# Patient Record
Sex: Female | Born: 1945
Health system: Southern US, Community
[De-identification: ages and names within clinical notes are randomized; demographics above are authoritative.]

## PROBLEM LIST (undated history)

## (undated) DIAGNOSIS — L570 Actinic keratosis: Secondary | ICD-10-CM

## (undated) DIAGNOSIS — K219 Gastro-esophageal reflux disease without esophagitis: Secondary | ICD-10-CM

## (undated) DIAGNOSIS — G8929 Other chronic pain: Secondary | ICD-10-CM

## (undated) DIAGNOSIS — E785 Hyperlipidemia, unspecified: Secondary | ICD-10-CM

## (undated) DIAGNOSIS — I10 Essential (primary) hypertension: Secondary | ICD-10-CM

## (undated) DIAGNOSIS — N289 Disorder of kidney and ureter, unspecified: Secondary | ICD-10-CM

## (undated) HISTORY — PX: ABDOMINAL HYSTERECTOMY: SHX81

## (undated) HISTORY — DX: Gastro-esophageal reflux disease without esophagitis: K21.9

## (undated) HISTORY — PX: CAROTID ARTERY ANGIOPLASTY: SHX1300

## (undated) HISTORY — PX: COLONOSCOPY: SHX174

## (undated) HISTORY — PX: AUGMENTATION MAMMAPLASTY: SUR837

## (undated) HISTORY — DX: Other chronic pain: G89.29

## (undated) HISTORY — PX: FRACTURE SURGERY: SHX138

## (undated) HISTORY — PX: APPENDECTOMY: SHX54

## (undated) HISTORY — PX: TUBAL LIGATION: SHX77

## (undated) HISTORY — DX: Essential (primary) hypertension: I10

## (undated) HISTORY — PX: ELBOW SURGERY: SHX618

## (undated) HISTORY — DX: Actinic keratosis: L57.0

## (undated) HISTORY — PX: TONSILLECTOMY: SUR1361

## (undated) HISTORY — DX: Hyperlipidemia, unspecified: E78.5

## (undated) HISTORY — PX: COSMETIC SURGERY: SHX468

---

## 1999-05-23 HISTORY — PX: CAROTID ENDARTERECTOMY: SUR193

## 2013-03-18 LAB — HM COLONOSCOPY

## 2017-05-28 DIAGNOSIS — S6991XA Unspecified injury of right wrist, hand and finger(s), initial encounter: Secondary | ICD-10-CM | POA: Diagnosis not present

## 2017-05-28 DIAGNOSIS — M151 Heberden's nodes (with arthropathy): Secondary | ICD-10-CM | POA: Diagnosis not present

## 2017-05-28 DIAGNOSIS — Z0000 Encounter for general adult medical examination without abnormal findings: Secondary | ICD-10-CM | POA: Diagnosis not present

## 2017-05-28 DIAGNOSIS — N179 Acute kidney failure, unspecified: Secondary | ICD-10-CM | POA: Diagnosis not present

## 2017-05-28 DIAGNOSIS — I119 Hypertensive heart disease without heart failure: Secondary | ICD-10-CM | POA: Diagnosis not present

## 2017-05-28 DIAGNOSIS — N183 Chronic kidney disease, stage 3 (moderate): Secondary | ICD-10-CM | POA: Diagnosis not present

## 2017-05-29 DIAGNOSIS — N183 Chronic kidney disease, stage 3 (moderate): Secondary | ICD-10-CM | POA: Diagnosis not present

## 2017-05-29 DIAGNOSIS — I119 Hypertensive heart disease without heart failure: Secondary | ICD-10-CM | POA: Diagnosis not present

## 2017-09-24 DIAGNOSIS — H35033 Hypertensive retinopathy, bilateral: Secondary | ICD-10-CM | POA: Diagnosis not present

## 2017-09-24 DIAGNOSIS — Z961 Presence of intraocular lens: Secondary | ICD-10-CM | POA: Diagnosis not present

## 2017-09-24 DIAGNOSIS — H43811 Vitreous degeneration, right eye: Secondary | ICD-10-CM | POA: Diagnosis not present

## 2017-09-25 DIAGNOSIS — I119 Hypertensive heart disease without heart failure: Secondary | ICD-10-CM | POA: Diagnosis not present

## 2017-09-25 DIAGNOSIS — N183 Chronic kidney disease, stage 3 (moderate): Secondary | ICD-10-CM | POA: Diagnosis not present

## 2017-09-25 DIAGNOSIS — E559 Vitamin D deficiency, unspecified: Secondary | ICD-10-CM | POA: Diagnosis not present

## 2017-09-25 DIAGNOSIS — N179 Acute kidney failure, unspecified: Secondary | ICD-10-CM | POA: Diagnosis not present

## 2017-10-11 DIAGNOSIS — L298 Other pruritus: Secondary | ICD-10-CM | POA: Diagnosis not present

## 2017-10-11 DIAGNOSIS — L820 Inflamed seborrheic keratosis: Secondary | ICD-10-CM | POA: Diagnosis not present

## 2017-10-11 DIAGNOSIS — L538 Other specified erythematous conditions: Secondary | ICD-10-CM | POA: Diagnosis not present

## 2017-10-11 DIAGNOSIS — L821 Other seborrheic keratosis: Secondary | ICD-10-CM | POA: Diagnosis not present

## 2017-10-19 DIAGNOSIS — H6123 Impacted cerumen, bilateral: Secondary | ICD-10-CM | POA: Diagnosis not present

## 2017-11-05 DIAGNOSIS — Z1231 Encounter for screening mammogram for malignant neoplasm of breast: Secondary | ICD-10-CM | POA: Diagnosis not present

## 2017-11-15 DIAGNOSIS — H61303 Acquired stenosis of external ear canal, unspecified, bilateral: Secondary | ICD-10-CM | POA: Diagnosis not present

## 2017-11-15 DIAGNOSIS — H900 Conductive hearing loss, bilateral: Secondary | ICD-10-CM | POA: Diagnosis not present

## 2017-11-15 DIAGNOSIS — H6123 Impacted cerumen, bilateral: Secondary | ICD-10-CM | POA: Diagnosis not present

## 2017-11-27 DIAGNOSIS — R928 Other abnormal and inconclusive findings on diagnostic imaging of breast: Secondary | ICD-10-CM | POA: Diagnosis not present

## 2017-11-27 DIAGNOSIS — R922 Inconclusive mammogram: Secondary | ICD-10-CM | POA: Diagnosis not present

## 2017-11-29 DIAGNOSIS — H6123 Impacted cerumen, bilateral: Secondary | ICD-10-CM | POA: Diagnosis not present

## 2017-11-30 DIAGNOSIS — L237 Allergic contact dermatitis due to plants, except food: Secondary | ICD-10-CM | POA: Diagnosis not present

## 2018-01-03 DIAGNOSIS — N183 Chronic kidney disease, stage 3 (moderate): Secondary | ICD-10-CM | POA: Diagnosis not present

## 2018-01-03 LAB — COMPREHENSIVE METABOLIC PANEL
Albumin: 4.2 (ref 3.5–5.0)
Calcium: 10 (ref 8.7–10.7)
GFR calc non Af Amer: 43.1

## 2018-01-03 LAB — TSH
TSH: 2.68 (ref 0.41–5.90)
TSH: 2.68 (ref 0.41–5.90)

## 2018-01-03 LAB — BASIC METABOLIC PANEL
BUN: 19 (ref 4–21)
BUN: 19 (ref 4–21)
CO2: 31 — AB (ref 13–22)
Chloride: 104 (ref 99–108)
Creatinine: 1.3 — AB (ref 0.5–1.1)
Creatinine: 1.3 — AB (ref 0.5–1.1)
Glucose: 106
Glucose: 106
Potassium: 4.8 (ref 3.4–5.3)
Potassium: 4.8 (ref 3.4–5.3)
Sodium: 146 (ref 137–147)
Sodium: 146 (ref 137–147)

## 2018-01-03 LAB — HEPATIC FUNCTION PANEL
ALT: 20 (ref 7–35)
ALT: 22 (ref 7–35)
AST: 29 (ref 13–35)
AST: 29 (ref 13–35)
Alkaline Phosphatase: 104 (ref 25–125)
Bilirubin, Total: 0.3
Bilirubin, Total: 0.3

## 2018-01-03 LAB — CBC AND DIFFERENTIAL
Hemoglobin: 14.3 (ref 12.0–16.0)
Hemoglobin: 14.3 (ref 12.0–16.0)

## 2018-01-03 LAB — VITAMIN D 25 HYDROXY (VIT D DEFICIENCY, FRACTURES)
Vit D, 25-Hydroxy: 40.9
Vit D, 25-Hydroxy: 40.9

## 2018-01-09 DIAGNOSIS — I119 Hypertensive heart disease without heart failure: Secondary | ICD-10-CM | POA: Diagnosis not present

## 2018-01-09 DIAGNOSIS — N179 Acute kidney failure, unspecified: Secondary | ICD-10-CM | POA: Diagnosis not present

## 2018-01-09 DIAGNOSIS — N183 Chronic kidney disease, stage 3 (moderate): Secondary | ICD-10-CM | POA: Diagnosis not present

## 2018-01-09 DIAGNOSIS — G894 Chronic pain syndrome: Secondary | ICD-10-CM | POA: Diagnosis not present

## 2018-05-30 DIAGNOSIS — L570 Actinic keratosis: Secondary | ICD-10-CM | POA: Diagnosis not present

## 2018-05-30 DIAGNOSIS — L578 Other skin changes due to chronic exposure to nonionizing radiation: Secondary | ICD-10-CM | POA: Diagnosis not present

## 2018-05-30 DIAGNOSIS — Z1283 Encounter for screening for malignant neoplasm of skin: Secondary | ICD-10-CM | POA: Diagnosis not present

## 2018-05-30 DIAGNOSIS — D229 Melanocytic nevi, unspecified: Secondary | ICD-10-CM | POA: Diagnosis not present

## 2018-06-04 ENCOUNTER — Ambulatory Visit: Payer: Medicare Other | Admitting: Nurse Practitioner

## 2018-06-06 ENCOUNTER — Ambulatory Visit
Admission: RE | Admit: 2018-06-06 | Discharge: 2018-06-06 | Disposition: A | Discharge status: Home / Self Care | Payer: Medicare Other | Source: Ambulatory Visit | Attending: Nurse Practitioner | Admitting: Nurse Practitioner

## 2018-06-06 ENCOUNTER — Ambulatory Visit: Payer: Medicare Other | Admitting: Nurse Practitioner

## 2018-06-06 ENCOUNTER — Encounter: Payer: Self-pay | Admitting: Nurse Practitioner

## 2018-06-06 ENCOUNTER — Other Ambulatory Visit: Payer: Self-pay

## 2018-06-06 VITALS — BP 141/67 | HR 67 | Temp 97.7°F | Ht 63.0 in | Wt 130.0 lb

## 2018-06-06 DIAGNOSIS — G8929 Other chronic pain: Secondary | ICD-10-CM

## 2018-06-06 DIAGNOSIS — M533 Sacrococcygeal disorders, not elsewhere classified: Secondary | ICD-10-CM | POA: Insufficient documentation

## 2018-06-06 DIAGNOSIS — M79602 Pain in left arm: Secondary | ICD-10-CM

## 2018-06-06 DIAGNOSIS — Z79899 Other long term (current) drug therapy: Secondary | ICD-10-CM

## 2018-06-06 DIAGNOSIS — M79605 Pain in left leg: Secondary | ICD-10-CM | POA: Insufficient documentation

## 2018-06-06 DIAGNOSIS — Z79891 Long term (current) use of opiate analgesic: Secondary | ICD-10-CM | POA: Insufficient documentation

## 2018-06-06 DIAGNOSIS — Z789 Other specified health status: Secondary | ICD-10-CM | POA: Diagnosis not present

## 2018-06-06 DIAGNOSIS — G894 Chronic pain syndrome: Secondary | ICD-10-CM | POA: Insufficient documentation

## 2018-06-06 DIAGNOSIS — M899 Disorder of bone, unspecified: Secondary | ICD-10-CM | POA: Insufficient documentation

## 2018-06-06 DIAGNOSIS — S4992XA Unspecified injury of left shoulder and upper arm, initial encounter: Secondary | ICD-10-CM | POA: Diagnosis not present

## 2018-06-06 DIAGNOSIS — M47816 Spondylosis without myelopathy or radiculopathy, lumbar region: Secondary | ICD-10-CM | POA: Diagnosis not present

## 2018-06-06 DIAGNOSIS — M25512 Pain in left shoulder: Secondary | ICD-10-CM | POA: Diagnosis not present

## 2018-06-06 NOTE — Patient Instructions (Signed)

## 2018-06-06 NOTE — Progress Notes (Signed)
Patient's Name: Dawn Bruce  MRN: [id number]  Referring Provider: Loney Hering*  DOB: [birth year]  PCP: [facility]  DOS: 06/06/2018  Note by: Dionisio David NP  Service setting: Ambulatory outpatient  Specialty: Interventional Pain Management  Location: [facility] (AMB) Pain Management Facility    Patient type: New Patient    Primary Reason(s) for Visit: Initial Patient Evaluation CC: Arm Pain (left)  HPI  Ms. Bartus is a 73 y.o. year old, female patient, who comes today for an initial evaluation. She has Chronic pain of left upper extremity (Primary Area of Pain); Chronic left sacroiliac joint pain (Secondary Area of Pain); Chronic pain of left lower extremity (Tertiary Area of Pain); Chronic pain syndrome; Long term current use of opiate analgesic; Pharmacologic therapy; Disorder of skeletal system; and Problems influencing health status on their problem list.. Her primarily concern today is the Arm Pain (left)  Pain Assessment: Location: Left Arm(left) Radiating: pain radiaties down to fingers Onset: More than a month ago Duration: Chronic pain Quality: Dull, Aching, Burning, Throbbing, Constant Severity: 2 /10 (subjective, self-reported pain score)  Note: Reported level is compatible with observation.                          Effect on ADL: limts my daily activities Timing: Constant Modifying factors: medication and patches BP: (!) 141/67  HR: 67  Onset and Duration: Date of onset: 01/20/2005 and Present longer than 3 months Cause of pain: Work related accident or event Severity: Getting better, NAS-11 at its worse: 8/10, NAS-11 at its best: 2/10, NAS-11 now: 2/10 and NAS-11 on the average: 2/10 Timing: Not influenced by the time of the day and During activity or exercise Aggravating Factors: Prolonged sitting, Prolonged standing and Walking uphill Alleviating Factors: Medications Associated Problems: Night-time cramps, Numbness, Sweating, Temperature changes and  Pain that does not allow patient to sleep Quality of Pain: Burning and Constant Previous Examinations or Tests: X-rays, Nerve conduction test and Orthopedic evaluation Previous Treatments: Narcotic medications, Physical Therapy and Pool exercises  The patient comes into the clinics today for the first time for a chronic pain management evaluation.  According to the patient her primary area of pain is in her left forearm.  She admits this is related to an accident that she suffered 14 years ago.  She admits that she did have multiple surgeries on her arm.  She was seen at [facility] pain management initially prescribed Lyrica which was effective.  She later moved to [facility] and was started on fentanyl patches.  She admits that she has numbness and tingling with weakness that goes to all of her fingers.  Her second area of pain is in her left buttocks.  She feels like this pain has gotten worse over the last month.  She denies any previous surgery, interventional therapy physical therapy or recent images.  Her third area of pain is in her left leg.  She admits that she has numbness and tingling that goes all the way down the back of her leg into her whole foot and affects all of her toes.  She denies weakness.  She denies a nerve conduction study.  Today I took the time to provide the patient with information regarding this pain practice. The patient was informed that the practice is divided into two sections: an interventional pain management section, as well as a completely separate and distinct medication management section. I explained that there are procedure days  for interventional therapies, and evaluation days for follow-ups and medication management. Because of the amount of documentation required during both, they are kept separated. This means that there is the possibility that she may be scheduled for a procedure on one day, and medication management the next. I have also informed her that because  of staffing and facility limitations, this practice will no longer take patients for medication management only. To illustrate the reasons for this, I gave the patient the example of surgeons, and how inappropriate it would be to refer a patient to his/her care, just to write for the post-surgical antibiotics on a surgery done by a different surgeon.   Because interventional pain management is part of the board-certified specialty for the doctors, the patient was informed that joining this practice means that they are open to any and all interventional therapies. I made it clear that this does not mean that they will be forced to have any procedures done. What this means is that I believe interventional therapies to be essential part of the diagnosis and proper management of chronic pain conditions. Therefore, patients not interested in these interventional alternatives will be better served under the care of a different practitioner.  The patient was also made aware of my Comprehensive Pain Management Safety Guidelines where by joining this practice, they limit all of their nerve blocks and joint injections to those done by our practice, for as long as we are retained to manage their care. Historic Controlled Substance Pharmacotherapy Review  PMP and historical list of controlled substances: Fentanyl 50 mcg patch, pregabalin 150 mg, zolpidem 10 mg Highest opioid analgesic regimen found: Fentanyl 50 mcg patch every 72 hours (last fill date 05/31/2018) Most recent opioid analgesic: Fentanyl 50 mcg patch every 72 hours (last fill date 05/31/2018) Current opioid analgesics:Fentanyl 50 mcg patch every 72 hours (last fill date 05/31/2018) Highest recorded MME/day: 120 mg/day MME/day: 120 mg/day Medications: The patient did not bring the medication(s) to the appointment, as requested in our "New Patient Package" Pharmacodynamics: Desired effects: Analgesia: The patient reports >50% benefit. Reported  improvement in function: The patient reports medication allows her to accomplish basic ADLs. Clinically meaningful improvement in function (CMIF): Sustained CMIF goals met Perceived effectiveness: Described as relatively effective, allowing for increase in activities of daily living (ADL) Undesirable effects: Side-effects or Adverse reactions: None reported Historical Monitoring: The patient  has no history on file for drug. List of all UDS Test(s): No results found for: MDMA, COCAINSCRNUR, PCPSCRNUR, PCPQUANT, CANNABQUANT, THCU, [place] List of all Serum Drug Screening Test(s):  No results found for: AMPHSCRSER, BARBSCRSER, BENZOSCRSER, COCAINSCRSER, PCPSCRSER, PCPQUANT, THCSCRSER, CANNABQUANT, OPIATESCRSER, OXYSCRSER, PROPOXSCRSER Historical Background Evaluation: [place] PDMP: Six (6) year initial data search conducted.             [place] Department of public safety, offender search: Editor, commissioning Information) Non-contributory Risk Assessment Profile: Aberrant behavior: None observed or detected today Risk factors for fatal opioid overdose: None identified today Fatal overdose hazard ratio (HR): Calculation deferred Non-fatal overdose hazard ratio (HR): Calculation deferred Risk of opioid abuse or dependence: 0.7-3.0% with doses ? 36 MME/day and 6.1-26% with doses ? 120 MME/day. Substance use disorder (SUD) risk level: Pending results of Medical Psychology Evaluation for SUD Opioid risk tool (ORT) (Total Score): 0  ORT Scoring interpretation table:  Score <3 = Low Risk for SUD  Score between 4-7 = Moderate Risk for SUD  Score >8 = High Risk for Opioid Abuse   PHQ-2 Depression Scale:  Total  score:    PHQ-2 Scoring interpretation table: (Score and probability of major depressive disorder)  Score 0 = No depression  Score 1 = 15.4% Probability  Score 2 = 21.1% Probability  Score 3 = 38.4% Probability  Score 4 = 45.5% Probability  Score 5 = 56.4% Probability  Score 6 = 78.6% Probability   PHQ-9  Depression Scale:  Total score:    PHQ-9 Scoring interpretation table:  Score 0-4 = No depression  Score 5-9 = Mild depression  Score 10-14 = Moderate depression  Score 15-19 = Moderately severe depression  Score 20-27 = Severe depression (2.4 times higher risk of SUD and 2.89 times higher risk of overuse)   Pharmacologic Plan: Pending ordered tests and/or consults  Meds  The patient has a current medication list which includes the following prescription(s): aspirin, atorvastatin, diltiazem, escitalopram, fentanyl, omeprazole, and pregabalin.  Current Outpatient Medications on File Prior to Visit  Medication Sig  . aspirin 81 MG chewable tablet Chew by mouth daily.  Marland Kitchen atorvastatin (LIPITOR) 40 MG tablet Take 40 mg by mouth daily.  Marland Kitchen diltiazem (CARDIZEM SR) 120 MG 12 hr capsule Take 120 mg by mouth 2 (two) times daily.  Marland Kitchen escitalopram (LEXAPRO) 10 MG tablet Take 10 mg by mouth daily.  . fentaNYL (DURAGESIC) 50 MCG/HR Place onto the skin every 3 (three) days.  Marland Kitchen omeprazole (PRILOSEC) 40 MG capsule Take 40 mg by mouth daily.  . pregabalin (LYRICA) 150 MG capsule Take 150 mg by mouth 2 (two) times daily.   No current facility-administered medications on file prior to visit.    Imaging Review  Note: No new results found.        ROS  Cardiovascular History: Daily Aspirin intake and High blood pressure Pulmonary or Respiratory History: No reported pulmonary signs or symptoms such as wheezing and difficulty taking a deep full breath (Asthma), difficulty blowing air out (Emphysema), coughing up mucus (Bronchitis), persistent dry cough, or temporary stoppage of breathing during sleep Neurological History: No reported neurological signs or symptoms such as seizures, abnormal skin sensations, urinary and/or fecal incontinence, being born with an abnormal open spine and/or a tethered spinal cord Review of Past Neurological Studies: No results found for this or any previous  visit. Psychological-Psychiatric History: No reported psychological or psychiatric signs or symptoms such as difficulty sleeping, anxiety, depression, delusions or hallucinations (schizophrenial), mood swings (bipolar disorders) or suicidal ideations or attempts Gastrointestinal History: Reflux or heatburn Genitourinary History: No reported renal or genitourinary signs or symptoms such as difficulty voiding or producing urine, peeing blood, non-functioning kidney, kidney stones, difficulty emptying the bladder, difficulty controlling the flow of urine, or chronic kidney disease Hematological History: Brusing easily Endocrine History: No reported endocrine signs or symptoms such as high or low blood sugar, rapid heart rate due to high thyroid levels, obesity or weight gain due to slow thyroid or thyroid disease Rheumatologic History: No reported rheumatological signs and symptoms such as fatigue, joint pain, tenderness, swelling, redness, heat, stiffness, decreased range of motion, with or without associated rash Musculoskeletal History: Negative for myasthenia gravis, muscular dystrophy, multiple sclerosis or malignant hyperthermia Work History: Retired  Allergies  Ms. Doane has no allergies on file.  Laboratory Chemistry  Inflammation Markers No results found for: CRP, ESRSEDRATE (CRP: Acute Phase) (ESR: Chronic Phase) Renal Function Markers No results found for: BUN, CREATININE, GFRAA, GFRNONAA Hepatic Function Markers No results found for: AST, ALT, ALBUMIN, ALKPHOS, HCVAB Electrolytes No results found for: NA, K, CL, CALCIUM, MG Neuropathy Markers  No results found for: [facility] Bone Pathology Markers No results found for: Hendricks Milo, VD125OH2TOT, [id number], [id number], 25OHVITD1, 25OHVITD2, 25OHVITD3, CALCIUM, TESTOFREE, TESTOSTERONE Coagulation Parameters No results found for: INR, LABPROT, APTT, PLT Cardiovascular Markers No results found for: BNP, HGB, HCT Note: Lab  results reviewed.  PFSH  Drug: Ms. Cartaya  has no history on file for drug. Alcohol:  has no history on file for alcohol. Tobacco:  reports that she has never smoked. She has never used smokeless tobacco. Medical:  has a past medical history of Hypertension. Family: family history includes Cancer in her father and mother.   Active Ambulatory Problems    Diagnosis Date Noted  . Chronic pain of left upper extremity (Primary Area of Pain) 06/06/2018  . Chronic left sacroiliac joint pain (Secondary Area of Pain) 06/06/2018  . Chronic pain of left lower extremity (Tertiary Area of Pain) 06/06/2018  . Chronic pain syndrome 06/06/2018  . Long term current use of opiate analgesic 06/06/2018  . Pharmacologic therapy 06/06/2018  . Disorder of skeletal system 06/06/2018  . Problems influencing health status 06/06/2018   Resolved Ambulatory Problems    Diagnosis Date Noted  . No Resolved Ambulatory Problems   Past Medical History:  Diagnosis Date  . Hypertension    Constitutional Exam  General appearance: Well nourished, well developed, and well hydrated. In no apparent acute distress Vitals:   06/06/18 0849  BP: (!) 141/67  Pulse: 67  Temp: 97.7 F (36.5 C)  SpO2: 99%  Weight: 130 lb (59 kg)  Height: 5' 3"  (1.6 m)   BMI Assessment: Estimated body mass index is 23.03 kg/m as calculated from the following:   Height as of this encounter: 5' 3"  (1.6 m).   Weight as of this encounter: 130 lb (59 kg).  BMI interpretation table: BMI level Category Range association with higher incidence of chronic pain  <18 kg/m2 Underweight   18.5-24.9 kg/m2 Ideal body weight   25-29.9 kg/m2 Overweight Increased incidence by 20%  30-34.9 kg/m2 Obese (Class I) Increased incidence by 68%  35-39.9 kg/m2 Severe obesity (Class II) Increased incidence by 136%  >40 kg/m2 Extreme obesity (Class III) Increased incidence by 254%   BMI Readings from Last 4 Encounters:  06/06/18 23.03 kg/m   Wt Readings  from Last 4 Encounters:  06/06/18 130 lb (59 kg)  Psych/Mental status: Alert, oriented x 3 (person, place, & time)       Eyes: PERLA Respiratory: No evidence of acute respiratory distress  Cervical Spine Exam  Inspection: No masses, redness, or swelling Alignment: Symmetrical Functional ROM: Unrestricted ROM      Stability: No instability detected Muscle strength & Tone: Functionally intact Sensory: Unimpaired Palpation: No palpable anomalies              Upper Extremity (UE) Exam    Side: Right upper extremity  Side: Left upper extremity  Inspection: No masses, redness, swelling, or asymmetry. No contractures  Inspection: Well-healed scar from previous surgery  Functional ROM: Unrestricted ROM          Functional ROM: Unrestricted ROM          Muscle strength & Tone: Functionally intact  Muscle strength & Tone: Normal strength (5/5)  Sensory: Unimpaired  Sensory: Unimpaired  Palpation: No palpable anomalies              Palpation: Non-tender              Specialized Test(s): Deferred  Specialized Test(s): Deferred          Thoracic Spine Exam  Inspection: No masses, redness, or swelling Alignment: Symmetrical Functional ROM: Unrestricted ROM Stability: No instability detected Sensory: Unimpaired Muscle strength & Tone: No palpable anomalies  Lumbar Spine Exam  Inspection: No masses, redness, or swelling Alignment: Symmetrical Functional ROM: Unrestricted ROM      Stability: No instability detected Muscle strength & Tone: Functionally intact Sensory: Unimpaired Palpation: No palpable anomalies       Provocative Tests: Lumbar Hyperextension and rotation test: evaluation deferred today       Patrick's Maneuver: Positive for left-sided S-I arthralgia              Gait & Posture Assessment  Ambulation: Unassisted Gait: Relatively normal for age and body habitus Posture: WNL   Lower Extremity Exam    Side: Right lower extremity  Side: Left lower extremity   Inspection: No masses, redness, swelling, or asymmetry. No contractures  Inspection: No masses, redness, swelling, or asymmetry. No contractures  Functional ROM: Unrestricted ROM          Functional ROM: Unrestricted ROM          Muscle strength & Tone: Able to Toe-walk & Heel-walk without problems  Muscle strength & Tone: Able to Toe-walk & Heel-walk without problems  Sensory: Unimpaired  Sensory: Dermatomal pain pattern  Palpation: No palpable anomalies  Palpation: No palpable anomalies   Assessment  Primary Diagnosis & Pertinent Problem List: The primary encounter diagnosis was Chronic pain of left upper extremity (Primary Area of Pain). Diagnoses of Chronic left sacroiliac joint pain (Secondary Area of Pain), Chronic pain of left lower extremity (Tertiary Area of Pain), Chronic pain syndrome, Long term current use of opiate analgesic, Pharmacologic therapy, Disorder of skeletal system, and Problems influencing health status were also pertinent to this visit.  Visit Diagnosis: 1. Chronic pain of left upper extremity (Primary Area of Pain)   2. Chronic left sacroiliac joint pain (Secondary Area of Pain)   3. Chronic pain of left lower extremity (Tertiary Area of Pain)   4. Chronic pain syndrome   5. Long term current use of opiate analgesic   6. Pharmacologic therapy   7. Disorder of skeletal system   8. Problems influencing health status    Plan of Care  Initial treatment plan:  Please be advised that as per protocol, today's visit has been an evaluation only. We have not taken over the patient's controlled substance management.  Problem-specific plan: No problem-specific Assessment & Plan notes found for this encounter.  Ordered Lab-work, Procedure(s), Referral(s), & Consult(s): Orders Placed This Encounter  Procedures  . DG Lumbar Spine Complete W/Bend  . DG Si Joints  . DG Forearm Left  . Compliance Drug Analysis, Ur  . Comp. Metabolic Panel (12)  . Magnesium  . Vitamin  B12  . Sedimentation rate  . 25-Hydroxyvitamin D Lcms D2+D3  . C-reactive protein  . Ambulatory referral to Physical Therapy   Pharmacotherapy: Medications ordered:  No orders of the defined types were placed in this encounter.  Medications administered during this visit: [name] had no medications administered during this visit.   Pharmacotherapy under consideration:  Opioid Analgesics: The patient was informed that there is no guarantee that she would be a candidate for opioid analgesics. The decision will be made following CDC guidelines. This decision will be based on the results of diagnostic studies, as well as Ms. Humann's risk profile.  Membrane stabilizer: To be determined  at a later time Muscle relaxant: To be determined at a later time NSAID: To be determined at a later time Other analgesic(s): To be determined at a later time   Interventional therapies under consideration: Ms. Frein was informed that there is no guarantee that she would be a candidate for interventional therapies. The decision will be based on the results of diagnostic studies, as well as Ms. Eastmond's risk profile.  Possible procedure(s): Diagnostic left-sided sacroiliac joint injection Diagnostic left-sided lumbar epidural steroid injection Possible left-sided sacroiliac joint radiofrequency ablation   Provider-requested follow-up: Return for 2nd Visit, w/ Dr. Dossie Arbour, medical record release.  Future Appointments  Date Time Provider Orient  06/19/2018  8:30 AM Milinda Pointer, MD [facility] None  06/20/2018  1:00 PM Geralyn Corwin, PT [facility] None  09/04/2018  2:00 PM Bacigalupo, Dionne Bucy, MD [facility] None    Primary Care Physician: [place] Location: [facility] Outpatient Pain Management Facility Note by:  Date: 06/06/2018; Time: 12:10 PM  Pain Score Disclaimer: We use the NRS-11 scale. This is a self-reported, subjective measurement of pain severity with only modest  accuracy. It is used primarily to identify changes within a particular patient. It must be understood that outpatient pain scales are significantly less accurate that those used for research, where they can be applied under ideal controlled circumstances with minimal exposure to variables. In reality, the score is likely to be a combination of pain intensity and pain affect, where pain affect describes the degree of emotional arousal or changes in action readiness caused by the sensory experience of pain. Factors such as social and work situation, setting, emotional state, anxiety levels, expectation, and prior pain experience may influence pain perception and show large inter-individual differences that may also be affected by time variables.  Patient instructions provided during this appointment: Patient Instructions   ____________________________________________________________________________________________  Appointment Policy Summary  It is our goal and responsibility to provide the medical community with assistance in the evaluation and management of patients with chronic pain. Unfortunately our resources are limited. Because we do not have an unlimited amount of time, or available appointments, we are required to closely monitor and manage their use. The following rules exist to maximize their use:  Patient's responsibilities: 1. Punctuality:  At what time should I arrive? You should be physically present in our office 30 minutes before your scheduled appointment. Your scheduled appointment is with your assigned healthcare provider. However, it takes 5-10 minutes to be "checked-in", and another 15 minutes for the nurses to do the admission. If you arrive to our office at the time you were given for your appointment, you will end up being at least 20-25 minutes late to your appointment with the provider. 2. Tardiness:  What happens if I arrive only a few minutes after my scheduled appointment  time? You will need to reschedule your appointment. The cutoff is your appointment time. This is why it is so important that you arrive at least 30 minutes before that appointment. If you have an appointment scheduled for 10:00 AM and you arrive at 10:01, you will be required to reschedule your appointment.  3. Plan ahead:  Always assume that you will encounter traffic on your way in. Plan for it. If you are dependent on a driver, make sure they understand these rules and the need to arrive early. 4. Other appointments and responsibilities:  Avoid scheduling any other appointments before or after your pain clinic appointments.  5. Be prepared:  Write  down everything that you need to discuss with your healthcare provider and give this information to the admitting nurse. Write down the medications that you will need refilled. Bring your pills and bottles (even the empty ones), to all of your appointments, except for those where a procedure is scheduled. 6. No children or pets:  Find someone to take care of them. It is not appropriate to bring them in. 7. Scheduling changes:  We request "advanced notification" of any changes or cancellations. 8. Advanced notification:  Defined as a time period of more than 24 hours prior to the originally scheduled appointment. This allows for the appointment to be offered to other patients. 9. Rescheduling:  When a visit is rescheduled, it will require the cancellation of the original appointment. For this reason they both fall within the category of "Cancellations".  10. Cancellations:  They require advanced notification. Any cancellation less than 24 hours before the  appointment will be recorded as a "No Show". 11. No Show:  Defined as an unkept appointment where the patient failed to notify or declare to the practice their intention or inability to keep the appointment.  Corrective process for repeat offenders:  1. Tardiness: Three (3) episodes of rescheduling  due to late arrivals will be recorded as one (1) "No Show". 2. Cancellation or reschedule: Three (3) cancellations or rescheduling will be recorded as one (1) "No Show". 3. "No Shows": Three (3) "No Shows" within a 12 month period will result in discharge from the practice. ____________________________________________________________________________________________   ______________________________________________________________________________________________  Specialty Pain Scale  Introduction:  There are significant differences in how pain is reported. The word pain usually refers to physical pain, but it is also a common synonym of suffering. The medical community uses a scale from 0 (zero) to 10 (ten) to report pain level. Zero (0) is described as "no pain", while ten (10) is described as "the worse pain you can imagine". The problem with this scale is that physical pain is reported along with suffering. Suffering refers to mental pain, or more often yet it refers to any unpleasant feeling, emotion or aversion associated with the perception of harm or threat of harm. It is the psychological component of pain.  Pain Specialists prefer to separate the two components. The pain scale used by this practice is the Verbal Numerical Rating Scale (VNRS-11). This scale is for the physical pain only. DO NOT INCLUDE how your pain psychologically affects you. This scale is for adults 43 years of age and older. It has 11 (eleven) levels. The 1st level is 0/10. This means: "right now, I have no pain". In the context of pain management, it also means: "right now, my physical pain is under control with the current therapy".  General Information:  The scale should reflect your current level of pain. Unless you are specifically asked for the level of your worst pain, or your average pain. If you are asked for one of these two, then it should be understood that it is over the past 24 hours.  Levels 1 (one) through  5 (five) are described below, and can be treated as an outpatient. Ambulatory pain management facilities such as ours are more than adequate to treat these levels. Levels 6 (six) through 10 (ten) are also described below, however, these must be treated as a hospitalized patient. While levels 6 (six) and 7 (seven) may be evaluated at an urgent care facility, levels 8 (eight) through 10 (ten) constitute medical emergencies and as such,  they belong in a hospital's emergency department. When having these levels (as described below), do not come to our office. Our facility is not equipped to manage these levels. Go directly to an urgent care facility or an emergency department to be evaluated.  Definitions:  Activities of Daily Living (ADL): Activities of daily living (ADL or ADLs) is a term used in healthcare to refer to people's daily self-care activities. Health professionals often use a person's ability or inability to perform ADLs as a measurement of their functional status, particularly in regard to people post injury, with disabilities and the elderly. There are two ADL levels: Basic and Instrumental. Basic Activities of Daily Living (BADL  or BADLs) consist of self-care tasks that include: Bathing and showering; personal hygiene and grooming (including brushing/combing/styling hair); dressing; Toilet hygiene (getting to the toilet, cleaning oneself, and getting back up); eating and self-feeding (not including cooking or chewing and swallowing); functional mobility, often referred to as "transferring", as measured by the ability to walk, get in and out of bed, and get into and out of a chair; the broader definition (moving from one place to another while performing activities) is useful for people with different physical abilities who are still able to get around independently. Basic ADLs include the things many people do when they get up in the morning and get ready to go out of the house: get out of bed,  go to the toilet, bathe, dress, groom, and eat. On the average, loss of function typically follows a particular order. Hygiene is the first to go, followed by loss of toilet use and locomotion. The last to go is the ability to eat. When there is only one remaining area in which the person is independent, there is a 62.9% chance that it is eating and only a 3.5% chance that it is hygiene. Instrumental Activities of Daily Living (IADL or IADLs) are not necessary for fundamental functioning, but they let an individual live independently in a community. IADL consist of tasks that include: cleaning and maintaining the house; home establishment and maintenance; care of others (including selecting and supervising caregivers); care of pets; child rearing; managing money; managing financials (investments, etc.); meal preparation and cleanup; shopping for groceries and necessities; moving within the community; safety procedures and emergency responses; health management and maintenance (taking prescribed medications); and using the telephone or other form of communication.  Instructions:  Most patients tend to report their pain as a combination of two factors, their physical pain and their psychosocial pain. This last one is also known as "suffering" and it is reflection of how physical pain affects you socially and psychologically. From now on, report them separately.  From this point on, when asked to report your pain level, report only your physical pain. Use the following table for reference.  Pain Clinic Pain Levels (0-5/10)  Pain Level Score  Description  No Pain 0   Mild pain 1 Nagging, annoying, but does not interfere with basic activities of daily living (ADL). Patients are able to eat, bathe, get dressed, toileting (being able to get on and off the toilet and perform personal hygiene functions), transfer (move in and out of bed or a chair without assistance), and maintain continence (able to control  bladder and bowel functions). Blood pressure and heart rate are unaffected. A normal heart rate for a healthy adult ranges from 60 to 100 bpm (beats per minute).   Mild to moderate pain 2 Noticeable and distracting. Impossible to hide from  other people. More frequent flare-ups. Still possible to adapt and function close to normal. It can be very annoying and may have occasional stronger flare-ups. With discipline, patients may get used to it and adapt.   Moderate pain 3 Interferes significantly with activities of daily living (ADL). It becomes difficult to feed, bathe, get dressed, get on and off the toilet or to perform personal hygiene functions. Difficult to get in and out of bed or a chair without assistance. Very distracting. With effort, it can be ignored when deeply involved in activities.   Moderately severe pain 4 Impossible to ignore for more than a few minutes. With effort, patients may still be able to manage work or participate in some social activities. Very difficult to concentrate. Signs of autonomic nervous system discharge are evident: dilated pupils (mydriasis); mild sweating (diaphoresis); sleep interference. Heart rate becomes elevated (>115 bpm). Diastolic blood pressure (lower number) rises above 100 mmHg. Patients find relief in laying down and not moving.   Severe pain 5 Intense and extremely unpleasant. Associated with frowning face and frequent crying. Pain overwhelms the senses.  Ability to do any activity or maintain social relationships becomes significantly limited. Conversation becomes difficult. Pacing back and forth is common, as getting into a comfortable position is nearly impossible. Pain wakes you up from deep sleep. Physical signs will be obvious: pupillary dilation; increased sweating; goosebumps; brisk reflexes; cold, clammy hands and feet; nausea, vomiting or dry heaves; loss of appetite; significant sleep disturbance with inability to fall asleep or to remain  asleep. When persistent, significant weight loss is observed due to the complete loss of appetite and sleep deprivation.  Blood pressure and heart rate becomes significantly elevated. Caution: If elevated blood pressure triggers a pounding headache associated with blurred vision, then the patient should immediately seek attention at an urgent or emergency care unit, as these may be signs of an impending stroke.    Emergency Department Pain Levels (6-10/10)  Emergency Room Pain 6 Severely limiting. Requires emergency care and should not be seen or managed at an outpatient pain management facility. Communication becomes difficult and requires great effort. Assistance to reach the emergency department may be required. Facial flushing and profuse sweating along with potentially dangerous increases in heart rate and blood pressure will be evident.   Distressing pain 7 Self-care is very difficult. Assistance is required to transport, or use restroom. Assistance to reach the emergency department will be required. Tasks requiring coordination, such as bathing and getting dressed become very difficult.   Disabling pain 8 Self-care is no longer possible. At this level, pain is disabling. The individual is unable to do even the most "basic" activities such as walking, eating, bathing, dressing, transferring to a bed, or toileting. Fine motor skills are lost. It is difficult to think clearly.   Incapacitating pain 9 Pain becomes incapacitating. Thought processing is no longer possible. Difficult to remember your own name. Control of movement and coordination are lost.   The worst pain imaginable 10 At this level, most patients pass out from pain. When this level is reached, collapse of the autonomic nervous system occurs, leading to a sudden drop in blood pressure and heart rate. This in turn results in a temporary and dramatic drop in blood flow to the brain, leading to a loss of consciousness. Fainting is one of  the body's self defense mechanisms. Passing out puts the brain in a calmed state and causes it to shut down for a while, in order to  begin the healing process.    Summary: 1. Refer to this scale when providing [place] with your pain level. 2. Be accurate and careful when reporting your pain level. This will help with your care. 3. Over-reporting your pain level will lead to loss of credibility. 4. Even a level of 1/10 means that there is pain and will be treated at our facility. 5. High, inaccurate reporting will be documented as "Symptom Exaggeration", leading to loss of credibility and suspicions of possible secondary gains such as obtaining more narcotics, or wanting to appear disabled, for fraudulent reasons. 6. Only pain levels of 5 or below will be seen at our facility. 7. Pain levels of 6 and above will be sent to the Emergency Department and the appointment cancelled. ______________________________________________________________________________________________

## 2018-06-10 LAB — COMP. METABOLIC PANEL (12)
AST: 34 IU/L (ref 0–40)
Albumin/Globulin Ratio: 1.7 (ref 1.2–2.2)
Albumin: 4.3 g/dL (ref 3.5–4.8)
Alkaline Phosphatase: 93 IU/L (ref 39–117)
BUN/Creatinine Ratio: 17 (ref 12–28)
BUN: 24 mg/dL (ref 8–27)
Bilirubin Total: 0.4 mg/dL (ref 0.0–1.2)
Calcium: 10.1 mg/dL (ref 8.7–10.3)
Chloride: 103 mmol/L (ref 96–106)
Creatinine, Ser: 1.45 mg/dL — ABNORMAL HIGH (ref 0.57–1.00)
GFR calc Af Amer: 41 mL/min/{1.73_m2} — ABNORMAL LOW (ref >59)
GFR calc non Af Amer: 36 mL/min/{1.73_m2} — ABNORMAL LOW (ref >59)
Globulin, Total: 2.5 g/dL (ref 1.5–4.5)
Glucose: 91 mg/dL (ref 65–99)
Potassium: 4.4 mmol/L (ref 3.5–5.2)
Sodium: 144 mmol/L (ref 134–144)
Total Protein: 6.8 g/dL (ref 6.0–8.5)

## 2018-06-10 LAB — MAGNESIUM: Magnesium: 2 mg/dL (ref 1.6–2.3)

## 2018-06-10 LAB — 25-HYDROXY VITAMIN D LCMS D2+D3
25-Hydroxy, Vitamin D-2: 3.3 ng/mL
25-Hydroxy, Vitamin D-3: 42 ng/mL
25-Hydroxy, Vitamin D: 45 ng/mL

## 2018-06-10 LAB — C-REACTIVE PROTEIN: CRP: 3 mg/L (ref 0–10)

## 2018-06-10 LAB — SEDIMENTATION RATE: Sed Rate: 8 mm/hr (ref 0–40)

## 2018-06-10 LAB — VITAMIN B12: Vitamin B-12: 444 pg/mL (ref 232–1245)

## 2018-06-11 NOTE — Progress Notes (Signed)
Results were reviewed and found to be: mildly abnormal  No acute injury or pathology identified  Review would suggest interventional pain management techniques may be of benefit 

## 2018-06-12 LAB — COMPLIANCE DRUG ANALYSIS, UR

## 2018-06-13 ENCOUNTER — Other Ambulatory Visit: Payer: Self-pay

## 2018-06-13 NOTE — Progress Notes (Signed)
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  Diagnostic left-sided sacroiliac joint injection  Diagnostic left-sided L4 transforaminal ESI  Diagnostic left-sided L4-5 LESI  Possible left-sided sacroiliac joint RFA    PRN Procedures:   None at this time   Provider-requested follow-up: Return for PRN Procedure (w/ sedation): (L) L4-5 LESI #1, F/U eval (after test completion).  Future Appointments  Date Time Provider [place]  09/04/2018  2:00 PM Bacigalupo, Dionne Bucy, MD [facility] None    Primary Care Physician: [place] Location: [facility] Note by: Gaspar Cola, MD Date: 06/17/2018; Time: 11:34 AM

## 2018-06-17 ENCOUNTER — Ambulatory Visit: Payer: Medicare Other | Attending: Pain Medicine | Admitting: Pain Medicine

## 2018-06-17 ENCOUNTER — Other Ambulatory Visit: Payer: Self-pay

## 2018-06-17 ENCOUNTER — Encounter: Payer: Self-pay | Admitting: Pain Medicine

## 2018-06-17 VITALS — BP 185/71 | HR 65 | Temp 97.7°F | Resp 18 | Ht 63.0 in | Wt 130.0 lb

## 2018-06-17 DIAGNOSIS — M533 Sacrococcygeal disorders, not elsewhere classified: Secondary | ICD-10-CM | POA: Diagnosis not present

## 2018-06-17 DIAGNOSIS — M79602 Pain in left arm: Secondary | ICD-10-CM

## 2018-06-17 DIAGNOSIS — M5136 Other intervertebral disc degeneration, lumbar region: Secondary | ICD-10-CM | POA: Diagnosis not present

## 2018-06-17 DIAGNOSIS — M4125 Other idiopathic scoliosis, thoracolumbar region: Secondary | ICD-10-CM | POA: Diagnosis not present

## 2018-06-17 DIAGNOSIS — M792 Neuralgia and neuritis, unspecified: Secondary | ICD-10-CM

## 2018-06-17 DIAGNOSIS — M47816 Spondylosis without myelopathy or radiculopathy, lumbar region: Secondary | ICD-10-CM

## 2018-06-17 DIAGNOSIS — Z79891 Long term (current) use of opiate analgesic: Secondary | ICD-10-CM | POA: Diagnosis present

## 2018-06-17 DIAGNOSIS — M79605 Pain in left leg: Secondary | ICD-10-CM

## 2018-06-17 DIAGNOSIS — G8929 Other chronic pain: Secondary | ICD-10-CM | POA: Insufficient documentation

## 2018-06-17 DIAGNOSIS — M5442 Lumbago with sciatica, left side: Secondary | ICD-10-CM

## 2018-06-17 DIAGNOSIS — M5417 Radiculopathy, lumbosacral region: Secondary | ICD-10-CM | POA: Diagnosis not present

## 2018-06-17 DIAGNOSIS — R202 Paresthesia of skin: Secondary | ICD-10-CM | POA: Diagnosis not present

## 2018-06-17 DIAGNOSIS — N183 Chronic kidney disease, stage 3 unspecified: Secondary | ICD-10-CM | POA: Insufficient documentation

## 2018-06-17 DIAGNOSIS — M899 Disorder of bone, unspecified: Secondary | ICD-10-CM

## 2018-06-17 DIAGNOSIS — M4310 Spondylolisthesis, site unspecified: Secondary | ICD-10-CM | POA: Diagnosis not present

## 2018-06-17 DIAGNOSIS — N289 Disorder of kidney and ureter, unspecified: Secondary | ICD-10-CM

## 2018-06-17 DIAGNOSIS — M79632 Pain in left forearm: Secondary | ICD-10-CM | POA: Insufficient documentation

## 2018-06-17 DIAGNOSIS — G894 Chronic pain syndrome: Secondary | ICD-10-CM

## 2018-06-17 DIAGNOSIS — R200 Anesthesia of skin: Secondary | ICD-10-CM

## 2018-06-17 DIAGNOSIS — Z789 Other specified health status: Secondary | ICD-10-CM | POA: Diagnosis present

## 2018-06-17 DIAGNOSIS — Z79899 Other long term (current) drug therapy: Secondary | ICD-10-CM | POA: Diagnosis present

## 2018-06-17 NOTE — Patient Instructions (Addendum)
______________________________________________________________________________________________  Specialty Pain Scale  Introduction:  There are significant differences in how pain is reported. The word pain usually refers to physical pain, but it is also a common synonym of suffering. The medical community uses a scale from 0 (zero) to 10 (ten) to report pain level. Zero (0) is described as "no pain", while ten (10) is described as "the worse pain you can imagine". The problem with this scale is that physical pain is reported along with suffering. Suffering refers to mental pain, or more often yet it refers to any unpleasant feeling, emotion or aversion associated with the perception of harm or threat of harm. It is the psychological component of pain.  Pain Specialists prefer to separate the two components. The pain scale used by this practice is the Verbal Numerical Rating Scale (VNRS-11). This scale is for the physical pain only. DO NOT INCLUDE how your pain psychologically affects you. This scale is for adults 21 years of age and older. It has 11 (eleven) levels. The 1st level is 0/10. This means: "right now, I have no pain". In the context of pain management, it also means: "right now, my physical pain is under control with the current therapy".  General Information:  The scale should reflect your current level of pain. Unless you are specifically asked for the level of your worst pain, or your average pain. If you are asked for one of these two, then it should be understood that it is over the past 24 hours.  Levels 1 (one) through 5 (five) are described below, and can be treated as an outpatient. Ambulatory pain management facilities such as ours are more than adequate to treat these levels. Levels 6 (six) through 10 (ten) are also described below, however, these must be treated as a hospitalized patient. While levels 6 (six) and 7 (seven) may be evaluated at an urgent care facility, levels 8  (eight) through 10 (ten) constitute medical emergencies and as such, they belong in a hospital's emergency department. When having these levels (as described below), do not come to our office. Our facility is not equipped to manage these levels. Go directly to an urgent care facility or an emergency department to be evaluated.  Definitions:  Activities of Daily Living (ADL): Activities of daily living (ADL or ADLs) is a term used in healthcare to refer to people's daily self-care activities. Health professionals often use a person's ability or inability to perform ADLs as a measurement of their functional status, particularly in regard to people post injury, with disabilities and the elderly. There are two ADL levels: Basic and Instrumental. Basic Activities of Daily Living (BADL  or BADLs) consist of self-care tasks that include: Bathing and showering; personal hygiene and grooming (including brushing/combing/styling hair); dressing; Toilet hygiene (getting to the toilet, cleaning oneself, and getting back up); eating and self-feeding (not including cooking or chewing and swallowing); functional mobility, often referred to as "transferring", as measured by the ability to walk, get in and out of bed, and get into and out of a chair; the broader definition (moving from one place to another while performing activities) is useful for people with different physical abilities who are still able to get around independently. Basic ADLs include the things many people do when they get up in the morning and get ready to go out of the house: get out of bed, go to the toilet, bathe, dress, groom, and eat. On the average, loss of function typically follows a particular order.   Hygiene is the first to go, followed by loss of toilet use and locomotion. The last to go is the ability to eat. When there is only one remaining area in which the person is independent, there is a 62.9% chance that it is eating and only a 3.5% chance  that it is hygiene. Instrumental Activities of Daily Living (IADL or IADLs) are not necessary for fundamental functioning, but they let an individual live independently in a community. IADL consist of tasks that include: cleaning and maintaining the house; home establishment and maintenance; care of others (including selecting and supervising caregivers); care of pets; child rearing; managing money; managing financials (investments, etc.); meal preparation and cleanup; shopping for groceries and necessities; moving within the community; safety procedures and emergency responses; health management and maintenance (taking prescribed medications); and using the telephone or other form of communication.  Instructions:  Most patients tend to report their pain as a combination of two factors, their physical pain and their psychosocial pain. This last one is also known as "suffering" and it is reflection of how physical pain affects you socially and psychologically. From now on, report them separately.  From this point on, when asked to report your pain level, report only your physical pain. Use the following table for reference.  Pain Clinic Pain Levels (0-5/10)  Pain Level Score  Description  No Pain 0   Mild pain 1 Nagging, annoying, but does not interfere with basic activities of daily living (ADL). Patients are able to eat, bathe, get dressed, toileting (being able to get on and off the toilet and perform personal hygiene functions), transfer (move in and out of bed or a chair without assistance), and maintain continence (able to control bladder and bowel functions). Blood pressure and heart rate are unaffected. A normal heart rate for a healthy adult ranges from 60 to 100 bpm (beats per minute).   Mild to moderate pain 2 Noticeable and distracting. Impossible to hide from other people. More frequent flare-ups. Still possible to adapt and function close to normal. It can be very annoying and may have  occasional stronger flare-ups. With discipline, patients may get used to it and adapt.   Moderate pain 3 Interferes significantly with activities of daily living (ADL). It becomes difficult to feed, bathe, get dressed, get on and off the toilet or to perform personal hygiene functions. Difficult to get in and out of bed or a chair without assistance. Very distracting. With effort, it can be ignored when deeply involved in activities.   Moderately severe pain 4 Impossible to ignore for more than a few minutes. With effort, patients may still be able to manage work or participate in some social activities. Very difficult to concentrate. Signs of autonomic nervous system discharge are evident: dilated pupils (mydriasis); mild sweating (diaphoresis); sleep interference. Heart rate becomes elevated (>115 bpm). Diastolic blood pressure (lower number) rises above 100 mmHg. Patients find relief in laying down and not moving.   Severe pain 5 Intense and extremely unpleasant. Associated with frowning face and frequent crying. Pain overwhelms the senses.  Ability to do any activity or maintain social relationships becomes significantly limited. Conversation becomes difficult. Pacing back and forth is common, as getting into a comfortable position is nearly impossible. Pain wakes you up from deep sleep. Physical signs will be obvious: pupillary dilation; increased sweating; goosebumps; brisk reflexes; cold, clammy hands and feet; nausea, vomiting or dry heaves; loss of appetite; significant sleep disturbance with inability to fall asleep or to   remain asleep. When persistent, significant weight loss is observed due to the complete loss of appetite and sleep deprivation.  Blood pressure and heart rate becomes significantly elevated. Caution: If elevated blood pressure triggers a pounding headache associated with blurred vision, then the patient should immediately seek attention at an urgent or emergency care unit, as  these may be signs of an impending stroke.    Emergency Department Pain Levels (6-10/10)  Emergency Room Pain 6 Severely limiting. Requires emergency care and should not be seen or managed at an outpatient pain management facility. Communication becomes difficult and requires great effort. Assistance to reach the emergency department may be required. Facial flushing and profuse sweating along with potentially dangerous increases in heart rate and blood pressure will be evident.   Distressing pain 7 Self-care is very difficult. Assistance is required to transport, or use restroom. Assistance to reach the emergency department will be required. Tasks requiring coordination, such as bathing and getting dressed become very difficult.   Disabling pain 8 Self-care is no longer possible. At this level, pain is disabling. The individual is unable to do even the most "basic" activities such as walking, eating, bathing, dressing, transferring to a bed, or toileting. Fine motor skills are lost. It is difficult to think clearly.   Incapacitating pain 9 Pain becomes incapacitating. Thought processing is no longer possible. Difficult to remember your own name. Control of movement and coordination are lost.   The worst pain imaginable 10 At this level, most patients pass out from pain. When this level is reached, collapse of the autonomic nervous system occurs, leading to a sudden drop in blood pressure and heart rate. This in turn results in a temporary and dramatic drop in blood flow to the brain, leading to a loss of consciousness. Fainting is one of the body's self defense mechanisms. Passing out puts the brain in a calmed state and causes it to shut down for a while, in order to begin the healing process.    Summary: 1. Refer to this scale when providing [place] with your pain level. 2. Be accurate and careful when reporting your pain level. This will help with your care. 3. Over-reporting your pain level will  lead to loss of credibility. 4. Even a level of 1/10 means that there is pain and will be treated at our facility. 5. High, inaccurate reporting will be documented as "Symptom Exaggeration", leading to loss of credibility and suspicions of possible secondary gains such as obtaining more narcotics, or wanting to appear disabled, for fraudulent reasons. 6. Only pain levels of 5 or below will be seen at our facility. 7. Pain levels of 6 and above will be sent to the Emergency Department and the appointment cancelled. ______________________________________________________________________________________________   ____________________________________________________________________________________________  Drug Holidays (Slow)  What is a "Drug Holiday"? Drug Holiday: is the name given to the period of time during which a patient stops taking a medication(s) for the purpose of eliminating tolerance to the drug.  Benefits . Improved effectiveness of opioids. . Decreased opioid dose needed to achieve benefits. . Improved pain with lesser dose.  What is tolerance? Tolerance: is the progressive decreased in effectiveness of a drug due to its repetitive use. With repetitive use, the body gets use to the medication and as a consequence, it loses its effectiveness. This is a common problem seen with opioid pain medications. As a result, a larger dose of the drug is needed to achieve the same effect that used to be obtained  with a smaller dose.  How long should a "Drug Holiday" last? At least 14 consecutive days. (2 weeks)  What are withdrawals? Withdrawals: refers to the wide range of symptoms that occur after stopping or dramatically reducing opiate drugs after heavy and prolonged use. Withdrawal symptoms do not occur to patients that use low dose opioids, or those who take the medication sporadically. Contrary to benzodiazepine (example: Valium, Xanax, etc.) or alcohol withdrawals ("Delirium  Tremens"), opioid withdrawals are not lethal. Withdrawals are the physical manifestation of the body getting rid of the excess receptors.  Expected Symptoms Early symptoms of withdrawal may include: . Agitation . Anxiety . Muscle aches . Increased tearing . Insomnia . Runny nose . Sweating . Yawning  Late symptoms of withdrawal may include: . Abdominal cramping . Diarrhea . Dilated pupils . Goose bumps . Nausea . Vomiting  Will I experience withdrawals? Due to the slow nature of the taper, it is very unlikely that you will experience any.  What is a slow taper? Taper: refers to the gradual decrease in dose. ___________________________________________________________________________________________  ____________________________________________________________________________________________  Preparing for Procedure with Sedation  Instructions: . Oral Intake: Do not eat or drink anything for at least 8 hours prior to your procedure. . Transportation: Public transportation is not allowed. Bring an adult driver. The driver must be physically present in our waiting room before any procedure can be started. Marland Kitchen Physical Assistance: Bring an adult physically capable of assisting you, in the event you need help. This adult should keep you company at home for at least 6 hours after the procedure. . Blood Pressure Medicine: Take your blood pressure medicine with a sip of water the morning of the procedure. . Blood thinners: Notify our staff if you are taking any blood thinners. Depending on which one you take, there will be specific instructions on how and when to stop it. . Diabetics on insulin: Notify the staff so that you can be scheduled 1st case in the morning. If your diabetes requires high dose insulin, take only  of your normal insulin dose the morning of the procedure and notify the staff that you have done so. . Preventing infections: Shower with an antibacterial soap the morning  of your procedure. . Build-up your immune system: Take 1000 mg of Vitamin C with every meal (3 times a day) the day prior to your procedure. Marland Kitchen Antibiotics: Inform the staff if you have a condition or reason that requires you to take antibiotics before dental procedures. . Pregnancy: If you are pregnant, call and cancel the procedure. . Sickness: If you have a cold, fever, or any active infections, call and cancel the procedure. . Arrival: You must be in the facility at least 30 minutes prior to your scheduled procedure. . Children: Do not bring children with you. . Dress appropriately: Bring dark clothing that you would not mind if they get stained. . Valuables: Do not bring any jewelry or valuables.  Procedure appointments are reserved for interventional treatments only. Marland Kitchen No Prescription Refills. . No medication changes will be discussed during procedure appointments. . No disability issues will be discussed.  Reasons to call and reschedule or cancel your procedure: (Following these recommendations will minimize the risk of a serious complication.) . Surgeries: Avoid having procedures within 2 weeks of any surgery. (Avoid for 2 weeks before or after any surgery). . Flu Shots: Avoid having procedures within 2 weeks of a flu shots or . (Avoid for 2 weeks before or after immunizations). . Barium:  Avoid having a procedure within 7-10 days after having had a radiological study involving the use of radiological contrast. (Myelograms, Barium swallow or enema study). . Heart attacks: Avoid any elective procedures or surgeries for the initial 6 months after a "Myocardial Infarction" (Heart Attack). . Blood thinners: It is imperative that you stop these medications before procedures. Let us know if you if you take any blood thinner.  . Infection: Avoid procedures during or within two weeks of an infection (including chest colds or gastrointestinal problems). Symptoms associated with infections include:  Localized redness, fever, chills, night sweats or profuse sweating, burning sensation when voiding, cough, congestion, stuffiness, runny nose, sore throat, diarrhea, nausea, vomiting, cold or Flu symptoms, recent or current infections. It is specially important if the infection is over the area that we intend to treat. Marland Kitchen Heart and lung problems: Symptoms that may suggest an active cardiopulmonary problem include: cough, chest pain, breathing difficulties or shortness of breath, dizziness, ankle swelling, uncontrolled high or unusually low blood pressure, and/or palpitations. If you are experiencing any of these symptoms, cancel your procedure and contact your primary care physician for an evaluation.  Remember:  Regular Business hours are:  Monday to Thursday 8:00 AM to 4:00 PM  Provider's Schedule: Milinda Pointer, MD:  Procedure days: Tuesday and Thursday 7:30 AM to 4:00 PM  Gillis Santa, MD:  Procedure days: Monday and Wednesday 7:30 AM to 4:00 PM ____________________________________________________________________________________________   ____________________________________________________________________________________________  General Risks and Possible Complications  Patient Responsibilities: It is important that you read this as it is part of your informed consent. It is our duty to inform you of the risks and possible complications associated with treatments offered to you. It is your responsibility as a patient to read this and to ask questions about anything that is not clear or that you believe was not covered in this document.  Patient's Rights: You have the right to refuse treatment. You also have the right to change your mind, even after initially having agreed to have the treatment done. However, under this last option, if you wait until the last second to change your mind, you may be charged for the materials used up to that point.  Introduction: Medicine is not an Armed forces logistics/support/administrative officer. Everything in Medicine, including the lack of treatment(s), carries the potential for danger, harm, or loss (which is by definition: Risk). In Medicine, a complication is a secondary problem, condition, or disease that can aggravate an already existing one. All treatments carry the risk of possible complications. The fact that a side effects or complications occurs, does not imply that the treatment was conducted incorrectly. It must be clearly understood that these can happen even when everything is done following the highest safety standards.  No treatment: You can choose not to proceed with the proposed treatment alternative. The "PRO(s)" would include: avoiding the risk of complications associated with the therapy. The "CON(s)" would include: not getting any of the treatment benefits. These benefits fall under one of three categories: diagnostic; therapeutic; and/or palliative. Diagnostic benefits include: getting information which can ultimately lead to improvement of the disease or symptom(s). Therapeutic benefits are those associated with the successful treatment of the disease. Finally, palliative benefits are those related to the decrease of the primary symptoms, without necessarily curing the condition (example: decreasing the pain from a flare-up of a chronic condition, such as incurable terminal cancer).  General Risks and Complications: These are associated to most interventional treatments. They can occur alone, or  in combination. They fall under one of the following six (6) categories: no benefit or worsening of symptoms; bleeding; infection; nerve damage; allergic reactions; and/or death. 1. No benefits or worsening of symptoms: In Medicine there are no guarantees, only probabilities. No healthcare provider can ever guarantee that a medical treatment will work, they can only state the probability that it may. Furthermore, there is always the possibility that the condition may worsen,  either directly, or indirectly, as a consequence of the treatment. 2. Bleeding: This is more common if the patient is taking a blood thinner, either prescription or over the counter (example: Goody Powders, Fish oil, Aspirin, Garlic, etc.), or if suffering a condition associated with impaired coagulation (example: Hemophilia, cirrhosis of the liver, low platelet counts, etc.). However, even if you do not have one on these, it can still happen. If you have any of these conditions, or take one of these drugs, make sure to notify your treating physician. 3. Infection: This is more common in patients with a compromised immune system, either due to disease (example: diabetes, cancer, human immunodeficiency virus [HIV], etc.), or due to medications or treatments (example: therapies used to treat cancer and rheumatological diseases). However, even if you do not have one on these, it can still happen. If you have any of these conditions, or take one of these drugs, make sure to notify your treating physician. 4. Nerve Damage: This is more common when the treatment is an invasive one, but it can also happen with the use of medications, such as those used in the treatment of cancer. The damage can occur to small secondary nerves, or to large primary ones, such as those in the spinal cord and brain. This damage may be temporary or permanent and it may lead to impairments that can range from temporary numbness to permanent paralysis and/or brain death. 5. Allergic Reactions: Any time a substance or material comes in contact with our body, there is the possibility of an allergic reaction. These can range from a mild skin rash (contact dermatitis) to a severe systemic reaction (anaphylactic reaction), which can result in death. 6. Death: In general, any medical intervention can result in death, most of the time due to an unforeseen  complication. ____________________________________________________________________________________________  Pain Management Discharge Instructions  General Discharge Instructions :  If you need to reach your doctor call: Monday-Friday 8:00 am - 4:00 pm at [phone] or toll free [phone].  After clinic hours [phone] to have operator reach doctor.  Bring all of your medication bottles to all your appointments in the pain clinic.  To cancel or reschedule your appointment with Pain Management please remember to call 24 hours in advance to avoid a fee.  Refer to the educational materials which you have been given on: General Risks, I had my Procedure. Discharge Instructions, Post Sedation.  Post Procedure Instructions:  The drugs you were given will stay in your system until tomorrow, so for the next 24 hours you should not drive, make any legal decisions or drink any alcoholic beverages.  You may eat anything you prefer, but it is better to start with liquids then soups and crackers, and gradually work up to solid foods.  Please notify your doctor immediately if you have any unusual bleeding, trouble breathing or pain that is not related to your normal pain.  Depending on the type of procedure that was done, some parts of your body may feel week and/or numb.  This usually clears up by tonight or  the next day.  Walk with the use of an assistive device or accompanied by an adult for the 24 hours.  You may use ice on the affected area for the first 24 hours.  Put ice in a Ziploc bag and cover with a towel and place against area 15 minutes on 15 minutes off.  You may switch to heat after 24 hours.GENERAL RISKS AND COMPLICATIONS  What are the risk, side effects and possible complications? Generally speaking, most procedures are safe.  However, with any procedure there are risks, side effects, and the possibility of complications.  The risks and complications are dependent upon the  sites that are lesioned, or the type of nerve block to be performed.  The closer the procedure is to the spine, the more serious the risks are.  Great care is taken when placing the radio frequency needles, block needles or lesioning probes, but sometimes complications can occur. 1. Infection: Any time there is an injection through the skin, there is a risk of infection.  This is why sterile conditions are used for these blocks.  There are four possible types of infection. 1. Localized skin infection. 2. Central Nervous System Infection-This can be in the form of Meningitis, which can be deadly. 3. Epidural Infections-This can be in the form of an epidural abscess, which can cause pressure inside of the spine, causing compression of the spinal cord with subsequent paralysis. This would require an emergency surgery to decompress, and there are no guarantees that the patient would recover from the paralysis. 4. Discitis-This is an infection of the intervertebral discs.  It occurs in about 1% of discography procedures.  It is difficult to treat and it may lead to surgery.        2. Pain: the needles have to go through skin and soft tissues, will cause soreness.       3. Damage to internal structures:  The nerves to be lesioned may be near blood vessels or    other nerves which can be potentially damaged.       4. Bleeding: Bleeding is more common if the patient is taking blood thinners such as  aspirin, Coumadin, Ticiid, Plavix, etc., or if he/she have some genetic predisposition  such as hemophilia. Bleeding into the spinal canal can cause compression of the spinal  cord with subsequent paralysis.  This would require an emergency surgery to  decompress and there are no guarantees that the patient would recover from the  paralysis.       5. Pneumothorax:  Puncturing of a lung is a possibility, every time a needle is introduced in  the area of the chest or upper back.  Pneumothorax refers to free air around  the  collapsed lung(s), inside of the thoracic cavity (chest cavity).  Another two possible  complications related to a similar event would include: Hemothorax and Chylothorax.   These are variations of the Pneumothorax, where instead of air around the collapsed  lung(s), you may have blood or chyle, respectively.       6. Spinal headaches: They may occur with any procedures in the area of the spine.       7. Persistent CSF (Cerebro-Spinal Fluid) leakage: This is a rare problem, but may occur  with prolonged intrathecal or epidural catheters either due to the formation of a fistulous  track or a dural tear.       8. Nerve damage: By working so close to the spinal cord, there is  always a possibility of  nerve damage, which could be as serious as a permanent spinal cord injury with  paralysis.       9. Death:  Although rare, severe deadly allergic reactions known as "Anaphylactic  reaction" can occur to any of the medications used.      10. Worsening of the symptoms:  We can always make thing worse.  What are the chances of something like this happening? Chances of any of this occuring are extremely low.  By statistics, you have more of a chance of getting killed in a motor vehicle accident: while driving to the hospital than any of the above occurring .  Nevertheless, you should be aware that they are possibilities.  In general, it is similar to taking a shower.  Everybody knows that you can slip, hit your head and get killed.  Does that mean that you should not shower again?  Nevertheless always keep in mind that statistics do not mean anything if you happen to be on the wrong side of them.  Even if a procedure has a 1 (one) in a 1,000,000 (million) chance of going wrong, it you happen to be that one..Also, keep in mind that by statistics, you have more of a chance of having something go wrong when taking medications.  Who should not have this procedure? If you are on a blood thinning medication (e.g.  Coumadin, Plavix, see list of "Blood Thinners"), or if you have an active infection going on, you should not have the procedure.  If you are taking any blood thinners, please inform your physician.  How should I prepare for this procedure?  Do not eat or drink anything at least six hours prior to the procedure.  Bring a driver with you .  It cannot be a taxi.  Come accompanied by an adult that can drive you back, and that is strong enough to help you if your legs get weak or numb from the local anesthetic.  Take all of your medicines the morning of the procedure with just enough water to swallow them.  If you have diabetes, make sure that you are scheduled to have your procedure done first thing in the morning, whenever possible.  If you have diabetes, take only half of your insulin dose and notify our nurse that you have done so as soon as you arrive at the clinic.  If you are diabetic, but only take blood sugar pills (oral hypoglycemic), then do not take them on the morning of your procedure.  You may take them after you have had the procedure.  Do not take aspirin or any aspirin-containing medications, at least eleven (11) days prior to the procedure.  They may prolong bleeding.  Wear loose fitting clothing that may be easy to take off and that you would not mind if it got stained with Betadine or blood.  Do not wear any jewelry or perfume  Remove any nail coloring.  It will interfere with some of our monitoring equipment.  NOTE: Remember that this is not meant to be interpreted as a complete list of all possible complications.  Unforeseen problems may occur.  BLOOD THINNERS The following drugs contain aspirin or other products, which can cause increased bleeding during surgery and should not be taken for 2 weeks prior to and 1 week after surgery.  If you should need take something for relief of minor pain, you may take acetaminophen which is found in Tylenol,m Datril, Anacin-3 and  Panadol. It is not blood thinner. The products listed below are.  Do not take any of the products listed below in addition to any listed on your instruction sheet.  A.P.C or A.P.C with Codeine Codeine Phosphate Capsules #3 Ibuprofen Ridaura  ABC compound Congesprin Imuran rimadil  Advil Cope Indocin Robaxisal  Alka-Seltzer Effervescent Pain Reliever and Antacid Coricidin or Coricidin-D  Indomethacin Rufen  Alka-Seltzer plus Cold Medicine Cosprin Ketoprofen S-A-C Tablets  Anacin Analgesic Tablets or Capsules Coumadin Korlgesic Salflex  Anacin Extra Strength Analgesic tablets or capsules CP-2 Tablets Lanoril Salicylate  Anaprox Cuprimine Capsules Levenox Salocol  Anexsia-D Dalteparin Magan Salsalate  Anodynos Darvon compound Magnesium Salicylate Sine-off  Ansaid Dasin Capsules Magsal Sodium Salicylate  Anturane Depen Capsules Marnal Soma  APF Arthritis pain formula Dewitt's Pills Measurin Stanback  Argesic Dia-Gesic Meclofenamic Sulfinpyrazone  Arthritis Bayer Timed Release Aspirin Diclofenac Meclomen Sulindac  Arthritis pain formula Anacin Dicumarol Medipren Supac  Analgesic (Safety coated) Arthralgen Diffunasal Mefanamic Suprofen  Arthritis Strength Bufferin Dihydrocodeine Mepro Compound Suprol  Arthropan liquid Dopirydamole Methcarbomol with Aspirin Synalgos  ASA tablets/Enseals Disalcid Micrainin Tagament  Ascriptin Doan's Midol Talwin  Ascriptin A/D Dolene Mobidin Tanderil  Ascriptin Extra Strength Dolobid Moblgesic Ticlid  Ascriptin with Codeine Doloprin or Doloprin with Codeine Momentum Tolectin  Asperbuf Duoprin Mono-gesic Trendar  Aspergum Duradyne Motrin or Motrin IB Triminicin  Aspirin plain, buffered or enteric coated Durasal Myochrisine Trigesic  Aspirin Suppositories Easprin Nalfon Trillsate  Aspirin with Codeine Ecotrin Regular or Extra Strength Naprosyn Uracel  Atromid-S Efficin Naproxen Ursinus  Auranofin Capsules Elmiron Neocylate Vanquish  Axotal Emagrin Norgesic  Verin  Azathioprine Empirin or Empirin with Codeine Normiflo Vitamin E  Azolid Emprazil Nuprin Voltaren  Bayer Aspirin plain, buffered or children's or timed BC Tablets or powders Encaprin Orgaran Warfarin Sodium  Buff-a-Comp Enoxaparin Orudis Zorpin  Buff-a-Comp with Codeine Equegesic Os-Cal-Gesic   Buffaprin Excedrin plain, buffered or Extra Strength Oxalid   Bufferin Arthritis Strength Feldene Oxphenbutazone   Bufferin plain or Extra Strength Feldene Capsules Oxycodone with Aspirin   Bufferin with Codeine Fenoprofen Fenoprofen Pabalate or Pabalate-SF   Buffets II Flogesic Panagesic   Buffinol plain or Extra Strength Florinal or Florinal with Codeine Panwarfarin   Buf-Tabs Flurbiprofen Penicillamine   Butalbital Compound Four-way cold tablets Penicillin   Butazolidin Fragmin Pepto-Bismol   Carbenicillin Geminisyn Percodan   Carna Arthritis Reliever Geopen Persantine   Carprofen Gold's salt Persistin   Chloramphenicol Goody's Phenylbutazone   Chloromycetin Haltrain Piroxlcam   Clmetidine heparin Plaquenil   Cllnoril Hyco-pap Ponstel   Clofibrate Hydroxy chloroquine Propoxyphen         Before stopping any of these medications, be sure to consult the physician who ordered them.  Some, such as Coumadin (Warfarin) are ordered to prevent or treat serious conditions such as "deep thrombosis", "pumonary embolisms", and other heart problems.  The amount of time that you may need off of the medication may also vary with the medication and the reason for which you were taking it.  If you are taking any of these medications, please make sure you notify your pain physician before you undergo any procedures.         Epidural Steroid Injection Patient Information  Description: The epidural space surrounds the nerves as they exit the spinal cord.  In some patients, the nerves can be compressed and inflamed by a bulging disc or a tight spinal canal (spinal stenosis).  By injecting steroids into  the epidural space, we can bring irritated nerves into direct contact  with a potentially helpful medication.  These steroids act directly on the irritated nerves and can reduce swelling and inflammation which often leads to decreased pain.  Epidural steroids may be injected anywhere along the spine and from the neck to the low back depending upon the location of your pain.   After numbing the skin with local anesthetic (like Novocaine), a small needle is passed into the epidural space slowly.  You may experience a sensation of pressure while this is being done.  The entire block usually last less than 10 minutes.  Conditions which may be treated by epidural steroids:   Low back and leg pain  Neck and arm pain  Spinal stenosis  Post-laminectomy syndrome  Herpes zoster (shingles) pain  Pain from compression fractures  Preparation for the injection:  1. Do not eat any solid food or dairy products within 8 hours of your appointment.  2. You may drink clear liquids up to 3 hours before appointment.  Clear liquids include water, black coffee, juice or soda.  No milk or cream please. 3. You may take your regular medication, including pain medications, with a sip of water before your appointment  Diabetics should hold regular insulin (if taken separately) and take 1/2 normal NPH dos the morning of the procedure.  Carry some sugar containing items with you to your appointment. 4. A driver must accompany you and be prepared to drive you home after your procedure.  5. Bring all your current medications with your. 6. An IV may be inserted and sedation may be given at the discretion of the physician.   7. A blood pressure cuff, EKG and other monitors will often be applied during the procedure.  Some patients may need to have extra oxygen administered for a short period. 8. You will be asked to provide medical information, including your allergies, prior to the procedure.  We must know immediately if  you are taking blood thinners (like Coumadin/Warfarin)  Or if you are allergic to IV iodine contrast (dye). We must know if you could possible be pregnant.  Possible side-effects:  Bleeding from needle site  Infection (rare, may require surgery)  Nerve injury (rare)  Numbness & tingling (temporary)  Difficulty urinating (rare, temporary)  Spinal headache ( a headache worse with upright posture)  Light -headedness (temporary)  Pain at injection site (several days)  Decreased blood pressure (temporary)  Weakness in arm/leg (temporary)  Pressure sensation in back/neck (temporary)  Call if you experience:  Fever/chills associated with headache or increased back/neck pain.  Headache worsened by an upright position.  New onset weakness or numbness of an extremity below the injection site  Hives or difficulty breathing (go to the emergency room)  Inflammation or drainage at the infection site  Severe back/neck pain  Any new symptoms which are concerning to you  Please note:  Although the local anesthetic injected can often make your back or neck feel good for several hours after the injection, the pain will likely return.  It takes 3-7 days for steroids to work in the epidural space.  You may not notice any pain relief for at least that one week.  If effective, we will often do a series of three injections spaced 3-6 weeks apart to maximally decrease your pain.  After the initial series, we generally will wait several months before considering a repeat injection of the same type.  If you have any questions, please call [phone] [facility]

## 2018-06-17 NOTE — Progress Notes (Signed)
Safety precautions to be maintained throughout the outpatient stay will include: orient to surroundings, keep bed in low position, maintain call bell within reach at all times, provide assistance with transfer out of bed and ambulation.  

## 2018-06-18 ENCOUNTER — Telehealth: Payer: Self-pay | Admitting: *Deleted

## 2018-06-18 NOTE — Telephone Encounter (Signed)
This message was printed and hand delivered to Dr. Dossie Arbour as well as forwarded to his inbox.       Dawn Bruce

## 2018-06-19 ENCOUNTER — Ambulatory Visit: Payer: Medicare Other | Admitting: Pain Medicine

## 2018-06-19 DIAGNOSIS — M5417 Radiculopathy, lumbosacral region: Secondary | ICD-10-CM | POA: Insufficient documentation

## 2018-06-19 DIAGNOSIS — R200 Anesthesia of skin: Secondary | ICD-10-CM | POA: Insufficient documentation

## 2018-06-19 DIAGNOSIS — R202 Paresthesia of skin: Secondary | ICD-10-CM | POA: Insufficient documentation

## 2018-06-19 NOTE — Progress Notes (Signed)
This is a note to document that I have called [place] for a peer to peer review.  The patient ID number is: Dawn Bruce [id number].  Telephone number: [phone].  Appeared to be her review conducted and MRI approved.  Authorization #[id number] expiration date 07/13/2018.

## 2018-06-20 ENCOUNTER — Ambulatory Visit (HOSPITAL_COMMUNITY): Payer: Medicare Other

## 2018-07-02 ENCOUNTER — Ambulatory Visit: Payer: Medicare Other

## 2018-09-02 ENCOUNTER — Telehealth (HOSPITAL_COMMUNITY): Payer: Self-pay

## 2018-09-02 NOTE — Telephone Encounter (Signed)
Attempted to call pt regarding her PT referral in our system and discuss her current status and other options for the time being, however, phone rang 1 time and then said "the person you are trying to reach does not have a voicemail box that is set up. Please hang up and try again later." Will attempt to call back at a later time/day as appropriate.   Geraldine Solar PT, DPT

## 2018-09-02 NOTE — Telephone Encounter (Signed)
Pt's husband returned phone call and said that they were still down in [place] until at least May 1. PT informed him that our clinic was closed until further notice due to COVID-19. He asked that we call the pt back once our clinic is reopened to get her scheduled for the eval.   Geraldine Solar PT, DPT

## 2018-09-04 ENCOUNTER — Ambulatory Visit: Payer: Self-pay | Admitting: Family Medicine

## 2018-09-09 ENCOUNTER — Telehealth: Payer: Self-pay

## 2018-09-09 ENCOUNTER — Ambulatory Visit: Admission: RE | Admit: 2018-09-09 | Payer: Medicare Other | Source: Ambulatory Visit

## 2018-09-09 NOTE — Telephone Encounter (Signed)
Informed patient that we may need to wait until later in May to order MRI since the hospital will not be doing them until June.   INformed patient to call back mid may to see if Dr Dossie Arbour will reorder the [facility],

## 2018-09-09 NOTE — Telephone Encounter (Signed)
The patient had an MRI order back in [place] but she has been in [place] and couldn't get it done. Now they are not doing MRI's at the hospital until June. They told her she would need Dr. Dossie Arbour to put in a new order for the MRI before she can schedule.

## 2018-09-25 ENCOUNTER — Telehealth: Payer: Self-pay | Admitting: *Deleted

## 2018-09-25 NOTE — Telephone Encounter (Signed)
Denied. Read her last note. She is not interested in what we have to offer and I already put an order once. Have her talk to her PCP to see if they still think is indicated. Anyway, she will need to follow-up with them.

## 2018-09-26 DIAGNOSIS — G894 Chronic pain syndrome: Secondary | ICD-10-CM | POA: Diagnosis not present

## 2018-09-26 DIAGNOSIS — E559 Vitamin D deficiency, unspecified: Secondary | ICD-10-CM | POA: Diagnosis not present

## 2018-09-26 DIAGNOSIS — N183 Chronic kidney disease, stage 3 (moderate): Secondary | ICD-10-CM | POA: Diagnosis not present

## 2018-09-26 DIAGNOSIS — I129 Hypertensive chronic kidney disease with stage 1 through stage 4 chronic kidney disease, or unspecified chronic kidney disease: Secondary | ICD-10-CM | POA: Diagnosis not present

## 2018-09-27 ENCOUNTER — Ambulatory Visit: Payer: Self-pay | Admitting: Family Medicine

## 2018-09-30 ENCOUNTER — Other Ambulatory Visit: Payer: Self-pay | Admitting: Nephrology

## 2018-09-30 ENCOUNTER — Other Ambulatory Visit: Payer: Self-pay

## 2018-09-30 DIAGNOSIS — I129 Hypertensive chronic kidney disease with stage 1 through stage 4 chronic kidney disease, or unspecified chronic kidney disease: Secondary | ICD-10-CM

## 2018-09-30 DIAGNOSIS — N183 Chronic kidney disease, stage 3 unspecified: Secondary | ICD-10-CM

## 2018-09-30 NOTE — Telephone Encounter (Signed)
Patient called office because she has a new patient appt scheduled for 12-02-18. Patient states that she is going to need a refill on her Fentanyl Patch before her appt and would like for CMA to give her a call back. Patient states that she uses [facility] on [address]. KW

## 2018-09-30 NOTE — Telephone Encounter (Signed)
Patient stated that she just needed her medication filled before her July appointment. I schedule patient appointment for 10/02/2018 @ 9:00 AM. Juluis Rainier

## 2018-10-02 ENCOUNTER — Ambulatory Visit (INDEPENDENT_AMBULATORY_CARE_PROVIDER_SITE_OTHER): Payer: Medicare Other | Admitting: Family Medicine

## 2018-10-02 VITALS — BP 130/80

## 2018-10-02 DIAGNOSIS — M79632 Pain in left forearm: Secondary | ICD-10-CM | POA: Diagnosis not present

## 2018-10-02 DIAGNOSIS — M792 Neuralgia and neuritis, unspecified: Secondary | ICD-10-CM

## 2018-10-02 DIAGNOSIS — I6521 Occlusion and stenosis of right carotid artery: Secondary | ICD-10-CM

## 2018-10-02 DIAGNOSIS — N289 Disorder of kidney and ureter, unspecified: Secondary | ICD-10-CM

## 2018-10-02 DIAGNOSIS — K219 Gastro-esophageal reflux disease without esophagitis: Secondary | ICD-10-CM

## 2018-10-02 DIAGNOSIS — Z79891 Long term (current) use of opiate analgesic: Secondary | ICD-10-CM

## 2018-10-02 DIAGNOSIS — E782 Mixed hyperlipidemia: Secondary | ICD-10-CM

## 2018-10-02 DIAGNOSIS — I10 Essential (primary) hypertension: Secondary | ICD-10-CM | POA: Diagnosis not present

## 2018-10-02 DIAGNOSIS — G894 Chronic pain syndrome: Secondary | ICD-10-CM

## 2018-10-02 DIAGNOSIS — F411 Generalized anxiety disorder: Secondary | ICD-10-CM

## 2018-10-02 MED ORDER — FENTANYL 50 MCG/HR TD PT72: 1.0000 | MEDICATED_PATCH | TRANSDERMAL | 0 refills | Status: DC

## 2018-10-02 NOTE — Progress Notes (Signed)
Patient: Dawn Bruce, Female    DOB: 04-03-1946, 73 y.o.   MRN: [id number] Visit Date: 10/03/2018  Today's Provider: Lavon Paganini, MD   Chief Complaint  Patient presents with  . Establish Care   Subjective:    Virtual Visit via Video Note  I connected with Dawn Bruce on 10/03/18 at  9:00 AM EDT by a video enabled telemedicine application and verified that I am speaking with the correct person using two identifiers.   Patient location: home Provider location: [place] involved in the visit: patient, provider     I discussed the limitations of evaluation and management by telemedicine and the availability of in person appointments. The patient expressed understanding and agreed to proceed.   New Patient:  Dawn Bruce is a 73 y.o. female who presents today to Establish Care. Patient was previously seeing Dr.Slater Reino [facility] in [place]  She feels well. She reports exercising daily walking. She reports she is sleeping well.  Daughter lives here, so she moved to be closer to her.  Patient has chronic pain in L forearm from an accident many years ago.  She was flung from a horse at [facility] and elbow was dislocated severing some nerves.  She was in a trauma center for several weeks and she was told that she may lose her arm.  She recovered and went 3 years of physical therapy.  She continues to have chronic pain.  She takes Lyrica 150 mg twice daily and uses a fentanyl patch 50 mcg that she changes every 3 days.  She has been on the same dose for several years without dose escalation.  She would like to avoid going to a pain clinic if possible.  HTN, HLD: - Medications: Diltiazem 120 mg twice daily, aspirin 81 mg daily, Lipitor 40 mg daily - Compliance: Good - Checking BP at home: No - Denies any SOB, CP, vision changes, LE edema, medication SEs, or symptoms of hypotension - Diet: Low-sodium  H/o R carotid artery angioplasty  (90% occlusion).  She does not think she has had a repeat carotid ultrasound  She takes omeprazole for GERD.  She states she is well controlled on this dose.  Patient is taking Lexapro 10 mg daily for anxiety.  She states is well controlled.  Not currently undergoing any therapy.  Denies any depression.  Patient states that she has had low kidney function for several years.  She has seen a nephrologist in the past and is scheduled for a renal ultrasound next week.  She believes that she is seen at [facility] in [place].  -----------------------------------------------------------------   Review of Systems  Constitutional: Negative.   HENT: Negative.   Eyes: Negative.   Respiratory: Negative.   Cardiovascular: Negative.   Gastrointestinal: Negative.   Endocrine: Negative.   Genitourinary: Negative.   Musculoskeletal: Negative.   Skin: Negative.   Allergic/Immunologic: Negative.   Neurological: Negative.   Hematological: Negative.   Psychiatric/Behavioral: Negative.     Social History      She  reports that she has never smoked. She has never used smokeless tobacco. She reports current alcohol use of about 21.0 standard drinks of alcohol per week. She reports that she does not use drugs.       Social History   Socioeconomic History  . Marital status: Married    Spouse name: Not on file  . Number of children: 3  . Years of education: Not on file  .  Highest education level: Not on file  Occupational History  . Occupation: retired  Scientific laboratory technician  . Financial resource strain: Not on file  . Food insecurity:    Worry: Not on file    Inability: Not on file  . Transportation needs:    Medical: Not on file    Non-medical: Not on file  Tobacco Use  . Smoking status: Never Smoker  . Smokeless tobacco: Never Used  Substance and Sexual Activity  . Alcohol use: Yes    Alcohol/week: 21.0 standard drinks    Types: 21 Standard drinks or equivalent per week  . Drug use:  Never  . Sexual activity: Not on file  Lifestyle  . Physical activity:    Days per week: Not on file    Minutes per session: Not on file  . Stress: Not on file  Relationships  . Social connections:    Talks on phone: Not on file    Gets together: Not on file    Attends religious service: Not on file    Active member of club or organization: Not on file    Attends meetings of clubs or organizations: Not on file    Relationship status: Not on file  Other Topics Concern  . Not on file  Social History Narrative  . Not on file    Past Medical History:  Diagnosis Date  . Chronic pain   . Hypertension      Patient Active Problem List   Diagnosis Date Noted  . Stenosis of right carotid artery 10/03/2018  . Essential hypertension 10/02/2018  . Renal insufficiency 06/17/2018  . Chronic forearm pain (Primary Area of Pain) (Left) 06/17/2018  . Neurogenic pain 06/17/2018  . Chronic pain syndrome 06/06/2018  . Long term current use of opiate analgesic 06/06/2018    Past Surgical History:  Procedure Laterality Date  . ABDOMINAL HYSTERECTOMY    . CAROTID ARTERY ANGIOPLASTY Right 1995(approximate)  . COSMETIC SURGERY    . ELBOW SURGERY    . TONSILLECTOMY      Family History        Family Status  Relation Name Status  . Mother  Deceased  . Father  Deceased  . Sister  Alive  . MGM  Deceased  . MGF  Deceased  . PGM  Deceased  . PGF  Deceased  . Son  Alive  . Daughter  Alive  . Daughter  Alive        Her family history includes Cancer in her father and mother.      No Known Allergies   Current Outpatient Medications:  .  aspirin 81 MG chewable tablet, Chew by mouth daily., Disp: , Rfl:  .  atorvastatin (LIPITOR) 40 MG tablet, Take 40 mg by mouth daily., Disp: , Rfl:  .  diltiazem (CARDIZEM SR) 120 MG 12 hr capsule, Take 120 mg by mouth 2 (two) times daily., Disp: , Rfl:  .  escitalopram (LEXAPRO) 10 MG tablet, Take 10 mg by mouth daily., Disp: , Rfl:  .  fentaNYL  (DURAGESIC) 50 MCG/HR, Place 1 patch onto the skin every 3 (three) days., Disp: 10 patch, Rfl: 0 .  omeprazole (PRILOSEC) 40 MG capsule, Take 40 mg by mouth daily., Disp: , Rfl:  .  pregabalin (LYRICA) 150 MG capsule, Take 150 mg by mouth 2 (two) times daily., Disp: , Rfl:  .  fentaNYL (DURAGESIC) 50 MCG/HR, Place 1 patch onto the skin every 3 (three) days., Disp: 10 patch, Rfl: 0 .  fentaNYL (DURAGESIC) 50 MCG/HR, Place 1 patch onto the skin every 3 (three) days., Disp: 10 patch, Rfl: 0   Patient Care Team: Virginia Crews, MD as PCP - General (Family Medicine)    Objective:    Vitals: BP 130/80    Vitals:   10/02/18 0916  BP: 130/80     Physical Exam Constitutional:      Appearance: Normal appearance.  Pulmonary:     Effort: Pulmonary effort is normal. No respiratory distress.  Neurological:     Mental Status: She is alert and oriented to person, place, and time.  Psychiatric:        Mood and Affect: Mood normal.        Behavior: Behavior normal.      Depression Screen PHQ 2/9 Scores 10/02/2018 06/17/2018  PHQ - 2 Score 0 0       Assessment & Plan:   I discussed the assessment and treatment plan with the patient. The patient was provided an opportunity to ask questions and all were answered. The patient agreed with the plan and demonstrated an understanding of the instructions.   The patient was advised to call back or seek an in-person evaluation if the symptoms worsen or if the condition fails to improve as anticipated.     Establish care  Exercise Activities and Dietary recommendations Goals   None      There is no immunization history on file for this patient.  Health Maintenance  Topic Date Due  . Hepatitis C Screening  [birth year]  . TETANUS/TDAP  03/08/1963  . MAMMOGRAM  03/07/1994  . COLONOSCOPY  03/07/1994  . DEXA SCAN  03/07/2009  . PNA vac Low Risk Adult (1 of 2 - PCV13) 03/07/2009  . INFLUENZA VACCINE  12/21/2018     Discussed  health benefits of physical activity, and encouraged her to engage in regular exercise appropriate for her age and condition.    We will have patient complete ROI for [facility] and previous PCP --------------------------------------------------------------------  Problem List Items Addressed This Visit      Cardiovascular and Mediastinum   Essential hypertension    Well-controlled Continue diltiazem 120 mg twice daily Get records from last PCP including last labs Recheck metabolic panel at next visit      Stenosis of right carotid artery    History of 90% stenosis requiring angioplasty Needs repeat carotid ultrasounds ordered at next visit Discussed importance of cholesterol and blood pressure control and other risk factors Asymptomatic at this time        Digestive   GERD (gastroesophageal reflux disease)    Chronic and well-controlled Continue PPI Discussed dietary changes        Genitourinary   Renal insufficiency    From description, sounds that patient has CKD, but unsure of stage Will request records from previous PCP and nephrology Has renal ultrasound upcoming next week Discussed importance of blood pressure control Avoid nephrotoxic medications        Other   Chronic pain syndrome (Chronic)    Chronic and well controlled on current regimen Reviewed controlled substance database We will continue fentanyl patch 50 mcg and Lyrica 150 mg twice daily Discussed with patient the pain contract and will have her complete this and return it to [place]      Relevant Medications   fentaNYL (DURAGESIC) 50 MCG/HR   fentaNYL (DURAGESIC) 50 MCG/HR   fentaNYL (DURAGESIC) 50 MCG/HR   Long term current use of opiate analgesic (Chronic)  Chronic and well-controlled As above we will continue fentanyl and Lyrica Pain contract as above      Chronic forearm pain (Primary Area of Pain) (Left) (Chronic)    Chronic pain of left forearm related to previous injury and nerve  injury Continue fentanyl and Lyrica Pain contract as above      Neurogenic pain - Primary (Chronic)    As above Previous nerve injury Continue Lyrica and fentanyl      Hyperlipidemia    Continue Lipitor 40 mg daily and baby aspirin Discussed importance of good control given history of carotid stenosis Review last labs not available from previous PCP Goal LDL less than 70      GAD (generalized anxiety disorder)    Chronic and well-controlled Continue Lexapro 10 mg daily Repeat GAD-7 and PHQ-9 at next visit Discussed importance of therapy Contracted for safety          Return in about 3 months (around 01/02/2019) for chronic disease f/u.   The entirety of the information documented in the History of Present Illness, Review of Systems and Physical Exam were personally obtained by me. Portions of this information were initially documented by Tiburcio Pea, CMA and reviewed by me for thoroughness and accuracy.    Virginia Crews, MD, MPH [facility] 10/03/2018 3:47 PM

## 2018-10-03 DIAGNOSIS — K219 Gastro-esophageal reflux disease without esophagitis: Secondary | ICD-10-CM | POA: Insufficient documentation

## 2018-10-03 DIAGNOSIS — E785 Hyperlipidemia, unspecified: Secondary | ICD-10-CM | POA: Insufficient documentation

## 2018-10-03 DIAGNOSIS — F411 Generalized anxiety disorder: Secondary | ICD-10-CM | POA: Insufficient documentation

## 2018-10-03 DIAGNOSIS — I6521 Occlusion and stenosis of right carotid artery: Secondary | ICD-10-CM | POA: Insufficient documentation

## 2018-10-03 NOTE — Assessment & Plan Note (Signed)
Chronic and well controlled on current regimen Reviewed controlled substance database We will continue fentanyl patch 50 mcg and Lyrica 150 mg twice daily Discussed with patient the pain contract and will have her complete this and return it to [place]

## 2018-10-03 NOTE — Assessment & Plan Note (Signed)
Chronic pain of left forearm related to previous injury and nerve injury Continue fentanyl and Lyrica Pain contract as above

## 2018-10-03 NOTE — Assessment & Plan Note (Signed)
Continue Lipitor 40 mg daily and baby aspirin Discussed importance of good control given history of carotid stenosis Review last labs not available from previous PCP Goal LDL less than 70

## 2018-10-03 NOTE — Assessment & Plan Note (Signed)
Chronic and well-controlled Continue Lexapro 10 mg daily Repeat GAD-7 and PHQ-9 at next visit Discussed importance of therapy Contracted for safety

## 2018-10-03 NOTE — Assessment & Plan Note (Signed)
From description, sounds that patient has CKD, but unsure of stage Will request records from previous PCP and nephrology Has renal ultrasound upcoming next week Discussed importance of blood pressure control Avoid nephrotoxic medications

## 2018-10-03 NOTE — Assessment & Plan Note (Signed)
History of 90% stenosis requiring angioplasty Needs repeat carotid ultrasounds ordered at next visit Discussed importance of cholesterol and blood pressure control and other risk factors Asymptomatic at this time

## 2018-10-03 NOTE — Assessment & Plan Note (Signed)
As above Previous nerve injury Continue Lyrica and fentanyl

## 2018-10-03 NOTE — Assessment & Plan Note (Signed)
Chronic and well-controlled Continue PPI Discussed dietary changes

## 2018-10-03 NOTE — Assessment & Plan Note (Signed)
Well-controlled Continue diltiazem 120 mg twice daily Get records from last PCP including last labs Recheck metabolic panel at next visit

## 2018-10-03 NOTE — Assessment & Plan Note (Signed)
Chronic and well-controlled As above we will continue fentanyl and Lyrica Pain contract as above

## 2018-10-07 ENCOUNTER — Ambulatory Visit
Admission: RE | Admit: 2018-10-07 | Discharge: 2018-10-07 | Disposition: A | Discharge status: Home / Self Care | Payer: Medicare Other | Source: Ambulatory Visit | Attending: Nephrology | Admitting: Nephrology

## 2018-10-07 DIAGNOSIS — N183 Chronic kidney disease, stage 3 unspecified: Secondary | ICD-10-CM

## 2018-10-07 DIAGNOSIS — I129 Hypertensive chronic kidney disease with stage 1 through stage 4 chronic kidney disease, or unspecified chronic kidney disease: Secondary | ICD-10-CM

## 2018-10-09 ENCOUNTER — Ambulatory Visit (HOSPITAL_COMMUNITY): Payer: Medicare Other | Attending: Nurse Practitioner

## 2018-10-09 ENCOUNTER — Other Ambulatory Visit: Payer: Self-pay

## 2018-10-09 ENCOUNTER — Encounter (HOSPITAL_COMMUNITY): Payer: Self-pay

## 2018-10-09 ENCOUNTER — Telehealth (HOSPITAL_COMMUNITY): Payer: Self-pay

## 2018-10-09 DIAGNOSIS — M25551 Pain in right hip: Secondary | ICD-10-CM | POA: Diagnosis not present

## 2018-10-09 DIAGNOSIS — G8929 Other chronic pain: Secondary | ICD-10-CM | POA: Insufficient documentation

## 2018-10-09 DIAGNOSIS — M545 Low back pain, unspecified: Secondary | ICD-10-CM

## 2018-10-09 NOTE — Telephone Encounter (Signed)
Patient understand [place] will call her to schedule Treatments once PT eval notes are faxed to their office

## 2018-10-09 NOTE — Patient Instructions (Signed)
Prone Press Up on Elbows reps: 10 hold: 10 seconds daily: 1 weekly: 7   Exercise image step 1   Exercise image step 2  Setup  Begin lying on your stomach, resting on your elbows low to the ground. Movement  Push up on your elbows, bending your back upward. Tip  Make sure to keep your hips in contact with the floor and maintain a gentle chin tuck throughout the exercise. Single Knee to Chest Stretch reps: 5 sets: 1-2 hold: 5-10 seconds daily: 1  weekly: 7      Exercise image step 1   Exercise image step 2  5 reps both legs   Setup  Begin lying on your back with your legs straight. Movement  Slowly lift one leg and hug your knee toward your chest until you feel a gentle stretch in your low back and hold. Tip  Make sure to keep you back relaxed and your opposite leg flat on the surface.

## 2018-10-09 NOTE — Therapy (Signed)
[place], [place], [address] Phone: [phone]   Fax:  [phone]  Physical Therapy Evaluation  Patient Details  Name: Dawn Bruce MRN: [id number] Date of Birth: 08-11-1945 Referring Provider (PT): Vevelyn Francois, NP   Encounter Date: 10/09/2018  PT End of Session - 10/09/18 1746    Visit Number  1    Number of Visits  8    Date for PT Re-Evaluation  11/06/18    Authorization Type  [employer] (no auth required, no visit limit; telehealth covered)    Authorization Time Period  10/09/18-11/06/18    Authorization - Visit Number  1    Authorization - Number of Visits  10    PT Start Time  1525    PT Stop Time  1606    PT Time Calculation (min)  41 min    Activity Tolerance  Patient tolerated treatment well    Behavior During Therapy  [facility] for tasks assessed/performed       Past Medical History:  Diagnosis Date  . Chronic pain   . Hypertension     Past Surgical History:  Procedure Laterality Date  . ABDOMINAL HYSTERECTOMY    . CAROTID ARTERY ANGIOPLASTY Right 1995(approximate)  . COSMETIC SURGERY    . ELBOW SURGERY    . TONSILLECTOMY      There were no vitals filed for this visit.   Subjective Assessment - 10/09/18 1525   Subjective Dawn Bruce reports she has a chronic history of back pain that it typically well managed. She reports previously she has had sciatic nerve pain down her Lt leg but is not having difficulty with this currently. She reports her back has been feeling sorer and stiffer over the last couple of months and after seeing her pain management doctor. She states her Rt hip is also beginning to ache now and states it bothers her mostly when she walks. Dawn Bruce reports she typically performs exercises every day but about 2 months ago her DVD player stopped working and she has not been able to workout to her exercise videos. She also has started walking a little bit more as the weather  has improved.  Limitations Sitting  How long can you sit comfortably? 30 minutes  How long can you walk comfortably? not limited  Patient Stated Goals I want to know some exercises that I can do to help it  Pain Assessment  Currently in Pain? Yes (0-1)  Pain Score 1  Pain Location Back  Pain Orientation Lower;Posterior  Pain Descriptors / Indicators Sore  Pain Type Chronic pain  Pain Onset More than a month ago  Pain Frequency Intermittent  Aggravating Factors  sitting for too long  Pain Relieving Factors laying down typically helps, medicine (lyrica and phenanol patch), exercises used to help  Effect of Pain on Daily Activities minimal      [facility] PT Assessment - 10/09/18 0001   Medical Diagnosis Low Back Pain  Referring Provider (PT) Vevelyn Francois, NP  Onset Date/Surgical Date  (approx 2 months ago)  Hand Dominance Right  Next MD Visit unsure  Prior Therapy for Lt UE - pt had therapy for   Precautions  Precautions None  Restrictions  Weight Bearing Restrictions No  Balance Screen  Has the patient fallen in the past 6 months No  Has the patient had a decrease in activity level because of a fear of falling?  Yes  Is the patient reluctant  to leave their home because of a fear of falling?  No  Home Environment  Living Environment Private residence  Living Arrangements Spouse/significant other  Available Help at Discharge  (daighter lives in [place])  Type of [place] Access Level entry  [place] None  Prior Function  Level of Independence Independent  Vocation Retired  Leisure Pt enjoys walking her dog, reading, watching movies, working in the yard, and playing cards. She used to enjoy playing golf, but cannot play anymore due to Lt hand injury and weakness.  Cognition  Overall Cognitive Status Within Functional Limits for tasks assessed  Observation/Other Assessments  Focus on Therapeutic Outcomes (FOTO)   (wroing body part  inputed)  ROM / Strength  AROM / PROM / Strength AROM;Strength  AROM  Overall AROM Comments All motions WFL's. Pt has some pain/discomfort with front Rt/Lt quadrant and and back Lt quadrant  AROM Assessment Site Lumbar  Lumbar Flexion 65  Lumbar Extension 26  Lumbar - Right Side Bend 25  Lumbar - Left Side Bend 25  Strength  Strength Assessment Site Hip;Knee;Ankle  Right/Left Knee Right;Left  Right Hip Flexion 4+/5  Right Hip Extension 4+/5  Right Hip ABduction 4+/5  Left Hip Flexion 4+/5  Left Hip Extension 4+/5  Left Hip ABduction 4+/5  Right Knee Flexion 4+/5  Right Knee Extension 5/5  Left Knee Flexion 4+/5  Left Knee Extension 5/5  Right Ankle Dorsiflexion 4+/5  Left Ankle Dorsiflexion 4+/5  Palpation  Spinal mobility normal mobility of lumbar spine, no pain/symptom provocation with central PA's  Palpation comment tenderness along superior gluteus maximus bil, and along quadratus lumborum bilaterally     Objective measurements completed on examination: See above findings.      [facility] PT Treatment/Exercise - 10/09/18 0001  Exercises Lumbar  Lumbar Exercises: Stretches  Single Knee to Chest Stretch Right;Left;5 reps;10 seconds  Prone on Elbows Stretch 10 seconds  Prone on Elbows Stretch Limitations 10 reps       PT Education - 10/09/18 1744    Education Details  Educated on plan to transfer care to [facility] to accomodate patient as she lives much closer to this office. Educated on option to transition to telehealth for therapy session as pt has all equipment necessary. Provided initial HEP and educated on repetition, duration, and frequency.    Person(s) Educated  Patient    Methods  Explanation;Handout    Comprehension  Verbalized understanding;Returned demonstration       PT Short Term Goals - 10/09/18 1749      PT SHORT TERM GOAL #1   Title  Patient will be independent with HEP, updated PRN, to return to regular daily exercies routine and PLOF/activity.     Time  2    Period  Weeks    Status  New    Target Date  10/23/18        PT Long Term Goals - 10/09/18 1749      PT LONG TERM GOAL #1   Title  Patient will have no pain with full AROM of lumbar spine to indicate improved activity/mobility tolerance.    Time  4    Period  Weeks    Status  New    Target Date  11/06/18      PT LONG TERM GOAL #2   Title  Patient will return to participating in "Cora Daniels" exercise videos at least 4x per week with no back or hip pain to  return to PLOF    Time  4    Period  Weeks        Plan - 10/09/18 1747    Clinical Impression Statement Dawn Bruce presents to physical therapy for evaluation of back pain and Rt hip pain. She reports a recent change in activity level over the last ~2 months in which she has not been able to perform her regular exercises. Objecti  testing reveals lumbar ROM is WNL's and she has mild discomfort in lumbar quadrants. Overall LE strength is 4+ or greater throughout bil and she has no obvious gait or balance impairments. Dawn Bruce is tender along bil gluteus maximus and quadratus lumborum muscles. She is presenting with signs indicating musculoskeletal involvement and will benefit from skill PT interventions to improve her activity tolerance in order to return to participating in exercise videos.    Personal Factors and Comorbidities  Age;Comorbidity 1    Comorbidities  HTN    Examination-Activity Limitations  Sit    Examination-Participation Restrictions  Community Activity    Stability/Clinical Decision Making  Stable/Uncomplicated    Clinical Decision Making  Low    Rehab Potential  Good    PT Frequency  2x / week    PT Duration  4 weeks    PT Treatment/Interventions  ADLs/Self Care Home Management;Aquatic Therapy;Cryotherapy;Electrical Stimulation;Iontophoresis 4mg /ml Dexamethasone;Moist Heat;Functional mobility training;Therapeutic activities;Therapeutic exercise;Balance training;Neuromuscular  re-education;Patient/family education;Manual techniques;Passive range of motion;Taping    PT Home Exercise Plan  Eval: SKTC, prone on elbows    Consulted and Agree with Plan of Care  Patient       Patient will benefit from skilled therapeutic intervention in order to improve the following deficits and impairments:  Pain, Decreased activity tolerance, Decreased strength, Impaired flexibility  Visit Diagnosis: Chronic midline low back pain, unspecified whether sciatica present  Pain in right hip     Problem List Patient Active Problem List   Diagnosis Date Noted  . Stenosis of right carotid artery 10/03/2018  . Hyperlipidemia 10/03/2018  . GERD (gastroesophageal reflux disease) 10/03/2018  . GAD (generalized anxiety disorder) 10/03/2018  . Essential hypertension 10/02/2018  . Renal insufficiency 06/17/2018  . Chronic forearm pain (Primary Area of Pain) (Left) 06/17/2018  . Neurogenic pain 06/17/2018  . Chronic pain syndrome 06/06/2018  . Long term current use of opiate analgesic 06/06/2018    Kipp Brood, PT, DPT, [facility] Physical Therapist with [facility]  10/09/2018 5:50 PM    [place], [place], [address] Phone: [phone]   Fax:  [phone]  Name: Dawn Bruce MRN: [id number] Date of Birth: 1945-12-18

## 2018-10-09 NOTE — Telephone Encounter (Signed)
Called [facility] b/c pt wants to transfer for treatment closure to her home. Spoke with Tammy she request notes of evaluation be faxed to [phone] once completed and they will call to schedule pt.

## 2018-10-16 ENCOUNTER — Encounter: Payer: Self-pay | Admitting: Physical Therapy

## 2018-10-16 ENCOUNTER — Other Ambulatory Visit: Payer: Self-pay

## 2018-10-16 ENCOUNTER — Ambulatory Visit: Payer: Medicare Other | Attending: Nurse Practitioner | Admitting: Physical Therapy

## 2018-10-16 DIAGNOSIS — M545 Low back pain, unspecified: Secondary | ICD-10-CM

## 2018-10-16 DIAGNOSIS — G8929 Other chronic pain: Secondary | ICD-10-CM | POA: Insufficient documentation

## 2018-10-16 NOTE — Therapy (Signed)
[place] [facility] [address], [place], [address] Phone: [phone]   Fax:  [phone]  Physical Therapy Treatment  Patient Details  Name: Dawn Bruce MRN: [id number] Date of Birth: 03/30/1946 Referring Provider (PT): Vevelyn Francois, NP   Encounter Date: 10/16/2018  PT End of Session - 10/16/18 1346    Visit Number  2    Date for PT Re-Evaluation  11/06/18    PT Start Time  0130    PT Stop Time  0215    PT Time Calculation (min)  45 min    Activity Tolerance  Patient tolerated treatment well    Behavior During Therapy  [facility] for tasks assessed/performed       Past Medical History:  Diagnosis Date  . Chronic pain   . Hypertension     Past Surgical History:  Procedure Laterality Date  . ABDOMINAL HYSTERECTOMY    . CAROTID ARTERY ANGIOPLASTY Right 1995(approximate)  . COSMETIC SURGERY    . ELBOW SURGERY    . TONSILLECTOMY      There were no vitals filed for this visit.  Subjective Assessment - 10/16/18 1337    Subjective  Patient reports only 1/10 LBP over the weekend that is a dull ache in the LB that sometimes aches into bilat LEs. Reports some occassional numbess into LLE.     Limitations  Sitting    How long can you sit comfortably?  30 minutes    How long can you walk comfortably?  not limited    Patient Stated Goals  I want to know some exercises that I can do to help it    Pain Onset  More than a month ago       Ther-Ex - Nustep 27min L5 with SPM over 50spm - Squat with chair for TC for ROM and 2 finger support 3x 10 with min cuing initally for proper form with good carry over following - Row in mini squat position 10# 3x 10 with cuing for set up with pelvic tilt with good core activation with good carry over folloiwng - Palloff anti rotation 5# 3x 8 each direction with demo and cuing for proper form initially with eccentric control with good carry over following - Bridge 3x 10 with cuing  initially for proper core contraction maintained throughout with good carry over - Prone press up x10 with good carry over from eval - Single knee to chest 5x each side 10sec hold with good carry over from eval - Seated piriformis stretch 2x 30sec hold eac side                         PT Education - 10/16/18 1345    Education Details  Therex form; HEP review    Person(s) Educated  Patient    Methods  Explanation;Demonstration;Verbal cues    Comprehension  Verbalized understanding;Returned demonstration;Verbal cues required       PT Short Term Goals - 10/09/18 1749      PT SHORT TERM GOAL #1   Title  Patient will be independent with HEP, updated PRN, to return to regular daily exercies routine and PLOF/activity.    Time  2    Period  Weeks    Status  New    Target Date  10/23/18        PT Long Term Goals - 10/09/18 1749      PT LONG TERM GOAL #1  Title  Patient will have no pain with full AROM of lumbar spine to indicate improved activity/mobility tolerance.    Time  4    Period  Weeks    Status  New    Target Date  11/06/18      PT LONG TERM GOAL #2   Title  Patient will return to participating in "Cora Daniels" exercise videos at least 4x per week with no back or hip pain to return to PLOF    Time  4    Period  Weeks            Plan - 10/16/18 1424    Clinical Impression Statement  PT progressed therex for core/hip strengthening and stability with good carry over of proper form following PT demo and cuing. Patient reports no pain throughout session, only muscle fatigue that subsides when sets are completed. Patient is able to demonstrate all HEP therex with accuracy, reporting she feels a good stretch with these. PT educated patient on sciatica anatoomy and nature, which she verbalized understanding of.     Personal Factors and Comorbidities  Age    Comorbidities  HTN    Examination-Activity Limitations  Sit    Examination-Participation  Restrictions  Community Activity    Stability/Clinical Decision Making  Stable/Uncomplicated    Rehab Potential  Good    PT Frequency  2x / week    PT Duration  4 weeks    PT Treatment/Interventions  ADLs/Self Care Home Management;Aquatic Therapy;Cryotherapy;Electrical Stimulation;Iontophoresis 4mg /ml Dexamethasone;Moist Heat;Functional mobility training;Therapeutic activities;Therapeutic exercise;Balance training;Neuromuscular re-education;Patient/family education;Manual techniques;Passive range of motion;Taping    PT Home Exercise Plan  Eval: SKTC, prone on elbows       Patient will benefit from skilled therapeutic intervention in order to improve the following deficits and impairments:  Pain, Decreased activity tolerance, Decreased strength, Impaired flexibility  Visit Diagnosis: Chronic midline low back pain, unspecified whether sciatica present     Problem List Patient Active Problem List   Diagnosis Date Noted  . Stenosis of right carotid artery 10/03/2018  . Hyperlipidemia 10/03/2018  . GERD (gastroesophageal reflux disease) 10/03/2018  . GAD (generalized anxiety disorder) 10/03/2018  . Essential hypertension 10/02/2018  . Renal insufficiency 06/17/2018  . Chronic forearm pain (Primary Area of Pain) (Left) 06/17/2018  . Neurogenic pain 06/17/2018  . Chronic pain syndrome 06/06/2018  . Long term current use of opiate analgesic 06/06/2018   Shelton Silvas PT, DPT Shelton Silvas 10/16/2018, 2:33 PM  [place] [facility] [address], [place], [address] Phone: [phone]   Fax:  [phone]  Name: Dawn Bruce MRN: [id number] Date of Birth: 1945/09/03

## 2018-10-17 DIAGNOSIS — L570 Actinic keratosis: Secondary | ICD-10-CM | POA: Diagnosis not present

## 2018-10-17 DIAGNOSIS — L820 Inflamed seborrheic keratosis: Secondary | ICD-10-CM | POA: Diagnosis not present

## 2018-10-17 DIAGNOSIS — L578 Other skin changes due to chronic exposure to nonionizing radiation: Secondary | ICD-10-CM | POA: Diagnosis not present

## 2018-10-21 ENCOUNTER — Encounter: Payer: Self-pay | Admitting: Physical Therapy

## 2018-10-21 ENCOUNTER — Ambulatory Visit: Payer: Medicare Other | Attending: Nurse Practitioner | Admitting: Physical Therapy

## 2018-10-21 ENCOUNTER — Other Ambulatory Visit: Payer: Self-pay

## 2018-10-21 ENCOUNTER — Ambulatory Visit: Payer: Medicare Other | Admitting: Physical Therapy

## 2018-10-21 DIAGNOSIS — G8929 Other chronic pain: Secondary | ICD-10-CM | POA: Diagnosis not present

## 2018-10-21 DIAGNOSIS — M545 Low back pain, unspecified: Secondary | ICD-10-CM

## 2018-10-21 DIAGNOSIS — M25551 Pain in right hip: Secondary | ICD-10-CM | POA: Insufficient documentation

## 2018-10-21 NOTE — Therapy (Signed)
[place] [facility] [address], [place], [address] Phone: [phone]   Fax:  [phone]  Physical Therapy Treatment  Patient Details  Name: Dawn Bruce MRN: [id number] Date of Birth: [birth year] Referring Provider (PT): Vevelyn Francois, NP   Encounter Date: 10/21/2018  PT End of Session - 10/21/18 1142    Visit Number  3    Number of Visits  8    Date for PT Re-Evaluation  11/06/18    Authorization Type  [employer] (no auth required, no visit limit; telehealth covered)    Authorization Time Period  10/09/18-11/06/18    Authorization - Visit Number  2    Authorization - Number of Visits  10    PT Start Time  1130    PT Stop Time  1215    PT Time Calculation (min)  45 min    Activity Tolerance  Patient tolerated treatment well    Behavior During Therapy  [facility] for tasks assessed/performed       Past Medical History:  Diagnosis Date  . Chronic pain   . Hypertension     Past Surgical History:  Procedure Laterality Date  . ABDOMINAL HYSTERECTOMY    . CAROTID ARTERY ANGIOPLASTY Right 1995(approximate)  . COSMETIC SURGERY    . ELBOW SURGERY    . TONSILLECTOMY      There were no vitals filed for this visit.  Subjective Assessment - 10/21/18 1135    Subjective  Patient reports lateral hip pain bilat with 1/10 pain. Pt reports she was very sore after last session but this subsided after 24hours. Patient reports she has been doing her exercises on tape, and they have gone well.     Limitations  Sitting    How long can you sit comfortably?  30 minutes    How long can you walk comfortably?  not limited    Patient Stated Goals  I want to know some exercises that I can do to help it    Pain Onset  More than a month ago       Ther-Ex - Nustep 25min L5 with SPM over 60spm - Squat with chair for TC for ROM and 2 finger support 3x 10 with min cuing to prevent ant tib translation - Backward lunge 3x 10 each side;  first set unilateral support, second set 2 finger support, third set 2 finger support with lunge to hip flex; cuing for control with return to stand to challenge SL balance - Theraball oblique twists 3x 10 with cuing for set up with posterior pelvic tilt and core activation with good carry over - Bridge 3x 10 with feet on bosu ball (hard side) with min cuing for proper form with good carry over following - Supine piriformis stretch x45sec hold eac side - Supine figure 4 stretch x45sec hold eac side - Supine knee to opposite shoulder stretch x45sec hold each side                       PT Education - 10/21/18 1140    Education Details  therex form    Person(s) Educated  Patient    Methods  Explanation;Demonstration;Tactile cues;Verbal cues    Comprehension  Verbalized understanding;Returned demonstration;Verbal cues required;Tactile cues required       PT Short Term Goals - 10/09/18 1749      PT SHORT TERM GOAL #1   Title  Patient will be independent with HEP,  updated PRN, to return to regular daily exercies routine and PLOF/activity.    Time  2    Period  Weeks    Status  New    Target Date  10/23/18        PT Long Term Goals - 10/09/18 1749      PT LONG TERM GOAL #1   Title  Patient will have no pain with full AROM of lumbar spine to indicate improved activity/mobility tolerance.    Time  4    Period  Weeks    Status  New    Target Date  11/06/18      PT LONG TERM GOAL #2   Title  Patient will return to participating in "Cora Daniels" exercise videos at least 4x per week with no back or hip pain to return to PLOF    Time  4    Period  Weeks            Plan - 10/21/18 1207    Clinical Impression Statement  PT continue therex for hip and core strengthening, with emphasis on warm up and cool down stretching to ensure decreaed post exercise soreness, to aid in pt compliance and success with therex. Pt is able to complete all therex with accuracy  following PT cuing, and reports no pain throughout session, only noted muscle fatigue. PT will continue progression with functional movement carry over as able.     Personal Factors and Comorbidities  Age    Comorbidities  HTN    Examination-Activity Limitations  Sit    Examination-Participation Restrictions  Community Activity    Stability/Clinical Decision Making  Stable/Uncomplicated    Clinical Decision Making  Low    Rehab Potential  Good    PT Frequency  2x / week    PT Duration  4 weeks    PT Treatment/Interventions  ADLs/Self Care Home Management;Aquatic Therapy;Cryotherapy;Electrical Stimulation;Iontophoresis 4mg /ml Dexamethasone;Moist Heat;Functional mobility training;Therapeutic activities;Therapeutic exercise;Balance training;Neuromuscular re-education;Patient/family education;Manual techniques;Passive range of motion;Taping    PT Home Exercise Plan  Eval: SKTC, prone on elbows    Consulted and Agree with Plan of Care  Patient       Patient will benefit from skilled therapeutic intervention in order to improve the following deficits and impairments:  Pain, Decreased activity tolerance, Decreased strength, Impaired flexibility  Visit Diagnosis: Chronic midline low back pain, unspecified whether sciatica present  Pain in right hip     Problem List Patient Active Problem List   Diagnosis Date Noted  . Stenosis of right carotid artery 10/03/2018  . Hyperlipidemia 10/03/2018  . GERD (gastroesophageal reflux disease) 10/03/2018  . GAD (generalized anxiety disorder) 10/03/2018  . Essential hypertension 10/02/2018  . Renal insufficiency 06/17/2018  . Chronic forearm pain (Primary Area of Pain) (Left) 06/17/2018  . Neurogenic pain 06/17/2018  . Chronic pain syndrome 06/06/2018  . Long term current use of opiate analgesic 06/06/2018   Shelton Silvas PT, DPT Shelton Silvas 10/21/2018, 1:21 PM  [place] [facility] [address] [place], [address] Phone: [phone]   Fax:  [phone]  Name: Dawn Bruce MRN: [id number] Date of Birth: 1945-10-29

## 2018-10-23 ENCOUNTER — Encounter: Payer: Medicare Other | Admitting: Physical Therapy

## 2018-10-29 ENCOUNTER — Ambulatory Visit: Payer: Medicare Other | Admitting: Physical Therapy

## 2018-10-31 ENCOUNTER — Encounter: Payer: Self-pay | Admitting: Physical Therapy

## 2018-10-31 ENCOUNTER — Ambulatory Visit: Payer: Medicare Other | Admitting: Physical Therapy

## 2018-10-31 ENCOUNTER — Other Ambulatory Visit: Payer: Self-pay

## 2018-10-31 DIAGNOSIS — M545 Low back pain, unspecified: Secondary | ICD-10-CM

## 2018-10-31 DIAGNOSIS — G8929 Other chronic pain: Secondary | ICD-10-CM

## 2018-10-31 DIAGNOSIS — M25551 Pain in right hip: Secondary | ICD-10-CM | POA: Diagnosis not present

## 2018-10-31 NOTE — Therapy (Signed)
[place] [facility] [address], [place], [address] Phone: [phone]   Fax:  [phone]  Physical Therapy Treatment  Patient Details  Name: Dawn Bruce MRN: [id number] Date of Birth: 06-07-1945 Referring Provider (PT): Vevelyn Francois, NP   Encounter Date: 10/31/2018  PT End of Session - 10/31/18 1337    Visit Number  4    Number of Visits  8    Date for PT Re-Evaluation  11/06/18    PT Start Time  1330    PT Stop Time  1415    PT Time Calculation (min)  45 min    Activity Tolerance  Patient tolerated treatment well    Behavior During Therapy  [facility] for tasks assessed/performed       Past Medical History:  Diagnosis Date  . Chronic pain   . Hypertension     Past Surgical History:  Procedure Laterality Date  . ABDOMINAL HYSTERECTOMY    . CAROTID ARTERY ANGIOPLASTY Right 1995(approximate)  . COSMETIC SURGERY    . ELBOW SURGERY    . TONSILLECTOMY      There were no vitals filed for this visit.  Subjective Assessment - 10/31/18 1334    Subjective  Patient reports she has had 0-1/10 pain over the past week. Reports she has a "strange tingle" shoot down her leg on one occassion that came on quickly and stopped quickly.    Limitations  Sitting    How long can you sit comfortably?  30 minutes    How long can you walk comfortably?  not limited    Patient Stated Goals  I want to know some exercises that I can do to help it    Pain Onset  More than a month ago         Ther-Ex - Nustep 67min L5 with SPM over 60spm - Squat with chair for TC for ROM and 2 finger support x10; 2x 10 without any UE support with good carry over of previous cuing, and only min cuing needed initially for set up with good carry over following - Side stepping with black sports cord 3 rounds of 3x 3 steps out and back b/l with supervision; 1x minA to prevent LOB and cuing for eccentric control  - SL bridge 3x 10 each LE with min  cuing initially for proper form with core activation and full hip ext with good carry over following - Alt supermans 3x 8/8/6 with good carry over of cuing for maintaining core contraction - Supine piriformis stretch x45sec hold eac side - Supine figure 4 stretch x45sec hold eac side - Supine knee to opposite shoulder stretch x45sec hold each side                       PT Education - 10/31/18 1336    Education Details  therex form    Person(s) Educated  Patient    Methods  Explanation;Demonstration;Verbal cues    Comprehension  Verbalized understanding;Returned demonstration;Verbal cues required       PT Short Term Goals - 10/09/18 1749      PT SHORT TERM GOAL #1   Title  Patient will be independent with HEP, updated PRN, to return to regular daily exercies routine and PLOF/activity.    Time  2    Period  Weeks    Status  New    Target Date  10/23/18        PT  Long Term Goals - 10/09/18 1749      PT LONG TERM GOAL #1   Title  Patient will have no pain with full AROM of lumbar spine to indicate improved activity/mobility tolerance.    Time  4    Period  Weeks    Status  New    Target Date  11/06/18      PT LONG TERM GOAL #2   Title  Patient will return to participating in "Cora Daniels" exercise videos at least 4x per week with no back or hip pain to return to PLOF    Time  4    Period  Weeks            Plan - 10/31/18 1354    Clinical Impression Statement  PT continued therex progression which patient is able to complete with accuracy following cuing from PT. Pt is demonstrating good carry over of technique between sessions, with only minimal cuing needed for correction.    Personal Factors and Comorbidities  Age    Comorbidities  HTN    Examination-Activity Limitations  Sit    Examination-Participation Restrictions  Community Activity    Stability/Clinical Decision Making  Stable/Uncomplicated    Clinical Decision Making  Low    Rehab  Potential  Good    PT Frequency  2x / week    PT Duration  4 weeks    PT Treatment/Interventions  ADLs/Self Care Home Management;Aquatic Therapy;Cryotherapy;Electrical Stimulation;Iontophoresis 4mg /ml Dexamethasone;Moist Heat;Functional mobility training;Therapeutic activities;Therapeutic exercise;Balance training;Neuromuscular re-education;Patient/family education;Manual techniques;Passive range of motion;Taping    PT Home Exercise Plan  Eval: SKTC, prone on elbows    Consulted and Agree with Plan of Care  Patient       Patient will benefit from skilled therapeutic intervention in order to improve the following deficits and impairments:  Pain, Decreased activity tolerance, Decreased strength, Impaired flexibility  Visit Diagnosis:     Problem List Patient Active Problem List   Diagnosis Date Noted  . Stenosis of right carotid artery 10/03/2018  . Hyperlipidemia 10/03/2018  . GERD (gastroesophageal reflux disease) 10/03/2018  . GAD (generalized anxiety disorder) 10/03/2018  . Essential hypertension 10/02/2018  . Renal insufficiency 06/17/2018  . Chronic forearm pain (Primary Area of Pain) (Left) 06/17/2018  . Neurogenic pain 06/17/2018  . Chronic pain syndrome 06/06/2018  . Long term current use of opiate analgesic 06/06/2018   Shelton Silvas PT, DPT Shelton Silvas 10/31/2018, 2:06 PM  [place] [facility] [address] [place], [address] Phone: [phone]   Fax:  [phone]  Name: Dawn Bruce MRN: [id number] Date of Birth: 01-17-1946

## 2018-11-05 ENCOUNTER — Ambulatory Visit: Payer: Medicare Other | Admitting: Physical Therapy

## 2018-11-07 ENCOUNTER — Other Ambulatory Visit: Payer: Self-pay

## 2018-11-07 ENCOUNTER — Encounter: Payer: Self-pay | Admitting: Physical Therapy

## 2018-11-07 ENCOUNTER — Ambulatory Visit: Payer: Medicare Other | Admitting: Physical Therapy

## 2018-11-07 DIAGNOSIS — G8929 Other chronic pain: Secondary | ICD-10-CM

## 2018-11-07 DIAGNOSIS — M545 Low back pain, unspecified: Secondary | ICD-10-CM

## 2018-11-07 DIAGNOSIS — M25551 Pain in right hip: Secondary | ICD-10-CM | POA: Diagnosis not present

## 2018-11-07 NOTE — Therapy (Signed)
[facility] [address], [place], [address] Phone: [phone]   Fax:  [phone]  Physical Therapy Treatment/ Discharge Summary  Patient Details  Name: Dawn Bruce MRN: [id number] Date of Birth: 1945/11/12 Referring Provider (PT): Vevelyn Francois, NP   Encounter Date: 11/07/2018  PT End of Session - 11/07/18 1336    Visit Number  5    Number of Visits  8    Date for PT Re-Evaluation  11/06/18    Authorization - Visit Number  3    Authorization - Number of Visits  10    PT Start Time  0130    PT Stop Time  0215    PT Time Calculation (min)  45 min    Activity Tolerance  Patient tolerated treatment well    Behavior During Therapy  [facility] for tasks assessed/performed       Past Medical History:  Diagnosis Date  . Chronic pain   . Hypertension     Past Surgical History:  Procedure Laterality Date  . ABDOMINAL HYSTERECTOMY    . CAROTID ARTERY ANGIOPLASTY Right 1995(approximate)  . COSMETIC SURGERY    . ELBOW SURGERY    . TONSILLECTOMY      There were no vitals filed for this visit.  Subjective Assessment - 11/07/18 1334    Subjective  Patient reports she is still having some pain after sitting for 30-82mn that tingles down the back of the leg. Reports her exercises are going well and make her feel better.    Limitations  Sitting    How long can you sit comfortably?  30 minutes    How long can you walk comfortably?  not limited    Patient Stated Goals  I want to know some exercises that I can do to help it    Pain Onset  More than a month ago       Ther-Ex - Nustep 557m L5 with SPM over60spm - Prone press up x10 with centralization - Squat with chair for TC with GTB 2x 10 with min cuing for set up and good carry over of proper from from previous session - Side stepping with GTB 10 steps down and back with min cuing for eccentric control and good carry over following; 10 steps down and back  in mini squat position with good carry over of proper technique - Bridge with GTB 2x 10 with min cuing for eccentric control with good carry over following - Alt supermans 2x8 with min cuing to prevent excessive hip ext that causes pain - Anti rotation in palloff press BTB 2x 10 with cuing for how to inc resistance on band -Supinepiriformis stretch x30sec hold eac side -Supine figure 4stretch x30sec hold eac side - Supine knee to opposite shoulder stretch x30sec hold each side - Verbal review of SL bridge exercise with photo   Printout of all exercises above given with education on strengthening and stretching parameters. Education on repeated extension and centralization as patient responds well to this. To complete x10 of standing, seated, or prone press ext daily as able                         PT Education - 11/07/18 1336    Education Details  therex form    Person(s) Educated  Patient    Methods  Explanation;Demonstration;Tactile cues;Verbal cues    Comprehension  Verbalized understanding;Returned demonstration;Verbal cues required  PT Short Term Goals - 11/07/18 1343      PT SHORT TERM GOAL #1   Title  Patient will be independent with HEP, updated PRN, to return to regular daily exercies routine and PLOF/activity.    Time  2    Period  Weeks    Status  Achieved        PT Long Term Goals - 11/07/18 1337      PT LONG TERM GOAL #1   Title  Patient will have no pain with full AROM of lumbar spine to indicate improved activity/mobility tolerance.    Time  4    Period  Weeks    Status  Achieved      PT LONG TERM GOAL #2   Title  Patient will return to participating in "Cora Daniels" exercise videos at least 4x per week with no back or hip pain to return to PLOF    Baseline  11/07/18 Reports she is able to complete as much as she would like without pain    Time  4    Period  Weeks    Status  Achieved            Plan - 11/07/18 1420     Clinical Impression Statement  Patient goals reassessed this session, where patient reports she has met all of her personal goals. Patient reports she still has some difficulty with tingling/numbess, but thinks this will continue to get better with HEP. Patient able to complete all of HEP with accuracy and verbalizes understanding of all provided education. Patient given PT contact info should any further questions/concerns arise. PT D/C to HEP    Personal Factors and Comorbidities  Age    Comorbidities  HTN    Examination-Activity Limitations  Sit    Examination-Participation Restrictions  Community Activity    Stability/Clinical Decision Making  Stable/Uncomplicated    Clinical Decision Making  Moderate    Rehab Potential  Good    PT Frequency  2x / week    PT Duration  4 weeks    PT Treatment/Interventions  ADLs/Self Care Home Management;Aquatic Therapy;Cryotherapy;Electrical Stimulation;Iontophoresis 1m/ml Dexamethasone;Moist Heat;Functional mobility training;Therapeutic activities;Therapeutic exercise;Balance training;Neuromuscular re-education;Patient/family education;Manual techniques;Passive range of motion;Taping    PT Home Exercise Plan  See 11/07/18    Consulted and Agree with Plan of Care  Patient       Patient will benefit from skilled therapeutic intervention in order to improve the following deficits and impairments:     Visit Diagnosis: 1. Chronic midline low back pain, unspecified whether sciatica present   2. Pain in right hip        Problem List Patient Active Problem List   Diagnosis Date Noted  . Stenosis of right carotid artery 10/03/2018  . Hyperlipidemia 10/03/2018  . GERD (gastroesophageal reflux disease) 10/03/2018  . GAD (generalized anxiety disorder) 10/03/2018  . Essential hypertension 10/02/2018  . Renal insufficiency 06/17/2018  . Chronic forearm pain (Primary Area of Pain) (Left) 06/17/2018  . Neurogenic pain 06/17/2018  . Chronic pain syndrome  06/06/2018  . Long term current use of opiate analgesic 06/06/2018   CShelton SilvasPT, DPT CShelton Silvas6/18/2020, 2:22 PM  [facility] [address] [place] [address]Phone: [phone]  Fax:  [phone] Name: MDomini VandeheiMRN: [id number] of Birth: 1Feb 16, 1947

## 2018-11-11 ENCOUNTER — Other Ambulatory Visit: Payer: Self-pay | Admitting: Family Medicine

## 2018-11-11 NOTE — Telephone Encounter (Signed)
Pt needing refills on:  pregabalin (LYRICA) 150 MG capsule atorvastatin (LIPITOR) 40 MG tablet escitalopram (LEXAPRO) 10 MG tablet diltiazem (CARDIZEM SR) 120 MG 12 hr capsule   Please fill at: [id number] [address], [place] (Phone) [phone] (Fax)   Thanks, [place]

## 2018-11-12 ENCOUNTER — Ambulatory Visit: Payer: Medicare Other | Admitting: Physical Therapy

## 2018-11-12 MED ORDER — ATORVASTATIN CALCIUM 40 MG PO TABS: 40.0000 mg | ORAL_TABLET | Freq: Every day | ORAL | 1 refills | Status: DC

## 2018-11-12 MED ORDER — ESCITALOPRAM OXALATE 10 MG PO TABS: 10.0000 mg | ORAL_TABLET | Freq: Every day | ORAL | 1 refills | Status: DC

## 2018-11-12 MED ORDER — PREGABALIN 150 MG PO CAPS: 150.0000 mg | ORAL_CAPSULE | Freq: Two times a day (BID) | ORAL | 1 refills | Status: DC

## 2018-11-12 MED ORDER — DILTIAZEM HCL ER 120 MG PO CP12: 120.0000 mg | ORAL_CAPSULE | Freq: Two times a day (BID) | ORAL | 1 refills | Status: DC

## 2018-11-14 ENCOUNTER — Ambulatory Visit: Payer: Medicare Other | Admitting: Physical Therapy

## 2018-11-19 ENCOUNTER — Encounter: Payer: Medicare Other | Admitting: Physical Therapy

## 2018-11-26 ENCOUNTER — Encounter: Payer: Medicare Other | Admitting: Physical Therapy

## 2018-11-28 ENCOUNTER — Encounter: Payer: Medicare Other | Admitting: Physical Therapy

## 2018-12-02 ENCOUNTER — Ambulatory Visit: Payer: Medicare Other | Admitting: Family Medicine

## 2018-12-03 ENCOUNTER — Encounter: Payer: Medicare Other | Admitting: Physical Therapy

## 2018-12-05 ENCOUNTER — Encounter: Payer: Medicare Other | Admitting: Physical Therapy

## 2018-12-10 ENCOUNTER — Encounter: Payer: Medicare Other | Admitting: Physical Therapy

## 2018-12-10 DIAGNOSIS — H524 Presbyopia: Secondary | ICD-10-CM | POA: Diagnosis not present

## 2018-12-10 DIAGNOSIS — Z961 Presence of intraocular lens: Secondary | ICD-10-CM | POA: Diagnosis not present

## 2018-12-12 ENCOUNTER — Encounter: Payer: Medicare Other | Admitting: Physical Therapy

## 2018-12-17 ENCOUNTER — Encounter: Payer: Medicare Other | Admitting: Physical Therapy

## 2018-12-19 ENCOUNTER — Encounter: Payer: Medicare Other | Admitting: Physical Therapy

## 2019-01-01 ENCOUNTER — Other Ambulatory Visit: Payer: Self-pay

## 2019-01-01 NOTE — Telephone Encounter (Signed)
[facility] pharmacy sent a request to refill Zolpidem 10 mg. I do not see this medication on patient's med list.

## 2019-01-01 NOTE — Telephone Encounter (Signed)
This was not discussed at new patient visit as one of her medications.  Looks like it is not a chronic medication and last filled #14 in 11/2017.  We can discuss further at upcoming appt if necessary, but no refills at this time  Harden Bramer, Dionne Bucy, MD, MPH [place]

## 2019-01-01 NOTE — Telephone Encounter (Signed)
Patient has a follow up scheduled tomorrow 01/02/2019

## 2019-01-01 NOTE — Progress Notes (Signed)
Patient: Dawn Bruce Female    DOB: 1946/05/03   73 y.o.   MRN: [id number] Visit Date: 01/03/2019  Today's Provider: Lavon Paganini, MD   Chief Complaint  Patient presents with  . Hypertension  . Hyperlipidemia  . Gastroesophageal Reflux   Subjective:    I, Tiburcio Pea, CMA, am acting as a scribe for Lavon Paganini, MD.   HPI  Hypertension, follow-up:  BP Readings from Last 3 Encounters:  01/03/19 (!) 144/71  10/02/18 130/80  06/17/18 (!) 185/71    She was last seen for hypertension 3 months ago.  BP at that visit was 130/80. Management changes since that visit include continue medications. She reports good compliance with treatment. She is not having side effects.  She is exercising occasionally. She is not adherent to low salt diet.   Outside blood pressures are not being checked at home.. She is experiencing none.  Patient denies chest pain, chest pressure/discomfort, claudication, dyspnea, exertional chest pressure/discomfort, fatigue, irregular heart beat, lower extremity edema, near-syncope, orthopnea, palpitations, paroxysmal nocturnal dyspnea, syncope and tachypnea.   Cardiovascular risk factors include advanced age (older than 68 for men, 50 for women), dyslipidemia and hypertension.  Use of agents associated with hypertension: none.     Weight trend: stable Wt Readings from Last 3 Encounters:  01/02/19 132 lb 6.4 oz (60.1 kg)  06/17/18 130 lb (59 kg)  06/06/18 130 lb (59 kg)    ------------------------------------------------------------------------  Lipid/Cholesterol, Follow-up:   Last seen for this3 months ago.  Management changes since that visit include no changes. . Last Lipid Panel:    Component Value Date/Time   CHOL 168 01/02/2019 1402   TRIG 66 01/02/2019 1402   HDL 77 01/02/2019 1402   CHOLHDL 2.2 01/02/2019 1402   LDLCALC 78 01/02/2019 1402    Risk factors for vascular disease include hypertension  She reports good  compliance with treatment. She is not having side effects.  Current symptoms include none  Weight trend: stable Current exercise: occasionally walking   Wt Readings from Last 3 Encounters:  01/02/19 132 lb 6.4 oz (60.1 kg)  06/17/18 130 lb (59 kg)  06/06/18 130 lb (59 kg)    -------------------------------------------------------------------  GERD, Follow up:  The patient was last seen for GERD 3 months ago. Changes made since that visit include no changes.  She reports good compliance with treatment. She is not having side effects.  She IS experiencing nothing. She is NOT experiencing abdominal bloating, belching, belching and eructation, bilious reflux, chest pain, choking on food, cough, deep pressure at base of neck, difficulty swallowing, dysphagia, early satiety, fullness after meals, heartburn, hematemesis, hoarseness, laryngitis, melena, midespigastric pain, nausea, need to clear throat frequently, nocturnal burning or odynophagia  ------------------------------------------------------------------------   No Known Allergies   Current Outpatient Medications:  .  aspirin 81 MG chewable tablet, Chew by mouth daily., Disp: , Rfl:  .  diltiazem (CARDIZEM SR) 120 MG 12 hr capsule, Take 1 capsule (120 mg total) by mouth 2 (two) times daily., Disp: 90 capsule, Rfl: 1 .  escitalopram (LEXAPRO) 10 MG tablet, Take 1 tablet (10 mg total) by mouth daily., Disp: 90 tablet, Rfl: 1 .  famotidine (PEPCID) 40 MG tablet, Take 40 mg by mouth daily., Disp: , Rfl:  .  fentaNYL (DURAGESIC) 50 MCG/HR, Place 1 patch onto the skin every 3 (three) days., Disp: 10 patch, Rfl: 0 .  fentaNYL (DURAGESIC) 50 MCG/HR, Place 1 patch onto the skin every 3 (three) days., Disp:  10 patch, Rfl: 0 .  pregabalin (LYRICA) 150 MG capsule, Take 1 capsule (150 mg total) by mouth 2 (two) times daily., Disp: 180 capsule, Rfl: 1 .  atorvastatin (LIPITOR) 80 MG tablet, Take 1 tablet (80 mg total) by mouth daily., Disp:  90 tablet, Rfl: 3   Review of Systems  Constitutional: Negative.   Respiratory: Negative.   Cardiovascular: Negative.   Gastrointestinal: Negative.   Musculoskeletal: Negative.     Social History   Tobacco Use  . Smoking status: Never Smoker  . Smokeless tobacco: Never Used  Substance Use Topics  . Alcohol use: Yes    Alcohol/week: 21.0 standard drinks    Types: 21 Standard drinks or equivalent per week      Objective:   BP (!) 144/71 (BP Location: Right Arm, Patient Position: Sitting, Cuff Size: Normal)   Pulse 70   Temp 98.6 F (37 C) (Oral)   Wt 132 lb 6.4 oz (60.1 kg)   SpO2 97%   BMI 23.45 kg/m  Vitals:   01/02/19 1326 01/03/19 0910  BP: (!) 154/75 (!) 144/71  Pulse: 70   Temp: 98.6 F (37 C)   TempSrc: Oral   SpO2: 97%   Weight: 132 lb 6.4 oz (60.1 kg)      Physical Exam Vitals signs reviewed.  Constitutional:      General: She is not in acute distress.    Appearance: Normal appearance. She is well-developed. She is not diaphoretic.  HENT:     Head: Normocephalic and atraumatic.  Eyes:     General: No scleral icterus.    Conjunctiva/sclera: Conjunctivae normal.  Neck:     Musculoskeletal: Neck supple.     Thyroid: No thyromegaly.  Cardiovascular:     Rate and Rhythm: Normal rate and regular rhythm.     Pulses: Normal pulses.     Heart sounds: Normal heart sounds. No murmur.  Pulmonary:     Effort: Pulmonary effort is normal. No respiratory distress.     Breath sounds: Normal breath sounds. No wheezing, rhonchi or rales.  Abdominal:     General: There is no distension.     Palpations: Abdomen is soft.     Tenderness: There is no abdominal tenderness.  Musculoskeletal:     Right lower leg: No edema.     Left lower leg: No edema.  Lymphadenopathy:     Cervical: No cervical adenopathy.  Skin:    General: Skin is warm and dry.     Capillary Refill: Capillary refill takes less than 2 seconds.     Findings: No rash.  Neurological:      Mental Status: She is alert and oriented to person, place, and time. Mental status is at baseline.  Psychiatric:        Mood and Affect: Mood normal.        Behavior: Behavior normal.      Results for orders placed or performed in visit on 01/02/19  Lipid panel  Result Value Ref Range   Cholesterol, Total 168 100 - 199 mg/dL   Triglycerides 66 0 - 149 mg/dL   HDL 77 >39 mg/dL   VLDL Cholesterol Cal 13 5 - 40 mg/dL   LDL Calculated 78 0 - 99 mg/dL   Chol/HDL Ratio 2.2 0.0 - 4.4 ratio  Comprehensive metabolic panel  Result Value Ref Range   Glucose 111 (H) 65 - 99 mg/dL   BUN 20 8 - 27 mg/dL   Creatinine, Ser 1.60 (H) 0.57 -  1.00 mg/dL   GFR calc non Af Amer 32 (L) >59 mL/min/1.73   GFR calc Af Amer 36 (L) >59 mL/min/1.73   BUN/Creatinine Ratio 13 12 - 28   Sodium 146 (H) 134 - 144 mmol/L   Potassium 4.0 3.5 - 5.2 mmol/L   Chloride 106 96 - 106 mmol/L   CO2 25 20 - 29 mmol/L   Calcium 9.3 8.7 - 10.3 mg/dL   Total Protein 6.2 6.0 - 8.5 g/dL   Albumin 4.2 3.7 - 4.7 g/dL   Globulin, Total 2.0 1.5 - 4.5 g/dL   Albumin/Globulin Ratio 2.1 1.2 - 2.2   Bilirubin Total 0.5 0.0 - 1.2 mg/dL   Alkaline Phosphatase 76 39 - 117 IU/L   AST 27 0 - 40 IU/L   ALT 19 0 - 32 IU/L  CBC w/Diff/Platelet  Result Value Ref Range   WBC 6.8 3.4 - 10.8 x10E3/uL   RBC 4.95 3.77 - 5.28 x10E6/uL   Hemoglobin 14.7 11.1 - 15.9 g/dL   Hematocrit 43.9 34.0 - 46.6 %   MCV 89 79 - 97 fL   MCH 29.7 26.6 - 33.0 pg   MCHC 33.5 31.5 - 35.7 g/dL   RDW 13.9 11.7 - 15.4 %   Platelets 179 150 - 450 x10E3/uL   Neutrophils 73 Not Estab. %   Lymphs 15 Not Estab. %   Monocytes 9 Not Estab. %   Eos 3 Not Estab. %   Basos 0 Not Estab. %   Neutrophils Absolute 5.0 1.4 - 7.0 x10E3/uL   Lymphocytes Absolute 1.0 0.7 - 3.1 x10E3/uL   Monocytes Absolute 0.6 0.1 - 0.9 x10E3/uL   EOS (ABSOLUTE) 0.2 0.0 - 0.4 x10E3/uL   Basophils Absolute 0.0 0.0 - 0.2 x10E3/uL   Immature Granulocytes 0 Not Estab. %   Immature Grans  (Abs) 0.0 0.0 - 0.1 x10E3/uL       Assessment & Plan   Problem List Items Addressed This Visit      Cardiovascular and Mediastinum   Essential hypertension - Primary    Previously well controlled Slightly elevated today, but has not yet taken her medications Continue diltiazem If still elevated at next visit, will increase Diltiazem      Relevant Orders   Comprehensive metabolic panel (Completed)     Digestive   GERD (gastroesophageal reflux disease)    Chronic and well-controlled Continue H2 blocker      Relevant Orders   CBC w/Diff/Platelet (Completed)     Genitourinary   CKD (chronic kidney disease) stage 3, GFR 30-59 ml/min (HCC)    Patient reports she is now seeing nephrology Reviewed renal ultrasound with the patient Discussed importance of blood pressure control Avoid nephrotoxic medications Recheck metabolic panel        Other   Chronic pain syndrome (Chronic)    Chronic and well-controlled on current regimen Continue fentanyl patch and Lyrica      Long term current use of opiate analgesic (Chronic)    Chronic and well-controlled As above we will continue Lyrica and fentanyl Patient has pain contract      Chronic forearm pain (Primary Area of Pain) (Left) (Chronic)    Chronic pain of left forearm related to previous injury and nerve injury Continue fentanyl and Lyrica Patient has a pain contract      Neurogenic pain (Chronic)    As above Previous nerve injury Continue Lyrica and fentanyl      Hyperlipidemia    Continue Lipitor and aspirin Discussed importance of  good control given history of carotid stenosis Last labs not available Goal LDL less than 70 Recheck fasting lipid panel and CMP      Relevant Orders   Lipid panel (Completed)   Comprehensive metabolic panel (Completed)   GAD (generalized anxiety disorder)    Chronic and well-controlled Continue Lexapro 10 mg daily Discussed importance of therapy Repeat GAD-7 and PHQ-9 at  next visit          Return in about 2 months (around 03/04/2019) for CPE/AWV.   The entirety of the information documented in the History of Present Illness, Review of Systems and Physical Exam were personally obtained by me. Portions of this information were initially documented by [facility], CMA and reviewed by me for thoroughness and accuracy.    Meliton Samad, Dionne Bucy, MD MPH [facility]

## 2019-01-02 ENCOUNTER — Encounter: Payer: Self-pay | Admitting: Family Medicine

## 2019-01-02 ENCOUNTER — Other Ambulatory Visit: Payer: Self-pay

## 2019-01-02 ENCOUNTER — Ambulatory Visit (INDEPENDENT_AMBULATORY_CARE_PROVIDER_SITE_OTHER): Payer: Medicare Other | Admitting: Family Medicine

## 2019-01-02 VITALS — BP 144/71 | HR 70 | Temp 98.6°F | Wt 132.4 lb

## 2019-01-02 DIAGNOSIS — Z79891 Long term (current) use of opiate analgesic: Secondary | ICD-10-CM

## 2019-01-02 DIAGNOSIS — N289 Disorder of kidney and ureter, unspecified: Secondary | ICD-10-CM | POA: Diagnosis not present

## 2019-01-02 DIAGNOSIS — N183 Chronic kidney disease, stage 3 unspecified: Secondary | ICD-10-CM

## 2019-01-02 DIAGNOSIS — I10 Essential (primary) hypertension: Secondary | ICD-10-CM

## 2019-01-02 DIAGNOSIS — K219 Gastro-esophageal reflux disease without esophagitis: Secondary | ICD-10-CM | POA: Diagnosis not present

## 2019-01-02 DIAGNOSIS — G894 Chronic pain syndrome: Secondary | ICD-10-CM

## 2019-01-02 DIAGNOSIS — M79632 Pain in left forearm: Secondary | ICD-10-CM

## 2019-01-02 DIAGNOSIS — E782 Mixed hyperlipidemia: Secondary | ICD-10-CM

## 2019-01-02 DIAGNOSIS — F411 Generalized anxiety disorder: Secondary | ICD-10-CM

## 2019-01-02 DIAGNOSIS — M792 Neuralgia and neuritis, unspecified: Secondary | ICD-10-CM

## 2019-01-02 NOTE — Assessment & Plan Note (Signed)
Chronic and well-controlled Continue H2 blocker

## 2019-01-02 NOTE — Assessment & Plan Note (Signed)
Continue Lipitor and aspirin Discussed importance of good control given history of carotid stenosis Last labs not available Goal LDL less than 70 Recheck fasting lipid panel and CMP

## 2019-01-02 NOTE — Assessment & Plan Note (Signed)
Chronic pain of left forearm related to previous injury and nerve injury Continue fentanyl and Lyrica Patient has a pain contract

## 2019-01-02 NOTE — Assessment & Plan Note (Signed)
As above Previous nerve injury Continue Lyrica and fentanyl

## 2019-01-02 NOTE — Assessment & Plan Note (Signed)
Patient reports she is now seeing nephrology Reviewed renal ultrasound with the patient Discussed importance of blood pressure control Avoid nephrotoxic medications Recheck metabolic panel

## 2019-01-02 NOTE — Assessment & Plan Note (Signed)
Chronic and well-controlled As above we will continue Lyrica and fentanyl Patient has pain contract

## 2019-01-02 NOTE — Assessment & Plan Note (Signed)
Chronic and well-controlled on current regimen Continue fentanyl patch and Lyrica

## 2019-01-02 NOTE — Assessment & Plan Note (Signed)
Chronic and well-controlled Continue Lexapro 10 mg daily Discussed importance of therapy Repeat GAD-7 and PHQ-9 at next visit

## 2019-01-03 ENCOUNTER — Telehealth: Payer: Self-pay

## 2019-01-03 DIAGNOSIS — N289 Disorder of kidney and ureter, unspecified: Secondary | ICD-10-CM

## 2019-01-03 LAB — CBC WITH DIFFERENTIAL/PLATELET
Basophils Absolute: 0 10*3/uL (ref 0.0–0.2)
Basos: 0 %
EOS (ABSOLUTE): 0.2 10*3/uL (ref 0.0–0.4)
Eos: 3 %
Hematocrit: 43.9 % (ref 34.0–46.6)
Hemoglobin: 14.7 g/dL (ref 11.1–15.9)
Immature Grans (Abs): 0 10*3/uL (ref 0.0–0.1)
Immature Granulocytes: 0 %
Lymphocytes Absolute: 1 10*3/uL (ref 0.7–3.1)
Lymphs: 15 %
MCH: 29.7 pg (ref 26.6–33.0)
MCHC: 33.5 g/dL (ref 31.5–35.7)
MCV: 89 fL (ref 79–97)
Monocytes Absolute: 0.6 10*3/uL (ref 0.1–0.9)
Monocytes: 9 %
Neutrophils Absolute: 5 10*3/uL (ref 1.4–7.0)
Neutrophils: 73 %
Platelets: 179 10*3/uL (ref 150–450)
RBC: 4.95 x10E6/uL (ref 3.77–5.28)
RDW: 13.9 % (ref 11.7–15.4)
WBC: 6.8 10*3/uL (ref 3.4–10.8)

## 2019-01-03 LAB — LIPID PANEL
Chol/HDL Ratio: 2.2 ratio (ref 0.0–4.4)
Cholesterol, Total: 168 mg/dL (ref 100–199)
HDL: 77 mg/dL (ref >39)
LDL Calculated: 78 mg/dL (ref 0–99)
Triglycerides: 66 mg/dL (ref 0–149)
VLDL Cholesterol Cal: 13 mg/dL (ref 5–40)

## 2019-01-03 LAB — COMPREHENSIVE METABOLIC PANEL WITH GFR
ALT: 19 [IU]/L (ref 0–32)
AST: 27 [IU]/L (ref 0–40)
Albumin/Globulin Ratio: 2.1 (ref 1.2–2.2)
Albumin: 4.2 g/dL (ref 3.7–4.7)
Alkaline Phosphatase: 76 [IU]/L (ref 39–117)
BUN/Creatinine Ratio: 13 (ref 12–28)
BUN: 20 mg/dL (ref 8–27)
Bilirubin Total: 0.5 mg/dL (ref 0.0–1.2)
CO2: 25 mmol/L (ref 20–29)
Calcium: 9.3 mg/dL (ref 8.7–10.3)
Chloride: 106 mmol/L (ref 96–106)
Creatinine, Ser: 1.6 mg/dL — ABNORMAL HIGH (ref 0.57–1.00)
GFR calc Af Amer: 36 mL/min/{1.73_m2} — ABNORMAL LOW
GFR calc non Af Amer: 32 mL/min/{1.73_m2} — ABNORMAL LOW
Globulin, Total: 2 g/dL (ref 1.5–4.5)
Glucose: 111 mg/dL — ABNORMAL HIGH (ref 65–99)
Potassium: 4 mmol/L (ref 3.5–5.2)
Sodium: 146 mmol/L — ABNORMAL HIGH (ref 134–144)
Total Protein: 6.2 g/dL (ref 6.0–8.5)

## 2019-01-03 MED ORDER — ATORVASTATIN CALCIUM 80 MG PO TABS: 80.0000 mg | ORAL_TABLET | Freq: Every day | ORAL | 3 refills | Status: DC

## 2019-01-03 NOTE — Assessment & Plan Note (Signed)
Previously well controlled Slightly elevated today, but has not yet taken her medications Continue diltiazem If still elevated at next visit, will increase Diltiazem

## 2019-01-03 NOTE — Telephone Encounter (Signed)
Patient advised. She has been taking Atorvastatin 40 mg regularly. New RX for 80 mg sent to [facility]. Lab slip printed at the front desk for pick up in 2 weeks.

## 2019-01-03 NOTE — Telephone Encounter (Signed)
-----   Message from Virginia Crews, MD sent at 01/03/2019  8:12 AM EDT ----- Cholesterol is not quite to goal of LDL <70 given h/o carotid artery stenosis.  Taking Atorvastatin regularly? If so, would increase to 80mg  daily.  Kidney function is slightly worse than it was ~7 months ago.  Sodium is elevated.  These are signs of dehydration.  Be sure to hydrate well and recheck renal funciton panel in 2 wks.  Normal blood counts.

## 2019-01-24 DIAGNOSIS — N289 Disorder of kidney and ureter, unspecified: Secondary | ICD-10-CM | POA: Diagnosis not present

## 2019-01-25 LAB — RENAL FUNCTION PANEL
Albumin: 4.1 g/dL (ref 3.7–4.7)
BUN/Creatinine Ratio: 17 (ref 12–28)
BUN: 20 mg/dL (ref 8–27)
CO2: 25 mmol/L (ref 20–29)
Calcium: 9.1 mg/dL (ref 8.7–10.3)
Chloride: 102 mmol/L (ref 96–106)
Creatinine, Ser: 1.19 mg/dL — ABNORMAL HIGH (ref 0.57–1.00)
GFR calc Af Amer: 52 mL/min/{1.73_m2} — ABNORMAL LOW (ref >59)
GFR calc non Af Amer: 45 mL/min/{1.73_m2} — ABNORMAL LOW (ref >59)
Glucose: 101 mg/dL — ABNORMAL HIGH (ref 65–99)
Phosphorus: 3.9 mg/dL (ref 3.0–4.3)
Potassium: 4.4 mmol/L (ref 3.5–5.2)
Sodium: 141 mmol/L (ref 134–144)

## 2019-01-29 ENCOUNTER — Telehealth: Payer: Self-pay

## 2019-01-29 NOTE — Telephone Encounter (Signed)
LMTCB 01/29/2019  Thanks,   -Mickel Baas

## 2019-01-29 NOTE — Telephone Encounter (Signed)
-----   Message from Virginia Crews, MD sent at 01/29/2019 12:39 PM EDT ----- Kidney function much improved from 3 wks ago

## 2019-01-30 NOTE — Telephone Encounter (Signed)
Patient advised.

## 2019-02-03 ENCOUNTER — Encounter: Payer: Self-pay | Admitting: Family Medicine

## 2019-02-05 ENCOUNTER — Other Ambulatory Visit: Payer: Self-pay | Admitting: Family Medicine

## 2019-02-13 DIAGNOSIS — L570 Actinic keratosis: Secondary | ICD-10-CM | POA: Diagnosis not present

## 2019-02-13 DIAGNOSIS — L578 Other skin changes due to chronic exposure to nonionizing radiation: Secondary | ICD-10-CM | POA: Diagnosis not present

## 2019-02-13 DIAGNOSIS — L820 Inflamed seborrheic keratosis: Secondary | ICD-10-CM | POA: Diagnosis not present

## 2019-02-14 ENCOUNTER — Other Ambulatory Visit: Payer: Self-pay | Admitting: Family Medicine

## 2019-04-07 DIAGNOSIS — N183 Chronic kidney disease, stage 3 unspecified: Secondary | ICD-10-CM | POA: Diagnosis not present

## 2019-04-07 DIAGNOSIS — Z1159 Encounter for screening for other viral diseases: Secondary | ICD-10-CM | POA: Diagnosis not present

## 2019-04-07 DIAGNOSIS — I129 Hypertensive chronic kidney disease with stage 1 through stage 4 chronic kidney disease, or unspecified chronic kidney disease: Secondary | ICD-10-CM | POA: Diagnosis not present

## 2019-04-07 DIAGNOSIS — E559 Vitamin D deficiency, unspecified: Secondary | ICD-10-CM | POA: Diagnosis not present

## 2019-04-07 NOTE — Progress Notes (Signed)
Subjective:   Dawn Bruce is a 73 y.o. female who presents for an Initial Medicare Annual Wellness Visit.    This visit is being conducted through telemedicine due to the COVID-19 pandemic. This patient has given me verbal consent via doximity to conduct this visit, patient states they are participating from their home address. Some vital signs may be absent or patient reported.    Patient identification: identified by name, DOB, and current address  Review of Systems    N/A  Cardiac Risk Factors include: advanced age (>37men, >108 women);dyslipidemia;hypertension;sedentary lifestyle     Objective:    Today's Vitals   04/08/19 1324  PainSc: 0-No pain   There is no height or weight on file to calculate BMI. Unable to obtain vitals due to visit being conducted via telephonically.   Advanced Directives 04/08/2019  Does Patient Have a Medical Advance Directive? Yes  Type of Advance Directive Living will    Current Medications (verified) Outpatient Encounter Medications as of 04/08/2019  Medication Sig  . aspirin 81 MG chewable tablet Chew by mouth daily.  Marland Kitchen atorvastatin (LIPITOR) 40 MG tablet TAKE 1 TABLET BY MOUTH EVERY DAY  . Cholecalciferol (VITAMIN D3) 50 MCG (2000 UT) capsule Take 2,000 Units by mouth daily.  Marland Kitchen diltiazem (CARDIZEM SR) 120 MG 12 hr capsule TAKE 1 CAPSULE (120 MG TOTAL) BY MOUTH 2 (TWO) TIMES DAILY.  Marland Kitchen escitalopram (LEXAPRO) 10 MG tablet TAKE 1 TABLET BY MOUTH EVERY DAY  . famotidine (PEPCID) 40 MG tablet Take 40 mg by mouth daily.  . fentaNYL (DURAGESIC) 50 MCG/HR Place 1 patch onto the skin every 3 (three) days.  . pregabalin (LYRICA) 150 MG capsule Take 1 capsule (150 mg total) by mouth 2 (two) times daily.  Marland Kitchen atorvastatin (LIPITOR) 80 MG tablet Take 1 tablet (80 mg total) by mouth daily. (Patient not taking: Reported on 04/08/2019)  . fentaNYL (DURAGESIC) 50 MCG/HR Place 1 patch onto the skin every 3 (three) days. (Patient not taking: Reported on  04/08/2019)   No facility-administered encounter medications on file as of 04/08/2019.     Allergies (verified) Patient has no known allergies.   History: Past Medical History:  Diagnosis Date  . Chronic pain   . Hypertension    Past Surgical History:  Procedure Laterality Date  . ABDOMINAL HYSTERECTOMY    . CAROTID ARTERY ANGIOPLASTY Right 1995(approximate)  . CAROTID ENDARTERECTOMY  2001  . COSMETIC SURGERY    . ELBOW SURGERY    . TONSILLECTOMY     Family History  Problem Relation Age of Onset  . Cancer Mother   . Cancer Father    Social History   Socioeconomic History  . Marital status: Married    Spouse name: Not on file  . Number of children: 3  . Years of education: Not on file  . Highest education level: Some college, no degree  Occupational History  . Occupation: retired  Scientific laboratory technician  . Financial resource strain: Not hard at all  . Food insecurity    Worry: Never true    Inability: Never true  . Transportation needs    Medical: No    Non-medical: No  Tobacco Use  . Smoking status: Former Smoker    Types: Cigarettes  . Smokeless tobacco: Never Used  . Tobacco comment: quit around 1990-1995 (?)  Substance and Sexual Activity  . Alcohol use: Yes    Alcohol/week: 21.0 standard drinks    Types: 21 Shots of liquor per week  Comment: 3 scotch drinks a night  . Drug use: Never  . Sexual activity: Not on file  Lifestyle  . Physical activity    Days per week: 0 days    Minutes per session: 0 min  . Stress: Not at all  Relationships  . Social Herbalist on phone: Patient refused    Gets together: Patient refused    Attends religious service: Patient refused    Active member of club or organization: Patient refused    Attends meetings of clubs or organizations: Patient refused    Relationship status: Patient refused  Other Topics Concern  . Not on file  Social History Narrative  . Not on file    Tobacco Counseling Counseling  given: Not Answered Comment: quit around 1990-1995 (?)   Clinical Intake:  Pre-visit preparation completed: Yes  Pain : No/denies pain Pain Score: 0-No pain     Nutritional Risks: None Diabetes: No  How often do you need to have someone help you when you read instructions, pamphlets, or other written materials from your doctor or pharmacy?: 1 - Never  Interpreter Needed?: No  Information entered by :: [facility], LPN   Activities of Daily Living In your present state of health, do you have any difficulty performing the following activities: 04/08/2019 10/02/2018  Hearing? N N  Vision? N N  Difficulty concentrating or making decisions? N N  Walking or climbing stairs? N N  Dressing or bathing? N N  Doing errands, shopping? N N  Preparing Food and eating ? N -  Using the Toilet? N -  In the past six months, have you accidently leaked urine? Y -  Comment Occasionally with urges. -  Do you have problems with loss of bowel control? N -  Managing your Medications? N -  Managing your Finances? N -  Housekeeping or managing your Housekeeping? N -     Immunizations and Health Maintenance Immunization History  Administered Date(s) Administered  . Hepatitis A 02/07/2011, 08/07/2011  . Pneumococcal Conjugate-13 02/05/2014  . Pneumococcal Polysaccharide-23 12/16/2009, 04/18/2016  . Tdap 09/28/2007  . Varicella 09/27/2009, 11/01/2017   Health Maintenance Due  Topic Date Due  . Hepatitis C Screening  [birth year]  . COLONOSCOPY  03/07/1994  . DEXA SCAN  03/07/2009  . INFLUENZA VACCINE  12/21/2018    Patient Care Team: Virginia Crews, MD as PCP - General (Family Medicine) Jamal Maes, MD as Consulting Physician (Nephrology) Ralene Bathe, MD (Dermatology)  Indicate any recent Medical Services you may have received from other than [facility] providers in the past year (date may be approximate).     Assessment:   This is a routine wellness examination for [facility].   Hearing/Vision screen No exam data present  Dietary issues and exercise activities discussed: Current Exercise Habits: The patient does not participate in regular exercise at present, Exercise limited by: None identified  Goals    . DIET - INCREASE WATER INTAKE     Recommend to drink at least 6-8 8oz glasses of water per day.      Depression Screen PHQ 2/9 Scores 04/08/2019 10/02/2018 06/17/2018  PHQ - 2 Score 0 0 0    Fall Risk Fall Risk  04/08/2019 10/02/2018 06/17/2018 06/06/2018  Falls in the past year? 0 0 0 0  Number falls in past yr: 0 - - -  Injury with Fall? 0 - - -   FALL RISK PREVENTION PERTAINING TO THE HOME:  Any stairs in or  around the home? No  If so, are there any without handrails? N/A  Home free of loose throw rugs in walkways, pet beds, electrical cords, etc? Yes  Adequate lighting in your home to reduce risk of falls? Yes   ASSISTIVE DEVICES UTILIZED TO PREVENT FALLS:  Life alert? No  Use of a cane, walker or w/c? No  Grab bars in the bathroom? No  Shower chair or bench in shower? No  Elevated toilet seat or a handicapped toilet? No    TIMED UP AND GO:  Was the test performed? No .     Cognitive Function:     6CIT Screen 04/08/2019  What Year? 0 points  What month? 0 points  What time? 0 points  Count back from 20 0 points  Months in reverse 0 points  Repeat phrase 0 points  Total Score 0    Screening Tests Health Maintenance  Topic Date Due  . Hepatitis C Screening  02-May-1946  . COLONOSCOPY  03/07/1994  . DEXA SCAN  03/07/2009  . INFLUENZA VACCINE  12/21/2018  . TETANUS/TDAP  04/07/2020 (Originally 09/27/2017)  . PNA vac Low Risk Adult  Completed    Qualifies for Shingles Vaccine? Yes . Due for Shingrix. Education has been provided regarding the importance of this vaccine. Pt has been advised to call insurance company to determine out of pocket expense. Advised may also receive vaccine at local pharmacy or Health Dept. Verbalized  acceptance and understanding.  Tdap: Although this vaccine is not a covered service during a Wellness Exam, does the patient still wish to receive this vaccine today?  No .   Flu Vaccine: Due for Flu vaccine. Does the patient want to receive this vaccine today?  No .   Pneumococcal Vaccine: Completed series  Cancer Screenings:  Colorectal Screening:. Referral to GI placed today. Pt aware the office will call re: appt.  Mammogram: Currently due. Pt declined order today.   Bone Density: Ordered today. Pt provided with contact info and advised to call to schedule appt. Pt aware the office will call re: appt.  Lung Cancer Screening: (Low Dose CT Chest recommended if Age 24-80 years, 30 pack-year currently smoking OR have quit w/in 15years.) does not qualify.   Additional Screening:  Hepatitis C Screening: does qualify; and would like this added to tomorrows blood work orders.   Vision Screening: Recommended annual ophthalmology exams for early detection of glaucoma and other disorders of the eye.  Dental Screening: Recommended annual dental exams for proper oral hygiene  Community Resource Referral:  CRR required this visit?  No       Plan:  I have personally reviewed and addressed the Medicare Annual Wellness questionnaire and have noted the following in the patient's chart:  A. Medical and social history B. Use of alcohol, tobacco or illicit drugs  C. Current medications and supplements D. Functional ability and status E.  Nutritional status F.  Physical activity G. Advance directives H. List of other physicians I.  Hospitalizations, surgeries, and ER visits in previous 12 months J.  [place] such as hearing and vision if needed, cognitive and depression L. Referrals and appointments   In addition, I have reviewed and discussed with patient certain preventive protocols, quality metrics, and best practice recommendations. A written personalized care plan for  preventive services as well as general preventive health recommendations were provided to patient.   Glendora Score, LPN   [phone]  Nurse Health Advisor  Nurse Notes: Pt to receive a flu shot and check her Hep C lab at [place]. Pt declined mammogram order at this time.

## 2019-04-08 ENCOUNTER — Ambulatory Visit (INDEPENDENT_AMBULATORY_CARE_PROVIDER_SITE_OTHER): Payer: Medicare Other

## 2019-04-08 ENCOUNTER — Other Ambulatory Visit: Payer: Self-pay

## 2019-04-08 DIAGNOSIS — E2839 Other primary ovarian failure: Secondary | ICD-10-CM | POA: Diagnosis not present

## 2019-04-08 DIAGNOSIS — Z1211 Encounter for screening for malignant neoplasm of colon: Secondary | ICD-10-CM

## 2019-04-08 DIAGNOSIS — Z0000 Encounter for general adult medical examination without abnormal findings: Secondary | ICD-10-CM | POA: Diagnosis not present

## 2019-04-08 NOTE — Patient Instructions (Signed)
Dawn Bruce , Thank you for taking time to come for your Medicare Wellness Visit. I appreciate your ongoing commitment to your health goals. Please review the following plan we discussed and let me know if I can assist you in the future.   Screening recommendations/referrals: Colonoscopy: Referral to GI placed today. Pt aware the office will call re: appt. Mammogram: Declined order today.  Bone Density: Referral to GI placed today. Pt aware the office will call re: appt. Recommended yearly ophthalmology/optometry visit for glaucoma screening and checkup Recommended yearly dental visit for hygiene and checkup  Vaccinations: Influenza vaccine: Pt to receive at [facility]. Pneumococcal vaccine: Completed series Tdap vaccine: Pt declines today.  Shingles vaccine: Pt declines today.     Advanced directives: Please bring a copy of your POA (Power of Attorney) and/or Living Will to your next appointment.   Conditions/risks identified: Recommend to drink at least 6-8 8oz glasses of water per day.  Next appointment: 04/09/19 @ 2:00 PM with Dr Brita Romp.   Preventive Care 13 Years and Older, Female Preventive care refers to lifestyle choices and visits with your health care provider that can promote health and wellness. What does preventive care include?  A yearly physical exam. This is also called an annual well check.  Dental exams once or twice a year.  Routine eye exams. Ask your health care provider how often you should have your eyes checked.  Personal lifestyle choices, including:  Daily care of your teeth and gums.  Regular physical activity.  Eating a healthy diet.  Avoiding tobacco and drug use.  Limiting alcohol use.  Practicing safe sex.  Taking low-dose aspirin every day.  Taking vitamin and mineral supplements as recommended by your health care provider. What happens during an annual well check? The services and screenings done by your health care provider  during your annual well check will depend on your age, overall health, lifestyle risk factors, and family history of disease. Counseling  Your health care provider may ask you questions about your:  Alcohol use.  Tobacco use.  Drug use.  Emotional well-being.  Home and relationship well-being.  Sexual activity.  Eating habits.  History of falls.  Memory and ability to understand (cognition).  Work and work Statistician.  Reproductive health. Screening  You may have the following tests or measurements:  Height, weight, and BMI.  Blood pressure.  Lipid and cholesterol levels. These may be checked every 5 years, or more frequently if you are over 55 years old.  Skin check.  Lung cancer screening. You may have this screening every year starting at age 31 if you have a 30-pack-year history of smoking and currently smoke or have quit within the past 15 years.  Fecal occult blood test (FOBT) of the stool. You may have this test every year starting at age 9.  Flexible sigmoidoscopy or colonoscopy. You may have a sigmoidoscopy every 5 years or a colonoscopy every 10 years starting at age 14.  Hepatitis C blood test.  Hepatitis B blood test.  Sexually transmitted disease (STD) testing.  Diabetes screening. This is done by checking your blood sugar (glucose) after you have not eaten for a while (fasting). You may have this done every 1-3 years.  Bone density scan. This is done to screen for osteoporosis. You may have this done starting at age 55.  Mammogram. This may be done every 1-2 years. Talk to your health care provider about how often you should have regular mammograms. Talk with  your health care provider about your test results, treatment options, and if necessary, the need for more tests. Vaccines  Your health care provider may recommend certain vaccines, such as:  Influenza vaccine. This is recommended every year.  Tetanus, diphtheria, and acellular pertussis  (Tdap, Td) vaccine. You may need a Td booster every 10 years.  Zoster vaccine. You may need this after age 52.  Pneumococcal 13-valent conjugate (PCV13) vaccine. One dose is recommended after age 72.  Pneumococcal polysaccharide (PPSV23) vaccine. One dose is recommended after age 30. Talk to your health care provider about which screenings and vaccines you need and how often you need them. This information is not intended to replace advice given to you by your health care provider. Make sure you discuss any questions you have with your health care provider. Document Released: 06/04/2015 Document Revised: 01/26/2016 Document Reviewed: 03/09/2015 Elsevier Interactive Patient Education  2017 [place] Prevention in the Home Falls can cause injuries. They can happen to people of all ages. There are many things you can do to make your home safe and to help prevent falls. What can I do on the outside of my home?  Regularly fix the edges of walkways and driveways and fix any cracks.  Remove anything that might make you trip as you walk through a door, such as a raised step or threshold.  Trim any bushes or trees on the path to your home.  Use bright outdoor lighting.  Clear any walking paths of anything that might make someone trip, such as rocks or tools.  Regularly check to see if handrails are loose or broken. Make sure that both sides of any steps have handrails.  Any raised decks and porches should have guardrails on the edges.  Have any leaves, snow, or ice cleared regularly.  Use sand or salt on walking paths during winter.  Clean up any spills in your garage right away. This includes oil or grease spills. What can I do in the bathroom?  Use night lights.  Install grab bars by the toilet and in the tub and shower. Do not use towel bars as grab bars.  Use non-skid mats or decals in the tub or shower.  If you need to sit down in the shower, use a plastic, non-slip  stool.  Keep the floor dry. Clean up any water that spills on the floor as soon as it happens.  Remove soap buildup in the tub or shower regularly.  Attach bath mats securely with double-sided non-slip rug tape.  Do not have throw rugs and other things on the floor that can make you trip. What can I do in the bedroom?  Use night lights.  Make sure that you have a light by your bed that is easy to reach.  Do not use any sheets or blankets that are too big for your bed. They should not hang down onto the floor.  Have a firm chair that has side arms. You can use this for support while you get dressed.  Do not have throw rugs and other things on the floor that can make you trip. What can I do in the kitchen?  Clean up any spills right away.  Avoid walking on wet floors.  Keep items that you use a lot in easy-to-reach places.  If you need to reach something above you, use a strong step stool that has a grab bar.  Keep electrical cords out of the way.  Do not  use floor polish or wax that makes floors slippery. If you must use wax, use non-skid floor wax.  Do not have throw rugs and other things on the floor that can make you trip. What can I do with my stairs?  Do not leave any items on the stairs.  Make sure that there are handrails on both sides of the stairs and use them. Fix handrails that are broken or loose. Make sure that handrails are as long as the stairways.  Check any carpeting to make sure that it is firmly attached to the stairs. Fix any carpet that is loose or worn.  Avoid having throw rugs at the top or bottom of the stairs. If you do have throw rugs, attach them to the floor with carpet tape.  Make sure that you have a light switch at the top of the stairs and the bottom of the stairs. If you do not have them, ask someone to add them for you. What else can I do to help prevent falls?  Wear shoes that:  Do not have high heels.  Have rubber bottoms.  Are  comfortable and fit you well.  Are closed at the toe. Do not wear sandals.  If you use a stepladder:  Make sure that it is fully opened. Do not climb a closed stepladder.  Make sure that both sides of the stepladder are locked into place.  Ask someone to hold it for you, if possible.  Clearly mark and make sure that you can see:  Any grab bars or handrails.  First and last steps.  Where the edge of each step is.  Use tools that help you move around (mobility aids) if they are needed. These include:  Canes.  Walkers.  Scooters.  Crutches.  Turn on the lights when you go into a dark area. Replace any light bulbs as soon as they burn out.  Set up your furniture so you have a clear path. Avoid moving your furniture around.  If any of your floors are uneven, fix them.  If there are any pets around you, be aware of where they are.  Review your medicines with your doctor. Some medicines can make you feel dizzy. This can increase your chance of falling. Ask your doctor what other things that you can do to help prevent falls. This information is not intended to replace advice given to you by your health care provider. Make sure you discuss any questions you have with your health care provider. Document Released: 03/04/2009 Document Revised: 10/14/2015 Document Reviewed: 06/12/2014 Elsevier Interactive Patient Education  2017 [employer].

## 2019-04-09 ENCOUNTER — Ambulatory Visit: Payer: Medicare Other | Admitting: Family Medicine

## 2019-04-09 ENCOUNTER — Ambulatory Visit (INDEPENDENT_AMBULATORY_CARE_PROVIDER_SITE_OTHER): Payer: Medicare Other | Admitting: Family Medicine

## 2019-04-09 ENCOUNTER — Other Ambulatory Visit: Payer: Self-pay

## 2019-04-09 ENCOUNTER — Encounter: Payer: Self-pay | Admitting: Family Medicine

## 2019-04-09 VITALS — BP 145/77 | HR 67 | Temp 96.9°F | Resp 16 | Ht 63.0 in | Wt 134.0 lb

## 2019-04-09 DIAGNOSIS — M79632 Pain in left forearm: Secondary | ICD-10-CM

## 2019-04-09 DIAGNOSIS — M792 Neuralgia and neuritis, unspecified: Secondary | ICD-10-CM

## 2019-04-09 DIAGNOSIS — Z23 Encounter for immunization: Secondary | ICD-10-CM | POA: Diagnosis not present

## 2019-04-09 DIAGNOSIS — I10 Essential (primary) hypertension: Secondary | ICD-10-CM | POA: Diagnosis not present

## 2019-04-09 DIAGNOSIS — F411 Generalized anxiety disorder: Secondary | ICD-10-CM

## 2019-04-09 DIAGNOSIS — G894 Chronic pain syndrome: Secondary | ICD-10-CM | POA: Diagnosis not present

## 2019-04-09 DIAGNOSIS — Z0000 Encounter for general adult medical examination without abnormal findings: Secondary | ICD-10-CM | POA: Diagnosis not present

## 2019-04-09 DIAGNOSIS — E782 Mixed hyperlipidemia: Secondary | ICD-10-CM

## 2019-04-09 NOTE — Progress Notes (Signed)
Patient: Dawn Bruce, Female    DOB: Jun 14, 1945, 73 y.o.   MRN: [id number] Visit Date: 04/09/2019  Today's Provider: Lavon Paganini, MD   Chief Complaint  Patient presents with  . Annual Exam   Subjective:     Complete Physical Dawn Bruce is a 73 y.o. female. She feels well. She reports exercising daily. She reports she is sleeping well. 04/09/2019 AWV with McKenzie  ----------------------------------------------------------- She is planning for colonoscopy in 08/2019 after returning from [place].  She will spend her winter in [place].  She is awaiting call to schedule DEXA.  Review of Systems  Constitutional: Negative.   HENT: Negative.   Respiratory: Negative.   Cardiovascular: Negative.   Gastrointestinal: Negative.   Endocrine: Negative.   Genitourinary: Negative.   Musculoskeletal: Negative.   Skin: Negative.   Allergic/Immunologic: Negative.   Neurological: Negative.   Hematological: Bruises/bleeds easily.  Psychiatric/Behavioral: Negative.     Social History   Socioeconomic History  . Marital status: Married    Spouse name: Not on file  . Number of children: 3  . Years of education: Not on file  . Highest education level: Some college, no degree  Occupational History  . Occupation: retired  Scientific laboratory technician  . Financial resource strain: Not hard at all  . Food insecurity    Worry: Never true    Inability: Never true  . Transportation needs    Medical: No    Non-medical: No  Tobacco Use  . Smoking status: Former Smoker    Types: Cigarettes  . Smokeless tobacco: Never Used  . Tobacco comment: quit around 1990-1995 (?)  Substance and Sexual Activity  . Alcohol use: Yes    Alcohol/week: 21.0 standard drinks    Types: 21 Shots of liquor per week    Comment: 3 scotch drinks a night  . Drug use: Never  . Sexual activity: Not on file  Lifestyle  . Physical activity    Days per week: 0 days    Minutes per session: 0 min  . Stress: Not at  all  Relationships  . Social Herbalist on phone: Patient refused    Gets together: Patient refused    Attends religious service: Patient refused    Active member of club or organization: Patient refused    Attends meetings of clubs or organizations: Patient refused    Relationship status: Patient refused  . Intimate partner violence    Fear of current or ex partner: Patient refused    Emotionally abused: Patient refused    Physically abused: Patient refused    Forced sexual activity: Patient refused  Other Topics Concern  . Not on file  Social History Narrative  . Not on file    Past Medical History:  Diagnosis Date  . Chronic pain   . Hypertension      Patient Active Problem List   Diagnosis Date Noted  . Stenosis of right carotid artery 10/03/2018  . Hyperlipidemia 10/03/2018  . GERD (gastroesophageal reflux disease) 10/03/2018  . GAD (generalized anxiety disorder) 10/03/2018  . Essential hypertension 10/02/2018  . CKD (chronic kidney disease) stage 3, GFR 30-59 ml/min 06/17/2018  . Chronic forearm pain (Primary Area of Pain) (Left) 06/17/2018  . Neurogenic pain 06/17/2018  . Chronic pain syndrome 06/06/2018  . Long term current use of opiate analgesic 06/06/2018    Past Surgical History:  Procedure Laterality Date  . ABDOMINAL HYSTERECTOMY    . CAROTID ARTERY ANGIOPLASTY  Right 1995(approximate)  . CAROTID ENDARTERECTOMY  2001  . COSMETIC SURGERY    . ELBOW SURGERY    . TONSILLECTOMY      Her family history includes Cancer in her father and mother.   Current Outpatient Medications:  .  aspirin 81 MG chewable tablet, Chew by mouth daily., Disp: , Rfl:  .  atorvastatin (LIPITOR) 40 MG tablet, TAKE 1 TABLET BY MOUTH EVERY DAY, Disp: 90 tablet, Rfl: 1 .  atorvastatin (LIPITOR) 80 MG tablet, Take 1 tablet (80 mg total) by mouth daily., Disp: 90 tablet, Rfl: 3 .  Cholecalciferol (VITAMIN D3) 50 MCG (2000 UT) capsule, Take 2,000 Units by mouth daily.,  Disp: , Rfl:  .  diltiazem (CARDIZEM SR) 120 MG 12 hr capsule, TAKE 1 CAPSULE (120 MG TOTAL) BY MOUTH 2 (TWO) TIMES DAILY., Disp: 90 capsule, Rfl: 1 .  escitalopram (LEXAPRO) 10 MG tablet, TAKE 1 TABLET BY MOUTH EVERY DAY, Disp: 90 tablet, Rfl: 1 .  famotidine (PEPCID) 40 MG tablet, Take 40 mg by mouth daily., Disp: , Rfl:  .  fentaNYL (DURAGESIC) 50 MCG/HR, Place 1 patch onto the skin every 3 (three) days., Disp: 10 patch, Rfl: 0 .  fentaNYL (DURAGESIC) 50 MCG/HR, Place 1 patch onto the skin every 3 (three) days., Disp: 10 patch, Rfl: 0 .  pregabalin (LYRICA) 150 MG capsule, Take 1 capsule (150 mg total) by mouth 2 (two) times daily., Disp: 180 capsule, Rfl: 1  Patient Care Team: Virginia Crews, MD as PCP - General (Family Medicine) Jamal Maes, MD as Consulting Physician (Nephrology) Ralene Bathe, MD (Dermatology)     Objective:    Vitals: BP (!) 145/77 (BP Location: Right Arm, Patient Position: Sitting, Cuff Size: Normal)   Pulse 67   Temp (!) 96.9 F (36.1 C) (Temporal)   Resp 16   Ht 5\' 3"  (1.6 m)   Wt 134 lb (60.8 kg)   BMI 23.74 kg/m   Physical Exam Vitals signs reviewed.  Constitutional:      General: She is not in acute distress.    Appearance: Normal appearance. She is well-developed. She is not diaphoretic.  HENT:     Head: Normocephalic and atraumatic.     Right Ear: Tympanic membrane, ear canal and external ear normal.     Left Ear: Tympanic membrane, ear canal and external ear normal.  Eyes:     General: No scleral icterus.    Conjunctiva/sclera: Conjunctivae normal.     Pupils: Pupils are equal, round, and reactive to light.  Neck:     Musculoskeletal: Neck supple.     Thyroid: No thyromegaly.  Cardiovascular:     Rate and Rhythm: Normal rate and regular rhythm.     Pulses: Normal pulses.     Heart sounds: Normal heart sounds. No murmur.  Pulmonary:     Effort: Pulmonary effort is normal. No respiratory distress.     Breath sounds: Normal  breath sounds. No wheezing or rales.  Abdominal:     General: There is no distension.     Palpations: Abdomen is soft.     Tenderness: There is no abdominal tenderness.  Musculoskeletal:        General: No deformity.     Right lower leg: No edema.     Left lower leg: No edema.  Lymphadenopathy:     Cervical: No cervical adenopathy.  Skin:    General: Skin is warm and dry.     Capillary Refill: Capillary refill takes less  than 2 seconds.     Findings: No rash.  Neurological:     Mental Status: She is alert and oriented to person, place, and time. Mental status is at baseline.  Psychiatric:        Mood and Affect: Mood normal.        Behavior: Behavior normal.        Thought Content: Thought content normal.     Activities of Daily Living In your present state of health, do you have any difficulty performing the following activities: 04/08/2019 10/02/2018  Hearing? N N  Vision? N N  Difficulty concentrating or making decisions? N N  Walking or climbing stairs? N N  Dressing or bathing? N N  Doing errands, shopping? N N  Preparing Food and eating ? N -  Using the Toilet? N -  In the past six months, have you accidently leaked urine? Y -  Comment Occasionally with urges. -  Do you have problems with loss of bowel control? N -  Managing your Medications? N -  Managing your Finances? N -  Housekeeping or managing your Housekeeping? N -  Some recent data might be hidden    Fall Risk Assessment Fall Risk  04/08/2019 10/02/2018 06/17/2018 06/06/2018  Falls in the past year? 0 0 0 0  Number falls in past yr: 0 - - -  Injury with Fall? 0 - - -     Depression Screen PHQ 2/9 Scores 04/08/2019 10/02/2018 06/17/2018  PHQ - 2 Score 0 0 0    6CIT Screen 04/08/2019  What Year? 0 points  What month? 0 points  What time? 0 points  Count back from 20 0 points  Months in reverse 0 points  Repeat phrase 0 points  Total Score 0       Assessment & Plan:    Annual Physical  Reviewed patient's Family Medical History Reviewed and updated list of patient's medical providers Assessment of cognitive impairment was done Assessed patient's functional ability Established a written schedule for health screening [place] Completed and Reviewed  Exercise Activities and Dietary recommendations Goals    . DIET - INCREASE WATER INTAKE     Recommend to drink at least 6-8 8oz glasses of water per day.       Immunization History  Administered Date(s) Administered  . Hepatitis A 02/07/2011, 08/07/2011  . Pneumococcal Conjugate-13 02/05/2014  . Pneumococcal Polysaccharide-23 12/16/2009, 04/18/2016  . Tdap 09/28/2007  . Varicella 09/27/2009, 11/01/2017    Health Maintenance  Topic Date Due  . Hepatitis C Screening  [birth year]  . COLONOSCOPY  03/07/1994  . DEXA SCAN  03/07/2009  . INFLUENZA VACCINE  12/21/2018  . TETANUS/TDAP  04/07/2020 (Originally 09/27/2017)  . PNA vac Low Risk Adult  Completed     Discussed health benefits of physical activity, and encouraged her to engage in regular exercise appropriate for her age and condition.    ------------------------------------------------------------------------------------------------------------  Problem List Items Addressed This Visit      Cardiovascular and Mediastinum   Essential hypertension    Remains well controlled on home readings, but slightly elevated in office today Suspect she has some amount of whitecoat hypertension Continue diltiazem at current dose Reviewed recent metabolic panel Follow-up in 6 months        Other   Chronic pain syndrome (Chronic)    Chronic and well controlled on current regimen Continue fentanyl patch and Lyrica      Chronic forearm pain (Primary Area of Pain) (  Left) (Chronic)    Chronic pain of left forearm related to previous injury and nerve injury Continue fentanyl and Lyrica Patient has a pain contract      Neurogenic pain (Chronic)     As above Previous nerve injury Continue Lyrica and fentanyl      Hyperlipidemia    Reviewed last lipid panel Goal LDL less than 70 Continue Lipitor and aspirin Recheck lipid panel at next visit      GAD (generalized anxiety disorder)    Chronic and well-controlled Continue Lexapro 10 mg daily Repeat GAD-7 and PHQ-9 at next visit       Other Visit Diagnoses    Encounter for annual physical exam    -  Primary   Need for influenza vaccination       Relevant Orders   Flu Vaccine QUAD High Dose(Fluad) (Completed)       Return in about 6 months (around 10/07/2019) for chronic disease f/u.   The entirety of the information documented in the History of Present Illness, Review of Systems and Physical Exam were personally obtained by me. Portions of this information were initially documented by Lynford Humphrey, CMA and reviewed by me for thoroughness and accuracy.    Renaye Janicki, Dionne Bucy, MD MPH [facility]

## 2019-04-09 NOTE — Assessment & Plan Note (Signed)
Remains well controlled on home readings, but slightly elevated in office today Suspect she has some amount of whitecoat hypertension Continue diltiazem at current dose Reviewed recent metabolic panel Follow-up in 6 months

## 2019-04-09 NOTE — Assessment & Plan Note (Signed)
Reviewed last lipid panel Goal LDL less than 70 Continue Lipitor and aspirin Recheck lipid panel at next visit

## 2019-04-09 NOTE — Patient Instructions (Signed)
Preventive Care 75 Years and Older, Female Preventive care refers to lifestyle choices and visits with your health care provider that can promote health and wellness. This includes:  A yearly physical exam. This is also called an annual well check.  Regular dental and eye exams.  Immunizations.  Screening for certain conditions.  Healthy lifestyle choices, such as diet and exercise. What can I expect for my preventive care visit? Physical exam Your health care provider will check:  Height and weight. These may be used to calculate body mass index (BMI), which is a measurement that tells if you are at a healthy weight.  Heart rate and blood pressure.  Your skin for abnormal spots. Counseling Your health care provider may ask you questions about:  Alcohol, tobacco, and drug use.  Emotional well-being.  Home and relationship well-being.  Sexual activity.  Eating habits.  History of falls.  Memory and ability to understand (cognition).  Work and work Statistician.  Pregnancy and menstrual history. What immunizations do I need?  Influenza (flu) vaccine  This is recommended every year. Tetanus, diphtheria, and pertussis (Tdap) vaccine  You may need a Td booster every 10 years. Varicella (chickenpox) vaccine  You may need this vaccine if you have not already been vaccinated. Zoster (shingles) vaccine  You may need this after age 75. Pneumococcal conjugate (PCV13) vaccine  One dose is recommended after age 75. Pneumococcal polysaccharide (PPSV23) vaccine  One dose is recommended after age 72. Measles, mumps, and rubella (MMR) vaccine  You may need at least one dose of MMR if you were born in 1957 or later. You may also need a second dose. Meningococcal conjugate (MenACWY) vaccine  You may need this if you have certain conditions. Hepatitis A vaccine  You may need this if you have certain conditions or if you travel or work in places where you may be exposed  to hepatitis A. Hepatitis B vaccine  You may need this if you have certain conditions or if you travel or work in places where you may be exposed to hepatitis B. Haemophilus influenzae type b (Hib) vaccine  You may need this if you have certain conditions. You may receive vaccines as individual doses or as more than one vaccine together in one shot (combination vaccines). Talk with your health care provider about the risks and benefits of combination vaccines. What tests do I need? Blood tests  Lipid and cholesterol levels. These may be checked every 5 years, or more frequently depending on your overall health.  Hepatitis C test.  Hepatitis B test. Screening  Lung cancer screening. You may have this screening every year starting at age 39 if you have a 30-pack-year history of smoking and currently smoke or have quit within the past 15 years.  Colorectal cancer screening. All adults should have this screening starting at age 75 and continuing until age 15. Your health care provider may recommend screening at age 75 if you are at increased risk. You will have tests every 1-10 years, depending on your results and the type of screening test.  Diabetes screening. This is done by checking your blood sugar (glucose) after you have not eaten for a while (fasting). You may have this done every 75 years.  Mammogram. This may be done every 75 years. Talk with your health care provider about how often you should have regular mammograms.  BRCA-related cancer screening. This may be done if you have a family history of breast, ovarian, tubal, or peritoneal cancers.  Other tests  Sexually transmitted disease (STD) testing.  Bone density scan. This is done to screen for osteoporosis. You may have this done starting at age 75. Follow these instructions at home: Eating and drinking  Eat a diet that includes fresh fruits and vegetables, whole grains, lean protein, and low-fat dairy products. Limit  your intake of foods with high amounts of sugar, saturated fats, and salt.  Take vitamin and mineral supplements as recommended by your health care provider.  Do not drink alcohol if your health care provider tells you not to drink.  If you drink alcohol: ? Limit how much you have to 0-1 drink a day. ? Be aware of how much alcohol is in your drink. In the [place] one drink equals one 12 oz bottle of beer (355 mL), one 5 oz glass of wine (148 mL), or one 1 oz glass of hard liquor (44 mL). Lifestyle  Take daily care of your teeth and gums.  Stay active. Exercise for at least 30 minutes on 5 or more days each week.  Do not use any products that contain nicotine or tobacco, such as cigarettes, e-cigarettes, and chewing tobacco. If you need help quitting, ask your health care provider.  If you are sexually active, practice safe sex. Use a condom or other form of protection in order to prevent STIs (sexually transmitted infections).  Talk with your health care provider about taking a low-dose aspirin or statin. What's next?  Go to your health care provider once a year for a well check visit.  Ask your health care provider how often you should have your eyes and teeth checked.  Stay up to date on all vaccines. This information is not intended to replace advice given to you by your health care provider. Make sure you discuss any questions you have with your health care provider. Document Released: 06/04/2015 Document Revised: 05/02/2018 Document Reviewed: 05/02/2018 Elsevier Patient Education  2020 [employer].

## 2019-04-09 NOTE — Assessment & Plan Note (Signed)
Chronic pain of left forearm related to previous injury and nerve injury Continue fentanyl and Lyrica Patient has a pain contract 

## 2019-04-09 NOTE — Assessment & Plan Note (Signed)
As above Previous nerve injury Continue Lyrica and fentanyl 

## 2019-04-09 NOTE — Assessment & Plan Note (Signed)
Chronic and well-controlled on current regimen Continue fentanyl patch and Lyrica 

## 2019-04-09 NOTE — Assessment & Plan Note (Signed)
Chronic and well-controlled Continue Lexapro 10 mg daily Repeat GAD-7 and PHQ-9 at next visit

## 2019-04-14 DIAGNOSIS — L570 Actinic keratosis: Secondary | ICD-10-CM | POA: Diagnosis not present

## 2019-04-14 DIAGNOSIS — L578 Other skin changes due to chronic exposure to nonionizing radiation: Secondary | ICD-10-CM | POA: Diagnosis not present

## 2019-04-24 ENCOUNTER — Telehealth: Payer: Self-pay

## 2019-04-24 NOTE — Telephone Encounter (Signed)
From [facility]  Copied from [place] [phone]. Topic: General - Inquiry >> Apr 24, 2019 11:03 AM Scherrie Gerlach wrote: Reason for CRM:  pt states  [facility] denied both visits, the [place] and the cpe w/ Dr B. sHe received an appeal form, and we will get as well. Please let her know if she needs to sign anything. DOS 04/08/2019 and 04/09/2019

## 2019-05-05 ENCOUNTER — Encounter: Payer: Self-pay | Admitting: Family Medicine

## 2019-05-06 ENCOUNTER — Encounter: Payer: Self-pay | Admitting: *Deleted

## 2019-05-08 ENCOUNTER — Encounter: Payer: Self-pay | Admitting: Family Medicine

## 2019-05-08 ENCOUNTER — Ambulatory Visit
Admission: RE | Admit: 2019-05-08 | Discharge: 2019-05-08 | Disposition: A | Discharge status: Home / Self Care | Payer: Medicare Other | Source: Ambulatory Visit | Attending: Family Medicine | Admitting: Family Medicine

## 2019-05-08 ENCOUNTER — Telehealth: Payer: Self-pay

## 2019-05-08 DIAGNOSIS — E2839 Other primary ovarian failure: Secondary | ICD-10-CM | POA: Diagnosis not present

## 2019-05-08 DIAGNOSIS — M85851 Other specified disorders of bone density and structure, right thigh: Secondary | ICD-10-CM | POA: Diagnosis not present

## 2019-05-08 NOTE — Telephone Encounter (Signed)
-----   Message from Virginia Crews, MD sent at 05/08/2019  1:47 PM EST ----- Bone density scan shows osteopenia (this is some bone loss, but not as bad as osteoporosis).  Recommend regular weight bearing exercise, avoiding smoking, and adequate Ca (1200mg /day) and Vit D (1000 units daily) via diet or supplement.  We will recheck in 2 years to ensure this hasn't worsened.

## 2019-05-08 NOTE — Telephone Encounter (Signed)
Patient advised as below. Patient verbalizes understanding and is in agreement with treatment plan.  

## 2019-05-18 ENCOUNTER — Other Ambulatory Visit: Payer: Self-pay | Admitting: Family Medicine

## 2019-05-19 ENCOUNTER — Encounter: Payer: Self-pay | Admitting: Family Medicine

## 2019-05-19 MED ORDER — PREGABALIN 150 MG PO CAPS: 150.0000 mg | ORAL_CAPSULE | Freq: Two times a day (BID) | ORAL | 0 refills | Status: DC

## 2019-05-19 NOTE — Telephone Encounter (Signed)
Please review for Dr. B 

## 2019-06-09 ENCOUNTER — Ambulatory Visit: Payer: Medicare Other

## 2019-06-17 ENCOUNTER — Other Ambulatory Visit: Payer: Self-pay | Admitting: Family Medicine

## 2019-06-17 NOTE — Telephone Encounter (Signed)
Pt needs a written Rx for fentaNYL ([place]) 50 MCG/HR  /please advise when the written Rx is ready for her to pick up at the office

## 2019-06-18 MED ORDER — FENTANYL 50 MCG/HR TD PT72: 1.0000 | MEDICATED_PATCH | TRANSDERMAL | 0 refills | Status: DC

## 2019-06-20 ENCOUNTER — Encounter: Payer: Self-pay | Admitting: Family Medicine

## 2019-06-20 NOTE — Telephone Encounter (Signed)
I sent through a refill last week.  Have we received a PA or anything?  Can you check on the status?

## 2019-07-11 ENCOUNTER — Encounter: Payer: Self-pay | Admitting: Family Medicine

## 2019-07-14 NOTE — Telephone Encounter (Signed)
Does he need a referral to Ortho?  I'm a little confused by the message

## 2019-07-16 ENCOUNTER — Encounter: Payer: Self-pay | Admitting: Family Medicine

## 2019-07-21 ENCOUNTER — Encounter: Payer: Self-pay | Admitting: Family Medicine

## 2019-08-05 ENCOUNTER — Encounter: Payer: Self-pay | Admitting: Family Medicine

## 2019-08-06 ENCOUNTER — Other Ambulatory Visit: Payer: Self-pay | Admitting: Family Medicine

## 2019-08-07 MED ORDER — PREGABALIN 150 MG PO CAPS: 150.0000 mg | ORAL_CAPSULE | Freq: Two times a day (BID) | ORAL | 0 refills | Status: DC

## 2019-08-07 NOTE — Telephone Encounter (Signed)
Patient calling to check status of this request.  

## 2019-08-21 ENCOUNTER — Ambulatory Visit: Payer: Medicare Other | Admitting: Family Medicine

## 2019-08-24 ENCOUNTER — Other Ambulatory Visit: Payer: Self-pay | Admitting: Family Medicine

## 2019-08-24 NOTE — Telephone Encounter (Signed)
Requested Prescriptions  Pending Prescriptions Disp Refills  . diltiazem (CARDIZEM CD) 120 MG 24 hr capsule [Pharmacy Med Name: DILTIAZEM 24H ER(CD) 120 MG CP] 180 capsule 0    Sig: TAKE 1 CAPSULE (120 MG TOTAL) BY MOUTH 2 (TWO) TIMES DAILY.     Cardiovascular:  Calcium Channel Blockers Failed - 08/24/2019 12:30 AM      Failed - Last BP in normal range    BP Readings from Last 1 Encounters:  04/09/19 (!) 145/77         Failed - Valid encounter within last 6 months    Recent Outpatient Visits          4 months ago Encounter for annual physical exam   [facility], Dionne Bucy, MD   7 months ago Essential hypertension   [place], Dionne Bucy, MD   10 months ago Neurogenic pain   [facility] [place], Dionne Bucy, MD      Future Appointments            In 1 month Bacigalupo, Dionne Bucy, MD [facility] [place]

## 2019-09-10 ENCOUNTER — Other Ambulatory Visit: Payer: Self-pay

## 2019-09-10 ENCOUNTER — Ambulatory Visit: Payer: Medicare Other | Admitting: Dermatology

## 2019-09-10 DIAGNOSIS — Z85828 Personal history of other malignant neoplasm of skin: Secondary | ICD-10-CM

## 2019-09-10 DIAGNOSIS — C44629 Squamous cell carcinoma of skin of left upper limb, including shoulder: Secondary | ICD-10-CM

## 2019-09-10 DIAGNOSIS — L820 Inflamed seborrheic keratosis: Secondary | ICD-10-CM | POA: Diagnosis not present

## 2019-09-10 DIAGNOSIS — L578 Other skin changes due to chronic exposure to nonionizing radiation: Secondary | ICD-10-CM

## 2019-09-10 DIAGNOSIS — L814 Other melanin hyperpigmentation: Secondary | ICD-10-CM

## 2019-09-10 DIAGNOSIS — L570 Actinic keratosis: Secondary | ICD-10-CM

## 2019-09-10 DIAGNOSIS — L821 Other seborrheic keratosis: Secondary | ICD-10-CM

## 2019-09-10 DIAGNOSIS — C4492 Squamous cell carcinoma of skin, unspecified: Secondary | ICD-10-CM

## 2019-09-10 DIAGNOSIS — C44622 Squamous cell carcinoma of skin of right upper limb, including shoulder: Secondary | ICD-10-CM

## 2019-09-10 DIAGNOSIS — D485 Neoplasm of uncertain behavior of skin: Secondary | ICD-10-CM

## 2019-09-10 HISTORY — DX: Squamous cell carcinoma of skin, unspecified: C44.92

## 2019-09-10 HISTORY — DX: Personal history of other malignant neoplasm of skin: Z85.828

## 2019-09-10 NOTE — Patient Instructions (Signed)
Shave Excision Benign Lesion Wound Care Instructions  . Leave the original bandage on for 24 hours if possible.  If the bandage becomes soaked or soiled before that time, it is OK to remove it and examine the wound.  A small amount of post-operative bleeding is normal.  If excessive bleeding occurs, remove the bandage, place gauze over the site and apply continuous pressure (no peeking) over the area for 20-30 minutes.  If this does not stop the bleeding, try again for 40 minutes.  If this does not work, please call our clinic as soon as possible (even if after-hours).    . Twice a day, cleanse the wound with soap and water.  If a thick crust develops you may use a Q-tip dipped into dilute hydrogen peroxide (mix 1:1 with water) to dissolve it.  Hydrogen peroxide can slow the healing process, so use it only as needed.  After washing, apply Vaseline jelly or Polysporin ointment.  For best healing, the wound should be covered with a layer of ointment at all times.  This may mean re-applying the ointment several times a day.  For open wounds, continue until it has healed.    . If you have any swelling, keep the area elevated.  . Some redness, tenderness and white or yellow material in the wound is normal healing.  If the area becomes very sore and red, or develops a thick yellow-green material (pus), it may be infected; please notify us.    . Wound healing continues for up to one year following surgery.  It is not unusual to experience pain in the scar from time to time during the interval.  If the pain becomes severe or the scar thickens, you should notify the office.  A slight amount of redness in a scar is expected for the first six months.  After six months, the redness subsides and the scar will soften and fade.  The color difference becomes less noticeable with time.  If there are any problems, return for a post-op surgery check at your earliest convenience.  . Please call our office for any questions  or concerns.   Electrodesiccation and Curettage ("Scrape and Burn") Wound Care Instructions  1. Leave the original bandage on for 24 hours if possible.  If the bandage becomes soaked or soiled before that time, it is OK to remove it and examine the wound.  A small amount of post-operative bleeding is normal.  If excessive bleeding occurs, remove the bandage, place gauze over the site and apply continuous pressure (no peeking) over the area for 30 minutes. If this does not work, please call our clinic as soon as possible or page your doctor if it is after hours.   2. Once a day, cleanse the wound with soap and water. It is fine to shower. If a thick crust develops you may use a Q-tip dipped into dilute hydrogen peroxide (mix 1:1 with water) to dissolve it.  Hydrogen peroxide can slow the healing process, so use it only as needed.    3. After washing, apply petroleum jelly (Vaseline) or an antibiotic ointment if your doctor prescribed one for you, followed by a bandage.    4. For best healing, the wound should be covered with a layer of ointment at all times. If you are not able to keep the area covered with a bandage to hold the ointment in place, this may mean re-applying the ointment several times a day.  Continue this wound care   until the wound has healed and is no longer open. It may take several weeks for the wound to heal and close.  Itching and mild discomfort is normal during the healing process.  If you have any discomfort, you can take Tylenol (acetaminophen) or ibuprofen as directed on the bottle. (Please do not take these if you have an allergy to them or cannot take them for another reason).  Some redness, tenderness and white or yellow material in the wound is normal healing.  If the area becomes very sore and red, or develops a thick yellow-green material (pus), it may be infected; please notify us.    Wound healing continues for up to one year following surgery. It is not unusual to  experience pain in the scar from time to time during the interval.  If the pain becomes severe or the scar thickens, you should notify the office.    A slight amount of redness in a scar is expected for the first six months.  After six months, the redness will fade and the scar will soften and fade.  The color difference becomes less noticeable with time.  If there are any problems, return for a post-op surgery check at your earliest convenience.  To improve the appearance of the scar, you can use silicone scar gel, cream, or sheets (such as Mederma or Serica) every night for up to one year. These are available over the counter (without a prescription).  Please call our office at [phone] for any questions or concerns.  

## 2019-09-10 NOTE — Progress Notes (Signed)
Follow-Up Visit   Subjective  Dawn Bruce is a 74 y.o. female who presents for the following: Actinic Keratosis (recheck AK's on the face S/P LN2. Patient doesn't want to have PDT. ), lesion (of the right hand that has been there for about 2 months. Patient does pick at it and it is tender at times), and lesion (of the left wrist - sore and tender to the touch).  The following portions of the chart were reviewed this encounter and updated as appropriate: Tobacco  Allergies  Meds  Problems  Med Hx  Surg Hx  Fam Hx     Review of Systems: No other skin or systemic complaints.  Objective  Well appearing patient in no apparent distress; mood and affect are within normal limits.  A focused examination was performed including the face and hands. Relevant physical exam findings are noted in the Assessment and Plan.  Objective  Face, arms, and hands x 16 (16): Erythematous thin papules/macules with gritty scale.   Objective  L cheek x 2, R forehead x 1 (3): Erythematous keratotic or waxy stuck-on papule or plaque.   Objective  R dorsum hand: 1.1 cm Hyperkeratotic papule  Objective  L dorsum wrist: 2.6 x 2.0 cm Hyperkeratotic papule   Assessment & Plan  AK (actinic keratosis) (16) Face, arms, and hands x 16  Recheck the R of midline mid forehead at follow up appointment   Destruction of lesion - Face, arms, and hands x 16 Complexity: simple   Destruction method: cryotherapy   Informed consent: discussed and consent obtained   Timeout:  patient name, date of birth, surgical site, and procedure verified Lesion destroyed using liquid nitrogen: Yes   Region frozen until ice ball extended beyond lesion: Yes   Outcome: patient tolerated procedure well with no complications   Post-procedure details: wound care instructions given    Inflamed seborrheic keratosis (3) L cheek x 2, R forehead x 1  Destruction of lesion - L cheek x 2, R forehead x 1 Complexity: simple     Destruction method: cryotherapy   Informed consent: discussed and consent obtained   Timeout:  patient name, date of birth, surgical site, and procedure verified Lesion destroyed using liquid nitrogen: Yes   Region frozen until ice ball extended beyond lesion: Yes   Outcome: patient tolerated procedure well with no complications   Post-procedure details: wound care instructions given    Neoplasm of uncertain behavior of skin (2) R dorsum hand  Epidermal / dermal shaving  Lesion length (cm):  1.1 Lesion width (cm):  1.1 Margin per side (cm):  0.2 Total excision diameter (cm):  1.5 Informed consent: discussed and consent obtained   Timeout: patient name, date of birth, surgical site, and procedure verified   Procedure prep:  Patient was prepped and draped in usual sterile fashion Prep type:  Isopropyl alcohol Anesthesia: the lesion was anesthetized in a standard fashion   Anesthetic:  1% lidocaine w/ epinephrine 1-100,000 buffered w/ 8.4% NaHCO3 Instrument used: flexible razor blade   Hemostasis achieved with: pressure, aluminum chloride and electrodesiccation   Outcome: patient tolerated procedure well   Post-procedure details: sterile dressing applied and wound care instructions given   Dressing type: bandage and petrolatum    Destruction of lesion Complexity: extensive   Destruction method: electrodesiccation and curettage   Informed consent: discussed and consent obtained   Timeout:  patient name, date of birth, surgical site, and procedure verified Procedure prep:  Patient was prepped and draped  in usual sterile fashion Prep type:  Isopropyl alcohol Anesthesia: the lesion was anesthetized in a standard fashion   Anesthetic:  1% lidocaine w/ epinephrine 1-100,000 buffered w/ 8.4% NaHCO3 Curettage performed in three different directions: Yes   Electrodesiccation performed over the curetted area: Yes   Lesion length (cm):  1.1 Lesion width (cm):  1.1 Margin per side (cm):   0.2 Final wound size (cm):  1.5 Hemostasis achieved with:  pressure, aluminum chloride and electrodesiccation Outcome: patient tolerated procedure well with no complications   Post-procedure details: sterile dressing applied and wound care instructions given   Dressing type: bandage and petrolatum    Specimen 1 - Surgical pathology Differential Diagnosis: D48.5 r/o SCC; ED&C today  Check Margins: No 1.1 cm Hyperkeratotic papule  L dorsum wrist  Skin / nail biopsy Type of biopsy: tangential   Informed consent: discussed and consent obtained   Timeout: patient name, date of birth, surgical site, and procedure verified   Procedure prep:  Patient was prepped and draped in usual sterile fashion Prep type:  Isopropyl alcohol Anesthesia: the lesion was anesthetized in a standard fashion   Anesthetic:  1% lidocaine w/ epinephrine 1-100,000 buffered w/ 8.4% NaHCO3 Instrument used: flexible razor blade   Hemostasis achieved with: pressure, aluminum chloride and electrodesiccation   Outcome: patient tolerated procedure well   Post-procedure details: sterile dressing applied and wound care instructions given   Dressing type: bandage and petrolatum    Specimen 2 - Surgical pathology Differential Diagnosis: D48.5 r/o SCC Check Margins: No 2.6 x 2.0 cm Hyperkeratotic papule   Actinic Damage - diffuse scaly erythematous macules with underlying dyspigmentation - Recommend daily broad spectrum sunscreen SPF 30+ to sun-exposed areas, reapply every 2 hours as needed.  - Call for new or changing lesions.  Seborrheic Keratoses - Stuck-on, waxy, tan-brown papules and plaques  - Discussed benign etiology and prognosis. - Observe - Call for any changes  Lentigines - Scattered tan macules - Discussed due to sun exposure - Benign, observe - Call for any changes   Return in about 8 weeks (around 11/05/2019).   Luther Redo, CMA, am acting as scribe for Sarina Ser, MD  .  Documentation: I have reviewed the above documentation for accuracy and completeness, and I agree with the above.  Sarina Ser, MD

## 2019-09-11 ENCOUNTER — Encounter: Payer: Self-pay | Admitting: Dermatology

## 2019-09-12 ENCOUNTER — Encounter: Payer: Self-pay | Admitting: Family Medicine

## 2019-09-12 ENCOUNTER — Telehealth: Payer: Self-pay

## 2019-09-12 ENCOUNTER — Encounter: Payer: Self-pay | Admitting: Dermatology

## 2019-09-12 NOTE — Telephone Encounter (Signed)
OK to put referrals in for GI of their choice for colonoscopy.  If Richardson Landry is 74, let me look before sending referral Thanks!

## 2019-09-12 NOTE — Telephone Encounter (Signed)
Advised patient of results.  

## 2019-09-12 NOTE — Telephone Encounter (Signed)
Advised patient of results and scheduled appointment for [facility] of left wrist.

## 2019-09-12 NOTE — Telephone Encounter (Signed)
-----   Message from Ralene Bathe, MD sent at 09/11/2019  7:39 PM EDT ----- 1. Skin , right dorsum hand WELL DIFFERENTIATED SQUAMOUS CELL CARCINOMA 2. Skin , left dorsum wrist WELL DIFFERENTIATED SQUAMOUS CELL CARCINOMA WITH SUPERFICIAL INFILTRATION  1- Cancer - SCC Already treated Recheck on next visit 2- Cancer - SCC Schedule for treatment (EDC)

## 2019-10-08 ENCOUNTER — Other Ambulatory Visit: Payer: Self-pay

## 2019-10-08 ENCOUNTER — Ambulatory Visit (INDEPENDENT_AMBULATORY_CARE_PROVIDER_SITE_OTHER): Payer: Medicare Other | Admitting: Family Medicine

## 2019-10-08 ENCOUNTER — Encounter: Payer: Self-pay | Admitting: Family Medicine

## 2019-10-08 VITALS — BP 132/78 | HR 67 | Temp 96.8°F | Wt 135.0 lb

## 2019-10-08 DIAGNOSIS — I10 Essential (primary) hypertension: Secondary | ICD-10-CM | POA: Diagnosis not present

## 2019-10-08 DIAGNOSIS — E782 Mixed hyperlipidemia: Secondary | ICD-10-CM | POA: Diagnosis not present

## 2019-10-08 DIAGNOSIS — G894 Chronic pain syndrome: Secondary | ICD-10-CM | POA: Diagnosis not present

## 2019-10-08 DIAGNOSIS — R0683 Snoring: Secondary | ICD-10-CM

## 2019-10-08 DIAGNOSIS — R7309 Other abnormal glucose: Secondary | ICD-10-CM | POA: Diagnosis not present

## 2019-10-08 DIAGNOSIS — R7303 Prediabetes: Secondary | ICD-10-CM | POA: Insufficient documentation

## 2019-10-08 DIAGNOSIS — Z1159 Encounter for screening for other viral diseases: Secondary | ICD-10-CM

## 2019-10-08 DIAGNOSIS — M79632 Pain in left forearm: Secondary | ICD-10-CM

## 2019-10-08 DIAGNOSIS — N1831 Chronic kidney disease, stage 3a: Secondary | ICD-10-CM

## 2019-10-08 DIAGNOSIS — K219 Gastro-esophageal reflux disease without esophagitis: Secondary | ICD-10-CM

## 2019-10-08 MED ORDER — ESCITALOPRAM OXALATE 10 MG PO TABS: 10.0000 mg | ORAL_TABLET | Freq: Every day | ORAL | 1 refills | Status: DC

## 2019-10-08 MED ORDER — FENTANYL 50 MCG/HR TD PT72: 1.0000 | MEDICATED_PATCH | TRANSDERMAL | 0 refills | Status: DC

## 2019-10-08 MED ORDER — FAMOTIDINE 40 MG PO TABS: 40.0000 mg | ORAL_TABLET | Freq: Every day | ORAL | 3 refills | Status: DC

## 2019-10-08 MED ORDER — DILTIAZEM HCL ER COATED BEADS 120 MG PO CP24: 120.0000 mg | ORAL_CAPSULE | Freq: Two times a day (BID) | ORAL | 1 refills | Status: DC

## 2019-10-08 MED ORDER — PREGABALIN 150 MG PO CAPS: 150.0000 mg | ORAL_CAPSULE | Freq: Two times a day (BID) | ORAL | 0 refills | Status: DC

## 2019-10-08 MED ORDER — ATORVASTATIN CALCIUM 40 MG PO TABS: 40.0000 mg | ORAL_TABLET | Freq: Every day | ORAL | 1 refills | Status: DC

## 2019-10-08 NOTE — Assessment & Plan Note (Signed)
Chronic and well controlled Continue H2 blocker

## 2019-10-08 NOTE — Assessment & Plan Note (Signed)
Related to previous injury and nerve injury Continue fentanyl and lyrica Has pain contract

## 2019-10-08 NOTE — Assessment & Plan Note (Signed)
Well controlled Continue current medications Recheck metabolic panel F/u in 6 months  

## 2019-10-08 NOTE — Assessment & Plan Note (Signed)
Pt denies sleep apnea symptoms, such as morning headaches, fatigue, and stop breathing while sleeping. Advised pt to try Flonase daily and/or breath right strips for her nose.  Pt will call back if she starts experience sleep apnea symptoms.

## 2019-10-08 NOTE — Assessment & Plan Note (Signed)
Glucose slightly elevated at last check.  Will check A1C today.

## 2019-10-08 NOTE — Assessment & Plan Note (Signed)
Previously well controlled Continue statin Repeat FLP and CMP Goal LDL < 70 

## 2019-10-08 NOTE — Assessment & Plan Note (Signed)
Chronic and stable Followed by nephrology Avoid nephrotoxic meds

## 2019-10-08 NOTE — Assessment & Plan Note (Signed)
Chronic and well controlled, continue Lyrica and fentanyl patch.

## 2019-10-08 NOTE — Progress Notes (Signed)
I,Laura E Walsh,acting as a Education administrator for Lavon Paganini, MD.,have documented all relevant documentation on the behalf of Lavon Paganini, MD,as directed by  Lavon Paganini, MD while in the presence of Lavon Paganini, MD.  Established patient visit   Patient: Dawn Bruce   DOB: [birth year]   74 y.o. Female  MRN: [id number] Visit Date: 10/08/2019  Today's healthcare provider: Lavon Paganini, MD   Chief Complaint  Patient presents with  . Hypertension  . Hyperlipidemia   Subjective    HPI   Hypertension, follow-up  BP Readings from Last 3 Encounters:  10/08/19 132/78  04/09/19 (!) 145/77  01/03/19 (!) 144/71   Wt Readings from Last 3 Encounters:  10/08/19 135 lb (61.2 kg)  04/09/19 134 lb (60.8 kg)  01/02/19 132 lb 6.4 oz (60.1 kg)     She was last seen for hypertension 6 months ago.  BP at that visit was 145/77. Management since that visit includes no changes.  She reports excellent compliance with treatment. She is not having side effects.  She is following a Low Sodium diet. She is exercising. She does not smoke.  Use of agents associated with hypertension: none.   Outside blood pressures are not being checked at home. Symptoms: No chest pain No chest pressure  No palpitations No syncope  No dyspnea No orthopnea  No paroxysmal nocturnal dyspnea No lower extremity edema   Pertinent labs: Lab Results  Component Value Date   CHOL 168 01/02/2019   HDL 77 01/02/2019   LDLCALC 78 01/02/2019   TRIG 66 01/02/2019   CHOLHDL 2.2 01/02/2019   Lab Results  Component Value Date   NA 141 01/24/2019   K 4.4 01/24/2019   CREATININE 1.19 (H) 01/24/2019   GFRNONAA 45 (L) 01/24/2019   GFRAA 52 (L) 01/24/2019   GLUCOSE 101 (H) 01/24/2019     The 10-year ASCVD risk score Mikey Bussing DC Jr., et al., 2013) is: 22%   --------------------------------------------------------------------------------------------------- Lipid/Cholesterol, Follow-up  Last lipid  panel Other pertinent labs  Lab Results  Component Value Date   CHOL 168 01/02/2019   HDL 77 01/02/2019   LDLCALC 78 01/02/2019   TRIG 66 01/02/2019   CHOLHDL 2.2 01/02/2019   Lab Results  Component Value Date   ALT 19 01/02/2019   AST 27 01/02/2019   PLT 179 01/02/2019   TSH 2.68 01/03/2018   TSH 2.68 01/03/2018     She was last seen for this 6 months ago.  Management since that visit includes no chages.  She reports excellent compliance with treatment. She is not having side effects.   Symptoms: No chest pain No chest pressure/discomfort  No dyspnea No lower extremity edema  No numbness or tingling of extremity No orthopnea  No palpitations No paroxysmal nocturnal dyspnea  No speech difficulty No syncope   Current diet: in general, a "healthy" diet   Current exercise: walking  The 10-year ASCVD risk score Mikey Bussing DC Brooke Bonito., et al., 2013) is: 22%  ---------------------------------------------------------------------------------------------------   Patient Active Problem List   Diagnosis Date Noted  . Elevated glucose 10/08/2019  . Snoring 10/08/2019  . Stenosis of right carotid artery 10/03/2018  . Hyperlipidemia 10/03/2018  . GERD (gastroesophageal reflux disease) 10/03/2018  . GAD (generalized anxiety disorder) 10/03/2018  . Essential hypertension 10/02/2018  . CKD (chronic kidney disease) stage 3, GFR 30-59 ml/min 06/17/2018  . Chronic forearm pain (Primary Area of Pain) (Left) 06/17/2018  . Neurogenic pain 06/17/2018  . Chronic pain syndrome 06/06/2018  .  Long term current use of opiate analgesic 06/06/2018       Medications: Outpatient Medications Prior to Visit  Medication Sig  . aspirin 81 MG chewable tablet Chew by mouth daily.  Marland Kitchen atorvastatin (LIPITOR) 40 MG tablet Take 1 tablet (40 mg total) by mouth daily.  . Cholecalciferol (VITAMIN D3) 50 MCG (2000 UT) capsule Take 2,000 Units by mouth daily.  Marland Kitchen diltiazem (CARDIZEM CD) 120 MG 24 hr capsule Take 1  capsule (120 mg total) by mouth in the morning and at bedtime.  Marland Kitchen escitalopram (LEXAPRO) 10 MG tablet Take 1 tablet (10 mg total) by mouth daily.  . famotidine (PEPCID) 40 MG tablet Take 1 tablet (40 mg total) by mouth daily.  . fentaNYL (DURAGESIC) 50 MCG/HR Place 1 patch onto the skin every 3 (three) days.  . pregabalin (LYRICA) 150 MG capsule Take 1 capsule (150 mg total) by mouth 2 (two) times daily.   No facility-administered medications prior to visit.    Review of Systems  Constitutional: Negative.   Respiratory: Negative.   Cardiovascular: Negative.   Gastrointestinal: Negative.   Neurological: Negative for dizziness, light-headedness and headaches.  Psychiatric/Behavioral: Negative for behavioral problems, decreased concentration, dysphoric mood, hallucinations, self-injury, sleep disturbance and suicidal ideas. The patient is not nervous/anxious.     Last CBC Lab Results  Component Value Date   WBC 6.8 01/02/2019   HGB 14.7 01/02/2019   HCT 43.9 01/02/2019   MCV 89 01/02/2019   MCH 29.7 01/02/2019   RDW 13.9 01/02/2019   PLT 179 [id number]   Last metabolic panel Lab Results  Component Value Date   GLUCOSE 101 (H) 01/24/2019   NA 141 01/24/2019   K 4.4 01/24/2019   CL 102 01/24/2019   CO2 25 01/24/2019   BUN 20 01/24/2019   CREATININE 1.19 (H) 01/24/2019   GFRNONAA 45 (L) 01/24/2019   GFRAA 52 (L) 01/24/2019   CALCIUM 9.1 01/24/2019   PHOS 3.9 01/24/2019   PROT 6.2 01/02/2019   ALBUMIN 4.1 01/24/2019   LABGLOB 2.0 01/02/2019   AGRATIO 2.1 01/02/2019   BILITOT 0.5 01/02/2019   ALKPHOS 76 01/02/2019   AST 27 01/02/2019   ALT 19 01/02/2019   Last lipids Lab Results  Component Value Date   CHOL 168 01/02/2019   HDL 77 01/02/2019   LDLCALC 78 01/02/2019   TRIG 66 01/02/2019   CHOLHDL 2.2 01/02/2019   Last hemoglobin A1c No results found for: HGBA1C Last thyroid functions Lab Results  Component Value Date   TSH 2.68 01/03/2018   TSH 2.68  01/03/2018   Last vitamin D Lab Results  Component Value Date   25OHVITD2 3.3 06/06/2018   25OHVITD3 42 06/06/2018   VD25OH 40.9 01/03/2018   VD25OH 40.9 01/03/2018   Last vitamin B12 and Folate Lab Results  Component Value Date   VITAMINB12 444 06/06/2018    Objective    BP 132/78 (BP Location: Right Arm, Patient Position: Sitting, Cuff Size: Large)   Pulse 67   Temp (!) 96.8 F (36 C) (Temporal)   Wt 135 lb (61.2 kg)   SpO2 95%   BMI 23.91 kg/m  BP Readings from Last 3 Encounters:  10/08/19 132/78  04/09/19 (!) 145/77  01/03/19 (!) 144/71    Physical Exam Constitutional:      General: She is not in acute distress.    Appearance: Normal appearance. She is not diaphoretic.  HENT:     Head: Normocephalic and atraumatic.  Eyes:     Conjunctiva/sclera:  Conjunctivae normal.  Cardiovascular:     Rate and Rhythm: Normal rate and regular rhythm.     Pulses: Normal pulses.     Heart sounds: Normal heart sounds. No murmur.  Pulmonary:     Effort: Pulmonary effort is normal. No respiratory distress.     Breath sounds: Normal breath sounds. No wheezing.  Abdominal:     Palpations: Abdomen is soft.     Tenderness: There is no abdominal tenderness.  Musculoskeletal:     Cervical back: Neck supple.     Right lower leg: No edema.     Left lower leg: No edema.  Lymphadenopathy:     Cervical: No cervical adenopathy.  Skin:    General: Skin is warm and dry.  Neurological:     Mental Status: She is alert and oriented to person, place, and time. Mental status is at baseline.     Gait: Gait normal.  Psychiatric:        Mood and Affect: Mood normal.        Behavior: Behavior normal.        Thought Content: Thought content normal.        Judgment: Judgment normal.       No results found for any visits on 10/08/19.  Assessment & Plan     Problem List Items Addressed This Visit      Cardiovascular and Mediastinum   Essential hypertension - Primary    Well  controlled Continue current medications Recheck metabolic panel F/u in 6 months       Relevant Orders   Comprehensive metabolic panel     Digestive   GERD (gastroesophageal reflux disease)    Chronic and well controlled Continue H2 blocker        Genitourinary   CKD (chronic kidney disease) stage 3, GFR 30-59 ml/min    Chronic and stable Followed by nephrology Avoid nephrotoxic meds        Other   Chronic pain syndrome (Chronic)    Chronic and well controlled, continue Lyrica and fentanyl patch.      Chronic forearm pain (Primary Area of Pain) (Left) (Chronic)    Related to previous injury and nerve injury Continue fentanyl and lyrica Has pain contract      Hyperlipidemia    Previously well controlled Continue statin Repeat FLP and CMP Goal LDL < 70       Relevant Orders   Lipid panel   Comprehensive metabolic panel   Elevated glucose    Glucose slightly elevated at last check.  Will check A1C today.       Relevant Orders   Hemoglobin A1c   Snoring    Pt denies sleep apnea symptoms, such as morning headaches, fatigue, and stop breathing while sleeping. Advised pt to try Flonase daily and/or breath right strips for her nose.  Pt will call back if she starts experience sleep apnea symptoms.         Other Visit Diagnoses    Need for hepatitis C screening test       Relevant Orders   Hepatitis C Antibody       Return in about 6 months (around 04/09/2020) for AWV and physical.      I, Lavon Paganini, MD, have reviewed all documentation for this visit. The documentation on 10/08/19 for the exam, diagnosis, procedures, and orders are all accurate and complete.   Dawn Bruce, Dionne Bucy, MD, MPH [facility]

## 2019-10-08 NOTE — Patient Instructions (Signed)

## 2019-10-09 ENCOUNTER — Telehealth: Payer: Self-pay

## 2019-10-09 LAB — COMPREHENSIVE METABOLIC PANEL
ALT: 19 IU/L (ref 0–32)
AST: 27 IU/L (ref 0–40)
Albumin/Globulin Ratio: 2.2 (ref 1.2–2.2)
Albumin: 4.4 g/dL (ref 3.7–4.7)
Alkaline Phosphatase: 89 IU/L (ref 48–121)
BUN/Creatinine Ratio: 23 (ref 12–28)
BUN: 27 mg/dL (ref 8–27)
Bilirubin Total: 0.5 mg/dL (ref 0.0–1.2)
CO2: 27 mmol/L (ref 20–29)
Calcium: 9.8 mg/dL (ref 8.7–10.3)
Chloride: 105 mmol/L (ref 96–106)
Creatinine, Ser: 1.15 mg/dL — ABNORMAL HIGH (ref 0.57–1.00)
GFR calc Af Amer: 54 mL/min/{1.73_m2} — ABNORMAL LOW (ref >59)
GFR calc non Af Amer: 47 mL/min/{1.73_m2} — ABNORMAL LOW (ref >59)
Globulin, Total: 2 g/dL (ref 1.5–4.5)
Glucose: 92 mg/dL (ref 65–99)
Potassium: 4.6 mmol/L (ref 3.5–5.2)
Sodium: 146 mmol/L — ABNORMAL HIGH (ref 134–144)
Total Protein: 6.4 g/dL (ref 6.0–8.5)

## 2019-10-09 LAB — LIPID PANEL
Chol/HDL Ratio: 2.9 ratio (ref 0.0–4.4)
Cholesterol, Total: 195 mg/dL (ref 100–199)
HDL: 67 mg/dL (ref >39)
LDL Chol Calc (NIH): 108 mg/dL — ABNORMAL HIGH (ref 0–99)
Triglycerides: 112 mg/dL (ref 0–149)
VLDL Cholesterol Cal: 20 mg/dL (ref 5–40)

## 2019-10-09 LAB — HEPATITIS C ANTIBODY: Hep C Virus Ab: 0.1 s/co ratio (ref 0.0–0.9)

## 2019-10-09 LAB — HEMOGLOBIN A1C
Est. average glucose Bld gHb Est-mCnc: 126 mg/dL
Hgb A1c MFr Bld: 6 % — ABNORMAL HIGH (ref 4.8–5.6)

## 2019-10-09 NOTE — Telephone Encounter (Signed)
-----   Message from Virginia Crews, MD sent at 10/09/2019  8:21 AM EDT ----- Normal labs, except Hemoglobin A1c, 3 month avg of blood sugars, is in prediabetic range.  In order to prevent progression to diabetes, recommend low carb diet and regular exercise

## 2019-10-09 NOTE — Telephone Encounter (Signed)
Patient advised as below. Patient verbalizes understanding and is in agreement with treatment plan.  

## 2019-10-28 ENCOUNTER — Other Ambulatory Visit: Payer: Self-pay

## 2019-10-28 ENCOUNTER — Encounter: Payer: Self-pay | Admitting: Dermatology

## 2019-10-28 ENCOUNTER — Ambulatory Visit: Payer: Medicare Other | Admitting: Dermatology

## 2019-10-28 DIAGNOSIS — Z86007 Personal history of in-situ neoplasm of skin: Secondary | ICD-10-CM

## 2019-10-28 DIAGNOSIS — L820 Inflamed seborrheic keratosis: Secondary | ICD-10-CM

## 2019-10-28 DIAGNOSIS — C44629 Squamous cell carcinoma of skin of left upper limb, including shoulder: Secondary | ICD-10-CM | POA: Diagnosis not present

## 2019-10-28 DIAGNOSIS — D492 Neoplasm of unspecified behavior of bone, soft tissue, and skin: Secondary | ICD-10-CM

## 2019-10-28 DIAGNOSIS — L578 Other skin changes due to chronic exposure to nonionizing radiation: Secondary | ICD-10-CM | POA: Diagnosis not present

## 2019-10-28 DIAGNOSIS — C44622 Squamous cell carcinoma of skin of right upper limb, including shoulder: Secondary | ICD-10-CM | POA: Diagnosis not present

## 2019-10-28 DIAGNOSIS — Z85828 Personal history of other malignant neoplasm of skin: Secondary | ICD-10-CM | POA: Diagnosis not present

## 2019-10-28 DIAGNOSIS — D0461 Carcinoma in situ of skin of right upper limb, including shoulder: Secondary | ICD-10-CM

## 2019-10-28 DIAGNOSIS — C4492 Squamous cell carcinoma of skin, unspecified: Secondary | ICD-10-CM

## 2019-10-28 HISTORY — DX: Personal history of in-situ neoplasm of skin: Z86.007

## 2019-10-28 NOTE — Patient Instructions (Signed)

## 2019-10-28 NOTE — Progress Notes (Signed)
Follow-Up Visit   Subjective  Dawn Bruce is a 74 y.o. female who presents for the following: SCC bx proven (L dorsum wrist, bx 09/10/19). She had a SCC treated on the right dorsum hand last visits is healed well. She has a few spots on the right dorsum hand treated with liquid nitrogen destruction last visit that have persisted and she would like these checked. She also has a spot on her forearm that is irritating and she would like this treated.  The following portions of the chart were reviewed this encounter and updated as appropriate:  Tobacco  Allergies  Meds  Problems  Med Hx  Surg Hx  Fam Hx      Review of Systems:  No other skin or systemic complaints except as noted in HPI or Assessment and Plan.  Objective  Well appearing patient in no apparent distress; mood and affect are within normal limits.  A focused examination was performed including bil hands. Relevant physical exam findings are noted in the Assessment and Plan.  Objective  Left dorsum wrist: Pink bx site  Objective  Right dorsum hand distal lateral: 0.6cm hyperkeratotic pap  Objective  Right dorsum hand distal medial: 0.6cm hyperkeratotic pap  Objective  R dorsum hand proximal lateral: 0.6cm hyperkeratotic pap  Objective  R dorsum hand proximal medial: 0.6cm hyperkeratotic pap  Objective  Right dorsum hand: Well healed scar with no evidence of recurrence, no lymphadenopathy.   Objective  Right Forearm - Posterior: Erythematous keratotic or waxy stuck-on papule or plaque.    Assessment & Plan    History of SCC (squamous cell carcinoma) of skin Right dorsum hand  Clear. Observe for recurrence. Call clinic for new or changing lesions.  Recommend regular skin exams, daily broad-spectrum spf 30+ sunscreen use, and photoprotection.     Inflamed seborrheic keratosis Right Forearm - Posterior  Destruction of lesion - Right Forearm - Posterior Complexity: simple   Destruction method:  cryotherapy   Informed consent: discussed and consent obtained   Timeout:  patient name, date of birth, surgical site, and procedure verified Lesion destroyed using liquid nitrogen: Yes   Region frozen until ice ball extended beyond lesion: Yes   Outcome: patient tolerated procedure well with no complications   Post-procedure details: wound care instructions given    Neoplasm of skin (4) Right dorsum hand distal lateral  Epidermal / dermal shaving  Lesion length (cm):  0.6 Lesion width (cm):  0.7 Margin per side (cm):  0.2 Total excision diameter (cm):  1.1 Informed consent: discussed and consent obtained   Timeout: patient name, date of birth, surgical site, and procedure verified   Procedure prep:  Patient was prepped and draped in usual sterile fashion Prep type:  Isopropyl alcohol Anesthesia: the lesion was anesthetized in a standard fashion   Anesthetic:  1% lidocaine w/ epinephrine 1-100,000 buffered w/ 8.4% NaHCO3 Instrument used: flexible razor blade   Hemostasis achieved with: pressure, aluminum chloride and electrodesiccation   Outcome: patient tolerated procedure well   Post-procedure details: sterile dressing applied and wound care instructions given   Dressing type: bandage and petrolatum    Destruction of lesion Complexity: extensive   Destruction method: electrodesiccation and curettage   Informed consent: discussed and consent obtained   Timeout:  patient name, date of birth, surgical site, and procedure verified Procedure prep:  Patient was prepped and draped in usual sterile fashion Prep type:  Isopropyl alcohol Anesthesia: the lesion was anesthetized in a standard fashion   Anesthetic:  1% lidocaine w/ epinephrine 1-100,000 buffered w/ 8.4% NaHCO3 Curettage performed in three different directions: Yes   Electrodesiccation performed over the curetted area: Yes   Lesion length (cm):  0.6 Lesion width (cm):  0.7 Margin per side (cm):  0.2 Final wound size  (cm):  1.1 Hemostasis achieved with:  pressure, aluminum chloride and electrodesiccation Outcome: patient tolerated procedure well with no complications   Post-procedure details: sterile dressing applied and wound care instructions given   Dressing type: bandage and petrolatum    Specimen 3 - Surgical pathology Differential Diagnosis: D48.5 R/O SCC Check Margins: No 0.6cm hyperkeratotic pap  Right dorsum hand distal medial  Epidermal / dermal shaving  Lesion length (cm):  0.6 Lesion width (cm):  0.7 Margin per side (cm):  0.2 Total excision diameter (cm):  1.1 Informed consent: discussed and consent obtained   Timeout: patient name, date of birth, surgical site, and procedure verified   Procedure prep:  Patient was prepped and draped in usual sterile fashion Prep type:  Isopropyl alcohol Anesthesia: the lesion was anesthetized in a standard fashion   Anesthetic:  1% lidocaine w/ epinephrine 1-100,000 buffered w/ 8.4% NaHCO3 Instrument used: flexible razor blade   Hemostasis achieved with: pressure, aluminum chloride and electrodesiccation   Outcome: patient tolerated procedure well   Post-procedure details: sterile dressing applied and wound care instructions given   Dressing type: bandage and petrolatum    Destruction of lesion Complexity: extensive   Destruction method: electrodesiccation and curettage   Informed consent: discussed and consent obtained   Timeout:  patient name, date of birth, surgical site, and procedure verified Procedure prep:  Patient was prepped and draped in usual sterile fashion Prep type:  Isopropyl alcohol Anesthesia: the lesion was anesthetized in a standard fashion   Anesthetic:  1% lidocaine w/ epinephrine 1-100,000 buffered w/ 8.4% NaHCO3 Curettage performed in three different directions: Yes   Electrodesiccation performed over the curetted area: Yes   Lesion length (cm):  0.6 Lesion width (cm):  0.7 Margin per side (cm):  0.2 Final wound  size (cm):  1.1 Hemostasis achieved with:  pressure, aluminum chloride and electrodesiccation Outcome: patient tolerated procedure well with no complications   Post-procedure details: sterile dressing applied and wound care instructions given   Dressing type: bandage and petrolatum    Specimen 4 - Surgical pathology Differential Diagnosis: D48.5 R/O SCC Check Margins: No 0.6cm hyperkeratotic pap  R dorsum hand proximal lateral  Epidermal / dermal shaving  Lesion length (cm):  0.6 Lesion width (cm):  0.7 Margin per side (cm):  0.2 Total excision diameter (cm):  1.1 Informed consent: discussed and consent obtained   Timeout: patient name, date of birth, surgical site, and procedure verified   Procedure prep:  Patient was prepped and draped in usual sterile fashion Prep type:  Isopropyl alcohol Anesthesia: the lesion was anesthetized in a standard fashion   Anesthetic:  1% lidocaine w/ epinephrine 1-100,000 buffered w/ 8.4% NaHCO3 Instrument used: flexible razor blade   Hemostasis achieved with: pressure, aluminum chloride and electrodesiccation   Outcome: patient tolerated procedure well   Post-procedure details: sterile dressing applied and wound care instructions given   Dressing type: bandage and petrolatum    Destruction of lesion Complexity: extensive   Destruction method: electrodesiccation and curettage   Informed consent: discussed and consent obtained   Timeout:  patient name, date of birth, surgical site, and procedure verified Procedure prep:  Patient was prepped and draped in usual sterile fashion Prep type:  Isopropyl  alcohol Anesthesia: the lesion was anesthetized in a standard fashion   Anesthetic:  1% lidocaine w/ epinephrine 1-100,000 buffered w/ 8.4% NaHCO3 Curettage performed in three different directions: Yes   Electrodesiccation performed over the curetted area: Yes   Lesion length (cm):  0.6 Lesion width (cm):  0.7 Margin per side (cm):  0.2 Final  wound size (cm):  1.1 Hemostasis achieved with:  pressure, aluminum chloride and electrodesiccation Outcome: patient tolerated procedure well with no complications   Post-procedure details: sterile dressing applied and wound care instructions given   Dressing type: bandage and petrolatum    Specimen 1 - Surgical pathology Differential Diagnosis: D48.5 R/O SCC Check Margins: No 0.6cm hyperkeratotic pap  R dorsum hand proximal medial  Epidermal / dermal shaving  Lesion length (cm):  0.6 Lesion width (cm):  0.7 Margin per side (cm):  0.2 Total excision diameter (cm):  1.1 Informed consent: discussed and consent obtained   Timeout: patient name, date of birth, surgical site, and procedure verified   Procedure prep:  Patient was prepped and draped in usual sterile fashion Prep type:  Isopropyl alcohol Anesthesia: the lesion was anesthetized in a standard fashion   Anesthetic:  1% lidocaine w/ epinephrine 1-100,000 buffered w/ 8.4% NaHCO3 Instrument used: flexible razor blade   Hemostasis achieved with: pressure, aluminum chloride and electrodesiccation   Outcome: patient tolerated procedure well   Post-procedure details: sterile dressing applied and wound care instructions given   Dressing type: bandage and petrolatum    Destruction of lesion Complexity: extensive   Destruction method: electrodesiccation and curettage   Informed consent: discussed and consent obtained   Timeout:  patient name, date of birth, surgical site, and procedure verified Procedure prep:  Patient was prepped and draped in usual sterile fashion Prep type:  Isopropyl alcohol Anesthesia: the lesion was anesthetized in a standard fashion   Anesthetic:  1% lidocaine w/ epinephrine 1-100,000 buffered w/ 8.4% NaHCO3 Curettage performed in three different directions: Yes   Electrodesiccation performed over the curetted area: Yes   Lesion length (cm):  0.6 Lesion width (cm):  0.7 Margin per side (cm):  0.2 Final  wound size (cm):  1.1 Hemostasis achieved with:  pressure, aluminum chloride and electrodesiccation Outcome: patient tolerated procedure well with no complications   Post-procedure details: sterile dressing applied and wound care instructions given   Dressing type: bandage and petrolatum    Specimen 2 - Surgical pathology Differential Diagnosis: D48.5 R/O SCC Check Margins: No 0.6cm hyperkeratotic pap  Squamous cell carcinoma of skin, biopsy-proven Left dorsum wrist  Destruction of lesion Complexity: extensive   Destruction method: electrodesiccation and curettage   Informed consent: discussed and consent obtained   Timeout:  patient name, date of birth, surgical site, and procedure verified Procedure prep:  Patient was prepped and draped in usual sterile fashion Prep type:  Isopropyl alcohol Anesthesia: the lesion was anesthetized in a standard fashion   Anesthetic:  1% lidocaine w/ epinephrine 1-100,000 buffered w/ 8.4% NaHCO3 Curettage performed in three different directions: Yes   Electrodesiccation performed over the curetted area: Yes   Lesion length (cm):  0.8 Lesion width (cm):  0.8 Margin per side (cm):  0.2 Final wound size (cm):  1.2 Hemostasis achieved with:  pressure, aluminum chloride and electrodesiccation Outcome: patient tolerated procedure well with no complications   Post-procedure details: sterile dressing applied and wound care instructions given   Dressing type: bandage and petrolatum    Actinic Damage -severe - diffuse scaly erythematous macules with underlying dyspigmentation -  Recommend daily broad spectrum sunscreen SPF 30+ to sun-exposed areas, reapply every 2 hours as needed.  - Call for new or changing lesions.  Return in about 6 months (around 04/28/2020).   I, Othelia Pulling, RMA, am acting as scribe for Sarina Ser, MD .  Documentation: I have reviewed the above documentation for accuracy and completeness, and I agree with the above.  Sarina Ser, MD

## 2019-10-29 ENCOUNTER — Encounter: Payer: Self-pay | Admitting: Dermatology

## 2019-10-30 ENCOUNTER — Ambulatory Visit: Payer: Medicare Other | Admitting: Dermatology

## 2019-11-03 ENCOUNTER — Encounter: Payer: Self-pay | Admitting: Dermatology

## 2019-11-03 NOTE — Telephone Encounter (Signed)
Patient called and informed of biopsy results, patient verbalized understanding.  Patient also sent My chart message concerning the billing code because they stated Right upper limb, including shoulder, when her biopsy were on her right hand. Explained that it is how the procedure was coded and had nothing to do with here the biopsies and treatments were preformed.

## 2019-11-11 ENCOUNTER — Ambulatory Visit: Payer: Medicare Other | Admitting: Dermatology

## 2019-11-14 ENCOUNTER — Telehealth: Payer: Medicare Other | Admitting: Family Medicine

## 2020-01-11 ENCOUNTER — Encounter: Payer: Self-pay | Admitting: Family Medicine

## 2020-01-11 DIAGNOSIS — Z1211 Encounter for screening for malignant neoplasm of colon: Secondary | ICD-10-CM

## 2020-01-14 NOTE — Telephone Encounter (Signed)
No. Next labs with CPE.  Can put on flu clinic schedule.  Ok to refer to GI for colon cancer screening for both also.

## 2020-01-23 ENCOUNTER — Encounter: Payer: Self-pay | Admitting: Family Medicine

## 2020-01-23 ENCOUNTER — Telehealth: Payer: Self-pay

## 2020-01-23 DIAGNOSIS — Z1231 Encounter for screening mammogram for malignant neoplasm of breast: Secondary | ICD-10-CM

## 2020-01-27 ENCOUNTER — Encounter: Payer: Self-pay | Admitting: *Deleted

## 2020-01-28 NOTE — Telephone Encounter (Signed)
Yes please order mammogram and let her know to call and schedule. Thanks!

## 2020-01-29 ENCOUNTER — Ambulatory Visit: Payer: Medicare Other

## 2020-02-05 ENCOUNTER — Ambulatory Visit
Admission: RE | Admit: 2020-02-05 | Discharge: 2020-02-05 | Disposition: A | Discharge status: Home / Self Care | Payer: Medicare Other | Source: Ambulatory Visit | Attending: Family Medicine | Admitting: Family Medicine

## 2020-02-05 ENCOUNTER — Other Ambulatory Visit: Payer: Self-pay

## 2020-02-05 DIAGNOSIS — Z1231 Encounter for screening mammogram for malignant neoplasm of breast: Secondary | ICD-10-CM | POA: Diagnosis not present

## 2020-02-07 ENCOUNTER — Encounter: Payer: Self-pay | Admitting: Family Medicine

## 2020-02-09 ENCOUNTER — Encounter: Payer: Self-pay | Admitting: Dermatology

## 2020-02-09 MED ORDER — PREGABALIN 150 MG PO CAPS: 150.0000 mg | ORAL_CAPSULE | Freq: Two times a day (BID) | ORAL | 1 refills | Status: DC

## 2020-02-18 ENCOUNTER — Telehealth: Payer: Self-pay

## 2020-02-18 NOTE — Telephone Encounter (Signed)
-----   Message from Virginia Crews, MD sent at 02/18/2020 10:57 AM EDT ----- Normal mammogram. Repeat in 1 yr

## 2020-02-18 NOTE — Telephone Encounter (Signed)
Written by Virginia Crews, MD on 02/18/2020 10:57 AM EDT Seen by patient Dawn Bruce on 02/18/2020 11:27 AM

## 2020-02-19 ENCOUNTER — Encounter: Payer: Self-pay | Admitting: Family Medicine

## 2020-03-04 ENCOUNTER — Ambulatory Visit: Payer: Medicare Other | Admitting: Dermatology

## 2020-03-04 ENCOUNTER — Other Ambulatory Visit: Payer: Self-pay

## 2020-03-04 DIAGNOSIS — C44622 Squamous cell carcinoma of skin of right upper limb, including shoulder: Secondary | ICD-10-CM | POA: Diagnosis not present

## 2020-03-04 DIAGNOSIS — D485 Neoplasm of uncertain behavior of skin: Secondary | ICD-10-CM | POA: Diagnosis not present

## 2020-03-04 DIAGNOSIS — Z85828 Personal history of other malignant neoplasm of skin: Secondary | ICD-10-CM

## 2020-03-04 DIAGNOSIS — D0439 Carcinoma in situ of skin of other parts of face: Secondary | ICD-10-CM

## 2020-03-04 DIAGNOSIS — L578 Other skin changes due to chronic exposure to nonionizing radiation: Secondary | ICD-10-CM

## 2020-03-04 DIAGNOSIS — D0462 Carcinoma in situ of skin of left upper limb, including shoulder: Secondary | ICD-10-CM | POA: Diagnosis not present

## 2020-03-04 DIAGNOSIS — Z872 Personal history of diseases of the skin and subcutaneous tissue: Secondary | ICD-10-CM

## 2020-03-04 DIAGNOSIS — L570 Actinic keratosis: Secondary | ICD-10-CM

## 2020-03-04 NOTE — Progress Notes (Signed)
Follow-Up Visit   Subjective  Dawn Bruce is a 74 y.o. female who presents for the following: Other (Spots of right hand, left wrist and face that are scaly and irritating). She has history of severe sun damage and multiple skin cancers treated in the past.  The following portions of the chart were reviewed this encounter and updated as appropriate:  Tobacco  Allergies  Meds  Problems  Med Hx  Surg Hx  Fam Hx      Review of Systems:  No other skin or systemic complaints except as noted in HPI or Assessment and Plan.  Objective  Well appearing patient in no apparent distress; mood and affect are within normal limits.  A focused examination was performed including face, hands/arms. Relevant physical exam findings are noted in the Assessment and Plan.  Objective  Right Dorsal Hand: 1.6 cm Hyperkeratotic papule     Objective  Left dorsum wrist: 2.4 cm Hyperkeratotic papule     Objective  Right lat infraorbital: 0.6 cm Pink papule     Objective  Left sideburn: 0.8 cm crusted ulcer     Objective  Right nasl tip x 1, left lat brow x 1 (2): Erythematous thin papules/macules with gritty scale.    Assessment & Plan    Actinic Damage - diffuse scaly erythematous macules with underlying dyspigmentation - Recommend daily broad spectrum sunscreen SPF 30+ to sun-exposed areas, reapply every 2 hours as needed.  - Call for new or changing lesions.  History of Squamous Cell Carcinoma of the Skin - No evidence of recurrence today - No lymphadenopathy - Recommend regular full body skin exams - Recommend daily broad spectrum sunscreen SPF 30+ to sun-exposed areas, reapply every 2 hours as needed.  - Call if any new or changing lesions are noted between office visits   Neoplasm of uncertain behavior of skin (4) Right Dorsal Hand  Epidermal / dermal shaving  Lesion diameter (cm):  1.6 Informed consent: discussed and consent obtained   Timeout: patient name,  date of birth, surgical site, and procedure verified   Procedure prep:  Patient was prepped and draped in usual sterile fashion Prep type:  Isopropyl alcohol Anesthesia: the lesion was anesthetized in a standard fashion   Anesthetic:  1% lidocaine w/ epinephrine 1-100,000 buffered w/ 8.4% NaHCO3 Instrument used: flexible razor blade   Hemostasis achieved with: pressure, aluminum chloride and electrodesiccation   Outcome: patient tolerated procedure well   Post-procedure details: sterile dressing applied and wound care instructions given   Dressing type: bandage and petrolatum    Destruction of lesion Complexity: extensive   Destruction method: electrodesiccation and curettage   Informed consent: discussed and consent obtained   Timeout:  patient name, date of birth, surgical site, and procedure verified Procedure prep:  Patient was prepped and draped in usual sterile fashion Prep type:  Isopropyl alcohol Anesthesia: the lesion was anesthetized in a standard fashion   Anesthetic:  1% lidocaine w/ epinephrine 1-100,000 buffered w/ 8.4% NaHCO3 Curettage performed in three different directions: Yes   Electrodesiccation performed over the curetted area: Yes   Lesion length (cm):  1.6 Lesion width (cm):  1.6 Margin per side (cm):  0.2 Final wound size (cm):  2 Hemostasis achieved with:  pressure and aluminum chloride Outcome: patient tolerated procedure well with no complications   Post-procedure details: sterile dressing applied and wound care instructions given   Dressing type: bandage and petrolatum    Specimen 1 - Surgical pathology Differential Diagnosis: SCC vs  other  Check Margins: No 1.6 cm Hyperkeratotic papule EDC today  Left dorsum wrist  Epidermal / dermal shaving  Lesion diameter (cm):  2.4 Informed consent: discussed and consent obtained   Timeout: patient name, date of birth, surgical site, and procedure verified   Procedure prep:  Patient was prepped and draped  in usual sterile fashion Prep type:  Isopropyl alcohol Anesthesia: the lesion was anesthetized in a standard fashion   Anesthetic:  1% lidocaine w/ epinephrine 1-100,000 buffered w/ 8.4% NaHCO3 Instrument used: flexible razor blade   Hemostasis achieved with: pressure, aluminum chloride and electrodesiccation   Outcome: patient tolerated procedure well   Post-procedure details: sterile dressing applied and wound care instructions given   Dressing type: bandage and petrolatum    Destruction of lesion Complexity: extensive   Destruction method: electrodesiccation and curettage   Informed consent: discussed and consent obtained   Timeout:  patient name, date of birth, surgical site, and procedure verified Procedure prep:  Patient was prepped and draped in usual sterile fashion Prep type:  Isopropyl alcohol Anesthesia: the lesion was anesthetized in a standard fashion   Anesthetic:  1% lidocaine w/ epinephrine 1-100,000 buffered w/ 8.4% NaHCO3 Curettage performed in three different directions: Yes   Electrodesiccation performed over the curetted area: Yes   Lesion length (cm):  2.4 Lesion width (cm):  2.4 Margin per side (cm):  0.2 Final wound size (cm):  2.8 Hemostasis achieved with:  pressure and aluminum chloride Outcome: patient tolerated procedure well with no complications   Post-procedure details: sterile dressing applied and wound care instructions given   Dressing type: bandage and petrolatum    Specimen 2 - Surgical pathology Differential Diagnosis: CC vs other  Check Margins: No 2.4 cm Hyperkeratotic papule EDC today  Right lat infraorbital  Skin / nail biopsy Type of biopsy: tangential   Informed consent: discussed and consent obtained   Timeout: patient name, date of birth, surgical site, and procedure verified   Procedure prep:  Patient was prepped and draped in usual sterile fashion Prep type:  Isopropyl alcohol Anesthesia: the lesion was anesthetized in a  standard fashion   Anesthetic:  1% lidocaine w/ epinephrine 1-100,000 buffered w/ 8.4% NaHCO3 Instrument used: flexible razor blade   Hemostasis achieved with: pressure, aluminum chloride and electrodesiccation   Outcome: patient tolerated procedure well   Post-procedure details: sterile dressing applied and wound care instructions given   Dressing type: bandage and petrolatum    Specimen 3 - Surgical pathology Differential Diagnosis: BCC vs other  Check Margins: No 0.6 cm Pink papule  Left sideburn  Skin / nail biopsy Type of biopsy: tangential   Informed consent: discussed and consent obtained   Timeout: patient name, date of birth, surgical site, and procedure verified   Procedure prep:  Patient was prepped and draped in usual sterile fashion Prep type:  Isopropyl alcohol Anesthesia: the lesion was anesthetized in a standard fashion   Anesthetic:  1% lidocaine w/ epinephrine 1-100,000 buffered w/ 8.4% NaHCO3 Instrument used: flexible razor blade   Hemostasis achieved with: pressure, aluminum chloride and electrodesiccation   Outcome: patient tolerated procedure well   Post-procedure details: sterile dressing applied and wound care instructions given   Dressing type: bandage and petrolatum    Specimen 4 - Surgical pathology Differential Diagnosis: BCC vs SCC vs other Check Margins: No 0.8 cm crusted ulcer  AK (actinic keratosis) (2) Right nasl tip x 1, left lat brow x 1  Destruction of lesion - Right nasl tip  x 1, left lat brow x 1 Complexity: simple   Destruction method: cryotherapy   Informed consent: discussed and consent obtained   Timeout:  patient name, date of birth, surgical site, and procedure verified Lesion destroyed using liquid nitrogen: Yes   Region frozen until ice ball extended beyond lesion: Yes   Outcome: patient tolerated procedure well with no complications   Post-procedure details: wound care instructions given    SCC (squamous cell carcinoma),  hand, right  Squamous cell carcinoma in situ (SCCIS) of skin of left wrist  Return for Follow up as scheduled.  I, Ashok Cordia, CMA, am acting as scribe for Sarina Ser, MD .  Documentation: I have reviewed the above documentation for accuracy and completeness, and I agree with the above.  Sarina Ser, MD

## 2020-03-04 NOTE — Patient Instructions (Signed)

## 2020-03-10 ENCOUNTER — Telehealth: Payer: Self-pay

## 2020-03-10 ENCOUNTER — Encounter: Payer: Self-pay | Admitting: Dermatology

## 2020-03-10 NOTE — Telephone Encounter (Signed)
-----   Message from Ralene Bathe, MD sent at 03/09/2020  6:53 PM EDT ----- 1. Skin , right dorsal hand WELL DIFFERENTIATED SQUAMOUS CELL CARCINOMA 2. Skin , left dorsum wrist SQUAMOUS CELL CARCINOMA IN SITU 3. Skin , right lat infraorbital SQUAMOUS CELL CARCINOMA IN SITU ARISING IN A SEBORRHEIC KERATOSIS 4. Skin , left sideburn HYPERTROPHIC ACTINIC KERATOSIS  1,2 - both cancer - SCC Already treated 3- cancer - SCC in situ - superficial Schedule for treatment (EDC) 4-  PreCancer -  schedule for treatment (Ln2)

## 2020-03-10 NOTE — Telephone Encounter (Signed)
Advised patient of results and she is scheduled for [facility] and LN2/hd

## 2020-04-13 DIAGNOSIS — I129 Hypertensive chronic kidney disease with stage 1 through stage 4 chronic kidney disease, or unspecified chronic kidney disease: Secondary | ICD-10-CM | POA: Diagnosis not present

## 2020-04-13 DIAGNOSIS — E559 Vitamin D deficiency, unspecified: Secondary | ICD-10-CM | POA: Diagnosis not present

## 2020-04-13 DIAGNOSIS — N183 Chronic kidney disease, stage 3 unspecified: Secondary | ICD-10-CM | POA: Diagnosis not present

## 2020-04-13 DIAGNOSIS — G894 Chronic pain syndrome: Secondary | ICD-10-CM | POA: Diagnosis not present

## 2020-04-19 NOTE — Progress Notes (Signed)
Subjective:   Dawn Bruce is a 74 y.o. female who presents for Medicare Annual (Subsequent) preventive examination.  I connected with [name] today by telephone and verified that I am speaking with the correct person using two identifiers. Location patient: home Location provider: work Persons participating in the virtual visit: patient, provider.   I discussed the limitations, risks, security and privacy concerns of performing an evaluation and management service by telephone and the availability of in person appointments. I also discussed with the patient that there may be a patient responsible charge related to this service. The patient expressed understanding and verbally consented to this telephonic visit.    Interactive audio and video telecommunications were attempted between this provider and patient, however failed, due to patient having technical difficulties OR patient did not have access to video capability.  We continued and completed visit with audio only.   Review of Systems    N/A  Cardiac Risk Factors include: advanced age (>49men, >50 women);dyslipidemia;hypertension;sedentary lifestyle     Objective:    There were no vitals filed for this visit. There is no height or weight on file to calculate BMI.  Advanced Directives 04/20/2020 04/08/2019  Does Patient Have a Medical Advance Directive? Yes Yes  Type of Paramedic of [place];Living will Living will  Copy of [place] in Chart? Yes - validated most recent copy scanned in chart (See row information) -    Current Medications (verified) Outpatient Encounter Medications as of 04/20/2020  Medication Sig  . aspirin 81 MG chewable tablet Chew by mouth daily.  Marland Kitchen atorvastatin (LIPITOR) 40 MG tablet Take 1 tablet (40 mg total) by mouth daily.  . Cholecalciferol (VITAMIN D3) 50 MCG (2000 UT) capsule Take 2,000 Units by mouth daily.  Marland Kitchen diltiazem (CARDIZEM CD) 120 MG 24 hr  capsule Take 1 capsule (120 mg total) by mouth in the morning and at bedtime.  Marland Kitchen escitalopram (LEXAPRO) 10 MG tablet Take 1 tablet (10 mg total) by mouth daily.  . famotidine (PEPCID) 40 MG tablet Take 1 tablet (40 mg total) by mouth daily.  . fentaNYL (DURAGESIC) 50 MCG/HR Place 1 patch onto the skin every 3 (three) days.  . pregabalin (LYRICA) 150 MG capsule Take 1 capsule (150 mg total) by mouth 2 (two) times daily.   No facility-administered encounter medications on file as of 04/20/2020.    Allergies (verified) Patient has no known allergies.   History: Past Medical History:  Diagnosis Date  . Chronic pain   . Hyperlipidemia   . Hypertension   . Squamous cell carcinoma of skin 09/10/2019   R dorsum hand and L wrist   . Squamous cell carcinoma of skin 10/28/2019   R dorsum hand prox lateral   . Squamous cell carcinoma of skin 10/28/2019   R dorsum hand prox medial   . Squamous cell carcinoma of skin 10/28/2019   R dorsum hand distal lateral   . Squamous cell carcinoma of skin 10/28/2019   R dorsum hand distal medial    Past Surgical History:  Procedure Laterality Date  . ABDOMINAL HYSTERECTOMY    . AUGMENTATION MAMMAPLASTY Bilateral    25-30 years ago  . CAROTID ARTERY ANGIOPLASTY Right 1995(approximate)  . CAROTID ENDARTERECTOMY  2001  . COSMETIC SURGERY    . ELBOW SURGERY    . TONSILLECTOMY     Family History  Problem Relation Age of Onset  . Cancer Mother   . Cancer Father   .  Breast cancer Neg Hx    Social History   Socioeconomic History  . Marital status: Married    Spouse name: Not on file  . Number of children: 3  . Years of education: Not on file  . Highest education level: Some college, no degree  Occupational History  . Occupation: retired  Tobacco Use  . Smoking status: Former Smoker    Types: Cigarettes  . Smokeless tobacco: Never Used  . Tobacco comment: quit around 1990-1995 (?)  Vaping Use  . Vaping Use: Never used  Substance and  Sexual Activity  . Alcohol use: Yes    Alcohol/week: 21.0 standard drinks    Types: 21 Shots of liquor per week    Comment: 3 scotch drinks a night  . Drug use: Never  . Sexual activity: Not on file  Other Topics Concern  . Not on file  Social History Narrative  . Not on file   Social Determinants of Health   Financial Resource Strain: Low Risk   . Difficulty of Paying Living Expenses: Not hard at all  Food Insecurity: No Food Insecurity  . Worried About Charity fundraiser in the Last Year: Never true  . Ran Out of Food in the Last Year: Never true  Transportation Needs: No Transportation Needs  . Lack of Transportation (Medical): No  . Lack of Transportation (Non-Medical): No  Physical Activity: Insufficiently Active  . Days of Exercise per Week: 7 days  . Minutes of Exercise per Session: 10 min  Stress: No Stress Concern Present  . Feeling of Stress : Not at all  Social Connections: Moderately Isolated  . Frequency of Communication with Friends and Family: More than three times a week  . Frequency of Social Gatherings with Friends and Family: Twice a week  . Attends Religious Services: Never  . Active Member of Clubs or Organizations: No  . Attends Archivist Meetings: Never  . Marital Status: Married    Tobacco Counseling Counseling given: Not Answered Comment: quit around [id number] (?)   Clinical Intake:  Pre-visit preparation completed: Yes  Pain : No/denies pain     Nutritional Risks: None Diabetes: No  How often do you need to have someone help you when you read instructions, pamphlets, or other written materials from your doctor or pharmacy?: 1 - Never  Diabetic? No  Interpreter Needed?: No  Information entered by :: [facility], LPN   Activities of Daily Living In your present state of health, do you have any difficulty performing the following activities: 04/20/2020  Hearing? N  Vision? N  Difficulty concentrating or making  decisions? N  Walking or climbing stairs? N  Dressing or bathing? N  Doing errands, shopping? N  Preparing Food and eating ? N  Using the Toilet? N  In the past six months, have you accidently leaked urine? Y  Comment Daily with urges/pressure.  Do you have problems with loss of bowel control? N  Managing your Medications? N  Managing your Finances? N  Housekeeping or managing your Housekeeping? N  Some recent data might be hidden    Patient Care Team: Virginia Crews, MD as PCP - General (Family Medicine) Ralene Bathe, MD (Dermatology) Corliss Parish, MD as Consulting Physician (Nephrology) Lorelee Cover., MD (Ophthalmology)  Indicate any recent Medical Services you may have received from other than [facility] providers in the past year (date may be approximate).     Assessment:   This is a routine wellness examination  for [facility].  Hearing/Vision screen No exam data present  Dietary issues and exercise activities discussed: Current Exercise Habits: Home exercise routine, Type of exercise: walking, Time (Minutes): 15, Frequency (Times/Week): 7, Weekly Exercise (Minutes/Week): 105, Intensity: Mild, Exercise limited by: None identified  Goals    . DIET - INCREASE WATER INTAKE     Recommend to drink at least 6-8 8oz glasses of water per day.      Depression Screen PHQ 2/9 Scores 04/20/2020 04/08/2019 10/02/2018 06/17/2018  PHQ - 2 Score 0 0 0 0    Fall Risk Fall Risk  04/20/2020 04/08/2019 10/02/2018 06/17/2018 06/06/2018  Falls in the past year? 0 0 0 0 0  Number falls in past yr: 0 0 - - -  Injury with Fall? 0 0 - - -    Any stairs in or around the home? No  If so, are there any without handrails? No  Home free of loose throw rugs in walkways, pet beds, electrical cords, etc? Yes  Adequate lighting in your home to reduce risk of falls? Yes   ASSISTIVE DEVICES UTILIZED TO PREVENT FALLS:  Life alert? No  Use of a cane, walker or w/c? No  Grab bars in the  bathroom? No  Shower chair or bench in shower? No  Elevated toilet seat or a handicapped toilet? No    Cognitive Function:     6CIT Screen 04/20/2020 04/08/2019  What Year? 0 points 0 points  What month? 0 points 0 points  What time? 0 points 0 points  Count back from 20 0 points 0 points  Months in reverse 0 points 0 points  Repeat phrase 0 points 0 points  Total Score 0 0    Immunizations Immunization History  Administered Date(s) Administered  . Fluad Quad(high Dose 65+) 04/09/2019  . Hepatitis A 02/07/2011, 08/07/2011  . Influenza-Unspecified 03/20/2020  . PFIZER SARS-COV-2 Vaccination 06/02/2019, 06/23/2019  . Pneumococcal Conjugate-13 02/05/2014  . Pneumococcal Polysaccharide-23 12/16/2009, 04/18/2016  . Tdap 09/28/2007  . Varicella 09/27/2009, 11/01/2017    TDAP status: Due, Education has been provided regarding the importance of this vaccine. Advised may receive this vaccine at local pharmacy or Health Dept. Aware to provide a copy of the vaccination record if obtained from local pharmacy or Health Dept. Verbalized acceptance and understanding. Flu Vaccine status: Up to date Pneumococcal vaccine status: Up to date Covid-19 vaccine status: Completed vaccines  Qualifies for Shingles Vaccine? Yes   Zostavax completed No   Shingrix Completed?: No.    Education has been provided regarding the importance of this vaccine. Patient has been advised to call insurance company to determine out of pocket expense if they have not yet received this vaccine. Advised may also receive vaccine at local pharmacy or Health Dept. Verbalized acceptance and understanding.  Screening Tests Health Maintenance  Topic Date Due  . COLONOSCOPY  03/18/2018  . TETANUS/TDAP  04/20/2021 (Originally 09/27/2017)  . INFLUENZA VACCINE  Completed  . DEXA SCAN  Completed  . COVID-19 Vaccine  Completed  . Hepatitis C Screening  Completed  . PNA vac Low Risk Adult  Completed    Health  Maintenance  Health Maintenance Due  Topic Date Due  . COLONOSCOPY  03/18/2018    Colorectal cancer screening: Currently due, cologuard order placed today.  Mammogram status: Completed 02/05/20. Repeat every year Bone Density status: Completed 05/08/19. Results reflect: Bone density results: OSTEOPENIA. Repeat every 5 years.  Lung Cancer Screening: (Low Dose CT Chest recommended if Age 39-80 years,  30 pack-year currently smoking OR have quit w/in 15years.) does not qualify.    Additional Screening:  Hepatitis C Screening: Up to date  Vision Screening: Recommended annual ophthalmology exams for early detection of glaucoma and other disorders of the eye. Is the patient up to date with their annual eye exam?  Yes  Who is the provider or what is the name of the office in which the patient attends annual eye exams? Dr Gloriann Loan If pt is not established with a provider, would they like to be referred to a provider to establish care? No .   Dental Screening: Recommended annual dental exams for proper oral hygiene  Community Resource Referral / Chronic Care Management: CRR required this visit?  No   CCM required this visit?  No      Plan:     I have personally reviewed and noted the following in the patient's chart:   . Medical and social history . Use of alcohol, tobacco or illicit drugs  . Current medications and supplements . Functional ability and status . Nutritional status . Physical activity . Advanced directives . List of other physicians . Hospitalizations, surgeries, and ER visits in previous 12 months . Vitals . Screenings to include cognitive, depression, and falls . Referrals and appointments  In addition, I have reviewed and discussed with patient certain preventive protocols, quality metrics, and best practice recommendations. A written personalized care plan for preventive services as well as general preventive health recommendations were provided to patient.      Carolyne Whitsel [place], [place]   [id number]/[id number]   Nurse Notes: Cologuard order placed today. Pt declined a colonoscopy referral at this time.

## 2020-04-20 ENCOUNTER — Encounter: Payer: Self-pay | Admitting: Family Medicine

## 2020-04-20 ENCOUNTER — Ambulatory Visit (INDEPENDENT_AMBULATORY_CARE_PROVIDER_SITE_OTHER): Payer: Medicare Other

## 2020-04-20 ENCOUNTER — Other Ambulatory Visit: Payer: Self-pay

## 2020-04-20 ENCOUNTER — Ambulatory Visit (INDEPENDENT_AMBULATORY_CARE_PROVIDER_SITE_OTHER): Payer: Medicare Other | Admitting: Family Medicine

## 2020-04-20 VITALS — BP 190/90 | HR 69 | Temp 98.7°F | Resp 16 | Ht 62.0 in | Wt 131.2 lb

## 2020-04-20 DIAGNOSIS — Z1211 Encounter for screening for malignant neoplasm of colon: Secondary | ICD-10-CM

## 2020-04-20 DIAGNOSIS — Z0000 Encounter for general adult medical examination without abnormal findings: Secondary | ICD-10-CM

## 2020-04-20 DIAGNOSIS — M79632 Pain in left forearm: Secondary | ICD-10-CM | POA: Diagnosis not present

## 2020-04-20 DIAGNOSIS — N1831 Chronic kidney disease, stage 3a: Secondary | ICD-10-CM

## 2020-04-20 DIAGNOSIS — Z86010 Personal history of colonic polyps: Secondary | ICD-10-CM | POA: Diagnosis not present

## 2020-04-20 DIAGNOSIS — I10 Essential (primary) hypertension: Secondary | ICD-10-CM

## 2020-04-20 DIAGNOSIS — K219 Gastro-esophageal reflux disease without esophagitis: Secondary | ICD-10-CM

## 2020-04-20 DIAGNOSIS — R7303 Prediabetes: Secondary | ICD-10-CM

## 2020-04-20 DIAGNOSIS — E782 Mixed hyperlipidemia: Secondary | ICD-10-CM | POA: Diagnosis not present

## 2020-04-20 MED ORDER — FENTANYL 50 MCG/HR TD PT72
1.0000 | MEDICATED_PATCH | TRANSDERMAL | 0 refills | Status: DC
Start: 2020-04-20 — End: 2020-05-27

## 2020-04-20 MED ORDER — LOSARTAN POTASSIUM 50 MG PO TABS: 50.0000 mg | ORAL_TABLET | Freq: Every day | ORAL | 3 refills | Status: DC

## 2020-04-20 NOTE — Assessment & Plan Note (Signed)
Longstanding dry cough may be related to silent reflux given that it occurs more after laying down for some period of time Encouraged dietary changes and elevating head of the bed Increase Pepcid to twice daily to see if this helps Discussed return precautions

## 2020-04-20 NOTE — Assessment & Plan Note (Signed)
Encourage low-carb diet Check A1c

## 2020-04-20 NOTE — Patient Instructions (Signed)
Preventive Care 76 Years and Older, Female Preventive care refers to lifestyle choices and visits with your health care provider that can promote health and wellness. This includes:  A yearly physical exam. This is also called an annual well check.  Regular dental and eye exams.  Immunizations.  Screening for certain conditions.  Healthy lifestyle choices, such as diet and exercise. What can I expect for my preventive care visit? Physical exam Your health care provider will check:  Height and weight. These may be used to calculate body mass index (BMI), which is a measurement that tells if you are at a healthy weight.  Heart rate and blood pressure.  Your skin for abnormal spots. Counseling Your health care provider may ask you questions about:  Alcohol, tobacco, and drug use.  Emotional well-being.  Home and relationship well-being.  Sexual activity.  Eating habits.  History of falls.  Memory and ability to understand (cognition).  Work and work Statistician.  Pregnancy and menstrual history. What immunizations do I need?  Influenza (flu) vaccine  This is recommended every year. Tetanus, diphtheria, and pertussis (Tdap) vaccine  You may need a Td booster every 10 years. Varicella (chickenpox) vaccine  You may need this vaccine if you have not already been vaccinated. Zoster (shingles) vaccine  You may need this after age 76. Pneumococcal conjugate (PCV13) vaccine  One dose is recommended after age 76. Pneumococcal polysaccharide (PPSV23) vaccine  One dose is recommended after age 76. Measles, mumps, and rubella (MMR) vaccine  You may need at least one dose of MMR if you were born in 1959 or later. You may also need a second dose. Meningococcal conjugate (MenACWY) vaccine  You may need this if you have certain conditions. Hepatitis A vaccine  You may need this if you have certain conditions or if you travel or work in places where you may be exposed  to hepatitis A. Hepatitis B vaccine  You may need this if you have certain conditions or if you travel or work in places where you may be exposed to hepatitis B. Haemophilus influenzae type b (Hib) vaccine  You may need this if you have certain conditions. You may receive vaccines as individual doses or as more than one vaccine together in one shot (combination vaccines). Talk with your health care provider about the risks and benefits of combination vaccines. What tests do I need? Blood tests  Lipid and cholesterol levels. These may be checked every 5 years, or more frequently depending on your overall health.  Hepatitis C test.  Hepatitis B test. Screening  Lung cancer screening. You may have this screening every year starting at age 76 if you have a 30-pack-year history of smoking and currently smoke or have quit within the past 15 years.  Colorectal cancer screening. All adults should have this screening starting at age 76 and continuing until age 15. Your health care provider may recommend screening at age 76 if you are at increased risk. You will have tests every 1-10 years, depending on your results and the type of screening test.  Diabetes screening. This is done by checking your blood sugar (glucose) after you have not eaten for a while (fasting). You may have this done every 1-3 years.  Mammogram. This may be done every 1-2 years. Talk with your health care provider about how often you should have regular mammograms.  BRCA-related cancer screening. This may be done if you have a family history of breast, ovarian, tubal, or peritoneal cancers.  Other tests  Sexually transmitted disease (STD) testing.  Bone density scan. This is done to screen for osteoporosis. You may have this done starting at age 76. Follow these instructions at home: Eating and drinking  Eat a diet that includes fresh fruits and vegetables, whole grains, lean protein, and low-fat dairy products. Limit  your intake of foods with high amounts of sugar, saturated fats, and salt.  Take vitamin and mineral supplements as recommended by your health care provider.  Do not drink alcohol if your health care provider tells you not to drink.  If you drink alcohol: ? Limit how much you have to 0-1 drink a day. ? Be aware of how much alcohol is in your drink. In the [place] one drink equals one 12 oz bottle of beer (355 mL), one 5 oz glass of wine (148 mL), or one 1 oz glass of hard liquor (44 mL). Lifestyle  Take daily care of your teeth and gums.  Stay active. Exercise for at least 30 minutes on 5 or more days each week.  Do not use any products that contain nicotine or tobacco, such as cigarettes, e-cigarettes, and chewing tobacco. If you need help quitting, ask your health care provider.  If you are sexually active, practice safe sex. Use a condom or other form of protection in order to prevent STIs (sexually transmitted infections).  Talk with your health care provider about taking a low-dose aspirin or statin. What's next?  Go to your health care provider once a year for a well check visit.  Ask your health care provider how often you should have your eyes and teeth checked.  Stay up to date on all vaccines. This information is not intended to replace advice given to you by your health care provider. Make sure you discuss any questions you have with your health care provider. Document Revised: 05/02/2018 Document Reviewed: 05/02/2018 Elsevier Patient Education  2020 [employer].

## 2020-04-20 NOTE — Assessment & Plan Note (Signed)
Chronic, uncontrolled Previously well controlled at last visit, but elevated recently at nephrology and on home readings Continue diltiazem at current dose-would not increase dose given relatively low pulse today We will start losartan 50 mg daily-avoiding lisinopril given current dry cough and discussed benefit of ARB in CKD as below Continue to monitor home readings ROI sent to nephrology for recent renal function panel Follow-up in 1 month and consider further dose titration Recheck BMP or renal function panel at next visit

## 2020-04-20 NOTE — Patient Instructions (Signed)
Dawn Bruce , Thank you for taking time to come for your Medicare Wellness Visit. I appreciate your ongoing commitment to your health goals. Please review the following plan we discussed and let me know if I can assist you in the future.   Screening recommendations/referrals: Colonoscopy: Currently due, cologuard ordered today. Mammogram: Up to date, due 01/2021 Bone Density: Up to date, due 04/2024 Recommended yearly ophthalmology/optometry visit for glaucoma screening and checkup Recommended yearly dental visit for hygiene and checkup  Vaccinations: Influenza vaccine: Done 02/2020 Pneumococcal vaccine: Completed series Tdap vaccine: Currently due, declined at this time. Shingles vaccine: Shingrix discussed. Please contact your pharmacy for coverage information.     Advanced directives: Currently on file.  Conditions/risks identified: Recommend to drink at least 6-8 8oz glasses of water per day.  Next appointment: 10:20 AM today with Dr Brita Romp   Preventive Care 76 Years and Older, Female Preventive care refers to lifestyle choices and visits with your health care provider that can promote health and wellness. What does preventive care include?  A yearly physical exam. This is also called an annual well check.  Dental exams once or twice a year.  Routine eye exams. Ask your health care provider how often you should have your eyes checked.  Personal lifestyle choices, including:  Daily care of your teeth and gums.  Regular physical activity.  Eating a healthy diet.  Avoiding tobacco and drug use.  Limiting alcohol use.  Practicing safe sex.  Taking low-dose aspirin every day.  Taking vitamin and mineral supplements as recommended by your health care provider. What happens during an annual well check? The services and screenings done by your health care provider during your annual well check will depend on your age, overall health, lifestyle risk factors, and family  history of disease. Counseling  Your health care provider may ask you questions about your:  Alcohol use.  Tobacco use.  Drug use.  Emotional well-being.  Home and relationship well-being.  Sexual activity.  Eating habits.  History of falls.  Memory and ability to understand (cognition).  Work and work Statistician.  Reproductive health. Screening  You may have the following tests or measurements:  Height, weight, and BMI.  Blood pressure.  Lipid and cholesterol levels. These may be checked every 5 years, or more frequently if you are over 22 years old.  Skin check.  Lung cancer screening. You may have this screening every year starting at age 34 if you have a 30-pack-year history of smoking and currently smoke or have quit within the past 15 years.  Fecal occult blood test (FOBT) of the stool. You may have this test every year starting at age 76.  Flexible sigmoidoscopy or colonoscopy. You may have a sigmoidoscopy every 5 years or a colonoscopy every 10 years starting at age 76.  Hepatitis C blood test.  Hepatitis B blood test.  Sexually transmitted disease (STD) testing.  Diabetes screening. This is done by checking your blood sugar (glucose) after you have not eaten for a while (fasting). You may have this done every 1-3 years.  Bone density scan. This is done to screen for osteoporosis. You may have this done starting at age 26.  Mammogram. This may be done every 1-2 years. Talk to your health care provider about how often you should have regular mammograms. Talk with your health care provider about your test results, treatment options, and if necessary, the need for more tests. Vaccines  Your health care provider may recommend certain  vaccines, such as:  Influenza vaccine. This is recommended every year.  Tetanus, diphtheria, and acellular pertussis (Tdap, Td) vaccine. You may need a Td booster every 10 years.  Zoster vaccine. You may need this after  age 24.  Pneumococcal 13-valent conjugate (PCV13) vaccine. One dose is recommended after age 76.  Pneumococcal polysaccharide (PPSV23) vaccine. One dose is recommended after age 76. Talk to your health care provider about which screenings and vaccines you need and how often you need them. This information is not intended to replace advice given to you by your health care provider. Make sure you discuss any questions you have with your health care provider. Document Released: 06/04/2015 Document Revised: 01/26/2016 Document Reviewed: 03/09/2015 Elsevier Interactive Patient Education  2017 [place] Prevention in the Home Falls can cause injuries. They can happen to people of all ages. There are many things you can do to make your home safe and to help prevent falls. What can I do on the outside of my home?  Regularly fix the edges of walkways and driveways and fix any cracks.  Remove anything that might make you trip as you walk through a door, such as a raised step or threshold.  Trim any bushes or trees on the path to your home.  Use bright outdoor lighting.  Clear any walking paths of anything that might make someone trip, such as rocks or tools.  Regularly check to see if handrails are loose or broken. Make sure that both sides of any steps have handrails.  Any raised decks and porches should have guardrails on the edges.  Have any leaves, snow, or ice cleared regularly.  Use sand or salt on walking paths during winter.  Clean up any spills in your garage right away. This includes oil or grease spills. What can I do in the bathroom?  Use night lights.  Install grab bars by the toilet and in the tub and shower. Do not use towel bars as grab bars.  Use non-skid mats or decals in the tub or shower.  If you need to sit down in the shower, use a plastic, non-slip stool.  Keep the floor dry. Clean up any water that spills on the floor as soon as it  happens.  Remove soap buildup in the tub or shower regularly.  Attach bath mats securely with double-sided non-slip rug tape.  Do not have throw rugs and other things on the floor that can make you trip. What can I do in the bedroom?  Use night lights.  Make sure that you have a light by your bed that is easy to reach.  Do not use any sheets or blankets that are too big for your bed. They should not hang down onto the floor.  Have a firm chair that has side arms. You can use this for support while you get dressed.  Do not have throw rugs and other things on the floor that can make you trip. What can I do in the kitchen?  Clean up any spills right away.  Avoid walking on wet floors.  Keep items that you use a lot in easy-to-reach places.  If you need to reach something above you, use a strong step stool that has a grab bar.  Keep electrical cords out of the way.  Do not use floor polish or wax that makes floors slippery. If you must use wax, use non-skid floor wax.  Do not have throw rugs and other things  on the floor that can make you trip. What can I do with my stairs?  Do not leave any items on the stairs.  Make sure that there are handrails on both sides of the stairs and use them. Fix handrails that are broken or loose. Make sure that handrails are as long as the stairways.  Check any carpeting to make sure that it is firmly attached to the stairs. Fix any carpet that is loose or worn.  Avoid having throw rugs at the top or bottom of the stairs. If you do have throw rugs, attach them to the floor with carpet tape.  Make sure that you have a light switch at the top of the stairs and the bottom of the stairs. If you do not have them, ask someone to add them for you. What else can I do to help prevent falls?  Wear shoes that:  Do not have high heels.  Have rubber bottoms.  Are comfortable and fit you well.  Are closed at the toe. Do not wear sandals.  If you  use a stepladder:  Make sure that it is fully opened. Do not climb a closed stepladder.  Make sure that both sides of the stepladder are locked into place.  Ask someone to hold it for you, if possible.  Clearly mark and make sure that you can see:  Any grab bars or handrails.  First and last steps.  Where the edge of each step is.  Use tools that help you move around (mobility aids) if they are needed. These include:  Canes.  Walkers.  Scooters.  Crutches.  Turn on the lights when you go into a dark area. Replace any light bulbs as soon as they burn out.  Set up your furniture so you have a clear path. Avoid moving your furniture around.  If any of your floors are uneven, fix them.  If there are any pets around you, be aware of where they are.  Review your medicines with your doctor. Some medicines can make you feel dizzy. This can increase your chance of falling. Ask your doctor what other things that you can do to help prevent falls. This information is not intended to replace advice given to you by your health care provider. Make sure you discuss any questions you have with your health care provider. Document Released: 03/04/2009 Document Revised: 10/14/2015 Document Reviewed: 06/12/2014 Elsevier Interactive Patient Education  2017 [employer].

## 2020-04-20 NOTE — Assessment & Plan Note (Signed)
Followed by nephrology Some recent worsening per patient ROI sent to [facility] for records and recent labs Avoid nephrotoxic medications Starting ARB as above

## 2020-04-20 NOTE — Assessment & Plan Note (Signed)
Previously well controlled Continue statin Repeat FLP and CMP Goal LDL < 70 

## 2020-04-20 NOTE — Assessment & Plan Note (Signed)
Related to previous nerve injury Continue fentanyl and Lyrica Has pain contract Fentanyl patch refill sent in today

## 2020-04-20 NOTE — Progress Notes (Signed)
Complete physical exam   Patient: Dawn Bruce   DOB: 1946/05/14   74 y.o. Female  MRN: [id number] Visit Date: 04/20/2020  Today's healthcare provider: Lavon Paganini, MD   Chief Complaint  Patient presents with  . Annual Exam   Subjective    Dawn Bruce is a 74 y.o. female who presents today for a complete physical exam.  She reports consuming a general diet. Home exercise routine includes walking 2.5 hrs per week. She generally feels fairly well. She reports sleeping fairly well. She does have additional problems to discuss today. Elevated blood pressure, cough, and kidney function is low. HPI  02/05/2020 Mammogram-BI-RADS 1 05/08/2019 BMD-Osteopenia 03/18/2020 Colonoscopy  Past Medical History:  Diagnosis Date  . Chronic pain   . Hyperlipidemia   . Hypertension   . Squamous cell carcinoma of skin 09/10/2019   R dorsum hand and L wrist   . Squamous cell carcinoma of skin 10/28/2019   R dorsum hand prox lateral   . Squamous cell carcinoma of skin 10/28/2019   R dorsum hand prox medial   . Squamous cell carcinoma of skin 10/28/2019   R dorsum hand distal lateral   . Squamous cell carcinoma of skin 10/28/2019   R dorsum hand distal medial    Past Surgical History:  Procedure Laterality Date  . ABDOMINAL HYSTERECTOMY    . AUGMENTATION MAMMAPLASTY Bilateral    25-30 years ago  . CAROTID ARTERY ANGIOPLASTY Right 1995(approximate)  . CAROTID ENDARTERECTOMY  2001  . COSMETIC SURGERY    . ELBOW SURGERY    . TONSILLECTOMY     Social History   Socioeconomic History  . Marital status: Married    Spouse name: Not on file  . Number of children: 3  . Years of education: Not on file  . Highest education level: Some college, no degree  Occupational History  . Occupation: retired  Tobacco Use  . Smoking status: Former Smoker    Types: Cigarettes  . Smokeless tobacco: Never Used  . Tobacco comment: quit around 1990-1995 (?)  Vaping Use  . Vaping Use: Never used   Substance and Sexual Activity  . Alcohol use: Yes    Alcohol/week: 21.0 standard drinks    Types: 21 Shots of liquor per week    Comment: 3 scotch drinks a night  . Drug use: Never  . Sexual activity: Not on file  Other Topics Concern  . Not on file  Social History Narrative  . Not on file   Social Determinants of Health   Financial Resource Strain: Low Risk   . Difficulty of Paying Living Expenses: Not hard at all  Food Insecurity: No Food Insecurity  . Worried About Charity fundraiser in the Last Year: Never true  . Ran Out of Food in the Last Year: Never true  Transportation Needs: No Transportation Needs  . Lack of Transportation (Medical): No  . Lack of Transportation (Non-Medical): No  Physical Activity: Insufficiently Active  . Days of Exercise per Week: 7 days  . Minutes of Exercise per Session: 10 min  Stress: No Stress Concern Present  . Feeling of Stress : Not at all  Social Connections: Moderately Isolated  . Frequency of Communication with Friends and Family: More than three times a week  . Frequency of Social Gatherings with Friends and Family: Twice a week  . Attends Religious Services: Never  . Active Member of Clubs or Organizations: No  . Attends Archivist Meetings:  Never  . Marital Status: Married  Human resources officer Violence: Not At Risk  . Fear of Current or Ex-Partner: No  . Emotionally Abused: No  . Physically Abused: No  . Sexually Abused: No   Family Status  Relation Name Status  . Mother  Deceased  . Father  Deceased  . Sister  Alive  . MGM  Deceased  . MGF  Deceased  . PGM  Deceased  . PGF  Deceased  . Son  Alive  . Daughter  Alive  . Daughter  Alive  . Neg Hx  (Not Specified)   Family History  Problem Relation Age of Onset  . Cancer Mother   . Cancer Father   . Breast cancer Neg Hx    No Known Allergies  Patient Care Team: Virginia Crews, MD as PCP - General (Family Medicine) Ralene Bathe, MD  (Dermatology) Corliss Parish, MD as Consulting Physician (Nephrology) Lorelee Cover., MD (Ophthalmology)   Medications: Outpatient Medications Prior to Visit  Medication Sig  . aspirin 81 MG chewable tablet Chew by mouth daily.  Marland Kitchen atorvastatin (LIPITOR) 40 MG tablet Take 1 tablet (40 mg total) by mouth daily.  . Cholecalciferol (VITAMIN D3) 50 MCG (2000 UT) capsule Take 2,000 Units by mouth daily.  Marland Kitchen diltiazem (CARDIZEM CD) 120 MG 24 hr capsule Take 1 capsule (120 mg total) by mouth in the morning and at bedtime.  Marland Kitchen escitalopram (LEXAPRO) 10 MG tablet Take 1 tablet (10 mg total) by mouth daily.  . famotidine (PEPCID) 40 MG tablet Take 1 tablet (40 mg total) by mouth daily.  . pregabalin (LYRICA) 150 MG capsule Take 1 capsule (150 mg total) by mouth 2 (two) times daily.  . [DISCONTINUED] fentaNYL (DURAGESIC) 50 MCG/HR Place 1 patch onto the skin every 3 (three) days.   No facility-administered medications prior to visit.    Review of Systems  Constitutional: Negative for chills and fever.  HENT: Negative for congestion, sinus pressure, sinus pain and sore throat.   Respiratory: Positive for cough and chest tightness. Negative for shortness of breath and wheezing.   Cardiovascular: Positive for palpitations. Negative for chest pain.  Gastrointestinal: Negative.   Genitourinary: Negative.   Musculoskeletal: Negative.   Skin: Negative.   Allergic/Immunologic: Negative.   Neurological: Negative.   Hematological: Negative.   Psychiatric/Behavioral: Negative.     Last CBC Lab Results  Component Value Date   WBC 6.8 01/02/2019   HGB 14.7 01/02/2019   HCT 43.9 01/02/2019   MCV 89 01/02/2019   MCH 29.7 01/02/2019   RDW 13.9 01/02/2019   PLT 179 32/20/2542   Last metabolic panel Lab Results  Component Value Date   GLUCOSE 92 10/08/2019   NA 146 (H) 10/08/2019   K 4.6 10/08/2019   CL 105 10/08/2019   CO2 27 10/08/2019   BUN 27 10/08/2019   CREATININE 1.15 (H)  10/08/2019   GFRNONAA 47 (L) 10/08/2019   GFRAA 54 (L) 10/08/2019   CALCIUM 9.8 10/08/2019   PHOS 3.9 01/24/2019   PROT 6.4 10/08/2019   ALBUMIN 4.4 10/08/2019   LABGLOB 2.0 10/08/2019   AGRATIO 2.2 10/08/2019   BILITOT 0.5 10/08/2019   ALKPHOS 89 10/08/2019   AST 27 10/08/2019   ALT 19 10/08/2019   Last lipids Lab Results  Component Value Date   CHOL 195 10/08/2019   HDL 67 10/08/2019   LDLCALC 108 (H) 10/08/2019   TRIG 112 10/08/2019   CHOLHDL 2.9 10/08/2019   Last hemoglobin A1c  Lab Results  Component Value Date   HGBA1C 6.0 (H) 10/08/2019   Last thyroid functions Lab Results  Component Value Date   TSH 2.68 01/03/2018   TSH 2.68 01/03/2018   Last vitamin D Lab Results  Component Value Date   25OHVITD2 3.3 06/06/2018   25OHVITD3 42 06/06/2018   VD25OH 40.9 01/03/2018   VD25OH 40.9 01/03/2018   Last vitamin B12 and Folate Lab Results  Component Value Date   VITAMINB12 444 06/06/2018      Objective    BP (!) 190/90 (BP Location: Right Arm, Patient Position: Sitting, Cuff Size: Normal)   Pulse 69   Temp 98.7 F (37.1 C) (Oral)   Resp 16   Ht 5\' 2"  (1.575 m)   Wt 131 lb 3.2 oz (59.5 kg)   SpO2 97%   BMI 24.00 kg/m  BP Readings from Last 3 Encounters:  04/20/20 (!) 190/90  10/08/19 132/78  04/09/19 (!) 145/77   Wt Readings from Last 3 Encounters:  04/20/20 131 lb 3.2 oz (59.5 kg)  10/08/19 135 lb (61.2 kg)  04/09/19 134 lb (60.8 kg)      Physical Exam Vitals reviewed.  Constitutional:      General: She is not in acute distress.    Appearance: Normal appearance. She is well-developed. She is not diaphoretic.  HENT:     Head: Normocephalic and atraumatic.     Right Ear: Tympanic membrane, ear canal and external ear normal.     Left Ear: Tympanic membrane, ear canal and external ear normal.     Nose: Nose normal.     Mouth/Throat:     Mouth: Mucous membranes are moist.     Pharynx: Oropharynx is clear. No oropharyngeal exudate.  Eyes:      General: No scleral icterus.    Conjunctiva/sclera: Conjunctivae normal.     Pupils: Pupils are equal, round, and reactive to light.  Neck:     Thyroid: No thyromegaly.  Cardiovascular:     Rate and Rhythm: Normal rate and regular rhythm.     Pulses: Normal pulses.     Heart sounds: Normal heart sounds. No murmur heard.   Pulmonary:     Effort: Pulmonary effort is normal. No respiratory distress.     Breath sounds: Normal breath sounds. No wheezing or rales.  Abdominal:     General: There is no distension.     Palpations: Abdomen is soft.     Tenderness: There is no abdominal tenderness.  Musculoskeletal:        General: No deformity.     Cervical back: Neck supple.     Right lower leg: No edema.     Left lower leg: No edema.  Lymphadenopathy:     Cervical: No cervical adenopathy.  Skin:    General: Skin is warm and dry.     Findings: No rash.  Neurological:     Mental Status: She is alert and oriented to person, place, and time. Mental status is at baseline.     Sensory: No sensory deficit.     Motor: No weakness.     Gait: Gait normal.  Psychiatric:        Mood and Affect: Mood normal.        Behavior: Behavior normal.        Thought Content: Thought content normal.       Last depression screening scores PHQ 2/9 Scores 04/20/2020 04/08/2019 10/02/2018  PHQ - 2 Score 0 0 0   Last  fall risk screening Fall Risk  04/20/2020  Falls in the past year? 0  Number falls in past yr: 0  Injury with Fall? 0   Last Audit-C alcohol use screening Alcohol Use Disorder Test (AUDIT) 04/20/2020  1. How often do you have a drink containing alcohol? 4  2. How many drinks containing alcohol do you have on a typical day when you are drinking? 1  3. How often do you have six or more drinks on one occasion? 0  AUDIT-C Score 5  4. How often during the last year have you found that you were not able to stop drinking once you had started? 0  5. How often during the last year have you  failed to do what was normally expected from you because of drinking? 0  6. How often during the last year have you needed a first drink in the morning to get yourself going after a heavy drinking session? 0  7. How often during the last year have you had a feeling of guilt of remorse after drinking? 0  8. How often during the last year have you been unable to remember what happened the night before because you had been drinking? 0  9. Have you or someone else been injured as a result of your drinking? 0  10. Has a relative or friend or a doctor or another health worker been concerned about your drinking or suggested you cut down? 0  Alcohol Use Disorder Identification Test Final Score (AUDIT) 5  Alcohol Brief Interventions/Follow-up AUDIT Score <7 follow-up not indicated   A score of 3 or more in women, and 4 or more in men indicates increased risk for alcohol abuse, EXCEPT if all of the points are from question 1   No results found for any visits on 04/20/20.  Assessment & Plan    Routine Health Maintenance and Physical Exam  Exercise Activities and Dietary recommendations Goals    . DIET - INCREASE WATER INTAKE     Recommend to drink at least 6-8 8oz glasses of water per day.       Immunization History  Administered Date(s) Administered  . Fluad Quad(high Dose 65+) 04/09/2019  . Hepatitis A 02/07/2011, 08/07/2011  . Influenza-Unspecified 03/20/2020  . PFIZER SARS-COV-2 Vaccination 06/02/2019, 06/23/2019  . Pneumococcal Conjugate-13 02/05/2014  . Pneumococcal Polysaccharide-23 12/16/2009, 04/18/2016  . Tdap 09/28/2007  . Varicella 09/27/2009, 11/01/2017    Health Maintenance  Topic Date Due  . COLONOSCOPY  03/18/2018  . TETANUS/TDAP  04/20/2021 (Originally 09/27/2017)  . INFLUENZA VACCINE  Completed  . DEXA SCAN  Completed  . COVID-19 Vaccine  Completed  . Hepatitis C Screening  Completed  . PNA vac Low Risk Adult  Completed    Discussed health benefits of physical  activity, and encouraged her to engage in regular exercise appropriate for her age and condition.  Problem List Items Addressed This Visit      Cardiovascular and Mediastinum   Essential hypertension    Chronic, uncontrolled Previously well controlled at last visit, but elevated recently at nephrology and on home readings Continue diltiazem at current dose-would not increase dose given relatively low pulse today We will start losartan 50 mg daily-avoiding lisinopril given current dry cough and discussed benefit of ARB in CKD as below Continue to monitor home readings ROI sent to nephrology for recent renal function panel Follow-up in 1 month and consider further dose titration Recheck BMP or renal function panel at next visit  Relevant Medications   losartan (COZAAR) 50 MG tablet     Digestive   GERD (gastroesophageal reflux disease)    Longstanding dry cough may be related to silent reflux given that it occurs more after laying down for some period of time Encouraged dietary changes and elevating head of the bed Increase Pepcid to twice daily to see if this helps Discussed return precautions        Genitourinary   CKD (chronic kidney disease) stage 3, GFR 30-59 ml/min (HCC)    Followed by nephrology Some recent worsening per patient ROI sent to [facility] for records and recent labs Avoid nephrotoxic medications Starting ARB as above        Other   Chronic forearm pain (Primary Area of Pain) (Left) (Chronic)    Related to previous nerve injury Continue fentanyl and Lyrica Has pain contract Fentanyl patch refill sent in today      Hyperlipidemia    Previously well controlled Continue statin Repeat FLP and CMP Goal LDL < 70      Relevant Medications   losartan (COZAAR) 50 MG tablet   Other Relevant Orders   Lipid panel   Hepatic function panel   Prediabetes    Encourage low-carb diet Check A1c      Relevant Orders   Hemoglobin A1c    Other  Visit Diagnoses    Encounter for annual physical exam    -  Primary   Relevant Orders   Lipid panel   Hepatic function panel   Hemoglobin A1c   History of colon polyps       Relevant Orders   Ambulatory referral to Gastroenterology       Return in about 4 weeks (around 05/18/2020) for BP f/u.     I, Lavon Paganini, MD, have reviewed all documentation for this visit. The documentation on 04/20/20 for the exam, diagnosis, procedures, and orders are all accurate and complete.   Cesar Rogerson, Dionne Bucy, MD, MPH [facility]

## 2020-04-21 ENCOUNTER — Telehealth: Payer: Self-pay

## 2020-04-21 LAB — HEMOGLOBIN A1C
Est. average glucose Bld gHb Est-mCnc: 123 mg/dL
Hgb A1c MFr Bld: 5.9 % — ABNORMAL HIGH (ref 4.8–5.6)

## 2020-04-21 LAB — LIPID PANEL
Chol/HDL Ratio: 2.9 ratio (ref 0.0–4.4)
Cholesterol, Total: 178 mg/dL (ref 100–199)
HDL: 61 mg/dL (ref >39)
LDL Chol Calc (NIH): 102 mg/dL — ABNORMAL HIGH (ref 0–99)
Triglycerides: 79 mg/dL (ref 0–149)
VLDL Cholesterol Cal: 15 mg/dL (ref 5–40)

## 2020-04-21 LAB — HEPATIC FUNCTION PANEL
ALT: 12 IU/L (ref 0–32)
AST: 24 IU/L (ref 0–40)
Albumin: 4.1 g/dL (ref 3.7–4.7)
Alkaline Phosphatase: 106 IU/L (ref 44–121)
Bilirubin Total: 0.5 mg/dL (ref 0.0–1.2)
Bilirubin, Direct: 0.15 mg/dL (ref 0.00–0.40)
Total Protein: 6.5 g/dL (ref 6.0–8.5)

## 2020-04-21 NOTE — Telephone Encounter (Signed)
Pt advised.   Thanks,   -Annalycia Done  

## 2020-04-21 NOTE — Telephone Encounter (Signed)
-----   Message from Virginia Crews, MD sent at 04/21/2020 11:02 AM EST ----- Normal/stable labs

## 2020-04-23 MED ORDER — HYDRALAZINE HCL 10 MG PO TABS: 10.0000 mg | ORAL_TABLET | Freq: Three times a day (TID) | ORAL | 3 refills | Status: DC

## 2020-04-26 ENCOUNTER — Encounter: Payer: Self-pay | Admitting: Family Medicine

## 2020-04-27 MED ORDER — AMLODIPINE BESYLATE 5 MG PO TABS: 5.0000 mg | ORAL_TABLET | Freq: Every day | ORAL | 3 refills | Status: DC

## 2020-04-27 NOTE — Telephone Encounter (Signed)
Ok to send Amlodipine 5mg  daily #30 r3 to requested pharmacy. Thanks!

## 2020-04-28 ENCOUNTER — Encounter: Payer: Self-pay | Admitting: Dermatology

## 2020-04-28 ENCOUNTER — Other Ambulatory Visit: Payer: Self-pay

## 2020-04-28 ENCOUNTER — Ambulatory Visit: Payer: Medicare Other | Admitting: Dermatology

## 2020-04-28 DIAGNOSIS — L570 Actinic keratosis: Secondary | ICD-10-CM

## 2020-04-28 DIAGNOSIS — D0439 Carcinoma in situ of skin of other parts of face: Secondary | ICD-10-CM | POA: Diagnosis not present

## 2020-04-28 DIAGNOSIS — D099 Carcinoma in situ, unspecified: Secondary | ICD-10-CM

## 2020-04-28 DIAGNOSIS — L578 Other skin changes due to chronic exposure to nonionizing radiation: Secondary | ICD-10-CM | POA: Diagnosis not present

## 2020-04-28 DIAGNOSIS — Z85828 Personal history of other malignant neoplasm of skin: Secondary | ICD-10-CM

## 2020-04-28 NOTE — Patient Instructions (Addendum)
Cryotherapy Aftercare  . Wash gently with soap and water everyday.   . Apply Vaseline and Band-Aid daily until healed.  

## 2020-04-28 NOTE — Progress Notes (Signed)
   Follow-Up Visit   Subjective  Dawn Bruce is a 74 y.o. female who presents for the following: Actinic Keratosis (bx proven 03/04/20, L sideburn pt presents for LN2 today) and SQUAMOUS CELL CARCINOMA IN SITU ARISING IN A SEBORRHEIC (bx proven 03/04/20, R med infraorbital).  The following portions of the chart were reviewed this encounter and updated as appropriate:   Tobacco  Allergies  Meds  Problems  Med Hx  Surg Hx  Fam Hx     Review of Systems:  No other skin or systemic complaints except as noted in HPI or Assessment and Plan.  Objective  Well appearing patient in no apparent distress; mood and affect are within normal limits.  A focused examination was performed including face. Relevant physical exam findings are noted in the Assessment and Plan.  Objective  R medial infraorbital x 1: Pink bx site  Objective  L sideburn x 1: Pink bx site   Assessment & Plan    Actinic Damage - chronic, secondary to cumulative UV radiation exposure/sun exposure over time - diffuse scaly erythematous macules with underlying dyspigmentation - Recommend daily broad spectrum sunscreen SPF 30+ to sun-exposed areas, reapply every 2 hours as needed.  - Call for new or changing lesions.  History of Squamous Cell Carcinoma of the Skin - No evidence of recurrence today - No lymphadenopathy - Recommend regular full body skin exams - Recommend daily broad spectrum sunscreen SPF 30+ to sun-exposed areas, reapply every 2 hours as needed.  - Call if any new or changing lesions are noted between office visits   Squamous cell carcinoma in situ (SCCIS) R medial infraorbital x 1  Destruction of lesion Complexity: simple   Destruction method: cryotherapy   Informed consent: discussed and consent obtained   Timeout:  patient name, date of birth, surgical site, and procedure verified Lesion destroyed using liquid nitrogen: Yes   Region frozen until ice ball extended beyond lesion: Yes    Final wound size (cm):  1.2 Outcome: patient tolerated procedure well with no complications   Post-procedure details: wound care instructions given   Additional details:  Post treatment defect 1.2cm  SQUAMOUS CELL CARCINOMA IN SITU ARISING IN A SEBORRHEIC KERATOSIS  BX proven  Ln2 today    AK (actinic keratosis) L sideburn x 1  Destruction of lesion - L sideburn x 1 Complexity: simple   Destruction method: cryotherapy   Informed consent: discussed and consent obtained   Timeout:  patient name, date of birth, surgical site, and procedure verified Lesion destroyed using liquid nitrogen: Yes   Region frozen until ice ball extended beyond lesion: Yes   Outcome: patient tolerated procedure well with no complications   Post-procedure details: wound care instructions given    Return for April 2022 TBSE.   I, Othelia Pulling, RMA, am acting as scribe for Sarina Ser, MD .  Documentation: I have reviewed the above documentation for accuracy and completeness, and I agree with the above.  Sarina Ser, MD

## 2020-05-04 ENCOUNTER — Encounter: Payer: Self-pay | Admitting: Dermatology

## 2020-05-11 ENCOUNTER — Encounter: Payer: Self-pay | Admitting: Family Medicine

## 2020-05-11 MED ORDER — ESCITALOPRAM OXALATE 10 MG PO TABS
10.0000 mg | ORAL_TABLET | Freq: Every day | ORAL | 1 refills | Status: DC
Start: 2020-05-11 — End: 2020-06-28

## 2020-05-27 ENCOUNTER — Other Ambulatory Visit: Payer: Self-pay

## 2020-05-27 ENCOUNTER — Encounter: Payer: Self-pay | Admitting: Family Medicine

## 2020-05-27 ENCOUNTER — Ambulatory Visit (INDEPENDENT_AMBULATORY_CARE_PROVIDER_SITE_OTHER): Payer: Medicare Other | Admitting: Family Medicine

## 2020-05-27 VITALS — BP 125/85 | HR 80 | Temp 97.9°F | Wt 131.0 lb

## 2020-05-27 DIAGNOSIS — I10 Essential (primary) hypertension: Secondary | ICD-10-CM | POA: Diagnosis not present

## 2020-05-27 MED ORDER — FENTANYL 50 MCG/HR TD PT72: 1.0000 | MEDICATED_PATCH | TRANSDERMAL | 0 refills | Status: DC

## 2020-05-27 NOTE — Patient Instructions (Signed)
Call [facility] to schedule Colonoscopy [phone]

## 2020-05-27 NOTE — Assessment & Plan Note (Signed)
Previously Uncontrolled, refractory to diltiazem  Started on amlodipine 5mg  and Hydralazine 10 mg per Nephrology Reports that home readings are 120s/70s 144/85 Today  Will continue current regimen given reassuring home pressures F/u in 6 months

## 2020-05-27 NOTE — Progress Notes (Signed)
Established patient visit   Patient: Dawn Bruce   DOB: [birth year]   75 y.o. Female  MRN: [id number] Visit Date: 05/27/2020  Today's healthcare provider: Lavon Paganini, MD   Chief Complaint  Patient presents with  . Hypertension   Subjective    HPI   Anik reports that she feels well. She has noticed no side effects since start her new medications. She has had no episodes of dizziness or lightheadedness with standing. She reports 120/70s blood pressure at home.   Social History   Tobacco Use  . Smoking status: Former Smoker    Types: Cigarettes  . Smokeless tobacco: Never Used  . Tobacco comment: quit around 1990-1995 (?)  Vaping Use  . Vaping Use: Never used  Substance Use Topics  . Alcohol use: Yes    Alcohol/week: 21.0 standard drinks    Types: 21 Shots of liquor per week    Comment: 3 scotch drinks a night  . Drug use: Never       Medications: Outpatient Medications Prior to Visit  Medication Sig  . amLODipine (NORVASC) 5 MG tablet Take 1 tablet (5 mg total) by mouth daily.  Marland Kitchen aspirin 81 MG chewable tablet Chew by mouth daily.  Marland Kitchen atorvastatin (LIPITOR) 40 MG tablet Take 1 tablet (40 mg total) by mouth daily.  . Cholecalciferol (VITAMIN D3) 50 MCG (2000 UT) capsule Take 2,000 Units by mouth daily.  Marland Kitchen escitalopram (LEXAPRO) 10 MG tablet Take 1 tablet (10 mg total) by mouth daily.  . famotidine (PEPCID) 40 MG tablet Take 1 tablet (40 mg total) by mouth daily.  . hydrALAZINE (APRESOLINE) 10 MG tablet Take 1 tablet (10 mg total) by mouth 3 (three) times daily.  . pregabalin (LYRICA) 150 MG capsule Take 1 capsule (150 mg total) by mouth 2 (two) times daily.  . [DISCONTINUED] diltiazem (CARDIZEM CD) 120 MG 24 hr capsule Take 1 capsule (120 mg total) by mouth in the morning and at bedtime.  . [DISCONTINUED] fentaNYL (DURAGESIC) 50 MCG/HR Place 1 patch onto the skin every 3 (three) days.  . [DISCONTINUED] losartan (COZAAR) 50 MG tablet Take 1 tablet (50 mg  total) by mouth daily.   No facility-administered medications prior to visit.    Review of Systems  Constitutional: Negative.   HENT: Negative.   Eyes: Negative.   Respiratory: Negative.   Cardiovascular: Negative.   Gastrointestinal: Negative.   Endocrine: Negative.   Genitourinary: Negative.   Musculoskeletal: Negative.   Skin: Negative.   Allergic/Immunologic: Negative.   Neurological: Negative.   Hematological: Negative.   Psychiatric/Behavioral: Negative.       Objective    BP 125/85 Comment: home reading  Pulse 80   Temp 97.9 F (36.6 C) (Oral)   Wt 131 lb (59.4 kg)   BMI 23.96 kg/m  BP Readings from Last 3 Encounters:  05/27/20 125/85  04/20/20 (!) 190/90  10/08/19 132/78      Physical Exam   General: Well appearing in NAD, speaking in complete sentences Lungs: CTA bilaterally, Normal work of breathing Cards: RRR no murmurs rubs or gallops, no leg swelling Abdomen: Soft, Non-tender, Non-distended   No results found for any visits on 05/27/20.  Assessment & Plan     Problem List Items Addressed This Visit      Cardiovascular and Mediastinum   Essential hypertension - Primary    Previously Uncontrolled, refractory to diltiazem  Started on amlodipine 5mg  and Hydralazine 10 mg per Nephrology Reports that home readings are  120s/70s 144/85 Today  Will continue current regimen given reassuring home pressures F/u in 6 months         Return in about 6 months (around 11/24/2020) for chronic disease f/u.      Patient seen along with MS3 student [facility]. I personally evaluated this patient along with the student, and verified all aspects of the history, physical exam, and medical decision making as documented by the student. I agree with the student's documentation and have made all necessary edits.  Tonjia Parillo, Marzella Schlein, MD, MPH [facility]

## 2020-05-28 ENCOUNTER — Encounter: Payer: Self-pay | Admitting: Family Medicine

## 2020-06-03 ENCOUNTER — Encounter: Payer: Self-pay | Admitting: Family Medicine

## 2020-06-08 ENCOUNTER — Encounter: Payer: Self-pay | Admitting: Family Medicine

## 2020-06-10 MED ORDER — FENTANYL 50 MCG/HR TD PT72: 1.0000 | MEDICATED_PATCH | TRANSDERMAL | 0 refills | Status: DC

## 2020-06-25 ENCOUNTER — Other Ambulatory Visit: Payer: Self-pay | Admitting: Family Medicine

## 2020-06-25 ENCOUNTER — Encounter: Payer: Self-pay | Admitting: Family Medicine

## 2020-06-25 NOTE — Telephone Encounter (Signed)
Requested Prescriptions  Pending Prescriptions Disp Refills  . atorvastatin (LIPITOR) 40 MG tablet [Pharmacy Med Name: ATORVASTATIN 40MG  TABLETS] 90 tablet 1    Sig: TAKE 1 TABLET(40 MG) BY MOUTH DAILY     Cardiovascular:  Antilipid - Statins Failed - 06/25/2020 10:45 AM      Failed - LDL in normal range and within 360 days    LDL Chol Calc (NIH)  Date Value Ref Range Status  04/20/2020 102 (H) 0 - 99 mg/dL Final         Passed - Total Cholesterol in normal range and within 360 days    Cholesterol, Total  Date Value Ref Range Status  04/20/2020 178 100 - 199 mg/dL Final         Passed - HDL in normal range and within 360 days    HDL  Date Value Ref Range Status  04/20/2020 61 >39 mg/dL Final         Passed - Triglycerides in normal range and within 360 days    Triglycerides  Date Value Ref Range Status  04/20/2020 79 0 - 149 mg/dL Final         Passed - Patient is not pregnant      Passed - Valid encounter within last 12 months    Recent Outpatient Visits          4 weeks ago Essential hypertension   [place], Dionne Bucy, MD   2 months ago Encounter for annual physical exam   [facility] [place], Dionne Bucy, MD   8 months ago Essential hypertension   [place], Dionne Bucy, MD   1 year ago Encounter for annual physical exam   [facility] [place], Dionne Bucy, MD   1 year ago Essential hypertension   [place], Dionne Bucy, MD      Future Appointments            In 3 months Ralene Bathe, MD [place]   In 5 months Bacigal[place], Dionne Bucy, MD [facility], [place]   In 10 months Bacigalupo, Dionne Bucy, MD [facility], [place]

## 2020-06-28 MED ORDER — ESCITALOPRAM OXALATE 10 MG PO TABS: 10.0000 mg | ORAL_TABLET | Freq: Every day | ORAL | 0 refills | Status: DC

## 2020-06-28 MED ORDER — HYDRALAZINE HCL 10 MG PO TABS: 10.0000 mg | ORAL_TABLET | Freq: Three times a day (TID) | ORAL | 1 refills | Status: DC

## 2020-06-28 MED ORDER — FENTANYL 50 MCG/HR TD PT72: 1.0000 | MEDICATED_PATCH | TRANSDERMAL | 0 refills | Status: DC

## 2020-06-28 MED ORDER — ATORVASTATIN CALCIUM 40 MG PO TABS
ORAL_TABLET | ORAL | 0 refills | Status: DC
Start: 2020-06-28 — End: 2020-10-04

## 2020-06-28 MED ORDER — AMLODIPINE BESYLATE 5 MG PO TABS
5.0000 mg | ORAL_TABLET | Freq: Every day | ORAL | 0 refills | Status: DC
Start: 2020-06-28 — End: 2020-09-24

## 2020-06-28 MED ORDER — FAMOTIDINE 40 MG PO TABS: 40.0000 mg | ORAL_TABLET | Freq: Every day | ORAL | 0 refills | Status: DC

## 2020-06-28 MED ORDER — PREGABALIN 150 MG PO CAPS: 150.0000 mg | ORAL_CAPSULE | Freq: Two times a day (BID) | ORAL | 0 refills | Status: DC

## 2020-07-07 ENCOUNTER — Encounter: Payer: Self-pay | Admitting: Family Medicine

## 2020-08-06 ENCOUNTER — Encounter: Payer: Self-pay | Admitting: Family Medicine

## 2020-08-09 MED ORDER — PREGABALIN 150 MG PO CAPS: 150.0000 mg | ORAL_CAPSULE | Freq: Two times a day (BID) | ORAL | 3 refills | Status: DC

## 2020-08-18 ENCOUNTER — Encounter: Payer: Self-pay | Admitting: Family Medicine

## 2020-08-19 NOTE — Telephone Encounter (Signed)
That appt is fine

## 2020-08-30 ENCOUNTER — Ambulatory Visit: Payer: Medicare Other | Admitting: Cardiology

## 2020-08-30 ENCOUNTER — Other Ambulatory Visit: Payer: Self-pay

## 2020-08-30 ENCOUNTER — Ambulatory Visit: Payer: Medicare Other | Admitting: Family Medicine

## 2020-08-30 ENCOUNTER — Telehealth: Payer: Self-pay

## 2020-08-30 ENCOUNTER — Encounter: Payer: Self-pay | Admitting: Family Medicine

## 2020-08-30 ENCOUNTER — Encounter: Payer: Self-pay | Admitting: Cardiology

## 2020-08-30 VITALS — BP 168/90 | HR 145 | Ht 63.0 in | Wt 132.0 lb

## 2020-08-30 DIAGNOSIS — I10 Essential (primary) hypertension: Secondary | ICD-10-CM | POA: Diagnosis not present

## 2020-08-30 DIAGNOSIS — I4891 Unspecified atrial fibrillation: Secondary | ICD-10-CM | POA: Diagnosis not present

## 2020-08-30 DIAGNOSIS — E7800 Pure hypercholesterolemia, unspecified: Secondary | ICD-10-CM | POA: Diagnosis not present

## 2020-08-30 MED ORDER — ELIQUIS 5 MG PO TABS: 5.0000 mg | ORAL_TABLET | Freq: Two times a day (BID) | ORAL | 5 refills | Status: DC

## 2020-08-30 MED ORDER — METOPROLOL TARTRATE 50 MG PO TABS: 50.0000 mg | ORAL_TABLET | Freq: Two times a day (BID) | ORAL | 3 refills | Status: DC

## 2020-08-30 MED ORDER — METOPROLOL TARTRATE 12.5 MG HALF TABLET
50.0000 mg | ORAL_TABLET | Freq: Once | ORAL | Status: AC
  Administered 2020-08-30: 50 mg via ORAL

## 2020-08-30 NOTE — Patient Instructions (Signed)
Medication Instructions:   Your physician has recommended you make the following change in your medication:   1.  START taking Lopressor (Metoprolol Tartrate) 50 MG twice a day.  2.  START taking Eliquis 5 MG twice a day.  *If you need a refill on your cardiac medications before your next appointment, please call your pharmacy*   Lab Work: None ordered If you have labs (blood work) drawn today and your tests are completely normal, you will receive your results only by: Marland Kitchen MyChart Message (if you have MyChart) OR . A paper copy in the mail If you have any lab test that is abnormal or we need to change your treatment, we will call you to review the results.   Testing/Procedures:  Your physician has requested that you have an echocardiogram. Echocardiography is a painless test that uses sound waves to create images of your heart. It provides your doctor with information about the size and shape of your heart and how well your heart's chambers and valves are working. This procedure takes approximately one hour. There are no restrictions for this procedure.    Follow-Up: At [facility], you and your health needs are our priority.  As part of our continuing mission to provide you with exceptional heart care, we have created designated Provider Care Teams.  These Care Teams include your primary Cardiologist (physician) and Advanced Practice Providers (APPs -  Physician Assistants and Nurse Practitioners) who all work together to provide you with the care you need, when you need it.  We recommend signing up for the patient portal called "MyChart".  Sign up information is provided on this After Visit Summary.  MyChart is used to connect with patients for Virtual Visits (Telemedicine).  Patients are able to view lab/test results, encounter notes, upcoming appointments, etc.  Non-urgent messages can be sent to your provider as well.   To learn more about what you can do with MyChart, go to  [email or web address].    Your next appointment:   Follow up after Echo   The format for your next appointment:   In Person  Provider:   Kate Sable, MD   Other Instructions

## 2020-08-30 NOTE — Telephone Encounter (Signed)
Attempted to contact no ans no vm .

## 2020-08-30 NOTE — Progress Notes (Signed)
Cardiology Office Note:    Date:  08/30/2020   ID:  [name], DOB 12/31/1945, MRN [id number]  PCP:  Virginia Crews, MD   [place]  Cardiologist:  Kate Sable, MD  Advanced Practice Provider:  No care team member to display Electrophysiologist:  None       Referring MD: Virginia Crews, MD   Chief Complaint  Patient presents with  . New Patient (Initial Visit)    Referred by PCP for Elevated HR, BP and SOB. Meds reviewed verbally with patient.      History of Present Illness:    Dawn Bruce is a 75 y.o. female with a hx of hypertension, hyperlipidemia, former smoker who presents due to palpitations.  Patient states having on and off symptoms of palpitations over the past 2 months associated with shortness of breath.  She denies any history of heart disease.  Takes medications for blood pressure and cholesterol as prescribed.  Blood pressure is typically controlled at home with systolics in the 637C to 588F.  Denies any history of bleeding, falls, syncope.    Past Medical History:  Diagnosis Date  . Actinic keratosis   . Chronic pain   . Hyperlipidemia   . Hypertension   . Squamous cell carcinoma of skin 09/10/2019   R dorsum hand and L wrist   . Squamous cell carcinoma of skin 10/28/2019   R dorsum hand prox lateral   . Squamous cell carcinoma of skin 10/28/2019   R dorsum hand prox medial   . Squamous cell carcinoma of skin 10/28/2019   R dorsum hand distal lateral   . Squamous cell carcinoma of skin 10/28/2019   R dorsum hand distal medial   . Squamous cell carcinoma of skin 03/04/2020   SCC IS R medial infraorbital, LN2 04/28/20  . Squamous cell carcinoma of skin 03/04/2020   R dorsal hand, EDC  . Squamous cell carcinoma of skin 03/04/2020   SCC IS, L dorsum wrist, EDC    Past Surgical History:  Procedure Laterality Date  . ABDOMINAL HYSTERECTOMY    . AUGMENTATION MAMMAPLASTY Bilateral    25-30 years ago  .  CAROTID ARTERY ANGIOPLASTY Right 1995(approximate)  . CAROTID ENDARTERECTOMY  2001  . COSMETIC SURGERY    . ELBOW SURGERY    . TONSILLECTOMY      Current Medications: Current Meds  Medication Sig  . amLODipine (NORVASC) 5 MG tablet Take 1 tablet (5 mg total) by mouth daily.  Marland Kitchen apixaban (ELIQUIS) 5 MG TABS tablet Take 1 tablet (5 mg total) by mouth 2 (two) times daily.  Marland Kitchen aspirin 81 MG chewable tablet Chew by mouth daily.  Marland Kitchen atorvastatin (LIPITOR) 40 MG tablet TAKE 1 TABLET(40 MG) BY MOUTH DAILY  . Cholecalciferol (VITAMIN D3) 50 MCG (2000 UT) capsule Take 2,000 Units by mouth daily.  Marland Kitchen escitalopram (LEXAPRO) 10 MG tablet Take 1 tablet (10 mg total) by mouth daily.  . famotidine (PEPCID) 40 MG tablet Take 1 tablet (40 mg total) by mouth daily.  . fentaNYL (DURAGESIC) 50 MCG/HR Place 1 patch onto the skin every 3 (three) days.  . hydrALAZINE (APRESOLINE) 10 MG tablet Take 1 tablet (10 mg total) by mouth 3 (three) times daily.  . metoprolol tartrate (LOPRESSOR) 50 MG tablet Take 1 tablet (50 mg total) by mouth 2 (two) times daily.  . pregabalin (LYRICA) 150 MG capsule Take 1 capsule (150 mg total) by mouth 2 (two) times daily.     Allergies:  Patient has no known allergies.   Social History   Socioeconomic History  . Marital status: Married    Spouse name: Not on file  . Number of children: 3  . Years of education: Not on file  . Highest education level: Some college, no degree  Occupational History  . Occupation: retired  Tobacco Use  . Smoking status: Former Smoker    Types: Cigarettes  . Smokeless tobacco: Never Used  . Tobacco comment: quit around 1990-1995 (?)  Vaping Use  . Vaping Use: Never used  Substance and Sexual Activity  . Alcohol use: Yes    Alcohol/week: 21.0 standard drinks    Types: 21 Shots of liquor per week    Comment: 3 scotch drinks a night  . Drug use: Never  . Sexual activity: Not on file  Other Topics Concern  . Not on file  Social History  Narrative  . Not on file   Social Determinants of Health   Financial Resource Strain: Low Risk   . Difficulty of Paying Living Expenses: Not hard at all  Food Insecurity: No Food Insecurity  . Worried About Charity fundraiser in the Last Year: Never true  . Ran Out of Food in the Last Year: Never true  Transportation Needs: No Transportation Needs  . Lack of Transportation (Medical): No  . Lack of Transportation (Non-Medical): No  Physical Activity: Insufficiently Active  . Days of Exercise per Week: 7 days  . Minutes of Exercise per Session: 10 min  Stress: No Stress Concern Present  . Feeling of Stress : Not at all  Social Connections: Moderately Isolated  . Frequency of Communication with Friends and Family: More than three times a week  . Frequency of Social Gatherings with Friends and Family: Twice a week  . Attends Religious Services: Never  . Active Member of Clubs or Organizations: No  . Attends Archivist Meetings: Never  . Marital Status: Married     Family History: The patient's family history includes Cancer in her father and mother. There is no history of Breast cancer.  ROS:   Please see the history of present illness.     All other systems reviewed and are negative.  EKGs/Labs/Other Studies Reviewed:    The following studies were reviewed today:   EKG:  EKG is  ordered today.  The ekg ordered today demonstrates atrial fibrillation, with rapid ventricular response  Recent Labs: 10/08/2019: BUN 27; Creatinine, Ser 1.15; Potassium 4.6; Sodium 146 04/20/2020: ALT 12  Recent Lipid Panel    Component Value Date/Time   CHOL 178 04/20/2020 1121   TRIG 79 04/20/2020 1121   HDL 61 04/20/2020 1121   CHOLHDL 2.9 04/20/2020 1121   LDLCALC 102 (H) 04/20/2020 1121     Risk Assessment/Calculations:      Physical Exam:    VS:  BP (!) 168/90 (BP Location: Left Arm, Patient Position: Sitting, Cuff Size: Normal)   Pulse (!) 145   Ht 5\' 3"  (1.6 m)    Wt 132 lb (59.9 kg)   SpO2 97%   BMI 23.38 kg/m     Wt Readings from Last 3 Encounters:  08/30/20 132 lb (59.9 kg)  05/27/20 131 lb (59.4 kg)  04/20/20 131 lb 3.2 oz (59.5 kg)     GEN:  Well nourished, well developed in no acute distress HEENT: Normal NECK: No JVD; No carotid bruits LYMPHATICS: No lymphadenopathy CARDIAC: Irregular irregular, tachycardic, no murmurs, rubs, gallops RESPIRATORY:  Clear to  auscultation without rales, wheezing or rhonchi  ABDOMEN: Soft, non-tender, non-distended MUSCULOSKELETAL:  No edema; No deformity  SKIN: Warm and dry NEUROLOGIC:  Alert and oriented x 3 PSYCHIATRIC:  Normal affect   ASSESSMENT:    1. Atrial fibrillation with RVR (Omega)   2. Primary hypertension   3. Pure hypercholesterolemia    PLAN:    In order of problems listed above:  1. A. fib with RVR, CHA2DS2-VASc of 4.  Get echocardiogram, give metoprolol tartrate 50 mg x 1, start Lopressor 50 mg twice daily.  Start Eliquis 5 mg twice daily.  Plan for DC cardioversion if patient is still in A. fib after 3 weeks of anticoagulation.  Will consider EP referral if failed DC cardioversion, symptomatic A. Fib. 2. Hypertension, on amlodipine, Lopressor as above. 3. Hyperlipidemia, continue Lipitor.  Follow-up after echocardiogram.     Medication Adjustments/Labs and Tests Ordered: Current medicines are reviewed at length with the patient today.  Concerns regarding medicines are outlined above.  Orders Placed This Encounter  Procedures  . EKG 12-Lead  . ECHOCARDIOGRAM COMPLETE   Meds ordered this encounter  Medications  . metoprolol tartrate (LOPRESSOR) tablet 50 mg  . metoprolol tartrate (LOPRESSOR) 50 MG tablet    Sig: Take 1 tablet (50 mg total) by mouth 2 (two) times daily.    Dispense:  60 tablet    Refill:  3  . apixaban (ELIQUIS) 5 MG TABS tablet    Sig: Take 1 tablet (5 mg total) by mouth 2 (two) times daily.    Dispense:  60 tablet    Refill:  5    Patient  Instructions  Medication Instructions:   Your physician has recommended you make the following change in your medication:   1.  START taking Lopressor (Metoprolol Tartrate) 50 MG twice a day.  2.  START taking Eliquis 5 MG twice a day.  *If you need a refill on your cardiac medications before your next appointment, please call your pharmacy*   Lab Work: None ordered If you have labs (blood work) drawn today and your tests are completely normal, you will receive your results only by: Marland Kitchen MyChart Message (if you have MyChart) OR . A paper copy in the mail If you have any lab test that is abnormal or we need to change your treatment, we will call you to review the results.   Testing/Procedures:  Your physician has requested that you have an echocardiogram. Echocardiography is a painless test that uses sound waves to create images of your heart. It provides your doctor with information about the size and shape of your heart and how well your heart's chambers and valves are working. This procedure takes approximately one hour. There are no restrictions for this procedure.    Follow-Up: At [facility], you and your health needs are our priority.  As part of our continuing mission to provide you with exceptional heart care, we have created designated Provider Care Teams.  These Care Teams include your primary Cardiologist (physician) and Advanced Practice Providers (APPs -  Physician Assistants and Nurse Practitioners) who all work together to provide you with the care you need, when you need it.  We recommend signing up for the patient portal called "MyChart".  Sign up information is provided on this After Visit Summary.  MyChart is used to connect with patients for Virtual Visits (Telemedicine).  Patients are able to view lab/test results, encounter notes, upcoming appointments, etc.  Non-urgent messages can be sent to your  provider as well.   To learn more about what you can do with  MyChart, go to [email or web address].    Your next appointment:   Follow up after Echo   The format for your next appointment:   In Person  Provider:   Kate Sable, MD   Other Instructions      Signed, Kate Sable, MD  08/30/2020 5:02 PM    [place]

## 2020-08-30 NOTE — Telephone Encounter (Signed)
Per Dr. Fletcher Anon this week any provider new patient appt for BP issues.

## 2020-09-09 ENCOUNTER — Encounter: Payer: Self-pay | Admitting: Family Medicine

## 2020-09-10 MED ORDER — FENTANYL 50 MCG/HR TD PT72: 1.0000 | MEDICATED_PATCH | TRANSDERMAL | 0 refills | Status: DC

## 2020-09-16 ENCOUNTER — Ambulatory Visit: Payer: Medicare Other | Admitting: Family Medicine

## 2020-09-24 ENCOUNTER — Telehealth: Payer: Self-pay | Admitting: Cardiology

## 2020-09-24 MED ORDER — METOPROLOL TARTRATE 50 MG PO TABS: 50.0000 mg | ORAL_TABLET | Freq: Two times a day (BID) | ORAL | 0 refills | Status: DC

## 2020-09-24 MED ORDER — AMLODIPINE BESYLATE 5 MG PO TABS: 5.0000 mg | ORAL_TABLET | Freq: Every day | ORAL | 0 refills | Status: DC

## 2020-09-24 NOTE — Telephone Encounter (Signed)
Requested Prescriptions   Signed Prescriptions Disp Refills   amLODipine (NORVASC) 5 MG tablet 3 tablet 0    Sig: Take 1 tablet (5 mg total) by mouth daily.    Authorizing Provider: Kate Sable    Ordering User: Raelene Bott, Jenisis Harmsen L   metoprolol tartrate (LOPRESSOR) 50 MG tablet 6 tablet 0    Sig: Take 1 tablet (50 mg total) by mouth 2 (two) times daily.    Authorizing Provider: Kate Sable    Ordering User: Raelene Bott, Roderick Sweezy L   Spoke with patient who requested 3 days worth of these medications to be sent to [facility] because she left medications behind during vacation. She will be getting them back on Monday. Pharmacist states it will be cash pay because insurance will not refill-too soon. Made patient aware of this.

## 2020-09-24 NOTE — Telephone Encounter (Signed)
Patient calling  States she went on vacation and left her medications Would like to clarify medications and see if we can send a prescription in for a small supply Please call to discuss

## 2020-09-28 ENCOUNTER — Encounter: Payer: Self-pay | Admitting: *Deleted

## 2020-09-28 ENCOUNTER — Ambulatory Visit
Admission: RE | Admit: 2020-09-28 | Discharge: 2020-09-28 | Disposition: A | Discharge status: Home / Self Care | Payer: Medicare Other | Source: Ambulatory Visit | Attending: Cardiology | Admitting: Cardiology

## 2020-09-28 ENCOUNTER — Encounter: Payer: Self-pay | Admitting: Cardiology

## 2020-09-28 ENCOUNTER — Ambulatory Visit (INDEPENDENT_AMBULATORY_CARE_PROVIDER_SITE_OTHER): Payer: Medicare Other

## 2020-09-28 ENCOUNTER — Other Ambulatory Visit: Payer: Self-pay

## 2020-09-28 ENCOUNTER — Other Ambulatory Visit: Payer: Self-pay | Admitting: *Deleted

## 2020-09-28 DIAGNOSIS — J90 Pleural effusion, not elsewhere classified: Secondary | ICD-10-CM

## 2020-09-28 DIAGNOSIS — I4891 Unspecified atrial fibrillation: Secondary | ICD-10-CM | POA: Diagnosis not present

## 2020-09-28 DIAGNOSIS — J929 Pleural plaque without asbestos: Secondary | ICD-10-CM | POA: Diagnosis not present

## 2020-09-28 DIAGNOSIS — Z79899 Other long term (current) drug therapy: Secondary | ICD-10-CM

## 2020-09-28 DIAGNOSIS — I2510 Atherosclerotic heart disease of native coronary artery without angina pectoris: Secondary | ICD-10-CM | POA: Diagnosis not present

## 2020-09-28 DIAGNOSIS — J9811 Atelectasis: Secondary | ICD-10-CM | POA: Diagnosis not present

## 2020-09-28 LAB — ECHOCARDIOGRAM COMPLETE
AR max vel: 1.04 cm2
AV Area VTI: 0.99 cm2
AV Area mean vel: 0.97 cm2
AV Mean grad: 3 mmHg
AV Peak grad: 7 mmHg
Ao pk vel: 1.32 m/s
Area-P 1/2: 3.03 cm2
MV VTI: 0.79 cm2
S' Lateral: 2 cm

## 2020-09-28 MED ORDER — FUROSEMIDE 40 MG PO TABS: 40.0000 mg | ORAL_TABLET | Freq: Every day | ORAL | 3 refills | Status: DC

## 2020-09-28 NOTE — Progress Notes (Signed)
Chest CT 

## 2020-09-28 NOTE — Patient Instructions (Addendum)
Medication Instructions:  - Your physician has recommended you make the following change in your medication:   1) START lasix (furosemide) 40 mg- take 1 tablet by mouth once daily  *If you need a refill on your cardiac medications before your next appointment, please call your pharmacy*   Lab Work: - Your physician recommends that you return for lab work in: Monday 10/04/20- BMP - Please have this done about 1- 1.5 hour (s) prior to your appointment with Dr. Garen Lah  [facility] Entrance at [facility] 1st desk on the right to check in, past the screening table   If you have labs (blood work) drawn today and your tests are completely normal, you will receive your results only by: Marland Kitchen MyChart Message (if you have MyChart) OR . A paper copy in the mail If you have any lab test that is abnormal or we need to change your treatment, we will call you to review the results.   Testing/Procedures:  1) Chest CT: - Non-Cardiac CT scanning, (CAT scanning), is a noninvasive, special x-ray that produces cross-sectional images of the body using x-rays and a computer. CT scans help physicians diagnose and treat medical conditions. For some CT exams, a contrast material is used to enhance visibility in the area of the body being studied. CT scans provide greater clarity and reveal more details than regular x-ray exams.   This will be done TODAY - once you leave our office drive over to the [place] located off [address] [place], [place] [address] - [phone]  Follow-Up: At [facility], you and your health needs are our priority.  As part of our continuing mission to provide you with exceptional heart care, we have created designated Provider Care Teams.  These Care Teams include your primary Cardiologist (physician) and Advanced Practice Providers (APPs -  Physician Assistants and Nurse Practitioners) who all work together to provide you with  the care you need, when you need it.  We recommend signing up for the patient portal called "MyChart".  Sign up information is provided on this After Visit Summary.  MyChart is used to connect with patients for Virtual Visits (Telemedicine).  Patients are able to view lab/test results, encounter notes, upcoming appointments, etc.  Non-urgent messages can be sent to your provider as well.   To learn more about what you can do with MyChart, go to [email or web address].    Your next appointment:   As scheduled- Monday 10/04/20  The format for your next appointment:   In Person  Provider:   [place]    Other Instructions n/a

## 2020-09-28 NOTE — Progress Notes (Signed)
Patient presented today for echocardiogram due to shortness of breath.  Left-sided pleural effusion noted on echocardiogram.  Will obtain noncontrast chest CT to evaluate extent of pleural effusion.  Start Lasix 40 mg daily, check BMP in 5 days, keep follow-up appointment with myself in 6 days.  Signed, Kate Sable, M.D. 09/28/20 [place], [place]

## 2020-09-28 NOTE — Progress Notes (Unsigned)
Patient had an echocardiogram performed today showing left pleural effusion.  She states having worsening shortness of breath since last office visit.  Will obtain noncontrast chest CT to evaluate extent of pleural effusion.  Start Lasix 40 mg daily.  Keep follow-up appointment with myself in 6 days.  Discussed with patient.  Signed, Kate Sable, M.D. 09/28/20 [place], [place]

## 2020-09-29 ENCOUNTER — Telehealth: Payer: Self-pay

## 2020-09-29 DIAGNOSIS — J90 Pleural effusion, not elsewhere classified: Secondary | ICD-10-CM

## 2020-09-29 NOTE — Telephone Encounter (Signed)
Called patient and gave her the following result note from Dr. Garen Lah. Urgent referral to Pulmonary was entered. Patient verbalized understanding and agreed with plan.  Kate Sable, MD  Kavin Leech, RN Advised patient that there is bilateral pleural effusion, moderate size on the left, mild on the right. Take Lasix as prescribed today. Please refer patient to pulmonary medicine (urgent consult) for additional input regarding etiology for pleural effusion and also possible management. Thank you

## 2020-09-29 NOTE — Telephone Encounter (Signed)
-----   Message from Kate Sable, MD sent at 09/28/2020  4:46 PM EDT ----- Advised patient that there is bilateral pleural effusion, moderate size on the left, mild on the right.  Take Lasix as prescribed today.  Please refer patient to pulmonary medicine (urgent consult) for additional input regarding etiology for pleural effusion and also possible management.  Thank you

## 2020-09-30 ENCOUNTER — Ambulatory Visit: Payer: Medicare Other | Admitting: Dermatology

## 2020-10-04 ENCOUNTER — Other Ambulatory Visit: Payer: Self-pay

## 2020-10-04 ENCOUNTER — Other Ambulatory Visit
Admission: RE | Admit: 2020-10-04 | Discharge: 2020-10-04 | Disposition: A | Discharge status: Home / Self Care | Payer: Medicare Other | Attending: Cardiology | Admitting: Cardiology

## 2020-10-04 ENCOUNTER — Encounter: Payer: Self-pay | Admitting: Cardiology

## 2020-10-04 ENCOUNTER — Ambulatory Visit: Payer: Medicare Other | Admitting: Cardiology

## 2020-10-04 VITALS — BP 112/74 | HR 88 | Ht 63.0 in | Wt 125.0 lb

## 2020-10-04 DIAGNOSIS — I4819 Other persistent atrial fibrillation: Secondary | ICD-10-CM | POA: Diagnosis not present

## 2020-10-04 DIAGNOSIS — Z79899 Other long term (current) drug therapy: Secondary | ICD-10-CM | POA: Diagnosis not present

## 2020-10-04 DIAGNOSIS — I10 Essential (primary) hypertension: Secondary | ICD-10-CM

## 2020-10-04 DIAGNOSIS — J90 Pleural effusion, not elsewhere classified: Secondary | ICD-10-CM | POA: Diagnosis not present

## 2020-10-04 LAB — BASIC METABOLIC PANEL
Anion gap: 11 (ref 5–15)
BUN: 47 mg/dL — ABNORMAL HIGH (ref 8–23)
CO2: 32 mmol/L (ref 22–32)
Calcium: 9.4 mg/dL (ref 8.9–10.3)
Chloride: 95 mmol/L — ABNORMAL LOW (ref 98–111)
Creatinine, Ser: 2.71 mg/dL — ABNORMAL HIGH (ref 0.44–1.00)
GFR, Estimated: 18 mL/min — ABNORMAL LOW (ref >60)
Glucose, Bld: 121 mg/dL — ABNORMAL HIGH (ref 70–99)
Potassium: 4.6 mmol/L (ref 3.5–5.1)
Sodium: 138 mmol/L (ref 135–145)

## 2020-10-04 NOTE — Progress Notes (Signed)
Cardiology Office Note:    Date:  10/04/2020   ID:  Dawn Bruce, DOB [birth year], MRN [id number]  PCP:  Virginia Crews, MD   [place]  Cardiologist:  Kate Sable, MD  Advanced Practice Provider:  No care team member to display Electrophysiologist:  None       Referring MD: Virginia Crews, MD   Chief Complaint  Patient presents with  . Other    Follow up post ECHO. Meds reviewed verbally with patient.     History of Present Illness:    Dawn Bruce is a 75 y.o. female with a hx of paroxysmal atrial fibrillation hypertension, hyperlipidemia, former smoker who presents for follow-up.  Patient previously seen for palpitations, diagnosed with A. fib RVR, started on Lopressor and Eliquis.  Echocardiogram was ordered, which patient updated on 09/28/2020 showing preserved ejection fraction, EF 50 to 55%, moderate left pleural effusion.  Chest CT was obtained confirming moderate left pleural effusion, small on right.  She was started on Lasix, and referred to pulmonary medicine for additional input.  She now presents for results.  Prior notes Echo 09/2020, EF 50 to 78%, grade 2 diastolic dysfunction, moderate left pleural effusion.    Past Medical History:  Diagnosis Date  . Actinic keratosis   . Chronic pain   . Hyperlipidemia   . Hypertension   . Squamous cell carcinoma of skin 09/10/2019   R dorsum hand and L wrist   . Squamous cell carcinoma of skin 10/28/2019   R dorsum hand prox lateral   . Squamous cell carcinoma of skin 10/28/2019   R dorsum hand prox medial   . Squamous cell carcinoma of skin 10/28/2019   R dorsum hand distal lateral   . Squamous cell carcinoma of skin 10/28/2019   R dorsum hand distal medial   . Squamous cell carcinoma of skin 03/04/2020   SCC IS R medial infraorbital, LN2 04/28/20  . Squamous cell carcinoma of skin 03/04/2020   R dorsal hand, EDC  . Squamous cell carcinoma of skin 03/04/2020   SCC IS, L  dorsum wrist, ED[facility]    Past Surgical History:  Procedure Laterality Date  . ABDOMINAL HYSTERECTOMY    . AUGMENTATION MAMMAPLASTY Bilateral    25-30 years ago  . CAROTID ARTERY ANGIOPLASTY Right 1995(approximate)  . CAROTID ENDARTERECTOMY  2001  . COSMETIC SURGERY    . ELBOW SURGERY    . TONSILLECTOMY      Current Medications: Current Meds  Medication Sig  . amLODipine (NORVASC) 5 MG tablet Take 1 tablet (5 mg total) by mouth daily.  Marland Kitchen apixaban (ELIQUIS) 5 MG TABS tablet Take 1 tablet (5 mg total) by mouth 2 (two) times daily.  . Cholecalciferol (VITAMIN D3) 50 MCG (2000 UT) capsule Take 2,000 Units by mouth daily.  Marland Kitchen escitalopram (LEXAPRO) 10 MG tablet Take 1 tablet (10 mg total) by mouth daily.  . famotidine (PEPCID) 40 MG tablet Take 1 tablet (40 mg total) by mouth daily.  . fentaNYL (DURAGESIC) 50 MCG/HR Place 1 patch onto the skin every 3 (three) days.  . furosemide (LASIX) 40 MG tablet Take 1 tablet (40 mg total) by mouth daily.  . hydrALAZINE (APRESOLINE) 10 MG tablet Take 1 tablet (10 mg total) by mouth 3 (three) times daily.  . metoprolol tartrate (LOPRESSOR) 50 MG tablet Take 1 tablet (50 mg total) by mouth 2 (two) times daily.  . pregabalin (LYRICA) 150 MG capsule Take 1 capsule (150 mg total) by  mouth 2 (two) times daily.  . [DISCONTINUED] atorvastatin (LIPITOR) 40 MG tablet TAKE 1 TABLET(40 MG) BY MOUTH DAILY     Allergies:   Patient has no known allergies.   Social History   Socioeconomic History  . Marital status: Married    Spouse name: Not on file  . Number of children: 3  . Years of education: Not on file  . Highest education level: Some college, no degree  Occupational History  . Occupation: retired  Tobacco Use  . Smoking status: Former Smoker    Types: Cigarettes  . Smokeless tobacco: Never Used  . Tobacco comment: quit around 1990-1995 (?)  Vaping Use  . Vaping Use: Never used  Substance and Sexual Activity  . Alcohol use: Yes    Alcohol/week:  21.0 standard drinks    Types: 21 Shots of liquor per week    Comment: 3 scotch drinks a night  . Drug use: Never  . Sexual activity: Not on file  Other Topics Concern  . Not on file  Social History Narrative  . Not on file   Social Determinants of Health   Financial Resource Strain: Low Risk   . Difficulty of Paying Living Expenses: Not hard at all  Food Insecurity: No Food Insecurity  . Worried About Charity fundraiser in the Last Year: Never true  . Ran Out of Food in the Last Year: Never true  Transportation Needs: No Transportation Needs  . Lack of Transportation (Medical): No  . Lack of Transportation (Non-Medical): No  Physical Activity: Insufficiently Active  . Days of Exercise per Week: 7 days  . Minutes of Exercise per Session: 10 min  Stress: No Stress Concern Present  . Feeling of Stress : Not at all  Social Connections: Moderately Isolated  . Frequency of Communication with Friends and Family: More than three times a week  . Frequency of Social Gatherings with Friends and Family: Twice a week  . Attends Religious Services: Never  . Active Member of Clubs or Organizations: No  . Attends Archivist Meetings: Never  . Marital Status: Married     Family History: The patient's family history includes Cancer in her father and mother. There is no history of Breast cancer.  ROS:   Please see the history of present illness.     All other systems reviewed and are negative.  EKGs/Labs/Other Studies Reviewed:    The following studies were reviewed today:   EKG:  EKG not  ordered today.  Recent Labs: 04/20/2020: ALT 12 10/04/2020: BUN 47; Creatinine, Ser 2.71; Potassium 4.6; Sodium 138  Recent Lipid Panel    Component Value Date/Time   CHOL 178 04/20/2020 1121   TRIG 79 04/20/2020 1121   HDL 61 04/20/2020 1121   CHOLHDL 2.9 04/20/2020 1121   LDLCALC 102 (H) 04/20/2020 1121     Risk Assessment/Calculations:      Physical Exam:    VS:  BP  112/74 (BP Location: Left Arm, Patient Position: Sitting, Cuff Size: Normal)   Pulse 88   Ht 5\' 3"  (1.6 m)   Wt 125 lb (56.7 kg)   BMI 22.14 kg/m     Wt Readings from Last 3 Encounters:  10/04/20 125 lb (56.7 kg)  08/30/20 132 lb (59.9 kg)  05/27/20 131 lb (59.4 kg)     GEN:  Well nourished, well developed in no acute distress HEENT: Normal NECK: No JVD; No carotid bruits LYMPHATICS: No lymphadenopathy CARDIAC: Irregular irregular, no murmurs,  rubs, gallops RESPIRATORY: Clear anteriorly, decreased breath sounds at bases ABDOMEN: Soft, non-tender, non-distended MUSCULOSKELETAL:  No edema; No deformity  SKIN: Warm and dry NEUROLOGIC:  Alert and oriented x 3 PSYCHIATRIC:  Normal affect   ASSESSMENT:    1. Persistent atrial fibrillation ([place])   2. Pleural effusion   3. Primary hypertension    PLAN:    In order of problems listed above:  1. Persistent A. fib, CHA2DS2-VASc 4.  Echo with preserved ejection fraction.  Continue Lopressor, Eliquis.  Plan for DC cardioversion at follow-up visit if patient still in A. Fib. 2. Pleural effusion, moderate on left, small right.  Refer to pulmonary medicine for additional input.  Hold Lasix, creatinine worse.  Repeat BMP in 1 week. 3. Hypertension, on amlodipine, Lopressor as above.   Follow-up in 6 weeks.     Medication Adjustments/Labs and Tests Ordered: Current medicines are reviewed at length with the patient today.  Concerns regarding medicines are outlined above.  Orders Placed This Encounter  Procedures  . Basic metabolic panel   No orders of the defined types were placed in this encounter.   Patient Instructions  Medication Instructions:   Hold your Lasix until further notified.  *If you need a refill on your cardiac medications before your next appointment, please call your pharmacy*   Lab Work:  Your physician recommends that you return for lab work in:  In 1 week.  - Please go to the [facility]. You  will check in at the front desk to the right as you walk into the atrium. Valet Parking is offered if needed. - No appointment needed. You may go any day between 7 am and 6 pm.     Testing/Procedures: None ordered   Follow-Up: At [facility], you and your health needs are our priority.  As part of our continuing mission to provide you with exceptional heart care, we have created designated Provider Care Teams.  These Care Teams include your primary Cardiologist (physician) and Advanced Practice Providers (APPs -  Physician Assistants and Nurse Practitioners) who all work together to provide you with the care you need, when you need it.  We recommend signing up for the patient portal called "MyChart".  Sign up information is provided on this After Visit Summary.  MyChart is used to connect with patients for Virtual Visits (Telemedicine).  Patients are able to view lab/test results, encounter notes, upcoming appointments, etc.  Non-urgent messages can be sent to your provider as well.   To learn more about what you can do with MyChart, go to [email or web address].    Your next appointment:   6 week(s)  The format for your next appointment:   In Person  Provider:   Kate Sable, MD   Other Instructions      Signed, Kate Sable, MD  10/04/2020 12:23 PM    [place]

## 2020-10-04 NOTE — Patient Instructions (Signed)
Medication Instructions:   Hold your Lasix until further notified.  *If you need a refill on your cardiac medications before your next appointment, please call your pharmacy*   Lab Work:  Your physician recommends that you return for lab work in:  In 1 week.  - Please go to the [facility]. You will check in at the front desk to the right as you walk into the atrium. Valet Parking is offered if needed. - No appointment needed. You may go any day between 7 am and 6 pm.     Testing/Procedures: None ordered   Follow-Up: At [facility], you and your health needs are our priority.  As part of our continuing mission to provide you with exceptional heart care, we have created designated Provider Care Teams.  These Care Teams include your primary Cardiologist (physician) and Advanced Practice Providers (APPs -  Physician Assistants and Nurse Practitioners) who all work together to provide you with the care you need, when you need it.  We recommend signing up for the patient portal called "MyChart".  Sign up information is provided on this After Visit Summary.  MyChart is used to connect with patients for Virtual Visits (Telemedicine).  Patients are able to view lab/test results, encounter notes, upcoming appointments, etc.  Non-urgent messages can be sent to your provider as well.   To learn more about what you can do with MyChart, go to [email or web address].    Your next appointment:   6 week(s)  The format for your next appointment:   In Person  Provider:   Kate Sable, MD   Other Instructions

## 2020-10-06 ENCOUNTER — Other Ambulatory Visit: Payer: Self-pay | Admitting: Family Medicine

## 2020-10-06 NOTE — Telephone Encounter (Signed)
Future visit in 1 month . Last labs 01/02/2019

## 2020-10-07 ENCOUNTER — Other Ambulatory Visit: Payer: Self-pay

## 2020-10-07 MED ORDER — HYDRALAZINE HCL 10 MG PO TABS: ORAL_TABLET | ORAL | 1 refills | Status: DC

## 2020-10-13 ENCOUNTER — Other Ambulatory Visit
Admission: RE | Admit: 2020-10-13 | Discharge: 2020-10-13 | Disposition: A | Discharge status: Home / Self Care | Payer: Medicare Other | Attending: Cardiology | Admitting: Cardiology

## 2020-10-13 DIAGNOSIS — I4819 Other persistent atrial fibrillation: Secondary | ICD-10-CM | POA: Diagnosis not present

## 2020-10-13 LAB — BASIC METABOLIC PANEL
Anion gap: 10 (ref 5–15)
BUN: 28 mg/dL — ABNORMAL HIGH (ref 8–23)
CO2: 25 mmol/L (ref 22–32)
Calcium: 9.2 mg/dL (ref 8.9–10.3)
Chloride: 106 mmol/L (ref 98–111)
Creatinine, Ser: 1.41 mg/dL — ABNORMAL HIGH (ref 0.44–1.00)
GFR, Estimated: 39 mL/min — ABNORMAL LOW (ref >60)
Glucose, Bld: 98 mg/dL (ref 70–99)
Potassium: 4.4 mmol/L (ref 3.5–5.1)
Sodium: 141 mmol/L (ref 135–145)

## 2020-10-14 DIAGNOSIS — I129 Hypertensive chronic kidney disease with stage 1 through stage 4 chronic kidney disease, or unspecified chronic kidney disease: Secondary | ICD-10-CM | POA: Diagnosis not present

## 2020-10-14 DIAGNOSIS — E559 Vitamin D deficiency, unspecified: Secondary | ICD-10-CM | POA: Diagnosis not present

## 2020-10-14 DIAGNOSIS — I4891 Unspecified atrial fibrillation: Secondary | ICD-10-CM | POA: Diagnosis not present

## 2020-10-14 DIAGNOSIS — N183 Chronic kidney disease, stage 3 unspecified: Secondary | ICD-10-CM | POA: Diagnosis not present

## 2020-10-20 ENCOUNTER — Other Ambulatory Visit: Payer: Self-pay

## 2020-10-20 ENCOUNTER — Ambulatory Visit: Payer: Medicare Other | Admitting: Pulmonary Disease

## 2020-10-20 ENCOUNTER — Encounter: Payer: Self-pay | Admitting: Pulmonary Disease

## 2020-10-20 VITALS — BP 130/80 | HR 100 | Temp 98.0°F | Ht 63.0 in | Wt 131.0 lb

## 2020-10-20 DIAGNOSIS — I5032 Chronic diastolic (congestive) heart failure: Secondary | ICD-10-CM

## 2020-10-20 DIAGNOSIS — J90 Pleural effusion, not elsewhere classified: Secondary | ICD-10-CM | POA: Diagnosis not present

## 2020-10-20 DIAGNOSIS — Z7901 Long term (current) use of anticoagulants: Secondary | ICD-10-CM | POA: Diagnosis not present

## 2020-10-20 DIAGNOSIS — N1831 Chronic kidney disease, stage 3a: Secondary | ICD-10-CM

## 2020-10-20 NOTE — Patient Instructions (Signed)
Thank you for visiting Dr. Valeta Harms at [facility]. Today we recommend the following:   Plan for office-based thoracentesis on 10/28/2020 Stop Eliquis Monday evening. Will need to be off Eliquis 2 full days before thoracentesis.  Return in about 8 days (around 10/28/2020) for Dr. Valeta Harms .    Please do your part to reduce the spread of COVID-19.

## 2020-10-20 NOTE — Progress Notes (Signed)
Synopsis: Referred in June 2022 for pleural effusion by Virginia Crews, MD  Subjective:   PATIENT ID: Dawn Bruce GENDER: female DOB: [birth year], MRN: [id number]  Chief Complaint  Patient presents with  . Consult    Sob started this winter, dry cough started a month ago    This is a 75 year old female, past medical history of hyperlipidemia, hypertension, skin cancer, mother with lung cancer, father with bladder cancer.  Patient is a former smoker quit in 1995.  Patient was in [place] in April developed shortness of breath.  Came home and met with her cardiologist for evaluation.  CT scan of the chest revealed bilateral pleural effusion.  She was found to be in atrial fibrillation.  She was placed on rate control medications as well as apixaban.  Patient was referred for evaluation of bilateral pleural effusion and consideration for thoracentesis.  CT scan of the chest was completed on 09/28/2020 which revealed a moderate sized left effusion and a small right effusion.  She also has a right lung nodule.   Past Medical History:  Diagnosis Date  . Actinic keratosis   . Chronic pain   . Hyperlipidemia   . Hypertension   . Squamous cell carcinoma of skin 09/10/2019   R dorsum hand and L wrist   . Squamous cell carcinoma of skin 10/28/2019   R dorsum hand prox lateral   . Squamous cell carcinoma of skin 10/28/2019   R dorsum hand prox medial   . Squamous cell carcinoma of skin 10/28/2019   R dorsum hand distal lateral   . Squamous cell carcinoma of skin 10/28/2019   R dorsum hand distal medial   . Squamous cell carcinoma of skin 03/04/2020   SCC IS R medial infraorbital, LN2 04/28/20  . Squamous cell carcinoma of skin 03/04/2020   R dorsal hand, ED[facility]  . Squamous cell carcinoma of skin 03/04/2020   SCC IS, L dorsum wrist, ED[facility]     Family History  Problem Relation Age of Onset  . Cancer Mother   . Cancer Father   . Breast cancer Neg Hx      Past Surgical History:   Procedure Laterality Date  . ABDOMINAL HYSTERECTOMY    . AUGMENTATION MAMMAPLASTY Bilateral    25-30 years ago  . CAROTID ARTERY ANGIOPLASTY Right 1995(approximate)  . CAROTID ENDARTERECTOMY  2001  . COSMETIC SURGERY    . ELBOW SURGERY    . TONSILLECTOMY      Social History   Socioeconomic History  . Marital status: Married    Spouse name: Not on file  . Number of children: 3  . Years of education: Not on file  . Highest education level: Some college, no degree  Occupational History  . Occupation: retired  Tobacco Use  . Smoking status: Former Smoker    Packs/day: 0.50    Years: 5.00    Pack years: 2.50    Types: Cigarettes    Quit date: 1990    Years since quitting: 32.4  . Smokeless tobacco: Never Used  . Tobacco comment: quit around 1990-1995 (?)  Vaping Use  . Vaping Use: Never used  Substance and Sexual Activity  . Alcohol use: Yes    Alcohol/week: 21.0 standard drinks    Types: 21 Shots of liquor per week    Comment: 3 scotch drinks a night  . Drug use: Never  . Sexual activity: Not on file  Other Topics Concern  . Not on file  Social History  Narrative  . Not on file   Social Determinants of Health   Financial Resource Strain: Low Risk   . Difficulty of Paying Living Expenses: Not hard at all  Food Insecurity: No Food Insecurity  . Worried About Charity fundraiser in the Last Year: Never true  . Ran Out of Food in the Last Year: Never true  Transportation Needs: No Transportation Needs  . Lack of Transportation (Medical): No  . Lack of Transportation (Non-Medical): No  Physical Activity: Insufficiently Active  . Days of Exercise per Week: 7 days  . Minutes of Exercise per Session: 10 min  Stress: No Stress Concern Present  . Feeling of Stress : Not at all  Social Connections: Moderately Isolated  . Frequency of Communication with Friends and Family: More than three times a week  . Frequency of Social Gatherings with Friends and Family: Twice a  week  . Attends Religious Services: Never  . Active Member of Clubs or Organizations: No  . Attends Archivist Meetings: Never  . Marital Status: Married  Human resources officer Violence: Not At Risk  . Fear of Current or Ex-Partner: No  . Emotionally Abused: No  . Physically Abused: No  . Sexually Abused: No     No Known Allergies   Outpatient Medications Prior to Visit  Medication Sig Dispense Refill  . amLODipine (NORVASC) 5 MG tablet Take 1 tablet (5 mg total) by mouth daily. 3 tablet 0  . apixaban (ELIQUIS) 5 MG TABS tablet Take 1 tablet (5 mg total) by mouth 2 (two) times daily. 60 tablet 5  . Cholecalciferol (VITAMIN D3) 50 MCG (2000 UT) capsule Take 2,000 Units by mouth daily.    Marland Kitchen escitalopram (LEXAPRO) 10 MG tablet Take 1 tablet (10 mg total) by mouth daily. 90 tablet 0  . famotidine (PEPCID) 40 MG tablet Take 1 tablet (40 mg total) by mouth daily. 90 tablet 0  . fentaNYL (DURAGESIC) 50 MCG/HR Place 1 patch onto the skin every 3 (three) days. 10 patch 0  . metoprolol tartrate (LOPRESSOR) 50 MG tablet Take 1 tablet (50 mg total) by mouth 2 (two) times daily. 6 tablet 0  . pregabalin (LYRICA) 150 MG capsule Take 1 capsule (150 mg total) by mouth 2 (two) times daily. 180 capsule 3  . furosemide (LASIX) 40 MG tablet Take 1 tablet (40 mg total) by mouth daily. 30 tablet 3  . hydrALAZINE (APRESOLINE) 10 MG tablet TAKE 1 TABLET(10 MG) BY MOUTH THREE TIMES DAILY 270 tablet 1   No facility-administered medications prior to visit.    Review of Systems  Constitutional: Negative for chills, fever, malaise/fatigue and weight loss.  HENT: Negative for hearing loss, sore throat and tinnitus.   Eyes: Negative for blurred vision and double vision.  Respiratory: Positive for shortness of breath. Negative for cough, hemoptysis, sputum production, wheezing and stridor.   Cardiovascular: Positive for palpitations. Negative for chest pain, orthopnea, leg swelling and PND.   Gastrointestinal: Negative for abdominal pain, constipation, diarrhea, heartburn, nausea and vomiting.  Genitourinary: Negative for dysuria, hematuria and urgency.  Musculoskeletal: Negative for joint pain and myalgias.  Skin: Negative for itching and rash.  Neurological: Negative for dizziness, tingling, weakness and headaches.  Endo/Heme/Allergies: Negative for environmental allergies. Does not bruise/bleed easily.  Psychiatric/Behavioral: Negative for depression. The patient is not nervous/anxious and does not have insomnia.   All other systems reviewed and are negative.    Objective:  Physical Exam Vitals reviewed.  Constitutional:  General: She is not in acute distress.    Appearance: She is well-developed.  HENT:     Head: Normocephalic and atraumatic.  Eyes:     General: No scleral icterus.    Conjunctiva/sclera: Conjunctivae normal.     Pupils: Pupils are equal, round, and reactive to light.  Neck:     Vascular: No JVD.     Trachea: No tracheal deviation.  Cardiovascular:     Rate and Rhythm: Normal rate. Rhythm irregular.     Heart sounds: Normal heart sounds. No murmur heard.   Pulmonary:     Effort: Pulmonary effort is normal. No tachypnea, accessory muscle usage or respiratory distress.     Breath sounds: No stridor. No wheezing, rhonchi or rales.     Comments: Diminished bilateral bases Abdominal:     General: Bowel sounds are normal. There is no distension.     Palpations: Abdomen is soft.     Tenderness: There is no abdominal tenderness.  Musculoskeletal:        General: No tenderness.     Cervical back: Neck supple.  Lymphadenopathy:     Cervical: No cervical adenopathy.  Skin:    General: Skin is warm and dry.     Capillary Refill: Capillary refill takes less than 2 seconds.     Findings: No rash.  Neurological:     Mental Status: She is alert and oriented to person, place, and time.  Psychiatric:        Behavior: Behavior normal.       Vitals:   10/20/20 1406  BP: 130/80  Pulse: 100  Temp: 98 F (36.7 C)  SpO2: 96%  Weight: 131 lb (59.4 kg)  Height: 5' 3"  (1.6 m)   96% on RA BMI Readings from Last 3 Encounters:  10/20/20 23.21 kg/m  10/04/20 22.14 kg/m  08/30/20 23.38 kg/m   Wt Readings from Last 3 Encounters:  10/20/20 131 lb (59.4 kg)  10/04/20 125 lb (56.7 kg)  08/30/20 132 lb (59.9 kg)     CBC    Component Value Date/Time   WBC 6.8 01/02/2019 1402   RBC 4.95 01/02/2019 1402   HGB 14.7 01/02/2019 1402   HCT 43.9 01/02/2019 1402   PLT 179 01/02/2019 1402   MCV 89 01/02/2019 1402   MCH 29.7 01/02/2019 1402   MCHC 33.5 01/02/2019 1402   RDW 13.9 01/02/2019 1402   LYMPHSABS 1.0 01/02/2019 1402   EOSABS 0.2 01/02/2019 1402   BASOSABS 0.0 01/02/2019 1402    Chest Imaging: CT chest 09/28/2020: Moderate left-sided effusion, small right-sided effusion, right pulmonary nodule 6 mm along the oblique fissure The patient's images have been independently reviewed by me.    Pulmonary Functions Testing Results: No flowsheet data found.  FeNO:   Pathology:   Echocardiogram:   Heart Catheterization:     Assessment & Plan:     ICD-10-CM   1. Bilateral pleural effusion  J90   2. On continuous oral anticoagulation  Z79.01   3. Chronic diastolic heart failure (HCC)  I50.32   4. Stage 3a chronic kidney disease (HCC)  N18.31     Discussion:  75 year old female, bilateral pleural effusion on oral anticoagulation, Eliquis, A. fib, chronic diastolic heart failure and CKD stage III.  Plan: Today in the office we reviewed images of her pleural effusions as seen on CT. We completed bedside ultrasound and evaluated both pleural effusion. She has a large left-sided effusion and a small amount of fluid within the right  pleural space. Both appear simple under ultrasound with no evidence of loculation. We discussed the risk benefits and alternatives of proceeding with thoracentesis. Patient  would like to have fluid drained and so would cardiology prior to consideration for cardioversion. Patient is on anticoagulation which will need to be held 2 full days prior to thoracentesis. We will tentatively plan for office-based thoracentesis next week. Scheduled for Thursday of next week, last dose of Eliquis on Monday.  We appreciate the consultation.    Current Outpatient Medications:  .  amLODipine (NORVASC) 5 MG tablet, Take 1 tablet (5 mg total) by mouth daily., Disp: 3 tablet, Rfl: 0 .  apixaban (ELIQUIS) 5 MG TABS tablet, Take 1 tablet (5 mg total) by mouth 2 (two) times daily., Disp: 60 tablet, Rfl: 5 .  Cholecalciferol (VITAMIN D3) 50 MCG (2000 UT) capsule, Take 2,000 Units by mouth daily., Disp: , Rfl:  .  escitalopram (LEXAPRO) 10 MG tablet, Take 1 tablet (10 mg total) by mouth daily., Disp: 90 tablet, Rfl: 0 .  famotidine (PEPCID) 40 MG tablet, Take 1 tablet (40 mg total) by mouth daily., Disp: 90 tablet, Rfl: 0 .  fentaNYL (DURAGESIC) 50 MCG/HR, Place 1 patch onto the skin every 3 (three) days., Disp: 10 patch, Rfl: 0 .  metoprolol tartrate (LOPRESSOR) 50 MG tablet, Take 1 tablet (50 mg total) by mouth 2 (two) times daily., Disp: 6 tablet, Rfl: 0 .  pregabalin (LYRICA) 150 MG capsule, Take 1 capsule (150 mg total) by mouth 2 (two) times daily., Disp: 180 capsule, Rfl: 3   Garner Nash, DO [facility] 10/20/2020 2:14 PM

## 2020-10-21 ENCOUNTER — Encounter: Payer: Self-pay | Admitting: Family Medicine

## 2020-10-21 MED ORDER — AMLODIPINE BESYLATE 5 MG PO TABS: 5.0000 mg | ORAL_TABLET | Freq: Every day | ORAL | 1 refills | Status: DC

## 2020-10-25 NOTE — Telephone Encounter (Signed)
Dr. Valeta Harms, This message was received this morning.   I'm very nervous about procedure on the 9th.  Can you prescribe something to calm me down?  Pharmacy is [facility] on [address]

## 2020-10-28 ENCOUNTER — Ambulatory Visit (INDEPENDENT_AMBULATORY_CARE_PROVIDER_SITE_OTHER): Payer: Medicare Other

## 2020-10-28 ENCOUNTER — Encounter: Payer: Self-pay | Admitting: Pulmonary Disease

## 2020-10-28 ENCOUNTER — Other Ambulatory Visit: Payer: Self-pay

## 2020-10-28 ENCOUNTER — Other Ambulatory Visit (HOSPITAL_COMMUNITY)
Admission: RE | Admit: 2020-10-28 | Discharge: 2020-10-28 | Disposition: A | Discharge status: Home / Self Care | Payer: Medicare Other | Source: Ambulatory Visit | Attending: Pulmonary Disease | Admitting: Pulmonary Disease

## 2020-10-28 ENCOUNTER — Ambulatory Visit: Payer: Medicare Other | Admitting: Pulmonary Disease

## 2020-10-28 VITALS — BP 126/70 | HR 88 | Temp 97.5°F | Ht 63.0 in | Wt 129.1 lb

## 2020-10-28 DIAGNOSIS — J9811 Atelectasis: Secondary | ICD-10-CM | POA: Diagnosis not present

## 2020-10-28 DIAGNOSIS — J90 Pleural effusion, not elsewhere classified: Secondary | ICD-10-CM

## 2020-10-28 DIAGNOSIS — R091 Pleurisy: Secondary | ICD-10-CM | POA: Diagnosis not present

## 2020-10-28 DIAGNOSIS — N1831 Chronic kidney disease, stage 3a: Secondary | ICD-10-CM

## 2020-10-28 DIAGNOSIS — I5032 Chronic diastolic (congestive) heart failure: Secondary | ICD-10-CM | POA: Diagnosis not present

## 2020-10-28 DIAGNOSIS — Z7901 Long term (current) use of anticoagulants: Secondary | ICD-10-CM | POA: Diagnosis not present

## 2020-10-28 LAB — BODY FLUID CELL COUNT WITH DIFFERENTIAL
Eos, Fluid: 0 %
Lymphs, Fluid: 75 %
Monocyte-Macrophage-Serous Fluid: 12 % — ABNORMAL LOW (ref 50–90)
Neutrophil Count, Fluid: 13 % (ref 0–25)
Total Nucleated Cell Count, Fluid: 1302 cu mm — ABNORMAL HIGH (ref 0–1000)

## 2020-10-28 NOTE — Progress Notes (Signed)
Synopsis: Referred in June 2022 for pleural effusion by Virginia Crews, MD  Subjective:   PATIENT ID: Dawn Bruce GENDER: female DOB: 1945/10/11, MRN: [id number]  Chief Complaint  Patient presents with   Follow-up    Thoracentesis today.       This is a 75 year old female, past medical history of hyperlipidemia, hypertension, skin cancer, mother with lung cancer, father with bladder cancer.  Patient is a former smoker quit in 1995.  Patient was in [place] in April developed shortness of breath.  Came home and met with her cardiologist for evaluation.  CT scan of the chest revealed bilateral pleural effusion.  She was found to be in atrial fibrillation.  She was placed on rate control medications as well as apixaban.  Patient was referred for evaluation of bilateral pleural effusion and consideration for thoracentesis.  CT scan of the chest was completed on 09/28/2020 which revealed a moderate sized left effusion and a small right effusion.  She also has a right lung nodule.  OV 10/28/2020: Here today for planned office-based thoracentesis.  The patient has held her anticoagulation.Please see signed consent.  We discussed the risk benefits and alternatives before proceeding with thoracentesis today.    Past Medical History:  Diagnosis Date   Actinic keratosis    Chronic pain    Hyperlipidemia    Hypertension    Squamous cell carcinoma of skin 09/10/2019   R dorsum hand and L wrist    Squamous cell carcinoma of skin 10/28/2019   R dorsum hand prox lateral    Squamous cell carcinoma of skin 10/28/2019   R dorsum hand prox medial    Squamous cell carcinoma of skin 10/28/2019   R dorsum hand distal lateral    Squamous cell carcinoma of skin 10/28/2019   R dorsum hand distal medial    Squamous cell carcinoma of skin 03/04/2020   SCC IS R medial infraorbital, LN2 04/28/20   Squamous cell carcinoma of skin 03/04/2020   R dorsal hand, EDC   Squamous cell carcinoma of skin  03/04/2020   SCC IS, L dorsum wrist, EDC     Family History  Problem Relation Age of Onset   Cancer Mother    Cancer Father    Breast cancer Neg Hx      Past Surgical History:  Procedure Laterality Date   ABDOMINAL HYSTERECTOMY     AUGMENTATION MAMMAPLASTY Bilateral    25-30 years ago   CAROTID ARTERY ANGIOPLASTY Right 1995(approximate)   CAROTID ENDARTERECTOMY  2001   COSMETIC SURGERY     ELBOW SURGERY     TONSILLECTOMY      Social History   Socioeconomic History   Marital status: Married    Spouse name: Not on file   Number of children: 3   Years of education: Not on file   Highest education level: Some college, no degree  Occupational History   Occupation: retired  Tobacco Use   Smoking status: Former    Packs/day: 0.50    Years: 5.00    Pack years: 2.50    Types: Cigarettes    Quit date: 1990    Years since quitting: 32.4   Smokeless tobacco: Never   Tobacco comments:    quit around 1990-1995 (?)  Vaping Use   Vaping Use: Never used  Substance and Sexual Activity   Alcohol use: Yes    Alcohol/week: 21.0 standard drinks    Types: 21 Shots of liquor per week    Comment:  3 scotch drinks a night   Drug use: Never   Sexual activity: Not on file  Other Topics Concern   Not on file  Social History Narrative   Not on file   Social Determinants of Health   Financial Resource Strain: Low Risk    Difficulty of Paying Living Expenses: Not hard at all  Food Insecurity: No Food Insecurity   Worried About Running Out of Food in the Last Year: Never true   [place] in the Last Year: Never true  Transportation Needs: No Transportation Needs   Lack of Transportation (Medical): No   Lack of Transportation (Non-Medical): No  Physical Activity: Insufficiently Active   Days of Exercise per Week: 7 days   Minutes of Exercise per Session: 10 min  Stress: No Stress Concern Present   Feeling of Stress : Not at all  Social Connections: Moderately Isolated    Frequency of Communication with Friends and Family: More than three times a week   Frequency of Social Gatherings with Friends and Family: Twice a week   Attends Religious Services: Never   Marine scientist or Organizations: No   Attends Music therapist: Never   Marital Status: Married  Human resources officer Violence: Not At Risk   Fear of Current or Ex-Partner: No   Emotionally Abused: No   Physically Abused: No   Sexually Abused: No     No Known Allergies   Outpatient Medications Prior to Visit  Medication Sig Dispense Refill   amLODipine (NORVASC) 5 MG tablet Take 1 tablet (5 mg total) by mouth daily. 90 tablet 1   apixaban (ELIQUIS) 5 MG TABS tablet Take 1 tablet (5 mg total) by mouth 2 (two) times daily. 60 tablet 5   Cholecalciferol (VITAMIN D3) 50 MCG (2000 UT) capsule Take 2,000 Units by mouth daily.     escitalopram (LEXAPRO) 10 MG tablet Take 1 tablet (10 mg total) by mouth daily. 90 tablet 0   famotidine (PEPCID) 40 MG tablet Take 1 tablet (40 mg total) by mouth daily. 90 tablet 0   fentaNYL (DURAGESIC) 50 MCG/HR Place 1 patch onto the skin every 3 (three) days. 10 patch 0   metoprolol tartrate (LOPRESSOR) 50 MG tablet Take 1 tablet (50 mg total) by mouth 2 (two) times daily. 6 tablet 0   pregabalin (LYRICA) 150 MG capsule Take 1 capsule (150 mg total) by mouth 2 (two) times daily. 180 capsule 3   No facility-administered medications prior to visit.    Review of Systems  Constitutional:  Negative for chills, fever, malaise/fatigue and weight loss.  HENT:  Negative for hearing loss, sore throat and tinnitus.   Eyes:  Negative for blurred vision and double vision.  Respiratory:  Positive for shortness of breath. Negative for cough, hemoptysis, sputum production, wheezing and stridor.   Cardiovascular:  Positive for palpitations. Negative for chest pain, orthopnea, leg swelling and PND.  Gastrointestinal:  Negative for abdominal pain, constipation, diarrhea,  heartburn, nausea and vomiting.  Genitourinary:  Negative for dysuria, hematuria and urgency.  Musculoskeletal:  Negative for joint pain and myalgias.  Skin:  Negative for itching and rash.  Neurological:  Negative for dizziness, tingling, weakness and headaches.  Endo/Heme/Allergies:  Negative for environmental allergies. Does not bruise/bleed easily.  Psychiatric/Behavioral:  Negative for depression. The patient is nervous/anxious. The patient does not have insomnia.   All other systems reviewed and are negative.   Objective:  Physical Exam Vitals reviewed.  Constitutional:  General: She is not in acute distress.    Appearance: She is well-developed.  HENT:     Head: Normocephalic and atraumatic.  Eyes:     General: No scleral icterus.    Conjunctiva/sclera: Conjunctivae normal.     Pupils: Pupils are equal, round, and reactive to light.  Neck:     Vascular: No JVD.     Trachea: No tracheal deviation.  Cardiovascular:     Rate and Rhythm: Normal rate and regular rhythm.     Heart sounds: Normal heart sounds. No murmur heard. Pulmonary:     Effort: Pulmonary effort is normal. No tachypnea, accessory muscle usage or respiratory distress.     Breath sounds: No stridor. No wheezing, rhonchi or rales.     Comments: Absent breath sounds left base Abdominal:     General: Bowel sounds are normal. There is no distension.     Palpations: Abdomen is soft.     Tenderness: There is no abdominal tenderness.  Musculoskeletal:        General: No tenderness.     Cervical back: Neck supple.  Lymphadenopathy:     Cervical: No cervical adenopathy.  Skin:    General: Skin is warm and dry.     Capillary Refill: Capillary refill takes less than 2 seconds.     Coloration: Skin is not pale.     Findings: No rash.  Neurological:     Mental Status: She is alert and oriented to person, place, and time.  Psychiatric:        Behavior: Behavior normal.     Vitals:   10/28/20 1505  BP:  126/70  Pulse: 88  Temp: (!) 97.5 F (36.4 C)  TempSrc: Tympanic  SpO2: 94%  Weight: 129 lb 2 oz (58.6 kg)  Height: _0  (1.6 m)    94% on RA BMI Readings from Last 3 Encounters:  10/28/20 22.87 kg/m  10/20/20 23.21 kg/m  10/04/20 22.14 kg/m   Wt Readings from Last 3 Encounters:  10/28/20 129 lb 2 oz (58.6 kg)  10/20/20 131 lb (59.4 kg)  10/04/20 125 lb (56.7 kg)     CBC    Component Value Date/Time   WBC 6.8 01/02/2019 1402   RBC 4.95 01/02/2019 1402   HGB 14.7 01/02/2019 1402   HCT 43.9 01/02/2019 1402   PLT 179 01/02/2019 1402   MCV 89 01/02/2019 1402   MCH 29.7 01/02/2019 1402   MCHC 33.5 01/02/2019 1402   RDW 13.9 01/02/2019 1402   LYMPHSABS 1.0 01/02/2019 1402   EOSABS 0.2 01/02/2019 1402   BASOSABS 0.0 01/02/2019 1402    Chest Imaging: CT chest 09/28/2020: Moderate left-sided effusion, small right-sided effusion, right pulmonary nodule 6 mm along the oblique fissure The patient's images have been independently reviewed by me.    Pulmonary Functions Testing Results: No flowsheet data found.  FeNO:   Pathology:   Echocardiogram:   Heart Catheterization:     Assessment & Plan:     ICD-10-CM   1. Bilateral pleural effusion  J90     2. On continuous oral anticoagulation  Z79.01     3. Chronic diastolic heart failure (HCC)  I50.32     4. Stage 3a chronic kidney disease (HCC)  N18.31        Discussion:  75 year old female, bilateral effusion, small effusion on the right, large effusion on the left, oral anticoagulation on Eliquis for atrial fibrillation, chronic diastolic heart failure and CKD 3.  Plan: Today in the  office we discussed the risk benefits and alternatives of proceeding with left-sided thoracentesis. Patient has held her anticoagulation for the past 2 days. Patient has freely signed consent. Please see separate procedure note regarding thoracentesis  Chest x-ray reviewed following with no evidence of  pneumothorax.  Patient tolerated procedure well. We appreciate the consultation please do not hesitate to call with any questions.  Patient was instructed if she develops chest pain shortness of breath this afternoon or overnight that she is to call our on-call number or go to the emergency department for further evaluation.  Patient understands these recommendations.  Please see separate procedure orders for fluid analysis. Pleural fluid be sent for routine studies.  Orders Placed This Encounter  Procedures   DG Chest 2 View    Standing Status:   Future    Number of Occurrences:   1    Standing Expiration Date:   10/28/2021    Order Specific Question:   Reason for Exam (SYMPTOM  OR DIAGNOSIS REQUIRED)    Answer:   pleural effusion    Order Specific Question:   Preferred imaging location?    Answer:   Internal   Body fluid cell count with differential    Standing Status:   Future    Number of Occurrences:   1    Standing Expiration Date:   10/28/2021   Albumin    Standing Status:   Future    Number of Occurrences:   1    Standing Expiration Date:   10/28/2021   Glucose    Standing Status:   Future    Number of Occurrences:   1    Standing Expiration Date:   10/28/2021   Protein, total    Standing Status:   Future    Number of Occurrences:   1    Standing Expiration Date:   10/28/2021   Lactate Dehydrogenase (LDH)    Standing Status:   Future    Number of Occurrences:   1    Standing Expiration Date:   10/28/2021       Current Outpatient Medications:    amLODipine (NORVASC) 5 MG tablet, Take 1 tablet (5 mg total) by mouth daily., Disp: 90 tablet, Rfl: 1   apixaban (ELIQUIS) 5 MG TABS tablet, Take 1 tablet (5 mg total) by mouth 2 (two) times daily., Disp: 60 tablet, Rfl: 5   Cholecalciferol (VITAMIN D3) 50 MCG (2000 UT) capsule, Take 2,000 Units by mouth daily., Disp: , Rfl:    escitalopram (LEXAPRO) 10 MG tablet, Take 1 tablet (10 mg total) by mouth daily., Disp: 90 tablet, Rfl:  0   famotidine (PEPCID) 40 MG tablet, Take 1 tablet (40 mg total) by mouth daily., Disp: 90 tablet, Rfl: 0   fentaNYL (DURAGESIC) 50 MCG/HR, Place 1 patch onto the skin every 3 (three) days., Disp: 10 patch, Rfl: 0   metoprolol tartrate (LOPRESSOR) 50 MG tablet, Take 1 tablet (50 mg total) by mouth 2 (two) times daily., Disp: 6 tablet, Rfl: 0   pregabalin (LYRICA) 150 MG capsule, Take 1 capsule (150 mg total) by mouth 2 (two) times daily., Disp: 180 capsule, Rfl: 3   I spent 31 minutes dedicated to the care of this patient on the date of this encounter to include pre-visit review of records, face-to-face time with the patient discussing conditions above, post visit ordering of testing, clinical documentation with the electronic health record, making appropriate referrals as documented, and communicating necessary findings to members of the patients  care team.    Garner Nash, DO [facility] 10/28/2020 3:48 PM

## 2020-10-28 NOTE — Progress Notes (Signed)
Thoracentesis  Procedure Note  Jodee Wagenaar  [id number]  January 07, 1946  Date:10/28/20  Time:3:49 PM   Provider Performing:Ege Muckey L Calisha Tindel   Procedure: Thoracentesis with imaging guidance (80998)  Indication(s) Pleural Effusion  Consent Risks of the procedure as well as the alternatives and risks of each were explained to the patient and/or caregiver.  Consent for the procedure was obtained and is signed in the bedside chart  Anesthesia Topical only with 1% lidocaine    Time Out Verified patient identification, verified procedure, site/side was marked, verified correct patient position, special equipment/implants available, medications/allergies/relevant history reviewed, required imaging and test results available.   Sterile Technique Maximal sterile technique including full sterile barrier drape, hand hygiene, sterile gown, sterile gloves, mask, hair covering, sterile ultrasound probe cover (if used).  Procedure Description Ultrasound was used to identify appropriate pleural anatomy for placement and overlying skin marked.  Area of drainage cleaned and draped in sterile fashion. Lidocaine was used to anesthetize the skin and subcutaneous tissue.  950 cc's of clear yellow appearing fluid was drained from the left pleural space. Catheter then removed and bandaid applied to site.   Complications/Tolerance None; patient tolerated the procedure well. Chest X-ray is ordered to confirm no post-procedural complication.   EBL Minimal   Specimen(s) Pleural fluid     Garner Nash, DO [facility] 10/28/2020 3:50 PM

## 2020-10-28 NOTE — Patient Instructions (Signed)
Thank you for visiting Dr. Valeta Harms at [facility]. Today we recommend the following:  Orders Placed This Encounter  Procedures   DG Chest 2 View   Body fluid cell count with differential   Albumin   Glucose   Protein, total   Lactate Dehydrogenase (LDH)   Protein, total   Lactate Dehydrogenase (LDH)    Return if symptoms worsen or fail to improve.    Please do your part to reduce the spread of COVID-19.

## 2020-10-29 ENCOUNTER — Telehealth: Payer: Self-pay | Admitting: Pulmonary Disease

## 2020-10-29 LAB — PATHOLOGIST SMEAR REVIEW

## 2020-10-29 NOTE — Addendum Note (Signed)
Addended by: Vanessa Barbara on: 10/29/2020 11:51 AM   Modules accepted: Orders

## 2020-10-29 NOTE — Telephone Encounter (Signed)
Called and spoke with Dawn Bruce with Cytology at the hospital.  She wanted to verify that the order for Cytology on the pleural fluid needed to be discontinued.  Advised that I would check into it and call her back.  Looks like the order was cancelled because the lab had been released by our lab technician and then not reordered.  Lab reordered.  Called and spoke with Dawn Bruce, advised order had been put in, however it printed out.  She states that she sees where it was put in and then cancelled.  She is wondering if there is a Emergency planning/management officer in [place] since the update.  She said she would begin processing the fluid and speak with her supervisor and see what needs to be done.  She advised that her supervisor may contact her office as well.  I will leave open in case we hear back from her supervisor and need to document further.

## 2020-10-29 NOTE — Telephone Encounter (Signed)
See heather telephone note on lab

## 2020-10-29 NOTE — Telephone Encounter (Signed)
What does that mean in layman's terms? Referring to CXR and no pneumothorax after procedure and what the labs ordered on the fluid. Thank you  Dr. Valeta Harms, please advise on mychart message. Thanks!

## 2020-10-31 NOTE — Telephone Encounter (Signed)
Dr. Valeta Harms please advise on the pathology report.  Thanks   What does that mean in English?

## 2020-11-01 NOTE — Telephone Encounter (Signed)
Called Cytology and spoke with Varney Biles [facility] does not come in until 11 am today).  She states that the specimen was in and should go to a pathologist today, unsure when the result will be available, but he results will to to Dr. Valeta Harms and he will be able to see the results in [place].  Nothing further needed.  Closing encounter.

## 2020-11-02 LAB — CYTOLOGY - NON PAP

## 2020-11-02 NOTE — Telephone Encounter (Signed)
Good morning Dr. Valeta Harms, I called the pathology lab on Monday or Tuesday and I routed the note to you.  The pathologist was reviewing the fluid sample and the lab should be resulted so you can view it.  I hope that helps.  Thanks, Mikaella Escalona.

## 2020-11-11 ENCOUNTER — Encounter: Payer: Self-pay | Admitting: *Deleted

## 2020-11-11 NOTE — Progress Notes (Signed)
ATC, NA and VM was full

## 2020-11-15 ENCOUNTER — Encounter: Payer: Self-pay | Admitting: Cardiology

## 2020-11-15 ENCOUNTER — Ambulatory Visit: Payer: Medicare Other | Admitting: Cardiology

## 2020-11-15 ENCOUNTER — Other Ambulatory Visit: Payer: Self-pay

## 2020-11-15 VITALS — BP 126/72 | HR 84 | Ht 63.0 in | Wt 128.0 lb

## 2020-11-15 DIAGNOSIS — I10 Essential (primary) hypertension: Secondary | ICD-10-CM

## 2020-11-15 DIAGNOSIS — J90 Pleural effusion, not elsewhere classified: Secondary | ICD-10-CM | POA: Diagnosis not present

## 2020-11-15 DIAGNOSIS — I4819 Other persistent atrial fibrillation: Secondary | ICD-10-CM | POA: Diagnosis not present

## 2020-11-15 NOTE — Progress Notes (Signed)
Cardiology Office Note:    Date:  11/15/2020   ID:  Dawn Bruce, DOB 09-03-1945, MRN [id number]  PCP:  Dawn Crews, MD   [facility] [facility]  Cardiologist:  Dawn Sable, MD  Advanced Practice Provider:  No care team member to display Electrophysiologist:  None       Referring MD: Dawn Crews, MD   No chief complaint on file.   History of Present Illness:    Dawn Bruce is a 75 y.o. female with a hx of persistent atrial fibrillation, hypertension, hyperlipidemia, former smoker, pleural effusion worse on the left s/p thoracenteses 10/2020 who presents for follow-up.    Patient is being seen for persistent atrial fibrillation, and shortness of breath.  Echo did show left-sided effusion, this was confirmed on chest CT.  Was referred to pulmonary medicine, thoracentesis was eventually performed 10/28/2020, 950 cc of fluid removed.  Lasix was stopped due to worsening creatinine, follow-up creatinine showed improvement in renal function.  She felt better after thoracentesis for a week or 2, shortness of breath return after.  Denies palpitations.  Has been on Eliquis since her thoracentesis on 10/28/2020.   Prior notes Echo 09/2020, EF 50 to 10%, grade 2 diastolic dysfunction, moderate left pleural effusion.    Past Medical History:  Diagnosis Date   Actinic keratosis    Chronic pain    Hyperlipidemia    Hypertension    Squamous cell carcinoma of skin 09/10/2019   R dorsum hand and L wrist    Squamous cell carcinoma of skin 10/28/2019   R dorsum hand prox lateral    Squamous cell carcinoma of skin 10/28/2019   R dorsum hand prox medial    Squamous cell carcinoma of skin 10/28/2019   R dorsum hand distal lateral    Squamous cell carcinoma of skin 10/28/2019   R dorsum hand distal medial    Squamous cell carcinoma of skin 03/04/2020   SCC IS R medial infraorbital, LN2 04/28/20   Squamous cell carcinoma of skin 03/04/2020   R dorsal hand, EDC    Squamous cell carcinoma of skin 03/04/2020   SCC IS, L dorsum wrist, EDC    Past Surgical History:  Procedure Laterality Date   ABDOMINAL HYSTERECTOMY     AUGMENTATION MAMMAPLASTY Bilateral    25-30 years ago   CAROTID ARTERY ANGIOPLASTY Right 1995(approximate)   CAROTID ENDARTERECTOMY  2001   COSMETIC SURGERY     ELBOW SURGERY     TONSILLECTOMY      Current Medications: Current Meds  Medication Sig   amLODipine (NORVASC) 5 MG tablet Take 1 tablet (5 mg total) by mouth daily.   apixaban (ELIQUIS) 5 MG TABS tablet Take 1 tablet (5 mg total) by mouth 2 (two) times daily.   atorvastatin (LIPITOR) 40 MG tablet Take 40 mg by mouth daily.   Cholecalciferol (VITAMIN D3) 50 MCG (2000 UT) capsule Take 2,000 Units by mouth daily.   escitalopram (LEXAPRO) 10 MG tablet Take 1 tablet (10 mg total) by mouth daily.   famotidine (PEPCID) 40 MG tablet Take 1 tablet (40 mg total) by mouth daily.   fentaNYL (DURAGESIC) 50 MCG/HR Place 1 patch onto the skin every 3 (three) days.   metoprolol tartrate (LOPRESSOR) 50 MG tablet Take 1 tablet (50 mg total) by mouth 2 (two) times daily.   pregabalin (LYRICA) 150 MG capsule Take 1 capsule (150 mg total) by mouth 2 (two) times daily.     Allergies:   Patient  has no known allergies.   Social History   Socioeconomic History   Marital status: Married    Spouse name: Not on file   Number of children: 3   Years of education: Not on file   Highest education level: Some college, no degree  Occupational History   Occupation: retired  Tobacco Use   Smoking status: Former    Packs/day: 0.50    Years: 5.00    Pack years: 2.50    Types: Cigarettes    Quit date: 1990    Years since quitting: 32.5   Smokeless tobacco: Never   Tobacco comments:    quit around 1990-1995 (?)  Vaping Use   Vaping Use: Never used  Substance and Sexual Activity   Alcohol use: Yes    Alcohol/week: 21.0 standard drinks    Types: 21 Shots of liquor per week    Comment: 3  scotch drinks a night   Drug use: Never   Sexual activity: Not on file  Other Topics Concern   Not on file  Social History Narrative   Not on file   Social Determinants of Health   Financial Resource Strain: Low Risk    Difficulty of Paying Living Expenses: Not hard at all  Food Insecurity: No Food Insecurity   Worried About Charity fundraiser in the Last Year: Never true   [place] in the Last Year: Never true  Transportation Needs: No Transportation Needs   Lack of Transportation (Medical): No   Lack of Transportation (Non-Medical): No  Physical Activity: Insufficiently Active   Days of Exercise per Week: 7 days   Minutes of Exercise per Session: 10 min  Stress: No Stress Concern Present   Feeling of Stress : Not at all  Social Connections: Moderately Isolated   Frequency of Communication with Friends and Family: More than three times a week   Frequency of Social Gatherings with Friends and Family: Twice a week   Attends Religious Services: Never   Marine scientist or Organizations: No   Attends Music therapist: Never   Marital Status: Married     Family History: The patient's family history includes Cancer in her father and mother. There is no history of Breast cancer.  ROS:   Please see the history of present illness.     All other systems reviewed and are negative.  EKGs/Labs/Other Studies Reviewed:    The following studies were reviewed today:   EKG:  EKG not  ordered today.  Recent Labs: 04/20/2020: ALT 12 10/13/2020: BUN 28; Creatinine, Ser 1.41; Potassium 4.4; Sodium 141  Recent Lipid Panel    Component Value Date/Time   CHOL 178 04/20/2020 1121   TRIG 79 04/20/2020 1121   HDL 61 04/20/2020 1121   CHOLHDL 2.9 04/20/2020 1121   LDLCALC 102 (H) 04/20/2020 1121     Risk Assessment/Calculations:      Physical Exam:    VS:  BP 126/72 (BP Location: Left Arm, Patient Position: Sitting, Cuff Size: Normal)   Pulse 84   Ht  5\' 3"  (1.6 m)   Wt 128 lb (58.1 kg)   SpO2 90%   BMI 22.67 kg/m     Wt Readings from Last 3 Encounters:  11/15/20 128 lb (58.1 kg)  10/28/20 129 lb 2 oz (58.6 kg)  10/20/20 131 lb (59.4 kg)     GEN:  Well nourished, well developed in no acute distress HEENT: Normal NECK: No JVD; No carotid bruits  LYMPHATICS: No lymphadenopathy CARDIAC: Irregular irregular, no murmurs, rubs, gallops RESPIRATORY: Clear anteriorly, decreased breath sounds at bases, worse on left lower base ABDOMEN: Soft, non-tender, non-distended MUSCULOSKELETAL:  No edema; No deformity  SKIN: Warm and dry NEUROLOGIC:  Alert and oriented x 3 PSYCHIATRIC:  Normal affect   ASSESSMENT:    1. Persistent atrial fibrillation ([place])   2. Pleural effusion   3. Primary hypertension     PLAN:    In order of problems listed above:  Persistent A. fib, CHA2DS2-VASc 4 (age, htn, gender).  Echo with preserved ejection fraction.  Continue Lopressor, Eliquis.  Plan for DC cardioversion in 2 weeks. Pleural effusion, moderate on left, small right.  S/p thoracentesis 10/2020, 950 cc of fluid removed.  Decreased breath sounds on left lower base, possible fluid recurrence.  Follow-up with pulmonary medicine regarding fluid analysis, cytology.  I do not believe pleural effusion is consistent with cardiac etiology.  Typically edema from cardiac etiology is a call bilaterally.  Patient's effusion is worse on the left, at least moderate to severe, while right pleural effusion is small/mild.  Plan for DC cardioversion as above to manage A. fib. Hypertension, BP controlled, continue amlodipine, Lopressor .   Follow-up after DC cardioversion   Shared Decision Making/Informed Consent The risks (stroke, cardiac arrhythmias rarely resulting in the need for a temporary or permanent pacemaker, skin irritation or burns and complications associated with conscious sedation including aspiration, arrhythmia, respiratory failure and death), benefits  (restoration of normal sinus rhythm) and alternatives of a direct current cardioversion were explained in detail to Dawn Bruce and she agrees to proceed.     Medication Adjustments/Labs and Tests Ordered: Current medicines are reviewed at length with the patient today.  Concerns regarding medicines are outlined above.  Orders Placed This Encounter  Procedures   CBC   Basic metabolic panel   EKG 36-OQHU    No orders of the defined types were placed in this encounter.   Patient Instructions  Medication Instructions:  Your physician recommends that you continue on your current medications as directed. Please refer to the Current Medication list given to you today.  *If you need a refill on your cardiac medications before your next appointment, please call your pharmacy*   Lab Work:  Your physician recommends that you return for lab work in: any day next week  - Please go to the [facility]. You will check in at the front desk to the right as you walk into the atrium. Valet Parking is offered if needed. - No appointment needed. You may go any day between 7 am and 6 pm.    Testing/Procedures:   You are scheduled for a Cardioversion on __Wednesday 7/13/22__ with Dr._Agbor-Etang__________ Please arrive at the [place] [facility] at _________ a.m. on the day of your procedure.  DIET INSTRUCTIONS:  Nothing to eat or drink after midnight except your medications with a sip of water.         Labs: ___See Lab Instructions above_______________  Medications:  YOU MAY TAKE ALL of your remaining medications with a small amount of water.  Must have a responsible person to drive you home.  Bring a current list of your medications and current insurance cards.    If you have any questions after you get home, please call the office at [phone]    Follow-Up: At [facility], you and your health needs are our priority.  As part of our continuing mission to provide you  with  exceptional heart care, we have created designated Provider Care Teams.  These Care Teams include your primary Cardiologist (physician) and Advanced Practice Providers (APPs -  Physician Assistants and Nurse Practitioners) who all work together to provide you with the care you need, when you need it.  We recommend signing up for the patient portal called "MyChart".  Sign up information is provided on this After Visit Summary.  MyChart is used to connect with patients for Virtual Visits (Telemedicine).  Patients are able to view lab/test results, encounter notes, upcoming appointments, etc.  Non-urgent messages can be sent to your provider as well.   To learn more about what you can do with MyChart, go to [email or web address].    Your next appointment:   Week of August 15th   The format for your next appointment:   In Person  Provider:   Kate Sable, MD   Other Instructions    Signed, Dawn Sable, MD  11/15/2020 11:48 AM    [place]

## 2020-11-15 NOTE — H&P (View-Only) (Signed)
Cardiology Office Note:    Date:  11/15/2020   ID:  Dawn Bruce, DOB Jun 21, 1945, MRN [id number]  PCP:  Virginia Crews, MD   [place]  Cardiologist:  Kate Sable, MD  Advanced Practice Provider:  No care team member to display Electrophysiologist:  None       Referring MD: Virginia Crews, MD   No chief complaint on file.   History of Present Illness:    Dawn Bruce is a 75 y.o. female with a hx of persistent atrial fibrillation, hypertension, hyperlipidemia, former smoker, pleural effusion worse on the left s/p thoracenteses 10/2020 who presents for follow-up.    Patient is being seen for persistent atrial fibrillation, and shortness of breath.  Echo did show left-sided effusion, this was confirmed on chest CT.  Was referred to pulmonary medicine, thoracentesis was eventually performed 10/28/2020, 950 cc of fluid removed.  Lasix was stopped due to worsening creatinine, follow-up creatinine showed improvement in renal function.  She felt better after thoracentesis for a week or 2, shortness of breath return after.  Denies palpitations.  Has been on Eliquis since her thoracentesis on 10/28/2020.   Prior notes Echo 09/2020, EF 50 to 94%, grade 2 diastolic dysfunction, moderate left pleural effusion.    Past Medical History:  Diagnosis Date   Actinic keratosis    Chronic pain    Hyperlipidemia    Hypertension    Squamous cell carcinoma of skin 09/10/2019   R dorsum hand and L wrist    Squamous cell carcinoma of skin 10/28/2019   R dorsum hand prox lateral    Squamous cell carcinoma of skin 10/28/2019   R dorsum hand prox medial    Squamous cell carcinoma of skin 10/28/2019   R dorsum hand distal lateral    Squamous cell carcinoma of skin 10/28/2019   R dorsum hand distal medial    Squamous cell carcinoma of skin 03/04/2020   SCC IS R medial infraorbital, LN2 04/28/20   Squamous cell carcinoma of skin 03/04/2020   R dorsal hand, EDC    Squamous cell carcinoma of skin 03/04/2020   SCC IS, L dorsum wrist, EDC    Past Surgical History:  Procedure Laterality Date   ABDOMINAL HYSTERECTOMY     AUGMENTATION MAMMAPLASTY Bilateral    25-30 years ago   CAROTID ARTERY ANGIOPLASTY Right 1995(approximate)   CAROTID ENDARTERECTOMY  2001   COSMETIC SURGERY     ELBOW SURGERY     TONSILLECTOMY      Current Medications: Current Meds  Medication Sig   amLODipine (NORVASC) 5 MG tablet Take 1 tablet (5 mg total) by mouth daily.   apixaban (ELIQUIS) 5 MG TABS tablet Take 1 tablet (5 mg total) by mouth 2 (two) times daily.   atorvastatin (LIPITOR) 40 MG tablet Take 40 mg by mouth daily.   Cholecalciferol (VITAMIN D3) 50 MCG (2000 UT) capsule Take 2,000 Units by mouth daily.   escitalopram (LEXAPRO) 10 MG tablet Take 1 tablet (10 mg total) by mouth daily.   famotidine (PEPCID) 40 MG tablet Take 1 tablet (40 mg total) by mouth daily.   fentaNYL (DURAGESIC) 50 MCG/HR Place 1 patch onto the skin every 3 (three) days.   metoprolol tartrate (LOPRESSOR) 50 MG tablet Take 1 tablet (50 mg total) by mouth 2 (two) times daily.   pregabalin (LYRICA) 150 MG capsule Take 1 capsule (150 mg total) by mouth 2 (two) times daily.     Allergies:   Patient  has no known allergies.   Social History   Socioeconomic History   Marital status: Married    Spouse name: Not on file   Number of children: 3   Years of education: Not on file   Highest education level: Some college, no degree  Occupational History   Occupation: retired  Tobacco Use   Smoking status: Former    Packs/day: 0.50    Years: 5.00    Pack years: 2.50    Types: Cigarettes    Quit date: 1990    Years since quitting: 32.5   Smokeless tobacco: Never   Tobacco comments:    quit around 1990-1995 (?)  Vaping Use   Vaping Use: Never used  Substance and Sexual Activity   Alcohol use: Yes    Alcohol/week: 21.0 standard drinks    Types: 21 Shots of liquor per week    Comment: 3  scotch drinks a night   Drug use: Never   Sexual activity: Not on file  Other Topics Concern   Not on file  Social History Narrative   Not on file   Social Determinants of Health   Financial Resource Strain: Low Risk    Difficulty of Paying Living Expenses: Not hard at all  Food Insecurity: No Food Insecurity   Worried About Charity fundraiser in the Last Year: Never true   [place] in the Last Year: Never true  Transportation Needs: No Transportation Needs   Lack of Transportation (Medical): No   Lack of Transportation (Non-Medical): No  Physical Activity: Insufficiently Active   Days of Exercise per Week: 7 days   Minutes of Exercise per Session: 10 min  Stress: No Stress Concern Present   Feeling of Stress : Not at all  Social Connections: Moderately Isolated   Frequency of Communication with Friends and Family: More than three times a week   Frequency of Social Gatherings with Friends and Family: Twice a week   Attends Religious Services: Never   Marine scientist or Organizations: No   Attends Music therapist: Never   Marital Status: Married     Family History: The patient's family history includes Cancer in her father and mother. There is no history of Breast cancer.  ROS:   Please see the history of present illness.     All other systems reviewed and are negative.  EKGs/Labs/Other Studies Reviewed:    The following studies were reviewed today:   EKG:  EKG not  ordered today.  Recent Labs: 04/20/2020: ALT 12 10/13/2020: BUN 28; Creatinine, Ser 1.41; Potassium 4.4; Sodium 141  Recent Lipid Panel    Component Value Date/Time   CHOL 178 04/20/2020 1121   TRIG 79 04/20/2020 1121   HDL 61 04/20/2020 1121   CHOLHDL 2.9 04/20/2020 1121   LDLCALC 102 (H) 04/20/2020 1121     Risk Assessment/Calculations:      Physical Exam:    VS:  BP 126/72 (BP Location: Left Arm, Patient Position: Sitting, Cuff Size: Normal)   Pulse 84   Ht  5\' 3"  (1.6 m)   Wt 128 lb (58.1 kg)   SpO2 90%   BMI 22.67 kg/m     Wt Readings from Last 3 Encounters:  11/15/20 128 lb (58.1 kg)  10/28/20 129 lb 2 oz (58.6 kg)  10/20/20 131 lb (59.4 kg)     GEN:  Well nourished, well developed in no acute distress HEENT: Normal NECK: No JVD; No carotid bruits  LYMPHATICS: No lymphadenopathy CARDIAC: Irregular irregular, no murmurs, rubs, gallops RESPIRATORY: Clear anteriorly, decreased breath sounds at bases, worse on left lower base ABDOMEN: Soft, non-tender, non-distended MUSCULOSKELETAL:  No edema; No deformity  SKIN: Warm and dry NEUROLOGIC:  Alert and oriented x 3 PSYCHIATRIC:  Normal affect   ASSESSMENT:    1. Persistent atrial fibrillation ([place])   2. Pleural effusion   3. Primary hypertension     PLAN:    In order of problems listed above:  Persistent A. fib, CHA2DS2-VASc 4 (age, htn, gender).  Echo with preserved ejection fraction.  Continue Lopressor, Eliquis.  Plan for DC cardioversion in 2 weeks. Pleural effusion, moderate on left, small right.  S/p thoracentesis 10/2020, 950 cc of fluid removed.  Decreased breath sounds on left lower base, possible fluid recurrence.  Follow-up with pulmonary medicine regarding fluid analysis, cytology.  I do not believe pleural effusion is consistent with cardiac etiology.  Typically edema from cardiac etiology is a call bilaterally.  Patient's effusion is worse on the left, at least moderate to severe, while right pleural effusion is small/mild.  Plan for DC cardioversion as above to manage A. fib. Hypertension, BP controlled, continue amlodipine, Lopressor .   Follow-up after DC cardioversion   Shared Decision Making/Informed Consent The risks (stroke, cardiac arrhythmias rarely resulting in the need for a temporary or permanent pacemaker, skin irritation or burns and complications associated with conscious sedation including aspiration, arrhythmia, respiratory failure and death), benefits  (restoration of normal sinus rhythm) and alternatives of a direct current cardioversion were explained in detail to Dawn Bruce and she agrees to proceed.     Medication Adjustments/Labs and Tests Ordered: Current medicines are reviewed at length with the patient today.  Concerns regarding medicines are outlined above.  Orders Placed This Encounter  Procedures   CBC   Basic metabolic panel   EKG 48-NIOE    No orders of the defined types were placed in this encounter.   Patient Instructions  Medication Instructions:  Your physician recommends that you continue on your current medications as directed. Please refer to the Current Medication list given to you today.  *If you need a refill on your cardiac medications before your next appointment, please call your pharmacy*   Lab Work:  Your physician recommends that you return for lab work in: any day next week  - Please go to the [facility]. You will check in at the front desk to the right as you walk into the atrium. Valet Parking is offered if needed. - No appointment needed. You may go any day between 7 am and 6 pm.    Testing/Procedures:   You are scheduled for a Cardioversion on __Wednesday 7/13/22__ with Dr._Agbor-Etang__________ Please arrive at the [place] [facility] at _________ a.m. on the day of your procedure.  DIET INSTRUCTIONS:  Nothing to eat or drink after midnight except your medications with a sip of water.         Labs: ___See Lab Instructions above_______________  Medications:  YOU MAY TAKE ALL of your remaining medications with a small amount of water.  Must have a responsible person to drive you home.  Bring a current list of your medications and current insurance cards.    If you have any questions after you get home, please call the office at [phone]    Follow-Up: At [facility], you and your health needs are our priority.  As part of our continuing mission to provide you  with  exceptional heart care, we have created designated Provider Care Teams.  These Care Teams include your primary Cardiologist (physician) and Advanced Practice Providers (APPs -  Physician Assistants and Nurse Practitioners) who all work together to provide you with the care you need, when you need it.  We recommend signing up for the patient portal called "MyChart".  Sign up information is provided on this After Visit Summary.  MyChart is used to connect with patients for Virtual Visits (Telemedicine).  Patients are able to view lab/test results, encounter notes, upcoming appointments, etc.  Non-urgent messages can be sent to your provider as well.   To learn more about what you can do with MyChart, go to [email or web address].    Your next appointment:   Week of August 15th   The format for your next appointment:   In Person  Provider:   Kate Sable, MD   Other Instructions    Signed, Kate Sable, MD  11/15/2020 11:48 AM    [place]

## 2020-11-15 NOTE — Patient Instructions (Signed)
Medication Instructions:  Your physician recommends that you continue on your current medications as directed. Please refer to the Current Medication list given to you today.  *If you need a refill on your cardiac medications before your next appointment, please call your pharmacy*   Lab Work:  Your physician recommends that you return for lab work in: any day next week  - Please go to the [facility]. You will check in at the front desk to the right as you walk into the atrium. Valet Parking is offered if needed. - No appointment needed. You may go any day between 7 am and 6 pm.    Testing/Procedures:   You are scheduled for a Cardioversion on __Wednesday 7/13/22__ with Dr._Agbor-Etang__________ Please arrive at the [place] [facility] at _________ a.m. on the day of your procedure.  DIET INSTRUCTIONS:  Nothing to eat or drink after midnight except your medications with a sip of water.         Labs: ___See Lab Instructions above_______________  Medications:  YOU MAY TAKE ALL of your remaining medications with a small amount of water.  Must have a responsible person to drive you home.  Bring a current list of your medications and current insurance cards.    If you have any questions after you get home, please call the office at [phone]    Follow-Up: At [facility], you and your health needs are our priority.  As part of our continuing mission to provide you with exceptional heart care, we have created designated Provider Care Teams.  These Care Teams include your primary Cardiologist (physician) and Advanced Practice Providers (APPs -  Physician Assistants and Nurse Practitioners) who all work together to provide you with the care you need, when you need it.  We recommend signing up for the patient portal called "MyChart".  Sign up information is provided on this After Visit Summary.  MyChart is used to connect with patients for Virtual Visits (Telemedicine).   Patients are able to view lab/test results, encounter notes, upcoming appointments, etc.  Non-urgent messages can be sent to your provider as well.   To learn more about what you can do with MyChart, go to [email or web address].    Your next appointment:   Week of August 15th   The format for your next appointment:   In Person  Provider:   Kate Sable, MD   Other Instructions

## 2020-11-17 ENCOUNTER — Telehealth: Payer: Self-pay | Admitting: Pulmonary Disease

## 2020-11-17 ENCOUNTER — Ambulatory Visit: Payer: Medicare Other | Admitting: Pulmonary Disease

## 2020-11-17 ENCOUNTER — Encounter: Payer: Self-pay | Admitting: Pulmonary Disease

## 2020-11-17 ENCOUNTER — Other Ambulatory Visit: Payer: Self-pay

## 2020-11-17 VITALS — BP 132/64 | HR 106 | Temp 97.9°F | Ht 63.0 in | Wt 128.8 lb

## 2020-11-17 DIAGNOSIS — N1831 Chronic kidney disease, stage 3a: Secondary | ICD-10-CM | POA: Diagnosis not present

## 2020-11-17 DIAGNOSIS — J90 Pleural effusion, not elsewhere classified: Secondary | ICD-10-CM | POA: Diagnosis not present

## 2020-11-17 DIAGNOSIS — Z7901 Long term (current) use of anticoagulants: Secondary | ICD-10-CM

## 2020-11-17 DIAGNOSIS — I5032 Chronic diastolic (congestive) heart failure: Secondary | ICD-10-CM

## 2020-11-17 NOTE — Telephone Encounter (Signed)
Patient Instructions by Garner Nash, DO at 11/17/2020 9:15 AM  Author: Garner Nash, DO Author Type: Physician Filed: 11/17/2020 10:03 AM  Note Status: Signed Cosign: Cosign Not Required Encounter Date: 11/17/2020  Editor: Garner Nash, DO (Physician)               Thank you for visiting Dr. Valeta Harms at [facility]. Today we recommend the following:   Possible in office thoracentesis. Pending discussion with Cardiology    Return in about 12 days (around 11/29/2020) for tentative date .       Dr. Valeta Harms, please advise if we can use a held slot to get pt back in for a f/u with you.

## 2020-11-17 NOTE — Telephone Encounter (Signed)
Called and spoke with pt and have scheduled her for a f/u with Dr. Valeta Harms 7/11 at 10:30. Nothing further needed.

## 2020-11-17 NOTE — Patient Instructions (Signed)
Thank you for visiting Dr. Valeta Harms at [facility]. Today we recommend the following:  Possible in office thoracentesis. Pending discussion with Cardiology   Return in about 12 days (around 11/29/2020) for tentative date .    Please do your part to reduce the spread of COVID-19.

## 2020-11-17 NOTE — Progress Notes (Signed)
Synopsis: Referred in June 2022 for pleural effusion by Virginia Crews, MD  Subjective:   PATIENT ID: Dawn Bruce GENDER: female DOB: 03-11-1946, MRN: [id number]  Chief Complaint  Patient presents with   Follow-up    Pleural effusion, patient feels like she has more fluid, has procedure schedule for A-fib July 13.     This is a 75 year old female, past medical history of hyperlipidemia, hypertension, skin cancer, mother with lung cancer, father with bladder cancer.  Patient is a former smoker quit in 1995.  Patient was in [place] in April developed shortness of breath.  Came home and met with her cardiologist for evaluation.  CT scan of the chest revealed bilateral pleural effusion.  She was found to be in atrial fibrillation.  She was placed on rate control medications as well as apixaban.  Patient was referred for evaluation of bilateral pleural effusion and consideration for thoracentesis.  CT scan of the chest was completed on 09/28/2020 which revealed a moderate sized left effusion and a small right effusion.  She also has a right lung nodule.  OV 10/28/2020: Here today for planned office-based thoracentesis.  The patient has held her anticoagulation.Please see signed consent.  We discussed the risk benefits and alternatives before proceeding with thoracentesis today.  OV 11/17/2020: Here today for evaluation of recurrent shortness of breath and cough.  She had a thoracentesis in the office on 10/28/2020.  This revealed atypical mesothelial cells.There was no obvious evidence of malignancy.  Additionally there was 75% lymphocytes within the effusion.  She is however planned for DC cardioversion by cardiology on July 13.  And maintained on Eliquis at this time.   Past Medical History:  Diagnosis Date   Actinic keratosis    Chronic pain    Hyperlipidemia    Hypertension    Squamous cell carcinoma of skin 09/10/2019   R dorsum hand and L wrist    Squamous cell carcinoma of skin  10/28/2019   R dorsum hand prox lateral    Squamous cell carcinoma of skin 10/28/2019   R dorsum hand prox medial    Squamous cell carcinoma of skin 10/28/2019   R dorsum hand distal lateral    Squamous cell carcinoma of skin 10/28/2019   R dorsum hand distal medial    Squamous cell carcinoma of skin 03/04/2020   SCC IS R medial infraorbital, LN2 04/28/20   Squamous cell carcinoma of skin 03/04/2020   R dorsal hand, EDC   Squamous cell carcinoma of skin 03/04/2020   SCC IS, L dorsum wrist, EDC     Family History  Problem Relation Age of Onset   Cancer Mother    Cancer Father    Breast cancer Neg Hx      Past Surgical History:  Procedure Laterality Date   ABDOMINAL HYSTERECTOMY     AUGMENTATION MAMMAPLASTY Bilateral    25-30 years ago   CAROTID ARTERY ANGIOPLASTY Right 1995(approximate)   CAROTID ENDARTERECTOMY  2001   COSMETIC SURGERY     ELBOW SURGERY     TONSILLECTOMY      Social History   Socioeconomic History   Marital status: Married    Spouse name: Not on file   Number of children: 3   Years of education: Not on file   Highest education level: Some college, no degree  Occupational History   Occupation: retired  Tobacco Use   Smoking status: Former    Packs/day: 0.50    Years: 5.00  Pack years: 2.50    Types: Cigarettes    Quit date: 47    Years since quitting: 32.5   Smokeless tobacco: Never   Tobacco comments:    quit around [phone] (?)  Vaping Use   Vaping Use: Never used  Substance and Sexual Activity   Alcohol use: Yes    Alcohol/week: 21.0 standard drinks    Types: 21 Shots of liquor per week    Comment: 3 scotch drinks a night   Drug use: Never   Sexual activity: Not on file  Other Topics Concern   Not on file  Social History Narrative   Not on file   Social Determinants of Health   Financial Resource Strain: Low Risk    Difficulty of Paying Living Expenses: Not hard at all  Food Insecurity: No Food Insecurity   Worried About  Charity fundraiser in the Last Year: Never true   [place] in the Last Year: Never true  Transportation Needs: No Transportation Needs   Lack of Transportation (Medical): No   Lack of Transportation (Non-Medical): No  Physical Activity: Insufficiently Active   Days of Exercise per Week: 7 days   Minutes of Exercise per Session: 10 min  Stress: No Stress Concern Present   Feeling of Stress : Not at all  Social Connections: Moderately Isolated   Frequency of Communication with Friends and Family: More than three times a week   Frequency of Social Gatherings with Friends and Family: Twice a week   Attends Religious Services: Never   Marine scientist or Organizations: No   Attends Music therapist: Never   Marital Status: Married  Human resources officer Violence: Not At Risk   Fear of Current or Ex-Partner: No   Emotionally Abused: No   Physically Abused: No   Sexually Abused: No     No Known Allergies   Outpatient Medications Prior to Visit  Medication Sig Dispense Refill   amLODipine (NORVASC) 5 MG tablet Take 1 tablet (5 mg total) by mouth daily. 90 tablet 1   apixaban (ELIQUIS) 5 MG TABS tablet Take 1 tablet (5 mg total) by mouth 2 (two) times daily. 60 tablet 5   atorvastatin (LIPITOR) 40 MG tablet Take 40 mg by mouth daily.     Cholecalciferol (VITAMIN D3) 50 MCG (2000 UT) capsule Take 2,000 Units by mouth daily.     escitalopram (LEXAPRO) 10 MG tablet Take 1 tablet (10 mg total) by mouth daily. 90 tablet 0   famotidine (PEPCID) 40 MG tablet Take 1 tablet (40 mg total) by mouth daily. 90 tablet 0   fentaNYL (DURAGESIC) 50 MCG/HR Place 1 patch onto the skin every 3 (three) days. 10 patch 0   metoprolol tartrate (LOPRESSOR) 50 MG tablet Take 1 tablet (50 mg total) by mouth 2 (two) times daily. 6 tablet 0   pregabalin (LYRICA) 150 MG capsule Take 1 capsule (150 mg total) by mouth 2 (two) times daily. 180 capsule 3   No facility-administered medications prior  to visit.    Review of Systems  Constitutional:  Negative for chills, fever, malaise/fatigue and weight loss.  HENT:  Negative for hearing loss, sore throat and tinnitus.   Eyes:  Negative for blurred vision and double vision.  Respiratory:  Positive for cough and shortness of breath. Negative for hemoptysis, sputum production, wheezing and stridor.   Cardiovascular:  Negative for chest pain, palpitations, orthopnea, leg swelling and PND.  Gastrointestinal:  Negative for  abdominal pain, constipation, diarrhea, heartburn, nausea and vomiting.  Genitourinary:  Negative for dysuria, hematuria and urgency.  Musculoskeletal:  Negative for joint pain and myalgias.  Skin:  Negative for itching and rash.  Neurological:  Negative for dizziness, tingling, weakness and headaches.  Endo/Heme/Allergies:  Negative for environmental allergies. Does not bruise/bleed easily.  Psychiatric/Behavioral:  Negative for depression. The patient is not nervous/anxious and does not have insomnia.   All other systems reviewed and are negative.   Objective:  Physical Exam Vitals reviewed.  Constitutional:      General: She is not in acute distress.    Appearance: She is well-developed.  HENT:     Head: Normocephalic and atraumatic.  Eyes:     General: No scleral icterus.    Conjunctiva/sclera: Conjunctivae normal.     Pupils: Pupils are equal, round, and reactive to light.  Neck:     Vascular: No JVD.     Trachea: No tracheal deviation.  Cardiovascular:     Rate and Rhythm: Normal rate. Rhythm irregular.     Heart sounds: Normal heart sounds. No murmur heard. Pulmonary:     Effort: Pulmonary effort is normal. No tachypnea, accessory muscle usage or respiratory distress.     Breath sounds: No stridor. No wheezing, rhonchi or rales.     Comments: Absent breath sounds in the left chest compared to the right base. Abdominal:     General: Bowel sounds are normal. There is no distension.     Palpations:  Abdomen is soft.     Tenderness: There is no abdominal tenderness.  Musculoskeletal:        General: No tenderness.     Cervical back: Neck supple.  Lymphadenopathy:     Cervical: No cervical adenopathy.  Skin:    General: Skin is warm and dry.     Capillary Refill: Capillary refill takes less than 2 seconds.     Findings: No rash.  Neurological:     Mental Status: She is alert and oriented to person, place, and time.  Psychiatric:        Behavior: Behavior normal.     Vitals:   11/17/20 0918  BP: 132/64  Pulse: (!) 106  Temp: 97.9 F (36.6 C)  TempSrc: Temporal  SpO2: 95%  Weight: 128 lb 12.8 oz (58.4 kg)  Height: 5' 3"  (1.6 m)    95% on RA BMI Readings from Last 3 Encounters:  11/17/20 22.82 kg/m  11/15/20 22.67 kg/m  10/28/20 22.87 kg/m   Wt Readings from Last 3 Encounters:  11/17/20 128 lb 12.8 oz (58.4 kg)  11/15/20 128 lb (58.1 kg)  10/28/20 129 lb 2 oz (58.6 kg)     CBC    Component Value Date/Time   WBC 6.8 01/02/2019 1402   RBC 4.95 01/02/2019 1402   HGB 14.7 01/02/2019 1402   HCT 43.9 01/02/2019 1402   PLT 179 01/02/2019 1402   MCV 89 01/02/2019 1402   MCH 29.7 01/02/2019 1402   MCHC 33.5 01/02/2019 1402   RDW 13.9 01/02/2019 1402   LYMPHSABS 1.0 01/02/2019 1402   EOSABS 0.2 01/02/2019 1402   BASOSABS 0.0 01/02/2019 1402    Chest Imaging: CT chest 09/28/2020: Moderate left-sided effusion, small right-sided effusion, right pulmonary nodule 6 mm along the oblique fissure The patient's images have been independently reviewed by me.    Pulmonary Functions Testing Results: No flowsheet data found.  FeNO:   Pathology:   Echocardiogram:   Heart Catheterization:  Assessment & Plan:     ICD-10-CM   1. Recurrent left pleural effusion  J90    10/28/2020 75% Lymphs, atypical mesothelial cells     2. Bilateral pleural effusion  J90     3. On continuous oral anticoagulation  Z79.01     4. Chronic diastolic heart failure (HCC)   I50.32     5. Stage 3a chronic kidney disease ([place])  N18.31        Discussion:  This is a 75 year old female, bilateral effusion however the right side is small and she does have a recurrent large left effusion status post thoracentesis at the beginning of June.  She is on anticoagulation with Eliquis for planned DC cardioversion by cardiology July 13.  Fluid analysis of the left effusion reveals 75% lymphocytes and atypical mesothelial cells with no obvious sign of malignancy.  Plan: Discussed case with patient's primary cardiologist Dr. Charlestine Night. Discussed the risk benefits and alternatives of proceeding with thoracentesis while on anticoagulation. Patient cannot come off anticoagulation due to plans for DC cardioversion. Cardiology does not want to delay this. Also with a recurrent large effusion it may inhibit patient from maintaining in normal sinus rhythm following cardioversion.  Therefore we will plan for a repeat thoracentesis in the office on 11/29/2020.  We will do this while the patient is on oral anticoagulation and understanding the risk that may be associated with a slight increase of bleeding.  After further discussion with cardiology and the patient we feel like this is the next best step.  If the patient still has a recurrent left-sided lymphocyte predominant effusion with a cytology that is negative for malignancy we may need to consider referral to thoracic surgery for pleural-based biopsies.    Current Outpatient Medications:    amLODipine (NORVASC) 5 MG tablet, Take 1 tablet (5 mg total) by mouth daily., Disp: 90 tablet, Rfl: 1   apixaban (ELIQUIS) 5 MG TABS tablet, Take 1 tablet (5 mg total) by mouth 2 (two) times daily., Disp: 60 tablet, Rfl: 5   atorvastatin (LIPITOR) 40 MG tablet, Take 40 mg by mouth daily., Disp: , Rfl:    Cholecalciferol (VITAMIN D3) 50 MCG (2000 UT) capsule, Take 2,000 Units by mouth daily., Disp: , Rfl:    escitalopram (LEXAPRO) 10 MG tablet,  Take 1 tablet (10 mg total) by mouth daily., Disp: 90 tablet, Rfl: 0   famotidine (PEPCID) 40 MG tablet, Take 1 tablet (40 mg total) by mouth daily., Disp: 90 tablet, Rfl: 0   fentaNYL (DURAGESIC) 50 MCG/HR, Place 1 patch onto the skin every 3 (three) days., Disp: 10 patch, Rfl: 0   metoprolol tartrate (LOPRESSOR) 50 MG tablet, Take 1 tablet (50 mg total) by mouth 2 (two) times daily., Disp: 6 tablet, Rfl: 0   pregabalin (LYRICA) 150 MG capsule, Take 1 capsule (150 mg total) by mouth 2 (two) times daily., Disp: 180 capsule, Rfl: 3    Garner Nash, DO [facility] 11/17/2020 9:43 AM

## 2020-11-17 NOTE — Progress Notes (Signed)
[place] Chest Procedure Note  Dawn Bruce  [id number]  06/04/1945  Date:11/17/20  Time:9:56 AM   Provider Performing:Samin Milke L Jernard Reiber   Procedure: [place] Chest   Indication(s) Pleural Effusion  Consent Risks of the procedure as well as the alternatives and risks of each were explained to the patient and/or caregiver.  Consent for the procedure was obtained and is signed in the bedside chart  Anesthesia Topical only with 1% lidocaine   Time Out Verified patient identification, verified procedure, site/side was marked, verified correct patient position, special equipment/implants available, medications/allergies/relevant history reviewed, required imaging and test results available.  Sterile Technique Not needed   Procedure Description Ultrasound was used to identify appropriate pleural anatomy.  Patient has a recurrent moderate-sized left-sided effusion as defined by ultrasound imaging below.  Additionally has a small amount of fluid within the right pleural space.  Complications/Tolerance None; patient tolerated the procedure well.  EBL N/a   Specimen(s) None  Images:    Left effusion above    Small right effusion above  Dawn Nash, DO  Pulmonary Critical Care 11/17/2020 10:02 AM

## 2020-11-19 ENCOUNTER — Other Ambulatory Visit
Admission: RE | Admit: 2020-11-19 | Discharge: 2020-11-19 | Disposition: A | Discharge status: Home / Self Care | Payer: Medicare Other | Source: Ambulatory Visit | Attending: Cardiology | Admitting: Cardiology

## 2020-11-19 DIAGNOSIS — I4819 Other persistent atrial fibrillation: Secondary | ICD-10-CM | POA: Insufficient documentation

## 2020-11-19 LAB — BASIC METABOLIC PANEL
Anion gap: 8 (ref 5–15)
BUN: 32 mg/dL — ABNORMAL HIGH (ref 8–23)
CO2: 27 mmol/L (ref 22–32)
Calcium: 9.1 mg/dL (ref 8.9–10.3)
Chloride: 106 mmol/L (ref 98–111)
Creatinine, Ser: 1.53 mg/dL — ABNORMAL HIGH (ref 0.44–1.00)
GFR, Estimated: 35 mL/min — ABNORMAL LOW (ref >60)
Glucose, Bld: 121 mg/dL — ABNORMAL HIGH (ref 70–99)
Potassium: 4.8 mmol/L (ref 3.5–5.1)
Sodium: 141 mmol/L (ref 135–145)

## 2020-11-19 LAB — CBC
HCT: 46.8 % — ABNORMAL HIGH (ref 36.0–46.0)
Hemoglobin: 15.3 g/dL — ABNORMAL HIGH (ref 12.0–15.0)
MCH: 28 pg (ref 26.0–34.0)
MCHC: 32.7 g/dL (ref 30.0–36.0)
MCV: 85.6 fL (ref 80.0–100.0)
Platelets: 183 10*3/uL (ref 150–400)
RBC: 5.47 MIL/uL — ABNORMAL HIGH (ref 3.87–5.11)
RDW: 15.2 % (ref 11.5–15.5)
WBC: 6.5 10*3/uL (ref 4.0–10.5)
nRBC: 0 % (ref 0.0–0.2)

## 2020-11-22 NOTE — Telephone Encounter (Signed)
BI please advise if you spoke with the cardiologist about Ms. Dawn Bruce?  Thanks

## 2020-11-29 ENCOUNTER — Encounter: Payer: Self-pay | Admitting: Pulmonary Disease

## 2020-11-29 ENCOUNTER — Other Ambulatory Visit: Payer: Self-pay

## 2020-11-29 ENCOUNTER — Other Ambulatory Visit (HOSPITAL_COMMUNITY)
Admission: RE | Admit: 2020-11-29 | Discharge: 2020-11-29 | Disposition: A | Discharge status: Home / Self Care | Payer: Medicare Other | Source: Ambulatory Visit | Attending: Pulmonary Disease | Admitting: Pulmonary Disease

## 2020-11-29 ENCOUNTER — Ambulatory Visit (INDEPENDENT_AMBULATORY_CARE_PROVIDER_SITE_OTHER): Payer: Medicare Other

## 2020-11-29 ENCOUNTER — Ambulatory Visit: Payer: Medicare Other | Admitting: Pulmonary Disease

## 2020-11-29 VITALS — HR 66 | Ht 63.0 in | Wt 131.0 lb

## 2020-11-29 DIAGNOSIS — N1831 Chronic kidney disease, stage 3a: Secondary | ICD-10-CM

## 2020-11-29 DIAGNOSIS — J90 Pleural effusion, not elsewhere classified: Secondary | ICD-10-CM | POA: Insufficient documentation

## 2020-11-29 DIAGNOSIS — Z85828 Personal history of other malignant neoplasm of skin: Secondary | ICD-10-CM | POA: Insufficient documentation

## 2020-11-29 DIAGNOSIS — Z7901 Long term (current) use of anticoagulants: Secondary | ICD-10-CM | POA: Diagnosis not present

## 2020-11-29 DIAGNOSIS — I5032 Chronic diastolic (congestive) heart failure: Secondary | ICD-10-CM

## 2020-11-29 DIAGNOSIS — J948 Other specified pleural conditions: Secondary | ICD-10-CM | POA: Diagnosis not present

## 2020-11-29 LAB — BODY FLUID CELL COUNT WITH DIFFERENTIAL
Eos, Fluid: 1 %
Lymphs, Fluid: 84 %
Monocyte-Macrophage-Serous Fluid: 2 % — ABNORMAL LOW (ref 50–90)
Neutrophil Count, Fluid: 13 % (ref 0–25)
Total Nucleated Cell Count, Fluid: 741 cu mm (ref 0–1000)

## 2020-11-29 LAB — PROTEIN, PLEURAL OR PERITONEAL FLUID: Total protein, fluid: <3 g/dL

## 2020-11-29 LAB — LACTATE DEHYDROGENASE, PLEURAL OR PERITONEAL FLUID: LD, Fluid: 43 U/L — ABNORMAL HIGH (ref 3–23)

## 2020-11-29 NOTE — H&P (View-Only) (Signed)
Synopsis: Referred in June 2022 for pleural effusion by Virginia Crews, MD  Subjective:   PATIENT ID: Dawn Bruce GENDER: female DOB: November 29, 1945, MRN: [id number]  Chief Complaint  Patient presents with   Procedure    Thorocentisis left side     This is a 75 year old female, past medical history of hyperlipidemia, hypertension, skin cancer, mother with lung cancer, father with bladder cancer.  Patient is a former smoker quit in 1995.  Patient was in [place] in April developed shortness of breath.  Came home and met with her cardiologist for evaluation.  CT scan of the chest revealed bilateral pleural effusion.  She was found to be in atrial fibrillation.  She was placed on rate control medications as well as apixaban.  Patient was referred for evaluation of bilateral pleural effusion and consideration for thoracentesis.  CT scan of the chest was completed on 09/28/2020 which revealed a moderate sized left effusion and a small right effusion.  She also has a right lung nodule.  OV 10/28/2020: Here today for planned office-based thoracentesis.  The patient has held her anticoagulation.Please see signed consent.  We discussed the risk benefits and alternatives before proceeding with thoracentesis today.  OV 11/17/2020: Here today for evaluation of recurrent shortness of breath and cough.  She had a thoracentesis in the office on 10/28/2020.  This revealed atypical mesothelial cells.There was no obvious evidence of malignancy.  Additionally there was 75% lymphocytes within the effusion.  She is however planned for DC cardioversion by cardiology on July 13.  And maintained on Eliquis at this time.  OV 11/29/2020: Patient here today for outpatient base left-sided thoracentesis.  Consent signed.  We discussed risk benefits and alternatives patient is agreeable to proceed.  Patient is on anticoagulation due to planned DCCV cardioversion this week.  We discussed the added risk of doing this procedure  while on anticoagulation.  Patient is agreeable to proceed.   Past Medical History:  Diagnosis Date   Actinic keratosis    Chronic pain    Hyperlipidemia    Hypertension    Squamous cell carcinoma of skin 09/10/2019   R dorsum hand and L wrist    Squamous cell carcinoma of skin 10/28/2019   R dorsum hand prox lateral    Squamous cell carcinoma of skin 10/28/2019   R dorsum hand prox medial    Squamous cell carcinoma of skin 10/28/2019   R dorsum hand distal lateral    Squamous cell carcinoma of skin 10/28/2019   R dorsum hand distal medial    Squamous cell carcinoma of skin 03/04/2020   SCC IS R medial infraorbital, LN2 04/28/20   Squamous cell carcinoma of skin 03/04/2020   R dorsal hand, EDC   Squamous cell carcinoma of skin 03/04/2020   SCC IS, L dorsum wrist, EDC     Family History  Problem Relation Age of Onset   Cancer Mother    Cancer Father    Breast cancer Neg Hx      Past Surgical History:  Procedure Laterality Date   ABDOMINAL HYSTERECTOMY     AUGMENTATION MAMMAPLASTY Bilateral    25-30 years ago   CAROTID ARTERY ANGIOPLASTY Right 1995(approximate)   CAROTID ENDARTERECTOMY  2001   COSMETIC SURGERY     ELBOW SURGERY     TONSILLECTOMY      Social History   Socioeconomic History   Marital status: Married    Spouse name: Not on file   Number of children: 3  Years of education: Not on file   Highest education level: Some college, no degree  Occupational History   Occupation: retired  Tobacco Use   Smoking status: Former    Packs/day: 0.50    Years: 5.00    Pack years: 2.50    Types: Cigarettes    Quit date: 1990    Years since quitting: 32.5   Smokeless tobacco: Never   Tobacco comments:    quit around 1990-1995 (?)  Vaping Use   Vaping Use: Never used  Substance and Sexual Activity   Alcohol use: Yes    Alcohol/week: 21.0 standard drinks    Types: 21 Shots of liquor per week    Comment: 3 scotch drinks a night   Drug use: Never    Sexual activity: Not on file  Other Topics Concern   Not on file  Social History Narrative   Not on file   Social Determinants of Health   Financial Resource Strain: Low Risk    Difficulty of Paying Living Expenses: Not hard at all  Food Insecurity: No Food Insecurity   Worried About Charity fundraiser in the Last Year: Never true   Peters in the Last Year: Never true  Transportation Needs: No Transportation Needs   Lack of Transportation (Medical): No   Lack of Transportation (Non-Medical): No  Physical Activity: Insufficiently Active   Days of Exercise per Week: 7 days   Minutes of Exercise per Session: 10 min  Stress: No Stress Concern Present   Feeling of Stress : Not at all  Social Connections: Moderately Isolated   Frequency of Communication with Friends and Family: More than three times a week   Frequency of Social Gatherings with Friends and Family: Twice a week   Attends Religious Services: Never   Marine scientist or Organizations: No   Attends Music therapist: Never   Marital Status: Married  Human resources officer Violence: Not At Risk   Fear of Current or Ex-Partner: No   Emotionally Abused: No   Physically Abused: No   Sexually Abused: No     No Known Allergies   Outpatient Medications Prior to Visit  Medication Sig Dispense Refill   amLODipine (NORVASC) 5 MG tablet Take 1 tablet (5 mg total) by mouth daily. 90 tablet 1   apixaban (ELIQUIS) 5 MG TABS tablet Take 1 tablet (5 mg total) by mouth 2 (two) times daily. 60 tablet 5   atorvastatin (LIPITOR) 40 MG tablet Take 40 mg by mouth daily.     Carboxymethylcellul-Glycerin (CLEAR EYES FOR DRY EYES) 1-0.25 % SOLN Place 1 drop into both eyes daily.     Cholecalciferol (VITAMIN D3) 50 MCG (2000 UT) capsule Take 2,000 Units by mouth daily.     escitalopram (LEXAPRO) 10 MG tablet Take 1 tablet (10 mg total) by mouth daily. 90 tablet 0   famotidine (PEPCID) 40 MG tablet Take 1 tablet (40 mg  total) by mouth daily. 90 tablet 0   fentaNYL (DURAGESIC) 50 MCG/HR Place 1 patch onto the skin every 3 (three) days. 10 patch 0   metoprolol tartrate (LOPRESSOR) 50 MG tablet Take 1 tablet (50 mg total) by mouth 2 (two) times daily. 6 tablet 0   pregabalin (LYRICA) 150 MG capsule Take 1 capsule (150 mg total) by mouth 2 (two) times daily. 180 capsule 3   No facility-administered medications prior to visit.    Review of Systems  Constitutional:  Negative for chills, fever,  malaise/fatigue and weight loss.  HENT:  Negative for hearing loss, sore throat and tinnitus.   Eyes:  Negative for blurred vision and double vision.  Respiratory:  Positive for cough and shortness of breath. Negative for hemoptysis, sputum production, wheezing and stridor.   Cardiovascular:  Negative for chest pain, palpitations, orthopnea, leg swelling and PND.  Gastrointestinal:  Negative for abdominal pain, constipation, diarrhea, heartburn, nausea and vomiting.  Genitourinary:  Negative for dysuria, hematuria and urgency.  Musculoskeletal:  Negative for joint pain and myalgias.  Skin:  Negative for itching and rash.  Neurological:  Negative for dizziness, tingling, weakness and headaches.  Endo/Heme/Allergies:  Negative for environmental allergies. Does not bruise/bleed easily.  Psychiatric/Behavioral:  Negative for depression. The patient is not nervous/anxious and does not have insomnia.   All other systems reviewed and are negative.   Objective:  Physical Exam Vitals reviewed.  Constitutional:      General: She is not in acute distress.    Appearance: She is well-developed.  HENT:     Head: Normocephalic and atraumatic.  Eyes:     General: No scleral icterus.    Conjunctiva/sclera: Conjunctivae normal.     Pupils: Pupils are equal, round, and reactive to light.  Neck:     Vascular: No JVD.     Trachea: No tracheal deviation.  Cardiovascular:     Rate and Rhythm: Normal rate and regular rhythm.      Heart sounds: Normal heart sounds. No murmur heard. Pulmonary:     Effort: Pulmonary effort is normal. No tachypnea, accessory muscle usage or respiratory distress.     Breath sounds: No stridor. No wheezing, rhonchi or rales.     Comments: Absent breath sounds in the bilateral bases, worse on the left compared to the right Abdominal:     General: Bowel sounds are normal. There is no distension.     Palpations: Abdomen is soft.     Tenderness: There is no abdominal tenderness.  Musculoskeletal:        General: No tenderness.     Cervical back: Neck supple.  Lymphadenopathy:     Cervical: No cervical adenopathy.  Skin:    General: Skin is warm and dry.     Capillary Refill: Capillary refill takes less than 2 seconds.     Findings: No rash.  Neurological:     Mental Status: She is alert and oriented to person, place, and time.  Psychiatric:        Behavior: Behavior normal.     Vitals:   11/29/20 1044  Pulse: 66  SpO2: 92%  Weight: 131 lb (59.4 kg)  Height: $Remove'5\' 3"'sxvHMYt$  (1.6 m)    92% on RA BMI Readings from Last 3 Encounters:  11/29/20 23.21 kg/m  11/17/20 22.82 kg/m  11/15/20 22.67 kg/m   Wt Readings from Last 3 Encounters:  11/29/20 131 lb (59.4 kg)  11/17/20 128 lb 12.8 oz (58.4 kg)  11/15/20 128 lb (58.1 kg)     CBC    Component Value Date/Time   WBC 6.5 11/19/2020 1154   RBC 5.47 (H) 11/19/2020 1154   HGB 15.3 (H) 11/19/2020 1154   HGB 14.7 01/02/2019 1402   HCT 46.8 (H) 11/19/2020 1154   HCT 43.9 01/02/2019 1402   PLT 183 11/19/2020 1154   PLT 179 01/02/2019 1402   MCV 85.6 11/19/2020 1154   MCV 89 01/02/2019 1402   MCH 28.0 11/19/2020 1154   MCHC 32.7 11/19/2020 1154   RDW 15.2 11/19/2020 1154  RDW 13.9 01/02/2019 1402   LYMPHSABS 1.0 01/02/2019 1402   EOSABS 0.2 01/02/2019 1402   BASOSABS 0.0 01/02/2019 1402    Chest Imaging: CT chest 09/28/2020: Moderate left-sided effusion, small right-sided effusion, right pulmonary nodule 6 mm along the  oblique fissure The patient's images have been independently reviewed by me.    Pulmonary Functions Testing Results: No flowsheet data found.  FeNO:   Pathology:   Echocardiogram:   Heart Catheterization:     Assessment & Plan:     ICD-10-CM   1. Recurrent left pleural effusion  J90 DG Chest 2 View    Body fluid cell count with differential    Body fluid culture    Cytology - Non PAP;    Glucose, Body Fluid Other    Lactate dehydrogenase (pleural or peritoneal fluid)    Protein, pleural or peritoneal fluid    Protein, pleural or peritoneal fluid    Lactate dehydrogenase (pleural or peritoneal fluid)    Glucose, Body Fluid Other    Body fluid culture    Body fluid cell count with differential    2. Stage 3a chronic kidney disease (HCC)  N18.31     3. Chronic diastolic heart failure (HCC)  V12.48     4. On continuous oral anticoagulation  Z79.01     5. Bilateral pleural effusion  J90        Discussion:  75 year old female, bilateral effusion, right side smaller than the left.  Left side has recurrence with large left effusion.  Plan DC cardioversion by cardiology on 12/01/2020 and she is on Eliquis for this.  She will not be able to stop the anticoagulation.  Last thoracentesis revealed 75% lymphocytes with atypical mesothelial cells present.  Plan: Be here today to have a repeat left-sided thoracentesis. Patient is agreeable to proceed. She is on Eliquis we discussed risk benefits and alternatives of doing this follows anticoagulation. Patient is agreeable. Understand that she is at high risk for a bleeding complication but we will do this under ultrasound guidance and should be safe.  Please see separate procedure note documenting thoracentesis in the office. Please see orders for pleural fluid analysis above   Current Outpatient Medications:    amLODipine (NORVASC) 5 MG tablet, Take 1 tablet (5 mg total) by mouth daily., Disp: 90 tablet, Rfl: 1   apixaban  (ELIQUIS) 5 MG TABS tablet, Take 1 tablet (5 mg total) by mouth 2 (two) times daily., Disp: 60 tablet, Rfl: 5   atorvastatin (LIPITOR) 40 MG tablet, Take 40 mg by mouth daily., Disp: , Rfl:    Carboxymethylcellul-Glycerin (CLEAR EYES FOR DRY EYES) 1-0.25 % SOLN, Place 1 drop into both eyes daily., Disp: , Rfl:    Cholecalciferol (VITAMIN D3) 50 MCG (2000 UT) capsule, Take 2,000 Units by mouth daily., Disp: , Rfl:    escitalopram (LEXAPRO) 10 MG tablet, Take 1 tablet (10 mg total) by mouth daily., Disp: 90 tablet, Rfl: 0   famotidine (PEPCID) 40 MG tablet, Take 1 tablet (40 mg total) by mouth daily., Disp: 90 tablet, Rfl: 0   fentaNYL (DURAGESIC) 50 MCG/HR, Place 1 patch onto the skin every 3 (three) days., Disp: 10 patch, Rfl: 0   metoprolol tartrate (LOPRESSOR) 50 MG tablet, Take 1 tablet (50 mg total) by mouth 2 (two) times daily., Disp: 6 tablet, Rfl: 0   pregabalin (LYRICA) 150 MG capsule, Take 1 capsule (150 mg total) by mouth 2 (two) times daily., Disp: 180 capsule, Rfl: 3  [facility] 11/29/2020 7:22 PM

## 2020-11-29 NOTE — Patient Instructions (Signed)
Thank you for visiting Dr. Valeta Harms at [facility]. Today we recommend the following:  Orders Placed This Encounter  Procedures   DG Chest 2 View   Call with any issues    Please do your part to reduce the spread of COVID-19.

## 2020-11-29 NOTE — Progress Notes (Signed)
Synopsis: Referred in June 2022 for pleural effusion by Virginia Crews, MD  Subjective:   PATIENT ID: Dawn Bruce GENDER: female DOB: 10/13/1945, MRN: [id number]  Chief Complaint  Patient presents with   Procedure    Thorocentisis left side     This is a 75 year old female, past medical history of hyperlipidemia, hypertension, skin cancer, mother with lung cancer, father with bladder cancer.  Patient is a former smoker quit in 1995.  Patient was in [place] in April developed shortness of breath.  Came home and met with her cardiologist for evaluation.  CT scan of the chest revealed bilateral pleural effusion.  She was found to be in atrial fibrillation.  She was placed on rate control medications as well as apixaban.  Patient was referred for evaluation of bilateral pleural effusion and consideration for thoracentesis.  CT scan of the chest was completed on 09/28/2020 which revealed a moderate sized left effusion and a small right effusion.  She also has a right lung nodule.  OV 10/28/2020: Here today for planned office-based thoracentesis.  The patient has held her anticoagulation.Please see signed consent.  We discussed the risk benefits and alternatives before proceeding with thoracentesis today.  OV 11/17/2020: Here today for evaluation of recurrent shortness of breath and cough.  She had a thoracentesis in the office on 10/28/2020.  This revealed atypical mesothelial cells.There was no obvious evidence of malignancy.  Additionally there was 75% lymphocytes within the effusion.  She is however planned for DC cardioversion by cardiology on July 13.  And maintained on Eliquis at this time.  OV 11/29/2020: Patient here today for outpatient base left-sided thoracentesis.  Consent signed.  We discussed risk benefits and alternatives patient is agreeable to proceed.  Patient is on anticoagulation due to planned DCCV cardioversion this week.  We discussed the added risk of doing this procedure  while on anticoagulation.  Patient is agreeable to proceed.   Past Medical History:  Diagnosis Date   Actinic keratosis    Chronic pain    Hyperlipidemia    Hypertension    Squamous cell carcinoma of skin 09/10/2019   R dorsum hand and L wrist    Squamous cell carcinoma of skin 10/28/2019   R dorsum hand prox lateral    Squamous cell carcinoma of skin 10/28/2019   R dorsum hand prox medial    Squamous cell carcinoma of skin 10/28/2019   R dorsum hand distal lateral    Squamous cell carcinoma of skin 10/28/2019   R dorsum hand distal medial    Squamous cell carcinoma of skin 03/04/2020   SCC IS R medial infraorbital, LN2 04/28/20   Squamous cell carcinoma of skin 03/04/2020   R dorsal hand, EDC   Squamous cell carcinoma of skin 03/04/2020   SCC IS, L dorsum wrist, EDC     Family History  Problem Relation Age of Onset   Cancer Mother    Cancer Father    Breast cancer Neg Hx      Past Surgical History:  Procedure Laterality Date   ABDOMINAL HYSTERECTOMY     AUGMENTATION MAMMAPLASTY Bilateral    25-30 years ago   CAROTID ARTERY ANGIOPLASTY Right 1995(approximate)   CAROTID ENDARTERECTOMY  2001   COSMETIC SURGERY     ELBOW SURGERY     TONSILLECTOMY      Social History   Socioeconomic History   Marital status: Married    Spouse name: Not on file   Number of children: 3  Years of education: Not on file   Highest education level: Some college, no degree  Occupational History   Occupation: retired  Tobacco Use   Smoking status: Former    Packs/day: 0.50    Years: 5.00    Pack years: 2.50    Types: Cigarettes    Quit date: 1990    Years since quitting: 32.5   Smokeless tobacco: Never   Tobacco comments:    quit around 1990-1995 (?)  Vaping Use   Vaping Use: Never used  Substance and Sexual Activity   Alcohol use: Yes    Alcohol/week: 21.0 standard drinks    Types: 21 Shots of liquor per week    Comment: 3 scotch drinks a night   Drug use: Never    Sexual activity: Not on file  Other Topics Concern   Not on file  Social History Narrative   Not on file   Social Determinants of Health   Financial Resource Strain: Low Risk    Difficulty of Paying Living Expenses: Not hard at all  Food Insecurity: No Food Insecurity   Worried About Charity fundraiser in the Last Year: Never true   [place] in the Last Year: Never true  Transportation Needs: No Transportation Needs   Lack of Transportation (Medical): No   Lack of Transportation (Non-Medical): No  Physical Activity: Insufficiently Active   Days of Exercise per Week: 7 days   Minutes of Exercise per Session: 10 min  Stress: No Stress Concern Present   Feeling of Stress : Not at all  Social Connections: Moderately Isolated   Frequency of Communication with Friends and Family: More than three times a week   Frequency of Social Gatherings with Friends and Family: Twice a week   Attends Religious Services: Never   Marine scientist or Organizations: No   Attends Music therapist: Never   Marital Status: Married  Human resources officer Violence: Not At Risk   Fear of Current or Ex-Partner: No   Emotionally Abused: No   Physically Abused: No   Sexually Abused: No     No Known Allergies   Outpatient Medications Prior to Visit  Medication Sig Dispense Refill   amLODipine (NORVASC) 5 MG tablet Take 1 tablet (5 mg total) by mouth daily. 90 tablet 1   apixaban (ELIQUIS) 5 MG TABS tablet Take 1 tablet (5 mg total) by mouth 2 (two) times daily. 60 tablet 5   atorvastatin (LIPITOR) 40 MG tablet Take 40 mg by mouth daily.     Carboxymethylcellul-Glycerin (CLEAR EYES FOR DRY EYES) 1-0.25 % SOLN Place 1 drop into both eyes daily.     Cholecalciferol (VITAMIN D3) 50 MCG (2000 UT) capsule Take 2,000 Units by mouth daily.     escitalopram (LEXAPRO) 10 MG tablet Take 1 tablet (10 mg total) by mouth daily. 90 tablet 0   famotidine (PEPCID) 40 MG tablet Take 1 tablet (40 mg  total) by mouth daily. 90 tablet 0   fentaNYL (DURAGESIC) 50 MCG/HR Place 1 patch onto the skin every 3 (three) days. 10 patch 0   metoprolol tartrate (LOPRESSOR) 50 MG tablet Take 1 tablet (50 mg total) by mouth 2 (two) times daily. 6 tablet 0   pregabalin (LYRICA) 150 MG capsule Take 1 capsule (150 mg total) by mouth 2 (two) times daily. 180 capsule 3   No facility-administered medications prior to visit.    Review of Systems  Constitutional:  Negative for chills, fever,  malaise/fatigue and weight loss.  HENT:  Negative for hearing loss, sore throat and tinnitus.   Eyes:  Negative for blurred vision and double vision.  Respiratory:  Positive for cough and shortness of breath. Negative for hemoptysis, sputum production, wheezing and stridor.   Cardiovascular:  Negative for chest pain, palpitations, orthopnea, leg swelling and PND.  Gastrointestinal:  Negative for abdominal pain, constipation, diarrhea, heartburn, nausea and vomiting.  Genitourinary:  Negative for dysuria, hematuria and urgency.  Musculoskeletal:  Negative for joint pain and myalgias.  Skin:  Negative for itching and rash.  Neurological:  Negative for dizziness, tingling, weakness and headaches.  Endo/Heme/Allergies:  Negative for environmental allergies. Does not bruise/bleed easily.  Psychiatric/Behavioral:  Negative for depression. The patient is not nervous/anxious and does not have insomnia.   All other systems reviewed and are negative.   Objective:  Physical Exam Vitals reviewed.  Constitutional:      General: She is not in acute distress.    Appearance: She is well-developed.  HENT:     Head: Normocephalic and atraumatic.  Eyes:     General: No scleral icterus.    Conjunctiva/sclera: Conjunctivae normal.     Pupils: Pupils are equal, round, and reactive to light.  Neck:     Vascular: No JVD.     Trachea: No tracheal deviation.  Cardiovascular:     Rate and Rhythm: Normal rate and regular rhythm.      Heart sounds: Normal heart sounds. No murmur heard. Pulmonary:     Effort: Pulmonary effort is normal. No tachypnea, accessory muscle usage or respiratory distress.     Breath sounds: No stridor. No wheezing, rhonchi or rales.     Comments: Absent breath sounds in the bilateral bases, worse on the left compared to the right Abdominal:     General: Bowel sounds are normal. There is no distension.     Palpations: Abdomen is soft.     Tenderness: There is no abdominal tenderness.  Musculoskeletal:        General: No tenderness.     Cervical back: Neck supple.  Lymphadenopathy:     Cervical: No cervical adenopathy.  Skin:    General: Skin is warm and dry.     Capillary Refill: Capillary refill takes less than 2 seconds.     Findings: No rash.  Neurological:     Mental Status: She is alert and oriented to person, place, and time.  Psychiatric:        Behavior: Behavior normal.     Vitals:   11/29/20 1044  Pulse: 66  SpO2: 92%  Weight: 131 lb (59.4 kg)  Height: $Remove'5\' 3"'uIjrEvo$  (1.6 m)    92% on RA BMI Readings from Last 3 Encounters:  11/29/20 23.21 kg/m  11/17/20 22.82 kg/m  11/15/20 22.67 kg/m   Wt Readings from Last 3 Encounters:  11/29/20 131 lb (59.4 kg)  11/17/20 128 lb 12.8 oz (58.4 kg)  11/15/20 128 lb (58.1 kg)     CBC    Component Value Date/Time   WBC 6.5 11/19/2020 1154   RBC 5.47 (H) 11/19/2020 1154   HGB 15.3 (H) 11/19/2020 1154   HGB 14.7 01/02/2019 1402   HCT 46.8 (H) 11/19/2020 1154   HCT 43.9 01/02/2019 1402   PLT 183 11/19/2020 1154   PLT 179 01/02/2019 1402   MCV 85.6 11/19/2020 1154   MCV 89 01/02/2019 1402   MCH 28.0 11/19/2020 1154   MCHC 32.7 11/19/2020 1154   RDW 15.2 11/19/2020 1154  RDW 13.9 01/02/2019 1402   LYMPHSABS 1.0 01/02/2019 1402   EOSABS 0.2 01/02/2019 1402   BASOSABS 0.0 01/02/2019 1402    Chest Imaging: CT chest 09/28/2020: Moderate left-sided effusion, small right-sided effusion, right pulmonary nodule 6 mm along the  oblique fissure The patient's images have been independently reviewed by me.    Pulmonary Functions Testing Results: No flowsheet data found.  FeNO:   Pathology:   Echocardiogram:   Heart Catheterization:     Assessment & Plan:     ICD-10-CM   1. Recurrent left pleural effusion  J90 DG Chest 2 View    Body fluid cell count with differential    Body fluid culture    Cytology - Non PAP;    Glucose, Body Fluid Other    Lactate dehydrogenase (pleural or peritoneal fluid)    Protein, pleural or peritoneal fluid    Protein, pleural or peritoneal fluid    Lactate dehydrogenase (pleural or peritoneal fluid)    Glucose, Body Fluid Other    Body fluid culture    Body fluid cell count with differential    2. Stage 3a chronic kidney disease (HCC)  N18.31     3. Chronic diastolic heart failure (HCC)  I50.32     4. On continuous oral anticoagulation  Z79.01     5. Bilateral pleural effusion  J90        Discussion:  75 year old female, bilateral effusion, right side smaller than the left.  Left side has recurrence with large left effusion.  Plan DC cardioversion by cardiology on 12/01/2020 and she is on Eliquis for this.  She will not be able to stop the anticoagulation.  Last thoracentesis revealed 75% lymphocytes with atypical mesothelial cells present.  Plan: Be here today to have a repeat left-sided thoracentesis. Patient is agreeable to proceed. She is on Eliquis we discussed risk benefits and alternatives of doing this follows anticoagulation. Patient is agreeable. Understand that she is at high risk for a bleeding complication but we will do this under ultrasound guidance and should be safe.  Please see separate procedure note documenting thoracentesis in the office. Please see orders for pleural fluid analysis above   Current Outpatient Medications:    amLODipine (NORVASC) 5 MG tablet, Take 1 tablet (5 mg total) by mouth daily., Disp: 90 tablet, Rfl: 1   apixaban  (ELIQUIS) 5 MG TABS tablet, Take 1 tablet (5 mg total) by mouth 2 (two) times daily., Disp: 60 tablet, Rfl: 5   atorvastatin (LIPITOR) 40 MG tablet, Take 40 mg by mouth daily., Disp: , Rfl:    Carboxymethylcellul-Glycerin (CLEAR EYES FOR DRY EYES) 1-0.25 % SOLN, Place 1 drop into both eyes daily., Disp: , Rfl:    Cholecalciferol (VITAMIN D3) 50 MCG (2000 UT) capsule, Take 2,000 Units by mouth daily., Disp: , Rfl:    escitalopram (LEXAPRO) 10 MG tablet, Take 1 tablet (10 mg total) by mouth daily., Disp: 90 tablet, Rfl: 0   famotidine (PEPCID) 40 MG tablet, Take 1 tablet (40 mg total) by mouth daily., Disp: 90 tablet, Rfl: 0   fentaNYL (DURAGESIC) 50 MCG/HR, Place 1 patch onto the skin every 3 (three) days., Disp: 10 patch, Rfl: 0   metoprolol tartrate (LOPRESSOR) 50 MG tablet, Take 1 tablet (50 mg total) by mouth 2 (two) times daily., Disp: 6 tablet, Rfl: 0   pregabalin (LYRICA) 150 MG capsule, Take 1 capsule (150 mg total) by mouth 2 (two) times daily., Disp: 180 capsule, Rfl: 3  [facility] 11/29/2020 7:22 PM

## 2020-11-29 NOTE — Progress Notes (Signed)
Thoracentesis  Procedure Note  Chrisandra Wiemers  [id number]  09/02/1945  Date:11/29/20  Time:8:49 AM   Provider Performing:Quinteria Chisum L Lavon Horn   Procedure: Thoracentesis with imaging guidance (96116)  Indication(s) Pleural Effusion  Consent Risks of the procedure as well as the alternatives and risks of each were explained to the patient and/or caregiver.  Consent for the procedure was obtained and is signed in the bedside chart  Anesthesia Topical only with 1% lidocaine    Time Out Verified patient identification, verified procedure, site/side was marked, verified correct patient position, special equipment/implants available, medications/allergies/relevant history reviewed, required imaging and test results available.   Sterile Technique Maximal sterile technique including full sterile barrier drape, hand hygiene, sterile gown, sterile gloves, mask, hair covering, sterile ultrasound probe cover (if used).  Procedure Description Ultrasound was used to identify appropriate pleural anatomy for placement and overlying skin marked.  Area of drainage cleaned and draped in sterile fashion. Lidocaine was used to anesthetize the skin and subcutaneous tissue.  1400 cc's of amber appearing fluid was drained from the left pleural space. Catheter then removed and bandaid applied to site.   Complications/Tolerance None; patient tolerated the procedure well. Chest X-ray is ordered to confirm no post-procedural complication.   EBL 0 cc   Specimen(s) Pleural fluid Please see pleural fluid study orders.  Garner Nash, DO [facility] 11/29/2020 7:22 PM       US imaging of the left chest: Left pleural effusion

## 2020-11-30 LAB — GRAM STAIN

## 2020-11-30 LAB — CYTOLOGY - NON PAP

## 2020-12-01 ENCOUNTER — Ambulatory Visit
Admission: RE | Admit: 2020-12-01 | Discharge: 2020-12-01 | Disposition: A | Discharge status: Home / Self Care | Payer: Medicare Other | Attending: Cardiology | Admitting: Cardiology

## 2020-12-01 ENCOUNTER — Other Ambulatory Visit: Payer: Self-pay | Admitting: *Deleted

## 2020-12-01 ENCOUNTER — Encounter
Admission: RE | Disposition: A | Discharge status: Home / Self Care | Payer: Self-pay | Source: Home / Self Care | Attending: Cardiology

## 2020-12-01 ENCOUNTER — Ambulatory Visit: Payer: Medicare Other | Admitting: Anesthesiology

## 2020-12-01 ENCOUNTER — Encounter: Payer: Self-pay | Admitting: Cardiology

## 2020-12-01 DIAGNOSIS — E785 Hyperlipidemia, unspecified: Secondary | ICD-10-CM | POA: Diagnosis not present

## 2020-12-01 DIAGNOSIS — Z87891 Personal history of nicotine dependence: Secondary | ICD-10-CM | POA: Insufficient documentation

## 2020-12-01 DIAGNOSIS — I10 Essential (primary) hypertension: Secondary | ICD-10-CM | POA: Diagnosis not present

## 2020-12-01 DIAGNOSIS — Z7901 Long term (current) use of anticoagulants: Secondary | ICD-10-CM | POA: Insufficient documentation

## 2020-12-01 DIAGNOSIS — J90 Pleural effusion, not elsewhere classified: Secondary | ICD-10-CM | POA: Insufficient documentation

## 2020-12-01 DIAGNOSIS — I4819 Other persistent atrial fibrillation: Secondary | ICD-10-CM

## 2020-12-01 DIAGNOSIS — Z79899 Other long term (current) drug therapy: Secondary | ICD-10-CM | POA: Diagnosis not present

## 2020-12-01 DIAGNOSIS — I4891 Unspecified atrial fibrillation: Secondary | ICD-10-CM | POA: Diagnosis not present

## 2020-12-01 HISTORY — PX: CARDIOVERSION: SHX1299

## 2020-12-01 LAB — GLUCOSE, BODY FLUID OTHER: Glucose, Body Fluid Other: 122 mg/dL

## 2020-12-01 SURGERY — CARDIOVERSION
Anesthesia: General

## 2020-12-01 MED ORDER — SODIUM CHLORIDE 0.9 % IV SOLN
INTRAVENOUS | Status: DC
  Administered 2020-12-01: 1000 mL via INTRAVENOUS

## 2020-12-01 MED ORDER — PROPOFOL 10 MG/ML IV BOLUS
INTRAVENOUS | Status: DC | PRN
  Administered 2020-12-01: 80 mg via INTRAVENOUS

## 2020-12-01 MED ORDER — PROPOFOL 500 MG/50ML IV EMUL
INTRAVENOUS | Status: AC
  Filled 2020-12-01: qty 50

## 2020-12-01 MED ORDER — METOPROLOL TARTRATE 25 MG PO TABS: 25.0000 mg | ORAL_TABLET | Freq: Two times a day (BID) | ORAL | 1 refills | Status: DC

## 2020-12-01 MED ORDER — METOPROLOL TARTRATE 50 MG PO TABS: 25.0000 mg | ORAL_TABLET | Freq: Two times a day (BID) | ORAL | 3 refills | Status: DC

## 2020-12-01 NOTE — Procedures (Signed)
Cardioversion procedure note For atrial fibrillation.  Procedure Details:  Consent: Risks of procedure as well as the alternatives and risks of each were explained to the (patient/caregiver).  Consent for procedure obtained.  Time Out: Verified patient identification, verified procedure, site/side was marked, verified correct patient position, special equipment/implants available, medications/allergies/relevent history reviewed, required imaging and test results available.  Performed  Patient placed on cardiac monitor, pulse oximetry, supplemental oxygen as necessary.   Sedation given: propofol IV, per anesthesia team Pacer pads placed anterior and posterior chest.   Cardioverted 1 time(s).   Cardioverted at  [place]. Synchronized biphasic Converted to NSR   Evaluation: Findings: Post procedure EKG shows: NSR Complications: None Patient did tolerate procedure well.  Time Spent Directly with the Patient:  35 minutes   Kate Sable, M.D.

## 2020-12-01 NOTE — Anesthesia Postprocedure Evaluation (Signed)
Anesthesia Post Note  Patient: Dawn Bruce  Procedure(s) Performed: CARDIOVERSION  Patient location during evaluation: Specials Recovery (specials recovery) Anesthesia Type: General Level of consciousness: awake and alert and oriented Pain management: pain level controlled Vital Signs Assessment: post-procedure vital signs reviewed and stable Respiratory status: spontaneous breathing, nonlabored ventilation and respiratory function stable Cardiovascular status: blood pressure returned to baseline and stable Postop Assessment: no signs of nausea or vomiting Anesthetic complications: no   No notable events documented.   Last Vitals:  Vitals:   12/01/20 0800 12/01/20 0801  BP: 99/62 99/62  Pulse: 60 60  Resp: 19 16  Temp:    SpO2: 97% 95%    Last Pain:  Vitals:   12/01/20 0710  TempSrc: Oral  PainSc: 0-No pain                 Emrik Erhard

## 2020-12-01 NOTE — Anesthesia Preprocedure Evaluation (Addendum)
Anesthesia Evaluation  Patient identified by MRN, date of birth, ID band Patient awake    Reviewed: Allergy & Precautions, NPO status , Patient's Chart, lab work & pertinent test results  History of Anesthesia Complications Negative for: history of anesthetic complications  Airway Mallampati: II  TM Distance: >3 FB Neck ROM: Full    Dental  (+) Poor Dentition   Pulmonary neg sleep apnea, neg COPD, former smoker,    breath sounds clear to auscultation- rhonchi (-) wheezing      Cardiovascular Exercise Tolerance: Good hypertension, Pt. on medications (-) CAD, (-) Past MI, (-) Cardiac Stents and (-) CABG + dysrhythmias Atrial Fibrillation  Rhythm:Irregular Rate:Normal - Systolic murmurs and - Diastolic murmurs    Neuro/Psych neg Seizures PSYCHIATRIC DISORDERS Anxiety negative neurological ROS     GI/Hepatic Neg liver ROS, GERD  ,  Endo/Other  negative endocrine ROSneg diabetes  Renal/GU CRFRenal disease     Musculoskeletal negative musculoskeletal ROS (+)   Abdominal (+) - obese,   Peds  Hematology negative hematology ROS (+)   Anesthesia Other Findings Past Medical History: No date: Actinic keratosis No date: Chronic pain No date: Hyperlipidemia No date: Hypertension 09/10/2019: Squamous cell carcinoma of skin     Comment:  R dorsum hand and L wrist  10/28/2019: Squamous cell carcinoma of skin     Comment:  R dorsum hand prox lateral  10/28/2019: Squamous cell carcinoma of skin     Comment:  R dorsum hand prox medial  10/28/2019: Squamous cell carcinoma of skin     Comment:  R dorsum hand distal lateral  10/28/2019: Squamous cell carcinoma of skin     Comment:  R dorsum hand distal medial  03/04/2020: Squamous cell carcinoma of skin     Comment:  SCC IS R medial infraorbital, LN2 04/28/20 03/04/2020: Squamous cell carcinoma of skin     Comment:  R dorsal hand, EDC 03/04/2020: Squamous cell carcinoma of  skin     Comment:  SCC IS, L dorsum wrist, EDC   Reproductive/Obstetrics                            Anesthesia Physical Anesthesia Plan  ASA: 3  Anesthesia Plan: General   Post-op Pain Management:    Induction: Intravenous  PONV Risk Score and Plan: 2 and Propofol infusion  Airway Management Planned: Natural Airway  Additional Equipment:   Intra-op Plan:   Post-operative Plan:   Informed Consent: I have reviewed the patients History and Physical, chart, labs and discussed the procedure including the risks, benefits and alternatives for the proposed anesthesia with the patient or authorized representative who has indicated his/her understanding and acceptance.     Dental advisory given  Plan Discussed with: CRNA and Anesthesiologist  Anesthesia Plan Comments:         Anesthesia Quick Evaluation

## 2020-12-01 NOTE — Progress Notes (Signed)
Please let her know that the pleural fluid reveals lymphocytes.  This was what was present on her last pleural fluid analysis.  I am concerned that she has a recurrent lymphocyte predominant effusion.  If we are unable to determine the etiology of this or the effusion recurs again then we may need to consider repeat drainage and evaluation by thoracic surgery.  Please schedule patient with me in follow-up with a repeat two-view chest x-ray prior to appointment in August.  If her shortness of breath or symptoms occur or get worse before then she needs to let us know so we can get images sooner.  And I will work her in for a thoracentesis as soon as possible if it does recur.  Thanks,  BLI  Garner Nash, DO [facility] 12/01/2020 4:55 PM

## 2020-12-01 NOTE — Interval H&P Note (Signed)
History and Physical Interval Note:  12/01/2020 8:00 AM  [name]  has presented today for surgery, with the diagnosis of  Afib.  The various methods of treatment have been discussed with the patient and family. After consideration of risks, benefits and other options for treatment, the patient has consented to  Procedure(s): CARDIOVERSION (N/A) as a surgical intervention.  The patient's history has been reviewed, patient examined, no change in status, stable for surgery.  I have reviewed the patient's chart and labs.  Questions were answered to the patient's satisfaction.     Aaron Edelman [place]

## 2020-12-01 NOTE — Transfer of Care (Signed)
Immediate Anesthesia Transfer of Care Note  Patient: Dawn Bruce  Procedure(s) Performed: CARDIOVERSION  Patient Location: PACU and Nursing Unit  Anesthesia Type:General  Level of Consciousness: drowsy and patient cooperative  Airway & Oxygen Therapy: Patient Spontanous Breathing and Patient connected to nasal cannula oxygen  Post-op Assessment: Report given to RN and Post -op Vital signs reviewed and stable  Post vital signs: Reviewed and stable  Last Vitals:  Vitals Value Taken Time  BP 99/62 12/01/20 0801  Temp    Pulse 58 12/01/20 0801  Resp 17 12/01/20 0801  SpO2 97 % 12/01/20 0801  Vitals shown include unvalidated device data.  Last Pain:  Vitals:   12/01/20 0710  TempSrc: Oral  PainSc: 0-No pain         Complications: No notable events documented.

## 2020-12-03 ENCOUNTER — Encounter: Payer: Self-pay | Admitting: Family Medicine

## 2020-12-03 ENCOUNTER — Other Ambulatory Visit: Payer: Self-pay

## 2020-12-03 ENCOUNTER — Ambulatory Visit (INDEPENDENT_AMBULATORY_CARE_PROVIDER_SITE_OTHER): Payer: Medicare Other | Admitting: Family Medicine

## 2020-12-03 VITALS — BP 117/79 | HR 63 | Temp 98.1°F | Resp 16 | Wt 128.0 lb

## 2020-12-03 DIAGNOSIS — R7303 Prediabetes: Secondary | ICD-10-CM

## 2020-12-03 DIAGNOSIS — M79632 Pain in left forearm: Secondary | ICD-10-CM

## 2020-12-03 DIAGNOSIS — G894 Chronic pain syndrome: Secondary | ICD-10-CM

## 2020-12-03 DIAGNOSIS — N1832 Chronic kidney disease, stage 3b: Secondary | ICD-10-CM

## 2020-12-03 DIAGNOSIS — M792 Neuralgia and neuritis, unspecified: Secondary | ICD-10-CM

## 2020-12-03 DIAGNOSIS — E782 Mixed hyperlipidemia: Secondary | ICD-10-CM | POA: Diagnosis not present

## 2020-12-03 DIAGNOSIS — D692 Other nonthrombocytopenic purpura: Secondary | ICD-10-CM | POA: Insufficient documentation

## 2020-12-03 DIAGNOSIS — I4819 Other persistent atrial fibrillation: Secondary | ICD-10-CM

## 2020-12-03 DIAGNOSIS — I10 Essential (primary) hypertension: Secondary | ICD-10-CM

## 2020-12-03 DIAGNOSIS — Z79891 Long term (current) use of opiate analgesic: Secondary | ICD-10-CM

## 2020-12-03 LAB — POCT GLYCOSYLATED HEMOGLOBIN (HGB A1C): Hemoglobin A1C: 6 % — AB (ref 4.0–5.6)

## 2020-12-03 MED ORDER — FENTANYL 50 MCG/HR TD PT72: 1.0000 | MEDICATED_PATCH | TRANSDERMAL | 0 refills | Status: DC

## 2020-12-03 NOTE — Assessment & Plan Note (Signed)
Previously well controlled Continue statin Repeat FLP and CMP at next visit Goal LDL < 70  

## 2020-12-03 NOTE — Progress Notes (Signed)
Established patient visit   Patient: Dawn Bruce   DOB: [birth year]   75 y.o. Female  MRN: [id number] Visit Date: 12/03/2020  Today's healthcare provider: Lavon Paganini, MD   Chief Complaint  Patient presents with   Hypertension   Subjective    Hypertension Associated symptoms include palpitations and shortness of breath. Pertinent negatives include no chest pain, headaches or neck pain.  Hyperlipidemia Associated symptoms include shortness of breath. Pertinent negatives include no chest pain or myalgias.   Hypertension, follow-up  BP Readings from Last 3 Encounters:  12/01/20 (!) 116/59  11/17/20 132/64  11/15/20 126/72   Wt Readings from Last 3 Encounters:  12/01/20 128 lb (58.1 kg)  11/29/20 131 lb (59.4 kg)  11/17/20 128 lb 12.8 oz (58.4 kg)     She was last seen for hypertension 6 months ago.  BP at that visit was 125/85. Management since that visit includes Continue current medications.  She reports excellent compliance with treatment. She is not having side effects.  She is following a Regular diet. She is exercising. She does not smoke.  Use of agents associated with hypertension: none.   Outside blood pressures are stable at 117/27. Symptoms: No chest pain No chest pressure  Yes palpitations No syncope  No dyspnea No orthopnea  No paroxysmal nocturnal dyspnea No lower extremity edema   Pertinent labs: Lab Results  Component Value Date   CHOL 178 04/20/2020   HDL 61 04/20/2020   LDLCALC 102 (H) 04/20/2020   TRIG 79 04/20/2020   CHOLHDL 2.9 04/20/2020   Lab Results  Component Value Date   NA 141 11/19/2020   K 4.8 11/19/2020   CREATININE 1.53 (H) 11/19/2020   GFRNONAA 35 (L) 11/19/2020   GFRAA 54 (L) 10/08/2019   GLUCOSE 121 (H) 11/19/2020     The 10-year ASCVD risk score Mikey Bussing DC Jr., et al., 2013) is: 19.5%   --------------------------------------------------------------------------------------------------- Lipid/Cholesterol,  Follow-up  Last lipid panel Other pertinent labs  Lab Results  Component Value Date   CHOL 178 04/20/2020   HDL 61 04/20/2020   LDLCALC 102 (H) 04/20/2020   TRIG 79 04/20/2020   CHOLHDL 2.9 04/20/2020   Lab Results  Component Value Date   ALT 12 04/20/2020   AST 24 04/20/2020   PLT 183 11/19/2020   TSH 2.68 01/03/2018   TSH 2.68 01/03/2018     She was last seen for this 6 months ago.  Management since that visit includes no changes.  She reports excellent compliance with treatment. She is not having side effects.   Symptoms: No chest pain No chest pressure/discomfort  Yes dyspnea No lower extremity edema  No numbness or tingling of extremity No orthopnea  Yes palpitations No paroxysmal nocturnal dyspnea  No speech difficulty No syncope   Current diet: in general, a "healthy" diet   Current exercise: walking  The 10-year ASCVD risk score Mikey Bussing DC Jr., et al., 2013) is: 19.5%  --------------------------------------------------------------------------------------------------- Prediabetes, Follow-up  Lab Results  Component Value Date   HGBA1C 5.9 (H) 04/20/2020   HGBA1C 6.0 (H) 10/08/2019   GLUCOSE 121 (H) 11/19/2020   GLUCOSE 98 10/13/2020   GLUCOSE 121 (H) 10/04/2020    Last seen for for this7 months ago.  Management since that visit includes no changes. Current symptoms include none and have been stable.  Prior visit with dietician: no Current diet: in general, a "healthy" diet   Current exercise: walking  Pertinent Labs:    Component Value Date/Time  CHOL 178 04/20/2020 1121   TRIG 79 04/20/2020 1121   CHOLHDL 2.9 04/20/2020 1121   CREATININE 1.53 (H) 11/19/2020 1154    Wt Readings from Last 3 Encounters:  12/01/20 128 lb (58.1 kg)  11/29/20 131 lb (59.4 kg)  11/17/20 128 lb 12.8 oz (58.4 kg)   Pleural Effusion  She had a pleural effusion 2x ad she reports she has since recovered well.  Atrial Fibrillation  She was in atrial fibrillation and  received a cardioversion 2 days ago. Her cardiologist, Dr. Garen Lah monitors her heart condition.   Neuropathy  She has nerve pain on her big metatarsal and she believes the 150 mg lyrica resolves the pain.  -----------------------------------------------------------------------------------------   Patient Active Problem List   Diagnosis Date Noted   Persistent atrial fibrillation ([place])    Prediabetes 10/08/2019   Snoring 10/08/2019   Stenosis of right carotid artery 10/03/2018   Hyperlipidemia 10/03/2018   GERD (gastroesophageal reflux disease) 10/03/2018   GAD (generalized anxiety disorder) 10/03/2018   Essential hypertension 10/02/2018   CKD (chronic kidney disease) stage 3, GFR 30-59 ml/min (HCC) 06/17/2018   Chronic forearm pain (Primary Area of Pain) (Left) 06/17/2018   Neurogenic pain 06/17/2018   Chronic pain syndrome 06/06/2018   Long term current use of opiate analgesic 06/06/2018   Social History   Tobacco Use   Smoking status: Former    Packs/day: 0.50    Years: 5.00    Pack years: 2.50    Types: Cigarettes    Quit date: 1990    Years since quitting: 32.5   Smokeless tobacco: Never   Tobacco comments:    quit around 1990-1995 (?)  Vaping Use   Vaping Use: Never used  Substance Use Topics   Alcohol use: Yes    Alcohol/week: 21.0 standard drinks    Types: 21 Shots of liquor per week    Comment: 3 scotch drinks a night   Drug use: Never   No Known Allergies     Medications: Outpatient Medications Prior to Visit  Medication Sig   amLODipine (NORVASC) 5 MG tablet Take 1 tablet (5 mg total) by mouth daily.   apixaban (ELIQUIS) 5 MG TABS tablet Take 1 tablet (5 mg total) by mouth 2 (two) times daily.   atorvastatin (LIPITOR) 40 MG tablet Take 40 mg by mouth daily.   Carboxymethylcellul-Glycerin (CLEAR EYES FOR DRY EYES) 1-0.25 % SOLN Place 1 drop into both eyes daily.   Cholecalciferol (VITAMIN D3) 50 MCG (2000 UT) capsule Take 2,000 Units by mouth  daily.   escitalopram (LEXAPRO) 10 MG tablet Take 1 tablet (10 mg total) by mouth daily.   famotidine (PEPCID) 40 MG tablet Take 1 tablet (40 mg total) by mouth daily.   fentaNYL (DURAGESIC) 50 MCG/HR Place 1 patch onto the skin every 3 (three) days.   metoprolol tartrate (LOPRESSOR) 25 MG tablet Take 1 tablet (25 mg total) by mouth 2 (two) times daily.   pregabalin (LYRICA) 150 MG capsule Take 1 capsule (150 mg total) by mouth 2 (two) times daily.   No facility-administered medications prior to visit.    Review of Systems  Constitutional:  Negative for activity change, appetite change, chills, fatigue and fever.  HENT:  Negative for ear pain, sinus pressure, sinus pain and sore throat.   Eyes:  Negative for pain and visual disturbance.  Respiratory:  Positive for shortness of breath. Negative for cough, chest tightness and wheezing.   Cardiovascular:  Positive for palpitations. Negative for chest pain  and leg swelling.  Gastrointestinal:  Negative for abdominal pain, blood in stool, diarrhea, nausea and vomiting.  Genitourinary:  Negative for flank pain, frequency, pelvic pain and urgency.  Musculoskeletal:  Negative for back pain, myalgias and neck pain.  Neurological:  Negative for dizziness, weakness, light-headedness, numbness and headaches.       Objective    There were no vitals taken for this visit. BP Readings from Last 3 Encounters:  12/01/20 (!) 116/59  11/17/20 132/64  11/15/20 126/72    Wt Readings from Last 3 Encounters:  12/01/20 128 lb (58.1 kg)  11/29/20 131 lb (59.4 kg)  11/17/20 128 lb 12.8 oz (58.4 kg)       Physical Exam Vitals reviewed.  Constitutional:      General: She is not in acute distress.    Appearance: Normal appearance. She is well-developed. She is not diaphoretic.  HENT:     Head: Normocephalic and atraumatic.  Eyes:     General: No scleral icterus.    Conjunctiva/sclera: Conjunctivae normal.  Neck:     Thyroid: No thyromegaly.   Cardiovascular:     Rate and Rhythm: Normal rate and regular rhythm.     Pulses: Normal pulses.     Heart sounds: Normal heart sounds. No murmur heard. Pulmonary:     Effort: Pulmonary effort is normal. No respiratory distress.     Breath sounds: Normal breath sounds. No wheezing, rhonchi or rales.  Musculoskeletal:     Cervical back: Neck supple.     Right lower leg: No edema.     Left lower leg: No edema.  Lymphadenopathy:     Cervical: No cervical adenopathy.  Skin:    General: Skin is warm and dry.     Findings: No rash.  Neurological:     Mental Status: She is alert and oriented to person, place, and time. Mental status is at baseline.  Psychiatric:        Mood and Affect: Mood normal.        Behavior: Behavior normal.    No results found for any visits on 12/03/20.  Assessment & Plan     Problem List Items Addressed This Visit       Cardiovascular and Mediastinum   Essential hypertension - Primary    Well controlled Continue current medications Reviewed recent metabolic panel F/u in 6 months        Persistent atrial fibrillation ([place])    Followed by cardiology Reviewed last labs       Senile purpura ([place])    Chronic and stable Continue to monitor          Genitourinary   CKD (chronic kidney disease) stage 3, GFR 30-59 ml/min ([place])    follwoed by nephrology Stable on last labs Avoid nephrotoxic meds         Other   Chronic pain syndrome (Chronic)    Chronic and stable Continue fentayl patch and lyrica       Relevant Medications   fentaNYL (DURAGESIC) 50 MCG/HR   Long term current use of opiate analgesic (Chronic)   Chronic forearm pain (Primary Area of Pain) (Left) (Chronic)    Related to previous nerve injury Chronic and stable Has pain contract Continue fentanyl and lyrica       Neurogenic pain (Chronic)   Hyperlipidemia    Previously well controlled Continue statin Repeat FLP and CMP at next visit Goal LDL < 70        Prediabetes  Recommend low carb diet stable       Relevant Orders   POCT glycosylated hemoglobin (Hb A1C) (Completed)     Return in about 5 months (around 05/05/2021) for CPE, AWV, as scheduled.      I,Essence Turner,acting as a Education administrator for Lavon Paganini, MD.,have documented all relevant documentation on the behalf of Lavon Paganini, MD,as directed by  Lavon Paganini, MD while in the presence of Lavon Paganini, MD.  I, Lavon Paganini, MD, have reviewed all documentation for this visit. The documentation on 12/03/20 for the exam, diagnosis, procedures, and orders are all accurate and complete.   Dari Carpenito, Dionne Bucy, MD, MPH [facility]

## 2020-12-03 NOTE — Assessment & Plan Note (Signed)
Chronic and stable Continue to monitor 

## 2020-12-03 NOTE — Assessment & Plan Note (Signed)
Chronic and stable Continue fentayl patch and lyrica

## 2020-12-03 NOTE — Assessment & Plan Note (Signed)
Followed by cardiology Reviewed last labs

## 2020-12-03 NOTE — Assessment & Plan Note (Addendum)
Recommend low carb diet stable

## 2020-12-03 NOTE — Assessment & Plan Note (Signed)
Well controlled Continue current medications Reviewed recent metabolic panel F/u in 6 months  

## 2020-12-03 NOTE — Assessment & Plan Note (Signed)
Related to previous nerve injury Chronic and stable Has pain contract Continue fentanyl and lyrica

## 2020-12-03 NOTE — Assessment & Plan Note (Signed)
follwoed by nephrology Stable on last labs Avoid nephrotoxic meds

## 2020-12-04 LAB — CULTURE, BODY FLUID W GRAM STAIN -BOTTLE: Culture: NO GROWTH

## 2020-12-06 NOTE — Telephone Encounter (Signed)
Dr. Valeta Harms will you please advise on the following My Chart message:      Noelly, Lasseigne, DO 3 hours ago (12:23 PM)      Yes.     You  [name] 5 hours ago (11:02 AM)      Good morning Stanton Kidney, Are you referring to the results of the culture? I w     [name]  Icard, Octavio Graves, DO Yesterday (9:12 AM)    Thank you

## 2020-12-10 NOTE — Telephone Encounter (Signed)
Dr. Valeta Harms, I called and made an f/u appt with the patient with you on 01/19/21 but she is requesting to see you sooner. You are booked up until then but if you like to double book, then we can do that. Patient is not having any symptoms, just wanting to see you sooner. Please advise. Thanks!

## 2020-12-13 ENCOUNTER — Encounter: Payer: Self-pay | Admitting: Family Medicine

## 2020-12-16 ENCOUNTER — Other Ambulatory Visit: Payer: Self-pay

## 2020-12-16 ENCOUNTER — Ambulatory Visit: Payer: Medicare Other

## 2020-12-16 ENCOUNTER — Ambulatory Visit (INDEPENDENT_AMBULATORY_CARE_PROVIDER_SITE_OTHER): Payer: Medicare Other | Admitting: Pulmonary Disease

## 2020-12-16 DIAGNOSIS — J90 Pleural effusion, not elsewhere classified: Secondary | ICD-10-CM | POA: Diagnosis not present

## 2020-12-16 NOTE — Progress Notes (Signed)
Virtual Visit via Telephone Note  I connected with Dawn Bruce on 12/16/20 at 10:30 AM EDT by telephone and verified that I am speaking with the correct person using two identifiers.  Location: Patient: home  Provider: office    I discussed the limitations, risks, security and privacy concerns of performing an evaluation and management service by telephone and the availability of in person appointments. I also discussed with the patient that there may be a patient responsible charge related to this service. The patient expressed understanding and agreed to proceed.  History of Present Illness:  This is a 75 year old female, past medical history of hyperlipidemia, hypertension, skin cancer, mother with lung cancer, father with bladder cancer.  Patient is a former smoker quit in 1995.  Patient was in [place] in April developed shortness of breath.  Came home and met with her cardiologist for evaluation.  CT scan of the chest revealed bilateral pleural effusion.  She was found to be in atrial fibrillation.  She was placed on rate control medications as well as apixaban.  Patient was referred for evaluation of bilateral pleural effusion and consideration for thoracentesis.  CT scan of the chest was completed on 09/28/2020 which revealed a moderate sized left effusion and a small right effusion.  She also has a right lung nodule.   OV 10/28/2020: Here today for planned office-based thoracentesis.  The patient has held her anticoagulation.Please see signed consent.  We discussed the risk benefits and alternatives before proceeding with thoracentesis today.   OV 11/17/2020: Here today for evaluation of recurrent shortness of breath and cough.  She had a thoracentesis in the office on 10/28/2020.  This revealed atypical mesothelial cells.There was no obvious evidence of malignancy.  Additionally there was 75% lymphocytes within the effusion.  She is however planned for DC cardioversion by cardiology on July 13.   And maintained on Eliquis at this time.   OV 12/16/2020: Telephone visit today.  Patient is feeling like her effusions are returning again.  She is also anxious about what is causing them.  I explained and reviewed her recent lab results again.  Cytology negative x2.  She still has a lymphocyte predominant pleural effusion that is recurrent on the left side.  Observations/Objective: Able to speak in complete sentences, sounds in no distress  Assessment and Plan: Recurrent left-sided pleural effusion Lymphocyte predominant On continuous oral anticoagulation Chronic diastolic heart failure Stage III CKD. - I am not exactly sure what is causing her recurrent effusion.  I am worried that it is lymphocyte predominant and unilateral on the left side.  No other history of malignancy and her cytology has been negative x2.  If this continues to reaccumulate within the left chest we may need to consider other options such as indwelling pleural catheter placement to prevent the need from repeat thoracentesis on continuous AC.  Also if we are unable to identify the cause may need to consider thoracic surgery involvement for pleural-based biopsies. Plan: Repeat two-view chest x-ray to assess for effusion. Patient lives in [place] and will have this done at [facility]. We will let her know what the chest x-ray looks like. At that point we will decide whether or not we will repeat thoracentesis versus IPC placement. Also if the effusion is recurrent will discuss case with thoracic surgery.  Follow Up Instructions:    I discussed the assessment and treatment plan with the patient. The patient was provided an opportunity to ask questions and all were answered. The patient  agreed with the plan and demonstrated an understanding of the instructions.   The patient was advised to call back or seek an in-person evaluation if the symptoms worsen or if the condition fails to improve as anticipated.  I provided 12  minutes of non-face-to-face time during this encounter.   Garner Nash, DO

## 2020-12-16 NOTE — Patient Instructions (Signed)
Thank you for visiting Dr. Valeta Harms at [facility]. Today we recommend the following:  Orders Placed This Encounter  Procedures   DG Chest 2 View   DG Chest 2 View   Chest x-ray Make plans after this for recurrent effusion. May need a repeat thoracentesis or consideration for IPC placement    Please do your part to reduce the spread of COVID-19.

## 2020-12-21 ENCOUNTER — Encounter: Payer: Self-pay | Admitting: Family Medicine

## 2020-12-21 ENCOUNTER — Ambulatory Visit
Admission: RE | Admit: 2020-12-21 | Discharge: 2020-12-21 | Disposition: A | Discharge status: Home / Self Care | Payer: Medicare Other | Attending: Pulmonary Disease | Admitting: Pulmonary Disease

## 2020-12-21 ENCOUNTER — Ambulatory Visit: Payer: Medicare Other | Admitting: Family Medicine

## 2020-12-21 ENCOUNTER — Ambulatory Visit
Admission: RE | Admit: 2020-12-21 | Discharge: 2020-12-21 | Disposition: A | Discharge status: Home / Self Care | Payer: Medicare Other | Source: Ambulatory Visit | Attending: Pulmonary Disease | Admitting: Pulmonary Disease

## 2020-12-21 DIAGNOSIS — J90 Pleural effusion, not elsewhere classified: Secondary | ICD-10-CM | POA: Diagnosis not present

## 2020-12-22 ENCOUNTER — Telehealth: Payer: Self-pay | Admitting: *Deleted

## 2020-12-22 NOTE — Progress Notes (Signed)
She needs a repeat thoracentesis or discuss referral to cardiothoracic surgery for pleural bx.   We need to figure out when a good time for her would be if she wants repeat thora  I am available on 8/8 or 8/9 in the hospital.  She will need to hold her [facility]? But we will need to speak with her cardiologist to make sure we can hold it. She recently had a thora on [facility] which I am fine doing again if she cant hold it  Thanks,  BLI   BLI  Garner Nash, DO [facility] 12/22/2020 7:23 AM

## 2020-12-22 NOTE — Telephone Encounter (Signed)
So to clarify, we are scheduling for thora in ENDO? Or making her an appointment to discuss if she wants a thora or a referral?   Let me know so I can talk with Larene Beach

## 2020-12-22 NOTE — Telephone Encounter (Signed)
Telephone encounter on patient. Will close Mychart message and call patient.

## 2020-12-22 NOTE — Telephone Encounter (Signed)
Please have her hold AC two full days prior  Can restart after.  Eliquis - stop 8/6. Last dose should be Friday (8/5) pm dose.   ATC patient to let her know this per Dr. Valeta Harms, unable to reach and VM is full.   Please share with patient when she calls back

## 2020-12-22 NOTE — Telephone Encounter (Signed)
You can ask her if she wants it drained again if she is feeling SOB.   If so, go ahead and schedule thoracentesis on 8/8 or 8/9 in the afternoon.   If she wants to have an appointment first to consider alternatives we can do that.  But her effusion is rather large and has already reaccumulated quickly.   I would also prefer to do this off of anticoagulation.  But she may not be able to stop this as she just recently got a cardioversion. I will ask Dr. Charlestine Night? Can we hold her AC for thora and then restart afterwards.   Thanks   Lance Muss, DO  [facility]  12/22/2020 10:51 AM    Called spoke with patient. She is good to schedule a thoracentesis on Monday August 8.  Let me know if this works for [employer]

## 2020-12-22 NOTE — Telephone Encounter (Signed)
-----   Message from Garner Nash, DO sent at 12/22/2020  7:40 AM EDT ----- She needs a repeat thoracentesis or discuss referral to cardiothoracic surgery for pleural bx.   We need to figure out when a good time for her would be if she wants repeat thora  I am available on 8/8 or 8/9 in the hospital.  She will need to hold her [facility]? But we will need to speak with her cardiologist to make sure we can hold it. She recently had a thora on [place] which I am fine doing again if she cant hold it  Thanks,  BLI   BLI  Garner Nash, DO [facility] 12/22/2020 7:23 AM

## 2020-12-22 NOTE — Telephone Encounter (Signed)
Called spoke with patient.  Let her know Monday August 8 at 3pm to arrive at 2:45pm. She verified it was in [place] and address of hospital given to patient.  Nothing further needed at this time.

## 2020-12-23 ENCOUNTER — Telehealth: Payer: Self-pay | Admitting: Pulmonary Disease

## 2020-12-23 NOTE — Telephone Encounter (Signed)
Patient returned call and instructions given 12/23/20.

## 2020-12-23 NOTE — Telephone Encounter (Signed)
Called and spoke with patient to make sure she is aware of instructions with Eliquis. She stated she was. Nothing further needed.

## 2020-12-23 NOTE — Telephone Encounter (Signed)
Called and spoke with Patient.  Thoracentesis instructions given.  Understanding stated.  Nothing further at this time.   Instructions- Monday August 8 at 3pm to arrive at 2:45pm.  Please have her hold [facility] two full days prior  Can restart after.   Eliquis - stop 8/6. Last dose should be Friday (8/5) pm dose.

## 2020-12-27 ENCOUNTER — Telehealth: Payer: Self-pay | Admitting: Pulmonary Disease

## 2020-12-27 ENCOUNTER — Ambulatory Visit (HOSPITAL_COMMUNITY): Payer: Medicare Other

## 2020-12-27 ENCOUNTER — Encounter (HOSPITAL_COMMUNITY)
Admission: RE | Disposition: A | Discharge status: Home / Self Care | Payer: Self-pay | Source: Home / Self Care | Attending: Pulmonary Disease

## 2020-12-27 ENCOUNTER — Encounter (HOSPITAL_COMMUNITY): Payer: Self-pay | Admitting: Pulmonary Disease

## 2020-12-27 ENCOUNTER — Ambulatory Visit (HOSPITAL_COMMUNITY)
Admission: RE | Admit: 2020-12-27 | Discharge: 2020-12-27 | Disposition: A | Discharge status: Home / Self Care | Payer: Medicare Other | Attending: Pulmonary Disease | Admitting: Pulmonary Disease

## 2020-12-27 DIAGNOSIS — Z79899 Other long term (current) drug therapy: Secondary | ICD-10-CM | POA: Insufficient documentation

## 2020-12-27 DIAGNOSIS — Z87891 Personal history of nicotine dependence: Secondary | ICD-10-CM | POA: Diagnosis not present

## 2020-12-27 DIAGNOSIS — J90 Pleural effusion, not elsewhere classified: Secondary | ICD-10-CM | POA: Insufficient documentation

## 2020-12-27 DIAGNOSIS — J948 Other specified pleural conditions: Secondary | ICD-10-CM | POA: Diagnosis not present

## 2020-12-27 DIAGNOSIS — N1831 Chronic kidney disease, stage 3a: Secondary | ICD-10-CM | POA: Diagnosis not present

## 2020-12-27 DIAGNOSIS — I5032 Chronic diastolic (congestive) heart failure: Secondary | ICD-10-CM | POA: Insufficient documentation

## 2020-12-27 DIAGNOSIS — I130 Hypertensive heart and chronic kidney disease with heart failure and stage 1 through stage 4 chronic kidney disease, or unspecified chronic kidney disease: Secondary | ICD-10-CM | POA: Diagnosis not present

## 2020-12-27 DIAGNOSIS — Z7901 Long term (current) use of anticoagulants: Secondary | ICD-10-CM | POA: Diagnosis not present

## 2020-12-27 DIAGNOSIS — E785 Hyperlipidemia, unspecified: Secondary | ICD-10-CM | POA: Insufficient documentation

## 2020-12-27 DIAGNOSIS — Z85828 Personal history of other malignant neoplasm of skin: Secondary | ICD-10-CM | POA: Diagnosis not present

## 2020-12-27 DIAGNOSIS — Z9889 Other specified postprocedural states: Secondary | ICD-10-CM

## 2020-12-27 HISTORY — PX: THORACENTESIS: SHX235

## 2020-12-27 LAB — ALBUMIN, PLEURAL OR PERITONEAL FLUID: Albumin, Fluid: 1.6 g/dL

## 2020-12-27 LAB — GLUCOSE, PLEURAL OR PERITONEAL FLUID: Glucose, Fluid: 114 mg/dL

## 2020-12-27 LAB — LACTATE DEHYDROGENASE, PLEURAL OR PERITONEAL FLUID: LD, Fluid: 44 U/L — ABNORMAL HIGH (ref 3–23)

## 2020-12-27 SURGERY — THORACENTESIS
Anesthesia: General

## 2020-12-27 NOTE — Interval H&P Note (Signed)
History and Physical Interval Note:  12/27/2020 1:27 PM  Dawn Bruce  has presented today for surgery, with the diagnosis of pleural effusion.  The various methods of treatment have been discussed with the patient and family. After consideration of risks, benefits and other options for treatment, the patient has consented to  Procedure(s): THORACENTESIS (N/A) as a surgical intervention.  The patient's history has been reviewed, patient examined, no change in status, stable for surgery.  I have reviewed the patient's chart and labs.  Questions were answered to the patient's satisfaction.     [place]

## 2020-12-27 NOTE — Op Note (Signed)
Thoracentesis  Procedure Note  Dawn Bruce  QF:[id number]  08-05-1945  Date:12/27/20  Time:2:17 PM   Provider Performing:Takeela Peil L Kamsiyochukwu Spickler   Procedure: Thoracentesis with imaging guidance [id number])  Indication(s) Pleural Effusion  Consent Risks of the procedure as well as the alternatives and risks of each were explained to the patient and/or caregiver.  Consent for the procedure was obtained and is signed in the bedside chart  Anesthesia Topical only with 1% lidocaine   Time Out Verified patient identification, verified procedure, site/side was marked, verified correct patient position, special equipment/implants available, medications/allergies/relevant history reviewed, required imaging and test results available.   Sterile Technique Maximal sterile technique including full sterile barrier drape, hand hygiene, sterile gown, sterile gloves, mask, hair covering, sterile ultrasound probe cover (if used).  Procedure Description Ultrasound was used to identify appropriate pleural anatomy for placement and overlying skin marked.  Area of drainage cleaned and draped in sterile fashion. Lidocaine was used to anesthetize the skin and subcutaneous tissue.  1100 cc's of yellow appearing fluid was drained from the left pleural space. Catheter then removed and bandaid applied to site.  Complications/Tolerance None; patient tolerated the procedure well. Chest X-ray is ordered to confirm no post-procedural complication.  EBL Minimal  Specimen(s) Pleural fluid   Garner Nash, DO [facility] 12/27/2020 2:21 PM

## 2020-12-27 NOTE — Telephone Encounter (Signed)
Pt has been scheduled for tomorrow afternoon at [facility] location - arrive 4:15 for 4:30 appt.  Pt still showing admitted at [facility] for Thoracentesis.  Will call pt once discharged.

## 2020-12-27 NOTE — Telephone Encounter (Signed)
PCCM:  Please schedule for CT Chest ASAP, today or tomorrow at [facility], [place] [facility] 12/27/2020 2:41 PM

## 2020-12-28 ENCOUNTER — Other Ambulatory Visit: Payer: Self-pay

## 2020-12-28 ENCOUNTER — Telehealth: Payer: Self-pay | Admitting: Pulmonary Disease

## 2020-12-28 ENCOUNTER — Ambulatory Visit
Admission: RE | Admit: 2020-12-28 | Discharge: 2020-12-28 | Disposition: A | Discharge status: Home / Self Care | Payer: Medicare Other | Source: Ambulatory Visit | Attending: Pulmonary Disease | Admitting: Pulmonary Disease

## 2020-12-28 DIAGNOSIS — J90 Pleural effusion, not elsewhere classified: Secondary | ICD-10-CM | POA: Diagnosis not present

## 2020-12-28 DIAGNOSIS — J9811 Atelectasis: Secondary | ICD-10-CM | POA: Diagnosis not present

## 2020-12-28 DIAGNOSIS — R911 Solitary pulmonary nodule: Secondary | ICD-10-CM | POA: Diagnosis not present

## 2020-12-28 DIAGNOSIS — R918 Other nonspecific abnormal finding of lung field: Secondary | ICD-10-CM | POA: Diagnosis not present

## 2020-12-28 LAB — AMYLASE, PLEURAL OR PERITONEAL FLUID: Amylase, Fluid: 32 U/L

## 2020-12-28 LAB — BODY FLUID CELL COUNT WITH DIFFERENTIAL
Eos, Fluid: 0 %
Lymphs, Fluid: 83 %
Monocyte-Macrophage-Serous Fluid: 5 % — ABNORMAL LOW (ref 50–90)
Neutrophil Count, Fluid: 11 % (ref 0–25)
Total Nucleated Cell Count, Fluid: 898 cu mm (ref 0–1000)

## 2020-12-28 LAB — PROTEIN, PLEURAL OR PERITONEAL FLUID: Total protein, fluid: <3 g/dL

## 2020-12-28 LAB — TRIGLYCERIDES, BODY FLUIDS: Triglycerides, Fluid: 16 mg/dL

## 2020-12-28 LAB — CYTOLOGY - NON PAP

## 2020-12-28 NOTE — Telephone Encounter (Signed)
Received call from [place] with the [facility]. Pt's had thoracentesis and pt's lab result for the pleural fluid protein was posted as <6 g/dL. However, this has been corrected to <3g/dL. The correct value is <3g/dL for pt's pleural fluid protein level.   Will forward to Dr. Valeta Harms as Juluis Rainier.

## 2020-12-28 NOTE — Progress Notes (Signed)
Fluid all appears like her previous taps. She has an appt scheduled with thoracic surgery already for possible pleural biopsy  Thanks,  BLI  Garner Nash, DO [facility] 12/28/2020 5:37 PM

## 2020-12-28 NOTE — Progress Notes (Signed)
No obvious reason for effusion identified on CT scan.  Patient needs to keep consult appointment scheduled with Dr. Kipp Brood.  FYI, HL CT scan of the chest was complete after thoracentesis.  Nothing obvious identified within the pleura.  Thanks,  BLI  Garner Nash, DO [facility] 12/28/2020 5:41 PM

## 2020-12-28 NOTE — Telephone Encounter (Signed)
Called and spoke with Dawn Bruce, she states the pleural fluid that was collected 12/27/20 the Basophils are corrected to 1 (not 12), the Lymphocytes is corrected to 83, monophils are 5 and neutrophils are 11.  Dr. Valeta Harms, Lab called with corrected values.  Thank you.

## 2020-12-28 NOTE — Telephone Encounter (Signed)
Zelda from [facility] with a lab report. Pls regard; [phone]

## 2020-12-28 NOTE — Telephone Encounter (Signed)
Kiristene from [employer]. Lymphocytes is corrected to 83. Basophils is corrected to 1. Kiristene phone number is [phone].

## 2020-12-29 ENCOUNTER — Encounter (HOSPITAL_COMMUNITY): Payer: Self-pay | Admitting: Pulmonary Disease

## 2020-12-30 LAB — CHOLESTEROL, BODY FLUID: Cholesterol, Fluid: 29 mg/dL

## 2020-12-30 LAB — PH, BODY FLUID: pH, Body Fluid: 7.8

## 2020-12-31 LAB — BODY FLUID CULTURE W GRAM STAIN: Culture: NO GROWTH

## 2021-01-03 ENCOUNTER — Encounter: Payer: Self-pay | Admitting: *Deleted

## 2021-01-03 DIAGNOSIS — J984 Other disorders of lung: Secondary | ICD-10-CM | POA: Diagnosis not present

## 2021-01-03 DIAGNOSIS — I4819 Other persistent atrial fibrillation: Secondary | ICD-10-CM | POA: Diagnosis not present

## 2021-01-03 DIAGNOSIS — I5030 Unspecified diastolic (congestive) heart failure: Secondary | ICD-10-CM | POA: Diagnosis not present

## 2021-01-03 DIAGNOSIS — Z87891 Personal history of nicotine dependence: Secondary | ICD-10-CM | POA: Diagnosis not present

## 2021-01-03 DIAGNOSIS — J90 Pleural effusion, not elsewhere classified: Secondary | ICD-10-CM | POA: Diagnosis not present

## 2021-01-03 DIAGNOSIS — R918 Other nonspecific abnormal finding of lung field: Secondary | ICD-10-CM | POA: Diagnosis not present

## 2021-01-03 DIAGNOSIS — E785 Hyperlipidemia, unspecified: Secondary | ICD-10-CM | POA: Diagnosis not present

## 2021-01-03 DIAGNOSIS — I10 Essential (primary) hypertension: Secondary | ICD-10-CM | POA: Diagnosis not present

## 2021-01-03 DIAGNOSIS — Z79899 Other long term (current) drug therapy: Secondary | ICD-10-CM | POA: Diagnosis not present

## 2021-01-03 DIAGNOSIS — N1831 Chronic kidney disease, stage 3a: Secondary | ICD-10-CM | POA: Diagnosis not present

## 2021-01-03 DIAGNOSIS — I130 Hypertensive heart and chronic kidney disease with heart failure and stage 1 through stage 4 chronic kidney disease, or unspecified chronic kidney disease: Secondary | ICD-10-CM | POA: Diagnosis not present

## 2021-01-03 DIAGNOSIS — Z7901 Long term (current) use of anticoagulants: Secondary | ICD-10-CM | POA: Diagnosis not present

## 2021-01-04 ENCOUNTER — Other Ambulatory Visit: Payer: Self-pay

## 2021-01-04 ENCOUNTER — Encounter: Payer: Self-pay | Admitting: Thoracic Surgery (Cardiothoracic Vascular Surgery)

## 2021-01-04 ENCOUNTER — Institutional Professional Consult (permissible substitution): Payer: Medicare Other | Admitting: Thoracic Surgery (Cardiothoracic Vascular Surgery)

## 2021-01-04 VITALS — BP 132/83 | HR 104 | Resp 20 | Ht 63.0 in | Wt 127.0 lb

## 2021-01-04 DIAGNOSIS — J90 Pleural effusion, not elsewhere classified: Secondary | ICD-10-CM

## 2021-01-04 NOTE — Progress Notes (Signed)
[address]       [place] [address]             [phone]                    Junella Bealer [place] Medical Record [id number] Date of Birth: Apr 22, 1946  Referring: Garner Nash, DO Primary Care: Virginia Crews, MD Primary Cardiologist: Kate Sable, MD  Chief Complaint:    Chief Complaint  Patient presents with   Pleural Effusion    Initial surgical consult, CT chest 8/9, thoracentesis 8/8    History of Present Illness:    Dawn Bruce 75 y.o. female referred for surgical evaluation of recurrent left-sided pleural effusion.  She originally started having some symptoms of shortness of breath and fatigue in February of this year.  She was noted to be in atrial fibrillation at that point.  She is also had issues with her kidneys in regards to an elevated creatinine of 2.7.  She states that this occurred when they were titrating her blood pressure medication.  In regards to the pleural effusion she has had a thoracentesis on 3 separate occasions, and starts becoming symptomatic about 3 weeks after each drainage.  She does not have a personal history of cancer.  Her weight has been stable, and she denies any neurologic symptoms.    Smoking Hx: Quit cigarettes in 1995.   Zubrod Score: At the time of surgery this patient's most appropriate activity status/level should be described as: '[x]'$     0    Normal activity, no symptoms '[]'$     1    Restricted in physical strenuous activity but ambulatory, able to do out light work '[]'$     2    Ambulatory and capable of self care, unable to do work activities, up and about               >50 % of waking hours                              '[]'$     3    Only limited self care, in bed greater than 50% of waking hours '[]'$     4    Completely disabled, no self care, confined to bed or chair '[]'$     5    Moribund   Past Medical History:  Diagnosis Date   Actinic keratosis    Chronic pain    Hyperlipidemia     Hypertension    Squamous cell carcinoma of skin 09/10/2019   R dorsum hand and L wrist    Squamous cell carcinoma of skin 10/28/2019   R dorsum hand prox lateral    Squamous cell carcinoma of skin 10/28/2019   R dorsum hand prox medial    Squamous cell carcinoma of skin 10/28/2019   R dorsum hand distal lateral    Squamous cell carcinoma of skin 10/28/2019   R dorsum hand distal medial    Squamous cell carcinoma of skin 03/04/2020   SCC IS R medial infraorbital, LN2 04/28/20   Squamous cell carcinoma of skin 03/04/2020   R dorsal hand, EDC   Squamous cell carcinoma of skin 03/04/2020   SCC IS, L dorsum wrist, EDC    Past Surgical History:  Procedure Laterality Date   ABDOMINAL HYSTERECTOMY     AUGMENTATION MAMMAPLASTY Bilateral    25-30 years ago   CARDIOVERSION  N/A 12/01/2020   Procedure: CARDIOVERSION;  Surgeon: Kate Sable, MD;  Location: [facility] ORS;  Service: Cardiovascular;  Laterality: N/A;   CAROTID ARTERY ANGIOPLASTY Right 1995(approximate)   CAROTID ENDARTERECTOMY  2001   COSMETIC SURGERY     ELBOW SURGERY     THORACENTESIS N/A 12/27/2020   Procedure: THORACENTESIS;  Surgeon: Garner Nash, DO;  Location: [place];  Service: Pulmonary;  Laterality: N/A;   TONSILLECTOMY      Family History  Problem Relation Age of Onset   Cancer Mother    Cancer Father    Breast cancer Neg Hx      Social History   Tobacco Use  Smoking Status Former   Packs/day: 0.50   Years: 5.00   Pack years: 2.50   Types: Cigarettes   Quit date: 1990   Years since quitting: 32.6  Smokeless Tobacco Never  Tobacco Comments   quit around [phone] (?)    Social History   Substance and Sexual Activity  Alcohol Use Yes   Alcohol/week: 21.0 standard drinks   Types: 21 Shots of liquor per week   Comment: 3 scotch drinks a night     No Known Allergies  Current Outpatient Medications  Medication Sig Dispense Refill   amLODipine (NORVASC) 5 MG tablet Take 1 tablet (5 mg  total) by mouth daily. 90 tablet 1   apixaban (ELIQUIS) 5 MG TABS tablet Take 1 tablet (5 mg total) by mouth 2 (two) times daily. 60 tablet 5   atorvastatin (LIPITOR) 40 MG tablet Take 40 mg by mouth every evening.     Cholecalciferol (VITAMIN D3) 50 MCG (2000 UT) capsule Take 2,000 Units by mouth every evening.     escitalopram (LEXAPRO) 10 MG tablet Take 1 tablet (10 mg total) by mouth daily. 90 tablet 0   famotidine (PEPCID) 40 MG tablet Take 1 tablet (40 mg total) by mouth daily. 90 tablet 0   fentaNYL (DURAGESIC) 50 MCG/HR Place 1 patch onto the skin every 3 (three) days. 10 patch 0   metoprolol tartrate (LOPRESSOR) 25 MG tablet Take 1 tablet (25 mg total) by mouth 2 (two) times daily. 180 tablet 1   Misc Natural Product [facility] EYE DROPS #1 OP) Place 1 drop into both eyes in the morning. Similasan Complete Eye Relief (Belladonna/Euphrasia/Mercurius Sublimatus)     pregabalin (LYRICA) 150 MG capsule Take 1 capsule (150 mg total) by mouth 2 (two) times daily. 180 capsule 3   No current facility-administered medications for this visit.    Review of Systems  Constitutional:  Positive for malaise/fatigue. Negative for weight loss.  Respiratory:  Positive for cough and shortness of breath.   Cardiovascular:  Positive for palpitations. Negative for chest pain, orthopnea and leg swelling.  Neurological: Negative.     PHYSICAL EXAMINATION: BP 132/83 (BP Location: Right Arm, Patient Position: Sitting)   Pulse (!) 104   Resp 20   Ht '5\' 3"'$  (1.6 m)   Wt 127 lb (57.6 kg)   SpO2 93% Comment: RA  BMI 22.50 kg/m  Physical Exam Constitutional:      General: She is not in acute distress.    Appearance: Normal appearance. She is normal weight. She is not ill-appearing.  HENT:     Head: Normocephalic and atraumatic.  Eyes:     Extraocular Movements: Extraocular movements intact.  Cardiovascular:     Rate and Rhythm: Tachycardia present.  Pulmonary:     Effort: Pulmonary effort is normal.  No respiratory distress.  Abdominal:  General: Abdomen is flat. There is no distension.  Musculoskeletal:        General: Normal range of motion.     Cervical back: Normal range of motion.  Skin:    General: Skin is dry.  Neurological:     General: No focal deficit present.     Mental Status: She is alert and oriented to person, place, and time.    Diagnostic Studies & Laboratory data:     Recent Radiology Findings:   DG Chest 2 View  Result Date: 12/22/2020 CLINICAL DATA:  Evaluation for pleural effusion. EXAM: CHEST - 2 VIEW COMPARISON:  11/29/2020. FINDINGS: Mediastinum and hilar structures normal. Left lower lung atelectasis/consolidation and prominent left pleural effusion noted. No pneumothorax. Heart size normal. Thoracic spine scoliosis and degenerative change. IMPRESSION: Left lower lung atelectasis/consolidation and prominent left pleural effusion. Electronically Signed   By: Marcello Moores  Register   On: 12/22/2020 05:59   CT CHEST WO CONTRAST  Result Date: 12/28/2020 CLINICAL DATA:  Pleural effusion. EXAM: CT CHEST WITHOUT CONTRAST TECHNIQUE: Multidetector CT imaging of the chest was performed following the standard protocol without IV contrast. COMPARISON:  09/28/2020 FINDINGS: Cardiovascular: The heart size is normal. No substantial pericardial effusion. Coronary artery calcification is evident. Mild atherosclerotic calcification is noted in the wall of the thoracic aorta. Mediastinum/Nodes: Stable appearance of scattered normal to upper normal lymph nodes including 10 mm short axis precarinal node. No evidence for gross hilar lymphadenopathy although assessment is limited by the lack of intravenous contrast on today's study. The esophagus has normal imaging features. There is no axillary lymphadenopathy. Lungs/Pleura: Biapical pleuroparenchymal scarring. Stable 3 mm left upper lobe nodule on image 30/3. Stable 6 mm perifissural nodule in the right lower lobe (88/3). Dependent  atelectasis noted both lower lobes. Small bilateral pleural effusions noted Upper Abdomen: Unremarkable. Musculoskeletal: No worrisome lytic or sclerotic osseous abnormality. IMPRESSION: 1. Interval decrease in small bilateral pleural effusions with dependent atelectasis in the lower lobes. 2. Stable tiny bilateral pulmonary nodules. Dominant nodule is 6 mm in the right lower lobe. Non-contrast chest CT at 15-21 months (from today's scan) is considered optional for low-risk patients, but is recommended for high-risk patients. This recommendation follows the consensus statement: Guidelines for Management of Incidental Pulmonary Nodules Detected on CT Images: From the Fleischner Society 2017; Radiology 2017; 284:228-243. 3. Aortic Atherosclerosis (ICD10-I70.0). Electronically Signed   By: Misty Stanley M.D.   On: 12/28/2020 17:00   DG CHEST PORT 1 VIEW  Result Date: 12/27/2020 CLINICAL DATA:  Status post thoracentesis EXAM: PORTABLE CHEST 1 VIEW COMPARISON:  12/21/2020, chest CT 09/28/2020 FINDINGS: Decreased size of the left pleural effusion, now small. Unchanged small right pleural effusion. There is no focal airspace consolidation. There is no visible pneumothorax. No acute osseous abnormality. Unchanged cardiomediastinal silhouette. IMPRESSION: Decreased, now small left pleural effusion after thoracentesis. No visible pneumothorax. Unchanged small right pleural effusion. Electronically Signed   By: Maurine Simmering   On: 12/27/2020 14:59       I have independently reviewed the above radiology studies  and reviewed the findings with the patient.   Recent Lab Findings: Lab Results  Component Value Date   WBC 6.5 11/19/2020   HGB 15.3 (H) 11/19/2020   HCT 46.8 (H) 11/19/2020   PLT 183 11/19/2020   GLUCOSE 121 (H) 11/19/2020   CHOL 178 04/20/2020   TRIG 79 04/20/2020   HDL 61 04/20/2020   LDLCALC 102 (H) 04/20/2020   ALT 12 04/20/2020   AST 24 04/20/2020  NA 141 11/19/2020   K 4.8 11/19/2020   CL  106 11/19/2020   CREATININE 1.53 (H) 11/19/2020   BUN 32 (H) 11/19/2020   CO2 27 11/19/2020   TSH 2.68 01/03/2018   TSH 2.68 01/03/2018   HGBA1C 6.0 (A) 12/03/2020       Assessment / Plan:   75 year old female with history of atrial fibrillation, chronic renal insufficiency, and a recurrent left-sided effusion.  On my review of her cross-sectional imaging she also has some fluid on the right side.  There are some tiny subcentimeter pulmonary nodules, but all are too small to biopsy or characterize.  Given her history I explained to her and her husband that this is likely metabolic, and could be related to newly diagnosed atrial fibrillation or chronic renal insufficiency.  That being said I do think that she has been completely optimized from a cardiac standpoint.  She is tachycardic today with heart rate of 104.  We discussed several options which included a left video-assisted thoracoscopy with pleural biopsy and possible Pleurx catheter placement, but I would like her to be optimized medically before attempting this.  Another option to rule out any pleural disease would be to obtain a PET/CT to see if there is any avidity in her left pleural space.  The patient and her husband both stated that they wanted to consolidate their care at [facility].  They already have appointments with pulmonary medicine for this.  They can follow-up with me as needed.     I  spent 40 minutes with  the patient face to face in counseling and coordination of care.    Dawn Bruce 01/04/2021 4:49 PM

## 2021-01-07 ENCOUNTER — Ambulatory Visit: Payer: Medicare Other | Admitting: Cardiology

## 2021-01-07 ENCOUNTER — Other Ambulatory Visit: Payer: Self-pay

## 2021-01-07 ENCOUNTER — Encounter: Payer: Self-pay | Admitting: Cardiology

## 2021-01-07 VITALS — BP 112/60 | HR 105 | Ht 63.0 in | Wt 128.0 lb

## 2021-01-07 DIAGNOSIS — I10 Essential (primary) hypertension: Secondary | ICD-10-CM | POA: Diagnosis not present

## 2021-01-07 DIAGNOSIS — J90 Pleural effusion, not elsewhere classified: Secondary | ICD-10-CM

## 2021-01-07 DIAGNOSIS — I4891 Unspecified atrial fibrillation: Secondary | ICD-10-CM

## 2021-01-07 MED ORDER — METOPROLOL TARTRATE 50 MG PO TABS: 50.0000 mg | ORAL_TABLET | Freq: Two times a day (BID) | ORAL | 1 refills | Status: DC

## 2021-01-07 NOTE — Progress Notes (Signed)
Cardiology Office Note:    Date:  01/07/2021   ID:  Dawn Bruce, DOB [birth year], MRN [id number]  PCP:  Virginia Crews, MD   [place]  Cardiologist:  Kate Sable, MD  Advanced Practice Provider:  No care team member to display Electrophysiologist:  None       Referring MD: Virginia Crews, MD   Chief Complaint  Patient presents with   Other    1 month follow up. Patient states she thinks she is out of rhythm sometimes. Meds reviewed verbally with patient.      History of Present Illness:    Dawn Bruce is a 75 y.o. female with a hx of persistent atrial fibrillation s/p DCCV 7/13, hypertension, hyperlipidemia, former smoker, pleural effusion worse on the left s/p thoracenteses x 3 who presents for follow-up.    Patient is being seen for persistent atrial fibrillation.  Underwent DC cardioversion on 12/08/2020 successfully.  Later on noticed irregular heartbeats.  She has undergone 3 thoracentesis for recurrent pleural effusions.  Follow-up with CT surgery, she would like to have second opinion at [facility].  Plans to have all her care including CT surgery, cardiology, nephrology at [facility] due to convenience.  Denies dizziness, has some shortness of breath with exertion.  Tolerating current doses of Lopressor, no bleeding issues with Eliquis.   Prior notes Echo 09/2020, EF 50 to [id number], grade 2 diastolic dysfunction, moderate left pleural effusion.    Past Medical History:  Diagnosis Date   Actinic keratosis    Chronic pain    Hyperlipidemia    Hypertension    Squamous cell carcinoma of skin 09/10/2019   R dorsum hand and L wrist    Squamous cell carcinoma of skin 10/28/2019   R dorsum hand prox lateral    Squamous cell carcinoma of skin 10/28/2019   R dorsum hand prox medial    Squamous cell carcinoma of skin 10/28/2019   R dorsum hand distal lateral    Squamous cell carcinoma of skin 10/28/2019   R dorsum hand distal medial     Squamous cell carcinoma of skin 03/04/2020   SCC IS R medial infraorbital, LN2 04/28/20   Squamous cell carcinoma of skin 03/04/2020   R dorsal hand, EDC   Squamous cell carcinoma of skin 03/04/2020   SCC IS, L dorsum wrist, EDC    Past Surgical History:  Procedure Laterality Date   ABDOMINAL HYSTERECTOMY     AUGMENTATION MAMMAPLASTY Bilateral    25-30 years ago   CARDIOVERSION N/A 12/01/2020   Procedure: CARDIOVERSION;  Surgeon: Kate Sable, MD;  Location: [facility];  Service: Cardiovascular;  Laterality: N/A;   CAROTID ARTERY ANGIOPLASTY Right 1995(approximate)   CAROTID ENDARTERECTOMY  2001   COSMETIC SURGERY     ELBOW SURGERY     THORACENTESIS N/A 12/27/2020   Procedure: THORACENTESIS;  Surgeon: Garner Nash, DO;  Location: [facility];  Service: Pulmonary;  Laterality: N/A;   TONSILLECTOMY      Current Medications: Current Meds  Medication Sig   apixaban (ELIQUIS) 5 MG TABS tablet Take 1 tablet (5 mg total) by mouth 2 (two) times daily.   atorvastatin (LIPITOR) 40 MG tablet Take 40 mg by mouth every evening.   Cholecalciferol (VITAMIN D3) 50 MCG (2000 UT) capsule Take 2,000 Units by mouth every evening.   escitalopram (LEXAPRO) 10 MG tablet Take 1 tablet (10 mg total) by mouth daily.   famotidine (PEPCID) 40 MG tablet Take 1 tablet (40  mg total) by mouth daily.   fentaNYL (DURAGESIC) 50 MCG/HR Place 1 patch onto the skin every 3 (three) days.   Misc Natural Product [facility] EYE DROPS #1 OP) Place 1 drop into both eyes in the morning. Similasan Complete Eye Relief (Belladonna/Euphrasia/Mercurius Sublimatus)   pregabalin (LYRICA) 150 MG capsule Take 1 capsule (150 mg total) by mouth 2 (two) times daily.   [DISCONTINUED] amLODipine (NORVASC) 5 MG tablet Take 1 tablet (5 mg total) by mouth daily.   [DISCONTINUED] metoprolol tartrate (LOPRESSOR) 25 MG tablet Take 1 tablet (25 mg total) by mouth 2 (two) times daily.     Allergies:   Patient has no known allergies.    Social History   Socioeconomic History   Marital status: Married    Spouse name: Not on file   Number of children: 3   Years of education: Not on file   Highest education level: Some college, no degree  Occupational History   Occupation: retired  Tobacco Use   Smoking status: Former    Packs/day: 0.50    Years: 5.00    Pack years: 2.50    Types: Cigarettes    Quit date: 1990    Years since quitting: 32.6   Smokeless tobacco: Never   Tobacco comments:    quit around 1990-1995 (?)  Vaping Use   Vaping Use: Never used  Substance and Sexual Activity   Alcohol use: Yes    Alcohol/week: 21.0 standard drinks    Types: 21 Shots of liquor per week    Comment: 3 scotch drinks a night   Drug use: Never   Sexual activity: Not on file  Other Topics Concern   Not on file  Social History Narrative   Not on file   Social Determinants of Health   Financial Resource Strain: Low Risk    Difficulty of Paying Living Expenses: Not hard at all  Food Insecurity: No Food Insecurity   Worried About Charity fundraiser in the Last Year: Never true   [place] in the Last Year: Never true  Transportation Needs: No Transportation Needs   Lack of Transportation (Medical): No   Lack of Transportation (Non-Medical): No  Physical Activity: Insufficiently Active   Days of Exercise per Week: 7 days   Minutes of Exercise per Session: 10 min  Stress: No Stress Concern Present   Feeling of Stress : Not at all  Social Connections: Moderately Isolated   Frequency of Communication with Friends and Family: More than three times a week   Frequency of Social Gatherings with Friends and Family: Twice a week   Attends Religious Services: Never   Marine scientist or Organizations: No   Attends Music therapist: Never   Marital Status: Married     Family History: The patient's family history includes Cancer in her father and mother. There is no history of Breast  cancer.  ROS:   Please see the history of present illness.     All other systems reviewed and are negative.  EKGs/Labs/Other Studies Reviewed:    The following studies were reviewed today:   EKG:  EKG is ordered today.  EKG shows atrial fibrillation, heart rate 105 Recent Labs: 04/20/2020: ALT 12 11/19/2020: BUN 32; Creatinine, Ser 1.53; Hemoglobin 15.3; Platelets 183; Potassium 4.8; Sodium 141  Recent Lipid Panel    Component Value Date/Time   CHOL 178 04/20/2020 1121   TRIG 79 04/20/2020 1121   HDL 61 04/20/2020 1121  CHOLHDL 2.9 04/20/2020 1121   LDLCALC 102 (H) 04/20/2020 1121     Risk Assessment/Calculations:      Physical Exam:    VS:  BP 112/60 (BP Location: Left Arm, Patient Position: Sitting, Cuff Size: Normal)   Pulse (!) 105   Ht '5\' 3"'$  (1.6 m)   Wt 128 lb (58.1 kg)   BMI 22.67 kg/m     Wt Readings from Last 3 Encounters:  01/07/21 128 lb (58.1 kg)  01/04/21 127 lb (57.6 kg)  12/27/20 127 lb (57.6 kg)     GEN:  Well nourished, well developed in no acute distress HEENT: Normal NECK: No JVD; No carotid bruits LYMPHATICS: No lymphadenopathy CARDIAC: Irregular irregular, no murmurs, rubs, gallops RESPIRATORY: Clear anteriorly, decreased breath sounds at bases, worse on left lower base ABDOMEN: Soft, non-tender, non-distended MUSCULOSKELETAL:  No edema; No deformity  SKIN: Warm and dry NEUROLOGIC:  Alert and oriented x 3 PSYCHIATRIC:  Normal affect   ASSESSMENT:    1. Atrial fibrillation, unspecified type ([place])   2. Pleural effusion   3. Primary hypertension      PLAN:    In order of problems listed above:  Persistent A. fib, CHA2DS2-VASc 4 (age, htn, gender). S/p DCCV 11/2020.  EKG today showing A. fib, heart rate 105.  Increase Lopressor to 50 mg twice daily, continue Eliquis. Recurrent pleural effusion, left greater than right, s/p thoracentesis.  Etiology unclear, due to effusion not symmetrical, I highly doubt cardiac etiology.  Plans to  follow-up with CT surgery on 2, for additional/second input. Hypertension, BP controlled, increase Lopressor as above.  Hold amlodipine with increasing Lopressor.   Follow-up as needed.  Patient plans to follow-up for ongoing medical care at [facility], once all her specialists including CT surgery, nephrology, under 1 roof which I think is reasonable.    Medication Adjustments/Labs and Tests Ordered: Current medicines are reviewed at length with the patient today.  Concerns regarding medicines are outlined above.  Orders Placed This Encounter  Procedures   EKG 12-Lead     Meds ordered this encounter  Medications   metoprolol tartrate (LOPRESSOR) 50 MG tablet    Sig: Take 1 tablet (50 mg total) by mouth 2 (two) times daily.    Dispense:  180 tablet    Refill:  1     Patient Instructions  Medication Instructions:   Your physician has recommended you make the following change in your medication:    HOLD your Amlodipine for now.  2.   INCREASE your Lopressor (metoprolol tartrate) to 50 MG twice a day.  *If you need a refill on your cardiac medications before your next appointment, please call your pharmacy*   Lab Work: None ordered If you have labs (blood work) drawn today and your tests are completely normal, you will receive your results only by: [place] (if you have MyChart) OR A paper copy in the mail If you have any lab test that is abnormal or we need to change your treatment, we will call you to review the results.   Testing/Procedures: None ordered   Follow-Up: At [facility], you and your health needs are our priority.  As part of our continuing mission to provide you with exceptional heart care, we have created designated Provider Care Teams.  These Care Teams include your primary Cardiologist (physician) and Advanced Practice Providers (APPs -  Physician Assistants and Nurse Practitioners) who all work together to provide you with the care you need, when  you need  it.  We recommend signing up for the patient portal called "MyChart".  Sign up information is provided on this After Visit Summary.  MyChart is used to connect with patients for Virtual Visits (Telemedicine).  Patients are able to view lab/test results, encounter notes, upcoming appointments, etc.  Non-urgent messages can be sent to your provider as well.   To learn more about what you can do with MyChart, go to [email or web address].    Your next appointment:   Follow up as needed   The format for your next appointment:   In Person  Provider:   Kate Sable, MD   Other Instructions    Signed, Kate Sable, MD  01/07/2021 3:36 PM    [place]

## 2021-01-07 NOTE — Patient Instructions (Signed)
Medication Instructions:   Your physician has recommended you make the following change in your medication:    HOLD your Amlodipine for now.  2.   INCREASE your Lopressor (metoprolol tartrate) to 50 MG twice a day.  *If you need a refill on your cardiac medications before your next appointment, please call your pharmacy*   Lab Work: None ordered If you have labs (blood work) drawn today and your tests are completely normal, you will receive your results only by: [place] (if you have MyChart) OR A paper copy in the mail If you have any lab test that is abnormal or we need to change your treatment, we will call you to review the results.   Testing/Procedures: None ordered   Follow-Up: At [facility], you and your health needs are our priority.  As part of our continuing mission to provide you with exceptional heart care, we have created designated Provider Care Teams.  These Care Teams include your primary Cardiologist (physician) and Advanced Practice Providers (APPs -  Physician Assistants and Nurse Practitioners) who all work together to provide you with the care you need, when you need it.  We recommend signing up for the patient portal called "MyChart".  Sign up information is provided on this After Visit Summary.  MyChart is used to connect with patients for Virtual Visits (Telemedicine).  Patients are able to view lab/test results, encounter notes, upcoming appointments, etc.  Non-urgent messages can be sent to your provider as well.   To learn more about what you can do with MyChart, go to [email or web address].    Your next appointment:   Follow up as needed   The format for your next appointment:   In Person  Provider:   Kate Sable, MD   Other Instructions

## 2021-01-17 DIAGNOSIS — I129 Hypertensive chronic kidney disease with stage 1 through stage 4 chronic kidney disease, or unspecified chronic kidney disease: Secondary | ICD-10-CM | POA: Diagnosis not present

## 2021-01-17 DIAGNOSIS — E559 Vitamin D deficiency, unspecified: Secondary | ICD-10-CM | POA: Diagnosis not present

## 2021-01-17 DIAGNOSIS — N183 Chronic kidney disease, stage 3 unspecified: Secondary | ICD-10-CM | POA: Diagnosis not present

## 2021-01-17 DIAGNOSIS — I4891 Unspecified atrial fibrillation: Secondary | ICD-10-CM | POA: Diagnosis not present

## 2021-01-19 ENCOUNTER — Ambulatory Visit: Payer: Medicare Other | Admitting: Pulmonary Disease

## 2021-01-27 DIAGNOSIS — J90 Pleural effusion, not elsewhere classified: Secondary | ICD-10-CM | POA: Diagnosis not present

## 2021-01-27 DIAGNOSIS — I5032 Chronic diastolic (congestive) heart failure: Secondary | ICD-10-CM | POA: Diagnosis not present

## 2021-01-27 DIAGNOSIS — Z7901 Long term (current) use of anticoagulants: Secondary | ICD-10-CM | POA: Diagnosis not present

## 2021-01-27 DIAGNOSIS — I10 Essential (primary) hypertension: Secondary | ICD-10-CM | POA: Diagnosis not present

## 2021-01-27 DIAGNOSIS — I4819 Other persistent atrial fibrillation: Secondary | ICD-10-CM | POA: Diagnosis not present

## 2021-01-27 DIAGNOSIS — I110 Hypertensive heart disease with heart failure: Secondary | ICD-10-CM | POA: Diagnosis not present

## 2021-01-27 DIAGNOSIS — Z87891 Personal history of nicotine dependence: Secondary | ICD-10-CM | POA: Diagnosis not present

## 2021-01-28 LAB — FUNGUS CULTURE WITH STAIN

## 2021-01-28 LAB — FUNGUS CULTURE RESULT

## 2021-01-28 LAB — FUNGAL ORGANISM REFLEX

## 2021-02-02 ENCOUNTER — Other Ambulatory Visit: Payer: Self-pay | Admitting: Family Medicine

## 2021-02-02 NOTE — Telephone Encounter (Signed)
[facility] faxed refill request for the following medications:  atorvastatin (LIPITOR) 40 MG tablet   Please advise.  

## 2021-02-03 DIAGNOSIS — R7303 Prediabetes: Secondary | ICD-10-CM | POA: Diagnosis not present

## 2021-02-03 DIAGNOSIS — R9431 Abnormal electrocardiogram [ECG] [EKG]: Secondary | ICD-10-CM | POA: Diagnosis not present

## 2021-02-03 DIAGNOSIS — I440 Atrioventricular block, first degree: Secondary | ICD-10-CM | POA: Diagnosis not present

## 2021-02-03 DIAGNOSIS — N183 Chronic kidney disease, stage 3 unspecified: Secondary | ICD-10-CM | POA: Diagnosis not present

## 2021-02-03 DIAGNOSIS — K219 Gastro-esophageal reflux disease without esophagitis: Secondary | ICD-10-CM | POA: Diagnosis not present

## 2021-02-03 DIAGNOSIS — I130 Hypertensive heart and chronic kidney disease with heart failure and stage 1 through stage 4 chronic kidney disease, or unspecified chronic kidney disease: Secondary | ICD-10-CM | POA: Diagnosis not present

## 2021-02-03 DIAGNOSIS — I4819 Other persistent atrial fibrillation: Secondary | ICD-10-CM | POA: Diagnosis not present

## 2021-02-03 DIAGNOSIS — F411 Generalized anxiety disorder: Secondary | ICD-10-CM | POA: Diagnosis not present

## 2021-02-03 DIAGNOSIS — I4892 Unspecified atrial flutter: Secondary | ICD-10-CM | POA: Diagnosis not present

## 2021-02-03 DIAGNOSIS — I081 Rheumatic disorders of both mitral and tricuspid valves: Secondary | ICD-10-CM | POA: Diagnosis not present

## 2021-02-03 DIAGNOSIS — Z7901 Long term (current) use of anticoagulants: Secondary | ICD-10-CM | POA: Diagnosis not present

## 2021-02-03 DIAGNOSIS — E785 Hyperlipidemia, unspecified: Secondary | ICD-10-CM | POA: Diagnosis not present

## 2021-02-03 DIAGNOSIS — I5032 Chronic diastolic (congestive) heart failure: Secondary | ICD-10-CM | POA: Diagnosis not present

## 2021-02-03 MED ORDER — ATORVASTATIN CALCIUM 40 MG PO TABS: 40.0000 mg | ORAL_TABLET | Freq: Every evening | ORAL | 1 refills | Status: DC

## 2021-02-06 ENCOUNTER — Encounter: Payer: Self-pay | Admitting: Family Medicine

## 2021-02-07 DIAGNOSIS — I480 Paroxysmal atrial fibrillation: Secondary | ICD-10-CM | POA: Diagnosis not present

## 2021-02-07 DIAGNOSIS — J948 Other specified pleural conditions: Secondary | ICD-10-CM | POA: Diagnosis not present

## 2021-02-07 DIAGNOSIS — J90 Pleural effusion, not elsewhere classified: Secondary | ICD-10-CM | POA: Diagnosis not present

## 2021-02-07 DIAGNOSIS — Z85828 Personal history of other malignant neoplasm of skin: Secondary | ICD-10-CM | POA: Diagnosis not present

## 2021-02-07 DIAGNOSIS — I10 Essential (primary) hypertension: Secondary | ICD-10-CM | POA: Diagnosis not present

## 2021-02-07 DIAGNOSIS — E559 Vitamin D deficiency, unspecified: Secondary | ICD-10-CM | POA: Insufficient documentation

## 2021-02-07 DIAGNOSIS — N1831 Chronic kidney disease, stage 3a: Secondary | ICD-10-CM | POA: Diagnosis not present

## 2021-02-07 DIAGNOSIS — Z7901 Long term (current) use of anticoagulants: Secondary | ICD-10-CM | POA: Diagnosis not present

## 2021-02-07 DIAGNOSIS — I4819 Other persistent atrial fibrillation: Secondary | ICD-10-CM | POA: Diagnosis not present

## 2021-02-07 DIAGNOSIS — Z87891 Personal history of nicotine dependence: Secondary | ICD-10-CM | POA: Diagnosis not present

## 2021-02-07 DIAGNOSIS — I6523 Occlusion and stenosis of bilateral carotid arteries: Secondary | ICD-10-CM | POA: Insufficient documentation

## 2021-02-07 DIAGNOSIS — R911 Solitary pulmonary nodule: Secondary | ICD-10-CM | POA: Diagnosis not present

## 2021-02-07 DIAGNOSIS — I672 Cerebral atherosclerosis: Secondary | ICD-10-CM | POA: Insufficient documentation

## 2021-02-07 DIAGNOSIS — I5032 Chronic diastolic (congestive) heart failure: Secondary | ICD-10-CM | POA: Diagnosis not present

## 2021-02-07 DIAGNOSIS — I130 Hypertensive heart and chronic kidney disease with heart failure and stage 1 through stage 4 chronic kidney disease, or unspecified chronic kidney disease: Secondary | ICD-10-CM | POA: Diagnosis not present

## 2021-02-07 DIAGNOSIS — I6529 Occlusion and stenosis of unspecified carotid artery: Secondary | ICD-10-CM | POA: Insufficient documentation

## 2021-02-07 DIAGNOSIS — I5022 Chronic systolic (congestive) heart failure: Secondary | ICD-10-CM | POA: Diagnosis not present

## 2021-02-07 DIAGNOSIS — I119 Hypertensive heart disease without heart failure: Secondary | ICD-10-CM | POA: Insufficient documentation

## 2021-02-07 DIAGNOSIS — J942 Hemothorax: Secondary | ICD-10-CM | POA: Diagnosis not present

## 2021-02-07 DIAGNOSIS — Z79899 Other long term (current) drug therapy: Secondary | ICD-10-CM | POA: Diagnosis not present

## 2021-02-07 MED ORDER — PREGABALIN 150 MG PO CAPS: 150.0000 mg | ORAL_CAPSULE | Freq: Two times a day (BID) | ORAL | 3 refills | Status: DC

## 2021-02-14 ENCOUNTER — Other Ambulatory Visit: Payer: Self-pay

## 2021-02-14 MED ORDER — APIXABAN 5 MG PO TABS: 5.0000 mg | ORAL_TABLET | Freq: Two times a day (BID) | ORAL | 5 refills | Status: DC

## 2021-02-14 NOTE — Telephone Encounter (Signed)
Eliquis 5 mg refill request received. Patient is 75 years old, weight-58.1 kg, Crea-1.3 on 02/03/21, Diagnosis-afib, and last seen by Dr. Garen Lah on 01/07/21. Dose is appropriate based on dosing criteria. Will send in refill to requested pharmacy.

## 2021-02-17 ENCOUNTER — Other Ambulatory Visit: Payer: Self-pay | Admitting: Family Medicine

## 2021-02-18 ENCOUNTER — Other Ambulatory Visit
Admission: RE | Admit: 2021-02-18 | Discharge: 2021-02-18 | Disposition: A | Discharge status: Home / Self Care | Payer: Medicare Other | Attending: Gerontology | Admitting: Gerontology

## 2021-02-18 DIAGNOSIS — I5022 Chronic systolic (congestive) heart failure: Secondary | ICD-10-CM | POA: Insufficient documentation

## 2021-02-18 DIAGNOSIS — I480 Paroxysmal atrial fibrillation: Secondary | ICD-10-CM | POA: Diagnosis not present

## 2021-02-18 LAB — BASIC METABOLIC PANEL
Anion gap: 8 (ref 5–15)
BUN: 36 mg/dL — ABNORMAL HIGH (ref 8–23)
CO2: 26 mmol/L (ref 22–32)
Calcium: 9.3 mg/dL (ref 8.9–10.3)
Chloride: 103 mmol/L (ref 98–111)
Creatinine, Ser: 2.03 mg/dL — ABNORMAL HIGH (ref 0.44–1.00)
GFR, Estimated: 25 mL/min — ABNORMAL LOW (ref >60)
Glucose, Bld: 97 mg/dL (ref 70–99)
Potassium: 5.3 mmol/L — ABNORMAL HIGH (ref 3.5–5.1)
Sodium: 137 mmol/L (ref 135–145)

## 2021-02-18 LAB — BRAIN NATRIURETIC PEPTIDE: B Natriuretic Peptide: 177.6 pg/mL — ABNORMAL HIGH (ref 0.0–100.0)

## 2021-02-18 MED ORDER — FENTANYL 50 MCG/HR TD PT72: 1.0000 | MEDICATED_PATCH | TRANSDERMAL | 0 refills | Status: DC

## 2021-03-07 DIAGNOSIS — I10 Essential (primary) hypertension: Secondary | ICD-10-CM | POA: Diagnosis not present

## 2021-03-07 DIAGNOSIS — I4819 Other persistent atrial fibrillation: Secondary | ICD-10-CM | POA: Diagnosis not present

## 2021-03-07 DIAGNOSIS — I5022 Chronic systolic (congestive) heart failure: Secondary | ICD-10-CM | POA: Diagnosis not present

## 2021-03-14 ENCOUNTER — Ambulatory Visit: Payer: Medicare Other

## 2021-03-14 ENCOUNTER — Other Ambulatory Visit
Admission: RE | Admit: 2021-03-14 | Discharge: 2021-03-14 | Disposition: A | Discharge status: Home / Self Care | Payer: Medicare Other | Source: Ambulatory Visit | Attending: Gerontology | Admitting: Gerontology

## 2021-03-14 DIAGNOSIS — I5022 Chronic systolic (congestive) heart failure: Secondary | ICD-10-CM | POA: Insufficient documentation

## 2021-03-14 LAB — BASIC METABOLIC PANEL
Anion gap: 7 (ref 5–15)
BUN: 34 mg/dL — ABNORMAL HIGH (ref 8–23)
CO2: 29 mmol/L (ref 22–32)
Calcium: 9.1 mg/dL (ref 8.9–10.3)
Chloride: 105 mmol/L (ref 98–111)
Creatinine, Ser: 1.81 mg/dL — ABNORMAL HIGH (ref 0.44–1.00)
GFR, Estimated: 28 mL/min — ABNORMAL LOW (ref >60)
Glucose, Bld: 104 mg/dL — ABNORMAL HIGH (ref 70–99)
Potassium: 4.6 mmol/L (ref 3.5–5.1)
Sodium: 141 mmol/L (ref 135–145)

## 2021-03-14 LAB — BRAIN NATRIURETIC PEPTIDE: B Natriuretic Peptide: 543.3 pg/mL — ABNORMAL HIGH (ref 0.0–100.0)

## 2021-03-22 DIAGNOSIS — Z01810 Encounter for preprocedural cardiovascular examination: Secondary | ICD-10-CM | POA: Diagnosis not present

## 2021-03-22 DIAGNOSIS — Z7901 Long term (current) use of anticoagulants: Secondary | ICD-10-CM | POA: Diagnosis not present

## 2021-03-22 DIAGNOSIS — I110 Hypertensive heart disease with heart failure: Secondary | ICD-10-CM | POA: Diagnosis not present

## 2021-03-22 DIAGNOSIS — I4819 Other persistent atrial fibrillation: Secondary | ICD-10-CM | POA: Diagnosis not present

## 2021-03-22 DIAGNOSIS — I10 Essential (primary) hypertension: Secondary | ICD-10-CM | POA: Diagnosis not present

## 2021-03-22 DIAGNOSIS — J90 Pleural effusion, not elsewhere classified: Secondary | ICD-10-CM | POA: Diagnosis not present

## 2021-03-22 DIAGNOSIS — R7303 Prediabetes: Secondary | ICD-10-CM | POA: Diagnosis not present

## 2021-03-22 DIAGNOSIS — I5032 Chronic diastolic (congestive) heart failure: Secondary | ICD-10-CM | POA: Diagnosis not present

## 2021-03-23 DIAGNOSIS — R9431 Abnormal electrocardiogram [ECG] [EKG]: Secondary | ICD-10-CM | POA: Diagnosis not present

## 2021-03-23 DIAGNOSIS — N183 Chronic kidney disease, stage 3 unspecified: Secondary | ICD-10-CM | POA: Diagnosis not present

## 2021-03-23 DIAGNOSIS — I5032 Chronic diastolic (congestive) heart failure: Secondary | ICD-10-CM | POA: Diagnosis not present

## 2021-03-23 DIAGNOSIS — F411 Generalized anxiety disorder: Secondary | ICD-10-CM | POA: Diagnosis not present

## 2021-03-23 DIAGNOSIS — I130 Hypertensive heart and chronic kidney disease with heart failure and stage 1 through stage 4 chronic kidney disease, or unspecified chronic kidney disease: Secondary | ICD-10-CM | POA: Diagnosis not present

## 2021-03-23 DIAGNOSIS — K219 Gastro-esophageal reflux disease without esophagitis: Secondary | ICD-10-CM | POA: Diagnosis not present

## 2021-03-23 DIAGNOSIS — Z87891 Personal history of nicotine dependence: Secondary | ICD-10-CM | POA: Diagnosis not present

## 2021-03-23 DIAGNOSIS — I4819 Other persistent atrial fibrillation: Secondary | ICD-10-CM | POA: Diagnosis not present

## 2021-03-23 DIAGNOSIS — E785 Hyperlipidemia, unspecified: Secondary | ICD-10-CM | POA: Diagnosis not present

## 2021-03-23 DIAGNOSIS — I483 Typical atrial flutter: Secondary | ICD-10-CM | POA: Diagnosis not present

## 2021-03-24 DIAGNOSIS — Z7901 Long term (current) use of anticoagulants: Secondary | ICD-10-CM | POA: Diagnosis not present

## 2021-03-24 DIAGNOSIS — I130 Hypertensive heart and chronic kidney disease with heart failure and stage 1 through stage 4 chronic kidney disease, or unspecified chronic kidney disease: Secondary | ICD-10-CM | POA: Diagnosis not present

## 2021-03-24 DIAGNOSIS — R58 Hemorrhage, not elsewhere classified: Secondary | ICD-10-CM | POA: Diagnosis not present

## 2021-03-24 DIAGNOSIS — I10 Essential (primary) hypertension: Secondary | ICD-10-CM | POA: Diagnosis not present

## 2021-03-24 DIAGNOSIS — Z87891 Personal history of nicotine dependence: Secondary | ICD-10-CM | POA: Diagnosis not present

## 2021-03-24 DIAGNOSIS — I97618 Postprocedural hemorrhage and hematoma of a circulatory system organ or structure following other circulatory system procedure: Secondary | ICD-10-CM | POA: Diagnosis not present

## 2021-03-24 DIAGNOSIS — I4891 Unspecified atrial fibrillation: Secondary | ICD-10-CM | POA: Diagnosis not present

## 2021-03-24 DIAGNOSIS — I5032 Chronic diastolic (congestive) heart failure: Secondary | ICD-10-CM | POA: Diagnosis not present

## 2021-03-24 DIAGNOSIS — N189 Chronic kidney disease, unspecified: Secondary | ICD-10-CM | POA: Diagnosis not present

## 2021-03-28 DIAGNOSIS — Z23 Encounter for immunization: Secondary | ICD-10-CM | POA: Diagnosis not present

## 2021-03-28 DIAGNOSIS — Z9889 Other specified postprocedural states: Secondary | ICD-10-CM | POA: Diagnosis not present

## 2021-03-28 DIAGNOSIS — I4819 Other persistent atrial fibrillation: Secondary | ICD-10-CM | POA: Diagnosis not present

## 2021-03-28 DIAGNOSIS — Z87891 Personal history of nicotine dependence: Secondary | ICD-10-CM | POA: Diagnosis not present

## 2021-03-28 DIAGNOSIS — J90 Pleural effusion, not elsewhere classified: Secondary | ICD-10-CM | POA: Diagnosis not present

## 2021-03-28 DIAGNOSIS — Z79899 Other long term (current) drug therapy: Secondary | ICD-10-CM | POA: Diagnosis not present

## 2021-03-29 ENCOUNTER — Other Ambulatory Visit
Admission: RE | Admit: 2021-03-29 | Discharge: 2021-03-29 | Disposition: A | Discharge status: Home / Self Care | Payer: Medicare Other | Attending: Emergency Medicine | Admitting: Emergency Medicine

## 2021-03-29 DIAGNOSIS — I5022 Chronic systolic (congestive) heart failure: Secondary | ICD-10-CM | POA: Insufficient documentation

## 2021-03-29 DIAGNOSIS — I480 Paroxysmal atrial fibrillation: Secondary | ICD-10-CM | POA: Insufficient documentation

## 2021-03-30 LAB — BASIC METABOLIC PANEL
Anion gap: 7 (ref 5–15)
BUN: 24 mg/dL — ABNORMAL HIGH (ref 8–23)
CO2: 29 mmol/L (ref 22–32)
Calcium: 9 mg/dL (ref 8.9–10.3)
Chloride: 106 mmol/L (ref 98–111)
Creatinine, Ser: 1.55 mg/dL — ABNORMAL HIGH (ref 0.44–1.00)
GFR, Estimated: 34 mL/min — ABNORMAL LOW (ref >60)
Glucose, Bld: 99 mg/dL (ref 70–99)
Potassium: 4.3 mmol/L (ref 3.5–5.1)
Sodium: 142 mmol/L (ref 135–145)

## 2021-03-31 ENCOUNTER — Other Ambulatory Visit: Payer: Self-pay | Admitting: Family Medicine

## 2021-03-31 DIAGNOSIS — Z1231 Encounter for screening mammogram for malignant neoplasm of breast: Secondary | ICD-10-CM

## 2021-04-11 ENCOUNTER — Other Ambulatory Visit: Payer: Self-pay | Admitting: Student

## 2021-04-11 ENCOUNTER — Ambulatory Visit
Admission: RE | Admit: 2021-04-11 | Discharge: 2021-04-11 | Disposition: A | Discharge status: Home / Self Care | Payer: Medicare Other | Attending: Student | Admitting: Student

## 2021-04-11 ENCOUNTER — Ambulatory Visit
Admission: RE | Admit: 2021-04-11 | Discharge: 2021-04-11 | Disposition: A | Discharge status: Home / Self Care | Payer: Medicare Other | Source: Ambulatory Visit | Attending: Student | Admitting: Student

## 2021-04-11 DIAGNOSIS — J90 Pleural effusion, not elsewhere classified: Secondary | ICD-10-CM

## 2021-04-13 DIAGNOSIS — I5022 Chronic systolic (congestive) heart failure: Secondary | ICD-10-CM | POA: Diagnosis not present

## 2021-04-13 DIAGNOSIS — I4819 Other persistent atrial fibrillation: Secondary | ICD-10-CM | POA: Diagnosis not present

## 2021-04-13 DIAGNOSIS — I10 Essential (primary) hypertension: Secondary | ICD-10-CM | POA: Diagnosis not present

## 2021-04-25 ENCOUNTER — Encounter: Payer: Medicare Other | Admitting: Family Medicine

## 2021-04-26 ENCOUNTER — Ambulatory Visit: Payer: Medicare Other | Admitting: Dermatology

## 2021-05-02 ENCOUNTER — Other Ambulatory Visit: Payer: Self-pay

## 2021-05-02 ENCOUNTER — Ambulatory Visit: Payer: Medicare Other | Admitting: Dermatology

## 2021-05-02 DIAGNOSIS — L578 Other skin changes due to chronic exposure to nonionizing radiation: Secondary | ICD-10-CM

## 2021-05-02 DIAGNOSIS — C44519 Basal cell carcinoma of skin of other part of trunk: Secondary | ICD-10-CM

## 2021-05-02 DIAGNOSIS — Z85828 Personal history of other malignant neoplasm of skin: Secondary | ICD-10-CM

## 2021-05-02 DIAGNOSIS — L820 Inflamed seborrheic keratosis: Secondary | ICD-10-CM | POA: Diagnosis not present

## 2021-05-02 DIAGNOSIS — D485 Neoplasm of uncertain behavior of skin: Secondary | ICD-10-CM

## 2021-05-02 DIAGNOSIS — Z872 Personal history of diseases of the skin and subcutaneous tissue: Secondary | ICD-10-CM

## 2021-05-02 DIAGNOSIS — C4491 Basal cell carcinoma of skin, unspecified: Secondary | ICD-10-CM

## 2021-05-02 HISTORY — DX: Basal cell carcinoma of skin, unspecified: C44.91

## 2021-05-02 HISTORY — DX: Personal history of other malignant neoplasm of skin: Z85.828

## 2021-05-02 NOTE — Progress Notes (Signed)
Follow-Up Visit   Subjective  Dawn Bruce is a 75 y.o. female who presents for the following: Skin Problem (Patient today with a spot at chest, present for about 1 year, looks like it has little blisters around it. Patient also with a pink spot at back, present for about 1 year. Patient does have a hx of SCC and AK's. ). The patient has spots, moles and lesions to be evaluated, some may be new or changing and the patient has concerns that these could be cancer.  Patient declines full body skin exam today.   The following portions of the chart were reviewed this encounter and updated as appropriate:   Tobacco  Allergies  Meds  Problems  Med Hx  Surg Hx  Fam Hx     Review of Systems:  No other skin or systemic complaints except as noted in HPI or Assessment and Plan.  Objective  Well appearing patient in no apparent distress; mood and affect are within normal limits.  A focused examination was performed including chest, back. Relevant physical exam findings are noted in the Assessment and Plan.  Chest x 2 (2) Erythematous keratotic or waxy stuck-on papule or plaque.   Right Upper Back Paraspinal 2.2 cm pink crusted patch R/o SCCis       Assessment & Plan  Inflamed seborrheic keratosis Chest x 2  Destruction of lesion - Chest x 2 Complexity: simple   Destruction method: cryotherapy   Informed consent: discussed and consent obtained   Timeout:  patient name, date of birth, surgical site, and procedure verified Lesion destroyed using liquid nitrogen: Yes   Region frozen until ice ball extended beyond lesion: Yes   Outcome: patient tolerated procedure well with no complications   Post-procedure details: wound care instructions given    Neoplasm of uncertain behavior of skin Right Upper Back Paraspinal  Epidermal / dermal shaving  Lesion diameter (cm):  2.2 Informed consent: discussed and consent obtained   Timeout: patient name, date of birth, surgical site,  and procedure verified   Procedure prep:  Patient was prepped and draped in usual sterile fashion Prep type:  Isopropyl alcohol Anesthesia: the lesion was anesthetized in a standard fashion   Anesthetic:  1% lidocaine w/ epinephrine 1-100,000 buffered w/ 8.4% NaHCO3 Instrument used: flexible razor blade   Hemostasis achieved with: pressure, aluminum chloride and electrodesiccation   Outcome: patient tolerated procedure well   Post-procedure details: sterile dressing applied and wound care instructions given   Dressing type: bandage and petrolatum    Destruction of lesion Complexity: extensive   Destruction method: electrodesiccation and curettage   Informed consent: discussed and consent obtained   Timeout:  patient name, date of birth, surgical site, and procedure verified Procedure prep:  Patient was prepped and draped in usual sterile fashion Prep type:  Isopropyl alcohol Anesthesia: the lesion was anesthetized in a standard fashion   Anesthetic:  1% lidocaine w/ epinephrine 1-100,000 buffered w/ 8.4% NaHCO3 Curettage performed in three different directions: Yes   Electrodesiccation performed over the curetted area: Yes   Lesion length (cm):  2.2 Lesion width (cm):  2.2 Margin per side (cm):  0.2 Final wound size (cm):  2.6 Hemostasis achieved with:  pressure, aluminum chloride and electrodesiccation Outcome: patient tolerated procedure well with no complications   Post-procedure details: sterile dressing applied and wound care instructions given   Dressing type: bandage and petrolatum    Specimen 1 - Surgical pathology Differential Diagnosis: R/o SCCis  Check Margins: No  2.2 cm pink crusted patch    Actinic Damage - chronic, secondary to cumulative UV radiation exposure/sun exposure over time - diffuse scaly erythematous macules with underlying dyspigmentation - Recommend daily broad spectrum sunscreen SPF 30+ to sun-exposed areas, reapply every 2 hours as needed.  -  Recommend staying in the shade or wearing long sleeves, sun glasses (UVA+UVB protection) and wide brim hats (4-inch brim around the entire circumference of the hat). - Call for new or changing lesions.  Return in about 6 months (around 10/31/2021) for TBSE.  Graciella Belton, RMA, am acting as scribe for Sarina Ser, MD . Documentation: I have reviewed the above documentation for accuracy and completeness, and I agree with the above.  Sarina Ser, MD

## 2021-05-02 NOTE — Patient Instructions (Addendum)
Cryotherapy Aftercare  Wash gently with soap and water everyday.   Apply Vaseline and Band-Aid daily until healed.   Wound Care Instructions  Cleanse wound gently with soap and water once a day then pat dry with clean gauze. Apply a thing coat of Petrolatum (petroleum jelly, "Vaseline") over the wound (unless you have an allergy to this). We recommend that you use a new, sterile tube of Vaseline. Do not pick or remove scabs. Do not remove the yellow or white "healing tissue" from the base of the wound.  Cover the wound with fresh, clean, nonstick gauze and secure with paper tape. You may use Band-Aids in place of gauze and tape if the would is small enough, but would recommend trimming much of the tape off as there is often too much. Sometimes Band-Aids can irritate the skin.  You should call the office for your biopsy report after 1 week if you have not already been contacted.  If you experience any problems, such as abnormal amounts of bleeding, swelling, significant bruising, significant pain, or evidence of infection, please call the office immediately.  FOR ADULT SURGERY PATIENTS: If you need something for pain relief you may take 1 extra strength Tylenol (acetaminophen) AND 2 Ibuprofen (200mg  each) together every 4 hours as needed for pain. (do not take these if you are allergic to them or if you have a reason you should not take them.) Typically, you may only need pain medication for 1 to 3 days.     Melanoma ABCDEs  Melanoma is the most dangerous type of skin cancer, and is the leading cause of death from skin disease.  You are more likely to develop melanoma if you: Have light-colored skin, light-colored eyes, or red or blond hair Spend a lot of time in the sun Tan regularly, either outdoors or in a tanning bed Have had blistering sunburns, especially during childhood Have a close family member who has had a melanoma Have atypical moles or large birthmarks  Early detection of  melanoma is key since treatment is typically straightforward and cure rates are extremely high if we catch it early.   The first sign of melanoma is often a change in a mole or a new dark spot.  The ABCDE system is a way of remembering the signs of melanoma.  A for asymmetry:  The two halves do not match. B for border:  The edges of the growth are irregular. C for color:  A mixture of colors are present instead of an even brown color. D for diameter:  Melanomas are usually (but not always) greater than 85mm - the size of a pencil eraser. E for evolution:  The spot keeps changing in size, shape, and color.  Please check your skin once per month between visits. You can use a small mirror in front and a large mirror behind you to keep an eye on the back side or your body.   If you see any new or changing lesions before your next follow-up, please call to schedule a visit.  Please continue daily skin protection including broad spectrum sunscreen SPF 30+ to sun-exposed areas, reapplying every 2 hours as needed when you're outdoors.    If You Need Anything After Your Visit  If you have any questions or concerns for your doctor, please call our main line at [phone] and press option 4 to reach your doctor's medical assistant. If no one answers, please leave a voicemail as directed and we will return your  call as soon as possible. Messages left after 4 pm will be answered the following business day.   You may also send [place] a message via [place]. We typically respond to MyChart messages within 1-2 business days.  For prescription refills, please ask your pharmacy to contact our office. Our fax number is [phone].  If you have an urgent issue when the clinic is closed that cannot wait until the next business day, you can page your doctor at the number below.    Please note that while we do our best to be available for urgent issues outside of office hours, we are not available 24/7.   If you  have an urgent issue and are unable to reach [place], you may choose to seek medical care at your doctor's office, retail clinic, urgent care center, or emergency room.  If you have a medical emergency, please immediately call 911 or go to the emergency department.  Pager Numbers  - Dr. Nehemiah Massed: [phone]  - Dr. Laurence Ferrari: [phone]  - Dr. Nicole Kindred: [phone]  In the event of inclement weather, please call our main line at [phone] for an update on the status of any delays or closures.  Dermatology Medication Tips: Please keep the boxes that topical medications come in in order to help keep track of the instructions about where and how to use these. Pharmacies typically print the medication instructions only on the boxes and not directly on the medication tubes.   If your medication is too expensive, please contact our office at [phone] option 4 or send [place] a message through [place].   We are unable to tell what your co-pay for medications will be in advance as this is different depending on your insurance coverage. However, we may be able to find a substitute medication at lower cost or fill out paperwork to get insurance to cover a needed medication.   If a prior authorization is required to get your medication covered by your insurance company, please allow [place] 1-2 business days to complete this process.  Drug prices often vary depending on where the prescription is filled and some pharmacies may offer cheaper prices.  The website www.goodrx.com contains coupons for medications through different pharmacies. The prices here do not account for what the cost may be with help from insurance (it may be cheaper with your insurance), but the website can give you the price if you did not use any insurance.  - You can print the associated coupon and take it with your prescription to the pharmacy.  - You may also stop by our office during regular business hours and pick up a GoodRx coupon  card.  - If you need your prescription sent electronically to a different pharmacy, notify our office through [facility] or by phone at [phone] option 4.     Si Usted Necesita Algo Despus de Su Visita  Tambin puede enviarnos un mensaje a travs de Pharmacist, community. Por lo general respondemos a los mensajes de MyChart en el transcurso de 1 a 2 das hbiles.  Para renovar recetas, por favor pida a su farmacia que se ponga en contacto con nuestra oficina. Harland Dingwall de fax es [place] [phone].  Si tiene un asunto urgente cuando la clnica est cerrada y que no puede esperar hasta el siguiente da hbil, puede llamar/localizar a su doctor(a) al nmero que aparece a continuacin.   Por favor, tenga en cuenta que aunque hacemos todo lo posible para estar disponibles para asuntos urgentes fuera  del horario de oficina, no estamos disponibles las 24 horas del da, los 7 das de la [place].   Si tiene un problema urgente y no puede comunicarse con nosotros, puede optar por buscar atencin mdica  en el consultorio de su doctor(a), en una clnica privada, en un centro de atencin urgente o en una sala de emergencias.  Si tiene Engineering geologist, por favor llame inmediatamente al 911 o vaya a la sala de emergencias.  Nmeros de bper  - Dr. Nehemiah Massed: [phone]  - Dra. Moye: [phone]  - Dra. Nicole Kindred: [phone]  En caso de inclemencias del [place], por favor llame a Johnsie Kindred principal al [phone] para una actualizacin sobre el [place] de cualquier retraso o cierre.  Consejos para la medicacin en dermatologa: Por favor, guarde las cajas en las que vienen los medicamentos de uso tpico para ayudarle a seguir las instrucciones sobre dnde y cmo usarlos. Las farmacias generalmente imprimen las instrucciones del medicamento slo en las cajas y no directamente en los tubos del [place].   Si su medicamento es muy caro, por favor, pngase en contacto con Zigmund Daniel llamando al [phone] y presione la opcin 4 o envenos un mensaje a travs de Pharmacist, community.   No podemos decirle cul ser su copago por los medicamentos por adelantado ya que esto es diferente dependiendo de la cobertura de su seguro. Sin embargo, es posible que podamos encontrar un medicamento sustituto a Electrical engineer un formulario para que el seguro cubra el medicamento que se considera necesario.   Si se requiere una autorizacin previa para que su compaa de seguros [place] su medicamento, por favor permtanos de 1 a 2 das hbiles para completar este proceso.  Los precios de los medicamentos varan con frecuencia dependiendo del Environmental consultant de dnde se surte la receta y alguna farmacias pueden ofrecer precios ms baratos.  El sitio web www.goodrx.com tiene cupones para medicamentos de Airline pilot. Los precios aqu no tienen en cuenta lo que podra costar con la ayuda del seguro (puede ser ms barato con su seguro), pero el sitio web puede darle el precio si no utiliz Research scientist (physical sciences).  - Puede imprimir el cupn correspondiente y llevarlo con su receta a la farmacia.  - Tambin puede pasar por nuestra oficina durante el horario de atencin regular y Charity fundraiser una tarjeta de cupones de GoodRx.  - Si necesita que su receta se enve electrnicamente a una farmacia diferente, informe a nuestra oficina a travs de MyChart de [place] o por telfono llamando al [phone] y presione la opcin 4.

## 2021-05-03 ENCOUNTER — Other Ambulatory Visit: Payer: Self-pay | Admitting: Family Medicine

## 2021-05-03 MED ORDER — FENTANYL 50 MCG/HR TD PT72: 1.0000 | MEDICATED_PATCH | TRANSDERMAL | 0 refills | Status: DC

## 2021-05-03 NOTE — Telephone Encounter (Signed)
LOV:12/03/20 LR: 02/18/21 Future Appt:05/10/21

## 2021-05-05 ENCOUNTER — Other Ambulatory Visit: Payer: Self-pay | Admitting: Family Medicine

## 2021-05-05 ENCOUNTER — Telehealth: Payer: Self-pay

## 2021-05-05 NOTE — Telephone Encounter (Signed)
It looks like Amlodipine was put on hold by Cardiology on 01/07/2021. Please advise whether patient is supposed to be on Amlodipine.

## 2021-05-05 NOTE — Telephone Encounter (Signed)
Patient informed of pathology results. She verbalized understanding and had no further questions.

## 2021-05-05 NOTE — Telephone Encounter (Signed)
-----   Message from Ralene Bathe, MD sent at 05/04/2021 12:54 PM EST ----- Diagnosis Skin , right upper back paraspinal SUPERFICIAL BASAL CELL CARCINOMA  Cancer - BCC Superficial Already treated Recheck next visit

## 2021-05-05 NOTE — Telephone Encounter (Signed)
If cardiology held amlodipine, she should check with them about whether she is supposed to resume this or not.

## 2021-05-07 ENCOUNTER — Encounter: Payer: Self-pay | Admitting: Dermatology

## 2021-05-09 NOTE — Progress Notes (Signed)
Established patient visit   Patient: Dawn Bruce   DOB: 04-Jun-1945   75 y.o. Female  MRN: [id number] Visit Date: 05/10/2021  Today's healthcare provider: Lavon Paganini, MD   Chief Complaint  Patient presents with   Ear Fullness   Subjective    HPI  Patient C/O ear fullness denies drainage. Patient reports runny nose, no cough.   L>R ear fullness x76m. Decreasing hearing in L ear.   Has needed ear irrigation many times in the past. Using OTC drops regualrly  Medications: Outpatient Medications Prior to Visit  Medication Sig   amiodarone (PACERONE) 200 MG tablet Take 200 mg by mouth daily.   apixaban (ELIQUIS) 5 MG TABS tablet Take 1 tablet (5 mg total) by mouth 2 (two) times daily.   atorvastatin (LIPITOR) 40 MG tablet Take 1 tablet (40 mg total) by mouth every evening.   carvedilol (COREG) 6.25 MG tablet Take 6.25 mg by mouth 2 (two) times daily.   Cholecalciferol (VITAMIN D3) 50 MCG (2000 UT) capsule Take 2,000 Units by mouth every evening.   empagliflozin (JARDIANCE) 10 MG TABS tablet Take 1 tablet by mouth daily.   famotidine (PEPCID) 40 MG tablet Take 1 tablet (40 mg total) by mouth daily.   fentaNYL (DURAGESIC) 50 MCG/HR Place 1 patch onto the skin every 3 (three) days.   Misc Natural Product [facility] EYE DROPS #1 OP) Place 1 drop into both eyes in the morning. Similasan Complete Eye Relief (Belladonna/Euphrasia/Mercurius Sublimatus)   pregabalin (LYRICA) 150 MG capsule Take 1 capsule (150 mg total) by mouth 2 (two) times daily.   sacubitril-valsartan (ENTRESTO) 97-103 MG Take 1 tablet by mouth every 12 (twelve) hours.   [DISCONTINUED] escitalopram (LEXAPRO) 10 MG tablet Take 1 tablet (10 mg total) by mouth daily.   [DISCONTINUED] metoprolol tartrate (LOPRESSOR) 50 MG tablet Take 1 tablet (50 mg total) by mouth 2 (two) times daily.   No facility-administered medications prior to visit.    Review of Systems  Constitutional:  Negative for activity change,  fatigue and fever.  HENT:  Positive for ear pain and rhinorrhea. Negative for congestion and ear discharge.   Respiratory:  Negative for cough.   Cardiovascular:  Negative for chest pain and palpitations.      Objective    BP (!) 157/73 (BP Location: Right Arm, Patient Position: Sitting, Cuff Size: Large)    Pulse 60    Temp (!) 97.5 F (36.4 C) (Oral)    Resp 16    Wt 125 lb 6.4 oz (56.9 kg)    SpO2 99%    BMI 22.21 kg/m  {Show previous vital signs (optional):23777}  Physical Exam Constitutional:      General: She is not in acute distress.    Appearance: Normal appearance.  HENT:     Head: Normocephalic and atraumatic.     Right Ear: There is impacted cerumen.     Left Ear: There is impacted cerumen.  Pulmonary:     Effort: Pulmonary effort is normal. No respiratory distress.  Neurological:     Mental Status: She is alert and oriented to person, place, and time. Mental status is at baseline.      No results found for any visits on 05/10/21.  Assessment & Plan     1. Bilateral impacted cerumen - ear irrigation completed in clinic today  - able to visualized TMs after irrigation  No follow-ups on file. F/u as scheduled     I, Lavon Paganini, MD, have  reviewed all documentation for this visit. The documentation on 05/10/21 for the exam, diagnosis, procedures, and orders are all accurate and complete.   Shandel Busic, Dionne Bucy, MD, MPH [facility]

## 2021-05-10 ENCOUNTER — Encounter: Payer: Self-pay | Admitting: Family Medicine

## 2021-05-10 ENCOUNTER — Other Ambulatory Visit: Payer: Self-pay

## 2021-05-10 ENCOUNTER — Ambulatory Visit (INDEPENDENT_AMBULATORY_CARE_PROVIDER_SITE_OTHER): Payer: Medicare Other | Admitting: Family Medicine

## 2021-05-10 VITALS — BP 157/73 | HR 60 | Temp 97.5°F | Resp 16 | Wt 125.4 lb

## 2021-05-10 DIAGNOSIS — H6123 Impacted cerumen, bilateral: Secondary | ICD-10-CM | POA: Diagnosis not present

## 2021-06-02 DIAGNOSIS — R7303 Prediabetes: Secondary | ICD-10-CM | POA: Diagnosis not present

## 2021-06-02 DIAGNOSIS — I4819 Other persistent atrial fibrillation: Secondary | ICD-10-CM | POA: Diagnosis not present

## 2021-06-02 DIAGNOSIS — I10 Essential (primary) hypertension: Secondary | ICD-10-CM | POA: Diagnosis not present

## 2021-06-02 DIAGNOSIS — Z01810 Encounter for preprocedural cardiovascular examination: Secondary | ICD-10-CM | POA: Diagnosis not present

## 2021-06-02 DIAGNOSIS — I5032 Chronic diastolic (congestive) heart failure: Secondary | ICD-10-CM | POA: Diagnosis not present

## 2021-06-02 DIAGNOSIS — I5022 Chronic systolic (congestive) heart failure: Secondary | ICD-10-CM | POA: Diagnosis not present

## 2021-06-02 DIAGNOSIS — I480 Paroxysmal atrial fibrillation: Secondary | ICD-10-CM | POA: Diagnosis not present

## 2021-06-02 DIAGNOSIS — Z7901 Long term (current) use of anticoagulants: Secondary | ICD-10-CM | POA: Diagnosis not present

## 2021-06-03 ENCOUNTER — Ambulatory Visit
Admission: RE | Admit: 2021-06-03 | Discharge: 2021-06-03 | Disposition: A | Discharge status: Home / Self Care | Payer: Medicare Other | Source: Ambulatory Visit | Attending: Family Medicine | Admitting: Family Medicine

## 2021-06-03 ENCOUNTER — Other Ambulatory Visit: Payer: Self-pay

## 2021-06-03 DIAGNOSIS — Z1231 Encounter for screening mammogram for malignant neoplasm of breast: Secondary | ICD-10-CM | POA: Insufficient documentation

## 2021-06-30 ENCOUNTER — Other Ambulatory Visit: Payer: Self-pay | Admitting: Family Medicine

## 2021-06-30 MED ORDER — FENTANYL 50 MCG/HR TD PT72: 1.0000 | MEDICATED_PATCH | TRANSDERMAL | 0 refills | Status: DC

## 2021-07-01 NOTE — Progress Notes (Signed)
Established patient visit   Patient: Dawn Bruce   DOB: [birth year]   76 y.o. Female  MRN: [id number] Visit Date: 07/04/2021  Today's healthcare provider: Lavon Paganini, MD   Chief Complaint  Patient presents with   Abdominal Pain   I,Sulibeya S Dimas,acting as a scribe for Lavon Paganini, MD.,have documented all relevant documentation on the behalf of Lavon Paganini, MD,as directed by  Lavon Paganini, MD while in the presence of Lavon Paganini, MD.  Subjective    Abdominal Pain This is a recurrent problem. Episode onset: a few months. The onset quality is sudden. The problem occurs every several days. The problem has been unchanged. The pain is located in the left flank and LUQ. The patient is experiencing no pain. The quality of the pain is a sensation of fullness and colicky. The abdominal pain radiates to the right flank. Associated symptoms include flatus, nausea and vomiting. Pertinent negatives include no belching, constipation, diarrhea, dysuria, fever, hematuria or weight loss. Exacerbated by: stress, bending over. The pain is relieved by Nothing. Treatments tried: tums, pepcid and gasX. The treatment provided no relief.    Will happen intermittently. No known triggers Can hurt with bending, not associated with eating.  No blood in stools, no constipation, no diarrhea, no fever.  Last colonoscopy in 2014 with 1 33mm polyp, was due for repeat in 2019. Did not have it done.   Medications: Outpatient Medications Prior to Visit  Medication Sig   apixaban (ELIQUIS) 5 MG TABS tablet Take 1 tablet (5 mg total) by mouth 2 (two) times daily.   atorvastatin (LIPITOR) 40 MG tablet Take 1 tablet (40 mg total) by mouth every evening.   carvedilol (COREG) 6.25 MG tablet Take 6.25 mg by mouth 2 (two) times daily.   empagliflozin (JARDIANCE) 10 MG TABS tablet Take 1 tablet by mouth daily.   famotidine (PEPCID) 40 MG tablet Take 1 tablet (40 mg total) by mouth daily.    fentaNYL (DURAGESIC) 50 MCG/HR Place 1 patch onto the skin every 3 (three) days.   pregabalin (LYRICA) 150 MG capsule Take 1 capsule (150 mg total) by mouth 2 (two) times daily.   [DISCONTINUED] amiodarone (PACERONE) 200 MG tablet Take 200 mg by mouth daily.   [DISCONTINUED] Cholecalciferol (VITAMIN D3) 50 MCG (2000 UT) capsule Take 2,000 Units by mouth every evening.   [DISCONTINUED] Misc Natural Product [facility] EYE DROPS #1 OP) Place 1 drop into both eyes in the morning. Similasan Complete Eye Relief (Belladonna/Euphrasia/Mercurius Sublimatus)   [DISCONTINUED] sacubitril-valsartan (ENTRESTO) 97-103 MG Take 1 tablet by mouth every 12 (twelve) hours.   No facility-administered medications prior to visit.    Review of Systems  Constitutional:  Negative for appetite change, chills, fever, unexpected weight change and weight loss.  Gastrointestinal:  Positive for abdominal pain, flatus, nausea and vomiting. Negative for blood in stool, constipation and diarrhea.  Genitourinary:  Negative for dysuria and hematuria.       Objective    BP (!) 198/104 (BP Location: Right Arm, Patient Position: Sitting, Cuff Size: Large)    Pulse 60    Temp 97.6 F (36.4 C) (Temporal)    Resp 16    Wt 123 lb (55.8 kg)    BMI 21.79 kg/m  BP Readings from Last 3 Encounters:  07/04/21 (!) 198/104  05/10/21 (!) 157/73  01/07/21 112/60   Wt Readings from Last 3 Encounters:  07/04/21 123 lb (55.8 kg)  05/10/21 125 lb 6.4 oz (56.9 kg)  01/07/21  128 lb (58.1 kg)      Physical Exam Vitals reviewed.  Constitutional:      General: She is not in acute distress.    Appearance: Normal appearance. She is well-developed. She is not diaphoretic.  HENT:     Head: Normocephalic and atraumatic.  Eyes:     General: No scleral icterus.    Conjunctiva/sclera: Conjunctivae normal.  Neck:     Thyroid: No thyromegaly.  Cardiovascular:     Rate and Rhythm: Normal rate and regular rhythm.     Pulses: Normal pulses.      Heart sounds: Normal heart sounds. No murmur heard. Pulmonary:     Effort: Pulmonary effort is normal. No respiratory distress.     Breath sounds: Normal breath sounds. No wheezing, rhonchi or rales.  Abdominal:     General: Abdomen is flat. Bowel sounds are normal. There is no distension.     Palpations: Abdomen is soft. There is no hepatomegaly, splenomegaly or mass.     Tenderness: There is abdominal tenderness in the left upper quadrant. There is no right CVA tenderness, left CVA tenderness or rebound.  Musculoskeletal:     Cervical back: Neck supple.     Right lower leg: No edema.     Left lower leg: No edema.  Lymphadenopathy:     Cervical: No cervical adenopathy.  Skin:    General: Skin is warm and dry.     Findings: No rash.  Neurological:     Mental Status: She is alert and oriented to person, place, and time. Mental status is at baseline.  Psychiatric:        Mood and Affect: Mood normal.        Behavior: Behavior normal.      No results found for any visits on 07/04/21.  Assessment & Plan     Problem List Items Addressed This Visit       Cardiovascular and Mediastinum   Essential hypertension    BP remains elevated today She is under a lot of stress and will check home BPs as she settles back into her routine She is asymptomatic - discussed red flags and emergent precautions No changes in meds for now but may need to consider        Other   LUQ abdominal pain - Primary    New problem No changes in BMs She does have tenderness and some mild guarding on exam Warrants CT to investigate further Is overdue for colonoscopy - so will refer to GI as well Differential is broad, but further treatment pending workup as ordered      Relevant Orders   CT Abdomen Pelvis W Contrast   Ambulatory referral to Gastroenterology     Return in about 2 months (around 09/01/2021) for as scheduled.      I, Lavon Paganini, MD, have reviewed all documentation for  this visit. The documentation on 07/04/21 for the exam, diagnosis, procedures, and orders are all accurate and complete.   Analee Montee, Dionne Bucy, MD, MPH [facility]

## 2021-07-04 ENCOUNTER — Encounter: Payer: Self-pay | Admitting: Family Medicine

## 2021-07-04 ENCOUNTER — Other Ambulatory Visit: Payer: Self-pay

## 2021-07-04 ENCOUNTER — Ambulatory Visit (INDEPENDENT_AMBULATORY_CARE_PROVIDER_SITE_OTHER): Payer: Medicare Other | Admitting: Family Medicine

## 2021-07-04 VITALS — BP 198/104 | HR 60 | Temp 97.6°F | Resp 16 | Wt 123.0 lb

## 2021-07-04 DIAGNOSIS — I10 Essential (primary) hypertension: Secondary | ICD-10-CM

## 2021-07-04 DIAGNOSIS — R1012 Left upper quadrant pain: Secondary | ICD-10-CM

## 2021-07-04 NOTE — Assessment & Plan Note (Addendum)
New problem No changes in BMs She does have tenderness and some mild guarding on exam Warrants CT to investigate further Is overdue for colonoscopy - so will refer to GI as well Differential is broad, but further treatment pending workup as ordered

## 2021-07-04 NOTE — Assessment & Plan Note (Signed)
BP remains elevated today She is under a lot of stress and will check home BPs as she settles back into her routine She is asymptomatic - discussed red flags and emergent precautions No changes in meds for now but may need to consider

## 2021-07-26 ENCOUNTER — Encounter: Payer: Self-pay | Admitting: Family Medicine

## 2021-08-03 ENCOUNTER — Encounter: Payer: Self-pay | Admitting: Family Medicine

## 2021-08-11 DIAGNOSIS — R81 Glycosuria: Secondary | ICD-10-CM | POA: Diagnosis not present

## 2021-08-11 DIAGNOSIS — R1031 Right lower quadrant pain: Secondary | ICD-10-CM | POA: Diagnosis not present

## 2021-08-22 ENCOUNTER — Other Ambulatory Visit: Payer: Self-pay | Admitting: Family Medicine

## 2021-08-23 NOTE — Telephone Encounter (Signed)
LOV 07-04-21 ?LR 02-07-21 ?NOV 09-05-21 ?

## 2021-08-25 ENCOUNTER — Telehealth: Payer: Self-pay | Admitting: Family Medicine

## 2021-08-25 ENCOUNTER — Encounter: Payer: Self-pay | Admitting: Family Medicine

## 2021-08-25 MED ORDER — ATORVASTATIN CALCIUM 40 MG PO TABS: 40.0000 mg | ORAL_TABLET | Freq: Every evening | ORAL | 1 refills | Status: DC

## 2021-08-25 NOTE — Telephone Encounter (Signed)
Please find her a time to be seen by me or someone else for her abd pain please. Remember that floats may have openings too ?

## 2021-08-25 NOTE — Telephone Encounter (Signed)
[facility] faxed refill request for the following medications:  atorvastatin (LIPITOR) 40 MG tablet   Please advise.  

## 2021-08-29 ENCOUNTER — Ambulatory Visit: Payer: Medicare Other | Admitting: Dermatology

## 2021-08-29 ENCOUNTER — Encounter: Payer: Self-pay | Admitting: Physician Assistant

## 2021-08-29 ENCOUNTER — Ambulatory Visit (INDEPENDENT_AMBULATORY_CARE_PROVIDER_SITE_OTHER): Payer: Medicare Other | Admitting: Physician Assistant

## 2021-08-29 VITALS — BP 133/58 | HR 68 | Temp 98.0°F | Resp 14 | Wt 124.0 lb

## 2021-08-29 DIAGNOSIS — Z1211 Encounter for screening for malignant neoplasm of colon: Secondary | ICD-10-CM | POA: Diagnosis not present

## 2021-08-29 DIAGNOSIS — R1011 Right upper quadrant pain: Secondary | ICD-10-CM

## 2021-08-29 DIAGNOSIS — N1832 Chronic kidney disease, stage 3b: Secondary | ICD-10-CM | POA: Diagnosis not present

## 2021-08-29 DIAGNOSIS — R1031 Right lower quadrant pain: Secondary | ICD-10-CM

## 2021-08-29 DIAGNOSIS — G8929 Other chronic pain: Secondary | ICD-10-CM

## 2021-08-29 DIAGNOSIS — I4819 Other persistent atrial fibrillation: Secondary | ICD-10-CM

## 2021-08-29 NOTE — Progress Notes (Signed)
?  ? ?I,Roshena L Chambers,acting as a Education administrator for [employer], PA-C.,have documented all relevant documentation on the behalf of Mardene Speak, PA-C,as directed by  [employer], PA-C while in the presence of [employer], PA-C.  ? ? ? ?Established patient visit ? ? ?Patient: Dawn Bruce   DOB: Mar 15, 1946   76 y.o. Female  MRN: [id number] ?Visit Date: 08/29/2021 ? ?Today's healthcare provider: Mardene Speak, PA-C  ? ?Chief Complaint  ?Patient presents with  ? Abdominal Pain  ? ?Subjective  ?  ?Abdominal Pain ?This is a new problem. Episode onset: 1 month ago. The problem occurs intermittently. The problem has been rapidly worsening. Pain location: right side of abdomen. Quality: soreness. Pertinent negatives include no constipation, diarrhea, fever, nausea or vomiting. The pain is relieved by Nothing. She has tried nothing for the symptoms.   ?Pain worsens when getting out of bed.  In the past, treatments tried: tums, pepcid and gasX. The treatment provided no relief.  ? ?Patient saw Dr. Brita Romp on 2.13.23 for LUQ pain. Was referred for imaging in [place], [place]. She travelled recently to [place]. An appt was scheduled for CT.But pain subsided and patient canceled an appt. ?Pain returned at the same day but in the RUQ/RLQ . Pain is constant, 2-3-4-5/1, not associated with eating except last night. She had a wonton soup with crispy rice noodles, a devil egg, dumplings and felt that pain worsened after this meal. States that  the pain reminds her a "bad gas pain." Endorses having bloating, flatulence.  Denies having any changes in consistency of stool or having foul-smelling, bulky, greasy stool.  Reports vague pain -occasionally-"across the entire abdomen." ? ?Denies having problems with bowel movements and urination. ?Patient denies having blood in stool or urine. Denies having hx of kidney stones, appendicitis, IBD, HA, AAA ?Has hx of persistent AF, currently on apixaban ? ?Endorses having back pain. Denies pain  radiation.Denies having similar pain before. Denies anyone in her family has a similar pain ?  Per chart review,  ?Last colonoscopy in 2014 with 1 30m polyp, was due for repeat in 2019. Referrals was sent to Dr. MLondell Mohand patient was scheduled with JBernell Liston 08/08/2021 but pt has canceled appointment  ? ?Last appt with Cardiology was on 06/02/21: ?Patient has had recovery of LVEF with continued grade 3 diastolic dysfunction. Taking Jardiance and Valsartan. NTproBNP elevated at 7974. ?Continue carvedilol. Does have longstanding hx of uncontrolled BP  ?Baseline creatinine 1.2-1.6  FU was scheduled for 09/05/2021 ? ? ? ?Medications: ?Outpatient Medications Prior to Visit  ?Medication Sig  ? apixaban (ELIQUIS) 5 MG TABS tablet Take 1 tablet (5 mg total) by mouth 2 (two) times daily.  ? atorvastatin (LIPITOR) 40 MG tablet Take 1 tablet (40 mg total) by mouth every evening.  ? carvedilol (COREG) 6.25 MG tablet Take 6.25 mg by mouth 2 (two) times daily.  ? empagliflozin (JARDIANCE) 10 MG TABS tablet Take 1 tablet by mouth daily.  ? famotidine (PEPCID) 40 MG tablet Take 1 tablet (40 mg total) by mouth daily.  ? fentaNYL (DURAGESIC) 50 MCG/HR Place 1 patch onto the skin every 3 (three) days.  ? pregabalin (LYRICA) 150 MG capsule TAKE 1 CAPSULE(150 MG) BY MOUTH TWICE DAILY  ? ?No facility-administered medications prior to visit.  ? ? ?Review of Systems  ?Constitutional:  Negative for appetite change, chills, fatigue and fever.  ?Respiratory:  Negative for chest tightness and shortness of breath.   ?Cardiovascular:  Negative for chest pain  and palpitations.  ?Gastrointestinal:  Positive for abdominal pain. Negative for constipation, diarrhea, nausea and vomiting.  ?Neurological:  Negative for dizziness and weakness.  ?See HPI ? ?  Objective  ?  ?BP (!) 133/58 (BP Location: Right Arm, Patient Position: Sitting, Cuff Size: Normal)   Pulse 68   Temp 98 ?F (36.7 ?C) (Oral)   Resp 14   Wt 124 lb (56.2 kg)   SpO2  100% Comment: room air  BMI 21.97 kg/m?  ? ? ?Physical Exam ?Vitals and nursing note reviewed.  ?Constitutional:   ?   Appearance: She is well-developed.  ?Cardiovascular:  ?   Rate and Rhythm: Normal rate.  ?Pulmonary:  ?   Effort: Pulmonary effort is normal.  ?Abdominal:  ?   General: Abdomen is flat. Bowel sounds are normal. There is no distension.  ?   Palpations: There is no shifting dullness, fluid wave, hepatomegaly, splenomegaly, mass or pulsatile mass.  ?   Tenderness: There is generalized abdominal tenderness and tenderness in the right upper quadrant and right lower quadrant. There is guarding. There is no right CVA tenderness or rebound. Positive signs include obturator sign. Negative signs include Murphy's sign, Rovsing's sign and McBurney's sign.  ?Neurological:  ?   Mental Status: She is alert.  ?  ? ? ?No results found for any visits on 08/29/21. ? Assessment & Plan  ?  ? ?1. Chronic generalized abdominal pain ?X 2 mo ?- CBC w/Diff/Platelet ?- Comprehensive Metabolic Panel (CMET) ?- Ambulatory referral to Gastroenterology for evaluation of her chronic abdominal pain and colonoscopy ?- Planned to order urinalysis but patient did not have any urinary symptoms and patient disagreed. ? ?2. Right lower quadrant abdominal pain ?>RUQ pain x 1 mo ?Normal Vitals. No changes in Bms, Normal BS on auscultation, tenderness and mild voluntary guarding (RLQ) on exam, positive obturator sign ?- CT Abdomen Pelvis WO Contrast; Future/due to stage 3b CKD might not tolerate well contrast ? ?3. Screening for colon cancer ?Last colonoscopy in 2014 with 1 46m polyp, was due for repeat in 2019.   ?- Ambulatory referral to Gastroenterology for evaluation of her chronic abdominal pain and colonoscopy ? ?4. Persistent atrial fibrillation ([facility] ?Stable. Chronic.On apixaban ?Followed up with Cardiology on 08/31/21 ? ?5. Stage 3b chronic kidney disease ([place] ?Stable. Chronic.  ?Baseline creatinine 1.2-1.6  ?eGFR was 36 on  06/02/21 ?Scheduled with Nephrology on 09/05/21 ? ?Fu with her PCP on 09/05/21 ?   ?The patient was advised to call back or seek an in-person evaluation if the symptoms worsen or if the condition fails to improve as anticipated. ? ?I discussed the assessment and treatment plan with the patient. The patient was provided an opportunity to ask questions and all were answered. The patient agreed with the plan and demonstrated an understanding of the instructions. ? ?The entirety of the information documented in the History of Present Illness, Review of Systems and Physical Exam were personally obtained by me. Portions of this information were initially documented by the CMA and reviewed by me for thoroughness and accuracy.   ? ? ?JMardene Speak PA-C  ?[employer]?[phone](phone) ?[phone](fax) ? ?[facility] ?

## 2021-08-30 ENCOUNTER — Other Ambulatory Visit: Payer: Self-pay | Admitting: Physician Assistant

## 2021-08-30 ENCOUNTER — Ambulatory Visit: Payer: Medicare Other | Admitting: Physician Assistant

## 2021-08-30 ENCOUNTER — Encounter: Payer: Self-pay | Admitting: Physician Assistant

## 2021-08-30 ENCOUNTER — Encounter: Payer: Self-pay | Admitting: Family Medicine

## 2021-08-30 DIAGNOSIS — G8929 Other chronic pain: Secondary | ICD-10-CM | POA: Diagnosis not present

## 2021-08-30 DIAGNOSIS — R1031 Right lower quadrant pain: Secondary | ICD-10-CM | POA: Diagnosis not present

## 2021-08-30 DIAGNOSIS — R1084 Generalized abdominal pain: Secondary | ICD-10-CM

## 2021-08-30 NOTE — Progress Notes (Unsigned)
Dawn Bruce, if you pain worsens please, go to ER for your evaluation including CT scan. ?

## 2021-08-31 ENCOUNTER — Encounter: Payer: Self-pay | Admitting: Emergency Medicine

## 2021-08-31 ENCOUNTER — Emergency Department: Payer: Medicare Other

## 2021-08-31 ENCOUNTER — Inpatient Hospital Stay
Admission: EM | Admit: 2021-08-31 | Discharge: 2021-09-02 | DRG: 372 | Disposition: A | Discharge status: Home / Self Care | Payer: Medicare Other | Attending: Surgery | Admitting: Surgery

## 2021-08-31 ENCOUNTER — Inpatient Hospital Stay: Payer: Medicare Other

## 2021-08-31 ENCOUNTER — Other Ambulatory Visit: Payer: Self-pay

## 2021-08-31 DIAGNOSIS — M47815 Spondylosis without myelopathy or radiculopathy, thoracolumbar region: Secondary | ICD-10-CM | POA: Diagnosis not present

## 2021-08-31 DIAGNOSIS — R1031 Right lower quadrant pain: Secondary | ICD-10-CM | POA: Diagnosis not present

## 2021-08-31 DIAGNOSIS — Z85828 Personal history of other malignant neoplasm of skin: Secondary | ICD-10-CM

## 2021-08-31 DIAGNOSIS — Z7901 Long term (current) use of anticoagulants: Secondary | ICD-10-CM

## 2021-08-31 DIAGNOSIS — Z87891 Personal history of nicotine dependence: Secondary | ICD-10-CM | POA: Diagnosis not present

## 2021-08-31 DIAGNOSIS — I4819 Other persistent atrial fibrillation: Secondary | ICD-10-CM | POA: Diagnosis present

## 2021-08-31 DIAGNOSIS — Z79891 Long term (current) use of opiate analgesic: Secondary | ICD-10-CM

## 2021-08-31 DIAGNOSIS — K3533 Acute appendicitis with perforation and localized peritonitis, with abscess: Principal | ICD-10-CM | POA: Diagnosis present

## 2021-08-31 DIAGNOSIS — Z90710 Acquired absence of both cervix and uterus: Secondary | ICD-10-CM

## 2021-08-31 DIAGNOSIS — K5730 Diverticulosis of large intestine without perforation or abscess without bleeding: Secondary | ICD-10-CM | POA: Diagnosis not present

## 2021-08-31 DIAGNOSIS — N183 Chronic kidney disease, stage 3 unspecified: Secondary | ICD-10-CM | POA: Diagnosis present

## 2021-08-31 DIAGNOSIS — E785 Hyperlipidemia, unspecified: Secondary | ICD-10-CM | POA: Diagnosis present

## 2021-08-31 DIAGNOSIS — Z79899 Other long term (current) drug therapy: Secondary | ICD-10-CM

## 2021-08-31 DIAGNOSIS — K651 Peritoneal abscess: Secondary | ICD-10-CM | POA: Diagnosis not present

## 2021-08-31 DIAGNOSIS — I129 Hypertensive chronic kidney disease with stage 1 through stage 4 chronic kidney disease, or unspecified chronic kidney disease: Secondary | ICD-10-CM | POA: Diagnosis present

## 2021-08-31 DIAGNOSIS — R109 Unspecified abdominal pain: Secondary | ICD-10-CM | POA: Diagnosis not present

## 2021-08-31 DIAGNOSIS — Z7984 Long term (current) use of oral hypoglycemic drugs: Secondary | ICD-10-CM

## 2021-08-31 DIAGNOSIS — K3532 Acute appendicitis with perforation and localized peritonitis, without abscess: Secondary | ICD-10-CM | POA: Diagnosis present

## 2021-08-31 DIAGNOSIS — G8929 Other chronic pain: Secondary | ICD-10-CM | POA: Diagnosis not present

## 2021-08-31 DIAGNOSIS — N179 Acute kidney failure, unspecified: Secondary | ICD-10-CM | POA: Diagnosis present

## 2021-08-31 LAB — CBG MONITORING, ED
Glucose-Capillary: 97 mg/dL (ref 70–99)
Glucose-Capillary: 72 mg/dL (ref 70–99)

## 2021-08-31 LAB — CBC WITH DIFFERENTIAL/PLATELET
Basophils Absolute: 0 10*3/uL (ref 0.0–0.2)
Basos: 0 %
EOS (ABSOLUTE): 0 10*3/uL (ref 0.0–0.4)
Eos: 0 %
Hematocrit: 37.9 % (ref 34.0–46.6)
Hemoglobin: 12.5 g/dL (ref 11.1–15.9)
Immature Grans (Abs): 0.1 10*3/uL (ref 0.0–0.1)
Immature Granulocytes: 1 %
Lymphocytes Absolute: 0.7 10*3/uL (ref 0.7–3.1)
Lymphs: 5 %
MCH: 28.3 pg (ref 26.6–33.0)
MCHC: 33 g/dL (ref 31.5–35.7)
MCV: 86 fL (ref 79–97)
Monocytes Absolute: 1.6 10*3/uL — ABNORMAL HIGH (ref 0.1–0.9)
Monocytes: 10 %
Neutrophils Absolute: 13.2 10*3/uL — ABNORMAL HIGH (ref 1.4–7.0)
Neutrophils: 84 %
Platelets: 222 10*3/uL (ref 150–450)
RBC: 4.41 x10E6/uL (ref 3.77–5.28)
RDW: 14.5 % (ref 11.7–15.4)
WBC: 15.6 10*3/uL — ABNORMAL HIGH (ref 3.4–10.8)

## 2021-08-31 LAB — COMPREHENSIVE METABOLIC PANEL
ALT: 15 IU/L (ref 0–32)
ALT: 14 U/L (ref 0–44)
AST: 26 IU/L (ref 0–40)
AST: 23 U/L (ref 15–41)
Albumin/Globulin Ratio: 1.1 — ABNORMAL LOW (ref 1.2–2.2)
Albumin: 3 g/dL — ABNORMAL LOW (ref 3.7–4.7)
Albumin: 2.5 g/dL — ABNORMAL LOW (ref 3.5–5.0)
Alkaline Phosphatase: 100 IU/L (ref 44–121)
Alkaline Phosphatase: 76 U/L (ref 38–126)
Anion gap: 5 (ref 5–15)
BUN/Creatinine Ratio: 14 (ref 12–28)
BUN: 25 mg/dL (ref 8–27)
BUN: 29 mg/dL — ABNORMAL HIGH (ref 8–23)
Bilirubin Total: 0.5 mg/dL (ref 0.0–1.2)
CO2: 25 mmol/L (ref 20–29)
CO2: 28 mmol/L (ref 22–32)
Calcium: 8.7 mg/dL (ref 8.7–10.3)
Calcium: 8.5 mg/dL — ABNORMAL LOW (ref 8.9–10.3)
Chloride: 103 mmol/L (ref 96–106)
Chloride: 103 mmol/L (ref 98–111)
Creatinine, Ser: 1.83 mg/dL — ABNORMAL HIGH (ref 0.57–1.00)
Creatinine, Ser: 1.94 mg/dL — ABNORMAL HIGH (ref 0.44–1.00)
GFR, Estimated: 26 mL/min — ABNORMAL LOW (ref >60)
Globulin, Total: 2.7 g/dL (ref 1.5–4.5)
Glucose, Bld: 128 mg/dL — ABNORMAL HIGH (ref 70–99)
Glucose: 111 mg/dL — ABNORMAL HIGH (ref 70–99)
Potassium: 4.5 mmol/L (ref 3.5–5.2)
Potassium: 4 mmol/L (ref 3.5–5.1)
Sodium: 141 mmol/L (ref 134–144)
Sodium: 136 mmol/L (ref 135–145)
Total Bilirubin: 0.7 mg/dL (ref 0.3–1.2)
Total Protein: 5.7 g/dL — ABNORMAL LOW (ref 6.0–8.5)
Total Protein: 6.4 g/dL — ABNORMAL LOW (ref 6.5–8.1)
eGFR: 28 mL/min/{1.73_m2} — ABNORMAL LOW (ref >59)

## 2021-08-31 LAB — URINALYSIS, COMPLETE (UACMP) WITH MICROSCOPIC
Bilirubin Urine: NEGATIVE
Glucose, UA: >=500 mg/dL — AB
Ketones, ur: NEGATIVE mg/dL
Leukocytes,Ua: NEGATIVE
Nitrite: NEGATIVE
Protein, ur: 30 mg/dL — AB
Specific Gravity, Urine: 1.036 — ABNORMAL HIGH (ref 1.005–1.030)
pH: 6 (ref 5.0–8.0)

## 2021-08-31 LAB — CBC
HCT: 37 % (ref 36.0–46.0)
Hemoglobin: 11.9 g/dL — ABNORMAL LOW (ref 12.0–15.0)
MCH: 28.4 pg (ref 26.0–34.0)
MCHC: 32.2 g/dL (ref 30.0–36.0)
MCV: 88.3 fL (ref 80.0–100.0)
Platelets: 214 10*3/uL (ref 150–400)
RBC: 4.19 MIL/uL (ref 3.87–5.11)
RDW: 15.1 % (ref 11.5–15.5)
WBC: 15 10*3/uL — ABNORMAL HIGH (ref 4.0–10.5)
nRBC: 0 % (ref 0.0–0.2)

## 2021-08-31 LAB — GLUCOSE, CAPILLARY: Glucose-Capillary: 72 mg/dL (ref 70–99)

## 2021-08-31 LAB — HEMOGLOBIN A1C
Hgb A1c MFr Bld: 6.5 % — ABNORMAL HIGH (ref 4.8–5.6)
Mean Plasma Glucose: 139.85 mg/dL

## 2021-08-31 MED ORDER — INSULIN ASPART 100 UNIT/ML IJ SOLN: 0.0000 [IU] | Freq: Three times a day (TID) | INTRAMUSCULAR | Status: DC

## 2021-08-31 MED ORDER — ONDANSETRON HCL 4 MG/2ML IJ SOLN
4.0000 mg | Freq: Four times a day (QID) | INTRAMUSCULAR | Status: DC | PRN
  Administered 2021-08-31: 4 mg via INTRAVENOUS
  Filled 2021-08-31: qty 2

## 2021-08-31 MED ORDER — IOHEXOL 300 MG/ML  SOLN
80.0000 mL | Freq: Once | INTRAMUSCULAR | Status: AC | PRN
  Administered 2021-08-31: 80 mL via INTRAVENOUS
  Filled 2021-08-31: qty 80

## 2021-08-31 MED ORDER — METRONIDAZOLE 500 MG/100ML IV SOLN
500.0000 mg | Freq: Once | INTRAVENOUS | Status: DC
  Filled 2021-08-31: qty 100

## 2021-08-31 MED ORDER — FENTANYL CITRATE (PF) 100 MCG/2ML IJ SOLN
INTRAMUSCULAR | Status: AC | PRN
  Administered 2021-08-31 (×2): 50 ug via INTRAVENOUS

## 2021-08-31 MED ORDER — OXYCODONE HCL 5 MG PO TABS
5.0000 mg | ORAL_TABLET | ORAL | Status: DC | PRN
  Administered 2021-08-31 – 2021-09-02 (×3): 5 mg via ORAL
  Filled 2021-08-31 (×3): qty 1

## 2021-08-31 MED ORDER — MORPHINE SULFATE (PF) 4 MG/ML IV SOLN
4.0000 mg | INTRAVENOUS | Status: DC | PRN
  Administered 2021-08-31 (×2): 4 mg via INTRAVENOUS
  Filled 2021-08-31 (×3): qty 1

## 2021-08-31 MED ORDER — ONDANSETRON 4 MG PO TBDP: 4.0000 mg | ORAL_TABLET | Freq: Four times a day (QID) | ORAL | Status: DC | PRN

## 2021-08-31 MED ORDER — PIPERACILLIN-TAZOBACTAM 3.375 G IVPB
3.3750 g | Freq: Three times a day (TID) | INTRAVENOUS | Status: DC
  Administered 2021-08-31 – 2021-09-02 (×7): 3.375 g via INTRAVENOUS
  Filled 2021-08-31 (×7): qty 50

## 2021-08-31 MED ORDER — SODIUM CHLORIDE 0.9 % IV BOLUS
500.0000 mL | Freq: Once | INTRAVENOUS | Status: AC
  Administered 2021-08-31: 500 mL via INTRAVENOUS

## 2021-08-31 MED ORDER — SODIUM CHLORIDE 0.9% FLUSH
5.0000 mL | Freq: Three times a day (TID) | INTRAVENOUS | Status: DC
  Administered 2021-08-31 – 2021-09-02 (×6): 5 mL

## 2021-08-31 MED ORDER — FENTANYL CITRATE (PF) 100 MCG/2ML IJ SOLN
INTRAMUSCULAR | Status: AC
  Filled 2021-08-31: qty 2

## 2021-08-31 MED ORDER — ACETAMINOPHEN 500 MG PO TABS
1000.0000 mg | ORAL_TABLET | Freq: Four times a day (QID) | ORAL | Status: DC
  Administered 2021-09-01 – 2021-09-02 (×5): 1000 mg via ORAL
  Filled 2021-08-31 (×6): qty 2

## 2021-08-31 MED ORDER — SODIUM CHLORIDE 0.9 % IV SOLN: Freq: Once | INTRAVENOUS | Status: AC

## 2021-08-31 MED ORDER — MIDAZOLAM HCL 2 MG/2ML IJ SOLN
INTRAMUSCULAR | Status: AC | PRN
  Administered 2021-08-31 (×2): 1 mg via INTRAVENOUS

## 2021-08-31 MED ORDER — ATORVASTATIN CALCIUM 20 MG PO TABS
40.0000 mg | ORAL_TABLET | Freq: Every evening | ORAL | Status: DC
  Administered 2021-09-01: 40 mg via ORAL
  Filled 2021-08-31: qty 2

## 2021-08-31 MED ORDER — ONDANSETRON HCL 4 MG/2ML IJ SOLN
4.0000 mg | Freq: Once | INTRAMUSCULAR | Status: AC
Start: 2021-08-31 — End: 2021-08-31
  Administered 2021-08-31: 4 mg via INTRAVENOUS
  Filled 2021-08-31: qty 2

## 2021-08-31 MED ORDER — CARVEDILOL 6.25 MG PO TABS
6.2500 mg | ORAL_TABLET | Freq: Two times a day (BID) | ORAL | Status: DC
  Administered 2021-08-31 – 2021-09-02 (×3): 6.25 mg via ORAL
  Filled 2021-08-31 (×3): qty 1

## 2021-08-31 MED ORDER — SODIUM CHLORIDE 0.9 % IV SOLN
1.0000 g | Freq: Once | INTRAVENOUS | Status: DC
  Administered 2021-08-31: 1 g via INTRAVENOUS
  Filled 2021-08-31: qty 10

## 2021-08-31 MED ORDER — MIDAZOLAM HCL 2 MG/2ML IJ SOLN
INTRAMUSCULAR | Status: AC
  Filled 2021-08-31: qty 2

## 2021-08-31 MED ORDER — APIXABAN 5 MG PO TABS: 5.0000 mg | ORAL_TABLET | Freq: Two times a day (BID) | ORAL | Status: DC

## 2021-08-31 NOTE — Sedation Documentation (Signed)
Quick report given to [place] RN in ED, plan to recover patient until 1330 in ED then return care over to her at that time ?

## 2021-08-31 NOTE — ED Notes (Signed)
Back from special procedures  a/o  states pain is about 3/10  abd dressing dry and intact   ?

## 2021-08-31 NOTE — ED Triage Notes (Signed)
Pt via POV from RLQ pain that has been intermittent for approx a month. Denies NVD. Denies fever. Denies urinary symptoms. Denies pain at this moment but states if she moves a certain way it gets worse. Denies any abd surgery except a hysterectomy. Pt is A&OX4 and NAD ?

## 2021-08-31 NOTE — Consult Note (Signed)
? ?Chief Complaint: ?Patient was seen in consultation today for periappendiceal fluid collection concerning for abscess at the request of Edison Simon, PA-C ? ?Referring Physician(s): ?Edison Simon, PA-C ? ?Supervising Physician: Daryll Brod ? ?Patient Status: [facility] - ED ? ?History of Present Illness: ?Dawn Bruce is a 76 y.o. female with PMHx significant for CKD, persistent afib on Eliquis last dose today, DM who presented to her family medicine provider on 4/10 with complaints of abdominal pain that is intermittent x 1 month and was scheduled to have an outpatient CT however pain progressively worsened and patient presented to ED 4/12. Imaging done revealed right lower quadrant fluid collection measuring up to 7.4 cm with surrounding inflammatory changes most compatible with periappendiceal abscess and labs with leukocytosis. Surgical team has seen the patient and request for IR consult. Patient is currently on IV Zosyn.  ? ?The patient denies any current chest pain or shortness of breath. She does complain of ongoing RLQ abdominal pain. The patient denies any fever or chills, nausea or vomiting. The patient denies any history of sleep apnea or chronic oxygen use. She has no known complications to sedation.  ? ? ?Past Medical History:  ?Diagnosis Date  ? Actinic keratosis   ? Basal cell carcinoma 05/02/2021  ? R upper back paraspinal - ED&C  ? Chronic pain   ? Hyperlipidemia   ? Hypertension   ? Squamous cell carcinoma of skin 09/10/2019  ? R dorsum hand and L wrist   ? Squamous cell carcinoma of skin 10/28/2019  ? R dorsum hand prox lateral   ? Squamous cell carcinoma of skin 10/28/2019  ? R dorsum hand prox medial   ? Squamous cell carcinoma of skin 10/28/2019  ? R dorsum hand distal lateral   ? Squamous cell carcinoma of skin 10/28/2019  ? R dorsum hand distal medial   ? Squamous cell carcinoma of skin 03/04/2020  ? SCC IS R medial infraorbital, LN2 04/28/20  ? Squamous cell carcinoma of skin 03/04/2020   ? R dorsal hand, EDC  ? Squamous cell carcinoma of skin 03/04/2020  ? SCC IS, L dorsum wrist, EDC  ? ? ?Past Surgical History:  ?Procedure Laterality Date  ? ABDOMINAL HYSTERECTOMY    ? AUGMENTATION MAMMAPLASTY Bilateral   ? 25-30 years ago  ? CARDIOVERSION N/A 12/01/2020  ? Procedure: CARDIOVERSION;  Surgeon: Kate Sable, MD;  Location: [facility] ORS;  Service: Cardiovascular;  Laterality: N/A;  ? CAROTID ARTERY ANGIOPLASTY Right 1995(approximate)  ? CAROTID ENDARTERECTOMY  2001  ? COSMETIC SURGERY    ? ELBOW SURGERY    ? THORACENTESIS N/A 12/27/2020  ? Procedure: THORACENTESIS;  Surgeon: Garner Nash, DO;  Location: [facility];  Service: Pulmonary;  Laterality: N/A;  ? TONSILLECTOMY    ? ? ?Allergies: ?Patient has no known allergies. ? ?Medications: ?Prior to Admission medications   ?Medication Sig Start Date End Date Taking? Authorizing Provider  ?apixaban (ELIQUIS) 5 MG TABS tablet Take 1 tablet (5 mg total) by mouth 2 (two) times daily. 02/14/21  Yes Kate Sable, MD  ?atorvastatin (LIPITOR) 40 MG tablet Take 1 tablet (40 mg total) by mouth every evening. ?Patient taking differently: Take 40 mg by mouth daily. 08/25/21  Yes Bacigalupo, Dionne Bucy, MD  ?carboxymethylcellulose (REFRESH PLUS) 0.5 % SOLN Place 1 drop into both eyes 3 (three) times daily as needed (dryness).   Yes [provider]  ?carvedilol (COREG) 6.25 MG tablet Take 6.25 mg by mouth 2 (two) times daily. 04/13/21  Yes [provider]  ?empagliflozin (JARDIANCE) 10 MG TABS tablet Take 1 tablet by mouth daily. 02/07/21 02/07/22 Yes [provider]  ?famotidine (PEPCID) 40 MG tablet Take 1 tablet (40 mg total) by mouth daily. 06/28/20  Yes Bacigalupo, Dionne Bucy, MD  ?fentaNYL (DURAGESIC) 50 MCG/HR Place 1 patch onto the skin every 3 (three) days. 06/30/21  Yes Mecum, Erin E, PA-C  ?pregabalin (LYRICA) 150 MG capsule TAKE 1 CAPSULE(150 MG) BY MOUTH TWICE DAILY ?Patient taking differently: Take 150 mg by mouth 2 (two) times  daily. 08/24/21  Yes Gwyneth Sprout, FNP  ?valsartan (DIOVAN) 160 MG tablet Take 160 mg by mouth in the morning and at bedtime. 08/30/21  Yes [provider]  ?  ? ?Family History  ?Problem Relation Age of Onset  ? Cancer Mother   ? Cancer Father   ? Breast cancer Neg Hx   ? ? ?Social History  ? ?Socioeconomic History  ? Marital status: Married  ?  Spouse name: Not on file  ? Number of children: 3  ? Years of education: Not on file  ? Highest education level: Some college, no degree  ?Occupational History  ? Occupation: retired  ?Tobacco Use  ? Smoking status: Former  ?  Packs/day: 0.50  ?  Years: 5.00  ?  Pack years: 2.50  ?  Types: Cigarettes  ?  Quit date: 20  ?  Years since quitting: 33.2  ? Smokeless tobacco: Never  ? Tobacco comments:  ?  quit around 1990-1995 (?)  ?Vaping Use  ? Vaping Use: Never used  ?Substance and Sexual Activity  ? Alcohol use: Yes  ?  Alcohol/week: 21.0 standard drinks  ?  Types: 21 Shots of liquor per week  ?  Comment: 3 scotch drinks a night  ? Drug use: Never  ? Sexual activity: Not on file  ?Other Topics Concern  ? Not on file  ?Social History Narrative  ? Not on file  ? ?Social Determinants of Health  ? ?Financial Resource Strain: Not on file  ?Food Insecurity: Not on file  ?Transportation Needs: Not on file  ?Physical Activity: Not on file  ?Stress: Not on file  ?Social Connections: Not on file  ? ?Review of Systems: A 12 point ROS discussed and pertinent positives are indicated in the HPI above.  All other systems are negative. ? ?Review of Systems ? ?Vital Signs: ?BP 130/88 (BP Location: Right Arm)   Pulse 70   Temp 99.9 ?F (37.7 ?C) (Oral)   Resp 18   Ht '5\' 3"'$  (1.6 m)   Wt 124 lb (56.2 kg)   SpO2 96%   BMI 21.97 kg/m?  ? ?Physical Exam ?Constitutional:   ?   Appearance: Normal appearance. She is well-developed.  ?HENT:  ?   Head: Normocephalic and atraumatic.  ?Cardiovascular:  ?   Rate and Rhythm: Normal rate. Rhythm irregular.  ?Pulmonary:  ?   Effort: Pulmonary  effort is normal. No respiratory distress.  ?   Breath sounds: Normal breath sounds.  ?Abdominal:  ?   Tenderness: There is abdominal tenderness.  ?Skin: ?   General: Skin is warm and dry.  ?Neurological:  ?   Mental Status: She is alert and oriented to person, place, and time.  ? ? ?Imaging: ?CT ABDOMEN PELVIS W CONTRAST ? ?Result Date: 08/31/2021 ?CLINICAL DATA:  76 year old female with right lower quadrant abdominal pain. EXAM: CT ABDOMEN AND PELVIS WITH CONTRAST TECHNIQUE: Multidetector CT imaging of the abdomen and pelvis was  performed using the standard protocol following bolus administration of intravenous contrast. RADIATION DOSE REDUCTION: This exam was performed according to the departmental dose-optimization program which includes automated exposure control, adjustment of the mA and/or kV according to patient size and/or use of iterative reconstruction technique. CONTRAST:  59m OMNIPAQUE IOHEXOL 300 MG/ML  SOLN COMPARISON:  None. FINDINGS: Lower chest: Moderate left and trace right pleural effusion. There is associated bibasilar subsegmental passive atelectasis. Hepatobiliary: No focal liver abnormality is seen. No gallstones, gallbladder wall thickening, or biliary dilatation. Pancreas: Unremarkable. No pancreatic ductal dilatation or surrounding inflammatory changes. Spleen: Normal in size without focal abnormality. Adrenals/Urinary Tract: Adrenal glands are unremarkable. Kidneys are normal, without renal calculi, focal lesion, or hydronephrosis. Bladder is unremarkable. Stomach/Bowel: Stomach is within normal limits. About the base of the cecum is a peripherally enhancing fluid collection with a few internal septations measuring approximately 7.4 x 5.2 x 6.0 cm (AP by trans by cc). Within the central and medial aspect of the fluid collection is an attenuating tubular structure resembling the appendix. Colonic diverticula without surrounding inflammatory changes. Vascular/Lymphatic: There are few mildly  prominent lymph nodes in the right lower quadrant mesentery. Atherosclerotic calcifications of the abdominal aorta. Reproductive: Status post hysterectomy. No adnexal masses. Other: Mild pericecal fat str

## 2021-08-31 NOTE — ED Provider Notes (Signed)
? ?[facility] ?Provider Note ? ? ? Event Date/Time  ? First MD Initiated Contact with Patient 08/31/21 [phone]   ?  (approximate) ? ? ?History  ? ?Abdominal Pain ? ? ?HPI ? ?Dawn Bruce is a 76 y.o. female who presents to the ER for evaluation of several weeks of progressively worsening right lower quadrant abdominal pain.  Has been seen at urgent care in [place] for this several weeks ago pain got better she returned here.  Has been referred for outpatient CT in a few weeks.  Pain and discomfort continues to worsen.  Denies any nausea or vomiting.  States the pain is moderate to severe at times.  She has had a hysterectomy but as far she knows still has her appendix. ?  ? ? ?Physical Exam  ? ?Triage Vital Signs: ?ED Triage Vitals  ?Enc Vitals Group  ?   BP 08/31/21 0719 (!) 129/92  ?   Pulse Rate 08/31/21 0719 71  ?   Resp 08/31/21 0719 20  ?   Temp 08/31/21 0716 99.9 ?F (37.7 ?C)  ?   Temp Source 08/31/21 0716 Oral  ?   SpO2 08/31/21 0719 95 %  ?   Weight 08/31/21 0717 124 lb (56.2 kg)  ?   Height 08/31/21 0717 '5\' 3"'$  (1.6 m)  ?   Head Circumference --   ?   Peak Flow --   ?   Pain Score 08/31/21 0717 0  ?   Pain Loc --   ?   Pain Edu? --   ?   Excl. in [place]? --   ? ? ?Most recent vital signs: ?Vitals:  ? 08/31/21 0716 08/31/21 0719  ?BP:  (!) 129/92  ?Pulse:  71  ?Resp:  20  ?Temp: 99.9 ?F (37.7 ?C)   ?SpO2:  95%  ? ? ? ?Constitutional: Alert  ?Eyes: Conjunctivae are normal.  ?Head: Atraumatic. ?Nose: No congestion/rhinnorhea. ?Mouth/Throat: Mucous membranes are moist.   ?Neck: Painless ROM.  ?Cardiovascular:   Good peripheral circulation. ?Respiratory: Normal respiratory effort.  No retractions.  ?Gastrointestinal: Soft and nontender.  ?Musculoskeletal:  no deformity ?Neurologic:  MAE spontaneously. No gross focal neurologic deficits are appreciated.  ?Skin:  Skin is warm, dry and intact. No rash noted. ?Psychiatric: Mood and affect are normal. Speech and behavior are normal. ? ? ? ?ED Results /  Procedures / Treatments  ? ?Labs ?(all labs ordered are listed, but only abnormal results are displayed) ?Labs Reviewed  ?CBC - Abnormal; Notable for the following components:  ?    Result Value  ? WBC 15.0 (*)   ? Hemoglobin 11.9 (*)   ? All other components within normal limits  ?COMPREHENSIVE METABOLIC PANEL - Abnormal; Notable for the following components:  ? Glucose, Bld 128 (*)   ? BUN 29 (*)   ? Creatinine, Ser 1.94 (*)   ? Calcium 8.5 (*)   ? Total Protein 6.4 (*)   ? Albumin 2.5 (*)   ? GFR, Estimated 26 (*)   ? All other components within normal limits  ?URINALYSIS, COMPLETE (UACMP) WITH MICROSCOPIC  ? ? ? ?EKG ? ? ? ? ?RADIOLOGY ?Please see ED Course for my review and interpretation. ? ?I personally reviewed all radiographic images ordered to evaluate for the above acute complaints and reviewed radiology reports and findings.  These findings were personally discussed with the patient.  Please see medical record for radiology report. ? ? ? ?PROCEDURES: ? ?Critical Care performed: No ? ?  Procedures ? ? ?MEDICATIONS ORDERED IN ED: ?Medications  ?morphine (PF) 4 MG/ML injection 4 mg (4 mg Intravenous Given 08/31/21 0816)  ?cefTRIAXone (ROCEPHIN) 1 g in sodium chloride 0.9 % 100 mL IVPB (has no administration in time range)  ?metroNIDAZOLE (FLAGYL) IVPB 500 mg (has no administration in time range)  ?0.9 %  sodium chloride infusion (has no administration in time range)  ?ondansetron [facility]) injection 4 mg (4 mg Intravenous Given 08/31/21 0816)  ?sodium chloride 0.9 % bolus 500 mL (500 mLs Intravenous New Bag/Given 08/31/21 0816)  ?iohexol (OMNIPAQUE) 300 MG/ML solution 80 mL (80 mLs Intravenous Contrast Given 08/31/21 0802)  ? ? ? ?IMPRESSION / MDM / ASSESSMENT AND PLAN / ED COURSE  ?I reviewed the triage vital signs and the nursing notes. ?             ?               ? ?Differential diagnosis includes, but is not limited to, diverticulitis, colitis, mass, SBO, stone, Pilo, appendicitis, musculoskeletal strain,  shingles ? ?Presenting to the ER for evaluation of right lower quadrant pain as described above.  She does have a low-grade temperature not septic appearing but is quite tender on exam.  Will order blood work will order IV fluids as well as IV morphine for pain will order CT imaging to evaluate for the but differential. ? ? ?Clinical Course as of 08/31/21 0846  ?Wed Aug 31, 2021  ?0822 CT imaging by my review and interpretation concerning for diverticulitis versus abscess/appendicitis.  Will await formal radiology report. [PR]  ?3007 Have consulted with general surgery agree with plan for admission based on the size of abscess may require IR drainage initially.  Patient is stable and appropriate for admission to the hospital. [PR]  ?  ?Clinical Course User Index ?[PR] Merlyn Lot, MD  ? ? ? ?FINAL CLINICAL IMPRESSION(S) / ED DIAGNOSES  ? ?Final diagnoses:  ?RLQ abdominal pain  ?Abscess, periappendiceal  ? ? ? ?Rx / DC Orders  ? ?ED Discharge Orders   ? ? None  ? ?  ? ? ? ?Note:  This document was prepared using Dragon voice recognition software and may include unintentional dictation errors. ? ?  ?Merlyn Lot, MD ?08/31/21 [phone] ? ?

## 2021-08-31 NOTE — ED Notes (Signed)
Taken to special procedures via stretcher ?

## 2021-08-31 NOTE — H&P (Addendum)
[facility] ?SURGICAL HISTORY & PHYSICAL (cpt [phone]) ? ?HISTORY OF PRESENT ILLNESS (HPI):  ?76 y.o. female presented to [facility] ED today for evaluation of abdominal pain. Patient reports that she has had right sided abdominal pain for about 1 month now. This had been waxing and waning in intensity over this time. However, in the last week, the pain became much more severe without significant relief. Pain is sharp in nature. No relief. She has been worked up with her PCP for this but was unable to get a CT until late in the month. No fever, chills, CP, SOB, nausea, emesis, bowel changes, or urinary changes. Only previous abdominal surgery is abdominal hysterectomy. She does have a history of atrial fibrillation and is on Eliquis; which she took this morning. Work up in the ED was concerning for leukocytosis to 15.0K, acute on chronic kidney disease with sCr - 1.94, no electrolyte derangements. CT Abdomen/Pelvis was concerning for significant fluid collection in the RLQ concerning for likely perforated appendicitis with large abscess.  ? ?General surgery is consulted by emergency medicine physician dr Merlyn Lot, MD for evaluation and management of likely perforated appendicitis with abscess. ? ? ?PAST MEDICAL HISTORY (PMH):  ?Past Medical History:  ?Diagnosis Date  ? Actinic keratosis   ? Basal cell carcinoma 05/02/2021  ? R upper back paraspinal - ED&C  ? Chronic pain   ? Hyperlipidemia   ? Hypertension   ? Squamous cell carcinoma of skin 09/10/2019  ? R dorsum hand and L wrist   ? Squamous cell carcinoma of skin 10/28/2019  ? R dorsum hand prox lateral   ? Squamous cell carcinoma of skin 10/28/2019  ? R dorsum hand prox medial   ? Squamous cell carcinoma of skin 10/28/2019  ? R dorsum hand distal lateral   ? Squamous cell carcinoma of skin 10/28/2019  ? R dorsum hand distal medial   ? Squamous cell carcinoma of skin 03/04/2020  ? SCC IS R medial infraorbital, LN2 04/28/20  ? Squamous cell  carcinoma of skin 03/04/2020  ? R dorsal hand, EDC  ? Squamous cell carcinoma of skin 03/04/2020  ? SCC IS, L dorsum wrist, EDC  ?  ?Reviewed. Otherwise negative.  ? ?PAST SURGICAL HISTORY ([place]):  ?Past Surgical History:  ?Procedure Laterality Date  ? ABDOMINAL HYSTERECTOMY    ? AUGMENTATION MAMMAPLASTY Bilateral   ? 25-30 years ago  ? CARDIOVERSION N/A 12/01/2020  ? Procedure: CARDIOVERSION;  Surgeon: Kate Sable, MD;  Location: [facility] ORS;  Service: Cardiovascular;  Laterality: N/A;  ? CAROTID ARTERY ANGIOPLASTY Right 1995(approximate)  ? CAROTID ENDARTERECTOMY  2001  ? COSMETIC SURGERY    ? ELBOW SURGERY    ? THORACENTESIS N/A 12/27/2020  ? Procedure: THORACENTESIS;  Surgeon: Garner Nash, DO;  Location: [facility];  Service: Pulmonary;  Laterality: N/A;  ? TONSILLECTOMY    ?  ?Reviewed. Otherwise negative.  ? ?MEDICATIONS:  ?Prior to Admission medications   ?Medication Sig Start Date End Date Taking? Authorizing Provider  ?apixaban (ELIQUIS) 5 MG TABS tablet Take 1 tablet (5 mg total) by mouth 2 (two) times daily. 02/14/21   Kate Sable, MD  ?atorvastatin (LIPITOR) 40 MG tablet Take 1 tablet (40 mg total) by mouth every evening. 08/25/21   Virginia Crews, MD  ?carvedilol (COREG) 6.25 MG tablet Take 6.25 mg by mouth 2 (two) times daily. 04/13/21   [provider]  ?empagliflozin (JARDIANCE) 10 MG TABS tablet Take 1 tablet by mouth daily. 02/07/21 02/07/22  [provider]  ?famotidine (PEPCID) 40 MG tablet Take 1 tablet (40 mg total) by mouth daily. 06/28/20   Virginia Crews, MD  ?fentaNYL (DURAGESIC) 50 MCG/HR Place 1 patch onto the skin every 3 (three) days. 06/30/21   Mecum, Erin E, PA-C  ?pregabalin (LYRICA) 150 MG capsule TAKE 1 CAPSULE(150 MG) BY MOUTH TWICE DAILY 08/24/21   Gwyneth Sprout, FNP  ?  ? ?ALLERGIES:  ?No Known Allergies  ? ?SOCIAL HISTORY:  ?Social History  ? ?Socioeconomic History  ? Marital status: Married  ?  Spouse name: Not on file  ? Number of children: 3   ? Years of education: Not on file  ? Highest education level: Some college, no degree  ?Occupational History  ? Occupation: retired  ?Tobacco Use  ? Smoking status: Former  ?  Packs/day: 0.50  ?  Years: 5.00  ?  Pack years: 2.50  ?  Types: Cigarettes  ?  Quit date: 3  ?  Years since quitting: 33.2  ? Smokeless tobacco: Never  ? Tobacco comments:  ?  quit around 1990-1995 (?)  ?Vaping Use  ? Vaping Use: Never used  ?Substance and Sexual Activity  ? Alcohol use: Yes  ?  Alcohol/week: 21.0 standard drinks  ?  Types: 21 Shots of liquor per week  ?  Comment: 3 scotch drinks a night  ? Drug use: Never  ? Sexual activity: Not on file  ?Other Topics Concern  ? Not on file  ?Social History Narrative  ? Not on file  ? ?Social Determinants of Health  ? ?Financial Resource Strain: Not on file  ?Food Insecurity: Not on file  ?Transportation Needs: Not on file  ?Physical Activity: Not on file  ?Stress: Not on file  ?Social Connections: Not on file  ?Intimate Partner Violence: Not on file  ?  ? ?FAMILY HISTORY:  ?Family History  ?Problem Relation Age of Onset  ? Cancer Mother   ? Cancer Father   ? Breast cancer Neg Hx   ?  ?Otherwise negative.  ? ?REVIEW OF SYSTEMS:  ?Review of Systems  ?Constitutional:  Negative for chills and fever.  ?HENT:  Negative for congestion and sore throat.   ?Respiratory:  Negative for cough and shortness of breath.   ?Cardiovascular:  Negative for chest pain and palpitations.  ?Gastrointestinal:  Positive for abdominal pain. Negative for diarrhea, nausea and vomiting.  ?Genitourinary:  Negative for dysuria and urgency.  ?All other systems reviewed and are negative. ? ?VITAL SIGNS:  ?Temp:  [99.9 ?F (37.7 ?C)] 99.9 ?F (37.7 ?C) (04/12 2637) ?Pulse Rate:  [71] 71 (04/12 0719) ?Resp:  [20] 20 (04/12 0719) ?BP: (129)/(92) 129/92 (04/12 0719) ?SpO2:  [95 %] 95 % (04/12 0719) ?Weight:  [56.2 kg] 56.2 kg (04/12 0717)     Height: '5\' 3"'$  (160 cm) Weight: 56.2 kg BMI (Calculated): 21.97  ? ?PHYSICAL EXAM:   ?Physical Exam ?Vitals and nursing note reviewed. Exam conducted with a chaperone present.  ?Constitutional:   ?   General: She is not in acute distress. ?   Appearance: She is well-developed and normal weight. She is not ill-appearing.  ?   Comments: Patient resting in bed, appears comfortable, husband at bedside   ?HENT:  ?   Head: Normocephalic and atraumatic.  ?Eyes:  ?   General: No scleral icterus. ?   Extraocular Movements: Extraocular movements intact.  ?Cardiovascular:  ?   Rate and Rhythm: Normal rate. Rhythm irregular.  ?  Heart sounds: Normal heart sounds. No murmur heard. ?Pulmonary:  ?   Effort: Pulmonary effort is normal. No respiratory distress.  ?   Breath sounds: Normal breath sounds.  ?Abdominal:  ?   General: Abdomen is flat. A surgical scar is present. There is no distension.  ?   Palpations: Abdomen is soft.  ?   Tenderness: There is abdominal tenderness in the right lower quadrant. There is no guarding or rebound. Positive signs include McBurney's sign. Negative signs include Rovsing's sign.  ?   Comments: Abdomen is soft, she is certainly very focally tender in the RLQ with positive McBurney's, non-distended, no rebound/guarding. I do not think she is overtly peritonitic or toxic at this time.   ?Genitourinary: ?   Comments: Deferred ?Skin: ?   General: Skin is warm and dry.  ?   Coloration: Skin is not jaundiced.  ?   Findings: No erythema.  ?Neurological:  ?   General: No focal deficit present.  ?   Mental Status: She is alert and oriented to person, place, and time.  ?Psychiatric:     ?   Mood and Affect: Mood normal.     ?   Behavior: Behavior normal.  ? ? ?INTAKE/OUTPUT:  ?This shift: No intake/output data recorded.  ?Last 2 shifts: '@IOLAST2SHIFTS'$ @ ? ?Labs:  ? ?  Latest Ref Rng & Units 08/31/2021  ?  7:42 AM 08/30/2021  ?  9:17 AM 11/19/2020  ? 11:54 AM  ?CBC  ?WBC 4.0 - 10.5 K/uL 15.0   15.6   6.5    ?Hemoglobin 12.0 - 15.0 g/dL 11.9   12.5   15.3    ?Hematocrit 36.0 - 46.0 % 37.0   37.9    46.8    ?Platelets 150 - 400 K/uL 214   222   183    ? ? ?  Latest Ref Rng & Units 08/31/2021  ?  7:42 AM 08/30/2021  ?  9:17 AM 03/29/2021  ? 12:36 PM  ?CMP  ?Glucose 70 - 99 mg/dL 128   111   99    ?BUN 8 - 2

## 2021-08-31 NOTE — ED Notes (Signed)
Pt oft for procedure. ?

## 2021-08-31 NOTE — ED Notes (Signed)
See triage note  presents with some abd pain   states pain started about 1 month ago  pain has been intermittent  low grade temp on arrival ?

## 2021-08-31 NOTE — Procedures (Signed)
Interventional Radiology Procedure Note ? ?Procedure: CT RLQ appy abscess drain   ? ?Complications: None ? ?Estimated Blood Loss:  min ? ?Findings: ?75cc purulent fld aspirated ?Cx sent   ? ?M. Daryll Brod, MD ? ? ? ?

## 2021-08-31 NOTE — ED Notes (Signed)
Report given to special procedures   ?

## 2021-08-31 NOTE — ED Notes (Signed)
Spoke with lab about culture with gram stain that was sent by IR to them. Lab staff say they are looking at it currently. ?

## 2021-08-31 NOTE — ED Notes (Signed)
Informed RN bed assigned 

## 2021-09-01 DIAGNOSIS — K3533 Acute appendicitis with perforation and localized peritonitis, with abscess: Secondary | ICD-10-CM | POA: Diagnosis not present

## 2021-09-01 LAB — COMPREHENSIVE METABOLIC PANEL
ALT: 13 U/L (ref 0–44)
AST: 22 U/L (ref 15–41)
Albumin: 2.2 g/dL — ABNORMAL LOW (ref 3.5–5.0)
Alkaline Phosphatase: 66 U/L (ref 38–126)
Anion gap: 8 (ref 5–15)
BUN: 28 mg/dL — ABNORMAL HIGH (ref 8–23)
CO2: 24 mmol/L (ref 22–32)
Calcium: 8.5 mg/dL — ABNORMAL LOW (ref 8.9–10.3)
Chloride: 105 mmol/L (ref 98–111)
Creatinine, Ser: 1.76 mg/dL — ABNORMAL HIGH (ref 0.44–1.00)
GFR, Estimated: 29 mL/min — ABNORMAL LOW (ref >60)
Glucose, Bld: 171 mg/dL — ABNORMAL HIGH (ref 70–99)
Potassium: 3.8 mmol/L (ref 3.5–5.1)
Sodium: 137 mmol/L (ref 135–145)
Total Bilirubin: 0.9 mg/dL (ref 0.3–1.2)
Total Protein: 5.6 g/dL — ABNORMAL LOW (ref 6.5–8.1)

## 2021-09-01 LAB — PHOSPHORUS: Phosphorus: 4.9 mg/dL — ABNORMAL HIGH (ref 2.5–4.6)

## 2021-09-01 LAB — CBC
HCT: 37.1 % (ref 36.0–46.0)
Hemoglobin: 11.6 g/dL — ABNORMAL LOW (ref 12.0–15.0)
MCH: 28 pg (ref 26.0–34.0)
MCHC: 31.3 g/dL (ref 30.0–36.0)
MCV: 89.6 fL (ref 80.0–100.0)
Platelets: 177 10*3/uL (ref 150–400)
RBC: 4.14 MIL/uL (ref 3.87–5.11)
RDW: 15.2 % (ref 11.5–15.5)
WBC: 13.6 10*3/uL — ABNORMAL HIGH (ref 4.0–10.5)
nRBC: 0 % (ref 0.0–0.2)

## 2021-09-01 LAB — GLUCOSE, CAPILLARY
Glucose-Capillary: 106 mg/dL — ABNORMAL HIGH (ref 70–99)
Glucose-Capillary: 117 mg/dL — ABNORMAL HIGH (ref 70–99)
Glucose-Capillary: 129 mg/dL — ABNORMAL HIGH (ref 70–99)
Glucose-Capillary: 145 mg/dL — ABNORMAL HIGH (ref 70–99)
Glucose-Capillary: 60 mg/dL — ABNORMAL LOW (ref 70–99)
Glucose-Capillary: 81 mg/dL (ref 70–99)
Glucose-Capillary: 89 mg/dL (ref 70–99)

## 2021-09-01 LAB — MAGNESIUM: Magnesium: 1.9 mg/dL (ref 1.7–2.4)

## 2021-09-01 MED ORDER — DEXTROSE 50 % IV SOLN
INTRAVENOUS | Status: AC
Start: 2021-09-01 — End: 2021-09-01
  Filled 2021-09-01: qty 50

## 2021-09-01 MED ORDER — APIXABAN 5 MG PO TABS
5.0000 mg | ORAL_TABLET | Freq: Two times a day (BID) | ORAL | Status: DC
  Administered 2021-09-02: 5 mg via ORAL
  Filled 2021-09-01: qty 1

## 2021-09-01 MED ORDER — DEXTROSE 50 % IV SOLN
12.5000 g | Freq: Once | INTRAVENOUS | Status: AC
  Administered 2021-09-01: 12.5 g via INTRAVENOUS

## 2021-09-01 MED ORDER — DEXTROSE 50 % IV SOLN: 50.0000 mL | Freq: Once | INTRAVENOUS | Status: DC

## 2021-09-01 NOTE — Progress Notes (Signed)
Referring Physician(s): Lynden Oxford, PA  Supervising Physician: Mir, Mauri Reading  Patient Status:  [facility] - In-pt  Chief Complaint:  Status post CT-guided right lower quadrant appendiceal abscess drain placement  Subjective:  Patient up walking with PT.  Patient reports she is feeling much better.  She complains of mild tenderness to insertion site.  States she is eating and drinking normally.  Allergies: Patient has no known allergies.  Medications: Prior to Admission medications   Medication Sig Start Date End Date Taking? Authorizing Provider  apixaban (ELIQUIS) 5 MG TABS tablet Take 1 tablet (5 mg total) by mouth 2 (two) times daily. 02/14/21  Yes Agbor-Etang, Arlys John, MD  atorvastatin (LIPITOR) 40 MG tablet Take 1 tablet (40 mg total) by mouth every evening. Patient taking differently: Take 40 mg by mouth daily. 08/25/21  Yes Bacigalupo, Marzella Schlein, MD  carboxymethylcellulose (REFRESH PLUS) 0.5 % SOLN Place 1 drop into both eyes 3 (three) times daily as needed (dryness).   Yes [provider]  carvedilol (COREG) 6.25 MG tablet Take 6.25 mg by mouth 2 (two) times daily. 04/13/21  Yes [provider]  empagliflozin (JARDIANCE) 10 MG TABS tablet Take 1 tablet by mouth daily. 02/07/21 02/07/22 Yes [provider]  famotidine (PEPCID) 40 MG tablet Take 1 tablet (40 mg total) by mouth daily. 06/28/20  Yes Bacigalupo, Marzella Schlein, MD  fentaNYL (DURAGESIC) 50 MCG/HR Place 1 patch onto the skin every 3 (three) days. 06/30/21  Yes Mecum, Erin E, PA-C  pregabalin (LYRICA) 150 MG capsule TAKE 1 CAPSULE(150 MG) BY MOUTH TWICE DAILY Patient taking differently: Take 150 mg by mouth 2 (two) times daily. 08/24/21  Yes Jacky Kindle, FNP  valsartan (DIOVAN) 160 MG tablet Take 160 mg by mouth in the morning and at bedtime. 08/30/21  Yes [provider]     Vital Signs: BP (!) 119/48 (BP Location: Right Arm)   Pulse (!) 52   Temp 97.8 F (36.6 C)   Resp 16   Ht 5\' 3"   (1.6 m)   Wt 124 lb (56.2 kg)   SpO2 100%   BMI 21.97 kg/m   Physical Exam Vitals reviewed.  Constitutional:      General: She is not in acute distress.    Appearance: Normal appearance. She is not ill-appearing.  HENT:     Head: Normocephalic and atraumatic.  Eyes:     Extraocular Movements: Extraocular movements intact.     Pupils: Pupils are equal, round, and reactive to light.  Pulmonary:     Effort: Pulmonary effort is normal. No respiratory distress.  Abdominal:     Comments: Low right lower quadrant drain intact with sutures and StatLock in place.  Scant amount of bloody output in JP.  115 cc documented output over the past 24 hours.  Drain flushes and aspirates easily.  Small amount of dried blood around insertion site.  No oozing, edema or active bleeding noted.  Dressing clean dry and intact.  Skin:    General: Skin is warm and dry.  Neurological:     Mental Status: She is alert and oriented to person, place, and time.  Psychiatric:        Mood and Affect: Mood normal.        Behavior: Behavior normal.        Thought Content: Thought content normal.        Judgment: Judgment normal.    Imaging: CT ABDOMEN PELVIS W CONTRAST  Result Date: 08/31/2021 CLINICAL DATA:  76 year old female with right lower quadrant abdominal pain. EXAM: CT ABDOMEN AND PELVIS WITH CONTRAST TECHNIQUE: Multidetector CT imaging of the abdomen and pelvis was performed using the standard protocol following bolus administration of intravenous contrast. RADIATION DOSE REDUCTION: This exam was performed according to the departmental dose-optimization program which includes automated exposure control, adjustment of the mA and/or kV according to patient size and/or use of iterative reconstruction technique. CONTRAST:  80mL OMNIPAQUE IOHEXOL 300 MG/ML  SOLN COMPARISON:  None. FINDINGS: Lower chest: Moderate left and trace right pleural effusion. There is associated bibasilar subsegmental passive atelectasis.  Hepatobiliary: No focal liver abnormality is seen. No gallstones, gallbladder wall thickening, or biliary dilatation. Pancreas: Unremarkable. No pancreatic ductal dilatation or surrounding inflammatory changes. Spleen: Normal in size without focal abnormality. Adrenals/Urinary Tract: Adrenal glands are unremarkable. Kidneys are normal, without renal calculi, focal lesion, or hydronephrosis. Bladder is unremarkable. Stomach/Bowel: Stomach is within normal limits. About the base of the cecum is a peripherally enhancing fluid collection with a few internal septations measuring approximately 7.4 x 5.2 x 6.0 cm (AP by trans by cc). Within the central and medial aspect of the fluid collection is an attenuating tubular structure resembling the appendix. Colonic diverticula without surrounding inflammatory changes. Vascular/Lymphatic: There are few mildly prominent lymph nodes in the right lower quadrant mesentery. Atherosclerotic calcifications of the abdominal aorta. Reproductive: Status post hysterectomy. No adnexal masses. Other: Mild pericecal fat stranding no abdominal wall hernia. Musculoskeletal: Moderate multilevel degenerative changes of the visualized thoracolumbar spine, most pronounced at L3-L4. IMPRESSION: 1. Right lower quadrant fluid collection measuring up to 7.4 cm with surrounding inflammatory changes most compatible with periappendiceal abscess. 2. Moderate left and trace right pleural effusions with associated basal atelectasis. 3. Colonic diverticulosis. 4. Multilevel degenerative changes of the thoracolumbar spine. Marliss Coots, MD [facility] Electronically Signed   By: Marliss Coots M.D.   On: 08/31/2021 08:29   CT IMAGE GUIDED DRAINAGE BY PERCUTANEOUS CATHETER  Result Date: 08/31/2021 INDICATION: Right lower quadrant periappendiceal abscess EXAM: CT-GUIDED 12 FRENCH DRAINAGE OF THE RIGHT LOWER QUADRANT ABSCESS MEDICATIONS: The patient  is currently admitted to the hospital and receiving intravenous antibiotics. The antibiotics were administered within an appropriate time frame prior to the initiation of the procedure. ANESTHESIA/SEDATION: Moderate (conscious) sedation was employed during this procedure. A total of Versed 2.0 mg and Fentanyl 100 mcg was administered intravenously by the radiology nurse. Total intra-service moderate Sedation Time: 20 minutes. The patient's level of consciousness and vital signs were monitored continuously by radiology nursing throughout the procedure under my direct supervision. COMPLICATIONS: None immediate. PROCEDURE: Informed written consent was obtained from the patient after a thorough discussion of the procedural risks, benefits and alternatives. All questions were addressed. Maximal Sterile Barrier Technique was utilized including caps, mask, sterile gowns, sterile gloves, sterile drape, hand hygiene and skin antiseptic. A timeout was performed prior to the initiation of the procedure. previous imaging reviewed. patient positioned supine. noncontrast localization ct performed. the right lower quadrant appendiceal abscess was localized and marked for a lateral approach. under sterile conditions and local anesthesia, an 18 gauge 10 cm access needle was advanced from a lateral approach into the fluid collection. needle position confirmed with ct. syringe aspiration yielded purulent fecal contaminated fluid. sample sent for culture. guidewire inserted followed by tract dilatation to insert a 12 french drain. drain catheter position confirmed with ct. syringe aspiration yielded a total of 75 cc purulent fluid. catheter secured with a silk suture and connected  to external suction bulb. sterile dressing applied. no immediate complication. patient tolerated the procedure well. IMPRESSION: Successful CT-guided 12 French right lower quadrant appendiceal abscess drain placement. Electronically Signed   By: Judie Petit.  Shick  M.D.   On: 08/31/2021 13:44    Labs:  CBC: Recent Labs    11/19/20 1154 08/30/21 0917 08/31/21 0742 09/01/21 0455  WBC 6.5 15.6* 15.0* 13.6*  HGB 15.3* 12.5 11.9* 11.6*  HCT 46.8* 37.9 37.0 37.1  PLT 183 222 214 177    COAGS: No results for input(s): INR, APTT in the last 8760 hours.  BMP: Recent Labs    03/14/21 1120 03/29/21 1236 08/30/21 0917 08/31/21 0742 09/01/21 0455  NA 141 142 141 136 137  K 4.6 4.3 4.5 4.0 3.8  CL 105 106 103 103 105  CO2 29 29 25 28 24   GLUCOSE 104* 99 111* 128* 171*  BUN 34* 24* 25 29* 28*  CALCIUM 9.1 9.0 8.7 8.5* 8.5*  CREATININE 1.81* 1.55* 1.83* 1.94* 1.76*  GFRNONAA 28* 34*  --  26* 29*    LIVER FUNCTION TESTS: Recent Labs    08/30/21 0917 08/31/21 0742 09/01/21 0455  BILITOT 0.5 0.7 0.9  AST 26 23 22   ALT 15 14 13   ALKPHOS 100 76 66  PROT 5.7* 6.4* 5.6*  ALBUMIN 3.0* 2.5* 2.2*    Assessment and Plan:  Status post CT-guided right lower quadrant appendiceal abscess drain placement  Patient up walking with PT.  Patient reports she is feeling much better.  She complains of mild tenderness to insertion site.  States she is eating and drinking normally.  Right lower quadrant drain intact with sutures and StatLock in place.  Scant amount of bloody output in JP.  115 cc documented output over the past 24 hours.  Drain flushes and aspirates easily.  Small amount of dried blood around insertion site.  No oozing, edema or active bleeding noted.  Dressing clean dry and intact.  WBC trending down 13.6 (15.0) Afebrile, VSS.  Continue to flush drain with 5 cc NS TID. Record OP q shift.  Change dressing q shift or as needed to keep clean and dry.  Contact IR if difficulty flushing or if there is a sudden change in OP.  IR following- call with questions or concerns.    Electronically Signed: Shon Hough, NP 09/01/2021, 3:30 PM   I spent a total of 15 Minutes at the the patient's bedside AND on the patient's hospital floor  or unit, greater than 50% of which was counseling/coordinating care for Status post CT-guided right lower quadrant appendiceal abscess drain placement 08/31/21.

## 2021-09-01 NOTE — Plan of Care (Signed)

## 2021-09-01 NOTE — Care Management Important Message (Signed)
Important Message ? ?Patient Details  ?Name: Dawn Bruce ?MRN: [id number] ?Date of Birth: 1945/09/14 ? ? ?Medicare Important Message Given:  N/A - LOS <3 / Initial given by admissions ? ? ? ? ?[name] ?09/01/2021, 6:26 PM ?

## 2021-09-01 NOTE — TOC Initial Note (Signed)
Transition of Care (TOC) - Initial/Assessment Note  ? ? ?Patient Details  ?Name: Dawn Bruce ?MRN: [id number] ?Date of Birth: Oct 06, 1945 ? ?Transition of Care (TOC) CM/SW Contact:    ?Laurena Slimmer, RN ?Phone Number: ?09/01/2021, 3:15 PM ? ?Clinical Narrative:                 ? ?Transition of Care (TOC) Screening Note ? ? ?Patient Details  ?Name: Dawn Bruce ?Date of Birth: 1945/08/04 ? ? ?Transition of Care (TOC) CM/SW Contact:    ?Laurena Slimmer, RN ?Phone Number: ?09/01/2021, 3:15 PM ? ? ? ?Transition of Care Department [facility]) has reviewed patient and no TOC needs have been identified at this time. We will continue to monitor patient advancement through interdisciplinary progression rounds. If new patient transition needs arise, please place a TOC consult. ?  ? ?  ?  ? ? ?Patient Goals and CMS Choice ?  ?  ?  ? ?Expected Discharge Plan and Services ?  ?  ?  ?  ?  ?                ?  ?  ?  ?  ?  ?  ?  ?  ?  ?  ? ?Prior Living Arrangements/Services ?  ?  ?  ?       ?  ?  ?  ?  ? ?Activities of Daily Living ?Home Assistive Devices/Equipment: None ?ADL Screening (condition at time of admission) ?Patient's cognitive ability adequate to safely complete daily activities?: Yes ?Is the patient deaf or have difficulty hearing?: No ?Does the patient have difficulty seeing, even when wearing glasses/contacts?: No ?Does the patient have difficulty concentrating, remembering, or making decisions?: No ?Patient able to express need for assistance with ADLs?: Yes ?Does the patient have difficulty dressing or bathing?: No ?Independently performs ADLs?: Yes (appropriate for developmental age) ?Does the patient have difficulty walking or climbing stairs?: No ?Weakness of Legs: None ?Weakness of Arms/Hands: None ? ?Permission Sought/Granted ?  ?  ?   ?   ?   ?   ? ?Emotional Assessment ?  ?  ?  ?  ?  ?  ? ?Admission diagnosis:  Abscess, periappendiceal [K35.33] ?RLQ abdominal pain [R10.31] ?Perforated appendicitis [K35.32] ?Patient  Active Problem List  ? Diagnosis Date Noted  ? Perforated appendicitis 08/31/2021  ? LUQ abdominal pain 07/04/2021  ? Senile purpura ([place]) 12/03/2020  ? Persistent atrial fibrillation ([place])   ? Prediabetes 10/08/2019  ? Snoring 10/08/2019  ? Stenosis of right carotid artery 10/03/2018  ? Hyperlipidemia 10/03/2018  ? GERD (gastroesophageal reflux disease) 10/03/2018  ? GAD (generalized anxiety disorder) 10/03/2018  ? Essential hypertension 10/02/2018  ? CKD (chronic kidney disease) stage 3, GFR 30-59 ml/min (HCC) 06/17/2018  ? Chronic forearm pain (Primary Area of Pain) (Left) 06/17/2018  ? Neurogenic pain 06/17/2018  ? Chronic pain syndrome 06/06/2018  ? Long term current use of opiate analgesic 06/06/2018  ? ?PCP:  Virginia Crews, MD ?Pharmacy:   ?[facility] [address] ?[place] ?[place] [address] ?Phone: [phone] Fax: [phone] ? ? ? ? ?Social Determinants of Health (SDOH) Interventions ?  ? ?Readmission Risk Interventions ?   ? View : No data to display.  ?  ?  ?  ? ? ? ?

## 2021-09-01 NOTE — Progress Notes (Signed)
[facility] ?SURGICAL PROGRESS NOTE (cpt [phone]) ? ?Hospital Day(s): 1.  ? ?Interval History: Patient seen and examined, no acute events or new complaints overnight. Patient reports she is overall feeling much better compared to presentation. She denies fever, chills, nausea, emesis. Leukocytosis improving; down to 13.6K. Renal function improved; still likely her baseline; sCr - 1.76; UO - unmeasured. Hyperphosphatemia to 4.9; likely secondary to renal disease otherwise no electrolyte derangements. Drain with 115 ccs out; sanguinous today. Cx still too young to read. She is on Zosyn. Started on CLD.  ? ?Review of Systems:  ?Constitutional: denies fever, chills  ?HEENT: denies cough or congestion  ?Respiratory: denies any shortness of breath  ?Cardiovascular: denies chest pain or palpitations  ?Gastrointestinal: + abdominal pain (improved), denied N/V, or diarrhea ?Genitourinary: denies burning with urination or urinary frequency ?Musculoskeletal: denies pain, decreased motor or sensation ? ?Vital signs in last 24 hours: [min-max] current  ?Temp:  [97.8 ?F (36.6 ?C)-99 ?F (37.2 ?C)] 97.8 ?F (36.6 ?C) (04/13 9371) ?Pulse Rate:  [55-72] 56 (04/13 0432) ?Resp:  [11-20] 16 (04/13 0432) ?BP: (104-154)/(44-97) 132/58 (04/13 0432) ?SpO2:  [93 %-100 %] 98 % (04/13 0432)     Height: '5\' 3"'$  (160 cm) Weight: 56.2 kg BMI (Calculated): 21.97  ? ?Intake/Output last 2 shifts:  ?04/12 0701 - 04/13 0700 ?In: 91.7 [IV Piggyback:91.7] ?Out: 115 [Drains:115]  ? ?Physical Exam:  ?Constitutional: alert, cooperative and no distress  ?HENT: normocephalic without obvious abnormality  ?Eyes: PERRL, EOM's grossly intact and symmetric  ?Respiratory: breathing non-labored at rest  ?Cardiovascular: regular rate and sinus rhythm  ?Gastrointestinal: soft, RLQ tenderness, and non-distended. No rebound/guarding. Drain in the RLQ; output sanguinous this morning but low volume  ?Musculoskeletal: no edema or wounds, motor and sensation  grossly intact, NT  ? ? ?Labs:  ? ?  Latest Ref Rng & Units 09/01/2021  ?  4:55 AM 08/31/2021  ?  7:42 AM 08/30/2021  ?  9:17 AM  ?CBC  ?WBC 4.0 - 10.5 K/uL 13.6   15.0   15.6    ?Hemoglobin 12.0 - 15.0 g/dL 11.6   11.9   12.5    ?Hematocrit 36.0 - 46.0 % 37.1   37.0   37.9    ?Platelets 150 - 400 K/uL 177   214   222    ? ? ?  Latest Ref Rng & Units 09/01/2021  ?  4:55 AM 08/31/2021  ?  7:42 AM 08/30/2021  ?  9:17 AM  ?CMP  ?Glucose 70 - 99 mg/dL 171   128   111    ?BUN 8 - 23 mg/dL '28   29   25    '$ ?Creatinine 0.44 - 1.00 mg/dL 1.76   1.94   1.83    ?Sodium 135 - 145 mmol/L 137   136   141    ?Potassium 3.5 - 5.1 mmol/L 3.8   4.0   4.5    ?Chloride 98 - 111 mmol/L 105   103   103    ?CO2 22 - 32 mmol/L '24   28   25    '$ ?Calcium 8.9 - 10.3 mg/dL 8.5   8.5   8.7    ?Total Protein 6.5 - 8.1 g/dL 5.6   6.4   5.7    ?Total Bilirubin 0.3 - 1.2 mg/dL 0.9   0.7   0.5    ?Alkaline Phos 38 - 126 U/L 66   76   100    ?AST 15 -  41 U/L '22   23   26    '$ ?ALT 0 - 44 U/L '13   14   15    '$ ? ? ? ?Imaging studies: No new pertinent imaging studies ? ? ?Assessment/Plan: (ICD-10's: K35.32) ?76 y.o. female with improving leukocytosis and RLQ abdominal pain found to have significant fluid collection in the RLQ likely secondary to perforated appendicitis with abscess s/p percutaneous drainage on 44/96, complicated by pertinent comorbidities including PAF on Eliquis, CKD, DM.  ?  ?           - CLD this morning; may be able to advance to full liquids tonight ? - Continue drain; monitor and record output ?           - No emergent surgical intervention. Will attempt conservative management. She understands that should she fail to improve or clinically deteriorate, she would need more urgent intervention and likely right colectomy. Will likely benefit from interval appendectomy in ~8 weeks pending condition.  ?           - Continue IV Abx (Zosyn); Day 2 ?- Monitor abdominal examination; on-going bowel function  ?           - Pain control prn;  antiemetics prn ?           - Monitor leukocytosis; improved ?           - Mobilization as tolerated ?           - DVT prophylaxis; will restart Eliquis tomorrow given sanguinous output from drain. Again, the output is low, suspect she may have small amount of oozing in this area. No concern for gross bleed.  ?  ?All of the above findings and recommendations were discussed with the patient and her husband at bedside, and all of their questions were answered to their expressed satisfaction. ? ?-- ?Edison Simon, PA-C ?[facility] ?09/01/2021, 7:29 AM ?M-F: 7am - 4pm ? ?

## 2021-09-01 NOTE — Progress Notes (Signed)
Patient glucose 60 this am, hypoglycemia protocol followed for NPO patient 12.5g dextrose given via IV. Will re-check glucose in 15 minutes. Patient remained alert and oriented she had concern that she was sweating on the back of her head and had gotten her pillow case wet. Nurse educated patient on vitals being stable and that she is receiving IV antibiotics and that nurse will continue to monitor her for any changes that should be of concern, patient verbalized understanding.  ?

## 2021-09-01 NOTE — Progress Notes (Signed)
Mobility Specialist - Progress Note ? ? 09/01/21 1100  ?Mobility  ?Activity Ambulated independently in hallway;Ambulated with assistance in hallway  ?Level of Assistance Independent  ?Assistive Device None  ?Distance Ambulated (ft) 210 ft  ?Activity Response Tolerated well  ?$Mobility charge 1 Mobility  ? ? ? ?Pt was lying in bed upon arrival, utilizing RA. Pt denied pain throughout session. Ambulated in hallway with HHA progressing to no physical assist. No LOB, SOB, or dizziness. Pt ambulated to bathroom prior to return to bed. Needs in reach, spouse at bedside.  ? ? ?Dawn Bruce ?Mobility Specialist ?09/01/21, 12:21 PM ?  ? ? ? ?

## 2021-09-02 ENCOUNTER — Encounter: Payer: Self-pay | Admitting: Family Medicine

## 2021-09-02 LAB — BASIC METABOLIC PANEL
Anion gap: 7 (ref 5–15)
BUN: 27 mg/dL — ABNORMAL HIGH (ref 8–23)
CO2: 26 mmol/L (ref 22–32)
Calcium: 8.5 mg/dL — ABNORMAL LOW (ref 8.9–10.3)
Chloride: 109 mmol/L (ref 98–111)
Creatinine, Ser: 1.61 mg/dL — ABNORMAL HIGH (ref 0.44–1.00)
GFR, Estimated: 33 mL/min — ABNORMAL LOW (ref >60)
Glucose, Bld: 105 mg/dL — ABNORMAL HIGH (ref 70–99)
Potassium: 3.7 mmol/L (ref 3.5–5.1)
Sodium: 142 mmol/L (ref 135–145)

## 2021-09-02 LAB — CBC
HCT: 37.3 % (ref 36.0–46.0)
Hemoglobin: 11.9 g/dL — ABNORMAL LOW (ref 12.0–15.0)
MCH: 27.7 pg (ref 26.0–34.0)
MCHC: 31.9 g/dL (ref 30.0–36.0)
MCV: 86.7 fL (ref 80.0–100.0)
Platelets: 209 10*3/uL (ref 150–400)
RBC: 4.3 MIL/uL (ref 3.87–5.11)
RDW: 15.2 % (ref 11.5–15.5)
WBC: 11.2 10*3/uL — ABNORMAL HIGH (ref 4.0–10.5)
nRBC: 0 % (ref 0.0–0.2)

## 2021-09-02 LAB — GLUCOSE, CAPILLARY: Glucose-Capillary: 92 mg/dL (ref 70–99)

## 2021-09-02 MED ORDER — OXYCODONE HCL 5 MG PO TABS: 5.0000 mg | ORAL_TABLET | Freq: Four times a day (QID) | ORAL | 0 refills | Status: DC | PRN

## 2021-09-02 MED ORDER — AMOXICILLIN-POT CLAVULANATE 875-125 MG PO TABS: 1.0000 | ORAL_TABLET | Freq: Two times a day (BID) | ORAL | 0 refills | Status: AC

## 2021-09-02 NOTE — Plan of Care (Signed)

## 2021-09-02 NOTE — Progress Notes (Signed)
Patient medically cleared to discharge with order placed by MD.  Patient received AVS prior to discharge including education provided by Charge RN regarding follow up appointments and changes to medications with all questions and concerns addressed by Charge RN. PIV removed and patient is awaiting arrival of spouse for d/c to home.   ?

## 2021-09-02 NOTE — Discharge Summary (Signed)
[facility] ?SURGICAL DISCHARGE SUMMARY (cpt: [phone]) ? ?Patient ID: ?Dawn Bruce ?MRN: [id number] ?DOB/AGE: 78-27-1947 78 y.o. ? ?Admit date: 08/31/2021 ?Discharge date: 09/02/2021 ? ?Discharge Diagnoses ?Patient Active Problem List  ? Diagnosis Date Noted  ? Perforated appendicitis 08/31/2021  ? ? ?Consultants ?Interventional Radiology  ? ?Procedures ?08/31/2021:  ?Percutaneous Drain placement  ? ?HPI: 76 y.o. female presented to [facility] ED today for evaluation of abdominal pain. Patient reports that she has had right sided abdominal pain for about 1 month now. This had been waxing and waning in intensity over this time. However, in the last week, the pain became much more severe without significant relief. Pain is sharp in nature. No relief. She has been worked up with her PCP for this but was unable to get a CT until late in the month. No fever, chills, CP, SOB, nausea, emesis, bowel changes, or urinary changes. Only previous abdominal surgery is abdominal hysterectomy. She does have a history of atrial fibrillation and is on Eliquis; which she took this morning. Work up in the ED was concerning for leukocytosis to 15.0K, acute on chronic kidney disease with sCr - 1.94, no electrolyte derangements. CT Abdomen/Pelvis was concerning for significant fluid collection in the RLQ concerning for likely perforated appendicitis with large abscess.  ? ?Hospital Course: Patient was admitted to general surgery service. Interventional radiology was consulted and she underwent percutaneous drainage on 04/12. She otherwise did well. Advancement of patient's diet and ambulation were well-tolerated. The remainder of patient's hospital course was essentially unremarkable, and discharge planning was initiated accordingly with patient safely able to be discharged home with appropriate discharge instructions, antibiotics (Augmentin x12 days to complete 14 days total in setting of perforation), pain control, and outpatient  follow-up after all of her questions were answered to her expressed satisfaction.  ? ?Discharge Condition: Good ? ? ?Physical Examination:  ?Constitutional: alert, cooperative and no distress  ?HENT: normocephalic without obvious abnormality  ?Eyes: PERRL, EOM's grossly intact and symmetric  ?Respiratory: breathing non-labored at rest  ?Cardiovascular: regular rate and sinus rhythm  ?Gastrointestinal: soft, RLQ soreness improved, and non-distended. No rebound/guarding. Drain in the RLQ; output sanguinous this morning but low volume  ?Musculoskeletal: no edema or wounds, motor and sensation grossly intact, NT  ? ? ? ?Allergies as of 09/02/2021   ?No Known Allergies ?  ? ?  ?Medication List  ?  ? ?TAKE these medications   ? ?amoxicillin-clavulanate 875-125 MG tablet ?Commonly known as: Augmentin ?Take 1 tablet by mouth 2 (two) times daily for 12 days. ?  ?apixaban 5 MG Tabs tablet ?Commonly known as: Eliquis ?Take 1 tablet (5 mg total) by mouth 2 (two) times daily. ?  ?atorvastatin 40 MG tablet ?Commonly known as: LIPITOR ?Take 1 tablet (40 mg total) by mouth every evening. ?What changed: when to take this ?  ?carboxymethylcellulose 0.5 % Soln ?Commonly known as: REFRESH PLUS ?Place 1 drop into both eyes 3 (three) times daily as needed (dryness). ?  ?carvedilol 6.25 MG tablet ?Commonly known as: COREG ?Take 6.25 mg by mouth 2 (two) times daily. ?  ?empagliflozin 10 MG Tabs tablet ?Commonly known as: JARDIANCE ?Take 1 tablet by mouth daily. ?  ?famotidine 40 MG tablet ?Commonly known as: PEPCID ?Take 1 tablet (40 mg total) by mouth daily. ?  ?fentaNYL 50 MCG/HR ?Commonly known as: [place] ?Place 1 patch onto the skin every 3 (three) days. ?  ?oxyCODONE 5 MG immediate release tablet ?Commonly known as: Oxy IR/ROXICODONE ?Take 1 tablet (  5 mg total) by mouth every 6 (six) hours as needed for severe pain or breakthrough pain. ?  ?pregabalin 150 MG capsule ?Commonly known as: LYRICA ?TAKE 1 CAPSULE(150 MG) BY MOUTH TWICE  DAILY ?What changed: See the new instructions. ?  ?valsartan 160 MG tablet ?Commonly known as: DIOVAN ?Take 160 mg by mouth in the morning and at bedtime. ?  ? ?  ? ? ? ? Follow-up Information   ? ? Ronny Bacon, MD. Schedule an appointment as soon as possible for a visit in 1 week(s).   ?Specialty: General Surgery ?Why: Hospital follow up; perforated appendicitis with abscess; has drain ?Contact information: ?[address] ?[address] ?[place] [address] ?[phone] ? ? ?  ?  ? ?  ?  ? ?  ? ? ? ?Time spent on discharge management including discussion of hospital course, clinical condition, outpatient instructions, prescriptions, and follow up with the patient and members of the medical team: >30 minutes ? ?-- ?Edison Simon , PA-C ?[facility]  ?09/02/2021, 10:14 AM ?[phone] ?M-F: 7am - 4pm ? ?

## 2021-09-02 NOTE — Discharge Instructions (Signed)
In addition to included general post-operative instructions, ? ?Diet: Resume home diet.  ? ?Activity: Do not drive or drink alcohol if taking narcotic pain medications or having pain that might distract from driving. ? ?Wound care: If you can keep drain site water proofed, you may shower/get incision wet with soapy water and pat dry (do not rub incisions), but no baths or submerging incision underwater until follow-up.  ? ?Medications: Resume all home medications. For mild to moderate pain: acetaminophen (Tylenol) or ibuprofen/naproxen (if no kidney disease). Combining Tylenol with alcohol can substantially increase your risk of causing liver disease. Narcotic pain medications, if prescribed, can be used for severe pain, though may cause nausea, constipation, and drowsiness. Do not combine Tylenol and Percocet (or similar) within a 6 hour period as Percocet (and similar) contain(s) Tylenol. If you do not need the narcotic pain medication, you do not need to fill the prescription. ? ?Call office [phone] / [phone]) at any time if any questions, worsening pain, fevers/chills, bleeding, drainage from incision site, or other concerns. ? ?

## 2021-09-03 LAB — AEROBIC/ANAEROBIC CULTURE W GRAM STAIN (SURGICAL/DEEP WOUND)

## 2021-09-05 ENCOUNTER — Other Ambulatory Visit: Payer: Self-pay | Admitting: Family Medicine

## 2021-09-05 ENCOUNTER — Encounter: Payer: Medicare Other | Admitting: Family Medicine

## 2021-09-06 ENCOUNTER — Ambulatory Visit: Payer: Medicare Other | Admitting: Surgery

## 2021-09-06 ENCOUNTER — Telehealth (INDEPENDENT_AMBULATORY_CARE_PROVIDER_SITE_OTHER): Payer: Medicare Other | Admitting: Family Medicine

## 2021-09-06 ENCOUNTER — Encounter: Payer: Self-pay | Admitting: Surgery

## 2021-09-06 VITALS — BP 170/90 | HR 76 | Temp 98.4°F | Ht 63.0 in | Wt 125.0 lb

## 2021-09-06 DIAGNOSIS — K3532 Acute appendicitis with perforation and localized peritonitis, without abscess: Secondary | ICD-10-CM

## 2021-09-06 DIAGNOSIS — K3533 Acute appendicitis with perforation and localized peritonitis, with abscess: Secondary | ICD-10-CM

## 2021-09-06 DIAGNOSIS — G4700 Insomnia, unspecified: Secondary | ICD-10-CM | POA: Insufficient documentation

## 2021-09-06 MED ORDER — ZOLPIDEM TARTRATE 5 MG PO TABS: 2.5000 mg | ORAL_TABLET | Freq: Every evening | ORAL | 0 refills | Status: DC | PRN

## 2021-09-06 NOTE — Patient Instructions (Addendum)
We have removed your drain today. Finish all of your antibiotics.  ?You should keep a dressing over the drain site. You will have some drainage come out from this for a few days. When you shower, remove the dressing and massage the area in the shower to allow what is in there to come out. Let the warm soapy water run over the area, rinse well, and pat dry.  ? ?Call us if the area starts to get more painful, or you get spreading redness.  ? ?We will see you for a follow up in a couple of weeks.  ?

## 2021-09-06 NOTE — Progress Notes (Signed)
? ? ?MyChart Video Visit ? ? ? ?Virtual Visit via Video Note  ? ?This visit type was conducted due to national recommendations for restrictions regarding the COVID-19 Pandemic (e.g. social distancing) in an effort to limit this patient's exposure and mitigate transmission in our community. This patient is at least at moderate risk for complications without adequate follow up. This format is felt to be most appropriate for this patient at this time. Physical exam was limited by quality of the video and audio technology used for the visit.  ? ? ?Patient location: home ?Provider location: [facility] ?Persons involved in the visit: patient, provider  ? ?I discussed the limitations of evaluation and management by telemedicine and the availability of in person appointments. The patient expressed understanding and agreed to proceed. ? ?Patient: Dawn Bruce   DOB: 1946/05/12   76 y.o. Female  MRN: [id number] ?Visit Date: 09/06/2021 ? ?Today's healthcare provider: Lavon Paganini, MD  ? ?Chief Complaint  ?Patient presents with  ? Insomnia  ? ?Subjective  ?  ?HPI  ? ?Hospitalized at [facility] 4/12-4/14 for perforated appendicitis.  IR placed Perc drain in abscess.  She is tolerating abx well.  F/u with surgeon later today. ?  ?Going out of town for [employer] wedding this weekend.  Having trouble sleeping with the stress and recent hospitalization. Ambien has been helpful in the past. Never tried trazodone. ? ?Medications: ?Outpatient Medications Prior to Visit  ?Medication Sig  ? amoxicillin-clavulanate (AUGMENTIN) 875-125 MG tablet Take 1 tablet by mouth 2 (two) times daily for 12 days.  ? apixaban (ELIQUIS) 5 MG TABS tablet Take 1 tablet (5 mg total) by mouth 2 (two) times daily.  ? atorvastatin (LIPITOR) 40 MG tablet Take 1 tablet (40 mg total) by mouth every evening. (Patient taking differently: Take 40 mg by mouth daily.)  ? carvedilol (COREG) 6.25 MG tablet Take 6.25 mg by mouth 2 (two) times daily.  ?  empagliflozin (JARDIANCE) 10 MG TABS tablet Take 1 tablet by mouth daily.  ? famotidine (PEPCID) 40 MG tablet Take 1 tablet (40 mg total) by mouth daily.  ? fentaNYL (DURAGESIC) 50 MCG/HR Place 1 patch onto the skin every 3 (three) days.  ? pregabalin (LYRICA) 150 MG capsule TAKE 1 CAPSULE(150 MG) BY MOUTH TWICE DAILY (Patient taking differently: Take 150 mg by mouth 2 (two) times daily.)  ? valsartan (DIOVAN) 160 MG tablet Take 160 mg by mouth in the morning and at bedtime.  ? [DISCONTINUED] carboxymethylcellulose (REFRESH PLUS) 0.5 % SOLN Place 1 drop into both eyes 3 (three) times daily as needed (dryness).  ? [DISCONTINUED] oxyCODONE (OXY IR/ROXICODONE) 5 MG immediate release tablet Take 1 tablet (5 mg total) by mouth every 6 (six) hours as needed for severe pain or breakthrough pain.  ? ?No facility-administered medications prior to visit.  ? ? ?Review of Systems ? ? ? ? Objective  ?  ?There were no vitals taken for this visit. ? ? ? ? ?Physical Exam ?Constitutional:   ?   General: She is not in acute distress. ?   Appearance: Normal appearance.  ?HENT:  ?   Head: Normocephalic.  ?Pulmonary:  ?   Effort: Pulmonary effort is normal. No respiratory distress.  ?Neurological:  ?   Mental Status: She is alert and oriented to person, place, and time. Mental status is at baseline.  ?  ? ? ? Assessment & Plan  ?  ? ?Problem List Items Addressed This Visit   ? ?  ?  Other  ? Insomnia - Primary  ?  Intermittent problem ?Failed melatonin ?Consider trazodone in the future ?Small supply of low dose Ambien Rx'd for use this week prn ?Discussed that this is not a long term medications ?Discussed risks, esp in the elderly ? ?  ?  ?  ? ?Return if symptoms worsen or fail to improve.  ?  ? ?I discussed the assessment and treatment plan with the patient. The patient was provided an opportunity to ask questions and all were answered. The patient agreed with the plan and demonstrated an understanding of the instructions. ?  ?The  patient was advised to call back or seek an in-person evaluation if the symptoms worsen or if the condition fails to improve as anticipated. ? ?I, Lavon Paganini, MD, have reviewed all documentation for this visit. The documentation on 09/06/21 for the exam, diagnosis, procedures, and orders are all accurate and complete. ? ? ?Virginia Crews, MD, MPH ?[place] [facility]   ?

## 2021-09-06 NOTE — Progress Notes (Signed)
Surgical Clinic Progress/Follow-up Note  ? ?HPI:  ?76 y.o. Female presents to clinic for appendiceal abscess follow-up  Days follow the last evaluation. Patient reports  improvement/resolution of prior issues and has been tolerating regular diet with +flatus and normal BM's, denies N/V, fever/chills, CP, or SOB. ?Her percutaneous drain placement site is the only area of tenderness.  She brings in a record that confirms she has had minimal output over the last 3 days. ? ?Review of Systems:  ?Constitutional: denies fever/chills  ?ENT: denies sore throat, hearing problems  ?Respiratory: denies shortness of breath, wheezing  ?Cardiovascular: denies chest pain, palpitations  ?Gastrointestinal: denies abdominal pain, N/V, or diarrhea/and bowel function as per interval history ?Skin: Denies any other rashes or skin discolorations as per interval history ? ?Vital Signs:  ?BP (!) 170/90   Pulse 76   Temp 98.4 ?F (36.9 ?C)   Ht '5\' 3"'$  (1.6 m)   Wt 125 lb (56.7 kg)   SpO2 98%   BMI 22.14 kg/m?   ? ?Physical Exam:  ?Constitutional:  ?-- Normal body habitus  ?-- Awake, alert, and oriented x3  ?Pulmonary:  ?-- No crackles ?-- Equal breath sounds bilaterally ?-- Breathing non-labored at rest ?Cardiovascular:  ?-- S1, S2 present  ?-- No pericardial rubs  ?Gastrointestinal:  ?-- Soft and non-distended, non-tender/with no tenderness to palpation, no guarding/rebound tenderness ?-- No abdominal masses appreciated, pulsatile or otherwise  ?  ?Musculoskeletal / Integumentary:  ?-- Wounds or skin discoloration: None appreciated except minimal erythema at drain site exit ?-- Extremities: B/L UE and LE FROM, hands and feet warm, no edema  ? ?Laboratory studies:  ?Culture and sensitivity reviewed: Sensitive E. coli and Streptococcus, no resistances noted. ? ? ?Imaging: No new pertinent imaging available for review ? ? ?Assessment:  ?76 y.o. yo Female with a problem list including...  ?Patient Active Problem List  ? Diagnosis Date Noted   ? Insomnia 09/06/2021  ? Perforated appendicitis 08/31/2021  ? LUQ abdominal pain 07/04/2021  ? Senile purpura ([place]) 12/03/2020  ? Persistent atrial fibrillation ([place])   ? Prediabetes 10/08/2019  ? Snoring 10/08/2019  ? Stenosis of right carotid artery 10/03/2018  ? Hyperlipidemia 10/03/2018  ? GERD (gastroesophageal reflux disease) 10/03/2018  ? GAD (generalized anxiety disorder) 10/03/2018  ? Essential hypertension 10/02/2018  ? CKD (chronic kidney disease) stage 3, GFR 30-59 ml/min (HCC) 06/17/2018  ? Chronic forearm pain (Primary Area of Pain) (Left) 06/17/2018  ? Neurogenic pain 06/17/2018  ? Chronic pain syndrome 06/06/2018  ? Long term current use of opiate analgesic 06/06/2018  ?  ?presents to clinic for follow-up evaluation of appendiceal abscess, progressing well. ? ?Plan:  ? -Remove drain, this was completed without issue in the office.  Small amount of purulent drainage exited from drain site upon removal.  There was clear green-tinged serous fluid in the bulb and tubing prior to drain removal.  The drain bulb was functioning well.  The drain had been milked prior to its removal. ?            - return to clinic in a month or as needed, instructed to call office if any questions or concerns ? ?All of the above recommendations were discussed with the patient, and all of patient's s questions were answered to her expressed satisfaction. ? ?These notes generated with voice recognition software. I apologize for typographical errors. ? ?Ronny Bacon, MD, FACS ?[place] ?General Surgery - Partnering for exceptional care. ?Office: [phone]  ?

## 2021-09-06 NOTE — Assessment & Plan Note (Signed)
Intermittent problem ?Failed melatonin ?Consider trazodone in the future ?Small supply of low dose Ambien Rx'd for use this week prn ?Discussed that this is not a long term medications ?Discussed risks, esp in the elderly ?

## 2021-09-09 ENCOUNTER — Ambulatory Visit: Payer: Medicare Other

## 2021-09-12 ENCOUNTER — Ambulatory Visit: Payer: Medicare Other

## 2021-09-14 DIAGNOSIS — B3731 Acute candidiasis of vulva and vagina: Secondary | ICD-10-CM | POA: Diagnosis not present

## 2021-09-14 DIAGNOSIS — I10 Essential (primary) hypertension: Secondary | ICD-10-CM | POA: Diagnosis not present

## 2021-09-14 DIAGNOSIS — I5032 Chronic diastolic (congestive) heart failure: Secondary | ICD-10-CM | POA: Diagnosis not present

## 2021-09-15 IMAGING — DX DG CHEST 2V
2 series · 2 of 2 positions shown · non-contrast
Comparison: CT chest 09/28/2020

CLINICAL DATA: Pleural effusion post thoracentesis

EXAM:
CHEST - 2 VIEW

[chest pa]
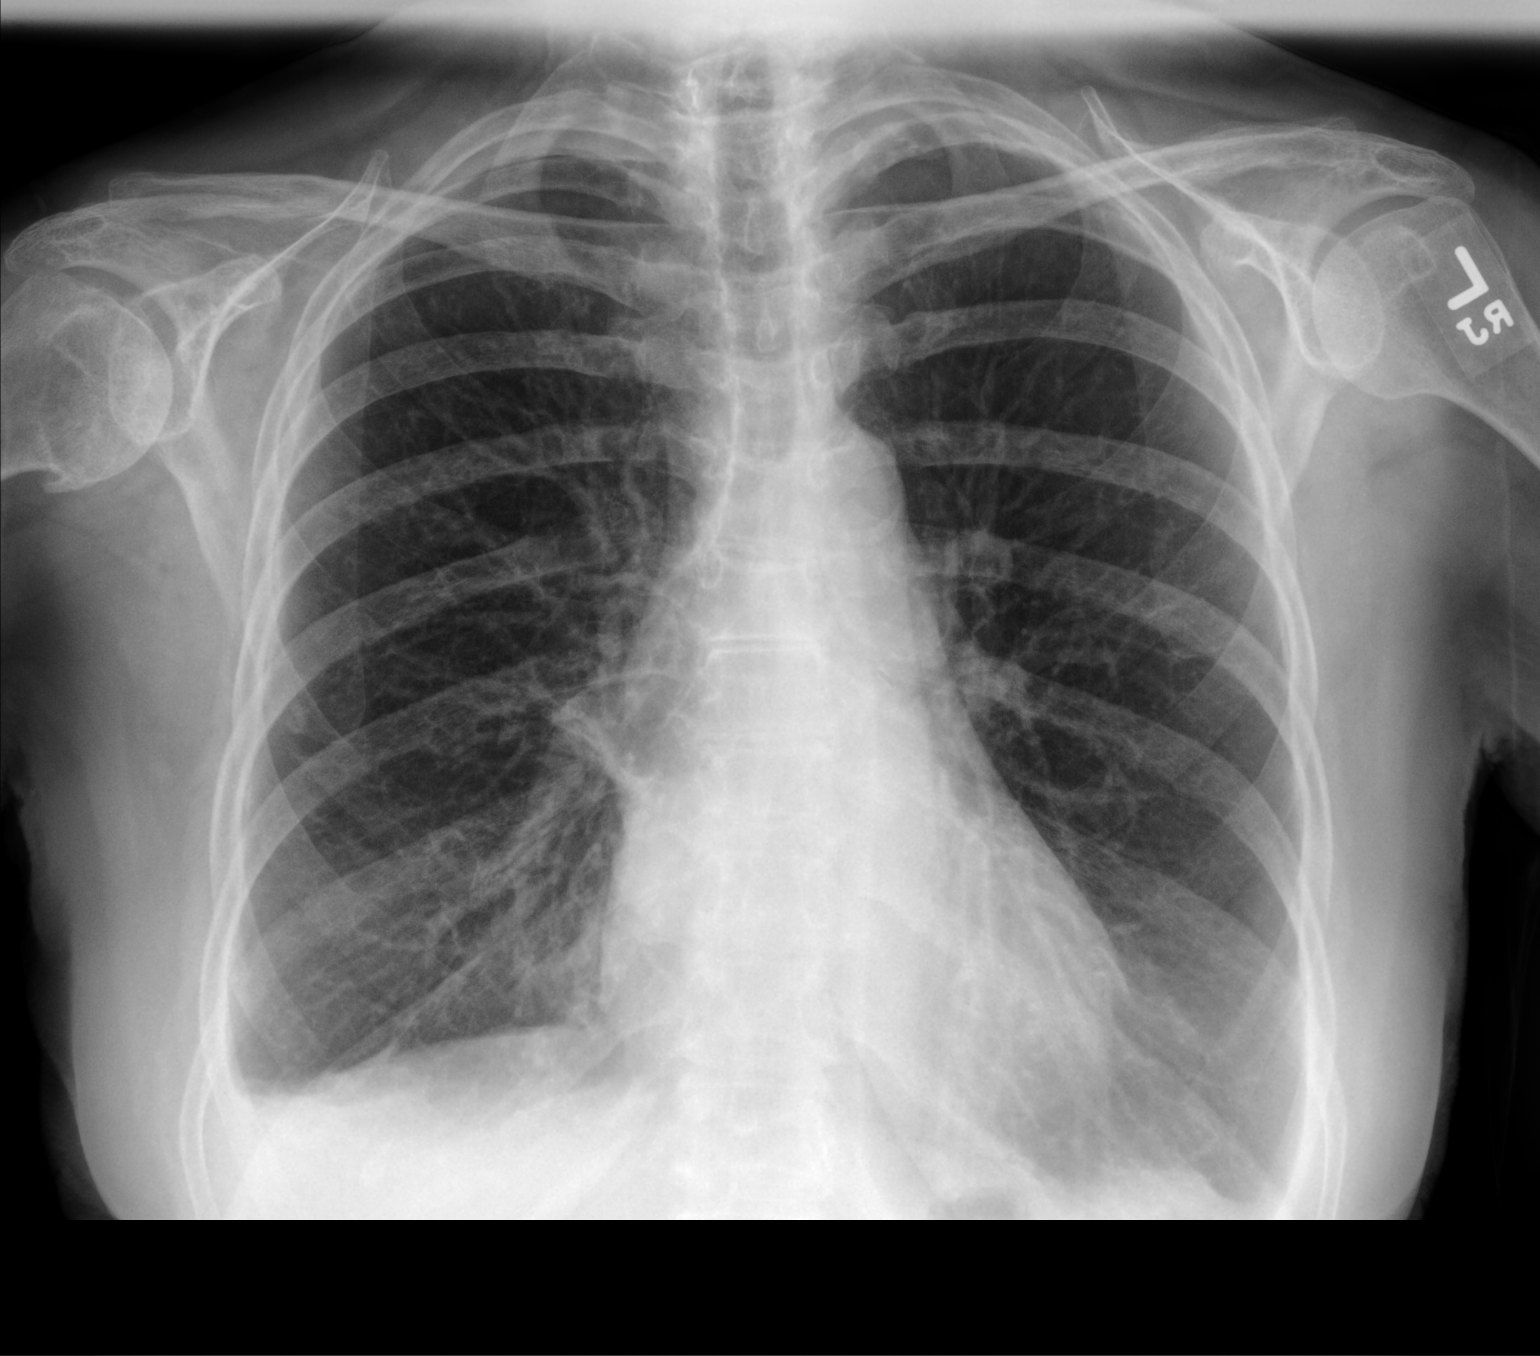

[chest lat]
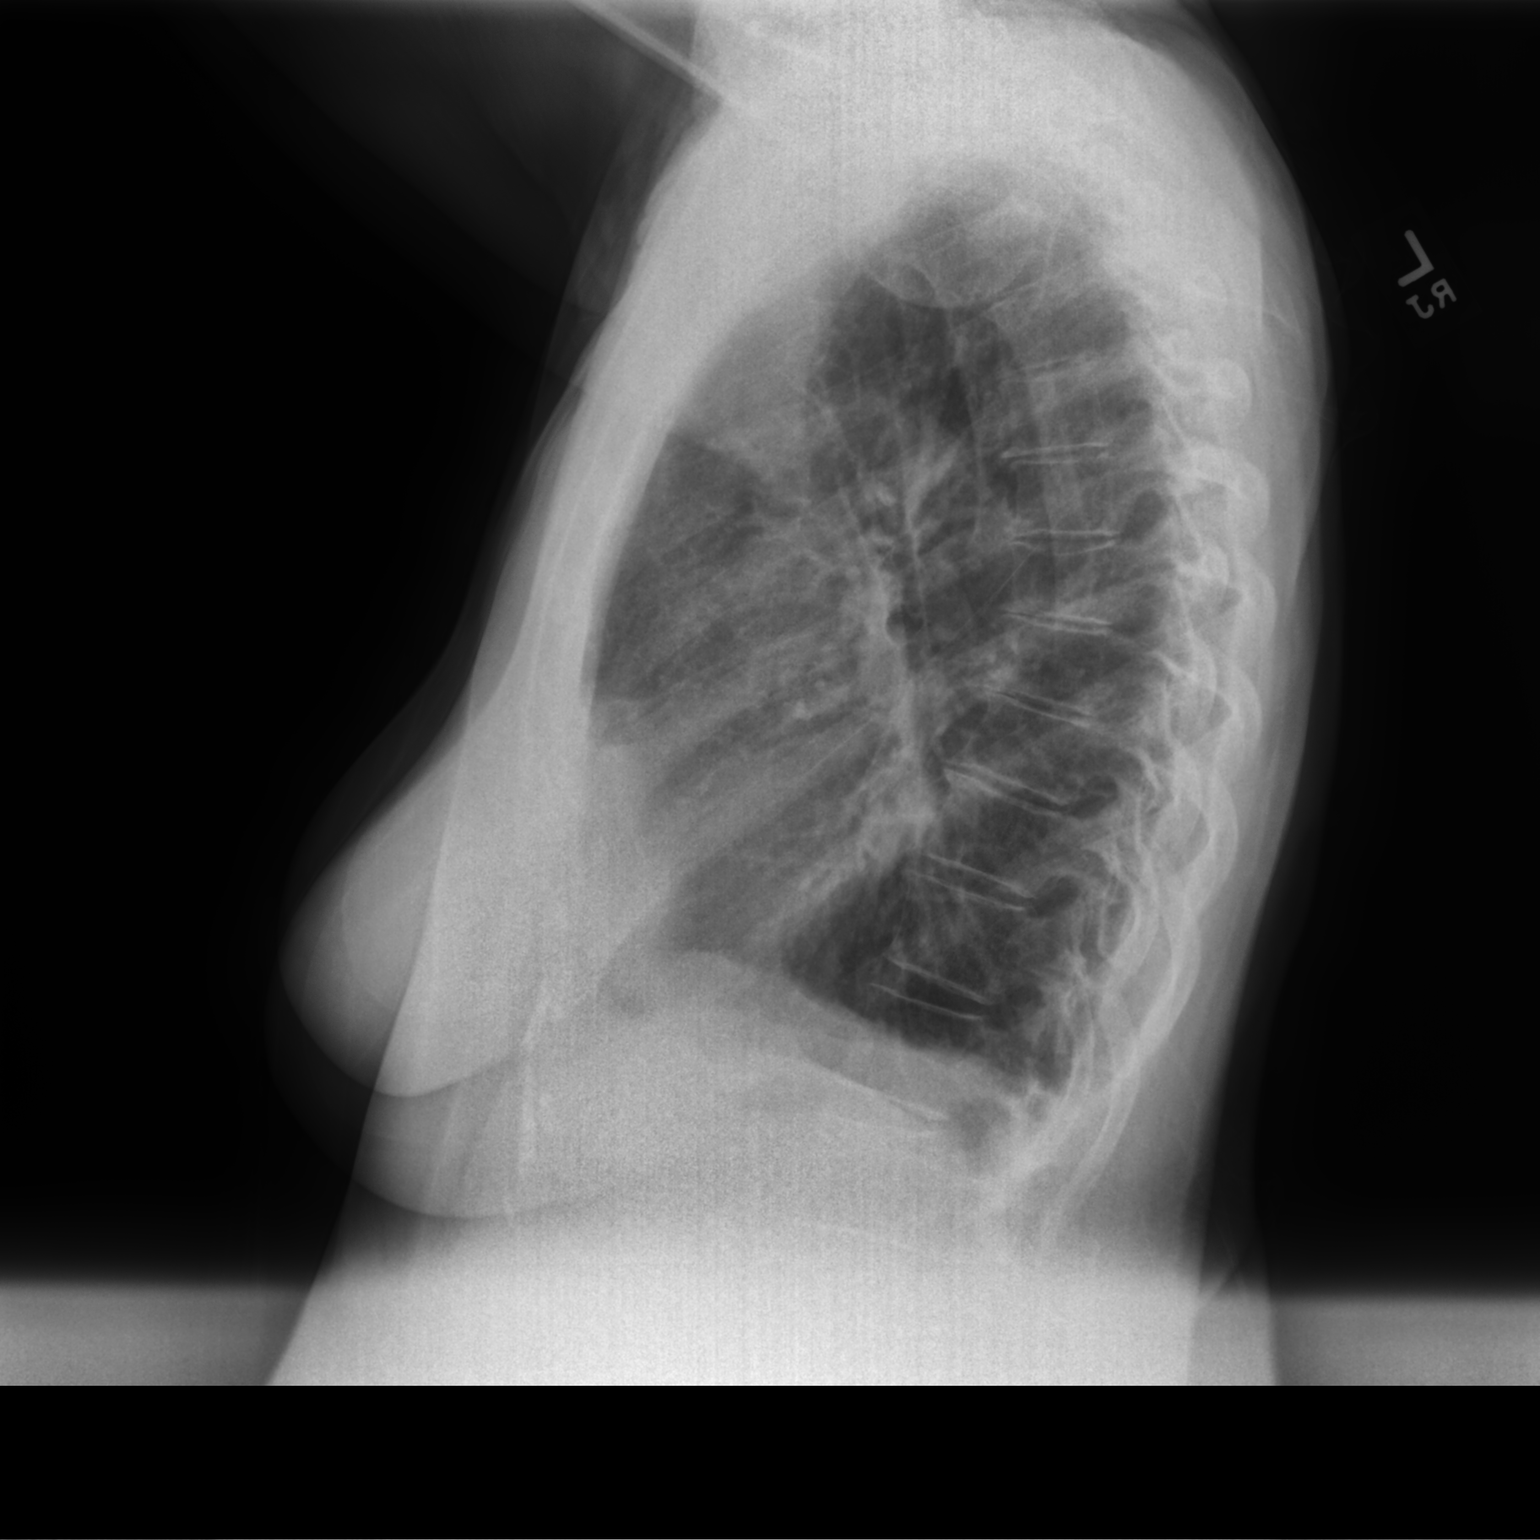

[2 of 2 positions shown; findings below may reference images not displayed]

FINDINGS: Normal heart size, mediastinal contours, and pulmonary vascularity.

Small bibasilar pleural effusions and atelectasis greater on RIGHT.
Decreased LEFT pleural effusion and basilar atelectasis post
thoracentesis.

No pulmonary infiltrate, pneumothorax or acute osseous findings.

Atherosclerotic calcification aorta.
IMPRESSION: Small bibasilar pleural effusions and atelectasis greater on RIGHT.

No pneumothorax following LEFT thoracentesis.

Aortic Atherosclerosis ([id number]-PRV.V).

## 2021-09-20 ENCOUNTER — Ambulatory Visit: Payer: Medicare Other | Admitting: Surgery

## 2021-09-20 ENCOUNTER — Encounter: Payer: Self-pay | Admitting: Surgery

## 2021-09-20 VITALS — BP 190/104 | HR 79 | Temp 97.7°F | Ht 63.0 in | Wt 120.0 lb

## 2021-09-20 DIAGNOSIS — K3532 Acute appendicitis with perforation and localized peritonitis, without abscess: Secondary | ICD-10-CM | POA: Diagnosis not present

## 2021-09-20 NOTE — Patient Instructions (Addendum)
If you have any concerns or questions, please feel free to call our office. Follow up in 2 months.  ? ?Appendicitis, Adult ? ?The appendix is a tube in the body that is shaped like a finger. It is attached to the large intestine. Appendicitis means that this tube is swollen (inflamed). If this is not treated, the tube can tear. This can lead to a life-threatening infection. This condition can also cause pus to build up in the appendix. ?What are the causes? ?Something blocking the appendix, such as: ?A ball of poop (stool). ?Lymph glands that are bigger than normal. ?Injury to the belly (abdomen). ?Sometimes the cause is not known. ?What increases the risk? ?You are more likely to get this condition if you are 17-95 years old. ?What are the signs or symptoms? ?Pain or tenderness that starts around the belly button and: ?Moves toward the lower right belly. ?Gets worse with time. ?Gets worse if you cough. ?Gets worse if you make a sudden move. ?Vomiting or feeling like you may vomit (nauseous). ?Not wanting to eat as much as normal (loss of appetite). ?A fever. ?Trouble pooping (constipation). ?Watery poop (diarrhea). ?Feel generally sick (malaise). ?How is this treated? ?In general, this condition is treated with both antibiotic medicines and surgery to take out the appendix. If only antibiotics are given and the appendix is not taken out, there is a chance the condition could come back. ?There are two ways to take out the appendix: ?Open surgery. For this method, the appendix is taken out through a large cut. This cut is called an incision and is made in the lower right belly. This surgery may be used if: ?You have scars from another surgery. ?You have a bleeding condition. ?You are pregnant and will be having your baby soon. ?You have a condition that makes it hard to do the other type of surgery. ?Laparoscopic surgery. For this method, the appendix is taken out through small cuts. Often, this surgery: ?Causes less  pain. ?Causes fewer problems. ?Heals faster. ?If your appendix tears and pus forms: ?A drain may be put into the sore. The drain will be used to get rid of the pus. ?You may get an antibiotic through an IV tube. ?Your appendix may or may not need to be taken out. ?Follow these instructions at home: ?If you had surgery: ?Follow instructions from your doctor on how to: ?Care for yourself at home. ?Take care of your cut from surgery. ?Medicines ?Take over-the-counter and prescription medicines only as told by your doctor. ?If you were prescribed an antibiotic medicine, take it as told by your doctor. Do not stop taking it even if you start to feel better. ?If told, take steps to prevent problems with pooping (constipation). You may need to: ?Drink enough fluid to keep your pee (urine) pale yellow. ?Take medicines. You will be told what medicines to take. ?Eat foods that are high in fiber. These include beans, whole grains, and fresh fruits and vegetables. ?Limit foods that are high in fat and sugar. These include fried or sweet foods. ?Ask your doctor if you should avoid driving or using machines while you are taking your medicine. ?General instructions ?Follow instructions from your doctor about what you cannot eat or drink. ?Do not smoke or use any products that contain nicotine or tobacco. If you need help quitting, ask your doctor. ?Return to your normal activities when your doctor says that it is safe. ?Keep all follow-up visits. ?Contact a doctor if: ?There  is pus, blood, or a lot of fluid coming from your cut or cuts from surgery. ?You feel like you may vomit, or you vomit. ?You have a fever. ?You are very tired. ?You have muscle pain. ?Get help right away if: ?You have pain in your belly, and the pain is getting worse. ?You are short of breath. ?You cannot stop vomiting. ?These symptoms may be an emergency. Get help right away. Call your local emergency services (911 in the [place] ?Do not wait to see if the  symptoms will go away. ?Do not drive yourself to the hospital. ?Summary ?Appendicitis is swelling of the appendix. The appendix is a tube that is shaped like a finger. It is joined to the large intestine. ?This condition may be caused by something that blocks the appendix. This can lead to an infection. ?This condition is most often treated with both antibiotic medicines and taking out the appendix. ?This information is not intended to replace advice given to you by your health care provider. Make sure you discuss any questions you have with your health care provider. ?Document Revised: 10/28/2020 Document Reviewed: 10/28/2020 ?Elsevier Patient Education ? [place]. ? ?

## 2021-09-21 ENCOUNTER — Encounter: Payer: Self-pay | Admitting: Family Medicine

## 2021-09-21 NOTE — Progress Notes (Signed)
Surgical Clinic Progress/Follow-up Note  ? ?HPI:  ?76 y.o. Female presents to clinic for appendiceal abscess follow-up  14 Days follow the last evaluation, when drain was removed. Patient reports  improvement/resolution of prior issues and has been tolerating regular diet with +flatus and normal BM's, denies N/V, fever/chills, CP, or SOB.  Now off all antibiotics.  ?Her percutaneous drain placement site has fully healed without further drainage.   ? ?Review of Systems:  ?Constitutional: denies fever/chills  ?ENT: denies sore throat, hearing problems  ?Respiratory: denies shortness of breath, wheezing  ?Cardiovascular: denies chest pain, palpitations  ?Gastrointestinal: denies abdominal pain, N/V, or diarrhea/and bowel function as per interval history ?Skin: Denies any other rashes or skin discolorations as per interval history ? ?Vital Signs:  ?BP (!) 190/104   Pulse 79   Temp 97.7 ?F (36.5 ?C) (Oral)   Ht '5\' 3"'$  (1.6 m)   Wt 120 lb (54.4 kg)   SpO2 99%   BMI 21.26 kg/m?   ? ?Physical Exam:  ?Constitutional:  ?-- Normal body habitus  ?-- Awake, alert, and oriented x3  ?Pulmonary:  ?-- No crackles ?-- Equal breath sounds bilaterally ?-- Breathing non-labored at rest ?Cardiovascular:  ?-- S1, S2 present  ?-- No pericardial rubs  ?Gastrointestinal:  ?-- Soft and non-distended, non-tender/with no tenderness to palpation, no guarding/rebound tenderness ?-- No abdominal masses appreciated, pulsatile or otherwise  ?  ?Musculoskeletal / Integumentary:  ?-- Wounds or skin discoloration: None appreciated except minimal erythema at drain site exit ?-- Extremities: B/L UE and LE FROM, hands and feet warm, no edema  ? ?Laboratory studies:  ?Culture and sensitivity reviewed: Sensitive E. coli and Streptococcus, no resistances noted. ? ? ?Imaging: No new pertinent imaging available for review ? ? ?Assessment:  ?76 y.o. yo Female with a problem list including...  ?Patient Active Problem List  ? Diagnosis Date Noted  ? Insomnia  09/06/2021  ? Perforated appendicitis 08/31/2021  ? LUQ abdominal pain 07/04/2021  ? Senile purpura ([place]) 12/03/2020  ? Persistent atrial fibrillation ([place])   ? Prediabetes 10/08/2019  ? Snoring 10/08/2019  ? Stenosis of right carotid artery 10/03/2018  ? Hyperlipidemia 10/03/2018  ? GERD (gastroesophageal reflux disease) 10/03/2018  ? GAD (generalized anxiety disorder) 10/03/2018  ? Essential hypertension 10/02/2018  ? CKD (chronic kidney disease) stage 3, GFR 30-59 ml/min (HCC) 06/17/2018  ? Chronic forearm pain (Primary Area of Pain) (Left) 06/17/2018  ? Neurogenic pain 06/17/2018  ? Chronic pain syndrome 06/06/2018  ? Long term current use of opiate analgesic 06/06/2018  ?  ?presents to clinic for follow-up evaluation of appendiceal abscess, progressing well. ? ?Plan:  ?  ?            - return to clinic in a month or as needed, instructed to call office if any questions or concerns ? ?All of the above recommendations were discussed with the patient, and all of patient's questions were answered to her expressed satisfaction. ? ?These notes generated with voice recognition software. I apologize for typographical errors. ? ?Ronny Bacon, MD, FACS ?[place]: [facility] ?General Surgery - Partnering for exceptional care. ?Office: [phone]  ?

## 2021-09-24 ENCOUNTER — Other Ambulatory Visit: Payer: Self-pay | Admitting: Physician Assistant

## 2021-09-24 ENCOUNTER — Other Ambulatory Visit: Payer: Self-pay | Admitting: Family Medicine

## 2021-09-24 DIAGNOSIS — M792 Neuralgia and neuritis, unspecified: Secondary | ICD-10-CM

## 2021-09-26 ENCOUNTER — Encounter: Payer: Self-pay | Admitting: Family Medicine

## 2021-09-27 ENCOUNTER — Ambulatory Visit (INDEPENDENT_AMBULATORY_CARE_PROVIDER_SITE_OTHER): Payer: Medicare Other | Admitting: Family Medicine

## 2021-09-27 ENCOUNTER — Encounter: Payer: Self-pay | Admitting: Family Medicine

## 2021-09-27 VITALS — BP 172/79 | HR 76 | Temp 99.1°F | Resp 16 | Ht 63.0 in | Wt 119.5 lb

## 2021-09-27 DIAGNOSIS — J440 Chronic obstructive pulmonary disease with acute lower respiratory infection: Secondary | ICD-10-CM | POA: Diagnosis not present

## 2021-09-27 DIAGNOSIS — I5032 Chronic diastolic (congestive) heart failure: Secondary | ICD-10-CM | POA: Insufficient documentation

## 2021-09-27 DIAGNOSIS — M792 Neuralgia and neuritis, unspecified: Secondary | ICD-10-CM

## 2021-09-27 DIAGNOSIS — I5033 Acute on chronic diastolic (congestive) heart failure: Secondary | ICD-10-CM | POA: Diagnosis not present

## 2021-09-27 MED ORDER — ALBUTEROL SULFATE HFA 108 (90 BASE) MCG/ACT IN AERS
2.0000 | INHALATION_SPRAY | Freq: Four times a day (QID) | RESPIRATORY_TRACT | 0 refills | Status: DC | PRN
Start: 2021-09-27 — End: 2021-10-10

## 2021-09-27 MED ORDER — FENTANYL 50 MCG/HR TD PT72: 1.0000 | MEDICATED_PATCH | TRANSDERMAL | 0 refills | Status: DC

## 2021-09-27 MED ORDER — AMOXICILLIN-POT CLAVULANATE 875-125 MG PO TABS: 1.0000 | ORAL_TABLET | Freq: Two times a day (BID) | ORAL | 0 refills | Status: DC

## 2021-09-27 MED ORDER — GUAIFENESIN-CODEINE 100-10 MG/5ML PO SOLN: 10.0000 mL | Freq: Every evening | ORAL | 0 refills | Status: DC | PRN

## 2021-09-27 MED ORDER — PREGABALIN 150 MG PO CAPS: 150.0000 mg | ORAL_CAPSULE | Freq: Two times a day (BID) | ORAL | 0 refills | Status: DC

## 2021-09-27 NOTE — Assessment & Plan Note (Signed)
Acute condition; hx of tobacco abuse, now in remission ?Acute symptoms likely combined atelectasis and hx of L pleural effusion ?Will treat with ABX today; encouraged to seek emergent care if needed within the next 7 days ?Use of PRN albuterol inhaler to assist   ?Cough syrup provided for quality sleep d/t nagging nature of cough ?

## 2021-09-27 NOTE — Assessment & Plan Note (Signed)
Hx of nerve damage; request refills today of Fentanyl patch and BID Lyrica ?Chronic, stable condition ?Denies exacerbations; however, patient is out of medication and wishes to have filled today to allow for 1 pharmacy trip ?

## 2021-09-27 NOTE — Assessment & Plan Note (Signed)
Chronic, unstable; followed by cards ?BP elevated today; appears to be chronically elevated 150s at last cards appt ?Patient reports taking OTC mucinex, reports that she did not take a medication with a decongestant that she would have received from behind the pharmacy counter/would have required signature  ? ?Hx of Afib; HR stable today ? ?Worsening lung sounds; will treat at this time for COPD with acute [place] ? ?1 month BP appt with PCP for further eval  ?

## 2021-09-27 NOTE — Progress Notes (Addendum)
?  ? ? ?Established patient visit ? ? ?I,Tiffany J Bragg,acting as a scribe for Gwyneth Sprout, FNP.,have documented all relevant documentation on the behalf of Gwyneth Sprout, FNP,as directed by  Gwyneth Sprout, FNP while in the presence of Gwyneth Sprout, FNP.  ? ?Patient: Dawn Bruce   DOB: 1946/04/09   76 y.o. Female  MRN: [id number] ?Visit Date: 09/27/2021 ? ?Today's healthcare provider: Gwyneth Sprout, FNP  ?Introduced to Designer, jewellery role and practice setting.  All questions answered.  Discussed provider/patient relationship and expectations. ? ? ?Chief Complaint  ?Patient presents with  ? URI  ? ?Subjective  ?  ?HPI  ?Upper respiratory symptoms ?She complains of congestion and cough described as productive.with fever to 99.1 degrees Fahrenheit. Onset of symptoms was a few days ago and gradually worsening.She is drinking plenty of fluids.  Past history is significant for no history of pneumonia or bronchitis. Patient is non-smoker ? ?---------------------------------------------------------------------------------------------------  ? ?Medications: ?Outpatient Medications Prior to Visit  ?Medication Sig  ? apixaban (ELIQUIS) 5 MG TABS tablet Take 1 tablet (5 mg total) by mouth 2 (two) times daily.  ? atorvastatin (LIPITOR) 40 MG tablet Take 1 tablet (40 mg total) by mouth every evening. (Patient taking differently: Take 40 mg by mouth daily.)  ? carvedilol (COREG) 6.25 MG tablet Take 6.25 mg by mouth 2 (two) times daily.  ? empagliflozin (JARDIANCE) 10 MG TABS tablet Take 1 tablet by mouth daily.  ? famotidine (PEPCID) 40 MG tablet Take 1 tablet (40 mg total) by mouth daily.  ? valsartan (DIOVAN) 160 MG tablet Take 160 mg by mouth in the morning and at bedtime.  ? [DISCONTINUED] fentaNYL (DURAGESIC) 50 MCG/HR Place 1 patch onto the skin every 3 (three) days.  ? [DISCONTINUED] pregabalin (LYRICA) 150 MG capsule TAKE 1 CAPSULE(150 MG) BY MOUTH TWICE DAILY (Patient taking differently: Take 150 mg by mouth 2  (two) times daily.)  ? ?No facility-administered medications prior to visit.  ? ? ?Review of Systems ? ? ?  Objective  ?  ?BP (!) 172/79 (BP Location: Right Arm, Patient Position: Sitting, Cuff Size: Normal)   Pulse 76   Temp 99.1 ?F (37.3 ?C)   Resp 16   Ht '5\' 3"'$  (1.6 m)   Wt 119 lb 8 oz (54.2 kg)   SpO2 97%   BMI 21.17 kg/m?  ? ? ?Physical Exam ?Vitals and nursing note reviewed.  ?Constitutional:   ?   General: She is not in acute distress. ?   Appearance: Normal appearance. She is normal weight. She is not ill-appearing, toxic-appearing or diaphoretic.  ?HENT:  ?   Head: Normocephalic and atraumatic.  ?   Right Ear: Tympanic membrane, ear canal and external ear normal. There is no impacted cerumen.  ?   Left Ear: Tympanic membrane, ear canal and external ear normal. There is no impacted cerumen.  ?   Nose: Nose normal. No congestion or rhinorrhea.  ?   Mouth/Throat:  ?   Mouth: Mucous membranes are moist.  ?   Pharynx: Oropharynx is clear. No oropharyngeal exudate or posterior oropharyngeal erythema.  ?Eyes:  ?   Extraocular Movements: Extraocular movements intact.  ?   Conjunctiva/sclera: Conjunctivae normal.  ?   Pupils: Pupils are equal, round, and reactive to light.  ?Cardiovascular:  ?   Rate and Rhythm: Normal rate and regular rhythm.  ?   Pulses: Normal pulses.  ?   Heart sounds: Normal heart sounds. No murmur heard. ?  No friction rub. No gallop.  ?Pulmonary:  ?   Effort: Pulmonary effort is normal. No respiratory distress.  ?   Breath sounds: Decreased air movement present. No stridor. Examination of the right-middle field reveals decreased breath sounds and wheezing. Examination of the left-middle field reveals decreased breath sounds and wheezing. Examination of the right-lower field reveals decreased breath sounds and wheezing. Examination of the left-lower field reveals decreased breath sounds, wheezing and rhonchi. Decreased breath sounds, wheezing and rhonchi present. No rales.  ?Chest:  ?    Chest wall: No tenderness.  ?Abdominal:  ?   General: Bowel sounds are normal.  ?   Palpations: Abdomen is soft.  ?Musculoskeletal:     ?   General: No swelling, tenderness, deformity or signs of injury. Normal range of motion.  ?   Right lower leg: No edema.  ?   Left lower leg: No edema.  ?Skin: ?   General: Skin is warm and dry.  ?   Capillary Refill: Capillary refill takes less than 2 seconds.  ?   Coloration: Skin is not jaundiced or pale.  ?   Findings: No bruising, erythema, lesion or rash.  ?Neurological:  ?   General: No focal deficit present.  ?   Mental Status: She is alert and oriented to person, place, and time. Mental status is at baseline.  ?   Cranial Nerves: No cranial nerve deficit.  ?   Sensory: No sensory deficit.  ?   Motor: No weakness.  ?   Coordination: Coordination normal.  ?Psychiatric:     ?   Mood and Affect: Mood normal.     ?   Behavior: Behavior normal.     ?   Thought Content: Thought content normal.     ?   Judgment: Judgment normal.  ?  ? ? ?No results found for any visits on 09/27/21. ? Assessment & Plan  ?  ? ?Problem List Items Addressed This Visit   ? ?  ? Cardiovascular and Mediastinum  ? Acute on chronic heart failure with preserved ejection fraction ([place])  ?  Chronic, unstable; followed by cards ?BP elevated today; appears to be chronically elevated 150s at last cards appt ?Patient reports taking OTC mucinex, reports that she did not take a medication with a decongestant that she would have received from behind the pharmacy counter/would have required signature  ? ?Hx of Afib; HR stable today ? ?Worsening lung sounds; will treat at this time for COPD with acute [place] ? ?1 month BP appt with PCP for further eval  ? ?  ?  ?  ? Respiratory  ? COPD with acute lower respiratory infection ([place]) - Primary  ?  Acute condition; hx of tobacco abuse, now in remission ?Acute symptoms likely combined atelectasis and hx of L pleural effusion ?Will treat with ABX today; encouraged to  seek emergent care if needed within the next 7 days ?Use of PRN albuterol inhaler to assist   ?Cough syrup provided for quality sleep d/t nagging nature of cough ? ?  ?  ? Relevant Medications  ? albuterol (VENTOLIN HFA) 108 (90 Base) MCG/ACT inhaler  ? amoxicillin-clavulanate (AUGMENTIN) 875-125 MG tablet  ? guaiFENesin-codeine 100-10 MG/5ML syrup  ?  ? Other  ? Neurogenic pain (Chronic)  ?  Hx of nerve damage; request refills today of Fentanyl patch and BID Lyrica ?Chronic, stable condition ?Denies exacerbations; however, patient is out of medication and wishes to have filled today to allow  for 1 pharmacy trip ? ?  ?  ? Relevant Medications  ? pregabalin (LYRICA) 150 MG capsule  ? fentaNYL (DURAGESIC) 50 MCG/HR  ? ? ? ?Return if symptoms worsen or fail to improve. In addition, recommend a 1 month appt for HTN f/u with PCP. ?   ? ?I, Gwyneth Sprout, FNP, have reviewed all documentation for this visit. The documentation on 09/27/21 for the exam, diagnosis, procedures, and orders are all accurate and complete. ? ? ? ?Gwyneth Sprout, FNP  ?[place] ?[phone] (phone) ?[phone] (fax) ? ?[facility]  ?

## 2021-10-05 NOTE — Progress Notes (Signed)
I,Sulibeya S Dimas,acting as a Education administrator for Lavon Paganini, MD.,have documented all relevant documentation on the behalf of Lavon Paganini, MD,as directed by  Lavon Paganini, MD while in the presence of Lavon Paganini, MD.   Annual Wellness Visit     Patient: Dawn Bruce, Female    DOB: August 05, 1945, 76 y.o.   MRN: [id number] Visit Date: 10/10/2021  Today's Provider: Lavon Paganini, MD   Chief Complaint  Patient presents with   Medicare Wellness   Subjective    Dawn Bruce is a 76 y.o. female who presents today for her Annual Wellness Visit. She reports consuming a general diet. Home exercise routine includes walking 40 hrs per week. She generally feels well. She reports sleeping well. She does not have additional problems to discuss today.   HPI  Cough is improved.    L foot metatarsalgia - feels like nerve pain. Fentanyl patch that she uses for her arm helps some with this.  Medications: Outpatient Medications Prior to Visit  Medication Sig   apixaban (ELIQUIS) 5 MG TABS tablet Take 1 tablet (5 mg total) by mouth 2 (two) times daily.   atorvastatin (LIPITOR) 40 MG tablet Take 1 tablet (40 mg total) by mouth every evening. (Patient taking differently: Take 40 mg by mouth daily.)   carvedilol (COREG) 6.25 MG tablet Take 6.25 mg by mouth 2 (two) times daily.   empagliflozin (JARDIANCE) 10 MG TABS tablet Take 1 tablet by mouth daily.   famotidine (PEPCID) 40 MG tablet Take 1 tablet (40 mg total) by mouth daily.   fentaNYL (DURAGESIC) 50 MCG/HR Place 1 patch onto the skin every 3 (three) days.   pregabalin (LYRICA) 150 MG capsule Take 1 capsule (150 mg total) by mouth 2 (two) times daily.   valsartan (DIOVAN) 160 MG tablet Take 160 mg by mouth in the morning and at bedtime.   [DISCONTINUED] albuterol (VENTOLIN HFA) 108 (90 Base) MCG/ACT inhaler Inhale 2 puffs into the lungs every 6 (six) hours as needed for wheezing or shortness of breath. (Patient not taking:  Reported on 10/10/2021)   [DISCONTINUED] azithromycin (ZITHROMAX) 250 MG tablet Take 2 tablets on day 1, then 1 tablet daily on days 2 through 5 (Patient not taking: Reported on 10/10/2021)   [DISCONTINUED] guaiFENesin-codeine 100-10 MG/5ML syrup Take 10 mLs by mouth at bedtime and may repeat dose one time if needed. (Patient not taking: Reported on 10/10/2021)   [DISCONTINUED] predniSONE (DELTASONE) 10 MG tablet Take '60mg'$  PO daily x1d, then '50mg'$  daily x1d, then '40mg'$  daily x1d, then '30mg'$  daily x1d, then '20mg'$  daily x1d, then '10mg'$  daily x1d, then stop (Patient not taking: Reported on 10/10/2021)   [DISCONTINUED] Tiotropium Bromide Monohydrate (SPIRIVA RESPIMAT) 2.5 MCG/ACT AERS Inhale 2 puffs into the lungs daily. (Patient not taking: Reported on 10/10/2021)   No facility-administered medications prior to visit.    No Known Allergies  Patient Care Team: Virginia Crews, MD as PCP - General (Family Medicine) Kate Sable, MD as PCP - Cardiology (Cardiology) Ralene Bathe, MD (Dermatology) Corliss Parish, MD as Consulting Physician (Nephrology) Lorelee Cover., MD (Ophthalmology)  Review of Systems  HENT:  Positive for rhinorrhea.   Endocrine: Positive for cold intolerance.  All other systems reviewed and are negative.  Last CBC Lab Results  Component Value Date   WBC 11.2 (H) 09/02/2021   HGB 11.9 (L) 09/02/2021   HCT 37.3 09/02/2021   MCV 86.7 09/02/2021   MCH 27.7 09/02/2021   RDW 15.2 09/02/2021   PLT  209 34/19/3790   Last metabolic panel Lab Results  Component Value Date   GLUCOSE 105 (H) 09/02/2021   NA 142 09/02/2021   K 3.7 09/02/2021   CL 109 09/02/2021   CO2 26 09/02/2021   BUN 27 (H) 09/02/2021   CREATININE 1.61 (H) 09/02/2021   GFRNONAA 33 (L) 09/02/2021   CALCIUM 8.5 (L) 09/02/2021   PHOS 4.9 (H) 09/01/2021   PROT 5.6 (L) 09/01/2021   ALBUMIN 2.2 (L) 09/01/2021   LABGLOB 2.7 08/30/2021   AGRATIO 1.1 (L) 08/30/2021   BILITOT 0.9 09/01/2021    ALKPHOS 66 09/01/2021   AST 22 09/01/2021   ALT 13 09/01/2021   ANIONGAP 7 09/02/2021   Last lipids Lab Results  Component Value Date   CHOL 178 04/20/2020   HDL 61 04/20/2020   LDLCALC 102 (H) 04/20/2020   TRIG 79 04/20/2020   CHOLHDL 2.9 04/20/2020   Last hemoglobin A1c Lab Results  Component Value Date   HGBA1C 6.5 (H) 08/31/2021   Last thyroid functions Lab Results  Component Value Date   TSH 2.68 01/03/2018   TSH 2.68 01/03/2018   Last vitamin D Lab Results  Component Value Date   25OHVITD2 3.3 06/06/2018   25OHVITD3 42 06/06/2018   VD25OH 40.9 01/03/2018   VD25OH 40.9 01/03/2018   Last vitamin B12 and Folate Lab Results  Component Value Date   VITAMINB12 444 06/06/2018        Objective    Vitals: BP (!) 159/84 (BP Location: Right Arm, Patient Position: Sitting, Cuff Size: Normal)   Temp 98 F (36.7 C) (Oral)   Resp 16   Ht '5\' 3"'$  (1.6 m)   Wt 118 lb 6.4 oz (53.7 kg)   BMI 20.97 kg/m  BP Readings from Last 3 Encounters:  10/10/21 (!) 159/84  10/06/21 140/86  09/27/21 (!) 172/79   Wt Readings from Last 3 Encounters:  10/10/21 118 lb 6.4 oz (53.7 kg)  10/06/21 118 lb (53.5 kg)  09/27/21 119 lb 8 oz (54.2 kg)       Physical Exam Vitals reviewed.  Constitutional:      General: She is not in acute distress.    Appearance: Normal appearance. She is well-developed. She is not diaphoretic.  HENT:     Head: Normocephalic and atraumatic.  Eyes:     General: No scleral icterus.    Conjunctiva/sclera: Conjunctivae normal.  Neck:     Thyroid: No thyromegaly.  Cardiovascular:     Rate and Rhythm: Normal rate and regular rhythm.     Pulses: Normal pulses.     Heart sounds: Normal heart sounds. No murmur heard. Pulmonary:     Effort: Pulmonary effort is normal. No respiratory distress.     Breath sounds: Normal breath sounds. No wheezing, rhonchi or rales.  Abdominal:     General: There is no distension.     Palpations: Abdomen is soft.      Tenderness: There is no abdominal tenderness.  Musculoskeletal:     Cervical back: Neck supple.     Right lower leg: No edema.     Left lower leg: No edema.  Lymphadenopathy:     Cervical: No cervical adenopathy.  Skin:    General: Skin is warm and dry.     Findings: No rash.  Neurological:     Mental Status: She is alert and oriented to person, place, and time. Mental status is at baseline.  Psychiatric:        Mood and Affect:  Mood normal.        Behavior: Behavior normal.     Most recent functional status assessment:    10/10/2021   12:44 PM  In your present state of health, do you have any difficulty performing the following activities:  Hearing? 0  Vision? 0  Difficulty concentrating or making decisions? 0  Walking or climbing stairs? 0  Dressing or bathing? 0  Doing errands, shopping? 0  Preparing Food and eating ? N  Using the Toilet? N  In the past six months, have you accidently leaked urine? Y  Do you have problems with loss of bowel control? N  Managing your Medications? N  Managing your Finances? N  Housekeeping or managing your Housekeeping? N   Most recent fall risk assessment:    10/10/2021   12:44 PM  [place] in the past year? 1  Number falls in past yr: 0  Injury with Fall? 0    Most recent depression screenings:    09/27/2021   11:08 AM 07/04/2021   11:41 AM  PHQ 2/9 Scores  PHQ - 2 Score 0 0  PHQ- 9 Score 0 0   Most recent cognitive screening:    10/10/2021    1:56 PM  6CIT Screen  What Year? 0 points  What month? 0 points  What time? 0 points  Count back from 20 0 points  Months in reverse 0 points  Repeat phrase 0 points  Total Score 0 points   Most recent Audit-C alcohol use screening    10/10/2021   12:44 PM  Alcohol Use Disorder Test (AUDIT)  1. How often do you have a drink containing alcohol? 4  2. How many drinks containing alcohol do you have on a typical day when you are drinking? 0  3. How often do you have  six or more drinks on one occasion? 0  AUDIT-C Score 4   A score of 3 or more in women, and 4 or more in men indicates increased risk for alcohol abuse, EXCEPT if all of the points are from question 1   No results found for any visits on 10/10/21.  Assessment & Plan     Annual wellness visit done today including the all of the following: Reviewed patient's Family Medical History Reviewed and updated list of patient's medical providers Assessment of cognitive impairment was done Assessed patient's functional ability Established a written schedule for health screening [place] Completed and Reviewed  Exercise Activities and Dietary recommendations  Goals      DIET - INCREASE WATER INTAKE     Recommend to drink at least 6-8 8oz glasses of water per day.        Immunization History  Administered Date(s) Administered   Fluad Quad(high Dose 65+) 04/09/2019   Hepatitis A 02/07/2011, 08/07/2011   Influenza Split 04/16/2012, 03/06/2013   Influenza, High Dose Seasonal PF 02/27/2014, 03/11/2015, 04/18/2016, 05/28/2017   Influenza-Unspecified 03/20/2020, 02/24/2021   PFIZER(Purple Top)SARS-COV-2 Vaccination 06/02/2019, 06/23/2019   Pneumococcal Conjugate-13 02/05/2014   Pneumococcal Polysaccharide-23 12/16/2009, 04/18/2016   Tdap 09/28/2007   Varicella 09/27/2009, 11/01/2017   Zoster Recombinat (Shingrix) 11/01/2017    Health Maintenance  Topic Date Due   TETANUS/TDAP  09/27/2017   Zoster Vaccines- Shingrix (2 of 2) 12/27/2017   COVID-19 Vaccine (3 - Pfizer risk series) 07/21/2019   INFLUENZA VACCINE  12/20/2021   Pneumonia Vaccine 59+ Years old  Completed   DEXA SCAN  Completed   Hepatitis  C Screening  Completed   HPV VACCINES  Aged Out   COLONOSCOPY (Pts 45-67yr Insurance coverage will need to be confirmed)  Discontinued     Discussed health benefits of physical activity, and encouraged her to engage in regular exercise appropriate for her age and  condition.    Problem List Items Addressed This Visit       Cardiovascular and Mediastinum   Essential hypertension    Chronic and uncontrolled Monitor home blood pressures Continue current medications Add amlodipine 5 mg daily, which patient was previously taking Reviewed recent metabolic panel       Relevant Medications   amLODipine (NORVASC) 5 MG tablet   Senile purpura (HCC)    Chronic and stable Continue to monitor       Relevant Medications   amLODipine (NORVASC) 5 MG tablet   Chronic diastolic heart failure (HCC)    No current exacerbation Stable Followed by cardiology No changes to medications today       Relevant Medications   amLODipine (NORVASC) 5 MG tablet     Other   Neurogenic pain (Chronic)    Chronic and stable 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     I, ALavon Paganini MD, have reviewed all documentation for this visit. The documentation on 10/10/21 for the exam, diagnosis, procedures, and orders are all accurate and complete.   Janeese Mcgloin, ADionne Bucy MD, MPH [facility]

## 2021-10-05 NOTE — Progress Notes (Signed)
I,Sulibeya S Dimas,acting as a Education administrator for Lavon Paganini, MD.,have documented all relevant documentation on the behalf of Lavon Paganini, MD,as directed by  Lavon Paganini, MD while in the presence of Lavon Paganini, MD.   Established patient visit   Patient: Dawn Bruce   DOB: 02-Nov-1945   76 y.o. Female  MRN: [id number] Visit Date: 10/06/2021  Today's healthcare provider: Lavon Paganini, MD   Chief Complaint  Patient presents with   Cough   Subjective    HPI  Follow up for cough  The patient was last seen for this 1 weeks ago. Changes made at last visit include start Augmentin 875-125 mg. Rx guifenesin-codeine 100-10 mg. Albuterol inhaler to use as needed for cough, sob, or wheezing. Patient reports using inhaler 2 puffs 2-3 times a day.  She reports excellent compliance with treatment.  She feels that condition is Unchanged. She is not having side effects.   -----------------------------------------------------------------------------------------   Medications: Outpatient Medications Prior to Visit  Medication Sig   albuterol (VENTOLIN HFA) 108 (90 Base) MCG/ACT inhaler Inhale 2 puffs into the lungs every 6 (six) hours as needed for wheezing or shortness of breath.   apixaban (ELIQUIS) 5 MG TABS tablet Take 1 tablet (5 mg total) by mouth 2 (two) times daily.   atorvastatin (LIPITOR) 40 MG tablet Take 1 tablet (40 mg total) by mouth every evening. (Patient taking differently: Take 40 mg by mouth daily.)   carvedilol (COREG) 6.25 MG tablet Take 6.25 mg by mouth 2 (two) times daily.   empagliflozin (JARDIANCE) 10 MG TABS tablet Take 1 tablet by mouth daily.   famotidine (PEPCID) 40 MG tablet Take 1 tablet (40 mg total) by mouth daily.   fentaNYL (DURAGESIC) 50 MCG/HR Place 1 patch onto the skin every 3 (three) days.   pregabalin (LYRICA) 150 MG capsule Take 1 capsule (150 mg total) by mouth 2 (two) times daily.   valsartan (DIOVAN) 160 MG tablet Take 160  mg by mouth in the morning and at bedtime.   [DISCONTINUED] amoxicillin-clavulanate (AUGMENTIN) 875-125 MG tablet Take 1 tablet by mouth 2 (two) times daily.   [DISCONTINUED] guaiFENesin-codeine 100-10 MG/5ML syrup Take 10 mLs by mouth at bedtime and may repeat dose one time if needed. (Patient not taking: Reported on 10/06/2021)   No facility-administered medications prior to visit.    Review of Systems  Constitutional:  Positive for appetite change. Negative for chills and fever.  HENT:  Positive for congestion, postnasal drip and rhinorrhea. Negative for ear discharge, sinus pressure, sinus pain, sneezing and sore throat.   Respiratory:  Positive for shortness of breath. Negative for chest tightness and wheezing.   Cardiovascular:  Negative for chest pain.       Objective    BP 140/86 (BP Location: Right Arm, Patient Position: Sitting, Cuff Size: Normal)   Pulse 84   Temp 97.6 F (36.4 C) (Oral)   Resp 16   Wt 118 lb (53.5 kg)   SpO2 97%   BMI 20.90 kg/m  BP Readings from Last 3 Encounters:  10/06/21 140/86  09/27/21 (!) 172/79  09/20/21 (!) 190/104   Wt Readings from Last 3 Encounters:  10/06/21 118 lb (53.5 kg)  09/27/21 119 lb 8 oz (54.2 kg)  09/20/21 120 lb (54.4 kg)      Physical Exam Vitals reviewed.  Constitutional:      General: She is not in acute distress.    Appearance: Normal appearance. She is well-developed. She is not diaphoretic.  HENT:     Head: Normocephalic and atraumatic.  Eyes:     General: No scleral icterus.    Conjunctiva/sclera: Conjunctivae normal.  Neck:     Thyroid: No thyromegaly.  Cardiovascular:     Rate and Rhythm: Normal rate and regular rhythm.     Pulses: Normal pulses.     Heart sounds: Normal heart sounds. No murmur heard. Pulmonary:     Effort: Pulmonary effort is normal. No respiratory distress.     Breath sounds: Wheezing (mild) present. No rhonchi or rales.  Musculoskeletal:     Cervical back: Neck supple.     Right  lower leg: No edema.     Left lower leg: No edema.  Lymphadenopathy:     Cervical: No cervical adenopathy.  Skin:    General: Skin is warm and dry.  Neurological:     Mental Status: She is alert. Mental status is at baseline.  Psychiatric:        Mood and Affect: Mood normal.        Behavior: Behavior normal.      No results found for any visits on 10/06/21.  Assessment & Plan     1. COPD with acute lower respiratory infection ([place]) -Patient has been previously well controlled not needed inhaler for controller of symptoms, but she has had acute worsening - She does have a history of needing thoracentesis - Her lungs are mostly clear today with no evidence of large collection of fluid again, but will get chest x-ray to reevaluate as she has not been improving - Continue cough medication which has been helpful - Also discussed the role of honey and cough - Has not been on a controller inhaler, but will try Spiriva for the next month - Also continue albuterol as needed - She has finished Augmentin, but we will try azithromycin instead - Prednisone taper as well - Further management pending chest x-ray - Return precautions discussed - DG Chest 2 View; Future - guaiFENesin-codeine 100-10 MG/5ML syrup; Take 10 mLs by mouth at bedtime and may repeat dose one time if needed.  Dispense: 120 mL; Refill: 0   Meds ordered this encounter  Medications   guaiFENesin-codeine 100-10 MG/5ML syrup    Sig: Take 10 mLs by mouth at bedtime and may repeat dose one time if needed.    Dispense:  120 mL    Refill:  0   Tiotropium Bromide Monohydrate (SPIRIVA RESPIMAT) 2.5 MCG/ACT AERS    Sig: Inhale 2 puffs into the lungs daily.    Dispense:  4 g    Refill:  0   predniSONE (DELTASONE) 10 MG tablet    Sig: Take '60mg'$  PO daily x1d, then '50mg'$  daily x1d, then '40mg'$  daily x1d, then '30mg'$  daily x1d, then '20mg'$  daily x1d, then '10mg'$  daily x1d, then stop    Dispense:  21 tablet    Refill:  0   azithromycin  (ZITHROMAX) 250 MG tablet    Sig: Take 2 tablets on day 1, then 1 tablet daily on days 2 through 5    Dispense:  6 tablet    Refill:  0     Return if symptoms worsen or fail to improve.      I, Lavon Paganini, MD, have reviewed all documentation for this visit. The documentation on 10/06/21 for the exam, diagnosis, procedures, and orders are all accurate and complete.   Torrian Canion, Dionne Bucy, MD, MPH [facility]

## 2021-10-06 ENCOUNTER — Ambulatory Visit (INDEPENDENT_AMBULATORY_CARE_PROVIDER_SITE_OTHER): Payer: Medicare Other | Admitting: Family Medicine

## 2021-10-06 ENCOUNTER — Ambulatory Visit
Admission: RE | Admit: 2021-10-06 | Discharge: 2021-10-06 | Disposition: A | Discharge status: Home / Self Care | Payer: Medicare Other | Attending: Family Medicine | Admitting: Family Medicine

## 2021-10-06 ENCOUNTER — Encounter: Payer: Self-pay | Admitting: Family Medicine

## 2021-10-06 ENCOUNTER — Ambulatory Visit
Admission: RE | Admit: 2021-10-06 | Discharge: 2021-10-06 | Disposition: A | Discharge status: Home / Self Care | Payer: Medicare Other | Source: Ambulatory Visit | Attending: Family Medicine | Admitting: Family Medicine

## 2021-10-06 ENCOUNTER — Ambulatory Visit: Payer: Medicare Other | Admitting: Family Medicine

## 2021-10-06 DIAGNOSIS — R059 Cough, unspecified: Secondary | ICD-10-CM | POA: Diagnosis not present

## 2021-10-06 DIAGNOSIS — J440 Chronic obstructive pulmonary disease with acute lower respiratory infection: Secondary | ICD-10-CM | POA: Diagnosis not present

## 2021-10-06 DIAGNOSIS — R0602 Shortness of breath: Secondary | ICD-10-CM | POA: Diagnosis not present

## 2021-10-06 DIAGNOSIS — J90 Pleural effusion, not elsewhere classified: Secondary | ICD-10-CM | POA: Diagnosis not present

## 2021-10-06 DIAGNOSIS — J9811 Atelectasis: Secondary | ICD-10-CM | POA: Diagnosis not present

## 2021-10-06 MED ORDER — GUAIFENESIN-CODEINE 100-10 MG/5ML PO SOLN: 10.0000 mL | Freq: Every evening | ORAL | 0 refills | Status: DC | PRN

## 2021-10-06 MED ORDER — AZITHROMYCIN 250 MG PO TABS: ORAL_TABLET | ORAL | 0 refills | Status: DC

## 2021-10-06 MED ORDER — SPIRIVA RESPIMAT 2.5 MCG/ACT IN AERS: 2.0000 | INHALATION_SPRAY | Freq: Every day | RESPIRATORY_TRACT | 0 refills | Status: DC

## 2021-10-06 MED ORDER — PREDNISONE 10 MG PO TABS: ORAL_TABLET | ORAL | 0 refills | Status: DC

## 2021-10-10 ENCOUNTER — Encounter: Payer: Self-pay | Admitting: Family Medicine

## 2021-10-10 ENCOUNTER — Ambulatory Visit (INDEPENDENT_AMBULATORY_CARE_PROVIDER_SITE_OTHER): Payer: Medicare Other | Admitting: Family Medicine

## 2021-10-10 VITALS — BP 159/84 | Temp 98.0°F | Resp 16 | Ht 63.0 in | Wt 118.4 lb

## 2021-10-10 DIAGNOSIS — D692 Other nonthrombocytopenic purpura: Secondary | ICD-10-CM | POA: Diagnosis not present

## 2021-10-10 DIAGNOSIS — I5032 Chronic diastolic (congestive) heart failure: Secondary | ICD-10-CM | POA: Diagnosis not present

## 2021-10-10 DIAGNOSIS — R7303 Prediabetes: Secondary | ICD-10-CM

## 2021-10-10 DIAGNOSIS — M792 Neuralgia and neuritis, unspecified: Secondary | ICD-10-CM

## 2021-10-10 DIAGNOSIS — I10 Essential (primary) hypertension: Secondary | ICD-10-CM | POA: Diagnosis not present

## 2021-10-10 DIAGNOSIS — Z0000 Encounter for general adult medical examination without abnormal findings: Secondary | ICD-10-CM | POA: Diagnosis not present

## 2021-10-10 DIAGNOSIS — E782 Mixed hyperlipidemia: Secondary | ICD-10-CM

## 2021-10-10 MED ORDER — AMLODIPINE BESYLATE 5 MG PO TABS: 5.0000 mg | ORAL_TABLET | Freq: Every day | ORAL | 1 refills | Status: DC

## 2021-10-10 NOTE — Assessment & Plan Note (Signed)
Previously well controlled Continue statin Repeat FLP and CMP Goal LDL < 70 

## 2021-10-10 NOTE — Patient Instructions (Signed)
Check on TDAP and shingrix vaccines at the pharmacy  Make sure you are taking the amlodipine daily  Consider metatarsal pads for your foot

## 2021-10-10 NOTE — Assessment & Plan Note (Signed)
Chronic and uncontrolled Monitor home blood pressures Continue current medications Add amlodipine 5 mg daily, which patient was previously taking Reviewed recent metabolic panel

## 2021-10-10 NOTE — Assessment & Plan Note (Signed)
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

## 2021-10-10 NOTE — Assessment & Plan Note (Signed)
Chronic and stable Continue to monitor 

## 2021-10-10 NOTE — Assessment & Plan Note (Signed)
No current exacerbation Stable Followed by cardiology No changes to medications today

## 2021-10-10 NOTE — Assessment & Plan Note (Signed)
Chronic and stable Recent steroids may have increased her A1c slightly Recheck at next visit

## 2021-10-17 IMAGING — DX DG CHEST 2V
2 series · 2 of 2 positions shown · non-contrast
Comparison: 10/28/2020

CLINICAL DATA: Pleural effusion.  Status post thoracentesis.

EXAM:
CHEST - 2 VIEW

[chest pa]
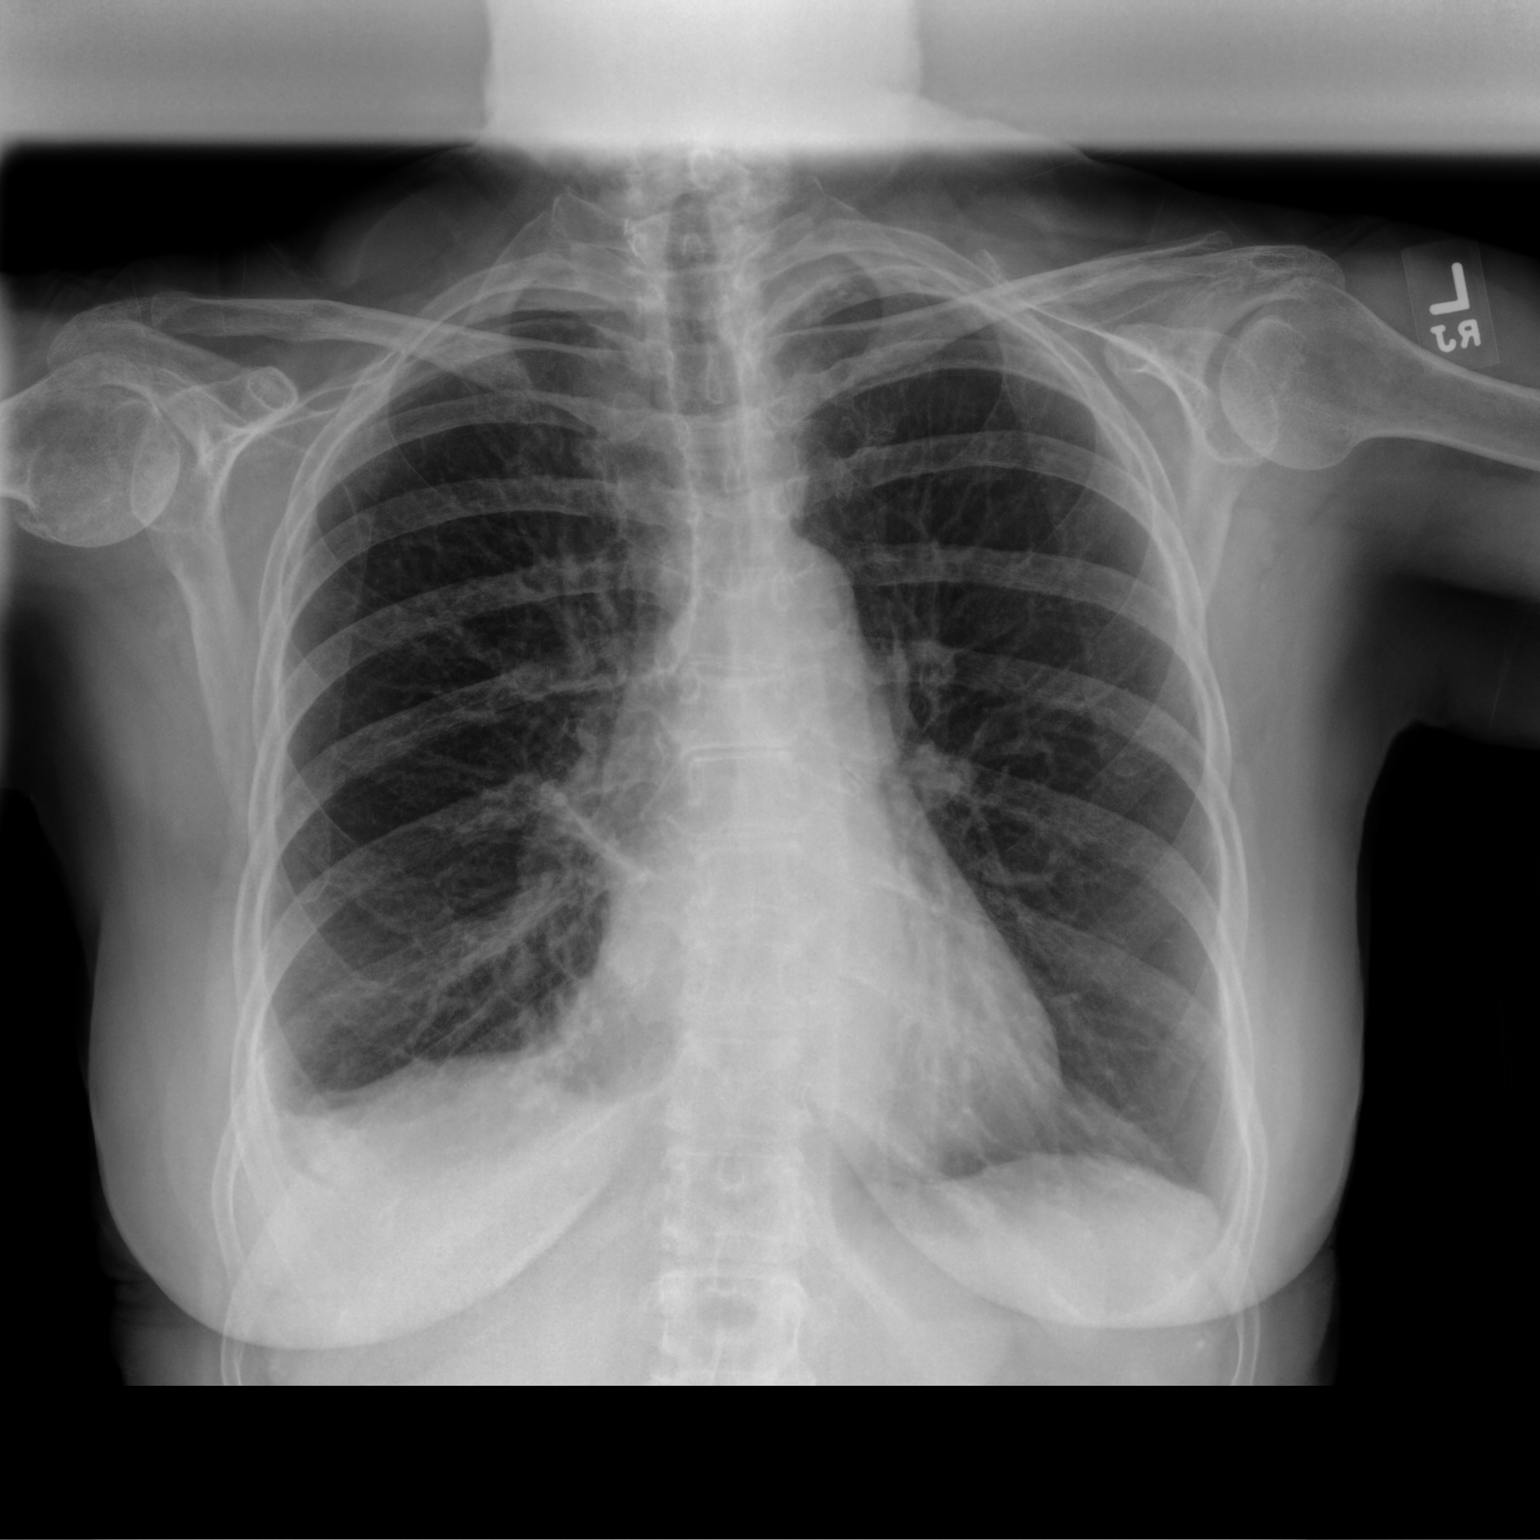

[chest lat]
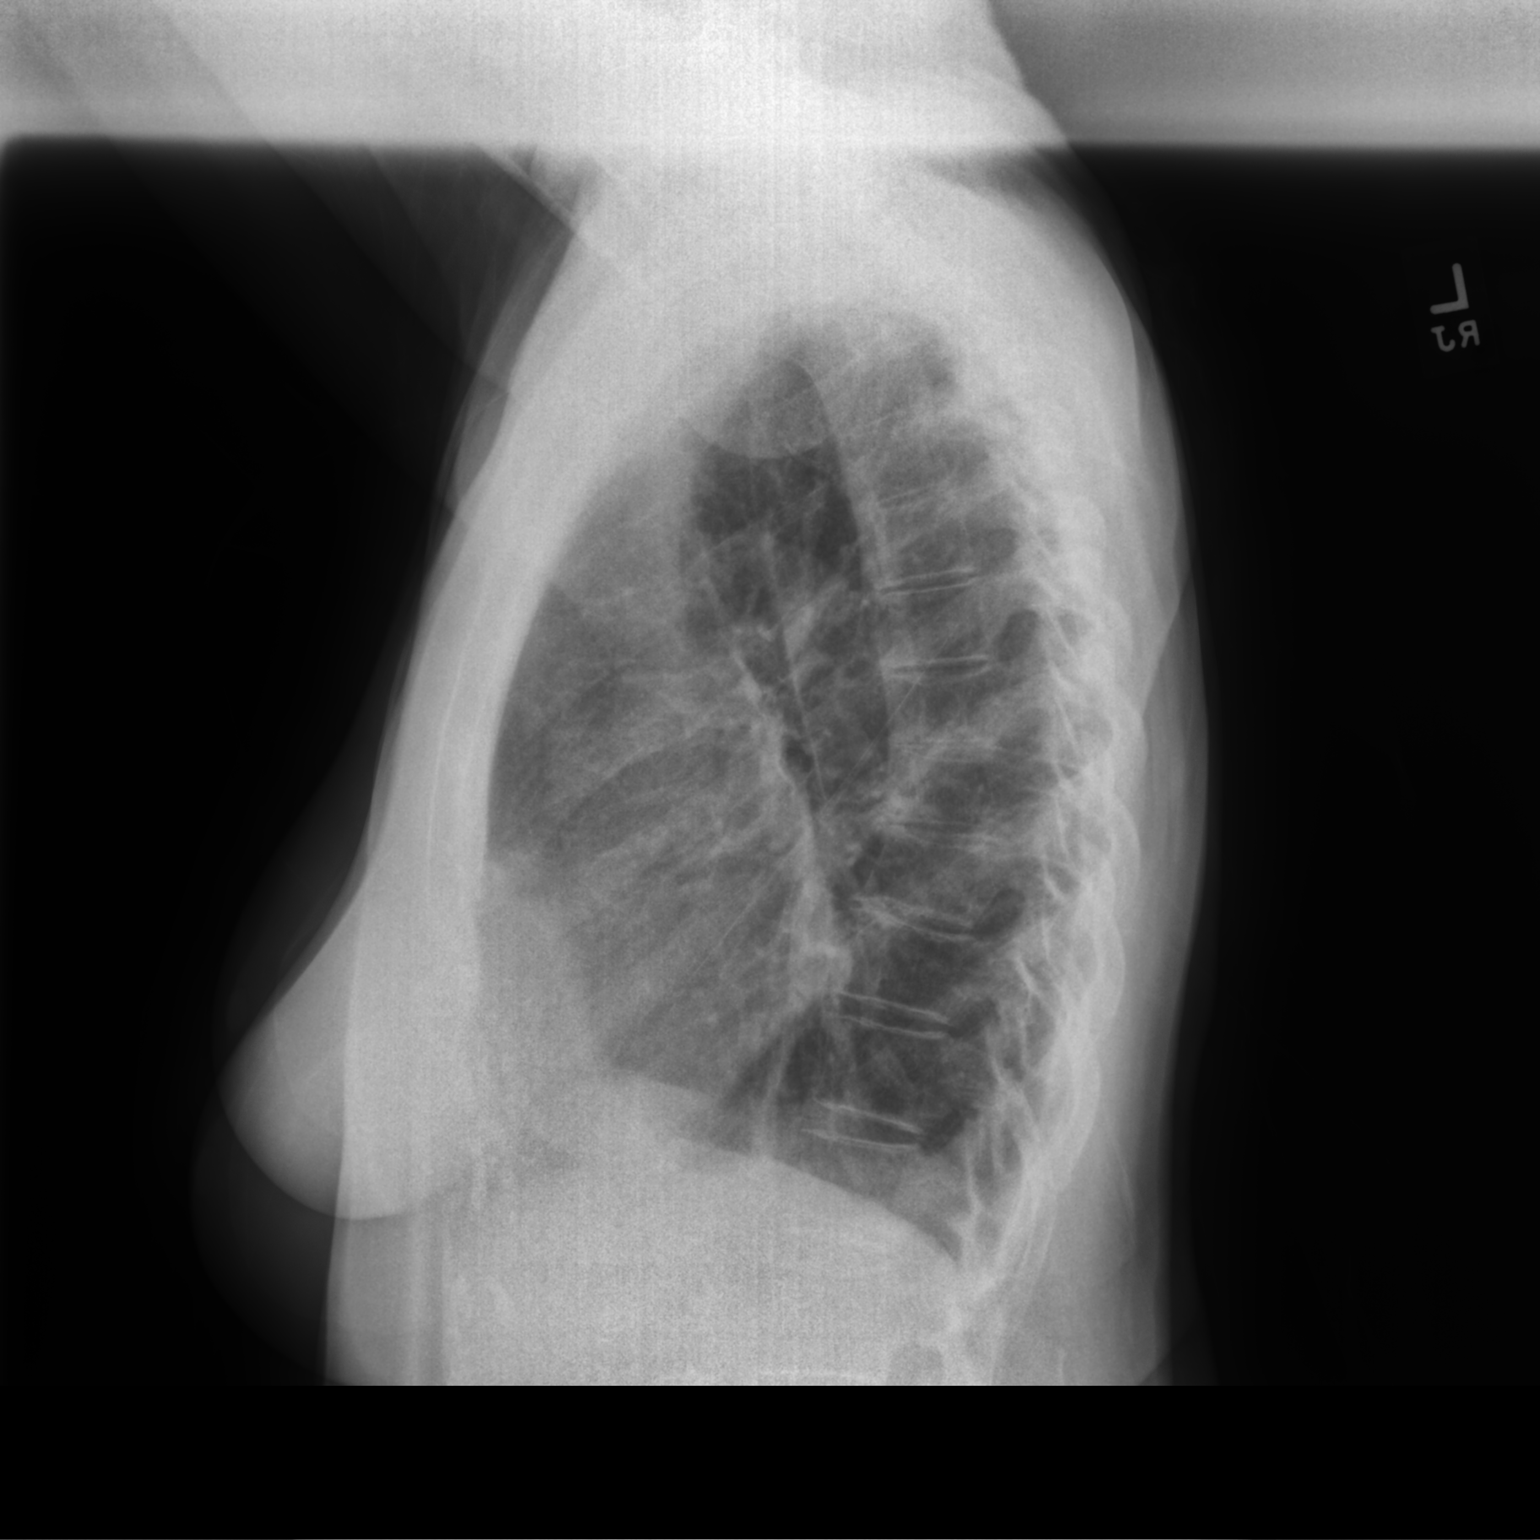

[2 of 2 positions shown; findings below may reference images not displayed]

FINDINGS: Tiny right pleural effusion . No evidence for left effusion. Mild
hyperexpansion evident bilaterally. No focal airspace consolidation.
No pneumothorax. The cardiopericardial silhouette is within normal
limits for size. The visualized bony structures of the thorax show
no acute abnormality.
IMPRESSION: Tiny right pleural effusion.

## 2021-10-25 DIAGNOSIS — E782 Mixed hyperlipidemia: Secondary | ICD-10-CM | POA: Diagnosis not present

## 2021-10-26 LAB — LIPID PANEL
Chol/HDL Ratio: 2.8 ratio (ref 0.0–4.4)
Cholesterol, Total: 201 mg/dL — ABNORMAL HIGH (ref 100–199)
HDL: 71 mg/dL (ref >39)
LDL Chol Calc (NIH): 108 mg/dL — ABNORMAL HIGH (ref 0–99)
Triglycerides: 125 mg/dL (ref 0–149)
VLDL Cholesterol Cal: 22 mg/dL (ref 5–40)

## 2021-10-27 ENCOUNTER — Ambulatory Visit: Payer: Medicare Other | Admitting: Family Medicine

## 2021-10-31 ENCOUNTER — Ambulatory Visit: Payer: Medicare Other | Admitting: Dermatology

## 2021-10-31 ENCOUNTER — Encounter: Payer: Self-pay | Admitting: Dermatology

## 2021-10-31 DIAGNOSIS — D099 Carcinoma in situ, unspecified: Secondary | ICD-10-CM

## 2021-10-31 DIAGNOSIS — Z1283 Encounter for screening for malignant neoplasm of skin: Secondary | ICD-10-CM | POA: Diagnosis not present

## 2021-10-31 DIAGNOSIS — Z85828 Personal history of other malignant neoplasm of skin: Secondary | ICD-10-CM

## 2021-10-31 DIAGNOSIS — D0462 Carcinoma in situ of skin of left upper limb, including shoulder: Secondary | ICD-10-CM | POA: Diagnosis not present

## 2021-10-31 DIAGNOSIS — D489 Neoplasm of uncertain behavior, unspecified: Secondary | ICD-10-CM

## 2021-10-31 DIAGNOSIS — Z86007 Personal history of in-situ neoplasm of skin: Secondary | ICD-10-CM | POA: Diagnosis not present

## 2021-10-31 DIAGNOSIS — L578 Other skin changes due to chronic exposure to nonionizing radiation: Secondary | ICD-10-CM

## 2021-10-31 DIAGNOSIS — D229 Melanocytic nevi, unspecified: Secondary | ICD-10-CM

## 2021-10-31 DIAGNOSIS — D1800 Hemangioma unspecified site: Secondary | ICD-10-CM

## 2021-10-31 DIAGNOSIS — L570 Actinic keratosis: Secondary | ICD-10-CM

## 2021-10-31 DIAGNOSIS — L814 Other melanin hyperpigmentation: Secondary | ICD-10-CM

## 2021-10-31 DIAGNOSIS — L821 Other seborrheic keratosis: Secondary | ICD-10-CM

## 2021-10-31 DIAGNOSIS — D692 Other nonthrombocytopenic purpura: Secondary | ICD-10-CM | POA: Diagnosis not present

## 2021-10-31 HISTORY — DX: Carcinoma in situ, unspecified: D09.9

## 2021-10-31 MED ORDER — FLUOROURACIL 5 % EX CREA: TOPICAL_CREAM | Freq: Two times a day (BID) | CUTANEOUS | 1 refills | Status: DC

## 2021-10-31 NOTE — Progress Notes (Signed)
Follow-Up Visit   Subjective  Dawn Bruce is a 76 y.o. female who presents for the following: Annual Exam (6 month tbse. Patient reports a spot at left arm she would like checked. Hx of scc, aks, isks ). The patient presents for Total-Body Skin Exam (TBSE) for skin cancer screening and mole check.  The patient has spots, moles and lesions to be evaluated, some may be new or changing and the patient has concerns that these could be cancer.  The following portions of the chart were reviewed this encounter and updated as appropriate:  Tobacco  Allergies  Meds  Problems  Med Hx  Surg Hx  Fam Hx     Review of Systems: No other skin or systemic complaints except as noted in HPI or Assessment and Plan.  Objective  Well appearing patient in no apparent distress; mood and affect are within normal limits.  A full examination was performed including scalp, head, eyes, ears, nose, lips, neck, chest, axillae, abdomen, back, buttocks, bilateral upper extremities, bilateral lower extremities, hands, feet, fingers, toes, fingernails, and toenails. All findings within normal limits unless otherwise noted below.  arms and hands x 12 (12) Erythematous thin papules/macules with gritty scale. Erythematous thin papules/macules with gritty scale.   left distal tricep 0.8 cm keratotic papule         Assessment & Plan  Actinic keratosis (12) arms and hands x 12 Actinic keratoses are precancerous spots that appear secondary to cumulative UV radiation exposure/sun exposure over time. They are chronic with expected duration over 1 year. A portion of actinic keratoses will progress to squamous cell carcinoma of the skin. It is not possible to reliably predict which spots will progress to skin cancer and so treatment is recommended to prevent development of skin cancer.  Recommend daily broad spectrum sunscreen SPF 30+ to sun-exposed areas, reapply every 2 hours as needed.  Recommend staying in the  shade or wearing long sleeves, sun glasses (UVA+UVB protection) and wide brim hats (4-inch brim around the entire circumference of the hat). Call for new or changing lesions. Destruction of lesion - arms and hands x 12 Complexity: simple   Destruction method: cryotherapy   Informed consent: discussed and consent obtained   Timeout:  patient name, date of birth, surgical site, and procedure verified Lesion destroyed using liquid nitrogen: Yes   Region frozen until ice ball extended beyond lesion: Yes   Outcome: patient tolerated procedure well with no complications   Post-procedure details: wound care instructions given    fluorouracil (EFUDEX) 5 % cream - arms and hands x 12 Apply topically 2 (two) times daily. Use for 1 week to forehead, temples, and nose  Neoplasm of uncertain behavior left distal tricep Epidermal / dermal shaving  Lesion diameter (cm):  0.8 Informed consent: discussed and consent obtained   Timeout: patient name, date of birth, surgical site, and procedure verified   Procedure prep:  Patient was prepped and draped in usual sterile fashion Prep type:  Isopropyl alcohol Anesthesia: the lesion was anesthetized in a standard fashion   Anesthetic:  1% lidocaine w/ epinephrine 1-100,000 buffered w/ 8.4% NaHCO3 Instrument used: flexible razor blade   Hemostasis achieved with: pressure, aluminum chloride and electrodesiccation   Outcome: patient tolerated procedure well   Post-procedure details: sterile dressing applied and wound care instructions given   Dressing type: bandage and petrolatum    Destruction of lesion Complexity: extensive   Destruction method: electrodesiccation and curettage   Informed consent: discussed and  consent obtained   Timeout:  patient name, date of birth, surgical site, and procedure verified Procedure prep:  Patient was prepped and draped in usual sterile fashion Prep type:  Isopropyl alcohol Anesthesia: the lesion was anesthetized in a  standard fashion   Anesthetic:  1% lidocaine w/ epinephrine 1-100,000 buffered w/ 8.4% NaHCO3 Curettage performed in three different directions: Yes   Electrodesiccation performed over the curetted area: Yes   Lesion length (cm):  0.8 Lesion width (cm):  0.8 Margin per side (cm):  0.2 Final wound size (cm):  1.2 Hemostasis achieved with:  pressure, aluminum chloride and electrodesiccation Outcome: patient tolerated procedure well with no complications   Post-procedure details: sterile dressing applied and wound care instructions given   Dressing type: bandage and petrolatum    Specimen 1 - Surgical pathology Differential Diagnosis: r/o scc Check Margins: No R/o scc   Skin cancer screening  Lentigines - Scattered tan macules - Due to sun exposure - Benign-appearing, observe - Recommend daily broad spectrum sunscreen SPF 30+ to sun-exposed areas, reapply every 2 hours as needed. - Call for any changes  Purpura - Chronic; persistent and recurrent.  Treatable, but not curable. - Violaceous macules and patches - Benign - Related to trauma, age, sun damage and/or use of blood thinners, chronic use of topical and/or oral steroids - Observe - Can use OTC arnica containing moisturizer such as Dermend Bruise Formula if desired - Call for worsening or other concerns  Seborrheic Keratoses - Stuck-on, waxy, tan-brown papules and/or plaques  - Benign-appearing - Discussed benign etiology and prognosis. - Observe - Call for any changes  Melanocytic Nevi - Tan-brown and/or pink-flesh-colored symmetric macules and papules - Benign appearing on exam today - Observation - Call clinic for new or changing moles - Recommend daily use of broad spectrum spf 30+ sunscreen to sun-exposed areas.   Hemangiomas - Red papules - Discussed benign nature - Observe - Call for any changes  Actinic Damage - Severe, confluent actinic changes with pre-cancerous actinic keratoses  - Severe, chronic,  not at goal, secondary to cumulative UV radiation exposure over time - diffuse scaly erythematous macules and papules with underlying dyspigmentation - Discussed Prescription "Field Treatment" for Severe, Chronic Confluent Actinic Changes with Pre-Cancerous Actinic Keratoses Field treatment involves treatment of an entire area of skin that has confluent Actinic Changes (Sun/ Ultraviolet light damage) and PreCancerous Actinic Keratoses by method of PhotoDynamic Therapy (PDT) and/or prescription Topical Chemotherapy agents such as 5-fluorouracil, 5-fluorouracil/calcipotriene, and/or imiquimod.  The purpose is to decrease the number of clinically evident and subclinical PreCancerous lesions to prevent progression to development of skin cancer by chemically destroying early precancer changes that may or may not be visible.  It has been shown to reduce the risk of developing skin cancer in the treated area. As a result of treatment, redness, scaling, crusting, and open sores may occur during treatment course. One or more than one of these methods may be used and may have to be used several times to control, suppress and eliminate the PreCancerous changes. Discussed treatment course, expected reaction, and possible side effects. - Recommend daily broad spectrum sunscreen SPF 30+ to sun-exposed areas, reapply every 2 hours as needed.  - Staying in the shade or wearing long sleeves, sun glasses (UVA+UVB protection) and wide brim hats (4-inch brim around the entire circumference of the hat) are also recommended. - Call for new or changing lesions.  Start 5-fluorouracil/calcipotriene cream twice a day for 7 days to affected areas including  temples forehead and nose. Prescription sent to [facility]. Patient provided with contact information for pharmacy and advised the pharmacy will mail the prescription to their home. Patient provided with handout reviewing treatment course and side effects and advised to call  or message [place] on MyChart with any concerns.  History of Squamous Cell Carcinoma in Situ of the Skin Multiple sites see history  - Recommend regular full body skin exams - Recommend daily broad spectrum sunscreen SPF 30+ to sun-exposed areas, reapply every 2 hours as needed.  - Call if any new or changing lesions are noted between office visits  History of Squamous Cell Carcinoma of the Skin - No evidence of recurrence today 09/10/19 left dorsum wrist ED&C 10/28/19, right dorsum hand 03/04/20 - No lymphadenopathy - Recommend regular full body skin exams - Recommend daily broad spectrum sunscreen SPF 30+ to sun-exposed areas, reapply every 2 hours as needed.  - Call if any new or changing lesions are noted between office visits  History of Basal Cell Carcinoma of the Skin - No evidence of recurrence today see history right upper back paraspinal 05/02/21 - Recommend regular full body skin exams - Recommend daily broad spectrum sunscreen SPF 30+ to sun-exposed areas, reapply every 2 hours as needed.  - Call if any new or changing lesions are noted between office visits  Skin cancer screening performed today. Return in about 6 months (around 05/02/2022) for TBSE hx of sccis, bcc . I, Ruthell Rummage, CMA, am acting as scribe for Sarina Ser, MD. Documentation: I have reviewed the above documentation for accuracy and completeness, and I agree with the above.  Sarina Ser, MD

## 2021-10-31 NOTE — Patient Instructions (Addendum)
Start 5-fluorouracil/calcipotriene cream twice a day for 7 days to affected areas including forehead, temples and nose. Prescription sent to [facility]. Patient provided with contact information for pharmacy and advised the pharmacy will mail the prescription to their home. Patient provided with handout reviewing treatment course and side effects and advised to call or message [place] on MyChart with any concerns.      5-Fluorouracil/Calcipotriene Patient Education   Actinic keratoses are the dry, red scaly spots on the skin caused by sun damage. A portion of these spots can turn into skin cancer with time, and treating them can help prevent development of skin cancer.   Treatment of these spots requires removal of the defective skin cells. There are various ways to remove actinic keratoses, including freezing with liquid nitrogen, treatment with creams, or treatment with a blue light procedure in the office.   5-fluorouracil cream is a topical cream used to treat actinic keratoses. It works by interfering with the growth of abnormal fast-growing skin cells, such as actinic keratoses. These cells peel off and are replaced by healthy ones.   5-fluorouracil/calcipotriene is a combination of the 5-fluorouracil cream with a vitamin D analog cream called calcipotriene. The calcipotriene alone does not treat actinic keratoses. However, when it is combined with 5-fluorouracil, it helps the 5-fluorouracil treat the actinic keratoses much faster so that the same results can be achieved with a much shorter treatment time.  INSTRUCTIONS FOR 5-FLUOROURACIL/CALCIPOTRIENE CREAM:   5-fluorouracil/calcipotriene cream typically only needs to be used for 4-7 days. A thin layer should be applied twice a day to the treatment areas recommended by your physician.   If your physician prescribed you separate tubes of 5-fluourouracil and calcipotriene, apply a thin layer of 5-fluorouracil followed by a thin layer of  calcipotriene.   Avoid contact with your eyes, nostrils, and mouth. Do not use 5-fluorouracil/calcipotriene cream on infected or open wounds.   You will develop redness, irritation and some crusting at areas where you have pre-cancer damage/actinic keratoses. IF YOU DEVELOP PAIN, BLEEDING, OR SIGNIFICANT CRUSTING, STOP THE TREATMENT EARLY - you have already gotten a good response and the actinic keratoses should clear up well.  Wash your hands after applying 5-fluorouracil 5% cream on your skin.   A moisturizer or sunscreen with a minimum SPF 30 should be applied each morning.   Once you have finished the treatment, you can apply a thin layer of Vaseline twice a day to irritated areas to soothe and calm the areas more quickly. If you experience significant discomfort, contact your physician.  For some patients it is necessary to repeat the treatment for best results.  SIDE EFFECTS: When using 5-fluorouracil/calcipotriene cream, you may have mild irritation, such as redness, dryness, swelling, or a mild burning sensation. This usually resolves within 2 weeks. The more actinic keratoses you have, the more redness and inflammation you can expect during treatment. Eye irritation has been reported rarely. If this occurs, please let us know.  If you have any trouble using this cream, please call the office. If you have any other questions about this information, please do not hesitate to ask me before you leave the office.      Biopsy Wound Care Instructions  Leave the original bandage on for 24 hours if possible.  If the bandage becomes soaked or soiled before that time, it is OK to remove it and examine the wound.  A small amount of post-operative bleeding is normal.  If excessive bleeding occurs, remove the  bandage, place gauze over the site and apply continuous pressure (no peeking) over the area for 30 minutes. If this does not work, please call our clinic as soon as possible or page your  doctor if it is after hours.   Once a day, cleanse the wound with soap and water. It is fine to shower. If a thick crust develops you may use a Q-tip dipped into dilute hydrogen peroxide (mix 1:1 with water) to dissolve it.  Hydrogen peroxide can slow the healing process, so use it only as needed.    After washing, apply petroleum jelly (Vaseline) or an antibiotic ointment if your doctor prescribed one for you, followed by a bandage.    For best healing, the wound should be covered with a layer of ointment at all times. If you are not able to keep the area covered with a bandage to hold the ointment in place, this may mean re-applying the ointment several times a day.  Continue this wound care until the wound has healed and is no longer open.   Itching and mild discomfort is normal during the healing process. However, if you develop pain or severe itching, please call our office.   If you have any discomfort, you can take Tylenol (acetaminophen) or ibuprofen as directed on the bottle. (Please do not take these if you have an allergy to them or cannot take them for another reason).  Some redness, tenderness and white or yellow material in the wound is normal healing.  If the area becomes very sore and red, or develops a thick yellow-green material (pus), it may be infected; please notify us.    If you have stitches, return to clinic as directed to have the stitches removed. You will continue wound care for 2-3 days after the stitches are removed.   Wound healing continues for up to one year following surgery. It is not unusual to experience pain in the scar from time to time during the interval.  If the pain becomes severe or the scar thickens, you should notify the office.    A slight amount of redness in a scar is expected for the first six months.  After six months, the redness will fade and the scar will soften and fade.  The color difference becomes less noticeable with time.  If there are any  problems, return for a post-op surgery check at your earliest convenience.  To improve the appearance of the scar, you can use silicone scar gel, cream, or sheets (such as Mederma or Serica) every night for up to one year. These are available over the counter (without a prescription).  Please call our office at [phone] for any questions or concerns.    Electrodesiccation and Curettage ("Scrape and Burn") Wound Care Instructions  Leave the original bandage on for 24 hours if possible.  If the bandage becomes soaked or soiled before that time, it is OK to remove it and examine the wound.  A small amount of post-operative bleeding is normal.  If excessive bleeding occurs, remove the bandage, place gauze over the site and apply continuous pressure (no peeking) over the area for 30 minutes. If this does not work, please call our clinic as soon as possible or page your doctor if it is after hours.   Once a day, cleanse the wound with soap and water. It is fine to shower. If a thick crust develops you may use a Q-tip dipped into dilute hydrogen peroxide (mix 1:1 with water)  to dissolve it.  Hydrogen peroxide can slow the healing process, so use it only as needed.    After washing, apply petroleum jelly (Vaseline) or an antibiotic ointment if your doctor prescribed one for you, followed by a bandage.    For best healing, the wound should be covered with a layer of ointment at all times. If you are not able to keep the area covered with a bandage to hold the ointment in place, this may mean re-applying the ointment several times a day.  Continue this wound care until the wound has healed and is no longer open. It may take several weeks for the wound to heal and close.  Itching and mild discomfort is normal during the healing process.  If you have any discomfort, you can take Tylenol (acetaminophen) or ibuprofen as directed on the bottle. (Please do not take these if you have an allergy to them or  cannot take them for another reason).  Some redness, tenderness and white or yellow material in the wound is normal healing.  If the area becomes very sore and red, or develops a thick yellow-green material (pus), it may be infected; please notify us.    Wound healing continues for up to one year following surgery. It is not unusual to experience pain in the scar from time to time during the interval.  If the pain becomes severe or the scar thickens, you should notify the office.    A slight amount of redness in a scar is expected for the first six months.  After six months, the redness will fade and the scar will soften and fade.  The color difference becomes less noticeable with time.  If there are any problems, return for a post-op surgery check at your earliest convenience.  To improve the appearance of the scar, you can use silicone scar gel, cream, or sheets (such as Mederma or Serica) every night for up to one year. These are available over the counter (without a prescription).  Please call our office at [phone] for any questions or concerns.      Actinic keratoses are precancerous spots that appear secondary to cumulative UV radiation exposure/sun exposure over time. They are chronic with expected duration over 1 year. A portion of actinic keratoses will progress to squamous cell carcinoma of the skin. It is not possible to reliably predict which spots will progress to skin cancer and so treatment is recommended to prevent development of skin cancer.  Recommend daily broad spectrum sunscreen SPF 30+ to sun-exposed areas, reapply every 2 hours as needed.  Recommend staying in the shade or wearing long sleeves, sun glasses (UVA+UVB protection) and wide brim hats (4-inch brim around the entire circumference of the hat). Call for new or changing lesions.   Cryotherapy Aftercare  Wash gently with soap and water everyday.   Apply Vaseline and Band-Aid daily until healed.     Melanoma ABCDEs  Melanoma is the most dangerous type of skin cancer, and is the leading cause of death from skin disease.  You are more likely to develop melanoma if you: Have light-colored skin, light-colored eyes, or red or blond hair Spend a lot of time in the sun Tan regularly, either outdoors or in a tanning bed Have had blistering sunburns, especially during childhood Have a close family member who has had a melanoma Have atypical moles or large birthmarks  Early detection of melanoma is key since treatment is typically straightforward and cure rates are extremely high  if we catch it early.   The first sign of melanoma is often a change in a mole or a new dark spot.  The ABCDE system is a way of remembering the signs of melanoma.  A for asymmetry:  The two halves do not match. B for border:  The edges of the growth are irregular. C for color:  A mixture of colors are present instead of an even brown color. D for diameter:  Melanomas are usually (but not always) greater than 57m - the size of a pencil eraser. E for evolution:  The spot keeps changing in size, shape, and color.  Please check your skin once per month between visits. You can use a small mirror in front and a large mirror behind you to keep an eye on the back side or your body.   If you see any new or changing lesions before your next follow-up, please call to schedule a visit.  Please continue daily skin protection including broad spectrum sunscreen SPF 30+ to sun-exposed areas, reapplying every 2 hours as needed when you're outdoors.   Staying in the shade or wearing long sleeves, sun glasses (UVA+UVB protection) and wide brim hats (4-inch brim around the entire circumference of the hat) are also recommended for sun protection.    Due to recent changes in healthcare laws, you may see results of your pathology and/or laboratory studies on MyChart before the doctors have had a chance to review them. We understand  that in some cases there may be results that are confusing or concerning to you. Please understand that not all results are received at the same time and often the doctors may need to interpret multiple results in order to provide you with the best plan of care or course of treatment. Therefore, we ask that you please give u[place]2 business days to thoroughly review all your results before contacting the office for clarification. Should we see a critical lab result, you will be contacted sooner.   If You Need Anything After Your Visit  If you have any questions or concerns for your doctor, please call our main line at [phone] press option 4 to reach your doctor's medical assistant. If no one answers, please leave a voicemail as directed and we will return your call as soon as possible. Messages left after 4 pm will be answered the following business day.   You may also send [place] message via [place] We typically respond to MyChart messages within 1-2 business days.  For prescription refills, please ask your pharmacy to contact our office. Our fax number is [phone]  If you have an urgent issue when the clinic is closed that cannot wait until the next business day, you can page your doctor at the number below.    Please note that while we do our best to be available for urgent issues outside of office hours, we are not available 24/7.   If you have an urgent issue and are unable to reach u[place] you may choose to seek medical care at your doctor's office, retail clinic, urgent care center, or emergency room.  If you have a medical emergency, please immediately call 911 or go to the emergency department.  Pager Numbers  - Dr. KNehemiah Massed [phone] - Dr. MLaurence Ferrari [phone] - Dr. SNicole Kindred [phone] In the event of inclement weather, please call our main line at [phone] an update on the status of any delays or closures.  Dermatology Medication  Tips: Please keep the  boxes that topical medications come in in order to help keep track of the instructions about where and how to use these. Pharmacies typically print the medication instructions only on the boxes and not directly on the medication tubes.   If your medication is too expensive, please contact our office at [phone] option 4 or send [place] a message through [place].   We are unable to tell what your co-pay for medications will be in advance as this is different depending on your insurance coverage. However, we may be able to find a substitute medication at lower cost or fill out paperwork to get insurance to cover a needed medication.   If a prior authorization is required to get your medication covered by your insurance company, please allow [place] 1-2 business days to complete this process.  Drug prices often vary depending on where the prescription is filled and some pharmacies may offer cheaper prices.  The website www.goodrx.com contains coupons for medications through different pharmacies. The prices here do not account for what the cost may be with help from insurance (it may be cheaper with your insurance), but the website can give you the price if you did not use any insurance.  - You can print the associated coupon and take it with your prescription to the pharmacy.  - You may also stop by our office during regular business hours and pick up a GoodRx coupon card.  - If you need your prescription sent electronically to a different pharmacy, notify our office through [facility] or by phone at [phone] option 4.     Si Usted Necesita Algo Despus de Su Visita  Tambin puede enviarnos un mensaje a travs de Pharmacist, community. Por lo general respondemos a los mensajes de MyChart en el transcurso de 1 a 2 das hbiles.  Para renovar recetas, por favor pida a su farmacia que se ponga en contacto con nuestra oficina. Harland Dingwall de fax es [place] [phone].  Si tiene un asunto urgente cuando la  clnica est cerrada y que no puede esperar hasta el siguiente da hbil, puede llamar/localizar a su doctor(a) al nmero que aparece a continuacin.   Por favor, tenga en cuenta que aunque hacemos todo lo posible para estar disponibles para asuntos urgentes fuera del horario de [place], no estamos disponibles las 24 horas del da, los 7 das de la [place].   Si tiene un problema urgente y no puede comunicarse con nosotros, puede optar por buscar atencin mdica  en el consultorio de su doctor(a), en una clnica privada, en un centro de atencin urgente o en una sala de emergencias.  Si tiene Engineering geologist, por favor llame inmediatamente al 911 o vaya a la sala de emergencias.  Nmeros de bper  - Dr. Nehemiah Massed: [phone]  - Dra. Moye: [phone]  - Dra. Nicole Kindred: [phone]  En caso de inclemencias del [place], por favor llame a Johnsie Kindred principal al [phone] para una actualizacin sobre el [place] de cualquier retraso o cierre.  Consejos para la medicacin en dermatologa: Por favor, guarde las cajas en las que vienen los medicamentos de uso tpico para ayudarle a seguir las instrucciones sobre dnde y cmo usarlos. Las farmacias generalmente imprimen las instrucciones del medicamento slo en las cajas y no directamente en los tubos del [place].   Si su medicamento es muy caro, por favor, pngase en contacto con Zigmund Daniel llamando al [phone] y presione la opcin 4 o envenos un mensaje a  travs de MyChart.   No podemos decirle cul ser su copago por los medicamentos por adelantado ya que esto es diferente dependiendo de la cobertura de su seguro. Sin embargo, es posible que podamos encontrar un medicamento sustituto a Electrical engineer un formulario para que el seguro cubra el medicamento que se considera necesario.   Si se requiere una autorizacin previa para que su compaa de seguros [place] su medicamento, por favor permtanos de 1 a 2 das hbiles  para completar este proceso.  Los precios de los medicamentos varan con frecuencia dependiendo del Environmental consultant de dnde se surte la receta y alguna farmacias pueden ofrecer precios ms baratos.  El sitio web www.goodrx.com tiene cupones para medicamentos de Airline pilot. Los precios aqu no tienen en cuenta lo que podra costar con la ayuda del seguro (puede ser ms barato con su seguro), pero el sitio web puede darle el precio si no utiliz Research scientist (physical sciences).  - Puede imprimir el cupn correspondiente y llevarlo con su receta a la farmacia.  - Tambin puede pasar por nuestra oficina durante el horario de atencin regular y Charity fundraiser una tarjeta de cupones de GoodRx.  - Si necesita que su receta se enve electrnicamente a una farmacia diferente, informe a nuestra oficina a travs de MyChart de  o por telfono llamando al [phone] y presione la opcin 4.

## 2021-11-01 ENCOUNTER — Encounter: Payer: Self-pay | Admitting: Dermatology

## 2021-11-02 ENCOUNTER — Telehealth: Payer: Self-pay

## 2021-11-02 NOTE — Telephone Encounter (Signed)
Discussed biopsy results with pt  °

## 2021-11-02 NOTE — Telephone Encounter (Signed)
-----   Message from Ralene Bathe, MD sent at 11/01/2021  6:04 PM EDT ----- Diagnosis Skin , left distal tricep SQUAMOUS CELL CARCINOMA IN SITU, HYPERTROPHIC  Cancer - SCCis Superficial Already treated Recheck next visit

## 2021-11-29 ENCOUNTER — Other Ambulatory Visit: Payer: Self-pay

## 2021-11-29 ENCOUNTER — Encounter: Payer: Self-pay | Admitting: Surgery

## 2021-11-29 ENCOUNTER — Ambulatory Visit: Payer: Medicare Other | Admitting: Surgery

## 2021-11-29 VITALS — BP 150/72 | HR 64 | Temp 97.8°F | Ht 63.0 in | Wt 122.6 lb

## 2021-11-29 DIAGNOSIS — K3532 Acute appendicitis with perforation and localized peritonitis, without abscess: Secondary | ICD-10-CM

## 2021-11-29 NOTE — Patient Instructions (Addendum)
CT scan scheduled on 12/08/21 @ 10:15 @ [facility]. Nothing to eat/drink 4 hours prior to the scan. Please enter through the Medical mall and check in.  Please go today to pick up your prep kit today.Please see your follow up appointment listed below.   Appendicitis, Adult  The appendix is a finger-shaped tube that is attached to the large intestine. Appendicitis is inflammation of the appendix. If appendicitis is not treated, it can cause the appendix to tear (rupture). A ruptured appendix can lead to a life-threatening infection. It can also cause a painful collection of pus (abscess) to form in the appendix. What are the causes? This condition may be caused by a blockage in the appendix that leads to infection. The blockage can be caused by: A ball of stool (fecal impaction). Enlarged lymph glands in the intestine. Injury (trauma) to the abdomen. In some cases, the cause may not be known. What increases the risk? This condition is more likely to develop in people who are 80-44 years old. What are the signs or symptoms? Symptoms of this condition include: Pain or tenderness that starts around the belly button and: Moves toward the lower right abdomen. Gets more severe as time passes. Gets worse with coughing or sudden movements. Nausea, vomiting, or loss of appetite. Fever. Constipation or diarrhea. Feeling general discomfort (malaise). How is this diagnosed? This condition may be diagnosed with: A physical exam. Blood tests. Urine test. To confirm the diagnosis, an ultrasound, MRI, or CT scan may be done. How is this treated? This condition can sometimes be treated with antibiotic medicine alone, but it is usually treated with both antibiotics and surgery to remove the appendix (appendectomy). If only antibiotics are given and the appendix is not removed, there is a chance that appendicitis could come back. There are two methods for doing an appendectomy: Open appendectomy. In this  surgery, the appendix is removed through a large incision that is made in the lower right abdomen. This procedure may be recommended if: You have major scarring from a previous surgery. You have a bleeding disorder. You are pregnant and are about to give birth. You have a condition that makes it hard to do surgery through small incisions (laparoscopic procedure). This includes severe infection or a ruptured appendix. Laparoscopic appendectomy. For this method, the appendix is removed through small incisions. This procedure usually causes less pain and fewer problems than an open appendectomy. It also has a shorter recovery time. If the appendix has ruptured and an abscess has formed, a tube (drain) may be placed into the abscess to remove fluid, and antibiotics may be given through an IV. The appendix may or may not need to be removed. Follow these instructions at home: If you had surgery: Follow instructions from your health care provider on how to: Care for yourself at home. Take care of your incision. Medicines Take over-the-counter and prescription medicines only as told by your health care provider. If you were prescribed an antibiotic medicine, take it as told by your health care provider. Do not stop using the antibiotic even if you start to feel better. Ask your health care provider if the medicine prescribed to you: Requires you to avoid driving or using machinery. Can cause constipation. You may need to take these actions to prevent or treat constipation: Drink enough fluid to keep your urine pale yellow. Take over-the-counter or prescription medicines. Eat foods that are high in fiber, such as beans, whole grains, and fresh fruits and vegetables. Limit  foods that are high in fat and processed sugars, such as fried or sweet foods. General instructions Follow instructions from your health care provider about eating or drinking restrictions. Do not use any products that contain  nicotine or tobacco. These products include cigarettes, chewing tobacco, and vaping devices, such as e-cigarettes. These can delay incision healing after surgery. If you need help quitting, ask your health care provider. Return to your normal activities as told by your health care provider. Ask your health care provider what activities are safe for you. Keep all follow-up visits. This is important. Contact a health care provider if: There is pus, blood, or excessive drainage coming from your incision. You have nausea or vomiting. You have a fever. You are feeling tired (fatigue) or muscle aches. Get help right away if: You have worsening abdominal pain that does not get better with medicine. You have shortness of breath. You cannot stop vomiting. These symptoms may represent a serious problem that is an emergency. Do not wait to see if the symptoms will go away. Get medical help right away. Call your local emergency services (911 in the [place] Do not drive yourself to the hospital. Summary Appendicitis is inflammation of the appendix. It can be caused by a blockage in the appendix that leads to infection. This condition is usually treated with both antibiotic medicines and surgery to remove the appendix (appendectomy). Symptoms include pain or tenderness that starts around your belly button and moves toward the lower right abdomen, nausea, vomiting, loss of appetite, fever, constipation or diarrhea, and malaise. To confirm the diagnosis, an ultrasound, MRI, or CT scan may be done. This information is not intended to replace advice given to you by your health care provider. Make sure you discuss any questions you have with your health care provider. Document Revised: 10/28/2020 Document Reviewed: 10/28/2020 Elsevier Patient Education  [employer].

## 2021-11-29 NOTE — Progress Notes (Signed)
Surgical Clinic Progress/Follow-up Note   HPI:  76 y.o. Female presents to clinic for appendiceal abscess follow-up 2 months follow the last evaluation.  She tolerated percutaneous drain placement and has had no problems since.  Patient reports resolution of prior issues and has been tolerating regular diet with +flatus and normal BM's, denies N/V, fever/chills, CP, or SOB.   Her percutaneous drain placement site has fully healed without further drainage.    Review of Systems:  Constitutional: denies fever/chills  ENT: denies sore throat, hearing problems  Respiratory: denies shortness of breath, wheezing  Cardiovascular: denies chest pain, palpitations  Gastrointestinal: denies abdominal pain, N/V, or diarrhea/and bowel function as per interval history Skin: Denies any other rashes or skin discolorations as per interval history  Vital Signs:  BP (!) 150/72   Pulse 64   Temp 97.8 F (36.6 C) (Oral)   Ht '5\' 3"'$  (1.6 m)   Wt 122 lb 9.6 oz (55.6 kg)   SpO2 97%   BMI 21.72 kg/m    Physical Exam:  Constitutional:  -- Normal body habitus  -- Awake, alert, and oriented x3  Pulmonary:  -- No crackles -- Equal breath sounds bilaterally -- Breathing non-labored at rest Cardiovascular:  -- S1, S2 present  -- No pericardial rubs  Gastrointestinal:  -- Soft and non-distended, non-tender/with no tenderness to palpation, no guarding/rebound tenderness -- No abdominal masses appreciated, pulsatile or otherwise    Musculoskeletal / Integumentary:  -- Wounds or skin discoloration: None appreciated except minimal erythema at drain site exit -- Extremities: B/L UE and LE FROM, hands and feet warm, no edema   Laboratory studies:  Culture and sensitivity reviewed: Sensitive E. coli and Streptococcus, no resistances noted.   Imaging: No new pertinent imaging available for review   Assessment:  76 y.o. yo Female with a problem list including...  Patient Active Problem List   Diagnosis  Date Noted   Chronic diastolic heart failure ([place]) 09/27/2021   Perforated appendicitis 08/31/2021   Carotid artery occlusion 02/07/2021   Cerebral atherosclerosis 02/07/2021   Hypertensive heart disease without congestive heart failure 02/07/2021   Occlusion and stenosis of bilateral carotid arteries 02/07/2021   Vitamin D deficiency, unspecified 02/07/2021   Chronic anticoagulation 01/27/2021   Senile purpura ([place]) 12/03/2020   Prediabetes 10/08/2019   Stenosis of right carotid artery 10/03/2018   Hyperlipidemia 10/03/2018   Essential hypertension 10/02/2018   CKD (chronic kidney disease) stage 3, GFR 30-59 ml/min (HCC) 06/17/2018   Chronic forearm pain (Primary Area of Pain) (Left) 06/17/2018   Neurogenic pain 06/17/2018   Chronic pain syndrome 06/06/2018   Long term current use of opiate analgesic 06/06/2018    presents to clinic for follow-up evaluation of appendiceal abscess status post successful percutaneous drainage, progressing well.  Plan:          Today we discussed the potential proceeding with elective appendectomy, however prior to proceeding with this we will repeat CT scan examination, consider colonoscopy prior to operative intervention. All of the above recommendations were discussed with the patient, and all of patient's questions were answered to her expressed satisfaction.  These notes generated with voice recognition software. I apologize for typographical errors.  Ronny Bacon, MD, FACS [place]: [facility] exceptional care. Office: [phone]

## 2021-12-08 ENCOUNTER — Ambulatory Visit: Payer: Medicare Other | Admitting: Surgery

## 2021-12-08 ENCOUNTER — Inpatient Hospital Stay: Admission: RE | Admit: 2021-12-08 | Payer: Medicare Other | Source: Ambulatory Visit

## 2021-12-12 ENCOUNTER — Ambulatory Visit (INDEPENDENT_AMBULATORY_CARE_PROVIDER_SITE_OTHER): Payer: Medicare Other | Admitting: Family Medicine

## 2021-12-12 ENCOUNTER — Encounter: Payer: Self-pay | Admitting: Family Medicine

## 2021-12-12 VITALS — BP 130/66 | HR 59 | Temp 97.6°F | Ht 63.0 in | Wt 120.1 lb

## 2021-12-12 DIAGNOSIS — I10 Essential (primary) hypertension: Secondary | ICD-10-CM

## 2021-12-12 DIAGNOSIS — I5032 Chronic diastolic (congestive) heart failure: Secondary | ICD-10-CM

## 2021-12-12 DIAGNOSIS — R7303 Prediabetes: Secondary | ICD-10-CM | POA: Diagnosis not present

## 2021-12-12 DIAGNOSIS — N1832 Chronic kidney disease, stage 3b: Secondary | ICD-10-CM | POA: Diagnosis not present

## 2021-12-12 NOTE — Assessment & Plan Note (Signed)
Well controlled since addition of amlodipine. No changes today. Obtain BMP. F/u in 6 months.

## 2021-12-12 NOTE — Progress Notes (Signed)
    I,Dawn Bruce,acting as a Education administrator for [employer], DO.,have documented all relevant documentation on the behalf of Dawn Gip, DO,as directed by  Dawn Gip, DO while in the presence of Dawn Gip, DO.   SUBJECTIVE:   CHIEF COMPLAINT / HPI:   Hypertension: - Medications: valsartan, amlodipine (restarted last visit), coreg - Compliance: taking as prescribed - Checking BP at home: on occasion, 120-130s SBP. Highest 140 SBP, not often.  - Denies any SOB, vision changes, medication SEs, or symptoms of hypotension - Diet: general - Exercise: walking daily for 20 minutes -Patient reports chest tightness, minor swelling in feet and ankles at times  Prediabetes - Last A1c 6.5 08/2021  - Medications: jardiance - Compliance:  taking as prescribed - Checking BG at home: no - Diet: general - Exercise: walking daily for 20 minutes - Denies symptoms of hypoglycemia, polyuria, polydipsia, numbness extremities, foot ulcers/trauma   OBJECTIVE:   BP 130/66 (BP Location: Right Arm, Patient Position: Sitting, Cuff Size: Normal)   Pulse (!) 59   Temp 97.6 F (36.4 C) (Oral)   Ht '5\' 3"'$  (1.6 m)   Wt 120 lb 1.6 oz (54.5 kg)   SpO2 100%   BMI 21.27 kg/m   Gen: well appearing, in NAD Card: RRR Lungs: CTAB Ext: WWP, no edema   ASSESSMENT/PLAN:   Essential hypertension Well controlled since addition of amlodipine. No changes today. Obtain BMP. F/u in 6 months.  Prediabetes Recheck A1c. Continue jardiance.     Dawn Gip, DO

## 2021-12-12 NOTE — Patient Instructions (Signed)
It was great to see you!  Our plans for today:  - No changes to your medications.  We are checking some labs today, we will release these results to your MyChart.  Take care and seek immediate care sooner if you develop any concerns.   Dr. Ky Barban

## 2021-12-12 NOTE — Assessment & Plan Note (Signed)
Recheck A1c. Continue jardiance.

## 2021-12-13 ENCOUNTER — Ambulatory Visit
Admission: RE | Admit: 2021-12-13 | Discharge: 2021-12-13 | Disposition: A | Discharge status: Home / Self Care | Payer: Medicare Other | Source: Ambulatory Visit | Attending: Surgery | Admitting: Surgery

## 2021-12-13 ENCOUNTER — Telehealth: Payer: Self-pay

## 2021-12-13 ENCOUNTER — Encounter: Payer: Self-pay | Admitting: Family Medicine

## 2021-12-13 DIAGNOSIS — N200 Calculus of kidney: Secondary | ICD-10-CM | POA: Diagnosis not present

## 2021-12-13 DIAGNOSIS — K3532 Acute appendicitis with perforation and localized peritonitis, without abscess: Secondary | ICD-10-CM | POA: Diagnosis not present

## 2021-12-13 LAB — BASIC METABOLIC PANEL
BUN/Creatinine Ratio: 19 (ref 12–28)
BUN: 34 mg/dL — ABNORMAL HIGH (ref 8–27)
CO2: 21 mmol/L (ref 20–29)
Calcium: 9.1 mg/dL (ref 8.7–10.3)
Chloride: 108 mmol/L — ABNORMAL HIGH (ref 96–106)
Creatinine, Ser: 1.77 mg/dL — ABNORMAL HIGH (ref 0.57–1.00)
Glucose: 137 mg/dL — ABNORMAL HIGH (ref 70–99)
Potassium: 4.6 mmol/L (ref 3.5–5.2)
Sodium: 147 mmol/L — ABNORMAL HIGH (ref 134–144)
eGFR: 29 mL/min/{1.73_m2} — ABNORMAL LOW (ref >59)

## 2021-12-13 LAB — HEMOGLOBIN A1C
Est. average glucose Bld gHb Est-mCnc: 111 mg/dL
Hgb A1c MFr Bld: 5.5 % (ref 4.8–5.6)

## 2021-12-13 NOTE — Telephone Encounter (Signed)
Larene Beach from Outpatient imaging called and states that the patient's labs are abnormal and they will not be able to do an IV contrast study. They are able to do oral only.

## 2021-12-17 ENCOUNTER — Other Ambulatory Visit: Payer: Self-pay | Admitting: Family Medicine

## 2021-12-17 DIAGNOSIS — M792 Neuralgia and neuritis, unspecified: Secondary | ICD-10-CM

## 2021-12-19 MED ORDER — FENTANYL 50 MCG/HR TD PT72: 1.0000 | MEDICATED_PATCH | TRANSDERMAL | 0 refills | Status: DC

## 2021-12-22 ENCOUNTER — Ambulatory Visit: Payer: Medicare Other | Admitting: Gastroenterology

## 2021-12-27 ENCOUNTER — Ambulatory Visit: Payer: Medicare Other | Admitting: Surgery

## 2021-12-27 ENCOUNTER — Encounter: Payer: Self-pay | Admitting: Surgery

## 2021-12-27 VITALS — BP 139/73 | HR 60 | Temp 97.6°F | Wt 120.6 lb

## 2021-12-27 DIAGNOSIS — K3532 Acute appendicitis with perforation and localized peritonitis, without abscess: Secondary | ICD-10-CM | POA: Diagnosis not present

## 2021-12-27 NOTE — Progress Notes (Signed)
Request for Cardiology and anticoagulation clearance has been faxed to Ezzard Flax, NP at [facility].

## 2021-12-27 NOTE — Patient Instructions (Addendum)
We recommend having your appendix removed. This can be done anytime at your convenience. We will see you in office prior to surgery.   We will request Cardiology clearance for this surgery. You will need to be off of your Eliquis for 2 full days prior to surgery.   We have scheduled you for an elective Appendectomy. Please see the following information regarding your surgery.   Typically, our patients are out of work 1-2 weeks following the surgery and may return with a lifting restriction of no more than 15 lbs. For a complete 6 weeks following surgery.  If you have FMLA or Disability paperwork that needs to be filled out, please have your company fax your paperwork to [phone] or you may drop this by either office. This paperwork will be filled out within 3 days after your surgery has been completed.  Also, please review the [facility]) Pre-Care sheet for further information regarding your surgery. Our surgery schedule will call you to verify surgery date and to go over information.  If you have any questions, please do not hesitate to contact our office.  Laparoscopic Appendectomy, Adult, Care After Refer to this sheet in the next few weeks. These instructions provide you with information about caring for yourself after your procedure. Your health care provider may also give you more specific instructions. Your treatment has been planned according to current medical practices, but problems sometimes occur. Call your health care provider if you have any problems or questions after your procedure. What can I expect after the procedure? After the procedure, it is common to have: A decrease in your energy level. Mild pain in the area where the surgical cuts (incisions) were made. Constipation. This can be caused by pain medicine and a decrease in your activity.  Follow these instructions at home: Medicines Take over-the-counter and prescription medicines only as told by your health care  provider. Do not drive for 24 hours if you received a sedative. Do not drive or operate heavy machinery while taking prescription pain medicine. If you were prescribed an antibiotic medicine, take it as told by your health care provider. Do not stop taking the antibiotic even if you start to feel better. Activity For 3 weeks or as long as told by your health care provider: Do not lift anything that is heavier than 10 pounds (4.5 kg). Do not play contact sports. Gradually return to your normal activities. Ask your health care provider what activities are safe for you. Bathing Keep your incisions clean and dry. Clean them as often as told by your health care provider: Gently wash the incisions with soap and water. Rinse the incisions with water to remove all soap. Pat the incisions dry with a clean towel. Do not rub the incisions. You may take showers after 48 hours. Do not take baths, swim, or use hot tubs for 2 weeks or as told by your health care provider. Incision care Follow instructions from your healthcare provider about how to take care of your incisions. Make sure you: Wash your hands with soap and water before you change your bandage (dressing). If soap and water are not available, use hand sanitizer. Change your dressing as told by your health care provider. Leave stitches (sutures), skin glue, or adhesive strips in place. These skin closures may need to stay in place for 2 weeks or longer. If adhesive strip edges start to loosen and curl up, you may trim the loose edges. Do not remove adhesive strips completely  unless your health care provider tells you to do that. Check your incision areas every day for signs of infection. Check for: More redness, swelling, or pain. More fluid or blood. Warmth. Pus or a bad smell. Other Instructions If you were sent home with a drain, follow instructions from your health care provider about how to care for the drain and how to empty it. Take  deep breaths. This helps to prevent your lungs from becoming inflamed. To relieve and prevent constipation: Drink plenty of fluids. Eat plenty of fruits and vegetables. Keep all follow-up visits as told by your health care provider. This is important. Contact a health care provider if: You have more redness, swelling, or pain around an incision. You have more fluid or blood coming from an incision. Your incision feels warm to the touch. You have pus or a bad smell coming from an incision or dressing. Your incision edges break open after your sutures have been removed. You have increasing pain in your shoulders. You feel dizzy or you faint. You develop shortness of breath. You keep feeling nauseous or vomiting. You have diarrhea or you cannot control your bowel functions. You lose your appetite. You develop swelling or pain in your legs. Get help right away if: You have a fever. You develop a rash. You have difficulty breathing. You have sharp pains in your chest. This information is not intended to replace advice given to you by your health care provider. Make sure you discuss any questions you have with your health care provider. Document Released: 05/08/2005 Document Revised: 10/08/2015 Document Reviewed: 10/26/2014 Elsevier Interactive Patient Education  2017 [employer].

## 2021-12-27 NOTE — Progress Notes (Signed)
Surgical Clinic Progress/Follow-up Note   HPI:  76 y.o. Female presents to clinic for appendiceal abscess follow-up.  She tolerated percutaneous drain placement and has had no problems since.  Patient reports resolution of prior issues and has been tolerating regular diet with +flatus and normal BM's, denies N/V, fever/chills, CP, or SOB.   Follow-up CT scan confirms the changes consistent with prior appendiceal/pericecal inflammation.  Without evidence of active inflammation.  Review of Systems:  Constitutional: denies fever/chills  ENT: denies sore throat, hearing problems  Respiratory: denies shortness of breath, wheezing  Cardiovascular: denies chest pain, palpitations  Gastrointestinal: denies abdominal pain, N/V, or diarrhea/and bowel function as per interval history Skin: Denies any other rashes or skin discolorations as per interval history  Vital Signs:  BP 139/73   Pulse 60   Temp 97.6 F (36.4 C) (Oral)   Wt 120 lb 9.6 oz (54.7 kg)   SpO2 96%   BMI 21.36 kg/m    Physical Exam:  Constitutional:  -- Normal body habitus  -- Awake, alert, and oriented x3  Pulmonary:  -- No crackles -- Equal breath sounds bilaterally -- Breathing non-labored at rest Cardiovascular:  -- S1, S2 present  -- No pericardial rubs  Gastrointestinal:  -- Soft and non-distended, non-tender/with no tenderness to palpation, no guarding/rebound tenderness -- No abdominal masses appreciated, pulsatile or otherwise  Musculoskeletal / Integumentary:  -- Wounds or skin discoloration: None appreciated except minimal erythema at drain site exit -- Extremities: B/L UE and LE FROM, hands and feet warm, no edema   Laboratory studies:  Culture and sensitivity reviewed: Sensitive E. coli and Streptococcus, no resistances noted.   Imaging:  CLINICAL DATA:  Appendicitis post drain placement   EXAM: CT ABDOMEN AND PELVIS WITHOUT CONTRAST   TECHNIQUE: Multidetector CT imaging of the abdomen and pelvis  was performed following the standard protocol without IV contrast. Patient drank dilute oral contrast for exam.   RADIATION DOSE REDUCTION: This exam was performed according to the departmental dose-optimization program which includes automated exposure control, adjustment of the mA and/or kV according to patient size and/or use of iterative reconstruction technique.   COMPARISON:  08/31/2021   FINDINGS: Lower chest: LEFT pleural effusion with compressive atelectasis in LEFT lower lobe. RIGHT lung base clear.   Hepatobiliary: Gallbladder and liver normal appearance   Pancreas: Normal appearance   Spleen: Normal appearance   Adrenals/Urinary Tract: Adrenal glands normal appearance. Tiny nonobstructing LEFT renal calculus. Kidneys, ureters, and bladder unremarkable. No renal mass or hydronephrosis.   Stomach/Bowel: Thickening of cecal tip and adjacent coiled appendix markedly improved since previous exam. Resolution of periappendiceal abscess since prior study. Small hiatal hernia. Sigmoid diverticulosis. Stomach and bowel loops otherwise normal appearance.   Vascular/Lymphatic: Atherosclerotic calcifications aorta and iliac arteries. Single minimally enlarged mesenteric lymph node RIGHT mid abdomen medial to RIGHT colon, little changed, likely reactive. Scattered pelvic phleboliths.   Reproductive: Uterus surgically absent. Nonvisualization of ovaries.   Other: No free air or free fluid.   Musculoskeletal: Osseous demineralization with degenerative disc disease changes lumbar spine.   IMPRESSION: Resolution of periappendiceal abscess collection.   Residual thickening of coiled appendix and cecal tip.   LEFT pleural effusion and minimal minimal compressive atelectasis in LEFT lower lobe, unchanged.   Sigmoid diverticulosis without evidence of diverticulitis.   Tiny nonobstructing LEFT renal calculus.   Small hiatal hernia.   Aortic Atherosclerosis  (ICD10-I70.0).     Electronically Signed   By: Lavonia Dana M.D.   On: 12/13/2021  18:43  Assessment:  76 y.o. yo Female with a problem list including...  Patient Active Problem List   Diagnosis Date Noted   Chronic diastolic heart failure ([place]) 09/27/2021   Perforated appendicitis 08/31/2021   Carotid artery occlusion 02/07/2021   Cerebral atherosclerosis 02/07/2021   Hypertensive heart disease without congestive heart failure 02/07/2021   Occlusion and stenosis of bilateral carotid arteries 02/07/2021   Vitamin D deficiency, unspecified 02/07/2021   Chronic anticoagulation 01/27/2021   Senile purpura ([place]) 12/03/2020   Prediabetes 10/08/2019   Stenosis of right carotid artery 10/03/2018   Hyperlipidemia 10/03/2018   Essential hypertension 10/02/2018   CKD (chronic kidney disease) stage 3, GFR 30-59 ml/min (HCC) 06/17/2018   Chronic forearm pain (Primary Area of Pain) (Left) 06/17/2018   Neurogenic pain 06/17/2018   Chronic pain syndrome 06/06/2018   Long term current use of opiate analgesic 06/06/2018    presents to clinic for follow-up evaluation of appendiceal abscess status post successful percutaneous drainage, progressing well.  Plan:          Today we discussed the potential proceeding with elective appendectomy, however prior to proceeding with this we had referred her to GI for colonoscopy prior to operative intervention.  However she had canceled her appointment with Dr. Marius Ditch. She currently has plans that would defer her desired date for surgery until late September. All of the above recommendations were discussed with the patient, and all of patient's questions were answered to her expressed satisfaction.  These notes generated with voice recognition software. I apologize for typographical errors.  Ronny Bacon, MD, FACS [place]: [facility] exceptional care. Office: [phone]

## 2022-01-02 DIAGNOSIS — I4819 Other persistent atrial fibrillation: Secondary | ICD-10-CM | POA: Diagnosis not present

## 2022-01-02 DIAGNOSIS — I5032 Chronic diastolic (congestive) heart failure: Secondary | ICD-10-CM | POA: Diagnosis not present

## 2022-01-02 DIAGNOSIS — R42 Dizziness and giddiness: Secondary | ICD-10-CM | POA: Diagnosis not present

## 2022-01-02 DIAGNOSIS — R0789 Other chest pain: Secondary | ICD-10-CM | POA: Diagnosis not present

## 2022-01-03 ENCOUNTER — Telehealth: Payer: Self-pay | Admitting: Surgery

## 2022-01-03 NOTE — Progress Notes (Signed)
Cardiology unable to clear patient at this time. She has been scheduled for a Nuclear stress test on 01/17/22.

## 2022-01-03 NOTE — Telephone Encounter (Signed)
Patient has been advised of Pre-Admission date/time, and Surgery date.  Surgery Date: 02/15/22 @ [facility] Preadmission Testing Date: 02/07/22 (phone 8a-1p)  Patient has been made aware to call [phone], between 1-3:00pm the day before surgery, to find out what time to arrive for surgery.

## 2022-01-16 DIAGNOSIS — J90 Pleural effusion, not elsewhere classified: Secondary | ICD-10-CM | POA: Diagnosis not present

## 2022-01-16 DIAGNOSIS — I10 Essential (primary) hypertension: Secondary | ICD-10-CM | POA: Diagnosis not present

## 2022-01-16 DIAGNOSIS — I5032 Chronic diastolic (congestive) heart failure: Secondary | ICD-10-CM | POA: Diagnosis not present

## 2022-01-16 DIAGNOSIS — N1831 Chronic kidney disease, stage 3a: Secondary | ICD-10-CM | POA: Diagnosis not present

## 2022-01-16 DIAGNOSIS — R0600 Dyspnea, unspecified: Secondary | ICD-10-CM | POA: Diagnosis not present

## 2022-01-16 DIAGNOSIS — J948 Other specified pleural conditions: Secondary | ICD-10-CM | POA: Diagnosis not present

## 2022-01-18 DIAGNOSIS — R0789 Other chest pain: Secondary | ICD-10-CM | POA: Diagnosis not present

## 2022-01-19 DIAGNOSIS — N179 Acute kidney failure, unspecified: Secondary | ICD-10-CM | POA: Diagnosis not present

## 2022-01-19 DIAGNOSIS — I5032 Chronic diastolic (congestive) heart failure: Secondary | ICD-10-CM | POA: Diagnosis not present

## 2022-01-19 DIAGNOSIS — N184 Chronic kidney disease, stage 4 (severe): Secondary | ICD-10-CM | POA: Diagnosis not present

## 2022-01-19 DIAGNOSIS — N1832 Chronic kidney disease, stage 3b: Secondary | ICD-10-CM | POA: Diagnosis not present

## 2022-01-20 ENCOUNTER — Emergency Department
Admission: EM | Admit: 2022-01-20 | Discharge: 2022-01-20 | Disposition: A | Discharge status: Home / Self Care | Payer: Medicare Other | Attending: Emergency Medicine | Admitting: Emergency Medicine

## 2022-01-20 ENCOUNTER — Other Ambulatory Visit: Payer: Self-pay

## 2022-01-20 DIAGNOSIS — N179 Acute kidney failure, unspecified: Secondary | ICD-10-CM | POA: Diagnosis not present

## 2022-01-20 DIAGNOSIS — N189 Chronic kidney disease, unspecified: Secondary | ICD-10-CM | POA: Diagnosis not present

## 2022-01-20 DIAGNOSIS — E861 Hypovolemia: Secondary | ICD-10-CM | POA: Insufficient documentation

## 2022-01-20 DIAGNOSIS — R799 Abnormal finding of blood chemistry, unspecified: Secondary | ICD-10-CM | POA: Diagnosis present

## 2022-01-20 HISTORY — DX: Disorder of kidney and ureter, unspecified: N28.9

## 2022-01-20 LAB — URINALYSIS, ROUTINE W REFLEX MICROSCOPIC
Bilirubin Urine: NEGATIVE
Glucose, UA: 150 mg/dL — AB
Ketones, ur: NEGATIVE mg/dL
Leukocytes,Ua: NEGATIVE
Nitrite: NEGATIVE
Protein, ur: NEGATIVE mg/dL
Specific Gravity, Urine: 1.003 — ABNORMAL LOW (ref 1.005–1.030)
Squamous Epithelial / HPF: NONE SEEN (ref 0–5)
pH: 6 (ref 5.0–8.0)

## 2022-01-20 LAB — CBC WITH DIFFERENTIAL/PLATELET
Abs Immature Granulocytes: 0.01 10*3/uL (ref 0.00–0.07)
Basophils Absolute: 0 10*3/uL (ref 0.0–0.1)
Basophils Relative: 1 %
Eosinophils Absolute: 0.2 10*3/uL (ref 0.0–0.5)
Eosinophils Relative: 4 %
HCT: 47.5 % — ABNORMAL HIGH (ref 36.0–46.0)
Hemoglobin: 15.2 g/dL — ABNORMAL HIGH (ref 12.0–15.0)
Immature Granulocytes: 0 %
Lymphocytes Relative: 18 %
Lymphs Abs: 0.9 10*3/uL (ref 0.7–4.0)
MCH: 28.7 pg (ref 26.0–34.0)
MCHC: 32 g/dL (ref 30.0–36.0)
MCV: 89.6 fL (ref 80.0–100.0)
Monocytes Absolute: 0.4 10*3/uL (ref 0.1–1.0)
Monocytes Relative: 9 %
Neutro Abs: 3.2 10*3/uL (ref 1.7–7.7)
Neutrophils Relative %: 68 %
Platelets: 105 10*3/uL — ABNORMAL LOW (ref 150–400)
RBC: 5.3 MIL/uL — ABNORMAL HIGH (ref 3.87–5.11)
RDW: 14.2 % (ref 11.5–15.5)
WBC: 4.7 10*3/uL (ref 4.0–10.5)
nRBC: 0 % (ref 0.0–0.2)

## 2022-01-20 LAB — BASIC METABOLIC PANEL
Anion gap: 4 — ABNORMAL LOW (ref 5–15)
BUN: 33 mg/dL — ABNORMAL HIGH (ref 8–23)
CO2: 24 mmol/L (ref 22–32)
Calcium: 8.7 mg/dL — ABNORMAL LOW (ref 8.9–10.3)
Chloride: 113 mmol/L — ABNORMAL HIGH (ref 98–111)
Creatinine, Ser: 1.47 mg/dL — ABNORMAL HIGH (ref 0.44–1.00)
GFR, Estimated: 37 mL/min — ABNORMAL LOW (ref >60)
Glucose, Bld: 107 mg/dL — ABNORMAL HIGH (ref 70–99)
Potassium: 4.5 mmol/L (ref 3.5–5.1)
Sodium: 141 mmol/L (ref 135–145)

## 2022-01-20 MED ORDER — SODIUM CHLORIDE 0.9 % IV BOLUS
1000.0000 mL | Freq: Once | INTRAVENOUS | Status: AC
Start: 2022-01-20 — End: 2022-01-20
  Administered 2022-01-20: 1000 mL via INTRAVENOUS

## 2022-01-20 NOTE — Discharge Instructions (Signed)
Follow-up with Dr. Alfonzo Feller and with your cardiologist as instructed.  Follow their instructions on your medications.  Make sure to stay hydrated.  Return to the ER for new, worsening, or persistent weakness, lightheadedness, feeling dehydrated, not making urine, or any other new or worsening symptoms that concern you.

## 2022-01-20 NOTE — ED Notes (Signed)
Dr Cherylann Banas placed orders- blood work to be done after fluids

## 2022-01-20 NOTE — ED Notes (Signed)
Pt assisted to restroom.  

## 2022-01-20 NOTE — ED Provider Notes (Signed)
[facility] Provider Note    Event Date/Time   First MD Initiated Contact with Patient 01/20/22 1201     (approximate)   History   Dehydration   HPI  Dawn Bruce is a 76 y.o. female with a history of chronic kidney disease who presents due to elevated creatinine found on labs yesterday.  The patient followed up with a new nephrologist, Dr. Alfonzo Feller, at [facility].  She was found to have a creatinine of 3.4 and was told to come to the ED for hydration.  The patient states she feels fine and denies any acute complaints.  She states she has been trying to hydrate at home with water.      Physical Exam   Triage Vital Signs: ED Triage Vitals  Enc Vitals Group     BP 01/20/22 0936 (!) 192/76     Pulse Rate 01/20/22 0936 61     Resp 01/20/22 0936 18     Temp 01/20/22 0936 97.6 F (36.4 C)     Temp Source 01/20/22 0936 Oral     SpO2 01/20/22 0936 100 %     Weight 01/20/22 0936 119 lb (54 kg)     Height 01/20/22 0936 '5\' 3"'$  (1.6 m)     Head Circumference --      Peak Flow --      Pain Score 01/20/22 0935 0     Pain Loc --      Pain Edu? --      Excl. in [place]? --     Most recent vital signs: Vitals:   01/20/22 1400 01/20/22 1430  BP: (!) 177/78 (!) 154/69  Pulse: 64 65  Resp: 18 16  Temp:    SpO2: 100% 100%     General: Awake, no distress.  CV:  Good peripheral perfusion.  Resp:  Normal effort.  Abd:  No distention.  Other:  No peripheral edema.   ED Results / Procedures / Treatments   Labs (all labs ordered are listed, but only abnormal results are displayed) Labs Reviewed  BASIC METABOLIC PANEL - Abnormal; Notable for the following components:      Result Value   Chloride 113 (*)    Glucose, Bld 107 (*)    BUN 33 (*)    Creatinine, Ser 1.47 (*)    Calcium 8.7 (*)    GFR, Estimated 37 (*)    Anion gap 4 (*)    All other components within normal limits  CBC WITH DIFFERENTIAL/PLATELET - Abnormal; Notable for the following components:    RBC 5.30 (*)    Hemoglobin 15.2 (*)    HCT 47.5 (*)    Platelets 105 (*)    All other components within normal limits  URINALYSIS, ROUTINE W REFLEX MICROSCOPIC - Abnormal; Notable for the following components:   Color, Urine COLORLESS (*)    APPearance CLEAR (*)    Specific Gravity, Urine 1.003 (*)    Glucose, UA 150 (*)    Hgb urine dipstick MODERATE (*)    Bacteria, UA RARE (*)    All other components within normal limits     EKG     RADIOLOGY   PROCEDURES:  Critical Care performed: No  Procedures   MEDICATIONS ORDERED IN ED: Medications  sodium chloride 0.9 % bolus 1,000 mL (0 mLs Intravenous Stopped 01/20/22 1422)     IMPRESSION / MDM / ASSESSMENT AND PLAN / ED COURSE  I reviewed the triage vital signs and the nursing  notes.  76 year old female with PMH as noted above presents referred for AKI found on labs yesterday.  She denies any acute complaints at this time.  I reviewed the past medical records, including the note from Dr. Alfonzo Feller from [facility] nephrology from yesterday.  He diagnosed the patient with CKD stage IV and felt that her acutely worsened creatinine was due to hypovolemia.  He recommended she come into the ED:  I believe her AKI is hemodynamic and volume. As outlined below she was contacted this morning-September 1 and instructed to present to the emergency room. I offered her evaluation at [facility] but she is a good distance from [place] and will go to [facility] in [place]. She is given my pager number and is instructed to have the providers page me when she presents. Based on her hemoconcentration yesterday as well, I think she almost certainly is volume depleted and will respond to some IV fluids. I think a more difficult question is going to be management of her diuretics/SGLT2 and the balance between her CKD and HFpEF.  Differential diagnosis includes, but is not limited to, dehydration/hypovolemia, AKI related to another cause, other metabolic  disturbance.  Patient's presentation is most consistent with acute presentation with potential threat to life or bodily function.  We will give a liter of normal saline and check labs.  ----------------------------------------- 2:57 PM on 01/20/2022 -----------------------------------------  The patient was given a liter of NS.  She continues to feel well.  Creatinine now is 1.47 with a BUN of 33.  Hemoglobin has improved as well to 15.  Urinalysis shows no concerning acute findings.  The electrolytes are otherwise unremarkable.  I contacted Dr. Alfonzo Feller and discussed the results with him.  He agrees with discharge home with follow-up with him and her regular doctors.  I counseled the patient on the results of the work-up.  She feels well and is eager to go home.  Return precautions given, and she expresses understanding.   FINAL CLINICAL IMPRESSION(S) / ED DIAGNOSES   Final diagnoses:  AKI (acute kidney injury) ([place])  Hypovolemia     Rx / DC Orders   ED Discharge Orders     None        Note:  This document was prepared using Dragon voice recognition software and may include unintentional dictation errors.    Arta Silence, MD 01/20/22 1537

## 2022-01-20 NOTE — ED Notes (Signed)
Assisted to restroom

## 2022-01-20 NOTE — ED Triage Notes (Addendum)
Pt states she was seen by her nephrologist yesterday and had blood work done and was sent here to get fluids- per note from [facility] nephrologist they suggested giving a liter of fluid and then rechecking her bloodwork

## 2022-01-24 NOTE — Progress Notes (Signed)
Cardiology unable to clear patient for surgery at this time. She will be scheduled for a heart cath procedure in the near future for further work up.

## 2022-01-26 ENCOUNTER — Telehealth: Payer: Self-pay | Admitting: Surgery

## 2022-01-26 NOTE — Telephone Encounter (Signed)
Patient calls to cancel her surgery for appendectomy on 02/15/22.  She states that at this time her cardiologist not recommending. She is pending heart cath.  Patient wished well and will call us back when ready.

## 2022-02-02 ENCOUNTER — Ambulatory Visit: Payer: Medicare Other | Admitting: Surgery

## 2022-02-07 ENCOUNTER — Other Ambulatory Visit: Payer: Medicare Other

## 2022-02-13 ENCOUNTER — Telehealth: Payer: Self-pay | Admitting: Family Medicine

## 2022-02-13 NOTE — Telephone Encounter (Signed)
[facility] faxed refill request for the following medications:  atorvastatin (LIPITOR) 40 MG tablet   Please advise.

## 2022-02-14 ENCOUNTER — Ambulatory Visit: Payer: Self-pay

## 2022-02-14 NOTE — Telephone Encounter (Signed)
Summary: Covid positive and has questions/concerns about medication and what she should be doing   Pt reports that she tested positive for Covid and she would like to know what she should be doing and if there is any medication that she should be taking. Cb# [phone]      Chief Complaint: COVID. positive.Cough, sore throat. Asking for medication, no availability. Symptoms: Above Frequency: 2 days ago Pertinent Negatives: Patient denies fever Disposition: '[]'$ ED /'[]'$ Urgent Care (no appt availability in office) / '[]'$ Appointment(In office/virtual)/ '[]'$  [facility]/ '[]'$ Home Care/ '[]'$ Refused Recommended Disposition /'[]'$ [place] [facility]/ '[x]'$  Follow-up with PCP Additional Notes: Please advise pt.  Answer Assessment - Initial Assessment Questions 1. COVID-19 DIAGNOSIS: "How do you know that you have COVID?" (e.g., positive lab test or self-test, diagnosed by doctor or NP/PA, symptoms after exposure).     hOME  2. COVID-19 EXPOSURE: "Was there any known exposure to COVID before the symptoms began?" CDC Definition of close contact: within 6 feet (2 meters) for a total of 15 minutes or more over a 24-hour period.      No 3. ONSET: "When did the COVID-19 symptoms start?"      Yesterday 4. WORST SYMPTOM: "What is your worst symptom?" (e.g., cough, fever, shortness of breath, muscle aches)     Cough, sore throat 5. COUGH: "Do you have a cough?" If Yes, ask: "How bad is the cough?"       Yes 6. FEVER: "Do you have a fever?" If Yes, ask: "What is your temperature, how was it measured, and when did it start?"     No 7. RESPIRATORY STATUS: "Describe your breathing?" (e.g., normal; shortness of breath, wheezing, unable to speak)      no 8. BETTER-SAME-WORSE: "Are you getting better, staying the same or getting worse compared to yesterday?"  If getting worse, ask, "In what way?"     Better 9. OTHER SYMPTOMS: "Do you have any other symptoms?"  (e.g., chills, fatigue, headache, loss of smell or  taste, muscle pain, sore throat)     No 10. HIGH RISK DISEASE: "Do you have any chronic medical problems?" (e.g., asthma, heart or lung disease, weak immune system, obesity, etc.)       Heart 11. VACCINE: "Have you had the COVID-19 vaccine?" If Yes, ask: "Which one, how many shots, when did you get it?"       N/a 12. PREGNANCY: "Is there any chance you are pregnant?" "When was your last menstrual period?"       No 13. O2 SATURATION MONITOR:  "Do you use an oxygen saturation monitor (pulse oximeter) at home?" If Yes, ask "What is your reading (oxygen level) today?" "What is your usual oxygen saturation reading?" (e.g., 95%)       No  Protocols used: Coronavirus (COVID-19) Diagnosed or Suspected-A-AH

## 2022-02-15 ENCOUNTER — Encounter: Payer: Self-pay | Admitting: Family Medicine

## 2022-02-15 ENCOUNTER — Ambulatory Visit: Admit: 2022-02-15 | Payer: Medicare Other | Admitting: Surgery

## 2022-02-15 ENCOUNTER — Telehealth (INDEPENDENT_AMBULATORY_CARE_PROVIDER_SITE_OTHER): Payer: Medicare Other | Admitting: Family Medicine

## 2022-02-15 DIAGNOSIS — U071 COVID-19: Secondary | ICD-10-CM | POA: Diagnosis not present

## 2022-02-15 SURGERY — APPENDECTOMY, ROBOT-ASSISTED, LAPAROSCOPIC
Anesthesia: General

## 2022-02-15 MED ORDER — NIRMATRELVIR/RITONAVIR (PAXLOVID) TABLET (RENAL DOSING)
2.0000 | ORAL_TABLET | Freq: Two times a day (BID) | ORAL | 0 refills | Status: AC
Start: 2022-02-15 — End: 2022-02-20

## 2022-02-15 NOTE — Progress Notes (Signed)
Virtual Visit via Video Note  I connected with Dawn Bruce on 02/15/22 at  9:00 AM EDT by a video enabled telemedicine application and verified that I am speaking with the correct person using two identifiers.  Location: Patient: home Provider: [facility]   I discussed the limitations of evaluation and management by telemedicine and the availability of in person appointments. The patient expressed understanding and agreed to proceed.  History of Present Illness:  UPPER RESPIRATORY TRACT INFECTION - symptom onset 4 days ago - COVID +  Fever: no Cough:  minimal Shortness of breath: no Chest pain: no Chest congestion: yes Nasal congestion: yes Sore throat: resolved Sinus pressure: no Headache: no Ear pain: no  Ear pressure: no  Vomiting: no Sick contacts: yes Context: better Relief with OTC cold/cough medications: yes  Treatments attempted: corcidin     Observations/Objective:  Well appearing, in NAD. Speaks in full sentences. Comfortable WOB on RA. No resp distress.    Assessment and Plan:  [id number] Doing well with mild sx. Is a candidate for COVID treatment, rx renally-dosed Paxlovid. Reviewed OTC symptom relief, self-quarantine guidelines, and emergency precautions.      I discussed the assessment and treatment plan with the patient. The patient was provided an opportunity to ask questions and all were answered. The patient agreed with the plan and demonstrated an understanding of the instructions.   The patient was advised to call back or seek an in-person evaluation if the symptoms worsen or if the condition fails to improve as anticipated.  I provided 7 minutes of non-face-to-face time during this encounter.   Myles Gip, DO

## 2022-02-15 NOTE — Patient Instructions (Signed)
It was great to see you!  Our plans for today:  - See below for self-isolation guidelines. You may end your quarantine once you are 10 days from symptom onset and fever free for 24 hours without use of tylenol or ibuprofen. Wear a well-fitting N95 mask if you have to go out and about. - Take the paxlovid as directed. See HotterNames.de for more information about the medication. - I recommend getting vaccinated with your booster once you are healed from your current infection. - Certainly, if you are having difficulties breathing or unable to keep down fluids, go to the Emergency Department.   Take care and seek immediate care sooner if you develop any concerns.   Dr. Ky Barban     Person Under Monitoring Name: Dawn Bruce  Location: [place] [address]   Infection Prevention Recommendations for Individuals Confirmed to have, or Being Evaluated for, 2019 Novel Coronavirus (COVID-19) Infection Who Receive Care at Home  Individuals who are confirmed to have, or are being evaluated for, COVID-19 should follow the prevention steps below until a healthcare provider or local or state health department says they can return to normal activities.  Stay home except to get medical care You should restrict activities outside your home, except for getting medical care. Do not go to work, school, or public areas, and do not use public transportation or taxis.  Call ahead before visiting your doctor Before your medical appointment, call the healthcare provider and tell them that you have, or are being evaluated for, COVID-19 infection. This will help the healthcare provider's office take steps to keep other people from getting infected. Ask your healthcare provider to call the local or state health department.  Monitor your symptoms Seek prompt medical attention if your illness is worsening (e.g., difficulty breathing). Before going to your  medical appointment, call the healthcare provider and tell them that you have, or are being evaluated for, COVID-19 infection. Ask your healthcare provider to call the local or state health department.  Wear a facemask You should wear a facemask that covers your nose and mouth when you are in the same room with other people and when you visit a healthcare provider. People who live with or visit you should also wear a facemask while they are in the same room with you.  Separate yourself from other people in your home As much as possible, you should stay in a different room from other people in your home. Also, you should use a separate bathroom, if available.  Avoid sharing household items You should not share dishes, drinking glasses, cups, eating utensils, towels, bedding, or other items with other people in your home. After using these items, you should wash them thoroughly with soap and water.  Cover your coughs and sneezes Cover your mouth and nose with a tissue when you cough or sneeze, or you can cough or sneeze into your sleeve. Throw used tissues in a lined trash can, and immediately wash your hands with soap and water for at least 20 seconds or use an alcohol-based hand rub.  Wash your [facility] your hands often and thoroughly with soap and water for at least 20 seconds. You can use an alcohol-based hand sanitizer if soap and water are not available and if your hands are not visibly dirty. Avoid touching your eyes, nose, and mouth with unwashed hands.   Prevention Steps for Caregivers and Household Members of Individuals Confirmed to have, or Being Evaluated for, COVID-19  Infection Being Cared for in the Home  If you live with, or provide care at home for, a person confirmed to have, or being evaluated for, COVID-19 infection please follow these guidelines to prevent infection:  Follow healthcare provider's instructions Make sure that you understand and can help the  patient follow any healthcare provider instructions for all care.  Provide for the patient's basic needs You should help the patient with basic needs in the home and provide support for getting groceries, prescriptions, and other personal needs.  Monitor the patient's symptoms If they are getting sicker, call his or her medical provider and tell them that the patient has, or is being evaluated for, COVID-19 infection. This will help the healthcare provider's office take steps to keep other people from getting infected. Ask the healthcare provider to call the local or state health department.  Limit the number of people who have contact with the patient If possible, have only one caregiver for the patient. Other household members should stay in another home or place of residence. If this is not possible, they should stay in another room, or be separated from the patient as much as possible. Use a separate bathroom, if available. Restrict visitors who do not have an essential need to be in the home.  Keep older adults, very young children, and other sick people away from the patient Keep older adults, very young children, and those who have compromised immune systems or chronic health conditions away from the patient. This includes people with chronic heart, lung, or kidney conditions, diabetes, and cancer.  Ensure good ventilation Make sure that shared spaces in the home have good air flow, such as from an air conditioner or an opened window, weather permitting.  Wash your hands often Wash your hands often and thoroughly with soap and water for at least 20 seconds. You can use an alcohol based hand sanitizer if soap and water are not available and if your hands are not visibly dirty. Avoid touching your eyes, nose, and mouth with unwashed hands. Use disposable paper towels to dry your hands. If not available, use dedicated cloth towels and replace them when they become wet.  Wear a  facemask and gloves Wear a disposable facemask at all times in the room and gloves when you touch or have contact with the patient's blood, body fluids, and/or secretions or excretions, such as sweat, saliva, sputum, nasal mucus, vomit, urine, or feces.  Ensure the mask fits over your nose and mouth tightly, and do not touch it during use. Throw out disposable facemasks and gloves after using them. Do not reuse. Wash your hands immediately after removing your facemask and gloves. If your personal clothing becomes contaminated, carefully remove clothing and launder. Wash your hands after handling contaminated clothing. Place all used disposable facemasks, gloves, and other waste in a lined container before disposing them with other household waste. Remove gloves and wash your hands immediately after handling these items.  Do not share dishes, glasses, or other household items with the patient Avoid sharing household items. You should not share dishes, drinking glasses, cups, eating utensils, towels, bedding, or other items with a patient who is confirmed to have, or being evaluated for, COVID-19 infection. After the person uses these items, you should wash them thoroughly with soap and water.  Wash laundry thoroughly Immediately remove and wash clothes or bedding that have blood, body fluids, and/or secretions or excretions, such as sweat, saliva, sputum, nasal mucus, vomit, urine, or feces,  on them. Wear gloves when handling laundry from the patient. Read and follow directions on labels of laundry or clothing items and detergent. In general, wash and dry with the warmest temperatures recommended on the label.  Clean all areas the individual has used often Clean all touchable surfaces, such as counters, tabletops, doorknobs, bathroom fixtures, toilets, phones, keyboards, tablets, and bedside tables, every day. Also, clean any surfaces that may have blood, body fluids, and/or secretions or excretions  on them. Wear gloves when cleaning surfaces the patient has come in contact with. Use a diluted bleach solution (e.g., dilute bleach with 1 part bleach and 10 parts water) or a household disinfectant with a label that says EPA-registered for coronaviruses. To make a bleach solution at home, add 1 tablespoon of bleach to 1 quart (4 cups) of water. For a larger supply, add  cup of bleach to 1 gallon (16 cups) of water. Read labels of cleaning products and follow recommendations provided on product labels. Labels contain instructions for safe and effective use of the cleaning product including precautions you should take when applying the product, such as wearing gloves or eye protection and making sure you have good ventilation during use of the product. Remove gloves and wash hands immediately after cleaning.  Monitor yourself for signs and symptoms of illness Caregivers and household members are considered close contacts, should monitor their health, and will be asked to limit movement outside of the home to the extent possible. Follow the monitoring steps for close contacts listed on the symptom monitoring form.   ? If you have additional questions, contact your local health department or call the epidemiologist on call at [phone] (available 24/7). ? This guidance is subject to change. For the most up-to-date guidance from [facility], please refer to their website: YouBlogs.pl

## 2022-02-15 NOTE — Telephone Encounter (Signed)
Patient was scheduled for virtual visit 02/15/22  Eulis Foster, MD  [facility]  [phone]

## 2022-02-16 MED ORDER — ATORVASTATIN CALCIUM 40 MG PO TABS: 40.0000 mg | ORAL_TABLET | Freq: Every day | ORAL | 1 refills | Status: DC

## 2022-02-18 ENCOUNTER — Other Ambulatory Visit: Payer: Self-pay | Admitting: Family Medicine

## 2022-02-18 DIAGNOSIS — M792 Neuralgia and neuritis, unspecified: Secondary | ICD-10-CM

## 2022-02-20 MED ORDER — FENTANYL 50 MCG/HR TD PT72: 1.0000 | MEDICATED_PATCH | TRANSDERMAL | 0 refills | Status: DC

## 2022-03-01 DIAGNOSIS — I5020 Unspecified systolic (congestive) heart failure: Secondary | ICD-10-CM | POA: Diagnosis not present

## 2022-03-01 DIAGNOSIS — R0789 Other chest pain: Secondary | ICD-10-CM | POA: Diagnosis not present

## 2022-03-01 DIAGNOSIS — I25118 Atherosclerotic heart disease of native coronary artery with other forms of angina pectoris: Secondary | ICD-10-CM | POA: Diagnosis not present

## 2022-03-01 DIAGNOSIS — I130 Hypertensive heart and chronic kidney disease with heart failure and stage 1 through stage 4 chronic kidney disease, or unspecified chronic kidney disease: Secondary | ICD-10-CM | POA: Diagnosis not present

## 2022-03-01 DIAGNOSIS — R9439 Abnormal result of other cardiovascular function study: Secondary | ICD-10-CM | POA: Diagnosis not present

## 2022-03-01 DIAGNOSIS — I2510 Atherosclerotic heart disease of native coronary artery without angina pectoris: Secondary | ICD-10-CM | POA: Diagnosis not present

## 2022-03-01 DIAGNOSIS — Z7901 Long term (current) use of anticoagulants: Secondary | ICD-10-CM | POA: Diagnosis not present

## 2022-03-06 DIAGNOSIS — N1832 Chronic kidney disease, stage 3b: Secondary | ICD-10-CM | POA: Diagnosis not present

## 2022-03-06 DIAGNOSIS — N289 Disorder of kidney and ureter, unspecified: Secondary | ICD-10-CM | POA: Diagnosis not present

## 2022-03-06 DIAGNOSIS — I25118 Atherosclerotic heart disease of native coronary artery with other forms of angina pectoris: Secondary | ICD-10-CM | POA: Diagnosis not present

## 2022-03-08 DIAGNOSIS — I130 Hypertensive heart and chronic kidney disease with heart failure and stage 1 through stage 4 chronic kidney disease, or unspecified chronic kidney disease: Secondary | ICD-10-CM | POA: Diagnosis not present

## 2022-03-08 DIAGNOSIS — I5022 Chronic systolic (congestive) heart failure: Secondary | ICD-10-CM | POA: Diagnosis not present

## 2022-03-08 DIAGNOSIS — Z23 Encounter for immunization: Secondary | ICD-10-CM | POA: Diagnosis not present

## 2022-03-08 DIAGNOSIS — I2510 Atherosclerotic heart disease of native coronary artery without angina pectoris: Secondary | ICD-10-CM | POA: Diagnosis not present

## 2022-03-08 DIAGNOSIS — Z87891 Personal history of nicotine dependence: Secondary | ICD-10-CM | POA: Diagnosis not present

## 2022-03-08 DIAGNOSIS — I4819 Other persistent atrial fibrillation: Secondary | ICD-10-CM | POA: Diagnosis not present

## 2022-03-08 DIAGNOSIS — I25118 Atherosclerotic heart disease of native coronary artery with other forms of angina pectoris: Secondary | ICD-10-CM | POA: Diagnosis not present

## 2022-03-08 DIAGNOSIS — N183 Chronic kidney disease, stage 3 unspecified: Secondary | ICD-10-CM | POA: Diagnosis not present

## 2022-03-08 DIAGNOSIS — Z955 Presence of coronary angioplasty implant and graft: Secondary | ICD-10-CM | POA: Diagnosis not present

## 2022-03-09 DIAGNOSIS — I130 Hypertensive heart and chronic kidney disease with heart failure and stage 1 through stage 4 chronic kidney disease, or unspecified chronic kidney disease: Secondary | ICD-10-CM | POA: Diagnosis not present

## 2022-03-09 DIAGNOSIS — I25118 Atherosclerotic heart disease of native coronary artery with other forms of angina pectoris: Secondary | ICD-10-CM | POA: Diagnosis not present

## 2022-03-09 DIAGNOSIS — I5022 Chronic systolic (congestive) heart failure: Secondary | ICD-10-CM | POA: Diagnosis not present

## 2022-03-09 DIAGNOSIS — Z955 Presence of coronary angioplasty implant and graft: Secondary | ICD-10-CM | POA: Diagnosis not present

## 2022-03-09 DIAGNOSIS — Z87891 Personal history of nicotine dependence: Secondary | ICD-10-CM | POA: Diagnosis not present

## 2022-03-09 DIAGNOSIS — N183 Chronic kidney disease, stage 3 unspecified: Secondary | ICD-10-CM | POA: Diagnosis not present

## 2022-03-09 DIAGNOSIS — Z23 Encounter for immunization: Secondary | ICD-10-CM | POA: Diagnosis not present

## 2022-03-16 DIAGNOSIS — I1A0 Resistant hypertension: Secondary | ICD-10-CM | POA: Diagnosis not present

## 2022-03-16 DIAGNOSIS — I25118 Atherosclerotic heart disease of native coronary artery with other forms of angina pectoris: Secondary | ICD-10-CM | POA: Diagnosis not present

## 2022-03-16 DIAGNOSIS — I4819 Other persistent atrial fibrillation: Secondary | ICD-10-CM | POA: Diagnosis not present

## 2022-03-16 DIAGNOSIS — Z7901 Long term (current) use of anticoagulants: Secondary | ICD-10-CM | POA: Diagnosis not present

## 2022-03-22 ENCOUNTER — Telehealth: Payer: Self-pay | Admitting: Cardiology

## 2022-03-22 NOTE — Telephone Encounter (Signed)
Called patient and informed her that we have not seen her here in clinic in over a a year and that It looks like she is being managed by [facility] cardiology now. I advised her to call the Cardiology office that ordered this for her for further assistance.

## 2022-03-22 NOTE — Telephone Encounter (Signed)
Patient want to have cardiac rehab in [place]. Please advise

## 2022-04-03 ENCOUNTER — Other Ambulatory Visit: Payer: Self-pay | Admitting: Family Medicine

## 2022-04-03 DIAGNOSIS — M792 Neuralgia and neuritis, unspecified: Secondary | ICD-10-CM

## 2022-04-03 MED ORDER — PREGABALIN 150 MG PO CAPS: 150.0000 mg | ORAL_CAPSULE | Freq: Two times a day (BID) | ORAL | 0 refills | Status: DC

## 2022-04-03 NOTE — Telephone Encounter (Signed)
[facility] faxed refill request for the following medications:  pregabalin (LYRICA) 150 MG capsule   Please advise

## 2022-04-10 ENCOUNTER — Encounter: Payer: Medicare Other | Attending: Internal Medicine | Admitting: *Deleted

## 2022-04-10 ENCOUNTER — Encounter: Payer: Self-pay | Admitting: *Deleted

## 2022-04-10 DIAGNOSIS — Z955 Presence of coronary angioplasty implant and graft: Secondary | ICD-10-CM

## 2022-04-10 NOTE — Progress Notes (Signed)
Virtual orientation call completed today. shehas an appointment on Date: 04/24/2022  for EP eval and gym Orientation.  Documentation of diagnosis can be found in [facility]  Date: 03/16/2022 .

## 2022-04-20 ENCOUNTER — Other Ambulatory Visit: Payer: Self-pay | Admitting: Family Medicine

## 2022-04-20 DIAGNOSIS — M792 Neuralgia and neuritis, unspecified: Secondary | ICD-10-CM

## 2022-04-21 ENCOUNTER — Encounter: Payer: Self-pay | Admitting: Family Medicine

## 2022-04-21 MED ORDER — FENTANYL 50 MCG/HR TD PT72: 1.0000 | MEDICATED_PATCH | TRANSDERMAL | 0 refills | Status: DC

## 2022-04-23 ENCOUNTER — Encounter: Payer: Self-pay | Admitting: Family Medicine

## 2022-04-24 ENCOUNTER — Encounter: Payer: Medicare Other | Attending: Cardiology | Admitting: *Deleted

## 2022-04-24 ENCOUNTER — Encounter: Payer: Self-pay | Admitting: Family Medicine

## 2022-04-24 VITALS — Ht 63.5 in | Wt 127.9 lb

## 2022-04-24 DIAGNOSIS — M792 Neuralgia and neuritis, unspecified: Secondary | ICD-10-CM

## 2022-04-24 DIAGNOSIS — Z48812 Encounter for surgical aftercare following surgery on the circulatory system: Secondary | ICD-10-CM | POA: Diagnosis not present

## 2022-04-24 DIAGNOSIS — Z955 Presence of coronary angioplasty implant and graft: Secondary | ICD-10-CM | POA: Diagnosis not present

## 2022-04-24 NOTE — Progress Notes (Signed)
Cardiac Individual Treatment Plan  Patient Details  Name: Dawn Bruce MRN: [id number] Date of Birth: June 18, 1945 Referring Provider:   Flowsheet Row Cardiac Rehab from 04/24/2022 in [facility] Cardiac and Pulmonary Rehab  Referring Provider Rolla Plate       Initial Encounter Date:  Flowsheet Row Cardiac Rehab from 04/24/2022 in [facility] Cardiac and Pulmonary Rehab  Date 04/24/22       Visit Diagnosis: Status post coronary artery stent placement  Patient's Home Medications on Admission:  Current Outpatient Medications:    amLODipine (NORVASC) 5 MG tablet, Take 1 tablet (5 mg total) by mouth daily., Disp: 90 tablet, Rfl: 1   apixaban (ELIQUIS) 5 MG TABS tablet, Take 1 tablet (5 mg total) by mouth 2 (two) times daily., Disp: 60 tablet, Rfl: 5   atorvastatin (LIPITOR) 40 MG tablet, Take 1 tablet (40 mg total) by mouth daily., Disp: 90 tablet, Rfl: 1   carvedilol (COREG) 6.25 MG tablet, Take 6.25 mg by mouth 2 (two) times daily., Disp: , Rfl:    clopidogrel (PLAVIX) 75 MG tablet, Take by mouth., Disp: , Rfl:    famotidine (PEPCID) 40 MG tablet, Take 1 tablet (40 mg total) by mouth daily., Disp: 90 tablet, Rfl: 0   fentaNYL (DURAGESIC) 50 MCG/HR, Place 1 patch onto the skin every 3 (three) days., Disp: 10 patch, Rfl: 0   pregabalin (LYRICA) 150 MG capsule, Take 1 capsule (150 mg total) by mouth 2 (two) times daily., Disp: 180 capsule, Rfl: 0   valsartan (DIOVAN) 160 MG tablet, Take 160 mg by mouth in the morning and at bedtime., Disp: , Rfl:   Past Medical History: Past Medical History:  Diagnosis Date   Actinic keratosis    Chronic pain    History of basal cell carcinoma (BCC) 05/02/2021   right upper back paraspinal   History of SCC (squamous cell carcinoma) of skin 09/10/2019   left dorsum wrist ED&C done 10/28/19   History of SCC (squamous cell carcinoma) of skin 03/04/2020   right dorsum hand   History of squamous cell carcinoma in situ (SCCIS) 03/04/2020   left dorsum wrist  ED&C   History  of squamous cell carcinoma in situ (SCCIS) 03/04/2020   right medial infraorbital  ED&C 04/28/2020   History of squamous cell carcinoma in situ (SCCIS) 10/28/2019   right dorsum hand proximal lateral   History of squamous cell carcinoma in situ (SCCIS) 10/28/2019   right dorsum hand proximal medial   Hyperlipidemia    Hypertension    Renal disorder    Squamous cell carcinoma in situ 10/31/2021   left distal  tricep, EDC    Tobacco Use: Social History   Tobacco Use  Smoking Status Former   Packs/day: 0.50   Years: 5.00   Total pack years: 2.50   Types: Cigarettes   Quit date: 1990   Years since quitting: 33.9   Passive exposure: Past  Smokeless Tobacco Never  Tobacco Comments   quit around 1990-1995 (?)    Labs: Review Flowsheet  More data exists      Latest Ref Rng & Units 04/20/2020 12/03/2020 08/31/2021 10/25/2021 12/12/2021  Labs for ITP Cardiac and Pulmonary Rehab  Cholestrol 100 - 199 mg/dL 178  - - 201  -  LDL (calc) 0 - 99 mg/dL 102  - - 108  -  HDL-C >39 mg/dL 61  - - 71  -  Trlycerides 0 - 149 mg/dL 79  - - 125  -  Hemoglobin A1c 4.8 - 5.6 %  5.9  6.0  6.5  - 5.5      Exercise Target Goals: Exercise Program Goal: Individual exercise prescription set using results from initial 6 min walk test and THRR while considering  patient's activity barriers and safety.   Exercise Prescription Goal: Initial exercise prescription builds to 30-45 minutes a day of aerobic activity, 2-3 days per week.  Home exercise guidelines will be given to patient during program as part of exercise prescription that the participant will acknowledge.   Education: Aerobic Exercise: - Group verbal and visual presentation on the components of exercise prescription. Introduces F.I.T.T principle from ACSM for exercise prescriptions.  Reviews F.I.T.T. principles of aerobic exercise including progression. Written material given at graduation.   Education: Resistance Exercise: - Group verbal and  visual presentation on the components of exercise prescription. Introduces F.I.T.T principle from ACSM for exercise prescriptions  Reviews F.I.T.T. principles of resistance exercise including progression. Written material given at graduation.    Education: Exercise & Equipment Safety: - Individual verbal instruction and demonstration of equipment use and safety with use of the equipment. Flowsheet Row Cardiac Rehab from 04/24/2022 in [facility] Cardiac and Pulmonary Rehab  Date 04/24/22  Educator [facility]  Instruction Review Code 1- Verbalizes Understanding       Education: Exercise Physiology & General Exercise Guidelines: - Group verbal and written instruction with models to review the exercise physiology of the cardiovascular system and associated critical values. Provides general exercise guidelines with specific guidelines to those with heart or lung disease.  Flowsheet Row Cardiac Rehab from 04/24/2022 in [facility] Cardiac and Pulmonary Rehab  Education need identified 04/24/22       Education: Flexibility, Balance, Mind/Body Relaxation: - Group verbal and visual presentation with interactive activity on the components of exercise prescription. Introduces F.I.T.T principle from ACSM for exercise prescriptions. Reviews F.I.T.T. principles of flexibility and balance exercise training including progression. Also discusses the mind body connection.  Reviews various relaxation techniques to help reduce and manage stress (i.e. Deep breathing, progressive muscle relaxation, and visualization). Balance handout provided to take home. Written material given at graduation.   Activity Barriers & Risk Stratification:  Activity Barriers & Cardiac Risk Stratification - 04/24/22 1648       Activity Barriers & Cardiac Risk Stratification   Activity Barriers Other (comment)    Comments nerve pain from horse back riding accident in left hand             6 Minute Walk:  6 Minute Walk     Row Name 04/24/22  1644         6 Minute Walk   Phase Initial     Distance 1290 feet     Walk Time 6 minutes     # of Rest Breaks 0     MPH 2.44     METS 2.55     RPE 8     Perceived Dyspnea  0     VO2 Peak 8.93     Symptoms No     Resting HR 63 bpm     Resting BP 112/72     Resting Oxygen Saturation  99 %     Exercise Oxygen Saturation  during 6 min walk 98 %     Max Ex. HR 83 bpm     Max Ex. BP 142/80     2 Minute Post BP 122/80              Oxygen Initial Assessment:   Oxygen Re-Evaluation:  Oxygen Discharge (Final Oxygen Re-Evaluation):   Initial Exercise Prescription:  Initial Exercise Prescription - 04/24/22 1600       Date of Initial Exercise RX and Referring Provider   Date 04/24/22    Referring Provider Rolla Plate      Oxygen   Maintain Oxygen Saturation 88% or higher      Treadmill   MPH 2    Grade 1    Minutes 15    METs 2.81      Recumbant Bike   Level 2    RPM 50    Minutes 15    METs 2.55      REL-XR   Level 2    Speed 50    Minutes 15    METs 2.55      T5 Nustep   Level 2    SPM 80    Minutes 15    METs 2.55      Track   Laps 35    Minutes 15    METs 2.9      Prescription Details   Frequency (times per week) 3    Duration Progress to 30 minutes of continuous aerobic without signs/symptoms of physical distress      Intensity   THRR 40-80% of Max Heartrate 94-126    Ratings of Perceived Exertion 11-13    Perceived Dyspnea 0-4      Progression   Progression Continue to progress workloads to maintain intensity without signs/symptoms of physical distress.      Resistance Training   Training Prescription Yes    Weight 3    Reps 10-15             Perform Capillary Blood Glucose checks as needed.  Exercise Prescription Changes:   Exercise Prescription Changes     Row Name 04/24/22 1600             Response to Exercise   Blood Pressure (Admit) 112/72       Blood Pressure (Exercise) 142/80       Blood Pressure (Exit)  122/80       Heart Rate (Admit) 63 bpm       Heart Rate (Exercise) 83 bpm       Heart Rate (Exit) 63 bpm       Oxygen Saturation (Admit) 99 %       Oxygen Saturation (Exercise) 98 %       Oxygen Saturation (Exit) 99 %       Rating of Perceived Exertion (Exercise) 8       Perceived Dyspnea (Exercise) 0       Symptoms none       Comments 6 MWT results                Exercise Comments:   Exercise Goals and Review:   Exercise Goals     Row Name 04/24/22 1655             Exercise Goals   Increase Physical Activity Yes       Intervention Provide advice, education, support and counseling about physical activity/exercise needs.;Develop an individualized exercise prescription for aerobic and resistive training based on initial evaluation findings, risk stratification, comorbidities and participant's personal goals.       Expected Outcomes Short Term: Attend rehab on a regular basis to increase amount of physical activity.;Long Term: Exercising regularly at least 3-5 days a week.;Long Term: Add in home exercise to make exercise part of routine and to increase  amount of physical activity.       Increase Strength and Stamina Yes       Intervention Provide advice, education, support and counseling about physical activity/exercise needs.;Develop an individualized exercise prescription for aerobic and resistive training based on initial evaluation findings, risk stratification, comorbidities and participant's personal goals.       Expected Outcomes Short Term: Increase workloads from initial exercise prescription for resistance, speed, and METs.;Short Term: Perform resistance training exercises routinely during rehab and add in resistance training at home;Long Term: Improve cardiorespiratory fitness, muscular endurance and strength as measured by increased METs and functional capacity (6MWT)       Able to understand and use rate of perceived exertion (RPE) scale Yes       Intervention Provide  education and explanation on how to use RPE scale       Expected Outcomes Short Term: Able to use RPE daily in rehab to express subjective intensity level;Long Term:  Able to use RPE to guide intensity level when exercising independently       Able to understand and use Dyspnea scale Yes       Intervention Provide education and explanation on how to use Dyspnea scale       Expected Outcomes Short Term: Able to use Dyspnea scale daily in rehab to express subjective sense of shortness of breath during exertion;Long Term: Able to use Dyspnea scale to guide intensity level when exercising independently       Knowledge and understanding of Target Heart Rate Range (THRR) Yes       Intervention Provide education and explanation of THRR including how the numbers were predicted and where they are located for reference       Expected Outcomes Short Term: Able to state/look up THRR;Long Term: Able to use THRR to govern intensity when exercising independently;Short Term: Able to use daily as guideline for intensity in rehab       Able to check pulse independently Yes       Intervention Provide education and demonstration on how to check pulse in carotid and radial arteries.;Review the importance of being able to check your own pulse for safety during independent exercise       Expected Outcomes Short Term: Able to explain why pulse checking is important during independent exercise       Understanding of Exercise Prescription Yes       Intervention Provide education, explanation, and written materials on patient's individual exercise prescription       Expected Outcomes Short Term: Able to explain program exercise prescription;Long Term: Able to explain home exercise prescription to exercise independently                Exercise Goals Re-Evaluation :   Discharge Exercise Prescription (Final Exercise Prescription Changes):  Exercise Prescription Changes - 04/24/22 1600       Response to Exercise    Blood Pressure (Admit) 112/72    Blood Pressure (Exercise) 142/80    Blood Pressure (Exit) 122/80    Heart Rate (Admit) 63 bpm    Heart Rate (Exercise) 83 bpm    Heart Rate (Exit) 63 bpm    Oxygen Saturation (Admit) 99 %    Oxygen Saturation (Exercise) 98 %    Oxygen Saturation (Exit) 99 %    Rating of Perceived Exertion (Exercise) 8    Perceived Dyspnea (Exercise) 0    Symptoms none    Comments 6 MWT results  Nutrition:  Target Goals: Understanding of nutrition guidelines, daily intake of sodium '1500mg'$ , cholesterol '200mg'$ , calories 30% from fat and 7% or less from saturated fats, daily to have 5 or more servings of fruits and vegetables.  Education: All About Nutrition: -Group instruction provided by verbal, written material, interactive activities, discussions, models, and posters to present general guidelines for heart healthy nutrition including fat, fiber, MyPlate, the role of sodium in heart healthy nutrition, utilization of the nutrition label, and utilization of this knowledge for meal planning. Follow up email sent as well. Written material given at graduation. Flowsheet Row Cardiac Rehab from 04/24/2022 in [facility] Cardiac and Pulmonary Rehab  Education need identified 04/24/22       Biometrics:  Pre Biometrics - 04/24/22 1656       Pre Biometrics   Height 5' 3.5" (1.613 m)    Weight 127 lb 14.4 oz (58 kg)    BMI (Calculated) 22.3    Single Leg Stand 7.07 seconds              Nutrition Therapy Plan and Nutrition Goals:  Nutrition Therapy & Goals - 04/24/22 1427       Nutrition Therapy   Diet Heart healthy, low Na    Drug/Food Interactions Statins/Certain Fruits    Protein (specify units) 65-75g    Fiber 25 grams    Whole Grain Foods 3 servings    Saturated Fats 12 max. grams    Fruits and Vegetables 8 servings/day    Sodium 2 grams      Personal Nutrition Goals   Nutrition Goal ST: practice MyPlate guidelines, include 1 additional whole  grain into routine, read food labels LT: limit sodium <2g/day, follow MyPlate guidelines    Comments 76 y.o. F admitted to cardiac rehab s/p stent placement. 21 standard drinks per week. PMHx chronic systolic HF, a.fib, HTN, HLD, previous tobacco use, skin cancer, CKD stg 3, GERD. Relevant medications lipitor, fentanyl, pepcid, lyrica.  PYP Score: Vegetables & Fruits 8/12. Breads, Grains & Cereals 4/12. Red & Processed Meat 8/12. Poultry 2/2. Fish & Shellfish 0/4. Beans, Nuts & Seeds 1/4. Milk & Dairy Foods 4/6. Toppings, Oils, Seasonings & Salt 10/20. Sweets, Snacks & Restaurant Food 10/14. Beverages 6/10. B: coffee (cream or half and half and 2 stevia packets) with grapes L: soups (canned) or grilled sandwich (tuna or ham and cheese on white bread) or leftovers. Sometimes will go to [employer] grill and get vegetables, hamburgers (1x/week) and hot dogs (every coupl of weeks) D: meat (chicken, pork or beef), starch, and non-starchy vegetables. Dawn Bruce reports they will eat more refined grains and smaller amounts of wheat products. She reports salting food while cooking. She cooks with butter. Drinks: water, 1/2 diet pepsi can at lunch. Alcohol: 2 drinks before dinner. Dawn Bruce reports eating lots of fresh fruits and vegetables over the summer from the farmers market. Discussed heart healthy eating; encouraged include whole grains, following MyPlate structure, and managing sodium by reading labels.      Intervention Plan   Intervention Prescribe, educate and counsel regarding individualized specific dietary modifications aiming towards targeted core components such as weight, hypertension, lipid management, diabetes, heart failure and other comorbidities.;Nutrition handout(s) given to patient.    Expected Outcomes Short Term Goal: Understand basic principles of dietary content, such as calories, fat, sodium, cholesterol and nutrients.;Short Term Goal: A plan has been developed with personal nutrition goals set during  dietitian appointment.;Long Term Goal: Adherence to prescribed nutrition plan.  Nutrition Assessments:  MEDIFICTS Score Key: ?70 Need to make dietary changes  40-70 Heart Healthy Diet ? 40 Therapeutic Level Cholesterol Diet   Picture Your Plate Scores: <94 Unhealthy dietary pattern with much room for improvement. 41-50 Dietary pattern unlikely to meet recommendations for good health and room for improvement. 51-60 More healthful dietary pattern, with some room for improvement.  >60 Healthy dietary pattern, although there may be some specific behaviors that could be improved.    Nutrition Goals Re-Evaluation:   Nutrition Goals Discharge (Final Nutrition Goals Re-Evaluation):   Psychosocial: Target Goals: Acknowledge presence or absence of significant depression and/or stress, maximize coping skills, provide positive support system. Participant is able to verbalize types and ability to use techniques and skills needed for reducing stress and depression.   Education: Stress, Anxiety, and Depression - Group verbal and visual presentation to define topics covered.  Reviews how body is impacted by stress, anxiety, and depression.  Also discusses healthy ways to reduce stress and to treat/manage anxiety and depression.  Written material given at graduation. Flowsheet Row Cardiac Rehab from 04/24/2022 in [facility] Cardiac and Pulmonary Rehab  Education need identified 04/24/22       Education: Sleep Hygiene -Provides group verbal and written instruction about how sleep can affect your health.  Define sleep hygiene, discuss sleep cycles and impact of sleep habits. Review good sleep hygiene tips.    Initial Review & Psychosocial Screening:  Initial Psych Review & Screening - 04/10/22 1112       Initial Review   Current issues with None Identified      Family Dynamics   Good Support System? Yes   husband, daughter , granddaughter     Barriers   Psychosocial barriers  to participate in program There are no identifiable barriers or psychosocial needs.      Screening Interventions   Interventions To provide support and resources with identified psychosocial needs;Provide feedback about the scores to participant    Expected Outcomes Short Term goal: Utilizing psychosocial counselor, staff and physician to assist with identification of specific Stressors or current issues interfering with healing process. Setting desired goal for each stressor or current issue identified.;Long Term Goal: Stressors or current issues are controlled or eliminated.;Short Term goal: Identification and review with participant of any Quality of Life or Depression concerns found by scoring the questionnaire.;Long Term goal: The participant improves quality of Life and PHQ9 Scores as seen by post scores and/or verbalization of changes             Quality of Life Scores:   Quality of Life - 04/24/22 1656       Quality of Life   Select Quality of Life      Quality of Life Scores   Health/Function Pre 23.2 %    Socioeconomic Pre 24.06 %    Psych/Spiritual Pre 27.43 %    Family Pre 25.2 %    GLOBAL Pre 24.53 %            Scores of 19 and below usually indicate a poorer quality of life in these areas.  A difference of  2-3 points is a clinically meaningful difference.  A difference of 2-3 points in the total score of the Quality of Life Index has been associated with significant improvement in overall quality of life, self-image, physical symptoms, and general health in studies assessing change in quality of life.  PHQ-9: Review Flowsheet  More data exists      04/24/2022 12/12/2021 09/27/2021  07/04/2021 05/10/2021  Depression screen PHQ 2/9  Decreased Interest 0 0 0 0 0  Down, Depressed, Hopeless 0 0 0 0 0  PHQ - 2 Score 0 0 0 0 0  Altered sleeping 1 0 0 0 0  Tired, decreased energy 0 0 0 0 0  Change in appetite 0 0 0 0 0  Feeling bad or failure about yourself  0 0 0 0 0   Trouble concentrating 0 0 0 0 0  Moving slowly or fidgety/restless 0 0 0 0 0  Suicidal thoughts 0 0 0 0 0  PHQ-9 Score 1 0 0 0 0  Difficult doing work/chores Not difficult at all Not difficult at all Not difficult at all Not difficult at all Not difficult at all   Interpretation of Total Score  Total Score Depression Severity:  1-4 = Minimal depression, 5-9 = Mild depression, 10-14 = Moderate depression, 15-19 = Moderately severe depression, 20-27 = Severe depression   Psychosocial Evaluation and Intervention:  Psychosocial Evaluation - 04/10/22 1133       Psychosocial Evaluation & Interventions   Interventions Encouraged to exercise with the program and follow exercise prescription    Comments Dawn Bruce has no barriers to attending the program. She lives with her husband and they have a [place] dog we ever had".  Her husband and her daughter are her support. She hopes to improve her stamina and energy and get back into an exercise routine. She is ready to start the program    Expected Outcomes STG Dawn Bruce attends all scheduled sessions, she is able to progress with her exercise prescription. LTG Dawn Bruce continues  her  exercise progression after discharge.    Continue Psychosocial Services  Follow up required by staff             Psychosocial Re-Evaluation:   Psychosocial Discharge (Final Psychosocial Re-Evaluation):   Vocational Rehabilitation: Provide vocational rehab assistance to qualifying candidates.   Vocational Rehab Evaluation & Intervention:  Vocational Rehab - 04/10/22 1118       Initial Vocational Rehab Evaluation & Intervention   Assessment shows need for Vocational Rehabilitation No      Vocational Rehab Re-Evaulation   Comments retired             Education: Education Goals: Education classes will be provided on a variety of topics geared toward better understanding of heart health and risk factor modification. Participant will state understanding/return  demonstration of topics presented as noted by education test scores.  Learning Barriers/Preferences:  Learning Barriers/Preferences - 04/10/22 1118       Learning Barriers/Preferences   Learning Barriers None    Learning Preferences None             General Cardiac Education Topics:  AED/CPR: - Group verbal and written instruction with the use of models to demonstrate the basic use of the AED with the basic ABC's of resuscitation.   Anatomy and Cardiac Procedures: - Group verbal and visual presentation and models provide information about basic cardiac anatomy and function. Reviews the testing methods done to diagnose heart disease and the outcomes of the test results. Describes the treatment choices: Medical Management, Angioplasty, or Coronary Bypass Surgery for treating various heart conditions including Myocardial Infarction, Angina, Valve Disease, and Cardiac Arrhythmias.  Written material given at graduation.   Medication Safety: - Group verbal and visual instruction to review commonly prescribed medications for heart and lung disease. Reviews the medication, class of the drug, and side effects. Includes the  steps to properly store meds and maintain the prescription regimen.  Written material given at graduation.   Intimacy: - Group verbal instruction through game format to discuss how heart and lung disease can affect sexual intimacy. Written material given at graduation..   Know Your Numbers and Heart Failure: - Group verbal and visual instruction to discuss disease risk factors for cardiac and pulmonary disease and treatment options.  Reviews associated critical values for Overweight/Obesity, Hypertension, Cholesterol, and Diabetes.  Discusses basics of heart failure: signs/symptoms and treatments.  Introduces Heart Failure Zone chart for action plan for heart failure.  Written material given at graduation.   Infection Prevention: - Provides verbal and written material  to individual with discussion of infection control including proper hand washing and proper equipment cleaning during exercise session. Flowsheet Row Cardiac Rehab from 04/24/2022 in [facility] Cardiac and Pulmonary Rehab  Date 04/24/22  Educator [facility]  Instruction Review Code 1- Verbalizes Understanding       Falls Prevention: - Provides verbal and written material to individual with discussion of falls prevention and safety. Flowsheet Row Cardiac Rehab from 04/24/2022 in [facility] Cardiac and Pulmonary Rehab  Date 04/24/22  Educator [facility]  Instruction Review Code 1- Verbalizes Understanding       Other: -Provides group and verbal instruction on various topics (see comments)   Knowledge Questionnaire Score:  Knowledge Questionnaire Score - 04/24/22 1657       Knowledge Questionnaire Score   Pre Score 20/26             Core Components/Risk Factors/Patient Goals at Admission:  Personal Goals and Risk Factors at Admission - 04/24/22 1657       Core Components/Risk Factors/Patient Goals on Admission    Weight Management Yes    Intervention Weight Management: Provide education and appropriate resources to help participant work on and attain dietary goals.;Weight Management: Develop a combined nutrition and exercise program designed to reach desired caloric intake, while maintaining appropriate intake of nutrient and fiber, sodium and fats, and appropriate energy expenditure required for the weight goal.    Admit Weight 127 lb 14.4 oz (58 kg)    Goal Weight: Short Term 127 lb (57.6 kg)    Goal Weight: Long Term 127 lb (57.6 kg)    Expected Outcomes Short Term: Continue to assess and modify interventions until short term weight is achieved;Long Term: Adherence to nutrition and physical activity/exercise program aimed toward attainment of established weight goal;Weight Maintenance: Understanding of the daily nutrition guidelines, which includes 25-35% calories from fat, 7% or less cal from  saturated fats, less than '200mg'$  cholesterol, less than 1.5gm of sodium, & 5 or more servings of fruits and vegetables daily;Understanding recommendations for meals to include 15-35% energy as protein, 25-35% energy from fat, 35-60% energy from carbohydrates, less than '200mg'$  of dietary cholesterol, 20-35 gm of total fiber daily;Understanding of distribution of calorie intake throughout the day with the consumption of 4-5 meals/snacks    Hypertension Yes    Intervention Provide education on lifestyle modifcations including regular physical activity/exercise, weight management, moderate sodium restriction and increased consumption of fresh fruit, vegetables, and low fat dairy, alcohol moderation, and smoking cessation.;Monitor prescription use compliance.    Expected Outcomes Short Term: Continued assessment and intervention until BP is < 140/75m HG in hypertensive participants. < 130/887mHG in hypertensive participants with diabetes, heart failure or chronic kidney disease.;Long Term: Maintenance of blood pressure at goal levels.    Lipids Yes    Intervention Provide education and support  for participant on nutrition & aerobic/resistive exercise along with prescribed medications to achieve LDL '70mg'$ , HDL >'40mg'$ .    Expected Outcomes Short Term: Participant states understanding of desired cholesterol values and is compliant with medications prescribed. Participant is following exercise prescription and nutrition guidelines.;Long Term: Cholesterol controlled with medications as prescribed, with individualized exercise RX and with personalized nutrition plan. Value goals: LDL < '70mg'$ , HDL > 40 mg.             Education:Diabetes - Individual verbal and written instruction to review signs/symptoms of diabetes, desired ranges of glucose level fasting, after meals and with exercise. Acknowledge that pre and post exercise glucose checks will be done for 3 sessions at entry of program.   Core Components/Risk  Factors/Patient Goals Review:    Core Components/Risk Factors/Patient Goals at Discharge (Final Review):    ITP Comments:  ITP Comments     Row Name 04/10/22 1137 04/24/22 1501         ITP Comments Virtual orientation call completed today. shehas an appointment on Date: 04/24/2022  for EP eval and gym Orientation.  Documentation of diagnosis can be found in [facility]  Date: 03/16/2022 . Completed 6MWT and gym orientation. Initial ITP created and sent for review to Dr. Emily Filbert, Medical Director.               Comments: initial ITP

## 2022-04-24 NOTE — Patient Instructions (Signed)
Patient Instructions  Patient Details  Name: Dawn Bruce MRN: [id number] Date of Birth: 1945/05/25 Referring Provider:  Georga Hacking, MD  Below are your personal goals for exercise, nutrition, and risk factors. Our goal is to help you stay on track towards obtaining and maintaining these goals. We will be discussing your progress on these goals with you throughout the program.  Initial Exercise Prescription:  Initial Exercise Prescription - 04/24/22 1600       Date of Initial Exercise RX and Referring Provider   Date 04/24/22    Referring Provider Rolla Plate      Oxygen   Maintain Oxygen Saturation 88% or higher      Treadmill   MPH 2    Grade 1    Minutes 15    METs 2.81      Recumbant Bike   Level 2    RPM 50    Minutes 15    METs 2.55      REL-XR   Level 2    Speed 50    Minutes 15    METs 2.55      T5 Nustep   Level 2    SPM 80    Minutes 15    METs 2.55      Track   Laps 35    Minutes 15    METs 2.9      Prescription Details   Frequency (times per week) 3    Duration Progress to 30 minutes of continuous aerobic without signs/symptoms of physical distress      Intensity   THRR 40-80% of Max Heartrate 94-126    Ratings of Perceived Exertion 11-13    Perceived Dyspnea 0-4      Progression   Progression Continue to progress workloads to maintain intensity without signs/symptoms of physical distress.      Resistance Training   Training Prescription Yes    Weight 3    Reps 10-15             Exercise Goals: Frequency: Be able to perform aerobic exercise two to three times per week in program working toward 2-5 days per week of home exercise.  Intensity: Work with a perceived exertion of 11 (fairly light) - 15 (hard) while following your exercise prescription.  We will make changes to your prescription with you as you progress through the program.   Duration: Be able to do 30 to 45 minutes of continuous aerobic exercise in addition to a 5 minute  warm-up and a 5 minute cool-down routine.   Nutrition Goals: Your personal nutrition goals will be established when you do your nutrition analysis with the dietician.  The following are general nutrition guidelines to follow: Cholesterol < '200mg'$ /day Sodium < '1500mg'$ /day Fiber: Women over 50 yrs - 21 grams per day  Personal Goals:  Personal Goals and Risk Factors at Admission - 04/24/22 1657       Core Components/Risk Factors/Patient Goals on Admission    Weight Management Yes    Intervention Weight Management: Provide education and appropriate resources to help participant work on and attain dietary goals.;Weight Management: Develop a combined nutrition and exercise program designed to reach desired caloric intake, while maintaining appropriate intake of nutrient and fiber, sodium and fats, and appropriate energy expenditure required for the weight goal.    Admit Weight 127 lb 14.4 oz (58 kg)    Goal Weight: Short Term 127 lb (57.6 kg)    Goal Weight: Long Term 127 lb (57.6 kg)  Expected Outcomes Short Term: Continue to assess and modify interventions until short term weight is achieved;Long Term: Adherence to nutrition and physical activity/exercise program aimed toward attainment of established weight goal;Weight Maintenance: Understanding of the daily nutrition guidelines, which includes 25-35% calories from fat, 7% or less cal from saturated fats, less than '200mg'$  cholesterol, less than 1.5gm of sodium, & 5 or more servings of fruits and vegetables daily;Understanding recommendations for meals to include 15-35% energy as protein, 25-35% energy from fat, 35-60% energy from carbohydrates, less than '200mg'$  of dietary cholesterol, 20-35 gm of total fiber daily;Understanding of distribution of calorie intake throughout the day with the consumption of 4-5 meals/snacks    Hypertension Yes    Intervention Provide education on lifestyle modifcations including regular physical activity/exercise,  weight management, moderate sodium restriction and increased consumption of fresh fruit, vegetables, and low fat dairy, alcohol moderation, and smoking cessation.;Monitor prescription use compliance.    Expected Outcomes Short Term: Continued assessment and intervention until BP is < 140/16m HG in hypertensive participants. < 130/813mHG in hypertensive participants with diabetes, heart failure or chronic kidney disease.;Long Term: Maintenance of blood pressure at goal levels.    Lipids Yes    Intervention Provide education and support for participant on nutrition & aerobic/resistive exercise along with prescribed medications to achieve LDL '70mg'$ , HDL >'40mg'$ .    Expected Outcomes Short Term: Participant states understanding of desired cholesterol values and is compliant with medications prescribed. Participant is following exercise prescription and nutrition guidelines.;Long Term: Cholesterol controlled with medications as prescribed, with individualized exercise RX and with personalized nutrition plan. Value goals: LDL < '70mg'$ , HDL > 40 mg.             Tobacco Use Initial Evaluation: Social History   Tobacco Use  Smoking Status Former   Packs/day: 0.50   Years: 5.00   Total pack years: 2.50   Types: Cigarettes   Quit date: 1990   Years since quitting: 33.9   Passive exposure: Past  Smokeless Tobacco Never  Tobacco Comments   quit around 1990-1995 (?)    Exercise Goals and Review:  Exercise Goals     Row Name 04/24/22 1655             Exercise Goals   Increase Physical Activity Yes       Intervention Provide advice, education, support and counseling about physical activity/exercise needs.;Develop an individualized exercise prescription for aerobic and resistive training based on initial evaluation findings, risk stratification, comorbidities and participant's personal goals.       Expected Outcomes Short Term: Attend rehab on a regular basis to increase amount of physical  activity.;Long Term: Exercising regularly at least 3-5 days a week.;Long Term: Add in home exercise to make exercise part of routine and to increase amount of physical activity.       Increase Strength and Stamina Yes       Intervention Provide advice, education, support and counseling about physical activity/exercise needs.;Develop an individualized exercise prescription for aerobic and resistive training based on initial evaluation findings, risk stratification, comorbidities and participant's personal goals.       Expected Outcomes Short Term: Increase workloads from initial exercise prescription for resistance, speed, and METs.;Short Term: Perform resistance training exercises routinely during rehab and add in resistance training at home;Long Term: Improve cardiorespiratory fitness, muscular endurance and strength as measured by increased METs and functional capacity (6MWT)       Able to understand and use rate of perceived exertion (RPE) scale Yes  Intervention Provide education and explanation on how to use RPE scale       Expected Outcomes Short Term: Able to use RPE daily in rehab to express subjective intensity level;Long Term:  Able to use RPE to guide intensity level when exercising independently       Able to understand and use Dyspnea scale Yes       Intervention Provide education and explanation on how to use Dyspnea scale       Expected Outcomes Short Term: Able to use Dyspnea scale daily in rehab to express subjective sense of shortness of breath during exertion;Long Term: Able to use Dyspnea scale to guide intensity level when exercising independently       Knowledge and understanding of Target Heart Rate Range (THRR) Yes       Intervention Provide education and explanation of THRR including how the numbers were predicted and where they are located for reference       Expected Outcomes Short Term: Able to state/look up THRR;Long Term: Able to use THRR to govern intensity when  exercising independently;Short Term: Able to use daily as guideline for intensity in rehab       Able to check pulse independently Yes       Intervention Provide education and demonstration on how to check pulse in carotid and radial arteries.;Review the importance of being able to check your own pulse for safety during independent exercise       Expected Outcomes Short Term: Able to explain why pulse checking is important during independent exercise       Understanding of Exercise Prescription Yes       Intervention Provide education, explanation, and written materials on patient's individual exercise prescription       Expected Outcomes Short Term: Able to explain program exercise prescription;Long Term: Able to explain home exercise prescription to exercise independently                Copy of goals given to participant.

## 2022-04-25 MED ORDER — FENTANYL 50 MCG/HR TD PT72: 1.0000 | MEDICATED_PATCH | TRANSDERMAL | 0 refills | Status: DC

## 2022-04-26 NOTE — Progress Notes (Signed)
I,Sulibeya S Dimas,acting as a Neurosurgeon for Shirlee Latch, MD.,have documented all relevant documentation on the behalf of Shirlee Latch, MD,as directed by  Shirlee Latch, MD while in the presence of Shirlee Latch, MD.   MyChart Video Visit    Virtual Visit via Video Note   This format is felt to be most appropriate for this patient at this time. Physical exam was limited by quality of the video and audio technology used for the visit.    Patient location: home Provider location: [facility] Persons involved in the visit: patient, provider   I discussed the limitations of evaluation and management by telemedicine and the availability of in person appointments. The patient expressed understanding and agreed to proceed.  Patient: Dawn Bruce   DOB: 1945/11/16   76 y.o. Female  MRN: [id number] Visit Date: 04/27/2022  Today's healthcare provider: Shirlee Latch, MD   Chief Complaint  Patient presents with   Insomnia   Subjective    HPI  Follow up for insomnia  The patient was last seen for this 8 months ago. Changes made at last visit include start small supply of low dose Ambien for use 1 week only.  She reports excellent compliance with treatment. She feels that condition is Worse since stopping medication. She is not having side effects. Patient requesting refill of Ambien due to getting up at night to go the bathroom and not being able to fall back asleep.   Patient reports trying OTC Melatonin, reports it does not help.   Every night, getting up to urinate 3-4 times.  Takes hours to fall back to sleep after it.  Does not want to take something for OAB due to CKD. -----------------------------------------------------------------------------------------  Medications: Outpatient Medications Prior to Visit  Medication Sig   amLODipine (NORVASC) 5 MG tablet Take 1 tablet (5 mg total) by mouth daily.   apixaban (ELIQUIS) 5 MG TABS tablet  Take 1 tablet (5 mg total) by mouth 2 (two) times daily.   atorvastatin (LIPITOR) 40 MG tablet Take 1 tablet (40 mg total) by mouth daily.   clopidogrel (PLAVIX) 75 MG tablet Take by mouth.   famotidine (PEPCID) 40 MG tablet Take 1 tablet (40 mg total) by mouth daily.   fentaNYL (DURAGESIC) 50 MCG/HR Place 1 patch onto the skin every 3 (three) days.   pregabalin (LYRICA) 150 MG capsule Take 1 capsule (150 mg total) by mouth 2 (two) times daily.   valsartan (DIOVAN) 160 MG tablet Take 160 mg by mouth in the morning and at bedtime.   [DISCONTINUED] carvedilol (COREG) 6.25 MG tablet Take 6.25 mg by mouth 2 (two) times daily.   No facility-administered medications prior to visit.    Review of Systems  Constitutional:  Negative for appetite change and fatigue.  Respiratory:  Negative for cough and shortness of breath.   Cardiovascular:  Negative for chest pain and leg swelling.  Psychiatric/Behavioral:  Positive for sleep disturbance.        Objective    There were no vitals taken for this visit.     Physical Exam Constitutional:      General: She is not in acute distress.    Appearance: Normal appearance.  HENT:     Head: Normocephalic.  Pulmonary:     Effort: Pulmonary effort is normal. No respiratory distress.  Neurological:     Mental Status: She is alert and oriented to person, place, and time. Mental status is at baseline.  Assessment & Plan     Problem List Items Addressed This Visit       Genitourinary   OAB (overactive bladder)     Other   Insomnia - Primary    Recurrent issue OAB with nocturia seems to be underlying it Patient does not want treatment for OAB due to CKD Discussed risks of longterm Ambien Trial of trazodone 25-50 mg qhs prn Ok to titrate dose in the future if needed        No follow-ups on file.     I discussed the assessment and treatment plan with the patient. The patient was provided an opportunity to ask questions and all  were answered. The patient agreed with the plan and demonstrated an understanding of the instructions.   The patient was advised to call back or seek an in-person evaluation if the symptoms worsen or if the condition fails to improve as anticipated.  I, Shirlee Latch, MD, have reviewed all documentation for this visit. The documentation on 04/27/22 for the exam, diagnosis, procedures, and orders are all accurate and complete.   Viral Schramm, Marzella Schlein, MD, MPH [facility]

## 2022-04-27 ENCOUNTER — Encounter: Payer: Self-pay | Admitting: Family Medicine

## 2022-04-27 ENCOUNTER — Telehealth: Payer: Medicare Other | Admitting: Family Medicine

## 2022-04-27 DIAGNOSIS — N3281 Overactive bladder: Secondary | ICD-10-CM

## 2022-04-27 DIAGNOSIS — G4700 Insomnia, unspecified: Secondary | ICD-10-CM

## 2022-04-27 MED ORDER — TRAZODONE HCL 50 MG PO TABS: 25.0000 mg | ORAL_TABLET | Freq: Every evening | ORAL | 3 refills | Status: DC | PRN

## 2022-04-27 MED ORDER — CARVEDILOL 6.25 MG PO TABS: 6.2500 mg | ORAL_TABLET | Freq: Two times a day (BID) | ORAL | 3 refills | Status: DC

## 2022-04-27 NOTE — Assessment & Plan Note (Signed)
Recurrent issue OAB with nocturia seems to be underlying it Patient does not want treatment for OAB due to CKD Discussed risks of longterm Ambien Trial of trazodone 25-50 mg qhs prn Ok to titrate dose in the future if needed

## 2022-05-01 ENCOUNTER — Ambulatory Visit: Payer: Medicare Other | Admitting: Dermatology

## 2022-05-01 ENCOUNTER — Encounter: Payer: Medicare Other | Admitting: *Deleted

## 2022-05-01 DIAGNOSIS — Z48812 Encounter for surgical aftercare following surgery on the circulatory system: Secondary | ICD-10-CM | POA: Diagnosis not present

## 2022-05-01 DIAGNOSIS — Z955 Presence of coronary angioplasty implant and graft: Secondary | ICD-10-CM | POA: Diagnosis not present

## 2022-05-01 NOTE — Progress Notes (Signed)
Daily Session Note  Patient Details  Name: Dawn Bruce MRN: [id number] Date of Birth: 08/03/1945 Referring Provider:   Flowsheet Row Cardiac Rehab from 04/24/2022 in [facility] Cardiac [facility] Rehab  Referring Provider Rolla Plate       Encounter Date: 05/01/2022  Check In:  Session Check In - 05/01/22 1348       Check-In   Supervising physician immediately available to respond to emergencies See telemetry face sheet for immediately available ER MD    Location [facility]-Cardiac & Pulmonary Rehab    Staff Present Justin Mend, RCP,RRT,BSRT;Aalaysia Liggins Sherryll Burger, RN Odelia Gage, RN, ADN    Virtual Visit No    Medication changes reported     No    Fall or balance concerns reported    No    Warm-up and Cool-down Performed on first and last piece of equipment    Resistance Training Performed Yes    VAD Patient? No    PAD/SET Patient? No      Pain Assessment   Currently in Pain? No/denies                Social History   Tobacco Use  Smoking Status Former   Packs/day: 0.50   Years: 5.00   Total pack years: 2.50   Types: Cigarettes   Quit date: 1990   Years since quitting: 33.9   Passive exposure: Past  Smokeless Tobacco Never  Tobacco Comments   quit around 1990-1995 (?)    Goals Met:  Independence with exercise equipment Exercise tolerated well No report of concerns or symptoms today Strength training completed today  Goals Unmet:  Not Applicable  Comments: First full day of exercise!  Patient was oriented to gym and equipment including functions, settings, policies, and procedures.  Patient's individual exercise prescription and treatment plan were reviewed.  All starting workloads were established based on the results of the 6 minute walk test done at initial orientation visit.  The plan for exercise progression was also introduced and progression will be customized based on patient's performance and goals.     Dr. Emily Filbert is Medical Director for [facility].  Dr. Ottie Glazier is Medical Director for [facility].

## 2022-05-03 ENCOUNTER — Encounter: Payer: Medicare Other | Admitting: *Deleted

## 2022-05-03 DIAGNOSIS — Z955 Presence of coronary angioplasty implant and graft: Secondary | ICD-10-CM

## 2022-05-03 DIAGNOSIS — Z48812 Encounter for surgical aftercare following surgery on the circulatory system: Secondary | ICD-10-CM | POA: Diagnosis not present

## 2022-05-03 NOTE — Progress Notes (Signed)
Daily Session Note  Patient Details  Name: Dawn Bruce MRN: [id number] Date of Birth: 14-Jan-1946 Referring Provider:   Flowsheet Row Cardiac Rehab from 04/24/2022 in [facility] Cardiac and Pulmonary Rehab  Referring Provider Rolla Plate       Encounter Date: 05/03/2022  Check In:  Session Check In - 05/03/22 1354       Check-In   Supervising physician immediately available to respond to emergencies See telemetry face sheet for immediately available ER MD    Location [facility]-Cardiac & Pulmonary Rehab    Staff Present Renita Papa, RN Moises Blood, BS, ACSM CEP, Exercise Physiologist;Kristen Coble, RN,BC,MSN    Virtual Visit No    Medication changes reported     No    Fall or balance concerns reported    No    Warm-up and Cool-down Performed on first and last piece of equipment    Resistance Training Performed Yes    VAD Patient? No    PAD/SET Patient? No      Pain Assessment   Currently in Pain? No/denies                Social History   Tobacco Use  Smoking Status Former   Packs/day: 0.50   Years: 5.00   Total pack years: 2.50   Types: Cigarettes   Quit date: 1990   Years since quitting: 33.9   Passive exposure: Past  Smokeless Tobacco Never  Tobacco Comments   quit around 1990-1995 (?)    Goals Met:  Independence with exercise equipment Exercise tolerated well No report of concerns or symptoms today Strength training completed today  Goals Unmet:  Not Applicable  Comments: Pt able to follow exercise prescription today without complaint.  Will continue to monitor for progression.    Dr. Emily Filbert is Medical Director for [facility].  Dr. Ottie Glazier is Medical Director for [facility].

## 2022-05-04 ENCOUNTER — Other Ambulatory Visit: Payer: Self-pay | Admitting: Family Medicine

## 2022-05-04 ENCOUNTER — Encounter: Payer: Medicare Other | Admitting: *Deleted

## 2022-05-04 DIAGNOSIS — Z48812 Encounter for surgical aftercare following surgery on the circulatory system: Secondary | ICD-10-CM | POA: Diagnosis not present

## 2022-05-04 DIAGNOSIS — Z955 Presence of coronary angioplasty implant and graft: Secondary | ICD-10-CM | POA: Diagnosis not present

## 2022-05-04 DIAGNOSIS — Z1231 Encounter for screening mammogram for malignant neoplasm of breast: Secondary | ICD-10-CM

## 2022-05-04 NOTE — Progress Notes (Signed)
Daily Session Note  Patient Details  Name: Dawn Bruce MRN: [id number] Date of Birth: 08/12/1945 Referring Provider:   Flowsheet Row Cardiac Rehab from 04/24/2022 in [facility] Cardiac and Pulmonary Rehab  Referring Provider Logan       Encounter Date: 05/04/2022  Check In:  Session Check In - 05/04/22 1354       Check-In   Supervising physician immediately available to respond to emergencies See telemetry face sheet for immediately available ER MD    Location [facility]-Cardiac & Pulmonary Rehab    Staff Present Megan Smith, RN, ADN;Joseph Hood, RCP,RRT,BSRT;Jessica Hawkins, MA, RCEP, CCRP, CCET    Virtual Visit No    Medication changes reported     No    Fall or balance concerns reported    No    Warm-up and Cool-down Performed on first and last piece of equipment    Resistance Training Performed Yes    VAD Patient? No    PAD/SET Patient? No      Pain Assessment   Currently in Pain? No/denies                Social History   Tobacco Use  Smoking Status Former   Packs/day: 0.50   Years: 5.00   Total pack years: 2.50   Types: Cigarettes   Quit date: 1990   Years since quitting: 33.9   Passive exposure: Past  Smokeless Tobacco Never  Tobacco Comments   quit around 1990-1995 (?)    Goals Met:  Independence with exercise equipment Exercise tolerated well No report of concerns or symptoms today Strength training completed today  Goals Unmet:  Not Applicable  Comments: Pt able to follow exercise prescription today without complaint.  Will continue to monitor for progression.    Dr. Mark Miller is Medical Director for [facility].  Dr. Fuad Aleskerov is Medical Director for [facility]. 

## 2022-05-08 ENCOUNTER — Encounter: Payer: Medicare Other | Admitting: *Deleted

## 2022-05-08 DIAGNOSIS — Z48812 Encounter for surgical aftercare following surgery on the circulatory system: Secondary | ICD-10-CM | POA: Diagnosis not present

## 2022-05-08 DIAGNOSIS — Z955 Presence of coronary angioplasty implant and graft: Secondary | ICD-10-CM

## 2022-05-08 NOTE — Progress Notes (Signed)
Daily Session Note  Patient Details  Name: Dawn Bruce MRN: [id number] Date of Birth: 02/13/1946 Referring Provider:   Flowsheet Row Cardiac Rehab from 04/24/2022 in [facility] Cardiac and Pulmonary Rehab  Referring Provider Logan       Encounter Date: 05/08/2022  Check In:  Session Check In - 05/08/22 1343       Check-In   Supervising physician immediately available to respond to emergencies See telemetry face sheet for immediately available ER MD    Location [facility]-Cardiac & Pulmonary Rehab    Staff Present Meredith Craven, RN BSN;Megan Smith, RN, ADN;Joseph Hood, RCP,RRT,BSRT    Virtual Visit No    Medication changes reported     No    Fall or balance concerns reported    No    Warm-up and Cool-down Performed on first and last piece of equipment    Resistance Training Performed Yes    VAD Patient? No    PAD/SET Patient? No      Pain Assessment   Currently in Pain? No/denies                Social History   Tobacco Use  Smoking Status Former   Packs/day: 0.50   Years: 5.00   Total pack years: 2.50   Types: Cigarettes   Quit date: 1990   Years since quitting: 33.9   Passive exposure: Past  Smokeless Tobacco Never  Tobacco Comments   quit around 1990-1995 (?)    Goals Met:  Independence with exercise equipment Exercise tolerated well No report of concerns or symptoms today Strength training completed today  Goals Unmet:  Not Applicable  Comments: Pt able to follow exercise prescription today without complaint.  Will continue to monitor for progression.    Dr. Mark Miller is Medical Director for [facility].  Dr. Fuad Aleskerov is Medical Director for [facility]. 

## 2022-05-10 ENCOUNTER — Encounter: Payer: Medicare Other | Admitting: *Deleted

## 2022-05-10 DIAGNOSIS — Z955 Presence of coronary angioplasty implant and graft: Secondary | ICD-10-CM | POA: Diagnosis not present

## 2022-05-10 DIAGNOSIS — Z48812 Encounter for surgical aftercare following surgery on the circulatory system: Secondary | ICD-10-CM | POA: Diagnosis not present

## 2022-05-10 NOTE — Progress Notes (Signed)
Daily Session Note  Patient Details  Name: Dawn Bruce MRN: [id number] Date of Birth: [birth year] Referring Provider:   Flowsheet Row Cardiac Rehab from 04/24/2022 in [facility] Cardiac and Pulmonary Rehab  Referring Provider Rolla Plate       Encounter Date: 05/10/2022  Check In:  Session Check In - 05/10/22 1331       Check-In   Supervising physician immediately available to respond to emergencies See telemetry face sheet for immediately available ER MD    Location [facility]-Cardiac & Pulmonary Rehab    Staff Present Renita Papa, RN BSN;Jessica Barnard, MA, RCEP, CCRP, [place], BS, ACSM CEP, Exercise Physiologist    Virtual Visit No    Medication changes reported     No    Fall or balance concerns reported    No    Warm-up and Cool-down Performed on first and last piece of equipment    Resistance Training Performed Yes    VAD Patient? No    PAD/SET Patient? No      Pain Assessment   Currently in Pain? No/denies                Social History   Tobacco Use  Smoking Status Former   Packs/day: 0.50   Years: 5.00   Total pack years: 2.50   Types: Cigarettes   Quit date: 1990   Years since quitting: 33.9   Passive exposure: Past  Smokeless Tobacco Never  Tobacco Comments   quit around 1990-1995 (?)    Goals Met:  Independence with exercise equipment Exercise tolerated well No report of concerns or symptoms today Strength training completed today  Goals Unmet:  Not Applicable  Comments: Pt able to follow exercise prescription today without complaint.  Will continue to monitor for progression.  Reviewed home exercise with pt today.  Pt plans to walk and use gym at clubhouse for exercise.  Reviewed THR, pulse, RPE, sign and symptoms, pulse oximetery and when to call 911 or MD.  Also discussed weather considerations and indoor options.  Pt voiced understanding.   Dr. Emily Filbert is Medical Director for [place].  Dr. Ottie Glazier  is Medical Director for [facility] Pulmonary Rehabilitation.

## 2022-05-11 ENCOUNTER — Encounter: Payer: Medicare Other | Admitting: *Deleted

## 2022-05-11 DIAGNOSIS — Z48812 Encounter for surgical aftercare following surgery on the circulatory system: Secondary | ICD-10-CM | POA: Diagnosis not present

## 2022-05-11 DIAGNOSIS — Z955 Presence of coronary angioplasty implant and graft: Secondary | ICD-10-CM | POA: Diagnosis not present

## 2022-05-11 NOTE — Progress Notes (Signed)
Daily Session Note  Patient Details  Name: Dawn Bruce MRN: [id number] Date of Birth: May 10, 1946 Referring Provider:   Flowsheet Row Cardiac Rehab from 04/24/2022 in [facility] Cardiac and Pulmonary Rehab  Referring Provider Rolla Plate       Encounter Date: 05/11/2022  Check In:  Session Check In - 05/11/22 1419       Check-In   Supervising physician immediately available to respond to emergencies See telemetry face sheet for immediately available ER MD    Location [facility]-Cardiac & Pulmonary Rehab    Staff Present Heath Lark, RN, BSN, CCRP;Meredith Sherryll Burger, RN BSN;Joseph [place], [place]    Virtual Visit No    Medication changes reported     No    Fall or balance concerns reported    No    Warm-up and Cool-down Performed on first and last piece of equipment    Resistance Training Performed Yes    VAD Patient? No    PAD/SET Patient? No      Pain Assessment   Currently in Pain? No/denies                Social History   Tobacco Use  Smoking Status Former   Packs/day: 0.50   Years: 5.00   Total pack years: 2.50   Types: Cigarettes   Quit date: 1990   Years since quitting: 33.9   Passive exposure: Past  Smokeless Tobacco Never  Tobacco Comments   quit around 1990-1995 (?)    Goals Met:  Independence with exercise equipment Exercise tolerated well No report of concerns or symptoms today  Goals Unmet:  Not Applicable  Comments: Pt able to follow exercise prescription today without complaint.  Will continue to monitor for progression.    Dr. Emily Filbert is Medical Director for [place].  Dr. Ottie Glazier is Medical Director for [facility] Pulmonary Rehabilitation.

## 2022-05-17 ENCOUNTER — Encounter: Payer: Self-pay | Admitting: *Deleted

## 2022-05-17 ENCOUNTER — Encounter: Payer: Medicare Other | Admitting: *Deleted

## 2022-05-17 DIAGNOSIS — Z48812 Encounter for surgical aftercare following surgery on the circulatory system: Secondary | ICD-10-CM | POA: Diagnosis not present

## 2022-05-17 DIAGNOSIS — Z955 Presence of coronary angioplasty implant and graft: Secondary | ICD-10-CM | POA: Diagnosis not present

## 2022-05-17 NOTE — Progress Notes (Signed)
Cardiac Individual Treatment Plan  Patient Details  Name: Dawn Bruce MRN: [id number] Date of Birth: 1946/05/04 Referring Provider:   Flowsheet Row Cardiac Rehab from 04/24/2022 in [facility] Cardiac and Pulmonary Rehab  Referring Provider Rolla Plate       Initial Encounter Date:  Flowsheet Row Cardiac Rehab from 04/24/2022 in [facility] Cardiac and Pulmonary Rehab  Date 04/24/22       Visit Diagnosis: Status post coronary artery stent placement  Patient's Home Medications on Admission:  Current Outpatient Medications:    amLODipine (NORVASC) 5 MG tablet, Take 1 tablet (5 mg total) by mouth daily., Disp: 90 tablet, Rfl: 1   apixaban (ELIQUIS) 5 MG TABS tablet, Take 1 tablet (5 mg total) by mouth 2 (two) times daily., Disp: 60 tablet, Rfl: 5   atorvastatin (LIPITOR) 40 MG tablet, Take 1 tablet (40 mg total) by mouth daily., Disp: 90 tablet, Rfl: 1   carvedilol (COREG) 6.25 MG tablet, Take 1 tablet (6.25 mg total) by mouth 2 (two) times daily., Disp: 180 tablet, Rfl: 3   clopidogrel (PLAVIX) 75 MG tablet, Take by mouth., Disp: , Rfl:    famotidine (PEPCID) 40 MG tablet, Take 1 tablet (40 mg total) by mouth daily., Disp: 90 tablet, Rfl: 0   fentaNYL (DURAGESIC) 50 MCG/HR, Place 1 patch onto the skin every 3 (three) days., Disp: 10 patch, Rfl: 0   pregabalin (LYRICA) 150 MG capsule, Take 1 capsule (150 mg total) by mouth 2 (two) times daily., Disp: 180 capsule, Rfl: 0   traZODone (DESYREL) 50 MG tablet, Take 0.5-1 tablets (25-50 mg total) by mouth at bedtime as needed for sleep., Disp: 30 tablet, Rfl: 3   valsartan (DIOVAN) 160 MG tablet, Take 160 mg by mouth in the morning and at bedtime., Disp: , Rfl:   Past Medical History: Past Medical History:  Diagnosis Date   Actinic keratosis    Chronic pain    History of basal cell carcinoma (BCC) 05/02/2021   right upper back paraspinal   History of SCC (squamous cell carcinoma) of skin 09/10/2019   left dorsum wrist ED&C done 10/28/19   History of SCC  (squamous cell carcinoma) of skin 03/04/2020   right dorsum hand   History of squamous cell carcinoma in situ (SCCIS) 03/04/2020   left dorsum wrist  ED&C   History of squamous cell carcinoma in situ (SCCIS) 03/04/2020   right medial infraorbital  ED&C 04/28/2020   History of squamous cell carcinoma in situ (SCCIS) 10/28/2019   right dorsum hand proximal lateral   History of squamous cell carcinoma in situ (SCCIS) 10/28/2019   right dorsum hand proximal medial   Hyperlipidemia    Hypertension    Renal disorder    Squamous cell carcinoma in situ 10/31/2021   left distal  tricep, EDC    Tobacco Use: Social History   Tobacco Use  Smoking Status Former   Packs/day: 0.50   Years: 5.00   Total pack years: 2.50   Types: Cigarettes   Quit date: 1990   Years since quitting: 34.0   Passive exposure: Past  Smokeless Tobacco Never  Tobacco Comments   quit around 1990-1995 (?)    Labs: Review Flowsheet  More data exists      Latest Ref Rng & Units 04/20/2020 12/03/2020 08/31/2021 10/25/2021 12/12/2021  Labs for ITP Cardiac and Pulmonary Rehab  Cholestrol 100 - 199 mg/dL 178  - - 201  -  LDL (calc) 0 - 99 mg/dL 102  - - 108  -  HDL-C >39 mg/dL 61  - - 71  -  Trlycerides 0 - 149 mg/dL 79  - - 125  -  Hemoglobin A1c 4.8 - 5.6 % 5.9  6.0  6.5  - 5.5      Exercise Target Goals: Exercise Program Goal: Individual exercise prescription set using results from initial 6 min walk test and THRR while considering  patient's activity barriers and safety.   Exercise Prescription Goal: Initial exercise prescription builds to 30-45 minutes a day of aerobic activity, 2-3 days per week.  Home exercise guidelines will be given to patient during program as part of exercise prescription that the participant will acknowledge.   Education: Aerobic Exercise: - Group verbal and visual presentation on the components of exercise prescription. Introduces F.I.T.T principle from ACSM for exercise  prescriptions.  Reviews F.I.T.T. principles of aerobic exercise including progression. Written material given at graduation.   Education: Resistance Exercise: - Group verbal and visual presentation on the components of exercise prescription. Introduces F.I.T.T principle from ACSM for exercise prescriptions  Reviews F.I.T.T. principles of resistance exercise including progression. Written material given at graduation.    Education: Exercise & Equipment Safety: - Individual verbal instruction and demonstration of equipment use and safety with use of the equipment. Flowsheet Row Cardiac Rehab from 05/10/2022 in [facility] Cardiac and Pulmonary Rehab  Date 04/24/22  Educator [facility]  Instruction Review Code 1- Verbalizes Understanding       Education: Exercise Physiology & General Exercise Guidelines: - Group verbal and written instruction with models to review the exercise physiology of the cardiovascular system and associated critical values. Provides general exercise guidelines with specific guidelines to those with heart or lung disease.  Flowsheet Row Cardiac Rehab from 05/10/2022 in [facility] Cardiac and Pulmonary Rehab  Education need identified 04/24/22       Education: Flexibility, Balance, Mind/Body Relaxation: - Group verbal and visual presentation with interactive activity on the components of exercise prescription. Introduces F.I.T.T principle from ACSM for exercise prescriptions. Reviews F.I.T.T. principles of flexibility and balance exercise training including progression. Also discusses the mind body connection.  Reviews various relaxation techniques to help reduce and manage stress (i.e. Deep breathing, progressive muscle relaxation, and visualization). Balance handout provided to take home. Written material given at graduation.   Activity Barriers & Risk Stratification:  Activity Barriers & Cardiac Risk Stratification - 04/24/22 1648       Activity Barriers & Cardiac Risk  Stratification   Activity Barriers Other (comment)    Comments nerve pain from horse back riding accident in left hand             6 Minute Walk:  6 Minute Walk     Row Name 04/24/22 1644         6 Minute Walk   Phase Initial     Distance 1290 feet     Walk Time 6 minutes     # of Rest Breaks 0     MPH 2.44     METS 2.55     RPE 8     Perceived Dyspnea  0     VO2 Peak 8.93     Symptoms No     Resting HR 63 bpm     Resting BP 112/72     Resting Oxygen Saturation  99 %     Exercise Oxygen Saturation  during 6 min walk 98 %     Max Ex. HR 83 bpm     Max Ex. BP 142/80  2 Minute Post BP 122/80              Oxygen Initial Assessment:   Oxygen Re-Evaluation:   Oxygen Discharge (Final Oxygen Re-Evaluation):   Initial Exercise Prescription:  Initial Exercise Prescription - 04/24/22 1600       Date of Initial Exercise RX and Referring Provider   Date 04/24/22    Referring Provider Rolla Plate      Oxygen   Maintain Oxygen Saturation 88% or higher      Treadmill   MPH 2    Grade 1    Minutes 15    METs 2.81      Recumbant Bike   Level 2    RPM 50    Minutes 15    METs 2.55      REL-XR   Level 2    Speed 50    Minutes 15    METs 2.55      T5 Nustep   Level 2    SPM 80    Minutes 15    METs 2.55      Track   Laps 35    Minutes 15    METs 2.9      Prescription Details   Frequency (times per week) 3    Duration Progress to 30 minutes of continuous aerobic without signs/symptoms of physical distress      Intensity   THRR 40-80% of Max Heartrate 94-126    Ratings of Perceived Exertion 11-13    Perceived Dyspnea 0-4      Progression   Progression Continue to progress workloads to maintain intensity without signs/symptoms of physical distress.      Resistance Training   Training Prescription Yes    Weight 3    Reps 10-15             Perform Capillary Blood Glucose checks as needed.  Exercise Prescription Changes:    Exercise Prescription Changes     Row Name 04/24/22 1600 05/10/22 1100 05/10/22 1400         Response to Exercise   Blood Pressure (Admit) 112/72 118/60 --     Blood Pressure (Exercise) 142/80 128/64 --     Blood Pressure (Exit) 122/80 106/64 --     Heart Rate (Admit) 63 bpm 73 bpm --     Heart Rate (Exercise) 83 bpm 105 bpm --     Heart Rate (Exit) 63 bpm 80 bpm --     Oxygen Saturation (Admit) 99 % -- --     Oxygen Saturation (Exercise) 98 % -- --     Oxygen Saturation (Exit) 99 % -- --     Rating of Perceived Exertion (Exercise) 8 13 --     Perceived Dyspnea (Exercise) 0 -- --     Symptoms none none --     Comments 6 MWT results 3rd full session of exercise --     Duration -- Progress to 30 minutes of  aerobic without signs/symptoms of physical distress --     Intensity -- THRR unchanged --       Progression   Progression -- Continue to progress workloads to maintain intensity without signs/symptoms of physical distress. --     Average METs -- 2.7 --       Resistance Training   Training Prescription -- Yes --     Weight -- 3 lb --     Reps -- 10-15 --       Interval Training  Interval Training -- No --       Treadmill   MPH -- 2.2 --     Grade -- 1.5 --     Minutes -- 15 --     METs -- 3.14 --       Recumbant Bike   Level -- 5 --     Watts -- 27 --     Minutes -- 15 --     METs -- 3.4 --       Home Exercise Plan   Plans to continue exercise at -- -- Home (comment)  walking, clubhouse gym     Frequency -- -- Add 3 additional days to program exercise sessions.     Initial Home Exercises Provided -- -- 05/10/22       Oxygen   Maintain Oxygen Saturation -- 88% or higher --              Exercise Comments:   Exercise Comments     Row Name 05/01/22 1349           Exercise Comments First full day of exercise!  Patient was oriented to gym and equipment including functions, settings, policies, and procedures.  Patient's individual exercise prescription  and treatment plan were reviewed.  All starting workloads were established based on the results of the 6 minute walk test done at initial orientation visit.  The plan for exercise progression was also introduced and progression will be customized based on patient's performance and goals.                Exercise Goals and Review:   Exercise Goals     Row Name 04/24/22 1655             Exercise Goals   Increase Physical Activity Yes       Intervention Provide advice, education, support and counseling about physical activity/exercise needs.;Develop an individualized exercise prescription for aerobic and resistive training based on initial evaluation findings, risk stratification, comorbidities and participant's personal goals.       Expected Outcomes Short Term: Attend rehab on a regular basis to increase amount of physical activity.;Long Term: Exercising regularly at least 3-5 days a week.;Long Term: Add in home exercise to make exercise part of routine and to increase amount of physical activity.       Increase Strength and Stamina Yes       Intervention Provide advice, education, support and counseling about physical activity/exercise needs.;Develop an individualized exercise prescription for aerobic and resistive training based on initial evaluation findings, risk stratification, comorbidities and participant's personal goals.       Expected Outcomes Short Term: Increase workloads from initial exercise prescription for resistance, speed, and METs.;Short Term: Perform resistance training exercises routinely during rehab and add in resistance training at home;Long Term: Improve cardiorespiratory fitness, muscular endurance and strength as measured by increased METs and functional capacity (6MWT)       Able to understand and use rate of perceived exertion (RPE) scale Yes       Intervention Provide education and explanation on how to use RPE scale       Expected Outcomes Short Term: Able to  use RPE daily in rehab to express subjective intensity level;Long Term:  Able to use RPE to guide intensity level when exercising independently       Able to understand and use Dyspnea scale Yes       Intervention Provide education and explanation on how to use Dyspnea scale  Expected Outcomes Short Term: Able to use Dyspnea scale daily in rehab to express subjective sense of shortness of breath during exertion;Long Term: Able to use Dyspnea scale to guide intensity level when exercising independently       Knowledge and understanding of Target Heart Rate Range (THRR) Yes       Intervention Provide education and explanation of THRR including how the numbers were predicted and where they are located for reference       Expected Outcomes Short Term: Able to state/look up THRR;Long Term: Able to use THRR to govern intensity when exercising independently;Short Term: Able to use daily as guideline for intensity in rehab       Able to check pulse independently Yes       Intervention Provide education and demonstration on how to check pulse in carotid and radial arteries.;Review the importance of being able to check your own pulse for safety during independent exercise       Expected Outcomes Short Term: Able to explain why pulse checking is important during independent exercise       Understanding of Exercise Prescription Yes       Intervention Provide education, explanation, and written materials on patient's individual exercise prescription       Expected Outcomes Short Term: Able to explain program exercise prescription;Long Term: Able to explain home exercise prescription to exercise independently                Exercise Goals Re-Evaluation :  Exercise Goals Re-Evaluation     Row Name 05/01/22 1349 05/10/22 1146 05/10/22 1432         Exercise Goal Re-Evaluation   Exercise Goals Review Increase Physical Activity;Able to understand and use rate of perceived exertion (RPE) scale;Knowledge  and understanding of Target Heart Rate Range (THRR);Understanding of Exercise Prescription;Increase Strength and Stamina;Able to check pulse independently Increase Physical Activity;Understanding of Exercise Prescription;Increase Strength and Stamina Increase Physical Activity;Understanding of Exercise Prescription;Increase Strength and Stamina;Able to understand and use rate of perceived exertion (RPE) scale;Knowledge and understanding of Target Heart Rate Range (THRR);Able to understand and use Dyspnea scale;Able to check pulse independently     Comments Reviewed RPE scale, THR and program prescription with pt today.  Pt voiced understanding and was given a copy of goals to take home. Alaiya is off to a good start with rehab. She has been able to increase beyond her initial exercise prescription and is already up to level 5 on the recumbent bike and working over 3 METs on the treadmill. We will continue to monitor as she progresses in the program. Reviewed home exercise with pt today.  Pt plans to walk and use gym at clubhouse for exercise.  Reviewed THR, pulse, RPE, sign and symptoms, pulse oximetery and when to call 911 or MD.  Also discussed weather considerations and indoor options.  Pt voiced understanding.     Expected Outcomes Short: Use RPE daily to regulate intensity.  Long: Follow program prescription in THR. Short: Continue current exercise prescription and maintain good attendance Long: Build up overall strength and stamina Short: Start going to clubhouse for exercise on off days Long:Conitnue to improve stamina              Discharge Exercise Prescription (Final Exercise Prescription Changes):  Exercise Prescription Changes - 05/10/22 1400       Home Exercise Plan   Plans to continue exercise at Home (comment)   walking, clubhouse gym   Frequency Add 3 additional days  to program exercise sessions.    Initial Home Exercises Provided 05/10/22             Nutrition:  Target Goals:  Understanding of nutrition guidelines, daily intake of sodium '1500mg'$ , cholesterol '200mg'$ , calories 30% from fat and 7% or less from saturated fats, daily to have 5 or more servings of fruits and vegetables.  Education: All About Nutrition: -Group instruction provided by verbal, written material, interactive activities, discussions, models, and posters to present general guidelines for heart healthy nutrition including fat, fiber, MyPlate, the role of sodium in heart healthy nutrition, utilization of the nutrition label, and utilization of this knowledge for meal planning. Follow up email sent as well. Written material given at graduation. Flowsheet Row Cardiac Rehab from 05/10/2022 in [facility] Cardiac and Pulmonary Rehab  Education need identified 04/24/22       Biometrics:  Pre Biometrics - 04/24/22 1656       Pre Biometrics   Height 5' 3.5" (1.613 m)    Weight 127 lb 14.4 oz (58 kg)    BMI (Calculated) 22.3    Single Leg Stand 7.07 seconds              Nutrition Therapy Plan and Nutrition Goals:  Nutrition Therapy & Goals - 04/24/22 1427       Nutrition Therapy   Diet Heart healthy, low Na    Drug/Food Interactions Statins/Certain Fruits    Protein (specify units) 65-75g    Fiber 25 grams    Whole Grain Foods 3 servings    Saturated Fats 12 max. grams    Fruits and Vegetables 8 servings/day    Sodium 2 grams      Personal Nutrition Goals   Nutrition Goal ST: practice MyPlate guidelines, include 1 additional whole grain into routine, read food labels LT: limit sodium <2g/day, follow MyPlate guidelines    Comments 76 y.o. F admitted to cardiac rehab s/p stent placement. 21 standard drinks per week. PMHx chronic systolic HF, a.fib, HTN, HLD, previous tobacco use, skin cancer, CKD stg 3, GERD. Relevant medications lipitor, fentanyl, pepcid, lyrica.  PYP Score: Vegetables & Fruits 8/12. Breads, Grains & Cereals 4/12. Red & Processed Meat 8/12. Poultry 2/2. Fish & Shellfish 0/4.  Beans, Nuts & Seeds 1/4. Milk & Dairy Foods 4/6. Toppings, Oils, Seasonings & Salt 10/20. Sweets, Snacks & Restaurant Food 10/14. Beverages 6/10. B: coffee (cream or half and half and 2 stevia packets) with grapes L: soups (canned) or grilled sandwich (tuna or ham and cheese on white bread) or leftovers. Sometimes will go to [employer] and get vegetables, hamburgers (1x/week) and hot dogs (every coupl of weeks) D: meat (chicken, pork or beef), starch, and non-starchy vegetables. Johneisha reports they will eat more refined grains and smaller amounts of wheat products. She reports salting food while cooking. She cooks with butter. Drinks: water, 1/2 diet pepsi can at lunch. Alcohol: 2 drinks before dinner. Marlys reports eating lots of fresh fruits and vegetables over the summer from the farmers market. Discussed heart healthy eating; encouraged include whole grains, following MyPlate structure, and managing sodium by reading labels.      Intervention Plan   Intervention Prescribe, educate and counsel regarding individualized specific dietary modifications aiming towards targeted core components such as weight, hypertension, lipid management, diabetes, heart failure and other comorbidities.;Nutrition handout(s) given to patient.    Expected Outcomes Short Term Goal: Understand basic principles of dietary content, such as calories, fat, sodium, cholesterol and nutrients.;Short Term Goal: A plan  has been developed with personal nutrition goals set during dietitian appointment.;Long Term Goal: Adherence to prescribed nutrition plan.             Nutrition Assessments:  MEDIFICTS Score Key: ?70 Need to make dietary changes  40-70 Heart Healthy Diet ? 40 Therapeutic Level Cholesterol Diet   Picture Your Plate Scores: <74 Unhealthy dietary pattern with much room for improvement. 41-50 Dietary pattern unlikely to meet recommendations for good health and room for improvement. 51-60 More healthful dietary  pattern, with some room for improvement.  >60 Healthy dietary pattern, although there may be some specific behaviors that could be improved.    Nutrition Goals Re-Evaluation:   Nutrition Goals Discharge (Final Nutrition Goals Re-Evaluation):   Psychosocial: Target Goals: Acknowledge presence or absence of significant depression and/or stress, maximize coping skills, provide positive support system. Participant is able to verbalize types and ability to use techniques and skills needed for reducing stress and depression.   Education: Stress, Anxiety, and Depression - Group verbal and visual presentation to define topics covered.  Reviews how body is impacted by stress, anxiety, and depression.  Also discusses healthy ways to reduce stress and to treat/manage anxiety and depression.  Written material given at graduation. Flowsheet Row Cardiac Rehab from 05/10/2022 in [facility] Cardiac and Pulmonary Rehab  Education need identified 04/24/22       Education: Sleep Hygiene -Provides group verbal and written instruction about how sleep can affect your health.  Define sleep hygiene, discuss sleep cycles and impact of sleep habits. Review good sleep hygiene tips.    Initial Review & Psychosocial Screening:  Initial Psych Review & Screening - 04/10/22 1112       Initial Review   Current issues with None Identified      Family Dynamics   Good Support System? Yes   husband, daughter , granddaughter     Barriers   Psychosocial barriers to participate in program There are no identifiable barriers or psychosocial needs.      Screening Interventions   Interventions To provide support and resources with identified psychosocial needs;Provide feedback about the scores to participant    Expected Outcomes Short Term goal: Utilizing psychosocial counselor, staff and physician to assist with identification of specific Stressors or current issues interfering with healing process. Setting desired goal for  each stressor or current issue identified.;Long Term Goal: Stressors or current issues are controlled or eliminated.;Short Term goal: Identification and review with participant of any Quality of Life or Depression concerns found by scoring the questionnaire.;Long Term goal: The participant improves quality of Life and PHQ9 Scores as seen by post scores and/or verbalization of changes             Quality of Life Scores:   Quality of Life - 04/24/22 1656       Quality of Life   Select Quality of Life      Quality of Life Scores   Health/Function Pre 23.2 %    Socioeconomic Pre 24.06 %    Psych/Spiritual Pre 27.43 %    Family Pre 25.2 %    GLOBAL Pre 24.53 %            Scores of 19 and below usually indicate a poorer quality of life in these areas.  A difference of  2-3 points is a clinically meaningful difference.  A difference of 2-3 points in the total score of the Quality of Life Index has been associated with significant improvement in overall quality of  life, self-image, physical symptoms, and general health in studies assessing change in quality of life.  PHQ-9: Review Flowsheet  More data exists      04/24/2022 12/12/2021 09/27/2021 07/04/2021 05/10/2021  Depression screen PHQ 2/9  Decreased Interest 0 0 0 0 0  Down, Depressed, Hopeless 0 0 0 0 0  PHQ - 2 Score 0 0 0 0 0  Altered sleeping 1 0 0 0 0  Tired, decreased energy 0 0 0 0 0  Change in appetite 0 0 0 0 0  Feeling bad or failure about yourself  0 0 0 0 0  Trouble concentrating 0 0 0 0 0  Moving slowly or fidgety/restless 0 0 0 0 0  Suicidal thoughts 0 0 0 0 0  PHQ-9 Score 1 0 0 0 0  Difficult doing work/chores Not difficult at all Not difficult at all Not difficult at all Not difficult at all Not difficult at all   Interpretation of Total Score  Total Score Depression Severity:  1-4 = Minimal depression, 5-9 = Mild depression, 10-14 = Moderate depression, 15-19 = Moderately severe depression, 20-27 = Severe  depression   Psychosocial Evaluation and Intervention:  Psychosocial Evaluation - 04/10/22 1133       Psychosocial Evaluation & Interventions   Interventions Encouraged to exercise with the program and follow exercise prescription    Comments Shaely has no barriers to attending the program. She lives with her husband and they have a [place] dog we ever had".  Her husband and her daughter are her support. She hopes to improve her stamina and energy and get back into an exercise routine. She is ready to start the program    Expected Outcomes STG Alyssia attends all scheduled sessions, she is able to progress with her exercise prescription. LTG Dyneshia continues  her  exercise progression after discharge.    Continue Psychosocial Services  Follow up required by staff             Psychosocial Re-Evaluation:   Psychosocial Discharge (Final Psychosocial Re-Evaluation):   Vocational Rehabilitation: Provide vocational rehab assistance to qualifying candidates.   Vocational Rehab Evaluation & Intervention:  Vocational Rehab - 04/10/22 1118       Initial Vocational Rehab Evaluation & Intervention   Assessment shows need for Vocational Rehabilitation No      Vocational Rehab Re-Evaulation   Comments retired             Education: Education Goals: Education classes will be provided on a variety of topics geared toward better understanding of heart health and risk factor modification. Participant will state understanding/return demonstration of topics presented as noted by education test scores.  Learning Barriers/Preferences:  Learning Barriers/Preferences - 04/10/22 1118       Learning Barriers/Preferences   Learning Barriers None    Learning Preferences None             General Cardiac Education Topics:  AED/CPR: - Group verbal and written instruction with the use of models to demonstrate the basic use of the AED with the basic ABC's of resuscitation.   Anatomy and  Cardiac Procedures: - Group verbal and visual presentation and models provide information about basic cardiac anatomy and function. Reviews the testing methods done to diagnose heart disease and the outcomes of the test results. Describes the treatment choices: Medical Management, Angioplasty, or Coronary Bypass Surgery for treating various heart conditions including Myocardial Infarction, Angina, Valve Disease, and Cardiac Arrhythmias.  Written material given at graduation.  Medication Safety: - Group verbal and visual instruction to review commonly prescribed medications for heart and lung disease. Reviews the medication, class of the drug, and side effects. Includes the steps to properly store meds and maintain the prescription regimen.  Written material given at graduation. Flowsheet Row Cardiac Rehab from 05/10/2022 in [facility] Cardiac and Pulmonary Rehab  Date 05/03/22  Educator SB  Instruction Review Code 1- Verbalizes Understanding       Intimacy: - Group verbal instruction through game format to discuss how heart and lung disease can affect sexual intimacy. Written material given at graduation..   Know Your Numbers and Heart Failure: - Group verbal and visual instruction to discuss disease risk factors for cardiac and pulmonary disease and treatment options.  Reviews associated critical values for Overweight/Obesity, Hypertension, Cholesterol, and Diabetes.  Discusses basics of heart failure: signs/symptoms and treatments.  Introduces Heart Failure Zone chart for action plan for heart failure.  Written material given at graduation. Flowsheet Row Cardiac Rehab from 05/10/2022 in [facility] Cardiac and Pulmonary Rehab  Date 05/10/22  Educator SB  Instruction Review Code 1- Verbalizes Understanding       Infection Prevention: - Provides verbal and written material to individual with discussion of infection control including proper hand washing and proper equipment cleaning during exercise  session. Flowsheet Row Cardiac Rehab from 05/10/2022 in [facility] Cardiac and Pulmonary Rehab  Date 04/24/22  Educator [facility]  Instruction Review Code 1- Verbalizes Understanding       Falls Prevention: - Provides verbal and written material to individual with discussion of falls prevention and safety. Flowsheet Row Cardiac Rehab from 05/10/2022 in [facility] Cardiac and Pulmonary Rehab  Date 04/24/22  Educator [facility]  Instruction Review Code 1- Verbalizes Understanding       Other: -Provides group and verbal instruction on various topics (see comments)   Knowledge Questionnaire Score:  Knowledge Questionnaire Score - 04/24/22 1657       Knowledge Questionnaire Score   Pre Score 20/26             Core Components/Risk Factors/Patient Goals at Admission:  Personal Goals and Risk Factors at Admission - 04/24/22 1657       Core Components/Risk Factors/Patient Goals on Admission    Weight Management Yes    Intervention Weight Management: Provide education and appropriate resources to help participant work on and attain dietary goals.;Weight Management: Develop a combined nutrition and exercise program designed to reach desired caloric intake, while maintaining appropriate intake of nutrient and fiber, sodium and fats, and appropriate energy expenditure required for the weight goal.    Admit Weight 127 lb 14.4 oz (58 kg)    Goal Weight: Short Term 127 lb (57.6 kg)    Goal Weight: Long Term 127 lb (57.6 kg)    Expected Outcomes Short Term: Continue to assess and modify interventions until short term weight is achieved;Long Term: Adherence to nutrition and physical activity/exercise program aimed toward attainment of established weight goal;Weight Maintenance: Understanding of the daily nutrition guidelines, which includes 25-35% calories from fat, 7% or less cal from saturated fats, less than '200mg'$  cholesterol, less than 1.5gm of sodium, & 5 or more servings of fruits and vegetables  daily;Understanding recommendations for meals to include 15-35% energy as protein, 25-35% energy from fat, 35-60% energy from carbohydrates, less than '200mg'$  of dietary cholesterol, 20-35 gm of total fiber daily;Understanding of distribution of calorie intake throughout the day with the consumption of 4-5 meals/snacks    Hypertension Yes    Intervention  Provide education on lifestyle modifcations including regular physical activity/exercise, weight management, moderate sodium restriction and increased consumption of fresh fruit, vegetables, and low fat dairy, alcohol moderation, and smoking cessation.;Monitor prescription use compliance.    Expected Outcomes Short Term: Continued assessment and intervention until BP is < 140/76m HG in hypertensive participants. < 130/823mHG in hypertensive participants with diabetes, heart failure or chronic kidney disease.;Long Term: Maintenance of blood pressure at goal levels.    Lipids Yes    Intervention Provide education and support for participant on nutrition & aerobic/resistive exercise along with prescribed medications to achieve LDL '70mg'$ , HDL >'40mg'$ .    Expected Outcomes Short Term: Participant states understanding of desired cholesterol values and is compliant with medications prescribed. Participant is following exercise prescription and nutrition guidelines.;Long Term: Cholesterol controlled with medications as prescribed, with individualized exercise RX and with personalized nutrition plan. Value goals: LDL < '70mg'$ , HDL > 40 mg.             Education:Diabetes - Individual verbal and written instruction to review signs/symptoms of diabetes, desired ranges of glucose level fasting, after meals and with exercise. Acknowledge that pre and post exercise glucose checks will be done for 3 sessions at entry of program.   Core Components/Risk Factors/Patient Goals Review:    Core Components/Risk Factors/Patient Goals at Discharge (Final Review):    ITP  Comments:  ITP Comments     Row Name 04/10/22 1137 04/24/22 1501 05/01/22 1348 05/17/22 1134     ITP Comments Virtual orientation call completed today. shehas an appointment on Date: 04/24/2022  for EP eval and gym Orientation.  [facility] . Completed 6MWT and gym orientation. Initial ITP created and sent for review to Dr. MaEmily FilbertMedical Director. First full day of exercise!  Patient was oriented to gym and equipment including functions, settings, policies, and procedures.  Patient's individual exercise prescription and treatment plan were reviewed.  All starting workloads were established based on the results of the 6 minute walk test done at initial orientation visit.  The plan for exercise progression was also introduced and progression will be customized based on patient's performance and goals. 30 Day review completed. Medical Director ITP review done, changes made as directed, and signed approval by Medical Director.    new to program             Comments:

## 2022-05-17 NOTE — Progress Notes (Signed)
Daily Session Note  Patient Details  Name: Dawn Bruce MRN: [id number] Date of Birth: 08/10/1945 Referring Provider:   Flowsheet Row Cardiac Rehab from 04/24/2022 in [facility] Cardiac and Pulmonary Rehab  Referring Provider [place]       Encounter Date: 05/17/2022  Check In:  Session Check In - 05/17/22 1339       Check-In   Supervising physician immediately available to respond to emergencies See telemetry face sheet for immediately available ER MD    Location [facility]-Cardiac & Pulmonary Rehab    Staff Present Meredith Craven, RN BSN;Kristen Coble, RN,BC,MSN;Megan Smith, RN, ADN    Virtual Visit No    Medication changes reported     No    Fall or balance concerns reported    No    Warm-up and Cool-down Performed on first and last piece of equipment    Resistance Training Performed Yes    VAD Patient? No    PAD/SET Patient? No      Pain Assessment   Currently in Pain? No/denies                Social History   Tobacco Use  Smoking Status Former   Packs/day: 0.50   Years: 5.00   Total pack years: 2.50   Types: Cigarettes   Quit date: 1990   Years since quitting: 34.0   Passive exposure: Past  Smokeless Tobacco Never  Tobacco Comments   quit around 1990-1995 (?)    Goals Met:  Independence with exercise equipment Exercise tolerated well No report of concerns or symptoms today Strength training completed today  Goals Unmet:  Not Applicable  Comments: Pt able to follow exercise prescription today without complaint.  Will continue to monitor for progression.    Dr. Mark Miller is Medical Director for [facility].  Dr. Fuad Aleskerov is Medical Director for [facility]. 

## 2022-05-18 ENCOUNTER — Encounter: Payer: Medicare Other | Admitting: *Deleted

## 2022-05-18 DIAGNOSIS — Z955 Presence of coronary angioplasty implant and graft: Secondary | ICD-10-CM | POA: Diagnosis not present

## 2022-05-18 DIAGNOSIS — Z48812 Encounter for surgical aftercare following surgery on the circulatory system: Secondary | ICD-10-CM | POA: Diagnosis not present

## 2022-05-18 NOTE — Progress Notes (Signed)
Daily Session Note  Patient Details  Name: Dawn Bruce MRN: [id number] Date of Birth: [birth year] Referring Provider:   Flowsheet Row Cardiac Rehab from 04/24/2022 in [facility] Cardiac and Pulmonary Rehab  Referring Provider Rolla Plate       Encounter Date: 05/18/2022  Check In:  Session Check In - 05/18/22 1354       Check-In   Supervising physician immediately available to respond to emergencies See telemetry face sheet for immediately available ER MD    Location [facility]-Cardiac & Pulmonary Rehab    Staff Present Darlyne Russian, RN, ADN;Jessica Luan Pulling, MA, RCEP, CCRP, CCET;Joseph Grayson, [place]    Virtual Visit No    Medication changes reported     No    Fall or balance concerns reported    No    Warm-up and Cool-down Performed on first and last piece of equipment    Resistance Training Performed Yes    VAD Patient? No    PAD/SET Patient? No      Pain Assessment   Currently in Pain? No/denies                Social History   Tobacco Use  Smoking Status Former   Packs/day: 0.50   Years: 5.00   Total pack years: 2.50   Types: Cigarettes   Quit date: 1990   Years since quitting: 34.0   Passive exposure: Past  Smokeless Tobacco Never  Tobacco Comments   quit around 1990-1995 (?)    Goals Met:  Independence with exercise equipment Exercise tolerated well No report of concerns or symptoms today Strength training completed today  Goals Unmet:  Not Applicable  Comments: Pt able to follow exercise prescription today without complaint.  Will continue to monitor for progression.    Dr. Emily Filbert is Medical Director for [facility].  Dr. Ottie Glazier is Medical Director for [facility] Pulmonary Rehabilitation.

## 2022-05-29 ENCOUNTER — Encounter: Payer: Medicare Other | Attending: Cardiology | Admitting: *Deleted

## 2022-05-29 DIAGNOSIS — Z955 Presence of coronary angioplasty implant and graft: Secondary | ICD-10-CM | POA: Diagnosis not present

## 2022-05-29 DIAGNOSIS — Z48812 Encounter for surgical aftercare following surgery on the circulatory system: Secondary | ICD-10-CM | POA: Diagnosis not present

## 2022-05-29 NOTE — Progress Notes (Signed)
Daily Session Note  Patient Details  Name: Dawn Bruce MRN: [id number] Date of Birth: May 05, 1946 Referring Provider:   Flowsheet Row Cardiac Rehab from 04/24/2022 in [facility] Cardiac and Pulmonary Rehab  Referring Provider Rolla Plate       Encounter Date: 05/29/2022  Check In:  Session Check In - 05/29/22 1331       Check-In   Supervising physician immediately available to respond to emergencies See telemetry face sheet for immediately available ER MD    Location [facility]-Cardiac & Pulmonary Rehab    Staff Present Renita Papa, RN BSN;Joseph Tessie Fass, RCP,RRT,BSRT;Megan Tamala Julian, RN, [place]    Virtual Visit No    Medication changes reported     No    Fall or balance concerns reported    No    Warm-up and Cool-down Performed on first and last piece of equipment    Resistance Training Performed Yes    VAD Patient? No    PAD/SET Patient? No      Pain Assessment   Currently in Pain? No/denies                Social History   Tobacco Use  Smoking Status Former   Packs/day: 0.50   Years: 5.00   Total pack years: 2.50   Types: Cigarettes   Quit date: 1990   Years since quitting: 34.0   Passive exposure: Past  Smokeless Tobacco Never  Tobacco Comments   quit around 1990-1995 (?)    Goals Met:  Independence with exercise equipment Exercise tolerated well No report of concerns or symptoms today Strength training completed today  Goals Unmet:  Not Applicable  Comments: Pt able to follow exercise prescription today without complaint.  Will continue to monitor for progression.    Dr. Emily Filbert is Medical Director for [place].  Dr. Ottie Glazier is Medical Director for [facility] Pulmonary Rehabilitation.

## 2022-05-31 ENCOUNTER — Encounter: Payer: Medicare Other | Admitting: *Deleted

## 2022-05-31 DIAGNOSIS — Z48812 Encounter for surgical aftercare following surgery on the circulatory system: Secondary | ICD-10-CM | POA: Diagnosis not present

## 2022-05-31 DIAGNOSIS — Z955 Presence of coronary angioplasty implant and graft: Secondary | ICD-10-CM | POA: Diagnosis not present

## 2022-05-31 NOTE — Progress Notes (Unsigned)
I,Audie Wieser S Sorayah Schrodt,acting as a Education administrator for Lavon Paganini, MD.,have documented all relevant documentation on the behalf of Lavon Paganini, MD,as directed by  Lavon Paganini, MD while in the presence of Lavon Paganini, MD.     Established patient visit   Patient: Dawn Bruce   DOB: 02-08-1946   77 y.o. Female  MRN: [id number] Visit Date: 06/01/2022  Today's healthcare provider: Lavon Paganini, MD   Chief Complaint  Patient presents with   Hypertension   Hyperlipidemia   Hyperglycemia   Subjective    HPI  Patient C/O of easy bruising and bleeding. She reports she is on two different blood thinners.  On eliquis chronically and on Plavix x50m  Hypertension, follow-up  BP Readings from Last 3 Encounters:  06/01/22 105/65  01/20/22 (!) 154/69  12/27/21 139/73   Wt Readings from Last 3 Encounters:  06/01/22 131 lb (59.4 kg)  04/24/22 127 lb 14.4 oz (58 kg)  01/20/22 119 lb (54 kg)     She was last seen for hypertension 5 months ago.  BP at that visit was 130/66. Management since that visit includes no changes. She reports excellent compliance with treatment.   Outside blood pressures are stable at physical therapy. She reports that at PT her BP is almost low.   --------------------------------------------------------------------------------------------------- Lipid/Cholesterol, follow-up  Last Lipid Panel: Lab Results  Component Value Date   CHOL 201 (H) 10/25/2021   LDLCALC 108 (H) 10/25/2021   HDL 71 10/25/2021   TRIG 125 10/25/2021    She was last seen for this 5 months ago.  Management since that visit includes no changes.  She reports excellent compliance with treatment. She is not having side effects.   Last metabolic panel Lab Results  Component Value Date   GLUCOSE 107 (H) 01/20/2022   NA 141 01/20/2022   K 4.5 01/20/2022   BUN 33 (H) 01/20/2022   CREATININE 1.47 (H) 01/20/2022   EGFR 29 (L) 12/12/2021   GFRNONAA 37 (L) 01/20/2022    CALCIUM 8.7 (L) 01/20/2022   AST 22 09/01/2021   ALT 13 09/01/2021   The 10-year ASCVD risk score (Arnett DK, et al., 2019) is: 20.6%  --------------------------------------------------------------------------------------------------- Prediabetes, Follow-up  Lab Results  Component Value Date   HGBA1C 5.5 12/12/2021   HGBA1C 6.5 (H) 08/31/2021   HGBA1C 6.0 (A) 12/03/2020   GLUCOSE 107 (H) 01/20/2022   GLUCOSE 137 (H) 12/12/2021   GLUCOSE 105 (H) 09/02/2021    Last seen for for this5 months ago.  Management since that visit includes no changes.  Pertinent Labs:    Component Value Date/Time   CHOL 201 (H) 10/25/2021 0841   TRIG 125 10/25/2021 0841   CHOLHDL 2.8 10/25/2021 0841   CREATININE 1.47 (H) 01/20/2022 1330    Wt Readings from Last 3 Encounters:  06/01/22 131 lb (59.4 kg)  04/24/22 127 lb 14.4 oz (58 kg)  01/20/22 119 lb (54 kg)    -----------------------------------------------------------------------------------------   Medications: Outpatient Medications Prior to Visit  Medication Sig   amLODipine (NORVASC) 5 MG tablet Take 1 tablet (5 mg total) by mouth daily.   apixaban (ELIQUIS) 5 MG TABS tablet Take 1 tablet (5 mg total) by mouth 2 (two) times daily.   atorvastatin (LIPITOR) 40 MG tablet Take 1 tablet (40 mg total) by mouth daily.   carvedilol (COREG) 6.25 MG tablet Take 1 tablet (6.25 mg total) by mouth 2 (two) times daily.   clopidogrel (PLAVIX) 75 MG tablet Take by mouth.   famotidine (  PEPCID) 40 MG tablet Take 1 tablet (40 mg total) by mouth daily.   traZODone (DESYREL) 50 MG tablet Take 0.5-1 tablets (25-50 mg total) by mouth at bedtime as needed for sleep.   valsartan (DIOVAN) 160 MG tablet Take 160 mg by mouth in the morning and at bedtime.   [DISCONTINUED] fentaNYL (DURAGESIC) 50 MCG/HR Place 1 patch onto the skin every 3 (three) days.   [DISCONTINUED] pregabalin (LYRICA) 150 MG capsule Take 1 capsule (150 mg total) by mouth 2 (two) times  daily.   No facility-administered medications prior to visit.    Review of Systems  Constitutional:  Negative for appetite change and unexpected weight change.  Eyes:  Negative for visual disturbance.  Respiratory:  Negative for chest tightness and shortness of breath.   Cardiovascular:  Negative for chest pain, palpitations and leg swelling.  Gastrointestinal:  Negative for abdominal pain, nausea and vomiting.  Neurological:  Negative for dizziness, light-headedness and headaches.  Hematological:  Bruises/bleeds easily.       Objective    BP 105/65 Comment: home reading (at PT)  Pulse 62   Temp 97.6 F (36.4 C) (Temporal)   Resp 16   Wt 131 lb (59.4 kg)   BMI 22.84 kg/m  BP Readings from Last 3 Encounters:  06/01/22 105/65  01/20/22 (!) 154/69  12/27/21 139/73   Wt Readings from Last 3 Encounters:  06/01/22 131 lb (59.4 kg)  04/24/22 127 lb 14.4 oz (58 kg)  01/20/22 119 lb (54 kg)      Physical Exam Vitals reviewed.  Constitutional:      General: She is not in acute distress.    Appearance: Normal appearance. She is well-developed. She is not diaphoretic.  HENT:     Head: Normocephalic and atraumatic.  Eyes:     General: No scleral icterus.    Conjunctiva/sclera: Conjunctivae normal.  Neck:     Thyroid: No thyromegaly.  Cardiovascular:     Rate and Rhythm: Normal rate and regular rhythm.     Pulses: Normal pulses.     Heart sounds: Normal heart sounds. No murmur heard. Pulmonary:     Effort: Pulmonary effort is normal. No respiratory distress.     Breath sounds: Normal breath sounds. No wheezing, rhonchi or rales.  Musculoskeletal:     Cervical back: Neck supple.     Right lower leg: No edema.     Left lower leg: No edema.  Lymphadenopathy:     Cervical: No cervical adenopathy.  Skin:    General: Skin is warm and dry.     Findings: Bruising present. No rash.  Neurological:     Mental Status: She is alert and oriented to person, place, and time.  Mental status is at baseline.  Psychiatric:        Mood and Affect: Mood normal.        Behavior: Behavior normal.     No results found for any visits on 06/01/22.  Assessment & Plan     Problem List Items Addressed This Visit       Cardiovascular and Mediastinum   Essential hypertension - Primary    Well controlled on home/rehab readings Continue current medications Recheck metabolic panel F/u in 6 months       Relevant Orders   Comprehensive metabolic panel   Senile purpura (HCC)    Worsening now that she is on antiplatelet and anticoag She is worried about the bleeding Discussed the difference in Eliquis and plavix Supposed to be  on plavix x70mWill discuss with cardiology as they manage both of these meds      Relevant Orders   CBC     Genitourinary   CKD (chronic kidney disease) stage 3, GFR 30-59 ml/min (HCC)    Chronic and stable Recheck metabolic panel Avoid nephrotoxic meds       Relevant Orders   Comprehensive metabolic panel     Other   Chronic pain syndrome (Chronic)    Chronic and stable  Continue fentanyl patch and lyrica      Relevant Medications   fentaNYL (DURAGESIC) 50 MCG/HR   pregabalin (LYRICA) 150 MG capsule   Neurogenic pain (Chronic)   Relevant Medications   fentaNYL (DURAGESIC) 50 MCG/HR   pregabalin (LYRICA) 150 MG capsule   Hyperlipidemia    Continue statin Repeat FLP and CMP Goal LDL < 70      Relevant Orders   Comprehensive metabolic panel   Lipid panel   Prediabetes    Recommend low carb diet Recheck A1c       Relevant Orders   Hemoglobin A1c   Chronic anticoagulation   Relevant Orders   CBC     Return in about 6 months (around 11/30/2022) for CPE, AWV.      I, ALavon Paganini MD, have reviewed all documentation for this visit. The documentation on 06/01/22 for the exam, diagnosis, procedures, and orders are all accurate and complete.   Bacigalupo, ADionne Bucy MD, MPH [facility]

## 2022-05-31 NOTE — Progress Notes (Signed)
Daily Session Note  Patient Details  Name: Mashawn Brazil MRN: [id number] Date of Birth: [birth year] Referring Provider:   Flowsheet Row Cardiac Rehab from 04/24/2022 in [facility] Cardiac and Pulmonary Rehab  Referring Provider Rolla Plate       Encounter Date: 05/31/2022  Check In:  Session Check In - 05/31/22 1343       Check-In   Supervising physician immediately available to respond to emergencies See telemetry face sheet for immediately available ER MD    Location [facility]-Cardiac & Pulmonary Rehab    Staff Present Renita Papa, RN BSN;Joseph Tessie Fass, RCP,RRT,BSRT;Megan Tamala Julian, RN, [place]    Virtual Visit No    Medication changes reported     No    Fall or balance concerns reported    No    Warm-up and Cool-down Performed on first and last piece of equipment    Resistance Training Performed Yes    VAD Patient? No    PAD/SET Patient? No      Pain Assessment   Currently in Pain? No/denies                Social History   Tobacco Use  Smoking Status Former   Packs/day: 0.50   Years: 5.00   Total pack years: 2.50   Types: Cigarettes   Quit date: 1990   Years since quitting: 34.0   Passive exposure: Past  Smokeless Tobacco Never  Tobacco Comments   quit around 1990-1995 (?)    Goals Met:  Independence with exercise equipment Exercise tolerated well Personal goals reviewed No report of concerns or symptoms today Strength training completed today  Goals Unmet:  Not Applicable  Comments: Pt able to follow exercise prescription today without complaint.  Will continue to monitor for progression.    Dr. Emily Filbert is Medical Director for [place].  Dr. Ottie Glazier is Medical Director for [facility] Pulmonary Rehabilitation.

## 2022-06-01 ENCOUNTER — Encounter: Payer: Self-pay | Admitting: Family Medicine

## 2022-06-01 ENCOUNTER — Ambulatory Visit (INDEPENDENT_AMBULATORY_CARE_PROVIDER_SITE_OTHER): Payer: Medicare Other | Admitting: Family Medicine

## 2022-06-01 VITALS — BP 105/65 | HR 62 | Temp 97.6°F | Resp 16 | Wt 131.0 lb

## 2022-06-01 DIAGNOSIS — Z7901 Long term (current) use of anticoagulants: Secondary | ICD-10-CM

## 2022-06-01 DIAGNOSIS — I10 Essential (primary) hypertension: Secondary | ICD-10-CM | POA: Diagnosis not present

## 2022-06-01 DIAGNOSIS — M792 Neuralgia and neuritis, unspecified: Secondary | ICD-10-CM

## 2022-06-01 DIAGNOSIS — E782 Mixed hyperlipidemia: Secondary | ICD-10-CM

## 2022-06-01 DIAGNOSIS — N1832 Chronic kidney disease, stage 3b: Secondary | ICD-10-CM | POA: Diagnosis not present

## 2022-06-01 DIAGNOSIS — G894 Chronic pain syndrome: Secondary | ICD-10-CM

## 2022-06-01 DIAGNOSIS — R7303 Prediabetes: Secondary | ICD-10-CM

## 2022-06-01 DIAGNOSIS — D692 Other nonthrombocytopenic purpura: Secondary | ICD-10-CM | POA: Diagnosis not present

## 2022-06-01 MED ORDER — FENTANYL 50 MCG/HR TD PT72: 1.0000 | MEDICATED_PATCH | TRANSDERMAL | 0 refills | Status: DC

## 2022-06-01 MED ORDER — PREGABALIN 150 MG PO CAPS: 150.0000 mg | ORAL_CAPSULE | Freq: Two times a day (BID) | ORAL | 1 refills | Status: DC

## 2022-06-01 NOTE — Assessment & Plan Note (Signed)
Chronic and stable Recheck metabolic panel Avoid nephrotoxic meds  

## 2022-06-01 NOTE — Assessment & Plan Note (Signed)
Recommend low carb diet °Recheck A1c  °

## 2022-06-01 NOTE — Assessment & Plan Note (Signed)
Well controlled on home/rehab readings Continue current medications Recheck metabolic panel F/u in 6 months

## 2022-06-01 NOTE — Assessment & Plan Note (Signed)
Continue statin Repeat FLP and CMP Goal LDL < 70

## 2022-06-01 NOTE — Assessment & Plan Note (Signed)
Chronic and stable  Continue fentanyl patch and lyrica

## 2022-06-01 NOTE — Assessment & Plan Note (Signed)
Worsening now that she is on antiplatelet and anticoag She is worried about the bleeding Discussed the difference in Eliquis and plavix Supposed to be on plavix x32mWill discuss with cardiology as they manage both of these meds

## 2022-06-02 LAB — CBC
Hematocrit: 40.5 % (ref 34.0–46.6)
Hemoglobin: 13.6 g/dL (ref 11.1–15.9)
MCH: 30.2 pg (ref 26.6–33.0)
MCHC: 33.6 g/dL (ref 31.5–35.7)
MCV: 90 fL (ref 79–97)
Platelets: 132 10*3/uL — ABNORMAL LOW (ref 150–450)
RBC: 4.51 x10E6/uL (ref 3.77–5.28)
RDW: 12.8 % (ref 11.7–15.4)
WBC: 5 10*3/uL (ref 3.4–10.8)

## 2022-06-02 LAB — LIPID PANEL
Chol/HDL Ratio: 2.1 ratio (ref 0.0–4.4)
Cholesterol, Total: 156 mg/dL (ref 100–199)
HDL: 73 mg/dL (ref >39)
LDL Chol Calc (NIH): 73 mg/dL (ref 0–99)
Triglycerides: 46 mg/dL (ref 0–149)
VLDL Cholesterol Cal: 10 mg/dL (ref 5–40)

## 2022-06-02 LAB — HEMOGLOBIN A1C
Est. average glucose Bld gHb Est-mCnc: 111 mg/dL
Hgb A1c MFr Bld: 5.5 % (ref 4.8–5.6)

## 2022-06-02 LAB — COMPREHENSIVE METABOLIC PANEL
ALT: 14 IU/L (ref 0–32)
AST: 31 IU/L (ref 0–40)
Albumin/Globulin Ratio: 1.5 (ref 1.2–2.2)
Albumin: 3.8 g/dL (ref 3.8–4.8)
Alkaline Phosphatase: 84 IU/L (ref 44–121)
BUN/Creatinine Ratio: 21 (ref 12–28)
BUN: 23 mg/dL (ref 8–27)
Bilirubin Total: 0.5 mg/dL (ref 0.0–1.2)
CO2: 26 mmol/L (ref 20–29)
Calcium: 9.1 mg/dL (ref 8.7–10.3)
Chloride: 108 mmol/L — ABNORMAL HIGH (ref 96–106)
Creatinine, Ser: 1.11 mg/dL — ABNORMAL HIGH (ref 0.57–1.00)
Globulin, Total: 2.5 g/dL (ref 1.5–4.5)
Glucose: 87 mg/dL (ref 70–99)
Potassium: 4.1 mmol/L (ref 3.5–5.2)
Sodium: 147 mmol/L — ABNORMAL HIGH (ref 134–144)
Total Protein: 6.3 g/dL (ref 6.0–8.5)
eGFR: 51 mL/min/{1.73_m2} — ABNORMAL LOW (ref >59)

## 2022-06-02 LAB — SPECIMEN STATUS REPORT

## 2022-06-05 ENCOUNTER — Encounter: Payer: Medicare Other | Admitting: *Deleted

## 2022-06-05 VITALS — Ht 63.5 in | Wt 131.2 lb

## 2022-06-05 DIAGNOSIS — Z48812 Encounter for surgical aftercare following surgery on the circulatory system: Secondary | ICD-10-CM | POA: Diagnosis not present

## 2022-06-05 DIAGNOSIS — Z955 Presence of coronary angioplasty implant and graft: Secondary | ICD-10-CM

## 2022-06-05 NOTE — Progress Notes (Signed)
Discharge Summary  Kikue Gerhart  DOB: 1946-01-14  Stanton Kidney graduated today from  rehab with 14 sessions completed.  Details of the patient's exercise prescription and what She needs to do in order to continue the prescription and progress were discussed with patient.  Patient was given a copy of prescription and goals.  Patient verbalized understanding.  Areesha plans to continue to exercise by walking.   [place] Name 04/24/22 1644 06/05/22 1356       6 Minute Walk   Phase Initial Discharge    Distance 1290 feet 1535 feet    Distance % Change -- 19 %    Distance Feet Change -- 245 ft    Walk Time 6 minutes 6 minutes    # of Rest Breaks 0 0    MPH 2.44 2.91    METS 2.55 3.36    RPE 8 12    Perceived Dyspnea  0 1    VO2 Peak 8.93 11.77    Symptoms No Yes (comment)    Comments -- Slightly SOB    Resting HR 63 bpm 75 bpm    Resting BP 112/72 126/68    Resting Oxygen Saturation  99 % 96 %    Exercise Oxygen Saturation  during 6 min walk 98 % 90 %    Max Ex. HR 83 bpm 106 bpm    Max Ex. BP 142/80 164/78    2 Minute Post BP 122/80 --

## 2022-06-05 NOTE — Progress Notes (Signed)
Cardiac Individual Treatment Plan  Patient Details  Name: Dawn Bruce MRN: [id number] Date of Birth: May 24, 1945 Referring Provider:   Flowsheet Row Cardiac Rehab from 04/24/2022 in [facility] Cardiac and Pulmonary Rehab  Referring Provider Rolla Plate       Initial Encounter Date:  Flowsheet Row Cardiac Rehab from 04/24/2022 in [facility] Cardiac and Pulmonary Rehab  Date 04/24/22       Visit Diagnosis: Status post coronary artery stent placement  Patient's Home Medications on Admission:  Current Outpatient Medications:    amLODipine (NORVASC) 5 MG tablet, Take 1 tablet (5 mg total) by mouth daily., Disp: 90 tablet, Rfl: 1   apixaban (ELIQUIS) 5 MG TABS tablet, Take 1 tablet (5 mg total) by mouth 2 (two) times daily., Disp: 60 tablet, Rfl: 5   atorvastatin (LIPITOR) 40 MG tablet, Take 1 tablet (40 mg total) by mouth daily., Disp: 90 tablet, Rfl: 1   carvedilol (COREG) 6.25 MG tablet, Take 1 tablet (6.25 mg total) by mouth 2 (two) times daily., Disp: 180 tablet, Rfl: 3   clopidogrel (PLAVIX) 75 MG tablet, Take by mouth., Disp: , Rfl:    famotidine (PEPCID) 40 MG tablet, Take 1 tablet (40 mg total) by mouth daily., Disp: 90 tablet, Rfl: 0   fentaNYL (DURAGESIC) 50 MCG/HR, Place 1 patch onto the skin every 3 (three) days., Disp: 10 patch, Rfl: 0   pregabalin (LYRICA) 150 MG capsule, Take 1 capsule (150 mg total) by mouth 2 (two) times daily., Disp: 180 capsule, Rfl: 1   traZODone (DESYREL) 50 MG tablet, Take 0.5-1 tablets (25-50 mg total) by mouth at bedtime as needed for sleep., Disp: 30 tablet, Rfl: 3   valsartan (DIOVAN) 160 MG tablet, Take 160 mg by mouth in the morning and at bedtime., Disp: , Rfl:   Past Medical History: Past Medical History:  Diagnosis Date   Actinic keratosis    Chronic pain    History of basal cell carcinoma (BCC) 05/02/2021   right upper back paraspinal   History of SCC (squamous cell carcinoma) of skin 09/10/2019   left dorsum wrist ED&C done 10/28/19   History of SCC  (squamous cell carcinoma) of skin 03/04/2020   right dorsum hand   History of squamous cell carcinoma in situ (SCCIS) 03/04/2020   left dorsum wrist  ED&C   History of squamous cell carcinoma in situ (SCCIS) 03/04/2020   right medial infraorbital  ED&C 04/28/2020   History of squamous cell carcinoma in situ (SCCIS) 10/28/2019   right dorsum hand proximal lateral   History of squamous cell carcinoma in situ (SCCIS) 10/28/2019   right dorsum hand proximal medial   Hyperlipidemia    Hypertension    Renal disorder    Squamous cell carcinoma in situ 10/31/2021   left distal  tricep, EDC    Tobacco Use: Social History   Tobacco Use  Smoking Status Former   Packs/day: 0.50   Years: 5.00   Total pack years: 2.50   Types: Cigarettes   Quit date: 1990   Years since quitting: 34.0   Passive exposure: Past  Smokeless Tobacco Never  Tobacco Comments   quit around 1990-1995 (?)    Labs: Review Flowsheet  More data exists      Latest Ref Rng & Units 12/03/2020 08/31/2021 10/25/2021 12/12/2021 06/01/2022  Labs for ITP Cardiac and Pulmonary Rehab  Cholestrol 100 - 199 mg/dL - - 201  - 156   LDL (calc) 0 - 99 mg/dL - - 108  - 73  HDL-C >39 mg/dL - - 71  - 73   Trlycerides 0 - 149 mg/dL - - 125  - 46   Hemoglobin A1c 4.8 - 5.6 % 6.0  6.5  - 5.5  5.5      Exercise Target Goals: Exercise Program Goal: Individual exercise prescription set using results from initial 6 min walk test and THRR while considering  patient's activity barriers and safety.   Exercise Prescription Goal: Initial exercise prescription builds to 30-45 minutes a day of aerobic activity, 2-3 days per week.  Home exercise guidelines will be given to patient during program as part of exercise prescription that the participant will acknowledge.   Education: Aerobic Exercise: - Group verbal and visual presentation on the components of exercise prescription. Introduces F.I.T.T principle from ACSM for exercise prescriptions.   Reviews F.I.T.T. principles of aerobic exercise including progression. Written material given at graduation.   Education: Resistance Exercise: - Group verbal and visual presentation on the components of exercise prescription. Introduces F.I.T.T principle from ACSM for exercise prescriptions  Reviews F.I.T.T. principles of resistance exercise including progression. Written material given at graduation.    Education: Exercise & Equipment Safety: - Individual verbal instruction and demonstration of equipment use and safety with use of the equipment. Flowsheet Row Cardiac Rehab from 05/31/2022 in [facility] Cardiac and Pulmonary Rehab  Date 04/24/22  Educator [facility]  Instruction Review Code 1- Verbalizes Understanding       Education: Exercise Physiology & General Exercise Guidelines: - Group verbal and written instruction with models to review the exercise physiology of the cardiovascular system and associated critical values. Provides general exercise guidelines with specific guidelines to those with heart or lung disease.  Flowsheet Row Cardiac Rehab from 05/31/2022 in [facility] Cardiac and Pulmonary Rehab  Education need identified 04/24/22  Date 05/31/22  Educator KW  Instruction Review Code 1- [employer] Understanding       Education: Flexibility, Balance, Mind/Body Relaxation: - Group verbal and visual presentation with interactive activity on the components of exercise prescription. Introduces F.I.T.T principle from ACSM for exercise prescriptions. Reviews F.I.T.T. principles of flexibility and balance exercise training including progression. Also discusses the mind body connection.  Reviews various relaxation techniques to help reduce and manage stress (i.e. Deep breathing, progressive muscle relaxation, and visualization). Balance handout provided to take home. Written material given at graduation.   Activity Barriers & Risk Stratification:  Activity Barriers & Cardiac Risk Stratification -  04/24/22 1648       Activity Barriers & Cardiac Risk Stratification   Activity Barriers Other (comment)    Comments nerve pain from horse back riding accident in left hand             6 Minute Walk:  6 Minute Walk     Row Name 04/24/22 1644 06/05/22 1356       6 Minute Walk   Phase Initial Discharge    Distance 1290 feet 1535 feet    Distance % Change -- 19 %    Distance Feet Change -- 245 ft    Walk Time 6 minutes 6 minutes    # of Rest Breaks 0 0    MPH 2.44 2.91    METS 2.55 3.36    RPE 8 12    Perceived Dyspnea  0 1    VO2 Peak 8.93 11.77    Symptoms No Yes (comment)    Comments -- Slightly SOB    Resting HR 63 bpm 75 bpm    Resting BP 112/72  126/68    Resting Oxygen Saturation  99 % 96 %    Exercise Oxygen Saturation  during 6 min walk 98 % 90 %    Max Ex. HR 83 bpm 106 bpm    Max Ex. BP 142/80 164/78    2 Minute Post BP 122/80 --             Oxygen Initial Assessment:   Oxygen Re-Evaluation:   Oxygen Discharge (Final Oxygen Re-Evaluation):   Initial Exercise Prescription:  Initial Exercise Prescription - 04/24/22 1600       Date of Initial Exercise RX and Referring Provider   Date 04/24/22    Referring Provider Rolla Plate      Oxygen   Maintain Oxygen Saturation 88% or higher      Treadmill   MPH 2    Grade 1    Minutes 15    METs 2.81      Recumbant Bike   Level 2    RPM 50    Minutes 15    METs 2.55      REL-XR   Level 2    Speed 50    Minutes 15    METs 2.55      T5 Nustep   Level 2    SPM 80    Minutes 15    METs 2.55      Track   Laps 35    Minutes 15    METs 2.9      Prescription Details   Frequency (times per week) 3    Duration Progress to 30 minutes of continuous aerobic without signs/symptoms of physical distress      Intensity   THRR 40-80% of Max Heartrate 94-126    Ratings of Perceived Exertion 11-13    Perceived Dyspnea 0-4      Progression   Progression Continue to progress workloads to maintain  intensity without signs/symptoms of physical distress.      Resistance Training   Training Prescription Yes    Weight 3    Reps 10-15             Perform Capillary Blood Glucose checks as needed.  Exercise Prescription Changes:   Exercise Prescription Changes     Row Name 04/24/22 1600 05/10/22 1100 05/10/22 1400 05/25/22 1000       Response to Exercise   Blood Pressure (Admit) 112/72 118/60 -- 112/66    Blood Pressure (Exercise) 142/80 128/64 -- 152/56    Blood Pressure (Exit) 122/80 106/64 -- 102/60    Heart Rate (Admit) 63 bpm 73 bpm -- 86 bpm    Heart Rate (Exercise) 83 bpm 105 bpm -- 118 bpm    Heart Rate (Exit) 63 bpm 80 bpm -- 79 bpm    Oxygen Saturation (Admit) 99 % -- -- --    Oxygen Saturation (Exercise) 98 % -- -- --    Oxygen Saturation (Exit) 99 % -- -- --    Rating of Perceived Exertion (Exercise) 8 13 -- 14    Perceived Dyspnea (Exercise) 0 -- -- --    Symptoms none none -- none    Comments 6 MWT results 3rd full session of exercise -- --    Duration -- Progress to 30 minutes of  aerobic without signs/symptoms of physical distress -- Continue with 30 min of aerobic exercise without signs/symptoms of physical distress.    Intensity -- THRR unchanged -- THRR unchanged      Progression   Progression --  Continue to progress workloads to maintain intensity without signs/symptoms of physical distress. -- Continue to progress workloads to maintain intensity without signs/symptoms of physical distress.    Average METs -- 2.7 -- 3.17      Resistance Training   Training Prescription -- Yes -- Yes    Weight -- 3 lb -- 3 lb    Reps -- 10-15 -- 10-15      Interval Training   Interval Training -- No -- No      Treadmill   MPH -- 2.2 -- 2.2    Grade -- 1.5 -- 1.5    Minutes -- 15 -- 15    METs -- 3.14 -- 3.14      Recumbant Bike   Level -- 5 -- 4    Watts -- 27 -- --    Minutes -- 15 -- 15    METs -- 3.4 -- 3.22      REL-XR   Level -- -- -- 3     Minutes -- -- -- 15    METs -- -- -- 2.9      Track   Laps -- -- -- 30    Minutes -- -- -- 15    METs -- -- -- 2.63      Home Exercise Plan   Plans to continue exercise at -- -- Home (comment)  walking, clubhouse gym Home (comment)  walking, clubhouse gym    Frequency -- -- Add 3 additional days to program exercise sessions. Add 3 additional days to program exercise sessions.    Initial Home Exercises Provided -- -- 05/10/22 05/10/22      Oxygen   Maintain Oxygen Saturation -- 88% or higher -- 88% or higher             Exercise Comments:   Exercise Comments     Row Name 05/01/22 1349           Exercise Comments First full day of exercise!  Patient was oriented to gym and equipment including functions, settings, policies, and procedures.  Patient's individual exercise prescription and treatment plan were reviewed.  All starting workloads were established based on the results of the 6 minute walk test done at initial orientation visit.  The plan for exercise progression was also introduced and progression will be customized based on patient's performance and goals.                Exercise Goals and Review:   Exercise Goals     Row Name 04/24/22 1655             Exercise Goals   Increase Physical Activity Yes       Intervention Provide advice, education, support and counseling about physical activity/exercise needs.;Develop an individualized exercise prescription for aerobic and resistive training based on initial evaluation findings, risk stratification, comorbidities and participant's personal goals.       Expected Outcomes Short Term: Attend rehab on a regular basis to increase amount of physical activity.;Long Term: Exercising regularly at least 3-5 days a week.;Long Term: Add in home exercise to make exercise part of routine and to increase amount of physical activity.       Increase Strength and Stamina Yes       Intervention Provide advice, education, support  and counseling about physical activity/exercise needs.;Develop an individualized exercise prescription for aerobic and resistive training based on initial evaluation findings, risk stratification, comorbidities and participant's personal goals.       Expected  Outcomes Short Term: Increase workloads from initial exercise prescription for resistance, speed, and METs.;Short Term: Perform resistance training exercises routinely during rehab and add in resistance training at home;Long Term: Improve cardiorespiratory fitness, muscular endurance and strength as measured by increased METs and functional capacity (6MWT)       Able to understand and use rate of perceived exertion (RPE) scale Yes       Intervention Provide education and explanation on how to use RPE scale       Expected Outcomes Short Term: Able to use RPE daily in rehab to express subjective intensity level;Long Term:  Able to use RPE to guide intensity level when exercising independently       Able to understand and use Dyspnea scale Yes       Intervention Provide education and explanation on how to use Dyspnea scale       Expected Outcomes Short Term: Able to use Dyspnea scale daily in rehab to express subjective sense of shortness of breath during exertion;Long Term: Able to use Dyspnea scale to guide intensity level when exercising independently       Knowledge and understanding of Target Heart Rate Range (THRR) Yes       Intervention Provide education and explanation of THRR including how the numbers were predicted and where they are located for reference       Expected Outcomes Short Term: Able to state/look up THRR;Long Term: Able to use THRR to govern intensity when exercising independently;Short Term: Able to use daily as guideline for intensity in rehab       Able to check pulse independently Yes       Intervention Provide education and demonstration on how to check pulse in carotid and radial arteries.;Review the importance of being  able to check your own pulse for safety during independent exercise       Expected Outcomes Short Term: Able to explain why pulse checking is important during independent exercise       Understanding of Exercise Prescription Yes       Intervention Provide education, explanation, and written materials on patient's individual exercise prescription       Expected Outcomes Short Term: Able to explain program exercise prescription;Long Term: Able to explain home exercise prescription to exercise independently                Exercise Goals Re-Evaluation :  Exercise Goals Re-Evaluation     Row Name 05/01/22 1349 05/10/22 1146 05/10/22 1432 05/25/22 1045 05/31/22 1359     Exercise Goal Re-Evaluation   Exercise Goals Review Increase Physical Activity;Able to understand and use rate of perceived exertion (RPE) scale;Knowledge and understanding of Target Heart Rate Range (THRR);Understanding of Exercise Prescription;Increase Strength and Stamina;Able to check pulse independently Increase Physical Activity;Understanding of Exercise Prescription;Increase Strength and Stamina Increase Physical Activity;Understanding of Exercise Prescription;Increase Strength and Stamina;Able to understand and use rate of perceived exertion (RPE) scale;Knowledge and understanding of Target Heart Rate Range (THRR);Able to understand and use Dyspnea scale;Able to check pulse independently Increase Physical Activity;Increase Strength and Stamina;Understanding of Exercise Prescription Increase Physical Activity;Increase Strength and Stamina;Understanding of Exercise Prescription   Comments Reviewed RPE scale, THR and program prescription with pt today.  Pt voiced understanding and was given a copy of goals to take home. Janei is off to a good start with rehab. She has been able to increase beyond her initial exercise prescription and is already up to level 5 on the recumbent bike and working over 3  METs on the treadmill. We will  continue to monitor as she progresses in the program. Reviewed home exercise with pt today.  Pt plans to walk and use gym at clubhouse for exercise.  Reviewed THR, pulse, RPE, sign and symptoms, pulse oximetery and when to call 911 or MD.  Also discussed weather considerations and indoor options.  Pt voiced understanding. Theresia is doing well in rehab. She has stayed consistent with her workload on the treadmill at 2.2 mph and an incline of 1.5%. She has also begun to hit her THR and improved to level 3 on the XR. She increased her overall average MET level to 3.17 METs as well. We will continue to monitor her progress in the program. Psalm feels like she has more endurance and does not have the "chest tiredness" she used to have. She stated she feels stronger after coming consistently to cardiac rehab classes. She does go to [place] for severl months in the winter and is will graduate early, on January 15th before she leaves for [place] for the season. Upon graduation she plans to walk for exercise. She states that she walks a lot when she is in [place].   Expected Outcomes Short: Use RPE daily to regulate intensity.  Long: Follow program prescription in THR. Short: Continue current exercise prescription and maintain good attendance Long: Build up overall strength and stamina Short: Start going to clubhouse for exercise on off days Long:Conitnue to improve stamina Short: Continue to increase workload on treadmill. Long: Continue to improve strength and stamina. Short: improve on 6 MWT and graduate early from cardiac rehab. Long: maintain independent exercise program upon graduation.            Discharge Exercise Prescription (Final Exercise Prescription Changes):  Exercise Prescription Changes - 05/25/22 1000       Response to Exercise   Blood Pressure (Admit) 112/66    Blood Pressure (Exercise) 152/56    Blood Pressure (Exit) 102/60    Heart Rate (Admit) 86 bpm    Heart Rate (Exercise) 118 bpm     Heart Rate (Exit) 79 bpm    Rating of Perceived Exertion (Exercise) 14    Symptoms none    Duration Continue with 30 min of aerobic exercise without signs/symptoms of physical distress.    Intensity THRR unchanged      Progression   Progression Continue to progress workloads to maintain intensity without signs/symptoms of physical distress.    Average METs 3.17      Resistance Training   Training Prescription Yes    Weight 3 lb    Reps 10-15      Interval Training   Interval Training No      Treadmill   MPH 2.2    Grade 1.5    Minutes 15    METs 3.14      Recumbant Bike   Level 4    Minutes 15    METs 3.22      REL-XR   Level 3    Minutes 15    METs 2.9      Track   Laps 30    Minutes 15    METs 2.63      Home Exercise Plan   Plans to continue exercise at Home (comment)   walking, clubhouse gym   Frequency Add 3 additional days to program exercise sessions.    Initial Home Exercises Provided 05/10/22      Oxygen   Maintain Oxygen Saturation 88% or higher  Nutrition:  Target Goals: Understanding of nutrition guidelines, daily intake of sodium '1500mg'$ , cholesterol '200mg'$ , calories 30% from fat and 7% or less from saturated fats, daily to have 5 or more servings of fruits and vegetables.  Education: All About Nutrition: -Group instruction provided by verbal, written material, interactive activities, discussions, models, and posters to present general guidelines for heart healthy nutrition including fat, fiber, MyPlate, the role of sodium in heart healthy nutrition, utilization of the nutrition label, and utilization of this knowledge for meal planning. Follow up email sent as well. Written material given at graduation. Flowsheet Row Cardiac Rehab from 05/31/2022 in [facility] Cardiac and Pulmonary Rehab  Education need identified 04/24/22       Biometrics:  Pre Biometrics - 04/24/22 1656       Pre Biometrics   Height 5' 3.5" (1.613 m)    Weight  127 lb 14.4 oz (58 kg)    BMI (Calculated) 22.3    Single Leg Stand 7.07 seconds             Post Biometrics - 06/05/22 1359        Post  Biometrics   Height 5' 3.5" (1.613 m)    Weight 131 lb 3.2 oz (59.5 kg)    BMI (Calculated) 22.87    Single Leg Stand 4.26 seconds             Nutrition Therapy Plan and Nutrition Goals:  Nutrition Therapy & Goals - 04/24/22 1427       Nutrition Therapy   Diet Heart healthy, low Na    Drug/Food Interactions Statins/Certain Fruits    Protein (specify units) 65-75g    Fiber 25 grams    Whole Grain Foods 3 servings    Saturated Fats 12 max. grams    Fruits and Vegetables 8 servings/day    Sodium 2 grams      Personal Nutrition Goals   Nutrition Goal ST: practice MyPlate guidelines, include 1 additional whole grain into routine, read food labels LT: limit sodium <2g/day, follow MyPlate guidelines    Comments 77 y.o. F admitted to cardiac rehab s/p stent placement. 21 standard drinks per week. PMHx chronic systolic HF, a.fib, HTN, HLD, previous tobacco use, skin cancer, CKD stg 3, GERD. Relevant medications lipitor, fentanyl, pepcid, lyrica.  PYP Score: Vegetables & Fruits 8/12. Breads, Grains & Cereals 4/12. Red & Processed Meat 8/12. Poultry 2/2. Fish & Shellfish 0/4. Beans, Nuts & Seeds 1/4. Milk & Dairy Foods 4/6. Toppings, Oils, Seasonings & Salt 10/20. Sweets, Snacks & Restaurant Food 10/14. Beverages 6/10. B: coffee (cream or half and half and 2 stevia packets) with grapes L: soups (canned) or grilled sandwich (tuna or ham and cheese on white bread) or leftovers. Sometimes will go to [employer] and get vegetables, hamburgers (1x/week) and hot dogs (every coupl of weeks) D: meat (chicken, pork or beef), starch, and non-starchy vegetables. Tocarra reports they will eat more refined grains and smaller amounts of wheat products. She reports salting food while cooking. She cooks with butter. Drinks: water, 1/2 diet pepsi can at lunch. Alcohol: 2  drinks before dinner. Winston reports eating lots of fresh fruits and vegetables over the summer from the farmers market. Discussed heart healthy eating; encouraged include whole grains, following MyPlate structure, and managing sodium by reading labels.      Intervention Plan   Intervention Prescribe, educate and counsel regarding individualized specific dietary modifications aiming towards targeted core components such as weight, hypertension, lipid management, diabetes, heart failure  and other comorbidities.;Nutrition handout(s) given to patient.    Expected Outcomes Short Term Goal: Understand basic principles of dietary content, such as calories, fat, sodium, cholesterol and nutrients.;Short Term Goal: A plan has been developed with personal nutrition goals set during dietitian appointment.;Long Term Goal: Adherence to prescribed nutrition plan.             Nutrition Assessments:  MEDIFICTS Score Key: ?70 Need to make dietary changes  40-70 Heart Healthy Diet ? 40 Therapeutic Level Cholesterol Diet  Flowsheet Row Cardiac Rehab from 06/05/2022 in [facility] Cardiac and Pulmonary Rehab  Picture Your Plate Total Score on Discharge 52      Picture Your Plate Scores: <62 Unhealthy dietary pattern with much room for improvement. 41-50 Dietary pattern unlikely to meet recommendations for good health and room for improvement. 51-60 More healthful dietary pattern, with some room for improvement.  >60 Healthy dietary pattern, although there may be some specific behaviors that could be improved.    Nutrition Goals Re-Evaluation:  Nutrition Goals Re-Evaluation     [place] Name 05/31/22 1403             Goals   Nutrition Goal ST: practice MyPlate guidelines, include 1 additional whole grain into routine, read food labels LT: limit sodium <2g/day, follow MyPlate guidelines       Comment Patient reports that she is watching her sodium intake, eating fruits and vegetables, and encorporating protein  in her diet.       Expected Outcome Short: graduate early from [place] since she is moving to [place] for the winter. Long: maintain heart healthy diet.                Nutrition Goals Discharge (Final Nutrition Goals Re-Evaluation):  Nutrition Goals Re-Evaluation - 05/31/22 1403       Goals   Nutrition Goal ST: practice MyPlate guidelines, include 1 additional whole grain into routine, read food labels LT: limit sodium <2g/day, follow MyPlate guidelines    Comment Patient reports that she is watching her sodium intake, eating fruits and vegetables, and encorporating protein in her diet.    Expected Outcome Short: graduate early from [place] since she is moving to [place] for the winter. Long: maintain heart healthy diet.             Psychosocial: Target Goals: Acknowledge presence or absence of significant depression and/or stress, maximize coping skills, provide positive support system. Participant is able to verbalize types and ability to use techniques and skills needed for reducing stress and depression.   Education: Stress, Anxiety, and Depression - Group verbal and visual presentation to define topics covered.  Reviews how body is impacted by stress, anxiety, and depression.  Also discusses healthy ways to reduce stress and to treat/manage anxiety and depression.  Written material given at graduation. Flowsheet Row Cardiac Rehab from 05/31/2022 in [facility]  Education need identified 04/24/22       Education: Sleep Hygiene -Provides group verbal and written instruction about how sleep can affect your health.  Define sleep hygiene, discuss sleep cycles and impact of sleep habits. Review good sleep hygiene tips.    Initial Review & Psychosocial Screening:  Initial Psych Review & Screening - 04/10/22 1112       Initial Review   Current issues with None Identified      Family Dynamics   Good Support System? Yes   husband, daughter , granddaughter      Barriers   Psychosocial barriers to  participate in program There are no identifiable barriers or psychosocial needs.      Screening Interventions   Interventions To provide support and resources with identified psychosocial needs;Provide feedback about the scores to participant    Expected Outcomes Short Term goal: Utilizing psychosocial counselor, staff and physician to assist with identification of specific Stressors or current issues interfering with healing process. Setting desired goal for each stressor or current issue identified.;Long Term Goal: Stressors or current issues are controlled or eliminated.;Short Term goal: Identification and review with participant of any Quality of Life or Depression concerns found by scoring the questionnaire.;Long Term goal: The participant improves quality of Life and PHQ9 Scores as seen by post scores and/or verbalization of changes             Quality of Life Scores:   Quality of Life - 06/05/22 1346       Quality of Life   Select Quality of Life      Quality of Life Scores   Health/Function Pre 23.2 %    Health/Function Post 27.23 %    Health/Function % Change 17.37 %    Socioeconomic Pre 24.06 %    Socioeconomic Post 28.93 %    Socioeconomic % Change  20.24 %    Psych/Spiritual Pre 27.43 %    Psych/Spiritual Post 27.07 %    Psych/Spiritual % Change -1.31 %    Family Pre 25.2 %    Family Post 28.8 %    Family % Change 14.29 %    GLOBAL Pre 24.53 %    GLOBAL Post 27.78 %    GLOBAL % Change 13.25 %            Scores of 19 and below usually indicate a poorer quality of life in these areas.  A difference of  2-3 points is a clinically meaningful difference.  A difference of 2-3 points in the total score of the Quality of Life Index has been associated with significant improvement in overall quality of life, self-image, physical symptoms, and general health in studies assessing change in quality of life.  PHQ-9: Review Flowsheet   More data exists      06/05/2022 06/01/2022 04/24/2022 12/12/2021 09/27/2021  Depression screen PHQ 2/9  Decreased Interest 0 0 0 0 0  Down, Depressed, Hopeless 0 0 0 0 0  PHQ - 2 Score 0 0 0 0 0  Altered sleeping 0 0 1 0 0  Tired, decreased energy 0 0 0 0 0  Change in appetite 0 0 0 0 0  Feeling bad or failure about yourself  0 0 0 0 0  Trouble concentrating 0 0 0 0 0  Moving slowly or fidgety/restless 0 0 0 0 0  Suicidal thoughts 0 0 0 0 0  PHQ-9 Score 0 0 1 0 0  Difficult doing work/chores - Not difficult at all Not difficult at all Not difficult at all Not difficult at all   Interpretation of Total Score  Total Score Depression Severity:  1-4 = Minimal depression, 5-9 = Mild depression, 10-14 = Moderate depression, 15-19 = Moderately severe depression, 20-27 = Severe depression   Psychosocial Evaluation and Intervention:  Psychosocial Evaluation - 04/10/22 1133       Psychosocial Evaluation & Interventions   Interventions Encouraged to exercise with the program and follow exercise prescription    Comments Yexalen has no barriers to attending the program. She lives with her husband and they have a [place] dog we ever had".  Her husband and her daughter are her support. She hopes to improve her stamina and energy and get back into an exercise routine. She is ready to start the program    Expected Outcomes STG Tanishka attends all scheduled sessions, she is able to progress with her exercise prescription. LTG Cheyan continues  her  exercise progression after discharge.    Continue Psychosocial Services  Follow up required by staff             Psychosocial Re-Evaluation:  Psychosocial Re-Evaluation     [place] Name 05/31/22 1402             Psychosocial Re-Evaluation   Current issues with None Identified       Comments No new stress or sleep concerns reported by patient. She plans to to to [place] for the winter and is going to graduate early from the program.       Expected  Outcomes Short: graduate from [place]: continue to maintain good mental health habits.       Continue Psychosocial Services  No Follow up required                Psychosocial Discharge (Final Psychosocial Re-Evaluation):  Psychosocial Re-Evaluation - 05/31/22 1402       Psychosocial Re-Evaluation   Current issues with None Identified    Comments No new stress or sleep concerns reported by patient. She plans to to to [place] for the winter and is going to graduate early from the program.    Expected Outcomes Short: graduate from [place]: continue to maintain good mental health habits.    Continue Psychosocial Services  No Follow up required             Vocational Rehabilitation: Provide vocational rehab assistance to qualifying candidates.   Vocational Rehab Evaluation & Intervention:  Vocational Rehab - 04/10/22 1118       Initial Vocational Rehab Evaluation & Intervention   Assessment shows need for Vocational Rehabilitation No      Vocational Rehab Re-Evaulation   Comments retired             Education: Education Goals: Education classes will be provided on a variety of topics geared toward better understanding of heart health and risk factor modification. Participant will state understanding/return demonstration of topics presented as noted by education test scores.  Learning Barriers/Preferences:  Learning Barriers/Preferences - 04/10/22 1118       Learning Barriers/Preferences   Learning Barriers None    Learning Preferences None             General Cardiac Education Topics:  AED/CPR: - Group verbal and written instruction with the use of models to demonstrate the basic use of the AED with the basic ABC's of resuscitation.   Anatomy and Cardiac Procedures: - Group verbal and visual presentation and models provide information about basic cardiac anatomy and function. Reviews the testing methods done to diagnose heart disease and the outcomes of  the test results. Describes the treatment choices: Medical Management, Angioplasty, or Coronary Bypass Surgery for treating various heart conditions including Myocardial Infarction, Angina, Valve Disease, and Cardiac Arrhythmias.  Written material given at graduation.   Medication Safety: - Group verbal and visual instruction to review commonly prescribed medications for heart and lung disease. Reviews the medication, class of the drug, and side effects. Includes the steps to properly store meds and maintain the prescription regimen.  Written material given at graduation. Flowsheet Row Cardiac Rehab from 05/31/2022 in  [place] [facility]  Date 05/03/22  Educator SB  Instruction Review Code 1- Verbalizes Understanding       Intimacy: - Group verbal instruction through game format to discuss how heart and lung disease can affect sexual intimacy. Written material given at graduation..   Know Your Numbers and Heart Failure: - Group verbal and visual instruction to discuss disease risk factors for cardiac and pulmonary disease and treatment options.  Reviews associated critical values for Overweight/Obesity, Hypertension, Cholesterol, and Diabetes.  Discusses basics of heart failure: signs/symptoms and treatments.  Introduces Heart Failure Zone chart for action plan for heart failure.  Written material given at graduation. Flowsheet Row Cardiac Rehab from 05/31/2022 in [facility] Re[facility]  Date 05/10/22  Educator SB  Instruction Review Code 1- Verbalizes Understanding       Infection Prevention: - Provides verbal and written material to individual with discussion of infection control including proper hand washing and proper equipment cleaning during exercise session. Flowsheet Row Cardiac Rehab from 05/31/2022 in [facility] Cardiac and Pulmonary Rehab  Date 04/24/22  Educator [facility]  Instruction Review Code 1- Verbalizes Understanding       Falls Prevention: - Provides  verbal and written material to individual with discussion of falls prevention and safety. Flowsheet Row Cardiac Rehab from 05/31/2022 in [facility] Cardiac and Pulmonary Rehab  Date 04/24/22  Educator [facility]  Instruction Review Code 1- Verbalizes Understanding       Other: -Provides group and verbal instruction on various topics (see comments)   Knowledge Questionnaire Score:  Knowledge Questionnaire Score - 06/05/22 1345       Knowledge Questionnaire Score   Post Score 25/26             Core Components/Risk Factors/Patient Goals at Admission:  Personal Goals and Risk Factors at Admission - 04/24/22 1657       Core Components/Risk Factors/Patient Goals on Admission    Weight Management Yes    Intervention Weight Management: Provide education and appropriate resources to help participant work on and attain dietary goals.;Weight Management: Develop a combined nutrition and exercise program designed to reach desired caloric intake, while maintaining appropriate intake of nutrient and fiber, sodium and fats, and appropriate energy expenditure required for the weight goal.    Admit Weight 127 lb 14.4 oz (58 kg)    Goal Weight: Short Term 127 lb (57.6 kg)    Goal Weight: Long Term 127 lb (57.6 kg)    Expected Outcomes Short Term: Continue to assess and modify interventions until short term weight is achieved;Long Term: Adherence to nutrition and physical activity/exercise program aimed toward attainment of established weight goal;Weight Maintenance: Understanding of the daily nutrition guidelines, which includes 25-35% calories from fat, 7% or less cal from saturated fats, less than '200mg'$  cholesterol, less than 1.5gm of sodium, & 5 or more servings of fruits and vegetables daily;Understanding recommendations for meals to include 15-35% energy as protein, 25-35% energy from fat, 35-60% energy from carbohydrates, less than '200mg'$  of dietary cholesterol, 20-35 gm of total fiber daily;Understanding of  distribution of calorie intake throughout the day with the consumption of 4-5 meals/snacks    Hypertension Yes    Intervention Provide education on lifestyle modifcations including regular physical activity/exercise, weight management, moderate sodium restriction and increased consumption of fresh fruit, vegetables, and low fat dairy, alcohol moderation, and smoking cessation.;Monitor prescription use compliance.    Expected Outcomes Short Term: Continued assessment and intervention until BP is < 140/61m HG in hypertensive participants. <  130/66m HG in hypertensive participants with diabetes, heart failure or chronic kidney disease.;Long Term: Maintenance of blood pressure at goal levels.    Lipids Yes    Intervention Provide education and support for participant on nutrition & aerobic/resistive exercise along with prescribed medications to achieve LDL '70mg'$ , HDL >'40mg'$ .    Expected Outcomes Short Term: Participant states understanding of desired cholesterol values and is compliant with medications prescribed. Participant is following exercise prescription and nutrition guidelines.;Long Term: Cholesterol controlled with medications as prescribed, with individualized exercise RX and with personalized nutrition plan. Value goals: LDL < '70mg'$ , HDL > 40 mg.             Education:Diabetes - Individual verbal and written instruction to review signs/symptoms of diabetes, desired ranges of glucose level fasting, after meals and with exercise. Acknowledge that pre and post exercise glucose checks will be done for 3 sessions at entry of program.   Core Components/Risk Factors/Patient Goals Review:   Goals and Risk Factor Review     Row Name 05/31/22 1405             Core Components/Risk Factors/Patient Goals Review   Personal Goals Review Weight Management/Obesity;Lipids;Hypertension       Review Patient reports taking all medications a prescribed. Her weight is also reported as steady. [place]. She plans to continue to exercise, follow heart healthy dietary plan, and take all medications upon graudation to help control cardiac risk factors.       Expected Outcomes Short: improve 6 MTW distance and graduate early from cardiac rehab. Long: control cardiac risk factors.                Core Components/Risk Factors/Patient Goals at Discharge (Final Review):   Goals and Risk Factor Review - 05/31/22 1405       Core Components/Risk Factors/Patient Goals Review   Personal Goals Review Weight Management/Obesity;Lipids;Hypertension    Review Patient reports taking all medications a prescribed. Her weight is also reported as steady. [place] early. She plans to continue to exercise, follow heart healthy dietary plan, and take all medications upon graudation to help control cardiac risk factors.    Expected Outcomes Short: improve 6 MTW distance and graduate early from cardiac rehab. Long: control cardiac risk factors.             ITP Comments:  ITP Comments     Row Name 04/10/22 1137 04/24/22 1501 05/01/22 1348 05/17/22 1134 06/05/22 1344   ITP Comments Virtual orientation call completed today. shehas an appointment on Date: 04/24/2022  for EP eval and gym Orientation.  [facility] . Completed 6MWT and gym orientation. Initial ITP created and sent for review to Dr. MEmily Filbert Medical Director. First full day of exercise!  Patient was oriented to gym and equipment including functions, settings, policies, and procedures.  Patient's individual exercise prescription and treatment plan were reviewed.  All starting workloads were established based on the results of the 6 minute walk test done at initial orientation visit.  The plan for exercise progression was also introduced and progression will be customized based  on patient's performance and goals. 30 Day review completed. Medical Director ITP review done, changes made as directed, and signed approval by Medical Director.    new to program [place]uated today from  rehab with  14 sessions completed.  Details of the patient's exercise prescription and what She needs to do in order to continue the prescription and progress were discussed with patient.  Patient was given a copy of prescription and goals.  Patient verbalized understanding.  Benisha plans to continue to exercise by walking.            Comments: Discharge ITP

## 2022-06-05 NOTE — Progress Notes (Signed)
Daily Session Note  Patient Details  Name: Dawn Bruce MRN: [id number] Date of Birth: 05/24/1945 Referring Provider:   Flowsheet Row Cardiac Rehab from 04/24/2022 in [facility] Card[facility]  Referring Provider Rolla Plate       Encounter Date: 06/05/2022  Check In:  Session Check In - 06/05/22 1343       Check-In   Supervising physician immediately available to respond to emergencies See telemetry face sheet for immediately available ER MD    Location [facility]-Cardiac & Pulmonary Rehab    Staff Present Renita Papa, RN BSN;Megan Tamala Julian, RN, Terie Purser, RCP,RRT,BSRT    Virtual Visit No    Medication changes reported     No    Fall or balance concerns reported    No    Warm-up and Cool-down Performed on first and last piece of equipment    Resistance Training Performed Yes    VAD Patient? No    PAD/SET Patient? No      Pain Assessment   Currently in Pain? No/denies                Social History   Tobacco Use  Smoking Status Former   Packs/day: 0.50   Years: 5.00   Total pack years: 2.50   Types: Cigarettes   Quit date: 1990   Years since quitting: 34.0   Passive exposure: Past  Smokeless Tobacco Never  Tobacco Comments   quit around 1990-1995 (?)    Goals Met:  Independence with exercise equipment Exercise tolerated well No report of concerns or symptoms today Strength training completed today  Goals Unmet:  Not Applicable  Comments: Dawn Bruce graduated today from  rehab with 14 sessions completed.  Details of the patient's exercise prescription and what Dawn Bruce needs to do in order to continue the prescription and progress were discussed with patient.  Patient was given a copy of prescription and goals.  Patient verbalized understanding.  Dawn Bruce plans to continue to exercise by walking.      Dr. Emily Filbert is Medical Director for [name].  Dr. Ottie Glazier is Medical Director for [name] Pulmonary Rehabilitation.

## 2022-06-05 NOTE — Patient Instructions (Signed)
Discharge Patient Instructions  Patient Details  Name: Dawn Bruce MRN: [id number] Date of Birth: 1945/10/22 Referring Provider:  Virginia Crews, MD   Number of Visits: 14  Reason for Discharge:  Early Exit:  Personal  Diagnosis:  Status post coronary artery stent placement  Initial Exercise Prescription:  Initial Exercise Prescription - 04/24/22 1600       Date of Initial Exercise RX and Referring Provider   Date 04/24/22    Referring Provider Dawn Bruce      Oxygen   Maintain Oxygen Saturation 88% or higher      Treadmill   MPH 2    Grade 1    Minutes 15    METs 2.81      Recumbant Bike   Level 2    RPM 50    Minutes 15    METs 2.55      REL-XR   Level 2    Speed 50    Minutes 15    METs 2.55      T5 Nustep   Level 2    SPM 80    Minutes 15    METs 2.55      Track   Laps 35    Minutes 15    METs 2.9      Prescription Details   Frequency (times per week) 3    Duration Progress to 30 minutes of continuous aerobic without signs/symptoms of physical distress      Intensity   THRR 40-80% of Max Heartrate 94-126    Ratings of Perceived Exertion 11-13    Perceived Dyspnea 0-4      Progression   Progression Continue to progress workloads to maintain intensity without signs/symptoms of physical distress.      Resistance Training   Training Prescription Yes    Weight 3    Reps 10-15             Discharge Exercise Prescription (Final Exercise Prescription Changes):  Exercise Prescription Changes - 05/25/22 1000       Response to Exercise   Blood Pressure (Admit) 112/66    Blood Pressure (Exercise) 152/56    Blood Pressure (Exit) 102/60    Heart Rate (Admit) 86 bpm    Heart Rate (Exercise) 118 bpm    Heart Rate (Exit) 79 bpm    Rating of Perceived Exertion (Exercise) 14    Symptoms none    Duration Continue with 30 min of aerobic exercise without signs/symptoms of physical distress.    Intensity THRR unchanged      Progression    Progression Continue to progress workloads to maintain intensity without signs/symptoms of physical distress.    Average METs 3.17      Resistance Training   Training Prescription Yes    Weight 3 lb    Reps 10-15      Interval Training   Interval Training No      Treadmill   MPH 2.2    Grade 1.5    Minutes 15    METs 3.14      Recumbant Bike   Level 4    Minutes 15    METs 3.22      REL-XR   Level 3    Minutes 15    METs 2.9      Track   Laps 30    Minutes 15    METs 2.63      Home Exercise Plan   Plans to continue exercise at Home (comment)  walking, clubhouse gym   Frequency Add 3 additional days to program exercise sessions.    Initial Home Exercises Provided 05/10/22      Oxygen   Maintain Oxygen Saturation 88% or higher             Functional Capacity:  6 Minute Walk     Row Name 04/24/22 1644 06/05/22 1356       6 Minute Walk   Phase Initial Discharge    Distance 1290 feet 1535 feet    Distance % Change -- 19 %    Distance Feet Change -- 245 ft    Walk Time 6 minutes 6 minutes    # of Rest Breaks 0 0    MPH 2.44 2.91    METS 2.55 3.36    RPE 8 12    Perceived Dyspnea  0 1    VO2 Peak 8.93 11.77    Symptoms No Yes (comment)    Comments -- Slightly SOB    Resting HR 63 bpm 75 bpm    Resting BP 112/72 126/68    Resting Oxygen Saturation  99 % 96 %    Exercise Oxygen Saturation  during 6 min walk 98 % 90 %    Max Ex. HR 83 bpm 106 bpm    Max Ex. BP 142/80 164/78    2 Minute Post BP 122/80 --             Nutrition & Weight - Outcomes:  Pre Biometrics - 04/24/22 1656       Pre Biometrics   Height 5' 3.5" (1.613 m)    Weight 127 lb 14.4 oz (58 kg)    BMI (Calculated) 22.3    Single Leg Stand 7.07 seconds             Post Biometrics - 06/05/22 1359        Post  Biometrics   Height 5' 3.5" (1.613 m)    Weight 131 lb 3.2 oz (59.5 kg)    BMI (Calculated) 22.87    Single Leg Stand 4.26 seconds              Nutrition:  Nutrition Therapy & Goals - 04/24/22 1427       Nutrition Therapy   Diet Heart healthy, low Na    Drug/Food Interactions Statins/Certain Fruits    Protein (specify units) 65-75g    Fiber 25 grams    Whole Grain Foods 3 servings    Saturated Fats 12 max. grams    Fruits and Vegetables 8 servings/day    Sodium 2 grams      Personal Nutrition Goals   Nutrition Goal ST: practice MyPlate guidelines, include 1 additional whole grain into routine, read food labels LT: limit sodium <2g/day, follow MyPlate guidelines    Comments 77 y.o. F admitted to cardiac rehab s/p stent placement. 21 standard drinks per week. PMHx chronic systolic HF, a.fib, HTN, HLD, previous tobacco use, skin cancer, CKD stg 3, GERD. Relevant medications lipitor, fentanyl, pepcid, lyrica.  PYP Score: Vegetables & Fruits 8/12. Breads, Grains & Cereals 4/12. Red & Processed Meat 8/12. Poultry 2/2. Fish & Shellfish 0/4. Beans, Nuts & Seeds 1/4. Milk & Dairy Foods 4/6. Toppings, Oils, Seasonings & Salt 10/20. Sweets, Snacks & Restaurant Food 10/14. Beverages 6/10. B: coffee (cream or half and half and 2 stevia packets) with grapes L: soups (canned) or grilled sandwich (tuna or ham and cheese on white bread) or leftovers. Sometimes will go to [employer]  gr[employer] and get vegetables, hamburgers (1x/week) and hot dogs (every coupl of weeks) D: meat (chicken, pork or beef), starch, and non-starchy vegetables. Dawn Bruce reports they will eat more refined grains and smaller amounts of wheat products. She reports salting food while cooking. She cooks with butter. Drinks: water, 1/2 diet pepsi can at lunch. Alcohol: 2 drinks before dinner. Dawn Bruce reports eating lots of fresh fruits and vegetables over the summer from the farmers market. Discussed heart healthy eating; encouraged include whole grains, following MyPlate structure, and managing sodium by reading labels.      Intervention Plan   Intervention Prescribe, educate and counsel  regarding individualized specific dietary modifications aiming towards targeted core components such as weight, hypertension, lipid management, diabetes, heart failure and other comorbidities.;Nutrition handout(s) given to patient.    Expected Outcomes Short Term Goal: Understand basic principles of dietary content, such as calories, fat, sodium, cholesterol and nutrients.;Short Term Goal: A plan has been developed with personal nutrition goals set during dietitian appointment.;Long Term Goal: Adherence to prescribed nutrition plan.

## 2022-06-12 ENCOUNTER — Other Ambulatory Visit: Payer: Self-pay

## 2022-06-12 DIAGNOSIS — I10 Essential (primary) hypertension: Secondary | ICD-10-CM

## 2022-06-12 DIAGNOSIS — E782 Mixed hyperlipidemia: Secondary | ICD-10-CM

## 2022-06-12 DIAGNOSIS — R7303 Prediabetes: Secondary | ICD-10-CM

## 2022-06-12 DIAGNOSIS — Z7901 Long term (current) use of anticoagulants: Secondary | ICD-10-CM

## 2022-06-12 DIAGNOSIS — N1832 Chronic kidney disease, stage 3b: Secondary | ICD-10-CM

## 2022-06-16 ENCOUNTER — Telehealth: Payer: Self-pay

## 2022-06-16 NOTE — Progress Notes (Signed)
Care Management & Coordination Services Pharmacy Team  Reason for Encounter: Appointment Reminder  Contacted patient to confirm telephone appointment with Daron Offer PharmD, on 06/20/2022 at 1:00 pm.  Spoke with patient on 06/16/2022   Do you have any problems getting your medications? Yes  If yes what types of problems are you experiencing? Financial barriers   Patient reports she has issue affording two of her medications.Patient states one of them is Eliquis but she is unsure of the other one at this time.  What is your top health concern you would like to discuss at your upcoming visit?   Financial issue with current medications.  Have you seen any other providers since your last visit with PCP? No   Chart review:  Recent office visits:  06/01/2022 Dr. Brita Romp MD (PCP)  No medication Changes noted, Return in about 6 months  04/27/2022 Dr. Brita Romp MD (PCP) Start Trazodone 50 MG tablet PRN 02/15/2022 Rory Percy DO (PCP Office) start nirmatrelvir/ritonavir EUA, renal dosing, (PAXLOVID) 10 x 150 MG & 10 x '100MG'$  TABS  Recent consult visits:  03/16/2022 Dr. Rolla Plate MD (Cardiology) Atorvastatin increased to '80mg'$  two weeks ago. Start ezetimibe Ambulatory Referral to Cardiac Rehab  03/08/2022 Dr. Jerelene Redden MD (Cardiology) No Medication changes noted 03/01/2022 Dr. Jerelene Redden MD (Cardiology) Start taking a baby aspirin 81 mg daily , Increase Atorvastatin to 80 mg daily.  01/19/2022 Dr. Alfonzo Feller MD (Nephrology) No Medication Changes noted 01/16/2022  Trellis Moment MD (Pulmonology) Unable to see note 01/02/2022 Ezzard Flax NP (Cardiology)Reduced amlodipine to 2.'5mg'$  once a day  12/27/2021 Dr. Christian Mate MD (General Surgery) No medication Changes noted  Hospital visits:  Medication Reconciliation was completed by comparing discharge summary, patient's EMR and Pharmacy list, and upon discussion with patient.   Admitted to the hospital on 01/20/2022 due to AKI (acute kidney injury)   Discharge date was 01/20/2022. Discharged from [facility]?Medications Started at [facility] Discharge:?? -started None ID  Medication Changes at Hospital Discharge: -Changed None ID  Medications Discontinued at Hospital Discharge: -Stopped None ID  Medications that remain the same after Hospital Discharge:??  -All other medications will remain the same.     Star Rating Drugs:  Medication: Atorvastatin 40 mg  Last Fill: 06/06/2022 [place]. Medication: Valsartan 160 mg  Last Fill: 03/16/2022 [place].   Care Gaps: Annual wellness visit in last year? Yes last completed 10/10/2021. Dtap Vaccine Shingrix Vaccine  Covid-19 Vaccine  [place] Clinical Pharmacist Assistant [phone]

## 2022-06-20 ENCOUNTER — Encounter: Payer: Self-pay | Admitting: Family Medicine

## 2022-06-20 ENCOUNTER — Ambulatory Visit: Payer: BLUE CROSS/BLUE SHIELD

## 2022-06-20 DIAGNOSIS — E782 Mixed hyperlipidemia: Secondary | ICD-10-CM

## 2022-06-20 DIAGNOSIS — N1832 Chronic kidney disease, stage 3b: Secondary | ICD-10-CM

## 2022-06-20 DIAGNOSIS — I5032 Chronic diastolic (congestive) heart failure: Secondary | ICD-10-CM

## 2022-06-20 DIAGNOSIS — I6529 Occlusion and stenosis of unspecified carotid artery: Secondary | ICD-10-CM

## 2022-06-20 DIAGNOSIS — M792 Neuralgia and neuritis, unspecified: Secondary | ICD-10-CM

## 2022-06-20 MED ORDER — EZETIMIBE 10 MG PO TABS: 10.0000 mg | ORAL_TABLET | Freq: Every day | ORAL | 1 refills | Status: DC

## 2022-06-20 NOTE — Progress Notes (Signed)
Care Management & Coordination Services Pharmacy Note  06/20/2022 Name:  Dawn Bruce MRN:  [id number] DOB:  1946/01/30  Summary: Patient presents for initial consult.   -Patient still feels tired with exertion. Given improvement in renal function, could consider re-initiation of SGLT2 inhibitor. Defer for now.   -Frequent bruising due to Eliquis + clopidogrel. She states she was instructed by Cardiology to decrease Eliquis to 2.5 mg twice daily to minimize bleeding risk  -ACC/AHA guidelines would recommend more stringent LDL goal given patient's very high cardiac risk. Patient's LDL improved from 108 to 73 after Atorvastatin was increased to 80 mg.   Recommendations/Changes made from today's visit: -START Ezetimibe 10 mg daily  -Counseled to monitor blood pressure weekly.   Follow up plan: CPP follow-up 3 months   Recommended Problem List Changes:  Add: Persistent atrial fibrillation    Subjective: Dawn Bruce is an 77 y.o. year old female who is a primary patient of Bacigalupo, Dionne Bucy, MD.  The care coordination team was consulted for assistance with disease management and care coordination needs.    Engaged with patient by telephone for initial visit.  Recent office visits: 06/01/2022 Dr. Brita Romp MD (PCP)  No medication Changes noted, Return in about 6 months  04/27/2022 Dr. Brita Romp MD (PCP) Start Trazodone 50 MG tablet PRN 02/15/2022 Rory Percy DO (PCP Office) start nirmatrelvir/ritonavir EUA, renal dosing, (PAXLOVID) 10 x 150 MG & 10 x '100MG'$  TABS  Recent consult visits: 03/16/2022 Dr. Rolla Plate MD (Cardiology) Atorvastatin increased to '80mg'$  two weeks ago. Start ezetimibe Ambulatory Referral to Cardiac Rehab  03/08/2022 Dr. Jerelene Redden MD (Cardiology) No Medication changes noted 03/01/2022 Dr. Jerelene Redden MD (Cardiology) Start taking a baby aspirin 81 mg daily , Increase Atorvastatin to 80 mg daily.  01/19/2022 Dr. Alfonzo Feller MD (Nephrology) No Medication Changes  noted 01/16/2022  Trellis Moment MD (Pulmonology) Unable to see note 01/02/2022 Ezzard Flax NP (Cardiology)Reduced amlodipine to 2.'5mg'$  once a day  12/27/2021 Dr. Christian Mate MD (General Surgery) No medication Changes noted  Hospital visits: Admitted to the hospital on 03/08/22 for PCI   Admitted to the hospital on 01/20/2022 due to AKI (acute kidney injury)  Discharge date was 01/20/2022. Discharged from [facility].   Objective:  Lab Results  Component Value Date   CREATININE 1.11 (H) 06/01/2022   BUN 23 06/01/2022   EGFR 51 (L) 06/01/2022   GFRNONAA 37 (L) 01/20/2022   GFRAA 54 (L) 10/08/2019   NA 147 (H) 06/01/2022   K 4.1 06/01/2022   CALCIUM 9.1 06/01/2022   CO2 26 06/01/2022   GLUCOSE 87 06/01/2022    Lab Results  Component Value Date/Time   HGBA1C 5.5 06/01/2022 12:00 AM   HGBA1C 5.5 12/12/2021 02:02 PM    Last diabetic Eye exam: No results found for: "HMDIABEYEEXA"  Last diabetic Foot exam: No results found for: "HMDIABFOOTEX"   Lab Results  Component Value Date   CHOL 156 06/01/2022   HDL 73 06/01/2022   LDLCALC 73 06/01/2022   TRIG 46 06/01/2022   CHOLHDL 2.1 06/01/2022       Latest Ref Rng & Units 06/01/2022   12:00 AM 09/01/2021    4:55 AM 08/31/2021    7:42 AM  Hepatic Function  Total Protein 6.0 - 8.5 g/dL 6.3  5.6  6.4   Albumin 3.8 - 4.8 g/dL 3.8  2.2  2.5   AST 0 - 40 IU/L '31  22  23   '$ ALT 0 - 32 IU/L 14  13  14   Alk Phosphatase 44 - 121 IU/L 84  66  76   Total Bilirubin 0.0 - 1.2 mg/dL 0.5  0.9  0.7     Lab Results  Component Value Date/Time   TSH 2.68 01/03/2018 12:00 AM   TSH 2.68 01/03/2018 12:00 AM       Latest Ref Rng & Units 06/01/2022   12:00 AM 01/20/2022    1:30 PM 09/02/2021    4:19 AM  CBC  WBC 3.4 - 10.8 x10E3/uL 5.0  4.7  11.2   Hemoglobin 11.1 - 15.9 g/dL 13.6  15.2  11.9   Hematocrit 34.0 - 46.6 % 40.5  47.5  37.3   Platelets 150 - 450 x10E3/uL 132  105  209     Lab Results  Component  Value Date/Time   VD25OH 40.9 01/03/2018 12:00 AM   VD25OH 40.9 01/03/2018 12:00 AM   VITAMINB12 444 06/06/2018 09:33 AM    Clinical ASCVD: Yes  The 10-year ASCVD risk score (Arnett DK, et al., 2019) is: 20.8%   Values used to calculate the score:     Age: 77 years     Sex: Female     Is Non-Hispanic African American: No     Diabetic: No     Tobacco smoker: No     Systolic Blood Pressure: 409 mmHg     Is BP treated: Yes     HDL Cholesterol: 73 mg/dL     Total Cholesterol: 156 mg/dL       06/05/2022    1:46 PM 06/01/2022    1:29 PM 04/24/2022    4:58 PM  Depression screen PHQ 2/9  Decreased Interest 0 0 0  Down, Depressed, Hopeless 0 0 0  PHQ - 2 Score 0 0 0  Altered sleeping 0 0 1  Tired, decreased energy 0 0 0  Change in appetite 0 0 0  Feeling bad or failure about yourself  0 0 0  Trouble concentrating 0 0 0  Moving slowly or fidgety/restless 0 0 0  Suicidal thoughts 0 0 0  PHQ-9 Score 0 0 1  Difficult doing work/chores  Not difficult at all Not difficult at all     Social History   Tobacco Use  Smoking Status Former   Packs/day: 0.50   Years: 5.00   Total pack years: 2.50   Types: Cigarettes   Quit date: 1990   Years since quitting: 34.1   Passive exposure: Past  Smokeless Tobacco Never  Tobacco Comments   quit around 1990-1995 (?)   BP Readings from Last 3 Encounters:  06/01/22 105/65  01/20/22 (!) 154/69  12/27/21 139/73   Pulse Readings from Last 3 Encounters:  06/01/22 62  01/20/22 65  12/27/21 60   Wt Readings from Last 3 Encounters:  06/05/22 131 lb 3.2 oz (59.5 kg)  06/01/22 131 lb (59.4 kg)  04/24/22 127 lb 14.4 oz (58 kg)   BMI Readings from Last 3 Encounters:  06/05/22 22.88 kg/m  06/01/22 22.84 kg/m  04/24/22 22.30 kg/m    No Known Allergies  Medications Reviewed Today     Reviewed by [place] Crews, MD (Physician) on 06/01/22 at 78  Med List Status: <None>   Medication Order Taking? Sig Documenting Provider Last  Dose Status Informant  amLODipine (NORVASC) 5 MG tablet [id number] Yes Take 1 tablet (5 mg total) by mouth daily. [place] Crews, MD Taking Active   apixaban [facility]) 5 MG TABS tablet [id number] Yes Take 1 tablet (  5 mg total) by mouth 2 (two) times daily. Kate Sable, MD Taking Active Self, Pharmacy Records  atorvastatin (LIPITOR) 40 MG tablet [id number] Yes Take 1 tablet (40 mg total) by mouth daily. Gwyneth Sprout, FNP Taking Active   carvedilol (COREG) 6.25 MG tablet [id number] Yes Take 1 tablet (6.25 mg total) by mouth 2 (two) times daily. [place] Crews, MD Taking Active   clopidogrel (PLAVIX) 75 MG tablet [id number] Yes Take by mouth. [provider] Taking Active   famotidine (PEPCID) 40 MG tablet [id number] Yes Take 1 tablet (40 mg total) by mouth daily. [place] Crews, MD Taking Active Self, Pharmacy Records           Med Note Domenica Reamer Apr 10, 2022 11:06 AM) Taking as needed  fentaNYL (DURAGESIC) 50 MCG/HR [id number]  Place 1 patch onto the skin every 3 (three) days. [place] Crews, MD  Active   pregabalin (LYRICA) 150 MG capsule [id number]  Take 1 capsule (150 mg total) by mouth 2 (two) times daily. [place] Crews, MD  Active   traZODone (DESYREL) 50 MG tablet [id number] Yes Take 0.5-1 tablets (25-50 mg total) by mouth at bedtime as needed for sleep. [place] Crews, MD Taking Active   valsartan (DIOVAN) 160 MG tablet [id number] Yes Take 160 mg by mouth in the morning and at bedtime. [provider] Taking Active Self, Pharmacy Records            SDOH:  (Social Determinants of Health) assessments and interventions performed: Yes SDOH Interventions    Flowsheet Row Cardiac Rehab from 04/24/2022 in [facility] Cardiac [facility] Pulmonary Rehab Clinical Support from 04/20/2020 in [place]  SDOH Interventions    Depression Interventions/Treatment  PHQ2-9 Score <4 Follow-up Not Indicated --  Physical  Activity Interventions -- --  [35 minutes]       Medication Assistance: None required.  Patient affirms current coverage meets needs.  Medication Access: Within the past 30 days, how often has patient missed a dose of medication? None Is a pillbox or other method used to improve adherence? Yes  Factors that may affect medication adherence? no barriers identified Are meds synced by current pharmacy? No  Are meds delivered by current pharmacy? No  Does patient experience delays in picking up medications due to transportation concerns? No   [facility] Services Reviewed: Is patient disadvantaged to use [facility] Pharm[facility]?: Yes  Current Rx insurance plan: [place] Name and location of Current pharmacy:  [facility] [place], [place] - [address] [place] [address] Phone: [phone] Fax: [place], [place] - [address] [place] [address] Phone: [phone] Fax: [phone]  [id number] - [place] [place] [address] Phone: [phone] Fax: [place] #[id number] [place], [place] - [place] [address] [place] [address] Phone: [phone] Fax: [phone]  [facility] Pharm[facility] services reviewed with patient today?: No  Patient requests to transfer care to [facility] Pharmacy?: No  Reason patient declined to change pharmacies: Disadvantaged due to insurance/mail order and Travels frequently  Compliance/Adherence/Medication fill history: Care Gaps: Medication:    Atorvastatin 80 mg  Last Fill: 06/06/2022 [place]. Medication:    Valsartan 160 mg        Last Fill: 03/16/2022 [place].   Star-Rating Drugs: Dtap  Vaccine Shingrix Vaccine  Covid-19 Vaccine  Assessment/Plan   Heart Failure (Goal: manage symptoms and prevent exacerbations) -Controlled -Last ejection fraction: 50-55% (Date: May 2022); previously 35-40%  -HF type: HFimpEF (EF improved from <40% to > 40%) -NYHA Class: II (slight limitation of activity) -AHA HF Stage: C (Heart disease and symptoms present) -Current treatment: Amlodipine 2.5 mg daily  Carvedilol 6.25 mg twice daily  Valsartan 160 mg twice daily  -Medications previously tried: Jardiance (AKI)   -Current home BP/HR readings: Has a monitor, rarely uses.  -Some dizziness with postural changes.  -Current home daily weights: Monitors sporadically.  -Current dietary habits: [place] to drinks 24 oz of water daily. 1/2 diet pepsi with lunch -Given improvement in renal function, could consider re-initiation of SGLT2 inhibitor. Defer  -Counseled to monitor blood pressure weekly.  -Recommended to continue current medication  Atrial Fibrillation (Goal: prevent stroke and major bleeding) -Controlled -CHADSVASC: 6 -Current treatment: Rate control: Carvedilol 6.25 mg twice daily  Anticoagulation: Eliquis 2.5 mg twice daily  -Medications previously tried: NA -Frequent bruising due to Eliquis + clopidogrel. She states she was instructed by Cardiology to decrease Eliquis to 2.5 mg twice daily to minimize bleeding risk -Recommended to continue current medication  Hyperlipidemia: (LDL goal < 55) -Uncontrolled -PCI of LAD Oct 2023 -Current treatment: Atorvastatin 80 mg daily  -Current treatment: Clopidogrel 75 mg daily  6 month therapy duration  -Medications previously tried: NA -ACC/AHA guidelines would recommend more stringent LDL goal given patient's very high cardiac risk. Patient's LDL improved from 108 to 73 after Atorvastatin was increased to 80 mg.  -START Ezetimibe 10 mg daily   Junius Argyle, PharmD, [place], CPP  Clinical Pharmacist Practitioner  [facility] [phone]

## 2022-06-22 MED ORDER — TRAZODONE HCL 50 MG PO TABS: 25.0000 mg | ORAL_TABLET | Freq: Every evening | ORAL | 3 refills | Status: DC | PRN

## 2022-06-22 MED ORDER — FENTANYL 50 MCG/HR TD PT72: 1.0000 | MEDICATED_PATCH | TRANSDERMAL | 0 refills | Status: DC

## 2022-06-26 ENCOUNTER — Encounter: Payer: Self-pay | Admitting: Family Medicine

## 2022-06-26 DIAGNOSIS — M792 Neuralgia and neuritis, unspecified: Secondary | ICD-10-CM

## 2022-06-27 MED ORDER — FENTANYL 25 MCG/HR TD PT72: 2.0000 | MEDICATED_PATCH | TRANSDERMAL | 0 refills | Status: DC

## 2022-07-06 ENCOUNTER — Encounter: Payer: Self-pay | Admitting: Family Medicine

## 2022-07-06 ENCOUNTER — Other Ambulatory Visit: Payer: Self-pay

## 2022-07-06 DIAGNOSIS — M792 Neuralgia and neuritis, unspecified: Secondary | ICD-10-CM

## 2022-07-06 MED ORDER — PREGABALIN 150 MG PO CAPS: 150.0000 mg | ORAL_CAPSULE | Freq: Two times a day (BID) | ORAL | 1 refills | Status: DC

## 2022-07-20 DIAGNOSIS — H900 Conductive hearing loss, bilateral: Secondary | ICD-10-CM | POA: Diagnosis not present

## 2022-08-02 ENCOUNTER — Telehealth: Payer: Self-pay

## 2022-08-02 NOTE — Progress Notes (Signed)
Care Management & Coordination Services Pharmacy Team  Reason for Encounter: Hypertension  Contacted patient to discuss hypertension disease state.  Spoke with patient on 08/02/2022     Current antihypertensive regimen:  Amlodipine 2.5 mg daily  Carvedilol 6.25 mg twice daily  Valsartan 160 mg twice daily   Patient verbally confirms she is taking the above medications as directed. Yes  How often are you checking your Blood Pressure?  Patient states she has not check her blood pressure in awhile as she is not at home.Patient reports she is in [place] at the moment.    Current home BP readings:  None ID, Patient states when she checks her blood pressure it usually is "normal".   Wrist or arm cuff: Patient reports she has a arm cuff.  Caffeine intake: Patient reports she tries to limit her caffeine intake.  Salt intake: Patient reports she does not add any salt to her foods.  Any readings above 180/100? No  What recent interventions/DTPs have been made by any provider to improve Blood Pressure control since last CPP Visit: None ID  Any recent hospitalizations or ED visits since last visit with CPP? No  What diet changes have been made to improve Blood Pressure Control?  None ID  What exercise is being done to improve your Blood Pressure Control?  None ID  Adherence Review: Is the patient currently on ACE/ARB medication? Yes Does the patient have >5 day gap between last estimated fill dates? No   Patient states she is still having frequent bruising due to Eliquis and Clopidogrel even after decreasing her Eliquis to 2.5 mg twice daily. Patient states if she gets a cut she will bleed for days. Patient is asking if there something she can do to help. Notified Clinical pharmacist.   Per Clinical pharmacist, I would advise caution to try and minimize bruises and cuts. If she does cut herself, she can utilize a bandage or medical tape to apply pressure to the injury until it  stops bleeding. She should be evaluated at urgent care or the ED if she sees any blood when she goes to the bathroom or notices any significant bleeding that does not stop on its own.    When she has next follow-up with cardiology she should discuss her long-term treatment plan such as switching to aspirin to cut down on her overall bleeding risk.    Patient verbalized understanding.  Star Rating Drugs:  Medication:    Atorvastatin 40 mg      Last Fill: 06/06/2022 [place]. Medication:    Valsartan 160 mg        Last Fill: 06/21/2022  [place].    Care Gaps: Annual wellness visit in last year? Yes last completed 10/10/2021. Dtap Vaccine Shingrix Vaccine  Covid-19 Vaccine    Chart Updates: Recent office visits:  None ID  Recent consult visits:  None ID  Hospital visits:  None in previous 6 months  Medications: Outpatient Encounter Medications as of 08/02/2022  Medication Sig Note   amLODipine (NORVASC) 2.5 MG tablet Take 2.5 mg by mouth daily.    apixaban (ELIQUIS) 2.5 MG TABS tablet Take 2.5 mg by mouth 2 (two) times daily.    atorvastatin (LIPITOR) 80 MG tablet Take 80 mg by mouth daily.    carvedilol (COREG) 6.25 MG tablet Take 1 tablet (6.25 mg total) by mouth 2 (two) times daily.    clopidogrel (PLAVIX) 75 MG tablet Take 75 mg by  mouth daily.    ezetimibe (ZETIA) 10 MG tablet Take 1 tablet (10 mg total) by mouth daily.    famotidine (PEPCID) 40 MG tablet Take 1 tablet (40 mg total) by mouth daily. 04/10/2022: Taking as needed   fentaNYL (DURAGESIC) 25 MCG/HR Place 2 patches onto the skin every 3 (three) days.    pregabalin (LYRICA) 150 MG capsule Take 1 capsule (150 mg total) by mouth 2 (two) times daily.    traZODone (DESYREL) 50 MG tablet Take 0.5-1 tablets (25-50 mg total) by mouth at bedtime as needed for sleep.    valsartan (DIOVAN) 160 MG tablet Take 160 mg by mouth in the morning and at bedtime.    No  facility-administered encounter medications on file as of 08/02/2022.    Recent Office Vitals: BP Readings from Last 3 Encounters:  06/01/22 105/65  01/20/22 (!) 154/69  12/27/21 139/73   Pulse Readings from Last 3 Encounters:  06/01/22 62  01/20/22 65  12/27/21 60    Wt Readings from Last 3 Encounters:  06/05/22 131 lb 3.2 oz (59.5 kg)  06/01/22 131 lb (59.4 kg)  04/24/22 127 lb 14.4 oz (58 kg)     Kidney Function Lab Results  Component Value Date/Time   CREATININE 1.11 (H) 06/01/2022 12:00 AM   CREATININE 1.47 (H) 01/20/2022 01:30 PM   GFRNONAA 37 (L) 01/20/2022 01:30 PM   GFRAA 54 (L) 10/08/2019 02:19 PM       Latest Ref Rng & Units 06/01/2022   12:00 AM 01/20/2022    1:30 PM 12/12/2021    2:02 PM  BMP  Glucose 70 - 99 mg/dL 87  107  137   BUN 8 - 27 mg/dL 23  33  34   Creatinine 0.57 - 1.00 mg/dL 1.11  1.47  1.77   BUN/Creat Ratio 12 - '28 21   19   '$ Sodium 134 - 144 mmol/L 147  141  147   Potassium 3.5 - 5.2 mmol/L 4.1  4.5  4.6   Chloride 96 - 106 mmol/L 108  113  108   CO2 20 - 29 mmol/L '26  24  21   '$ Calcium 8.7 - 10.3 mg/dL 9.1  8.7  9.1      [facility] Clinical Pharmacist Assistant [phone]

## 2022-08-13 ENCOUNTER — Encounter: Payer: Self-pay | Admitting: Family Medicine

## 2022-08-15 ENCOUNTER — Other Ambulatory Visit: Payer: Self-pay | Admitting: Family Medicine

## 2022-08-15 MED ORDER — FENTANYL 25 MCG/HR TD PT72: 2.0000 | MEDICATED_PATCH | TRANSDERMAL | 0 refills | Status: DC

## 2022-08-15 NOTE — Telephone Encounter (Signed)
Requested medication (s) are due for refill today: Yes  Requested medication (s) are on the active medication list: Yes  Last refill:  06/27/22  Future visit scheduled: Yes  Notes to clinic:  Unable to refill per protocol, cannot delegate.      Requested Prescriptions  Pending Prescriptions Disp Refills   fentaNYL (DURAGESIC) 25 MCG/HR 20 patch 0    Sig: Place 2 patches onto the skin every 3 (three) days.     Not Delegated - Analgesics:  Opioid Agonists Failed - 08/15/2022 12:58 PM      Failed - This refill cannot be delegated      Failed - Urine Drug Screen completed in last 360 days      Passed - Valid encounter within last 3 months    Recent Outpatient Visits           2 months ago Essential hypertension   [place], Dionne Bucy, MD   3 months ago Insomnia, unspecified type   [place], Dionne Bucy, MD   6 months ago [place] My[place] Gip, DO   8 months ago Essential hypertension   [place] My[place] Gip, DO   10 months ago Encounter for annual wellness visit (AWV) in Medicare patient   [place], Dionne Bucy, MD       Future Appointments             In 1 week Ralene Bathe, MD [place]   In 3 months Bacigalupo, Dionne Bucy, MD [facility]

## 2022-08-15 NOTE — Telephone Encounter (Signed)
Patient called to f/u on her mychart message sent on 08/13/22, adv it is a 48-72hr (business days) turnaround time on medication refills so to give another day or so. She stated she is out of medication and has her last patch on now which she will need to change out soon. Patient request to send script to: [place] - [address] Phone: [phone]  Fax: [phone]

## 2022-08-24 IMAGING — CR DG CHEST 2V
1 series · 2 of 2 positions shown · non-contrast
Comparison: April 11, 2021.

CLINICAL DATA: Cough, shortness of breath.

EXAM:
CHEST - 2 VIEW

[Series 1: dg chest 2 view · 0.14mm/px · 2 of 2 slices shown]
[im 1/2]
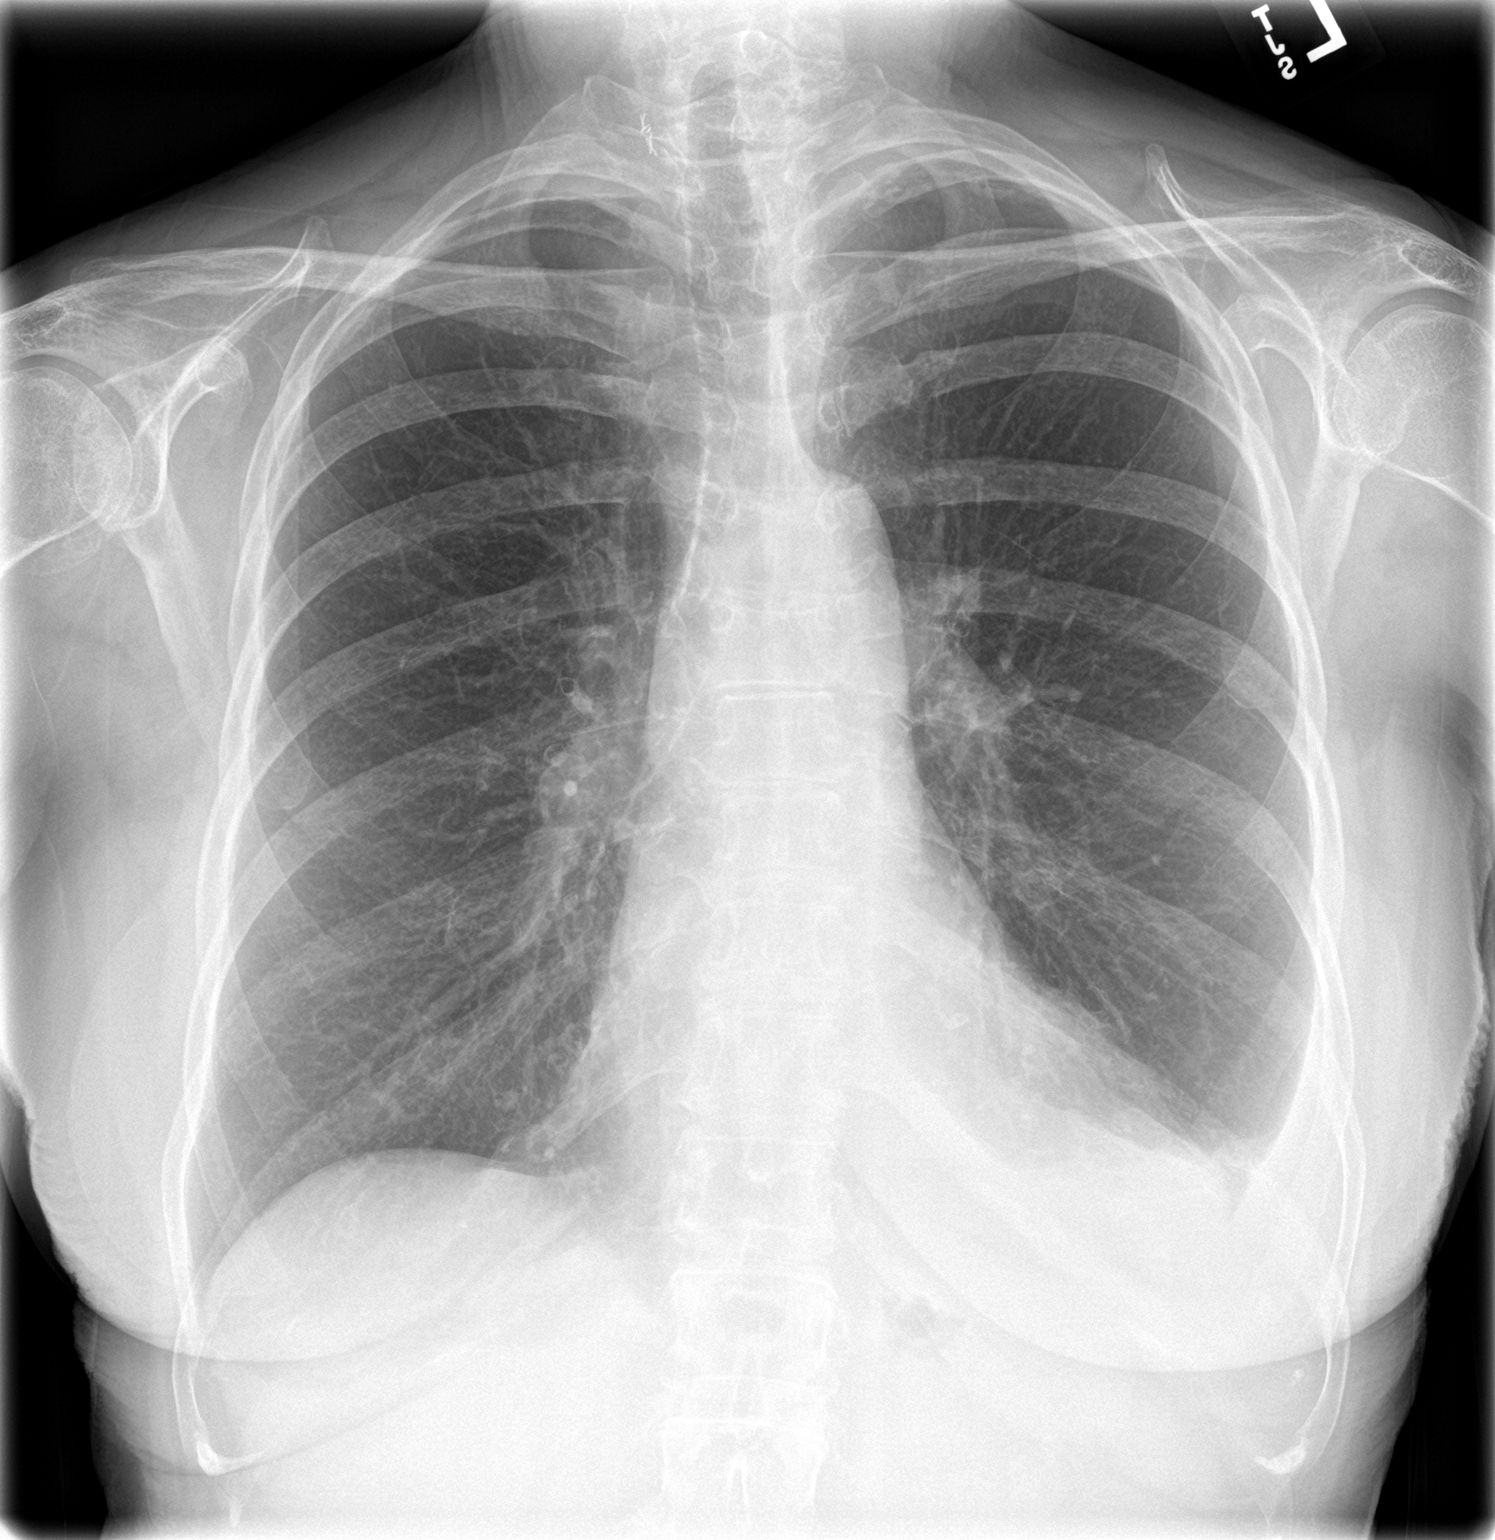
[im 2/2]
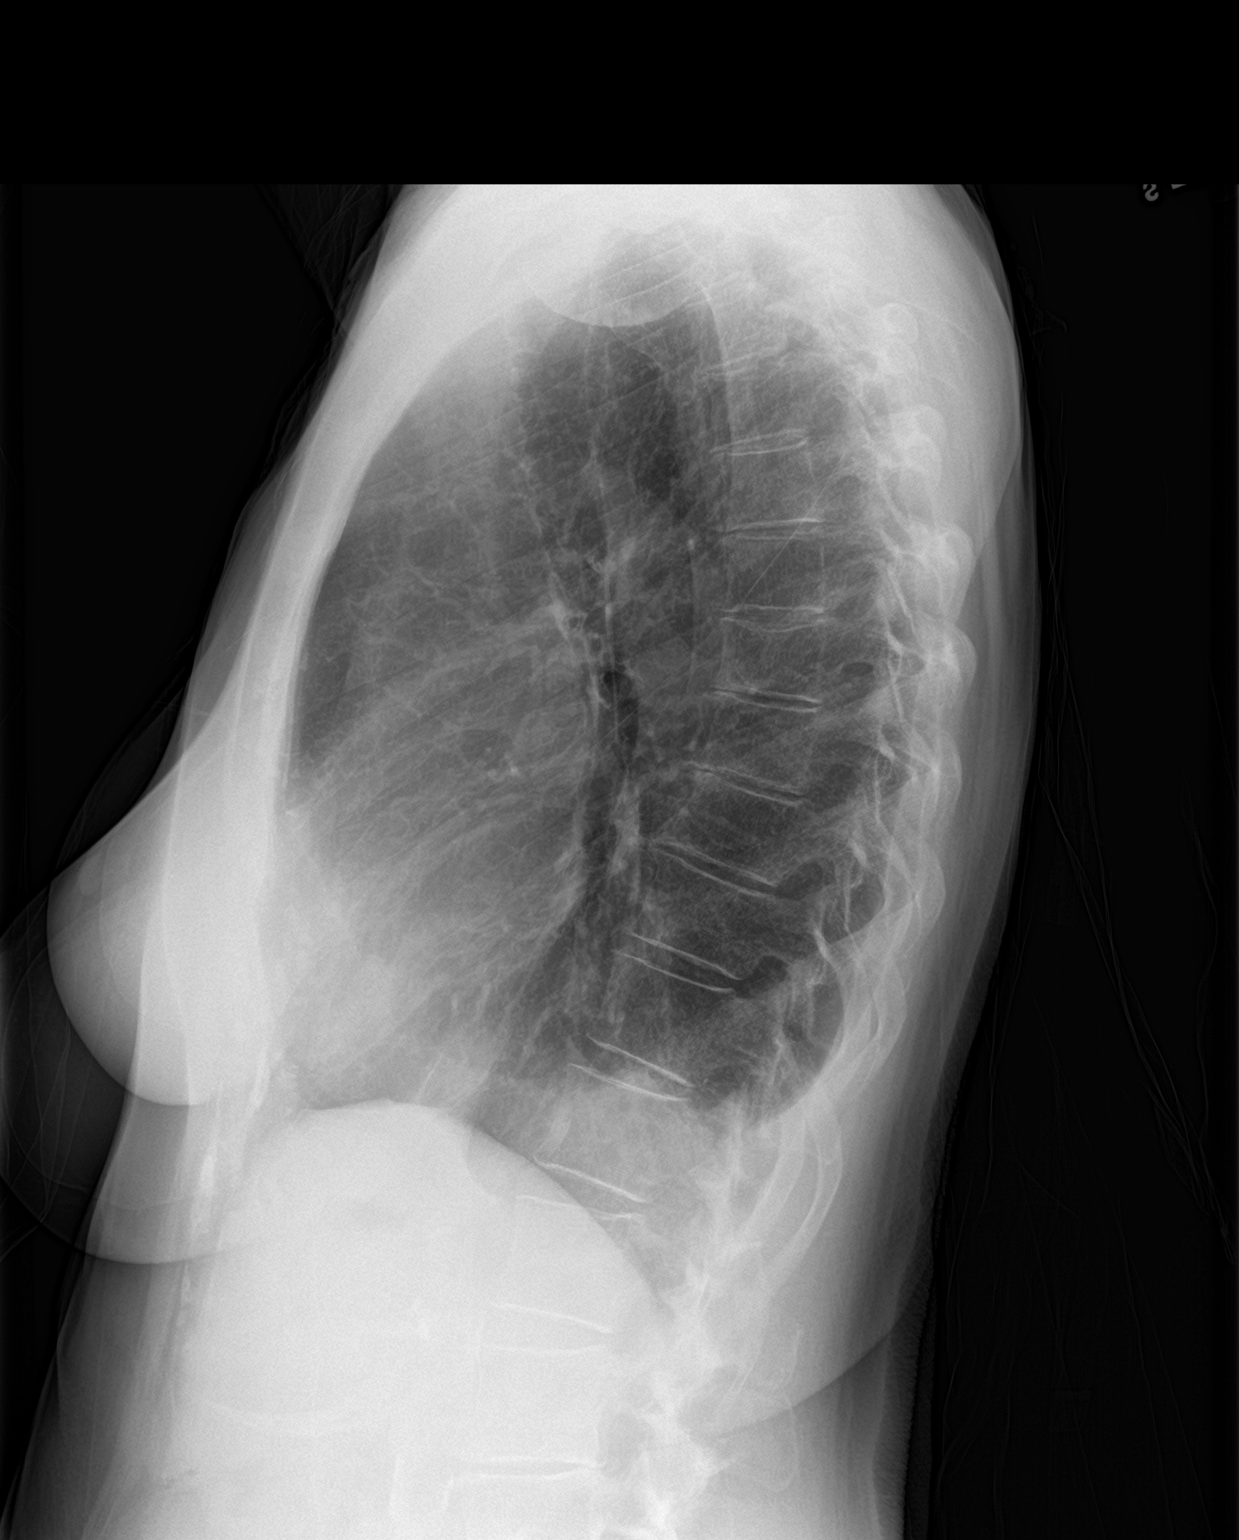

[2 of 2 positions shown; findings below may reference images not displayed]

FINDINGS: The heart size and mediastinal contours are within normal limits.
Right lung is clear. Mild left pleural effusion is noted with
associated atelectasis. The visualized skeletal structures are
unremarkable.
IMPRESSION: Mild left pleural effusion is noted with associated left basilar
atelectasis.

## 2022-08-28 ENCOUNTER — Telehealth: Payer: Self-pay | Admitting: Family Medicine

## 2022-08-28 ENCOUNTER — Ambulatory Visit: Payer: Medicare Other | Admitting: Dermatology

## 2022-08-28 ENCOUNTER — Encounter: Payer: Self-pay | Admitting: Dermatology

## 2022-08-28 VITALS — BP 160/95 | HR 64

## 2022-08-28 DIAGNOSIS — Z1283 Encounter for screening for malignant neoplasm of skin: Secondary | ICD-10-CM | POA: Diagnosis not present

## 2022-08-28 DIAGNOSIS — L578 Other skin changes due to chronic exposure to nonionizing radiation: Secondary | ICD-10-CM

## 2022-08-28 DIAGNOSIS — Z8589 Personal history of malignant neoplasm of other organs and systems: Secondary | ICD-10-CM

## 2022-08-28 DIAGNOSIS — D489 Neoplasm of uncertain behavior, unspecified: Secondary | ICD-10-CM

## 2022-08-28 DIAGNOSIS — D0461 Carcinoma in situ of skin of right upper limb, including shoulder: Secondary | ICD-10-CM | POA: Diagnosis not present

## 2022-08-28 DIAGNOSIS — Z85828 Personal history of other malignant neoplasm of skin: Secondary | ICD-10-CM

## 2022-08-28 DIAGNOSIS — L821 Other seborrheic keratosis: Secondary | ICD-10-CM

## 2022-08-28 DIAGNOSIS — L570 Actinic keratosis: Secondary | ICD-10-CM

## 2022-08-28 DIAGNOSIS — L820 Inflamed seborrheic keratosis: Secondary | ICD-10-CM | POA: Diagnosis not present

## 2022-08-28 DIAGNOSIS — L814 Other melanin hyperpigmentation: Secondary | ICD-10-CM

## 2022-08-28 DIAGNOSIS — D1801 Hemangioma of skin and subcutaneous tissue: Secondary | ICD-10-CM

## 2022-08-28 DIAGNOSIS — D229 Melanocytic nevi, unspecified: Secondary | ICD-10-CM

## 2022-08-28 DIAGNOSIS — Z86007 Personal history of in-situ neoplasm of skin: Secondary | ICD-10-CM

## 2022-08-28 NOTE — Telephone Encounter (Signed)
Contacted Fabienne Bruns to schedule their annual wellness visit. Appointment made for 10/17/2022.  [facility] Care Guide [facility] AWV TEAM Direct Dial: [phone]

## 2022-08-28 NOTE — Progress Notes (Signed)
Follow-Up Visit   Subjective  Dawn Bruce is a 77 y.o. female who presents for the following: Skin Cancer Screening and Full Body Skin Exam Left forehead ,  right forearm spots,  The patient presents for Total-Body Skin Exam (TBSE) for skin cancer screening and mole check. The patient has spots, moles and lesions to be evaluated, some may be new or changing and the patient has concerns that these could be cancer.   The following portions of the chart were reviewed this encounter and updated as appropriate: medications, allergies, medical history  Review of Systems:  No other skin or systemic complaints except as noted in HPI or Assessment and Plan.  Objective  Well appearing patient in no apparent distress; mood and affect are within normal limits.  A full examination was performed including scalp, head, eyes, ears, nose, lips, neck, chest, axillae, abdomen, back, buttocks, bilateral upper extremities, bilateral lower extremities, hands, feet, fingers, toes, fingernails, and toenails. All findings within normal limits unless otherwise noted below.   Relevant physical exam findings are noted in the Assessment and Plan.  right volar forearm 1.1 cm pink patch          Assessment & Plan   ACTINIC KERATOSIS Exam: Erythematous thin papules/macules with gritty scale  Actinic keratoses are precancerous spots that appear secondary to cumulative UV radiation exposure/sun exposure over time. They are chronic with expected duration over 1 year. A portion of actinic keratoses will progress to squamous cell carcinoma of the skin. It is not possible to reliably predict which spots will progress to skin cancer and so treatment is recommended to prevent development of skin cancer.  Recommend daily broad spectrum sunscreen SPF 30+ to sun-exposed areas, reapply every 2 hours as needed.  Recommend staying in the shade or wearing long sleeves, sun glasses (UVA+UVB protection) and wide brim hats  (4-inch brim around the entire circumference of the hat). Call for new or changing lesions.  Treatment Plan:  Prior to procedure, discussed risks of blister formation, small wound, skin dyspigmentation, or rare scar following cryotherapy. Recommend Vaseline ointment to treated areas while healing.  Destruction Procedure Note Destruction method: cryotherapy   Informed consent: discussed and consent obtained   Lesion destroyed using liquid nitrogen: Yes   Outcome: patient tolerated procedure well with no complications   Post-procedure details: wound care instructions given   Locations: right cheek x 2, right forearm x 1 # of Lesions Treated: 3  INFLAMED SEBORRHEIC KERATOSIS Exam: Erythematous keratotic or waxy stuck-on papule or plaque.  Symptomatic, irritating, patient would like treated.  Benign-appearing.  Call clinic for new or changing lesions.   Prior to procedure, discussed risks of blister formation, small wound, skin dyspigmentation, or rare scar following treatment. Recommend Vaseline ointment to treated areas while healing.  Destruction Procedure Note Destruction method: cryotherapy   Informed consent: discussed and consent obtained   Lesion destroyed using liquid nitrogen: Yes   Outcome: patient tolerated procedure well with no complications   Post-procedure details: wound care instructions given   Locations: left forehead x 1 # of Lesions Treated: 1  LENTIGINES, SEBORRHEIC KERATOSES, HEMANGIOMAS - Benign normal skin lesions - Benign-appearing - Call for any changes  MELANOCYTIC NEVI - Tan-brown and/or pink-flesh-colored symmetric macules and papules - Benign appearing on exam today - Observation - Call clinic for new or changing moles - Recommend daily use of broad spectrum spf 30+ sunscreen to sun-exposed areas.   Purpura - Chronic; persistent and recurrent.  Treatable, but not curable. -  Violaceous macules and patches - Benign - Related to trauma, age, sun  damage and/or use of blood thinners, chronic use of topical and/or oral steroids - Observe - Can use OTC arnica containing moisturizer such as Dermend Bruise Formula if desired - Call for worsening or other concerns  ACTINIC DAMAGE - Chronic condition, secondary to cumulative UV/sun exposure - diffuse scaly erythematous macules with underlying dyspigmentation - Recommend daily broad spectrum sunscreen SPF 30+ to sun-exposed areas, reapply every 2 hours as needed.  - Staying in the shade or wearing long sleeves, sun glasses (UVA+UVB protection) and wide brim hats (4-inch brim around the entire circumference of the hat) are also recommended for sun protection.  - Call for new or changing lesions.  HISTORY OF BASAL CELL CARCINOMA OF THE SKIN right upper back paraspinal 05/02/21  - No evidence of recurrence today - Recommend regular full body skin exams - Recommend daily broad spectrum sunscreen SPF 30+ to sun-exposed areas, reapply every 2 hours as needed.  - Call if any new or changing lesions are noted between office visits  HISTORY OF SQUAMOUS CELL CARCINOMA IN SITU OF THE SKIN Multiple sites see history  - No evidence of recurrence today - Recommend regular full body skin exams - Recommend daily broad spectrum sunscreen SPF 30+ to sun-exposed areas, reapply every 2 hours as needed.  - Call if any new or changing lesions are noted between office visits  HISTORY OF SQUAMOUS CELL CARCINOMA OF THE SKIN   09/10/19 left dorsum wrist ED&C 10/28/19, right dorsum hand 03/04/20  - No evidence of recurrence today - No lymphadenopathy - Recommend regular full body skin exams - Recommend daily broad spectrum sunscreen SPF 30+ to sun-exposed areas, reapply every 2 hours as needed.  - Call if any new or changing lesions are noted between office visits    SKIN CANCER SCREENING PERFORMED TODAY.    Neoplasm of uncertain behavior right volar forearm  Epidermal / dermal shaving  Lesion  diameter (cm):  1.1 Informed consent: discussed and consent obtained   Timeout: patient name, date of birth, surgical site, and procedure verified   Procedure prep:  Patient was prepped and draped in usual sterile fashion Prep type:  Isopropyl alcohol Anesthesia: the lesion was anesthetized in a standard fashion   Anesthetic:  1% lidocaine w/ epinephrine 1-100,000 buffered w/ 8.4% NaHCO3 Instrument used: flexible razor blade   Hemostasis achieved with: pressure, aluminum chloride and electrodesiccation   Outcome: patient tolerated procedure well   Post-procedure details: sterile dressing applied and wound care instructions given   Dressing type: bandage and petrolatum    Destruction of lesion Complexity: extensive   Destruction method: electrodesiccation and curettage   Informed consent: discussed and consent obtained   Timeout:  patient name, date of birth, surgical site, and procedure verified Procedure prep:  Patient was prepped and draped in usual sterile fashion Prep type:  Isopropyl alcohol Anesthesia: the lesion was anesthetized in a standard fashion   Anesthetic:  1% lidocaine w/ epinephrine 1-100,000 buffered w/ 8.4% NaHCO3 Curettage performed in three different directions: Yes   Electrodesiccation performed over the curetted area: Yes   Lesion length (cm):  1.1 Lesion width (cm):  1.1 Margin per side (cm):  0.2 Final wound size (cm):  1.5 Hemostasis achieved with:  pressure, aluminum chloride and electrodesiccation Outcome: patient tolerated procedure well with no complications   Post-procedure details: sterile dressing applied and wound care instructions given   Dressing type: bandage and petrolatum  Specimen 1 - Surgical pathology Differential Diagnosis: r/o scc   Check Margins: No  R/o scc   Return in about 6 months (around 02/27/2023) for TBSE.  IAsher Muir, CMA, am acting as scribe for Armida Sans, MD.    Documentation: I have reviewed the above  documentation for accuracy and completeness, and I agree with the above.  Armida Sans, MD

## 2022-08-28 NOTE — Patient Instructions (Addendum)
  Biopsy Wound Care Instructions  Leave the original bandage on for 24 hours if possible.  If the bandage becomes soaked or soiled before that time, it is OK to remove it and examine the wound.  A small amount of post-operative bleeding is normal.  If excessive bleeding occurs, remove the bandage, place gauze over the site and apply continuous pressure (no peeking) over the area for 30 minutes. If this does not work, please call our clinic as soon as possible or page your doctor if it is after hours.   Once a day, cleanse the wound with soap and water. It is fine to shower. If a thick crust develops you may use a Q-tip dipped into dilute hydrogen peroxide (mix 1:1 with water) to dissolve it.  Hydrogen peroxide can slow the healing process, so use it only as needed.    After washing, apply petroleum jelly (Vaseline) or an antibiotic ointment if your doctor prescribed one for you, followed by a bandage.    For best healing, the wound should be covered with a layer of ointment at all times. If you are not able to keep the area covered with a bandage to hold the ointment in place, this may mean re-applying the ointment several times a day.  Continue this wound care until the wound has healed and is no longer open.   Itching and mild discomfort is normal during the healing process. However, if you develop pain or severe itching, please call our office.   If you have any discomfort, you can take Tylenol (acetaminophen) or ibuprofen as directed on the bottle. (Please do not take these if you have an allergy to them or cannot take them for another reason).  Some redness, tenderness and white or yellow material in the wound is normal healing.  If the area becomes very sore and red, or develops a thick yellow-green material (pus), it may be infected; please notify us.    If you have stitches, return to clinic as directed to have the stitches removed. You will continue wound care for 2-3 days after the  stitches are removed.   Wound healing continues for up to one year following surgery. It is not unusual to experience pain in the scar from time to time during the interval.  If the pain becomes severe or the scar thickens, you should notify the office.    A slight amount of redness in a scar is expected for the first six months.  After six months, the redness will fade and the scar will soften and fade.  The color difference becomes less noticeable with time.  If there are any problems, return for a post-op surgery check at your earliest convenience.  To improve the appearance of the scar, you can use silicone scar gel, cream, or sheets (such as Mederma or Serica) every night for up to one year. These are available over the counter (without a prescription).  Please call our office at [phone] for any questions or concerns.    Electrodesiccation and Curettage ("Scrape and Burn") Wound Care Instructions  Leave the original bandage on for 24 hours if possible.  If the bandage becomes soaked or soiled before that time, it is OK to remove it and examine the wound.  A small amount of post-operative bleeding is normal.  If excessive bleeding occurs, remove the bandage, place gauze over the site and apply continuous pressure (no peeking) over the area for 30 minutes. If this does not work,   our clinic as soon as possible or page your doctor if it is after hours.   Once a day, cleanse the wound with soap and water. It is fine to shower. If a thick crust develops you may use a Q-tip dipped into dilute hydrogen peroxide (mix 1:1 with water) to dissolve it.  Hydrogen peroxide can slow the healing process, so use it only as needed.    After washing, apply petroleum jelly (Vaseline) or an antibiotic ointment if your doctor prescribed one for you, followed by a bandage.    For best healing, the wound should be covered with a layer of ointment at all times. If you are not able to keep the area covered  with a bandage to hold the ointment in place, this may mean re-applying the ointment several times a day.  Continue this wound care until the wound has healed and is no longer open. It may take several weeks for the wound to heal and close.  Itching and mild discomfort is normal during the healing process.  If you have any discomfort, you can take Tylenol (acetaminophen) or ibuprofen as directed on the bottle. (Please do not take these if you have an allergy to them or cannot take them for another reason).  Some redness, tenderness and white or yellow material in the wound is normal healing.  If the area becomes very sore and red, or develops a thick yellow-green material (pus), it may be infected; please notify us.    Wound healing continues for up to one year following surgery. It is not unusual to experience pain in the scar from time to time during the interval.  If the pain becomes severe or the scar thickens, you should notify the office.    A slight amount of redness in a scar is expected for the first six months.  After six months, the redness will fade and the scar will soften and fade.  The color difference becomes less noticeable with time.  If there are any problems, return for a post-op surgery check at your earliest convenience.  To improve the appearance of the scar, you can use silicone scar gel, cream, or sheets (such as Mederma or Serica) every night for up to one year. These are available over the counter (without a prescription).  Please call our office at [phone] for any questions or concerns.  Cryotherapy Aftercare  Wash gently with soap and water everyday.   Apply Vaseline and Band-Aid daily until healed.   Actinic keratoses are precancerous spots that appear secondary to cumulative UV radiation exposure/sun exposure over time. They are chronic with expected duration over 1 year. A portion of actinic keratoses will progress to squamous cell carcinoma of the skin. It  is not possible to reliably predict which spots will progress to skin cancer and so treatment is recommended to prevent development of skin cancer.  Recommend daily broad spectrum sunscreen SPF 30+ to sun-exposed areas, reapply every 2 hours as needed.  Recommend staying in the shade or wearing long sleeves, sun glasses (UVA+UVB protection) and wide brim hats (4-inch brim around the entire circumference of the hat). Call for new or changing lesions.   Seborrheic Keratosis  What causes seborrheic keratoses? Seborrheic keratoses are harmless, common skin growths that first appear during adult life.  As time goes by, more growths appear.  Some people may develop a large number of them.  Seborrheic keratoses appear on both covered and uncovered body parts.  They are not caused  by sunlight.  The tendency to develop seborrheic keratoses can be inherited.  They vary in color from skin-colored to gray, brown, or even black.  They can be either smooth or have a rough, warty surface.   Seborrheic keratoses are superficial and look as if they were stuck on the skin.  Under the microscope this type of keratosis looks like layers upon layers of skin.  That is why at times the top layer may seem to fall off, but the rest of the growth remains and re-grows.    Treatment Seborrheic keratoses do not need to be treated, but can easily be removed in the office.  Seborrheic keratoses often cause symptoms when they rub on clothing or jewelry.  Lesions can be in the way of shaving.  If they become inflamed, they can cause itching, soreness, or burning.  Removal of a seborrheic keratosis can be accomplished by freezing, burning, or surgery. If any spot bleeds, scabs, or grows rapidly, please return to have it checked, as these can be an indication of a skin cancer.      Melanoma ABCDEs  Melanoma is the most dangerous type of skin cancer, and is the leading cause of death from skin disease.  You are more likely to  develop melanoma if you: Have light-colored skin, light-colored eyes, or red or blond hair Spend a lot of time in the sun Tan regularly, either outdoors or in a tanning bed Have had blistering sunburns, especially during childhood Have a close family member who has had a melanoma Have atypical moles or large birthmarks  Early detection of melanoma is key since treatment is typically straightforward and cure rates are extremely high if we catch it early.   The first sign of melanoma is often a change in a mole or a new dark spot.  The ABCDE system is a way of remembering the signs of melanoma.  A for asymmetry:  The two halves do not match. B for border:  The edges of the growth are irregular. C for color:  A mixture of colors are present instead of an even brown color. D for diameter:  Melanomas are usually (but not always) greater than 11mm - the size of a pencil eraser. E for evolution:  The spot keeps changing in size, shape, and color.  Please check your skin once per month between visits. You can use a small mirror in front and a large mirror behind you to keep an eye on the back side or your body.   If you see any new or changing lesions before your next follow-up, please call to schedule a visit.  Please continue daily skin protection including broad spectrum sunscreen SPF 30+ to sun-exposed areas, reapplying every 2 hours as needed when you're outdoors.   Staying in the shade or wearing long sleeves, sun glasses (UVA+UVB protection) and wide brim hats (4-inch brim around the entire circumference of the hat) are also recommended for sun protection.    Due to recent changes in healthcare laws, you may see results of your pathology and/or laboratory studies on MyChart before the doctors have had a chance to review them. We understand that in some cases there may be results that are confusing or concerning to you. Please understand that not all results are received at the same time and  often the doctors may need to interpret multiple results in order to provide you with the best plan of care or course of treatment. Therefore, we ask that  care or course of treatment. Therefore, we ask that you please give us 2 business days to thoroughly review all your results before contacting the office for clarification. Should we see a critical lab result, you will be contacted sooner.   If You Need Anything After Your Visit  If you have any questions or concerns for your doctor, please call our main line at [phone] and press option 4 to reach your doctor's medical assistant. If no one answers, please leave a voicemail as directed and we will return your call as soon as possible. Messages left after 4 pm will be answered the following business day.   You may also send us a message via MyChart. We typically respond to MyChart messages within 1-2 business days.  For prescription refills, please ask your pharmacy to contact our office. Our fax number is [phone].  If you have an urgent issue when the clinic is closed that cannot wait until the next business day, you can page your doctor at the number below.    Please note that while we do our best to be available for urgent issues outside of office hours, we are not available 24/7.   If you have an urgent issue and are unable to reach us, you may choose to seek medical care at your doctor's office, retail clinic, urgent care center, or emergency room.  If you have a medical emergency, please immediately call 911 or go to the emergency department.  Pager Numbers  - Dr. Kowalski: [phone]  - Dr. Moye: [phone]  - Dr. Stewart: [phone]  In the event of inclement weather, please call our main line at [phone] for an update on the status of any delays or closures.  Dermatology Medication Tips: Please keep the boxes that topical medications come in in order to help keep track of the instructions about where and how to use these. Pharmacies  typically print the medication instructions only on the boxes and not directly on the medication tubes.   If your medication is too expensive, please contact our office at [phone] option 4 or send us a message through MyChart.   We are unable to tell what your co-pay for medications will be in advance as this is different depending on your insurance coverage. However, we may be able to find a substitute medication at lower cost or fill out paperwork to get insurance to cover a needed medication.   If a prior authorization is required to get your medication covered by your insurance company, please allow us 1-2 business days to complete this process.  Drug prices often vary depending on where the prescription is filled and some pharmacies may offer cheaper prices.  The website www.goodrx.com contains coupons for medications through different pharmacies. The prices here do not account for what the cost may be with help from insurance (it may be cheaper with your insurance), but the website can give you the price if you did not use any insurance.  - You can print the associated coupon and take it with your prescription to the pharmacy.  - You may also stop by our office during regular business hours and pick up a GoodRx coupon card.  - If you need your prescription sent electronically to a different pharmacy, notify our office through [facility] MyChart or by phone at [phone] option 4.     Si Usted Necesita Algo Despus de Su Visita  Tambin puede enviarnos un mensaje a travs de MyChart. Por lo general respondemos   a los mensajes de MyChart en el transcurso de 1 a 2 das hbiles.  Para renovar recetas, por favor pida a su farmacia que se ponga en contacto con nuestra oficina. Nuestro nmero de fax es el [phone].  Si tiene un asunto urgente cuando la clnica est cerrada y que no puede esperar hasta el siguiente da hbil, puede llamar/localizar a su doctor(a) al nmero que  aparece a continuacin.   Por favor, tenga en cuenta que aunque hacemos todo lo posible para estar disponibles para asuntos urgentes fuera del horario de oficina, no estamos disponibles las 24 horas del da, los 7 das de la semana.   Si tiene un problema urgente y no puede comunicarse con nosotros, puede optar por buscar atencin mdica  en el consultorio de su doctor(a), en una clnica privada, en un centro de atencin urgente o en una sala de emergencias.  Si tiene una emergencia mdica, por favor llame inmediatamente al 911 o vaya a la sala de emergencias.  Nmeros de bper  - Dr. Kowalski: [phone]  - Dra. Moye: [phone]  - Dra. Stewart: [phone]  En caso de inclemencias del tiempo, por favor llame a nuestra lnea principal al [phone] para una actualizacin sobre el estado de cualquier retraso o cierre.  Consejos para la medicacin en dermatologa: Por favor, guarde las cajas en las que vienen los medicamentos de uso tpico para ayudarle a seguir las instrucciones sobre dnde y cmo usarlos. Las farmacias generalmente imprimen las instrucciones del medicamento slo en las cajas y no directamente en los tubos del medicamento.   Si su medicamento es muy caro, por favor, pngase en contacto con nuestra oficina llamando al [phone] y presione la opcin 4 o envenos un mensaje a travs de MyChart.   No podemos decirle cul ser su copago por los medicamentos por adelantado ya que esto es diferente dependiendo de la cobertura de su seguro. Sin embargo, es posible que podamos encontrar un medicamento sustituto a menor costo o llenar un formulario para que el seguro cubra el medicamento que se considera necesario.   Si se requiere una autorizacin previa para que su compaa de seguros cubra su medicamento, por favor permtanos de 1 a 2 das hbiles para completar este proceso.  Los precios de los medicamentos varan con frecuencia dependiendo del lugar de dnde se surte  la receta y alguna farmacias pueden ofrecer precios ms baratos.  El sitio web www.goodrx.com tiene cupones para medicamentos de diferentes farmacias. Los precios aqu no tienen en cuenta lo que podra costar con la ayuda del seguro (puede ser ms barato con su seguro), pero el sitio web puede darle el precio si no utiliz ningn seguro.  - Puede imprimir el cupn correspondiente y llevarlo con su receta a la farmacia.  - Tambin puede pasar por nuestra oficina durante el horario de atencin regular y recoger una tarjeta de cupones de GoodRx.  - Si necesita que su receta se enve electrnicamente a una farmacia diferente, informe a nuestra oficina a travs de MyChart de [facility] o por telfono llamando al [phone] y presione la opcin 4.  

## 2022-09-04 ENCOUNTER — Telehealth: Payer: Self-pay

## 2022-09-04 DIAGNOSIS — N1831 Chronic kidney disease, stage 3a: Secondary | ICD-10-CM | POA: Diagnosis not present

## 2022-09-04 NOTE — Telephone Encounter (Signed)
-----   Message from Deirdre Evener, MD sent at 08/31/2022  5:24 PM EDT ----- Diagnosis Skin , right volar forearm SQUAMOUS CELL CARCINOMA IN SITU  Cancer = SCCis Superficial Already treated Recheck next visit

## 2022-09-04 NOTE — Telephone Encounter (Signed)
Advised patient of results/hd  

## 2022-09-07 ENCOUNTER — Ambulatory Visit: Payer: Medicare Other | Admitting: Dermatology

## 2022-09-11 ENCOUNTER — Encounter: Payer: Self-pay | Admitting: Dermatology

## 2022-09-19 DIAGNOSIS — Z7901 Long term (current) use of anticoagulants: Secondary | ICD-10-CM | POA: Diagnosis not present

## 2022-09-19 DIAGNOSIS — I25118 Atherosclerotic heart disease of native coronary artery with other forms of angina pectoris: Secondary | ICD-10-CM | POA: Diagnosis not present

## 2022-09-19 DIAGNOSIS — I10 Essential (primary) hypertension: Secondary | ICD-10-CM | POA: Diagnosis not present

## 2022-09-19 DIAGNOSIS — I4819 Other persistent atrial fibrillation: Secondary | ICD-10-CM | POA: Diagnosis not present

## 2022-09-20 ENCOUNTER — Other Ambulatory Visit: Payer: Self-pay | Admitting: Family Medicine

## 2022-09-20 DIAGNOSIS — E782 Mixed hyperlipidemia: Secondary | ICD-10-CM

## 2022-09-21 ENCOUNTER — Encounter: Payer: Self-pay | Admitting: Family Medicine

## 2022-09-21 ENCOUNTER — Other Ambulatory Visit: Payer: Self-pay

## 2022-09-21 MED ORDER — TRAZODONE HCL 50 MG PO TABS: 25.0000 mg | ORAL_TABLET | Freq: Every evening | ORAL | 3 refills | Status: DC | PRN

## 2022-09-25 ENCOUNTER — Telehealth: Payer: Self-pay

## 2022-09-25 NOTE — Telephone Encounter (Signed)
Care Management & Coordination Services Outreach Note  09/25/2022 Name: Dawn Bruce MRN: [id number] DOB: [birth year]  Referred by: Erasmo Downer, MD  Patient had a phone appointment scheduled with clinical pharmacist today.  An unsuccessful telephone outreach was attempted today. The patient was referred to the pharmacist for assistance with medications, care management and care coordination.   Patient will NOT be penalized in any way for missing a Care Management & Coordination Services appointment. The no-show fee does not apply.  Angelena Sole, PharmD, [place], CPP  Clinical Pharmacist Practitioner  [facility] [phone]

## 2022-09-25 NOTE — Progress Notes (Unsigned)
Care Management & Coordination Services Pharmacy Note  09/25/2022 Name:  Dawn Bruce MRN:  [id number] DOB:  04/30/1946  Summary: Patient presents for follow-up consult.     Recommendations/Changes made from today's visit: -START Ezetimibe 10 mg daily  -Counseled to monitor blood pressure weekly.   Follow up plan: CPP follow-up 3 months   Recommended Problem List Changes:  Add: Persistent atrial fibrillation    Subjective: Dawn Bruce is an 77 y.o. year old female who is a primary patient of Bacigalupo, Marzella Schlein, MD.  The care coordination team was consulted for assistance with disease management and care coordination needs.    Engaged with patient by telephone for initial visit.  Recent office visits: 06/01/2022 Dr. Beryle Flock MD (PCP)  No medication Changes noted, Return in about 6 months  04/27/2022 Dr. Beryle Flock MD (PCP) Start Trazodone 50 MG tablet PRN 02/15/2022 Ellwood Dense DO (PCP Office) start nirmatrelvir/ritonavir EUA, renal dosing, (PAXLOVID) 10 x 150 MG & 10 x 100MG  TABS  Recent consult visits: 09/19/22 Dr. Whitney Post MD (Cardiology). Carvedilol 12.5 mg twice daily.  09/04/22: Patient presented to Dr. Alyson Locket (Nephrology)  Hospital visits: Admitted to the hospital on 03/08/22 for PCI   Admitted to the hospital on 01/20/2022 due to AKI (acute kidney injury)  Discharge date was 01/20/2022. Discharged from [facility] Emergency department [facility].   Objective:  Lab Results  Component Value Date   CREATININE 1.11 (H) 06/01/2022   BUN 23 06/01/2022   EGFR 51 (L) 06/01/2022   GFRNONAA 37 (L) 01/20/2022   GFRAA 54 (L) 10/08/2019   NA 147 (H) 06/01/2022   K 4.1 06/01/2022   CALCIUM 9.1 06/01/2022   CO2 26 06/01/2022   GLUCOSE 87 06/01/2022    Lab Results  Component Value Date/Time   HGBA1C 5.5 06/01/2022 12:00 AM   HGBA1C 5.5 12/12/2021 02:02 PM    Last diabetic Eye exam: No results found for: "HMDIABEYEEXA"  Last diabetic Foot exam: No results found  for: "HMDIABFOOTEX"   Lab Results  Component Value Date   CHOL 156 06/01/2022   HDL 73 06/01/2022   LDLCALC 73 06/01/2022   TRIG 46 06/01/2022   CHOLHDL 2.1 06/01/2022       Latest Ref Rng & Units 06/01/2022   12:00 AM 09/01/2021    4:55 AM 08/31/2021    7:42 AM  Hepatic Function  Total Protein 6.0 - 8.5 g/dL 6.3  5.6  6.4   Albumin 3.8 - 4.8 g/dL 3.8  2.2  2.5   AST 0 - 40 IU/L 31  22  23    ALT 0 - 32 IU/L 14  13  14    Alk Phosphatase 44 - 121 IU/L 84  66  76   Total Bilirubin 0.0 - 1.2 mg/dL 0.5  0.9  0.7     Lab Results  Component Value Date/Time   TSH 2.68 01/03/2018 12:00 AM   TSH 2.68 01/03/2018 12:00 AM       Latest Ref Rng & Units 06/01/2022   12:00 AM 01/20/2022    1:30 PM 09/02/2021    4:19 AM  CBC  WBC 3.4 - 10.8 x10E3/uL 5.0  4.7  11.2   Hemoglobin 11.1 - 15.9 g/dL 78.2  95.6  21.3   Hematocrit 34.0 - 46.6 % 40.5  47.5  37.3   Platelets 150 - 450 x10E3/uL 132  105  209     Lab Results  Component Value Date/Time   VD25OH 40.9 01/03/2018 12:00 AM   VD25OH 40.9 01/03/2018  12:00 AM   VITAMINB12 444 06/06/2018 09:33 AM    Clinical ASCVD: Yes  The 10-year ASCVD risk score (Arnett DK, et al., 2019) is: 36.1%   Values used to calculate the score:     Age: 17 years     Sex: Female     Is Non-Hispanic African American: No     Diabetic: No     Tobacco smoker: No     Systolic Blood Pressure: 145 mmHg     Is BP treated: Yes     HDL Cholesterol: 73 mg/dL     Total Cholesterol: 156 mg/dL       1/61/0960    4:54 PM 06/01/2022    1:29 PM 04/24/2022    4:58 PM  Depression screen PHQ 2/9  Decreased Interest 0 0 0  Down, Depressed, Hopeless 0 0 0  PHQ - 2 Score 0 0 0  Altered sleeping 0 0 1  Tired, decreased energy 0 0 0  Change in appetite 0 0 0  Feeling bad or failure about yourself  0 0 0  Trouble concentrating 0 0 0  Moving slowly or fidgety/restless 0 0 0  Suicidal thoughts 0 0 0  PHQ-9 Score 0 0 1  Difficult doing work/chores  Not difficult at all  Not difficult at all     Social History   Tobacco Use  Smoking Status Former   Packs/day: 0.50   Years: 5.00   Additional pack years: 0.00   Total pack years: 2.50   Types: Cigarettes   Quit date: 26   Years since quitting: 34.3   Passive exposure: Past  Smokeless Tobacco Never  Tobacco Comments   quit around 1990-1995 (?)   BP Readings from Last 3 Encounters:  08/28/22 (!) 160/95  06/01/22 105/65  01/20/22 (!) 154/69   Pulse Readings from Last 3 Encounters:  08/28/22 64  06/01/22 62  01/20/22 65   Wt Readings from Last 3 Encounters:  06/05/22 131 lb 3.2 oz (59.5 kg)  06/01/22 131 lb (59.4 kg)  04/24/22 127 lb 14.4 oz (58 kg)   BMI Readings from Last 3 Encounters:  06/05/22 22.88 kg/m  06/01/22 22.84 kg/m  04/24/22 22.30 kg/m    No Known Allergies  Medications Reviewed Today     Reviewed by Deirdre Evener, MD (Physician) on 09/11/22 at 1717  Med List Status: <None>   Medication Order Taking? Sig Documenting Provider Last Dose Status Informant  amLODipine (NORVASC) 2.5 MG tablet [id number] Yes Take 2.5 mg by mouth daily. [provider] Taking Active   apixaban (ELIQUIS) 2.5 MG TABS tablet [id number] Yes Take 2.5 mg by mouth 2 (two) times daily. [provider] Taking Active   atorvastatin (LIPITOR) 80 MG tablet [id number] Yes Take 80 mg by mouth daily. [provider] Taking Active   carvedilol (COREG) 6.25 MG tablet [id number] Yes Take 1 tablet (6.25 mg total) by mouth 2 (two) times daily. Erasmo Downer, MD Taking Active   clopidogrel (PLAVIX) 75 MG tablet [id number] Yes Take 75 mg by mouth daily. [provider] Taking Active   ezetimibe (ZETIA) 10 MG tablet [id number] Yes Take 1 tablet (10 mg total) by mouth daily. Erasmo Downer, MD Taking Active   famotidine (PEPCID) 40 MG tablet [id number] Yes Take 1 tablet (40 mg total) by mouth daily. Bacigalupo, Marzella Schlein, MD Taking Active Self, Pharmacy Records            Med Note [place], [place]  Apr 10, 2022 11:06 AM) Taking as needed  fentaNYL (DURAGESIC) 25 MCG/HR [id number] Yes Place 2 patches onto the skin every 3 (three) days. Simmons-Robinson, Makiera, MD Taking Active   pregabalin (LYRICA) 150 MG capsule [id number] Yes Take 1 capsule (150 mg total) by mouth 2 (two) times daily. Erasmo Downer, MD Taking Active   traZODone (DESYREL) 50 MG tablet [id number] Yes Take 0.5-1 tablets (25-50 mg total) by mouth at bedtime as needed for sleep. Erasmo Downer, MD Taking Active   valsartan (DIOVAN) 160 MG tablet [id number] Yes Take 160 mg by mouth in the morning and at bedtime. [provider] Taking Active Self, Pharmacy Records            SDOH:  (Social Determinants of Health) assessments and interventions performed: Yes SDOH Interventions    Flowsheet Row Care Coordination from 06/20/2022 in [facility] from 04/24/2022 in [facility] Clinical Support from 04/20/2020 in [facility]  SDOH Interventions     Transportation Interventions Intervention Not Indicated -- --  Depression Interventions/Treatment  -- GEX5-2 Score <4 Follow-up Not Indicated --  Financial Strain Interventions Intervention Not Indicated -- --  Physical Activity Interventions -- -- --  [84 minutes]       Medication Assistance: None required.  Patient affirms current coverage meets needs.  Medication Access: Within the past 30 days, how often has patient missed a dose of medication? None Is a pillbox or other method used to improve adherence? Yes  Factors that may affect medication adherence? no barriers identified Are meds synced by current pharmacy? No  Are meds delivered by current pharmacy? No  Does patient experience delays in picking up medications due to transportation concerns? No   [facility] Reviewed: Is patient disadvantaged to use [facility]?: Yes  Current Rx  insurance plan: BCBS Name and location of Current pharmacy:  [id number] - [place], [place] - [address] [place] [address] [place] [address] Phone: [phone] Fax: [phone]  [id number] - [place], [place] - [address] AT [facility] [address] [facility] [address] [facility] [address] [place] [address] Phone: [phone] Fax: [phone]  [facility] services reviewed with patient today?: No  Patient requests to transfer care to [facility]?: No  Reason patient declined to change pharmacies: Disadvantaged due to insurance/mail order and Travels frequently  Compliance/Adherence/Medication fill history: Care Gaps: Medication:    Atorvastatin 80 mg      Last Fill: 06/06/2022 [facility]. Medication:    Valsartan 160 mg        Last Fill: 03/16/2022 [facility] pharmacy.   Star-Rating Drugs: Dtap Vaccine Shingrix Vaccine  Covid-19 Vaccine  Assessment/Plan   Heart Failure (Goal: manage symptoms and prevent exacerbations) -Controlled -Last ejection fraction: 50-55% (Date: May 2022); previously 35-40%  -HF type: HFimpEF (EF improved from <40% to > 40%) -NYHA Class: II (slight limitation of activity) -AHA HF Stage: C (Heart disease and symptoms present) -Current treatment: Amlodipine 2.5 mg daily  Carvedilol 6.25 mg twice daily  Valsartan 160 mg twice daily  -Medications previously tried: Jardiance + Furosemide (AKI)   -Current home BP/HR readings: Has a monitor, rarely uses.  -Some dizziness with postural changes.  -Current home daily weights: Monitors sporadically.  -Current dietary habits: [place] to drinks 24 oz of water daily. 1/2 diet pepsi with lunch -  Given improvement in renal function, could consider re-initiation of SGLT2 inhibitor. Defer  -Counseled to monitor blood pressure weekly.  -Recommended to continue current medication  Atrial Fibrillation (Goal:  prevent stroke and major bleeding) -Controlled -CHADSVASC: 6 -Current treatment: Rate control: Carvedilol 6.25 mg twice daily  Anticoagulation: Eliquis 2.5 mg twice daily  -Medications previously tried: NA -Frequent bruising due to Eliquis + clopidogrel. She states she was instructed by Cardiology to decrease Eliquis to 2.5 mg twice daily to minimize bleeding risk -Recommended to continue current medication  Hyperlipidemia: (LDL goal < 55) -Uncontrolled -PCI of LAD Oct 2023 -Current treatment: Atorvastatin 80 mg daily  Ezetimibe 10 mg daily  -Current treatment: Clopidogrel 75 mg daily  6 month therapy duration  -Medications previously tried: NA -ACC/AHA guidelines would recommend more stringent LDL goal given patient's very high cardiac risk. Patient's LDL improved from 108 to 73 after Atorvastatin was increased to 80 mg.  -START Ezetimibe 10 mg daily   Angelena Sole, PharmD, [place], CPP  Clinical Pharmacist Practitioner  [facility] [phone]

## 2022-09-26 NOTE — Telephone Encounter (Signed)
Pt is calling back to speak with Trinna Post. Please have Alex call pt back.

## 2022-09-27 NOTE — Telephone Encounter (Signed)
Please reschedule patient.

## 2022-10-09 ENCOUNTER — Encounter: Payer: Self-pay | Admitting: Family Medicine

## 2022-10-10 ENCOUNTER — Other Ambulatory Visit: Payer: Self-pay

## 2022-10-10 MED ORDER — FENTANYL 25 MCG/HR TD PT72: 2.0000 | MEDICATED_PATCH | TRANSDERMAL | 0 refills | Status: DC

## 2022-10-10 NOTE — Telephone Encounter (Signed)
LOV 06/01/22 NOV 12/07/22 LRF 08/15/22 20 x 0

## 2022-10-12 ENCOUNTER — Telehealth: Payer: Self-pay

## 2022-10-12 NOTE — Telephone Encounter (Signed)
Copied from CRM [id number]. Topic: General - Inquiry >> Oct 12, 2022  3:08 PM De Blanch wrote: Reason for CRM: Johney Frame MEDICARE Stated fentaNYL patch has been approved with a QTY of 20 per 30 days for Medicare D. Start today, valid for a full year.  Will be sending a fax with information as well.  Please advise.

## 2022-10-17 ENCOUNTER — Ambulatory Visit (INDEPENDENT_AMBULATORY_CARE_PROVIDER_SITE_OTHER): Payer: Medicare Other

## 2022-10-17 VITALS — Ht 63.0 in | Wt 127.0 lb

## 2022-10-17 DIAGNOSIS — Z0000 Encounter for general adult medical examination without abnormal findings: Secondary | ICD-10-CM

## 2022-10-17 NOTE — Patient Instructions (Signed)
Dawn Bruce , Thank you for taking time to come for your Medicare Wellness Visit. I appreciate your ongoing commitment to your health goals. Please review the following plan we discussed and let me know if I can assist you in the future.   These are the goals we discussed:  Goals      DIET - INCREASE WATER INTAKE     Recommend to drink at least 6-8 8oz glasses of water per day.        This is a list of the screening recommended for you and due dates:  Health Maintenance  Topic Date Due   DTaP/Tdap/Td vaccine (2 - Td or Tdap) 09/27/2017   Zoster (Shingles) Vaccine (2 of 2) 12/27/2017   COVID-19 Vaccine (3 - Pfizer risk series) 07/21/2019   Flu Shot  12/21/2022   Medicare Annual Wellness Visit  10/17/2023   Pneumonia Vaccine  Completed   DEXA scan (bone density measurement)  Completed   Hepatitis C Screening  Completed   HPV Vaccine  Aged Out   Colon Cancer Screening  Discontinued    Advanced directives: yes  Conditions/risks identified: low falls risk  Next appointment: Follow up in one year for your annual wellness visit 10/22/2023 @ 2:30pm telephone   Preventive Care 65 Years and Older, Female Preventive care refers to lifestyle choices and visits with your health care provider that can promote health and wellness. What does preventive care include? A yearly physical exam. This is also called an annual well check. Dental exams once or twice a year. Routine eye exams. Ask your health care provider how often you should have your eyes checked. Personal lifestyle choices, including: Daily care of your teeth and gums. Regular physical activity. Eating a healthy diet. Avoiding tobacco and drug use. Limiting alcohol use. Practicing safe sex. Taking low-dose aspirin every day. Taking vitamin and mineral supplements as recommended by your health care provider. What happens during an annual well check? The services and screenings done by your health care provider during your  annual well check will depend on your age, overall health, lifestyle risk factors, and family history of disease. Counseling  Your health care provider may ask you questions about your: Alcohol use. Tobacco use. Drug use. Emotional well-being. Home and relationship well-being. Sexual activity. Eating habits. History of falls. Memory and ability to understand (cognition). Work and work Astronomer. Reproductive health. Screening  You may have the following tests or measurements: Height, weight, and BMI. Blood pressure. Lipid and cholesterol levels. These may be checked every 5 years, or more frequently if you are over 79 years old. Skin check. Lung cancer screening. You may have this screening every year starting at age 79 if you have a 30-pack-year history of smoking and currently smoke or have quit within the past 15 years. Fecal occult blood test (FOBT) of the stool. You may have this test every year starting at age 79. Flexible sigmoidoscopy or colonoscopy. You may have a sigmoidoscopy every 5 years or a colonoscopy every 10 years starting at age 79. Hepatitis C blood test. Hepatitis B blood test. Sexually transmitted disease (STD) testing. Diabetes screening. This is done by checking your blood sugar (glucose) after you have not eaten for a while (fasting). You may have this done every 1-3 years. Bone density scan. This is done to screen for osteoporosis. You may have this done starting at age 79. Mammogram. This may be done every 1-2 years. Talk to your health care provider about how often you  should have regular mammograms. Talk with your health care provider about your test results, treatment options, and if necessary, the need for more tests. Vaccines  Your health care provider may recommend certain vaccines, such as: Influenza vaccine. This is recommended every year. Tetanus, diphtheria, and acellular pertussis (Tdap, Td) vaccine. You may need a Td booster every 10  years. Zoster vaccine. You may need this after age 79. Pneumococcal 13-valent conjugate (PCV13) vaccine. One dose is recommended after age 79. Pneumococcal polysaccharide (PPSV23) vaccine. One dose is recommended after age 77. Talk to your health care provider about which screenings and vaccines you need and how often you need them. This information is not intended to replace advice given to you by your health care provider. Make sure you discuss any questions you have with your health care provider. Document Released: 06/04/2015 Document Revised: 01/26/2016 Document Reviewed: 03/09/2015 Elsevier Interactive Patient Education  2017 [employer].  Fall Prevention in the Home Falls can cause injuries. They can happen to people of all ages. There are many things you can do to make your home safe and to help prevent falls. What can I do on the outside of my home? Regularly fix the edges of walkways and driveways and fix any cracks. Remove anything that might make you trip as you walk through a door, such as a raised step or threshold. Trim any bushes or trees on the path to your home. Use bright outdoor lighting. Clear any walking paths of anything that might make someone trip, such as rocks or tools. Regularly check to see if handrails are loose or broken. Make sure that both sides of any steps have handrails. Any raised decks and porches should have guardrails on the edges. Have any leaves, snow, or ice cleared regularly. Use sand or salt on walking paths during winter. Clean up any spills in your garage right away. This includes oil or grease spills. What can I do in the bathroom? Use night lights. Install grab bars by the toilet and in the tub and shower. Do not use towel bars as grab bars. Use non-skid mats or decals in the tub or shower. If you need to sit down in the shower, use a plastic, non-slip stool. Keep the floor dry. Clean up any water that spills on the floor as soon as it  happens. Remove soap buildup in the tub or shower regularly. Attach bath mats securely with double-sided non-slip rug tape. Do not have throw rugs and other things on the floor that can make you trip. What can I do in the bedroom? Use night lights. Make sure that you have a light by your bed that is easy to reach. Do not use any sheets or blankets that are too big for your bed. They should not hang down onto the floor. Have a firm chair that has side arms. You can use this for support while you get dressed. Do not have throw rugs and other things on the floor that can make you trip. What can I do in the kitchen? Clean up any spills right away. Avoid walking on wet floors. Keep items that you use a lot in easy-to-reach places. If you need to reach something above you, use a strong step stool that has a grab bar. Keep electrical cords out of the way. Do not use floor polish or wax that makes floors slippery. If you must use wax, use non-skid floor wax. Do not have throw rugs and other things on the  floor that can make you trip. What can I do with my stairs? Do not leave any items on the stairs. Make sure that there are handrails on both sides of the stairs and use them. Fix handrails that are broken or loose. Make sure that handrails are as long as the stairways. Check any carpeting to make sure that it is firmly attached to the stairs. Fix any carpet that is loose or worn. Avoid having throw rugs at the top or bottom of the stairs. If you do have throw rugs, attach them to the floor with carpet tape. Make sure that you have a light switch at the top of the stairs and the bottom of the stairs. If you do not have them, ask someone to add them for you. What else can I do to help prevent falls? Wear shoes that: Do not have high heels. Have rubber bottoms. Are comfortable and fit you well. Are closed at the toe. Do not wear sandals. If you use a stepladder: Make sure that it is fully opened.  Do not climb a closed stepladder. Make sure that both sides of the stepladder are locked into place. Ask someone to hold it for you, if possible. Clearly mark and make sure that you can see: Any grab bars or handrails. First and last steps. Where the edge of each step is. Use tools that help you move around (mobility aids) if they are needed. These include: Canes. Walkers. Scooters. Crutches. Turn on the lights when you go into a dark area. Replace any light bulbs as soon as they burn out. Set up your furniture so you have a clear path. Avoid moving your furniture around. If any of your floors are uneven, fix them. If there are any pets around you, be aware of where they are. Review your medicines with your doctor. Some medicines can make you feel dizzy. This can increase your chance of falling. Ask your doctor what other things that you can do to help prevent falls. This information is not intended to replace advice given to you by your health care provider. Make sure you discuss any questions you have with your health care provider. Document Released: 03/04/2009 Document Revised: 10/14/2015 Document Reviewed: 06/12/2014 Elsevier Interactive Patient Education  2017 [employer].

## 2022-10-17 NOTE — Progress Notes (Signed)
I connected with  Fabienne Bruns on 10/17/22 by a audio enabled telemedicine application and verified that I am speaking with the correct person using two identifiers.  Patient Location: Home  Provider Location: Office/Clinic  I discussed the limitations of evaluation and management by telemedicine. The patient expressed understanding and agreed to proceed.  Subjective:   Dawn Bruce is a 77 y.o. female who presents for Medicare Annual (Subsequent) preventive examination.  Review of Systems     Cardiac Risk Factors include: advanced age (>24men, >35 women);dyslipidemia;hypertension     Objective:    Today's Vitals   10/17/22 1438  Weight: 127 lb (57.6 kg)  Height: 5\' 3"  (1.6 m)   Body mass index is 22.5 kg/m.     10/17/2022    2:44 PM 04/10/2022   11:08 AM 01/20/2022    9:38 AM 08/31/2021    8:37 PM 08/31/2021    8:36 PM 08/31/2021    7:18 AM 04/20/2020   10:08 AM  Advanced Directives  Does Patient Have a Medical Advance Directive? Yes Yes Yes  No No Yes  Type of Special educational needs teacher of [place];Living will Living will  Living will;Healthcare Power of [employer] Power of [place];Living will  Copy of Healthcare Power of Attorney in Chart?     No - copy requested  Yes - validated most recent copy scanned in chart (See row information)  Would patient like information on creating a medical advance directive?    Yes (Inpatient - patient requests chaplain consult to create a medical advance directive) No - Patient declined      Current Medications (verified) Outpatient Encounter Medications as of 10/17/2022  Medication Sig   amLODipine (NORVASC) 2.5 MG tablet Take 2.5 mg by mouth daily.   apixaban (ELIQUIS) 2.5 MG TABS tablet Take 2.5 mg by mouth 2 (two) times daily.   atorvastatin (LIPITOR) 80 MG tablet Take 80 mg by mouth daily.   carvedilol (COREG) 6.25 MG tablet Take 1 tablet (6.25 mg total) by mouth 2 (two) times daily.   clopidogrel (PLAVIX) 75 MG  tablet Take 75 mg by mouth daily.   ezetimibe (ZETIA) 10 MG tablet TAKE 1 TABLET(10 MG) BY MOUTH DAILY   famotidine (PEPCID) 40 MG tablet Take 1 tablet (40 mg total) by mouth daily.   fentaNYL (DURAGESIC) 25 MCG/HR Place 2 patches onto the skin every 3 (three) days.   pregabalin (LYRICA) 150 MG capsule Take 1 capsule (150 mg total) by mouth 2 (two) times daily.   traZODone (DESYREL) 50 MG tablet Take 0.5-1 tablets (25-50 mg total) by mouth at bedtime as needed for sleep.   valsartan (DIOVAN) 160 MG tablet Take 160 mg by mouth in the morning and at bedtime.   No facility-administered encounter medications on file as of 10/17/2022.    Allergies (verified) Patient has no known allergies.   History: Past Medical History:  Diagnosis Date   Actinic keratosis    Chronic pain    GERD (gastroesophageal reflux disease) ?   Taking Pepcid   History of basal cell carcinoma (BCC) 05/02/2021   right upper back paraspinal   History of SCC (squamous cell carcinoma) of skin 09/10/2019   left dorsum wrist ED&C done 10/28/19   History of SCC (squamous cell carcinoma) of skin 03/04/2020   right dorsum hand   History of squamous cell carcinoma in situ (SCCIS) 03/04/2020   left dorsum wrist  ED&C   History of squamous cell carcinoma in situ (SCCIS) 03/04/2020  right medial infraorbital  ED&C 04/28/2020   History of squamous cell carcinoma in situ (SCCIS) 10/28/2019   right dorsum hand proximal lateral   History of squamous cell carcinoma in situ (SCCIS) 10/28/2019   right dorsum hand proximal medial   Hyperlipidemia    Hypertension    Renal disorder    Squamous cell carcinoma in situ 10/31/2021   left distal  tricep, EDC   Squamous cell carcinoma of skin 08/28/2022   Right volar forearm - EDC   Past Surgical History:  Procedure Laterality Date   ABDOMINAL HYSTERECTOMY     APPENDECTOMY  April 2023   Burst appendix   AUGMENTATION MAMMAPLASTY Bilateral    25-30 years ago   CARDIOVERSION N/A  12/01/2020   Procedure: CARDIOVERSION;  Surgeon: Debbe Odea, MD;  Location: [facility] ORS;  Service: Cardiovascular;  Laterality: N/A;   CAROTID ARTERY ANGIOPLASTY Right 1995(approximate)   CAROTID ENDARTERECTOMY  2001   COSMETIC SURGERY     ELBOW SURGERY Left    FRACTURE SURGERY  2016   Arm   THORACENTESIS N/A 12/27/2020   Procedure: THORACENTESIS;  Surgeon: Josephine Igo, DO;  Location: [facility];  Service: Pulmonary;  Laterality: N/A;   TONSILLECTOMY     TUBAL LIGATION  ?   Family History  Problem Relation Age of Onset   Cancer Mother    Cancer Father    Cancer Sister    Breast cancer Neg Hx    Social History   Socioeconomic History   Marital status: Married    Spouse name: Not on file   Number of children: 3   Years of education: Not on file   Highest education level: Some college, no degree  Occupational History   Occupation: retired  Tobacco Use   Smoking status: Former    Packs/day: 0.25    Years: 15.00    Additional pack years: 0.00    Total pack years: 3.75    Types: Cigarettes    Quit date: 1990    Years since quitting: 34.4    Passive exposure: Past   Smokeless tobacco: Never   Tobacco comments:    quit around 1990-1995 (?)  Vaping Use   Vaping Use: Never used  Substance and Sexual Activity   Alcohol use: Yes    Alcohol/week: 14.0 standard drinks of alcohol    Types: 14 Standard drinks or equivalent per week    Comment: 2 scotch shots   Drug use: Never   Sexual activity: Not Currently    Birth control/protection: None  Other Topics Concern   Not on file  Social History Narrative   Not on file   Social Determinants of Health   Financial Resource Strain: Low Risk  (10/13/2022)   Overall Financial Resource Strain (CARDIA)    Difficulty of Paying Living Expenses: Not hard at all  Food Insecurity: No Food Insecurity (10/13/2022)   Hunger Vital Sign    Worried About Running Out of Food in the Last Year: Never true    Ran Out of Food in the  Last Year: Never true  Transportation Needs: No Transportation Needs (10/13/2022)   PRAPARE - Administrator, Civil Service (Medical): No    Lack of Transportation (Non-Medical): No  Physical Activity: Insufficiently Active (10/13/2022)   Exercise Vital Sign    Days of Exercise per Week: 5 days    Minutes of Exercise per Session: 20 min  Stress: No Stress Concern Present (10/13/2022)   [employer] of Occupational Health -  Occupational Stress Questionnaire    Feeling of Stress : Not at all  Social Connections: Unknown (10/13/2022)   Social Connection and Isolation Panel [NHANES]    Frequency of Communication with Friends and Family: More than three times a week    Frequency of Social Gatherings with Friends and Family: Once a week    Attends Religious Services: Not on Marketing executive or Organizations: No    Attends Banker Meetings: Never    Marital Status: Married    Tobacco Counseling Counseling given: Not Answered Tobacco comments: quit around [phone]   Clinical Intake:    Pain : No/denies pain   BMI - recorded: 22.5 Nutritional Status: BMI of 19-24  Normal Nutritional Risks: None Diabetes: No  How often do you need to have someone help you when you read instructions, pamphlets, or other written materials from your doctor or pharmacy?: 1 - Never  Diabetic?no  Interpreter Needed?: No  Comments: lives with husband Information entered by :: B.Josua Ferrebee,LPN   Activities of Daily Living    10/13/2022    6:43 PM 06/01/2022    1:30 PM  In your present state of health, do you have any difficulty performing the following activities:  Hearing? 0 0  Vision? 0 0  Difficulty concentrating or making decisions? 0 0  Walking or climbing stairs? 0 0  Dressing or bathing? 0 0  Doing errands, shopping? 0 0  Preparing Food and eating ? N   Using the Toilet? N   In the past six months, have you accidently leaked urine? Y   Do you  have problems with loss of bowel control? N   Managing your Medications? N   Managing your Finances? N   Housekeeping or managing your Housekeeping? N     Patient Care Team: Erasmo Downer, MD as PCP - General (Family Medicine) Debbe Odea, MD as PCP - Cardiology (Cardiology) Deirdre Evener, MD (Dermatology) Annie Sable, MD as Consulting Physician (Nephrology) Irene Limbo., MD (Ophthalmology) Gaspar Cola, [facility] as Pharmacist (Pharmacist)  Indicate any recent Medical Services you may have received from other than [facility] providers in the past year (date may be approximate).     Assessment:   This is a routine wellness examination for [facility].  Hearing/Vision screen Hearing Screening - Comments:: Adequate hearing Vision Screening - Comments:: Adequate vision;readers only Dr Alvester Morin  Dietary issues and exercise activities discussed: Current Exercise Habits: Home exercise routine, Type of exercise: walking, Time (Minutes): 20, Frequency (Times/Week): 5, Weekly Exercise (Minutes/Week): 100, Intensity: Mild, Exercise limited by: neurologic condition(s);cardiac condition(s)   Goals Addressed             This Visit's Progress    DIET - INCREASE WATER INTAKE   On track    Recommend to drink at least 6-8 8oz glasses of water per day.       Depression Screen    10/17/2022    2:42 PM 06/05/2022    1:46 PM 06/01/2022    1:29 PM 04/24/2022    4:58 PM 12/12/2021    1:38 PM 09/27/2021   11:08 AM 07/04/2021   11:41 AM  PHQ 2/9 Scores  PHQ - 2 Score 0 0 0 0 0 0 0  PHQ- 9 Score  0 0 1 0 0 0    Fall Risk    10/13/2022    6:43 PM 06/01/2022    1:29 PM 04/10/2022   11:09 AM 12/12/2021  1:37 PM 11/29/2021   10:07 AM  Fall Risk   Falls in the past year? 0 0 0 1 0  Number falls in past yr: 0 0  0   Injury with Fall? 0 0  0   Risk for fall due to :  No Fall Risks Medication side effect No Fall Risks   Follow up  Falls evaluation completed Education  provided;Falls prevention discussed      FALL RISK PREVENTION PERTAINING TO THE HOME:  Any stairs in or around the home? No  If so, are there any without handrails? No  Home free of loose throw rugs in walkways, pet beds, electrical cords, etc? Yes  Adequate lighting in your home to reduce risk of falls? Yes   ASSISTIVE DEVICES UTILIZED TO PREVENT FALLS:  Life alert? No  Use of a cane, walker or w/c? No  Grab bars in the bathroom? No  Shower chair or bench in shower? No  Elevated toilet seat or a handicapped toilet? No    Cognitive Function:        10/17/2022    2:45 PM 10/10/2021    1:56 PM 04/20/2020   10:11 AM 04/08/2019    1:47 PM  6CIT Screen  What Year? 0 points 0 points 0 points 0 points  What month? 0 points 0 points 0 points 0 points  What time? 0 points 0 points 0 points 0 points  Count back from 20 0 points 0 points 0 points 0 points  Months in reverse 0 points 0 points 0 points 0 points  Repeat phrase 0 points 0 points 0 points 0 points  Total Score 0 points 0 points 0 points 0 points    Immunizations Immunization History  Administered Date(s) Administered   Fluad Quad(high Dose 65+) 04/09/2019, 03/09/2022   Hepatitis A 02/07/2011, 08/07/2011   Influenza Split 04/16/2012, 03/06/2013   Influenza, High Dose Seasonal PF 02/27/2014, 03/11/2015, 04/18/2016, 05/28/2017   Influenza-Unspecified 03/20/2020, 02/24/2021   PFIZER(Purple Top)SARS-COV-2 Vaccination 06/02/2019, 06/23/2019   Pneumococcal Conjugate-13 02/05/2014   Pneumococcal Polysaccharide-23 12/16/2009, 04/18/2016   Tdap 09/28/2007   Varicella 09/27/2009, 11/01/2017   Zoster Recombinat (Shingrix) 11/01/2017    TDAP status: Up to date  Flu Vaccine status: Up to date  Pneumococcal vaccine status: Up to date  Covid-19 vaccine status: Completed vaccines  Qualifies for Shingles Vaccine? Yes   Zostavax completed Yes   Shingrix Completed?: Yes  Screening Tests Health Maintenance  Topic Date  Due   DTaP/Tdap/Td (2 - Td or Tdap) 09/27/2017   Zoster Vaccines- Shingrix (2 of 2) 12/27/2017   COVID-19 Vaccine (3 - Pfizer risk series) 07/21/2019   INFLUENZA VACCINE  12/21/2022   Medicare Annual Wellness (AWV)  10/17/2023   Pneumonia Vaccine 24+ Years old  Completed   DEXA SCAN  Completed   Hepatitis C Screening  Completed   HPV VACCINES  Aged Out   Colonoscopy  Discontinued    Health Maintenance  Health Maintenance Due  Topic Date Due   DTaP/Tdap/Td (2 - Td or Tdap) 09/27/2017   Zoster Vaccines- Shingrix (2 of 2) 12/27/2017   COVID-19 Vaccine (3 - Pfizer risk series) 07/21/2019    Colorectal cancer screening: No longer required.   Mammogram status: No longer required due to age.  Bone Density status: Completed yes. Results reflect: Bone density results: NORMAL. Repeat every 5 years.  Lung Cancer Screening: (Low Dose CT Chest recommended if Age 21-80 years, 30 pack-year currently smoking OR have quit w/in 15years.) does  not qualify.   Lung Cancer Screening Referral: no  Additional Screening:  Hepatitis C Screening: does not qualify; Completed yes  Vision Screening: Recommended annual ophthalmology exams for early detection of glaucoma and other disorders of the eye. Is the patient up to date with their annual eye exam?  Yes  Who is the provider or what is the name of the office in which the patient attends annual eye exams? Dr Alvester Morin If pt is not established with a provider, would they like to be referred to a provider to establish care? No .   Dental Screening: Recommended annual dental exams for proper oral hygiene  Community Resource Referral / Chronic Care Management: CRR required this visit?  No   CCM required this visit?  No    Plan:    I have personally reviewed and noted the following in the patient's chart:   Medical and social history Use of alcohol, tobacco or illicit drugs  Current medications and supplements including opioid prescriptions. Patient  is currently taking opioid prescriptions. Information provided to patient regarding non-opioid alternatives. Patient advised to discuss non-opioid treatment plan with their provider. Functional ability and status Nutritional status Physical activity Advanced directives List of other physicians Hospitalizations, surgeries, and ER visits in previous 12 months Vitals Screenings to include cognitive, depression, and falls Referrals and appointments  In addition, I have reviewed and discussed with patient certain preventive protocols, quality metrics, and best practice recommendations. A written personalized care plan for preventive services as well as general preventive health recommendations were provided to patient.   Sue Lush, LPN   08/28/8117   Nurse Notes: pt states she is doing alright. She does inquire about her Duragesic patches that she is having trouble filling. She says the pharmacy says it is a insurance issue. I encouraged her to call her BCBS and inquire the problem. She relays she will do so.

## 2022-10-25 ENCOUNTER — Telehealth: Payer: Self-pay | Admitting: Family Medicine

## 2022-11-29 DIAGNOSIS — K080 Exfoliation of teeth due to systemic causes: Secondary | ICD-10-CM | POA: Diagnosis not present

## 2022-12-04 DIAGNOSIS — K080 Exfoliation of teeth due to systemic causes: Secondary | ICD-10-CM | POA: Diagnosis not present

## 2022-12-05 DIAGNOSIS — I25118 Atherosclerotic heart disease of native coronary artery with other forms of angina pectoris: Secondary | ICD-10-CM | POA: Diagnosis not present

## 2022-12-05 DIAGNOSIS — Z7901 Long term (current) use of anticoagulants: Secondary | ICD-10-CM | POA: Diagnosis not present

## 2022-12-05 DIAGNOSIS — I4819 Other persistent atrial fibrillation: Secondary | ICD-10-CM | POA: Diagnosis not present

## 2022-12-05 DIAGNOSIS — I1A0 Resistant hypertension: Secondary | ICD-10-CM | POA: Diagnosis not present

## 2022-12-07 ENCOUNTER — Encounter: Payer: Self-pay | Admitting: Family Medicine

## 2022-12-07 ENCOUNTER — Encounter: Payer: Medicare Other | Admitting: Family Medicine

## 2022-12-08 ENCOUNTER — Encounter: Payer: Self-pay | Admitting: Family Medicine

## 2022-12-08 ENCOUNTER — Ambulatory Visit (INDEPENDENT_AMBULATORY_CARE_PROVIDER_SITE_OTHER): Payer: Medicare Other | Admitting: Family Medicine

## 2022-12-08 VITALS — BP 80/60 | HR 57 | Temp 97.9°F | Ht 63.0 in | Wt 128.0 lb

## 2022-12-08 DIAGNOSIS — N1832 Chronic kidney disease, stage 3b: Secondary | ICD-10-CM

## 2022-12-08 DIAGNOSIS — I10 Essential (primary) hypertension: Secondary | ICD-10-CM

## 2022-12-08 DIAGNOSIS — I5032 Chronic diastolic (congestive) heart failure: Secondary | ICD-10-CM

## 2022-12-08 DIAGNOSIS — I119 Hypertensive heart disease without heart failure: Secondary | ICD-10-CM | POA: Diagnosis not present

## 2022-12-08 DIAGNOSIS — M792 Neuralgia and neuritis, unspecified: Secondary | ICD-10-CM

## 2022-12-08 DIAGNOSIS — E782 Mixed hyperlipidemia: Secondary | ICD-10-CM

## 2022-12-08 DIAGNOSIS — Z0000 Encounter for general adult medical examination without abnormal findings: Secondary | ICD-10-CM

## 2022-12-08 DIAGNOSIS — E559 Vitamin D deficiency, unspecified: Secondary | ICD-10-CM

## 2022-12-08 DIAGNOSIS — D692 Other nonthrombocytopenic purpura: Secondary | ICD-10-CM

## 2022-12-08 DIAGNOSIS — R7303 Prediabetes: Secondary | ICD-10-CM

## 2022-12-08 MED ORDER — PREGABALIN 150 MG PO CAPS
150.0000 mg | ORAL_CAPSULE | Freq: Two times a day (BID) | ORAL | 1 refills | Status: DC
Start: 2022-12-08 — End: 2023-01-18

## 2022-12-08 MED ORDER — FENTANYL 25 MCG/HR TD PT72: 2.0000 | MEDICATED_PATCH | TRANSDERMAL | 0 refills | Status: DC

## 2022-12-08 NOTE — Assessment & Plan Note (Signed)
Recommend low carb diet Last A1c normal -reviewed

## 2022-12-08 NOTE — Assessment & Plan Note (Signed)
No current exacerbation Stable Followed by cardiology No changes to medications today

## 2022-12-08 NOTE — Assessment & Plan Note (Signed)
Continue statin Reviewed FLP and CMP Goal LDL < 70

## 2022-12-08 NOTE — Assessment & Plan Note (Signed)
F/b Cardiology 

## 2022-12-08 NOTE — Progress Notes (Signed)
Complete physical exam  Patient: Dawn Bruce   DOB: [birth year]   77 y.o. Female  MRN: [id number]  Subjective:    Chief Complaint  Patient presents with   Annual Exam    Dawn Bruce is a 77 y.o. female who presents today for a complete physical exam. She reports consuming a general diet.  She generally feels fairly well. She reports sleeping fairly well. She does not have additional problems to discuss today.   Discussed the use of AI scribe software for clinical note transcription with the patient, who gave verbal consent to proceed.  History of Present Illness   The patient, a 77 year old with a history of hypertension, presents for an annual physical. She reports that her blood pressure has been 'real low' at home. She denies any other concerns or symptoms. She recently returned from a trip to the [place] with her daughter.        Most recent fall risk assessment:    12/08/2022   11:31 AM  Fall Risk   Falls in the past year? 0  Number falls in past yr: 0  Injury with Fall? 0     Most recent depression screenings:    12/08/2022   11:31 AM 10/17/2022    2:42 PM  PHQ 2/9 Scores  PHQ - 2 Score 0 0  PHQ- 9 Score 1         Patient Care Team: Erasmo Downer, MD as PCP - General (Family Medicine) Debbe Odea, MD as PCP - Cardiology (Cardiology) Deirdre Evener, MD (Dermatology) Annie Sable, MD as Consulting Physician (Nephrology) Irene Limbo., MD (Ophthalmology) Gaspar Cola, [facility] (Inactive) as Pharmacist (Pharmacist)   Outpatient Medications Prior to Visit  Medication Sig   apixaban (ELIQUIS) 2.5 MG TABS tablet Take 2.5 mg by mouth 2 (two) times daily.   atorvastatin (LIPITOR) 80 MG tablet Take 80 mg by mouth daily.   carvedilol (COREG) 6.25 MG tablet Take 1 tablet (6.25 mg total) by mouth 2 (two) times daily.   clopidogrel (PLAVIX) 75 MG tablet Take 75 mg by mouth daily.   ezetimibe (ZETIA) 10 MG tablet TAKE 1 TABLET(10  MG) BY MOUTH DAILY   famotidine (PEPCID) 40 MG tablet Take 1 tablet (40 mg total) by mouth daily.   traZODone (DESYREL) 50 MG tablet Take 0.5-1 tablets (25-50 mg total) by mouth at bedtime as needed for sleep.   valsartan (DIOVAN) 160 MG tablet Take 160 mg by mouth in the morning and at bedtime.   [DISCONTINUED] amLODipine (NORVASC) 2.5 MG tablet Take 2.5 mg by mouth daily.   [DISCONTINUED] fentaNYL (DURAGESIC) 25 MCG/HR Place 2 patches onto the skin every 3 (three) days.   [DISCONTINUED] pregabalin (LYRICA) 150 MG capsule Take 1 capsule (150 mg total) by mouth 2 (two) times daily.   No facility-administered medications prior to visit.    ROS per HPI        Objective:     BP (!) 80/60 Comment: home reading  Pulse (!) 57   Temp 97.9 F (36.6 C) (Oral)   Ht 5\' 3"  (1.6 m)   Wt 128 lb (58.1 kg)   SpO2 100%   BMI 22.67 kg/m    Physical Exam Vitals reviewed.  Constitutional:      General: She is not in acute distress.    Appearance: Normal appearance. She is well-developed. She is not diaphoretic.  HENT:     Head: Normocephalic and atraumatic.     Right Ear:  Tympanic membrane, ear canal and external ear normal.     Left Ear: Tympanic membrane, ear canal and external ear normal.     Nose: Nose normal.     Mouth/Throat:     Mouth: Mucous membranes are moist.     Pharynx: Oropharynx is clear. No oropharyngeal exudate.  Eyes:     General: No scleral icterus.    Conjunctiva/sclera: Conjunctivae normal.     Pupils: Pupils are equal, round, and reactive to light.  Neck:     Thyroid: No thyromegaly.  Cardiovascular:     Rate and Rhythm: Normal rate and regular rhythm.     Heart sounds: Normal heart sounds.  Pulmonary:     Effort: Pulmonary effort is normal. No respiratory distress.     Breath sounds: Normal breath sounds. No wheezing or rales.  Abdominal:     General: There is no distension.     Palpations: Abdomen is soft.     Tenderness: There is no abdominal  tenderness.  Musculoskeletal:        General: No deformity.     Cervical back: Neck supple.     Right lower leg: No edema.     Left lower leg: No edema.  Lymphadenopathy:     Cervical: No cervical adenopathy.  Skin:    General: Skin is warm and dry.     Findings: No rash.  Neurological:     Mental Status: She is alert and oriented to person, place, and time. Mental status is at baseline.     Gait: Gait normal.  Psychiatric:        Mood and Affect: Mood normal.        Behavior: Behavior normal.        Thought Content: Thought content normal.      No results found for any visits on 12/08/22.     Assessment & Plan:    Routine Health Maintenance and Physical Exam  Immunization History  Administered Date(s) Administered   Fluad Quad(high Dose 65+) 04/09/2019, 03/09/2022   Hepatitis A 02/07/2011, 08/07/2011   Influenza Split 04/16/2012, 03/06/2013   Influenza, High Dose Seasonal PF 02/27/2014, 03/11/2015, 04/18/2016, 05/28/2017   Influenza-Unspecified 03/20/2020, 02/24/2021   PFIZER(Purple Top)SARS-COV-2 Vaccination 06/02/2019, 06/23/2019   Pneumococcal Conjugate-13 02/05/2014   Pneumococcal Polysaccharide-23 12/16/2009, 04/18/2016   Tdap 09/28/2007   Varicella 09/27/2009, 11/01/2017   Zoster Recombinant(Shingrix) 11/01/2017    Health Maintenance  Topic Date Due   DTaP/Tdap/Td (2 - Td or Tdap) 09/27/2017   Zoster Vaccines- Shingrix (2 of 2) 12/27/2017   COVID-19 Vaccine (3 - Pfizer risk series) 07/21/2019   INFLUENZA VACCINE  12/21/2022   Medicare Annual Wellness (AWV)  10/17/2023   Pneumonia Vaccine 49+ Years old  Completed   DEXA SCAN  Completed   Hepatitis C Screening  Completed   HPV VACCINES  Aged Out   Colonoscopy  Discontinued    Discussed health benefits of physical activity, and encouraged her to engage in regular exercise appropriate for her age and condition.  Problem List Items Addressed This Visit       Cardiovascular and Mediastinum   Essential  hypertension   Senile purpura (HCC)    Worsening now that she is on antiplatelet and anticoag Continue to monitor      Chronic diastolic heart failure (HCC)    No current exacerbation Stable Followed by cardiology No changes to medications today      Hypertensive heart disease without congestive heart failure    F/b Cardiology  Genitourinary   CKD (chronic kidney disease) stage 3, GFR 30-59 ml/min (HCC)    Chronic and stable Reviewed recent labs        Other   Neurogenic pain (Chronic)    Chronic and stable 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 -meds are being adjusted by Cardiology - BP in office is higher today - discussed hypotension precautions  General Health Maintenance: Annual physical examination. -Complete routine physical examination and discuss any relevant findings. -Review and update immunizations, screenings, and preventive measures as per guidelines for her age and medical history.         Return in about 6 months (around 06/10/2023) for chronic disease f/u.     Shirlee Latch, MD

## 2022-12-08 NOTE — Assessment & Plan Note (Signed)
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

## 2022-12-08 NOTE — Assessment & Plan Note (Signed)
Chronic and stable Reviewed recent labs

## 2022-12-08 NOTE — Assessment & Plan Note (Signed)
Worsening now that she is on antiplatelet and anticoag Continue to monitor

## 2022-12-12 ENCOUNTER — Encounter: Payer: Self-pay | Admitting: Family Medicine

## 2022-12-20 DIAGNOSIS — I2510 Atherosclerotic heart disease of native coronary artery without angina pectoris: Secondary | ICD-10-CM | POA: Diagnosis not present

## 2022-12-20 DIAGNOSIS — I119 Hypertensive heart disease without heart failure: Secondary | ICD-10-CM | POA: Diagnosis not present

## 2022-12-25 ENCOUNTER — Other Ambulatory Visit: Payer: Self-pay | Admitting: Student

## 2022-12-25 DIAGNOSIS — E785 Hyperlipidemia, unspecified: Secondary | ICD-10-CM | POA: Diagnosis not present

## 2022-12-25 DIAGNOSIS — J90 Pleural effusion, not elsewhere classified: Secondary | ICD-10-CM | POA: Diagnosis not present

## 2022-12-25 DIAGNOSIS — Z87891 Personal history of nicotine dependence: Secondary | ICD-10-CM | POA: Diagnosis not present

## 2022-12-25 DIAGNOSIS — Z85828 Personal history of other malignant neoplasm of skin: Secondary | ICD-10-CM | POA: Diagnosis not present

## 2022-12-25 DIAGNOSIS — J9809 Other diseases of bronchus, not elsewhere classified: Secondary | ICD-10-CM | POA: Diagnosis not present

## 2022-12-25 DIAGNOSIS — I2722 Pulmonary hypertension due to left heart disease: Secondary | ICD-10-CM | POA: Diagnosis not present

## 2022-12-25 DIAGNOSIS — N1831 Chronic kidney disease, stage 3a: Secondary | ICD-10-CM | POA: Diagnosis not present

## 2022-12-25 DIAGNOSIS — I5032 Chronic diastolic (congestive) heart failure: Secondary | ICD-10-CM | POA: Diagnosis not present

## 2022-12-25 DIAGNOSIS — I2510 Atherosclerotic heart disease of native coronary artery without angina pectoris: Secondary | ICD-10-CM | POA: Diagnosis not present

## 2022-12-25 DIAGNOSIS — I4819 Other persistent atrial fibrillation: Secondary | ICD-10-CM | POA: Diagnosis not present

## 2022-12-25 DIAGNOSIS — Z7901 Long term (current) use of anticoagulants: Secondary | ICD-10-CM | POA: Diagnosis not present

## 2022-12-25 DIAGNOSIS — R918 Other nonspecific abnormal finding of lung field: Secondary | ICD-10-CM | POA: Diagnosis not present

## 2022-12-25 DIAGNOSIS — Z79899 Other long term (current) drug therapy: Secondary | ICD-10-CM | POA: Diagnosis not present

## 2022-12-25 DIAGNOSIS — I130 Hypertensive heart and chronic kidney disease with heart failure and stage 1 through stage 4 chronic kidney disease, or unspecified chronic kidney disease: Secondary | ICD-10-CM | POA: Diagnosis not present

## 2022-12-29 ENCOUNTER — Ambulatory Visit: Payer: Medicare Other

## 2023-01-08 ENCOUNTER — Ambulatory Visit: Payer: Medicare Other

## 2023-01-17 ENCOUNTER — Other Ambulatory Visit: Payer: Self-pay | Admitting: Family Medicine

## 2023-01-17 DIAGNOSIS — M792 Neuralgia and neuritis, unspecified: Secondary | ICD-10-CM

## 2023-01-17 NOTE — Telephone Encounter (Signed)
[facility] pharmacy is requesting prior authorization pregabalin (LYRICA) 150 MG capsule   Please advise

## 2023-01-19 MED ORDER — PREGABALIN 150 MG PO CAPS
150.0000 mg | ORAL_CAPSULE | Freq: Two times a day (BID) | ORAL | 1 refills | Status: DC
Start: 2023-01-19 — End: 2023-09-03

## 2023-01-24 NOTE — Telephone Encounter (Addendum)
Patient called and stated her medication needs to be approved (prior auth) before her pharmacy will fill this. Patient stated she received a message from her pharmacy about this and that she has been out of this medication and was checking on this. Please advise and update patient on status of prior authorization.   Patient Callback # [phone]     pregabalin (LYRICA) 150 MG capsule    [id number] - [place], [place] - [address] AT [facility] [address]  Phone:[phone] Fax: [phone]

## 2023-02-02 ENCOUNTER — Ambulatory Visit: Payer: Medicare Other

## 2023-02-03 ENCOUNTER — Encounter: Payer: Self-pay | Admitting: Family Medicine

## 2023-02-05 MED ORDER — FENTANYL 25 MCG/HR TD PT72: 2.0000 | MEDICATED_PATCH | TRANSDERMAL | 0 refills | Status: DC

## 2023-02-07 ENCOUNTER — Telehealth: Payer: Self-pay | Admitting: Family Medicine

## 2023-02-07 NOTE — Telephone Encounter (Signed)
Medication Refill - Medication: fentaNYL (DURAGESIC) 25 MCG/HR    Has the patient contacted their pharmacy? Yes, pt went there and was told this med was sent to [place] pharmacy   (Agent: If yes, when and what did the pharmacy advise?)contact pcp  Preferred Pharmacy (with phone number or street name):  [id number] - [place], [place] - [address] AT [facility] [address] Phone: [phone]  Fax: [phone]     Has the patient been seen for an appointment in the last year OR does the patient have an upcoming appointment? yes  Agent: Please be advised that RX refills may take up to 3 business days. We ask that you follow-up with your pharmacy.

## 2023-02-08 MED ORDER — FENTANYL 25 MCG/HR TD PT72: 2.0000 | MEDICATED_PATCH | TRANSDERMAL | 0 refills | Status: DC

## 2023-02-21 DIAGNOSIS — J90 Pleural effusion, not elsewhere classified: Secondary | ICD-10-CM | POA: Diagnosis not present

## 2023-02-21 DIAGNOSIS — R918 Other nonspecific abnormal finding of lung field: Secondary | ICD-10-CM | POA: Diagnosis not present

## 2023-02-27 ENCOUNTER — Ambulatory Visit: Payer: Medicare Other | Admitting: Dermatology

## 2023-03-22 DIAGNOSIS — N1831 Chronic kidney disease, stage 3a: Secondary | ICD-10-CM | POA: Diagnosis not present

## 2023-03-30 ENCOUNTER — Encounter: Payer: Self-pay | Admitting: Family Medicine

## 2023-03-30 ENCOUNTER — Other Ambulatory Visit: Payer: Self-pay | Admitting: Family Medicine

## 2023-03-30 DIAGNOSIS — M792 Neuralgia and neuritis, unspecified: Secondary | ICD-10-CM

## 2023-04-02 ENCOUNTER — Encounter: Payer: Self-pay | Admitting: Family Medicine

## 2023-04-02 MED ORDER — CLOPIDOGREL BISULFATE 75 MG PO TABS: 75.0000 mg | ORAL_TABLET | Freq: Every day | ORAL | 1 refills | Status: AC

## 2023-04-03 ENCOUNTER — Other Ambulatory Visit: Payer: Self-pay

## 2023-04-04 ENCOUNTER — Other Ambulatory Visit: Payer: Self-pay | Admitting: Family Medicine

## 2023-04-04 DIAGNOSIS — E782 Mixed hyperlipidemia: Secondary | ICD-10-CM

## 2023-04-04 MED ORDER — FENTANYL 25 MCG/HR TD PT72: 2.0000 | MEDICATED_PATCH | TRANSDERMAL | 0 refills | Status: DC

## 2023-04-04 NOTE — Addendum Note (Signed)
Addended by: Debera Lat on: 04/04/2023 12:09 AM   Modules accepted: Orders

## 2023-04-05 NOTE — Telephone Encounter (Signed)
Requested Prescriptions  Pending Prescriptions Disp Refills   ezetimibe (ZETIA) 10 MG tablet [Pharmacy Med Name: EZETIMIBE 10MG  TABLETS] 90 tablet 0    Sig: TAKE 1 TABLET(10 MG) BY MOUTH DAILY     There is no refill protocol information for this order

## 2023-04-12 ENCOUNTER — Other Ambulatory Visit: Payer: Self-pay

## 2023-04-12 ENCOUNTER — Emergency Department: Payer: Medicare Other

## 2023-04-12 ENCOUNTER — Inpatient Hospital Stay: Payer: Medicare Other

## 2023-04-12 ENCOUNTER — Inpatient Hospital Stay
Admission: EM | Admit: 2023-04-12 | Discharge: 2023-04-20 | DRG: 321 | Disposition: A | Discharge status: Home Health | Payer: Medicare Other | Attending: Internal Medicine | Admitting: Internal Medicine

## 2023-04-12 ENCOUNTER — Ambulatory Visit: Payer: Self-pay | Admitting: *Deleted

## 2023-04-12 DIAGNOSIS — K7200 Acute and subacute hepatic failure without coma: Secondary | ICD-10-CM | POA: Diagnosis not present

## 2023-04-12 DIAGNOSIS — Z4682 Encounter for fitting and adjustment of non-vascular catheter: Secondary | ICD-10-CM | POA: Diagnosis not present

## 2023-04-12 DIAGNOSIS — I739 Peripheral vascular disease, unspecified: Secondary | ICD-10-CM | POA: Diagnosis not present

## 2023-04-12 DIAGNOSIS — Y831 Surgical operation with implant of artificial internal device as the cause of abnormal reaction of the patient, or of later complication, without mention of misadventure at the time of the procedure: Secondary | ICD-10-CM | POA: Diagnosis present

## 2023-04-12 DIAGNOSIS — I200 Unstable angina: Secondary | ICD-10-CM | POA: Diagnosis not present

## 2023-04-12 DIAGNOSIS — I161 Hypertensive emergency: Principal | ICD-10-CM | POA: Diagnosis present

## 2023-04-12 DIAGNOSIS — R072 Precordial pain: Secondary | ICD-10-CM | POA: Diagnosis not present

## 2023-04-12 DIAGNOSIS — Z9889 Other specified postprocedural states: Secondary | ICD-10-CM | POA: Diagnosis not present

## 2023-04-12 DIAGNOSIS — K683 Retroperitoneal hematoma: Secondary | ICD-10-CM | POA: Diagnosis not present

## 2023-04-12 DIAGNOSIS — I214 Non-ST elevation (NSTEMI) myocardial infarction: Secondary | ICD-10-CM | POA: Diagnosis not present

## 2023-04-12 DIAGNOSIS — I255 Ischemic cardiomyopathy: Secondary | ICD-10-CM | POA: Diagnosis present

## 2023-04-12 DIAGNOSIS — I5032 Chronic diastolic (congestive) heart failure: Secondary | ICD-10-CM | POA: Diagnosis not present

## 2023-04-12 DIAGNOSIS — G2581 Restless legs syndrome: Secondary | ICD-10-CM | POA: Diagnosis present

## 2023-04-12 DIAGNOSIS — J90 Pleural effusion, not elsewhere classified: Secondary | ICD-10-CM | POA: Diagnosis not present

## 2023-04-12 DIAGNOSIS — Z79899 Other long term (current) drug therapy: Secondary | ICD-10-CM

## 2023-04-12 DIAGNOSIS — R079 Chest pain, unspecified: Secondary | ICD-10-CM | POA: Diagnosis present

## 2023-04-12 DIAGNOSIS — Z720 Tobacco use: Secondary | ICD-10-CM | POA: Diagnosis present

## 2023-04-12 DIAGNOSIS — M7989 Other specified soft tissue disorders: Secondary | ICD-10-CM | POA: Diagnosis not present

## 2023-04-12 DIAGNOSIS — R7303 Prediabetes: Secondary | ICD-10-CM | POA: Diagnosis present

## 2023-04-12 DIAGNOSIS — F17210 Nicotine dependence, cigarettes, uncomplicated: Secondary | ICD-10-CM | POA: Diagnosis present

## 2023-04-12 DIAGNOSIS — F1020 Alcohol dependence, uncomplicated: Secondary | ICD-10-CM | POA: Diagnosis present

## 2023-04-12 DIAGNOSIS — N1831 Chronic kidney disease, stage 3a: Secondary | ICD-10-CM | POA: Diagnosis not present

## 2023-04-12 DIAGNOSIS — R0789 Other chest pain: Secondary | ICD-10-CM | POA: Diagnosis not present

## 2023-04-12 DIAGNOSIS — I5023 Acute on chronic systolic (congestive) heart failure: Secondary | ICD-10-CM | POA: Diagnosis not present

## 2023-04-12 DIAGNOSIS — I97630 Postprocedural hematoma of a circulatory system organ or structure following a cardiac catheterization: Secondary | ICD-10-CM | POA: Diagnosis not present

## 2023-04-12 DIAGNOSIS — I9751 Accidental puncture and laceration of a circulatory system organ or structure during a circulatory system procedure: Secondary | ICD-10-CM | POA: Diagnosis not present

## 2023-04-12 DIAGNOSIS — D696 Thrombocytopenia, unspecified: Secondary | ICD-10-CM | POA: Diagnosis not present

## 2023-04-12 DIAGNOSIS — Q600 Renal agenesis, unilateral: Secondary | ICD-10-CM | POA: Diagnosis not present

## 2023-04-12 DIAGNOSIS — I10 Essential (primary) hypertension: Secondary | ICD-10-CM | POA: Diagnosis not present

## 2023-04-12 DIAGNOSIS — I2510 Atherosclerotic heart disease of native coronary artery without angina pectoris: Secondary | ICD-10-CM | POA: Diagnosis present

## 2023-04-12 DIAGNOSIS — K661 Hemoperitoneum: Secondary | ICD-10-CM | POA: Diagnosis not present

## 2023-04-12 DIAGNOSIS — T82855A Stenosis of coronary artery stent, initial encounter: Secondary | ICD-10-CM | POA: Diagnosis present

## 2023-04-12 DIAGNOSIS — R82998 Other abnormal findings in urine: Secondary | ICD-10-CM | POA: Diagnosis not present

## 2023-04-12 DIAGNOSIS — E872 Acidosis, unspecified: Secondary | ICD-10-CM | POA: Diagnosis not present

## 2023-04-12 DIAGNOSIS — N3281 Overactive bladder: Secondary | ICD-10-CM | POA: Diagnosis not present

## 2023-04-12 DIAGNOSIS — R9389 Abnormal findings on diagnostic imaging of other specified body structures: Secondary | ICD-10-CM | POA: Diagnosis not present

## 2023-04-12 DIAGNOSIS — D62 Acute posthemorrhagic anemia: Secondary | ICD-10-CM | POA: Diagnosis not present

## 2023-04-12 DIAGNOSIS — I130 Hypertensive heart and chronic kidney disease with heart failure and stage 1 through stage 4 chronic kidney disease, or unspecified chronic kidney disease: Secondary | ICD-10-CM | POA: Diagnosis present

## 2023-04-12 DIAGNOSIS — N183 Chronic kidney disease, stage 3 unspecified: Secondary | ICD-10-CM | POA: Diagnosis present

## 2023-04-12 DIAGNOSIS — K3532 Acute appendicitis with perforation and localized peritonitis, without abscess: Secondary | ICD-10-CM | POA: Diagnosis not present

## 2023-04-12 DIAGNOSIS — Z7982 Long term (current) use of aspirin: Secondary | ICD-10-CM

## 2023-04-12 DIAGNOSIS — T8119XA Other postprocedural shock, initial encounter: Secondary | ICD-10-CM | POA: Diagnosis not present

## 2023-04-12 DIAGNOSIS — Z7902 Long term (current) use of antithrombotics/antiplatelets: Secondary | ICD-10-CM

## 2023-04-12 DIAGNOSIS — E785 Hyperlipidemia, unspecified: Secondary | ICD-10-CM | POA: Diagnosis present

## 2023-04-12 DIAGNOSIS — M7121 Synovial cyst of popliteal space [Baker], right knee: Secondary | ICD-10-CM | POA: Diagnosis not present

## 2023-04-12 DIAGNOSIS — N170 Acute kidney failure with tubular necrosis: Secondary | ICD-10-CM | POA: Diagnosis not present

## 2023-04-12 DIAGNOSIS — N1832 Chronic kidney disease, stage 3b: Secondary | ICD-10-CM | POA: Diagnosis present

## 2023-04-12 DIAGNOSIS — R918 Other nonspecific abnormal finding of lung field: Secondary | ICD-10-CM | POA: Diagnosis not present

## 2023-04-12 DIAGNOSIS — J9811 Atelectasis: Secondary | ICD-10-CM | POA: Diagnosis not present

## 2023-04-12 DIAGNOSIS — I4819 Other persistent atrial fibrillation: Secondary | ICD-10-CM | POA: Diagnosis not present

## 2023-04-12 DIAGNOSIS — I480 Paroxysmal atrial fibrillation: Secondary | ICD-10-CM | POA: Diagnosis not present

## 2023-04-12 DIAGNOSIS — J929 Pleural plaque without asbestos: Secondary | ICD-10-CM | POA: Diagnosis not present

## 2023-04-12 DIAGNOSIS — I25119 Atherosclerotic heart disease of native coronary artery with unspecified angina pectoris: Secondary | ICD-10-CM | POA: Diagnosis not present

## 2023-04-12 DIAGNOSIS — I2119 ST elevation (STEMI) myocardial infarction involving other coronary artery of inferior wall: Secondary | ICD-10-CM | POA: Diagnosis not present

## 2023-04-12 DIAGNOSIS — Q630 Accessory kidney: Secondary | ICD-10-CM | POA: Diagnosis not present

## 2023-04-12 DIAGNOSIS — G8929 Other chronic pain: Secondary | ICD-10-CM | POA: Diagnosis present

## 2023-04-12 DIAGNOSIS — I509 Heart failure, unspecified: Secondary | ICD-10-CM | POA: Diagnosis not present

## 2023-04-12 DIAGNOSIS — R578 Other shock: Secondary | ICD-10-CM | POA: Diagnosis not present

## 2023-04-12 DIAGNOSIS — E782 Mixed hyperlipidemia: Secondary | ICD-10-CM

## 2023-04-12 DIAGNOSIS — I9581 Postprocedural hypotension: Secondary | ICD-10-CM | POA: Diagnosis not present

## 2023-04-12 DIAGNOSIS — Z7901 Long term (current) use of anticoagulants: Secondary | ICD-10-CM

## 2023-04-12 DIAGNOSIS — R846 Abnormal cytological findings in specimens from respiratory organs and thorax: Secondary | ICD-10-CM | POA: Diagnosis not present

## 2023-04-12 DIAGNOSIS — K219 Gastro-esophageal reflux disease without esophagitis: Secondary | ICD-10-CM | POA: Diagnosis present

## 2023-04-12 DIAGNOSIS — J984 Other disorders of lung: Secondary | ICD-10-CM | POA: Diagnosis not present

## 2023-04-12 DIAGNOSIS — Y838 Other surgical procedures as the cause of abnormal reaction of the patient, or of later complication, without mention of misadventure at the time of the procedure: Secondary | ICD-10-CM | POA: Diagnosis not present

## 2023-04-12 DIAGNOSIS — Z85828 Personal history of other malignant neoplasm of skin: Secondary | ICD-10-CM

## 2023-04-12 DIAGNOSIS — J9601 Acute respiratory failure with hypoxia: Secondary | ICD-10-CM | POA: Diagnosis not present

## 2023-04-12 DIAGNOSIS — R570 Cardiogenic shock: Secondary | ICD-10-CM | POA: Diagnosis not present

## 2023-04-12 DIAGNOSIS — R34 Anuria and oliguria: Secondary | ICD-10-CM | POA: Diagnosis not present

## 2023-04-12 DIAGNOSIS — Z452 Encounter for adjustment and management of vascular access device: Secondary | ICD-10-CM | POA: Diagnosis not present

## 2023-04-12 DIAGNOSIS — N179 Acute kidney failure, unspecified: Secondary | ICD-10-CM | POA: Diagnosis not present

## 2023-04-12 DIAGNOSIS — Z9861 Coronary angioplasty status: Secondary | ICD-10-CM | POA: Diagnosis not present

## 2023-04-12 DIAGNOSIS — Z86008 Personal history of in-situ neoplasm of other site: Secondary | ICD-10-CM

## 2023-04-12 DIAGNOSIS — I82401 Acute embolism and thrombosis of unspecified deep veins of right lower extremity: Secondary | ICD-10-CM | POA: Diagnosis not present

## 2023-04-12 DIAGNOSIS — I808 Phlebitis and thrombophlebitis of other sites: Secondary | ICD-10-CM | POA: Diagnosis not present

## 2023-04-12 HISTORY — DX: Non-ST elevation (NSTEMI) myocardial infarction: I21.4

## 2023-04-12 LAB — CBC
HCT: 42.4 % (ref 36.0–46.0)
Hemoglobin: 14 g/dL (ref 12.0–15.0)
MCH: 28.7 pg (ref 26.0–34.0)
MCHC: 33 g/dL (ref 30.0–36.0)
MCV: 86.9 fL (ref 80.0–100.0)
Platelets: 143 10*3/uL — ABNORMAL LOW (ref 150–400)
RBC: 4.88 MIL/uL (ref 3.87–5.11)
RDW: 13.4 % (ref 11.5–15.5)
WBC: 6.3 10*3/uL (ref 4.0–10.5)
nRBC: 0 % (ref 0.0–0.2)

## 2023-04-12 LAB — HEPATIC FUNCTION PANEL
ALT: 21 U/L (ref 0–44)
AST: 34 U/L (ref 15–41)
Albumin: 3.8 g/dL (ref 3.5–5.0)
Alkaline Phosphatase: 76 U/L (ref 38–126)
Bilirubin, Direct: 0.2 mg/dL (ref 0.0–0.2)
Indirect Bilirubin: 0.7 mg/dL (ref 0.3–0.9)
Total Bilirubin: 0.9 mg/dL (ref <1.2)
Total Protein: 7.1 g/dL (ref 6.5–8.1)

## 2023-04-12 LAB — BRAIN NATRIURETIC PEPTIDE: B Natriuretic Peptide: 270.4 pg/mL — ABNORMAL HIGH (ref 0.0–100.0)

## 2023-04-12 LAB — LIPID PANEL
Cholesterol: 151 mg/dL (ref 0–200)
HDL: 61 mg/dL (ref >40)
LDL Cholesterol: 67 mg/dL (ref 0–99)
Total CHOL/HDL Ratio: 2.5 {ratio}
Triglycerides: 115 mg/dL (ref <150)
VLDL: 23 mg/dL (ref 0–40)

## 2023-04-12 LAB — TSH: TSH: 3.374 u[IU]/mL (ref 0.350–4.500)

## 2023-04-12 LAB — TROPONIN I (HIGH SENSITIVITY)
Troponin I (High Sensitivity): 27 ng/L — ABNORMAL HIGH (ref <18)
Troponin I (High Sensitivity): 609 ng/L (ref <18)

## 2023-04-12 LAB — D-DIMER, QUANTITATIVE: D-Dimer, Quant: 0.79 ug{FEU}/mL — ABNORMAL HIGH (ref 0.00–0.50)

## 2023-04-12 LAB — T4, FREE: Free T4: 0.81 ng/dL (ref 0.61–1.12)

## 2023-04-12 LAB — LACTIC ACID, PLASMA: Lactic Acid, Venous: 1.4 mmol/L (ref 0.5–1.9)

## 2023-04-12 LAB — BASIC METABOLIC PANEL
Anion gap: 10 (ref 5–15)
BUN: 24 mg/dL — ABNORMAL HIGH (ref 8–23)
CO2: 23 mmol/L (ref 22–32)
Calcium: 8.6 mg/dL — ABNORMAL LOW (ref 8.9–10.3)
Chloride: 108 mmol/L (ref 98–111)
Creatinine, Ser: 1.29 mg/dL — ABNORMAL HIGH (ref 0.44–1.00)
GFR, Estimated: 42 mL/min — ABNORMAL LOW (ref >60)
Glucose, Bld: 187 mg/dL — ABNORMAL HIGH (ref 70–99)
Potassium: 3.4 mmol/L — ABNORMAL LOW (ref 3.5–5.1)
Sodium: 141 mmol/L (ref 135–145)

## 2023-04-12 MED ORDER — FENTANYL 25 MCG/HR TD PT72
2.0000 | MEDICATED_PATCH | TRANSDERMAL | Status: DC
  Filled 2023-04-12: qty 2

## 2023-04-12 MED ORDER — ASPIRIN 81 MG PO TBEC
81.0000 mg | DELAYED_RELEASE_TABLET | Freq: Every day | ORAL | Status: DC
  Administered 2023-04-13 – 2023-04-14 (×2): 81 mg via ORAL
  Filled 2023-04-12 (×2): qty 1

## 2023-04-12 MED ORDER — ATORVASTATIN CALCIUM 20 MG PO TABS: 20.0000 mg | ORAL_TABLET | Freq: Every day | ORAL | Status: DC

## 2023-04-12 MED ORDER — IOHEXOL 350 MG/ML SOLN
60.0000 mL | Freq: Once | INTRAVENOUS | Status: AC | PRN
  Administered 2023-04-12: 60 mL via INTRAVENOUS

## 2023-04-12 MED ORDER — CLOPIDOGREL BISULFATE 75 MG PO TABS: 75.0000 mg | ORAL_TABLET | Freq: Every day | ORAL | Status: DC

## 2023-04-12 MED ORDER — SODIUM CHLORIDE 0.9 % IV SOLN: 250.0000 mL | INTRAVENOUS | Status: DC | PRN

## 2023-04-12 MED ORDER — ATORVASTATIN CALCIUM 20 MG PO TABS: 80.0000 mg | ORAL_TABLET | Freq: Every day | ORAL | Status: DC

## 2023-04-12 MED ORDER — ACETAMINOPHEN 325 MG PO TABS: 650.0000 mg | ORAL_TABLET | ORAL | Status: DC | PRN

## 2023-04-12 MED ORDER — APIXABAN 2.5 MG PO TABS
2.5000 mg | ORAL_TABLET | Freq: Two times a day (BID) | ORAL | Status: DC
  Filled 2023-04-12: qty 1

## 2023-04-12 MED ORDER — CARVEDILOL 6.25 MG PO TABS: 6.2500 mg | ORAL_TABLET | Freq: Two times a day (BID) | ORAL | Status: DC

## 2023-04-12 MED ORDER — SODIUM CHLORIDE 0.9% FLUSH: 3.0000 mL | INTRAVENOUS | Status: DC | PRN

## 2023-04-12 MED ORDER — ATORVASTATIN CALCIUM 20 MG PO TABS: 10.0000 mg | ORAL_TABLET | Freq: Every day | ORAL | Status: DC

## 2023-04-12 MED ORDER — PREGABALIN 75 MG PO CAPS: 150.0000 mg | ORAL_CAPSULE | Freq: Two times a day (BID) | ORAL | Status: DC

## 2023-04-12 MED ORDER — NITROGLYCERIN 0.4 MG SL SUBL
0.4000 mg | SUBLINGUAL_TABLET | SUBLINGUAL | Status: DC | PRN
  Administered 2023-04-13: 0.4 mg via SUBLINGUAL
  Filled 2023-04-12: qty 1

## 2023-04-12 MED ORDER — EZETIMIBE 10 MG PO TABS: 10.0000 mg | ORAL_TABLET | Freq: Every day | ORAL | Status: DC

## 2023-04-12 MED ORDER — SODIUM CHLORIDE 0.9% FLUSH
3.0000 mL | Freq: Two times a day (BID) | INTRAVENOUS | Status: DC
  Administered 2023-04-12: 3 mL via INTRAVENOUS

## 2023-04-12 MED ORDER — ONDANSETRON HCL 4 MG/2ML IJ SOLN
4.0000 mg | Freq: Once | INTRAMUSCULAR | Status: DC
Start: 2023-04-12 — End: 2023-04-20
  Filled 2023-04-12: qty 2

## 2023-04-12 MED ORDER — HYDRALAZINE HCL 20 MG/ML IJ SOLN
20.0000 mg | Freq: Once | INTRAMUSCULAR | Status: AC
  Administered 2023-04-12: 20 mg via INTRAVENOUS
  Filled 2023-04-12: qty 1

## 2023-04-12 MED ORDER — ONDANSETRON HCL 4 MG/2ML IJ SOLN
4.0000 mg | Freq: Four times a day (QID) | INTRAMUSCULAR | Status: DC | PRN
  Administered 2023-04-13: 4 mg via INTRAVENOUS
  Filled 2023-04-12: qty 2

## 2023-04-12 NOTE — Assessment & Plan Note (Signed)
Lab Results  Component Value Date   CREATININE 1.29 (H) 04/12/2023   CREATININE 1.11 (H) 06/01/2022   CREATININE 1.47 (H) 01/20/2022  Will follow avoid nephrotoxic agents and contrast studies.

## 2023-04-12 NOTE — ED Triage Notes (Signed)
Pt to ED [facility] from home for chest pain started an hour ago. Received 324 ASA, nitroglycerin SL PTA. Improved chest pain. HTN, complaint with meds.

## 2023-04-12 NOTE — Assessment & Plan Note (Signed)
Vitals:   04/12/23 1518 04/12/23 1737 04/12/23 1830 04/12/23 1845  BP: (!) 173/86 (!) 180/112 137/78 114/67   04/12/23 1900  BP: 122/71  Home regimen consists of Coreg and valsartan we will continue patient on Coreg and decrease valsartan to 80 mg and monitor for range of motion improvement.

## 2023-04-12 NOTE — Telephone Encounter (Signed)
  Chief Complaint: elevated BP chest pain per patient husband with pt now  Symptoms: chest tightness, BP 204/129 pulse 88 prior to call. BP now 200/126 chest pain worsening Frequency: prior to call and earlier today more than 5 minutes  Pertinent Negatives: Patient denies difficulty breathing  Disposition: [x] ED /[] Urgent Care (no appt availability in office) / [] Appointment(In office/virtual)/ []  [facility]/ [] Home Care/ [] Refused Recommended Disposition /[] [facility]/ []  Follow-up with PCP Additional Notes:   Recommended and offered to contact 911. Patient husband said he would call 911 now.       Reason for Disposition  [1] Chest pain lasts > 5 minutes AND [2] history of heart disease (i.e., angina, heart attack, heart failure, bypass surgery, takes nitroglycerin)  Answer Assessment - Initial Assessment Questions 1. LOCATION: "Where does it hurt?"       Chest pain elevated BP 200/126  2. RADIATION: "Does the pain go anywhere else?" (e.g., into neck, jaw, arms, back)     Na  3. ONSET: "When did the chest pain begin?" (Minutes, hours or days)      Few minutes ago  4. PATTERN: "Does the pain come and go, or has it been constant since it started?"  "Does it get worse with exertion?"      Na  5. DURATION: "How long does it last" (e.g., seconds, minutes, hours)     Na  6. SEVERITY: "How bad is the pain?"  (e.g., Scale 1-10; mild, moderate, or severe)    - MILD (1-3): doesn't interfere with normal activities     - MODERATE (4-7): interferes with normal activities or awakens from sleep    - SEVERE (8-10): excruciating pain, unable to do any normal activities       Worsening chest pain and tightness 7. CARDIAC RISK FACTORS: "Do you have any history of heart problems or risk factors for heart disease?" (e.g., angina, prior heart attack; diabetes, high blood pressure, high cholesterol, smoker, or strong family history of heart disease)     See hx  8. PULMONARY RISK  FACTORS: "Do you have any history of lung disease?"  (e.g., blood clots in lung, asthma, emphysema, birth control pills)     Na  9. CAUSE: "What do you think is causing the chest pain?"     Elevated BP  10. OTHER SYMPTOMS: "Do you have any other symptoms?" (e.g., dizziness, nausea, vomiting, sweating, fever, difficulty breathing, cough)       Chest pain elevated BP  11. PREGNANCY: "Is there any chance you are pregnant?" "When was your last menstrual period?"       Na  Protocols used: Chest Pain-A-AH

## 2023-04-12 NOTE — ED Provider Notes (Signed)
[facility] Provider Note   Event Date/Time   First MD Initiated Contact with Patient 04/12/23 1749     (approximate) History  Chest Pain and Hypertension  HPI Dawn Bruce is a 77 y.o. female with a past medical history of hypertension and CAD status post 2 stents who presents complaining of chest pain and high blood pressure from home.  Patient states that she was sitting on the couch today when she experienced substernal chest pressure that did not radiate and was not associated with any movement.  Patient states that when she checked her blood pressure it continued to rise despite subsequent readings.  Patient states that she has taken all of her medications on time and as prescribed today.  Patient denies any recent medication changes to her blood pressure medicines.  Patient states that this chest pain occurred and lasted approximately 4 hours before resolving spontaneously as patient's blood pressure decreased. ROS: Patient currently denies any vision changes, tinnitus, difficulty speaking, facial droop, sore throat, shortness of breath, abdominal pain, nausea/vomiting/diarrhea, dysuria, or weakness/numbness/paresthesias in any extremity   Physical Exam  Triage Vital Signs: ED Triage Vitals  Encounter Vitals Group     BP 04/12/23 1518 (!) 173/86     Systolic BP Percentile --      Diastolic BP Percentile --      Pulse Rate 04/12/23 1512 82     Resp 04/12/23 1512 18     Temp 04/12/23 1512 98.1 F (36.7 C)     Temp Source 04/12/23 1737 Oral     SpO2 04/12/23 1512 95 %     Weight 04/12/23 1518 135 lb (61.2 kg)     Height 04/12/23 1518 5\' 3"  (1.6 m)     Head Circumference --      Peak Flow --      Pain Score 04/12/23 1518 0     Pain Loc --      Pain Education --      Exclude from Growth Chart --    Most recent vital signs: Vitals:   04/12/23 2230 04/12/23 2240  BP:    Pulse:    Resp: 16   Temp:  98.1 F (36.7 C)  SpO2:     General: Awake,  oriented x4. CV:  Good peripheral perfusion.  Resp:  Normal effort.  Abd:  No distention.  Other:  Elderly well-developed, well-nourished Caucasian female resting comfortably in no acute distress ED Results / Procedures / Treatments  Labs (all labs ordered are listed, but only abnormal results are displayed) Labs Reviewed  BASIC METABOLIC PANEL - Abnormal; Notable for the following components:      Result Value   Potassium 3.4 (*)    Glucose, Bld 187 (*)    BUN 24 (*)    Creatinine, Ser 1.29 (*)    Calcium 8.6 (*)    GFR, Estimated 42 (*)    All other components within normal limits  CBC - Abnormal; Notable for the following components:   Platelets 143 (*)    All other components within normal limits  BRAIN NATRIURETIC PEPTIDE - Abnormal; Notable for the following components:   B Natriuretic Peptide 270.4 (*)    All other components within normal limits  D-DIMER, QUANTITATIVE - Abnormal; Notable for the following components:   D-Dimer, Quant 0.79 (*)    All other components within normal limits  TROPONIN I (HIGH SENSITIVITY) - Abnormal; Notable for the following components:   Troponin I (High Sensitivity) 27 (*)  All other components within normal limits  TROPONIN I (HIGH SENSITIVITY) - Abnormal; Notable for the following components:   Troponin I (High Sensitivity) 609 (*)    All other components within normal limits  HEPATIC FUNCTION PANEL  LACTIC ACID, PLASMA  T4, FREE  TSH  LIPID PANEL  LIPOPROTEIN A (LPA)   EKG ED ECG REPORT I, Merwyn Katos, the attending physician, personally viewed and interpreted this ECG. Date: 04/12/2023 EKG Time: 1516 Rate: 81 Rhythm: normal sinus rhythm QRS Axis: normal Intervals: normal ST/T Wave abnormalities: normal Narrative Interpretation: no evidence of acute ischemia RADIOLOGY ED MD interpretation: 2 view chest x-ray and currently interpreted shows increase in small to moderate left pleural effusion with progressive adjacent  left base airspace disease.  Cardiomegaly without congestive failure -Agree with radiology assessment Official radiology report(s): DG Chest 2 View  Result Date: 04/12/2023 CLINICAL DATA:  Chest pain.  Hypertension. EXAM: CHEST - 2 VIEW COMPARISON:  10/06/2021 FINDINGS: Midline trachea. Mild cardiomegaly. Increase in small to moderate left pleural effusion. Biapical pleural thickening. No pneumothorax. No congestive failure. Worsened left lower lobe airspace disease. IMPRESSION: Increase in small to moderate left pleural effusion. Progressive adjacent left base airspace disease which could represent atelectasis or infection. Cardiomegaly without congestive failure. Electronically Signed   By: Jeronimo Greaves M.D.   On: 04/12/2023 16:58   PROCEDURES: Critical Care performed: Yes, see critical care procedure note(s) .1-3 Lead EKG Interpretation  Performed by: Merwyn Katos, MD Authorized by: Merwyn Katos, MD     Interpretation: normal     ECG rate:  71   ECG rate assessment: normal     Rhythm: sinus rhythm     Ectopy: none     Conduction: normal    MEDICATIONS ORDERED IN ED: Medications  ondansetron (ZOFRAN) injection 4 mg (0 mg Intravenous Hold 04/12/23 1925)  sodium chloride flush (NS) 0.9 % injection 3 mL (has no administration in time range)  sodium chloride flush (NS) 0.9 % injection 3 mL (has no administration in time range)  0.9 %  sodium chloride infusion (has no administration in time range)  aspirin EC tablet 81 mg (has no administration in time range)  nitroGLYCERIN (NITROSTAT) SL tablet 0.4 mg (has no administration in time range)  acetaminophen (TYLENOL) tablet 650 mg (has no administration in time range)  ondansetron (ZOFRAN) injection 4 mg (has no administration in time range)  apixaban (ELIQUIS) tablet 2.5 mg (has no administration in time range)  carvedilol (COREG) tablet 6.25 mg (has no administration in time range)  clopidogrel (PLAVIX) tablet 75 mg (has no  administration in time range)  ezetimibe (ZETIA) tablet 10 mg (has no administration in time range)  fentaNYL (DURAGESIC) 25 MCG/HR 2 patch (has no administration in time range)  pregabalin (LYRICA) capsule 150 mg (has no administration in time range)  atorvastatin (LIPITOR) tablet 20 mg (has no administration in time range)  iohexol (OMNIPAQUE) 350 MG/ML injection 60 mL (has no administration in time range)  hydrALAZINE (APRESOLINE) injection 20 mg (20 mg Intravenous Given 04/12/23 1809)   IMPRESSION / MDM / ASSESSMENT AND PLAN / ED COURSE  I reviewed the triage vital signs and the nursing notes.                             The patient is on the cardiac monitor to evaluate for evidence of arrhythmia and/or significant heart rate changes. Patient's presentation is most consistent with acute  presentation with potential threat to life or bodily function. Workup: ECG, CXR, CBC, CMP, Troponin Findings: ECG: No overt evidence of STEMI. No evidence of Brugada's sign, delta wave, epsilon wave, significantly prolonged QTc, or malignant arrhythmia Troponin: 27-->609 Other Labs unremarkable for emergent problems. CXR: Without PTX, PNA, or widened mediastinum HEART Score: 5  Given History, Exam, and Workup concern for NSTEMI.  I have low suspicion for Pneumothorax, Pneumonia, Pulmonary Embolus, Tamponade, Aortic Dissection  Interventions: ASA 324or325mg  Apixaban  Dispo: Admit   FINAL CLINICAL IMPRESSION(S) / ED DIAGNOSES   Final diagnoses:  None   Rx / DC Orders   ED Discharge Orders     None      Note:  This document was prepared using Dragon voice recognition software and may include unintentional dictation errors.   Merwyn Katos, MD 04/12/23 [phone]

## 2023-04-12 NOTE — Assessment & Plan Note (Addendum)
The patient Eliquis, aspirin 81, Lipitor at reduced dose secondary ST elevation which I suspect is really from the rhabdomyolysis, continue her Coreg zetia and as needed nitroglycerin. Strict I/O, weights.

## 2023-04-12 NOTE — ED Notes (Signed)
Austin RN to bedside to place ultrasound IV.

## 2023-04-12 NOTE — ED Notes (Signed)
CT informed this RN of unstable IV. [place] Charge asked to place an ultrasound IV. Will inform Ct when pt is ready.

## 2023-04-12 NOTE — ED Notes (Signed)
Pt states she is not feeling well. States she feels cold all over and feels a discomfort in her chest. When asked further about discomfort, pt stated she is experiencing a dull ache at a 2/10 on the pain scale.

## 2023-04-12 NOTE — H&P (Addendum)
History and Physical    Patient: Dawn Bruce WGN:[id number] DOB: 11/18/1945 DOA: 04/12/2023 DOS: the patient was seen and examined on 04/13/2023 PCP: Erasmo Downer, MD  Patient coming from: Home  Chief Complaint:  Chief Complaint  Patient presents with   Chest Pain   Hypertension    HPI: Dawn Bruce is a 77 y.o. female with medical history significant for essential hypertension history of tobacco abuse, history of skin cancer , presenting today with chest discomfort.  Patient has a patient has a history of heart disease and stents.  Has seen Dr. Janee Morn for cardiology.  Patient was brought by EMS when the chest pain started about an hour ago patient was in hypertensive emergency on presentation.  Chest pain started at rest when she was in with she checked her blood pressure found that her blood pressure was very high.  Pain continued about 4 hours and persisted and got worse she decided to come to the emergency room. At bedside she is alert awake oriented room sinus rhythm no reports of fevers chills trauma travel.  Spouse at bedside.  Discussed with patient that her presentation for chest pressure and her blood work are also concerning for an NSTEMI but it is also necessary that we identify any VTE's which may require CT scan of the chest I will confirm that she can indeed have 1 with her current abnormal renal function and order if she meets the threshold for creatinine.  In emergency room vitals trend shows: Vitals:   04/12/23 2230 04/12/23 2240 04/13/23 0115 04/13/23 0121  BP:    (!) 136/111  Pulse:   95 97  Temp:  98.1 F (36.7 C)    Resp: 16  12 16   Height:      Weight:      SpO2:   97% 99%  TempSrc:  Oral    BMI (Calculated):      EKG today was sinus rhythm at 81 with no ST-T wave changes.  Troponin 0.4 initially was 27 and repeat was 609. Labs are notable for : Metabolic panel with potassium of 3.4 glucose 187 CKD stage IIIa creatinine 1.29 with a EGFR of  42. LFTs within normal limits. CBC showing thrombocytopenia of 143 otherwise normal.  Thrombocytopenia is chronic.  In the ED pt received: Medications  ondansetron (ZOFRAN) injection 4 mg (0 mg Intravenous Hold 04/12/23 1925)  sodium chloride flush (NS) 0.9 % injection 3 mL (3 mLs Intravenous Given 04/12/23 2318)  sodium chloride flush (NS) 0.9 % injection 3 mL (has no administration in time range)  0.9 %  sodium chloride infusion (has no administration in time range)  aspirin EC tablet 81 mg (81 mg Oral Not Given 04/12/23 2318)  nitroGLYCERIN (NITROSTAT) SL tablet 0.4 mg (has no administration in time range)  acetaminophen (TYLENOL) tablet 650 mg (has no administration in time range)  ondansetron (ZOFRAN) injection 4 mg (has no administration in time range)  melatonin tablet 5 mg (5 mg Oral Given 04/13/23 0215)  carvedilol (COREG) tablet 6.25 mg (has no administration in time range)  clopidogrel (PLAVIX) tablet 75 mg (has no administration in time range)  ezetimibe (ZETIA) tablet 10 mg (has no administration in time range)  atorvastatin (LIPITOR) tablet 20 mg (has no administration in time range)  pregabalin (LYRICA) capsule 150 mg (has no administration in time range)  fentaNYL (DURAGESIC) 25 MCG/HR 2 patch (has no administration in time range)  heparin bolus via infusion 3,700 Units (has no administration in time range)  heparin ADULT infusion 100 units/mL (25000 units/231mL) (has no administration in time range)  hydrALAZINE (APRESOLINE) injection 20 mg (20 mg Intravenous Given 04/12/23 1809)  iohexol (OMNIPAQUE) 350 MG/ML injection 60 mL (60 mLs Intravenous Contrast Given 04/12/23 2325)   Review of Systems  Cardiovascular:  Positive for chest pain.  All other systems reviewed and are negative.  Past Medical History:  Diagnosis Date   Actinic keratosis    Chronic pain    GERD (gastroesophageal reflux disease) ?   Taking Pepcid   History of basal cell carcinoma (BCC) 05/02/2021    right upper back paraspinal   History of SCC (squamous cell carcinoma) of skin 09/10/2019   left dorsum wrist ED&C done 10/28/19   History of SCC (squamous cell carcinoma) of skin 03/04/2020   right dorsum hand   History of squamous cell carcinoma in situ (SCCIS) 03/04/2020   left dorsum wrist  ED&C   History of squamous cell carcinoma in situ (SCCIS) 03/04/2020   right medial infraorbital  ED&C 04/28/2020   History of squamous cell carcinoma in situ (SCCIS) 10/28/2019   right dorsum hand proximal lateral   History of squamous cell carcinoma in situ (SCCIS) 10/28/2019   right dorsum hand proximal medial   Hyperlipidemia    Hypertension    NSTEMI (non-ST elevated myocardial infarction) (HCC) 04/12/2023   Renal disorder    Squamous cell carcinoma in situ 10/31/2021   left distal  tricep, EDC   Squamous cell carcinoma of skin 08/28/2022   Right volar forearm - EDC   Past Surgical History:  Procedure Laterality Date   ABDOMINAL HYSTERECTOMY     APPENDECTOMY  April 2023   Burst appendix   AUGMENTATION MAMMAPLASTY Bilateral    25-30 years ago   CARDIOVERSION N/A 12/01/2020   Procedure: CARDIOVERSION;  Surgeon: Debbe Odea, MD;  Location: [facility] ORS;  Service: Cardiovascular;  Laterality: N/A;   CAROTID ARTERY ANGIOPLASTY Right 1995(approximate)   CAROTID ENDARTERECTOMY  2001   COSMETIC SURGERY     ELBOW SURGERY Left    FRACTURE SURGERY  2016   Arm   THORACENTESIS N/A 12/27/2020   Procedure: THORACENTESIS;  Surgeon: Josephine Igo, DO;  Location: [facility];  Service: Pulmonary;  Laterality: N/A;   TONSILLECTOMY     TUBAL LIGATION  ?    reports that she quit smoking about 34 years ago. Her smoking use included cigarettes. She started smoking about 49 years ago. She has a 3.8 pack-year smoking history. She has been exposed to tobacco smoke. She has never used smokeless tobacco. She reports current alcohol use of about 14.0 standard drinks of alcohol per week. She reports  that she does not use drugs.  No Known Allergies  Family History  Problem Relation Age of Onset   Cancer Mother    Cancer Father    Cancer Sister    Breast cancer Neg Hx     Prior to Admission medications   Medication Sig Start Date End Date Taking? Authorizing Provider  apixaban (ELIQUIS) 2.5 MG TABS tablet Take 2.5 mg by mouth 2 (two) times daily.    [provider]  atorvastatin (LIPITOR) 80 MG tablet Take 80 mg by mouth daily.    [provider]  carvedilol (COREG) 6.25 MG tablet Take 1 tablet (6.25 mg total) by mouth 2 (two) times daily. 04/27/22   Erasmo Downer, MD  clopidogrel (PLAVIX) 75 MG tablet Take 1 tablet (75 mg total) by mouth daily. 04/02/23   Erasmo Downer,  MD  ezetimibe (ZETIA) 10 MG tablet TAKE 1 TABLET(10 MG) BY MOUTH DAILY 04/05/23   Bacigalupo, Marzella Schlein, MD  famotidine (PEPCID) 40 MG tablet Take 1 tablet (40 mg total) by mouth daily. 06/28/20   Erasmo Downer, MD  fentaNYL (DURAGESIC) 25 MCG/HR Place 2 patches onto the skin every 3 (three) days. 04/04/23   Ostwalt, Edmon Crape, PA-C  pregabalin (LYRICA) 150 MG capsule Take 1 capsule (150 mg total) by mouth 2 (two) times daily. 01/19/23   Erasmo Downer, MD  traZODone (DESYREL) 50 MG tablet Take 0.5-1 tablets (25-50 mg total) by mouth at bedtime as needed for sleep. 09/21/22   Erasmo Downer, MD  valsartan (DIOVAN) 160 MG tablet Take 160 mg by mouth in the morning and at bedtime. 08/30/21   [provider]     Vitals:   04/12/23 2230 04/12/23 2240 04/13/23 0115 04/13/23 0121  BP:    (!) 136/111  Pulse:   95 97  Resp: 16  12 16   Temp:  98.1 F (36.7 C)    TempSrc:  Oral    SpO2:   97% 99%  Weight:      Height:       Physical Exam Vitals and nursing note reviewed.  Constitutional:      General: She is not in acute distress. HENT:     Head: Normocephalic and atraumatic.     Right Ear: Hearing normal.     Left Ear: Hearing normal.     Nose: Nose normal. No  nasal deformity.     Mouth/Throat:     Lips: Pink.     Tongue: No lesions.     Pharynx: Oropharynx is clear.  Eyes:     General: Lids are normal.     Extraocular Movements: Extraocular movements intact.  Cardiovascular:     Rate and Rhythm: Normal rate and regular rhythm.     Heart sounds: Normal heart sounds.  Pulmonary:     Effort: Pulmonary effort is normal.     Breath sounds: Examination of the right-upper field reveals decreased breath sounds. Examination of the right-middle field reveals decreased breath sounds. Examination of the right-lower field reveals decreased breath sounds. Decreased breath sounds present. No wheezing.  Abdominal:     General: Bowel sounds are normal. There is no distension.     Palpations: Abdomen is soft. There is no mass.     Tenderness: There is no abdominal tenderness.  Musculoskeletal:     Right lower leg: No edema.     Left lower leg: No edema.  Skin:    General: Skin is warm.  Neurological:     General: No focal deficit present.     Mental Status: She is alert and oriented to person, place, and time.     Cranial Nerves: Cranial nerves 2-12 are intact.  Psychiatric:        Attention and Perception: Attention normal.        Mood and Affect: Mood normal.        Speech: Speech normal.        Behavior: Behavior normal. Behavior is cooperative.      Labs on Admission: I have personally reviewed following labs and imaging studies Results for orders placed or performed during the hospital encounter of 04/12/23 (from the past 24 hour(s))  Basic metabolic panel     Status: Abnormal   Collection Time: 04/12/23  3:22 PM  Result Value Ref Range   Sodium 141 135 - 145  mmol/L   Potassium 3.4 (L) 3.5 - 5.1 mmol/L   Chloride 108 98 - 111 mmol/L   CO2 23 22 - 32 mmol/L   Glucose, Bld 187 (H) 70 - 99 mg/dL   BUN 24 (H) 8 - 23 mg/dL   Creatinine, Ser 1.61 (H) 0.44 - 1.00 mg/dL   Calcium 8.6 (L) 8.9 - 10.3 mg/dL   GFR, Estimated 42 (L) >60 mL/min    Anion gap 10 5 - 15  CBC     Status: Abnormal   Collection Time: 04/12/23  3:22 PM  Result Value Ref Range   WBC 6.3 4.0 - 10.5 K/uL   RBC 4.88 3.87 - 5.11 MIL/uL   Hemoglobin 14.0 12.0 - 15.0 g/dL   HCT 09.6 04.5 - 40.9 %   MCV 86.9 80.0 - 100.0 fL   MCH 28.7 26.0 - 34.0 pg   MCHC 33.0 30.0 - 36.0 g/dL   RDW 81.1 91.4 - 78.2 %   Platelets 143 (L) 150 - 400 K/uL   nRBC 0.0 0.0 - 0.2 %  Troponin I (High Sensitivity)     Status: Abnormal   Collection Time: 04/12/23  3:22 PM  Result Value Ref Range   Troponin I (High Sensitivity) 27 (H) <18 ng/L  Brain natriuretic peptide     Status: Abnormal   Collection Time: 04/12/23  3:22 PM  Result Value Ref Range   B Natriuretic Peptide 270.4 (H) 0.0 - 100.0 pg/mL  Troponin I (High Sensitivity)     Status: Abnormal   Collection Time: 04/12/23  5:39 PM  Result Value Ref Range   Troponin I (High Sensitivity) 609 (HH) <18 ng/L  Hepatic function panel     Status: None   Collection Time: 04/12/23  5:39 PM  Result Value Ref Range   Total Protein 7.1 6.5 - 8.1 g/dL   Albumin 3.8 3.5 - 5.0 g/dL   AST 34 15 - 41 U/L   ALT 21 0 - 44 U/L   Alkaline Phosphatase 76 38 - 126 U/L   Total Bilirubin 0.9 <1.2 mg/dL   Bilirubin, Direct 0.2 0.0 - 0.2 mg/dL   Indirect Bilirubin 0.7 0.3 - 0.9 mg/dL  T4, free     Status: None   Collection Time: 04/12/23  5:39 PM  Result Value Ref Range   Free T4 0.81 0.61 - 1.12 ng/dL  TSH     Status: None   Collection Time: 04/12/23  5:39 PM  Result Value Ref Range   TSH 3.374 0.350 - 4.500 uIU/mL  Lipid panel     Status: None   Collection Time: 04/12/23  5:39 PM  Result Value Ref Range   Cholesterol 151 0 - 200 mg/dL   Triglycerides 956 <213 mg/dL   HDL 61 >08 mg/dL   Total CHOL/HDL Ratio 2.5 RATIO   VLDL 23 0 - 40 mg/dL   LDL Cholesterol 67 0 - 99 mg/dL  Lactic acid, plasma     Status: None   Collection Time: 04/12/23  7:37 PM  Result Value Ref Range   Lactic Acid, Venous 1.4 0.5 - 1.9 mmol/L  D-dimer,  quantitative     Status: Abnormal   Collection Time: 04/12/23  9:47 PM  Result Value Ref Range   D-Dimer, Quant 0.79 (H) 0.00 - 0.50 ug/mL-FEU  Troponin I (High Sensitivity)     Status: Abnormal   Collection Time: 04/13/23  1:47 AM  Result Value Ref Range   Troponin I (High Sensitivity) 979 (HH) <18  ng/L    CBC: Recent Labs  Lab 04/12/23 1522  WBC 6.3  HGB 14.0  HCT 42.4  MCV 86.9  PLT 143*   Basic Metabolic Panel: Recent Labs  Lab 04/12/23 1522  NA 141  K 3.4*  CL 108  CO2 23  GLUCOSE 187*  BUN 24*  CREATININE 1.29*  CALCIUM 8.6*   GFR: Estimated Creatinine Clearance: 29.3 mL/min (A) (by C-G formula based on SCr of 1.29 mg/dL (H)). Liver Function Tests: Recent Labs  Lab 04/12/23 1739  AST 34  ALT 21  ALKPHOS 76  BILITOT 0.9  PROT 7.1  ALBUMIN 3.8   No results for input(s): "LIPASE", "AMYLASE" in the last 168 hours. No results for input(s): "AMMONIA" in the last 168 hours. Coagulation Profile: No results for input(s): "INR", "PROTIME" in the last 168 hours. Cardiac Enzymes: No results for input(s): "CKTOTAL", "CKMB", "CKMBINDEX", "TROPONINI" in the last 168 hours. BNP (last 3 results) No results for input(s): "PROBNP" in the last 8760 hours. HbA1C: No results for input(s): "HGBA1C" in the last 72 hours. CBG: No results for input(s): "GLUCAP" in the last 168 hours. Lipid Profile: Recent Labs    04/12/23 1739  CHOL 151  HDL 61  LDLCALC 67  TRIG 115  CHOLHDL 2.5   Thyroid Function Tests: Recent Labs    04/12/23 1739  TSH 3.374  FREET4 0.81   Anemia Panel: No results for input(s): "VITAMINB12", "FOLATE", "FERRITIN", "TIBC", "IRON", "RETICCTPCT" in the last 72 hours.   Unresulted Labs (From admission, onward)     Start     Ordered   04/13/23 0500  Lipoprotein A (LPA)  Tomorrow morning,   R        04/12/23 2036   04/13/23 0500  Comprehensive metabolic panel  Tomorrow morning,   R        04/13/23 0401   04/13/23 0500  CBC with  Differential/Platelet  Tomorrow morning,   R        04/13/23 0401   04/13/23 0500  Magnesium  Tomorrow morning,   R        04/13/23 0401   04/13/23 0500  Phosphorus  Tomorrow morning,   R        04/13/23 0401            Medications  ondansetron (ZOFRAN) injection 4 mg (0 mg Intravenous Hold 04/12/23 1925)  sodium chloride flush (NS) 0.9 % injection 3 mL (3 mLs Intravenous Given 04/12/23 2318)  sodium chloride flush (NS) 0.9 % injection 3 mL (has no administration in time range)  0.9 %  sodium chloride infusion (has no administration in time range)  aspirin EC tablet 81 mg (81 mg Oral Not Given 04/12/23 2318)  nitroGLYCERIN (NITROSTAT) SL tablet 0.4 mg (has no administration in time range)  acetaminophen (TYLENOL) tablet 650 mg (has no administration in time range)  ondansetron (ZOFRAN) injection 4 mg (has no administration in time range)  melatonin tablet 5 mg (5 mg Oral Given 04/13/23 0215)  carvedilol (COREG) tablet 6.25 mg (has no administration in time range)  clopidogrel (PLAVIX) tablet 75 mg (has no administration in time range)  ezetimibe (ZETIA) tablet 10 mg (has no administration in time range)  atorvastatin (LIPITOR) tablet 20 mg (has no administration in time range)  pregabalin (LYRICA) capsule 150 mg (has no administration in time range)  fentaNYL (DURAGESIC) 25 MCG/HR 2 patch (has no administration in time range)  heparin bolus via infusion 3,700 Units (has no administration in time range)  heparin ADULT infusion 100 units/mL (25000 units/237mL) (has no administration in time range)  hydrALAZINE (APRESOLINE) injection 20 mg (20 mg Intravenous Given 04/12/23 1809)  iohexol (OMNIPAQUE) 350 MG/ML injection 60 mL (60 mLs Intravenous Contrast Given 04/12/23 2325)    Radiological Exams on Admission: CT Angio Chest Pulmonary Embolism (PE) W or WO Contrast  Result Date: 04/12/2023 CLINICAL DATA:  Chest pain for 1 hour, hypertension EXAM: CT ANGIOGRAPHY CHEST WITH CONTRAST  TECHNIQUE: Multidetector CT imaging of the chest was performed using the standard protocol during bolus administration of intravenous contrast. Multiplanar CT image reconstructions and MIPs were obtained to evaluate the vascular anatomy. RADIATION DOSE REDUCTION: This exam was performed according to the departmental dose-optimization program which includes automated exposure control, adjustment of the mA and/or kV according to patient size and/or use of iterative reconstruction technique. CONTRAST:  60mL OMNIPAQUE IOHEXOL 350 MG/ML SOLN COMPARISON:  12/28/2020, 04/12/2023 FINDINGS: Cardiovascular: This is a technically adequate evaluation of the pulmonary vasculature. No filling defects or pulmonary emboli. The heart is unremarkable without pericardial effusion. Normal caliber of the thoracic aorta. Atherosclerosis of the aorta and coronary vasculature. Mediastinum/Nodes: No enlarged mediastinal, hilar, or axillary lymph nodes. Thyroid gland, trachea, and esophagus demonstrate no significant findings. Lungs/Pleura: Large left pleural effusion, volume estimated in excess of 1 L. dependent consolidation within the left lower lobe most consistent with atelectasis. There is a trace right pleural effusion. No airspace disease or pneumothorax. Bilateral bronchial wall thickening, greatest in the lower lobes. Upper Abdomen: No acute abnormality. Musculoskeletal: No acute or destructive bony abnormalities. Reconstructed images demonstrate no additional findings. Review of the MIP images confirms the above findings. IMPRESSION: 1. No evidence of pulmonary embolus. 2. Large left pleural effusion, volume estimated in excess of 1 L. Dependent left lower lobe atelectasis. 3. Trace right pleural effusion. 4. Bilateral bronchial wall thickening, greatest in the lower lobes, which may reflect bronchitis or reactive airway disease. No evidence of pneumonia. 5. Aortic Atherosclerosis (ICD10-I70.0). Coronary artery atherosclerosis.  Electronically Signed   By: Sharlet Salina M.D.   On: 04/12/2023 23:46   DG Chest 2 View  Result Date: 04/12/2023 CLINICAL DATA:  Chest pain.  Hypertension. EXAM: CHEST - 2 VIEW COMPARISON:  10/06/2021 FINDINGS: Midline trachea. Mild cardiomegaly. Increase in small to moderate left pleural effusion. Biapical pleural thickening. No pneumothorax. No congestive failure. Worsened left lower lobe airspace disease. IMPRESSION: Increase in small to moderate left pleural effusion. Progressive adjacent left base airspace disease which could represent atelectasis or infection. Cardiomegaly without congestive failure. Electronically Signed   By: Jeronimo Greaves M.D.   On: 04/12/2023 16:58     Data Reviewed: Relevant notes from primary care and specialist visits, past discharge summaries as available in EHR, including Care Everywhere. Prior diagnostic testing as pertinent to current admission diagnoses Updated medications and problem lists for reconciliation ED course, including vitals, labs, imaging, treatment and response to treatment Triage notes, nursing and pharmacy notes and ED provider's notes Notable results as noted in HPI  Assessment and Plan: * Chest pain Initially thought to be due to NSTEMI. Troponin reordered to follow trend.  CTA of the chest ordered is negative for pulmonary embolism but does show a large left pleural effusion and will defer to a.m. team to coordinate with IR for thoracentesis.     NSTEMI (non-ST elevated myocardial infarction) [facility]) Patient presenting with substernal chest pressure very concerning for ischemic heart disease and NSTEMI related.  Patient also takes frequent trips and is at high risk embolism.  CTA however negative for pulmonary embolism.  Negative for pneumonia.  Does show bilateral pleural effusion with a left pleural effusion to be over 1 L and significant.  This is most likely the cause of the patient's chest discomfort third troponin is pending.   Currently I will continue patient on her home regimen of Eliquis 2.5 aspirin 81 along with statin therapy Lipitor 20 Coreg 6.25 and Zetia 10 mg with as needed nitroglycerin. Will d/c eliquis and change to heparin if next troponin cont to rise.  We have started heparin gtt at about 4 AM / hold eliquis.  TNI is rising and do not suspect this is demand.     CKD (chronic kidney disease) stage 3, GFR 30-59 ml/min (HCC) Lab Results  Component Value Date   CREATININE 1.29 (H) 04/12/2023   CREATININE 1.11 (H) 06/01/2022   CREATININE 1.47 (H) 01/20/2022  Will follow avoid nephrotoxic agents and contrast studies.   Essential hypertension Vitals:   04/12/23 1518 04/12/23 1737 04/12/23 1830 04/12/23 1845  BP: (!) 173/86 (!) 180/112 137/78 114/67   04/12/23 1900  BP: 122/71  Home regimen consists of Coreg and valsartan we will continue patient on Coreg and decrease valsartan to 80 mg and monitor for range of motion improvement.   Chronic diastolic heart failure (HCC) The patient Eliquis, aspirin 81, Lipitor at reduced dose secondary ST elevation which I suspect is really from the rhabdomyolysis, continue her Coreg zetia and as needed nitroglycerin. Strict I/O, weights.   Tobacco abuse Patient has been smoking 3 cigarettes intermittently and occasionally only essentially is non-smoking.    DVT prophylaxis:  Eliquis.   Consults:  Cardiology: [facility].  Advance Care Planning:    Code Status: Full Code   Family Communication:  Spouse.   Disposition Plan:  Home   Severity of Illness: The appropriate patient status for this patient is INPATIENT. Inpatient status is judged to be reasonable and necessary in order to provide the required intensity of service to ensure the patient's safety. The patient's presenting symptoms, physical exam findings, and initial radiographic and laboratory data in the context of their chronic comorbidities is felt to place them at high risk for further clinical  deterioration. Furthermore, it is not anticipated that the patient will be medically stable for discharge from the hospital within 2 midnights of admission.   * I certify that at the point of admission it is my clinical judgment that the patient will require inpatient hospital care spanning beyond 2 midnights from the point of admission due to high intensity of service, high risk for further deterioration and high frequency of surveillance required.*  Author: Gertha Calkin, MD 04/13/2023 4:06 AM  For on call review [email or web address].

## 2023-04-13 ENCOUNTER — Encounter
Admission: EM | Disposition: A | Discharge status: Home Health | Payer: Self-pay | Source: Home / Self Care | Attending: Internal Medicine

## 2023-04-13 ENCOUNTER — Inpatient Hospital Stay: Payer: Medicare Other

## 2023-04-13 ENCOUNTER — Other Ambulatory Visit: Payer: Self-pay

## 2023-04-13 ENCOUNTER — Inpatient Hospital Stay: Payer: Medicare Other | Admitting: Anesthesiology

## 2023-04-13 ENCOUNTER — Encounter: Payer: Self-pay | Admitting: Internal Medicine

## 2023-04-13 DIAGNOSIS — I214 Non-ST elevation (NSTEMI) myocardial infarction: Secondary | ICD-10-CM | POA: Diagnosis not present

## 2023-04-13 DIAGNOSIS — J9601 Acute respiratory failure with hypoxia: Secondary | ICD-10-CM | POA: Diagnosis not present

## 2023-04-13 DIAGNOSIS — Z720 Tobacco use: Secondary | ICD-10-CM | POA: Diagnosis present

## 2023-04-13 HISTORY — PX: LEFT HEART CATH AND CORONARY ANGIOGRAPHY: CATH118249

## 2023-04-13 HISTORY — PX: CORONARY STENT INTERVENTION: CATH118234

## 2023-04-13 HISTORY — PX: ENDARTERECTOMY FEMORAL: SHX5804

## 2023-04-13 LAB — CBC WITH DIFFERENTIAL/PLATELET
Abs Immature Granulocytes: 0.06 10*3/uL (ref 0.00–0.07)
Basophils Absolute: 0 10*3/uL (ref 0.0–0.1)
Basophils Relative: 0 %
Eosinophils Absolute: 0 10*3/uL (ref 0.0–0.5)
Eosinophils Relative: 0 %
HCT: 43.6 % (ref 36.0–46.0)
Hemoglobin: 14.3 g/dL (ref 12.0–15.0)
Immature Granulocytes: 1 %
Lymphocytes Relative: 6 %
Lymphs Abs: 0.6 10*3/uL — ABNORMAL LOW (ref 0.7–4.0)
MCH: 28.5 pg (ref 26.0–34.0)
MCHC: 32.8 g/dL (ref 30.0–36.0)
MCV: 86.9 fL (ref 80.0–100.0)
Monocytes Absolute: 0.7 10*3/uL (ref 0.1–1.0)
Monocytes Relative: 8 %
Neutro Abs: 8.1 10*3/uL — ABNORMAL HIGH (ref 1.7–7.7)
Neutrophils Relative %: 85 %
Platelets: 192 10*3/uL (ref 150–400)
RBC: 5.02 MIL/uL (ref 3.87–5.11)
RDW: 13.8 % (ref 11.5–15.5)
WBC: 9.5 10*3/uL (ref 4.0–10.5)
nRBC: 0 % (ref 0.0–0.2)

## 2023-04-13 LAB — LACTATE DEHYDROGENASE, PLEURAL OR PERITONEAL FLUID: LD, Fluid: 49 U/L — ABNORMAL HIGH (ref 3–23)

## 2023-04-13 LAB — COMPREHENSIVE METABOLIC PANEL
ALT: 21 U/L (ref 0–44)
AST: 38 U/L (ref 15–41)
Albumin: 3.6 g/dL (ref 3.5–5.0)
Alkaline Phosphatase: 86 U/L (ref 38–126)
Anion gap: 14 (ref 5–15)
BUN: 24 mg/dL — ABNORMAL HIGH (ref 8–23)
CO2: 21 mmol/L — ABNORMAL LOW (ref 22–32)
Calcium: 9.3 mg/dL (ref 8.9–10.3)
Chloride: 107 mmol/L (ref 98–111)
Creatinine, Ser: 1.24 mg/dL — ABNORMAL HIGH (ref 0.44–1.00)
GFR, Estimated: 44 mL/min — ABNORMAL LOW (ref >60)
Glucose, Bld: 124 mg/dL — ABNORMAL HIGH (ref 70–99)
Potassium: 3.9 mmol/L (ref 3.5–5.1)
Sodium: 142 mmol/L (ref 135–145)
Total Bilirubin: 1 mg/dL (ref <1.2)
Total Protein: 6.9 g/dL (ref 6.5–8.1)

## 2023-04-13 LAB — APTT
aPTT: 33 s (ref 24–36)
aPTT: 81 s — ABNORMAL HIGH (ref 24–36)

## 2023-04-13 LAB — BODY FLUID CELL COUNT WITH DIFFERENTIAL
Eos, Fluid: 0 %
Lymphs, Fluid: 59 %
Monocyte-Macrophage-Serous Fluid: 23 %
Neutrophil Count, Fluid: 18 %
Total Nucleated Cell Count, Fluid: 386 uL

## 2023-04-13 LAB — GLUCOSE, PLEURAL OR PERITONEAL FLUID: Glucose, Fluid: 125 mg/dL

## 2023-04-13 LAB — PROTEIN, PLEURAL OR PERITONEAL FLUID: Total protein, fluid: <3 g/dL

## 2023-04-13 LAB — GLUCOSE, CAPILLARY: Glucose-Capillary: 130 mg/dL — ABNORMAL HIGH (ref 70–99)

## 2023-04-13 LAB — MAGNESIUM: Magnesium: 2 mg/dL (ref 1.7–2.4)

## 2023-04-13 LAB — TROPONIN I (HIGH SENSITIVITY)
Troponin I (High Sensitivity): 979 ng/L (ref <18)
Troponin I (High Sensitivity): 1954 ng/L (ref <18)

## 2023-04-13 LAB — PROTIME-INR
INR: 1.1 (ref 0.8–1.2)
Prothrombin Time: 14.1 s (ref 11.4–15.2)

## 2023-04-13 LAB — PHOSPHORUS: Phosphorus: 3.9 mg/dL (ref 2.5–4.6)

## 2023-04-13 LAB — POCT ACTIVATED CLOTTING TIME: Activated Clotting Time: 475 s

## 2023-04-13 LAB — HEPARIN LEVEL (UNFRACTIONATED): Heparin Unfractionated: 1.07 [IU]/mL — ABNORMAL HIGH (ref 0.30–0.70)

## 2023-04-13 SURGERY — LEFT HEART CATH AND CORONARY ANGIOGRAPHY
Anesthesia: Moderate Sedation

## 2023-04-13 SURGERY — ENDARTERECTOMY, FEMORAL
Anesthesia: General | Laterality: Right

## 2023-04-13 MED ORDER — CEFAZOLIN SODIUM-DEXTROSE 2-3 GM-%(50ML) IV SOLR
INTRAVENOUS | Status: DC | PRN
  Administered 2023-04-13: 2 g via INTRAVENOUS

## 2023-04-13 MED ORDER — ASPIRIN 81 MG PO CHEW: 81.0000 mg | CHEWABLE_TABLET | Freq: Every day | ORAL | Status: DC

## 2023-04-13 MED ORDER — FENTANYL CITRATE (PF) 100 MCG/2ML IJ SOLN
INTRAMUSCULAR | Status: AC
  Administered 2023-04-13: 50 ug
  Filled 2023-04-13: qty 2

## 2023-04-13 MED ORDER — SODIUM CHLORIDE 0.9 % IV SOLN: 250.0000 mL | INTRAVENOUS | Status: AC

## 2023-04-13 MED ORDER — SODIUM CHLORIDE 0.9% FLUSH: 3.0000 mL | Freq: Two times a day (BID) | INTRAVENOUS | Status: DC

## 2023-04-13 MED ORDER — CLOPIDOGREL BISULFATE 75 MG PO TABS
75.0000 mg | ORAL_TABLET | Freq: Every day | ORAL | Status: DC
  Administered 2023-04-14: 75 mg via ORAL
  Filled 2023-04-13: qty 1

## 2023-04-13 MED ORDER — CLOPIDOGREL BISULFATE 75 MG PO TABS
ORAL_TABLET | ORAL | Status: DC | PRN
  Administered 2023-04-13: 225 mg via ORAL

## 2023-04-13 MED ORDER — SODIUM CHLORIDE 0.9 % IV SOLN: 250.0000 mL | INTRAVENOUS | Status: DC | PRN

## 2023-04-13 MED ORDER — LACTATED RINGERS IV SOLN: INTRAVENOUS | Status: DC | PRN

## 2023-04-13 MED ORDER — LIDOCAINE HCL 1 % IJ SOLN
INTRAMUSCULAR | Status: AC
  Filled 2023-04-13: qty 20

## 2023-04-13 MED ORDER — MELATONIN 5 MG PO TABS
5.0000 mg | ORAL_TABLET | Freq: Every day | ORAL | Status: DC
  Administered 2023-04-13: 5 mg via ORAL
  Filled 2023-04-13: qty 1

## 2023-04-13 MED ORDER — LIDOCAINE HCL (PF) 1 % IJ SOLN
INTRAMUSCULAR | Status: DC | PRN
  Administered 2023-04-13: 2 mL

## 2023-04-13 MED ORDER — SODIUM CHLORIDE 0.9 % WEIGHT BASED INFUSION: 1.0000 mL/kg/h | INTRAVENOUS | Status: DC

## 2023-04-13 MED ORDER — CLOPIDOGREL BISULFATE 75 MG PO TABS
ORAL_TABLET | ORAL | Status: AC
  Filled 2023-04-13: qty 3

## 2023-04-13 MED ORDER — CLOPIDOGREL BISULFATE 75 MG PO TABS: 75.0000 mg | ORAL_TABLET | Freq: Every day | ORAL | Status: DC

## 2023-04-13 MED ORDER — HEPARIN BOLUS VIA INFUSION
3700.0000 [IU] | Freq: Once | INTRAVENOUS | Status: AC
  Administered 2023-04-13: 3700 [IU] via INTRAVENOUS
  Filled 2023-04-13: qty 3700

## 2023-04-13 MED ORDER — VASOPRESSIN 20 UNIT/ML IV SOLN
INTRAVENOUS | Status: DC | PRN
  Administered 2023-04-13 (×2): 4 [IU] via INTRAVENOUS
  Administered 2023-04-13: 2 [IU] via INTRAVENOUS

## 2023-04-13 MED ORDER — ASPIRIN 81 MG PO CHEW
CHEWABLE_TABLET | ORAL | Status: AC
Start: 2023-04-13 — End: ?
  Filled 2023-04-13: qty 1

## 2023-04-13 MED ORDER — LIDOCAINE HCL (PF) 1 % IJ SOLN
15.0000 mL | Freq: Once | INTRAMUSCULAR | Status: AC
  Administered 2023-04-13: 15 mL via INTRADERMAL

## 2023-04-13 MED ORDER — NOREPINEPHRINE 4 MG/250ML-% IV SOLN: 2.0000 ug/min | INTRAVENOUS | Status: DC

## 2023-04-13 MED ORDER — BIVALIRUDIN TRIFLUOROACETATE 250 MG IV SOLR
INTRAVENOUS | Status: AC
  Filled 2023-04-13: qty 250

## 2023-04-13 MED ORDER — BIVALIRUDIN BOLUS VIA INFUSION - CUPID
INTRAVENOUS | Status: DC | PRN
  Administered 2023-04-13: 43.575 mg via INTRAVENOUS

## 2023-04-13 MED ORDER — SODIUM CHLORIDE 0.9 % WEIGHT BASED INFUSION: 3.0000 mL/kg/h | INTRAVENOUS | Status: AC

## 2023-04-13 MED ORDER — SODIUM CHLORIDE 0.9 % IV SOLN: INTRAVENOUS | Status: DC

## 2023-04-13 MED ORDER — ROCURONIUM BROMIDE 10 MG/ML (PF) SYRINGE
PREFILLED_SYRINGE | INTRAVENOUS | Status: AC
  Filled 2023-04-13: qty 10

## 2023-04-13 MED ORDER — VASOPRESSIN 20 UNITS/100 ML INFUSION FOR SHOCK
0.0000 [IU]/min | INTRAVENOUS | Status: DC
  Administered 2023-04-13: 0.03 [IU]/min via INTRAVENOUS
  Administered 2023-04-14 (×2): 0.04 [IU]/min via INTRAVENOUS
  Filled 2023-04-13 (×4): qty 100

## 2023-04-13 MED ORDER — FENTANYL CITRATE (PF) 100 MCG/2ML IJ SOLN
INTRAMUSCULAR | Status: AC
  Filled 2023-04-13: qty 2

## 2023-04-13 MED ORDER — IOHEXOL 350 MG/ML SOLN
80.0000 mL | Freq: Once | INTRAVENOUS | Status: AC | PRN
  Administered 2023-04-13: 80 mL via INTRAVENOUS

## 2023-04-13 MED ORDER — ETOMIDATE 2 MG/ML IV SOLN
INTRAVENOUS | Status: AC
  Filled 2023-04-13: qty 10

## 2023-04-13 MED ORDER — MIDAZOLAM HCL 2 MG/2ML IJ SOLN
INTRAMUSCULAR | Status: AC
  Filled 2023-04-13: qty 2

## 2023-04-13 MED ORDER — EZETIMIBE 10 MG PO TABS
10.0000 mg | ORAL_TABLET | Freq: Every day | ORAL | Status: DC
  Administered 2023-04-13 – 2023-04-14 (×2): 10 mg via ORAL
  Filled 2023-04-13 (×2): qty 1

## 2023-04-13 MED ORDER — DOPAMINE-DEXTROSE 3.2-5 MG/ML-% IV SOLN
INTRAVENOUS | Status: AC
  Filled 2023-04-13: qty 250

## 2023-04-13 MED ORDER — IRBESARTAN 150 MG PO TABS
75.0000 mg | ORAL_TABLET | Freq: Every day | ORAL | Status: DC
  Administered 2023-04-13: 75 mg via ORAL
  Filled 2023-04-13: qty 1

## 2023-04-13 MED ORDER — HEPARIN (PORCINE) IN NACL 1000-0.9 UT/500ML-% IV SOLN
INTRAVENOUS | Status: AC
  Filled 2023-04-13: qty 1000

## 2023-04-13 MED ORDER — ATORVASTATIN CALCIUM 20 MG PO TABS
20.0000 mg | ORAL_TABLET | Freq: Every day | ORAL | Status: DC
  Administered 2023-04-13 – 2023-04-15 (×3): 20 mg via ORAL
  Filled 2023-04-13 (×3): qty 1

## 2023-04-13 MED ORDER — SODIUM CHLORIDE 0.9% FLUSH: 3.0000 mL | INTRAVENOUS | Status: DC | PRN

## 2023-04-13 MED ORDER — MIDAZOLAM HCL 2 MG/2ML IJ SOLN
INTRAMUSCULAR | Status: DC | PRN
  Administered 2023-04-13: 1 mg via INTRAVENOUS

## 2023-04-13 MED ORDER — HEPARIN (PORCINE) IN NACL 2000-0.9 UNIT/L-% IV SOLN
INTRAVENOUS | Status: DC | PRN
  Administered 2023-04-13: 1000 mL

## 2023-04-13 MED ORDER — IOHEXOL 300 MG/ML  SOLN
INTRAMUSCULAR | Status: DC | PRN
  Administered 2023-04-13: 164 mL

## 2023-04-13 MED ORDER — FENTANYL 25 MCG/HR TD PT72
2.0000 | MEDICATED_PATCH | TRANSDERMAL | Status: DC
  Filled 2023-04-13: qty 2

## 2023-04-13 MED ORDER — SODIUM CHLORIDE 0.9 % IV SOLN
INTRAVENOUS | Status: AC | PRN
  Administered 2023-04-13: 1.75 mg/kg/h via INTRAVENOUS

## 2023-04-13 MED ORDER — HYDRALAZINE HCL 20 MG/ML IJ SOLN: 10.0000 mg | INTRAMUSCULAR | Status: AC | PRN

## 2023-04-13 MED ORDER — FUROSEMIDE 10 MG/ML IJ SOLN
INTRAMUSCULAR | Status: AC
  Filled 2023-04-13: qty 4

## 2023-04-13 MED ORDER — PREGABALIN 75 MG PO CAPS
150.0000 mg | ORAL_CAPSULE | Freq: Two times a day (BID) | ORAL | Status: DC
  Administered 2023-04-13: 150 mg via ORAL
  Filled 2023-04-13: qty 2

## 2023-04-13 MED ORDER — FENTANYL CITRATE PF 50 MCG/ML IJ SOSY: 50.0000 ug | PREFILLED_SYRINGE | Freq: Once | INTRAMUSCULAR | Status: DC

## 2023-04-13 MED ORDER — HEPARIN (PORCINE) 25000 UT/250ML-% IV SOLN
750.0000 [IU]/h | INTRAVENOUS | Status: DC
  Administered 2023-04-13: 750 [IU]/h via INTRAVENOUS
  Filled 2023-04-13: qty 250

## 2023-04-13 MED ORDER — HEPARIN SODIUM (PORCINE) 5000 UNIT/ML IJ SOLN
INTRAMUSCULAR | Status: AC
  Filled 2023-04-13: qty 1

## 2023-04-13 MED ORDER — FUROSEMIDE 10 MG/ML IJ SOLN
INTRAMUSCULAR | Status: DC | PRN
  Administered 2023-04-13: 40 mg via INTRAVENOUS

## 2023-04-13 MED ORDER — ASPIRIN 81 MG PO CHEW: 81.0000 mg | CHEWABLE_TABLET | ORAL | Status: DC

## 2023-04-13 MED ORDER — LABETALOL HCL 5 MG/ML IV SOLN: 10.0000 mg | INTRAVENOUS | Status: AC | PRN

## 2023-04-13 MED ORDER — HEPARIN SODIUM (PORCINE) 1000 UNIT/ML IJ SOLN: INTRAMUSCULAR | Status: DC | PRN

## 2023-04-13 MED ORDER — HYDRALAZINE HCL 20 MG/ML IJ SOLN: 5.0000 mg | INTRAMUSCULAR | Status: DC | PRN

## 2023-04-13 MED ORDER — ETOMIDATE 2 MG/ML IV SOLN
10.0000 mg | Freq: Once | INTRAVENOUS | Status: AC
  Administered 2023-04-13: 10 mg via INTRAVENOUS

## 2023-04-13 MED ORDER — DOPAMINE-DEXTROSE 3.2-5 MG/ML-% IV SOLN
0.0000 ug/kg/min | INTRAVENOUS | Status: DC
  Administered 2023-04-13: 5 ug/kg/min via INTRAVENOUS

## 2023-04-13 MED ORDER — CARVEDILOL 6.25 MG PO TABS
6.2500 mg | ORAL_TABLET | Freq: Two times a day (BID) | ORAL | Status: DC
  Administered 2023-04-13 – 2023-04-14 (×2): 6.25 mg via ORAL
  Filled 2023-04-13 (×2): qty 1

## 2023-04-13 MED ORDER — ROCURONIUM BROMIDE 10 MG/ML (PF) SYRINGE
1.0000 mg/kg | PREFILLED_SYRINGE | Freq: Once | INTRAVENOUS | Status: AC
  Administered 2023-04-13: 58.1 mg via INTRAVENOUS

## 2023-04-13 MED ORDER — NOREPINEPHRINE 4 MG/250ML-% IV SOLN
INTRAVENOUS | Status: AC
  Administered 2023-04-13: 2 ug/min via INTRAVENOUS
  Filled 2023-04-13: qty 250

## 2023-04-13 MED ORDER — SODIUM CHLORIDE 0.9% IV SOLUTION
Freq: Once | INTRAVENOUS | Status: DC
Start: 2023-04-13 — End: 2023-04-19

## 2023-04-13 MED ORDER — ASPIRIN 81 MG PO TBEC
DELAYED_RELEASE_TABLET | ORAL | Status: DC | PRN
  Administered 2023-04-13: 243 mg via ORAL

## 2023-04-13 MED ORDER — SODIUM CHLORIDE 0.9 % IV SOLN
INTRAVENOUS | Status: AC | PRN
  Administered 2023-04-13: 500 mL/h via INTRAVENOUS

## 2023-04-13 MED ORDER — BUPIVACAINE HCL (PF) 0.5 % IJ SOLN
INTRAMUSCULAR | Status: AC
  Filled 2023-04-13: qty 30

## 2023-04-13 MED ORDER — VASOPRESSIN 20 UNIT/ML IV SOLN
INTRAVENOUS | Status: AC
  Filled 2023-04-13: qty 1

## 2023-04-13 MED ORDER — CLOPIDOGREL BISULFATE 75 MG PO TABS
75.0000 mg | ORAL_TABLET | Freq: Every day | ORAL | Status: DC
  Administered 2023-04-13: 75 mg via ORAL
  Filled 2023-04-13: qty 1

## 2023-04-13 MED ORDER — CEFAZOLIN SODIUM 1 G IJ SOLR
INTRAMUSCULAR | Status: AC
  Filled 2023-04-13: qty 20

## 2023-04-13 MED ORDER — SODIUM CHLORIDE 0.9 % WEIGHT BASED INFUSION: 1.0000 mL/kg/h | INTRAVENOUS | Status: AC

## 2023-04-13 MED ORDER — HYDRALAZINE HCL 20 MG/ML IJ SOLN: 10.0000 mg | INTRAMUSCULAR | Status: DC | PRN

## 2023-04-13 MED ORDER — ASPIRIN 81 MG PO CHEW
CHEWABLE_TABLET | ORAL | Status: AC
Start: 2023-04-13 — End: ?
  Filled 2023-04-13: qty 3

## 2023-04-13 SURGICAL SUPPLY — 67 items
5 PAIRS OF YELLOW SUTURE CLAMP (MISCELLANEOUS) ×1
APPLIER CLIP 11 MED OPEN (CLIP)
APPLIER CLIP 13 LRG OPEN (CLIP)
APPLIER CLIP 9.375 SM OPEN (CLIP)
BAG DECANTER FOR FLEXI CONT (MISCELLANEOUS) ×1 IMPLANT
BAG ISOLATATION DRAPE 20X20 ST (DRAPES) ×1 IMPLANT
BLADE SURG 15 STRL LF DISP TIS (BLADE) ×1 IMPLANT
BLADE SURG SZ11 CARB STEEL (BLADE) ×1 IMPLANT
BRUSH SCRUB EZ 4% CHG (MISCELLANEOUS) ×1 IMPLANT
CHLORAPREP W/TINT 26 (MISCELLANEOUS) ×1 IMPLANT
CLAMP SUTURE YELLOW 5 PAIRS (MISCELLANEOUS) ×1 IMPLANT
CLIP APPLIE 11 MED OPEN (CLIP) IMPLANT
CLIP APPLIE 13 LRG OPEN (CLIP) IMPLANT
CLIP APPLIE 9.375 SM OPEN (CLIP) IMPLANT
COVER LIGHT HANDLE STERIS (MISCELLANEOUS) IMPLANT
DERMABOND ADVANCED .7 DNX12 (GAUZE/BANDAGES/DRESSINGS) ×1 IMPLANT
DRAPE INCISE IOBAN 66X45 STRL (DRAPES) ×1 IMPLANT
DRAPE LAPAROTOMY 100X77 ABD (DRAPES) ×1 IMPLANT
DRESSING PEEL AND PLAC PRVNA20 (GAUZE/BANDAGES/DRESSINGS) IMPLANT
DRESSING SURGICEL FIBRLLR 1X2 (HEMOSTASIS) ×1 IMPLANT
DRSG OPSITE POSTOP 4X6 (GAUZE/BANDAGES/DRESSINGS) IMPLANT
DRSG OPSITE POSTOP 4X8 (GAUZE/BANDAGES/DRESSINGS) IMPLANT
DRSG PEEL AND PLACE PREVENA 20 (GAUZE/BANDAGES/DRESSINGS) ×1
DRSG SURGICEL FIBRILLAR 1X2 (HEMOSTASIS) ×2
DRSG TEGADERM 4X4.75 (GAUZE/BANDAGES/DRESSINGS) IMPLANT
ELECT CAUTERY BLADE 6.4 (BLADE) ×1 IMPLANT
ELECT REM PT RETURN 9FT ADLT (ELECTROSURGICAL) ×1
ELECTRODE REM PT RTRN 9FT ADLT (ELECTROSURGICAL) ×1 IMPLANT
GAUZE 4X4 16PLY ~~LOC~~+RFID DBL (SPONGE) ×1 IMPLANT
GLOVE BIO SURGEON STRL SZ7 (GLOVE) ×1 IMPLANT
GLOVE SURG SYN 8.0 (GLOVE) ×2 IMPLANT
GLOVE SURG SYN 8.0 PF PI (GLOVE) ×1 IMPLANT
GOWN STRL REUS W/ TWL LRG LVL3 (GOWN DISPOSABLE) ×2 IMPLANT
GOWN STRL REUS W/ TWL XL LVL3 (GOWN DISPOSABLE) ×1 IMPLANT
IV NS 500ML BAXH (IV SOLUTION) ×1 IMPLANT
KIT PREVENA INCISION MGT20CM45 (CANNISTER) IMPLANT
KIT TURNOVER KIT A (KITS) ×1 IMPLANT
LABEL OR SOLS (LABEL) ×1 IMPLANT
LOOP VESSEL MAXI 1X406 RED (MISCELLANEOUS) ×3 IMPLANT
LOOP VESSEL MINI 0.8X406 BLUE (MISCELLANEOUS) ×1 IMPLANT
MANIFOLD NEPTUNE II (INSTRUMENTS) ×1 IMPLANT
NDL HYPO 22X1.5 SAFETY MO (MISCELLANEOUS) ×1 IMPLANT
NEEDLE HYPO 22X1.5 SAFETY MO (MISCELLANEOUS) ×1 IMPLANT
NS IRRIG 500ML POUR BTL (IV SOLUTION) IMPLANT
PACK BASIN MAJOR ARMC (MISCELLANEOUS) ×1 IMPLANT
PACK UNIVERSAL (MISCELLANEOUS) ×1 IMPLANT
SPIKE FLUID TRANSFER (MISCELLANEOUS) IMPLANT
SPONGE T-LAP 18X18 ~~LOC~~+RFID (SPONGE) ×2 IMPLANT
STAPLER SKIN PROX 35W (STAPLE) IMPLANT
SUT MNCRL+ 5-0 UNDYED PC-3 (SUTURE) ×1 IMPLANT
SUT PROLENE 5 0 RB 1 DA (SUTURE) ×2 IMPLANT
SUT PROLENE 6 0 BV (SUTURE) ×4 IMPLANT
SUT PROLENE 7 0 BV 1 (SUTURE) ×2 IMPLANT
SUT SILK 2-0 18XBRD TIE 12 (SUTURE) ×1 IMPLANT
SUT SILK 3 0 SH CR/8 (SUTURE) IMPLANT
SUT SILK 3-0 18XBRD TIE 12 (SUTURE) ×1 IMPLANT
SUT SILK 4-0 18XBRD TIE 12 (SUTURE) ×1 IMPLANT
SUT VIC AB 0 SH 27 (SUTURE) IMPLANT
SUT VIC AB 2-0 CT1 (SUTURE) ×2 IMPLANT
SUT VICRYL+ 3-0 36IN CT-1 (SUTURE) ×2 IMPLANT
SYR 20ML LL LF (SYRINGE) ×2 IMPLANT
SYR 5ML LL (SYRINGE) ×1 IMPLANT
TAG SUTURE CLAMP YLW 5PR (MISCELLANEOUS) ×1
TOWEL OR 17X26 4PK STRL BLUE (TOWEL DISPOSABLE) IMPLANT
TRAP FLUID SMOKE EVACUATOR (MISCELLANEOUS) ×1 IMPLANT
TRAY FOLEY MTR SLVR 16FR STAT (SET/KITS/TRAYS/PACK) ×1 IMPLANT
WATER STERILE IRR 500ML POUR (IV SOLUTION) ×1 IMPLANT

## 2023-04-13 SURGICAL SUPPLY — 23 items
BALLN TREK RX 2.5X12 (BALLOONS) ×1
BALLN ~~LOC~~ TREK NEO RX 3.75X12 (BALLOONS) ×1
BALLOON TREK RX 2.5X12 (BALLOONS) IMPLANT
BALLOON ~~LOC~~ TREK NEO RX 3.75X12 (BALLOONS) IMPLANT
CATH INFINITI 5FR MULTPACK ANG (CATHETERS) IMPLANT
CATH VISTA GUIDE 6FR JL3.5 (CATHETERS) IMPLANT
CATH VISTA GUIDE 6FR XB3 (CATHETERS) IMPLANT
CATH VISTA GUIDE 6FR XB3.5 (CATHETERS) IMPLANT
DEVICE CLOSURE MYNXGRIP 6/7F (Vascular Products) IMPLANT
KIT ENCORE 26 ADVANTAGE (KITS) IMPLANT
NDL PERC 18GX7CM (NEEDLE) IMPLANT
NEEDLE PERC 18GX7CM (NEEDLE) ×1 IMPLANT
PACK CARDIAC CATH (CUSTOM PROCEDURE TRAY) ×1 IMPLANT
PROTECTION STATION PRESSURIZED (MISCELLANEOUS) ×1
SET ATX-X65L (MISCELLANEOUS) IMPLANT
SHEATH AVANTI 5FR X 11CM (SHEATH) IMPLANT
SHEATH AVANTI 6FR X 11CM (SHEATH) IMPLANT
STATION PROTECTION PRESSURIZED (MISCELLANEOUS) IMPLANT
STENT ONYX FRONTIER 3.5X12 (Permanent Stent) IMPLANT
TUBING CIL FLEX 10 FLL-RA (TUBING) IMPLANT
WIRE ASAHI PROWATER 180CM (WIRE) IMPLANT
WIRE GUIDERIGHT .035X150 (WIRE) IMPLANT
WIRE HITORQ VERSACORE ST 145CM (WIRE) IMPLANT

## 2023-04-13 NOTE — Procedures (Signed)
CENTRAL LINE PLACEMENT PROCEDURE NOTE   Patient Name: Dawn Bruce   MRN: [id number]   Date of Birth/ Sex: 1945-11-13 , female      Admission Date: 04/12/2023  Attending Provider: Marcina Millard, MD  Primary Diagnosis: Chest pain    PROCEDURE: U/S Guided Internal jugular central venous catheter   INDICATION (S): Medication administration and Difficult access  PROCEDURE OPERATOR: [name] NP  CONSENT: Unable to obtain consent due to emergent nature of procedure.  PROCEDURE SUMMARY: A time-out was performed. The patient's <RIGHT> neck region was prepped and draped in sterile fashion using chlorhexidine scrub. Anesthesia was achieved with 1% lidocaine. The <RIGHT> internal jugular vein was accessed under ultrasound guidance using a finder needle and sheath. U/S images were permanently documented. Venous blood was withdrawn and the sheath was advanced into the vein and the needle was withdrawn. A guidewire was advanced through the sheath. A small incision was made with a 10-blade scalpel and the sheath was exchanged for a dilator over the guidewire until appropriate dilation was obtained. The dilator was removed and an 8.5 [place] central venous TRIPLE-lumen catheter was advanced over the guidewire and secured into place with 4 sutures at <20> cm. At time of procedure completion, all ports aspirated and flushed properly. Post-procedure x-ray shows the tip of the catheter within the superior vena cava.  COMPLICATIONS: None; patient tolerated the procedure well. Chest X-ray is ordered to verify placement for internal jugular or subclavian cannulation.   Chest x-ray is not ordered for femoral cannulation.  ESTIMATED BLOOD LOSS: None   [name], DNP, CCRN, FNP-C, AGACNP-BC Acute Care & Family Nurse Practitioner  [facility]  See Amion for personal pager PCCM on call pager [phone] until 7 am

## 2023-04-13 NOTE — ED Notes (Signed)
Due to the pt's sob and chest pressure she is now being wheeled to the restroom instead of ambulating. The pt was assisted to the restroom twice without incident.

## 2023-04-13 NOTE — Procedures (Signed)
PROCEDURE SUMMARY:  Successful image-guided left thoracentesis. Yielded 650 milliliters of clear yellow fluid. Patient tolerated procedure well. EBL < 5 mL No immediate complications.  Specimen was sent for labs. Post procedure CXR shows no pneumothorax.  Please see imaging section of Epic for full dictation.  Villa Herb PA-C 04/13/2023 11:54 AM

## 2023-04-13 NOTE — ED Notes (Signed)
Hospital socks applied to pt.

## 2023-04-13 NOTE — Assessment & Plan Note (Addendum)
Patient presenting with substernal chest pressure very concerning for ischemic heart disease and NSTEMI related.  Patient also takes frequent trips and is at high risk embolism.  CTA however negative for pulmonary embolism.  Negative for pneumonia.  Does show bilateral pleural effusion with a left pleural effusion to be over 1 L and significant.  This is most likely the cause of the patient's chest discomfort third troponin is pending.  Currently I will continue patient on her home regimen of Eliquis 2.5 aspirin 81 along with statin therapy Lipitor 20 Coreg 6.25 and Zetia 10 mg with as needed nitroglycerin. Will d/c eliquis and change to heparin if next troponin cont to rise.  We have started heparin gtt at about 4 AM / hold eliquis.  TNI is rising and do not suspect this is demand.

## 2023-04-13 NOTE — Assessment & Plan Note (Signed)
Patient has been smoking 3 cigarettes intermittently and occasionally only essentially is non-smoking.

## 2023-04-13 NOTE — ED Notes (Signed)
Pharm called for heparin

## 2023-04-13 NOTE — Progress Notes (Addendum)
PHARMACY - ANTICOAGULATION CONSULT NOTE  Pharmacy Consult for Heparin  Indication: chest pain/ACS  No Known Allergies  Patient Measurements: Height: 5\' 3"  (160 cm) Weight: 61.2 kg (135 lb) IBW/kg (Calculated) : 52.4 Heparin Dosing Weight: 61.2 kg   Vital Signs: Temp: 98.4 F (36.9 C) (11/22 1359) Temp Source: Oral (11/22 1359) BP: 145/78 (11/22 1430) Pulse Rate: 91 (11/22 1500)  Labs: Recent Labs    04/12/23 1522 04/12/23 1739 04/13/23 0147 04/13/23 0414 04/13/23 0415 04/13/23 1447  HGB 14.0  --   --   --  14.3  --   HCT 42.4  --   --   --  43.6  --   PLT 143*  --   --   --  192  --   APTT  --   --   --   --  33 81*  LABPROT  --   --   --   --  14.1  --   INR  --   --   --   --  1.1  --   HEPARINUNFRC  --   --   --   --  1.07*  --   CREATININE 1.29*  --   --   --  1.24*  --   TROPONINIHS 27* 609* 979* 1,954*  --   --    Estimated Creatinine Clearance: 30.4 mL/min (A) (by C-G formula based on SCr of 1.24 mg/dL (H)).  Medical History: Past Medical History:  Diagnosis Date   Actinic keratosis    Chronic pain    GERD (gastroesophageal reflux disease) ?   Taking Pepcid   History of basal cell carcinoma (BCC) 05/02/2021   right upper back paraspinal   History of SCC (squamous cell carcinoma) of skin 09/10/2019   left dorsum wrist ED&C done 10/28/19   History of SCC (squamous cell carcinoma) of skin 03/04/2020   right dorsum hand   History of squamous cell carcinoma in situ (SCCIS) 03/04/2020   left dorsum wrist  ED&C   History of squamous cell carcinoma in situ (SCCIS) 03/04/2020   right medial infraorbital  ED&C 04/28/2020   History of squamous cell carcinoma in situ (SCCIS) 10/28/2019   right dorsum hand proximal lateral   History of squamous cell carcinoma in situ (SCCIS) 10/28/2019   right dorsum hand proximal medial   Hyperlipidemia    Hypertension    NSTEMI (non-ST elevated myocardial infarction) (HCC) 04/12/2023   Renal disorder    Squamous cell  carcinoma in situ 10/31/2021   left distal  tricep, EDC   Squamous cell carcinoma of skin 08/28/2022   Right volar forearm - EDC   Assessment: Pharmacy consulted to dose heparin in this 77 year old female admitted with ACS/NSTEMI.  Pt was on Eliquis 2.5 mg PO BID PTA, last dose on 11/21 AM. CrCl = 29.3 ml/min   Goal of Therapy:  Heparin level 0.3-0.7 units/ml aPTT 66 - 102 seconds Monitor platelets by anticoagulation protocol: Yes  Labs: 11/22  0415  aPTT 31 / HL 1.07 - subtherapeutic  11/22  1447  aPTT 81 - therapeutic x 1   Plan:  - aPTT therapeutic - Continue heparin infusion at 750 units/hr  - Will check next aPTT in 8 hours at 2300 - Will use aPTT to guide dosing until correlating with HL  - Will check HL on 11/23 with AM labs   Littie Deeds, PharmD Pharmacy Resident  04/13/2023 3:22 PM

## 2023-04-13 NOTE — Consult Note (Signed)
[facility] Vascular Consult Note  MRN : [id number]  Dawn Bruce is a 77 y.o. (15-Jul-1945) female who presents with chief complaint of  Chief Complaint  Patient presents with   Chest Pain   Hypertension  .  History of Present Illness: Patient presented with NSTEMI, underwent Cardiac cath with PCI to LAD via right femoral artery- 6Fr sheath, Mynx closure- subsequent hypotension, CTA found to have retroperitoneal hematoma with active extravasation from distal external Iliac. Critically ill.  Current Facility-Administered Medications  Medication Dose Route Frequency Provider Last Rate Last Admin   0.9 %  sodium chloride infusion (Manually program via Guardrails IV Fluids)   Intravenous Once Jimmye Norman, NP       0.9 %  sodium chloride infusion   Intravenous Continuous [place], [place], PA-C   Stopped at 04/13/23 1715   [START ON 04/14/2023] 0.9 %  sodium chloride infusion  250 mL Intravenous PRN Paraschos, Alexander, MD       0.9 %  sodium chloride infusion  250 mL Intravenous Continuous Paraschos, Alexander, MD       0.9% sodium chloride infusion  1 mL/kg/hr Intravenous Continuous Paraschos, Alexander, MD       0.9% sodium chloride infusion  1 mL/kg/hr Intravenous Continuous Paraschos, Alexander, MD       acetaminophen (TYLENOL) tablet 650 mg  650 mg Oral Q4H PRN Gertha Calkin, MD       [START ON 04/14/2023] aspirin chewable tablet 81 mg  81 mg Oral Daily Paraschos, Alexander, MD       aspirin EC tablet 81 mg  81 mg Oral Daily Irena Cords V, MD   81 mg at 04/13/23 0855   atorvastatin (LIPITOR) tablet 20 mg  20 mg Oral Daily Irena Cords V, MD   20 mg at 04/13/23 1226   carvedilol (COREG) tablet 6.25 mg  6.25 mg Oral BID WC Irena Cords V, MD   6.25 mg at 04/13/23 0855   clopidogrel (PLAVIX) tablet 75 mg  75 mg Oral Daily Gertha Calkin, MD   75 mg at 04/13/23 0854   [START ON 04/14/2023] clopidogrel (PLAVIX) tablet 75 mg  75 mg Oral Q breakfast Paraschos,  Alexander, MD       DOPamine (INTROPIN) 800 mg in dextrose 5 % 250 mL (3.2 mg/mL) infusion  0-20 mcg/kg/min Intravenous Titrated Paraschos, Alexander, MD 5.45 mL/hr at 04/13/23 2144 5 mcg/kg/min at 04/13/23 2144   etomidate (AMIDATE) 2 MG/ML injection            etomidate (AMIDATE) 2 MG/ML injection            ezetimibe (ZETIA) tablet 10 mg  10 mg Oral Daily Irena Cords V, MD   10 mg at 04/13/23 1226   [START ON 04/15/2023] fentaNYL (DURAGESIC) 25 MCG/HR 2 patch  2 patch Transdermal Q72H Gertha Calkin, MD       fentaNYL (SUBLIMAZE) injection 50 mcg  50 mcg Intravenous Once Jimmye Norman, NP       heparin ADULT infusion 100 units/mL (25000 units/293mL)  750 Units/hr Intravenous Continuous Gertha Calkin, MD   Stopped at 04/13/23 1830   hydrALAZINE (APRESOLINE) injection 10 mg  10 mg Intravenous Q20 Min PRN Paraschos, Alexander, MD       hydrALAZINE (APRESOLINE) injection 10 mg  10 mg Intravenous Q20 Min PRN Paraschos, Alexander, MD       hydrALAZINE (APRESOLINE) injection 5 mg  5 mg Intravenous Q4H PRN Sunnie Nielsen, DO  irbesartan (AVAPRO) tablet 75 mg  75 mg Oral Daily Sunnie Nielsen, DO   75 mg at 04/13/23 1226   labetalol (NORMODYNE) injection 10 mg  10 mg Intravenous Q10 min PRN Paraschos, Alexander, MD       melatonin tablet 5 mg  5 mg Oral QHS Irena Cords V, MD   5 mg at 04/13/23 0215   nitroGLYCERIN (NITROSTAT) SL tablet 0.4 mg  0.4 mg Sublingual Q5 Min x 3 PRN Gertha Calkin, MD   0.4 mg at 04/13/23 9381   norepinephrine (LEVOPHED) 4mg  in (0.016 mg/mL) premix infusion  2-10 mcg/min Intravenous Titrated Paraschos, Alexander, MD 7.5 mL/hr at 04/13/23 2150 2 mcg/min at 04/13/23 2150   ondansetron (ZOFRAN) injection 4 mg  4 mg Intravenous Once Merwyn Katos, MD       ondansetron [facility]) injection 4 mg  4 mg Intravenous Q6H PRN Gertha Calkin, MD   4 mg at 04/13/23 2156   pregabalin (LYRICA) capsule 150 mg  150 mg Oral BID Gertha Calkin, MD   150 mg at 04/13/23 1226    rocuronium (ZEMURON) 100 MG/10ML injection            vasopressin (PITRESSIN) 20 Units in 100 mL (0.2 unit/mL) infusion-*FOR SHOCK*  0-0.03 Units/min Intravenous Continuous Jimmye Norman, NP 9 mL/hr at 04/13/23 2237 0.03 Units/min at 04/13/23 2237    Past Medical History:  Diagnosis Date   Actinic keratosis    Chronic pain    GERD (gastroesophageal reflux disease) ?   Taking Pepcid   History of basal cell carcinoma (BCC) 05/02/2021   right upper back paraspinal   History of SCC (squamous cell carcinoma) of skin 09/10/2019   left dorsum wrist ED&C done 10/28/19   History of SCC (squamous cell carcinoma) of skin 03/04/2020   right dorsum hand   History of squamous cell carcinoma in situ (SCCIS) 03/04/2020   left dorsum wrist  ED&C   History of squamous cell carcinoma in situ (SCCIS) 03/04/2020   right medial infraorbital  ED&C 04/28/2020   History of squamous cell carcinoma in situ (SCCIS) 10/28/2019   right dorsum hand proximal lateral   History of squamous cell carcinoma in situ (SCCIS) 10/28/2019   right dorsum hand proximal medial   Hyperlipidemia    Hypertension    NSTEMI (non-ST elevated myocardial infarction) (HCC) 04/12/2023   Renal disorder    Squamous cell carcinoma in situ 10/31/2021   left distal  tricep, EDC   Squamous cell carcinoma of skin 08/28/2022   Right volar forearm - EDC    Past Surgical History:  Procedure Laterality Date   ABDOMINAL HYSTERECTOMY     APPENDECTOMY  April 2023   Burst appendix   AUGMENTATION MAMMAPLASTY Bilateral    25-30 years ago   CARDIOVERSION N/A 12/01/2020   Procedure: CARDIOVERSION;  Surgeon: Debbe Odea, MD;  Location: [facility] ORS;  Service: Cardiovascular;  Laterality: N/A;   CAROTID ARTERY ANGIOPLASTY Right 1995(approximate)   CAROTID ENDARTERECTOMY  2001   COSMETIC SURGERY     ELBOW SURGERY Left    FRACTURE SURGERY  2016   Arm   THORACENTESIS N/A 12/27/2020   Procedure: THORACENTESIS;  Surgeon: Josephine Igo, DO;  Location: [facility];  Service: Pulmonary;  Laterality: N/A;   TONSILLECTOMY     TUBAL LIGATION  ?    Social History Social History   Tobacco Use   Smoking status: Former    Current packs/day: 0.00    Average packs/day:  0.3 packs/day for 15.0 years (3.8 ttl pk-yrs)    Types: Cigarettes    Start date: 64    Quit date: 66    Years since quitting: 34.9    Passive exposure: Past   Smokeless tobacco: Never   Tobacco comments:    quit around 1990-1995 (?)  Vaping Use   Vaping status: Never Used  Substance Use Topics   Alcohol use: Yes    Alcohol/week: 14.0 standard drinks of alcohol    Types: 14 Standard drinks or equivalent per week    Comment: 2 scotch shots   Drug use: Never    Family History Family History  Problem Relation Age of Onset   Cancer Mother    Cancer Father    Cancer Sister    Breast cancer Neg Hx     No Known Allergies   REVIEW OF SYSTEMS (Negative unless checked) Unable to obtain. Constitutional: [] Weight loss  [] Fever  [] Chills Cardiac: [] Chest pain   [] Chest pressure   [] Palpitations   [] Shortness of breath when laying flat   [] Shortness of breath at rest   [] Shortness of breath with exertion. Vascular:  [] Pain in legs with walking   [] Pain in legs at rest   [] Pain in legs when laying flat   [] Claudication   [] Pain in feet when walking  [] Pain in feet at rest  [] Pain in feet when laying flat   [] History of DVT   [] Phlebitis   [] Swelling in legs   [] Varicose veins   [] Non-healing ulcers Pulmonary:   [] Uses home oxygen   [] Productive cough   [] Hemoptysis   [] Wheeze  [] COPD   [] Asthma Neurologic:  [] Dizziness  [] Blackouts   [] Seizures   [] History of stroke   [] History of TIA  [] Aphasia   [] Temporary blindness   [] Dysphagia   [] Weakness or numbness in arms   [] Weakness or numbness in legs Musculoskeletal:  [] Arthritis   [] Joint swelling   [] Joint pain   [] Low back pain Hematologic:  [] Easy bruising  [] Easy bleeding   [] Hypercoagulable  state   [] Anemic  [] Hepatitis Gastrointestinal:  [] Blood in stool   [] Vomiting blood  [] Gastroesophageal reflux/heartburn   [] Difficulty swallowing. Genitourinary:  [] Chronic kidney disease   [] Difficult urination  [] Frequent urination  [] Burning with urination   [] Blood in urine Skin:  [] Rashes   [] Ulcers   [] Wounds Psychological:  [] History of anxiety   []  History of major depression.  Physical Examination  Vitals:   04/13/23 1430 04/13/23 1500 04/13/23 1645 04/13/23 1649  BP: (!) 145/78  (!) 150/87 (!) 150/87  Pulse: (!) 104 91 92 94  Resp: 18  16 16   Temp:   (!) 97.2 F (36.2 C)   TempSrc:   Oral   SpO2: 100% 95% 97% 96%  Weight:   58.1 kg   Height:   5\' 3"  (1.6 m)    Body mass index is 22.67 kg/m. Gen:  Intubated, sedated Pulmonary:  Decreased bilaterally. Cardiac: Tachycardia,  Vascular:  Vessel Right Left  Radial Palpable Palpable  Ulnar    Brachial    Carotid Palpable, without bruit Palpable, without bruit  Aorta Not palpable N/A  Femoral Palpable Palpable  Popliteal    PT    DP     Gastrointestinal: soft, tender/distended.  Musculoskeletal: M/S 5/5 throughout.  Extremities without ischemic changes.  No deformity or atrophy. No edema.      CBC Lab Results  Component Value Date   WBC 9.5 04/13/2023   HGB 14.3 04/13/2023  HCT 43.6 04/13/2023   MCV 86.9 04/13/2023   PLT 192 04/13/2023    BMET    Component Value Date/Time   NA 142 04/13/2023 0415   NA 147 (H) 06/01/2022 0000   K 3.9 04/13/2023 0415   CL 107 04/13/2023 0415   CO2 21 (L) 04/13/2023 0415   GLUCOSE 124 (H) 04/13/2023 0415   BUN 24 (H) 04/13/2023 0415   BUN 23 06/01/2022 0000   CREATININE 1.24 (H) 04/13/2023 0415   CALCIUM 9.3 04/13/2023 0415   GFRNONAA 44 (L) 04/13/2023 0415   GFRAA 54 (L) 10/08/2019 1419   Estimated Creatinine Clearance: 30.4 mL/min (A) (by C-G formula based on SCr of 1.24 mg/dL (H)).  COAG Lab Results  Component Value Date   INR 1.1 04/13/2023     Radiology CARDIAC CATHETERIZATION  Result Date: 04/13/2023   Mid LAD lesion is 30% stenosed.   Ost LAD to Prox LAD lesion is 99% stenosed.   A drug-eluting stent was successfully placed using a STENT ONYX FRONTIER 3.5X12.   Post intervention, there is a 0% residual stenosis.   There is mild left ventricular systolic dysfunction.   The left ventricular ejection fraction is 45-50% by visual estimate. 1.  High-grade in-stent restenosis ostial LAD 2.  Successful PCI with 3.5 x 12 mm Onyx frontier DES 3.  Right groin hematoma Recommendations 1.  Dual antiplatelet therapy uninterrupted 1 year 2.  Abdominal CT to rule out retroperitoneal bleed 3.  Further recommendations pending abdominal CT results   CT Angio Abd/Pel w/ and/or w/o  Result Date: 04/13/2023 CLINICAL DATA:  Suspected retroperitoneal hemorrhage from cath lab EXAM: CTA ABDOMEN AND PELVIS WITHOUT AND WITH CONTRAST TECHNIQUE: Multidetector CT imaging of the abdomen and pelvis was performed using the standard protocol during bolus administration of intravenous contrast. Multiplanar reconstructed images and MIPs were obtained and reviewed to evaluate the vascular anatomy. RADIATION DOSE REDUCTION: This exam was performed according to the departmental dose-optimization program which includes automated exposure control, adjustment of the mA and/or kV according to patient size and/or use of iterative reconstruction technique. CONTRAST:  80mL OMNIPAQUE IOHEXOL 350 MG/ML SOLN COMPARISON:  CT abdomen pelvis, 12/13/2021 FINDINGS: VASCULAR Large right-sided retroperitoneal hematoma arising in the vicinity of the right external iliac artery and extending into the right paracolic gutter, dimensions at least 18.6 x 7.7 x 6.2 cm (series 15, image 44, series 10, image 139). Brisk arterial extravasation arising in the vicinity of the right inferior epigastric artery origin and distal portion of the right external iliac artery (series 10, image 161, series 15,  image 44). Normal contour and caliber of the abdominal aorta. No evidence of aortic aneurysm, dissection, or other acute aortic pathology. 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 Moderate mixed calcific atherosclerosis. Review of the MIP images confirms the above findings. NON-VASCULAR Lower Chest: Moderate left, small right pleural effusions and associated atelectasis or consolidation. Bilateral breast implants small hiatal hernia. Hepatobiliary: No solid liver abnormality is seen. No gallstones, gallbladder wall thickening, or biliary dilatation. Pancreas: Unremarkable. No pancreatic ductal dilatation or surrounding inflammatory changes. Spleen: Normal in size without significant abnormality. Adrenals/Urinary Tract: Adrenal glands are unremarkable. Extensive, lobulated bilateral renal cortical scarring. No calculi or hydronephrosis. Excreted contrast within the collecting systems and bladder following catheterization. Foley catheter in the bladder. Stomach/Bowel: Stomach is within normal limits. Appendix appears normal. No evidence of bowel wall thickening, distention, or inflammatory changes. Sigmoid diverticula. Lymphatic: No enlarged  abdominal or pelvic lymph nodes. Reproductive: No mass or other significant abnormality. Other: No abdominal wall hernia or abnormality. Small volume hemoperitoneum about the liver and in the pelvis (series 10, image 156, series 5, image 17). Musculoskeletal: No acute osseous findings. IMPRESSION: 1. Large right-sided retroperitoneal hematoma arising in the vicinity of the right external iliac artery and extending into the right paracolic gutter, dimensions at least 18.6 x 7.7 x 6.2 cm. 2. Brisk arterial extravasation arising in the vicinity of the right inferior epigastric artery origin and distal portion of the right external iliac artery. 3. Small volume hemoperitoneum about the liver and in  the pelvis. 4. Moderate left, small right pleural effusions and associated atelectasis or consolidation. These results were called by telephone at the time of interpretation on 04/13/2023 at 9:25 pm to [facility] PARASCHOS , who verbally acknowledged these results. Aortic Atherosclerosis (ICD10-I70.0). Electronically Signed   By: Jearld Lesch M.D.   On: 04/13/2023 21:38   US THORACENTESIS ASP PLEURAL SPACE W/IMG GUIDE  Result Date: 04/13/2023 INDICATION: Patient with history of CAD s/p stenting, admitted with complaints of new chest pain and found to have large left pleural effusion. Request for diagnostic and therapeutic left thoracentesis. EXAM: ULTRASOUND GUIDED LEFT THORACENTESIS MEDICATIONS: 15 mL 1% lidocaine COMPLICATIONS: None immediate. PROCEDURE: An ultrasound guided thoracentesis was thoroughly discussed with the patient and questions answered. The benefits, risks, alternatives and complications were also discussed. The patient understands and wishes to proceed with the procedure. Written consent was obtained. Ultrasound was performed to localize and mark an adequate pocket of fluid in the left chest. The area was then prepped and draped in the normal sterile fashion. 1% Lidocaine was used for local anesthesia. Under ultrasound guidance a 6 Fr Safe-T-Centesis catheter was introduced. Thoracentesis was performed. The catheter was removed and a dressing applied. FINDINGS: A total of approximately 650 mL of clear yellow fluid was removed. Samples were sent to the laboratory as requested by the clinical team. IMPRESSION: Successful ultrasound guided left thoracentesis yielding 650 mL of pleural fluid. Performed by Lynnette Caffey, PA-C Electronically Signed   By: Olive Bass M.D.   On: 04/13/2023 14:13   DG Chest Port 1 View  Result Date: 04/13/2023 CLINICAL DATA:  [id number] Pleural effusion on left [id number]. Status post thoracentesis. EXAM: PORTABLE CHEST 1 VIEW COMPARISON:  04/12/2023.  FINDINGS: There is small left pleural effusion with probable associated compressive atelectatic changes in the left lung. There is interval improvement when compared to the prior exam, status post left thoracentesis. No left pneumothorax. Biapical pleural thickening/calcifications again seen. Bilateral lung fields are otherwise clear. There is subtle blunting of right lateral costophrenic angle, which may represent trace right pleural effusion. Normal cardio-mediastinal silhouette. No acute osseous abnormalities. The soft tissues are within normal limits. IMPRESSION: *Small left pleural effusion, improved since the prior study, status post left thoracentesis. No pneumothorax. Electronically Signed   By: Jules Schick M.D.   On: 04/13/2023 12:58   CT Angio Chest Pulmonary Embolism (PE) W or WO Contrast  Result Date: 04/12/2023 CLINICAL DATA:  Chest pain for 1 hour, hypertension EXAM: CT ANGIOGRAPHY CHEST WITH CONTRAST TECHNIQUE: Multidetector CT imaging of the chest was performed using the standard protocol during bolus administration of intravenous contrast. Multiplanar CT image reconstructions and MIPs were obtained to evaluate the vascular anatomy. RADIATION DOSE REDUCTION: This exam was performed according to the departmental dose-optimization program which includes automated exposure control, adjustment of the mA and/or kV according to patient size and/or use of  iterative reconstruction technique. CONTRAST:  60mL OMNIPAQUE IOHEXOL 350 MG/ML SOLN COMPARISON:  12/28/2020, 04/12/2023 FINDINGS: Cardiovascular: This is a technically adequate evaluation of the pulmonary vasculature. No filling defects or pulmonary emboli. The heart is unremarkable without pericardial effusion. Normal caliber of the thoracic aorta. Atherosclerosis of the aorta and coronary vasculature. Mediastinum/Nodes: No enlarged mediastinal, hilar, or axillary lymph nodes. Thyroid gland, trachea, and esophagus demonstrate no significant  findings. Lungs/Pleura: Large left pleural effusion, volume estimated in excess of 1 L. dependent consolidation within the left lower lobe most consistent with atelectasis. There is a trace right pleural effusion. No airspace disease or pneumothorax. Bilateral bronchial wall thickening, greatest in the lower lobes. Upper Abdomen: No acute abnormality. Musculoskeletal: No acute or destructive bony abnormalities. Reconstructed images demonstrate no additional findings. Review of the MIP images confirms the above findings. IMPRESSION: 1. No evidence of pulmonary embolus. 2. Large left pleural effusion, volume estimated in excess of 1 L. Dependent left lower lobe atelectasis. 3. Trace right pleural effusion. 4. Bilateral bronchial wall thickening, greatest in the lower lobes, which may reflect bronchitis or reactive airway disease. No evidence of pneumonia. 5. Aortic Atherosclerosis (ICD10-I70.0). Coronary artery atherosclerosis. Electronically Signed   By: Sharlet Salina M.D.   On: 04/12/2023 23:46   DG Chest 2 View  Result Date: 04/12/2023 CLINICAL DATA:  Chest pain.  Hypertension. EXAM: CHEST - 2 VIEW COMPARISON:  10/06/2021 FINDINGS: Midline trachea. Mild cardiomegaly. Increase in small to moderate left pleural effusion. Biapical pleural thickening. No pneumothorax. No congestive failure. Worsened left lower lobe airspace disease. IMPRESSION: Increase in small to moderate left pleural effusion. Progressive adjacent left base airspace disease which could represent atelectasis or infection. Cardiomegaly without congestive failure. Electronically Signed   By: Jeronimo Greaves M.D.   On: 04/12/2023 16:58      Assessment/Plan 1. Emergent RIGHT groin exploration- repair of femoral/External Iliac Artery 2. Extensive discussion with patients daughter via telephone regarding critical and emergent status as the patient is intubated, hypotensive and critically ill. Explained risks benefits, alternatives- risk of injury  to surrounding structures, limb ischemia, MI, respiratory failure and death. Understanding expressed- consent obtained.    Bertram Denver, MD  04/13/2023 10:39 PM    This note was created with Dragon medical transcription system.  Any error is purely unintentional

## 2023-04-13 NOTE — ED Notes (Signed)
Dressing over thoracentesis site changed by ED tech at this time.

## 2023-04-13 NOTE — Progress Notes (Signed)
Dressing on patient's back changed

## 2023-04-13 NOTE — ED Notes (Signed)
Pt ambulated to restroom without distress.

## 2023-04-13 NOTE — Progress Notes (Signed)
       CROSS COVER NOTE  NAME: Dawn Bruce MRN: [id number] DOB : 1946/01/05    Concern as stated by nurse / staff   Message received from critical care NP [name] NP Hey not sure if someone contacted you about this pt came back from cath lab with hematoma from right groin s/p right heart cath by paraschos, they are holding pressure on the groin and [facility] vascular on the way. We will be taking over for now on Levo and Dopamine       Pertinent findings on chart review:   Assessment and  Interventions   Assessment:  Plan: [facility] to assume care after critical care needs are metX X

## 2023-04-13 NOTE — ED Notes (Signed)
Pt assisted to bathroom. Dressing on back changed.

## 2023-04-13 NOTE — Anesthesia Preprocedure Evaluation (Signed)
Anesthesia Evaluation  Patient identified by MRN, date of birth, ID band Patient unresponsive    Reviewed: Allergy & Precautions, H&P , Patient's Chart, lab work & pertinent test results, Unable to perform ROS - Chart review onlyPreop documentation limited or incomplete due to emergent nature of procedure.  Airway       Comment: ETT at 22cm Dental   Pulmonary former smoker recurrent pleural effusions (L>R) previously requiring thoracentesis    Pulmonary exam normal        Cardiovascular hypertension, + Past MI (04/12/2023: NSTEMI), + Cardiac Stents and + Peripheral Vascular Disease (2001: CAROTID ENDARTERECTOMY)  Normal cardiovascular exam Rhythm:Irregular Rate:Tachycardia - Systolic murmurs and - Peripheral Edema coronary artery disease s/p DES to LAD 02/2022, chronic systolic heart failure with recovered EF (previously EF 35-40% improved to > 55%), persistent atrial fibrillation on Eliquis s/p catheter ablation (03/2021)   Neuro/Psych negative neurological ROS  negative psych ROS   GI/Hepatic Neg liver ROS,GERD  ,,  Endo/Other  negative endocrine ROS    Renal/GU Renal InsufficiencyRenal disease     Musculoskeletal   Abdominal   Peds  Hematology negative hematology ROS (+)   Anesthesia Other Findings NSTEMI 04/13/23 LHC 11/22:24:   Mid LAD lesion is 30% stenosed.   Ost LAD to Prox LAD lesion is 99% stenosed.   A drug-eluting stent was successfully placed using a STENT ONYX FRONTIER 3.5X12.   Post intervention, there is a 0% residual stenosis.   There is mild left ventricular systolic dysfunction.   The left ventricular ejection fraction is 45-50% by visual estimate.   1.  High-grade in-stent restenosis ostial LAD 2.  Successful PCI with 3.5 x 12 mm Onyx frontier DES 3.  Right groin hematoma    S/P L thoracentesis 04/13/2023  Past Medical History: No date: Actinic keratosis No date: Chronic pain ?:  GERD (gastroesophageal reflux disease)     Comment:  Taking Pepcid 05/02/2021: History of basal cell carcinoma (BCC)     Comment:  right upper back paraspinal 09/10/2019: History of SCC (squamous cell carcinoma) of skin     Comment:  left dorsum wrist ED&C done 10/28/19 03/04/2020: History of SCC (squamous cell carcinoma) of skin     Comment:  right dorsum hand 03/04/2020: History of squamous cell carcinoma in situ (SCCIS)     Comment:  left dorsum wrist  ED&C 03/04/2020: History of squamous cell carcinoma in situ (SCCIS)     Comment:  right medial infraorbital  ED&C 04/28/2020 10/28/2019: History of squamous cell carcinoma in situ (SCCIS)     Comment:  right dorsum hand proximal lateral 10/28/2019: History of squamous cell carcinoma in situ (SCCIS)     Comment:  right dorsum hand proximal medial No date: Hyperlipidemia No date: Hypertension 04/12/2023: NSTEMI (non-ST elevated myocardial infarction) (HCC) No date: Renal disorder 10/31/2021: Squamous cell carcinoma in situ     Comment:  left distal  tricep, EDC 08/28/2022: Squamous cell carcinoma of skin     Comment:  Right volar forearm - EDC  Past Surgical History: No date: ABDOMINAL HYSTERECTOMY April 2023: APPENDECTOMY     Comment:  Burst appendix No date: AUGMENTATION MAMMAPLASTY; Bilateral     Comment:  25-30 years ago 12/01/2020: CARDIOVERSION; N/A     Comment:  Procedure: CARDIOVERSION;  Surgeon: Debbe Odea,               MD;  Location: [facility] ORS;  Service: Cardiovascular;  Laterality: N/A; 1995(approximate): CAROTID ARTERY ANGIOPLASTY; Right 2001: CAROTID ENDARTERECTOMY No date: COSMETIC SURGERY No date: ELBOW SURGERY; Left 2016: FRACTURE SURGERY     Comment:  Arm 12/27/2020: THORACENTESIS; N/A     Comment:  Procedure: THORACENTESIS;  Surgeon: Josephine Igo,               DO;  Location: [facility];  Service: Pulmonary;                Laterality: N/A; No date: TONSILLECTOMY ?: TUBAL  LIGATION  BMI    Body Mass Index: 22.67 kg/m      Reproductive/Obstetrics negative OB ROS                             Anesthesia Physical Anesthesia Plan  ASA: 5 and emergent  Anesthesia Plan: General ETT   Post-op Pain Management: Minimal or no pain anticipated   Induction: Intravenous  PONV Risk Score and Plan: 2 and Ondansetron and Dexamethasone  Airway Management Planned: Oral ETT  Additional Equipment: Arterial line  Intra-op Plan:   Post-operative Plan: Post-operative intubation/ventilation  Informed Consent: I have reviewed the patients History and Physical, chart, labs and discussed the procedure including the risks, benefits and alternatives for the proposed anesthesia with the patient or authorized representative who has indicated his/her understanding and acceptance.     Dental Advisory Given and Consent reviewed with POA  Plan Discussed with: CRNA and Surgeon  Anesthesia Plan Comments:         Anesthesia Quick Evaluation

## 2023-04-13 NOTE — Progress Notes (Signed)
PHARMACY - ANTICOAGULATION CONSULT NOTE  Pharmacy Consult for Heparin  Indication: chest pain/ACS  No Known Allergies  Patient Measurements: Height: 5\' 3"  (160 cm) Weight: 61.2 kg (135 lb) IBW/kg (Calculated) : 52.4 Heparin Dosing Weight: 61.2 kg   Vital Signs: Temp: 98.1 F (36.7 C) (11/21 2240) Temp Source: Oral (11/21 2240) BP: 136/111 (11/22 0121) Pulse Rate: 97 (11/22 0121)  Labs: Recent Labs    04/12/23 1522 04/12/23 1739 04/13/23 0147  HGB 14.0  --   --   HCT 42.4  --   --   PLT 143*  --   --   CREATININE 1.29*  --   --   TROPONINIHS 27* 609* 979*    Estimated Creatinine Clearance: 29.3 mL/min (A) (by C-G formula based on SCr of 1.29 mg/dL (H)).   Medical History: Past Medical History:  Diagnosis Date   Actinic keratosis    Chronic pain    GERD (gastroesophageal reflux disease) ?   Taking Pepcid   History of basal cell carcinoma (BCC) 05/02/2021   right upper back paraspinal   History of SCC (squamous cell carcinoma) of skin 09/10/2019   left dorsum wrist ED&C done 10/28/19   History of SCC (squamous cell carcinoma) of skin 03/04/2020   right dorsum hand   History of squamous cell carcinoma in situ (SCCIS) 03/04/2020   left dorsum wrist  ED&C   History of squamous cell carcinoma in situ (SCCIS) 03/04/2020   right medial infraorbital  ED&C 04/28/2020   History of squamous cell carcinoma in situ (SCCIS) 10/28/2019   right dorsum hand proximal lateral   History of squamous cell carcinoma in situ (SCCIS) 10/28/2019   right dorsum hand proximal medial   Hyperlipidemia    Hypertension    NSTEMI (non-ST elevated myocardial infarction) (HCC) 04/12/2023   Renal disorder    Squamous cell carcinoma in situ 10/31/2021   left distal  tricep, EDC   Squamous cell carcinoma of skin 08/28/2022   Right volar forearm - EDC    Medications:  (Not in a hospital admission)   Assessment: Pharmacy consulted to dose heparin in this 77 year old female admitted with  ACS/NSTEMI.  Pt was on Eliquis 2.5 mg PO BID PTA, last dose on 11/21 AM. CrCl = 29.3 ml/min   Goal of Therapy:  Heparin level 0.3-0.7 units/ml aPTT 66 - 102 seconds Monitor platelets by anticoagulation protocol: Yes   Plan:  Give 3700 units bolus x 1 Start heparin infusion at 750 units/hr Continue to monitor H&H and platelets - Will use aPTT to guide dosing until correlating with HL  - Will draw aPTT 8 hrs after start of drip - Will check HL on 11/23 with AM labs   Valine Drozdowski D 04/13/2023,4:12 AM

## 2023-04-13 NOTE — ED Notes (Signed)
Lab called by this RN for blood draw- state they will send someone to draw APTT.

## 2023-04-13 NOTE — ED Notes (Signed)
Supply called to deliver channel for NACL

## 2023-04-13 NOTE — Assessment & Plan Note (Addendum)
Initially thought to be due to NSTEMI. Troponin reordered to follow trend.  CTA of the chest ordered is negative for pulmonary embolism but does show a large left pleural effusion and will defer to a.m. team to coordinate with IR for thoracentesis.

## 2023-04-13 NOTE — Procedures (Cosign Needed Addendum)
Intubation Procedure Note  Dawn Bruce  [id number]  [birth year]  Date:04/13/23  Time:11:20 PM   Provider Performing:Dawn Bruce   Procedure: Intubation (31500)  Indication(s) Respiratory Failure in the setting of Acute Blood Loss Anemia with Hemorrhagic shock   Consent Unable to obtain consent due to emergent nature of procedure.  Anesthesia Etomidate, Fentanyl, and Rocuronium  Time Out Verified patient identification, verified procedure, site/side was marked, verified correct patient position, special equipment/implants available, medications/allergies/relevant history reviewed, required imaging and test results available.  Sterile Technique Usual hand hygeine, masks, and gloves were used  Procedure Description Patient positioned in bed supine.  Sedation given as noted above.  Patient was intubated with endotracheal tube using Glidescope.  View was Grade 1 full glottis .  Number of attempts was 1.  Colorimetric CO2 detector was consistent with tracheal placement.  Complications/Tolerance None; patient tolerated the procedure well. Chest X-ray is ordered to verify placement.  EBL Minimal  Specimen(s) None   [name], DNP, CCRN, FNP-C, AGACNP-BC Acute Care & Family Nurse Practitioner  [facility]  See Amion for personal pager PCCM on call pager [phone] until 7 am

## 2023-04-13 NOTE — ED Notes (Signed)
ED tech attempted to draw blood w/ out success- lab called for APTT draw.

## 2023-04-13 NOTE — Hospital Course (Addendum)
HPI: Dawn Bruce is a 77 y.o. female with a past medical history of hypertension and CAD status post 2 stents who presents complaining of chest pain and high blood pressure from home.  Patient states that she was sitting on the couch today when she experienced substernal chest pressure that did not radiate and was not associated with any movement.  Patient states that when she trended her blood pressure it continued to rise. Has taken all Rx as usual. Pain lasted approximately 4 hours before resolving spontaneously as patient's blood pressure decreased.   Hospital course / significant events:  11/21: Troponin 27 @15 :30 --> 609 @17 :30, (+)D-Dimer 0.79, EKG nonacute, CXR increase L pleural effusion, admitted to hospitalist service and started heparin infusion, cardiology to consult. CTA no PE but (+)pleural effusion L, bronchitis/RAD but no discrete pneumonia.   11/22: Troponin to 1954 @04 :15. Thoracentesis done. LHC pending.   Of note, last Echo 09/2020 EF 50-55, G2 diast df, mild dilation LA, mod L pleural effusion at that time as well    Consultants:  Cardiology  Interventional Radiology   Procedures/Surgeries: 04/13/23 thoracentesis  04/13/23 LHC       ASSESSMENT & PLAN:   Chest pain D/t NSTEMI. Ddx includes Pleural Effusion but significant elevation in troponin makes NSTEMI more likely, also HTN urgency likely a component  Treat as below  NSTEMI CAD  HTN HLD Heparin infusion  holding home eliquis ??? Home plavix, not on home ASA ASA, statin  BP control - continue home BB, ARB Cardiology consult - [facility] Trend troponin  Echo LHC today   Large L Pleural Effusion - chronic but worsening  Thoracentesis - fluid analysis pending but expect will be c/w CHF   Echo pending   Chronic Diastolic Heart Failure / HFpEF  Likely contributing to pleural effusion, NSTEMI Cardiology following  Treat complications as above  Continue home BB, ARB, ASA Not on diuresis at home, diuresis per  cardiology Strict I&O, daily weights   CKD3a Stable Follow BMP      Normal weight based on BMI: Body mass index is 23.91 kg/m.  Underweight - under 18.5  normal weight - 18.5 to 24.9 overweight - 25 to 29.9 obese - 30 or more   DVT prophylaxis: on heparin gtt for NSTEMI IV fluids: no continuous IV fluids  Nutrition: NPO pending cath  Central lines / invasive devices: none  Code Status: FULL CODE ACP documentation reviewed: 04/13/23 and has living will and HCPOA on file in [facility]  TOC needs: none at this time Barriers to dispo / significant pending items: clinical conditions above, pending LHC and thoracentesis, cardiology clearance prior to dc expect will be here couple more day s

## 2023-04-13 NOTE — ED Notes (Addendum)
The pt was lying supine in the bed with her head elevated. The pt's granddaughter in the room advised the pt appeared to be sob when she assisted her to the restroom. The pt advised she was having some chest discomfort as well some sob. The pt was warm, pink, and dry. The pt was alert and oriented x 4.

## 2023-04-13 NOTE — Progress Notes (Signed)
PROGRESS NOTE    Dawn Bruce   BMW:[id number] DOB: [birth year]  DOA: 04/12/2023 Date of Service: 04/13/23 which is hospital day 1  PCP: Erasmo Downer, MD    HPI: Dawn Bruce is a 77 y.o. female with a past medical history of hypertension and CAD status post 2 stents who presents complaining of chest pain and high blood pressure from home.  Patient states that she was sitting on the couch today when she experienced substernal chest pressure that did not radiate and was not associated with any movement.  Patient states that when she trended her blood pressure it continued to rise. Has taken all Rx as usual. Pain lasted approximately 4 hours before resolving spontaneously as patient's blood pressure decreased.   Hospital course / significant events:  11/21: Troponin 27 @15 :30 --> 609 @17 :30, (+)D-Dimer 0.79, EKG nonacute, CXR increase L pleural effusion, admitted to hospitalist service and started heparin infusion, cardiology to consult. CTA no PE but (+)pleural effusion L, bronchitis/RAD but no discrete pneumonia.   11/22: Troponin to 1954 @04 :15. Thoracentesis done. LHC pending.   Of note, last Echo 09/2020 EF 50-55, G2 diast df, mild dilation LA, mod L pleural effusion at that time as well    Consultants:  Cardiology  Interventional Radiology   Procedures/Surgeries: 04/13/23 thoracentesis  04/13/23 LHC       ASSESSMENT & PLAN:   Chest pain D/t NSTEMI. Ddx includes Pleural Effusion but significant elevation in troponin makes NSTEMI more likely, also HTN urgency likely a component  Treat as below  NSTEMI CAD  HTN HLD Heparin infusion  holding home eliquis ??? Home plavix, not on home ASA ASA, statin  BP control - continue home BB, ARB Cardiology consult - KC Trend troponin  Echo LHC today   Large L Pleural Effusion - chronic but worsening  Thoracentesis - fluid analysis pending but expect will be c/w CHF   Echo pending   Chronic Diastolic Heart Failure /  HFpEF  Likely contributing to pleural effusion, NSTEMI Cardiology following  Treat complications as above  Continue home BB, ARB, ASA Not on diuresis at home, diuresis per cardiology Strict I&O, daily weights   CKD3a Stable Follow BMP      Normal weight based on BMI: Body mass index is 23.91 kg/m.  Underweight - under 18.5  normal weight - 18.5 to 24.9 overweight - 25 to 29.9 obese - 30 or more   DVT prophylaxis: on heparin gtt for NSTEMI IV fluids: no continuous IV fluids  Nutrition: NPO pending cath  Central lines / invasive devices: none  Code Status: FULL CODE ACP documentation reviewed: 04/13/23 and has living will and HCPOA on file in [facility]  TOC needs: none at this time Barriers to dispo / significant pending items: clinical conditions above, pending LHC and thoracentesis, cardiology clearance prior to dc expect will be here couple more day s             Subjective / Brief ROS:  Patient reports no concerns this morning  Denies CP/SOB.  Pain controlled.  Denies new weakness.  Reports no concerns w/ urination/defecation.   Family Communication: family at bedside on rounds     Objective Findings:  Vitals:   04/13/23 1136 04/13/23 1151 04/13/23 1200 04/13/23 1230  BP: (!) 143/83 (!) 143/83 112/75 (!) 141/73  Pulse: 99 (!) 111 89 87  Resp: 17 18    Temp:      TempSrc:      SpO2:  92% 99% 100%  Weight:      Height:       No intake or output data in the 24 hours ending 04/13/23 1310 Filed Weights   04/12/23 1518  Weight: 61.2 kg    Examination:  Physical Exam Constitutional:      General: She is not in acute distress. Cardiovascular:     Rate and Rhythm: Normal rate and regular rhythm.  Pulmonary:     Effort: Pulmonary effort is normal. No tachypnea.     Breath sounds: Examination of the left-middle field reveals decreased breath sounds. Examination of the left-lower field reveals decreased breath sounds. Decreased breath sounds  present.  Musculoskeletal:     Right lower leg: No edema.     Left lower leg: No edema.  Skin:    General: Skin is warm and dry.  Neurological:     General: No focal deficit present.     Mental Status: She is alert and oriented to person, place, and time.  Psychiatric:        Mood and Affect: Mood normal.        Behavior: Behavior normal.          Scheduled Medications:   aspirin EC  81 mg Oral Daily   atorvastatin  20 mg Oral Daily   carvedilol  6.25 mg Oral BID WC   clopidogrel  75 mg Oral Daily   ezetimibe  10 mg Oral Daily   [START ON 04/15/2023] fentaNYL  2 patch Transdermal Q72H   irbesartan  75 mg Oral Daily   melatonin  5 mg Oral QHS   ondansetron (ZOFRAN) IV  4 mg Intravenous Once   pregabalin  150 mg Oral BID    Continuous Infusions:  sodium chloride 20 mL/hr at 04/13/23 1257   heparin 750 Units/hr (04/13/23 0452)    PRN Medications:  acetaminophen, hydrALAZINE, nitroGLYCERIN, ondansetron (ZOFRAN) IV  Antimicrobials from admission:  Anti-infectives (From admission, onward)    None           Data Reviewed:  I have personally reviewed the following...  CBC: Recent Labs  Lab 04/12/23 1522 04/13/23 0415  WBC 6.3 9.5  NEUTROABS  --  8.1*  HGB 14.0 14.3  HCT 42.4 43.6  MCV 86.9 86.9  PLT 143* 192   Basic Metabolic Panel: Recent Labs  Lab 04/12/23 1522 04/13/23 0415  NA 141 142  K 3.4* 3.9  CL 108 107  CO2 23 21*  GLUCOSE 187* 124*  BUN 24* 24*  CREATININE 1.29* 1.24*  CALCIUM 8.6* 9.3  MG  --  2.0  PHOS  --  3.9   GFR: Estimated Creatinine Clearance: 30.4 mL/min (A) (by C-G formula based on SCr of 1.24 mg/dL (H)). Liver Function Tests: Recent Labs  Lab 04/12/23 1739 04/13/23 0415  AST 34 38  ALT 21 21  ALKPHOS 76 86  BILITOT 0.9 1.0  PROT 7.1 6.9  ALBUMIN 3.8 3.6   No results for input(s): "LIPASE", "AMYLASE" in the last 168 hours. No results for input(s): "AMMONIA" in the last 168 hours. Coagulation  Profile: Recent Labs  Lab 04/13/23 0415  INR 1.1   Cardiac Enzymes: No results for input(s): "CKTOTAL", "CKMB", "CKMBINDEX", "TROPONINI" in the last 168 hours. BNP (last 3 results) No results for input(s): "PROBNP" in the last 8760 hours. HbA1C: No results for input(s): "HGBA1C" in the last 72 hours. CBG: No results for input(s): "GLUCAP" in the last 168 hours. Lipid Profile: Recent Labs    04/12/23 1739  CHOL  151  HDL 61  LDLCALC 67  TRIG 115  CHOLHDL 2.5   Thyroid Function Tests: Recent Labs    04/12/23 1739  TSH 3.374  FREET4 0.81   Anemia Panel: No results for input(s): "VITAMINB12", "FOLATE", "FERRITIN", "TIBC", "IRON", "RETICCTPCT" in the last 72 hours. Most Recent Urinalysis On File:     Component Value Date/Time   COLORURINE COLORLESS (A) 01/20/2022 1331   APPEARANCEUR CLEAR (A) 01/20/2022 1331   LABSPEC 1.003 (L) 01/20/2022 1331   PHURINE 6.0 01/20/2022 1331   GLUCOSEU 150 (A) 01/20/2022 1331   HGBUR MODERATE (A) 01/20/2022 1331   BILIRUBINUR NEGATIVE 01/20/2022 1331   KETONESUR NEGATIVE 01/20/2022 1331   PROTEINUR NEGATIVE 01/20/2022 1331   NITRITE NEGATIVE 01/20/2022 1331   LEUKOCYTESUR NEGATIVE 01/20/2022 1331   Sepsis Labs: @LABRCNTIP (procalcitonin:4,lacticidven:4) Microbiology: No results found for this or any previous visit (from the past 240 hour(s)).    Radiology Studies last 3 days: DG Chest Port 1 View  Result Date: 04/13/2023 CLINICAL DATA:  [id number] Pleural effusion on left [id number]. Status post thoracentesis. EXAM: PORTABLE CHEST 1 VIEW COMPARISON:  04/12/2023. FINDINGS: There is small left pleural effusion with probable associated compressive atelectatic changes in the left lung. There is interval improvement when compared to the prior exam, status post left thoracentesis. No left pneumothorax. Biapical pleural thickening/calcifications again seen. Bilateral lung fields are otherwise clear. There is subtle blunting of right lateral  costophrenic angle, which may represent trace right pleural effusion. Normal cardio-mediastinal silhouette. No acute osseous abnormalities. The soft tissues are within normal limits. IMPRESSION: *Small left pleural effusion, improved since the prior study, status post left thoracentesis. No pneumothorax. Electronically Signed   By: Jules Schick M.D.   On: 04/13/2023 12:58   CT Angio Chest Pulmonary Embolism (PE) W or WO Contrast  Result Date: 04/12/2023 CLINICAL DATA:  Chest pain for 1 hour, hypertension EXAM: CT ANGIOGRAPHY CHEST WITH CONTRAST TECHNIQUE: Multidetector CT imaging of the chest was performed using the standard protocol during bolus administration of intravenous contrast. Multiplanar CT image reconstructions and MIPs were obtained to evaluate the vascular anatomy. RADIATION DOSE REDUCTION: This exam was performed according to the departmental dose-optimization program which includes automated exposure control, adjustment of the mA and/or kV according to patient size and/or use of iterative reconstruction technique. CONTRAST:  60mL OMNIPAQUE IOHEXOL 350 MG/ML SOLN COMPARISON:  12/28/2020, 04/12/2023 FINDINGS: Cardiovascular: This is a technically adequate evaluation of the pulmonary vasculature. No filling defects or pulmonary emboli. The heart is unremarkable without pericardial effusion. Normal caliber of the thoracic aorta. Atherosclerosis of the aorta and coronary vasculature. Mediastinum/Nodes: No enlarged mediastinal, hilar, or axillary lymph nodes. Thyroid gland, trachea, and esophagus demonstrate no significant findings. Lungs/Pleura: Large left pleural effusion, volume estimated in excess of 1 L. dependent consolidation within the left lower lobe most consistent with atelectasis. There is a trace right pleural effusion. No airspace disease or pneumothorax. Bilateral bronchial wall thickening, greatest in the lower lobes. Upper Abdomen: No acute abnormality. Musculoskeletal: No acute or  destructive bony abnormalities. Reconstructed images demonstrate no additional findings. Review of the MIP images confirms the above findings. IMPRESSION: 1. No evidence of pulmonary embolus. 2. Large left pleural effusion, volume estimated in excess of 1 L. Dependent left lower lobe atelectasis. 3. Trace right pleural effusion. 4. Bilateral bronchial wall thickening, greatest in the lower lobes, which may reflect bronchitis or reactive airway disease. No evidence of pneumonia. 5. Aortic Atherosclerosis (ICD10-I70.0). Coronary artery atherosclerosis. Electronically Signed   By: Maxwell Caul.D.  On: 04/12/2023 23:46   DG Chest 2 View  Result Date: 04/12/2023 CLINICAL DATA:  Chest pain.  Hypertension. EXAM: CHEST - 2 VIEW COMPARISON:  10/06/2021 FINDINGS: Midline trachea. Mild cardiomegaly. Increase in small to moderate left pleural effusion. Biapical pleural thickening. No pneumothorax. No congestive failure. Worsened left lower lobe airspace disease. IMPRESSION: Increase in small to moderate left pleural effusion. Progressive adjacent left base airspace disease which could represent atelectasis or infection. Cardiomegaly without congestive failure. Electronically Signed   By: Jeronimo Greaves M.D.   On: 04/12/2023 16:58        Sunnie Nielsen, DO [facility] 04/13/2023, 1:10 PM    Dictation software may have been used to generate the above note. Typos may occur and escape review in typed/dictated notes. Please contact Dr Lyn Hollingshead directly for clarity if needed.  Staff may message me via secure chat in Epic  but this may not receive an immediate response,  please page me for urgent matters!  If 7PM-7AM, please contact night coverage [email or web address]

## 2023-04-13 NOTE — Consult Note (Signed)
[facility] CARDIOLOGY CONSULT NOTE       Patient ID: Dawn Bruce MRN: [id number] DOB/AGE: 79-30-1947 79 y.o.  Admit date: 04/12/2023 Referring Physician Dr. Sunnie Nielsen Primary Physician Bacigalupo, Marzella Schlein, MD  Primary Cardiologist Dr. Gilman Buttner ([facility]) Reason for Consultation NSTEMI  HPI: Dawn Bruce is a 77 y.o. female  with a past medical history of chronic systolic heart failure with recovered EF (previously EF 35-40% improved to > 55%), persistent atrial fibrillation on Eliquis s/p catheter ablation (03/2021), HTN, HLD, previous tobacco use, recurrent pleural effusions (L>R) previously requiring thoracentesis, skin cancer, carotid disease s/p R CEA, CKD stage 3 who presented to the ED on 04/12/2023 for chest pain. Cardiology was consulted for further evaluation.   Patient reports that yesterday morning while she was laying her couch she began experiencing chest pain.  Reports she also noticed that her blood pressure was elevated at home.  Unsure if this has been elevated for a few days because she only checked it yesterday.  She describes the pain as a central aching that radiated through to her back.  Also reports this felt like pressure.  States that she at times has also felt her heart racing.  Decided to come to the ED for further evaluation given her persistent symptoms.  Workup in the ED notable for creatinine 1.29, potassium 3.4, hemoglobin 14.0, WBC 6.3.  BNP mildly elevated at 270.  Troponins trended 27  > 609 > 979 > 1954.  EKG demonstrated normal sinus rhythm rate 81 bpm, nonacute.  CXR yesterday with left pleural effusion, no evidence of pulmonary vascular congestion.  CTA chest yesterday negative for acute PE.  Underwent thoracentesis today of L pleural effusion with a yield of 650 mL.  At the time of my evaluation this morning patient is resting in ED stretcher with family present at bedside.  Discussed her episodes of chest pain that began yesterday.  Reports  it feels similar to prior episodes she has had whenever she needed stenting.  She denies any shortness of breath, dizziness, syncope.  States that chest pain is still present but not as significant as yesterday.    Review of systems complete and found to be negative unless listed above    Past Medical History:  Diagnosis Date   Actinic keratosis    Chronic pain    GERD (gastroesophageal reflux disease) ?   Taking Pepcid   History of basal cell carcinoma (BCC) 05/02/2021   right upper back paraspinal   History of SCC (squamous cell carcinoma) of skin 09/10/2019   left dorsum wrist ED&C done 10/28/19   History of SCC (squamous cell carcinoma) of skin 03/04/2020   right dorsum hand   History of squamous cell carcinoma in situ (SCCIS) 03/04/2020   left dorsum wrist  ED&C   History of squamous cell carcinoma in situ (SCCIS) 03/04/2020   right medial infraorbital  ED&C 04/28/2020   History of squamous cell carcinoma in situ (SCCIS) 10/28/2019   right dorsum hand proximal lateral   History of squamous cell carcinoma in situ (SCCIS) 10/28/2019   right dorsum hand proximal medial   Hyperlipidemia    Hypertension    NSTEMI (non-ST elevated myocardial infarction) (HCC) 04/12/2023   Renal disorder    Squamous cell carcinoma in situ 10/31/2021   left distal  tricep, EDC   Squamous cell carcinoma of skin 08/28/2022   Right volar forearm - EDC    Past Surgical History:  Procedure Laterality Date   ABDOMINAL HYSTERECTOMY  APPENDECTOMY  April 2023   Burst appendix   AUGMENTATION MAMMAPLASTY Bilateral    25-30 years ago   CARDIOVERSION N/A 12/01/2020   Procedure: CARDIOVERSION;  Surgeon: Debbe Odea, MD;  Location: [facility] ORS;  Service: Cardiovascular;  Laterality: N/A;   CAROTID ARTERY ANGIOPLASTY Right 1995(approximate)   CAROTID ENDARTERECTOMY  2001   COSMETIC SURGERY     ELBOW SURGERY Left    FRACTURE SURGERY  2016   Arm   THORACENTESIS N/A 12/27/2020   Procedure:  THORACENTESIS;  Surgeon: Josephine Igo, DO;  Location: [facility];  Service: Pulmonary;  Laterality: N/A;   TONSILLECTOMY     TUBAL LIGATION  ?    (Not in a hospital admission)  Social History   Socioeconomic History   Marital status: Married    Spouse name: Not on file   Number of children: 3   Years of education: Not on file   Highest education level: Some college, no degree  Occupational History   Occupation: retired  Tobacco Use   Smoking status: Former    Current packs/day: 0.00    Average packs/day: 0.3 packs/day for 15.0 years (3.8 ttl pk-yrs)    Types: Cigarettes    Start date: 56    Quit date: 1990    Years since quitting: 34.9    Passive exposure: Past   Smokeless tobacco: Never   Tobacco comments:    quit around 1990-1995 (?)  Vaping Use   Vaping status: Never Used  Substance and Sexual Activity   Alcohol use: Yes    Alcohol/week: 14.0 standard drinks of alcohol    Types: 14 Standard drinks or equivalent per week    Comment: 2 scotch shots   Drug use: Never   Sexual activity: Not Currently    Birth control/protection: None  Other Topics Concern   Not on file  Social History Narrative   Not on file   Social Determinants of Health   Financial Resource Strain: Low Risk  (10/13/2022)   Overall Financial Resource Strain (CARDIA)    Difficulty of Paying Living Expenses: Not hard at all  Food Insecurity: No Food Insecurity (04/13/2023)   Hunger Vital Sign    Worried About Running Out of Food in the Last Year: Never true    Ran Out of Food in the Last Year: Never true  Transportation Needs: No Transportation Needs (04/13/2023)   PRAPARE - Administrator, Civil Service (Medical): No    Lack of Transportation (Non-Medical): No  Physical Activity: Insufficiently Active (10/13/2022)   Exercise Vital Sign    Days of Exercise per Week: 5 days    Minutes of Exercise per Session: 20 min  Stress: No Stress Concern Present (10/13/2022)   [employer] of Occupational Health - Occupational Stress Questionnaire    Feeling of Stress : Not at all  Social Connections: Unknown (10/13/2022)   Social Connection and Isolation Panel [NHANES]    Frequency of Communication with Friends and Family: More than three times a week    Frequency of Social Gatherings with Friends and Family: Once a week    Attends Religious Services: Not on Insurance claims handler of Clubs or Organizations: No    Attends Banker Meetings: Never    Marital Status: Married  Catering manager Violence: Not At Risk (04/13/2023)   Humiliation, Afraid, Rape, and Kick questionnaire    Fear of Current or Ex-Partner: No    Emotionally Abused: No    Physically  Abused: No    Sexually Abused: No    Family History  Problem Relation Age of Onset   Cancer Mother    Cancer Father    Cancer Sister    Breast cancer Neg Hx      Vitals:   04/13/23 1359 04/13/23 1400 04/13/23 1430 04/13/23 1500  BP:  126/72 (!) 145/78   Pulse:  84 (!) 104 91  Resp:  16 18   Temp: 98.4 F (36.9 C)     TempSrc: Oral     SpO2:  100% 100% 95%  Weight:      Height:        PHYSICAL EXAM General: Well appearing female, well nourished, in no acute distress sitting upright in hospital bed with family present at bedside. HEENT: Normocephalic and atraumatic. Neck: No JVD.  Lungs: Normal respiratory effort on room air. Clear bilaterally to auscultation. No wheezes, crackles, rhonchi.  Heart: HRRR. Normal S1 and S2 without gallops or murmurs.  Abdomen: Non-distended appearing.  Msk: Normal strength and tone for age. Extremities: Warm and well perfused. No clubbing, cyanosis. No edema.  Neuro: Alert and oriented X 3. Psych: Answers questions appropriately.   Labs: Basic Metabolic Panel: Recent Labs    04/12/23 1522 04/13/23 0415  NA 141 142  K 3.4* 3.9  CL 108 107  CO2 23 21*  GLUCOSE 187* 124*  BUN 24* 24*  CREATININE 1.29* 1.24*  CALCIUM 8.6* 9.3  MG  --  2.0  PHOS   --  3.9   Liver Function Tests: Recent Labs    04/12/23 1739 04/13/23 0415  AST 34 38  ALT 21 21  ALKPHOS 76 86  BILITOT 0.9 1.0  PROT 7.1 6.9  ALBUMIN 3.8 3.6   No results for input(s): "LIPASE", "AMYLASE" in the last 72 hours. CBC: Recent Labs    04/12/23 1522 04/13/23 0415  WBC 6.3 9.5  NEUTROABS  --  8.1*  HGB 14.0 14.3  HCT 42.4 43.6  MCV 86.9 86.9  PLT 143* 192   Cardiac Enzymes: Recent Labs    04/12/23 1739 04/13/23 0147 04/13/23 0414  TROPONINIHS 609* 979* 1,954*   BNP: Recent Labs    04/12/23 1522  BNP 270.4*   D-Dimer: Recent Labs    04/12/23 2147  DDIMER 0.79*   Hemoglobin A1C: No results for input(s): "HGBA1C" in the last 72 hours. Fasting Lipid Panel: Recent Labs    04/12/23 1739  CHOL 151  HDL 61  LDLCALC 67  TRIG 115  CHOLHDL 2.5   Thyroid Function Tests: Recent Labs    04/12/23 1739  TSH 3.374   Anemia Panel: No results for input(s): "VITAMINB12", "FOLATE", "FERRITIN", "TIBC", "IRON", "RETICCTPCT" in the last 72 hours.   Radiology: US THORACENTESIS ASP PLEURAL SPACE W/IMG GUIDE  Result Date: 04/13/2023 INDICATION: Patient with history of CAD s/p stenting, admitted with complaints of new chest pain and found to have large left pleural effusion. Request for diagnostic and therapeutic left thoracentesis. EXAM: ULTRASOUND GUIDED LEFT THORACENTESIS MEDICATIONS: 15 mL 1% lidocaine COMPLICATIONS: None immediate. PROCEDURE: An ultrasound guided thoracentesis was thoroughly discussed with the patient and questions answered. The benefits, risks, alternatives and complications were also discussed. The patient understands and wishes to proceed with the procedure. Written consent was obtained. Ultrasound was performed to localize and mark an adequate pocket of fluid in the left chest. The area was then prepped and draped in the normal sterile fashion. 1% Lidocaine was used for local anesthesia. Under ultrasound guidance a  6 Fr Safe-T-Centesis  catheter was introduced. Thoracentesis was performed. The catheter was removed and a dressing applied. FINDINGS: A total of approximately 650 mL of clear yellow fluid was removed. Samples were sent to the laboratory as requested by the clinical team. IMPRESSION: Successful ultrasound guided left thoracentesis yielding 650 mL of pleural fluid. Performed by Lynnette Caffey, PA-C Electronically Signed   By: Olive Bass M.D.   On: 04/13/2023 14:13   DG Chest Port 1 View  Result Date: 04/13/2023 CLINICAL DATA:  [id number] Pleural effusion on left [id number]. Status post thoracentesis. EXAM: PORTABLE CHEST 1 VIEW COMPARISON:  04/12/2023. FINDINGS: There is small left pleural effusion with probable associated compressive atelectatic changes in the left lung. There is interval improvement when compared to the prior exam, status post left thoracentesis. No left pneumothorax. Biapical pleural thickening/calcifications again seen. Bilateral lung fields are otherwise clear. There is subtle blunting of right lateral costophrenic angle, which may represent trace right pleural effusion. Normal cardio-mediastinal silhouette. No acute osseous abnormalities. The soft tissues are within normal limits. IMPRESSION: *Small left pleural effusion, improved since the prior study, status post left thoracentesis. No pneumothorax. Electronically Signed   By: Jules Schick M.D.   On: 04/13/2023 12:58   CT Angio Chest Pulmonary Embolism (PE) W or WO Contrast  Result Date: 04/12/2023 CLINICAL DATA:  Chest pain for 1 hour, hypertension EXAM: CT ANGIOGRAPHY CHEST WITH CONTRAST TECHNIQUE: Multidetector CT imaging of the chest was performed using the standard protocol during bolus administration of intravenous contrast. Multiplanar CT image reconstructions and MIPs were obtained to evaluate the vascular anatomy. RADIATION DOSE REDUCTION: This exam was performed according to the departmental dose-optimization program which includes automated  exposure control, adjustment of the mA and/or kV according to patient size and/or use of iterative reconstruction technique. CONTRAST:  60mL OMNIPAQUE IOHEXOL 350 MG/ML SOLN COMPARISON:  12/28/2020, 04/12/2023 FINDINGS: Cardiovascular: This is a technically adequate evaluation of the pulmonary vasculature. No filling defects or pulmonary emboli. The heart is unremarkable without pericardial effusion. Normal caliber of the thoracic aorta. Atherosclerosis of the aorta and coronary vasculature. Mediastinum/Nodes: No enlarged mediastinal, hilar, or axillary lymph nodes. Thyroid gland, trachea, and esophagus demonstrate no significant findings. Lungs/Pleura: Large left pleural effusion, volume estimated in excess of 1 L. dependent consolidation within the left lower lobe most consistent with atelectasis. There is a trace right pleural effusion. No airspace disease or pneumothorax. Bilateral bronchial wall thickening, greatest in the lower lobes. Upper Abdomen: No acute abnormality. Musculoskeletal: No acute or destructive bony abnormalities. Reconstructed images demonstrate no additional findings. Review of the MIP images confirms the above findings. IMPRESSION: 1. No evidence of pulmonary embolus. 2. Large left pleural effusion, volume estimated in excess of 1 L. Dependent left lower lobe atelectasis. 3. Trace right pleural effusion. 4. Bilateral bronchial wall thickening, greatest in the lower lobes, which may reflect bronchitis or reactive airway disease. No evidence of pneumonia. 5. Aortic Atherosclerosis (ICD10-I70.0). Coronary artery atherosclerosis. Electronically Signed   By: Sharlet Salina M.D.   On: 04/12/2023 23:46   DG Chest 2 View  Result Date: 04/12/2023 CLINICAL DATA:  Chest pain.  Hypertension. EXAM: CHEST - 2 VIEW COMPARISON:  10/06/2021 FINDINGS: Midline trachea. Mild cardiomegaly. Increase in small to moderate left pleural effusion. Biapical pleural thickening. No pneumothorax. No congestive  failure. Worsened left lower lobe airspace disease. IMPRESSION: Increase in small to moderate left pleural effusion. Progressive adjacent left base airspace disease which could represent atelectasis or infection. Cardiomegaly without congestive failure. Electronically Signed  By: Jeronimo Greaves M.D.   On: 04/12/2023 16:58    ECHO pending  TELEMETRY reviewed by me 04/13/2023: sinus rhythm rate 90s  EKG reviewed by me: sinus rhythm rate 81 bpm  Data reviewed by me 04/13/2023: last 24h vitals tele labs imaging I/O ED provider note, admission H&P  Principal Problem:   Chest pain Active Problems:   CKD (chronic kidney disease) stage 3, GFR 30-59 ml/min (HCC)   Essential hypertension   Chronic diastolic heart failure (HCC)   NSTEMI (non-ST elevated myocardial infarction) (HCC)   Tobacco abuse    ASSESSMENT AND PLAN:  Dawn Bruce is a 77 y.o. female  with a past medical history of coronary artery disease s/p DES to LAD 02/2022, chronic systolic heart failure with recovered EF (previously EF 35-40% improved to > 55%), persistent atrial fibrillation on Eliquis s/p catheter ablation (03/2021), HTN, HLD, previous tobacco use, recurrent pleural effusions (L>R) previously requiring thoracentesis, skin cancer, carotid disease s/p R CEA, CKD stage 3 who presented to the ED on 04/12/2023 for chest pain. Cardiology was consulted for further evaluation.   # NSTEMI # Coronary artery disease s/p DES to LAD 02/2022 # Chronic HFrEF, recovered Patient reports onset of chest pain yesterday morning while at home.  Blood pressure also elevated at that time.  Episode similar to priors when she required stenting.  Troponins trended 27  > 609 > 979 > 1954.  EKG with normal sinus rhythm, nonacute. -Echo pending -Heparin gtt -Continue aspirin 81 mg daily, Plavix 75 mg daily, atorvastatin 20 mg daily, Zetia 10 mg daily. -Continue carvedilol 6.25 mg twice daily, irbesartan 75 mg daily. -Discussed the risks and benefits  of proceeding with LHC for further evaluation with the patient.  He is agreeable to proceed.  NPO until LHC this afternoon (11/22) with Dr. Darrold Junker.  Written consent obtained.  Further recommendations following LHC.    # L pleural effusion Patient with hx of chronic recurrent L pleural effusion noted to be large on CT chest.  -S/p thoracentesis this AM with 650 mL removed. -Management per primary.  # Chronic kidney disease stage IIIa Cr 1.29 on admission, near baseline.  -Continue to monitor closely.   TIMI Risk Score for Unstable Angina or Non-ST Elevation MI:   The patient's TIMI risk score is 6, which indicates a 41% risk of all cause mortality, new or recurrent myocardial infarction or need for urgent revascularization in the next 14 days.   This patient's plan of care was discussed and created with Dr. Darrold Junker and he is in agreement.  Signed: Gale Journey, PA-C  04/13/2023, 3:50 PM [facility] Cardiology

## 2023-04-13 NOTE — ED Notes (Signed)
Pt assisted to toilet. Pt changed into hospital gown. Left upper back bleeding from thoracentesis- pt cleaned and new bandage applied. Linens changed and chux applied.

## 2023-04-14 ENCOUNTER — Inpatient Hospital Stay: Payer: Medicare Other

## 2023-04-14 ENCOUNTER — Inpatient Hospital Stay
Admit: 2023-04-14 | Discharge: 2023-04-14 | Disposition: A | Discharge status: Home / Self Care | Payer: Medicare Other | Attending: Student | Admitting: Student

## 2023-04-14 DIAGNOSIS — R570 Cardiogenic shock: Secondary | ICD-10-CM | POA: Diagnosis not present

## 2023-04-14 DIAGNOSIS — D62 Acute posthemorrhagic anemia: Secondary | ICD-10-CM | POA: Diagnosis not present

## 2023-04-14 DIAGNOSIS — I214 Non-ST elevation (NSTEMI) myocardial infarction: Secondary | ICD-10-CM | POA: Diagnosis not present

## 2023-04-14 DIAGNOSIS — J9601 Acute respiratory failure with hypoxia: Secondary | ICD-10-CM | POA: Diagnosis not present

## 2023-04-14 LAB — HEPATIC FUNCTION PANEL
ALT: 59 U/L — ABNORMAL HIGH (ref 0–44)
AST: 131 U/L — ABNORMAL HIGH (ref 15–41)
Albumin: 2.5 g/dL — ABNORMAL LOW (ref 3.5–5.0)
Alkaline Phosphatase: 45 U/L (ref 38–126)
Bilirubin, Direct: 0.4 mg/dL — ABNORMAL HIGH (ref 0.0–0.2)
Indirect Bilirubin: 1.4 mg/dL — ABNORMAL HIGH (ref 0.3–0.9)
Total Bilirubin: 1.8 mg/dL — ABNORMAL HIGH (ref <1.2)
Total Protein: 4 g/dL — ABNORMAL LOW (ref 6.5–8.1)

## 2023-04-14 LAB — BLOOD GAS, ARTERIAL
Acid-base deficit: 0.3 mmol/L (ref 0.0–2.0)
Acid-base deficit: 11.8 mmol/L — ABNORMAL HIGH (ref 0.0–2.0)
Acid-base deficit: 15.7 mmol/L — ABNORMAL HIGH (ref 0.0–2.0)
Bicarbonate: 11.7 mmol/L — ABNORMAL LOW (ref 20.0–28.0)
Bicarbonate: 14.6 mmol/L — ABNORMAL LOW (ref 20.0–28.0)
Bicarbonate: 24.1 mmol/L (ref 20.0–28.0)
FIO2: 100 %
FIO2: 30 %
FIO2: 50 %
MECHVT: 400 mL
MECHVT: 400 mL
MECHVT: 480 mL
Mechanical Rate: 11
Mechanical Rate: 14
Mechanical Rate: 14
O2 Saturation: 100 %
O2 Saturation: 100 %
O2 Saturation: 100 %
PEEP: 5 cmH2O
PEEP: 8 cmH2O
PEEP: 8 cmH2O
Patient temperature: 37
Patient temperature: 37
Patient temperature: 37
pCO2 arterial: 32 mm[Hg] (ref 32–48)
pCO2 arterial: 34 mm[Hg] (ref 32–48)
pCO2 arterial: 38 mm[Hg] (ref 32–48)
pH, Arterial: 7.17 — CL (ref 7.35–7.45)
pH, Arterial: 7.24 — ABNORMAL LOW (ref 7.35–7.45)
pH, Arterial: 7.41 (ref 7.35–7.45)
pO2, Arterial: 129 mm[Hg] — ABNORMAL HIGH (ref 83–108)
pO2, Arterial: 228 mm[Hg] — ABNORMAL HIGH (ref 83–108)
pO2, Arterial: 378 mm[Hg] — ABNORMAL HIGH (ref 83–108)

## 2023-04-14 LAB — CBC
HCT: 39.4 % (ref 36.0–46.0)
HCT: 41.6 % (ref 36.0–46.0)
Hemoglobin: 13.3 g/dL (ref 12.0–15.0)
Hemoglobin: 14.1 g/dL (ref 12.0–15.0)
MCH: 29.6 pg (ref 26.0–34.0)
MCH: 29.8 pg (ref 26.0–34.0)
MCHC: 33.8 g/dL (ref 30.0–36.0)
MCHC: 33.9 g/dL (ref 30.0–36.0)
MCV: 87.8 fL (ref 80.0–100.0)
MCV: 87.9 fL (ref 80.0–100.0)
Platelets: 105 10*3/uL — ABNORMAL LOW (ref 150–400)
Platelets: 83 10*3/uL — ABNORMAL LOW (ref 150–400)
RBC: 4.49 MIL/uL (ref 3.87–5.11)
RBC: 4.73 MIL/uL (ref 3.87–5.11)
RDW: 14.1 % (ref 11.5–15.5)
RDW: 14.8 % (ref 11.5–15.5)
WBC: 13.3 10*3/uL — ABNORMAL HIGH (ref 4.0–10.5)
WBC: 8.8 10*3/uL (ref 4.0–10.5)
nRBC: 0 % (ref 0.0–0.2)
nRBC: 0 % (ref 0.0–0.2)

## 2023-04-14 LAB — ECHOCARDIOGRAM COMPLETE BUBBLE STUDY
AR max vel: 1.34 cm2
AV Area VTI: 1.14 cm2
AV Area mean vel: 1.26 cm2
AV Mean grad: 3 mm[Hg]
AV Peak grad: 4.6 mm[Hg]
Ao pk vel: 1.07 m/s
Area-P 1/2: 5.84 cm2
Calc EF: 26 %
MV VTI: 0.95 cm2
S' Lateral: 3.4 cm
Single Plane A2C EF: 27.5 %
Single Plane A4C EF: 29.1 %

## 2023-04-14 LAB — CBC WITH DIFFERENTIAL/PLATELET
Abs Immature Granulocytes: 0.06 10*3/uL (ref 0.00–0.07)
Basophils Absolute: 0 10*3/uL (ref 0.0–0.1)
Basophils Relative: 0 %
Eosinophils Absolute: 0 10*3/uL (ref 0.0–0.5)
Eosinophils Relative: 0 %
HCT: 29.1 % — ABNORMAL LOW (ref 36.0–46.0)
Hemoglobin: 10.4 g/dL — ABNORMAL LOW (ref 12.0–15.0)
Immature Granulocytes: 0 %
Lymphocytes Relative: 8 %
Lymphs Abs: 1.3 10*3/uL (ref 0.7–4.0)
MCH: 30.1 pg (ref 26.0–34.0)
MCHC: 35.7 g/dL (ref 30.0–36.0)
MCV: 84.3 fL (ref 80.0–100.0)
Monocytes Absolute: 1.2 10*3/uL — ABNORMAL HIGH (ref 0.1–1.0)
Monocytes Relative: 7 %
Neutro Abs: 14.3 10*3/uL — ABNORMAL HIGH (ref 1.7–7.7)
Neutrophils Relative %: 85 %
Platelets: 92 10*3/uL — ABNORMAL LOW (ref 150–400)
RBC: 3.45 MIL/uL — ABNORMAL LOW (ref 3.87–5.11)
RDW: 15 % (ref 11.5–15.5)
WBC: 16.9 10*3/uL — ABNORMAL HIGH (ref 4.0–10.5)
nRBC: 0 % (ref 0.0–0.2)

## 2023-04-14 LAB — PREPARE RBC (CROSSMATCH)

## 2023-04-14 LAB — BASIC METABOLIC PANEL
Anion gap: 13 (ref 5–15)
BUN: 28 mg/dL — ABNORMAL HIGH (ref 8–23)
CO2: 18 mmol/L — ABNORMAL LOW (ref 22–32)
Calcium: 6.8 mg/dL — ABNORMAL LOW (ref 8.9–10.3)
Chloride: 109 mmol/L (ref 98–111)
Creatinine, Ser: 1.63 mg/dL — ABNORMAL HIGH (ref 0.44–1.00)
GFR, Estimated: 32 mL/min — ABNORMAL LOW (ref >60)
Glucose, Bld: 182 mg/dL — ABNORMAL HIGH (ref 70–99)
Potassium: 4.7 mmol/L (ref 3.5–5.1)
Sodium: 140 mmol/L (ref 135–145)

## 2023-04-14 LAB — MRSA NEXT GEN BY PCR, NASAL: MRSA by PCR Next Gen: NOT DETECTED

## 2023-04-14 LAB — LACTIC ACID, PLASMA
Lactic Acid, Venous: 4.3 mmol/L (ref 0.5–1.9)
Lactic Acid, Venous: 6.1 mmol/L (ref 0.5–1.9)
Lactic Acid, Venous: 5.5 mmol/L (ref 0.5–1.9)
Lactic Acid, Venous: 2.6 mmol/L (ref 0.5–1.9)

## 2023-04-14 LAB — HEMOGLOBIN AND HEMATOCRIT, BLOOD
HCT: 30 % — ABNORMAL LOW (ref 36.0–46.0)
Hemoglobin: 10.6 g/dL — ABNORMAL LOW (ref 12.0–15.0)

## 2023-04-14 LAB — ABO/RH: ABO/RH(D): O POS

## 2023-04-14 LAB — TROPONIN I (HIGH SENSITIVITY): Troponin I (High Sensitivity): >24000 ng/L (ref <18)

## 2023-04-14 MED ORDER — MIDODRINE HCL 5 MG PO TABS
10.0000 mg | ORAL_TABLET | Freq: Three times a day (TID) | ORAL | Status: DC
  Administered 2023-04-14 – 2023-04-15 (×3): 10 mg
  Filled 2023-04-14 (×3): qty 2

## 2023-04-14 MED ORDER — ORAL CARE MOUTH RINSE
15.0000 mL | OROMUCOSAL | Status: DC
  Administered 2023-04-14 – 2023-04-15 (×14): 15 mL via OROMUCOSAL

## 2023-04-14 MED ORDER — THIAMINE HCL 100 MG/ML IJ SOLN
100.0000 mg | Freq: Every day | INTRAMUSCULAR | Status: DC
  Administered 2023-04-15: 100 mg via INTRAVENOUS
  Filled 2023-04-14 (×2): qty 2

## 2023-04-14 MED ORDER — SODIUM BICARBONATE 8.4 % IV SOLN
INTRAVENOUS | Status: AC
  Administered 2023-04-14: 50 meq via INTRAVENOUS
  Filled 2023-04-14: qty 50

## 2023-04-14 MED ORDER — FENTANYL BOLUS VIA INFUSION
25.0000 ug | INTRAVENOUS | Status: DC | PRN
Start: 2023-04-14 — End: 2023-04-15
  Administered 2023-04-14 – 2023-04-15 (×2): 25 ug via INTRAVENOUS

## 2023-04-14 MED ORDER — FENTANYL 2500MCG IN NS 250ML (10MCG/ML) PREMIX INFUSION
INTRAVENOUS | Status: AC
  Administered 2023-04-14: 50 ug/h via INTRAVENOUS
  Filled 2023-04-14: qty 250

## 2023-04-14 MED ORDER — PROPOFOL 1000 MG/100ML IV EMUL: 0.0000 ug/kg/min | INTRAVENOUS | Status: DC

## 2023-04-14 MED ORDER — MIDAZOLAM HCL 2 MG/2ML IJ SOLN
INTRAMUSCULAR | Status: AC
  Administered 2023-04-14: 2 mg via INTRAVENOUS
  Filled 2023-04-14: qty 2

## 2023-04-14 MED ORDER — 0.9 % SODIUM CHLORIDE (POUR BTL) OPTIME
TOPICAL | Status: DC | PRN
  Administered 2023-04-14: 150 mL

## 2023-04-14 MED ORDER — LACTATED RINGERS IV BOLUS
1000.0000 mL | Freq: Once | INTRAVENOUS | Status: AC
  Administered 2023-04-14: 500 mL via INTRAVENOUS

## 2023-04-14 MED ORDER — MIDAZOLAM HCL 2 MG/2ML IJ SOLN: 2.0000 mg | INTRAMUSCULAR | Status: AC

## 2023-04-14 MED ORDER — STERILE WATER FOR INJECTION IV SOLN
INTRAVENOUS | Status: DC
  Filled 2023-04-14: qty 150
  Filled 2023-04-14: qty 1000

## 2023-04-14 MED ORDER — ACETAMINOPHEN 325 MG PO TABS
650.0000 mg | ORAL_TABLET | ORAL | Status: DC | PRN
  Administered 2023-04-20: 650 mg
  Filled 2023-04-14: qty 2

## 2023-04-14 MED ORDER — DOCUSATE SODIUM 50 MG/5ML PO LIQD
100.0000 mg | Freq: Two times a day (BID) | ORAL | Status: DC
  Administered 2023-04-14: 100 mg
  Filled 2023-04-14 (×2): qty 10

## 2023-04-14 MED ORDER — ONDANSETRON HCL 4 MG/2ML IJ SOLN
INTRAMUSCULAR | Status: DC | PRN
  Administered 2023-04-14: 4 mg via INTRAVENOUS

## 2023-04-14 MED ORDER — HYDROCORTISONE SOD SUC (PF) 100 MG IJ SOLR: 100.0000 mg | Freq: Once | INTRAMUSCULAR | Status: AC

## 2023-04-14 MED ORDER — THIAMINE MONONITRATE 100 MG PO TABS
100.0000 mg | ORAL_TABLET | Freq: Every day | ORAL | Status: DC
  Administered 2023-04-14 – 2023-04-18 (×4): 100 mg
  Filled 2023-04-14 (×4): qty 1

## 2023-04-14 MED ORDER — FENTANYL 2500MCG IN NS 250ML (10MCG/ML) PREMIX INFUSION: 0.0000 ug/h | INTRAVENOUS | Status: DC

## 2023-04-14 MED ORDER — PANTOPRAZOLE SODIUM 40 MG IV SOLR
40.0000 mg | Freq: Every day | INTRAVENOUS | Status: DC
  Administered 2023-04-14 – 2023-04-18 (×5): 40 mg via INTRAVENOUS
  Filled 2023-04-14 (×5): qty 10

## 2023-04-14 MED ORDER — ALBUMIN HUMAN 5 % IV SOLN: INTRAVENOUS | Status: DC | PRN

## 2023-04-14 MED ORDER — EPINEPHRINE 1 MG/10ML IJ SOSY
PREFILLED_SYRINGE | INTRAMUSCULAR | Status: AC
  Administered 2023-04-14: 1 mg
  Filled 2023-04-14: qty 10

## 2023-04-14 MED ORDER — BUPIVACAINE LIPOSOME 1.3 % IJ SUSP
INTRAMUSCULAR | Status: AC
  Filled 2023-04-14: qty 20

## 2023-04-14 MED ORDER — NOREPINEPHRINE 16 MG/250ML-% IV SOLN
0.0000 ug/min | INTRAVENOUS | Status: DC
  Administered 2023-04-14: 15 ug/min via INTRAVENOUS
  Administered 2023-04-14: 19 ug/min via INTRAVENOUS
  Administered 2023-04-15: 2 ug/min via INTRAVENOUS
  Filled 2023-04-14 (×2): qty 250

## 2023-04-14 MED ORDER — ORAL CARE MOUTH RINSE
15.0000 mL | OROMUCOSAL | Status: DC | PRN
Start: 2023-04-14 — End: 2023-04-15

## 2023-04-14 MED ORDER — ADULT MULTIVITAMIN W/MINERALS CH
1.0000 | ORAL_TABLET | Freq: Every day | ORAL | Status: DC
  Administered 2023-04-14: 1
  Filled 2023-04-14: qty 1

## 2023-04-14 MED ORDER — HYDROCORTISONE SOD SUC (PF) 100 MG IJ SOLR
INTRAMUSCULAR | Status: AC
  Administered 2023-04-14: 100 mg via INTRAVENOUS
  Filled 2023-04-14: qty 2

## 2023-04-14 MED ORDER — SODIUM BICARBONATE 8.4 % IV SOLN: 50.0000 meq | INTRAVENOUS | Status: AC

## 2023-04-14 MED ORDER — EPINEPHRINE HCL 5 MG/250ML IV SOLN IN NS
0.5000 ug/min | INTRAVENOUS | Status: DC
  Administered 2023-04-14: 0.5 ug/min via INTRAVENOUS
  Filled 2023-04-14: qty 250

## 2023-04-14 MED ORDER — DEXMEDETOMIDINE HCL IN NACL 400 MCG/100ML IV SOLN
0.0000 ug/kg/h | INTRAVENOUS | Status: DC
  Administered 2023-04-14: 0.4 ug/kg/h via INTRAVENOUS
  Administered 2023-04-15: 0.3 ug/kg/h via INTRAVENOUS
  Filled 2023-04-14 (×2): qty 100

## 2023-04-14 MED ORDER — ASPIRIN 81 MG PO CHEW
81.0000 mg | CHEWABLE_TABLET | Freq: Every day | ORAL | Status: DC
  Administered 2023-04-15 – 2023-04-18 (×4): 81 mg
  Filled 2023-04-14 (×4): qty 1

## 2023-04-14 MED ORDER — PROPOFOL 1000 MG/100ML IV EMUL
INTRAVENOUS | Status: AC
  Administered 2023-04-14: 5 ug/kg/min via INTRAVENOUS
  Filled 2023-04-14: qty 100

## 2023-04-14 MED ORDER — ALBUMIN HUMAN 25 % IV SOLN
12.5000 g | Freq: Once | INTRAVENOUS | Status: AC
  Administered 2023-04-14: 12.5 g via INTRAVENOUS
  Filled 2023-04-14: qty 50

## 2023-04-14 MED ORDER — ONDANSETRON HCL 4 MG/2ML IJ SOLN
INTRAMUSCULAR | Status: AC
  Filled 2023-04-14: qty 2

## 2023-04-14 MED ORDER — LACTATED RINGERS IV BOLUS
1000.0000 mL | Freq: Once | INTRAVENOUS | Status: AC
  Administered 2023-04-14: 1000 mL via INTRAVENOUS

## 2023-04-14 MED ORDER — FENTANYL CITRATE (PF) 100 MCG/2ML IJ SOLN
INTRAMUSCULAR | Status: DC | PRN
  Administered 2023-04-14: 100 ug via INTRAVENOUS

## 2023-04-14 MED ORDER — CARVEDILOL 6.25 MG PO TABS
6.2500 mg | ORAL_TABLET | Freq: Two times a day (BID) | ORAL | Status: DC
Start: 2023-04-14 — End: 2023-04-14

## 2023-04-14 MED ORDER — SODIUM BICARBONATE 8.4 % IV SOLN
50.0000 meq | INTRAVENOUS | Status: AC
  Administered 2023-04-14: 50 meq via INTRAVENOUS
  Filled 2023-04-14: qty 50

## 2023-04-14 MED ORDER — CHLORHEXIDINE GLUCONATE CLOTH 2 % EX PADS
6.0000 | MEDICATED_PAD | Freq: Every day | CUTANEOUS | Status: DC
  Administered 2023-04-14 – 2023-04-19 (×6): 6 via TOPICAL

## 2023-04-14 MED ORDER — FOLIC ACID 1 MG PO TABS
1.0000 mg | ORAL_TABLET | Freq: Every day | ORAL | Status: DC
  Administered 2023-04-14: 1 mg
  Filled 2023-04-14: qty 1

## 2023-04-14 MED ORDER — MIDAZOLAM HCL 2 MG/2ML IJ SOLN: 1.0000 mg | INTRAMUSCULAR | Status: DC | PRN

## 2023-04-14 MED ORDER — BUPIVACAINE LIPOSOME 1.3 % IJ SUSP
INTRAMUSCULAR | Status: DC | PRN
  Administered 2023-04-14: 50 mL

## 2023-04-14 MED ORDER — CLOPIDOGREL BISULFATE 75 MG PO TABS
75.0000 mg | ORAL_TABLET | Freq: Every day | ORAL | Status: DC
  Administered 2023-04-15 – 2023-04-18 (×4): 75 mg
  Filled 2023-04-14 (×4): qty 1

## 2023-04-14 MED ORDER — POLYETHYLENE GLYCOL 3350 17 G PO PACK
17.0000 g | PACK | Freq: Every day | ORAL | Status: DC
  Administered 2023-04-14: 17 g
  Filled 2023-04-14: qty 1

## 2023-04-14 NOTE — Op Note (Signed)
OPERATIVE NOTE   PROCEDURE: 1.   Right groin exploration with repair of the right external iliac and right lateral circumflex arteries    PRE-OPERATIVE DIAGNOSIS: 1.Hemorrhagic shock 2.  Hemorrhage from the right external iliac and right lateral circumflex arteries  POST-OPERATIVE DIAGNOSIS: Same  SURGEON: Samwise Eckardt A, MD  CO-surgeon: None  ANESTHESIA:  general  ESTIMATED BLOOD LOSS: 250 cc  FINDING(S): 1.  Active bleeding from the right distal external iliac and right circumflex arteries  SPECIMEN(S): None  INDICATIONS:    Patient presents with NSTEMI.  The patient underwent a cardiac cath with LAD stenting via the right groin.  A 6 French sheath was used with Mynx closure.  Postprocedure the patient became profoundly hypotensive with right groin pain.  CTA obtained revealed large right retroperitoneal hematoma along with active extravasation from the right external iliac.  Therefore I took her emergently to the operating room for repair.  I had an extensive discussion with the patient's daughter regarding the critical nature of the patient and intended procedure.  Understanding expressed consent was obtained..    DESCRIPTION: After obtaining full informed written consent, the patient was brought urgently back to the operating room intubated from the ICU and placed supine upon the operating table.  The patient received IV antibiotics prior to induction.  After obtaining adequate anesthesia, the patient was prepped and draped in the standard fashion appropriate time out is called.    Vertical incision was created overlying the right femoral arteries.  This was carried down with electrical cautery to the right common femoral artery.  There was active bleeding upon entering the deeper tissues.  I was able to obtain control of the common femoral artery by encircling with a vessel loop.  After further exploration I noted there was active bleeding from the transected lateral circumflex  artery.  This was ligated using a 5-0 Prolene on either side of the transected segment.  I then carefully dissected proximally and partially open the inguinal ligament there was significant bleeding however, I was able to enter the retroperitoneum and obtain digital control of the external iliac.  I then carefully dissected from the normal common femoral artery proximally to the external iliac.  Identified the access site in the distal external iliac.  The site was closed with several 5-0 Prolene sutures.  I was able to obtain control and hemostasis was obtained.  Upon further exploration of the right groin there were small venous branches that were bleeding and controlled with 3-0 silk sutures.  The wound was then copiously irrigated with saline.  There was a good palpable pulse within the wound in the superficial femoral artery distal to my repair.  There was residual oozing from the retroperitoneal space.  I was able to close the inguinal ligament using 0 Vicryl.  I ensured hemostasis within the wound.  I did place Surgicel over the repaired external iliac access site.  The wound was then closed in layers using 0 Vicryl; 2-0 Vicryl ;3-0 Vicryl sutures  and staples were used to approximate the skin.  A Prevena VAC was placed as an occlusive dressing..    The patient was then taken to the ICU intubated in critical condition.  COMPLICATIONS: None  CONDITION: Stable     Tenleigh Byer A 04/14/2023 2:03 AM   This note was created with Dragon Medical transcription system. Any errors in dictation are purely unintentional.

## 2023-04-14 NOTE — Progress Notes (Signed)
[facility] Cardiology  SUBJECTIVE: Patient intubated   Vitals:   04/14/23 0615 04/14/23 0630 04/14/23 0700 04/14/23 0715  BP:      Pulse:    (!) 109  Resp: 14 14 14 14   Temp:      TempSrc:      SpO2:    99%  Weight:      Height:         Intake/Output Summary (Last 24 hours) at 04/14/2023 3329 Last data filed at 04/14/2023 5188 Gross per 24 hour  Intake 3676.18 ml  Output 800 ml  Net 2876.18 ml      PHYSICAL EXAM  General: Intubated HEENT:  Normocephalic and atramatic Neck:  No JVD.  Lungs: Clear bilaterally to auscultation and percussion. Heart: HRRR . Normal S1 and S2 without gallops or murmurs.  Abdomen: Bowel sounds are positive, abdomen soft and non-tender  Msk:  Back normal, normal gait. Normal strength and tone for age. Extremities: No clubbing, cyanosis or edema.   Neuro: Intubated Psych: Intubated   LABS: Basic Metabolic Panel: Recent Labs    04/13/23 0415 04/14/23 0542  NA 142 140  K 3.9 4.7  CL 107 109  CO2 21* 18*  GLUCOSE 124* 182*  BUN 24* 28*  CREATININE 1.24* 1.63*  CALCIUM 9.3 6.8*  MG 2.0  --   PHOS 3.9  --    Liver Function Tests: Recent Labs    04/13/23 0415 04/14/23 0624  AST 38 131*  ALT 21 59*  ALKPHOS 86 45  BILITOT 1.0 1.8*  PROT 6.9 4.0*  ALBUMIN 3.6 2.5*   No results for input(s): "LIPASE", "AMYLASE" in the last 72 hours. CBC: Recent Labs    04/13/23 0415 04/14/23 0044 04/14/23 0542  WBC 9.5 8.8 13.3*  NEUTROABS 8.1*  --   --   HGB 14.3 14.1 13.3  HCT 43.6 41.6 39.4  MCV 86.9 87.9 87.8  PLT 192 83* 105*   Cardiac Enzymes: No results for input(s): "CKTOTAL", "CKMB", "CKMBINDEX", "TROPONINI" in the last 72 hours. BNP: Invalid input(s): "POCBNP" D-Dimer: Recent Labs    04/12/23 2147  DDIMER 0.79*   Hemoglobin A1C: No results for input(s): "HGBA1C" in the last 72 hours. Fasting Lipid Panel: Recent Labs    04/12/23 1739  CHOL 151  HDL 61  LDLCALC 67  TRIG 115  CHOLHDL 2.5   Thyroid Function  Tests: Recent Labs    04/12/23 1739  TSH 3.374   Anemia Panel: No results for input(s): "VITAMINB12", "FOLATE", "FERRITIN", "TIBC", "IRON", "RETICCTPCT" in the last 72 hours.  DG Abd 1 View  Result Date: 04/14/2023 CLINICAL DATA:  Orogastric tube placement EXAM: ABDOMEN - 1 VIEW COMPARISON:  None Available. FINDINGS: Orogastric tube tip seen within the proximal body of the stomach with the proximal side hole in the region of the gastroesophageal junction. Nonobstructive bowel gas pattern. No gross free intraperitoneal gas. No organomegaly. Retained contrast opacifies the renal cortices bilaterally related to CT arteriogram performed 04/13/2023. Degenerative changes noted within the lumbar spine. IMPRESSION: 1. Orogastric tube tip within the proximal body of the stomach. Advancement of the catheter by 5-10 cm may more optimally position the device. Electronically Signed   By: Helyn Numbers M.D.   On: 04/14/2023 02:28   DG Chest Port 1 View  Result Date: 04/14/2023 CLINICAL DATA:  Chest tube placement EXAM: PORTABLE CHEST 1 VIEW COMPARISON:  04/13/2023 FINDINGS: Endotracheal tube is seen 2.2 cm above the carina. Nasogastric tube extends into the upper abdomen beyond margin of  the examination. Right internal jugular central venous catheter is in place with its tip at the superior cavoatrial junction. Lung volumes are small with mild elevation of the right hemidiaphragm. Retrocardiac opacity persists in keeping with atelectasis or infiltrate within this region. Lungs are otherwise clear. No pneumothorax or pleural effusion. Cardiac size within normal limits. Pulmonary vascularity is normal. No acute bone abnormality. No chest tube identified within the visualized thorax. IMPRESSION: 1. Support tubes in appropriate position. 2. No chest tube identified within the visualized thorax. No pneumothorax. 3. Persistent retrocardiac atelectasis or infiltrate. Electronically Signed   By: Helyn Numbers M.D.    On: 04/14/2023 02:26   CARDIAC CATHETERIZATION  Result Date: 04/13/2023   Mid LAD lesion is 30% stenosed.   Ost LAD to Prox LAD lesion is 99% stenosed.   A drug-eluting stent was successfully placed using a STENT ONYX FRONTIER 3.5X12.   Post intervention, there is a 0% residual stenosis.   There is mild left ventricular systolic dysfunction.   The left ventricular ejection fraction is 45-50% by visual estimate. 1.  High-grade in-stent restenosis ostial LAD 2.  Successful PCI with 3.5 x 12 mm Onyx frontier DES 3.  Right groin hematoma Recommendations 1.  Dual antiplatelet therapy uninterrupted 1 year 2.  Abdominal CT to rule out retroperitoneal bleed 3.  Further recommendations pending abdominal CT results   CT Angio Abd/Pel w/ and/or w/o  Result Date: 04/13/2023 CLINICAL DATA:  Suspected retroperitoneal hemorrhage from cath lab EXAM: CTA ABDOMEN AND PELVIS WITHOUT AND WITH CONTRAST TECHNIQUE: Multidetector CT imaging of the abdomen and pelvis was performed using the standard protocol during bolus administration of intravenous contrast. Multiplanar reconstructed images and MIPs were obtained and reviewed to evaluate the vascular anatomy. RADIATION DOSE REDUCTION: This exam was performed according to the departmental dose-optimization program which includes automated exposure control, adjustment of the mA and/or kV according to patient size and/or use of iterative reconstruction technique. CONTRAST:  80mL OMNIPAQUE IOHEXOL 350 MG/ML SOLN COMPARISON:  CT abdomen pelvis, 12/13/2021 FINDINGS: VASCULAR Large right-sided retroperitoneal hematoma arising in the vicinity of the right external iliac artery and extending into the right paracolic gutter, dimensions at least 18.6 x 7.7 x 6.2 cm (series 15, image 44, series 10, image 139). Brisk arterial extravasation arising in the vicinity of the right inferior epigastric artery origin and distal portion of the right external iliac artery (series 10, image 161, series  15, image 44). Normal contour and caliber of the abdominal aorta. No evidence of aortic aneurysm, dissection, or other acute aortic pathology. 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 Moderate mixed calcific atherosclerosis. Review of the MIP images confirms the above findings. NON-VASCULAR Lower Chest: Moderate left, small right pleural effusions and associated atelectasis or consolidation. Bilateral breast implants small hiatal hernia. Hepatobiliary: No solid liver abnormality is seen. No gallstones, gallbladder wall thickening, or biliary dilatation. Pancreas: Unremarkable. No pancreatic ductal dilatation or surrounding inflammatory changes. Spleen: Normal in size without significant abnormality. Adrenals/Urinary Tract: Adrenal glands are unremarkable. Extensive, lobulated bilateral renal cortical scarring. No calculi or hydronephrosis. Excreted contrast within the collecting systems and bladder following catheterization. Foley catheter in the bladder. Stomach/Bowel: Stomach is within normal limits. Appendix appears normal. No evidence of bowel wall thickening, distention, or inflammatory changes. Sigmoid diverticula. Lymphatic: No enlarged abdominal or pelvic lymph nodes. Reproductive: No mass or other significant abnormality. Other: No abdominal wall hernia or abnormality. Small volume hemoperitoneum about the liver  and in the pelvis (series 10, image 156, series 5, image 17). Musculoskeletal: No acute osseous findings. IMPRESSION: 1. Large right-sided retroperitoneal hematoma arising in the vicinity of the right external iliac artery and extending into the right paracolic gutter, dimensions at least 18.6 x 7.7 x 6.2 cm. 2. Brisk arterial extravasation arising in the vicinity of the right inferior epigastric artery origin and distal portion of the right external iliac artery. 3. Small volume hemoperitoneum about the liver  and in the pelvis. 4. Moderate left, small right pleural effusions and associated atelectasis or consolidation. These results were called by telephone at the time of interpretation on 04/13/2023 at 9:25 pm to [facility] Aren Cherne , who verbally acknowledged these results. Aortic Atherosclerosis (ICD10-I70.0). Electronically Signed   By: Jearld Lesch M.D.   On: 04/13/2023 21:38   US THORACENTESIS ASP PLEURAL SPACE W/IMG GUIDE  Result Date: 04/13/2023 INDICATION: Patient with history of CAD s/p stenting, admitted with complaints of new chest pain and found to have large left pleural effusion. Request for diagnostic and therapeutic left thoracentesis. EXAM: ULTRASOUND GUIDED LEFT THORACENTESIS MEDICATIONS: 15 mL 1% lidocaine COMPLICATIONS: None immediate. PROCEDURE: An ultrasound guided thoracentesis was thoroughly discussed with the patient and questions answered. The benefits, risks, alternatives and complications were also discussed. The patient understands and wishes to proceed with the procedure. Written consent was obtained. Ultrasound was performed to localize and mark an adequate pocket of fluid in the left chest. The area was then prepped and draped in the normal sterile fashion. 1% Lidocaine was used for local anesthesia. Under ultrasound guidance a 6 Fr Safe-T-Centesis catheter was introduced. Thoracentesis was performed. The catheter was removed and a dressing applied. FINDINGS: A total of approximately 650 mL of clear yellow fluid was removed. Samples were sent to the laboratory as requested by the clinical team. IMPRESSION: Successful ultrasound guided left thoracentesis yielding 650 mL of pleural fluid. Performed by Lynnette Caffey, PA-C Electronically Signed   By: Olive Bass M.D.   On: 04/13/2023 14:13   DG Chest Port 1 View  Result Date: 04/13/2023 CLINICAL DATA:  [id number] Pleural effusion on left [id number]. Status post thoracentesis. EXAM: PORTABLE CHEST 1 VIEW COMPARISON:  04/12/2023.  FINDINGS: There is small left pleural effusion with probable associated compressive atelectatic changes in the left lung. There is interval improvement when compared to the prior exam, status post left thoracentesis. No left pneumothorax. Biapical pleural thickening/calcifications again seen. Bilateral lung fields are otherwise clear. There is subtle blunting of right lateral costophrenic angle, which may represent trace right pleural effusion. Normal cardio-mediastinal silhouette. No acute osseous abnormalities. The soft tissues are within normal limits. IMPRESSION: *Small left pleural effusion, improved since the prior study, status post left thoracentesis. No pneumothorax. Electronically Signed   By: Jules Schick M.D.   On: 04/13/2023 12:58   CT Angio Chest Pulmonary Embolism (PE) W or WO Contrast  Result Date: 04/12/2023 CLINICAL DATA:  Chest pain for 1 hour, hypertension EXAM: CT ANGIOGRAPHY CHEST WITH CONTRAST TECHNIQUE: Multidetector CT imaging of the chest was performed using the standard protocol during bolus administration of intravenous contrast. Multiplanar CT image reconstructions and MIPs were obtained to evaluate the vascular anatomy. RADIATION DOSE REDUCTION: This exam was performed according to the departmental dose-optimization program which includes automated exposure control, adjustment of the mA and/or kV according to patient size and/or use of iterative reconstruction technique. CONTRAST:  60mL OMNIPAQUE IOHEXOL 350 MG/ML SOLN COMPARISON:  12/28/2020, 04/12/2023 FINDINGS: Cardiovascular: This is a technically adequate evaluation of the  pulmonary vasculature. No filling defects or pulmonary emboli. The heart is unremarkable without pericardial effusion. Normal caliber of the thoracic aorta. Atherosclerosis of the aorta and coronary vasculature. Mediastinum/Nodes: No enlarged mediastinal, hilar, or axillary lymph nodes. Thyroid gland, trachea, and esophagus demonstrate no significant  findings. Lungs/Pleura: Large left pleural effusion, volume estimated in excess of 1 L. dependent consolidation within the left lower lobe most consistent with atelectasis. There is a trace right pleural effusion. No airspace disease or pneumothorax. Bilateral bronchial wall thickening, greatest in the lower lobes. Upper Abdomen: No acute abnormality. Musculoskeletal: No acute or destructive bony abnormalities. Reconstructed images demonstrate no additional findings. Review of the MIP images confirms the above findings. IMPRESSION: 1. No evidence of pulmonary embolus. 2. Large left pleural effusion, volume estimated in excess of 1 L. Dependent left lower lobe atelectasis. 3. Trace right pleural effusion. 4. Bilateral bronchial wall thickening, greatest in the lower lobes, which may reflect bronchitis or reactive airway disease. No evidence of pneumonia. 5. Aortic Atherosclerosis (ICD10-I70.0). Coronary artery atherosclerosis. Electronically Signed   By: Sharlet Salina M.D.   On: 04/12/2023 23:46   DG Chest 2 View  Result Date: 04/12/2023 CLINICAL DATA:  Chest pain.  Hypertension. EXAM: CHEST - 2 VIEW COMPARISON:  10/06/2021 FINDINGS: Midline trachea. Mild cardiomegaly. Increase in small to moderate left pleural effusion. Biapical pleural thickening. No pneumothorax. No congestive failure. Worsened left lower lobe airspace disease. IMPRESSION: Increase in small to moderate left pleural effusion. Progressive adjacent left base airspace disease which could represent atelectasis or infection. Cardiomegaly without congestive failure. Electronically Signed   By: Jeronimo Greaves M.D.   On: 04/12/2023 16:58     Echo pending  TELEMETRY: Sinus rhythm 90 bpm:  ASSESSMENT AND PLAN:  Principal Problem:   Chest pain Active Problems:   CKD (chronic kidney disease) stage 3, GFR 30-59 ml/min (HCC)   Essential hypertension   Chronic diastolic heart failure (HCC)   NSTEMI (non-ST elevated myocardial infarction)  (HCC)   Tobacco abuse    1.  NSTEMI, status post DES LAD 02/2022 at [facility], underwent cardiac catheterization 04/13/2023 which revealed grade in-stent restenosis ostial LAD, underwent successful PCI with DES ostial LAD, created by retroperitoneal hematoma 2.  Retroperitoneal hematoma following PCI requiring successful surgical repair of external iliac, remains intubated, pressors 3.  Chronic HFrEF, 2D echo pending 4.  Left pleural effusion, status post thoracentesis 5.  Chronic kidney disease stage IIIa, slight bump in creatinine  Recommendations  1.  Agree with current therapy 2.  Dual antiplatelet therapy uninterrupted 3.  Review 2D echocardiogram 4.  Further recommendations pending 2D echocardiogram results 5.  Possible extubation, wean pressors today per [facility]      Marcina Millard, MD, PhD, [facility] 04/14/2023 9:03 AM

## 2023-04-14 NOTE — Consult Note (Signed)
NAME:  Dawn Bruce, MRN:  [id number], DOB:  1946-04-13, LOS: 2 ADMISSION DATE:  04/12/2023, CONSULTATION DATE:  04/13/23 REFERRING MD:  Marcina Millard  CHIEF COMPLAINT:  Chest Pain    HPI  77 y.o with significant PMH of Chronic Systolic Heart Failure with recovered EF (previously EF 35-40% improved to > 55%), PAF on Eliquis s/p catheter ablation (03/2021), HTN, HLD, previous tobacco use, recurrent pleural effusions (L>R) previously requiring thoracentesis, skin cancer, CAD s/p R CEA, CKD stage 3 who presented to the ED on 04/12/2023 for chest pain and elevated blood pressure   ED Course: Initial vital signs showed HR of 82 beats/minute, BP 173/86 mm Hg, the RR 18 breaths/minute, and the oxygen saturation 95% on RA and a temperature of 98.18F (36.7C).  Pertinent Labs/Diagnostics Findings: Na+/ K+: 141/3.4 Glucose: 187 BUN/Cr.:24/1.29,  WBC: Unremarkable,  BNP: 270 ,Troponin 27 @15 :30 --> 609 @17 :30, (+)D-Dimer 0.79,  EKG nonacute, CXR increase L pleural effusion, CTA no PE but (+)pleural effusion L, bronchitis/RAD but no discrete pneumonia.   Disposition: Admitted to hospitalist service and started heparin infusion. Cardiology consulted to eval for LHC.   Past Medical History  Chronic Systolic Heart Failure with recovered EF (previously EF 35-40% improved to > 55%), PAF on Eliquis s/p catheter ablation (03/2021), HTN, HLD, previous tobacco use, recurrent pleural effusions (L>R) previously requiring thoracentesis, skin cancer, CAD s/p R CEA, CKD stage 3  Significant Hospital Events   11/21: Admitted to stepdown unit under hospitalist service with NSTEMI .  11/22: S/p Left Thoracentesis . S/p LHC with LAD stenting via the right groin. Postop pt became hypotensive requiring vasopressor with right groin pain. CTA obtained and showed large right retroperitoneal hematoma along with active extravasation from the right external iliac and small vol hemoperitoneum about the liver and in  the pelvis. PCCM consulted, pt Intubated for airway protection and pt taken emergently to OR by vascular for repair.  Consults:  PCCM Cardiology Vascular  Procedures:  11/22: LHC with LAD stenting 11/22: Intubation 11/22: Right IJ central line 11/22: Right groin exploration with repair of the right external iliac and right lateral circumflex arteries  11/22: Left radial art line by anesthesiology discontinued 11/22: Left femoral art line  Significant Diagnostic Tests:  11/22: CTA abdomen and pelvis> IMPRESSION: 1. Large right-sided retroperitoneal hematoma arising in the vicinity of the right external iliac artery and extending into the right paracolic gutter, dimensions at least 18.6 x 7.7 x 6.2 cm. 2. Brisk arterial extravasation arising in the vicinity of the right inferior epigastric artery origin and distal portion of the right external iliac artery. 3. Small volume hemoperitoneum about the liver and in the pelvis. 4. Moderate left, small right pleural effusions and associated atelectasis or consolidation.  Interim History / Subjective:      Micro Data:  None  Antimicrobials:  None  OBJECTIVE  Blood pressure 95/62, pulse 61, temperature (!) 97.2 F (36.2 C), temperature source Oral, resp. rate 12, height 5\' 3"  (1.6 m), weight 58.1 kg, SpO2 98%.    Vent Mode: PRVC FiO2 (%):  [50 %-100 %] 50 % Set Rate:  [14 bmp-16 bmp] 14 bmp Vt Set:  [400 mL] 400 mL PEEP:  [5 cmH20-8 cmH20] 8 cmH20 Plateau Pressure:  [12 cmH20] 12 cmH20   Intake/Output Summary (Last 24 hours) at 04/14/2023 0228 Last data filed at 04/14/2023 0125 Gross per 24 hour  Intake 2837.04 ml  Output 650 ml  Net 2187.04 ml   Filed Weights   04/12/23  1518 04/13/23 1645  Weight: 61.2 kg 58.1 kg     Physical Examination  GENERAL:  77 year-old critically ill patient lying in the bed intubated and sedated EYES: PEERLA. No scleral icterus. Extraocular muscles intact.  HEENT: Head atraumatic,  normocephalic. Oropharynx and nasopharynx clear.  NECK:  No JVD, supple  LUNGS: Decreased breath sounds bilaterally.  No use of accessory muscles of respiration.  CARDIOVASCULAR: S1, S2 normal. No murmurs, rubs, or gallops.  ABDOMEN: Soft, NTND,  EXTREMITIES: No swelling or erythema.  Capillary refill < 3 seconds in all extremities. Pulses Diminished distally on the left . Bluish discoloration of upper extremities R>L. Right groin site wound vac in place NEUROLOGIC: The patient is intubated and sedated . No focal neurological deficit appreciated. Cranial nerves are intact.  SKIN: No obvious rash, lesion, or ulcer. Warm to touch Labs/imaging that I havepersonally reviewed  (right click and "Reselect all SmartList Selections" daily)     Labs   CBC: Recent Labs  Lab 04/12/23 1522 04/13/23 0415 04/14/23 0044  WBC 6.3 9.5 8.8  NEUTROABS  --  8.1*  --   HGB 14.0 14.3 14.1  HCT 42.4 43.6 41.6  MCV 86.9 86.9 87.9  PLT 143* 192 83*    Basic Metabolic Panel: Recent Labs  Lab 04/12/23 1522 04/13/23 0415  NA 141 142  K 3.4* 3.9  CL 108 107  CO2 23 21*  GLUCOSE 187* 124*  BUN 24* 24*  CREATININE 1.29* 1.24*  CALCIUM 8.6* 9.3  MG  --  2.0  PHOS  --  3.9   GFR: Estimated Creatinine Clearance: 30.4 mL/min (A) (by C-G formula based on SCr of 1.24 mg/dL (H)). Recent Labs  Lab 04/12/23 1522 04/12/23 1937 04/13/23 0415 04/14/23 0044 04/14/23 0124  WBC 6.3  --  9.5 8.8  --   LATICACIDVEN  --  1.4  --   --  4.3*    Liver Function Tests: Recent Labs  Lab 04/12/23 1739 04/13/23 0415  AST 34 38  ALT 21 21  ALKPHOS 76 86  BILITOT 0.9 1.0  PROT 7.1 6.9  ALBUMIN 3.8 3.6   No results for input(s): "LIPASE", "AMYLASE" in the last 168 hours. No results for input(s): "AMMONIA" in the last 168 hours.  ABG    Component Value Date/Time   PHART 7.17 (LL) 04/14/2023 0001   PCO2ART 32 04/14/2023 0001   PO2ART 378 (H) 04/14/2023 0001   HCO3 11.7 (L) 04/14/2023 0001    ACIDBASEDEF 15.7 (H) 04/14/2023 0001   O2SAT 100 04/14/2023 0001     Coagulation Profile: Recent Labs  Lab 04/13/23 0415  INR 1.1    Cardiac Enzymes: No results for input(s): "CKTOTAL", "CKMB", "CKMBINDEX", "TROPONINI" in the last 168 hours.  HbA1C: Hgb A1c MFr Bld  Date/Time Value Ref Range Status  06/01/2022 12:00 AM 5.5 4.8 - 5.6 % Final    Comment:             Prediabetes: 5.7 - 6.4          Diabetes: >6.4          Glycemic control for adults with diabetes: <7.0   12/12/2021 02:02 PM 5.5 4.8 - 5.6 % Final    Comment:             Prediabetes: 5.7 - 6.4          Diabetes: >6.4          Glycemic control for adults with diabetes: <7.0  CBG: Recent Labs  Lab 04/13/23 2138  GLUCAP 130*   Review of Systems:   Unable to be obtained secondary to the patient's intubated and sedated status.   Past Medical History  She,  has a past medical history of Actinic keratosis, Chronic pain, GERD (gastroesophageal reflux disease) (?), History of basal cell carcinoma (BCC) (05/02/2021), History of SCC (squamous cell carcinoma) of skin (09/10/2019), History of SCC (squamous cell carcinoma) of skin (03/04/2020), History of squamous cell carcinoma in situ (SCCIS) (03/04/2020), History of squamous cell carcinoma in situ (SCCIS) (03/04/2020), History of squamous cell carcinoma in situ (SCCIS) (10/28/2019), History of squamous cell carcinoma in situ (SCCIS) (10/28/2019), Hyperlipidemia, Hypertension, NSTEMI (non-ST elevated myocardial infarction) (HCC) (04/12/2023), Renal disorder, Squamous cell carcinoma in situ (10/31/2021), and Squamous cell carcinoma of skin (08/28/2022).   Surgical History    Past Surgical History:  Procedure Laterality Date   ABDOMINAL HYSTERECTOMY     APPENDECTOMY  April 2023   Burst appendix   AUGMENTATION MAMMAPLASTY Bilateral    25-30 years ago   CARDIOVERSION N/A 12/01/2020   Procedure: CARDIOVERSION;  Surgeon: Debbe Odea, MD;  Location: [facility] ORS;   Service: Cardiovascular;  Laterality: N/A;   CAROTID ARTERY ANGIOPLASTY Right 1995(approximate)   CAROTID ENDARTERECTOMY  2001   COSMETIC SURGERY     ELBOW SURGERY Left    FRACTURE SURGERY  2016   Arm   THORACENTESIS N/A 12/27/2020   Procedure: THORACENTESIS;  Surgeon: Josephine Igo, DO;  Location: [facility];  Service: Pulmonary;  Laterality: N/A;   TONSILLECTOMY     TUBAL LIGATION  ?     Social History   reports that she quit smoking about 34 years ago. Her smoking use included cigarettes. She started smoking about 49 years ago. She has a 3.8 pack-year smoking history. She has been exposed to tobacco smoke. She has never used smokeless tobacco. She reports current alcohol use of about 14.0 standard drinks of alcohol per week. She reports that she does not use drugs.   Family History   Her family history includes Cancer in her father, mother, and sister. There is no history of Breast cancer.   Allergies No Known Allergies   Home Medications  Prior to Admission medications   Medication Sig Start Date End Date Taking? Authorizing Provider  apixaban (ELIQUIS) 2.5 MG TABS tablet Take 2.5 mg by mouth 2 (two) times daily.   Yes [provider]  atorvastatin (LIPITOR) 80 MG tablet Take 80 mg by mouth daily.   Yes [provider]  carvedilol (COREG) 6.25 MG tablet Take 1 tablet (6.25 mg total) by mouth 2 (two) times daily. Patient taking differently: Take 12.5 mg by mouth 2 (two) times daily. 04/27/22  Yes Bacigalupo, Marzella Schlein, MD  clopidogrel (PLAVIX) 75 MG tablet Take 1 tablet (75 mg total) by mouth daily. 04/02/23  Yes Bacigalupo, Marzella Schlein, MD  ezetimibe (ZETIA) 10 MG tablet TAKE 1 TABLET(10 MG) BY MOUTH DAILY 04/05/23  Yes Bacigalupo, Marzella Schlein, MD  famotidine (PEPCID) 40 MG tablet Take 1 tablet (40 mg total) by mouth daily. Patient taking differently: Take 40 mg by mouth daily as needed for indigestion or heartburn. 06/28/20  Yes Bacigalupo, Marzella Schlein, MD  fentaNYL  (DURAGESIC) 25 MCG/HR Place 2 patches onto the skin every 3 (three) days. 04/04/23  Yes Ostwalt, Edmon Crape, PA-C  pregabalin (LYRICA) 150 MG capsule Take 1 capsule (150 mg total) by mouth 2 (two) times daily. 01/19/23  Yes Bacigalupo, Marzella Schlein, MD  traZODone (DESYREL)  50 MG tablet Take 0.5-1 tablets (25-50 mg total) by mouth at bedtime as needed for sleep. 09/21/22  Yes Bacigalupo, Marzella Schlein, MD  valsartan (DIOVAN) 160 MG tablet Take 160 mg by mouth in the morning and at bedtime. 08/30/21  Yes [provider]  Scheduled Meds:  sodium chloride   Intravenous Once   aspirin EC  81 mg Oral Daily   atorvastatin  20 mg Oral Daily   carvedilol  6.25 mg Oral BID WC   Chlorhexidine Gluconate Cloth  6 each Topical Daily   clopidogrel  75 mg Oral Q breakfast   docusate  100 mg Per Tube BID   EPINEPHrine       etomidate       etomidate       ezetimibe  10 mg Oral Daily   fentaNYL (SUBLIMAZE) injection  50 mcg Intravenous Once   ondansetron (ZOFRAN) IV  4 mg Intravenous Once   polyethylene glycol  17 g Per Tube Daily   Continuous Infusions:  sodium chloride     sodium chloride Stopped (04/13/23 2333)   DOPamine Stopped (04/14/23 0220)   epinephrine 0.5 mcg/min (04/14/23 0155)   fentaNYL infusion INTRAVENOUS 50 mcg/hr (04/14/23 0129)   heparin Stopped (04/13/23 1830)   norepinephrine (LEVOPHED) Adult infusion 19 mcg/min (04/14/23 0227)   propofol (DIPRIVAN) infusion 5 mcg/kg/min (04/14/23 0220)   vasopressin 0.04 Units/min (04/13/23 2353)   PRN Meds:.acetaminophen, EPINEPHrine, etomidate, etomidate, hydrALAZINE, midazolam, nitroGLYCERIN, ondansetron (ZOFRAN) IV  Active Hospital Problem list   See systems below  Assessment & Plan:  #Acute Blood Loss Anemia #Hemorrhagic Shock #Right retroperitoneal hematoma with active extravasation from the right external iliac with small volume hemoperitoneum  S/P emergent repair of the right external iliac and right lateral circumflex artery -Monitor for  S/Sx of bleeding -Trend CBC (H&H q6h) -Vasopressors for MAP goal>65 -Transfuse for Hgb <7  (has received 4 units pRBC's so far) -Hold Eliquis -SCD's for VTE Prophylaxis (chemical ppx contraindicated) -Vascular following appreciate input  #NSTEMI #Cardiogenic shock from Profound Hemorrhagic shock #Chronic Systolic Heart Failure with recovered EF (previously EF 35-40% improved to > 55%)  #PAF on Eliquis s/p catheter ablation (03/2021  Hx: CAD s/p R CEA, HTN, HLD S/p left heart cath  with LAD stenting via the right groin POD # 1 -Trend HS Troponin until peaked -Hold GDMT for now in the setting of above -Hold heparin, aspirin and Plavix for now -Continue atorvastatin and Zetia once able to tolerate p.o. -Obtain 2D echo -Cardiology following appreciate input  #Acute Hypoxic Respiratory Failure #Intubated for Airway Protection pre-op #Recurrent Pleural Effusion L>R s/p left thoracentesis 11/22 -full mechanical support 6-8cc/kg/Vt  -titrate FiO2, PEEP to maintain O2 sat >90%  -Lung protective ventilation  -PRN Chest X-ray & ABG -PRN and scheduled bronchodilators -SAT/SBT when appropriate  -prn fentanyl, prop for RASS -1    #AKI on CKD stage III #Anion Gap Metabolic Acidosis due Lactic Acidosis -Monitor I&O's / urinary output -Follow BMP -Ensure adequate renal perfusion -Avoid nephrotoxic agents as able -Replace electrolytes as indicated -2 amps Bicarb & Bicarb gtt if needed -Trend Lactic   Best practice:  Diet:  NPO Pain/Anxiety/Delirium protocol (if indicated): Yes (RASS goal -1) VAP protocol (if indicated): Yes DVT prophylaxis: Contraindicated GI prophylaxis: N/A Glucose control:  SSI Yes Central venous access:  Yes, and it is still needed Arterial line:  Yes, and it is still needed Foley:  Yes, and it is still needed Mobility:  bed rest  PT consulted: N/A Last  date of multidisciplinary goals of care discussion [updated daughter at the bedside] Code Status:  full  code Disposition: ICU   = Goals of Care = Code Status Order: FULL  Primary Emergency Contact: Caraher,Steve Wishes to pursue full aggressive treatment and intervention options, including CPR and intubation, but goals of care will be addressed on going with family if that should become necessary. Patient wishes to pursue ongoing treatment with relatively conservative measures (e.g., IV fluids, antibiotics), but would not wish to escalate to ICU level of care or other invasive measures. Patient wishes to pursue ongoing treatment, but concurred that if deteriorated to pulselessness, patient would prefer a natural death as opposed to invasive measures such as CPR and intubation.  Family should be immediately contacted in such situations.  Critical care time: 45 minutes        [name] DNP, CCRN, FNP-C, AGACNP-BC Acute Care & Family Nurse Practitioner [facility] PCCM on call pager [phone]

## 2023-04-14 NOTE — Plan of Care (Signed)
  Problem: Education: Goal: Understanding of cardiac disease, CV risk reduction, and recovery process will improve Outcome: Progressing Goal: Individualized Educational Video(s) Outcome: Progressing   Problem: Activity: Goal: Ability to tolerate increased activity will improve Outcome: Progressing   Problem: Cardiac: Goal: Ability to achieve and maintain adequate cardiovascular perfusion will improve Outcome: Progressing   Problem: Health Behavior/Discharge Planning: Goal: Ability to safely manage health-related needs after discharge will improve Outcome: Progressing   Problem: Education: Goal: Understanding of CV disease, CV risk reduction, and recovery process will improve Outcome: Progressing Goal: Individualized Educational Video(s) Outcome: Progressing   Problem: Activity: Goal: Ability to return to baseline activity level will improve Outcome: Progressing   Problem: Cardiovascular: Goal: Ability to achieve and maintain adequate cardiovascular perfusion will improve Outcome: Progressing Goal: Vascular access site(s) Level 0-1 will be maintained Outcome: Progressing   Problem: Health Behavior/Discharge Planning: Goal: Ability to safely manage health-related needs after discharge will improve Outcome: Progressing   Problem: Education: Goal: Knowledge of General Education information will improve Description: Including pain rating scale, medication(s)/side effects and non-pharmacologic comfort measures Outcome: Progressing   Problem: Health Behavior/Discharge Planning: Goal: Ability to manage health-related needs will improve Outcome: Progressing   Problem: Clinical Measurements: Goal: Ability to maintain clinical measurements within normal limits will improve Outcome: Progressing Goal: Will remain free from infection Outcome: Progressing Goal: Diagnostic test results will improve Outcome: Progressing Goal: Respiratory complications will improve Outcome:  Progressing Goal: Cardiovascular complication will be avoided Outcome: Progressing   Problem: Activity: Goal: Risk for activity intolerance will decrease Outcome: Progressing   Problem: Nutrition: Goal: Adequate nutrition will be maintained Outcome: Progressing   Problem: Coping: Goal: Level of anxiety will decrease Outcome: Progressing   Problem: Elimination: Goal: Will not experience complications related to bowel motility Outcome: Progressing Goal: Will not experience complications related to urinary retention Outcome: Progressing   Problem: Pain Management: Goal: General experience of comfort will improve Outcome: Progressing   Problem: Safety: Goal: Ability to remain free from injury will improve Outcome: Progressing   Problem: Skin Integrity: Goal: Risk for impaired skin integrity will decrease Outcome: Progressing   Pt remains intubated/sedated. IV drips per EMAR. Pt following commands and nodding/gesturing appropriately. Pt's granddaughter at bedside. Provider in at shift change to evaluate pt with plan to wean pressors per orders as tolerated.

## 2023-04-14 NOTE — Progress Notes (Signed)
Per MD, titrate Levophed by .

## 2023-04-14 NOTE — Procedures (Signed)
Arterial Catheter Insertion Procedure Note  Dawn Bruce  [id number]  07-20-1945  Date:04/14/23  Time:2:24 AM   Provider Performing: Loraine Leriche assisted by Vascular Specialist Dr. Evie Lacks at the bedside  Procedure: Insertion of Arterial Line (11914) with US guidance (78295)   Indication(s) Blood pressure monitoring and/or need for frequent ABGs  Consent Unable to obtain consent due to emergent nature of procedure.  Anesthesia None  Time Out Verified patient identification, verified procedure, site/side was marked, verified correct patient position, special equipment/implants available, medications/allergies/relevant history reviewed, required imaging and test results available.  Sterile Technique Maximal sterile technique including full sterile barrier drape, hand hygiene, sterile gown, sterile gloves, mask, hair covering, sterile ultrasound probe cover (if used).  Procedure Description Area of catheter insertion was cleaned with chlorhexidine and draped in sterile fashion. With real-time ultrasound guidance an arterial catheter was placed into the left femoral artery.  Appropriate arterial tracings confirmed on monitor.    Complications/Tolerance None; patient tolerated the procedure well.  EBL Minimal  Specimen(s) None   [name], DNP, CCRN, FNP-C, AGACNP-BC Acute Care & Family Nurse Practitioner  [facility]  See Amion for personal pager PCCM on call pager [phone] until 7 am

## 2023-04-14 NOTE — Anesthesia Postprocedure Evaluation (Signed)
Anesthesia Post Note  Patient: Fabienne Bruns  Procedure(s) Performed: Right groin exploration with repair of right external iliac artery and right circumflex artery (Right)  Patient location during evaluation: SICU Anesthesia Type: General Level of consciousness: sedated Pain management: pain level controlled Vital Signs Assessment: vitals unstable Respiratory status: patient remains intubated per anesthesia plan, respiratory function stable and patient on ventilator - see flowsheet for VS Cardiovascular status: stable and unstable Postop Assessment: no apparent nausea or vomiting Anesthetic complications: no Comments: Continues to be on epi, vaso, NE gtt with prop/fent sedation. Blood pressure is maintained. ECHO pending.   No notable events documented.   Last Vitals:  Vitals:   04/14/23 0700 04/14/23 0715  BP:    Pulse:  (!) 109  Resp: 14 14  Temp:    SpO2:  99%    Last Pain:  Vitals:   04/14/23 0315  TempSrc: Axillary  PainSc:                  Foye Deer

## 2023-04-14 NOTE — Progress Notes (Signed)
  Echocardiogram 2D Echocardiogram has been performed.  Dawn Bruce 04/14/2023, 2:30 PM

## 2023-04-14 NOTE — Anesthesia Procedure Notes (Signed)
Arterial Line Insertion Start/End11/22/2024 11:50 PM, 04/13/2023 11:51 PM Performed by: Foye Deer, MD, anesthesiologist  Patient location: Pre-op. Preanesthetic checklist: patient identified, IV checked, site marked, risks and benefits discussed, surgical consent, monitors and equipment checked, pre-op evaluation, timeout performed and anesthesia consent Patient sedated Left, radial was placed Catheter size: 20 G Hand hygiene performed  and maximum sterile barriers used   Attempts: 1 Procedure performed using ultrasound guided technique. Ultrasound Notes:anatomy identified and no ultrasound evidence of intravascular and/or intraneural injection Following insertion, dressing applied. Post procedure assessment: normal and unchanged  Patient tolerated the procedure well with no immediate complications. Additional procedure comments: The left arm was scanned with an ultrasound but the distal arteries were too small. The right arm was scanned. The prior surgery on the extremity was noted but the emergent need for arterial monitoring outwayed the risk of arterial damage. The radial artery was accessed. After placement, there was a stenotic area about a cm proximal to the catheter. The waveform was abnormal. .

## 2023-04-14 NOTE — Progress Notes (Signed)
PHARMACY - ANTICOAGULATION CONSULT NOTE  Pharmacy Consult for Heparin  Indication: chest pain/ACS  No Known Allergies  Patient Measurements: Height: 5\' 3"  (160 cm) Weight: 58.1 kg (128 lb) IBW/kg (Calculated) : 52.4 Heparin Dosing Weight: 61.2 kg   Vital Signs: Temp: 97.2 F (36.2 C) (11/22 1645) Temp Source: Oral (11/22 1645) BP: 95/62 (11/23 0125) Pulse Rate: 61 (11/22 2305)  Labs: Recent Labs    04/12/23 1522 04/12/23 1739 04/13/23 0147 04/13/23 0414 04/13/23 0415 04/13/23 1447 04/14/23 0044  HGB 14.0  --   --   --  14.3  --  14.1  HCT 42.4  --   --   --  43.6  --  41.6  PLT 143*  --   --   --  192  --  83*  APTT  --   --   --   --  33 81*  --   LABPROT  --   --   --   --  14.1  --   --   INR  --   --   --   --  1.1  --   --   HEPARINUNFRC  --   --   --   --  1.07*  --   --   CREATININE 1.29*  --   --   --  1.24*  --   --   TROPONINIHS 27* 609* 979* 1,954*  --   --   --    Estimated Creatinine Clearance: 30.4 mL/min (A) (by C-G formula based on SCr of 1.24 mg/dL (H)).  Medical History: Past Medical History:  Diagnosis Date   Actinic keratosis    Chronic pain    GERD (gastroesophageal reflux disease) ?   Taking Pepcid   History of basal cell carcinoma (BCC) 05/02/2021   right upper back paraspinal   History of SCC (squamous cell carcinoma) of skin 09/10/2019   left dorsum wrist ED&C done 10/28/19   History of SCC (squamous cell carcinoma) of skin 03/04/2020   right dorsum hand   History of squamous cell carcinoma in situ (SCCIS) 03/04/2020   left dorsum wrist  ED&C   History of squamous cell carcinoma in situ (SCCIS) 03/04/2020   right medial infraorbital  ED&C 04/28/2020   History of squamous cell carcinoma in situ (SCCIS) 10/28/2019   right dorsum hand proximal lateral   History of squamous cell carcinoma in situ (SCCIS) 10/28/2019   right dorsum hand proximal medial   Hyperlipidemia    Hypertension    NSTEMI (non-ST elevated myocardial infarction)  (HCC) 04/12/2023   Renal disorder    Squamous cell carcinoma in situ 10/31/2021   left distal  tricep, EDC   Squamous cell carcinoma of skin 08/28/2022   Right volar forearm - EDC   Assessment: Pharmacy consulted to dose heparin in this 77 year old female admitted with ACS/NSTEMI.  Pt was on Eliquis 2.5 mg PO BID PTA, last dose on 11/21 AM. CrCl = 29.3 ml/min   Goal of Therapy:  Heparin level 0.3-0.7 units/ml aPTT 66 - 102 seconds Monitor platelets by anticoagulation protocol: Yes  Labs: 11/22  0415  aPTT 31 / HL 1.07 - subtherapeutic  11/22  1447  aPTT 81 - therapeutic x 1   Plan:  11/23:  Heparin gtt held 11/22 @ 1830 for surgery.   Dr Darrold Junker does not want to restart heparin.   Tayden Nichelson D, PharmD 04/14/2023 2:15 AM

## 2023-04-14 NOTE — Transfer of Care (Signed)
Immediate Anesthesia Transfer of Care Note  Patient: Dawn Bruce  Procedure(s) Performed: Right groin exploration with repair of right external iliac artery and right circumflex artery (Right)  Patient Location: ICU  Anesthesia Type:General  Level of Consciousness: Patient remains intubated per anesthesia plan  Airway & Oxygen Therapy: Patient placed on Ventilator (see vital sign flow sheet for setting)  Post-op Assessment: Report given to RN and Post -op Vital signs reviewed and stable  Post vital signs: Reviewed and stable  Last Vitals:  Vitals Value Taken Time  BP 95/62 04/14/23 0125  Temp    Pulse 25 04/14/23 0124  Resp 14 04/14/23 0128  SpO2 98 % 04/14/23 0125  Vitals shown include unfiled device data.  Last Pain:  Vitals:   04/13/23 2106  TempSrc:   PainSc: 0-No pain         Complications: No notable events documented.

## 2023-04-14 NOTE — Progress Notes (Signed)
1 Day Post-Op   Subjective/Chief Complaint: Remains Intubated, sedated on pressors- however, stable at this time. HgB stable.   Objective: Vital signs in last 24 hours: Temp:  [96.5 F (35.8 C)-98.4 F (36.9 C)] 97.8 F (36.6 C) (11/23 0900) Pulse Rate:  [25-123] 95 (11/23 0900) Resp:  [5-37] 14 (11/23 0900) BP: (53-150)/(35-95) 92/63 (11/23 0900) SpO2:  [79 %-100 %] 100 % (11/23 1040) Arterial Line BP: (62-151)/(47-96) 136/59 (11/23 0900) FiO2 (%):  [40 %-100 %] 40 % (11/23 1040) Weight:  [58.1 kg] 58.1 kg (11/22 1645) Last BM Date :  (Pre-op)  Intake/Output from previous day: 11/22 0701 - 11/23 0700 In: 3213.2 [I.V.:2273.2; Blood:640; IV Piggyback:300] Out: 800 [Urine:800] Intake/Output this shift: Total I/O In: 463 [I.V.:48.7; IV Piggyback:414.3] Out: 120 [Urine:120]  General appearance: Intubated/sedated GI: soft, non distended Extremities: RIGHT groin Prevena in place, soft, foot warm, +DP/PT  Lab Results:  Recent Labs    04/14/23 0044 04/14/23 0542  WBC 8.8 13.3*  HGB 14.1 13.3  HCT 41.6 39.4  PLT 83* 105*   BMET Recent Labs    04/13/23 0415 04/14/23 0542  NA 142 140  K 3.9 4.7  CL 107 109  CO2 21* 18*  GLUCOSE 124* 182*  BUN 24* 28*  CREATININE 1.24* 1.63*  CALCIUM 9.3 6.8*   PT/INR Recent Labs    04/13/23 0415  LABPROT 14.1  INR 1.1   ABG Recent Labs    04/14/23 0001 04/14/23 0549  PHART 7.17* 7.24*  HCO3 11.7* 14.6*    Studies/Results: DG Abd 1 View  Result Date: 04/14/2023 CLINICAL DATA:  Orogastric tube placement EXAM: ABDOMEN - 1 VIEW COMPARISON:  None Available. FINDINGS: Orogastric tube tip seen within the proximal body of the stomach with the proximal side hole in the region of the gastroesophageal junction. Nonobstructive bowel gas pattern. No gross free intraperitoneal gas. No organomegaly. Retained contrast opacifies the renal cortices bilaterally related to CT arteriogram performed 04/13/2023. Degenerative changes noted  within the lumbar spine. IMPRESSION: 1. Orogastric tube tip within the proximal body of the stomach. Advancement of the catheter by 5-10 cm may more optimally position the device. Electronically Signed   By: Helyn Numbers M.D.   On: 04/14/2023 02:28   DG Chest Port 1 View  Result Date: 04/14/2023 CLINICAL DATA:  Chest tube placement EXAM: PORTABLE CHEST 1 VIEW COMPARISON:  04/13/2023 FINDINGS: Endotracheal tube is seen 2.2 cm above the carina. Nasogastric tube extends into the upper abdomen beyond margin of the examination. Right internal jugular central venous catheter is in place with its tip at the superior cavoatrial junction. Lung volumes are small with mild elevation of the right hemidiaphragm. Retrocardiac opacity persists in keeping with atelectasis or infiltrate within this region. Lungs are otherwise clear. No pneumothorax or pleural effusion. Cardiac size within normal limits. Pulmonary vascularity is normal. No acute bone abnormality. No chest tube identified within the visualized thorax. IMPRESSION: 1. Support tubes in appropriate position. 2. No chest tube identified within the visualized thorax. No pneumothorax. 3. Persistent retrocardiac atelectasis or infiltrate. Electronically Signed   By: Helyn Numbers M.D.   On: 04/14/2023 02:26   CARDIAC CATHETERIZATION  Result Date: 04/13/2023   Mid LAD lesion is 30% stenosed.   Ost LAD to Prox LAD lesion is 99% stenosed.   A drug-eluting stent was successfully placed using a STENT ONYX FRONTIER 3.5X12.   Post intervention, there is a 0% residual stenosis.   There is mild left ventricular systolic dysfunction.   The  left ventricular ejection fraction is 45-50% by visual estimate. 1.  High-grade in-stent restenosis ostial LAD 2.  Successful PCI with 3.5 x 12 mm Onyx frontier DES 3.  Right groin hematoma Recommendations 1.  Dual antiplatelet therapy uninterrupted 1 year 2.  Abdominal CT to rule out retroperitoneal bleed 3.  Further recommendations  pending abdominal CT results   CT Angio Abd/Pel w/ and/or w/o  Result Date: 04/13/2023 CLINICAL DATA:  Suspected retroperitoneal hemorrhage from cath lab EXAM: CTA ABDOMEN AND PELVIS WITHOUT AND WITH CONTRAST TECHNIQUE: Multidetector CT imaging of the abdomen and pelvis was performed using the standard protocol during bolus administration of intravenous contrast. Multiplanar reconstructed images and MIPs were obtained and reviewed to evaluate the vascular anatomy. RADIATION DOSE REDUCTION: This exam was performed according to the departmental dose-optimization program which includes automated exposure control, adjustment of the mA and/or kV according to patient size and/or use of iterative reconstruction technique. CONTRAST:  80mL OMNIPAQUE IOHEXOL 350 MG/ML SOLN COMPARISON:  CT abdomen pelvis, 12/13/2021 FINDINGS: VASCULAR Large right-sided retroperitoneal hematoma arising in the vicinity of the right external iliac artery and extending into the right paracolic gutter, dimensions at least 18.6 x 7.7 x 6.2 cm (series 15, image 44, series 10, image 139). Brisk arterial extravasation arising in the vicinity of the right inferior epigastric artery origin and distal portion of the right external iliac artery (series 10, image 161, series 15, image 44). Normal contour and caliber of the abdominal aorta. No evidence of aortic aneurysm, dissection, or other acute aortic pathology. 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 Moderate mixed calcific atherosclerosis. Review of the MIP images confirms the above findings. NON-VASCULAR Lower Chest: Moderate left, small right pleural effusions and associated atelectasis or consolidation. Bilateral breast implants small hiatal hernia. Hepatobiliary: No solid liver abnormality is seen. No gallstones, gallbladder wall thickening, or biliary dilatation. Pancreas: Unremarkable. No pancreatic  ductal dilatation or surrounding inflammatory changes. Spleen: Normal in size without significant abnormality. Adrenals/Urinary Tract: Adrenal glands are unremarkable. Extensive, lobulated bilateral renal cortical scarring. No calculi or hydronephrosis. Excreted contrast within the collecting systems and bladder following catheterization. Foley catheter in the bladder. Stomach/Bowel: Stomach is within normal limits. Appendix appears normal. No evidence of bowel wall thickening, distention, or inflammatory changes. Sigmoid diverticula. Lymphatic: No enlarged abdominal or pelvic lymph nodes. Reproductive: No mass or other significant abnormality. Other: No abdominal wall hernia or abnormality. Small volume hemoperitoneum about the liver and in the pelvis (series 10, image 156, series 5, image 17). Musculoskeletal: No acute osseous findings. IMPRESSION: 1. Large right-sided retroperitoneal hematoma arising in the vicinity of the right external iliac artery and extending into the right paracolic gutter, dimensions at least 18.6 x 7.7 x 6.2 cm. 2. Brisk arterial extravasation arising in the vicinity of the right inferior epigastric artery origin and distal portion of the right external iliac artery. 3. Small volume hemoperitoneum about the liver and in the pelvis. 4. Moderate left, small right pleural effusions and associated atelectasis or consolidation. These results were called by telephone at the time of interpretation on 04/13/2023 at 9:25 pm to [facility] PARASCHOS , who verbally acknowledged these results. Aortic Atherosclerosis (ICD10-I70.0). Electronically Signed   By: Jearld Lesch M.D.   On: 04/13/2023 21:38   US THORACENTESIS ASP PLEURAL SPACE W/IMG GUIDE  Result Date: 04/13/2023 INDICATION: Patient with history of CAD s/p stenting, admitted with complaints of new chest pain and found to have large left pleural  effusion. Request for diagnostic and therapeutic left thoracentesis. EXAM: ULTRASOUND GUIDED  LEFT THORACENTESIS MEDICATIONS: 15 mL 1% lidocaine COMPLICATIONS: None immediate. PROCEDURE: An ultrasound guided thoracentesis was thoroughly discussed with the patient and questions answered. The benefits, risks, alternatives and complications were also discussed. The patient understands and wishes to proceed with the procedure. Written consent was obtained. Ultrasound was performed to localize and mark an adequate pocket of fluid in the left chest. The area was then prepped and draped in the normal sterile fashion. 1% Lidocaine was used for local anesthesia. Under ultrasound guidance a 6 Fr Safe-T-Centesis catheter was introduced. Thoracentesis was performed. The catheter was removed and a dressing applied. FINDINGS: A total of approximately 650 mL of clear yellow fluid was removed. Samples were sent to the laboratory as requested by the clinical team. IMPRESSION: Successful ultrasound guided left thoracentesis yielding 650 mL of pleural fluid. Performed by Lynnette Caffey, PA-C Electronically Signed   By: Olive Bass M.D.   On: 04/13/2023 14:13   DG Chest Port 1 View  Result Date: 04/13/2023 CLINICAL DATA:  [id number] Pleural effusion on left [id number]. Status post thoracentesis. EXAM: PORTABLE CHEST 1 VIEW COMPARISON:  04/12/2023. FINDINGS: There is small left pleural effusion with probable associated compressive atelectatic changes in the left lung. There is interval improvement when compared to the prior exam, status post left thoracentesis. No left pneumothorax. Biapical pleural thickening/calcifications again seen. Bilateral lung fields are otherwise clear. There is subtle blunting of right lateral costophrenic angle, which may represent trace right pleural effusion. Normal cardio-mediastinal silhouette. No acute osseous abnormalities. The soft tissues are within normal limits. IMPRESSION: *Small left pleural effusion, improved since the prior study, status post left thoracentesis. No pneumothorax.  Electronically Signed   By: Jules Schick M.D.   On: 04/13/2023 12:58   CT Angio Chest Pulmonary Embolism (PE) W or WO Contrast  Result Date: 04/12/2023 CLINICAL DATA:  Chest pain for 1 hour, hypertension EXAM: CT ANGIOGRAPHY CHEST WITH CONTRAST TECHNIQUE: Multidetector CT imaging of the chest was performed using the standard protocol during bolus administration of intravenous contrast. Multiplanar CT image reconstructions and MIPs were obtained to evaluate the vascular anatomy. RADIATION DOSE REDUCTION: This exam was performed according to the departmental dose-optimization program which includes automated exposure control, adjustment of the mA and/or kV according to patient size and/or use of iterative reconstruction technique. CONTRAST:  60mL OMNIPAQUE IOHEXOL 350 MG/ML SOLN COMPARISON:  12/28/2020, 04/12/2023 FINDINGS: Cardiovascular: This is a technically adequate evaluation of the pulmonary vasculature. No filling defects or pulmonary emboli. The heart is unremarkable without pericardial effusion. Normal caliber of the thoracic aorta. Atherosclerosis of the aorta and coronary vasculature. Mediastinum/Nodes: No enlarged mediastinal, hilar, or axillary lymph nodes. Thyroid gland, trachea, and esophagus demonstrate no significant findings. Lungs/Pleura: Large left pleural effusion, volume estimated in excess of 1 L. dependent consolidation within the left lower lobe most consistent with atelectasis. There is a trace right pleural effusion. No airspace disease or pneumothorax. Bilateral bronchial wall thickening, greatest in the lower lobes. Upper Abdomen: No acute abnormality. Musculoskeletal: No acute or destructive bony abnormalities. Reconstructed images demonstrate no additional findings. Review of the MIP images confirms the above findings. IMPRESSION: 1. No evidence of pulmonary embolus. 2. Large left pleural effusion, volume estimated in excess of 1 L. Dependent left lower lobe atelectasis. 3. Trace  right pleural effusion. 4. Bilateral bronchial wall thickening, greatest in the lower lobes, which may reflect bronchitis or reactive airway disease. No evidence of pneumonia. 5. Aortic Atherosclerosis (ICD10-I70.0).  Coronary artery atherosclerosis. Electronically Signed   By: Sharlet Salina M.D.   On: 04/12/2023 23:46   DG Chest 2 View  Result Date: 04/12/2023 CLINICAL DATA:  Chest pain.  Hypertension. EXAM: CHEST - 2 VIEW COMPARISON:  10/06/2021 FINDINGS: Midline trachea. Mild cardiomegaly. Increase in small to moderate left pleural effusion. Biapical pleural thickening. No pneumothorax. No congestive failure. Worsened left lower lobe airspace disease. IMPRESSION: Increase in small to moderate left pleural effusion. Progressive adjacent left base airspace disease which could represent atelectasis or infection. Cardiomegaly without congestive failure. Electronically Signed   By: Jeronimo Greaves M.D.   On: 04/12/2023 16:58    Anti-infectives: Anti-infectives (From admission, onward)    None       Assessment/Plan: s/p Procedure(s): Right groin exploration with repair of right external iliac artery and right circumflex artery (Right) Continue supportive care HgB stable- no evidence of continued hemorrhage and adequate Right lower extremity perfusion Will follow along  LOS: 2 days    Eli Hose A 04/14/2023

## 2023-04-14 NOTE — Plan of Care (Signed)
  Problem: Education: Goal: Understanding of cardiac disease, CV risk reduction, and recovery process will improve Outcome: Not Progressing Goal: Individualized Educational Video(s) Outcome: Not Progressing   Problem: Activity: Goal: Ability to tolerate increased activity will improve Outcome: Not Progressing   Problem: Cardiac: Goal: Ability to achieve and maintain adequate cardiovascular perfusion will improve Outcome: Not Progressing   Problem: Health Behavior/Discharge Planning: Goal: Ability to safely manage health-related needs after discharge will improve Outcome: Not Progressing   Problem: Education: Goal: Understanding of CV disease, CV risk reduction, and recovery process will improve Outcome: Not Progressing Goal: Individualized Educational Video(s) Outcome: Not Progressing   Problem: Activity: Goal: Ability to return to baseline activity level will improve Outcome: Not Progressing   Problem: Cardiovascular: Goal: Ability to achieve and maintain adequate cardiovascular perfusion will improve Outcome: Not Progressing Goal: Vascular access site(s) Level 0-1 will be maintained Outcome: Not Progressing   Problem: Health Behavior/Discharge Planning: Goal: Ability to safely manage health-related needs after discharge will improve Outcome: Not Progressing   Problem: Education: Goal: Knowledge of General Education information will improve Description: Including pain rating scale, medication(s)/side effects and non-pharmacologic comfort measures Outcome: Not Progressing   Problem: Health Behavior/Discharge Planning: Goal: Ability to manage health-related needs will improve Outcome: Not Progressing   Problem: Clinical Measurements: Goal: Ability to maintain clinical measurements within normal limits will improve Outcome: Not Progressing Goal: Will remain free from infection Outcome: Not Progressing Goal: Diagnostic test results will improve Outcome: Not  Progressing Goal: Respiratory complications will improve Outcome: Not Progressing Goal: Cardiovascular complication will be avoided Outcome: Not Progressing   Problem: Activity: Goal: Risk for activity intolerance will decrease Outcome: Not Progressing   Problem: Nutrition: Goal: Adequate nutrition will be maintained Outcome: Not Progressing   Problem: Coping: Goal: Level of anxiety will decrease Outcome: Not Progressing   Problem: Elimination: Goal: Will not experience complications related to bowel motility Outcome: Not Progressing Goal: Will not experience complications related to urinary retention Outcome: Not Progressing   Problem: Pain Management: Goal: General experience of comfort will improve Outcome: Not Progressing   Problem: Safety: Goal: Ability to remain free from injury will improve Outcome: Not Progressing   Problem: Skin Integrity: Goal: Risk for impaired skin integrity will decrease Outcome: Not Progressing

## 2023-04-15 ENCOUNTER — Inpatient Hospital Stay: Payer: Medicare Other

## 2023-04-15 LAB — CBC WITH DIFFERENTIAL/PLATELET
Abs Immature Granulocytes: 0.05 10*3/uL (ref 0.00–0.07)
Basophils Absolute: 0 10*3/uL (ref 0.0–0.1)
Basophils Relative: 0 %
Eosinophils Absolute: 0 10*3/uL (ref 0.0–0.5)
Eosinophils Relative: 0 %
HCT: 27.1 % — ABNORMAL LOW (ref 36.0–46.0)
Hemoglobin: 9.5 g/dL — ABNORMAL LOW (ref 12.0–15.0)
Immature Granulocytes: 0 %
Lymphocytes Relative: 9 %
Lymphs Abs: 1.1 10*3/uL (ref 0.7–4.0)
MCH: 29.7 pg (ref 26.0–34.0)
MCHC: 35.1 g/dL (ref 30.0–36.0)
MCV: 84.7 fL (ref 80.0–100.0)
Monocytes Absolute: 0.8 10*3/uL (ref 0.1–1.0)
Monocytes Relative: 6 %
Neutro Abs: 10 10*3/uL — ABNORMAL HIGH (ref 1.7–7.7)
Neutrophils Relative %: 85 %
Platelets: 81 10*3/uL — ABNORMAL LOW (ref 150–400)
RBC: 3.2 MIL/uL — ABNORMAL LOW (ref 3.87–5.11)
RDW: 14.9 % (ref 11.5–15.5)
WBC: 11.9 10*3/uL — ABNORMAL HIGH (ref 4.0–10.5)
nRBC: 0.4 % — ABNORMAL HIGH (ref 0.0–0.2)

## 2023-04-15 LAB — COMPREHENSIVE METABOLIC PANEL
ALT: 477 U/L — ABNORMAL HIGH (ref 0–44)
AST: 909 U/L — ABNORMAL HIGH (ref 15–41)
Albumin: 2.4 g/dL — ABNORMAL LOW (ref 3.5–5.0)
Alkaline Phosphatase: 41 U/L (ref 38–126)
Anion gap: 10 (ref 5–15)
BUN: 39 mg/dL — ABNORMAL HIGH (ref 8–23)
CO2: 26 mmol/L (ref 22–32)
Calcium: 6.9 mg/dL — ABNORMAL LOW (ref 8.9–10.3)
Chloride: 104 mmol/L (ref 98–111)
Creatinine, Ser: 2.06 mg/dL — ABNORMAL HIGH (ref 0.44–1.00)
GFR, Estimated: 24 mL/min — ABNORMAL LOW (ref >60)
Glucose, Bld: 112 mg/dL — ABNORMAL HIGH (ref 70–99)
Potassium: 3.2 mmol/L — ABNORMAL LOW (ref 3.5–5.1)
Sodium: 140 mmol/L (ref 135–145)
Total Bilirubin: 1 mg/dL (ref <1.2)
Total Protein: 3.9 g/dL — ABNORMAL LOW (ref 6.5–8.1)

## 2023-04-15 LAB — MAGNESIUM
Magnesium: 1.4 mg/dL — ABNORMAL LOW (ref 1.7–2.4)
Magnesium: 2 mg/dL (ref 1.7–2.4)

## 2023-04-15 LAB — PREPARE RBC (CROSSMATCH)

## 2023-04-15 LAB — HEMOGLOBIN AND HEMATOCRIT, BLOOD
HCT: 22.5 % — ABNORMAL LOW (ref 36.0–46.0)
Hemoglobin: 7.9 g/dL — ABNORMAL LOW (ref 12.0–15.0)

## 2023-04-15 LAB — POTASSIUM: Potassium: 3.5 mmol/L (ref 3.5–5.1)

## 2023-04-15 LAB — PHOSPHORUS: Phosphorus: 4.6 mg/dL (ref 2.5–4.6)

## 2023-04-15 LAB — TRIGLYCERIDES: Triglycerides: 75 mg/dL (ref <150)

## 2023-04-15 MED ORDER — EZETIMIBE 10 MG PO TABS
10.0000 mg | ORAL_TABLET | Freq: Every day | ORAL | Status: DC
Start: 2023-04-15 — End: 2023-04-19
  Administered 2023-04-15 – 2023-04-18 (×4): 10 mg
  Filled 2023-04-15 (×4): qty 1

## 2023-04-15 MED ORDER — PROPOFOL 1000 MG/100ML IV EMUL: 5.0000 ug/kg/min | INTRAVENOUS | Status: DC

## 2023-04-15 MED ORDER — LACTATED RINGERS IV BOLUS
1000.0000 mL | Freq: Once | INTRAVENOUS | Status: AC
  Administered 2023-04-15: 1000 mL via INTRAVENOUS

## 2023-04-15 MED ORDER — MIDODRINE HCL 5 MG PO TABS
15.0000 mg | ORAL_TABLET | Freq: Three times a day (TID) | ORAL | Status: DC
  Administered 2023-04-15: 15 mg
  Filled 2023-04-15: qty 3

## 2023-04-15 MED ORDER — ADULT MULTIVITAMIN W/MINERALS CH
1.0000 | ORAL_TABLET | Freq: Every day | ORAL | Status: DC
  Administered 2023-04-16 – 2023-04-20 (×5): 1 via ORAL
  Filled 2023-04-15 (×5): qty 1

## 2023-04-15 MED ORDER — SODIUM CHLORIDE 0.9% IV SOLUTION: Freq: Once | INTRAVENOUS | Status: DC

## 2023-04-15 MED ORDER — POTASSIUM CHLORIDE 10 MEQ/50ML IV SOLN
10.0000 meq | INTRAVENOUS | Status: AC
  Administered 2023-04-15 (×3): 10 meq via INTRAVENOUS
  Filled 2023-04-15 (×3): qty 50

## 2023-04-15 MED ORDER — ALBUMIN HUMAN 25 % IV SOLN
25.0000 g | Freq: Two times a day (BID) | INTRAVENOUS | Status: DC
  Administered 2023-04-15: 12.5 g via INTRAVENOUS
  Filled 2023-04-15: qty 100

## 2023-04-15 MED ORDER — SODIUM CHLORIDE 0.9% FLUSH: 10.0000 mL | INTRAVENOUS | Status: DC | PRN

## 2023-04-15 MED ORDER — POLYVINYL ALCOHOL 1.4 % OP SOLN
1.0000 [drp] | OPHTHALMIC | Status: DC | PRN
  Administered 2023-04-15: 1 [drp] via OPHTHALMIC
  Filled 2023-04-15: qty 15

## 2023-04-15 MED ORDER — ALBUMIN HUMAN 25 % IV SOLN: 25.0000 g | Freq: Two times a day (BID) | INTRAVENOUS | Status: DC

## 2023-04-15 MED ORDER — MAGNESIUM SULFATE 2 GM/50ML IV SOLN
2.0000 g | Freq: Once | INTRAVENOUS | Status: AC
  Administered 2023-04-15: 2 g via INTRAVENOUS
  Filled 2023-04-15: qty 50

## 2023-04-15 MED ORDER — MIDODRINE HCL 5 MG PO TABS
15.0000 mg | ORAL_TABLET | Freq: Three times a day (TID) | ORAL | Status: DC
  Administered 2023-04-15: 15 mg via ORAL
  Filled 2023-04-15 (×2): qty 3

## 2023-04-15 MED ORDER — ATORVASTATIN CALCIUM 20 MG PO TABS
20.0000 mg | ORAL_TABLET | Freq: Every day | ORAL | Status: DC
  Filled 2023-04-15: qty 1

## 2023-04-15 MED ORDER — LACTATED RINGERS IV BOLUS
500.0000 mL | Freq: Once | INTRAVENOUS | Status: AC
  Administered 2023-04-15: 500 mL via INTRAVENOUS

## 2023-04-15 MED ORDER — ATORVASTATIN CALCIUM 20 MG PO TABS
20.0000 mg | ORAL_TABLET | Freq: Every day | ORAL | Status: DC
Start: 2023-04-16 — End: 2023-04-15

## 2023-04-15 MED ORDER — FOLIC ACID 1 MG PO TABS
1.0000 mg | ORAL_TABLET | Freq: Every day | ORAL | Status: DC
  Administered 2023-04-16 – 2023-04-20 (×5): 1 mg via ORAL
  Filled 2023-04-15 (×5): qty 1

## 2023-04-15 MED ORDER — SODIUM CHLORIDE 0.9% FLUSH
10.0000 mL | Freq: Two times a day (BID) | INTRAVENOUS | Status: DC
  Administered 2023-04-15 – 2023-04-19 (×11): 10 mL

## 2023-04-15 NOTE — Progress Notes (Signed)
Pt extubated to 2L [place], with no complications, Per MD request. Patients saturations are 95% and she is in no distress.

## 2023-04-15 NOTE — Progress Notes (Signed)
NAME:  Dawn Bruce, MRN:  [id number], DOB:  24-May-1945, LOS: 3 ADMISSION DATE:  04/12/2023, CONSULTATION DATE:  04/13/23 REFERRING MD:  Marcina Millard  CHIEF COMPLAINT:  Chest Pain    HPI  77 y.o with significant PMH of Chronic Systolic Heart Failure with recovered EF (previously EF 35-40% improved to > 55%), PAF on Eliquis s/p catheter ablation (03/2021), HTN, HLD, previous tobacco use, recurrent pleural effusions (L>R) previously requiring thoracentesis, skin cancer, CAD s/p R CEA, CKD stage 3 who presented to the ED on 04/12/2023 for chest pain and elevated blood pressure   ED Course: Initial vital signs showed HR of 82 beats/minute, BP 173/86 mm Hg, the RR 18 breaths/minute, and the oxygen saturation 95% on RA and a temperature of 98.48F (36.7C).  Pertinent Labs/Diagnostics Findings: Na+/ K+: 141/3.4 Glucose: 187 BUN/Cr.:24/1.29,  WBC: Unremarkable,  BNP: 270 ,Troponin 27 @15 :30 --> 609 @17 :30, (+)D-Dimer 0.79,  EKG nonacute, CXR increase L pleural effusion, CTA no PE but (+)pleural effusion L, bronchitis/RAD but no discrete pneumonia.   Disposition: Admitted to hospitalist service and started heparin infusion. Cardiology consulted to eval for LHC.   Past Medical History  Chronic Systolic Heart Failure with recovered EF (previously EF 35-40% improved to > 55%), PAF on Eliquis s/p catheter ablation (03/2021), HTN, HLD, previous tobacco use, recurrent pleural effusions (L>R) previously requiring thoracentesis, skin cancer, CAD s/p R CEA, CKD stage 3  Significant Hospital Events   11/21: Admitted to stepdown unit under hospitalist service with NSTEMI .  11/22: S/p Left Thoracentesis . S/p LHC with LAD stenting via the right groin. Postop pt became hypotensive requiring vasopressor with right groin pain. CTA obtained and showed large right retroperitoneal hematoma along with active extravasation from the right external iliac and small vol hemoperitoneum about the liver and in  the pelvis. PCCM consulted, pt Intubated for airway protection and pt taken emergently to OR by vascular for repair. 04/15/23- Patient is off vasopressor support.  She is weaning on sedation and will have SBT today for liberation trial.  Mild thrombocytopenia.  Repeat CXR and KUB today.     Consults:  PCCM Cardiology Vascular  Procedures:  11/22: LHC with LAD stenting 11/22: Intubation 11/22: Right IJ central line 11/22: Right groin exploration with repair of the right external iliac and right lateral circumflex arteries  11/22: Left radial art line by anesthesiology discontinued 11/22: Left femoral art line  Significant Diagnostic Tests:  11/22: CTA abdomen and pelvis> IMPRESSION: 1. Large right-sided retroperitoneal hematoma arising in the vicinity of the right external iliac artery and extending into the right paracolic gutter, dimensions at least 18.6 x 7.7 x 6.2 cm. 2. Brisk arterial extravasation arising in the vicinity of the right inferior epigastric artery origin and distal portion of the right external iliac artery. 3. Small volume hemoperitoneum about the liver and in the pelvis. 4. Moderate left, small right pleural effusions and associated atelectasis or consolidation.   Micro Data:  None  Antimicrobials:  None  OBJECTIVE  Blood pressure (!) 105/52, pulse 73, temperature 97.9 F (36.6 C), temperature source Axillary, resp. rate 14, height 5\' 3"  (1.6 m), weight 54.4 kg, SpO2 91%.    Vent Mode: PRVC FiO2 (%):  [21 %-40 %] 28 % Set Rate:  [14 bmp] 14 bmp Vt Set:  [400 mL] 400 mL PEEP:  [5 cmH20] 5 cmH20 Plateau Pressure:  [12 cmH20-15 cmH20] 12 cmH20   Intake/Output Summary (Last 24 hours) at 04/15/2023 0932 Last data filed at 04/15/2023 [phone]  Gross per 24 hour  Intake 3829.07 ml  Output 702 ml  Net 3127.07 ml   Filed Weights   04/12/23 1518 04/13/23 1645 04/15/23 0400  Weight: 61.2 kg 58.1 kg 54.4 kg     Physical Examination  GENERAL:  77  year-old critically ill patient lying in the bed intubated and sedated EYES: PEERLA. No scleral icterus. Extraocular muscles intact.  HEENT: Head atraumatic, normocephalic. Oropharynx and nasopharynx clear.  NECK:  No JVD, supple  LUNGS: Decreased breath sounds bilaterally.  No use of accessory muscles of respiration.  CARDIOVASCULAR: S1, S2 normal. No murmurs, rubs, or gallops.  ABDOMEN: Soft, NTND,  EXTREMITIES: No swelling or erythema.  Capillary refill < 3 seconds in all extremities. Pulses Diminished distally on the left . Bluish discoloration of upper extremities R>L. Right groin site wound vac in place NEUROLOGIC: The patient is intubated and sedated . No focal neurological deficit appreciated. Cranial nerves are intact.  SKIN: No obvious rash, lesion, or ulcer. Warm to touch Labs/imaging that I havepersonally reviewed  (right click and "Reselect all SmartList Selections" daily)     Labs   CBC: Recent Labs  Lab 04/13/23 0415 04/14/23 0044 04/14/23 0542 04/14/23 1421 04/14/23 1621 04/15/23 0420  WBC 9.5 8.8 13.3*  --  16.9* 11.9*  NEUTROABS 8.1*  --   --   --  14.3* 10.0*  HGB 14.3 14.1 13.3 10.6* 10.4* 9.5*  HCT 43.6 41.6 39.4 30.0* 29.1* 27.1*  MCV 86.9 87.9 87.8  --  84.3 84.7  PLT 192 83* 105*  --  92* 81*    Basic Metabolic Panel: Recent Labs  Lab 04/12/23 1522 04/13/23 0415 04/14/23 0542 04/15/23 0420  NA 141 142 140 140  K 3.4* 3.9 4.7 3.2*  CL 108 107 109 104  CO2 23 21* 18* 26  GLUCOSE 187* 124* 182* 112*  BUN 24* 24* 28* 39*  CREATININE 1.29* 1.24* 1.63* 2.06*  CALCIUM 8.6* 9.3 6.8* 6.9*  MG  --  2.0  --  1.4*  PHOS  --  3.9  --  4.6   GFR: Estimated Creatinine Clearance: 18.3 mL/min (A) (by C-G formula based on SCr of 2.06 mg/dL (H)). Recent Labs  Lab 04/14/23 0044 04/14/23 0124 04/14/23 0542 04/14/23 0544 04/14/23 0839 04/14/23 1146 04/14/23 1621 04/15/23 0420  WBC 8.8  --  13.3*  --   --   --  16.9* 11.9*  LATICACIDVEN  --  4.3*  --   6.1* 5.5* 2.6*  --   --     Liver Function Tests: Recent Labs  Lab 04/12/23 1739 04/13/23 0415 04/14/23 0624 04/15/23 0420  AST 34 38 131* 909*  ALT 21 21 59* 477*  ALKPHOS 76 86 45 41  BILITOT 0.9 1.0 1.8* 1.0  PROT 7.1 6.9 4.0* 3.9*  ALBUMIN 3.8 3.6 2.5* 2.4*   No results for input(s): "LIPASE", "AMYLASE" in the last 168 hours. No results for input(s): "AMMONIA" in the last 168 hours.  ABG    Component Value Date/Time   PHART 7.41 04/14/2023 1407   PCO2ART 38 04/14/2023 1407   PO2ART 129 (H) 04/14/2023 1407   HCO3 24.1 04/14/2023 1407   ACIDBASEDEF 0.3 04/14/2023 1407   O2SAT 100 04/14/2023 1407     Coagulation Profile: Recent Labs  Lab 04/13/23 0415  INR 1.1    Cardiac Enzymes: No results for input(s): "CKTOTAL", "CKMB", "CKMBINDEX", "TROPONINI" in the last 168 hours.  HbA1C: Hgb A1c MFr Bld  Date/Time Value Ref Range Status  06/01/2022 12:00 AM 5.5 4.8 - 5.6 % Final    Comment:             Prediabetes: 5.7 - 6.4          Diabetes: >6.4          Glycemic control for adults with diabetes: <7.0   12/12/2021 02:02 PM 5.5 4.8 - 5.6 % Final    Comment:             Prediabetes: 5.7 - 6.4          Diabetes: >6.4          Glycemic control for adults with diabetes: <7.0     CBG: Recent Labs  Lab 04/13/23 2138  GLUCAP 130*   Review of Systems:   Unable to be obtained secondary to the patient's intubated and sedated status.   Past Medical History  She,  has a past medical history of Actinic keratosis, Chronic pain, GERD (gastroesophageal reflux disease) (?), History of basal cell carcinoma (BCC) (05/02/2021), History of SCC (squamous cell carcinoma) of skin (09/10/2019), History of SCC (squamous cell carcinoma) of skin (03/04/2020), History of squamous cell carcinoma in situ (SCCIS) (03/04/2020), History of squamous cell carcinoma in situ (SCCIS) (03/04/2020), History of squamous cell carcinoma in situ (SCCIS) (10/28/2019), History of squamous cell  carcinoma in situ (SCCIS) (10/28/2019), Hyperlipidemia, Hypertension, NSTEMI (non-ST elevated myocardial infarction) (HCC) (04/12/2023), Renal disorder, Squamous cell carcinoma in situ (10/31/2021), and Squamous cell carcinoma of skin (08/28/2022).   Surgical History    Past Surgical History:  Procedure Laterality Date   ABDOMINAL HYSTERECTOMY     APPENDECTOMY  April 2023   Burst appendix   AUGMENTATION MAMMAPLASTY Bilateral    25-30 years ago   CARDIOVERSION N/A 12/01/2020   Procedure: CARDIOVERSION;  Surgeon: Debbe Odea, MD;  Location: [facility] ORS;  Service: Cardiovascular;  Laterality: N/A;   CAROTID ARTERY ANGIOPLASTY Right 1995(approximate)   CAROTID ENDARTERECTOMY  2001   COSMETIC SURGERY     ELBOW SURGERY Left    FRACTURE SURGERY  2016   Arm   THORACENTESIS N/A 12/27/2020   Procedure: THORACENTESIS;  Surgeon: Josephine Igo, DO;  Location: [facility];  Service: Pulmonary;  Laterality: N/A;   TONSILLECTOMY     TUBAL LIGATION  ?     Social History   reports that she quit smoking about 34 years ago. Her smoking use included cigarettes. She started smoking about 49 years ago. She has a 3.8 pack-year smoking history. She has been exposed to tobacco smoke. She has never used smokeless tobacco. She reports current alcohol use of about 14.0 standard drinks of alcohol per week. She reports that she does not use drugs.   Family History   Her family history includes Cancer in her father, mother, and sister. There is no history of Breast cancer.   Allergies No Known Allergies   Home Medications  Prior to Admission medications   Medication Sig Start Date End Date Taking? Authorizing Provider  apixaban (ELIQUIS) 2.5 MG TABS tablet Take 2.5 mg by mouth 2 (two) times daily.   Yes [provider]  atorvastatin (LIPITOR) 80 MG tablet Take 80 mg by mouth daily.   Yes [provider]  carvedilol (COREG) 6.25 MG tablet Take 1 tablet (6.25 mg total) by mouth 2 (two)  times daily. Patient taking differently: Take 12.5 mg by mouth 2 (two) times daily. 04/27/22  Yes Bacigalupo, Marzella Schlein, MD  clopidogrel (PLAVIX) 75 MG tablet Take 1 tablet (75  mg total) by mouth daily. 04/02/23  Yes Bacigalupo, Marzella Schlein, MD  ezetimibe (ZETIA) 10 MG tablet TAKE 1 TABLET(10 MG) BY MOUTH DAILY 04/05/23  Yes Bacigalupo, Marzella Schlein, MD  famotidine (PEPCID) 40 MG tablet Take 1 tablet (40 mg total) by mouth daily. Patient taking differently: Take 40 mg by mouth daily as needed for indigestion or heartburn. 06/28/20  Yes Bacigalupo, Marzella Schlein, MD  fentaNYL (DURAGESIC) 25 MCG/HR Place 2 patches onto the skin every 3 (three) days. 04/04/23  Yes Ostwalt, Edmon Crape, PA-C  pregabalin (LYRICA) 150 MG capsule Take 1 capsule (150 mg total) by mouth 2 (two) times daily. 01/19/23  Yes Bacigalupo, Marzella Schlein, MD  traZODone (DESYREL) 50 MG tablet Take 0.5-1 tablets (25-50 mg total) by mouth at bedtime as needed for sleep. 09/21/22  Yes Bacigalupo, Marzella Schlein, MD  valsartan (DIOVAN) 160 MG tablet Take 160 mg by mouth in the morning and at bedtime. 08/30/21  Yes [provider]  Scheduled Meds:  sodium chloride   Intravenous Once   aspirin  81 mg Per Tube Daily   [START ON 04/16/2023] atorvastatin  20 mg Per Tube Daily   Chlorhexidine Gluconate Cloth  6 each Topical Daily   clopidogrel  75 mg Per Tube Q breakfast   docusate  100 mg Per Tube BID   ezetimibe  10 mg Per Tube Daily   fentaNYL (SUBLIMAZE) injection  50 mcg Intravenous Once   folic acid  1 mg Per Tube Daily   midodrine  10 mg Per Tube TID WC   multivitamin with minerals  1 tablet Per Tube Daily   ondansetron (ZOFRAN) IV  4 mg Intravenous Once   mouth rinse  15 mL Mouth Rinse Q2H   pantoprazole (PROTONIX) IV  40 mg Intravenous Daily   polyethylene glycol  17 g Per Tube Daily   sodium chloride flush  10-40 mL Intracatheter Q12H   thiamine  100 mg Per Tube Daily   Or   thiamine  100 mg Intravenous Daily   Continuous Infusions:  albumin  human 12.5 g (04/15/23 0853)   dexmedetomidine (PRECEDEX) IV infusion 0.3 mcg/kg/hr (04/15/23 0545)   fentaNYL infusion INTRAVENOUS 50 mcg/hr (04/15/23 0545)   norepinephrine (LEVOPHED) Adult infusion 2 mcg/min (04/15/23 0545)   propofol (DIPRIVAN) infusion Stopped (04/14/23 1303)   vasopressin Stopped (04/14/23 2307)   PRN Meds:.acetaminophen, fentaNYL, hydrALAZINE, midazolam, nitroGLYCERIN, ondansetron (ZOFRAN) IV, mouth rinse, sodium chloride flush  Active Hospital Problem list   See systems below  Assessment & Plan:  #Acute Blood Loss Anemia #Hemorrhagic Shock #Right retroperitoneal hematoma with active extravasation from the right external iliac with small volume hemoperitoneum  S/P emergent repair of the right external iliac and right lateral circumflex artery -Monitor for S/Sx of bleeding -Trend CBC (H&H q6h) -Vasopressors for MAP goal>65 -Transfuse for Hgb <7  (has received 4 units pRBC's so far) -Hold Eliquis -SCD's for VTE Prophylaxis (chemical ppx contraindicated) -Vascular following appreciate input  #NSTEMI #Cardiogenic shock from Profound Hemorrhagic shock #Chronic Systolic Heart Failure with recovered EF (previously EF 35-40% improved to > 55%)  #PAF on Eliquis s/p catheter ablation (03/2021  Hx: CAD s/p R CEA, HTN, HLD S/p left heart cath  with LAD stenting via the right groin POD # 1 -Trend HS Troponin until peaked -Hold GDMT for now in the setting of above -Hold heparin, aspirin and Plavix for now -Continue atorvastatin and Zetia once able to tolerate p.o. -Obtain 2D echo -Cardiology following appreciate input  #Acute Hypoxic Respiratory Failure #Intubated  for Airway Protection pre-op #Recurrent Pleural Effusion L>R s/p left thoracentesis 11/22 -full mechanical support 6-8cc/kg/Vt  -titrate FiO2, PEEP to maintain O2 sat >90%  -Lung protective ventilation  -PRN Chest X-ray & ABG -PRN and scheduled bronchodilators -SAT/SBT when appropriate  -prn  fentanyl, prop for RASS -1    #AKI on CKD stage III #Anion Gap Metabolic Acidosis due Lactic Acidosis -Monitor I&O's / urinary output -Follow BMP -Ensure adequate renal perfusion -Avoid nephrotoxic agents as able -Replace electrolytes as indicated -2 amps Bicarb & Bicarb gtt if needed -Trend Lactic   Best practice:  Diet:  NPO Pain/Anxiety/Delirium protocol (if indicated): Yes (RASS goal -1) VAP protocol (if indicated): Yes DVT prophylaxis: Contraindicated GI prophylaxis: N/A Glucose control:  SSI Yes Central venous access:  Yes, and it is still needed Arterial line:  Yes, and it is still needed Foley:  Yes, and it is still needed Mobility:  bed rest  PT consulted: N/A Last date of multidisciplinary goals of care discussion [updated daughter at the bedside] Code Status:  full code Disposition: ICU   = Goals of Care = Code Status Order: FULL  Primary Emergency Contact: Riesgo,Steve Wishes to pursue full aggressive treatment and intervention options, including CPR and intubation, but goals of care will be addressed on going with family if that should become necessary. Patient wishes to pursue ongoing treatment with relatively conservative measures (e.g., IV fluids, antibiotics), but would not wish to escalate to ICU level of care or other invasive measures. Patient wishes to pursue ongoing treatment, but concurred that if deteriorated to pulselessness, patient would prefer a natural death as opposed to invasive measures such as CPR and intubation.  Family should be immediately contacted in such situations.  Critical care provider statement:   Total critical care time: 33 minutes   Performed by: Karna Christmas MD   Critical care time was exclusive of separately billable procedures and treating other patients.   Critical care was necessary to treat or prevent imminent or life-threatening deterioration.   Critical care was time spent personally by me on the following activities:  development of treatment plan with patient and/or surrogate as well as nursing, discussions with consultants, evaluation of patient's response to treatment, examination of patient, obtaining history from patient or surrogate, ordering and performing treatments and interventions, ordering and review of laboratory studies, ordering and review of radiographic studies, pulse oximetry and re-evaluation of patient's condition.    Vida Rigger, M.D.  Pulmonary & Critical Care Medicine

## 2023-04-15 NOTE — Progress Notes (Signed)
PT Cancellation Note  Patient Details Name: Dawn Bruce MRN: [id number] DOB: May 17, 1946   Cancelled Treatment:    Reason Eval/Treat Not Completed: Fatigue/lethargy limiting ability to participate. Pt supine in bed upon entry with RN at bedside. Pt politely declining participation with therapy services secondary to significant fatigue. Agreeable for PT follow up tomorrow.   Vira Blanco, PT, DPT 2:24 PM,04/15/23 Physical Therapist -  [facility]

## 2023-04-15 NOTE — Progress Notes (Signed)
[facility] Cardiology  SUBJECTIVE: Patient intubated, but alert and awake, denies chest pain   Vitals:   04/15/23 0615 04/15/23 0630 04/15/23 0645 04/15/23 0745  BP:      Pulse: 76 72 73   Resp: 14 14 14    Temp:      TempSrc:      SpO2: 95% 95% 94% 96%  Weight:      Height:         Intake/Output Summary (Last 24 hours) at 04/15/2023 6644 Last data filed at 04/15/2023 0347 Gross per 24 hour  Intake 3888.96 ml  Output 792 ml  Net 3096.96 ml      PHYSICAL EXAM  General: Well developed, well nourished, in no acute distress HEENT:  Normocephalic and atramatic Neck:  No JVD.  Lungs: Clear bilaterally to auscultation and percussion. Heart: HRRR . Normal S1 and S2 without gallops or murmurs.  Abdomen: Bowel sounds are positive, abdomen soft and non-tender  Msk:  Back normal, normal gait. Normal strength and tone for age. Extremities: No clubbing, cyanosis or edema.   Neuro: Alert and oriented X 3. Psych:  Good affect, responds appropriately   LABS: Basic Metabolic Panel: Recent Labs    04/13/23 0415 04/14/23 0542 04/15/23 0420  NA 142 140 140  K 3.9 4.7 3.2*  CL 107 109 104  CO2 21* 18* 26  GLUCOSE 124* 182* 112*  BUN 24* 28* 39*  CREATININE 1.24* 1.63* 2.06*  CALCIUM 9.3 6.8* 6.9*  MG 2.0  --  1.4*  PHOS 3.9  --  4.6   Liver Function Tests: Recent Labs    04/14/23 0624 04/15/23 0420  AST 131* 909*  ALT 59* 477*  ALKPHOS 45 41  BILITOT 1.8* 1.0  PROT 4.0* 3.9*  ALBUMIN 2.5* 2.4*   No results for input(s): "LIPASE", "AMYLASE" in the last 72 hours. CBC: Recent Labs    04/14/23 1621 04/15/23 0420  WBC 16.9* 11.9*  NEUTROABS 14.3* 10.0*  HGB 10.4* 9.5*  HCT 29.1* 27.1*  MCV 84.3 84.7  PLT 92* 81*   Cardiac Enzymes: No results for input(s): "CKTOTAL", "CKMB", "CKMBINDEX", "TROPONINI" in the last 72 hours. BNP: Invalid input(s): "POCBNP" D-Dimer: Recent Labs    04/12/23 2147  DDIMER 0.79*   Hemoglobin A1C: No results for input(s): "HGBA1C" in  the last 72 hours. Fasting Lipid Panel: Recent Labs    04/12/23 1739 04/15/23 0420  CHOL 151  --   HDL 61  --   LDLCALC 67  --   TRIG 115 75  CHOLHDL 2.5  --    Thyroid Function Tests: Recent Labs    04/12/23 1739  TSH 3.374   Anemia Panel: No results for input(s): "VITAMINB12", "FOLATE", "FERRITIN", "TIBC", "IRON", "RETICCTPCT" in the last 72 hours.  ECHOCARDIOGRAM COMPLETE BUBBLE STUDY  Result Date: 04/14/2023    ECHOCARDIOGRAM REPORT   Patient Name:   Dawn Bruce Date of Exam: 04/14/2023 Medical Rec #:  [id number]   Height:       63.0 in Accession #:    [id number]  Weight:       128.0 lb Date of Birth:  [birth year]  BSA:          1.600 m Patient Age:    77 years    BP:           92/63 mmHg Patient Gender: F           HR:           86 bpm. Exam Location:  [facility] Procedure: 2D Echo, Cardiac Doppler, Color Doppler and Saline Contrast Bubble            Study Indications:     CHF (congestive heart failure) (HCC) [161096]  History:         Patient has prior history of Echocardiogram examinations. Risk                  Factors:Hypertension.  Sonographer:     Neysa Bonito Roar Referring Phys:  [id number] Dorian Pod HUDSON Diagnosing Phys: Marcina Millard MD IMPRESSIONS  1. Left ventricular ejection fraction, by estimation, is 25 to 30%. The left ventricle has severely decreased function. The left ventricle demonstrates regional wall motion abnormalities (see scoring diagram/findings for description). Left ventricular diastolic parameters were normal.  2. Right ventricular systolic function is normal. The right ventricular size is normal.  3. The mitral valve is normal in structure. Mild mitral valve regurgitation. No evidence of mitral stenosis.  4. The aortic valve is normal in structure. Aortic valve regurgitation is not visualized. No aortic stenosis is present.  5. The inferior vena cava is normal in size with greater than 50% respiratory variability, suggesting right atrial pressure of 3 mmHg. FINDINGS   Left Ventricle: Left ventricular ejection fraction, by estimation, is 25 to 30%. The left ventricle has severely decreased function. The left ventricle demonstrates regional wall motion abnormalities. The left ventricular internal cavity size was normal  in size. There is no left ventricular hypertrophy. Left ventricular diastolic parameters were normal.  LV Wall Scoring: The apical lateral segment, apical septal segment, and apex are akinetic. The apical anterior segment and apical inferior segment are hypokinetic. Right Ventricle: The right ventricular size is normal. No increase in right ventricular wall thickness. Right ventricular systolic function is normal. Left Atrium: Left atrial size was normal in size. Right Atrium: Right atrial size was normal in size. Pericardium: Trivial pericardial effusion is present. Mitral Valve: The mitral valve is normal in structure. Mild mitral valve regurgitation. No evidence of mitral valve stenosis. MV peak gradient, 5.5 mmHg. The mean mitral valve gradient is 3.0 mmHg. Tricuspid Valve: The tricuspid valve is normal in structure. Tricuspid valve regurgitation is not demonstrated. No evidence of tricuspid stenosis. Aortic Valve: The aortic valve is normal in structure. Aortic valve regurgitation is not visualized. No aortic stenosis is present. Aortic valve mean gradient measures 3.0 mmHg. Aortic valve peak gradient measures 4.6 mmHg. Aortic valve area, by VTI measures 1.14 cm. Pulmonic Valve: The pulmonic valve was normal in structure. Pulmonic valve regurgitation is not visualized. No evidence of pulmonic stenosis. Aorta: The aortic root is normal in size and structure. Venous: The inferior vena cava is normal in size with greater than 50% respiratory variability, suggesting right atrial pressure of 3 mmHg. IAS/Shunts: No atrial level shunt detected by color flow Doppler. Agitated saline contrast was given intravenously to evaluate for intracardiac shunting. Additional  Comments: There is a small pleural effusion in the left lateral region.  LEFT VENTRICLE PLAX 2D LVIDd:         3.90 cm     Diastology LVIDs:         3.40 cm     LV e' medial:    2.84 cm/s LV PW:         1.00 cm     LV E/e' medial:  28.2 LV IVS:        1.10 cm     LV e' lateral:   4.95 cm/s LVOT diam:  1.40 cm     LV E/e' lateral: 16.2 LV SV:         23 LV SV Index:   14 LVOT Area:     1.54 cm  LV Volumes (MOD) LV vol d, MOD A2C: 48.3 ml LV vol d, MOD A4C: 39.9 ml LV vol s, MOD A2C: 35.0 ml LV vol s, MOD A4C: 28.3 ml LV SV MOD A2C:     13.3 ml LV SV MOD A4C:     39.9 ml LV SV MOD BP:      12.4 ml RIGHT VENTRICLE RV Basal diam:  2.40 cm RV Mid diam:    2.20 cm RV S prime:     6.97 cm/s TAPSE (M-mode): 0.9 cm LEFT ATRIUM             Index        RIGHT ATRIUM          Index LA diam:        3.10 cm 1.94 cm/m   RA Area:     7.55 cm LA Vol (A2C):   39.3 ml 24.57 ml/m  RA Volume:   12.20 ml 7.63 ml/m LA Vol (A4C):   31.6 ml 19.76 ml/m LA Biplane Vol: 35.1 ml 21.94 ml/m  AORTIC VALVE                    PULMONIC VALVE AV Area (Vmax):    1.34 cm     PV Vmax:        0.85 m/s AV Area (Vmean):   1.26 cm     PV Peak grad:   2.9 mmHg AV Area (VTI):     1.14 cm     RVOT Peak grad: 2 mmHg AV Vmax:           107.00 cm/s AV Vmean:          74.200 cm/s AV VTI:            0.203 m AV Peak Grad:      4.6 mmHg AV Mean Grad:      3.0 mmHg LVOT Vmax:         93.40 cm/s LVOT Vmean:        60.600 cm/s LVOT VTI:          0.150 m LVOT/AV VTI ratio: 0.74  AORTA Ao Root diam: 1.90 cm Ao Asc diam:  2.30 cm MITRAL VALVE                TRICUSPID VALVE MV Area (PHT): 5.84 cm     TR Peak grad:   23.6 mmHg MV Area VTI:   0.95 cm     TR Vmax:        243.00 cm/s MV Peak grad:  5.5 mmHg MV Mean grad:  3.0 mmHg     SHUNTS MV Vmax:       1.17 m/s     Systemic VTI:  0.15 m MV Vmean:      80.4 cm/s    Systemic Diam: 1.40 cm MV Decel Time: 130 msec MV E velocity: 80.20 cm/s MV A velocity: 105.00 cm/s MV E/A ratio:  0.76 MV A Prime:    7.1 cm/s  Marcina Millard MD Electronically signed by Marcina Millard MD Signature Date/Time: 04/14/2023/2:42:41 PM    Final    DG Abd 1 View  Result Date: 04/14/2023 CLINICAL DATA:  Orogastric tube placement EXAM: ABDOMEN - 1 VIEW COMPARISON:  None Available. FINDINGS: Orogastric tube  tip seen within the proximal body of the stomach with the proximal side hole in the region of the gastroesophageal junction. Nonobstructive bowel gas pattern. No gross free intraperitoneal gas. No organomegaly. Retained contrast opacifies the renal cortices bilaterally related to CT arteriogram performed 04/13/2023. Degenerative changes noted within the lumbar spine. IMPRESSION: 1. Orogastric tube tip within the proximal body of the stomach. Advancement of the catheter by 5-10 cm may more optimally position the device. Electronically Signed   By: Helyn Numbers M.D.   On: 04/14/2023 02:28   DG Chest Port 1 View  Result Date: 04/14/2023 CLINICAL DATA:  Chest tube placement EXAM: PORTABLE CHEST 1 VIEW COMPARISON:  04/13/2023 FINDINGS: Endotracheal tube is seen 2.2 cm above the carina. Nasogastric tube extends into the upper abdomen beyond margin of the examination. Right internal jugular central venous catheter is in place with its tip at the superior cavoatrial junction. Lung volumes are small with mild elevation of the right hemidiaphragm. Retrocardiac opacity persists in keeping with atelectasis or infiltrate within this region. Lungs are otherwise clear. No pneumothorax or pleural effusion. Cardiac size within normal limits. Pulmonary vascularity is normal. No acute bone abnormality. No chest tube identified within the visualized thorax. IMPRESSION: 1. Support tubes in appropriate position. 2. No chest tube identified within the visualized thorax. No pneumothorax. 3. Persistent retrocardiac atelectasis or infiltrate. Electronically Signed   By: Helyn Numbers M.D.   On: 04/14/2023 02:26   CARDIAC CATHETERIZATION  Result  Date: 04/13/2023   Mid LAD lesion is 30% stenosed.   Ost LAD to Prox LAD lesion is 99% stenosed.   A drug-eluting stent was successfully placed using a STENT ONYX FRONTIER 3.5X12.   Post intervention, there is a 0% residual stenosis.   There is mild left ventricular systolic dysfunction.   The left ventricular ejection fraction is 45-50% by visual estimate. 1.  High-grade in-stent restenosis ostial LAD 2.  Successful PCI with 3.5 x 12 mm Onyx frontier DES 3.  Right groin hematoma Recommendations 1.  Dual antiplatelet therapy uninterrupted 1 year 2.  Abdominal CT to rule out retroperitoneal bleed 3.  Further recommendations pending abdominal CT results   CT Angio Abd/Pel w/ and/or w/o  Result Date: 04/13/2023 CLINICAL DATA:  Suspected retroperitoneal hemorrhage from cath lab EXAM: CTA ABDOMEN AND PELVIS WITHOUT AND WITH CONTRAST TECHNIQUE: Multidetector CT imaging of the abdomen and pelvis was performed using the standard protocol during bolus administration of intravenous contrast. Multiplanar reconstructed images and MIPs were obtained and reviewed to evaluate the vascular anatomy. RADIATION DOSE REDUCTION: This exam was performed according to the departmental dose-optimization program which includes automated exposure control, adjustment of the mA and/or kV according to patient size and/or use of iterative reconstruction technique. CONTRAST:  80mL OMNIPAQUE IOHEXOL 350 MG/ML SOLN COMPARISON:  CT abdomen pelvis, 12/13/2021 FINDINGS: VASCULAR Large right-sided retroperitoneal hematoma arising in the vicinity of the right external iliac artery and extending into the right paracolic gutter, dimensions at least 18.6 x 7.7 x 6.2 cm (series 15, image 44, series 10, image 139). Brisk arterial extravasation arising in the vicinity of the right inferior epigastric artery origin and distal portion of the right external iliac artery (series 10, image 161, series 15, image 44). Normal contour and caliber of the abdominal  aorta. No evidence of aortic aneurysm, dissection, or other acute aortic pathology. 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 Moderate mixed calcific atherosclerosis. Review of the MIP images  confirms the above findings. NON-VASCULAR Lower Chest: Moderate left, small right pleural effusions and associated atelectasis or consolidation. Bilateral breast implants small hiatal hernia. Hepatobiliary: No solid liver abnormality is seen. No gallstones, gallbladder wall thickening, or biliary dilatation. Pancreas: Unremarkable. No pancreatic ductal dilatation or surrounding inflammatory changes. Spleen: Normal in size without significant abnormality. Adrenals/Urinary Tract: Adrenal glands are unremarkable. Extensive, lobulated bilateral renal cortical scarring. No calculi or hydronephrosis. Excreted contrast within the collecting systems and bladder following catheterization. Foley catheter in the bladder. Stomach/Bowel: Stomach is within normal limits. Appendix appears normal. No evidence of bowel wall thickening, distention, or inflammatory changes. Sigmoid diverticula. Lymphatic: No enlarged abdominal or pelvic lymph nodes. Reproductive: No mass or other significant abnormality. Other: No abdominal wall hernia or abnormality. Small volume hemoperitoneum about the liver and in the pelvis (series 10, image 156, series 5, image 17). Musculoskeletal: No acute osseous findings. IMPRESSION: 1. Large right-sided retroperitoneal hematoma arising in the vicinity of the right external iliac artery and extending into the right paracolic gutter, dimensions at least 18.6 x 7.7 x 6.2 cm. 2. Brisk arterial extravasation arising in the vicinity of the right inferior epigastric artery origin and distal portion of the right external iliac artery. 3. Small volume hemoperitoneum about the liver and in the pelvis. 4. Moderate left, small right pleural  effusions and associated atelectasis or consolidation. These results were called by telephone at the time of interpretation on 04/13/2023 at 9:25 pm to [facility] Felipe Paluch , who verbally acknowledged these results. Aortic Atherosclerosis (ICD10-I70.0). Electronically Signed   By: Jearld Lesch M.D.   On: 04/13/2023 21:38   US THORACENTESIS ASP PLEURAL SPACE W/IMG GUIDE  Result Date: 04/13/2023 INDICATION: Patient with history of CAD s/p stenting, admitted with complaints of new chest pain and found to have large left pleural effusion. Request for diagnostic and therapeutic left thoracentesis. EXAM: ULTRASOUND GUIDED LEFT THORACENTESIS MEDICATIONS: 15 mL 1% lidocaine COMPLICATIONS: None immediate. PROCEDURE: An ultrasound guided thoracentesis was thoroughly discussed with the patient and questions answered. The benefits, risks, alternatives and complications were also discussed. The patient understands and wishes to proceed with the procedure. Written consent was obtained. Ultrasound was performed to localize and mark an adequate pocket of fluid in the left chest. The area was then prepped and draped in the normal sterile fashion. 1% Lidocaine was used for local anesthesia. Under ultrasound guidance a 6 Fr Safe-T-Centesis catheter was introduced. Thoracentesis was performed. The catheter was removed and a dressing applied. FINDINGS: A total of approximately 650 mL of clear yellow fluid was removed. Samples were sent to the laboratory as requested by the clinical team. IMPRESSION: Successful ultrasound guided left thoracentesis yielding 650 mL of pleural fluid. Performed by Lynnette Caffey, PA-C Electronically Signed   By: Olive Bass M.D.   On: 04/13/2023 14:13   DG Chest Port 1 View  Result Date: 04/13/2023 CLINICAL DATA:  [id number] Pleural effusion on left [id number]. Status post thoracentesis. EXAM: PORTABLE CHEST 1 VIEW COMPARISON:  04/12/2023. FINDINGS: There is small left pleural effusion with  probable associated compressive atelectatic changes in the left lung. There is interval improvement when compared to the prior exam, status post left thoracentesis. No left pneumothorax. Biapical pleural thickening/calcifications again seen. Bilateral lung fields are otherwise clear. There is subtle blunting of right lateral costophrenic angle, which may represent trace right pleural effusion. Normal cardio-mediastinal silhouette. No acute osseous abnormalities. The soft tissues are within normal limits. IMPRESSION: *Small left pleural effusion, improved since the prior study, status post left  thoracentesis. No pneumothorax. Electronically Signed   By: Jules Schick M.D.   On: 04/13/2023 12:58     Echo LVEF 30% 06/13/2022  TELEMETRY: Sinus rhythm 83 bpm:  ASSESSMENT AND PLAN:  Principal Problem:   Chest pain Active Problems:   CKD (chronic kidney disease) stage 3, GFR 30-59 ml/min (HCC)   Essential hypertension   Chronic diastolic heart failure (HCC)   NSTEMI (non-ST elevated myocardial infarction) (HCC)   Tobacco abuse    1.  NSTEMI, status post DES LAD 02/2022 at [facility], underwent cardiac catheterization 04/13/2023 which revealed grade in-stent restenosis ostial LAD, underwent successful PCI with DES ostial LAD, created by retroperitoneal hematoma 2.  Retroperitoneal hematoma following PCI requiring successful surgical repair of external iliac, remains intubated, pressors 3.  Chronic HFrEF, 2D echo pending 4.  Left pleural effusion, status post thoracentesis 5.  Chronic kidney disease stage IIIa, slight bump in creatinine   Recommendations   1.  Agree with current therapy 2.  Dual antiplatelet therapy uninterrupted 3.  Planned extubation today 4.  Resume good medical management after extubation (patient previously on carvedilol and irbesartan prior to admission)   Marcina Millard, MD, PhD, [facility] 04/15/2023 8:28 AM

## 2023-04-15 NOTE — Progress Notes (Signed)
Provider notified of low urine output. No new orders received.

## 2023-04-15 NOTE — Progress Notes (Signed)
2 Days Post-Op   Subjective/Chief Complaint: Continues to improve. Awake. Weaning vent and pressors today.   Objective: Vital signs in last 24 hours: Temp:  [97.8 F (36.6 C)-98.9 F (37.2 C)] 97.9 F (36.6 C) (11/24 0831) Pulse Rate:  [71-102] 75 (11/24 0830) Resp:  [0-22] 14 (11/24 0830) BP: (42-152)/(26-120) 105/52 (11/24 0600) SpO2:  [90 %-100 %] 90 % (11/24 0830) Arterial Line BP: (84-172)/(36-76) 111/42 (11/24 0831) FiO2 (%):  [21 %-40 %] 21 % (11/24 0745) Weight:  [54.4 kg] 54.4 kg (11/24 0400) Last BM Date : 04/14/23  Intake/Output from previous day: 11/23 0701 - 11/24 0700 In: 4363.7 [I.V.:2546.4; IV Piggyback:1817.3] Out: 792 [Urine:517; Emesis/NG output:275] Intake/Output this shift: No intake/output data recorded.  General appearance: Intubated/awake GI: soft, non-tender; bowel sounds normal; no masses,  no organomegaly Extremities: RIGHT leg warm, soft, +1 edema, Prevena in place, +DP  Lab Results:  Recent Labs    04/14/23 1621 04/15/23 0420  WBC 16.9* 11.9*  HGB 10.4* 9.5*  HCT 29.1* 27.1*  PLT 92* 81*   BMET Recent Labs    04/14/23 0542 04/15/23 0420  NA 140 140  K 4.7 3.2*  CL 109 104  CO2 18* 26  GLUCOSE 182* 112*  BUN 28* 39*  CREATININE 1.63* 2.06*  CALCIUM 6.8* 6.9*   PT/INR Recent Labs    04/13/23 0415  LABPROT 14.1  INR 1.1   ABG Recent Labs    04/14/23 0549 04/14/23 1407  PHART 7.24* 7.41  HCO3 14.6* 24.1    Studies/Results: ECHOCARDIOGRAM COMPLETE BUBBLE STUDY  Result Date: 04/14/2023    ECHOCARDIOGRAM REPORT   Patient Name:   Dawn Bruce Date of Exam: 04/14/2023 Medical Rec #:  [id number]   Height:       63.0 in Accession #:    [id number]  Weight:       128.0 lb Date of Birth:  [birth year]  BSA:          1.600 m Patient Age:    77 years    BP:           92/63 mmHg Patient Gender: F           HR:           86 bpm. Exam Location:  [facility] Procedure: 2D Echo, Cardiac Doppler, Color Doppler and Saline Contrast Bubble             Study Indications:     CHF (congestive heart failure) (HCC) [[id number]]  History:         Patient has prior history of Echocardiogram examinations. Risk                  Factors:Hypertension.  Sonographer:     Neysa Bonito Roar Referring Phys:  [id number] Dorian Pod HUDSON Diagnosing Phys: Marcina Millard MD IMPRESSIONS  1. Left ventricular ejection fraction, by estimation, is 25 to 30%. The left ventricle has severely decreased function. The left ventricle demonstrates regional wall motion abnormalities (see scoring diagram/findings for description). Left ventricular diastolic parameters were normal.  2. Right ventricular systolic function is normal. The right ventricular size is normal.  3. The mitral valve is normal in structure. Mild mitral valve regurgitation. No evidence of mitral stenosis.  4. The aortic valve is normal in structure. Aortic valve regurgitation is not visualized. No aortic stenosis is present.  5. The inferior vena cava is normal in size with greater than 50% respiratory variability, suggesting right atrial pressure of 3 mmHg. FINDINGS  Left Ventricle: Left  ventricular ejection fraction, by estimation, is 25 to 30%. The left ventricle has severely decreased function. The left ventricle demonstrates regional wall motion abnormalities. The left ventricular internal cavity size was normal  in size. There is no left ventricular hypertrophy. Left ventricular diastolic parameters were normal.  LV Wall Scoring: The apical lateral segment, apical septal segment, and apex are akinetic. The apical anterior segment and apical inferior segment are hypokinetic. Right Ventricle: The right ventricular size is normal. No increase in right ventricular wall thickness. Right ventricular systolic function is normal. Left Atrium: Left atrial size was normal in size. Right Atrium: Right atrial size was normal in size. Pericardium: Trivial pericardial effusion is present. Mitral Valve: The mitral valve is normal in  structure. Mild mitral valve regurgitation. No evidence of mitral valve stenosis. MV peak gradient, 5.5 mmHg. The mean mitral valve gradient is 3.0 mmHg. Tricuspid Valve: The tricuspid valve is normal in structure. Tricuspid valve regurgitation is not demonstrated. No evidence of tricuspid stenosis. Aortic Valve: The aortic valve is normal in structure. Aortic valve regurgitation is not visualized. No aortic stenosis is present. Aortic valve mean gradient measures 3.0 mmHg. Aortic valve peak gradient measures 4.6 mmHg. Aortic valve area, by VTI measures 1.14 cm. Pulmonic Valve: The pulmonic valve was normal in structure. Pulmonic valve regurgitation is not visualized. No evidence of pulmonic stenosis. Aorta: The aortic root is normal in size and structure. Venous: The inferior vena cava is normal in size with greater than 50% respiratory variability, suggesting right atrial pressure of 3 mmHg. IAS/Shunts: No atrial level shunt detected by color flow Doppler. Agitated saline contrast was given intravenously to evaluate for intracardiac shunting. Additional Comments: There is a small pleural effusion in the left lateral region.  LEFT VENTRICLE PLAX 2D LVIDd:         3.90 cm     Diastology LVIDs:         3.40 cm     LV e' medial:    2.84 cm/s LV PW:         1.00 cm     LV E/e' medial:  28.2 LV IVS:        1.10 cm     LV e' lateral:   4.95 cm/s LVOT diam:     1.40 cm     LV E/e' lateral: 16.2 LV SV:         23 LV SV Index:   14 LVOT Area:     1.54 cm  LV Volumes (MOD) LV vol d, MOD A2C: 48.3 ml LV vol d, MOD A4C: 39.9 ml LV vol s, MOD A2C: 35.0 ml LV vol s, MOD A4C: 28.3 ml LV SV MOD A2C:     13.3 ml LV SV MOD A4C:     39.9 ml LV SV MOD BP:      12.4 ml RIGHT VENTRICLE RV Basal diam:  2.40 cm RV Mid diam:    2.20 cm RV S prime:     6.97 cm/s TAPSE (M-mode): 0.9 cm LEFT ATRIUM             Index        RIGHT ATRIUM          Index LA diam:        3.10 cm 1.94 cm/m   RA Area:     7.55 cm LA Vol (A2C):   39.3 ml 24.57  ml/m  RA Volume:   12.20 ml 7.63 ml/m LA Vol (A4C):   31.6  ml 19.76 ml/m LA Biplane Vol: 35.1 ml 21.94 ml/m  AORTIC VALVE                    PULMONIC VALVE AV Area (Vmax):    1.34 cm     PV Vmax:        0.85 m/s AV Area (Vmean):   1.26 cm     PV Peak grad:   2.9 mmHg AV Area (VTI):     1.14 cm     RVOT Peak grad: 2 mmHg AV Vmax:           107.00 cm/s AV Vmean:          74.200 cm/s AV VTI:            0.203 m AV Peak Grad:      4.6 mmHg AV Mean Grad:      3.0 mmHg LVOT Vmax:         93.40 cm/s LVOT Vmean:        60.600 cm/s LVOT VTI:          0.150 m LVOT/AV VTI ratio: 0.74  AORTA Ao Root diam: 1.90 cm Ao Asc diam:  2.30 cm MITRAL VALVE                TRICUSPID VALVE MV Area (PHT): 5.84 cm     TR Peak grad:   23.6 mmHg MV Area VTI:   0.95 cm     TR Vmax:        243.00 cm/s MV Peak grad:  5.5 mmHg MV Mean grad:  3.0 mmHg     SHUNTS MV Vmax:       1.17 m/s     Systemic VTI:  0.15 m MV Vmean:      80.4 cm/s    Systemic Diam: 1.40 cm MV Decel Time: 130 msec MV E velocity: 80.20 cm/s MV A velocity: 105.00 cm/s MV E/A ratio:  0.76 MV A Prime:    7.1 cm/s Marcina Millard MD Electronically signed by Marcina Millard MD Signature Date/Time: 04/14/2023/2:42:41 PM    Final    DG Abd 1 View  Result Date: 04/14/2023 CLINICAL DATA:  Orogastric tube placement EXAM: ABDOMEN - 1 VIEW COMPARISON:  None Available. FINDINGS: Orogastric tube tip seen within the proximal body of the stomach with the proximal side hole in the region of the gastroesophageal junction. Nonobstructive bowel gas pattern. No gross free intraperitoneal gas. No organomegaly. Retained contrast opacifies the renal cortices bilaterally related to CT arteriogram performed 04/13/2023. Degenerative changes noted within the lumbar spine. IMPRESSION: 1. Orogastric tube tip within the proximal body of the stomach. Advancement of the catheter by 5-10 cm may more optimally position the device. Electronically Signed   By: Helyn Numbers M.D.   On:  04/14/2023 02:28   DG Chest Port 1 View  Result Date: 04/14/2023 CLINICAL DATA:  Chest tube placement EXAM: PORTABLE CHEST 1 VIEW COMPARISON:  04/13/2023 FINDINGS: Endotracheal tube is seen 2.2 cm above the carina. Nasogastric tube extends into the upper abdomen beyond margin of the examination. Right internal jugular central venous catheter is in place with its tip at the superior cavoatrial junction. Lung volumes are small with mild elevation of the right hemidiaphragm. Retrocardiac opacity persists in keeping with atelectasis or infiltrate within this region. Lungs are otherwise clear. No pneumothorax or pleural effusion. Cardiac size within normal limits. Pulmonary vascularity is normal. No acute bone abnormality. No chest tube identified within the visualized thorax.  IMPRESSION: 1. Support tubes in appropriate position. 2. No chest tube identified within the visualized thorax. No pneumothorax. 3. Persistent retrocardiac atelectasis or infiltrate. Electronically Signed   By: Helyn Numbers M.D.   On: 04/14/2023 02:26   CARDIAC CATHETERIZATION  Result Date: 04/13/2023   Mid LAD lesion is 30% stenosed.   Ost LAD to Prox LAD lesion is 99% stenosed.   A drug-eluting stent was successfully placed using a STENT ONYX FRONTIER 3.5X12.   Post intervention, there is a 0% residual stenosis.   There is mild left ventricular systolic dysfunction.   The left ventricular ejection fraction is 45-50% by visual estimate. 1.  High-grade in-stent restenosis ostial LAD 2.  Successful PCI with 3.5 x 12 mm Onyx frontier DES 3.  Right groin hematoma Recommendations 1.  Dual antiplatelet therapy uninterrupted 1 year 2.  Abdominal CT to rule out retroperitoneal bleed 3.  Further recommendations pending abdominal CT results   CT Angio Abd/Pel w/ and/or w/o  Result Date: 04/13/2023 CLINICAL DATA:  Suspected retroperitoneal hemorrhage from cath lab EXAM: CTA ABDOMEN AND PELVIS WITHOUT AND WITH CONTRAST TECHNIQUE:  Multidetector CT imaging of the abdomen and pelvis was performed using the standard protocol during bolus administration of intravenous contrast. Multiplanar reconstructed images and MIPs were obtained and reviewed to evaluate the vascular anatomy. RADIATION DOSE REDUCTION: This exam was performed according to the departmental dose-optimization program which includes automated exposure control, adjustment of the mA and/or kV according to patient size and/or use of iterative reconstruction technique. CONTRAST:  80mL OMNIPAQUE IOHEXOL 350 MG/ML SOLN COMPARISON:  CT abdomen pelvis, 12/13/2021 FINDINGS: VASCULAR Large right-sided retroperitoneal hematoma arising in the vicinity of the right external iliac artery and extending into the right paracolic gutter, dimensions at least 18.6 x 7.7 x 6.2 cm (series 15, image 44, series 10, image 139). Brisk arterial extravasation arising in the vicinity of the right inferior epigastric artery origin and distal portion of the right external iliac artery (series 10, image 161, series 15, image 44). Normal contour and caliber of the abdominal aorta. No evidence of aortic aneurysm, dissection, or other acute aortic pathology. 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 Moderate mixed calcific atherosclerosis. Review of the MIP images confirms the above findings. NON-VASCULAR Lower Chest: Moderate left, small right pleural effusions and associated atelectasis or consolidation. Bilateral breast implants small hiatal hernia. Hepatobiliary: No solid liver abnormality is seen. No gallstones, gallbladder wall thickening, or biliary dilatation. Pancreas: Unremarkable. No pancreatic ductal dilatation or surrounding inflammatory changes. Spleen: Normal in size without significant abnormality. Adrenals/Urinary Tract: Adrenal glands are unremarkable. Extensive, lobulated bilateral renal cortical scarring. No  calculi or hydronephrosis. Excreted contrast within the collecting systems and bladder following catheterization. Foley catheter in the bladder. Stomach/Bowel: Stomach is within normal limits. Appendix appears normal. No evidence of bowel wall thickening, distention, or inflammatory changes. Sigmoid diverticula. Lymphatic: No enlarged abdominal or pelvic lymph nodes. Reproductive: No mass or other significant abnormality. Other: No abdominal wall hernia or abnormality. Small volume hemoperitoneum about the liver and in the pelvis (series 10, image 156, series 5, image 17). Musculoskeletal: No acute osseous findings. IMPRESSION: 1. Large right-sided retroperitoneal hematoma arising in the vicinity of the right external iliac artery and extending into the right paracolic gutter, dimensions at least 18.6 x 7.7 x 6.2 cm. 2. Brisk arterial extravasation arising in the vicinity of the right inferior epigastric artery origin and distal portion of the right external iliac  artery. 3. Small volume hemoperitoneum about the liver and in the pelvis. 4. Moderate left, small right pleural effusions and associated atelectasis or consolidation. These results were called by telephone at the time of interpretation on 04/13/2023 at 9:25 pm to [facility] , who verbally acknowledged these results. Aortic Atherosclerosis (ICD10-I70.0). Electronically Signed   By: Jearld Lesch M.D.   On: 04/13/2023 21:38   US THORACENTESIS ASP PLEURAL SPACE W/IMG GUIDE  Result Date: 04/13/2023 INDICATION: Patient with history of CAD s/p stenting, admitted with complaints of new chest pain and found to have large left pleural effusion. Request for diagnostic and therapeutic left thoracentesis. EXAM: ULTRASOUND GUIDED LEFT THORACENTESIS MEDICATIONS: 15 mL 1% lidocaine COMPLICATIONS: None immediate. PROCEDURE: An ultrasound guided thoracentesis was thoroughly discussed with the patient and questions answered. The benefits, risks,  alternatives and complications were also discussed. The patient understands and wishes to proceed with the procedure. Written consent was obtained. Ultrasound was performed to localize and mark an adequate pocket of fluid in the left chest. The area was then prepped and draped in the normal sterile fashion. 1% Lidocaine was used for local anesthesia. Under ultrasound guidance a 6 Fr Safe-T-Centesis catheter was introduced. Thoracentesis was performed. The catheter was removed and a dressing applied. FINDINGS: A total of approximately 650 mL of clear yellow fluid was removed. Samples were sent to the laboratory as requested by the clinical team. IMPRESSION: Successful ultrasound guided left thoracentesis yielding 650 mL of pleural fluid. Performed by Lynnette Caffey, PA-C Electronically Signed   By: Olive Bass M.D.   On: 04/13/2023 14:13   DG Chest Port 1 View  Result Date: 04/13/2023 CLINICAL DATA:  [id number] Pleural effusion on left [id number]. Status post thoracentesis. EXAM: PORTABLE CHEST 1 VIEW COMPARISON:  04/12/2023. FINDINGS: There is small left pleural effusion with probable associated compressive atelectatic changes in the left lung. There is interval improvement when compared to the prior exam, status post left thoracentesis. No left pneumothorax. Biapical pleural thickening/calcifications again seen. Bilateral lung fields are otherwise clear. There is subtle blunting of right lateral costophrenic angle, which may represent trace right pleural effusion. Normal cardio-mediastinal silhouette. No acute osseous abnormalities. The soft tissues are within normal limits. IMPRESSION: *Small left pleural effusion, improved since the prior study, status post left thoracentesis. No pneumothorax. Electronically Signed   By: Jules Schick M.D.   On: 04/13/2023 12:58    Anti-infectives: Anti-infectives (From admission, onward)    None       Assessment/Plan: s/p Procedure(s): Right groin exploration  with repair of right external iliac artery and right circumflex artery (Right) Continue supportive care Wean vent and pressors HgB remains stable  LOS: 3 days    Eli Hose A 04/15/2023

## 2023-04-16 ENCOUNTER — Inpatient Hospital Stay: Payer: Medicare Other

## 2023-04-16 ENCOUNTER — Encounter: Payer: Self-pay | Admitting: Cardiology

## 2023-04-16 DIAGNOSIS — J9601 Acute respiratory failure with hypoxia: Secondary | ICD-10-CM | POA: Diagnosis not present

## 2023-04-16 DIAGNOSIS — R570 Cardiogenic shock: Secondary | ICD-10-CM | POA: Diagnosis not present

## 2023-04-16 DIAGNOSIS — I214 Non-ST elevation (NSTEMI) myocardial infarction: Secondary | ICD-10-CM | POA: Diagnosis not present

## 2023-04-16 DIAGNOSIS — D62 Acute posthemorrhagic anemia: Secondary | ICD-10-CM

## 2023-04-16 DIAGNOSIS — N179 Acute kidney failure, unspecified: Secondary | ICD-10-CM

## 2023-04-16 LAB — BPAM RBC
Blood Product Expiration Date: 202412172359
Blood Product Expiration Date: 202412172359
Blood Product Expiration Date: 202412202359
Blood Product Expiration Date: 202412202359
Blood Product Expiration Date: 202412202359
Blood Product Expiration Date: 202412202359
ISSUE DATE / TIME: 202411222234
ISSUE DATE / TIME: 202411222255
ISSUE DATE / TIME: 202411222255
ISSUE DATE / TIME: 202411241809
ISSUE DATE / TIME: 202411222234
Unit Type and Rh: 5100
Unit Type and Rh: 5100
Unit Type and Rh: 5100
Unit Type and Rh: 5100
Unit Type and Rh: 5100
Unit Type and Rh: 5100

## 2023-04-16 LAB — CBC WITH DIFFERENTIAL/PLATELET
Abs Immature Granulocytes: 0.08 10*3/uL — ABNORMAL HIGH (ref 0.00–0.07)
Abs Immature Granulocytes: 0.07 10*3/uL (ref 0.00–0.07)
Basophils Absolute: 0 10*3/uL (ref 0.0–0.1)
Basophils Absolute: 0 10*3/uL (ref 0.0–0.1)
Basophils Relative: 0 %
Basophils Relative: 0 %
Eosinophils Absolute: 0.1 10*3/uL (ref 0.0–0.5)
Eosinophils Absolute: 0.1 10*3/uL (ref 0.0–0.5)
Eosinophils Relative: 1 %
Eosinophils Relative: 1 %
HCT: 26.8 % — ABNORMAL LOW (ref 36.0–46.0)
HCT: 26.7 % — ABNORMAL LOW (ref 36.0–46.0)
Hemoglobin: 9.2 g/dL — ABNORMAL LOW (ref 12.0–15.0)
Hemoglobin: 9.1 g/dL — ABNORMAL LOW (ref 12.0–15.0)
Immature Granulocytes: 1 %
Immature Granulocytes: 1 %
Lymphocytes Relative: 8 %
Lymphocytes Relative: 7 %
Lymphs Abs: 0.8 10*3/uL (ref 0.7–4.0)
Lymphs Abs: 0.8 10*3/uL (ref 0.7–4.0)
MCH: 28.7 pg (ref 26.0–34.0)
MCH: 28.4 pg (ref 26.0–34.0)
MCHC: 34.3 g/dL (ref 30.0–36.0)
MCHC: 34.1 g/dL (ref 30.0–36.0)
MCV: 83.5 fL (ref 80.0–100.0)
MCV: 83.4 fL (ref 80.0–100.0)
Monocytes Absolute: 0.8 10*3/uL (ref 0.1–1.0)
Monocytes Absolute: 0.8 10*3/uL (ref 0.1–1.0)
Monocytes Relative: 8 %
Monocytes Relative: 8 %
Neutro Abs: 8.4 10*3/uL — ABNORMAL HIGH (ref 1.7–7.7)
Neutro Abs: 9 10*3/uL — ABNORMAL HIGH (ref 1.7–7.7)
Neutrophils Relative %: 82 %
Neutrophils Relative %: 83 %
Platelets: 65 10*3/uL — ABNORMAL LOW (ref 150–400)
Platelets: 65 10*3/uL — ABNORMAL LOW (ref 150–400)
RBC: 3.21 MIL/uL — ABNORMAL LOW (ref 3.87–5.11)
RBC: 3.2 MIL/uL — ABNORMAL LOW (ref 3.87–5.11)
RDW: 18.2 % — ABNORMAL HIGH (ref 11.5–15.5)
RDW: 18.2 % — ABNORMAL HIGH (ref 11.5–15.5)
WBC: 10.2 10*3/uL (ref 4.0–10.5)
WBC: 10.8 10*3/uL — ABNORMAL HIGH (ref 4.0–10.5)
nRBC: 0.2 % (ref 0.0–0.2)
nRBC: 0.2 % (ref 0.0–0.2)

## 2023-04-16 LAB — BODY FLUID CULTURE W GRAM STAIN: Culture: NO GROWTH

## 2023-04-16 LAB — COMPREHENSIVE METABOLIC PANEL
ALT: 325 U/L — ABNORMAL HIGH (ref 0–44)
AST: 530 U/L — ABNORMAL HIGH (ref 15–41)
Albumin: 2.8 g/dL — ABNORMAL LOW (ref 3.5–5.0)
Alkaline Phosphatase: 47 U/L (ref 38–126)
Anion gap: 10 (ref 5–15)
BUN: 43 mg/dL — ABNORMAL HIGH (ref 8–23)
CO2: 25 mmol/L (ref 22–32)
Calcium: 7.4 mg/dL — ABNORMAL LOW (ref 8.9–10.3)
Chloride: 102 mmol/L (ref 98–111)
Creatinine, Ser: 2.63 mg/dL — ABNORMAL HIGH (ref 0.44–1.00)
GFR, Estimated: 18 mL/min — ABNORMAL LOW (ref >60)
Glucose, Bld: 101 mg/dL — ABNORMAL HIGH (ref 70–99)
Potassium: 3.7 mmol/L (ref 3.5–5.1)
Sodium: 137 mmol/L (ref 135–145)
Total Bilirubin: 1.5 mg/dL — ABNORMAL HIGH (ref <1.2)
Total Protein: 4.8 g/dL — ABNORMAL LOW (ref 6.5–8.1)

## 2023-04-16 LAB — PROTIME-INR
INR: 1.2 (ref 0.8–1.2)
Prothrombin Time: 15.6 s — ABNORMAL HIGH (ref 11.4–15.2)

## 2023-04-16 LAB — PREPARE FRESH FROZEN PLASMA: Unit division: 0

## 2023-04-16 LAB — TYPE AND SCREEN
ABO/RH(D): O POS
Antibody Screen: NEGATIVE
Unit division: 0
Unit division: 0
Unit division: 0
Unit division: 0
Unit division: 0
Unit division: 0

## 2023-04-16 LAB — HEMOGLOBIN AND HEMATOCRIT, BLOOD
HCT: 26.8 % — ABNORMAL LOW (ref 36.0–46.0)
HCT: 25.4 % — ABNORMAL LOW (ref 36.0–46.0)
Hemoglobin: 9 g/dL — ABNORMAL LOW (ref 12.0–15.0)
Hemoglobin: 8.7 g/dL — ABNORMAL LOW (ref 12.0–15.0)

## 2023-04-16 LAB — PHOSPHORUS: Phosphorus: 4.4 mg/dL (ref 2.5–4.6)

## 2023-04-16 LAB — BPAM FFP
Blood Product Expiration Date: 202411292359
ISSUE DATE / TIME: 202411242245
Unit Type and Rh: 5100

## 2023-04-16 LAB — LIPOPROTEIN A (LPA): Lipoprotein (a): 54.6 nmol/L — ABNORMAL HIGH (ref <75.0)

## 2023-04-16 LAB — MAGNESIUM: Magnesium: 2.1 mg/dL (ref 1.7–2.4)

## 2023-04-16 MED ORDER — ORAL CARE MOUTH RINSE
15.0000 mL | OROMUCOSAL | Status: DC
  Administered 2023-04-16 – 2023-04-20 (×14): 15 mL via OROMUCOSAL

## 2023-04-16 MED ORDER — HYDROCODONE-ACETAMINOPHEN 5-325 MG PO TABS
1.0000 | ORAL_TABLET | Freq: Four times a day (QID) | ORAL | Status: DC | PRN
  Administered 2023-04-16 – 2023-04-18 (×6): 1 via ORAL
  Filled 2023-04-16 (×7): qty 1

## 2023-04-16 MED ORDER — CARVEDILOL 3.125 MG PO TABS
3.1250 mg | ORAL_TABLET | Freq: Two times a day (BID) | ORAL | Status: DC
  Administered 2023-04-17 – 2023-04-18 (×3): 3.125 mg via ORAL
  Filled 2023-04-16 (×4): qty 1

## 2023-04-16 MED ORDER — FENTANYL CITRATE PF 50 MCG/ML IJ SOSY
12.5000 ug | PREFILLED_SYRINGE | INTRAMUSCULAR | Status: DC | PRN
  Administered 2023-04-18: 25 ug via INTRAVENOUS
  Filled 2023-04-16 (×2): qty 1

## 2023-04-16 MED ORDER — ATORVASTATIN CALCIUM 80 MG PO TABS
80.0000 mg | ORAL_TABLET | Freq: Every day | ORAL | Status: DC
  Administered 2023-04-17 – 2023-04-20 (×4): 80 mg via ORAL
  Filled 2023-04-16: qty 1
  Filled 2023-04-16 (×2): qty 4
  Filled 2023-04-16: qty 1

## 2023-04-16 MED ORDER — HYDRALAZINE HCL 25 MG PO TABS
25.0000 mg | ORAL_TABLET | Freq: Two times a day (BID) | ORAL | Status: DC
  Administered 2023-04-16 – 2023-04-20 (×8): 25 mg via ORAL
  Filled 2023-04-16 (×8): qty 1

## 2023-04-16 MED ORDER — FENTANYL 25 MCG/HR TD PT72
1.0000 | MEDICATED_PATCH | TRANSDERMAL | Status: DC
  Administered 2023-04-16 – 2023-04-19 (×2): 1 via TRANSDERMAL
  Filled 2023-04-16 (×2): qty 1

## 2023-04-16 MED ORDER — MIDODRINE HCL 5 MG PO TABS: 5.0000 mg | ORAL_TABLET | Freq: Three times a day (TID) | ORAL | Status: DC

## 2023-04-16 MED ORDER — ORAL CARE MOUTH RINSE: 15.0000 mL | OROMUCOSAL | Status: DC | PRN

## 2023-04-16 MED ORDER — ISOSORBIDE MONONITRATE ER 30 MG PO TB24
30.0000 mg | ORAL_TABLET | Freq: Every day | ORAL | Status: DC
Start: 2023-04-16 — End: 2023-04-18
  Administered 2023-04-16 – 2023-04-17 (×2): 30 mg via ORAL
  Filled 2023-04-16 (×2): qty 1

## 2023-04-16 MED ORDER — APIXABAN 2.5 MG PO TABS
2.5000 mg | ORAL_TABLET | Freq: Two times a day (BID) | ORAL | Status: DC
  Administered 2023-04-17 – 2023-04-20 (×7): 2.5 mg via ORAL
  Filled 2023-04-16 (×7): qty 1

## 2023-04-16 NOTE — Progress Notes (Addendum)
[facility] CLINIC CARDIOLOGY PROGRESS NOTE       Patient ID: Dawn Bruce MRN: [id number] DOB/AGE: 79-May-1947 79 y.o.  Admit date: 04/12/2023 Referring Physician Dr. Sunnie Nielsen Primary Physician Bacigalupo, Marzella Schlein, MD  Primary Cardiologist Dr. Gilman Buttner ([facility]) Reason for Consultation NSTEMI  HPI: Dawn Bruce is a 77 y.o. female  with a past medical history of chronic systolic heart failure with recovered EF (previously EF 35-40% improved to > 55%), persistent atrial fibrillation on Eliquis s/p catheter ablation (03/2021), HTN, HLD, previous tobacco use, recurrent pleural effusions (L>R) previously requiring thoracentesis, skin cancer, carotid disease s/p R CEA, CKD stage 3 who presented to the ED on 04/12/2023 for chest pain. Cardiology was consulted for further evaluation.   Interval history: -Patient seen and examined this AM. Reports she is feeling well overall. -Denies any chest pain, SOB, palpitations. HR and BP remain stable. Currently on 2L [place].  -Reports R groin pain improving overall. Hgb stable.   Review of systems complete and found to be negative unless listed above    Past Medical History:  Diagnosis Date   Actinic keratosis    Chronic pain    GERD (gastroesophageal reflux disease) ?   Taking Pepcid   History of basal cell carcinoma (BCC) 05/02/2021   right upper back paraspinal   History of SCC (squamous cell carcinoma) of skin 09/10/2019   left dorsum wrist ED&C done 10/28/19   History of SCC (squamous cell carcinoma) of skin 03/04/2020   right dorsum hand   History of squamous cell carcinoma in situ (SCCIS) 03/04/2020   left dorsum wrist  ED&C   History of squamous cell carcinoma in situ (SCCIS) 03/04/2020   right medial infraorbital  ED&C 04/28/2020   History of squamous cell carcinoma in situ (SCCIS) 10/28/2019   right dorsum hand proximal lateral   History of squamous cell carcinoma in situ (SCCIS) 10/28/2019   right dorsum hand proximal medial    Hyperlipidemia    Hypertension    NSTEMI (non-ST elevated myocardial infarction) (HCC) 04/12/2023   Renal disorder    Squamous cell carcinoma in situ 10/31/2021   left distal  tricep, EDC   Squamous cell carcinoma of skin 08/28/2022   Right volar forearm - EDC    Past Surgical History:  Procedure Laterality Date   ABDOMINAL HYSTERECTOMY     APPENDECTOMY  April 2023   Burst appendix   AUGMENTATION MAMMAPLASTY Bilateral    25-30 years ago   CARDIOVERSION N/A 12/01/2020   Procedure: CARDIOVERSION;  Surgeon: Debbe Odea, MD;  Location: [facility] ORS;  Service: Cardiovascular;  Laterality: N/A;   CAROTID ARTERY ANGIOPLASTY Right 1995(approximate)   CAROTID ENDARTERECTOMY  2001   CORONARY STENT INTERVENTION N/A 04/13/2023   Procedure: CORONARY STENT INTERVENTION;  Surgeon: Marcina Millard, MD;  Location: [facility] INVASIVE CV LAB;  Service: Cardiovascular;  Laterality: N/A;   COSMETIC SURGERY     ELBOW SURGERY Left    ENDARTERECTOMY FEMORAL Right 04/13/2023   Procedure: Right groin exploration with repair of right external iliac artery and right circumflex artery;  Surgeon: Bertram Denver, MD;  Location: [facility] ORS;  Service: Vascular;  Laterality: Right;   FRACTURE SURGERY  2016   Arm   LEFT HEART CATH AND CORONARY ANGIOGRAPHY N/A 04/13/2023   Procedure: LEFT HEART CATH AND CORONARY ANGIOGRAPHY;  Surgeon: Marcina Millard, MD;  Location: [facility] INVASIVE CV LAB;  Service: Cardiovascular;  Laterality: N/A;   THORACENTESIS N/A 12/27/2020   Procedure: Alanson Puls;  Surgeon: Josephine Igo, DO;  Location: [facility] ENDOSCOPY;  Service: Pulmonary;  Laterality: N/A;   TONSILLECTOMY     TUBAL LIGATION  ?    Medications Prior to Admission  Medication Sig Dispense Refill Last Dose   apixaban (ELIQUIS) 2.5 MG TABS tablet Take 2.5 mg by mouth 2 (two) times daily.   04/12/2023   atorvastatin (LIPITOR) 80 MG tablet Take 80 mg by mouth daily.   04/12/2023   carvedilol (COREG) 6.25 MG tablet Take 1  tablet (6.25 mg total) by mouth 2 (two) times daily. (Patient taking differently: Take 12.5 mg by mouth 2 (two) times daily.) 180 tablet 3 04/12/2023   clopidogrel (PLAVIX) 75 MG tablet Take 1 tablet (75 mg total) by mouth daily. 90 tablet 1 04/12/2023   ezetimibe (ZETIA) 10 MG tablet TAKE 1 TABLET(10 MG) BY MOUTH DAILY 90 tablet 0 04/12/2023   famotidine (PEPCID) 40 MG tablet Take 1 tablet (40 mg total) by mouth daily. (Patient taking differently: Take 40 mg by mouth daily as needed for indigestion or heartburn.) 90 tablet 0 unk   fentaNYL (DURAGESIC) 25 MCG/HR Place 2 patches onto the skin every 3 (three) days. 20 patch 0 Past Week   pregabalin (LYRICA) 150 MG capsule Take 1 capsule (150 mg total) by mouth 2 (two) times daily. 180 capsule 1 04/12/2023   traZODone (DESYREL) 50 MG tablet Take 0.5-1 tablets (25-50 mg total) by mouth at bedtime as needed for sleep. 30 tablet 3 unk   valsartan (DIOVAN) 160 MG tablet Take 160 mg by mouth in the morning and at bedtime.   04/12/2023   Social History   Socioeconomic History   Marital status: Married    Spouse name: Not on file   Number of children: 3   Years of education: Not on file   Highest education level: Some college, no degree  Occupational History   Occupation: retired  Tobacco Use   Smoking status: Former    Current packs/day: 0.00    Average packs/day: 0.3 packs/day for 15.0 years (3.8 ttl pk-yrs)    Types: Cigarettes    Start date: 10    Quit date: 1990    Years since quitting: 34.9    Passive exposure: Past   Smokeless tobacco: Never   Tobacco comments:    quit around 1990-1995 (?)  Vaping Use   Vaping status: Never Used  Substance and Sexual Activity   Alcohol use: Yes    Alcohol/week: 14.0 standard drinks of alcohol    Types: 14 Standard drinks or equivalent per week    Comment: 2 scotch shots   Drug use: Never   Sexual activity: Not Currently    Birth control/protection: None  Other Topics Concern   Not on file   Social History Narrative   Not on file   Social Determinants of Health   Financial Resource Strain: Low Risk  (10/13/2022)   Overall Financial Resource Strain (CARDIA)    Difficulty of Paying Living Expenses: Not hard at all  Food Insecurity: No Food Insecurity (04/13/2023)   Hunger Vital Sign    Worried About Running Out of Food in the Last Year: Never true    Ran Out of Food in the Last Year: Never true  Transportation Needs: No Transportation Needs (04/13/2023)   PRAPARE - Administrator, Civil Service (Medical): No    Lack of Transportation (Non-Medical): No  Physical Activity: Insufficiently Active (10/13/2022)   Exercise Vital Sign    Days of Exercise per Week: 5 days  Minutes of Exercise per Session: 20 min  Stress: No Stress Concern Present (10/13/2022)   [employer] of Occupational Health - Occupational Stress Questionnaire    Feeling of Stress : Not at all  Social Connections: Unknown (10/13/2022)   Social Connection and Isolation Panel [NHANES]    Frequency of Communication with Friends and Family: More than three times a week    Frequency of Social Gatherings with Friends and Family: Once a week    Attends Religious Services: Not on Insurance claims handler of Clubs or Organizations: No    Attends Banker Meetings: Never    Marital Status: Married  Catering manager Violence: Not At Risk (04/13/2023)   Humiliation, Afraid, Rape, and Kick questionnaire    Fear of Current or Ex-Partner: No    Emotionally Abused: No    Physically Abused: No    Sexually Abused: No    Family History  Problem Relation Age of Onset   Cancer Mother    Cancer Father    Cancer Sister    Breast cancer Neg Hx      Vitals:   04/16/23 0815 04/16/23 0900 04/16/23 0915 04/16/23 0930  BP:      Pulse: 70 69 67 65  Resp: (!) 21 16 16 13   Temp:      TempSrc:      SpO2: 100% 99% 98% 98%  Weight:      Height:        PHYSICAL EXAM General: Well appearing  female, well nourished, in no acute laying at an incline in hospital bed. HEENT: Normocephalic and atraumatic. Neck: No JVD.  Lungs: Normal respiratory effort on 2L [place]. Clear bilaterally to auscultation. No wheezes, crackles, rhonchi.  Heart: HRRR. Normal S1 and S2 without gallops or murmurs.  Abdomen: Non-distended appearing.  Msk: Normal strength and tone for age. Extremities: Warm and well perfused. No clubbing, cyanosis. RUE with edema. No LE edema.  Neuro: Alert and oriented X 3. Psych: Answers questions appropriately.   Labs: Basic Metabolic Panel: Recent Labs    04/15/23 0420 04/15/23 1223 04/16/23 0356  NA 140  --  137  K 3.2* 3.5 3.7  CL 104  --  102  CO2 26  --  25  GLUCOSE 112*  --  101*  BUN 39*  --  43*  CREATININE 2.06*  --  2.63*  CALCIUM 6.9*  --  7.4*  MG 1.4* 2.0 2.1  PHOS 4.6  --  4.4   Liver Function Tests: Recent Labs    04/15/23 0420 04/16/23 0356  AST 909* 530*  ALT 477* 325*  ALKPHOS 41 47  BILITOT 1.0 1.5*  PROT 3.9* 4.8*  ALBUMIN 2.4* 2.8*   No results for input(s): "LIPASE", "AMYLASE" in the last 72 hours. CBC: Recent Labs    04/16/23 0151 04/16/23 0356  WBC 10.2 10.8*  NEUTROABS 8.4* 9.0*  HGB 9.2* 9.1*  HCT 26.8* 26.7*  MCV 83.5 83.4  PLT 65* 65*   Cardiac Enzymes: Recent Labs    04/14/23 0542  TROPONINIHS >24,000*   BNP: No results for input(s): "BNP" in the last 72 hours.  D-Dimer: No results for input(s): "DDIMER" in the last 72 hours.  Hemoglobin A1C: No results for input(s): "HGBA1C" in the last 72 hours. Fasting Lipid Panel: Recent Labs    04/15/23 0420  TRIG 75   Thyroid Function Tests: No results for input(s): "TSH", "T4TOTAL", "T3FREE", "THYROIDAB" in the last 72 hours.  Invalid input(s): "FREET3"  Anemia Panel: No results for input(s): "VITAMINB12", "FOLATE", "FERRITIN", "TIBC", "IRON", "RETICCTPCT" in the last 72 hours.   Radiology: [facility] Chest Port 1 View  Result Date: 04/16/2023 CLINICAL DATA:   77 year old female with history of pleural effusions. EXAM: PORTABLE CHEST 1 VIEW COMPARISON:  Chest x-ray 04/15/2023. FINDINGS: Patient has been extubated. Nasogastric tube seen on the prior study has been removed. Right internal jugular central venous catheter with tip terminating at the superior cavoatrial junction. Opacity at the left base which may reflect atelectasis and/or consolidation. Small left pleural effusion. Right lung appears clear. No definite right pleural effusion. No pneumothorax. No evidence of pulmonary edema. Heart size is normal. Upper mediastinal contours are within normal limits. IMPRESSION: 1. Support apparatus, as above. 2. Atelectasis and/or consolidation in the left lung base with small left pleural effusion. Electronically Signed   By: Trudie Reed M.D.   On: 04/16/2023 06:56   US RENAL  Result Date: 04/15/2023 CLINICAL DATA:  Acute renal failure. EXAM: RENAL / URINARY TRACT ULTRASOUND COMPLETE COMPARISON:  CT abdomen pelvis dated April 13, 2023. FINDINGS: Right Kidney: Renal measurements: 7.5 x 4.3 x 4.1 cm = volume: 69 mL. Echogenicity within normal limits. No mass or hydronephrosis visualized. Left Kidney: Renal measurements: 7.7 x 4.2 x 3.6 cm = volume: 61 mL. Echogenicity within normal limits. No mass or hydronephrosis visualized. Bladder: Decompressed by Foley catheter. Other: Bilateral pleural effusions. Small amount of fluid in the upper abdomen. IMPRESSION: 1. No acute abnormality.  No hydronephrosis. Electronically Signed   By: Obie Dredge M.D.   On: 04/15/2023 18:02   DG Abd 1 View  Result Date: 04/15/2023 CLINICAL DATA:  Right retroperitoneal hematoma. EXAM: ABDOMEN - 1 VIEW COMPARISON:  04/14/2023 FINDINGS: No gaseous bowel dilatation. Foley catheter noted in the urinary bladder. Persistent contrast retention in the kidneys. Skin staples overlie the right groin region. IMPRESSION: 1. Nonobstructive bowel gas pattern. 2. Persistent contrast retention in  the kidneys. Electronically Signed   By: Kennith Center M.D.   On: 04/15/2023 14:11   DG Chest Port 1 View  Result Date: 04/15/2023 CLINICAL DATA:  Endotracheal tube positioning/placement. EXAM: PORTABLE CHEST 1 VIEW COMPARISON:  Chest radiograph dated 04/14/2023. FINDINGS: The heart size and mediastinal contours are within normal limits. There is mild bibasilar atelectasis/airspace disease. No pneumothorax. An endotracheal tube terminates in the midthoracic trachea. An enteric tube enters the stomach and terminates below the field of view. IMPRESSION: Mild bibasilar atelectasis/airspace disease. Electronically Signed   By: Romona Curls M.D.   On: 04/15/2023 13:56   ECHOCARDIOGRAM COMPLETE BUBBLE STUDY  Result Date: 04/14/2023    ECHOCARDIOGRAM REPORT   Patient Name:   NADYIA HANNEKEN Date of Exam: 04/14/2023 Medical Rec #:  [id number]   Height:       63.0 in Accession #:    [id number]  Weight:       128.0 lb Date of Birth:  09-11-1945  BSA:          1.600 m Patient Age:    88 years    BP:           92/63 mmHg Patient Gender: F           HR:           86 bpm. Exam Location:  [facility] Procedure: 2D Echo, Cardiac Doppler, Color Doppler and Saline Contrast Bubble            Study Indications:     CHF (congestive heart failure) (HCC) [[id number]]  History:         Patient has prior history of Echocardiogram examinations. Risk                  Factors:Hypertension.  Sonographer:     Neysa Bonito Roar Referring Phys:  [id number] Dorian Pod Tomisha Reppucci Diagnosing Phys: Marcina Millard MD IMPRESSIONS  1. Left ventricular ejection fraction, by estimation, is 25 to 30%. The left ventricle has severely decreased function. The left ventricle demonstrates regional wall motion abnormalities (see scoring diagram/findings for description). Left ventricular diastolic parameters were normal.  2. Right ventricular systolic function is normal. The right ventricular size is normal.  3. The mitral valve is normal in structure. Mild mitral valve  regurgitation. No evidence of mitral stenosis.  4. The aortic valve is normal in structure. Aortic valve regurgitation is not visualized. No aortic stenosis is present.  5. The inferior vena cava is normal in size with greater than 50% respiratory variability, suggesting right atrial pressure of 3 mmHg. FINDINGS  Left Ventricle: Left ventricular ejection fraction, by estimation, is 25 to 30%. The left ventricle has severely decreased function. The left ventricle demonstrates regional wall motion abnormalities. The left ventricular internal cavity size was normal  in size. There is no left ventricular hypertrophy. Left ventricular diastolic parameters were normal.  LV Wall Scoring: The apical lateral segment, apical septal segment, and apex are akinetic. The apical anterior segment and apical inferior segment are hypokinetic. Right Ventricle: The right ventricular size is normal. No increase in right ventricular wall thickness. Right ventricular systolic function is normal. Left Atrium: Left atrial size was normal in size. Right Atrium: Right atrial size was normal in size. Pericardium: Trivial pericardial effusion is present. Mitral Valve: The mitral valve is normal in structure. Mild mitral valve regurgitation. No evidence of mitral valve stenosis. MV peak gradient, 5.5 mmHg. The mean mitral valve gradient is 3.0 mmHg. Tricuspid Valve: The tricuspid valve is normal in structure. Tricuspid valve regurgitation is not demonstrated. No evidence of tricuspid stenosis. Aortic Valve: The aortic valve is normal in structure. Aortic valve regurgitation is not visualized. No aortic stenosis is present. Aortic valve mean gradient measures 3.0 mmHg. Aortic valve peak gradient measures 4.6 mmHg. Aortic valve area, by VTI measures 1.14 cm. Pulmonic Valve: The pulmonic valve was normal in structure. Pulmonic valve regurgitation is not visualized. No evidence of pulmonic stenosis. Aorta: The aortic root is normal in size and  structure. Venous: The inferior vena cava is normal in size with greater than 50% respiratory variability, suggesting right atrial pressure of 3 mmHg. IAS/Shunts: No atrial level shunt detected by color flow Doppler. Agitated saline contrast was given intravenously to evaluate for intracardiac shunting. Additional Comments: There is a small pleural effusion in the left lateral region.  LEFT VENTRICLE PLAX 2D LVIDd:         3.90 cm     Diastology LVIDs:         3.40 cm     LV e' medial:    2.84 cm/s LV PW:         1.00 cm     LV E/e' medial:  28.2 LV IVS:        1.10 cm     LV e' lateral:   4.95 cm/s LVOT diam:     1.40 cm     LV E/e' lateral: 16.2 LV SV:         23 LV SV Index:   14 LVOT Area:     1.54 cm  LV Volumes (MOD) LV vol d, MOD A2C: 48.3 ml LV vol d, MOD A4C: 39.9 ml LV vol s, MOD A2C: 35.0 ml LV vol s, MOD A4C: 28.3 ml LV SV MOD A2C:     13.3 ml LV SV MOD A4C:     39.9 ml LV SV MOD BP:      12.4 ml RIGHT VENTRICLE RV Basal diam:  2.40 cm RV Mid diam:    2.20 cm RV S prime:     6.97 cm/s TAPSE (M-mode): 0.9 cm LEFT ATRIUM             Index        RIGHT ATRIUM          Index LA diam:        3.10 cm 1.94 cm/m   RA Area:     7.55 cm LA Vol (A2C):   39.3 ml 24.57 ml/m  RA Volume:   12.20 ml 7.63 ml/m LA Vol (A4C):   31.6 ml 19.76 ml/m LA Biplane Vol: 35.1 ml 21.94 ml/m  AORTIC VALVE                    PULMONIC VALVE AV Area (Vmax):    1.34 cm     PV Vmax:        0.85 m/s AV Area (Vmean):   1.26 cm     PV Peak grad:   2.9 mmHg AV Area (VTI):     1.14 cm     RVOT Peak grad: 2 mmHg AV Vmax:           107.00 cm/s AV Vmean:          74.200 cm/s AV VTI:            0.203 m AV Peak Grad:      4.6 mmHg AV Mean Grad:      3.0 mmHg LVOT Vmax:         93.40 cm/s LVOT Vmean:        60.600 cm/s LVOT VTI:          0.150 m LVOT/AV VTI ratio: 0.74  AORTA Ao Root diam: 1.90 cm Ao Asc diam:  2.30 cm MITRAL VALVE                TRICUSPID VALVE MV Area (PHT): 5.84 cm     TR Peak grad:   23.6 mmHg MV Area VTI:   0.95 cm      TR Vmax:        243.00 cm/s MV Peak grad:  5.5 mmHg MV Mean grad:  3.0 mmHg     SHUNTS MV Vmax:       1.17 m/s     Systemic VTI:  0.15 m MV Vmean:      80.4 cm/s    Systemic Diam: 1.40 cm MV Decel Time: 130 msec MV E velocity: 80.20 cm/s MV A velocity: 105.00 cm/s MV E/A ratio:  0.76 MV A Prime:    7.1 cm/s Marcina Millard MD Electronically signed by Marcina Millard MD Signature Date/Time: 04/14/2023/2:42:41 PM    Final    DG Abd 1 View  Result Date: 04/14/2023 CLINICAL DATA:  Orogastric tube placement EXAM: ABDOMEN - 1 VIEW COMPARISON:  None Available. FINDINGS: Orogastric tube tip seen within the proximal body of the stomach with the proximal side hole in the region of the gastroesophageal junction. Nonobstructive bowel gas pattern. No gross free intraperitoneal gas. No organomegaly. Retained contrast opacifies the  renal cortices bilaterally related to CT arteriogram performed 04/13/2023. Degenerative changes noted within the lumbar spine. IMPRESSION: 1. Orogastric tube tip within the proximal body of the stomach. Advancement of the catheter by 5-10 cm may more optimally position the device. Electronically Signed   By: Helyn Numbers M.D.   On: 04/14/2023 02:28   DG Chest Port 1 View  Result Date: 04/14/2023 CLINICAL DATA:  Chest tube placement EXAM: PORTABLE CHEST 1 VIEW COMPARISON:  04/13/2023 FINDINGS: Endotracheal tube is seen 2.2 cm above the carina. Nasogastric tube extends into the upper abdomen beyond margin of the examination. Right internal jugular central venous catheter is in place with its tip at the superior cavoatrial junction. Lung volumes are small with mild elevation of the right hemidiaphragm. Retrocardiac opacity persists in keeping with atelectasis or infiltrate within this region. Lungs are otherwise clear. No pneumothorax or pleural effusion. Cardiac size within normal limits. Pulmonary vascularity is normal. No acute bone abnormality. No chest tube identified within the  visualized thorax. IMPRESSION: 1. Support tubes in appropriate position. 2. No chest tube identified within the visualized thorax. No pneumothorax. 3. Persistent retrocardiac atelectasis or infiltrate. Electronically Signed   By: Helyn Numbers M.D.   On: 04/14/2023 02:26   CARDIAC CATHETERIZATION  Result Date: 04/13/2023   Mid LAD lesion is 30% stenosed.   Ost LAD to Prox LAD lesion is 99% stenosed.   A drug-eluting stent was successfully placed using a STENT ONYX FRONTIER 3.5X12.   Post intervention, there is a 0% residual stenosis.   There is mild left ventricular systolic dysfunction.   The left ventricular ejection fraction is 45-50% by visual estimate. 1.  High-grade in-stent restenosis ostial LAD 2.  Successful PCI with 3.5 x 12 mm Onyx frontier DES 3.  Right groin hematoma Recommendations 1.  Dual antiplatelet therapy uninterrupted 1 year 2.  Abdominal CT to rule out retroperitoneal bleed 3.  Further recommendations pending abdominal CT results   CT Angio Abd/Pel w/ and/or w/o  Result Date: 04/13/2023 CLINICAL DATA:  Suspected retroperitoneal hemorrhage from cath lab EXAM: CTA ABDOMEN AND PELVIS WITHOUT AND WITH CONTRAST TECHNIQUE: Multidetector CT imaging of the abdomen and pelvis was performed using the standard protocol during bolus administration of intravenous contrast. Multiplanar reconstructed images and MIPs were obtained and reviewed to evaluate the vascular anatomy. RADIATION DOSE REDUCTION: This exam was performed according to the departmental dose-optimization program which includes automated exposure control, adjustment of the mA and/or kV according to patient size and/or use of iterative reconstruction technique. CONTRAST:  80mL OMNIPAQUE IOHEXOL 350 MG/ML SOLN COMPARISON:  CT abdomen pelvis, 12/13/2021 FINDINGS: VASCULAR Large right-sided retroperitoneal hematoma arising in the vicinity of the right external iliac artery and extending into the right paracolic gutter, dimensions at  least 18.6 x 7.7 x 6.2 cm (series 15, image 44, series 10, image 139). Brisk arterial extravasation arising in the vicinity of the right inferior epigastric artery origin and distal portion of the right external iliac artery (series 10, image 161, series 15, image 44). Normal contour and caliber of the abdominal aorta. No evidence of aortic aneurysm, dissection, or other acute aortic pathology. 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 Moderate mixed calcific atherosclerosis. Review of the MIP images confirms the above findings. NON-VASCULAR Lower Chest: Moderate left, small right pleural effusions and associated atelectasis or consolidation. Bilateral breast implants small hiatal hernia. Hepatobiliary: No solid liver abnormality is seen. No gallstones, gallbladder wall thickening,  or biliary dilatation. Pancreas: Unremarkable. No pancreatic ductal dilatation or surrounding inflammatory changes. Spleen: Normal in size without significant abnormality. Adrenals/Urinary Tract: Adrenal glands are unremarkable. Extensive, lobulated bilateral renal cortical scarring. No calculi or hydronephrosis. Excreted contrast within the collecting systems and bladder following catheterization. Foley catheter in the bladder. Stomach/Bowel: Stomach is within normal limits. Appendix appears normal. No evidence of bowel wall thickening, distention, or inflammatory changes. Sigmoid diverticula. Lymphatic: No enlarged abdominal or pelvic lymph nodes. Reproductive: No mass or other significant abnormality. Other: No abdominal wall hernia or abnormality. Small volume hemoperitoneum about the liver and in the pelvis (series 10, image 156, series 5, image 17). Musculoskeletal: No acute osseous findings. IMPRESSION: 1. Large right-sided retroperitoneal hematoma arising in the vicinity of the right external iliac artery and extending into the right paracolic  gutter, dimensions at least 18.6 x 7.7 x 6.2 cm. 2. Brisk arterial extravasation arising in the vicinity of the right inferior epigastric artery origin and distal portion of the right external iliac artery. 3. Small volume hemoperitoneum about the liver and in the pelvis. 4. Moderate left, small right pleural effusions and associated atelectasis or consolidation. These results were called by telephone at the time of interpretation on 04/13/2023 at 9:25 pm to [facility] PARASCHOS , who verbally acknowledged these results. Aortic Atherosclerosis (ICD10-I70.0). Electronically Signed   By: Jearld Lesch M.D.   On: 04/13/2023 21:38   US THORACENTESIS ASP PLEURAL SPACE W/IMG GUIDE  Result Date: 04/13/2023 INDICATION: Patient with history of CAD s/p stenting, admitted with complaints of new chest pain and found to have large left pleural effusion. Request for diagnostic and therapeutic left thoracentesis. EXAM: ULTRASOUND GUIDED LEFT THORACENTESIS MEDICATIONS: 15 mL 1% lidocaine COMPLICATIONS: None immediate. PROCEDURE: An ultrasound guided thoracentesis was thoroughly discussed with the patient and questions answered. The benefits, risks, alternatives and complications were also discussed. The patient understands and wishes to proceed with the procedure. Written consent was obtained. Ultrasound was performed to localize and mark an adequate pocket of fluid in the left chest. The area was then prepped and draped in the normal sterile fashion. 1% Lidocaine was used for local anesthesia. Under ultrasound guidance a 6 Fr Safe-T-Centesis catheter was introduced. Thoracentesis was performed. The catheter was removed and a dressing applied. FINDINGS: A total of approximately 650 mL of clear yellow fluid was removed. Samples were sent to the laboratory as requested by the clinical team. IMPRESSION: Successful ultrasound guided left thoracentesis yielding 650 mL of pleural fluid. Performed by Lynnette Caffey, PA-C  Electronically Signed   By: Olive Bass M.D.   On: 04/13/2023 14:13   DG Chest Port 1 View  Result Date: 04/13/2023 CLINICAL DATA:  [id number] Pleural effusion on left [id number]. Status post thoracentesis. EXAM: PORTABLE CHEST 1 VIEW COMPARISON:  04/12/2023. FINDINGS: There is small left pleural effusion with probable associated compressive atelectatic changes in the left lung. There is interval improvement when compared to the prior exam, status post left thoracentesis. No left pneumothorax. Biapical pleural thickening/calcifications again seen. Bilateral lung fields are otherwise clear. There is subtle blunting of right lateral costophrenic angle, which may represent trace right pleural effusion. Normal cardio-mediastinal silhouette. No acute osseous abnormalities. The soft tissues are within normal limits. IMPRESSION: *Small left pleural effusion, improved since the prior study, status post left thoracentesis. No pneumothorax. Electronically Signed   By: Jules Schick M.D.   On: 04/13/2023 12:58   CT Angio Chest Pulmonary Embolism (PE) W or WO Contrast  Result Date: 04/12/2023 CLINICAL DATA:  Chest pain for 1 hour, hypertension EXAM: CT ANGIOGRAPHY CHEST WITH CONTRAST TECHNIQUE: Multidetector CT imaging of the chest was performed using the standard protocol during bolus administration of intravenous contrast. Multiplanar CT image reconstructions and MIPs were obtained to evaluate the vascular anatomy. RADIATION DOSE REDUCTION: This exam was performed according to the departmental dose-optimization program which includes automated exposure control, adjustment of the mA and/or kV according to patient size and/or use of iterative reconstruction technique. CONTRAST:  60mL OMNIPAQUE IOHEXOL 350 MG/ML SOLN COMPARISON:  12/28/2020, 04/12/2023 FINDINGS: Cardiovascular: This is a technically adequate evaluation of the pulmonary vasculature. No filling defects or pulmonary emboli. The heart is unremarkable without  pericardial effusion. Normal caliber of the thoracic aorta. Atherosclerosis of the aorta and coronary vasculature. Mediastinum/Nodes: No enlarged mediastinal, hilar, or axillary lymph nodes. Thyroid gland, trachea, and esophagus demonstrate no significant findings. Lungs/Pleura: Large left pleural effusion, volume estimated in excess of 1 L. dependent consolidation within the left lower lobe most consistent with atelectasis. There is a trace right pleural effusion. No airspace disease or pneumothorax. Bilateral bronchial wall thickening, greatest in the lower lobes. Upper Abdomen: No acute abnormality. Musculoskeletal: No acute or destructive bony abnormalities. Reconstructed images demonstrate no additional findings. Review of the MIP images confirms the above findings. IMPRESSION: 1. No evidence of pulmonary embolus. 2. Large left pleural effusion, volume estimated in excess of 1 L. Dependent left lower lobe atelectasis. 3. Trace right pleural effusion. 4. Bilateral bronchial wall thickening, greatest in the lower lobes, which may reflect bronchitis or reactive airway disease. No evidence of pneumonia. 5. Aortic Atherosclerosis (ICD10-I70.0). Coronary artery atherosclerosis. Electronically Signed   By: Sharlet Salina M.D.   On: 04/12/2023 23:46   DG Chest 2 View  Result Date: 04/12/2023 CLINICAL DATA:  Chest pain.  Hypertension. EXAM: CHEST - 2 VIEW COMPARISON:  10/06/2021 FINDINGS: Midline trachea. Mild cardiomegaly. Increase in small to moderate left pleural effusion. Biapical pleural thickening. No pneumothorax. No congestive failure. Worsened left lower lobe airspace disease. IMPRESSION: Increase in small to moderate left pleural effusion. Progressive adjacent left base airspace disease which could represent atelectasis or infection. Cardiomegaly without congestive failure. Electronically Signed   By: Jeronimo Greaves M.D.   On: 04/12/2023 16:58    ECHO as above  TELEMETRY reviewed by me 04/16/2023: sinus  rhythm rate 70s  EKG reviewed by me: sinus rhythm rate 81 bpm  Data reviewed by me 04/16/2023: last 24h vitals tele labs imaging I/O PCCM notes, vascular surgery notes  Principal Problem:   Chest pain Active Problems:   CKD (chronic kidney disease) stage 3, GFR 30-59 ml/min (HCC)   Essential hypertension   Chronic diastolic heart failure (HCC)   NSTEMI (non-ST elevated myocardial infarction) (HCC)   Tobacco abuse    ASSESSMENT AND PLAN:  Dawn Bruce is a 77 y.o. female  with a past medical history of coronary artery disease s/p DES to LAD 02/2022, chronic systolic heart failure with recovered EF (previously EF 35-40% improved to > 55%), persistent atrial fibrillation on Eliquis s/p catheter ablation (03/2021), HTN, HLD, previous tobacco use, recurrent pleural effusions (L>R) previously requiring thoracentesis, skin cancer, carotid disease s/p R CEA, CKD stage 3 who presented to the ED on 04/12/2023 for chest pain. Cardiology was consulted for further evaluation.   # Retroperitoneal hematoma Patient developed hematoma following LHC and underwent surgical repair of external iliac overnight on 11/23.  -Hgb remains stable.  -Vascular surgery following.   # NSTEMI # Coronary artery disease s/p DES to LAD  02/2022 # Chronic HFrEF Patient reports onset of chest pain yesterday morning while at home.  Blood pressure also elevated at that time.  Episode similar to priors when she required stenting.  Troponins trended 27  > 609 > 979 > 1954.  EKG with normal sinus rhythm, nonacute. Echo this admission with EF 25-30%, + WMA. LHC 11/22 with 99% ostial to prox LAD lesion s/p DES with 3.5 x 12 mm Onyx frontier.  -Continue aspirin 81 mg daily, Plavix 75 mg daily, atorvastatin 20 mg daily, Zetia 10 mg daily. Will DC aspirin in 2 weeks as below.  -Start carvedilol 3.125 mg twice daily, hydralazine 25 mg bid, and imdur 30 mg daily for GDMT given renal function elevated. Further adjustments pending BP and  renal function.   # Paroxysmal atrial fibrillation Patient with hx of PAF on chronic eliquis 2.5 mg bid.  -Restart eliquis 2.5 mg twice daily when deemed appropriate per vascular surgery.  -Will plan for triple therapy x2 weeks then discontinue aspirin.    # L pleural effusion Patient with hx of chronic recurrent L pleural effusion noted to be large on CT chest.  -S/p thoracentesis 11/22 with 650 mL removed. -Management per primary.  # Chronic kidney disease stage IIIa Cr 1.29 on admission, now elevated at 2.63.  -Nephrology consulted. -Continue to monitor closely.   This patient's plan of care was discussed and created with Dr. Juliann Pares and he is in agreement.  Signed: Gale Journey, PA-C  04/16/2023, 11:19 AM [facility]

## 2023-04-16 NOTE — Progress Notes (Addendum)
PT Cancellation Note  Patient Details Name: Dawn Bruce MRN: [id number] DOB: 1945/08/12   Cancelled Treatment:    Reason Eval/Treat Not Completed: Other (comment) (Patient continues to have femoral arterial line. PT will continue with attempts.)  Donna Bernard, PT, MPT  Ina Homes 04/16/2023, 3:31 PM

## 2023-04-16 NOTE — Discharge Instructions (Signed)
Intensive Outpatient Programs   [facility] [address] [place],  [place], [place] [phone]  [facility] (Inpatient and [phone] (Suboxone and Methadone) [address] [phone]  [facility] Programs - Intensive Outpatient [address] [place], [place] [address]  [phone]  [facility] (Outpatient, Inpatient, [facility] (Groups and Residental) (insurance only) [phone] [place], [place] [phone]   [facility] (for caregivers and family) [address] Laurell Josephs [address] [place], [place] [phone]  Residential Treatment Programs  [facility](2 years) Residential: 58 days)[facility] [address] [place], [place], [place] [phone] or [phone]  [facility] [address] [place], [place], [place] [phone]  [facility] ([facility]) [address] [place], [place], [place] [phone] Admissions: 8am-3pm M-F  [facility] Program: Residential Program [phone] Days)             [facility]: [facility]  [place], [place], [place]  [phone] or [phone] in Hours over the weekend or by referral)  [facility] [address] [place], [place] [address] [phone] (Do virtual or phone assessment, offer transportation within 25 miles, have in patient and Outpatient options)   [facility]: [facility]:[phone] (for crisis  response 24 hours a day)

## 2023-04-16 NOTE — Plan of Care (Signed)
Problem: Education: Goal: Understanding of cardiac disease, CV risk reduction, and recovery process will improve 04/16/2023 1911 by Lyndal Rainbow, RN Outcome: Progressing 04/16/2023 1910 by Lyndal Rainbow, RN Outcome: Progressing Goal: Individualized Educational Video(s) 04/16/2023 1911 by Lyndal Rainbow, RN Outcome: Progressing 04/16/2023 1910 by Lyndal Rainbow, RN Outcome: Progressing   Problem: Activity: Goal: Ability to tolerate increased activity will improve 04/16/2023 1911 by Lyndal Rainbow, RN Outcome: Progressing 04/16/2023 1910 by Lyndal Rainbow, RN Outcome: Progressing   Problem: Cardiac: Goal: Ability to achieve and maintain adequate cardiovascular perfusion will improve 04/16/2023 1911 by Lyndal Rainbow, RN Outcome: Progressing 04/16/2023 1910 by Lyndal Rainbow, RN Outcome: Progressing   Problem: Health Behavior/Discharge Planning: Goal: Ability to safely manage health-related needs after discharge will improve 04/16/2023 1911 by Lyndal Rainbow, RN Outcome: Progressing 04/16/2023 1910 by Lyndal Rainbow, RN Outcome: Progressing   Problem: Education: Goal: Understanding of CV disease, CV risk reduction, and recovery process will improve 04/16/2023 1911 by Lyndal Rainbow, RN Outcome: Progressing 04/16/2023 1910 by Lyndal Rainbow, RN Outcome: Progressing Goal: Individualized Educational Video(s) 04/16/2023 1911 by Lyndal Rainbow, RN Outcome: Progressing 04/16/2023 1910 by Lyndal Rainbow, RN Outcome: Progressing   Problem: Activity: Goal: Ability to return to baseline activity level will improve 04/16/2023 1911 by Lyndal Rainbow, RN Outcome: Progressing 04/16/2023 1910 by Lyndal Rainbow, RN Outcome: Progressing   Problem: Cardiovascular: Goal: Ability to achieve and maintain adequate cardiovascular perfusion will improve 04/16/2023 1911 by Lyndal Rainbow, RN Outcome: Progressing 04/16/2023 1910 by Lyndal Rainbow, RN Outcome: Progressing Goal: Vascular access site(s)  Level 0-1 will be maintained 04/16/2023 1911 by Lyndal Rainbow, RN Outcome: Progressing 04/16/2023 1910 by Lyndal Rainbow, RN Outcome: Progressing   Problem: Health Behavior/Discharge Planning: Goal: Ability to safely manage health-related needs after discharge will improve 04/16/2023 1911 by Lyndal Rainbow, RN Outcome: Progressing 04/16/2023 1910 by Lyndal Rainbow, RN Outcome: Progressing   Problem: Education: Goal: Knowledge of General Education information will improve Description: Including pain rating scale, medication(s)/side effects and non-pharmacologic comfort measures 04/16/2023 1911 by Lyndal Rainbow, RN Outcome: Progressing 04/16/2023 1910 by Lyndal Rainbow, RN Outcome: Progressing   Problem: Health Behavior/Discharge Planning: Goal: Ability to manage health-related needs will improve 04/16/2023 1911 by Lyndal Rainbow, RN Outcome: Progressing 04/16/2023 1910 by Lyndal Rainbow, RN Outcome: Progressing   Problem: Clinical Measurements: Goal: Ability to maintain clinical measurements within normal limits will improve 04/16/2023 1911 by Lyndal Rainbow, RN Outcome: Progressing 04/16/2023 1910 by Lyndal Rainbow, RN Outcome: Progressing Goal: Will remain free from infection 04/16/2023 1911 by Lyndal Rainbow, RN Outcome: Progressing 04/16/2023 1910 by Lyndal Rainbow, RN Outcome: Progressing Goal: Diagnostic test results will improve 04/16/2023 1911 by Lyndal Rainbow, RN Outcome: Progressing 04/16/2023 1910 by Lyndal Rainbow, RN Outcome: Progressing Goal: Respiratory complications will improve 04/16/2023 1911 by Lyndal Rainbow, RN Outcome: Progressing 04/16/2023 1910 by Lyndal Rainbow, RN Outcome: Progressing Goal: Cardiovascular complication will be avoided 04/16/2023 1911 by Lyndal Rainbow, RN Outcome: Progressing 04/16/2023 1910 by Lyndal Rainbow, RN Outcome: Progressing   Problem: Activity: Goal: Risk for activity intolerance will decrease 04/16/2023 1911 by Lyndal Rainbow, RN Outcome: Progressing 04/16/2023 1910 by Lyndal Rainbow, RN Outcome: Progressing   Problem: Nutrition: Goal: Adequate nutrition will be maintained 04/16/2023 1911 by Lyndal Rainbow, RN Outcome: Progressing 04/16/2023 1910 by Lyndal Rainbow, RN Outcome: Progressing  Problem: Coping: Goal: Level of anxiety will decrease 04/16/2023 1911 by Lyndal Rainbow, RN Outcome: Progressing 04/16/2023 1910 by Lyndal Rainbow, RN Outcome: Progressing   Problem: Elimination: Goal: Will not experience complications related to bowel motility 04/16/2023 1911 by Lyndal Rainbow, RN Outcome: Progressing 04/16/2023 1910 by Lyndal Rainbow, RN Outcome: Progressing Goal: Will not experience complications related to urinary retention 04/16/2023 1911 by Lyndal Rainbow, RN Outcome: Progressing 04/16/2023 1910 by Lyndal Rainbow, RN Outcome: Progressing   Problem: Pain Management: Goal: General experience of comfort will improve 04/16/2023 1911 by Lyndal Rainbow, RN Outcome: Progressing 04/16/2023 1910 by Lyndal Rainbow, RN Outcome: Progressing   Problem: Safety: Goal: Ability to remain free from injury will improve 04/16/2023 1911 by Lyndal Rainbow, RN Outcome: Progressing 04/16/2023 1910 by Lyndal Rainbow, RN Outcome: Progressing   Problem: Skin Integrity: Goal: Risk for impaired skin integrity will decrease 04/16/2023 1911 by Lyndal Rainbow, RN Outcome: Progressing 04/16/2023 1910 by Lyndal Rainbow, RN Outcome: Progressing

## 2023-04-16 NOTE — Progress Notes (Signed)
Hydralazine held due to SBP <100. Cheryll Cockayne, NP made aware.

## 2023-04-16 NOTE — TOC Initial Note (Signed)
Transition of Care [facility]) - Initial/Assessment Note    Patient Details  Name: Dawn Bruce MRN: [id number] Date of Birth: 01-Feb-1946  Transition of [facility]) CM/SW Contact:    Chapman Fitch, RN Phone Number: 04/16/2023, 4:20 PM  Clinical Narrative:                  PT OT eval pending Substance abuse resources added to AVS       Patient Goals and CMS Choice            Expected Discharge Plan and Services                                              Prior Living Arrangements/Services                       Activities of Daily Living   ADL Screening (condition at time of admission) Independently performs ADLs?: Yes (appropriate for developmental age) Is the patient deaf or have difficulty hearing?: No Does the patient have difficulty seeing, even when wearing glasses/contacts?: No Does the patient have difficulty concentrating, remembering, or making decisions?: No  Permission Sought/Granted                  Emotional Assessment              Admission diagnosis:  Status post thoracentesis [Z98.890] NSTEMI (non-ST elevated myocardial infarction) [facility]) [I21.4] Hypertensive emergency [I16.1] Chest pain [R07.9] Patient Active Problem List   Diagnosis Date Noted   Tobacco abuse 04/13/2023   NSTEMI (non-ST elevated myocardial infarction) (HCC) 04/12/2023   Chest pain 04/12/2023   OAB (overactive bladder) 04/27/2022   Chronic diastolic heart failure (HCC) 09/27/2021   Insomnia 09/06/2021   Perforated appendicitis 08/31/2021   Carotid artery occlusion 02/07/2021   Cerebral atherosclerosis 02/07/2021   Hypertensive heart disease without congestive heart failure 02/07/2021   Occlusion and stenosis of bilateral carotid arteries 02/07/2021   Vitamin D deficiency, unspecified 02/07/2021   Chronic anticoagulation 01/27/2021   Senile purpura (HCC) 12/03/2020   Prediabetes 10/08/2019   Stenosis of right carotid artery 10/03/2018    Hyperlipidemia 10/03/2018   Essential hypertension 10/02/2018   CKD (chronic kidney disease) stage 3, GFR 30-59 ml/min (HCC) 06/17/2018   Chronic forearm pain (Primary Area of Pain) (Left) 06/17/2018   Neurogenic pain 06/17/2018   Chronic pain syndrome 06/06/2018   Long term current use of opiate analgesic 06/06/2018   PCP:  Erasmo Downer, MD Pharmacy:   [facility] [phone] - [place], [place] - [address] [place] [address] [place] [address] Phone: [phone] Fax: [phone]  [id number] - [place], [place] - [address] AT [facility] [address] [place] [address] Phone: [phone] Fax: [phone]     Social Determinants of Health (SDOH) Social History: SDOH Screenings   Food Insecurity: No Food Insecurity (04/13/2023)  Housing: Low Risk  (04/13/2023)  Transportation Needs: No Transportation Needs (04/13/2023)  Utilities: Not At Risk (04/13/2023)  Alcohol Screen: Low Risk  (12/08/2022)  Depression (PHQ2-9): Low Risk  (12/08/2022)  Financial Resource Strain: Low Risk  (10/13/2022)  Physical Activity: Insufficiently Active (10/13/2022)  Social Connections: Unknown (10/13/2022)  Stress: No Stress Concern Present (10/13/2022)  Tobacco Use: Medium  Risk (04/13/2023)   SDOH Interventions:     Readmission Risk Interventions     No data to display

## 2023-04-16 NOTE — Progress Notes (Signed)
Progress Note    04/16/2023 3:43 PM 3 Days Post-Op  Subjective:  Dawn Bruce is a 77 yo female who presented to [facility] Emergency department with NSTEMI, underwent Cardiac cath with PCI to LAD via right femoral artery- 6Fr sheath, Mynx closure- subsequent hypotension, CTA found to have retroperitoneal hematoma with active extravasation from distal external Iliac.  Patient underwent right groin exploration with repair of the right external iliac circumflex arteries.  Patient is now postop day 3 awake alert and oriented.  She was extubated over the weekend and is off all pressors today.  Upon exam this morning patient is resting comfortably in bed.  Right groin area with Prevena wound VAC in place and working well.  Right lower extremity remains swollen with +2 to +3 edema in her right groin area extending to her knee.  Patient endorses she has not been out of bed yet or worked with physical therapy.  Patient endorses she feels much better today.  ICU team questioning when patient's Eliquis which she takes for A-fib can be restarted.  No other complaints today vitals all remained stable.  Patient's family is at the bedside.   Vitals:   04/16/23 1245 04/16/23 1300  BP: 131/63   Pulse: 68   Resp: (!) 23 18  Temp:    SpO2: 98% 98%   Physical Exam: Cardiac:  Irregular with Atrial Fibrillation. Normal S1, S2. No murmurs.  Lungs:  Decreased breath sounds bilaterally.  No use of accessory muscles of respiration.  Incisions:  Right groin with Prevena Wound Vac in place and working well.  Extremities:  Bilateral lower extremities are warm to touch. Good Capillary refill. Right Hip/Thigh area with +2-3 edema and erythema noted. Doppler pulses DP/PT bilateral.   Abdomen:  Positive bowel sounds, soft, non tender and non distended.  Neurologic: Patient is alert and oriented to person place and time.  She answers all questions and responds to commands appropriately.  CBC    Component Value Date/Time    WBC 10.8 (H) 04/16/2023 0356   RBC 3.20 (L) 04/16/2023 0356   HGB 9.0 (L) 04/16/2023 1446   HGB 13.6 06/01/2022 0000   HCT 26.8 (L) 04/16/2023 1446   HCT 40.5 06/01/2022 0000   PLT 65 (L) 04/16/2023 0356   PLT 132 (L) 06/01/2022 0000   MCV 83.4 04/16/2023 0356   MCV 90 06/01/2022 0000   MCH 28.4 04/16/2023 0356   MCHC 34.1 04/16/2023 0356   RDW 18.2 (H) 04/16/2023 0356   RDW 12.8 06/01/2022 0000   LYMPHSABS 0.8 04/16/2023 0356   LYMPHSABS 0.7 08/30/2021 0917   MONOABS 0.8 04/16/2023 0356   EOSABS 0.1 04/16/2023 0356   EOSABS 0.0 08/30/2021 0917   BASOSABS 0.0 04/16/2023 0356   BASOSABS 0.0 08/30/2021 0917    BMET    Component Value Date/Time   NA 137 04/16/2023 0356   NA 147 (H) 06/01/2022 0000   K 3.7 04/16/2023 0356   CL 102 04/16/2023 0356   CO2 25 04/16/2023 0356   GLUCOSE 101 (H) 04/16/2023 0356   BUN 43 (H) 04/16/2023 0356   BUN 23 06/01/2022 0000   CREATININE 2.63 (H) 04/16/2023 0356   CALCIUM 7.4 (L) 04/16/2023 0356   GFRNONAA 18 (L) 04/16/2023 0356   GFRAA 54 (L) 10/08/2019 1419    INR    Component Value Date/Time   INR 1.2 04/16/2023 0151     Intake/Output Summary (Last 24 hours) at 04/16/2023 1543 Last data filed at 04/16/2023 1254 Gross per 24  hour  Intake 676.89 ml  Output 313 ml  Net 363.89 ml     Assessment/Plan:  77 y.o. female is s/p right groin exploration with repair of the right external iliac circumflex arteries. 3 Days Post-Op   PLAN: Okay to restart patient's home Eliquis dose today as prior 2 days hemoglobin have remained stable. Prevena wound VAC to stay in place 7 to 10 days. Patient will follow-up post surgery in 3 weeks for staple removal from right groin.  DVT prophylaxis: ASA 81 mg daily, Plavix 75 mg daily.   [name] [facility] 04/16/2023 3:43 PM

## 2023-04-16 NOTE — Progress Notes (Signed)
PHARMACY CONSULT NOTE  Pharmacy Consult for Electrolyte Monitoring and Replacement   Recent Labs: Potassium (mmol/L)  Date Value  04/16/2023 3.7   Magnesium (mg/dL)  Date Value  16/02/9603 2.1   Calcium (mg/dL)  Date Value  54/01/8118 7.4 (L)   Albumin (g/dL)  Date Value  14/78/2956 2.8 (L)  06/01/2022 3.8   Phosphorus (mg/dL)  Date Value  21/30/8657 4.4   Sodium (mmol/L)  Date Value  04/16/2023 137  06/01/2022 147 (H)   Corrected Ca: 8.4 mg/dL  Assessment:  77 y.o. female  with a past medical history of chronic systolic heart failure with recovered EF (previously EF 35-40% improved to > 55%), persistent atrial fibrillation on Eliquis s/p catheter ablation (03/2021), HTN, HLD, previous tobacco use, recurrent pleural effusions (L>R) previously requiring thoracentesis, skin cancer, carotid disease s/p R CEA, CKD stage 3 who presented to the ED on 04/12/2023 for chest pain. Pharmacy is asked to follow and replace electrolytes while in CCU  Goal of Therapy:  Electrolytes WNL  Plan:  ---no electrolyte replacement warranted for today ---recheck electrolytes in am  Lowella Bandy ,PharmD Clinical Pharmacist 04/16/2023 7:09 AM

## 2023-04-16 NOTE — Consult Note (Signed)
[facility] Consult Note: 04/16/23     Date of Admission:  04/12/2023           Reason for Consult:  AKI   Referring Provider: Janann Colonel, MD Primary Care Provider: Erasmo Downer, MD   History of Presenting Illness:  Dawn Bruce is a 77 y.o. female with medical problems of hypertension, heart failure with preserved ejection fraction, history of pleural effusions, history of diffuse vascular disease including coronary artery disease-history of PCI in LAD stent October 2023, chronic kidney disease, prediabetes, atrial fibrillation status post cardioversion in 2022 followed by Dr. Alyson Locket at [facility] nephrology.  Patient has a baseline creatinine of 1.3/GFR 42 from 03/22/2023.  Patient underwent coronary angiography on 04/13/2023.  Procedure was complicated by groin pain and profound hypotension.  CT angiogram showed large right retroperitoneal hematoma with active bleed.  She underwent emergent right groin exploration with repair of the right external iliac and right lateral circumflex arteries on April 14, 2023.  Postprocedure, creatinine has been steadily increasing.  Admit creatinine of 1.24 on 06/12/2024 3 today. Patient had cardiogenic shock and LFTs were also elevated. Fortunately hemodynamics have stabilized and she is producing some urine.  Urine output of 326 cc yesterday.  290 cc documented today.  She has no peripheral edema.  Her rhythm is regular.  O2 saturation is 100% on room air She has a Foley catheter.    Review of Systems: Review of Systems  Constitutional: Negative.   HENT: Negative.    Eyes: Negative.   Respiratory: Negative.    Cardiovascular: Negative.   Gastrointestinal: Negative.   Genitourinary: Negative.   Musculoskeletal: Negative.   Skin: Negative.   Neurological: Negative.   Endo/Heme/Allergies: Negative.   Psychiatric/Behavioral: Negative.      Past Medical History:  Diagnosis Date   Actinic keratosis     Chronic pain    GERD (gastroesophageal reflux disease) ?   Taking Pepcid   History of basal cell carcinoma (BCC) 05/02/2021   right upper back paraspinal   History of SCC (squamous cell carcinoma) of skin 09/10/2019   left dorsum wrist ED&C done 10/28/19   History of SCC (squamous cell carcinoma) of skin 03/04/2020   right dorsum hand   History of squamous cell carcinoma in situ (SCCIS) 03/04/2020   left dorsum wrist  ED&C   History of squamous cell carcinoma in situ (SCCIS) 03/04/2020   right medial infraorbital  ED&C 04/28/2020   History of squamous cell carcinoma in situ (SCCIS) 10/28/2019   right dorsum hand proximal lateral   History of squamous cell carcinoma in situ (SCCIS) 10/28/2019   right dorsum hand proximal medial   Hyperlipidemia    Hypertension    NSTEMI (non-ST elevated myocardial infarction) (HCC) 04/12/2023   Renal disorder    Squamous cell carcinoma in situ 10/31/2021   left distal  tricep, EDC   Squamous cell carcinoma of skin 08/28/2022   Right volar forearm - EDC    Social History   Tobacco Use   Smoking status: Former    Current packs/day: 0.00    Average packs/day: 0.3 packs/day for 15.0 years (3.8 ttl pk-yrs)    Types: Cigarettes    Start date: 82    Quit date: 92    Years since quitting: 34.9    Passive exposure: Past   Smokeless tobacco: Never   Tobacco comments:    quit around 1990-1995 (?)  Vaping Use   Vaping status: Never Used  Substance Use Topics  Alcohol use: Yes    Alcohol/week: 14.0 standard drinks of alcohol    Types: 14 Standard drinks or equivalent per week    Comment: 2 scotch shots   Drug use: Never    Family History  Problem Relation Age of Onset   Cancer Mother    Cancer Father    Cancer Sister    Breast cancer Neg Hx      OBJECTIVE: Blood pressure (!) 98/44, pulse 77, temperature 98.1 F (36.7 C), temperature source Axillary, resp. rate 15, height 5\' 3"  (1.6 m), weight 54.4 kg, SpO2 94%.  Physical  Exam General Appearance-no acute distress, laying in the bed HEENT-moist oral mucous membranes Pulmonary: Room air, left basilar crackles Cardiac-regular rhythm, no rub or gallop Abdomen: Soft, nontender Extremities: No significant peripheral edema Skin: Warm, dry Foley catheter in place Neuro: Alert and oriented  Lab Results Lab Results  Component Value Date   WBC 10.8 (H) 04/16/2023   HGB 9.0 (L) 04/16/2023   HCT 26.8 (L) 04/16/2023   MCV 83.4 04/16/2023   PLT 65 (L) 04/16/2023    Lab Results  Component Value Date   CREATININE 2.63 (H) 04/16/2023   BUN 43 (H) 04/16/2023   NA 137 04/16/2023   K 3.7 04/16/2023   CL 102 04/16/2023   CO2 25 04/16/2023    Lab Results  Component Value Date   ALT 325 (H) 04/16/2023   AST 530 (H) 04/16/2023   ALKPHOS 47 04/16/2023   BILITOT 1.5 (H) 04/16/2023     Microbiology: Recent Results (from the past 240 hour(s))  Body fluid culture w Gram Stain     Status: None   Collection Time: 04/13/23 11:47 AM   Specimen: PATH Cytology Pleural fluid  Result Value Ref Range Status   Specimen Description   Final    PLEURAL Performed at [facility], [address], [place], [place] [address]    Special Requests   Final    NONE Performed at [facility], [address] [place], [place] [address]    Gram Stain   Final    WBC PRESENT, PREDOMINANTLY MONONUCLEAR NO ORGANISMS SEEN CYTOSPIN SMEAR    Culture   Final    NO GROWTH 3 DAYS Performed at [facility], [address] [place], [place] [address]    Report Status 04/16/2023 FINAL  Final  MRSA Next Gen by PCR, Nasal     Status: None   Collection Time: 04/13/23 11:00 PM   Specimen: Nasal Mucosa; Nasal Swab  Result Value Ref Range Status   MRSA by PCR Next Gen NOT DETECTED NOT DETECTED Final    Comment: (NOTE) The GeneXpert MRSA Assay (FDA approved for NASAL specimens only), is one component of a comprehensive MRSA colonization surveillance program. It is  not intended to diagnose MRSA infection nor to guide or monitor treatment for MRSA infections. Test performance is not FDA approved in patients less than 73 years old. Performed at [facility], [address] [place], [place] [address]     Medications: Scheduled Meds:  sodium chloride   Intravenous Once   sodium chloride   Intravenous Once   sodium chloride   Intravenous Once   [START ON 04/17/2023] apixaban  2.5 mg Oral BID   aspirin  81 mg Per Tube Daily   [START ON 04/17/2023] atorvastatin  80 mg Oral Daily   carvedilol  3.125 mg Oral BID WC   Chlorhexidine Gluconate Cloth  6 each Topical Daily  clopidogrel  75 mg Per Tube Q breakfast   ezetimibe  10 mg Per Tube Daily   fentaNYL  1 patch Transdermal Q72H   folic acid  1 mg Oral Daily   hydrALAZINE  25 mg Oral BID   isosorbide mononitrate  30 mg Oral Daily   multivitamin with minerals  1 tablet Oral Daily   ondansetron (ZOFRAN) IV  4 mg Intravenous Once   mouth rinse  15 mL Mouth Rinse 4 times per day   pantoprazole (PROTONIX) IV  40 mg Intravenous Daily   sodium chloride flush  10-40 mL Intracatheter Q12H   thiamine  100 mg Per Tube Daily   Or   thiamine  100 mg Intravenous Daily   Continuous Infusions: PRN Meds:.acetaminophen, fentaNYL (SUBLIMAZE) injection, HYDROcodone-acetaminophen, nitroGLYCERIN, ondansetron (ZOFRAN) IV, mouth rinse, polyvinyl alcohol, sodium chloride flush  No Known Allergies  Urinalysis: No results for input(s): "COLORURINE", "LABSPEC", "PHURINE", "GLUCOSEU", "HGBUR", "BILIRUBINUR", "KETONESUR", "PROTEINUR", "UROBILINOGEN", "NITRITE", "LEUKOCYTESUR" in the last 72 hours.  Invalid input(s): "APPERANCEUR"    Imaging: US Venous Img Upper Uni Right(DVT)  Result Date: 04/16/2023 CLINICAL DATA:  Right upper extremity swelling for 3 days EXAM: RIGHT UPPER EXTREMITY VENOUS DOPPLER ULTRASOUND TECHNIQUE: Gray-scale sonography with graded compression, as well as color Doppler and duplex  ultrasound were performed to evaluate the upper extremity deep venous system from the level of the subclavian vein and including the jugular, axillary, basilic, radial, ulnar and upper cephalic vein. Spectral Doppler was utilized to evaluate flow at rest and with distal augmentation maneuvers. COMPARISON:  None Available. FINDINGS: Contralateral Subclavian Vein: Respiratory phasicity is normal and symmetric with the symptomatic side. No evidence of thrombus. Normal compressibility. Internal Jugular Vein: No evidence of thrombus. Normal compressibility, respiratory phasicity and response to augmentation. Subclavian Vein: No evidence of thrombus. Normal compressibility, respiratory phasicity and response to augmentation. Axillary Vein: No evidence of thrombus. Normal compressibility, respiratory phasicity and response to augmentation. Cephalic Vein: Occlusive superficial thrombophlebitis of the mid to distal right cephalic vein. Proximal right cephalic vein is patent and compressible and without thrombus. Basilic Vein: No evidence of thrombus. Normal compressibility, respiratory phasicity and response to augmentation. Brachial Veins: No evidence of thrombus. Normal compressibility, respiratory phasicity and response to augmentation. Radial Veins: No evidence of thrombus. Normal compressibility, respiratory phasicity and response to augmentation. Ulnar Veins: No evidence of thrombus. Normal compressibility, respiratory phasicity and response to augmentation. Venous Reflux:  None visualized. Other Findings: Right internal jugular central venous catheter seen within the right internal jugular vein without surrounding thrombus. IMPRESSION: 1. Occlusive superficial thrombophlebitis of the mid to distal right cephalic vein. 2. No evidence of deep venous thrombosis in the right upper extremity. Electronically Signed   By: Delbert Phenix M.D.   On: 04/16/2023 13:47   DG Chest Port 1 View  Result Date: 04/16/2023 CLINICAL  DATA:  77 year old female with history of pleural effusions. EXAM: PORTABLE CHEST 1 VIEW COMPARISON:  Chest x-ray 04/15/2023. FINDINGS: Patient has been extubated. Nasogastric tube seen on the prior study has been removed. Right internal jugular central venous catheter with tip terminating at the superior cavoatrial junction. Opacity at the left base which may reflect atelectasis and/or consolidation. Small left pleural effusion. Right lung appears clear. No definite right pleural effusion. No pneumothorax. No evidence of pulmonary edema. Heart size is normal. Upper mediastinal contours are within normal limits. IMPRESSION: 1. Support apparatus, as above. 2. Atelectasis and/or consolidation in the left lung base with small left pleural effusion. Electronically Signed  By: Trudie Reed M.D.   On: 04/16/2023 06:56   US RENAL  Result Date: 04/15/2023 CLINICAL DATA:  Acute renal failure. EXAM: RENAL / URINARY TRACT ULTRASOUND COMPLETE COMPARISON:  CT abdomen pelvis dated April 13, 2023. FINDINGS: Right Kidney: Renal measurements: 7.5 x 4.3 x 4.1 cm = volume: 69 mL. Echogenicity within normal limits. No mass or hydronephrosis visualized. Left Kidney: Renal measurements: 7.7 x 4.2 x 3.6 cm = volume: 61 mL. Echogenicity within normal limits. No mass or hydronephrosis visualized. Bladder: Decompressed by Foley catheter. Other: Bilateral pleural effusions. Small amount of fluid in the upper abdomen. IMPRESSION: 1. No acute abnormality.  No hydronephrosis. Electronically Signed   By: Obie Dredge M.D.   On: 04/15/2023 18:02   DG Abd 1 View  Result Date: 04/15/2023 CLINICAL DATA:  Right retroperitoneal hematoma. EXAM: ABDOMEN - 1 VIEW COMPARISON:  04/14/2023 FINDINGS: No gaseous bowel dilatation. Foley catheter noted in the urinary bladder. Persistent contrast retention in the kidneys. Skin staples overlie the right groin region. IMPRESSION: 1. Nonobstructive bowel gas pattern. 2. Persistent contrast  retention in the kidneys. Electronically Signed   By: Kennith Center M.D.   On: 04/15/2023 14:11   DG Chest Port 1 View  Result Date: 04/15/2023 CLINICAL DATA:  Endotracheal tube positioning/placement. EXAM: PORTABLE CHEST 1 VIEW COMPARISON:  Chest radiograph dated 04/14/2023. FINDINGS: The heart size and mediastinal contours are within normal limits. There is mild bibasilar atelectasis/airspace disease. No pneumothorax. An endotracheal tube terminates in the midthoracic trachea. An enteric tube enters the stomach and terminates below the field of view. IMPRESSION: Mild bibasilar atelectasis/airspace disease. Electronically Signed   By: Romona Curls M.D.   On: 04/15/2023 13:56      Assessment/Plan:  Mariaisabella Meenan is a 77 y.o. female with medical problems of  hypertension, heart failure with preserved ejection fraction, history of pleural effusions, history of diffuse vascular disease including coronary artery disease-history of PCI in LAD stent October 2023, chronic kidney disease, prediabetes, atrial fibrillation status post cardioversion in 2022  was admitted on 04/12/2023 for :  Status post thoracentesis [Z98.890] NSTEMI (non-ST elevated myocardial infarction) (HCC) [I21.4] Hypertensive emergency [I16.1] Chest pain [R07.9]  1.  Acute kidney injury on chronic kidney disease stage IIIb Baseline creatinine of 1.2/GFR 44 from 04/13/2023 Creatinine has steadily increased to 2.63 today. Acute kidney injury most likely secondary to ATN due to cardiogenic shock, hypotension from complications of cardiac catheterization and IV contrast exposure on April 13, 2023. Renal ultrasound-04/15/2023-no acute abnormality. At present she is hemodynamically stable. Volume status, respiratory status and electrolytes are acceptable.  No acute indication for dialysis at present. We will continue to monitor and follow along during hospitalization. -Avoid nephrotoxins including IV contrast,  nonsteroidals. -Maintain hemodynamic support and avoid hypotension. -Obtain urinalysis   Dawn Bruce Thedore Mins 04/16/23

## 2023-04-16 NOTE — Progress Notes (Signed)
OT Cancellation Note  Patient Details Name: Dawn Bruce MRN: [id number] DOB: 02-04-1946   Cancelled Treatment:    Reason Eval/Treat Not Completed: Medical issues which prohibited therapy. MD reports plan to remove femoral A line this morning as pt is limited in regards to mobility until removed. A Line is still in place at this time. OT will re-attempt evaluation once removed as time allows.   Jackquline Denmark, MS, OTR/L , CBIS ascom [phone]  04/16/23, 1:37 PM

## 2023-04-16 NOTE — Progress Notes (Signed)
NAME:  Dawn Bruce, MRN:  [id number], DOB:  07-22-1945, LOS: 4 ADMISSION DATE:  04/12/2023, CONSULTATION DATE:  04/13/23 REFERRING MD:  Dr. Darrold Junker, CHIEF COMPLAINT:  Chest Pain   Brief Pt Description / Synopsis:  77 y.o. female admitted with NSTEMI requiring cardiac catheterization with PCI and stent to LAD.  Course complicated by Acute Blood Loss Anemia and Hemorrhagic shock due to Right Retroperitoneal Hematoma in the setting of active extravasation from the right external iliac, status post repair of the right external iliac and right lateral circumflex artery by Vascular Surgery.  History of Present Illness:  77 y.o with significant PMH of Chronic Systolic Heart Failure with recovered EF (previously EF 35-40% improved to > 55%), PAF on Eliquis s/p catheter ablation (03/2021), HTN, HLD, previous tobacco use, recurrent pleural effusions (L>R) previously requiring thoracentesis, skin cancer, CAD s/p R CEA, CKD stage 3 who presented to the ED on 04/12/2023 for chest pain and elevated blood pressure   ED Course: Initial vital signs showed HR of 82 beats/minute, BP 173/86 mm Hg, the RR 18 breaths/minute, and the oxygen saturation 95% on RA and a temperature of 98.88F (36.7C).  Pertinent Labs/Diagnostics Findings: Na+/ K+: 141/3.4 Glucose: 187 BUN/Cr.:24/1.29,  WBC: Unremarkable,  BNP: 270 ,Troponin 27 @15 :30 --> 609 @17 :30, (+)D-Dimer 0.79,  EKG nonacute, CXR increase L pleural effusion, CTA no PE but (+)pleural effusion L, bronchitis/RAD but no discrete pneumonia.    Disposition: Admitted to hospitalist service and started heparin infusion. Cardiology consulted to eval for LHC.   Please see "Significant Hospital Events" section below for full detailed hospital course.   Pertinent  Medical History  Chronic Systolic Heart Failure with recovered EF (previously EF 35-40% improved to > 55%), PAF on Eliquis s/p catheter ablation (03/2021), HTN, HLD, previous tobacco use, recurrent pleural  effusions (L>R) previously requiring thoracentesis, skin cancer, CAD s/p R CEA, CKD stage 3   Micro Data:  11/22: Pleural fluid>> no growth to date 11/22: MRSA PCR>>negative  Antimicrobials:   Anti-infectives (From admission, onward)    None      Procedures:  11/22: LHC with LAD stenting 11/22: Intubation 11/22: Right IJ central line 11/22: Right groin exploration with repair of the right external iliac and right lateral circumflex arteries  11/22: Left radial art line by anesthesiology discontinued 11/22: Left femoral art line   Significant Diagnostic Tests:  11/22: CTA abdomen and pelvis> IMPRESSION: 1. Large right-sided retroperitoneal hematoma arising in the vicinity of the right external iliac artery and extending into the right paracolic gutter, dimensions at least 18.6 x 7.7 x 6.2 cm. 2. Brisk arterial extravasation arising in the vicinity of the right inferior epigastric artery origin and distal portion of the right external iliac artery. 3. Small volume hemoperitoneum about the liver and in the pelvis. 4. Moderate left, small right pleural effusions and associated atelectasis or consolidation.  Significant Hospital Events: Including procedures, antibiotic start and stop dates in addition to other pertinent events   11/21: Admitted to stepdown unit under hospitalist service with NSTEMI .  11/22: S/p Left Thoracentesis . S/p LHC with LAD stenting via the right groin. Postop pt became hypotensive requiring vasopressor with right groin pain. CTA obtained and showed large right retroperitoneal hematoma along with active extravasation from the right external iliac and small vol hemoperitoneum about the liver and in the pelvis. PCCM consulted, pt Intubated for airway protection and pt taken emergently to OR by vascular for repair. 04/15/23- Patient is off vasopressor support. EXTUBATED.  Mild thrombocytopenia.  Repeat CXR and KUB today.   04/16/23- Tolerating  extubation, remains off vasopressors. Hemoglobin remains stable.  Consult Nephrology for worsening AKI and oliguria.  Remove A-line and central line.   Interim History / Subjective:  -No significant events noted overnight ~ tolerating extubation, on 2L [place] -Afebrile, hemodynamically stable, remains off vasopressors -Complains of pain in Right groin ~ Hgb remains stable at 0.1 from 9.1 ~ will repeat H&H again this afternoon -Creatinine worsened to 2.6 from 2, UOP 325 last 24 hrs (net + 7.1 L) ~ will consult Nephrology -Right arm notably more swollen than left ~ obtain US of RUE -Now that pt is no longer hypotensive and noted to have EF 25-30% ~ will wean off Midodrine   Objective   Blood pressure (!) 122/43, pulse (!) 56, temperature (!) 97.4 F (36.3 C), temperature source Axillary, resp. rate 16, height 5\' 3"  (1.6 m), weight 54.4 kg, SpO2 100%. CVP:  [9 mmHg] 9 mmHg  Vent Mode: PSV FiO2 (%):  [21 %-28 %] 28 % Set Rate:  [14 bmp] 14 bmp Vt Set:  [400 mL] 400 mL PEEP:  [5 cmH20] 5 cmH20 Pressure Support:  [5 cmH20] 5 cmH20 Plateau Pressure:  [12 cmH20] 12 cmH20   Intake/Output Summary (Last 24 hours) at 04/16/2023 0744 Last data filed at 04/16/2023 0600 Gross per 24 hour  Intake 1004.57 ml  Output 326 ml  Net 678.57 ml   Filed Weights   04/12/23 1518 04/13/23 1645 04/15/23 0400  Weight: 61.2 kg 58.1 kg 54.4 kg    Examination: General: Acutely ill-appearing female, sitting in bed, on 2 L nasal cannula, complaints of right groin pain but no acute distress HENT: Atraumatic, normocephalic, neck supple, no JVD, moist mucous membranes Lungs: Clear breath sounds throughout, diminished on the left side, even, nonlabored, normal effort Cardiovascular: Regular rate and rhythm, S1-S2, no murmurs, rubs, gallops Abdomen: Slightly tall and distended, tender to palpation to lower quadrants, no guarding or rebound tenderness, bowel sounds positive x 4 Extremities: Normal bulk and tone, no  deformities, edema to bilateral upper extremities (right greater than left) Neuro: Awake and alert, oriented x 3, moves all extremities to command, no focal deficits, speech clear GU: Foley catheter in place draining small amount of yellow urine  Resolved Hospital Problem list   Hemorrhagic shock  Assessment & Plan:   #Shock: Hemorrhagic + Cardiogenic ~ RESOLVED #NSTEMI #Chronic Systolic Heart Failure with recovered EF (previously EF 35-40% improved to > 55%)  #PAF on Eliquis s/p catheter ablation (03/2021  Hx: CAD s/p R CEA, HTN, HLD S/p left heart cath  with LAD stenting via the right groin POD # 1 -Continuous cardiac monitoring -Maintain MAP >65 -Transfusions as indicated -Vasopressors as needed to maintain MAP goal ~ weaned off -Wean off Midodrine given EF of 25-30% -Trend lactic acid until normalized (4.3 ~ 6.1 ~ 5.5 ~ 2.6 ~) -Trend HS Troponin until peaked -Echocardiogram pending -Diuresis as BP and renal function permits ~ currently holding due to AKI -Continue Aspirin and Plavix, Resume Eliquis once cleared by Vascular Surgery (plan for triple therapy x2 weeks, then plan to d/c ASA) -Resume outpatient Coreg as per Cardiology  #Acute Blood Loss Anemia #Right Retroperitoneal Hematoma with active extravasation from the right external iliac with small volume hemoperitoneum S/P emergent repair of the right external iliac and right lateral circumflex artery  -Vascular Surgery following, appreciate input -Monitor for S/Sx of bleeding -Trend CBC -SCD's for VTE Prophylaxis ~ resume Eliquis when cleared by Vascular Surgery -  Transfuse for Hgb <7  #Acute Hypoxic Respiratory Failure #Intubated for Airway Protection pre-op ~ EXTUBATED 11/24 #Recurrent Pleural Effusion L>R s/p left thoracentesis 11/22 -Supplemental O2 as needed to maintain O2 sats >92% -Follow intermittent Chest X-ray & ABG as needed -Bronchodilators prn -Diuresis as BP and renal function permits -Pulmonary toilet  as able  #AKI on CKD Stage III #Anion Gap Metabolic Acidosis due to Lactic Acidosis ~ RESOLVED Renal US negative -Monitor I&O's / urinary output -Follow BMP -Ensure adequate renal perfusion -Avoid nephrotoxic agents as able -Replace electrolytes as indicated ~ Pharmacy following for assistance with electrolyte replacement -Consult Nephrology, appreciate input  #Transaminitis, likely shock liver ~ IMPROVING -Trend LFT's and coags      Best Practice (right click and "Reselect all SmartList Selections" daily)   Diet/type: Regular consistency (see orders) DVT prophylaxis: SCD GI prophylaxis: PPI Lines: Central line, Arterial Line, and No longer needed.  Order written to d/c  Foley:  Yes, and it is still needed Code Status:  full code Last date of multidisciplinary goals of care discussion [11/25]  11/25: Pt and her granddaughter updated at bedside on plan of care.  Labs   CBC: Recent Labs  Lab 04/13/23 0415 04/14/23 0044 04/14/23 0542 04/14/23 1421 04/14/23 1621 04/15/23 0420 04/15/23 1409 04/16/23 0151 04/16/23 0356  WBC 9.5   < > 13.3*  --  16.9* 11.9*  --  10.2 10.8*  NEUTROABS 8.1*  --   --   --  14.3* 10.0*  --  8.4* 9.0*  HGB 14.3   < > 13.3   < > 10.4* 9.5* 7.9* 9.2* 9.1*  HCT 43.6   < > 39.4   < > 29.1* 27.1* 22.5* 26.8* 26.7*  MCV 86.9   < > 87.8  --  84.3 84.7  --  83.5 83.4  PLT 192   < > 105*  --  92* 81*  --  65* 65*   < > = values in this interval not displayed.    Basic Metabolic Panel: Recent Labs  Lab 04/12/23 1522 04/13/23 0415 04/14/23 0542 04/15/23 0420 04/15/23 1223 04/16/23 0356  NA 141 142 140 140  --  137  K 3.4* 3.9 4.7 3.2* 3.5 3.7  CL 108 107 109 104  --  102  CO2 23 21* 18* 26  --  25  GLUCOSE 187* 124* 182* 112*  --  101*  BUN 24* 24* 28* 39*  --  43*  CREATININE 1.29* 1.24* 1.63* 2.06*  --  2.63*  CALCIUM 8.6* 9.3 6.8* 6.9*  --  7.4*  MG  --  2.0  --  1.4* 2.0 2.1  PHOS  --  3.9  --  4.6  --  4.4   GFR: Estimated  Creatinine Clearance: 14.3 mL/min (A) (by C-G formula based on SCr of 2.63 mg/dL (H)). Recent Labs  Lab 04/14/23 0124 04/14/23 0542 04/14/23 0544 04/14/23 0839 04/14/23 1146 04/14/23 1621 04/15/23 0420 04/16/23 0151 04/16/23 0356  WBC  --    < >  --   --   --  16.9* 11.9* 10.2 10.8*  LATICACIDVEN 4.3*  --  6.1* 5.5* 2.6*  --   --   --   --    < > = values in this interval not displayed.    Liver Function Tests: Recent Labs  Lab 04/12/23 1739 04/13/23 0415 04/14/23 0624 04/15/23 0420 04/16/23 0356  AST 34 38 131* 909* 530*  ALT 21 21 59* 477* 325*  ALKPHOS 76  86 45 41 47  BILITOT 0.9 1.0 1.8* 1.0 1.5*  PROT 7.1 6.9 4.0* 3.9* 4.8*  ALBUMIN 3.8 3.6 2.5* 2.4* 2.8*   No results for input(s): "LIPASE", "AMYLASE" in the last 168 hours. No results for input(s): "AMMONIA" in the last 168 hours.  ABG    Component Value Date/Time   PHART 7.41 04/14/2023 1407   PCO2ART 38 04/14/2023 1407   PO2ART 129 (H) 04/14/2023 1407   HCO3 24.1 04/14/2023 1407   ACIDBASEDEF 0.3 04/14/2023 1407   O2SAT 100 04/14/2023 1407     Coagulation Profile: Recent Labs  Lab 04/13/23 0415 04/16/23 0151  INR 1.1 1.2    Cardiac Enzymes: No results for input(s): "CKTOTAL", "CKMB", "CKMBINDEX", "TROPONINI" in the last 168 hours.  HbA1C: Hgb A1c MFr Bld  Date/Time Value Ref Range Status  06/01/2022 12:00 AM 5.5 4.8 - 5.6 % Final    Comment:             Prediabetes: 5.7 - 6.4          Diabetes: >6.4          Glycemic control for adults with diabetes: <7.0   12/12/2021 02:02 PM 5.5 4.8 - 5.6 % Final    Comment:             Prediabetes: 5.7 - 6.4          Diabetes: >6.4          Glycemic control for adults with diabetes: <7.0     CBG: Recent Labs  Lab 04/13/23 2138  GLUCAP 130*    Review of Systems:   Positives in BOLD: Right groin pain Gen: Denies fever, chills, weight change, fatigue, night sweats HEENT: Denies blurred vision, double vision, hearing loss, tinnitus, sinus  congestion, rhinorrhea, sore throat, neck stiffness, dysphagia PULM: Denies shortness of breath, cough, sputum production, hemoptysis, wheezing CV: Denies chest pain, edema, orthopnea, paroxysmal nocturnal dyspnea, palpitations GI: Denies abdominal pain, nausea, vomiting, diarrhea, hematochezia, melena, constipation, change in bowel habits GU: Denies dysuria, hematuria, polyuria, oliguria, urethral discharge Endocrine: Denies hot or cold intolerance, polyuria, polyphagia or appetite change Derm: Denies rash, dry skin, scaling or peeling skin change Heme: Denies easy bruising, bleeding, bleeding gums Neuro: Denies headache, numbness, weakness, slurred speech, loss of memory or consciousness   Past Medical History:  She,  has a past medical history of Actinic keratosis, Chronic pain, GERD (gastroesophageal reflux disease) (?), History of basal cell carcinoma (BCC) (05/02/2021), History of SCC (squamous cell carcinoma) of skin (09/10/2019), History of SCC (squamous cell carcinoma) of skin (03/04/2020), History of squamous cell carcinoma in situ (SCCIS) (03/04/2020), History of squamous cell carcinoma in situ (SCCIS) (03/04/2020), History of squamous cell carcinoma in situ (SCCIS) (10/28/2019), History of squamous cell carcinoma in situ (SCCIS) (10/28/2019), Hyperlipidemia, Hypertension, NSTEMI (non-ST elevated myocardial infarction) (HCC) (04/12/2023), Renal disorder, Squamous cell carcinoma in situ (10/31/2021), and Squamous cell carcinoma of skin (08/28/2022).   Surgical History:   Past Surgical History:  Procedure Laterality Date   ABDOMINAL HYSTERECTOMY     APPENDECTOMY  April 2023   Burst appendix   AUGMENTATION MAMMAPLASTY Bilateral    25-30 years ago   CARDIOVERSION N/A 12/01/2020   Procedure: CARDIOVERSION;  Surgeon: Debbe Odea, MD;  Location: [facility] ORS;  Service: Cardiovascular;  Laterality: N/A;   CAROTID ARTERY ANGIOPLASTY Right 1995(approximate)   CAROTID ENDARTERECTOMY   2001   CORONARY STENT INTERVENTION N/A 04/13/2023   Procedure: CORONARY STENT INTERVENTION;  Surgeon: Marcina Millard, MD;  Location: [facility]  INVASIVE CV LAB;  Service: Cardiovascular;  Laterality: N/A;   COSMETIC SURGERY     ELBOW SURGERY Left    FRACTURE SURGERY  2016   Arm   LEFT HEART CATH AND CORONARY ANGIOGRAPHY N/A 04/13/2023   Procedure: LEFT HEART CATH AND CORONARY ANGIOGRAPHY;  Surgeon: Marcina Millard, MD;  Location: [facility] INVASIVE CV LAB;  Service: Cardiovascular;  Laterality: N/A;   THORACENTESIS N/A 12/27/2020   Procedure: Alanson Puls;  Surgeon: Josephine Igo, DO;  Location: [facility] ENDOSCOPY;  Service: Pulmonary;  Laterality: N/A;   TONSILLECTOMY     TUBAL LIGATION  ?     Social History:   reports that she quit smoking about 34 years ago. Her smoking use included cigarettes. She started smoking about 49 years ago. She has a 3.8 pack-year smoking history. She has been exposed to tobacco smoke. She has never used smokeless tobacco. She reports current alcohol use of about 14.0 standard drinks of alcohol per week. She reports that she does not use drugs.   Family History:  Her family history includes Cancer in her father, mother, and sister. There is no history of Breast cancer.   Allergies No Known Allergies   Home Medications  Prior to Admission medications   Medication Sig Start Date End Date Taking? Authorizing Provider  apixaban (ELIQUIS) 2.5 MG TABS tablet Take 2.5 mg by mouth 2 (two) times daily.   Yes [provider]  atorvastatin (LIPITOR) 80 MG tablet Take 80 mg by mouth daily.   Yes [provider]  carvedilol (COREG) 6.25 MG tablet Take 1 tablet (6.25 mg total) by mouth 2 (two) times daily. Patient taking differently: Take 12.5 mg by mouth 2 (two) times daily. 04/27/22  Yes Bacigalupo, Marzella Schlein, MD  clopidogrel (PLAVIX) 75 MG tablet Take 1 tablet (75 mg total) by mouth daily. 04/02/23  Yes Bacigalupo, Marzella Schlein, MD  ezetimibe (ZETIA) 10 MG  tablet TAKE 1 TABLET(10 MG) BY MOUTH DAILY 04/05/23  Yes Bacigalupo, Marzella Schlein, MD  famotidine (PEPCID) 40 MG tablet Take 1 tablet (40 mg total) by mouth daily. Patient taking differently: Take 40 mg by mouth daily as needed for indigestion or heartburn. 06/28/20  Yes Bacigalupo, Marzella Schlein, MD  fentaNYL (DURAGESIC) 25 MCG/HR Place 2 patches onto the skin every 3 (three) days. 04/04/23  Yes Ostwalt, Edmon Crape, PA-C  pregabalin (LYRICA) 150 MG capsule Take 1 capsule (150 mg total) by mouth 2 (two) times daily. 01/19/23  Yes Bacigalupo, Marzella Schlein, MD  traZODone (DESYREL) 50 MG tablet Take 0.5-1 tablets (25-50 mg total) by mouth at bedtime as needed for sleep. 09/21/22  Yes Bacigalupo, Marzella Schlein, MD  valsartan (DIOVAN) 160 MG tablet Take 160 mg by mouth in the morning and at bedtime. 08/30/21  Yes [provider]     Critical care time: 40 minutes     Harlon Ditty, AGACNP-BC [facility] Prefer epic messenger for cross cover needs If after hours, please call E-link

## 2023-04-17 DIAGNOSIS — I214 Non-ST elevation (NSTEMI) myocardial infarction: Secondary | ICD-10-CM | POA: Diagnosis not present

## 2023-04-17 LAB — RENAL FUNCTION PANEL
Albumin: 2.5 g/dL — ABNORMAL LOW (ref 3.5–5.0)
Anion gap: 7 (ref 5–15)
BUN: 38 mg/dL — ABNORMAL HIGH (ref 8–23)
CO2: 28 mmol/L (ref 22–32)
Calcium: 7.7 mg/dL — ABNORMAL LOW (ref 8.9–10.3)
Chloride: 104 mmol/L (ref 98–111)
Creatinine, Ser: 2.08 mg/dL — ABNORMAL HIGH (ref 0.44–1.00)
GFR, Estimated: 24 mL/min — ABNORMAL LOW (ref >60)
Glucose, Bld: 99 mg/dL (ref 70–99)
Phosphorus: 3.2 mg/dL (ref 2.5–4.6)
Potassium: 3.3 mmol/L — ABNORMAL LOW (ref 3.5–5.1)
Sodium: 139 mmol/L (ref 135–145)

## 2023-04-17 LAB — URINALYSIS, COMPLETE (UACMP) WITH MICROSCOPIC
Bacteria, UA: NONE SEEN
Bilirubin Urine: NEGATIVE
Glucose, UA: NEGATIVE mg/dL
Ketones, ur: NEGATIVE mg/dL
Nitrite: NEGATIVE
Protein, ur: NEGATIVE mg/dL
RBC / HPF: 0 RBC/hpf (ref 0–5)
Specific Gravity, Urine: 1.012 (ref 1.005–1.030)
pH: 6 (ref 5.0–8.0)

## 2023-04-17 LAB — CBC
HCT: 24.9 % — ABNORMAL LOW (ref 36.0–46.0)
Hemoglobin: 8.5 g/dL — ABNORMAL LOW (ref 12.0–15.0)
MCH: 28.3 pg (ref 26.0–34.0)
MCHC: 34.1 g/dL (ref 30.0–36.0)
MCV: 83 fL (ref 80.0–100.0)
Platelets: 75 10*3/uL — ABNORMAL LOW (ref 150–400)
RBC: 3 MIL/uL — ABNORMAL LOW (ref 3.87–5.11)
RDW: 17.8 % — ABNORMAL HIGH (ref 11.5–15.5)
WBC: 9.8 10*3/uL (ref 4.0–10.5)
nRBC: 0 % (ref 0.0–0.2)

## 2023-04-17 LAB — TYPE AND SCREEN
ABO/RH(D): O POS
Antibody Screen: NEGATIVE

## 2023-04-17 LAB — MAGNESIUM: Magnesium: 2.1 mg/dL (ref 1.7–2.4)

## 2023-04-17 LAB — CYTOLOGY - NON PAP

## 2023-04-17 MED ORDER — POTASSIUM CHLORIDE CRYS ER 20 MEQ PO TBCR
40.0000 meq | EXTENDED_RELEASE_TABLET | Freq: Once | ORAL | Status: AC
  Administered 2023-04-17: 40 meq via ORAL
  Filled 2023-04-17: qty 2

## 2023-04-17 NOTE — Progress Notes (Signed)
PROGRESS NOTE    Dawn Bruce   ZOX:[id number] DOB: 01/23/1946  DOA: 04/12/2023 Date of Service: 04/17/23 which is hospital day 5  PCP: Dawn Downer, MD    HPI: Dawn Bruce is a 77 y.o. female with a past medical history of hypertension and CAD status post 2 stents who presents complaining of chest pain and high blood pressure from home.  Patient states that she was sitting on the couch today when she experienced substernal chest pressure that did not radiate and was not associated with any movement.  Patient states that when she trended her blood pressure it continued to rise. Has taken all Rx as usual. Pain lasted approximately 4 hours before resolving spontaneously as patient's blood pressure decreased.   Hospital course / significant events:  11/21: Troponin 27 @15 :30 --> 609 @17 :30, (+)D-Dimer 0.79, EKG nonacute, CXR increase L pleural effusion, admitted to hospitalist service and started heparin infusion, cardiology to consult. CTA no PE but (+)pleural effusion L, bronchitis/RAD but no discrete pneumonia.   11/22: Troponin to 1954 @04 :15. Left Thoracentesis . LHC with LAD stenting via the right groin. Postop hypotensive - vasopressor with right groin pain. CTA large right retroperitoneal hematoma with active extravasation from the right external iliac and small vol hemoperitoneum about the liver and in the pelvis. PCCM consulted, pt Intubated for airway protection and pt taken emergently to OR by vascular for repair.  11/23: remains intubated on pressors 11/24: off pressors, extubated.  11/25: Tolerating extubation, remains off vasopressors. Hemoglobin remains stable. Consult Nephrology for worsening AKI and oliguria.  Remove A-line and central line. Restarted ELiquis.  11/26: hospitalist assumes care. AKI improving.     Consultants:  Cardiology  Interventional Radiology  Vascular Surgery  Nephrology ICU  Procedures/Surgeries: 11/22: thoracentesis  11/22: L heart  cath  11/22: Intubation 11/22: Right IJ central line 11/22: Right groin exploration with repair of the right external iliac and right lateral circumflex arteries  11/22: Left radial art line by anesthesiology discontinued 11/22: Left femoral art line    ASSESSMENT & PLAN:  Retroperitoneal hematoma Hemorrhagic shock d/t hematoma - resovled  Complication of Iliac artery laceration during left heart cath  underwent surgical repair of external iliac on 11/23.  Hgb remains stable.  Vascular surgery following --> outpatient f/u in 3 weeks to remove staples R groin wound vac in place plan for 7-10 days total   NSTEMI Coronary artery disease s/p DES to LAD 02/2022 Ischemic cardiomyopathy Chronic HFrEF, previously recovered now reduced to 25-30% Echo this admission with EF 25-30%, + WMA.  LHC 11/22 with 99% ostial to prox LAD lesion s/p DES with 3.5 x 12 mm Onyx frontier.  Continue aspirin 81 mg daily, Plavix 75 mg daily, atorvastatin 20 mg daily, Zetia 10 mg daily. Will DC aspirin in 2 weeks as below.  Continue carvedilol 3.125 mg twice daily, hydralazine 25 mg bid, and imdur 30 mg daily for GDMT given renal function elevated. Further adjustments pending BP and renal function.  Cardiology to arrange LifeVest.   Paroxysmal atrial fibrillation Patient with hx of PAF on chronic eliquis 2.5 mg bid.  resumed eliquis 2.5 mg twice daily.  plan for triple therapy x2 weeks then discontinue aspirin.     L pleural effusion, recurrent  S/p thoracentesis 11/22 with 650 mL removed. Monitor    Chronic kidney disease stage IIIa Cr 1.29 on admission, up to 2.63 yesterday now 2.08 on AM labs today.  Nephrology evaluated patient yesterday. Continue to monitor BMP.  Normal weight based on BMI: Body mass index is 23.91 kg/m.  Underweight - under 18.5  normal weight - 18.5 to 24.9 overweight - 25 to 29.9 obese - 30 or more   DVT prophylaxis: on heparin gtt for NSTEMI IV fluids: no  continuous IV fluids  Nutrition: NPO pending cath  Central lines / invasive devices: none  Code Status: FULL CODE ACP documentation reviewed: 04/13/23 and has living will and HCPOA on file in [facility]  TOC needs: DME/HH Barriers to dispo / significant pending items: AKI improvement, cardiology/vascular clearance prior to dc.              Subjective / Brief ROS:  Patient reports feeling okay this morning, no concerns Ambulating some w/ help, working w/ therapy Denies CP/SOB.  Pain controlled.  Denies new weakness.  Tolerating diet.  Reports no concerns w/ urination/defecation.   Family Communication: family at bedside on rounds     Objective Findings:  Vitals:   04/17/23 1540 04/17/23 1550 04/17/23 1600 04/17/23 1633  BP: (!) 126/59  (!) 124/53 (!) 124/53  Pulse: 87 84 83 85  Resp: 16 (!) 22 14 19   Temp:   98.2 F (36.8 C)   TempSrc:   Oral   SpO2: 98% 99% 99% 98%  Weight:      Height:        Intake/Output Summary (Last 24 hours) at 04/17/2023 1646 Last data filed at 04/17/2023 1600 Gross per 24 hour  Intake 790 ml  Output 609 ml  Net 181 ml   Filed Weights   04/12/23 1518 04/13/23 1645 04/15/23 0400  Weight: 61.2 kg 58.1 kg 54.4 kg    Examination:  Physical Exam Constitutional:      General: She is not in acute distress. Cardiovascular:     Rate and Rhythm: Normal rate and regular rhythm.  Pulmonary:     Effort: Pulmonary effort is normal.     Breath sounds: Normal breath sounds.  Musculoskeletal:     Right lower leg: No edema.     Left lower leg: No edema.  Neurological:     General: No focal deficit present.     Mental Status: She is alert.  Psychiatric:        Mood and Affect: Mood normal.        Behavior: Behavior normal.          Scheduled Medications:   sodium chloride   Intravenous Once   sodium chloride   Intravenous Once   sodium chloride   Intravenous Once   apixaban  2.5 mg Oral BID   aspirin  81 mg Per Tube Daily    atorvastatin  80 mg Oral Daily   carvedilol  3.125 mg Oral BID WC   Chlorhexidine Gluconate Cloth  6 each Topical Daily   clopidogrel  75 mg Per Tube Q breakfast   ezetimibe  10 mg Per Tube Daily   fentaNYL  1 patch Transdermal Q72H   folic acid  1 mg Oral Daily   hydrALAZINE  25 mg Oral BID   isosorbide mononitrate  30 mg Oral Daily   multivitamin with minerals  1 tablet Oral Daily   ondansetron (ZOFRAN) IV  4 mg Intravenous Once   mouth rinse  15 mL Mouth Rinse 4 times per day   pantoprazole (PROTONIX) IV  40 mg Intravenous Daily   sodium chloride flush  10-40 mL Intracatheter Q12H   thiamine  100 mg Per Tube Daily   Or  thiamine  100 mg Intravenous Daily    Continuous Infusions:   PRN Medications:  acetaminophen, fentaNYL (SUBLIMAZE) injection, HYDROcodone-acetaminophen, nitroGLYCERIN, ondansetron (ZOFRAN) IV, mouth rinse, polyvinyl alcohol, sodium chloride flush  Antimicrobials from admission:  Anti-infectives (From admission, onward)    None           Data Reviewed:  I have personally reviewed the following...  CBC: Recent Labs  Lab 04/13/23 0415 04/14/23 0044 04/14/23 1621 04/15/23 0420 04/15/23 1409 04/16/23 0151 04/16/23 0356 04/16/23 1446 04/16/23 2113 04/17/23 0443  WBC 9.5   < > 16.9* 11.9*  --  10.2 10.8*  --   --  9.8  NEUTROABS 8.1*  --  14.3* 10.0*  --  8.4* 9.0*  --   --   --   HGB 14.3   < > 10.4* 9.5*   < > 9.2* 9.1* 9.0* 8.7* 8.5*  HCT 43.6   < > 29.1* 27.1*   < > 26.8* 26.7* 26.8* 25.4* 24.9*  MCV 86.9   < > 84.3 84.7  --  83.5 83.4  --   --  83.0  PLT 192   < > 92* 81*  --  65* 65*  --   --  75*   < > = values in this interval not displayed.   Basic Metabolic Panel: Recent Labs  Lab 04/13/23 0415 04/14/23 0542 04/15/23 0420 04/15/23 1223 04/16/23 0356 04/17/23 0443  NA 142 140 140  --  137 139  K 3.9 4.7 3.2* 3.5 3.7 3.3*  CL 107 109 104  --  102 104  CO2 21* 18* 26  --  25 28  GLUCOSE 124* 182* 112*  --  101* 99  BUN  24* 28* 39*  --  43* 38*  CREATININE 1.24* 1.63* 2.06*  --  2.63* 2.08*  CALCIUM 9.3 6.8* 6.9*  --  7.4* 7.7*  MG 2.0  --  1.4* 2.0 2.1 2.1  PHOS 3.9  --  4.6  --  4.4 3.2   GFR: Estimated Creatinine Clearance: 18.1 mL/min (A) (by C-G formula based on SCr of 2.08 mg/dL (H)). Liver Function Tests: Recent Labs  Lab 04/12/23 1739 04/13/23 0415 04/14/23 0624 04/15/23 0420 04/16/23 0356 04/17/23 0443  AST 34 38 131* 909* 530*  --   ALT 21 21 59* 477* 325*  --   ALKPHOS 76 86 45 41 47  --   BILITOT 0.9 1.0 1.8* 1.0 1.5*  --   PROT 7.1 6.9 4.0* 3.9* 4.8*  --   ALBUMIN 3.8 3.6 2.5* 2.4* 2.8* 2.5*   No results for input(s): "LIPASE", "AMYLASE" in the last 168 hours. No results for input(s): "AMMONIA" in the last 168 hours. Coagulation Profile: Recent Labs  Lab 04/13/23 0415 04/16/23 0151  INR 1.1 1.2   Cardiac Enzymes: No results for input(s): "CKTOTAL", "CKMB", "CKMBINDEX", "TROPONINI" in the last 168 hours. BNP (last 3 results) No results for input(s): "PROBNP" in the last 8760 hours. HbA1C: No results for input(s): "HGBA1C" in the last 72 hours. CBG: Recent Labs  Lab 04/13/23 2138  GLUCAP 130*   Lipid Profile: Recent Labs    04/15/23 0420  TRIG 75   Thyroid Function Tests: No results for input(s): "TSH", "T4TOTAL", "FREET4", "T3FREE", "THYROIDAB" in the last 72 hours. Anemia Panel: No results for input(s): "VITAMINB12", "FOLATE", "FERRITIN", "TIBC", "IRON", "RETICCTPCT" in the last 72 hours. Most Recent Urinalysis On File:     Component Value Date/Time   COLORURINE YELLOW (A) 04/17/2023 9604  APPEARANCEUR CLEAR (A) 04/17/2023 0443   LABSPEC 1.012 04/17/2023 0443   PHURINE 6.0 04/17/2023 0443   GLUCOSEU NEGATIVE 04/17/2023 0443   HGBUR SMALL (A) 04/17/2023 0443   BILIRUBINUR NEGATIVE 04/17/2023 0443   KETONESUR NEGATIVE 04/17/2023 0443   PROTEINUR NEGATIVE 04/17/2023 0443   NITRITE NEGATIVE 04/17/2023 0443   LEUKOCYTESUR TRACE (A) 04/17/2023 0443    Sepsis Labs: @LABRCNTIP (procalcitonin:4,lacticidven:4) Microbiology: Recent Results (from the past 240 hour(s))  Body fluid culture w Gram Stain     Status: None   Collection Time: 04/13/23 11:47 AM   Specimen: PATH Cytology Pleural fluid  Result Value Ref Range Status   Specimen Description   Final    PLEURAL Performed at [facility], [address] [place], [place] [address]    Special Requests   Final    NONE Performed at [facility], [address] [place], [place] [address]    Gram Stain   Final    WBC PRESENT, PREDOMINANTLY MONONUCLEAR NO ORGANISMS SEEN CYTOSPIN SMEAR    Culture   Final    NO GROWTH 3 DAYS Performed at [facility], [address] [place], [place] [address]    Report Status 04/16/2023 FINAL  Final  MRSA Next Gen by PCR, Nasal     Status: None   Collection Time: 04/13/23 11:00 PM   Specimen: Nasal Mucosa; Nasal Swab  Result Value Ref Range Status   MRSA by PCR Next Gen NOT DETECTED NOT DETECTED Final    Comment: (NOTE) The GeneXpert MRSA Assay (FDA approved for NASAL specimens only), is one component of a comprehensive MRSA colonization surveillance program. It is not intended to diagnose MRSA infection nor to guide or monitor treatment for MRSA infections. Test performance is not FDA approved in patients less than 50 years old. Performed at [facility], [address], [place], [place] [address]       Radiology Studies last 3 days: US Venous Img Upper Uni Right(DVT)  Result Date: 04/16/2023 CLINICAL DATA:  Right upper extremity swelling for 3 days EXAM: RIGHT UPPER EXTREMITY VENOUS DOPPLER ULTRASOUND TECHNIQUE: Gray-scale sonography with graded compression, as well as color Doppler and duplex ultrasound were performed to evaluate the upper extremity deep venous system from the level of the subclavian vein and including the jugular, axillary, basilic, radial, ulnar and upper cephalic vein. Spectral  Doppler was utilized to evaluate flow at rest and with distal augmentation maneuvers. COMPARISON:  None Available. FINDINGS: Contralateral Subclavian Vein: Respiratory phasicity is normal and symmetric with the symptomatic side. No evidence of thrombus. Normal compressibility. Internal Jugular Vein: No evidence of thrombus. Normal compressibility, respiratory phasicity and response to augmentation. Subclavian Vein: No evidence of thrombus. Normal compressibility, respiratory phasicity and response to augmentation. Axillary Vein: No evidence of thrombus. Normal compressibility, respiratory phasicity and response to augmentation. Cephalic Vein: Occlusive superficial thrombophlebitis of the mid to distal right cephalic vein. Proximal right cephalic vein is patent and compressible and without thrombus. Basilic Vein: No evidence of thrombus. Normal compressibility, respiratory phasicity and response to augmentation. Brachial Veins: No evidence of thrombus. Normal compressibility, respiratory phasicity and response to augmentation. Radial Veins: No evidence of thrombus. Normal compressibility, respiratory phasicity and response to augmentation. Ulnar Veins: No evidence of thrombus. Normal compressibility, respiratory phasicity and response to augmentation. Venous Reflux:  None visualized. Other Findings: Right internal jugular central venous catheter seen within the right internal jugular vein without surrounding thrombus. IMPRESSION: 1. Occlusive superficial thrombophlebitis of the mid to distal right  cephalic vein. 2. No evidence of deep venous thrombosis in the right upper extremity. Electronically Signed   By: Delbert Phenix M.D.   On: 04/16/2023 13:47   DG Chest Port 1 View  Result Date: 04/16/2023 CLINICAL DATA:  77 year old female with history of pleural effusions. EXAM: PORTABLE CHEST 1 VIEW COMPARISON:  Chest x-ray 04/15/2023. FINDINGS: Patient has been extubated. Nasogastric tube seen on the prior study has  been removed. Right internal jugular central venous catheter with tip terminating at the superior cavoatrial junction. Opacity at the left base which may reflect atelectasis and/or consolidation. Small left pleural effusion. Right lung appears clear. No definite right pleural effusion. No pneumothorax. No evidence of pulmonary edema. Heart size is normal. Upper mediastinal contours are within normal limits. IMPRESSION: 1. Support apparatus, as above. 2. Atelectasis and/or consolidation in the left lung base with small left pleural effusion. Electronically Signed   By: Trudie Reed M.D.   On: 04/16/2023 06:56   US RENAL  Result Date: 04/15/2023 CLINICAL DATA:  Acute renal failure. EXAM: RENAL / URINARY TRACT ULTRASOUND COMPLETE COMPARISON:  CT abdomen pelvis dated April 13, 2023. FINDINGS: Right Kidney: Renal measurements: 7.5 x 4.3 x 4.1 cm = volume: 69 mL. Echogenicity within normal limits. No mass or hydronephrosis visualized. Left Kidney: Renal measurements: 7.7 x 4.2 x 3.6 cm = volume: 61 mL. Echogenicity within normal limits. No mass or hydronephrosis visualized. Bladder: Decompressed by Foley catheter. Other: Bilateral pleural effusions. Small amount of fluid in the upper abdomen. IMPRESSION: 1. No acute abnormality.  No hydronephrosis. Electronically Signed   By: Obie Dredge M.D.   On: 04/15/2023 18:02   DG Abd 1 View  Result Date: 04/15/2023 CLINICAL DATA:  Right retroperitoneal hematoma. EXAM: ABDOMEN - 1 VIEW COMPARISON:  04/14/2023 FINDINGS: No gaseous bowel dilatation. Foley catheter noted in the urinary bladder. Persistent contrast retention in the kidneys. Skin staples overlie the right groin region. IMPRESSION: 1. Nonobstructive bowel gas pattern. 2. Persistent contrast retention in the kidneys. Electronically Signed   By: Kennith Center M.D.   On: 04/15/2023 14:11   DG Chest Port 1 View  Result Date: 04/15/2023 CLINICAL DATA:  Endotracheal tube positioning/placement. EXAM:  PORTABLE CHEST 1 VIEW COMPARISON:  Chest radiograph dated 04/14/2023. FINDINGS: The heart size and mediastinal contours are within normal limits. There is mild bibasilar atelectasis/airspace disease. No pneumothorax. An endotracheal tube terminates in the midthoracic trachea. An enteric tube enters the stomach and terminates below the field of view. IMPRESSION: Mild bibasilar atelectasis/airspace disease. Electronically Signed   By: Romona Curls M.D.   On: 04/15/2023 13:56   ECHOCARDIOGRAM COMPLETE BUBBLE STUDY  Result Date: 04/14/2023    ECHOCARDIOGRAM REPORT   Patient Name:   SHERIANNE BIELAWSKI Date of Exam: 04/14/2023 Medical Rec #:  [id number]   Height:       63.0 in Accession #:    [id number]  Weight:       128.0 lb Date of Birth:  [birth year]  BSA:          1.600 m Patient Age:    1 years    BP:           92/63 mmHg Patient Gender: F           HR:           86 bpm. Exam Location:  [facility] Procedure: 2D Echo, Cardiac Doppler, Color Doppler and Saline Contrast Bubble            Study Indications:  CHF (congestive heart failure) (HCC) [409811]  History:         Patient has prior history of Echocardiogram examinations. Risk                  Factors:Hypertension.  Sonographer:     Neysa Bonito Roar Referring Phys:  [id number] Dorian Pod HUDSON Diagnosing Phys: Marcina Millard MD IMPRESSIONS  1. Left ventricular ejection fraction, by estimation, is 25 to 30%. The left ventricle has severely decreased function. The left ventricle demonstrates regional wall motion abnormalities (see scoring diagram/findings for description). Left ventricular diastolic parameters were normal.  2. Right ventricular systolic function is normal. The right ventricular size is normal.  3. The mitral valve is normal in structure. Mild mitral valve regurgitation. No evidence of mitral stenosis.  4. The aortic valve is normal in structure. Aortic valve regurgitation is not visualized. No aortic stenosis is present.  5. The inferior vena cava is normal  in size with greater than 50% respiratory variability, suggesting right atrial pressure of 3 mmHg. FINDINGS  Left Ventricle: Left ventricular ejection fraction, by estimation, is 25 to 30%. The left ventricle has severely decreased function. The left ventricle demonstrates regional wall motion abnormalities. The left ventricular internal cavity size was normal  in size. There is no left ventricular hypertrophy. Left ventricular diastolic parameters were normal.  LV Wall Scoring: The apical lateral segment, apical septal segment, and apex are akinetic. The apical anterior segment and apical inferior segment are hypokinetic. Right Ventricle: The right ventricular size is normal. No increase in right ventricular wall thickness. Right ventricular systolic function is normal. Left Atrium: Left atrial size was normal in size. Right Atrium: Right atrial size was normal in size. Pericardium: Trivial pericardial effusion is present. Mitral Valve: The mitral valve is normal in structure. Mild mitral valve regurgitation. No evidence of mitral valve stenosis. MV peak gradient, 5.5 mmHg. The mean mitral valve gradient is 3.0 mmHg. Tricuspid Valve: The tricuspid valve is normal in structure. Tricuspid valve regurgitation is not demonstrated. No evidence of tricuspid stenosis. Aortic Valve: The aortic valve is normal in structure. Aortic valve regurgitation is not visualized. No aortic stenosis is present. Aortic valve mean gradient measures 3.0 mmHg. Aortic valve peak gradient measures 4.6 mmHg. Aortic valve area, by VTI measures 1.14 cm. Pulmonic Valve: The pulmonic valve was normal in structure. Pulmonic valve regurgitation is not visualized. No evidence of pulmonic stenosis. Aorta: The aortic root is normal in size and structure. Venous: The inferior vena cava is normal in size with greater than 50% respiratory variability, suggesting right atrial pressure of 3 mmHg. IAS/Shunts: No atrial level shunt detected by color flow  Doppler. Agitated saline contrast was given intravenously to evaluate for intracardiac shunting. Additional Comments: There is a small pleural effusion in the left lateral region.  LEFT VENTRICLE PLAX 2D LVIDd:         3.90 cm     Diastology LVIDs:         3.40 cm     LV e' medial:    2.84 cm/s LV PW:         1.00 cm     LV E/e' medial:  28.2 LV IVS:        1.10 cm     LV e' lateral:   4.95 cm/s LVOT diam:     1.40 cm     LV E/e' lateral: 16.2 LV SV:         23 LV SV Index:   14 LVOT  Area:     1.54 cm  LV Volumes (MOD) LV vol d, MOD A2C: 48.3 ml LV vol d, MOD A4C: 39.9 ml LV vol s, MOD A2C: 35.0 ml LV vol s, MOD A4C: 28.3 ml LV SV MOD A2C:     13.3 ml LV SV MOD A4C:     39.9 ml LV SV MOD BP:      12.4 ml RIGHT VENTRICLE RV Basal diam:  2.40 cm RV Mid diam:    2.20 cm RV S prime:     6.97 cm/s TAPSE (M-mode): 0.9 cm LEFT ATRIUM             Index        RIGHT ATRIUM          Index LA diam:        3.10 cm 1.94 cm/m   RA Area:     7.55 cm LA Vol (A2C):   39.3 ml 24.57 ml/m  RA Volume:   12.20 ml 7.63 ml/m LA Vol (A4C):   31.6 ml 19.76 ml/m LA Biplane Vol: 35.1 ml 21.94 ml/m  AORTIC VALVE                    PULMONIC VALVE AV Area (Vmax):    1.34 cm     PV Vmax:        0.85 m/s AV Area (Vmean):   1.26 cm     PV Peak grad:   2.9 mmHg AV Area (VTI):     1.14 cm     RVOT Peak grad: 2 mmHg AV Vmax:           107.00 cm/s AV Vmean:          74.200 cm/s AV VTI:            0.203 m AV Peak Grad:      4.6 mmHg AV Mean Grad:      3.0 mmHg LVOT Vmax:         93.40 cm/s LVOT Vmean:        60.600 cm/s LVOT VTI:          0.150 m LVOT/AV VTI ratio: 0.74  AORTA Ao Root diam: 1.90 cm Ao Asc diam:  2.30 cm MITRAL VALVE                TRICUSPID VALVE MV Area (PHT): 5.84 cm     TR Peak grad:   23.6 mmHg MV Area VTI:   0.95 cm     TR Vmax:        243.00 cm/s MV Peak grad:  5.5 mmHg MV Mean grad:  3.0 mmHg     SHUNTS MV Vmax:       1.17 m/s     Systemic VTI:  0.15 m MV Vmean:      80.4 cm/s    Systemic Diam: 1.40 cm MV Decel Time:  130 msec MV E velocity: 80.20 cm/s MV A velocity: 105.00 cm/s MV E/A ratio:  0.76 MV A Prime:    7.1 cm/s Marcina Millard MD Electronically signed by Marcina Millard MD Signature Date/Time: 04/14/2023/2:42:41 PM    Final    DG Abd 1 View  Result Date: 04/14/2023 CLINICAL DATA:  Orogastric tube placement EXAM: ABDOMEN - 1 VIEW COMPARISON:  None Available. FINDINGS: Orogastric tube tip seen within the proximal body of the stomach with the proximal side hole in the region of the gastroesophageal junction. Nonobstructive bowel gas pattern. No gross free  intraperitoneal gas. No organomegaly. Retained contrast opacifies the renal cortices bilaterally related to CT arteriogram performed 04/13/2023. Degenerative changes noted within the lumbar spine. IMPRESSION: 1. Orogastric tube tip within the proximal body of the stomach. Advancement of the catheter by 5-10 cm may more optimally position the device. Electronically Signed   By: Helyn Numbers M.D.   On: 04/14/2023 02:28   DG Chest Port 1 View  Result Date: 04/14/2023 CLINICAL DATA:  Chest tube placement EXAM: PORTABLE CHEST 1 VIEW COMPARISON:  04/13/2023 FINDINGS: Endotracheal tube is seen 2.2 cm above the carina. Nasogastric tube extends into the upper abdomen beyond margin of the examination. Right internal jugular central venous catheter is in place with its tip at the superior cavoatrial junction. Lung volumes are small with mild elevation of the right hemidiaphragm. Retrocardiac opacity persists in keeping with atelectasis or infiltrate within this region. Lungs are otherwise clear. No pneumothorax or pleural effusion. Cardiac size within normal limits. Pulmonary vascularity is normal. No acute bone abnormality. No chest tube identified within the visualized thorax. IMPRESSION: 1. Support tubes in appropriate position. 2. No chest tube identified within the visualized thorax. No pneumothorax. 3. Persistent retrocardiac atelectasis or infiltrate.  Electronically Signed   By: Helyn Numbers M.D.   On: 04/14/2023 02:26   CARDIAC CATHETERIZATION  Result Date: 04/13/2023   Mid LAD lesion is 30% stenosed.   Ost LAD to Prox LAD lesion is 99% stenosed.   A drug-eluting stent was successfully placed using a STENT ONYX FRONTIER 3.5X12.   Post intervention, there is a 0% residual stenosis.   There is mild left ventricular systolic dysfunction.   The left ventricular ejection fraction is 45-50% by visual estimate. 1.  High-grade in-stent restenosis ostial LAD 2.  Successful PCI with 3.5 x 12 mm Onyx frontier DES 3.  Right groin hematoma Recommendations 1.  Dual antiplatelet therapy uninterrupted 1 year 2.  Abdominal CT to rule out retroperitoneal bleed 3.  Further recommendations pending abdominal CT results   CT Angio Abd/Pel w/ and/or w/o  Result Date: 04/13/2023 CLINICAL DATA:  Suspected retroperitoneal hemorrhage from cath lab EXAM: CTA ABDOMEN AND PELVIS WITHOUT AND WITH CONTRAST TECHNIQUE: Multidetector CT imaging of the abdomen and pelvis was performed using the standard protocol during bolus administration of intravenous contrast. Multiplanar reconstructed images and MIPs were obtained and reviewed to evaluate the vascular anatomy. RADIATION DOSE REDUCTION: This exam was performed according to the departmental dose-optimization program which includes automated exposure control, adjustment of the mA and/or kV according to patient size and/or use of iterative reconstruction technique. CONTRAST:  80mL OMNIPAQUE IOHEXOL 350 MG/ML SOLN COMPARISON:  CT abdomen pelvis, 12/13/2021 FINDINGS: VASCULAR Large right-sided retroperitoneal hematoma arising in the vicinity of the right external iliac artery and extending into the right paracolic gutter, dimensions at least 18.6 x 7.7 x 6.2 cm (series 15, image 44, series 10, image 139). Brisk arterial extravasation arising in the vicinity of the right inferior epigastric artery origin and distal portion of the right  external iliac artery (series 10, image 161, series 15, image 44). Normal contour and caliber of the abdominal aorta. No evidence of aortic aneurysm, dissection, or other acute aortic pathology. 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 Moderate mixed calcific atherosclerosis. Review of the MIP images confirms the above findings. NON-VASCULAR Lower Chest: Moderate left, small right pleural effusions and associated atelectasis or consolidation. Bilateral breast implants small hiatal hernia. Hepatobiliary: No solid liver  abnormality is seen. No gallstones, gallbladder wall thickening, or biliary dilatation. Pancreas: Unremarkable. No pancreatic ductal dilatation or surrounding inflammatory changes. Spleen: Normal in size without significant abnormality. Adrenals/Urinary Tract: Adrenal glands are unremarkable. Extensive, lobulated bilateral renal cortical scarring. No calculi or hydronephrosis. Excreted contrast within the collecting systems and bladder following catheterization. Foley catheter in the bladder. Stomach/Bowel: Stomach is within normal limits. Appendix appears normal. No evidence of bowel wall thickening, distention, or inflammatory changes. Sigmoid diverticula. Lymphatic: No enlarged abdominal or pelvic lymph nodes. Reproductive: No mass or other significant abnormality. Other: No abdominal wall hernia or abnormality. Small volume hemoperitoneum about the liver and in the pelvis (series 10, image 156, series 5, image 17). Musculoskeletal: No acute osseous findings. IMPRESSION: 1. Large right-sided retroperitoneal hematoma arising in the vicinity of the right external iliac artery and extending into the right paracolic gutter, dimensions at least 18.6 x 7.7 x 6.2 cm. 2. Brisk arterial extravasation arising in the vicinity of the right inferior epigastric artery origin and distal portion of the right external iliac  artery. 3. Small volume hemoperitoneum about the liver and in the pelvis. 4. Moderate left, small right pleural effusions and associated atelectasis or consolidation. These results were called by telephone at the time of interpretation on 04/13/2023 at 9:25 pm to [facility] PAR[facility] , who verbally acknowledged these results. Aortic Atherosclerosis (ICD10-I70.0). Electronically Signed   By: Jearld Lesch M.D.   On: 04/13/2023 21:38       Time spent: 50 min    Sunnie Nielsen, DO [facility] 04/17/2023, 4:46 PM    Dictation software may have been used to generate the above note. Typos may occur and escape review in typed/dictated notes. Please contact Dr Lyn Hollingshead directly for clarity if needed.  Staff may message me via secure chat in Epic  but this may not receive an immediate response,  please page me for urgent matters!  If 7PM-7AM, please contact night coverage [email or web address]

## 2023-04-17 NOTE — Evaluation (Signed)
Physical Therapy Evaluation Patient Details Name: Dawn Bruce MRN: [id number] DOB: 1945/06/01 Today's Date: 04/17/2023  History of Present Illness  Patient is a 77 year old female presenting with chest pain, NSTEMI. S/p LHC with LAD stenting, post-op required vasopressor support. Right groin exploration with repair of right external iliac artery and right circumflex artery  Clinical Impression  Patient is agreeable to PT evaluation. Granddaughter at the bedside. Patient is independent and driving prior to admission, lives with spouse.  Patient required assistance for bed mobility. She complains of mild burning in the right upper thigh. Several standing bouts performed with cues for technique, Min A initially progressing to CGA. Gait training initiated with rolling walker. Recommend to continue PT to maximize independence and facilitate return to prior level of function. Anticipate patient could return home with family support with continued improvement in mobility and independence.      If plan is discharge home, recommend the following: A little help with walking and/or transfers;A little help with bathing/dressing/bathroom;Help with stairs or ramp for entrance;Assist for transportation   Can travel by private vehicle        Equipment Recommendations None recommended by PT  Recommendations for Other Services       Functional Status Assessment Patient has had a recent decline in their functional status and demonstrates the ability to make significant improvements in function in a reasonable and predictable amount of time.     Precautions / Restrictions Precautions Precautions: Fall Restrictions Weight Bearing Restrictions: No      Mobility  Bed Mobility Overal bed mobility: Needs Assistance Bed Mobility: Supine to Sit     Supine to sit: Mod assist     General bed mobility comments: assistance for LE and trunk support. cues for sequencing    Transfers Overall transfer  level: Needs assistance Equipment used: Rolling walker (2 wheels) Transfers: Sit to/from Stand, Bed to chair/wheelchair/BSC Sit to Stand: Min assist   Step pivot transfers: Contact guard assist       General transfer comment: several standing bouts performed with Min A initially progressing to CGA    Ambulation/Gait Ambulation/Gait assistance: Contact guard assist Gait Distance (Feet): 30 Feet Assistive device: Rolling walker (2 wheels) Gait Pattern/deviations: Step-to pattern, Decreased stance time - right, Antalgic Gait velocity: decreased     General Gait Details: patient ambulated in the room with CGA for safety. education provided on importance of using rolling walker for support and fall prevention at this time. no dizzines reported with ambulation  Stairs            Wheelchair Mobility     Tilt Bed    Modified Rankin (Stroke Patients Only)       Balance Overall balance assessment: Needs assistance Sitting-balance support: Feet supported Sitting balance-Leahy Scale: Good     Standing balance support: Bilateral upper extremity supported, Reliant on assistive device for balance Standing balance-Leahy Scale: Poor Standing balance comment: heavy reliance on rolling walker for support in standing                             Pertinent Vitals/Pain Pain Assessment Pain Assessment: Faces Faces Pain Scale: Hurts a little bit Pain Location: R upper thigh Pain Descriptors / Indicators: Burning Pain Intervention(s): Limited activity within patient's tolerance, Monitored during session, Repositioned    Home Living Family/patient expects to be discharged to:: Private residence Living Arrangements: Spouse/significant other Available Help at Discharge: Family Type of Home: House Home  Access: Stairs to enter       Home Layout: One level Home Equipment: Shower seat;Grab bars - Chartered loss adjuster (2 wheels)      Prior Function Prior Level of  Function : Independent/Modified Independent;Driving             Mobility Comments: independent without device       Extremity/Trunk Assessment   Upper Extremity Assessment Upper Extremity Assessment: Generalized weakness    Lower Extremity Assessment Lower Extremity Assessment: Generalized weakness       Communication   Communication Communication: No apparent difficulties  Cognition Arousal: Alert Behavior During Therapy: WFL for tasks assessed/performed Overall Cognitive Status: Within Functional Limits for tasks assessed                                          General Comments General comments (skin integrity, edema, etc.): vitals stable throughout session    Exercises     Assessment/Plan    PT Assessment Patient needs continued PT services  PT Problem List         PT Treatment Interventions DME instruction;Gait training;Stair training;Functional mobility training;Therapeutic activities;Balance training;Neuromuscular re-education;Therapeutic exercise;Patient/family education    PT Goals (Current goals can be found in the Care Plan section)  Acute Rehab PT Goals Patient Stated Goal: to go home PT Goal Formulation: With patient/family Time For Goal Achievement: 05/01/23 Potential to Achieve Goals: Good    Frequency Min 1X/week     Co-evaluation               AM-PAC PT "6 Clicks" Mobility  Outcome Measure Help needed turning from your back to your side while in a flat bed without using bedrails?: A Little Help needed moving from lying on your back to sitting on the side of a flat bed without using bedrails?: A Lot Help needed moving to and from a bed to a chair (including a wheelchair)?: A Little Help needed standing up from a chair using your arms (e.g., wheelchair or bedside chair)?: A Little Help needed to walk in hospital room?: A Little Help needed climbing 3-5 steps with a railing? : A Little 6 Click Score: 17    End  of Session Equipment Utilized During Treatment: Gait belt Activity Tolerance: Patient tolerated treatment well Patient left: in chair;with call bell/phone within reach;with family/visitor present Nurse Communication: Mobility status PT Visit Diagnosis: Unsteadiness on feet (R26.81);Muscle weakness (generalized) (M62.81)    Time: 4098-1191 PT Time Calculation (min) (ACUTE ONLY): 41 min   Charges:   PT Evaluation $PT Eval Moderate Complexity: 1 Mod PT Treatments $Therapeutic Activity: 8-22 mins PT General Charges $$ ACUTE PT VISIT: 1 Visit         Donna Bernard, PT, MPT  [facility] 04/17/2023, 10:19 AM

## 2023-04-17 NOTE — Evaluation (Signed)
Occupational Therapy Evaluation Patient Details Name: Dawn Bruce MRN: [id number] DOB: [birth year] Today's Date: 04/17/2023   History of Present Illness Patient is a 77 year old female presenting with chest pain, NSTEMI. S/p LHC with LAD stenting, post-op required vasopressor support. Right groin exploration with repair of right external iliac artery and right circumflex artery   Clinical Impression   Chart reviewed, pt greeted in chair, alert and oriented x4, agreeable to OT evaluation. PTA pt is indep in ADL/IADL, amb with no AD. Pt presents with deficits in activity tolerance, strength, balance affecting safe and optimal ADL completion. STS completed with CGA, amb in room approx 5' forward and back 3 attempts with CGA. LB dressing completed with MAX A. Pt will benefit from acute OT to address functional deficits and to facilitate return to optimal ADL performance. OT will follow acutely.       If plan is discharge home, recommend the following: A little help with walking and/or transfers;A little help with bathing/dressing/bathroom;Assistance with cooking/housework;Direct supervision/assist for medications management;Assist for transportation;Help with stairs or ramp for entrance    Functional Status Assessment  Patient has had a recent decline in their functional status and demonstrates the ability to make significant improvements in function in a reasonable and predictable amount of time.  Equipment Recommendations  BSC/3in1    Recommendations for Other Services       Precautions / Restrictions Precautions Precautions: Fall Restrictions Weight Bearing Restrictions: No      Mobility Bed Mobility               General bed mobility comments: NT in recliner pre/post session    Transfers Overall transfer level: Needs assistance Equipment used: Rolling walker (2 wheels) Transfers: Sit to/from Stand Sit to Stand: Contact guard assist                  Balance  Overall balance assessment: Needs assistance Sitting-balance support: Feet supported Sitting balance-Leahy Scale: Good     Standing balance support: Bilateral upper extremity supported, Reliant on assistive device for balance Standing balance-Leahy Scale: Fair                             ADL either performed or assessed with clinical judgement   ADL Overall ADL's : Needs assistance/impaired Eating/Feeding: Set up;Sitting    Grooming: Supervision for washing face, MAX A to brush hair                Lower Body Dressing: Maximal assistance Lower Body Dressing Details (indicate cue type and reason): donn/doff socks, pain limited Toilet Transfer: Contact guard assist;Rolling walker (2 wheels) Toilet Transfer Details (indicate cue type and reason): simulated Toileting- Clothing Manipulation and Hygiene: Minimal assistance;Sit to/from stand Toileting - Clothing Manipulation Details (indicate cue type and reason): anticipate     Functional mobility during ADLs: Contact guard assist;Rolling walker (2 wheels) (approx 5' forward and back 3 attempts; pain limited)       Vision Patient Visual Report: No change from baseline       Perception         Praxis         Pertinent Vitals/Pain Pain Assessment Pain Assessment: 0-10 Faces Pain Scale: Hurts even more Pain Location: RLE Pain Descriptors / Indicators: Burning, Discomfort Pain Intervention(s): Limited activity within patient's tolerance, Monitored during session, Repositioned     Extremity/Trunk Assessment Upper Extremity Assessment Upper Extremity Assessment: Generalized weakness   Lower Extremity Assessment  Lower Extremity Assessment: Generalized weakness   Cervical / Trunk Assessment Cervical / Trunk Assessment: Normal   Communication Communication Communication: No apparent difficulties Cueing Techniques: Verbal cues   Cognition Arousal: Alert Behavior During Therapy: WFL for tasks  assessed/performed Overall Cognitive Status: Within Functional Limits for tasks assessed                                       General Comments  vss throughout    Exercises Other Exercises Other Exercises: edu pt and daughter re: role of OT, role of rehab, DME recommendations, safe ADL completion, importance of progressing mobility as tolerated from staff   Shoulder Instructions      Home Living Family/patient expects to be discharged to:: Private residence Living Arrangements: Spouse/significant other Available Help at Discharge: Family Type of Home: House Home Access: Stairs to enter     Home Layout: One level     Bathroom Shower/Tub: Producer, television/film/video: Standard     Home Equipment: Shower seat;Grab bars - Chartered loss adjuster (2 wheels)   Additional Comments: in the process of getting grab bars for the toilet      Prior Functioning/Environment Prior Level of Function : Independent/Modified Independent;Driving             Mobility Comments: independent without device          OT Problem List: Decreased strength;Impaired balance (sitting and/or standing);Decreased range of motion;Decreased safety awareness;Decreased activity tolerance;Decreased knowledge of use of DME or AE;Cardiopulmonary status limiting activity      OT Treatment/Interventions: Self-care/ADL training;DME and/or AE instruction;Therapeutic activities;Balance training;Therapeutic exercise;Manual therapy    OT Goals(Current goals can be found in the care plan section) Acute Rehab OT Goals Patient Stated Goal: get stronger OT Goal Formulation: With patient Time For Goal Achievement: 05/01/23 Potential to Achieve Goals: Good ADL Goals Pt Will Perform Grooming: with modified independence;sitting;standing Pt Will Perform Lower Body Dressing: with modified independence;sit to/from stand Pt Will Transfer to Toilet: with modified independence;ambulating Pt Will  Perform Toileting - Clothing Manipulation and hygiene: with modified independence;sit to/from stand  OT Frequency: Min 1X/week    Co-evaluation              AM-PAC OT "6 Clicks" Daily Activity     Outcome Measure Help from another person eating meals?: None Help from another person taking care of personal grooming?: A Little Help from another person toileting, which includes using toliet, bedpan, or urinal?: A Little Help from another person bathing (including washing, rinsing, drying)?: A Little Help from another person to put on and taking off regular upper body clothing?: A Little Help from another person to put on and taking off regular lower body clothing?: A Lot 6 Click Score: 18   End of Session Equipment Utilized During Treatment: Rolling walker (2 wheels) Nurse Communication: Mobility status  Activity Tolerance: Patient tolerated treatment well Patient left: in chair;with call bell/phone within reach  OT Visit Diagnosis: Other abnormalities of gait and mobility (R26.89)                Time: 1610-9604 OT Time Calculation (min): 26 min Charges:  OT General Charges $OT Visit: 1 Visit OT Evaluation $OT Eval Moderate Complexity: 1 Mod  Oleta Mouse, OTD OTR/L  04/17/23, 10:48 AM

## 2023-04-17 NOTE — TOC Progression Note (Signed)
Transition of Care [facility]) - Progression Note    Patient Details  Name: Dawn Bruce MRN: [id number] Date of Birth: 12-17-1945  Transition of Care [facility] CM/SW Contact  Chapman Fitch, RN Phone Number: 04/17/2023, 4:13 PM  Clinical Narrative:     Notified by Irving Burton from [employer] that order was received from [facility] PA for life vest.  Clinical secure emailed to [facility]   Expected Discharge Plan: Home w Home Health Services    Expected Discharge Plan and Services     Post Acute Care Choice: Home Health Living arrangements for the past 2 months: Single Family Home                           HH Arranged: PT, OT, RN [facility] Agency: [facility] Date [facility] Agency Contacted: 04/17/23   Representative spoke with at [facility] Agency: Kandee Keen   Social Determinants of Health (SDOH) Interventions SDOH Screenings   Food Insecurity: No Food Insecurity (04/13/2023)  Housing: Low Risk  (04/13/2023)  Transportation Needs: No Transportation Needs (04/13/2023)  Utilities: Not At Risk (04/13/2023)  Alcohol Screen: Low Risk  (12/08/2022)  Depression (PHQ2-9): Low Risk  (12/08/2022)  Financial Resource Strain: Low Risk  (10/13/2022)  Physical Activity: Insufficiently Active (10/13/2022)  Social Connections: Unknown (10/13/2022)  Stress: No Stress Concern Present (10/13/2022)  Tobacco Use: Medium Risk (04/13/2023)    Readmission Risk Interventions     No data to display

## 2023-04-17 NOTE — Progress Notes (Signed)
PHARMACY CONSULT NOTE  Pharmacy Consult for Electrolyte Monitoring and Replacement   Recent Labs: Potassium (mmol/L)  Date Value  04/17/2023 3.3 (L)   Magnesium (mg/dL)  Date Value  16/02/9603 2.1   Calcium (mg/dL)  Date Value  54/01/8118 7.7 (L)   Albumin (g/dL)  Date Value  14/78/2956 2.5 (L)  06/01/2022 3.8   Phosphorus (mg/dL)  Date Value  21/30/8657 3.2   Sodium (mmol/L)  Date Value  04/17/2023 139  06/01/2022 147 (H)   Corrected Ca: 8.4 mg/dL  Assessment:  77 y.o. female  with a past medical history of chronic systolic heart failure with recovered EF (previously EF 35-40% improved to > 55%), persistent atrial fibrillation on Eliquis s/p catheter ablation (03/2021), HTN, HLD, previous tobacco use, recurrent pleural effusions (L>R) previously requiring thoracentesis, skin cancer, carotid disease s/p R CEA, CKD stage 3 who presented to the ED on 04/12/2023 for chest pain. Pharmacy is asked to follow and replace electrolytes while in CCU  Goal of Therapy:  Electrolytes WNL  Plan:  ---40 mEq po KCl ---recheck electrolytes in am  Lowella Bandy ,PharmD Clinical Pharmacist 04/17/2023 7:18 AM

## 2023-04-17 NOTE — TOC Initial Note (Signed)
Transition of Care [facility]) - Initial/Assessment Note    Patient Details  Name: Dawn Bruce MRN: [id number] Date of Birth: 1946/01/15  Transition of Care [facility]) CM/SW Contact:    Chapman Fitch, RN Phone Number: 04/17/2023, 3:12 PM  Clinical Narrative:                  Met with patient and son at bedside  Admitted for: Chest pain Admitted from: Home with spouse PCP: Beryle Flock  Current home health/prior home health/DME: RW, cane   Therapy recommending home health and BSC.  Patient in agreement.  CMS Medicare.gov Compare Post Acute Care list reviewed with patient.  Patient states that she she does not have a preference of agency.  Referral made to [facility] with Frances Furbish for RN, PT, OT.  TOC to make referral for [facility] closer to discharge    Expected Discharge Plan: Home w Home Health Services     Patient Goals and CMS Choice            Expected Discharge Plan and Services     Post Acute Care Choice: Home Health Living arrangements for the past 2 months: Single Family Home                           HH Arranged: PT, OT, RN [facility] Agency: [facility] Date [facility] Agency Contacted: 04/17/23   Representative spoke with at [facility] Agency: Kandee Keen  Prior Living Arrangements/Services Living arrangements for the past 2 months: Single Family Home                Current home services: DME    Activities of Daily Living   ADL Screening (condition at time of admission) Independently performs ADLs?: Yes (appropriate for developmental age) Is the patient deaf or have difficulty hearing?: No Does the patient have difficulty seeing, even when wearing glasses/contacts?: No Does the patient have difficulty concentrating, remembering, or making decisions?: No  Permission Sought/Granted                  Emotional Assessment              Admission diagnosis:  Status post thoracentesis [Z98.890] NSTEMI (non-ST elevated myocardial infarction) [facility]) [I21.4] Hypertensive  emergency [I16.1] Chest pain [R07.9] Patient Active Problem List   Diagnosis Date Noted   Tobacco abuse 04/13/2023   NSTEMI (non-ST elevated myocardial infarction) (HCC) 04/12/2023   Chest pain 04/12/2023   OAB (overactive bladder) 04/27/2022   Chronic diastolic heart failure (HCC) 09/27/2021   Insomnia 09/06/2021   Perforated appendicitis 08/31/2021   Carotid artery occlusion 02/07/2021   Cerebral atherosclerosis 02/07/2021   Hypertensive heart disease without congestive heart failure 02/07/2021   Occlusion and stenosis of bilateral carotid arteries 02/07/2021   Vitamin D deficiency, unspecified 02/07/2021   Chronic anticoagulation 01/27/2021   Senile purpura (HCC) 12/03/2020   Prediabetes 10/08/2019   Stenosis of right carotid artery 10/03/2018   Hyperlipidemia 10/03/2018   Essential hypertension 10/02/2018   CKD (chronic kidney disease) stage 3, GFR 30-59 ml/min (HCC) 06/17/2018   Chronic forearm pain (Primary Area of Pain) (Left) 06/17/2018   Neurogenic pain 06/17/2018   Chronic pain syndrome 06/06/2018   Long term current use of opiate analgesic 06/06/2018   PCP:  Erasmo Downer, MD Pharmacy:   [facility] [phone] - [place], [place] - [address] [place] [address] [place] [address]  Phone: [phone] Fax: [phone]  [id number] - [place], [place] - [address] AT [facility] [address] [place] [address] Phone: [phone] Fax: [phone]     Social Determinants of Health (SDOH) Social History: SDOH Screenings   Food Insecurity: No Food Insecurity (04/13/2023)  Housing: Low Risk  (04/13/2023)  Transportation Needs: No Transportation Needs (04/13/2023)  Utilities: Not At Risk (04/13/2023)  Alcohol Screen: Low Risk  (12/08/2022)  Depression (PHQ2-9): Low Risk  (12/08/2022)  Financial Resource Strain: Low Risk  (10/13/2022)  Physical Activity: Insufficiently  Active (10/13/2022)  Social Connections: Unknown (10/13/2022)  Stress: No Stress Concern Present (10/13/2022)  Tobacco Use: Medium Risk (04/13/2023)   SDOH Interventions:     Readmission Risk Interventions     No data to display

## 2023-04-17 NOTE — Progress Notes (Signed)
[facility] [place], [place] 04/17/23  Subjective:   Hospital day # 5   Renal: 11/25 0701 - 11/26 0700 In: 160 [P.O.:150; I.V.:10] Out: 774 [Urine:774] Lab Results  Component Value Date   CREATININE 2.08 (H) 04/17/2023   CREATININE 2.63 (H) 04/16/2023   CREATININE 2.06 (H) 04/15/2023   Patient is feeling well.  Sitting up in the chair earlier today when seen. Serum creatinine is improving  Objective:  Vital signs in last 24 hours:  Temp:  [98.2 F (36.8 C)] 98.2 F (36.8 C) (11/26 1600) Pulse Rate:  [73-106] 93 (11/26 1800) Resp:  [10-39] 21 (11/26 1800) BP: (97-152)/(49-90) 135/90 (11/26 1800) SpO2:  [93 %-100 %] 97 % (11/26 1800)  Weight change:  Filed Weights   04/12/23 1518 04/13/23 1645 04/15/23 0400  Weight: 61.2 kg 58.1 kg 54.4 kg    Intake/Output:    Intake/Output Summary (Last 24 hours) at 04/17/2023 2004 Last data filed at 04/17/2023 1600 Gross per 24 hour  Intake 790 ml  Output 429 ml  Net 361 ml     Physical Exam: General: No acute distress, sitting up in the chair  HEENT Moist oral mucous membranes  Pulm/lungs Normal breathing effort on room air  CVS/Heart Regular  Abdomen:  Soft, nontender  Extremities: No peripheral edema  Neurologic: Alert, oriented  Skin: Warm, dry  Access:        Basic Metabolic Panel:  Recent Labs  Lab 04/13/23 0415 04/14/23 0542 04/15/23 0420 04/15/23 1223 04/16/23 0356 04/17/23 0443  NA 142 140 140  --  137 139  K 3.9 4.7 3.2* 3.5 3.7 3.3*  CL 107 109 104  --  102 104  CO2 21* 18* 26  --  25 28  GLUCOSE 124* 182* 112*  --  101* 99  BUN 24* 28* 39*  --  43* 38*  CREATININE 1.24* 1.63* 2.06*  --  2.63* 2.08*  CALCIUM 9.3 6.8* 6.9*  --  7.4* 7.7*  MG 2.0  --  1.4* 2.0 2.1 2.1  PHOS 3.9  --  4.6  --  4.4 3.2     CBC: Recent Labs  Lab 04/13/23 0415 04/14/23 0044 04/14/23 1621 04/15/23 0420 04/15/23 1409 04/16/23 0151 04/16/23 0356 04/16/23 1446 04/16/23 2113  04/17/23 0443  WBC 9.5   < > 16.9* 11.9*  --  10.2 10.8*  --   --  9.8  NEUTROABS 8.1*  --  14.3* 10.0*  --  8.4* 9.0*  --   --   --   HGB 14.3   < > 10.4* 9.5*   < > 9.2* 9.1* 9.0* 8.7* 8.5*  HCT 43.6   < > 29.1* 27.1*   < > 26.8* 26.7* 26.8* 25.4* 24.9*  MCV 86.9   < > 84.3 84.7  --  83.5 83.4  --   --  83.0  PLT 192   < > 92* 81*  --  65* 65*  --   --  75*   < > = values in this interval not displayed.     No results found for: "HEPBSAG", "HEPBSAB", "HEPBIGM"    Microbiology:  Recent Results (from the past 240 hour(s))  Body fluid culture w Gram Stain     Status: None   Collection Time: 04/13/23 11:47 AM   Specimen: PATH Cytology Pleural fluid  Result Value Ref Range Status   Specimen Description   Final    PLEURAL Performed at [facility], [address] [place],  [place] [address]    Special Requests   Final    NONE Performed at [facility], [address] [place], [place] [address]    Gram Stain   Final    WBC PRESENT, PREDOMINANTLY MONONUCLEAR NO ORGANISMS SEEN CYTOSPIN SMEAR    Culture   Final    NO GROWTH 3 DAYS Performed at [facility], [address] [place], [place] [address]    Report Status 04/16/2023 FINAL  Final  MRSA Next Gen by PCR, Nasal     Status: None   Collection Time: 04/13/23 11:00 PM   Specimen: Nasal Mucosa; Nasal Swab  Result Value Ref Range Status   MRSA by PCR Next Gen NOT DETECTED NOT DETECTED Final    Comment: (NOTE) The GeneXpert MRSA Assay (FDA approved for NASAL specimens only), is one component of a comprehensive MRSA colonization surveillance program. It is not intended to diagnose MRSA infection nor to guide or monitor treatment for MRSA infections. Test performance is not FDA approved in patients less than 91 years old. Performed at [facility], [address] [place], [place] [address]     Coagulation Studies: Recent Labs    04/16/23 0151  LABPROT 15.6*  INR 1.2     Urinalysis: Recent Labs    04/17/23 0443  COLORURINE YELLOW*  LABSPEC 1.012  PHURINE 6.0  GLUCOSEU NEGATIVE  HGBUR SMALL*  BILIRUBINUR NEGATIVE  KETONESUR NEGATIVE  PROTEINUR NEGATIVE  NITRITE NEGATIVE  LEUKOCYTESUR TRACE*      Imaging: US Venous Img Upper Uni Right(DVT)  Result Date: 04/16/2023 CLINICAL DATA:  Right upper extremity swelling for 3 days EXAM: RIGHT UPPER EXTREMITY VENOUS DOPPLER ULTRASOUND TECHNIQUE: Gray-scale sonography with graded compression, as well as color Doppler and duplex ultrasound were performed to evaluate the upper extremity deep venous system from the level of the subclavian vein and including the jugular, axillary, basilic, radial, ulnar and upper cephalic vein. Spectral Doppler was utilized to evaluate flow at rest and with distal augmentation maneuvers. COMPARISON:  None Available. FINDINGS: Contralateral Subclavian Vein: Respiratory phasicity is normal and symmetric with the symptomatic side. No evidence of thrombus. Normal compressibility. Internal Jugular Vein: No evidence of thrombus. Normal compressibility, respiratory phasicity and response to augmentation. Subclavian Vein: No evidence of thrombus. Normal compressibility, respiratory phasicity and response to augmentation. Axillary Vein: No evidence of thrombus. Normal compressibility, respiratory phasicity and response to augmentation. Cephalic Vein: Occlusive superficial thrombophlebitis of the mid to distal right cephalic vein. Proximal right cephalic vein is patent and compressible and without thrombus. Basilic Vein: No evidence of thrombus. Normal compressibility, respiratory phasicity and response to augmentation. Brachial Veins: No evidence of thrombus. Normal compressibility, respiratory phasicity and response to augmentation. Radial Veins: No evidence of thrombus. Normal compressibility, respiratory phasicity and response to augmentation. Ulnar Veins: No evidence of thrombus. Normal  compressibility, respiratory phasicity and response to augmentation. Venous Reflux:  None visualized. Other Findings: Right internal jugular central venous catheter seen within the right internal jugular vein without surrounding thrombus. IMPRESSION: 1. Occlusive superficial thrombophlebitis of the mid to distal right cephalic vein. 2. No evidence of deep venous thrombosis in the right upper extremity. Electronically Signed   By: Delbert Phenix M.D.   On: 04/16/2023 13:47   DG Chest Port 1 View  Result Date: 04/16/2023 CLINICAL DATA:  77 year old female with history of pleural effusions. EXAM: PORTABLE CHEST 1 VIEW COMPARISON:  Chest x-ray 04/15/2023. FINDINGS: Patient has been extubated. Nasogastric tube seen on the prior study has been  removed. Right internal jugular central venous catheter with tip terminating at the superior cavoatrial junction. Opacity at the left base which may reflect atelectasis and/or consolidation. Small left pleural effusion. Right lung appears clear. No definite right pleural effusion. No pneumothorax. No evidence of pulmonary edema. Heart size is normal. Upper mediastinal contours are within normal limits. IMPRESSION: 1. Support apparatus, as above. 2. Atelectasis and/or consolidation in the left lung base with small left pleural effusion. Electronically Signed   By: Trudie Reed M.D.   On: 04/16/2023 06:56     Medications:     sodium chloride   Intravenous Once   sodium chloride   Intravenous Once   sodium chloride   Intravenous Once   apixaban  2.5 mg Oral BID   aspirin  81 mg Per Tube Daily   atorvastatin  80 mg Oral Daily   carvedilol  3.125 mg Oral BID WC   Chlorhexidine Gluconate Cloth  6 each Topical Daily   clopidogrel  75 mg Per Tube Q breakfast   ezetimibe  10 mg Per Tube Daily   fentaNYL  1 patch Transdermal Q72H   folic acid  1 mg Oral Daily   hydrALAZINE  25 mg Oral BID   isosorbide mononitrate  30 mg Oral Daily   multivitamin with minerals  1  tablet Oral Daily   ondansetron (ZOFRAN) IV  4 mg Intravenous Once   mouth rinse  15 mL Mouth Rinse 4 times per day   pantoprazole (PROTONIX) IV  40 mg Intravenous Daily   sodium chloride flush  10-40 mL Intracatheter Q12H   thiamine  100 mg Per Tube Daily   Or   thiamine  100 mg Intravenous Daily   acetaminophen, fentaNYL (SUBLIMAZE) injection, HYDROcodone-acetaminophen, nitroGLYCERIN, ondansetron (ZOFRAN) IV, mouth rinse, polyvinyl alcohol, sodium chloride flush  Assessment/ Plan:  77 y.o. female with  medical problems of  hypertension, heart failure with preserved ejection fraction, history of pleural effusions, history of diffuse vascular disease including coronary artery disease-history of PCI in LAD stent October 2023, chronic kidney disease, prediabetes, atrial fibrillation status post cardioversion in 2022   admitted on 04/12/2023 for Status post thoracentesis [Z98.890] NSTEMI (non-ST elevated myocardial infarction) (HCC) [I21.4] Hypertensive emergency [I16.1] Chest pain [R07.9]  1.  Acute kidney injury on chronic kidney disease stage IIIb Baseline creatinine of 1.2/GFR 44 from 04/13/2023 Creatinine appears to have peaked and has started to improve. Acute kidney injury most likely secondary to ATN due to cardiogenic shock, hypotension from complications of cardiac catheterization and IV contrast exposure on April 13, 2023. Renal ultrasound-04/15/2023-no acute abnormality. At present she is hemodynamically stable. Volume status, respiratory status and electrolytes are acceptable.  No acute indication for dialysis at present. We will continue to monitor and follow along during hospitalization. -Avoid nephrotoxins including IV contrast, nonsteroidals. -Maintain hemodynamic support and avoid hypotension     LOS: 5 Siren Porrata 11/26/20248:04 PM  [facility] [place], [place] [phone]  Note: This note was prepared with Dragon dictation. Any  transcription errors are unintentional

## 2023-04-17 NOTE — Progress Notes (Addendum)
[facility] CARDIOLOGY PROGRESS NOTE       Patient ID: Dawn Bruce MRN: [id number] DOB/AGE: 79-Feb-1947 79 y.o.  Admit date: 04/12/2023 Referring Physician Dr. Sunnie Nielsen Primary Physician Bacigalupo, Marzella Schlein, MD  Primary Cardiologist Dr. Gilman Buttner ([facility]) Reason for Consultation NSTEMI  HPI: Dawn Bruce is a 77 y.o. female  with a past medical history of chronic systolic heart failure with recovered EF (previously EF 35-40% improved to > 55%), persistent atrial fibrillation on Eliquis s/p catheter ablation (03/2021), HTN, HLD, previous tobacco use, recurrent pleural effusions (L>R) previously requiring thoracentesis, skin cancer, carotid disease s/p R CEA, CKD stage 3 who presented to the ED on 04/12/2023 for chest pain. Cardiology was consulted for further evaluation.   Interval history: -Patient seen and examined this AM. States she is feeling well, resting comfortably in bed.  -Denies any chest pain, SOB, palpitations. HR and BP stable, weaned to room air. -Reports R groin pain is stable. Wound vac in place.   Review of systems complete and found to be negative unless listed above    Past Medical History:  Diagnosis Date   Actinic keratosis    Chronic pain    GERD (gastroesophageal reflux disease) ?   Taking Pepcid   History of basal cell carcinoma (BCC) 05/02/2021   right upper back paraspinal   History of SCC (squamous cell carcinoma) of skin 09/10/2019   left dorsum wrist ED&C done 10/28/19   History of SCC (squamous cell carcinoma) of skin 03/04/2020   right dorsum hand   History of squamous cell carcinoma in situ (SCCIS) 03/04/2020   left dorsum wrist  ED&C   History of squamous cell carcinoma in situ (SCCIS) 03/04/2020   right medial infraorbital  ED&C 04/28/2020   History of squamous cell carcinoma in situ (SCCIS) 10/28/2019   right dorsum hand proximal lateral   History of squamous cell carcinoma in situ (SCCIS) 10/28/2019   right dorsum hand  proximal medial   Hyperlipidemia    Hypertension    NSTEMI (non-ST elevated myocardial infarction) (HCC) 04/12/2023   Renal disorder    Squamous cell carcinoma in situ 10/31/2021   left distal  tricep, EDC   Squamous cell carcinoma of skin 08/28/2022   Right volar forearm - EDC    Past Surgical History:  Procedure Laterality Date   ABDOMINAL HYSTERECTOMY     APPENDECTOMY  April 2023   Burst appendix   AUGMENTATION MAMMAPLASTY Bilateral    25-30 years ago   CARDIOVERSION N/A 12/01/2020   Procedure: CARDIOVERSION;  Surgeon: Debbe Odea, MD;  Location: [facility] ORS;  Service: Cardiovascular;  Laterality: N/A;   CAROTID ARTERY ANGIOPLASTY Right 1995(approximate)   CAROTID ENDARTERECTOMY  2001   CORONARY STENT INTERVENTION N/A 04/13/2023   Procedure: CORONARY STENT INTERVENTION;  Surgeon: Marcina Millard, MD;  Location: [facility] INVASIVE CV LAB;  Service: Cardiovascular;  Laterality: N/A;   COSMETIC SURGERY     ELBOW SURGERY Left    ENDARTERECTOMY FEMORAL Right 04/13/2023   Procedure: Right groin exploration with repair of right external iliac artery and right circumflex artery;  Surgeon: Bertram Denver, MD;  Location: [facility] ORS;  Service: Vascular;  Laterality: Right;   FRACTURE SURGERY  2016   Arm   LEFT HEART CATH AND CORONARY ANGIOGRAPHY N/A 04/13/2023   Procedure: LEFT HEART CATH AND CORONARY ANGIOGRAPHY;  Surgeon: Marcina Millard, MD;  Location: [facility] INVASIVE CV LAB;  Service: Cardiovascular;  Laterality: N/A;   THORACENTESIS N/A 12/27/2020   Procedure: THORACENTESIS;  Surgeon: Tonia Brooms,  Rachel Bo, DO;  Location: [facility];  Service: Pulmonary;  Laterality: N/A;   TONSILLECTOMY     TUBAL LIGATION  ?    Medications Prior to Admission  Medication Sig Dispense Refill Last Dose   apixaban (ELIQUIS) 2.5 MG TABS tablet Take 2.5 mg by mouth 2 (two) times daily.   04/12/2023   atorvastatin (LIPITOR) 80 MG tablet Take 80 mg by mouth daily.   04/12/2023   carvedilol (COREG)  6.25 MG tablet Take 1 tablet (6.25 mg total) by mouth 2 (two) times daily. (Patient taking differently: Take 12.5 mg by mouth 2 (two) times daily.) 180 tablet 3 04/12/2023   clopidogrel (PLAVIX) 75 MG tablet Take 1 tablet (75 mg total) by mouth daily. 90 tablet 1 04/12/2023   ezetimibe (ZETIA) 10 MG tablet TAKE 1 TABLET(10 MG) BY MOUTH DAILY 90 tablet 0 04/12/2023   famotidine (PEPCID) 40 MG tablet Take 1 tablet (40 mg total) by mouth daily. (Patient taking differently: Take 40 mg by mouth daily as needed for indigestion or heartburn.) 90 tablet 0 unk   fentaNYL (DURAGESIC) 25 MCG/HR Place 2 patches onto the skin every 3 (three) days. 20 patch 0 Past Week   pregabalin (LYRICA) 150 MG capsule Take 1 capsule (150 mg total) by mouth 2 (two) times daily. 180 capsule 1 04/12/2023   traZODone (DESYREL) 50 MG tablet Take 0.5-1 tablets (25-50 mg total) by mouth at bedtime as needed for sleep. 30 tablet 3 unk   valsartan (DIOVAN) 160 MG tablet Take 160 mg by mouth in the morning and at bedtime.   04/12/2023   Social History   Socioeconomic History   Marital status: Married    Spouse name: Not on file   Number of children: 3   Years of education: Not on file   Highest education level: Some college, no degree  Occupational History   Occupation: retired  Tobacco Use   Smoking status: Former    Current packs/day: 0.00    Average packs/day: 0.3 packs/day for 15.0 years (3.8 ttl pk-yrs)    Types: Cigarettes    Start date: 80    Quit date: 1990    Years since quitting: 34.9    Passive exposure: Past   Smokeless tobacco: Never   Tobacco comments:    quit around 1990-1995 (?)  Vaping Use   Vaping status: Never Used  Substance and Sexual Activity   Alcohol use: Yes    Alcohol/week: 14.0 standard drinks of alcohol    Types: 14 Standard drinks or equivalent per week    Comment: 2 scotch shots   Drug use: Never   Sexual activity: Not Currently    Birth control/protection: None  Other Topics  Concern   Not on file  Social History Narrative   Not on file   Social Determinants of Health   Financial Resource Strain: Low Risk  (10/13/2022)   Overall Financial Resource Strain (CARDIA)    Difficulty of Paying Living Expenses: Not hard at all  Food Insecurity: No Food Insecurity (04/13/2023)   Hunger Vital Sign    Worried About Running Out of Food in the Last Year: Never true    Ran Out of Food in the Last Year: Never true  Transportation Needs: No Transportation Needs (04/13/2023)   PRAPARE - Administrator, Civil Service (Medical): No    Lack of Transportation (Non-Medical): No  Physical Activity: Insufficiently Active (10/13/2022)   Exercise Vital Sign    Days of Exercise per Week:  5 days    Minutes of Exercise per Session: 20 min  Stress: No Stress Concern Present (10/13/2022)   [employer] of Occupational Health - Occupational Stress Questionnaire    Feeling of Stress : Not at all  Social Connections: Unknown (10/13/2022)   Social Connection and Isolation Panel [NHANES]    Frequency of Communication with Friends and Family: More than three times a week    Frequency of Social Gatherings with Friends and Family: Once a week    Attends Religious Services: Not on Insurance claims handler of Clubs or Organizations: No    Attends Banker Meetings: Never    Marital Status: Married  Catering manager Violence: Not At Risk (04/13/2023)   Humiliation, Afraid, Rape, and Kick questionnaire    Fear of Current or Ex-Partner: No    Emotionally Abused: No    Physically Abused: No    Sexually Abused: No    Family History  Problem Relation Age of Onset   Cancer Mother    Cancer Father    Cancer Sister    Breast cancer Neg Hx      Vitals:   04/17/23 0600 04/17/23 0700 04/17/23 0737 04/17/23 0800  BP: 123/62 122/62  (!) 129/58  Pulse: 80 84 80 78  Resp: 14 15 13 11   Temp:      TempSrc:      SpO2: 94% 93% 94% 94%  Weight:      Height:         PHYSICAL EXAM General: Well appearing female, well nourished, in no acute laying at an incline in hospital bed. HEENT: Normocephalic and atraumatic. Neck: No JVD.  Lungs: Normal respiratory effort on room air. Clear bilaterally to auscultation. No wheezes, crackles, rhonchi.  Heart: HRRR. Normal S1 and S2 without gallops or murmurs.  Abdomen: Non-distended appearing.  Msk: Normal strength and tone for age. Extremities: Warm and well perfused. No clubbing, cyanosis. RUE with edema. No LE edema.  Neuro: Alert and oriented X 3. Psych: Answers questions appropriately.   Labs: Basic Metabolic Panel: Recent Labs    04/16/23 0356 04/17/23 0443  NA 137 139  K 3.7 3.3*  CL 102 104  CO2 25 28  GLUCOSE 101* 99  BUN 43* 38*  CREATININE 2.63* 2.08*  CALCIUM 7.4* 7.7*  MG 2.1 2.1  PHOS 4.4 3.2   Liver Function Tests: Recent Labs    04/15/23 0420 04/16/23 0356 04/17/23 0443  AST 909* 530*  --   ALT 477* 325*  --   ALKPHOS 41 47  --   BILITOT 1.0 1.5*  --   PROT 3.9* 4.8*  --   ALBUMIN 2.4* 2.8* 2.5*   No results for input(s): "LIPASE", "AMYLASE" in the last 72 hours. CBC: Recent Labs    04/16/23 0151 04/16/23 0356 04/16/23 1446 04/16/23 2113 04/17/23 0443  WBC 10.2 10.8*  --   --  9.8  NEUTROABS 8.4* 9.0*  --   --   --   HGB 9.2* 9.1*   < > 8.7* 8.5*  HCT 26.8* 26.7*   < > 25.4* 24.9*  MCV 83.5 83.4  --   --  83.0  PLT 65* 65*  --   --  75*   < > = values in this interval not displayed.   Cardiac Enzymes: No results for input(s): "CKTOTAL", "CKMB", "CKMBINDEX", "TROPONINIHS" in the last 72 hours.  BNP: No results for input(s): "BNP" in the last 72 hours.  D-Dimer: No results  for input(s): "DDIMER" in the last 72 hours.  Hemoglobin A1C: No results for input(s): "HGBA1C" in the last 72 hours. Fasting Lipid Panel: Recent Labs    04/15/23 0420  TRIG 75   Thyroid Function Tests: No results for input(s): "TSH", "T4TOTAL", "T3FREE", "THYROIDAB" in the last  72 hours.  Invalid input(s): "FREET3"  Anemia Panel: No results for input(s): "VITAMINB12", "FOLATE", "FERRITIN", "TIBC", "IRON", "RETICCTPCT" in the last 72 hours.   Radiology: US Venous Img Upper Uni Right(DVT)  Result Date: 04/16/2023 CLINICAL DATA:  Right upper extremity swelling for 3 days EXAM: RIGHT UPPER EXTREMITY VENOUS DOPPLER ULTRASOUND TECHNIQUE: Gray-scale sonography with graded compression, as well as color Doppler and duplex ultrasound were performed to evaluate the upper extremity deep venous system from the level of the subclavian vein and including the jugular, axillary, basilic, radial, ulnar and upper cephalic vein. Spectral Doppler was utilized to evaluate flow at rest and with distal augmentation maneuvers. COMPARISON:  None Available. FINDINGS: Contralateral Subclavian Vein: Respiratory phasicity is normal and symmetric with the symptomatic side. No evidence of thrombus. Normal compressibility. Internal Jugular Vein: No evidence of thrombus. Normal compressibility, respiratory phasicity and response to augmentation. Subclavian Vein: No evidence of thrombus. Normal compressibility, respiratory phasicity and response to augmentation. Axillary Vein: No evidence of thrombus. Normal compressibility, respiratory phasicity and response to augmentation. Cephalic Vein: Occlusive superficial thrombophlebitis of the mid to distal right cephalic vein. Proximal right cephalic vein is patent and compressible and without thrombus. Basilic Vein: No evidence of thrombus. Normal compressibility, respiratory phasicity and response to augmentation. Brachial Veins: No evidence of thrombus. Normal compressibility, respiratory phasicity and response to augmentation. Radial Veins: No evidence of thrombus. Normal compressibility, respiratory phasicity and response to augmentation. Ulnar Veins: No evidence of thrombus. Normal compressibility, respiratory phasicity and response to augmentation. Venous Reflux:   None visualized. Other Findings: Right internal jugular central venous catheter seen within the right internal jugular vein without surrounding thrombus. IMPRESSION: 1. Occlusive superficial thrombophlebitis of the mid to distal right cephalic vein. 2. No evidence of deep venous thrombosis in the right upper extremity. Electronically Signed   By: Delbert Phenix M.D.   On: 04/16/2023 13:47   DG Chest Port 1 View  Result Date: 04/16/2023 CLINICAL DATA:  77 year old female with history of pleural effusions. EXAM: PORTABLE CHEST 1 VIEW COMPARISON:  Chest x-ray 04/15/2023. FINDINGS: Patient has been extubated. Nasogastric tube seen on the prior study has been removed. Right internal jugular central venous catheter with tip terminating at the superior cavoatrial junction. Opacity at the left base which may reflect atelectasis and/or consolidation. Small left pleural effusion. Right lung appears clear. No definite right pleural effusion. No pneumothorax. No evidence of pulmonary edema. Heart size is normal. Upper mediastinal contours are within normal limits. IMPRESSION: 1. Support apparatus, as above. 2. Atelectasis and/or consolidation in the left lung base with small left pleural effusion. Electronically Signed   By: Trudie Reed M.D.   On: 04/16/2023 06:56   US RENAL  Result Date: 04/15/2023 CLINICAL DATA:  Acute renal failure. EXAM: RENAL / URINARY TRACT ULTRASOUND COMPLETE COMPARISON:  CT abdomen pelvis dated April 13, 2023. FINDINGS: Right Kidney: Renal measurements: 7.5 x 4.3 x 4.1 cm = volume: 69 mL. Echogenicity within normal limits. No mass or hydronephrosis visualized. Left Kidney: Renal measurements: 7.7 x 4.2 x 3.6 cm = volume: 61 mL. Echogenicity within normal limits. No mass or hydronephrosis visualized. Bladder: Decompressed by Foley catheter. Other: Bilateral pleural effusions. Small amount of fluid in the  upper abdomen. IMPRESSION: 1. No acute abnormality.  No hydronephrosis.  Electronically Signed   By: Obie Dredge M.D.   On: 04/15/2023 18:02   DG Abd 1 View  Result Date: 04/15/2023 CLINICAL DATA:  Right retroperitoneal hematoma. EXAM: ABDOMEN - 1 VIEW COMPARISON:  04/14/2023 FINDINGS: No gaseous bowel dilatation. Foley catheter noted in the urinary bladder. Persistent contrast retention in the kidneys. Skin staples overlie the right groin region. IMPRESSION: 1. Nonobstructive bowel gas pattern. 2. Persistent contrast retention in the kidneys. Electronically Signed   By: Kennith Center M.D.   On: 04/15/2023 14:11   DG Chest Port 1 View  Result Date: 04/15/2023 CLINICAL DATA:  Endotracheal tube positioning/placement. EXAM: PORTABLE CHEST 1 VIEW COMPARISON:  Chest radiograph dated 04/14/2023. FINDINGS: The heart size and mediastinal contours are within normal limits. There is mild bibasilar atelectasis/airspace disease. No pneumothorax. An endotracheal tube terminates in the midthoracic trachea. An enteric tube enters the stomach and terminates below the field of view. IMPRESSION: Mild bibasilar atelectasis/airspace disease. Electronically Signed   By: Romona Curls M.D.   On: 04/15/2023 13:56   ECHOCARDIOGRAM COMPLETE BUBBLE STUDY  Result Date: 04/14/2023    ECHOCARDIOGRAM REPORT   Patient Name:   BALERIA MANFREDO Date of Exam: 04/14/2023 Medical Rec #:  [id number]   Height:       63.0 in Accession #:    [id number]  Weight:       128.0 lb Date of Birth:  04/12/1946  BSA:          1.600 m Patient Age:    62 years    BP:           92/63 mmHg Patient Gender: F           HR:           86 bpm. Exam Location:  [facility] Procedure: 2D Echo, Cardiac Doppler, Color Doppler and Saline Contrast Bubble            Study Indications:     CHF (congestive heart failure) (HCC) [[id number]]  History:         Patient has prior history of Echocardiogram examinations. Risk                  Factors:Hypertension.  Sonographer:     Neysa Bonito Roar Referring Phys:  [id number] Dorian Pod Audra Kagel Diagnosing Phys:  Marcina Millard MD IMPRESSIONS  1. Left ventricular ejection fraction, by estimation, is 25 to 30%. The left ventricle has severely decreased function. The left ventricle demonstrates regional wall motion abnormalities (see scoring diagram/findings for description). Left ventricular diastolic parameters were normal.  2. Right ventricular systolic function is normal. The right ventricular size is normal.  3. The mitral valve is normal in structure. Mild mitral valve regurgitation. No evidence of mitral stenosis.  4. The aortic valve is normal in structure. Aortic valve regurgitation is not visualized. No aortic stenosis is present.  5. The inferior vena cava is normal in size with greater than 50% respiratory variability, suggesting right atrial pressure of 3 mmHg. FINDINGS  Left Ventricle: Left ventricular ejection fraction, by estimation, is 25 to 30%. The left ventricle has severely decreased function. The left ventricle demonstrates regional wall motion abnormalities. The left ventricular internal cavity size was normal  in size. There is no left ventricular hypertrophy. Left ventricular diastolic parameters were normal.  LV Wall Scoring: The apical lateral segment, apical septal segment, and apex are akinetic. The apical anterior segment and apical inferior segment are hypokinetic. Right  Ventricle: The right ventricular size is normal. No increase in right ventricular wall thickness. Right ventricular systolic function is normal. Left Atrium: Left atrial size was normal in size. Right Atrium: Right atrial size was normal in size. Pericardium: Trivial pericardial effusion is present. Mitral Valve: The mitral valve is normal in structure. Mild mitral valve regurgitation. No evidence of mitral valve stenosis. MV peak gradient, 5.5 mmHg. The mean mitral valve gradient is 3.0 mmHg. Tricuspid Valve: The tricuspid valve is normal in structure. Tricuspid valve regurgitation is not demonstrated. No evidence of  tricuspid stenosis. Aortic Valve: The aortic valve is normal in structure. Aortic valve regurgitation is not visualized. No aortic stenosis is present. Aortic valve mean gradient measures 3.0 mmHg. Aortic valve peak gradient measures 4.6 mmHg. Aortic valve area, by VTI measures 1.14 cm. Pulmonic Valve: The pulmonic valve was normal in structure. Pulmonic valve regurgitation is not visualized. No evidence of pulmonic stenosis. Aorta: The aortic root is normal in size and structure. Venous: The inferior vena cava is normal in size with greater than 50% respiratory variability, suggesting right atrial pressure of 3 mmHg. IAS/Shunts: No atrial level shunt detected by color flow Doppler. Agitated saline contrast was given intravenously to evaluate for intracardiac shunting. Additional Comments: There is a small pleural effusion in the left lateral region.  LEFT VENTRICLE PLAX 2D LVIDd:         3.90 cm     Diastology LVIDs:         3.40 cm     LV e' medial:    2.84 cm/s LV PW:         1.00 cm     LV E/e' medial:  28.2 LV IVS:        1.10 cm     LV e' lateral:   4.95 cm/s LVOT diam:     1.40 cm     LV E/e' lateral: 16.2 LV SV:         23 LV SV Index:   14 LVOT Area:     1.54 cm  LV Volumes (MOD) LV vol d, MOD A2C: 48.3 ml LV vol d, MOD A4C: 39.9 ml LV vol s, MOD A2C: 35.0 ml LV vol s, MOD A4C: 28.3 ml LV SV MOD A2C:     13.3 ml LV SV MOD A4C:     39.9 ml LV SV MOD BP:      12.4 ml RIGHT VENTRICLE RV Basal diam:  2.40 cm RV Mid diam:    2.20 cm RV S prime:     6.97 cm/s TAPSE (M-mode): 0.9 cm LEFT ATRIUM             Index        RIGHT ATRIUM          Index LA diam:        3.10 cm 1.94 cm/m   RA Area:     7.55 cm LA Vol (A2C):   39.3 ml 24.57 ml/m  RA Volume:   12.20 ml 7.63 ml/m LA Vol (A4C):   31.6 ml 19.76 ml/m LA Biplane Vol: 35.1 ml 21.94 ml/m  AORTIC VALVE                    PULMONIC VALVE AV Area (Vmax):    1.34 cm     PV Vmax:        0.85 m/s AV Area (Vmean):   1.26 cm     PV Peak grad:   2.9  mmHg AV Area  (VTI):     1.14 cm     RVOT Peak grad: 2 mmHg AV Vmax:           107.00 cm/s AV Vmean:          74.200 cm/s AV VTI:            0.203 m AV Peak Grad:      4.6 mmHg AV Mean Grad:      3.0 mmHg LVOT Vmax:         93.40 cm/s LVOT Vmean:        60.600 cm/s LVOT VTI:          0.150 m LVOT/AV VTI ratio: 0.74  AORTA Ao Root diam: 1.90 cm Ao Asc diam:  2.30 cm MITRAL VALVE                TRICUSPID VALVE MV Area (PHT): 5.84 cm     TR Peak grad:   23.6 mmHg MV Area VTI:   0.95 cm     TR Vmax:        243.00 cm/s MV Peak grad:  5.5 mmHg MV Mean grad:  3.0 mmHg     SHUNTS MV Vmax:       1.17 m/s     Systemic VTI:  0.15 m MV Vmean:      80.4 cm/s    Systemic Diam: 1.40 cm MV Decel Time: 130 msec MV E velocity: 80.20 cm/s MV A velocity: 105.00 cm/s MV E/A ratio:  0.76 MV A Prime:    7.1 cm/s Marcina Millard MD Electronically signed by Marcina Millard MD Signature Date/Time: 04/14/2023/2:42:41 PM    Final    DG Abd 1 View  Result Date: 04/14/2023 CLINICAL DATA:  Orogastric tube placement EXAM: ABDOMEN - 1 VIEW COMPARISON:  None Available. FINDINGS: Orogastric tube tip seen within the proximal body of the stomach with the proximal side hole in the region of the gastroesophageal junction. Nonobstructive bowel gas pattern. No gross free intraperitoneal gas. No organomegaly. Retained contrast opacifies the renal cortices bilaterally related to CT arteriogram performed 04/13/2023. Degenerative changes noted within the lumbar spine. IMPRESSION: 1. Orogastric tube tip within the proximal body of the stomach. Advancement of the catheter by 5-10 cm may more optimally position the device. Electronically Signed   By: Helyn Numbers M.D.   On: 04/14/2023 02:28   DG Chest Port 1 View  Result Date: 04/14/2023 CLINICAL DATA:  Chest tube placement EXAM: PORTABLE CHEST 1 VIEW COMPARISON:  04/13/2023 FINDINGS: Endotracheal tube is seen 2.2 cm above the carina. Nasogastric tube extends into the upper abdomen beyond margin of the  examination. Right internal jugular central venous catheter is in place with its tip at the superior cavoatrial junction. Lung volumes are small with mild elevation of the right hemidiaphragm. Retrocardiac opacity persists in keeping with atelectasis or infiltrate within this region. Lungs are otherwise clear. No pneumothorax or pleural effusion. Cardiac size within normal limits. Pulmonary vascularity is normal. No acute bone abnormality. No chest tube identified within the visualized thorax. IMPRESSION: 1. Support tubes in appropriate position. 2. No chest tube identified within the visualized thorax. No pneumothorax. 3. Persistent retrocardiac atelectasis or infiltrate. Electronically Signed   By: Helyn Numbers M.D.   On: 04/14/2023 02:26   CARDIAC CATHETERIZATION  Result Date: 04/13/2023   Mid LAD lesion is 30% stenosed.   Ost LAD to Prox LAD lesion is 99% stenosed.   A drug-eluting stent was successfully placed  using a STENT ONYX FRONTIER 3.5X12.   Post intervention, there is a 0% residual stenosis.   There is mild left ventricular systolic dysfunction.   The left ventricular ejection fraction is 45-50% by visual estimate. 1.  High-grade in-stent restenosis ostial LAD 2.  Successful PCI with 3.5 x 12 mm Onyx frontier DES 3.  Right groin hematoma Recommendations 1.  Dual antiplatelet therapy uninterrupted 1 year 2.  Abdominal CT to rule out retroperitoneal bleed 3.  Further recommendations pending abdominal CT results   CT Angio Abd/Pel w/ and/or w/o  Result Date: 04/13/2023 CLINICAL DATA:  Suspected retroperitoneal hemorrhage from cath lab EXAM: CTA ABDOMEN AND PELVIS WITHOUT AND WITH CONTRAST TECHNIQUE: Multidetector CT imaging of the abdomen and pelvis was performed using the standard protocol during bolus administration of intravenous contrast. Multiplanar reconstructed images and MIPs were obtained and reviewed to evaluate the vascular anatomy. RADIATION DOSE REDUCTION: This exam was performed  according to the departmental dose-optimization program which includes automated exposure control, adjustment of the mA and/or kV according to patient size and/or use of iterative reconstruction technique. CONTRAST:  80mL OMNIPAQUE IOHEXOL 350 MG/ML SOLN COMPARISON:  CT abdomen pelvis, 12/13/2021 FINDINGS: VASCULAR Large right-sided retroperitoneal hematoma arising in the vicinity of the right external iliac artery and extending into the right paracolic gutter, dimensions at least 18.6 x 7.7 x 6.2 cm (series 15, image 44, series 10, image 139). Brisk arterial extravasation arising in the vicinity of the right inferior epigastric artery origin and distal portion of the right external iliac artery (series 10, image 161, series 15, image 44). Normal contour and caliber of the abdominal aorta. No evidence of aortic aneurysm, dissection, or other acute aortic pathology. 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 Moderate mixed calcific atherosclerosis. Review of the MIP images confirms the above findings. NON-VASCULAR Lower Chest: Moderate left, small right pleural effusions and associated atelectasis or consolidation. Bilateral breast implants small hiatal hernia. Hepatobiliary: No solid liver abnormality is seen. No gallstones, gallbladder wall thickening, or biliary dilatation. Pancreas: Unremarkable. No pancreatic ductal dilatation or surrounding inflammatory changes. Spleen: Normal in size without significant abnormality. Adrenals/Urinary Tract: Adrenal glands are unremarkable. Extensive, lobulated bilateral renal cortical scarring. No calculi or hydronephrosis. Excreted contrast within the collecting systems and bladder following catheterization. Foley catheter in the bladder. Stomach/Bowel: Stomach is within normal limits. Appendix appears normal. No evidence of bowel wall thickening, distention, or inflammatory changes. Sigmoid  diverticula. Lymphatic: No enlarged abdominal or pelvic lymph nodes. Reproductive: No mass or other significant abnormality. Other: No abdominal wall hernia or abnormality. Small volume hemoperitoneum about the liver and in the pelvis (series 10, image 156, series 5, image 17). Musculoskeletal: No acute osseous findings. IMPRESSION: 1. Large right-sided retroperitoneal hematoma arising in the vicinity of the right external iliac artery and extending into the right paracolic gutter, dimensions at least 18.6 x 7.7 x 6.2 cm. 2. Brisk arterial extravasation arising in the vicinity of the right inferior epigastric artery origin and distal portion of the right external iliac artery. 3. Small volume hemoperitoneum about the liver and in the pelvis. 4. Moderate left, small right pleural effusions and associated atelectasis or consolidation. These results were called by telephone at the time of interpretation on 04/13/2023 at 9:25 pm to [facility] PARASCHOS , who verbally acknowledged these results. Aortic Atherosclerosis (ICD10-I70.0). Electronically Signed   By: Jearld Lesch M.D.   On: 04/13/2023 21:38   US THORACENTESIS ASP PLEURAL SPACE  W/IMG GUIDE  Result Date: 04/13/2023 INDICATION: Patient with history of CAD s/p stenting, admitted with complaints of new chest pain and found to have large left pleural effusion. Request for diagnostic and therapeutic left thoracentesis. EXAM: ULTRASOUND GUIDED LEFT THORACENTESIS MEDICATIONS: 15 mL 1% lidocaine COMPLICATIONS: None immediate. PROCEDURE: An ultrasound guided thoracentesis was thoroughly discussed with the patient and questions answered. The benefits, risks, alternatives and complications were also discussed. The patient understands and wishes to proceed with the procedure. Written consent was obtained. Ultrasound was performed to localize and mark an adequate pocket of fluid in the left chest. The area was then prepped and draped in the normal sterile fashion. 1%  Lidocaine was used for local anesthesia. Under ultrasound guidance a 6 Fr Safe-T-Centesis catheter was introduced. Thoracentesis was performed. The catheter was removed and a dressing applied. FINDINGS: A total of approximately 650 mL of clear yellow fluid was removed. Samples were sent to the laboratory as requested by the clinical team. IMPRESSION: Successful ultrasound guided left thoracentesis yielding 650 mL of pleural fluid. Performed by Lynnette Caffey, PA-C Electronically Signed   By: Olive Bass M.D.   On: 04/13/2023 14:13   DG Chest Port 1 View  Result Date: 04/13/2023 CLINICAL DATA:  [id number] Pleural effusion on left [id number]. Status post thoracentesis. EXAM: PORTABLE CHEST 1 VIEW COMPARISON:  04/12/2023. FINDINGS: There is small left pleural effusion with probable associated compressive atelectatic changes in the left lung. There is interval improvement when compared to the prior exam, status post left thoracentesis. No left pneumothorax. Biapical pleural thickening/calcifications again seen. Bilateral lung fields are otherwise clear. There is subtle blunting of right lateral costophrenic angle, which may represent trace right pleural effusion. Normal cardio-mediastinal silhouette. No acute osseous abnormalities. The soft tissues are within normal limits. IMPRESSION: *Small left pleural effusion, improved since the prior study, status post left thoracentesis. No pneumothorax. Electronically Signed   By: Jules Schick M.D.   On: 04/13/2023 12:58   CT Angio Chest Pulmonary Embolism (PE) W or WO Contrast  Result Date: 04/12/2023 CLINICAL DATA:  Chest pain for 1 hour, hypertension EXAM: CT ANGIOGRAPHY CHEST WITH CONTRAST TECHNIQUE: Multidetector CT imaging of the chest was performed using the standard protocol during bolus administration of intravenous contrast. Multiplanar CT image reconstructions and MIPs were obtained to evaluate the vascular anatomy. RADIATION DOSE REDUCTION: This exam was  performed according to the departmental dose-optimization program which includes automated exposure control, adjustment of the mA and/or kV according to patient size and/or use of iterative reconstruction technique. CONTRAST:  60mL OMNIPAQUE IOHEXOL 350 MG/ML SOLN COMPARISON:  12/28/2020, 04/12/2023 FINDINGS: Cardiovascular: This is a technically adequate evaluation of the pulmonary vasculature. No filling defects or pulmonary emboli. The heart is unremarkable without pericardial effusion. Normal caliber of the thoracic aorta. Atherosclerosis of the aorta and coronary vasculature. Mediastinum/Nodes: No enlarged mediastinal, hilar, or axillary lymph nodes. Thyroid gland, trachea, and esophagus demonstrate no significant findings. Lungs/Pleura: Large left pleural effusion, volume estimated in excess of 1 L. dependent consolidation within the left lower lobe most consistent with atelectasis. There is a trace right pleural effusion. No airspace disease or pneumothorax. Bilateral bronchial wall thickening, greatest in the lower lobes. Upper Abdomen: No acute abnormality. Musculoskeletal: No acute or destructive bony abnormalities. Reconstructed images demonstrate no additional findings. Review of the MIP images confirms the above findings. IMPRESSION: 1. No evidence of pulmonary embolus. 2. Large left pleural effusion, volume estimated in excess of 1 L. Dependent left lower lobe atelectasis. 3. Trace right pleural  effusion. 4. Bilateral bronchial wall thickening, greatest in the lower lobes, which may reflect bronchitis or reactive airway disease. No evidence of pneumonia. 5. Aortic Atherosclerosis (ICD10-I70.0). Coronary artery atherosclerosis. Electronically Signed   By: Sharlet Salina M.D.   On: 04/12/2023 23:46   DG Chest 2 View  Result Date: 04/12/2023 CLINICAL DATA:  Chest pain.  Hypertension. EXAM: CHEST - 2 VIEW COMPARISON:  10/06/2021 FINDINGS: Midline trachea. Mild cardiomegaly. Increase in small to  moderate left pleural effusion. Biapical pleural thickening. No pneumothorax. No congestive failure. Worsened left lower lobe airspace disease. IMPRESSION: Increase in small to moderate left pleural effusion. Progressive adjacent left base airspace disease which could represent atelectasis or infection. Cardiomegaly without congestive failure. Electronically Signed   By: Jeronimo Greaves M.D.   On: 04/12/2023 16:58    ECHO as above  TELEMETRY reviewed by me 04/17/2023: sinus rhythm rate 70s  EKG reviewed by me: sinus rhythm rate 81 bpm  Data reviewed by me 04/17/2023: last 24h vitals tele labs imaging I/O PCCM notes, vascular surgery notes, nephrology note  Principal Problem:   Chest pain Active Problems:   CKD (chronic kidney disease) stage 3, GFR 30-59 ml/min (HCC)   Essential hypertension   Chronic diastolic heart failure (HCC)   NSTEMI (non-ST elevated myocardial infarction) (HCC)   Tobacco abuse    ASSESSMENT AND PLAN:  Dawn Bruce is a 77 y.o. female  with a past medical history of coronary artery disease s/p DES to LAD 02/2022, chronic systolic heart failure with recovered EF (previously EF 35-40% improved to > 55%), persistent atrial fibrillation on Eliquis s/p catheter ablation (03/2021), HTN, HLD, previous tobacco use, recurrent pleural effusions (L>R) previously requiring thoracentesis, skin cancer, carotid disease s/p R CEA, CKD stage 3 who presented to the ED on 04/12/2023 for chest pain. Cardiology was consulted for further evaluation.   # Retroperitoneal hematoma Patient developed hematoma following LHC and underwent surgical repair of external iliac overnight on 11/23.  -Hgb remains stable.  -Vascular surgery following, wound vac in place.   # NSTEMI # Coronary artery disease s/p DES to LAD 02/2022 # Ischemic cardiomyopathy # Chronic HFrEF, previously recovered now reduced to 25-30% Patient reports onset of chest pain yesterday morning while at home.  Blood pressure also  elevated at that time.  Episode similar to priors when she required stenting.  Troponins trended 27  > 609 > 979 > 1954.  EKG with normal sinus rhythm, nonacute. Echo this admission with EF 25-30%, + WMA. LHC 11/22 with 99% ostial to prox LAD lesion s/p DES with 3.5 x 12 mm Onyx frontier.  -Continue aspirin 81 mg daily, Plavix 75 mg daily, atorvastatin 20 mg daily, Zetia 10 mg daily. Will DC aspirin in 2 weeks as below.  -Continue carvedilol 3.125 mg twice daily, hydralazine 25 mg bid, and imdur 30 mg daily for GDMT given renal function elevated. Further adjustments pending BP and renal function.  -Will set patient up with LifeVest.  # Paroxysmal atrial fibrillation Patient with hx of PAF on chronic eliquis 2.5 mg bid.  -Continue eliquis 2.5 mg twice daily.  -Will plan for triple therapy x2 weeks then discontinue aspirin.    # L pleural effusion Patient with hx of chronic recurrent L pleural effusion noted to be large on CT chest.  -S/p thoracentesis 11/22 with 650 mL removed. -Management per primary.  # Chronic kidney disease stage IIIa Cr 1.29 on admission, up to 2.63 yesterday now 2.08 on AM labs today.  -Nephrology evaluated  patient yesterday. -Continue to monitor closely.   This patient's plan of care was discussed and created with Dr. Juliann Pares and he is in agreement.  Signed: Gale Journey, PA-C  04/17/2023, 8:29 AM [facility]

## 2023-04-18 ENCOUNTER — Inpatient Hospital Stay: Payer: Medicare Other

## 2023-04-18 DIAGNOSIS — I214 Non-ST elevation (NSTEMI) myocardial infarction: Secondary | ICD-10-CM | POA: Diagnosis not present

## 2023-04-18 LAB — CBC
HCT: 30.5 % — ABNORMAL LOW (ref 36.0–46.0)
Hemoglobin: 10.2 g/dL — ABNORMAL LOW (ref 12.0–15.0)
MCH: 28.4 pg (ref 26.0–34.0)
MCHC: 33.4 g/dL (ref 30.0–36.0)
MCV: 85 fL (ref 80.0–100.0)
Platelets: 136 K/uL — ABNORMAL LOW (ref 150–400)
RBC: 3.59 MIL/uL — ABNORMAL LOW (ref 3.87–5.11)
RDW: 17.3 % — ABNORMAL HIGH (ref 11.5–15.5)
WBC: 14 K/uL — ABNORMAL HIGH (ref 4.0–10.5)
nRBC: 0 % (ref 0.0–0.2)

## 2023-04-18 LAB — RENAL FUNCTION PANEL
Albumin: 2.9 g/dL — ABNORMAL LOW (ref 3.5–5.0)
Anion gap: 8 (ref 5–15)
BUN: 32 mg/dL — ABNORMAL HIGH (ref 8–23)
CO2: 24 mmol/L (ref 22–32)
Calcium: 8.3 mg/dL — ABNORMAL LOW (ref 8.9–10.3)
Chloride: 105 mmol/L (ref 98–111)
Creatinine, Ser: 1.7 mg/dL — ABNORMAL HIGH (ref 0.44–1.00)
GFR, Estimated: 30 mL/min — ABNORMAL LOW
Glucose, Bld: 128 mg/dL — ABNORMAL HIGH (ref 70–99)
Phosphorus: 2.2 mg/dL — ABNORMAL LOW (ref 2.5–4.6)
Potassium: 3.7 mmol/L (ref 3.5–5.1)
Sodium: 137 mmol/L (ref 135–145)

## 2023-04-18 LAB — COMPREHENSIVE METABOLIC PANEL
ALT: 461 U/L — ABNORMAL HIGH (ref 0–44)
AST: 484 U/L — ABNORMAL HIGH (ref 15–41)
Albumin: 2.9 g/dL — ABNORMAL LOW (ref 3.5–5.0)
Alkaline Phosphatase: 75 U/L (ref 38–126)
Anion gap: 8 (ref 5–15)
BUN: 32 mg/dL — ABNORMAL HIGH (ref 8–23)
CO2: 25 mmol/L (ref 22–32)
Calcium: 8 mg/dL — ABNORMAL LOW (ref 8.9–10.3)
Chloride: 104 mmol/L (ref 98–111)
Creatinine, Ser: 1.62 mg/dL — ABNORMAL HIGH (ref 0.44–1.00)
GFR, Estimated: 32 mL/min — ABNORMAL LOW (ref >60)
Glucose, Bld: 134 mg/dL — ABNORMAL HIGH (ref 70–99)
Potassium: 3.7 mmol/L (ref 3.5–5.1)
Sodium: 137 mmol/L (ref 135–145)
Total Bilirubin: 2.5 mg/dL — ABNORMAL HIGH (ref <1.2)
Total Protein: 5.7 g/dL — ABNORMAL LOW (ref 6.5–8.1)

## 2023-04-18 LAB — MAGNESIUM: Magnesium: 2.3 mg/dL (ref 1.7–2.4)

## 2023-04-18 LAB — CYTOLOGY - NON PAP

## 2023-04-18 MED ORDER — PANTOPRAZOLE SODIUM 40 MG PO TBEC
40.0000 mg | DELAYED_RELEASE_TABLET | Freq: Every day | ORAL | Status: DC
  Administered 2023-04-19 – 2023-04-20 (×2): 40 mg via ORAL
  Filled 2023-04-18 (×2): qty 1

## 2023-04-18 MED ORDER — MELATONIN 5 MG PO TABS
5.0000 mg | ORAL_TABLET | Freq: Once | ORAL | Status: AC
  Administered 2023-04-18: 5 mg via ORAL
  Filled 2023-04-18: qty 1

## 2023-04-18 MED ORDER — ISOSORBIDE MONONITRATE ER 60 MG PO TB24
60.0000 mg | ORAL_TABLET | Freq: Every day | ORAL | Status: DC
  Administered 2023-04-18 – 2023-04-20 (×3): 60 mg via ORAL
  Filled 2023-04-18 (×4): qty 1

## 2023-04-18 MED ORDER — HYDRALAZINE HCL 20 MG/ML IJ SOLN
5.0000 mg | Freq: Once | INTRAMUSCULAR | Status: AC
  Administered 2023-04-18: 5 mg via INTRAVENOUS
  Filled 2023-04-18: qty 1

## 2023-04-18 MED ORDER — HYDROCODONE-ACETAMINOPHEN 5-325 MG PO TABS
1.0000 | ORAL_TABLET | ORAL | Status: DC | PRN
  Administered 2023-04-18 – 2023-04-20 (×7): 1 via ORAL
  Filled 2023-04-18 (×6): qty 1

## 2023-04-18 MED ORDER — HYDRALAZINE HCL 20 MG/ML IJ SOLN
20.0000 mg | Freq: Four times a day (QID) | INTRAMUSCULAR | Status: DC | PRN
  Administered 2023-04-18: 20 mg via INTRAVENOUS
  Filled 2023-04-18: qty 1

## 2023-04-18 MED ORDER — K PHOS MONO-SOD PHOS DI & MONO 155-852-130 MG PO TABS
250.0000 mg | ORAL_TABLET | Freq: Once | ORAL | Status: AC
  Administered 2023-04-18: 250 mg via ORAL
  Filled 2023-04-18: qty 1

## 2023-04-18 MED ORDER — ENSURE ENLIVE PO LIQD
237.0000 mL | Freq: Two times a day (BID) | ORAL | Status: DC
  Administered 2023-04-19 – 2023-04-20 (×2): 237 mL via ORAL

## 2023-04-18 MED ORDER — CARVEDILOL 6.25 MG PO TABS
6.2500 mg | ORAL_TABLET | Freq: Two times a day (BID) | ORAL | Status: DC
  Administered 2023-04-18 – 2023-04-20 (×4): 6.25 mg via ORAL
  Filled 2023-04-18 (×4): qty 1

## 2023-04-18 NOTE — Progress Notes (Signed)
PHARMACY CONSULT NOTE  Pharmacy Consult for Electrolyte Monitoring and Replacement   Recent Labs: Potassium (mmol/L)  Date Value  04/18/2023 3.7  04/18/2023 3.7   Magnesium (mg/dL)  Date Value  13/12/6576 2.3   Calcium (mg/dL)  Date Value  46/96/2952 8.3 (L)  04/18/2023 8.0 (L)   Albumin (g/dL)  Date Value  84/13/2440 2.9 (L)  04/18/2023 2.9 (L)  06/01/2022 3.8   Phosphorus (mg/dL)  Date Value  03/18/2535 2.2 (L)   Sodium (mmol/L)  Date Value  04/18/2023 137  04/18/2023 137  06/01/2022 147 (H)   Corrected Ca: 8.4 mg/dL  Assessment:  77 y.o. female  with a past medical history of chronic systolic heart failure with recovered EF (previously EF 35-40% improved to > 55%), persistent atrial fibrillation on Eliquis s/p catheter ablation (03/2021), HTN, HLD, previous tobacco use, recurrent pleural effusions (L>R) previously requiring thoracentesis, skin cancer, carotid disease s/p R CEA, CKD stage 3 who presented to the ED on 04/12/2023 for chest pain. Pharmacy is asked to follow and replace electrolytes while in CCU  Goal of Therapy:  Electrolytes WNL  Plan:  K-phos neutral 250 mg po x 1 (contains phosphorus 8 mMol, potassium 1.1 mEq) Because this consult was generated as part of an ICU order set and patient is transferring pharmacy will sign off for now Please feel free to reach out if any further assistance is needed  Lowella Bandy ,PharmD Clinical Pharmacist 04/18/2023 8:06 AM

## 2023-04-18 NOTE — Plan of Care (Addendum)
BP around 170-180 systolic. 5 mg of hydralazine given as ordered,. Problem: Education: Goal: Understanding of cardiac disease, CV risk reduction, and recovery process will improve Outcome: Progressing   Problem: Activity: Goal: Ability to tolerate increased activity will improve Outcome: Progressing   Problem: Cardiac: Goal: Ability to achieve and maintain adequate cardiovascular perfusion will improve Outcome: Progressing   Problem: Education: Goal: Understanding of CV disease, CV risk reduction, and recovery process will improve Outcome: Progressing   Problem: Activity: Goal: Ability to return to baseline activity level will improve Outcome: Progressing   Problem: Cardiovascular: Goal: Ability to achieve and maintain adequate cardiovascular perfusion will improve Outcome: Progressing   Problem: Activity: Goal: Risk for activity intolerance will decrease Outcome: Progressing

## 2023-04-18 NOTE — Progress Notes (Signed)
Physical Therapy Treatment Patient Details Name: Dawn Bruce MRN: [id number] DOB: 13-Jan-1946 Today's Date: 04/18/2023   History of Present Illness Patient is a 77 year old female presenting with chest pain, NSTEMI. S/p LHC with LAD stenting, post-op required vasopressor support. Right groin exploration with repair of right external iliac artery and right circumflex artery    PT Comments  Patient is agreeable to PT session. She reports fatigue from no sleep last night. Less pain reported today than yesterday. Patient was able to get OOB and perform transfer to bed side commode with Min A using rolling walker. Patient declined walking at this time and requesting to return to bed. Vitals stable throughout session. PT will continue to follow.    If plan is discharge home, recommend the following: A little help with walking and/or transfers;A little help with bathing/dressing/bathroom;Help with stairs or ramp for entrance;Assist for transportation   Can travel by private vehicle        Equipment Recommendations  None recommended by PT    Recommendations for Other Services       Precautions / Restrictions Precautions Precautions: Fall Restrictions Weight Bearing Restrictions: No     Mobility  Bed Mobility Overal bed mobility: Needs Assistance Bed Mobility: Supine to Sit, Sit to Supine     Supine to sit: Min assist Sit to supine: Min assist   General bed mobility comments: cues for sequencing for technique. increased time and effort required    Transfers Overall transfer level: Needs assistance Equipment used: Rolling walker (2 wheels) Transfers: Bed to chair/wheelchair/BSC     Step pivot transfers: Min assist       General transfer comment: Min A for lifting assistance for standing. verbal cues for technique and hand placement    Ambulation/Gait               General Gait Details: patient declined walking at this time due to fatigue from little sleep last  night   Stairs             Wheelchair Mobility     Tilt Bed    Modified Rankin (Stroke Patients Only)       Balance Overall balance assessment: Needs assistance Sitting-balance support: Feet supported Sitting balance-Leahy Scale: Good                                      Cognition Arousal: Alert Behavior During Therapy: WFL for tasks assessed/performed Overall Cognitive Status: Within Functional Limits for tasks assessed                                          Exercises      General Comments General comments (skin integrity, edema, etc.): assistance required for hygiene after toileting tasks      Pertinent Vitals/Pain Pain Assessment Pain Assessment: Faces Faces Pain Scale: Hurts a little bit Pain Location: RLE Pain Descriptors / Indicators: Burning, Discomfort Pain Intervention(s): Limited activity within patient's tolerance, Monitored during session, Repositioned    Home Living                          Prior Function            PT Goals (current goals can now be found in the care plan section) Acute  Rehab PT Goals Patient Stated Goal: to go home PT Goal Formulation: With patient/family Time For Goal Achievement: 05/01/23 Potential to Achieve Goals: Good Progress towards PT goals: Progressing toward goals    Frequency    Min 1X/week      PT Plan      Co-evaluation              AM-PAC PT "6 Clicks" Mobility   Outcome Measure  Help needed turning from your back to your side while in a flat bed without using bedrails?: A Little Help needed moving from lying on your back to sitting on the side of a flat bed without using bedrails?: A Lot Help needed moving to and from a bed to a chair (including a wheelchair)?: A Little Help needed standing up from a chair using your arms (e.g., wheelchair or bedside chair)?: A Little Help needed to walk in hospital room?: A Little Help needed climbing  3-5 steps with a railing? : A Little 6 Click Score: 17    End of Session   Activity Tolerance: Patient tolerated treatment well Patient left: in bed;with call bell/phone within reach;with bed alarm set Nurse Communication: Mobility status PT Visit Diagnosis: Unsteadiness on feet (R26.81);Muscle weakness (generalized) (M62.81)     Time: 8469-6295 PT Time Calculation (min) (ACUTE ONLY): 15 min  Charges:    $Therapeutic Activity: 8-22 mins PT General Charges $$ ACUTE PT VISIT: 1 Visit                     Donna Bernard, PT, MPT\  Ina Homes 04/18/2023, 8:15 AM

## 2023-04-18 NOTE — Progress Notes (Signed)
PROGRESS NOTE    Dawn Bruce  ZOX:[id number] DOB: 1945/09/08 DOA: 04/12/2023 PCP: Erasmo Downer, MD    Assessment & Plan:   Principal Problem:   Chest pain Active Problems:   NSTEMI (non-ST elevated myocardial infarction) (HCC)   CKD (chronic kidney disease) stage 3, GFR 30-59 ml/min (HCC)   Essential hypertension   Chronic diastolic heart failure (HCC)   Tobacco abuse  Assessment and Plan: Retroperitoneal hematoma & hemorrhagic shock: resolved. Complication of Iliac artery laceration during left heart cath. Underwent surgical repair of external iliac on 11/23. Will continue to monitor H&H. Vascular surgery following . Will need to f/u outpatient f/u in 3 weeks to remove staples R groin. Wound vac in place plan for 7-10 days total   NSTEMI:. LHC 11/22 with 99% ostial to prox LAD lesion s/p DES with 3.5 x 12 mm Onyx frontier.  Continue on aspirin, plavix, statin, zetia, coreg, imdur, hydralazine as per cardio. Hx of CAD & ischemic cardiomyopathy.   Chronic systolic CHF: EF 81-19%. Continue on coreg, imdur, statin. Needs a lifevest as per cardio. Cardio following and recs apprec   PAF: continue on eliquis, plavix & aspirin x 2 weeks & then d/c aspirin.    Recurrent L pleural effusion: s/p thoracentesis 11/22 with 650 mL removed.   CKDIIIa: Cr is trending down from day prior. Avoid nephrotoxic meds. Nephro following and recs apprec   Chronic pain: continue w/ home fentanyl patch       DVT prophylaxis:  eliquis  Code Status: full  Family Communication: discussed pt's care w/ pt's family at bedside and answered their questions  Disposition Plan: likely d/c back home w/ HH   Level of care: Telemetry Cardiac Consultants:  Vasc surg Cardio  Nephro   Procedures:   Antimicrobials:    Subjective: Pt c/o not sleeping well   Objective: Vitals:   04/18/23 0825 04/18/23 0900 04/18/23 0957 04/18/23 1000  BP: (!) 175/80 (!) 146/64 (!) 122/56 (!) 141/63  Pulse: 87  94 (!) 101 (!) 101  Resp: 14 14 18 20   Temp:      TempSrc:      SpO2: 98% 97% 98% 98%  Weight:      Height:        Intake/Output Summary (Last 24 hours) at 04/18/2023 1148 Last data filed at 04/18/2023 0548 Gross per 24 hour  Intake 660 ml  Output 1100 ml  Net -440 ml   Filed Weights   04/12/23 1518 04/13/23 1645 04/15/23 0400  Weight: 61.2 kg 58.1 kg 54.4 kg    Examination:  General exam: Appears calm and comfortable  Respiratory system: Clear to auscultation. Respiratory effort normal. Cardiovascular system: S1 & S2 +. No rubs, gallops or clicks Gastrointestinal system: Abdomen is nondistended, soft and nontender.  Normal bowel sounds heard. Central nervous system: Alert and oriented. Moves all extremities  Psychiatry: Judgement and insight appear normal. Flat mood and affect    Data Reviewed: I have personally reviewed following labs and imaging studies  CBC: Recent Labs  Lab 04/13/23 0415 04/14/23 0044 04/14/23 1621 04/15/23 0420 04/15/23 1409 04/16/23 0151 04/16/23 0356 04/16/23 1446 04/16/23 2113 04/17/23 0443 04/18/23 0320  WBC 9.5   < > 16.9* 11.9*  --  10.2 10.8*  --   --  9.8 14.0*  NEUTROABS 8.1*  --  14.3* 10.0*  --  8.4* 9.0*  --   --   --   --   HGB 14.3   < > 10.4* 9.5*   < >  9.2* 9.1* 9.0* 8.7* 8.5* 10.2*  HCT 43.6   < > 29.1* 27.1*   < > 26.8* 26.7* 26.8* 25.4* 24.9* 30.5*  MCV 86.9   < > 84.3 84.7  --  83.5 83.4  --   --  83.0 85.0  PLT 192   < > 92* 81*  --  65* 65*  --   --  75* 136*   < > = values in this interval not displayed.   Basic Metabolic Panel: Recent Labs  Lab 04/13/23 0415 04/14/23 0542 04/15/23 0420 04/15/23 1223 04/16/23 0356 04/17/23 0443 04/18/23 0320  NA 142 140 140  --  137 139 137  137  K 3.9 4.7 3.2* 3.5 3.7 3.3* 3.7  3.7  CL 107 109 104  --  102 104 105  104  CO2 21* 18* 26  --  25 28 24  25   GLUCOSE 124* 182* 112*  --  101* 99 128*  134*  BUN 24* 28* 39*  --  43* 38* 32*  32*  CREATININE 1.24*  1.63* 2.06*  --  2.63* 2.08* 1.70*  1.62*  CALCIUM 9.3 6.8* 6.9*  --  7.4* 7.7* 8.3*  8.0*  MG 2.0  --  1.4* 2.0 2.1 2.1 2.3  PHOS 3.9  --  4.6  --  4.4 3.2 2.2*   GFR: Estimated Creatinine Clearance: 22.2 mL/min (A) (by C-G formula based on SCr of 1.7 mg/dL (H)). Liver Function Tests: Recent Labs  Lab 04/13/23 0415 04/14/23 0624 04/15/23 0420 04/16/23 0356 04/17/23 0443 04/18/23 0320  AST 38 131* 909* 530*  --  484*  ALT 21 59* 477* 325*  --  461*  ALKPHOS 86 45 41 47  --  75  BILITOT 1.0 1.8* 1.0 1.5*  --  2.5*  PROT 6.9 4.0* 3.9* 4.8*  --  5.7*  ALBUMIN 3.6 2.5* 2.4* 2.8* 2.5* 2.9*  2.9*   No results for input(s): "LIPASE", "AMYLASE" in the last 168 hours. No results for input(s): "AMMONIA" in the last 168 hours. Coagulation Profile: Recent Labs  Lab 04/13/23 0415 04/16/23 0151  INR 1.1 1.2   Cardiac Enzymes: No results for input(s): "CKTOTAL", "CKMB", "CKMBINDEX", "TROPONINI" in the last 168 hours. BNP (last 3 results) No results for input(s): "PROBNP" in the last 8760 hours. HbA1C: No results for input(s): "HGBA1C" in the last 72 hours. CBG: Recent Labs  Lab 04/13/23 2138  GLUCAP 130*   Lipid Profile: No results for input(s): "CHOL", "HDL", "LDLCALC", "TRIG", "CHOLHDL", "LDLDIRECT" in the last 72 hours. Thyroid Function Tests: No results for input(s): "TSH", "T4TOTAL", "FREET4", "T3FREE", "THYROIDAB" in the last 72 hours. Anemia Panel: No results for input(s): "VITAMINB12", "FOLATE", "FERRITIN", "TIBC", "IRON", "RETICCTPCT" in the last 72 hours. Sepsis Labs: Recent Labs  Lab 04/14/23 0124 04/14/23 0544 04/14/23 0839 04/14/23 1146  LATICACIDVEN 4.3* 6.1* 5.5* 2.6*    Recent Results (from the past 240 hour(s))  Body fluid culture w Gram Stain     Status: None   Collection Time: 04/13/23 11:47 AM   Specimen: PATH Cytology Pleural fluid  Result Value Ref Range Status   Specimen Description   Final    PLEURAL Performed at [facility],  [address], [place], [place] [address]    Special Requests   Final    NONE Performed at [facility], [address] [place], [place] [address]    Gram Stain   Final    WBC PRESENT, PREDOMINANTLY MONONUCLEAR NO ORGANISMS SEEN CYTOSPIN  SMEAR    Culture   Final    NO GROWTH 3 DAYS Performed at [facility], [address], [place], [place] [address]    Report Status 04/16/2023 FINAL  Final  MRSA Next Gen by PCR, Nasal     Status: None   Collection Time: 04/13/23 11:00 PM   Specimen: Nasal Mucosa; Nasal Swab  Result Value Ref Range Status   MRSA by PCR Next Gen NOT DETECTED NOT DETECTED Final    Comment: (NOTE) The GeneXpert MRSA Assay (FDA approved for NASAL specimens only), is one component of a comprehensive MRSA colonization surveillance program. It is not intended to diagnose MRSA infection nor to guide or monitor treatment for MRSA infections. Test performance is not FDA approved in patients less than 98 years old. Performed at [facility], [address] [place], [place] [address]          Radiology Studies: No results found.      Scheduled Meds:  sodium chloride   Intravenous Once   sodium chloride   Intravenous Once   sodium chloride   Intravenous Once   apixaban  2.5 mg Oral BID   aspirin  81 mg Per Tube Daily   atorvastatin  80 mg Oral Daily   carvedilol  6.25 mg Oral BID WC   Chlorhexidine Gluconate Cloth  6 each Topical Daily   clopidogrel  75 mg Per Tube Q breakfast   ezetimibe  10 mg Per Tube Daily   fentaNYL  1 patch Transdermal Q72H   folic acid  1 mg Oral Daily   hydrALAZINE  25 mg Oral BID   isosorbide mononitrate  60 mg Oral Daily   multivitamin with minerals  1 tablet Oral Daily   ondansetron (ZOFRAN) IV  4 mg Intravenous Once   mouth rinse  15 mL Mouth Rinse 4 times per day   pantoprazole (PROTONIX) IV  40 mg Intravenous Daily   sodium chloride flush  10-40 mL Intracatheter Q12H   thiamine  100 mg Per  Tube Daily   Or   thiamine  100 mg Intravenous Daily   Continuous Infusions:   LOS: 6 days       Charise Killian, MD [facility] Pager [phone]  If 7PM-7AM, please contact night-coverage [email or web address] 04/18/2023, 11:48 AM

## 2023-04-18 NOTE — Progress Notes (Signed)
[facility] [place], [place] 04/18/23  Subjective:   Hospital day # 6   Renal: 11/26 0701 - 11/27 0700 In: 930 [P.O.:900; I.V.:30] Out: 1100 [Urine:1100] Lab Results  Component Value Date   CREATININE 1.70 (H) 04/18/2023   CREATININE 1.62 (H) 04/18/2023   CREATININE 2.08 (H) 04/17/2023   Patient states that she had a rough night.  Could not sleep very well. States that she was voiding frequently and had to go to bathroom overnight.  Objective:  Vital signs in last 24 hours:  Temp:  [98.2 F (36.8 C)] 98.2 F (36.8 C) (11/27 0200) Pulse Rate:  [82-102] 91 (11/27 1440) Resp:  [12-22] 14 (11/27 1440) BP: (122-182)/(53-97) 136/68 (11/27 1400) SpO2:  [92 %-99 %] 98 % (11/27 1440) FiO2 (%):  [21 %] 21 % (11/27 0600)  Weight change:  Filed Weights   04/12/23 1518 04/13/23 1645 04/15/23 0400  Weight: 61.2 kg 58.1 kg 54.4 kg    Intake/Output:    Intake/Output Summary (Last 24 hours) at 04/18/2023 1528 Last data filed at 04/18/2023 1400 Gross per 24 hour  Intake 680 ml  Output 1500 ml  Net -820 ml     Physical Exam: General: No acute distress, laying in the bed.  HEENT Moist oral mucous membranes  Pulm/lungs Normal breathing effort on room air  CVS/Heart Regular  Abdomen:  Soft, nontender  Extremities: Trace edema left leg edema  Neurologic: Alert, oriented  Skin: Warm, dry  Access:        Basic Metabolic Panel:  Recent Labs  Lab 04/13/23 0415 04/14/23 0542 04/15/23 0420 04/15/23 1223 04/16/23 0356 04/17/23 0443 04/18/23 0320  NA 142 140 140  --  137 139 137  137  K 3.9 4.7 3.2* 3.5 3.7 3.3* 3.7  3.7  CL 107 109 104  --  102 104 105  104  CO2 21* 18* 26  --  25 28 24  25   GLUCOSE 124* 182* 112*  --  101* 99 128*  134*  BUN 24* 28* 39*  --  43* 38* 32*  32*  CREATININE 1.24* 1.63* 2.06*  --  2.63* 2.08* 1.70*  1.62*  CALCIUM 9.3 6.8* 6.9*  --  7.4* 7.7* 8.3*  8.0*  MG 2.0  --  1.4* 2.0 2.1 2.1 2.3  PHOS 3.9  --  4.6   --  4.4 3.2 2.2*     CBC: Recent Labs  Lab 04/13/23 0415 04/14/23 0044 04/14/23 1621 04/15/23 0420 04/15/23 1409 04/16/23 0151 04/16/23 0356 04/16/23 1446 04/16/23 2113 04/17/23 0443 04/18/23 0320  WBC 9.5   < > 16.9* 11.9*  --  10.2 10.8*  --   --  9.8 14.0*  NEUTROABS 8.1*  --  14.3* 10.0*  --  8.4* 9.0*  --   --   --   --   HGB 14.3   < > 10.4* 9.5*   < > 9.2* 9.1* 9.0* 8.7* 8.5* 10.2*  HCT 43.6   < > 29.1* 27.1*   < > 26.8* 26.7* 26.8* 25.4* 24.9* 30.5*  MCV 86.9   < > 84.3 84.7  --  83.5 83.4  --   --  83.0 85.0  PLT 192   < > 92* 81*  --  65* 65*  --   --  75* 136*   < > = values in this interval not displayed.     No results found for: "HEPBSAG", "HEPBSAB", "HEPBIGM"    Microbiology:  Recent Results (from the  past 240 hour(s))  Body fluid culture w Gram Stain     Status: None   Collection Time: 04/13/23 11:47 AM   Specimen: PATH Cytology Pleural fluid  Result Value Ref Range Status   Specimen Description   Final    PLEURAL Performed at [facility], [address] [place], [place] [address]    Special Requests   Final    NONE Performed at [facility], [address] [place], [place] [address]    Gram Stain   Final    WBC PRESENT, PREDOMINANTLY MONONUCLEAR NO ORGANISMS SEEN CYTOSPIN SMEAR    Culture   Final    NO GROWTH 3 DAYS Performed at [facility], [address] [place], [place] [address]    Report Status 04/16/2023 FINAL  Final  MRSA Next Gen by PCR, Nasal     Status: None   Collection Time: 04/13/23 11:00 PM   Specimen: Nasal Mucosa; Nasal Swab  Result Value Ref Range Status   MRSA by PCR Next Gen NOT DETECTED NOT DETECTED Final    Comment: (NOTE) The GeneXpert MRSA Assay (FDA approved for NASAL specimens only), is one component of a comprehensive MRSA colonization surveillance program. It is not intended to diagnose MRSA infection nor to guide or monitor treatment for MRSA infections. Test performance is  not FDA approved in patients less than 7 years old. Performed at [facility], [address] [place], [place] [address]     Coagulation Studies: Recent Labs    04/16/23 0151  LABPROT 15.6*  INR 1.2    Urinalysis: Recent Labs    04/17/23 0443  COLORURINE YELLOW*  LABSPEC 1.012  PHURINE 6.0  GLUCOSEU NEGATIVE  HGBUR SMALL*  BILIRUBINUR NEGATIVE  KETONESUR NEGATIVE  PROTEINUR NEGATIVE  NITRITE NEGATIVE  LEUKOCYTESUR TRACE*      Imaging: No results found.   Medications:     sodium chloride   Intravenous Once   sodium chloride   Intravenous Once   sodium chloride   Intravenous Once   apixaban  2.5 mg Oral BID   aspirin  81 mg Per Tube Daily   atorvastatin  80 mg Oral Daily   carvedilol  6.25 mg Oral BID WC   Chlorhexidine Gluconate Cloth  6 each Topical Daily   clopidogrel  75 mg Per Tube Q breakfast   ezetimibe  10 mg Per Tube Daily   feeding supplement  237 mL Oral BID BM   fentaNYL  1 patch Transdermal Q72H   folic acid  1 mg Oral Daily   hydrALAZINE  25 mg Oral BID   isosorbide mononitrate  60 mg Oral Daily   multivitamin with minerals  1 tablet Oral Daily   ondansetron (ZOFRAN) IV  4 mg Intravenous Once   mouth rinse  15 mL Mouth Rinse 4 times per day   [START ON 04/19/2023] pantoprazole  40 mg Oral Daily   sodium chloride flush  10-40 mL Intracatheter Q12H   thiamine  100 mg Per Tube Daily   acetaminophen, fentaNYL (SUBLIMAZE) injection, hydrALAZINE, HYDROcodone-acetaminophen, nitroGLYCERIN, ondansetron (ZOFRAN) IV, mouth rinse, polyvinyl alcohol, sodium chloride flush  Assessment/ Plan:  77 y.o. female with  medical problems of  hypertension, heart failure with preserved ejection fraction, history of pleural effusions, history of diffuse vascular disease including coronary artery disease-history of PCI in LAD stent October 2023, chronic kidney disease, prediabetes, atrial fibrillation status post cardioversion in 2022   admitted on  04/12/2023 for Status post thoracentesis [  Z98.890] NSTEMI (non-ST elevated myocardial infarction) (HCC) [I21.4] Hypertensive emergency [I16.1] Chest pain [R07.9]  1.  Acute kidney injury on chronic kidney disease stage IIIb Baseline creatinine of 1.2/GFR 44 from 04/13/2023 Creatinine appears to have peaked and has started to improve.  Today's creatinine is down to 1.70 Acute kidney injury most likely secondary to ATN due to cardiogenic shock, hypotension from complications of cardiac catheterization and IV contrast exposure on April 13, 2023. Renal ultrasound-04/15/2023-no acute abnormality. Urinalysis-04/17/2023-trace leukocytes, 0 RBCs, negative for protein. At present she is hemodynamically stable. Volume status, respiratory status and electrolytes are acceptable.  No acute indication for dialysis at present. We will continue to monitor and follow along during hospitalization. -Avoid nephrotoxins including IV contrast, nonsteroidals. -Maintain hemodynamic support and avoid hypotension     LOS: 6 Taelon Bendorf 11/27/20243:28 PM  [facility] [place], [place] [phone]  Note: This note was prepared with Dragon dictation. Any transcription errors are unintentional

## 2023-04-18 NOTE — Progress Notes (Signed)
Occupational Therapy Treatment Patient Details Name: Dawn Bruce MRN: [id number] DOB: 1945/09/06 Today's Date: 04/18/2023   History of present illness Patient is a 77 year old female presenting with chest pain, NSTEMI. S/p LHC with LAD stenting, post-op required vasopressor support. Right groin exploration with repair of right external iliac artery and right circumflex artery   OT comments  Chart reviewed prior to tx session. Pt seen for OT treatment on this date. Upon arrival to room pt awake in recliner with husband at beside. Tx session targeted improved ADL activity tolerance, increased independence, and positioning. Pt was alert and oriented throughout session, stated 6/10 pain level in RLE - RN aware. Pt requires CGA for STS and amb to sink with use of RW. Pt completed standing grooming tasks at sink level with CGA. Amb ~59ft during tx. VSS, RN in room to provide medication. Pt making good progress toward goals, will continue to follow POC. Discharge recommendation remains appropriate. OT will follow acutely.           If plan is discharge home, recommend the following:  A little help with walking and/or transfers;A little help with bathing/dressing/bathroom;Assistance with cooking/housework;Direct supervision/assist for medications management;Assist for transportation;Help with stairs or ramp for entrance   Equipment Recommendations  BSC/3in1    Recommendations for Other Services      Precautions / Restrictions Precautions Precautions: Fall Restrictions Weight Bearing Restrictions: No       Mobility Bed Mobility               General bed mobility comments: NT pt in recliner pre/post session    Transfers Overall transfer level: Needs assistance Equipment used: Rolling walker (2 wheels) Transfers: Sit to/from Stand Sit to Stand: Contact guard assist           General transfer comment: Verbal/visual cues for STS technique with RW and hand placement throughout      Balance Overall balance assessment: Needs assistance Sitting-balance support: Feet supported Sitting balance-Leahy Scale: Good     Standing balance support: Reliant on assistive device for balance, During functional activity, Single extremity supported Standing balance-Leahy Scale: Fair Standing balance comment: completed sink level grooming tasks with single UE support                           ADL either performed or assessed with clinical judgement   ADL Overall ADL's : Needs assistance/impaired     Grooming: Wash/dry face;Wash/dry hands;Oral care;Cueing for safety;Standing;Contact guard assist Grooming Details (indicate cue type and reason): Standing at sink level                 Toilet Transfer: Contact guard assist;Rolling walker (2 wheels);Ambulation Toilet Transfer Details (indicate cue type and reason): simulated returing to recliner         Functional mobility during ADLs: Contact guard assist;Rolling walker (2 wheels)      Extremity/Trunk Assessment              Vision       Perception     Praxis      Cognition Arousal: Alert Behavior During Therapy: WFL for tasks assessed/performed Overall Cognitive Status: Within Functional Limits for tasks assessed                                 General Comments: alert and oriented x4        Exercises Other  Exercises Other Exercises: Edu re: DME management during mob/transfers, positioning preventing further edema in extermities    Shoulder Instructions       General Comments Pt amb to sink with RW + CGA ~28ft    Pertinent Vitals/ Pain       Pain Assessment Pain Assessment: 0-10 Pain Score: 6  Pain Location: RLE Pain Descriptors / Indicators: Burning, Discomfort Pain Intervention(s): Limited activity within patient's tolerance, Monitored during session, RN gave pain meds during session  Home Living                                           Prior Functioning/Environment              Frequency  Min 1X/week        Progress Toward Goals  OT Goals(current goals can now be found in the care plan section)  Progress towards OT goals: Progressing toward goals  Acute Rehab OT Goals Patient Stated Goal: get stronger OT Goal Formulation: With patient Time For Goal Achievement: 05/01/23 Potential to Achieve Goals: Good  Plan      Co-evaluation                 AM-PAC OT "6 Clicks" Daily Activity     Outcome Measure   Help from another person eating meals?: None Help from another person taking care of personal grooming?: A Little Help from another person toileting, which includes using toliet, bedpan, or urinal?: A Little Help from another person bathing (including washing, rinsing, drying)?: A Little Help from another person to put on and taking off regular upper body clothing?: A Little Help from another person to put on and taking off regular lower body clothing?: A Lot 6 Click Score: 18    End of Session Equipment Utilized During Treatment: Gait belt;Rolling walker (2 wheels)  OT Visit Diagnosis: Other abnormalities of gait and mobility (R26.89)   Activity Tolerance Patient tolerated treatment well   Patient Left in chair;with call bell/phone within reach   Nurse Communication Mobility status        Time: 1610-9604 OT Time Calculation (min): 21 min  Charges: OT General Charges $OT Visit: 1 Visit OT Treatments $Therapeutic Activity: 8-22 mins  [employer], OTS

## 2023-04-18 NOTE — Progress Notes (Signed)
Patient is not able to walk the distance required to go the bathroom, or he/she is unable to safely negotiate stairs required to access the bathroom.  A 3in1 BSC will alleviate this problem  

## 2023-04-18 NOTE — Progress Notes (Signed)
Patient transported to ultrasound with transport team. Alert and oriented x4. No acute distress. Arrived at room 250 after ultrasound. Bedside report given to nurse receiving patient

## 2023-04-18 NOTE — TOC Progression Note (Signed)
Transition of Care [facility]) - Progression Note    Patient Details  Name: Dawn Bruce MRN: [id number] Date of Birth: [birth year]  Transition of [facility] CM/SW Contact  Chapman Fitch, RN Phone Number: 04/18/2023, 3:55 PM  Clinical Narrative:     Per Irving Burton with [employer] life vest has been approved and patient will be fitted today at 530 pm  Kandee Keen with [facility] notified that patient will have lift vest  Referral made to Jon wit [employer] for [facility]  Expected Discharge Plan: Home w Home Health Services    Expected Discharge Plan and Services     Post Acute Care Choice: Home Health Living arrangements for the past 2 months: Single Family Home                           HH Arranged: PT, OT, RN [facility] Agency: [facility] Date [facility] Agency Contacted: 04/17/23   Representative spoke with at [facility] Agency: Kandee Keen   Social Determinants of Health (SDOH) Interventions SDOH Screenings   Food Insecurity: No Food Insecurity (04/13/2023)  Housing: Low Risk  (04/13/2023)  Transportation Needs: No Transportation Needs (04/13/2023)  Utilities: Not At Risk (04/13/2023)  Alcohol Screen: Low Risk  (12/08/2022)  Depression (PHQ2-9): Low Risk  (12/08/2022)  Financial Resource Strain: Low Risk  (10/13/2022)  Physical Activity: Insufficiently Active (10/13/2022)  Social Connections: Unknown (10/13/2022)  Stress: No Stress Concern Present (10/13/2022)  Tobacco Use: Medium Risk (04/13/2023)    Readmission Risk Interventions     No data to display

## 2023-04-18 NOTE — Progress Notes (Signed)
[facility] CLINIC CARDIOLOGY PROGRESS NOTE       Patient ID: Dawn Bruce MRN: [id number] DOB/AGE: June 01, 1945 77 y.o.  Admit date: 04/12/2023 Referring Physician Dr. Sunnie Nielsen Primary Physician Bacigalupo, Marzella Schlein, MD  Primary Cardiologist Dr. Gilman Buttner ([facility]) Reason for Consultation NSTEMI  HPI: Dawn Bruce is a 77 y.o. female  with a past medical history of chronic systolic heart failure with recovered EF (previously EF 35-40% improved to > 55%), persistent atrial fibrillation on Eliquis s/p catheter ablation (03/2021), HTN, HLD, previous tobacco use, recurrent pleural effusions (L>R) previously requiring thoracentesis, skin cancer, carotid disease s/p R CEA, CKD stage 3 who presented to the ED on 04/12/2023 for chest pain. Cardiology was consulted for further evaluation.   Interval history: -Patient resting in bed this AM, states she did not sleep well.  -She is without chest pain, SOB, palpitations. BP elevated this AM. Appears euvolemic on exam.  -Reports R groin pain is stable. Wound vac in place.   Review of systems complete and found to be negative unless listed above    Past Medical History:  Diagnosis Date   Actinic keratosis    Chronic pain    GERD (gastroesophageal reflux disease) ?   Taking Pepcid   History of basal cell carcinoma (BCC) 05/02/2021   right upper back paraspinal   History of SCC (squamous cell carcinoma) of skin 09/10/2019   left dorsum wrist ED&C done 10/28/19   History of SCC (squamous cell carcinoma) of skin 03/04/2020   right dorsum hand   History of squamous cell carcinoma in situ (SCCIS) 03/04/2020   left dorsum wrist  ED&C   History of squamous cell carcinoma in situ (SCCIS) 03/04/2020   right medial infraorbital  ED&C 04/28/2020   History of squamous cell carcinoma in situ (SCCIS) 10/28/2019   right dorsum hand proximal lateral   History of squamous cell carcinoma in situ (SCCIS) 10/28/2019   right dorsum hand proximal medial    Hyperlipidemia    Hypertension    NSTEMI (non-ST elevated myocardial infarction) (HCC) 04/12/2023   Renal disorder    Squamous cell carcinoma in situ 10/31/2021   left distal  tricep, EDC   Squamous cell carcinoma of skin 08/28/2022   Right volar forearm - EDC    Past Surgical History:  Procedure Laterality Date   ABDOMINAL HYSTERECTOMY     APPENDECTOMY  April 2023   Burst appendix   AUGMENTATION MAMMAPLASTY Bilateral    25-30 years ago   CARDIOVERSION N/A 12/01/2020   Procedure: CARDIOVERSION;  Surgeon: Debbe Odea, MD;  Location: [facility] ORS;  Service: Cardiovascular;  Laterality: N/A;   CAROTID ARTERY ANGIOPLASTY Right 1995(approximate)   CAROTID ENDARTERECTOMY  2001   CORONARY STENT INTERVENTION N/A 04/13/2023   Procedure: CORONARY STENT INTERVENTION;  Surgeon: Marcina Millard, MD;  Location: [facility] INVASIVE CV LAB;  Service: Cardiovascular;  Laterality: N/A;   COSMETIC SURGERY     ELBOW SURGERY Left    ENDARTERECTOMY FEMORAL Right 04/13/2023   Procedure: Right groin exploration with repair of right external iliac artery and right circumflex artery;  Surgeon: Bertram Denver, MD;  Location: [facility] ORS;  Service: Vascular;  Laterality: Right;   FRACTURE SURGERY  2016   Arm   LEFT HEART CATH AND CORONARY ANGIOGRAPHY N/A 04/13/2023   Procedure: LEFT HEART CATH AND CORONARY ANGIOGRAPHY;  Surgeon: Marcina Millard, MD;  Location: [facility] INVASIVE CV LAB;  Service: Cardiovascular;  Laterality: N/A;   THORACENTESIS N/A 12/27/2020   Procedure: Alanson Puls;  Surgeon: Audie Box  L, DO;  Location: [facility] ENDOSCOPY;  Service: Pulmonary;  Laterality: N/A;   TONSILLECTOMY     TUBAL LIGATION  ?    Medications Prior to Admission  Medication Sig Dispense Refill Last Dose   apixaban (ELIQUIS) 2.5 MG TABS tablet Take 2.5 mg by mouth 2 (two) times daily.   04/12/2023   atorvastatin (LIPITOR) 80 MG tablet Take 80 mg by mouth daily.   04/12/2023   carvedilol (COREG) 6.25 MG tablet Take 1  tablet (6.25 mg total) by mouth 2 (two) times daily. (Patient taking differently: Take 12.5 mg by mouth 2 (two) times daily.) 180 tablet 3 04/12/2023   clopidogrel (PLAVIX) 75 MG tablet Take 1 tablet (75 mg total) by mouth daily. 90 tablet 1 04/12/2023   ezetimibe (ZETIA) 10 MG tablet TAKE 1 TABLET(10 MG) BY MOUTH DAILY 90 tablet 0 04/12/2023   famotidine (PEPCID) 40 MG tablet Take 1 tablet (40 mg total) by mouth daily. (Patient taking differently: Take 40 mg by mouth daily as needed for indigestion or heartburn.) 90 tablet 0 unk   fentaNYL (DURAGESIC) 25 MCG/HR Place 2 patches onto the skin every 3 (three) days. 20 patch 0 Past Week   pregabalin (LYRICA) 150 MG capsule Take 1 capsule (150 mg total) by mouth 2 (two) times daily. 180 capsule 1 04/12/2023   traZODone (DESYREL) 50 MG tablet Take 0.5-1 tablets (25-50 mg total) by mouth at bedtime as needed for sleep. 30 tablet 3 unk   valsartan (DIOVAN) 160 MG tablet Take 160 mg by mouth in the morning and at bedtime.   04/12/2023   Social History   Socioeconomic History   Marital status: Married    Spouse name: Not on file   Number of children: 3   Years of education: Not on file   Highest education level: Some college, no degree  Occupational History   Occupation: retired  Tobacco Use   Smoking status: Former    Current packs/day: 0.00    Average packs/day: 0.3 packs/day for 15.0 years (3.8 ttl pk-yrs)    Types: Cigarettes    Start date: 18    Quit date: 1990    Years since quitting: 34.9    Passive exposure: Past   Smokeless tobacco: Never   Tobacco comments:    quit around 1990-1995 (?)  Vaping Use   Vaping status: Never Used  Substance and Sexual Activity   Alcohol use: Yes    Alcohol/week: 14.0 standard drinks of alcohol    Types: 14 Standard drinks or equivalent per week    Comment: 2 scotch shots   Drug use: Never   Sexual activity: Not Currently    Birth control/protection: None  Other Topics Concern   Not on file   Social History Narrative   Not on file   Social Determinants of Health   Financial Resource Strain: Low Risk  (10/13/2022)   Overall Financial Resource Strain (CARDIA)    Difficulty of Paying Living Expenses: Not hard at all  Food Insecurity: No Food Insecurity (04/13/2023)   Hunger Vital Sign    Worried About Running Out of Food in the Last Year: Never true    Ran Out of Food in the Last Year: Never true  Transportation Needs: No Transportation Needs (04/13/2023)   PRAPARE - Administrator, Civil Service (Medical): No    Lack of Transportation (Non-Medical): No  Physical Activity: Insufficiently Active (10/13/2022)   Exercise Vital Sign    Days of Exercise per Week: 5  days    Minutes of Exercise per Session: 20 min  Stress: No Stress Concern Present (10/13/2022)   [employer] of Occupational Health - Occupational Stress Questionnaire    Feeling of Stress : Not at all  Social Connections: Unknown (10/13/2022)   Social Connection and Isolation Panel [NHANES]    Frequency of Communication with Friends and Family: More than three times a week    Frequency of Social Gatherings with Friends and Family: Once a week    Attends Religious Services: Not on file    Active Member of Clubs or Organizations: No    Attends Banker Meetings: Never    Marital Status: Married  Catering manager Violence: Not At Risk (04/13/2023)   Humiliation, Afraid, Rape, and Kick questionnaire    Fear of Current or Ex-Partner: No    Emotionally Abused: No    Physically Abused: No    Sexually Abused: No    Family History  Problem Relation Age of Onset   Cancer Mother    Cancer Father    Cancer Sister    Breast cancer Neg Hx      Vitals:   04/18/23 0600 04/18/23 0700 04/18/23 0709 04/18/23 0825  BP: (!) 176/73 (!) 181/81 (!) 181/87 (!) 175/80  Pulse: 92 92 90   Resp: 16 13 13    Temp:      TempSrc:      SpO2: 97% 98% 97%   Weight:      Height:        PHYSICAL  EXAM General: Well appearing female, well nourished, in no acute laying at an slight incline in hospital bed. HEENT: Normocephalic and atraumatic. Neck: No JVD.  Lungs: Normal respiratory effort on room air. Clear bilaterally to auscultation. No wheezes, crackles, rhonchi.  Heart: HRRR. Normal S1 and S2 without gallops or murmurs.  Abdomen: Non-distended appearing.  Msk: Normal strength and tone for age. Extremities: Warm and well perfused. No clubbing, cyanosis. RUE with edema, improving. No LE edema.  Neuro: Alert and oriented X 3. Psych: Answers questions appropriately.   Labs: Basic Metabolic Panel: Recent Labs    04/17/23 0443 04/18/23 0320  NA 139 137  137  K 3.3* 3.7  3.7  CL 104 105  104  CO2 28 24  25   GLUCOSE 99 128*  134*  BUN 38* 32*  32*  CREATININE 2.08* 1.70*  1.62*  CALCIUM 7.7* 8.3*  8.0*  MG 2.1 2.3  PHOS 3.2 2.2*   Liver Function Tests: Recent Labs    04/16/23 0356 04/17/23 0443 04/18/23 0320  AST 530*  --  484*  ALT 325*  --  461*  ALKPHOS 47  --  75  BILITOT 1.5*  --  2.5*  PROT 4.8*  --  5.7*  ALBUMIN 2.8* 2.5* 2.9*  2.9*   No results for input(s): "LIPASE", "AMYLASE" in the last 72 hours. CBC: Recent Labs    04/16/23 0151 04/16/23 0356 04/16/23 1446 04/17/23 0443 04/18/23 0320  WBC 10.2 10.8*  --  9.8 14.0*  NEUTROABS 8.4* 9.0*  --   --   --   HGB 9.2* 9.1*   < > 8.5* 10.2*  HCT 26.8* 26.7*   < > 24.9* 30.5*  MCV 83.5 83.4  --  83.0 85.0  PLT 65* 65*  --  75* 136*   < > = values in this interval not displayed.   Cardiac Enzymes: No results for input(s): "CKTOTAL", "CKMB", "CKMBINDEX", "TROPONINIHS" in the last 72 hours.  BNP: No results for input(s): "BNP" in the last 72 hours.  D-Dimer: No results for input(s): "DDIMER" in the last 72 hours.  Hemoglobin A1C: No results for input(s): "HGBA1C" in the last 72 hours. Fasting Lipid Panel: No results for input(s): "CHOL", "HDL", "LDLCALC", "TRIG", "CHOLHDL", "LDLDIRECT"  in the last 72 hours.  Thyroid Function Tests: No results for input(s): "TSH", "T4TOTAL", "T3FREE", "THYROIDAB" in the last 72 hours.  Invalid input(s): "FREET3"  Anemia Panel: No results for input(s): "VITAMINB12", "FOLATE", "FERRITIN", "TIBC", "IRON", "RETICCTPCT" in the last 72 hours.   Radiology: US Venous Img Upper Uni Right(DVT)  Result Date: 04/16/2023 CLINICAL DATA:  Right upper extremity swelling for 3 days EXAM: RIGHT UPPER EXTREMITY VENOUS DOPPLER ULTRASOUND TECHNIQUE: Gray-scale sonography with graded compression, as well as color Doppler and duplex ultrasound were performed to evaluate the upper extremity deep venous system from the level of the subclavian vein and including the jugular, axillary, basilic, radial, ulnar and upper cephalic vein. Spectral Doppler was utilized to evaluate flow at rest and with distal augmentation maneuvers. COMPARISON:  None Available. FINDINGS: Contralateral Subclavian Vein: Respiratory phasicity is normal and symmetric with the symptomatic side. No evidence of thrombus. Normal compressibility. Internal Jugular Vein: No evidence of thrombus. Normal compressibility, respiratory phasicity and response to augmentation. Subclavian Vein: No evidence of thrombus. Normal compressibility, respiratory phasicity and response to augmentation. Axillary Vein: No evidence of thrombus. Normal compressibility, respiratory phasicity and response to augmentation. Cephalic Vein: Occlusive superficial thrombophlebitis of the mid to distal right cephalic vein. Proximal right cephalic vein is patent and compressible and without thrombus. Basilic Vein: No evidence of thrombus. Normal compressibility, respiratory phasicity and response to augmentation. Brachial Veins: No evidence of thrombus. Normal compressibility, respiratory phasicity and response to augmentation. Radial Veins: No evidence of thrombus. Normal compressibility, respiratory phasicity and response to augmentation.  Ulnar Veins: No evidence of thrombus. Normal compressibility, respiratory phasicity and response to augmentation. Venous Reflux:  None visualized. Other Findings: Right internal jugular central venous catheter seen within the right internal jugular vein without surrounding thrombus. IMPRESSION: 1. Occlusive superficial thrombophlebitis of the mid to distal right cephalic vein. 2. No evidence of deep venous thrombosis in the right upper extremity. Electronically Signed   By: Delbert Phenix M.D.   On: 04/16/2023 13:47   DG Chest Port 1 View  Result Date: 04/16/2023 CLINICAL DATA:  77 year old female with history of pleural effusions. EXAM: PORTABLE CHEST 1 VIEW COMPARISON:  Chest x-ray 04/15/2023. FINDINGS: Patient has been extubated. Nasogastric tube seen on the prior study has been removed. Right internal jugular central venous catheter with tip terminating at the superior cavoatrial junction. Opacity at the left base which may reflect atelectasis and/or consolidation. Small left pleural effusion. Right lung appears clear. No definite right pleural effusion. No pneumothorax. No evidence of pulmonary edema. Heart size is normal. Upper mediastinal contours are within normal limits. IMPRESSION: 1. Support apparatus, as above. 2. Atelectasis and/or consolidation in the left lung base with small left pleural effusion. Electronically Signed   By: Trudie Reed M.D.   On: 04/16/2023 06:56   US RENAL  Result Date: 04/15/2023 CLINICAL DATA:  Acute renal failure. EXAM: RENAL / URINARY TRACT ULTRASOUND COMPLETE COMPARISON:  CT abdomen pelvis dated April 13, 2023. FINDINGS: Right Kidney: Renal measurements: 7.5 x 4.3 x 4.1 cm = volume: 69 mL. Echogenicity within normal limits. No mass or hydronephrosis visualized. Left Kidney: Renal measurements: 7.7 x 4.2 x 3.6 cm = volume: 61 mL. Echogenicity within normal limits. No  mass or hydronephrosis visualized. Bladder: Decompressed by Foley catheter. Other: Bilateral  pleural effusions. Small amount of fluid in the upper abdomen. IMPRESSION: 1. No acute abnormality.  No hydronephrosis. Electronically Signed   By: Obie Dredge M.D.   On: 04/15/2023 18:02   DG Abd 1 View  Result Date: 04/15/2023 CLINICAL DATA:  Right retroperitoneal hematoma. EXAM: ABDOMEN - 1 VIEW COMPARISON:  04/14/2023 FINDINGS: No gaseous bowel dilatation. Foley catheter noted in the urinary bladder. Persistent contrast retention in the kidneys. Skin staples overlie the right groin region. IMPRESSION: 1. Nonobstructive bowel gas pattern. 2. Persistent contrast retention in the kidneys. Electronically Signed   By: Kennith Center M.D.   On: 04/15/2023 14:11   DG Chest Port 1 View  Result Date: 04/15/2023 CLINICAL DATA:  Endotracheal tube positioning/placement. EXAM: PORTABLE CHEST 1 VIEW COMPARISON:  Chest radiograph dated 04/14/2023. FINDINGS: The heart size and mediastinal contours are within normal limits. There is mild bibasilar atelectasis/airspace disease. No pneumothorax. An endotracheal tube terminates in the midthoracic trachea. An enteric tube enters the stomach and terminates below the field of view. IMPRESSION: Mild bibasilar atelectasis/airspace disease. Electronically Signed   By: Romona Curls M.D.   On: 04/15/2023 13:56   ECHOCARDIOGRAM COMPLETE BUBBLE STUDY  Result Date: 04/14/2023    ECHOCARDIOGRAM REPORT   Patient Name:   Dawn Bruce Date of Exam: 04/14/2023 Medical Rec #:  [id number]   Height:       63.0 in Accession #:    [id number]  Weight:       128.0 lb Date of Birth:  Feb 14, 1946  BSA:          1.600 m Patient Age:    45 years    BP:           92/63 mmHg Patient Gender: F           HR:           86 bpm. Exam Location:  [facility] Procedure: 2D Echo, Cardiac Doppler, Color Doppler and Saline Contrast Bubble            Study Indications:     CHF (congestive heart failure) (HCC) [[id number]]  History:         Patient has prior history of Echocardiogram examinations. Risk                   Factors:Hypertension.  Sonographer:     Neysa Bonito Roar Referring Phys:  [id number] Dorian Pod Mishika Flippen Diagnosing Phys: Marcina Millard MD IMPRESSIONS  1. Left ventricular ejection fraction, by estimation, is 25 to 30%. The left ventricle has severely decreased function. The left ventricle demonstrates regional wall motion abnormalities (see scoring diagram/findings for description). Left ventricular diastolic parameters were normal.  2. Right ventricular systolic function is normal. The right ventricular size is normal.  3. The mitral valve is normal in structure. Mild mitral valve regurgitation. No evidence of mitral stenosis.  4. The aortic valve is normal in structure. Aortic valve regurgitation is not visualized. No aortic stenosis is present.  5. The inferior vena cava is normal in size with greater than 50% respiratory variability, suggesting right atrial pressure of 3 mmHg. FINDINGS  Left Ventricle: Left ventricular ejection fraction, by estimation, is 25 to 30%. The left ventricle has severely decreased function. The left ventricle demonstrates regional wall motion abnormalities. The left ventricular internal cavity size was normal  in size. There is no left ventricular hypertrophy. Left ventricular diastolic parameters were normal.  LV Wall Scoring: The apical lateral  segment, apical septal segment, and apex are akinetic. The apical anterior segment and apical inferior segment are hypokinetic. Right Ventricle: The right ventricular size is normal. No increase in right ventricular wall thickness. Right ventricular systolic function is normal. Left Atrium: Left atrial size was normal in size. Right Atrium: Right atrial size was normal in size. Pericardium: Trivial pericardial effusion is present. Mitral Valve: The mitral valve is normal in structure. Mild mitral valve regurgitation. No evidence of mitral valve stenosis. MV peak gradient, 5.5 mmHg. The mean mitral valve gradient is 3.0 mmHg. Tricuspid Valve: The  tricuspid valve is normal in structure. Tricuspid valve regurgitation is not demonstrated. No evidence of tricuspid stenosis. Aortic Valve: The aortic valve is normal in structure. Aortic valve regurgitation is not visualized. No aortic stenosis is present. Aortic valve mean gradient measures 3.0 mmHg. Aortic valve peak gradient measures 4.6 mmHg. Aortic valve area, by VTI measures 1.14 cm. Pulmonic Valve: The pulmonic valve was normal in structure. Pulmonic valve regurgitation is not visualized. No evidence of pulmonic stenosis. Aorta: The aortic root is normal in size and structure. Venous: The inferior vena cava is normal in size with greater than 50% respiratory variability, suggesting right atrial pressure of 3 mmHg. IAS/Shunts: No atrial level shunt detected by color flow Doppler. Agitated saline contrast was given intravenously to evaluate for intracardiac shunting. Additional Comments: There is a small pleural effusion in the left lateral region.  LEFT VENTRICLE PLAX 2D LVIDd:         3.90 cm     Diastology LVIDs:         3.40 cm     LV e' medial:    2.84 cm/s LV PW:         1.00 cm     LV E/e' medial:  28.2 LV IVS:        1.10 cm     LV e' lateral:   4.95 cm/s LVOT diam:     1.40 cm     LV E/e' lateral: 16.2 LV SV:         23 LV SV Index:   14 LVOT Area:     1.54 cm  LV Volumes (MOD) LV vol d, MOD A2C: 48.3 ml LV vol d, MOD A4C: 39.9 ml LV vol s, MOD A2C: 35.0 ml LV vol s, MOD A4C: 28.3 ml LV SV MOD A2C:     13.3 ml LV SV MOD A4C:     39.9 ml LV SV MOD BP:      12.4 ml RIGHT VENTRICLE RV Basal diam:  2.40 cm RV Mid diam:    2.20 cm RV S prime:     6.97 cm/s TAPSE (M-mode): 0.9 cm LEFT ATRIUM             Index        RIGHT ATRIUM          Index LA diam:        3.10 cm 1.94 cm/m   RA Area:     7.55 cm LA Vol (A2C):   39.3 ml 24.57 ml/m  RA Volume:   12.20 ml 7.63 ml/m LA Vol (A4C):   31.6 ml 19.76 ml/m LA Biplane Vol: 35.1 ml 21.94 ml/m  AORTIC VALVE                    PULMONIC VALVE AV Area (Vmax):     1.34 cm     PV Vmax:  0.85 m/s AV Area (Vmean):   1.26 cm     PV Peak grad:   2.9 mmHg AV Area (VTI):     1.14 cm     RVOT Peak grad: 2 mmHg AV Vmax:           107.00 cm/s AV Vmean:          74.200 cm/s AV VTI:            0.203 m AV Peak Grad:      4.6 mmHg AV Mean Grad:      3.0 mmHg LVOT Vmax:         93.40 cm/s LVOT Vmean:        60.600 cm/s LVOT VTI:          0.150 m LVOT/AV VTI ratio: 0.74  AORTA Ao Root diam: 1.90 cm Ao Asc diam:  2.30 cm MITRAL VALVE                TRICUSPID VALVE MV Area (PHT): 5.84 cm     TR Peak grad:   23.6 mmHg MV Area VTI:   0.95 cm     TR Vmax:        243.00 cm/s MV Peak grad:  5.5 mmHg MV Mean grad:  3.0 mmHg     SHUNTS MV Vmax:       1.17 m/s     Systemic VTI:  0.15 m MV Vmean:      80.4 cm/s    Systemic Diam: 1.40 cm MV Decel Time: 130 msec MV E velocity: 80.20 cm/s MV A velocity: 105.00 cm/s MV E/A ratio:  0.76 MV A Prime:    7.1 cm/s Marcina Millard MD Electronically signed by Marcina Millard MD Signature Date/Time: 04/14/2023/2:42:41 PM    Final    DG Abd 1 View  Result Date: 04/14/2023 CLINICAL DATA:  Orogastric tube placement EXAM: ABDOMEN - 1 VIEW COMPARISON:  None Available. FINDINGS: Orogastric tube tip seen within the proximal body of the stomach with the proximal side hole in the region of the gastroesophageal junction. Nonobstructive bowel gas pattern. No gross free intraperitoneal gas. No organomegaly. Retained contrast opacifies the renal cortices bilaterally related to CT arteriogram performed 04/13/2023. Degenerative changes noted within the lumbar spine. IMPRESSION: 1. Orogastric tube tip within the proximal body of the stomach. Advancement of the catheter by 5-10 cm may more optimally position the device. Electronically Signed   By: Helyn Numbers M.D.   On: 04/14/2023 02:28   DG Chest Port 1 View  Result Date: 04/14/2023 CLINICAL DATA:  Chest tube placement EXAM: PORTABLE CHEST 1 VIEW COMPARISON:  04/13/2023 FINDINGS: Endotracheal tube  is seen 2.2 cm above the carina. Nasogastric tube extends into the upper abdomen beyond margin of the examination. Right internal jugular central venous catheter is in place with its tip at the superior cavoatrial junction. Lung volumes are small with mild elevation of the right hemidiaphragm. Retrocardiac opacity persists in keeping with atelectasis or infiltrate within this region. Lungs are otherwise clear. No pneumothorax or pleural effusion. Cardiac size within normal limits. Pulmonary vascularity is normal. No acute bone abnormality. No chest tube identified within the visualized thorax. IMPRESSION: 1. Support tubes in appropriate position. 2. No chest tube identified within the visualized thorax. No pneumothorax. 3. Persistent retrocardiac atelectasis or infiltrate. Electronically Signed   By: Helyn Numbers M.D.   On: 04/14/2023 02:26   CARDIAC CATHETERIZATION  Result Date: 04/13/2023   Mid LAD lesion is 30% stenosed.  Ost LAD to Prox LAD lesion is 99% stenosed.   A drug-eluting stent was successfully placed using a STENT ONYX FRONTIER 3.5X12.   Post intervention, there is a 0% residual stenosis.   There is mild left ventricular systolic dysfunction.   The left ventricular ejection fraction is 45-50% by visual estimate. 1.  High-grade in-stent restenosis ostial LAD 2.  Successful PCI with 3.5 x 12 mm Onyx frontier DES 3.  Right groin hematoma Recommendations 1.  Dual antiplatelet therapy uninterrupted 1 year 2.  Abdominal CT to rule out retroperitoneal bleed 3.  Further recommendations pending abdominal CT results   CT Angio Abd/Pel w/ and/or w/o  Result Date: 04/13/2023 CLINICAL DATA:  Suspected retroperitoneal hemorrhage from cath lab EXAM: CTA ABDOMEN AND PELVIS WITHOUT AND WITH CONTRAST TECHNIQUE: Multidetector CT imaging of the abdomen and pelvis was performed using the standard protocol during bolus administration of intravenous contrast. Multiplanar reconstructed images and MIPs were  obtained and reviewed to evaluate the vascular anatomy. RADIATION DOSE REDUCTION: This exam was performed according to the departmental dose-optimization program which includes automated exposure control, adjustment of the mA and/or kV according to patient size and/or use of iterative reconstruction technique. CONTRAST:  80mL OMNIPAQUE IOHEXOL 350 MG/ML SOLN COMPARISON:  CT abdomen pelvis, 12/13/2021 FINDINGS: VASCULAR Large right-sided retroperitoneal hematoma arising in the vicinity of the right external iliac artery and extending into the right paracolic gutter, dimensions at least 18.6 x 7.7 x 6.2 cm (series 15, image 44, series 10, image 139). Brisk arterial extravasation arising in the vicinity of the right inferior epigastric artery origin and distal portion of the right external iliac artery (series 10, image 161, series 15, image 44). Normal contour and caliber of the abdominal aorta. No evidence of aortic aneurysm, dissection, or other acute aortic pathology. 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 Moderate mixed calcific atherosclerosis. Review of the MIP images confirms the above findings. NON-VASCULAR Lower Chest: Moderate left, small right pleural effusions and associated atelectasis or consolidation. Bilateral breast implants small hiatal hernia. Hepatobiliary: No solid liver abnormality is seen. No gallstones, gallbladder wall thickening, or biliary dilatation. Pancreas: Unremarkable. No pancreatic ductal dilatation or surrounding inflammatory changes. Spleen: Normal in size without significant abnormality. Adrenals/Urinary Tract: Adrenal glands are unremarkable. Extensive, lobulated bilateral renal cortical scarring. No calculi or hydronephrosis. Excreted contrast within the collecting systems and bladder following catheterization. Foley catheter in the bladder. Stomach/Bowel: Stomach is within normal limits.  Appendix appears normal. No evidence of bowel wall thickening, distention, or inflammatory changes. Sigmoid diverticula. Lymphatic: No enlarged abdominal or pelvic lymph nodes. Reproductive: No mass or other significant abnormality. Other: No abdominal wall hernia or abnormality. Small volume hemoperitoneum about the liver and in the pelvis (series 10, image 156, series 5, image 17). Musculoskeletal: No acute osseous findings. IMPRESSION: 1. Large right-sided retroperitoneal hematoma arising in the vicinity of the right external iliac artery and extending into the right paracolic gutter, dimensions at least 18.6 x 7.7 x 6.2 cm. 2. Brisk arterial extravasation arising in the vicinity of the right inferior epigastric artery origin and distal portion of the right external iliac artery. 3. Small volume hemoperitoneum about the liver and in the pelvis. 4. Moderate left, small right pleural effusions and associated atelectasis or consolidation. These results were called by telephone at the time of interpretation on 04/13/2023 at 9:25 pm to [facility] PARASCHOS , who verbally acknowledged these results. Aortic Atherosclerosis (ICD10-I70.0). Electronically Signed  By: Jearld Lesch M.D.   On: 04/13/2023 21:38   US THORACENTESIS ASP PLEURAL SPACE W/IMG GUIDE  Result Date: 04/13/2023 INDICATION: Patient with history of CAD s/p stenting, admitted with complaints of new chest pain and found to have large left pleural effusion. Request for diagnostic and therapeutic left thoracentesis. EXAM: ULTRASOUND GUIDED LEFT THORACENTESIS MEDICATIONS: 15 mL 1% lidocaine COMPLICATIONS: None immediate. PROCEDURE: An ultrasound guided thoracentesis was thoroughly discussed with the patient and questions answered. The benefits, risks, alternatives and complications were also discussed. The patient understands and wishes to proceed with the procedure. Written consent was obtained. Ultrasound was performed to localize and mark an  adequate pocket of fluid in the left chest. The area was then prepped and draped in the normal sterile fashion. 1% Lidocaine was used for local anesthesia. Under ultrasound guidance a 6 Fr Safe-T-Centesis catheter was introduced. Thoracentesis was performed. The catheter was removed and a dressing applied. FINDINGS: A total of approximately 650 mL of clear yellow fluid was removed. Samples were sent to the laboratory as requested by the clinical team. IMPRESSION: Successful ultrasound guided left thoracentesis yielding 650 mL of pleural fluid. Performed by Lynnette Caffey, PA-C Electronically Signed   By: Olive Bass M.D.   On: 04/13/2023 14:13   DG Chest Port 1 View  Result Date: 04/13/2023 CLINICAL DATA:  [id number] Pleural effusion on left [id number]. Status post thoracentesis. EXAM: PORTABLE CHEST 1 VIEW COMPARISON:  04/12/2023. FINDINGS: There is small left pleural effusion with probable associated compressive atelectatic changes in the left lung. There is interval improvement when compared to the prior exam, status post left thoracentesis. No left pneumothorax. Biapical pleural thickening/calcifications again seen. Bilateral lung fields are otherwise clear. There is subtle blunting of right lateral costophrenic angle, which may represent trace right pleural effusion. Normal cardio-mediastinal silhouette. No acute osseous abnormalities. The soft tissues are within normal limits. IMPRESSION: *Small left pleural effusion, improved since the prior study, status post left thoracentesis. No pneumothorax. Electronically Signed   By: Jules Schick M.D.   On: 04/13/2023 12:58   CT Angio Chest Pulmonary Embolism (PE) W or WO Contrast  Result Date: 04/12/2023 CLINICAL DATA:  Chest pain for 1 hour, hypertension EXAM: CT ANGIOGRAPHY CHEST WITH CONTRAST TECHNIQUE: Multidetector CT imaging of the chest was performed using the standard protocol during bolus administration of intravenous contrast. Multiplanar CT image  reconstructions and MIPs were obtained to evaluate the vascular anatomy. RADIATION DOSE REDUCTION: This exam was performed according to the departmental dose-optimization program which includes automated exposure control, adjustment of the mA and/or kV according to patient size and/or use of iterative reconstruction technique. CONTRAST:  60mL OMNIPAQUE IOHEXOL 350 MG/ML SOLN COMPARISON:  12/28/2020, 04/12/2023 FINDINGS: Cardiovascular: This is a technically adequate evaluation of the pulmonary vasculature. No filling defects or pulmonary emboli. The heart is unremarkable without pericardial effusion. Normal caliber of the thoracic aorta. Atherosclerosis of the aorta and coronary vasculature. Mediastinum/Nodes: No enlarged mediastinal, hilar, or axillary lymph nodes. Thyroid gland, trachea, and esophagus demonstrate no significant findings. Lungs/Pleura: Large left pleural effusion, volume estimated in excess of 1 L. dependent consolidation within the left lower lobe most consistent with atelectasis. There is a trace right pleural effusion. No airspace disease or pneumothorax. Bilateral bronchial wall thickening, greatest in the lower lobes. Upper Abdomen: No acute abnormality. Musculoskeletal: No acute or destructive bony abnormalities. Reconstructed images demonstrate no additional findings. Review of the MIP images confirms the above findings. IMPRESSION: 1. No evidence of pulmonary embolus. 2. Large left pleural  effusion, volume estimated in excess of 1 L. Dependent left lower lobe atelectasis. 3. Trace right pleural effusion. 4. Bilateral bronchial wall thickening, greatest in the lower lobes, which may reflect bronchitis or reactive airway disease. No evidence of pneumonia. 5. Aortic Atherosclerosis (ICD10-I70.0). Coronary artery atherosclerosis. Electronically Signed   By: Sharlet Salina M.D.   On: 04/12/2023 23:46   DG Chest 2 View  Result Date: 04/12/2023 CLINICAL DATA:  Chest pain.  Hypertension. EXAM:  CHEST - 2 VIEW COMPARISON:  10/06/2021 FINDINGS: Midline trachea. Mild cardiomegaly. Increase in small to moderate left pleural effusion. Biapical pleural thickening. No pneumothorax. No congestive failure. Worsened left lower lobe airspace disease. IMPRESSION: Increase in small to moderate left pleural effusion. Progressive adjacent left base airspace disease which could represent atelectasis or infection. Cardiomegaly without congestive failure. Electronically Signed   By: Jeronimo Greaves M.D.   On: 04/12/2023 16:58    ECHO as above  TELEMETRY reviewed by me 04/18/2023: sinus rhythm rate 90s  EKG reviewed by me: sinus rhythm rate 81 bpm  Data reviewed by me 04/18/2023: last 24h vitals tele labs imaging I/O hospitalist notes, nephrology note  Principal Problem:   Chest pain Active Problems:   CKD (chronic kidney disease) stage 3, GFR 30-59 ml/min (HCC)   Essential hypertension   Chronic diastolic heart failure (HCC)   NSTEMI (non-ST elevated myocardial infarction) (HCC)   Tobacco abuse    ASSESSMENT AND PLAN:  Dawn Bruce is a 77 y.o. female  with a past medical history of coronary artery disease s/p DES to LAD 02/2022, chronic systolic heart failure with recovered EF (previously EF 35-40% improved to > 55%), persistent atrial fibrillation on Eliquis s/p catheter ablation (03/2021), HTN, HLD, previous tobacco use, recurrent pleural effusions (L>R) previously requiring thoracentesis, skin cancer, carotid disease s/p R CEA, CKD stage 3 who presented to the ED on 04/12/2023 for chest pain. Cardiology was consulted for further evaluation.   # Retroperitoneal hematoma Patient developed hematoma following LHC and underwent surgical repair of external iliac overnight on 11/23.  -Hgb remains stable.  -Vascular surgery following, wound vac in place.   # NSTEMI # Coronary artery disease s/p DES to LAD 02/2022 # Ischemic cardiomyopathy # Chronic HFrEF, previously recovered now reduced to  25-30% Patient reports onset of chest pain yesterday morning while at home.  Blood pressure also elevated at that time.  Episode similar to priors when she required stenting.  Troponins trended 27  > 609 > 979 > 1954.  EKG with normal sinus rhythm, nonacute. Echo this admission with EF 25-30%, + WMA. LHC 11/22 with 99% ostial to prox LAD lesion s/p DES with 3.5 x 12 mm Onyx frontier.  -Continue aspirin 81 mg daily, Plavix 75 mg daily, atorvastatin 20 mg daily, Zetia 10 mg daily. Will DC aspirin in 2 weeks as below.  -Increase carvedilol to 6.25 mg twice daily and imdur to 60 mg daily. Continue hydralazine 25 mg twice daily.  -Will set patient up with [employer]. Rep from [employer]) working with Case Production designer, theatre/television/film.   # Paroxysmal atrial fibrillation Patient with hx of PAF on chronic eliquis 2.5 mg bid.  -Continue eliquis 2.5 mg twice daily.  -Will plan for triple therapy x2 weeks then discontinue aspirin.    # L pleural effusion Patient with hx of chronic recurrent L pleural effusion noted to be large on CT chest.  -S/p thoracentesis 11/22 with 650 mL removed. -Management per primary.  # Chronic kidney disease stage IIIa Cr 1.29 on  admission, up to 2.63 yesterday now 1.7 on AM labs today.  -Nephrology evaluated patient yesterday. -Continue to monitor closely.   This patient's plan of care was discussed and created with Dr. Juliann Pares and he is in agreement.  Signed: Gale Journey, PA-C  04/18/2023, 8:33 AM [facility]

## 2023-04-18 NOTE — Progress Notes (Signed)
Progress Note    04/18/2023 2:33 PM 5 Days Post-Op  Subjective:   Dawn Bruce is a 77 yo female who presented to [facility] department with NSTEMI, underwent Cardiac cath with PCI to LAD via right femoral artery- 6Fr sheath, Mynx closure- subsequent hypotension, CTA found to have retroperitoneal hematoma with active extravasation from distal external Iliac.  Patient underwent right groin exploration with repair of the right external iliac circumflex arteries.  Patient is now postop day 3 awake alert and oriented.  She was extubated over the weekend and is off all pressors today.   Upon exam this morning patient is resting comfortably in bed.  Right groin area with Prevena wound VAC in place and working well.  Right lower extremity remains swollen with +2 to +3 edema in her right groin area extending now to below her knee.  Patient endorses she has not been out of bed yet or worked with physical therapy.  Patient endorses she feels much better today. Patient's Eliquis which she takes for A-fib restarted.  No other complaints today vitals all remained stable.    Vitals:   04/18/23 1250 04/18/23 1300  BP:  133/67  Pulse: 88 94  Resp: 13 15  Temp:    SpO2: 96% 97%   Physical Exam: Cardiac:  Irregular with Atrial Fibrillation. Normal S1, S2. No murmurs.  Lungs:  Decreased breath sounds bilaterally.  No use of accessory muscles of respiration.  Incisions:  Right groin with Prevena Wound Vac in place and working well.  Extremities:  Bilateral lower extremities are warm to touch. Good Capillary refill. Right Hip/Thigh area with +2-3 edema and erythema noted. Doppler pulses DP/PT bilateral.   Abdomen:  Positive bowel sounds, soft, non tender and non distended.  Neurologic: Patient is alert and oriented to person place and time.  She answers all questions and responds to commands appropriately.    CBC    Component Value Date/Time   WBC 14.0 (H) 04/18/2023 0320   RBC 3.59 (L)  04/18/2023 0320   HGB 10.2 (L) 04/18/2023 0320   HGB 13.6 06/01/2022 0000   HCT 30.5 (L) 04/18/2023 0320   HCT 40.5 06/01/2022 0000   PLT 136 (L) 04/18/2023 0320   PLT 132 (L) 06/01/2022 0000   MCV 85.0 04/18/2023 0320   MCV 90 06/01/2022 0000   MCH 28.4 04/18/2023 0320   MCHC 33.4 04/18/2023 0320   RDW 17.3 (H) 04/18/2023 0320   RDW 12.8 06/01/2022 0000   LYMPHSABS 0.8 04/16/2023 0356   LYMPHSABS 0.7 08/30/2021 0917   MONOABS 0.8 04/16/2023 0356   EOSABS 0.1 04/16/2023 0356   EOSABS 0.0 08/30/2021 0917   BASOSABS 0.0 04/16/2023 0356   BASOSABS 0.0 08/30/2021 0917    BMET    Component Value Date/Time   NA 137 04/18/2023 0320   NA 137 04/18/2023 0320   NA 147 (H) 06/01/2022 0000   K 3.7 04/18/2023 0320   K 3.7 04/18/2023 0320   CL 105 04/18/2023 0320   CL 104 04/18/2023 0320   CO2 24 04/18/2023 0320   CO2 25 04/18/2023 0320   GLUCOSE 128 (H) 04/18/2023 0320   GLUCOSE 134 (H) 04/18/2023 0320   BUN 32 (H) 04/18/2023 0320   BUN 32 (H) 04/18/2023 0320   BUN 23 06/01/2022 0000   CREATININE 1.70 (H) 04/18/2023 0320   CREATININE 1.62 (H) 04/18/2023 0320   CALCIUM 8.3 (L) 04/18/2023 0320   CALCIUM 8.0 (L) 04/18/2023 0320   GFRNONAA 30 (L) 04/18/2023 0320  GFRNONAA 32 (L) 04/18/2023 0320   GFRAA 54 (L) 10/08/2019 1419    INR    Component Value Date/Time   INR 1.2 04/16/2023 0151     Intake/Output Summary (Last 24 hours) at 04/18/2023 1433 Last data filed at 04/18/2023 1200 Gross per 24 hour  Intake 440 ml  Output 1400 ml  Net -960 ml     Assessment/Plan:  77 y.o. female is s/p right groin exploration with repair of the right external iliac circumflex arteries  5 Days Post-Op   PLAN: Ordered right lower extremity Duplex Ultrasound to evaluate for DVT as patients leg below the knee appears to be more edematous today. Patient was off Eliquis Pre Cardiac Cath and Post operatively for days due to procedure and Surgery. If U/S positive for DVT patient already has  treatment in place, on Eliquis. May consider intervention only if patient unable to mobilize and has resting pain.   Vascular Surgery Okay with patient ambulating with assist.    Prevena wound VAC to stay in place 7 to 10 days.  Patient will follow-up post surgery in 3 weeks for staple removal from right groin.  DVT prophylaxis:  ASA 81 mg daily, Plavix 75 mg daily and eliquis 2.5 mg twice daily   [name] 04/18/2023 2:33 PM

## 2023-04-19 DIAGNOSIS — I214 Non-ST elevation (NSTEMI) myocardial infarction: Secondary | ICD-10-CM | POA: Diagnosis not present

## 2023-04-19 LAB — RENAL FUNCTION PANEL
Albumin: 2.5 g/dL — ABNORMAL LOW (ref 3.5–5.0)
Anion gap: 11 (ref 5–15)
BUN: 28 mg/dL — ABNORMAL HIGH (ref 8–23)
CO2: 26 mmol/L (ref 22–32)
Calcium: 8.2 mg/dL — ABNORMAL LOW (ref 8.9–10.3)
Chloride: 104 mmol/L (ref 98–111)
Creatinine, Ser: 1.18 mg/dL — ABNORMAL HIGH (ref 0.44–1.00)
GFR, Estimated: 47 mL/min — ABNORMAL LOW (ref >60)
Glucose, Bld: 103 mg/dL — ABNORMAL HIGH (ref 70–99)
Phosphorus: 2.5 mg/dL (ref 2.5–4.6)
Potassium: 3.5 mmol/L (ref 3.5–5.1)
Sodium: 141 mmol/L (ref 135–145)

## 2023-04-19 LAB — CBC
HCT: 27.3 % — ABNORMAL LOW (ref 36.0–46.0)
Hemoglobin: 9.1 g/dL — ABNORMAL LOW (ref 12.0–15.0)
MCH: 28.5 pg (ref 26.0–34.0)
MCHC: 33.3 g/dL (ref 30.0–36.0)
MCV: 85.6 fL (ref 80.0–100.0)
Platelets: 148 10*3/uL — ABNORMAL LOW (ref 150–400)
RBC: 3.19 MIL/uL — ABNORMAL LOW (ref 3.87–5.11)
RDW: 17.2 % — ABNORMAL HIGH (ref 11.5–15.5)
WBC: 12.4 10*3/uL — ABNORMAL HIGH (ref 4.0–10.5)
nRBC: 0 % (ref 0.0–0.2)

## 2023-04-19 MED ORDER — ROPINIROLE HCL 0.25 MG PO TABS
0.2500 mg | ORAL_TABLET | Freq: Two times a day (BID) | ORAL | Status: DC
  Administered 2023-04-19 – 2023-04-20 (×3): 0.25 mg via ORAL
  Filled 2023-04-19 (×4): qty 1

## 2023-04-19 MED ORDER — THIAMINE MONONITRATE 100 MG PO TABS
100.0000 mg | ORAL_TABLET | Freq: Every day | ORAL | Status: DC
  Administered 2023-04-19 – 2023-04-20 (×2): 100 mg via ORAL
  Filled 2023-04-19 (×2): qty 1

## 2023-04-19 MED ORDER — ASPIRIN 81 MG PO CHEW
81.0000 mg | CHEWABLE_TABLET | Freq: Every day | ORAL | Status: DC
  Administered 2023-04-19 – 2023-04-20 (×2): 81 mg via ORAL
  Filled 2023-04-19 (×2): qty 1

## 2023-04-19 MED ORDER — EZETIMIBE 10 MG PO TABS
10.0000 mg | ORAL_TABLET | Freq: Every day | ORAL | Status: DC
  Administered 2023-04-19 – 2023-04-20 (×2): 10 mg via ORAL
  Filled 2023-04-19 (×2): qty 1

## 2023-04-19 MED ORDER — CLOPIDOGREL BISULFATE 75 MG PO TABS
75.0000 mg | ORAL_TABLET | Freq: Once | ORAL | Status: AC
  Administered 2023-04-19: 75 mg via ORAL
  Filled 2023-04-19: qty 1

## 2023-04-19 MED ORDER — CLOPIDOGREL BISULFATE 75 MG PO TABS
75.0000 mg | ORAL_TABLET | Freq: Every day | ORAL | Status: DC
  Administered 2023-04-20: 75 mg via ORAL
  Filled 2023-04-19: qty 1

## 2023-04-19 NOTE — Plan of Care (Signed)
  Problem: Education: Goal: Understanding of cardiac disease, CV risk reduction, and recovery process will improve Outcome: Progressing Goal: Individualized Educational Video(s) Outcome: Progressing   Problem: Activity: Goal: Ability to tolerate increased activity will improve Outcome: Progressing   Problem: Cardiac: Goal: Ability to achieve and maintain adequate cardiovascular perfusion will improve Outcome: Progressing   Problem: Health Behavior/Discharge Planning: Goal: Ability to safely manage health-related needs after discharge will improve Outcome: Progressing   Problem: Education: Goal: Understanding of CV disease, CV risk reduction, and recovery process will improve Outcome: Progressing Goal: Individualized Educational Video(s) Outcome: Progressing   Problem: Activity: Goal: Ability to return to baseline activity level will improve Outcome: Progressing   Problem: Cardiovascular: Goal: Ability to achieve and maintain adequate cardiovascular perfusion will improve Outcome: Progressing Goal: Vascular access site(s) Level 0-1 will be maintained Outcome: Progressing   Problem: Education: Goal: Knowledge of General Education information will improve Description: Including pain rating scale, medication(s)/side effects and non-pharmacologic comfort measures Outcome: Progressing   Problem: Clinical Measurements: Goal: Ability to maintain clinical measurements within normal limits will improve Outcome: Progressing Goal: Will remain free from infection Outcome: Progressing Goal: Diagnostic test results will improve Outcome: Progressing Goal: Respiratory complications will improve Outcome: Progressing Goal: Cardiovascular complication will be avoided Outcome: Progressing   Problem: Safety: Goal: Ability to remain free from injury will improve Outcome: Progressing   Problem: Skin Integrity: Goal: Risk for impaired skin integrity will decrease Outcome:  Progressing   Problem: Pain Management: Goal: General experience of comfort will improve Outcome: Progressing   Problem: Elimination: Goal: Will not experience complications related to bowel motility Outcome: Progressing Goal: Will not experience complications related to urinary retention Outcome: Progressing   Problem: Coping: Goal: Level of anxiety will decrease Outcome: Progressing

## 2023-04-19 NOTE — Plan of Care (Signed)
Problem: Cardiac: Goal: Ability to achieve and maintain adequate cardiovascular perfusion will improve Outcome: Progressing

## 2023-04-19 NOTE — Progress Notes (Signed)
PROGRESS NOTE    Dawn Bruce  ZOX:[id number] DOB: Aug 31, 1945 DOA: 04/12/2023 PCP: Erasmo Downer, MD    Assessment & Plan:   Principal Problem:   Chest pain Active Problems:   NSTEMI (non-ST elevated myocardial infarction) (HCC)   CKD (chronic kidney disease) stage 3, GFR 30-59 ml/min (HCC)   Essential hypertension   Chronic diastolic heart failure (HCC)   Tobacco abuse  Assessment and Plan: Retroperitoneal hematoma & hemorrhagic shock: resolved. Complication of Iliac artery laceration during left heart cath. Underwent surgical repair of external iliac on 11/23. H&H are labile but no need for transfusion currently. Vascular surgery following. Will need to f/u outpatient f/u in 3 weeks to remove staples R groin. Wound vac in place for 7-10 days total as per vasc surg    NSTEMI:. LHC 11/22 with 99% ostial to prox LAD lesion s/p DES with 3.5 x 12 mm Onyx frontier.  Continue on aspirin, plavix, statin, zetia, coreg, imdur, hydralazine as per cardio. Hx of CAD & ischemic cardiomyopathy.   Chronic systolic CHF: EF 81-19%. Has a lifevest but needs more education as per pt. Continue on aspirin, statin, coreg, plavix, zetia, hydralazine, imdur as per cardio   RLS: started ropinirole   PAF: continue on eliquis, plavix & aspirin x 2 weeks & then d/c aspirin     Recurrent L pleural effusion: s/p thoracentesis 11/22 with 650 mL removed.   CKDIIIa: Cr is trending down from day prior. Nephro signed off 04/19/23   Chronic pain: continue w/ home fentanyl patch        DVT prophylaxis:  eliquis  Code Status: full  Family Communication: discussed pt's care w/ pt's family at bedside and answered their questions  Disposition Plan: likely d/c back home w/ HH   Level of care: Telemetry Cardiac Consultants:  Vasc surg Cardio  Nephro   Procedures:   Antimicrobials:    Subjective: Pt c/o restless legs   Objective: Vitals:   04/18/23 2103 04/19/23 0423 04/19/23 0605 04/19/23  0727  BP: (!) 137/55 (!) 166/69  (!) 155/70  Pulse: 87 99  97  Resp: 16 18 18 16   Temp: 98.4 F (36.9 C) 98.7 F (37.1 C)  98.9 F (37.2 C)  TempSrc:      SpO2: 97% 96%  97%  Weight:      Height:        Intake/Output Summary (Last 24 hours) at 04/19/2023 1115 Last data filed at 04/19/2023 1026 Gross per 24 hour  Intake 370 ml  Output 200 ml  Net 170 ml   Filed Weights   04/12/23 1518 04/13/23 1645 04/15/23 0400  Weight: 61.2 kg 58.1 kg 54.4 kg    Examination:  General exam: Appears uncomfortable   Respiratory system: clear breath sounds b/l  Cardiovascular system: S1/S2+. No rubs or clicks  Gastrointestinal system: abd is soft, NT, ND & hypoactive bowel sounds  Central nervous system: alert & oriented. Moves all extremities   Psychiatry: Judgement and insight appears normal. Flat mood and affect    Data Reviewed: I have personally reviewed following labs and imaging studies  CBC: Recent Labs  Lab 04/13/23 0415 04/14/23 0044 04/14/23 1621 04/15/23 0420 04/15/23 1409 04/16/23 0151 04/16/23 0356 04/16/23 1446 04/16/23 2113 04/17/23 0443 04/18/23 0320 04/19/23 0600  WBC 9.5   < > 16.9* 11.9*  --  10.2 10.8*  --   --  9.8 14.0* 12.4*  NEUTROABS 8.1*  --  14.3* 10.0*  --  8.4* 9.0*  --   --   --   --   --  HGB 14.3   < > 10.4* 9.5*   < > 9.2* 9.1* 9.0* 8.7* 8.5* 10.2* 9.1*  HCT 43.6   < > 29.1* 27.1*   < > 26.8* 26.7* 26.8* 25.4* 24.9* 30.5* 27.3*  MCV 86.9   < > 84.3 84.7  --  83.5 83.4  --   --  83.0 85.0 85.6  PLT 192   < > 92* 81*  --  65* 65*  --   --  75* 136* 148*   < > = values in this interval not displayed.   Basic Metabolic Panel: Recent Labs  Lab 04/15/23 0420 04/15/23 1223 04/16/23 0356 04/17/23 0443 04/18/23 0320 04/19/23 0600  NA 140  --  137 139 137  137 141  K 3.2* 3.5 3.7 3.3* 3.7  3.7 3.5  CL 104  --  102 104 105  104 104  CO2 26  --  25 28 24  25 26   GLUCOSE 112*  --  101* 99 128*  134* 103*  BUN 39*  --  43* 38* 32*   32* 28*  CREATININE 2.06*  --  2.63* 2.08* 1.70*  1.62* 1.18*  CALCIUM 6.9*  --  7.4* 7.7* 8.3*  8.0* 8.2*  MG 1.4* 2.0 2.1 2.1 2.3  --   PHOS 4.6  --  4.4 3.2 2.2* 2.5   GFR: Estimated Creatinine Clearance: 32 mL/min (A) (by C-G formula based on SCr of 1.18 mg/dL (H)). Liver Function Tests: Recent Labs  Lab 04/13/23 0415 04/14/23 0624 04/15/23 0420 04/16/23 0356 04/17/23 0443 04/18/23 0320 04/19/23 0600  AST 38 131* 909* 530*  --  484*  --   ALT 21 59* 477* 325*  --  461*  --   ALKPHOS 86 45 41 47  --  75  --   BILITOT 1.0 1.8* 1.0 1.5*  --  2.5*  --   PROT 6.9 4.0* 3.9* 4.8*  --  5.7*  --   ALBUMIN 3.6 2.5* 2.4* 2.8* 2.5* 2.9*  2.9* 2.5*   No results for input(s): "LIPASE", "AMYLASE" in the last 168 hours. No results for input(s): "AMMONIA" in the last 168 hours. Coagulation Profile: Recent Labs  Lab 04/13/23 0415 04/16/23 0151  INR 1.1 1.2   Cardiac Enzymes: No results for input(s): "CKTOTAL", "CKMB", "CKMBINDEX", "TROPONINI" in the last 168 hours. BNP (last 3 results) No results for input(s): "PROBNP" in the last 8760 hours. HbA1C: No results for input(s): "HGBA1C" in the last 72 hours. CBG: Recent Labs  Lab 04/13/23 2138  GLUCAP 130*   Lipid Profile: No results for input(s): "CHOL", "HDL", "LDLCALC", "TRIG", "CHOLHDL", "LDLDIRECT" in the last 72 hours. Thyroid Function Tests: No results for input(s): "TSH", "T4TOTAL", "FREET4", "T3FREE", "THYROIDAB" in the last 72 hours. Anemia Panel: No results for input(s): "VITAMINB12", "FOLATE", "FERRITIN", "TIBC", "IRON", "RETICCTPCT" in the last 72 hours. Sepsis Labs: Recent Labs  Lab 04/14/23 0124 04/14/23 0544 04/14/23 0839 04/14/23 1146  LATICACIDVEN 4.3* 6.1* 5.5* 2.6*    Recent Results (from the past 240 hour(s))  Body fluid culture w Gram Stain     Status: None   Collection Time: 04/13/23 11:47 AM   Specimen: PATH Cytology Pleural fluid  Result Value Ref Range Status   Specimen Description   Final     PLEURAL Performed at [facility], [address], [place], [place] [address]    Special Requests   Final    NONE Performed at [facility], [address], [place], [place]  [address]    Gram Stain   Final    WBC PRESENT, PREDOMINANTLY MONONUCLEAR NO ORGANISMS SEEN CYTOSPIN SMEAR    Culture   Final    NO GROWTH 3 DAYS Performed at [facility], [address] [place], [place] [address]    Report Status 04/16/2023 FINAL  Final  MRSA Next Gen by PCR, Nasal     Status: None   Collection Time: 04/13/23 11:00 PM   Specimen: Nasal Mucosa; Nasal Swab  Result Value Ref Range Status   MRSA by PCR Next Gen NOT DETECTED NOT DETECTED Final    Comment: (NOTE) The GeneXpert MRSA Assay (FDA approved for NASAL specimens only), is one component of a comprehensive MRSA colonization surveillance program. It is not intended to diagnose MRSA infection nor to guide or monitor treatment for MRSA infections. Test performance is not FDA approved in patients less than 66 years old. Performed at [facility], [address] [place], [place] [address]          Radiology Studies: US Venous Img Lower Unilateral Right (DVT)  Result Date: 04/18/2023 CLINICAL DATA:  Lower extremity edema EXAM: RIGHT LOWER EXTREMITY VENOUS DOPPLER ULTRASOUND TECHNIQUE: Gray-scale sonography with compression, as well as color and duplex ultrasound, were performed to evaluate the deep venous system(s) from the level of the common femoral vein through the popliteal and proximal calf veins. COMPARISON:  None Available. FINDINGS: VENOUS The common femoral vein and central profundus femoral vein is obscured by overlying medical device. Normal compressibility of the visualized femoral and popliteal veins, as well as the visualized calf veins. No filling defects to suggest DVT on grayscale or color Doppler imaging. Doppler waveforms show normal direction of venous flow, normal respiratory  plasticity and response to augmentation. Limited views of the contralateral common femoral vein are unremarkable. OTHER 4.4 cm fluid collection within the popliteal fossa noted in keeping with a Baker cyst. Limitations: none IMPRESSION: 1. Limited evaluation of the right common femoral and central profundus femoral veins. No evidence of femoropopliteal DVT or calf DVT within the visualized right lower extremity. 2. 4.4 cm Baker cyst. Electronically Signed   By: Helyn Numbers M.D.   On: 04/18/2023 20:55        Scheduled Meds:  sodium chloride   Intravenous Once   sodium chloride   Intravenous Once   sodium chloride   Intravenous Once   apixaban  2.5 mg Oral BID   aspirin  81 mg Oral Daily   atorvastatin  80 mg Oral Daily   carvedilol  6.25 mg Oral BID WC   Chlorhexidine Gluconate Cloth  6 each Topical Daily   [START ON 04/20/2023] clopidogrel  75 mg Oral Q breakfast   ezetimibe  10 mg Oral Daily   feeding supplement  237 mL Oral BID BM   fentaNYL  1 patch Transdermal Q72H   folic acid  1 mg Oral Daily   hydrALAZINE  25 mg Oral BID   isosorbide mononitrate  60 mg Oral Daily   multivitamin with minerals  1 tablet Oral Daily   ondansetron (ZOFRAN) IV  4 mg Intravenous Once   mouth rinse  15 mL Mouth Rinse 4 times per day   pantoprazole  40 mg Oral Daily   sodium chloride flush  10-40 mL Intracatheter Q12H   thiamine  100 mg Oral Daily   Continuous Infusions:   LOS: 7 days       Charise Killian, MD [facility] Pager [phone]  [phone]  If 7PM-7AM, please contact night-coverage [email or web address] 04/19/2023, 11:15 AM

## 2023-04-19 NOTE — Progress Notes (Signed)
Right internal jugular removed per order. Pressure held for 5 minutes. Vaseline gauze, gauze, and tegaderm applied. S/s of infection discussed with pt. Pt instructed to keep site dry and intact for 24 hours. Pt verbalized understanding.

## 2023-04-19 NOTE — Progress Notes (Signed)
[facility], [place] 04/19/23  Subjective:   Hospital day # 7   Renal: 11/27 0701 - 11/28 0700 In: 500 [P.O.:480; I.V.:20] Out: 400 [Urine:400] Lab Results  Component Value Date   CREATININE 1.18 (H) 04/19/2023   CREATININE 1.70 (H) 04/18/2023   CREATININE 1.62 (H) 04/18/2023   Patient states that she slept well last night. Able to eat without nausea or vomiting.  Husband at bedside. Serum creatinine has improved to 1.18 today and is close to baseline.  Objective:  Vital signs in last 24 hours:  Temp:  [98.4 F (36.9 C)-98.9 F (37.2 C)] 98.9 F (37.2 C) (11/28 0727) Pulse Rate:  [86-99] 97 (11/28 0727) Resp:  [12-18] 16 (11/28 0727) BP: (133-166)/(55-70) 155/70 (11/28 0727) SpO2:  [96 %-98 %] 97 % (11/28 0727)  Weight change:  Filed Weights   04/12/23 1518 04/13/23 1645 04/15/23 0400  Weight: 61.2 kg 58.1 kg 54.4 kg    Intake/Output:    Intake/Output Summary (Last 24 hours) at 04/19/2023 1220 Last data filed at 04/19/2023 1026 Gross per 24 hour  Intake 370 ml  Output 100 ml  Net 270 ml     Physical Exam: General: No acute distress, laying in the bed.  HEENT Moist oral mucous membranes  Pulm/lungs Normal breathing effort on room air  CVS/Heart Regular  Abdomen:  Soft, nontender  Extremities: Trace edema left leg edema  Neurologic: Alert, oriented  Skin: Warm, dry  Access:        Basic Metabolic Panel:  Recent Labs  Lab 04/15/23 0420 04/15/23 1223 04/16/23 0356 04/17/23 0443 04/18/23 0320 04/19/23 0600  NA 140  --  137 139 137  137 141  K 3.2* 3.5 3.7 3.3* 3.7  3.7 3.5  CL 104  --  102 104 105  104 104  CO2 26  --  25 28 24  25 26   GLUCOSE 112*  --  101* 99 128*  134* 103*  BUN 39*  --  43* 38* 32*  32* 28*  CREATININE 2.06*  --  2.63* 2.08* 1.70*  1.62* 1.18*  CALCIUM 6.9*  --  7.4* 7.7* 8.3*  8.0* 8.2*  MG 1.4* 2.0 2.1 2.1 2.3  --   PHOS 4.6  --  4.4 3.2 2.2* 2.5     CBC: Recent Labs  Lab  04/13/23 0415 04/14/23 0044 04/14/23 1621 04/15/23 0420 04/15/23 1409 04/16/23 0151 04/16/23 0356 04/16/23 1446 04/16/23 2113 04/17/23 0443 04/18/23 0320 04/19/23 0600  WBC 9.5   < > 16.9* 11.9*  --  10.2 10.8*  --   --  9.8 14.0* 12.4*  NEUTROABS 8.1*  --  14.3* 10.0*  --  8.4* 9.0*  --   --   --   --   --   HGB 14.3   < > 10.4* 9.5*   < > 9.2* 9.1* 9.0* 8.7* 8.5* 10.2* 9.1*  HCT 43.6   < > 29.1* 27.1*   < > 26.8* 26.7* 26.8* 25.4* 24.9* 30.5* 27.3*  MCV 86.9   < > 84.3 84.7  --  83.5 83.4  --   --  83.0 85.0 85.6  PLT 192   < > 92* 81*  --  65* 65*  --   --  75* 136* 148*   < > = values in this interval not displayed.     No results found for: "HEPBSAG", "HEPBSAB", "HEPBIGM"    Microbiology:  Recent Results (from the past 240 hour(s))  Body fluid  culture w Gram Stain     Status: None   Collection Time: 04/13/23 11:47 AM   Specimen: PATH Cytology Pleural fluid  Result Value Ref Range Status   Specimen Description   Final    PLEURAL Performed at [facility], [address], [place], [place] [address]    Special Requests   Final    NONE Performed at [facility], [address] [place], [place] [address]    Gram Stain   Final    WBC PRESENT, PREDOMINANTLY MONONUCLEAR NO ORGANISMS SEEN CYTOSPIN SMEAR    Culture   Final    NO GROWTH 3 DAYS Performed at [facility], [address] [place], [place] [address]    Report Status 04/16/2023 FINAL  Final  MRSA Next Gen by PCR, Nasal     Status: None   Collection Time: 04/13/23 11:00 PM   Specimen: Nasal Mucosa; Nasal Swab  Result Value Ref Range Status   MRSA by PCR Next Gen NOT DETECTED NOT DETECTED Final    Comment: (NOTE) The GeneXpert MRSA Assay (FDA approved for NASAL specimens only), is one component of a comprehensive MRSA colonization surveillance program. It is not intended to diagnose MRSA infection nor to guide or monitor treatment for MRSA infections. Test performance is not  FDA approved in patients less than 80 years old. Performed at [facility], [address] [place], [place] [address]     Coagulation Studies: No results for input(s): "LABPROT", "INR" in the last 72 hours.   Urinalysis: Recent Labs    04/17/23 0443  COLORURINE YELLOW*  LABSPEC 1.012  PHURINE 6.0  GLUCOSEU NEGATIVE  HGBUR SMALL*  BILIRUBINUR NEGATIVE  KETONESUR NEGATIVE  PROTEINUR NEGATIVE  NITRITE NEGATIVE  LEUKOCYTESUR TRACE*      Imaging: US Venous Img Lower Unilateral Right (DVT)  Result Date: 04/18/2023 CLINICAL DATA:  Lower extremity edema EXAM: RIGHT LOWER EXTREMITY VENOUS DOPPLER ULTRASOUND TECHNIQUE: Gray-scale sonography with compression, as well as color and duplex ultrasound, were performed to evaluate the deep venous system(s) from the level of the common femoral vein through the popliteal and proximal calf veins. COMPARISON:  None Available. FINDINGS: VENOUS The common femoral vein and central profundus femoral vein is obscured by overlying medical device. Normal compressibility of the visualized femoral and popliteal veins, as well as the visualized calf veins. No filling defects to suggest DVT on grayscale or color Doppler imaging. Doppler waveforms show normal direction of venous flow, normal respiratory plasticity and response to augmentation. Limited views of the contralateral common femoral vein are unremarkable. OTHER 4.4 cm fluid collection within the popliteal fossa noted in keeping with a Baker cyst. Limitations: none IMPRESSION: 1. Limited evaluation of the right common femoral and central profundus femoral veins. No evidence of femoropopliteal DVT or calf DVT within the visualized right lower extremity. 2. 4.4 cm Baker cyst. Electronically Signed   By: Helyn Numbers M.D.   On: 04/18/2023 20:55     Medications:     apixaban  2.5 mg Oral BID   aspirin  81 mg Oral Daily   atorvastatin  80 mg Oral Daily   carvedilol  6.25 mg Oral BID WC    Chlorhexidine Gluconate Cloth  6 each Topical Daily   [START ON 04/20/2023] clopidogrel  75 mg Oral Q breakfast   ezetimibe  10 mg Oral Daily   feeding supplement  237 mL Oral BID BM   fentaNYL  1 patch Transdermal Q72H   folic acid  1 mg Oral Daily   hydrALAZINE  25 mg Oral BID   isosorbide mononitrate  60 mg Oral Daily   multivitamin with minerals  1 tablet Oral Daily   ondansetron (ZOFRAN) IV  4 mg Intravenous Once   mouth rinse  15 mL Mouth Rinse 4 times per day   pantoprazole  40 mg Oral Daily   rOPINIRole  0.25 mg Oral BID   sodium chloride flush  10-40 mL Intracatheter Q12H   thiamine  100 mg Oral Daily   acetaminophen, fentaNYL (SUBLIMAZE) injection, hydrALAZINE, HYDROcodone-acetaminophen, nitroGLYCERIN, ondansetron (ZOFRAN) IV, mouth rinse, polyvinyl alcohol, sodium chloride flush  Assessment/ Plan:  77 y.o. female with  medical problems of  hypertension, heart failure with preserved ejection fraction, history of pleural effusions, history of diffuse vascular disease including coronary artery disease-history of PCI in LAD stent October 2023, chronic kidney disease, prediabetes, atrial fibrillation status post cardioversion in 2022   admitted on 04/12/2023 for Status post thoracentesis [Z98.890] NSTEMI (non-ST elevated myocardial infarction) (HCC) [I21.4] Hypertensive emergency [I16.1] Chest pain [R07.9]  1.  Acute kidney injury on chronic kidney disease stage IIIa Baseline creatinine of 1.2/GFR 44 from 04/13/2023 Creatinine appears to have peaked and has started to improve.  Today's creatinine is down to 1.2/GFR 47 (back to baseline) Acute kidney injury most likely secondary to ATN due hypotension from complications of cardiac catheterization and IV contrast exposure on April 13, 2023. Renal ultrasound-04/15/2023-no acute abnormality. Urinalysis-04/17/2023-trace leukocytes, 0 RBCs, negative for protein.  Serum creatinine is back to baseline.  We will sign off. Please  reconsult as necessary.     LOS: 7 Adriana Lina 11/28/202412:20 PM  [facility] [place], [place] [phone]  Note: This note was prepared with Dragon dictation. Any transcription errors are unintentional

## 2023-04-19 NOTE — Progress Notes (Signed)
[facility] Cardiology    SUBJECTIVE: Patient feels much better since initial event with non-STEMI PCI and stent LAD DES subsequent retroperitoneal bleed complication requiring surgical intervention by vascular patient now appears to be doing reasonably well but has had history of paroxysmal atrial fibrillation on Eliquis which is to be continued patient will be on triple therapy aspirin and Plavix as well she has had pleural effusion with shortness of breath is improved renal insufficiency followed by nephrology with now improving renal function   Vitals:   04/18/23 2103 04/19/23 0423 04/19/23 0605 04/19/23 0727  BP: (!) 137/55 (!) 166/69  (!) 155/70  Pulse: 87 99  97  Resp: 16 18 18 16   Temp: 98.4 F (36.9 C) 98.7 F (37.1 C)  98.9 F (37.2 C)  TempSrc:      SpO2: 97% 96%  97%  Weight:      Height:         Intake/Output Summary (Last 24 hours) at 04/19/2023 0855 Last data filed at 04/19/2023 0600 Gross per 24 hour  Intake 500 ml  Output 400 ml  Net 100 ml      PHYSICAL EXAM  General: Well developed, well nourished, in no acute distress HEENT:  Normocephalic and atramatic Neck:  No JVD.  Lungs: Clear bilaterally to auscultation and percussion. Heart: HRRR . Normal S1 and S2 without gallops or murmurs.  Abdomen: Bowel sounds are positive, abdomen soft and non-tender  Msk:  Back normal, normal gait. Normal strength and tone for age. Extremities: No clubbing, cyanosis or edema.   Neuro: Alert and oriented X 3. Psych:  Good affect, responds appropriately   LABS: Basic Metabolic Panel: Recent Labs    04/17/23 0443 04/18/23 0320 04/19/23 0600  NA 139 137  137 141  K 3.3* 3.7  3.7 3.5  CL 104 105  104 104  CO2 28 24  25 26   GLUCOSE 99 128*  134* 103*  BUN 38* 32*  32* 28*  CREATININE 2.08* 1.70*  1.62* 1.18*  CALCIUM 7.7* 8.3*  8.0* 8.2*  MG 2.1 2.3  --   PHOS 3.2 2.2* 2.5   Liver Function Tests: Recent Labs    04/18/23 0320 04/19/23 0600  AST 484*  --    ALT 461*  --   ALKPHOS 75  --   BILITOT 2.5*  --   PROT 5.7*  --   ALBUMIN 2.9*  2.9* 2.5*   No results for input(s): "LIPASE", "AMYLASE" in the last 72 hours. CBC: Recent Labs    04/18/23 0320 04/19/23 0600  WBC 14.0* 12.4*  HGB 10.2* 9.1*  HCT 30.5* 27.3*  MCV 85.0 85.6  PLT 136* 148*   Cardiac Enzymes: No results for input(s): "CKTOTAL", "CKMB", "CKMBINDEX", "TROPONINI" in the last 72 hours. BNP: Invalid input(s): "POCBNP" D-Dimer: No results for input(s): "DDIMER" in the last 72 hours. Hemoglobin A1C: No results for input(s): "HGBA1C" in the last 72 hours. Fasting Lipid Panel: No results for input(s): "CHOL", "HDL", "LDLCALC", "TRIG", "CHOLHDL", "LDLDIRECT" in the last 72 hours. Thyroid Function Tests: No results for input(s): "TSH", "T4TOTAL", "T3FREE", "THYROIDAB" in the last 72 hours.  Invalid input(s): "FREET3" Anemia Panel: No results for input(s): "VITAMINB12", "FOLATE", "FERRITIN", "TIBC", "IRON", "RETICCTPCT" in the last 72 hours.  US Venous Img Lower Unilateral Right (DVT)  Result Date: 04/18/2023 CLINICAL DATA:  Lower extremity edema EXAM: RIGHT LOWER EXTREMITY VENOUS DOPPLER ULTRASOUND TECHNIQUE: Gray-scale sonography with compression, as well as color and duplex ultrasound, were performed to evaluate the deep  venous system(s) from the level of the common femoral vein through the popliteal and proximal calf veins. COMPARISON:  None Available. FINDINGS: VENOUS The common femoral vein and central profundus femoral vein is obscured by overlying medical device. Normal compressibility of the visualized femoral and popliteal veins, as well as the visualized calf veins. No filling defects to suggest DVT on grayscale or color Doppler imaging. Doppler waveforms show normal direction of venous flow, normal respiratory plasticity and response to augmentation. Limited views of the contralateral common femoral vein are unremarkable. OTHER 4.4 cm fluid collection within the  popliteal fossa noted in keeping with a Baker cyst. Limitations: none IMPRESSION: 1. Limited evaluation of the right common femoral and central profundus femoral veins. No evidence of femoropopliteal DVT or calf DVT within the visualized right lower extremity. 2. 4.4 cm Baker cyst. Electronically Signed   By: Helyn Numbers M.D.   On: 04/18/2023 20:55     Echo moderate severely depressed left ventricular function EF 25 to 30%  TELEMETRY: Atrial fibrillation rate somewhat improved at around 80:  ASSESSMENT AND PLAN:  Principal Problem:   Chest pain Active Problems:   CKD (chronic kidney disease) stage 3, GFR 30-59 ml/min (HCC)   Essential hypertension   Chronic diastolic heart failure (HCC)   NSTEMI (non-ST elevated myocardial infarction) (HCC)   Tobacco abuse    Plan Non-STEMI status post PCI and stent to proximal LAD DES on triple therapy aspirin Plavix Eliquis to be started once stable Status post retroperitoneal bleed requiring vascular surgical repair patient now stable Acute on chronic renal insufficiency somewhat improved recommend clinically heart therapy Hyperlipidemia patient on Lipitor Zetia Continue hydralazine for depressed left ventricular function Patient had a ZOLL vest arranged for the past left ventricular function EF of less than 35% will have the patient follow-up with EP as an outpatient for possible defibrillator Chronic systolic congestive heart failure EF now 25 to 30% since recent myocardial infarction continue aggressive therapy and home Imdur hydralazine Coreg Increase activity with physical therapy Status post pleural effusion with thoracentesis Renal insufficiency stage III continue to have nephrology input acute on chronic now improving renal function since acute decline during this hospitalization creatinine down to 1.18 GFR up to 47 from a low of 18 Known peripheral vascular disease right CEA surgery in the past Tobacco abuse advised patient to refrain  from tobacco consumption Atrial fibrillation will recommend anticoagulation with Eliquis once stable we will continue Plavix and aspirin for 2 weeks before discontinuing aspirin Will continue to follow hopefully patient will be stable enough for discharge soon  Alwyn Pea, MD 04/19/2023 8:55 AM

## 2023-04-19 NOTE — Progress Notes (Signed)
VAST consult received to obtain IV access prior to removing patient's central line. Assessed patient's right arm utilizing ultrasound to determine possible IV sites; 2 were noted and marked with skin marker. Pt currently has stable vital signs and is not receiving any IV fluids or medications. Per policy, just in case IVs should not be placed. Informed patient's RN and physician that if circumstances change and IV access is needed, an IVT consult should be placed. If access is needed emergently the consult should be placed as STAT and a reason documented. Pt's nurse and Rance Muir, MD verbalized understanding via SecureChat.

## 2023-04-20 DIAGNOSIS — I214 Non-ST elevation (NSTEMI) myocardial infarction: Secondary | ICD-10-CM | POA: Diagnosis not present

## 2023-04-20 LAB — CBC
HCT: 29.6 % — ABNORMAL LOW (ref 36.0–46.0)
Hemoglobin: 9.8 g/dL — ABNORMAL LOW (ref 12.0–15.0)
MCH: 28.2 pg (ref 26.0–34.0)
MCHC: 33.1 g/dL (ref 30.0–36.0)
MCV: 85.1 fL (ref 80.0–100.0)
Platelets: 223 10*3/uL (ref 150–400)
RBC: 3.48 MIL/uL — ABNORMAL LOW (ref 3.87–5.11)
RDW: 17.2 % — ABNORMAL HIGH (ref 11.5–15.5)
WBC: 19.4 10*3/uL — ABNORMAL HIGH (ref 4.0–10.5)
nRBC: 0.1 % (ref 0.0–0.2)

## 2023-04-20 LAB — RENAL FUNCTION PANEL
Albumin: 2.8 g/dL — ABNORMAL LOW (ref 3.5–5.0)
Anion gap: 11 (ref 5–15)
BUN: 22 mg/dL (ref 8–23)
CO2: 23 mmol/L (ref 22–32)
Calcium: 8.1 mg/dL — ABNORMAL LOW (ref 8.9–10.3)
Chloride: 103 mmol/L (ref 98–111)
Creatinine, Ser: 1.06 mg/dL — ABNORMAL HIGH (ref 0.44–1.00)
GFR, Estimated: 53 mL/min — ABNORMAL LOW (ref >60)
Glucose, Bld: 105 mg/dL — ABNORMAL HIGH (ref 70–99)
Phosphorus: 2.4 mg/dL — ABNORMAL LOW (ref 2.5–4.6)
Potassium: 3.1 mmol/L — ABNORMAL LOW (ref 3.5–5.1)
Sodium: 137 mmol/L (ref 135–145)

## 2023-04-20 MED ORDER — ISOSORBIDE MONONITRATE ER 60 MG PO TB24: 60.0000 mg | ORAL_TABLET | Freq: Every day | ORAL | 0 refills | Status: DC

## 2023-04-20 MED ORDER — K PHOS MONO-SOD PHOS DI & MONO 155-852-130 MG PO TABS
250.0000 mg | ORAL_TABLET | Freq: Once | ORAL | Status: AC
  Administered 2023-04-20: 250 mg via ORAL
  Filled 2023-04-20: qty 1

## 2023-04-20 MED ORDER — POTASSIUM PHOSPHATES 15 MMOLE/5ML IV SOLN
30.0000 mmol | Freq: Once | INTRAVENOUS | Status: DC
  Filled 2023-04-20: qty 10

## 2023-04-20 MED ORDER — ASPIRIN 81 MG PO CHEW: 81.0000 mg | CHEWABLE_TABLET | Freq: Every day | ORAL | Status: DC

## 2023-04-20 MED ORDER — PANTOPRAZOLE SODIUM 40 MG PO TBEC: 40.0000 mg | DELAYED_RELEASE_TABLET | Freq: Every day | ORAL | 0 refills | Status: DC

## 2023-04-20 MED ORDER — POTASSIUM CHLORIDE CRYS ER 20 MEQ PO TBCR
40.0000 meq | EXTENDED_RELEASE_TABLET | Freq: Once | ORAL | Status: AC
  Administered 2023-04-20: 40 meq via ORAL
  Filled 2023-04-20: qty 2

## 2023-04-20 MED ORDER — ACETAMINOPHEN 325 MG PO TABS: 650.0000 mg | ORAL_TABLET | ORAL | Status: DC | PRN

## 2023-04-20 NOTE — TOC Transition Note (Signed)
Transition of Care [facility]) - CM/SW Discharge Note   Patient Details  Name: Dawn Bruce MRN: [id number] Date of Birth: 1945-09-05  [facility] of [facility]) CM/SW Contact:  Truddie Hidden, RN Phone Number: 04/20/2023, 2:49 PM   Clinical Narrative:     Spoke with patient and her spouse at bedside regarding discharge plan. Patient spouse will transport her home. They have been advised a representative from [employer] will contact them regarding the [facility]. Patient requesting a RW.   Request for RW sent to Cletis Athens from [employer] from [place] notified.   TOC signing off.          Patient Goals and CMS Choice      Discharge Placement                         Discharge Plan and Services Additional resources added to the After Visit Summary for       Post Acute Care Choice: Home Health                    HH Arranged: PT, OT, RN [facility] Agency: [facility] Date [facility] Agency Contacted: 04/17/23   Representative spoke with at [facility] Agency: Kandee Keen  Social Determinants of Health (SDOH) Interventions SDOH Screenings   Food Insecurity: No Food Insecurity (04/13/2023)  Housing: Low Risk  (04/13/2023)  Transportation Needs: No Transportation Needs (04/13/2023)  Utilities: Not At Risk (04/13/2023)  Alcohol Screen: Low Risk  (12/08/2022)  Depression (PHQ2-9): Low Risk  (12/08/2022)  Financial Resource Strain: Low Risk  (10/13/2022)  Physical Activity: Insufficiently Active (10/13/2022)  Social Connections: Unknown (10/13/2022)  Stress: No Stress Concern Present (10/13/2022)  Tobacco Use: Medium Risk (04/13/2023)     Readmission Risk Interventions     No data to display

## 2023-04-20 NOTE — Progress Notes (Signed)
Patient transported to personal car via wheelchair.  All belongings packed and taken out to car including walker, bedside commode, and LifeVest.  Patient still refused to place vest on.  No s/s of distress noted discharged at this time.

## 2023-04-20 NOTE — Progress Notes (Signed)
    Subjective  - POD #7, status post repair of right femoral pseudoaneurysm  Prevena wound VAC continues to alarm   Physical Exam:  Burnice Logan was removed as it was end-of-life.  Staples are intact.  There is some oozing from through the staple line.  Blistering from the tape from the Praveena is present       Assessment/Plan:    Dry gauze was placed over top of the incision.  I discussed with the patient and husband that this may need to be changed several times a day as a will likely get saturated.  I do not think that replacing the Burnice Logan is a good idea at this time given the blistering on the skin.  She is okay to be discharged home from my perspective with follow-up in the office in 1-2 weeks for wound check.  Dawn Bruce 04/20/2023 11:46 AM --  Vitals:   04/20/23 0427 04/20/23 0816  BP: (!) 171/81 (!) 160/75  Pulse: 97 95  Resp: 18 16  Temp: 99.9 F (37.7 C) 98 F (36.7 C)  SpO2: 97% 97%    Intake/Output Summary (Last 24 hours) at 04/20/2023 1146 Last data filed at 04/20/2023 0816 Gross per 24 hour  Intake 360 ml  Output --  Net 360 ml     Laboratory CBC    Component Value Date/Time   WBC 19.4 (H) 04/20/2023 0411   HGB 9.8 (L) 04/20/2023 0411   HGB 13.6 06/01/2022 0000   HCT 29.6 (L) 04/20/2023 0411   HCT 40.5 06/01/2022 0000   PLT 223 04/20/2023 0411   PLT 132 (L) 06/01/2022 0000    BMET    Component Value Date/Time   NA 137 04/20/2023 0411   NA 147 (H) 06/01/2022 0000   K 3.1 (L) 04/20/2023 0411   CL 103 04/20/2023 0411   CO2 23 04/20/2023 0411   GLUCOSE 105 (H) 04/20/2023 0411   BUN 22 04/20/2023 0411   BUN 23 06/01/2022 0000   CREATININE 1.06 (H) 04/20/2023 0411   CALCIUM 8.1 (L) 04/20/2023 0411   GFRNONAA 53 (L) 04/20/2023 0411   GFRAA 54 (L) 10/08/2019 1419    COAG Lab Results  Component Value Date   INR 1.2 04/16/2023   INR 1.1 04/13/2023   No results found for: "PTT"  Antibiotics Anti-infectives (From admission,  onward)    None        V. Charlena Cross, M.D., [facility] [place] Office: [phone] Pager:  [phone]

## 2023-04-20 NOTE — Discharge Summary (Signed)
Physician Discharge Summary  Dawn Bruce XNA:[id number] DOB: 15-Jul-1945 DOA: 04/12/2023  PCP: Erasmo Downer, MD  Admit date: 04/12/2023 Discharge date: 04/20/2023  Admitted From: home  Disposition:  home w/ home health   Recommendations for Outpatient Follow-up:  Follow up with PCP in 1-2 weeks F/u vasc surg, NP Manson Passey, in 3 weeks F/u w/ cardio, [facility], in 1 week   Home Health: yes  Equipment/Devices: lifevest   Discharge Condition: stable  CODE STATUS: full  Diet recommendation: Heart Healthy   Brief/Interim Summary: HPI was taken from Dr. Irena Cords: Rudolph Finnemore is a 77 y.o. female with medical history significant for essential hypertension history of tobacco abuse, history of skin cancer , presenting today with chest discomfort.  Patient has a patient has a history of heart disease and stents.  Has seen Dr. Janee Morn for cardiology.  Patient was brought by EMS when the chest pain started about an hour ago patient was in hypertensive emergency on presentation.  Chest pain started at rest when she was in with she checked her blood pressure found that her blood pressure was very high.  Pain continued about 4 hours and persisted and got worse she decided to come to the emergency room. At bedside she is alert awake oriented room sinus rhythm no reports of fevers chills trauma travel.  Spouse at bedside.  Discussed with patient that her presentation for chest pressure and her blood work are also concerning for an NSTEMI but it is also necessary that we identify any VTE's which may require CT scan of the chest I will confirm that she can indeed have 1 with her current abnormal renal function and order if she meets the threshold for creatinine.   In emergency room vitals trend shows: Multiple Vitals        Vitals:    04/12/23 2230 04/12/23 2240 04/13/23 0115 04/13/23 0121  BP:       (!) 136/111  Pulse:     95 97  Temp:   98.1 F (36.7 C)      Resp: 16   12 16   Height:           Weight:          SpO2:     97% 99%  TempSrc:   Oral      BMI (Calculated):            EKG today was sinus rhythm at 81 with no ST-T wave changes.  Troponin 0.4 initially was 27 and repeat was 609. Labs are notable for : Metabolic panel with potassium of 3.4 glucose 187 CKD stage IIIa creatinine 1.29 with a EGFR of 42. LFTs within normal limits. CBC showing thrombocytopenia of 143 otherwise normal.  Thrombocytopenia is chronic   As per NP Elvina Sidle: 11/21: Admitted to stepdown unit under hospitalist service with NSTEMI .  11/22: S/p Left Thoracentesis . S/p LHC with LAD stenting via the right groin. Postop pt became hypotensive requiring vasopressor with right groin pain. CTA obtained and showed large right retroperitoneal hematoma along with active extravasation from the right external iliac and small vol hemoperitoneum about the liver and in the pelvis. PCCM consulted, pt Intubated for airway protection and pt taken emergently to OR by vascular for repair. 04/15/23- Patient is off vasopressor support. EXTUBATED.  Mild thrombocytopenia.  Repeat CXR and KUB today.   04/16/23- Tolerating extubation, remains off vasopressors. Hemoglobin remains stable.  Consult Nephrology for worsening AKI and oliguria.  Remove A-line and central line.  Discharge Diagnoses:  Principal Problem:   Chest pain Active Problems:   NSTEMI (non-ST elevated myocardial infarction) (HCC)   CKD (chronic kidney disease) stage 3, GFR 30-59 ml/min (HCC)   Essential hypertension   Chronic diastolic heart failure (HCC)   Tobacco abuse  Retroperitoneal hematoma & hemorrhagic shock: resolved. Complication of Iliac artery laceration during left heart cath. Underwent surgical repair of external iliac on 11/23. H&H are labile but no need for transfusion currently. Vascular surgery following. Will need to f/u outpatient f/u in 3 weeks to remove staples R groin. Wound vac in place for 7-10 days total as per vasc surg     NSTEMI:. LHC 11/22 with 99% ostial to prox LAD lesion s/p DES with 3.5 x 12 mm Onyx frontier.  Continue on aspirin, plavix, statin, zetia, coreg, imdur, hydralazine as per cardio. Hx of CAD & ischemic cardiomyopathy.    Chronic systolic CHF: EF 11-91%. Has a lifevest but needs more education as per pt. Continue on aspirin, statin, coreg, plavix, zetia, hydralazine, imdur as per cardio    RLS: can f/u outpatient w/ PCP    PAF: continue on eliquis, plavix & aspirin x 2 weeks & then d/c aspirin     Recurrent L pleural effusion: s/p thoracentesis 11/22 with 650 mL removed.   CKDIIIa: Cr is trending down from day prior. Nephro signed off 04/19/23    Chronic pain: continue w/ home fentanyl patch   Discharge Instructions  Discharge Instructions     AMB Referral to Cardiac Rehabilitation - Phase II   Complete by: As directed    Diagnosis:  NSTEMI Coronary Stents     After initial evaluation and assessments completed: Virtual Based Care may be provided alone or in conjunction with Phase 2 Cardiac Rehab based on patient barriers.: Yes   Intensive Cardiac Rehabilitation (ICR) [facility] location only OR Traditional Cardiac Rehabilitation (TCR) *If criteria for ICR are not met will enroll in TCR [facility] only): Yes   AMB Referral to Cardiac Rehabilitation - Phase II   Complete by: As directed    Diagnosis:  NSTEMI Coronary Stents     After initial evaluation and assessments completed: Virtual Based Care may be provided alone or in conjunction with Phase 2 Cardiac Rehab based on patient barriers.: Yes   Intensive Cardiac Rehabilitation (ICR) [facility] location only OR Traditional Cardiac Rehabilitation (TCR) *If criteria for ICR are not met will enroll in TCR [facility] only): Yes   Diet - low sodium heart healthy   Complete by: As directed    Discharge instructions   Complete by: As directed    F/u w/ vascular surg, NP Manson Passey, in 3 weeks. F/u cardio, [facility], in 1 week. F/u w/ PCP in 1-2 weeks.   Increase  activity slowly   Complete by: As directed       Allergies as of 04/20/2023   No Known Allergies      Medication List     TAKE these medications    aspirin 81 MG chewable tablet Chew 1 tablet (81 mg total) by mouth daily for 10 days. Start taking on: April 21, 2023   atorvastatin 80 MG tablet Commonly known as: LIPITOR Take 80 mg by mouth daily.   carvedilol 6.25 MG tablet Commonly known as: COREG Take 1 tablet (6.25 mg total) by mouth 2 (two) times daily. What changed: how much to take   clopidogrel 75 MG tablet Commonly known as: PLAVIX Take 1 tablet (75 mg total) by mouth daily.  Eliquis 2.5 MG Tabs tablet Generic drug: apixaban Take 2.5 mg by mouth 2 (two) times daily.   ezetimibe 10 MG tablet Commonly known as: ZETIA TAKE 1 TABLET(10 MG) BY MOUTH DAILY   famotidine 40 MG tablet Commonly known as: PEPCID Take 1 tablet (40 mg total) by mouth daily. What changed:  when to take this reasons to take this   fentaNYL 25 MCG/HR Commonly known as: DURAGESIC Place 2 patches onto the skin every 3 (three) days.   isosorbide mononitrate 60 MG 24 hr tablet Commonly known as: IMDUR Take 1 tablet (60 mg total) by mouth daily. Start taking on: April 21, 2023   pantoprazole 40 MG tablet Commonly known as: PROTONIX Take 1 tablet (40 mg total) by mouth daily. Start taking on: April 21, 2023   pregabalin 150 MG capsule Commonly known as: LYRICA Take 1 capsule (150 mg total) by mouth 2 (two) times daily.   traZODone 50 MG tablet Commonly known as: DESYREL Take 0.5-1 tablets (25-50 mg total) by mouth at bedtime as needed for sleep.   valsartan 160 MG tablet Commonly known as: DIOVAN Take 160 mg by mouth in the morning and at bedtime.               Durable Medical Equipment  (From admission, onward)           Start     Ordered   04/18/23 1542  For home use only DME Bedside commode  Once       Question:  Patient needs a bedside commode  to treat with the following condition  Answer:  Weakness   04/18/23 1542            Follow-up Information     Gilman Buttner, MD. Go in 1 week(s).   Specialty: Student Contact information: [address] [place] [address] [phone]         Georgiana Spinner, NP Follow up in 3 week(s).   Specialty: Vascular Surgery Why: Staple removal. Ultrasound right lower extremity with ABI Contact information: [address] [place] [address] [phone]                No Known Allergies  Consultations: Cardio Nephro ICU  Vasc surg    Procedures/Studies: US Venous Img Lower Unilateral Right (DVT)  Result Date: 04/18/2023 CLINICAL DATA:  Lower extremity edema EXAM: RIGHT LOWER EXTREMITY VENOUS DOPPLER ULTRASOUND TECHNIQUE: Gray-scale sonography with compression, as well as color and duplex ultrasound, were performed to evaluate the deep venous system(s) from the level of the common femoral vein through the popliteal and proximal calf veins. COMPARISON:  None Available. FINDINGS: VENOUS The common femoral vein and central profundus femoral vein is obscured by overlying medical device. Normal compressibility of the visualized femoral and popliteal veins, as well as the visualized calf veins. No filling defects to suggest DVT on grayscale or color Doppler imaging. Doppler waveforms show normal direction of venous flow, normal respiratory plasticity and response to augmentation. Limited views of the contralateral common femoral vein are unremarkable. OTHER 4.4 cm fluid collection within the popliteal fossa noted in keeping with a Baker cyst. Limitations: none IMPRESSION: 1. Limited evaluation of the right common femoral and central profundus femoral veins. No evidence of femoropopliteal DVT or calf DVT within the visualized right lower extremity. 2. 4.4 cm Baker cyst. Electronically Signed   By: Helyn Numbers M.D.   On: 04/18/2023 20:55   US Venous Img Upper Uni  Right(DVT)  Result Date: 04/16/2023 CLINICAL DATA:  Right upper extremity swelling for 3 days EXAM: RIGHT UPPER EXTREMITY VENOUS DOPPLER ULTRASOUND TECHNIQUE: Gray-scale sonography with graded compression, as well as color Doppler and duplex ultrasound were performed to evaluate the upper extremity deep venous system from the level of the subclavian vein and including the jugular, axillary, basilic, radial, ulnar and upper cephalic vein. Spectral Doppler was utilized to evaluate flow at rest and with distal augmentation maneuvers. COMPARISON:  None Available. FINDINGS: Contralateral Subclavian Vein: Respiratory phasicity is normal and symmetric with the symptomatic side. No evidence of thrombus. Normal compressibility. Internal Jugular Vein: No evidence of thrombus. Normal compressibility, respiratory phasicity and response to augmentation. Subclavian Vein: No evidence of thrombus. Normal compressibility, respiratory phasicity and response to augmentation. Axillary Vein: No evidence of thrombus. Normal compressibility, respiratory phasicity and response to augmentation. Cephalic Vein: Occlusive superficial thrombophlebitis of the mid to distal right cephalic vein. Proximal right cephalic vein is patent and compressible and without thrombus. Basilic Vein: No evidence of thrombus. Normal compressibility, respiratory phasicity and response to augmentation. Brachial Veins: No evidence of thrombus. Normal compressibility, respiratory phasicity and response to augmentation. Radial Veins: No evidence of thrombus. Normal compressibility, respiratory phasicity and response to augmentation. Ulnar Veins: No evidence of thrombus. Normal compressibility, respiratory phasicity and response to augmentation. Venous Reflux:  None visualized. Other Findings: Right internal jugular central venous catheter seen within the right internal jugular vein without surrounding thrombus. IMPRESSION: 1. Occlusive superficial thrombophlebitis  of the mid to distal right cephalic vein. 2. No evidence of deep venous thrombosis in the right upper extremity. Electronically Signed   By: Delbert Phenix M.D.   On: 04/16/2023 13:47   DG Chest Port 1 View  Result Date: 04/16/2023 CLINICAL DATA:  77 year old female with history of pleural effusions. EXAM: PORTABLE CHEST 1 VIEW COMPARISON:  Chest x-ray 04/15/2023. FINDINGS: Patient has been extubated. Nasogastric tube seen on the prior study has been removed. Right internal jugular central venous catheter with tip terminating at the superior cavoatrial junction. Opacity at the left base which may reflect atelectasis and/or consolidation. Small left pleural effusion. Right lung appears clear. No definite right pleural effusion. No pneumothorax. No evidence of pulmonary edema. Heart size is normal. Upper mediastinal contours are within normal limits. IMPRESSION: 1. Support apparatus, as above. 2. Atelectasis and/or consolidation in the left lung base with small left pleural effusion. Electronically Signed   By: Trudie Reed M.D.   On: 04/16/2023 06:56   US RENAL  Result Date: 04/15/2023 CLINICAL DATA:  Acute renal failure. EXAM: RENAL / URINARY TRACT ULTRASOUND COMPLETE COMPARISON:  CT abdomen pelvis dated April 13, 2023. FINDINGS: Right Kidney: Renal measurements: 7.5 x 4.3 x 4.1 cm = volume: 69 mL. Echogenicity within normal limits. No mass or hydronephrosis visualized. Left Kidney: Renal measurements: 7.7 x 4.2 x 3.6 cm = volume: 61 mL. Echogenicity within normal limits. No mass or hydronephrosis visualized. Bladder: Decompressed by Foley catheter. Other: Bilateral pleural effusions. Small amount of fluid in the upper abdomen. IMPRESSION: 1. No acute abnormality.  No hydronephrosis. Electronically Signed   By: Obie Dredge M.D.   On: 04/15/2023 18:02   DG Abd 1 View  Result Date: 04/15/2023 CLINICAL DATA:  Right retroperitoneal hematoma. EXAM: ABDOMEN - 1 VIEW COMPARISON:  04/14/2023  FINDINGS: No gaseous bowel dilatation. Foley catheter noted in the urinary bladder. Persistent contrast retention in the kidneys. Skin staples overlie the right groin region. IMPRESSION: 1. Nonobstructive bowel gas pattern. 2. Persistent contrast retention in  the kidneys. Electronically Signed   By: Kennith Center M.D.   On: 04/15/2023 14:11   DG Chest Port 1 View  Result Date: 04/15/2023 CLINICAL DATA:  Endotracheal tube positioning/placement. EXAM: PORTABLE CHEST 1 VIEW COMPARISON:  Chest radiograph dated 04/14/2023. FINDINGS: The heart size and mediastinal contours are within normal limits. There is mild bibasilar atelectasis/airspace disease. No pneumothorax. An endotracheal tube terminates in the midthoracic trachea. An enteric tube enters the stomach and terminates below the field of view. IMPRESSION: Mild bibasilar atelectasis/airspace disease. Electronically Signed   By: Romona Curls M.D.   On: 04/15/2023 13:56   ECHOCARDIOGRAM COMPLETE BUBBLE STUDY  Result Date: 04/14/2023    ECHOCARDIOGRAM REPORT   Patient Name:   TEMAKA [facility] Date of Exam: 04/14/2023 Medical Rec #:  [id number]   Height:       63.0 in Accession #:    [id number]  Weight:       128.0 lb Date of Birth:  [birth year]  BSA:          1.600 m Patient Age:    66 years    BP:           92/63 mmHg Patient Gender: F           HR:           86 bpm. Exam Location:  [facility] Procedure: 2D Echo, Cardiac Doppler, Color Doppler and Saline Contrast Bubble            Study Indications:     CHF (congestive heart failure) (HCC) [253664]  History:         Patient has prior history of Echocardiogram examinations. Risk                  Factors:Hypertension.  Sonographer:     Neysa Bonito Roar Referring Phys:  [id number] Dorian Pod HUDSON Diagnosing Phys: Marcina Millard MD IMPRESSIONS  1. Left ventricular ejection fraction, by estimation, is 25 to 30%. The left ventricle has severely decreased function. The left ventricle demonstrates regional wall motion  abnormalities (see scoring diagram/findings for description). Left ventricular diastolic parameters were normal.  2. Right ventricular systolic function is normal. The right ventricular size is normal.  3. The mitral valve is normal in structure. Mild mitral valve regurgitation. No evidence of mitral stenosis.  4. The aortic valve is normal in structure. Aortic valve regurgitation is not visualized. No aortic stenosis is present.  5. The inferior vena cava is normal in size with greater than 50% respiratory variability, suggesting right atrial pressure of 3 mmHg. FINDINGS  Left Ventricle: Left ventricular ejection fraction, by estimation, is 25 to 30%. The left ventricle has severely decreased function. The left ventricle demonstrates regional wall motion abnormalities. The left ventricular internal cavity size was normal  in size. There is no left ventricular hypertrophy. Left ventricular diastolic parameters were normal.  LV Wall Scoring: The apical lateral segment, apical septal segment, and apex are akinetic. The apical anterior segment and apical inferior segment are hypokinetic. Right Ventricle: The right ventricular size is normal. No increase in right ventricular wall thickness. Right ventricular systolic function is normal. Left Atrium: Left atrial size was normal in size. Right Atrium: Right atrial size was normal in size. Pericardium: Trivial pericardial effusion is present. Mitral Valve: The mitral valve is normal in structure. Mild mitral valve regurgitation. No evidence of mitral valve stenosis. MV peak gradient, 5.5 mmHg. The mean mitral valve gradient is 3.0 mmHg. Tricuspid Valve: The tricuspid valve is normal in structure.  Tricuspid valve regurgitation is not demonstrated. No evidence of tricuspid stenosis. Aortic Valve: The aortic valve is normal in structure. Aortic valve regurgitation is not visualized. No aortic stenosis is present. Aortic valve mean gradient measures 3.0 mmHg. Aortic valve peak  gradient measures 4.6 mmHg. Aortic valve area, by VTI measures 1.14 cm. Pulmonic Valve: The pulmonic valve was normal in structure. Pulmonic valve regurgitation is not visualized. No evidence of pulmonic stenosis. Aorta: The aortic root is normal in size and structure. Venous: The inferior vena cava is normal in size with greater than 50% respiratory variability, suggesting right atrial pressure of 3 mmHg. IAS/Shunts: No atrial level shunt detected by color flow Doppler. Agitated saline contrast was given intravenously to evaluate for intracardiac shunting. Additional Comments: There is a small pleural effusion in the left lateral region.  LEFT VENTRICLE PLAX 2D LVIDd:         3.90 cm     Diastology LVIDs:         3.40 cm     LV e' medial:    2.84 cm/s LV PW:         1.00 cm     LV E/e' medial:  28.2 LV IVS:        1.10 cm     LV e' lateral:   4.95 cm/s LVOT diam:     1.40 cm     LV E/e' lateral: 16.2 LV SV:         23 LV SV Index:   14 LVOT Area:     1.54 cm  LV Volumes (MOD) LV vol d, MOD A2C: 48.3 ml LV vol d, MOD A4C: 39.9 ml LV vol s, MOD A2C: 35.0 ml LV vol s, MOD A4C: 28.3 ml LV SV MOD A2C:     13.3 ml LV SV MOD A4C:     39.9 ml LV SV MOD BP:      12.4 ml RIGHT VENTRICLE RV Basal diam:  2.40 cm RV Mid diam:    2.20 cm RV S prime:     6.97 cm/s TAPSE (M-mode): 0.9 cm LEFT ATRIUM             Index        RIGHT ATRIUM          Index LA diam:        3.10 cm 1.94 cm/m   RA Area:     7.55 cm LA Vol (A2C):   39.3 ml 24.57 ml/m  RA Volume:   12.20 ml 7.63 ml/m LA Vol (A4C):   31.6 ml 19.76 ml/m LA Biplane Vol: 35.1 ml 21.94 ml/m  AORTIC VALVE                    PULMONIC VALVE AV Area (Vmax):    1.34 cm     PV Vmax:        0.85 m/s AV Area (Vmean):   1.26 cm     PV Peak grad:   2.9 mmHg AV Area (VTI):     1.14 cm     RVOT Peak grad: 2 mmHg AV Vmax:           107.00 cm/s AV Vmean:          74.200 cm/s AV VTI:            0.203 m AV Peak Grad:      4.6 mmHg AV Mean Grad:      3.0 mmHg LVOT Vmax:  93.40  cm/s LVOT Vmean:        60.600 cm/s LVOT VTI:          0.150 m LVOT/AV VTI ratio: 0.74  AORTA Ao Root diam: 1.90 cm Ao Asc diam:  2.30 cm MITRAL VALVE                TRICUSPID VALVE MV Area (PHT): 5.84 cm     TR Peak grad:   23.6 mmHg MV Area VTI:   0.95 cm     TR Vmax:        243.00 cm/s MV Peak grad:  5.5 mmHg MV Mean grad:  3.0 mmHg     SHUNTS MV Vmax:       1.17 m/s     Systemic VTI:  0.15 m MV Vmean:      80.4 cm/s    Systemic Diam: 1.40 cm MV Decel Time: 130 msec MV E velocity: 80.20 cm/s MV A velocity: 105.00 cm/s MV E/A ratio:  0.76 MV A Prime:    7.1 cm/s Marcina Millard MD Electronically signed by Marcina Millard MD Signature Date/Time: 04/14/2023/2:42:41 PM    Final    DG Abd 1 View  Result Date: 04/14/2023 CLINICAL DATA:  Orogastric tube placement EXAM: ABDOMEN - 1 VIEW COMPARISON:  None Available. FINDINGS: Orogastric tube tip seen within the proximal body of the stomach with the proximal side hole in the region of the gastroesophageal junction. Nonobstructive bowel gas pattern. No gross free intraperitoneal gas. No organomegaly. Retained contrast opacifies the renal cortices bilaterally related to CT arteriogram performed 04/13/2023. Degenerative changes noted within the lumbar spine. IMPRESSION: 1. Orogastric tube tip within the proximal body of the stomach. Advancement of the catheter by 5-10 cm may more optimally position the device. Electronically Signed   By: Helyn Numbers M.D.   On: 04/14/2023 02:28   DG Chest Port 1 View  Result Date: 04/14/2023 CLINICAL DATA:  Chest tube placement EXAM: PORTABLE CHEST 1 VIEW COMPARISON:  04/13/2023 FINDINGS: Endotracheal tube is seen 2.2 cm above the carina. Nasogastric tube extends into the upper abdomen beyond margin of the examination. Right internal jugular central venous catheter is in place with its tip at the superior cavoatrial junction. Lung volumes are small with mild elevation of the right hemidiaphragm. Retrocardiac opacity  persists in keeping with atelectasis or infiltrate within this region. Lungs are otherwise clear. No pneumothorax or pleural effusion. Cardiac size within normal limits. Pulmonary vascularity is normal. No acute bone abnormality. No chest tube identified within the visualized thorax. IMPRESSION: 1. Support tubes in appropriate position. 2. No chest tube identified within the visualized thorax. No pneumothorax. 3. Persistent retrocardiac atelectasis or infiltrate. Electronically Signed   By: Helyn Numbers M.D.   On: 04/14/2023 02:26   CARDIAC CATHETERIZATION  Result Date: 04/13/2023   Mid LAD lesion is 30% stenosed.   Ost LAD to Prox LAD lesion is 99% stenosed.   A drug-eluting stent was successfully placed using a STENT ONYX FRONTIER 3.5X12.   Post intervention, there is a 0% residual stenosis.   There is mild left ventricular systolic dysfunction.   The left ventricular ejection fraction is 45-50% by visual estimate. 1.  High-grade in-stent restenosis ostial LAD 2.  Successful PCI with 3.5 x 12 mm Onyx frontier DES 3.  Right groin hematoma Recommendations 1.  Dual antiplatelet therapy uninterrupted 1 year 2.  Abdominal CT to rule out retroperitoneal bleed 3.  Further recommendations pending abdominal CT results   CT Angio  Abd/Pel w/ and/or w/o  Result Date: 04/13/2023 CLINICAL DATA:  Suspected retroperitoneal hemorrhage from cath lab EXAM: CTA ABDOMEN AND PELVIS WITHOUT AND WITH CONTRAST TECHNIQUE: Multidetector CT imaging of the abdomen and pelvis was performed using the standard protocol during bolus administration of intravenous contrast. Multiplanar reconstructed images and MIPs were obtained and reviewed to evaluate the vascular anatomy. RADIATION DOSE REDUCTION: This exam was performed according to the departmental dose-optimization program which includes automated exposure control, adjustment of the mA and/or kV according to patient size and/or use of iterative reconstruction technique. CONTRAST:   80mL OMNIPAQUE IOHEXOL 350 MG/ML SOLN COMPARISON:  CT abdomen pelvis, 12/13/2021 FINDINGS: VASCULAR Large right-sided retroperitoneal hematoma arising in the vicinity of the right external iliac artery and extending into the right paracolic gutter, dimensions at least 18.6 x 7.7 x 6.2 cm (series 15, image 44, series 10, image 139). Brisk arterial extravasation arising in the vicinity of the right inferior epigastric artery origin and distal portion of the right external iliac artery (series 10, image 161, series 15, image 44). Normal contour and caliber of the abdominal aorta. No evidence of aortic aneurysm, dissection, or other acute aortic pathology. 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 Moderate mixed calcific atherosclerosis. Review of the MIP images confirms the above findings. NON-VASCULAR Lower Chest: Moderate left, small right pleural effusions and associated atelectasis or consolidation. Bilateral breast implants small hiatal hernia. Hepatobiliary: No solid liver abnormality is seen. No gallstones, gallbladder wall thickening, or biliary dilatation. Pancreas: Unremarkable. No pancreatic ductal dilatation or surrounding inflammatory changes. Spleen: Normal in size without significant abnormality. Adrenals/Urinary Tract: Adrenal glands are unremarkable. Extensive, lobulated bilateral renal cortical scarring. No calculi or hydronephrosis. Excreted contrast within the collecting systems and bladder following catheterization. Foley catheter in the bladder. Stomach/Bowel: Stomach is within normal limits. Appendix appears normal. No evidence of bowel wall thickening, distention, or inflammatory changes. Sigmoid diverticula. Lymphatic: No enlarged abdominal or pelvic lymph nodes. Reproductive: No mass or other significant abnormality. Other: No abdominal wall hernia or abnormality. Small volume hemoperitoneum about the liver  and in the pelvis (series 10, image 156, series 5, image 17). Musculoskeletal: No acute osseous findings. IMPRESSION: 1. Large right-sided retroperitoneal hematoma arising in the vicinity of the right external iliac artery and extending into the right paracolic gutter, dimensions at least 18.6 x 7.7 x 6.2 cm. 2. Brisk arterial extravasation arising in the vicinity of the right inferior epigastric artery origin and distal portion of the right external iliac artery. 3. Small volume hemoperitoneum about the liver and in the pelvis. 4. Moderate left, small right pleural effusions and associated atelectasis or consolidation. These results were called by telephone at the time of interpretation on 04/13/2023 at 9:25 pm to [facility] , who verbally acknowledged these results. Aortic Atherosclerosis (ICD10-I70.0). Electronically Signed   By: Jearld Lesch M.D.   On: 04/13/2023 21:38   US THORACENTESIS ASP PLEURAL SPACE W/IMG GUIDE  Result Date: 04/13/2023 INDICATION: Patient with history of CAD s/p stenting, admitted with complaints of new chest pain and found to have large left pleural effusion. Request for diagnostic and therapeutic left thoracentesis. EXAM: ULTRASOUND GUIDED LEFT THORACENTESIS MEDICATIONS: 15 mL 1% lidocaine COMPLICATIONS: None immediate. PROCEDURE: An ultrasound guided thoracentesis was thoroughly discussed with the patient and questions answered. The benefits, risks, alternatives and complications were also discussed. The patient understands and wishes to proceed with the procedure. Written consent was obtained. Ultrasound was performed to localize  and mark an adequate pocket of fluid in the left chest. The area was then prepped and draped in the normal sterile fashion. 1% Lidocaine was used for local anesthesia. Under ultrasound guidance a 6 Fr Safe-T-Centesis catheter was introduced. Thoracentesis was performed. The catheter was removed and a dressing applied. FINDINGS: A total of  approximately 650 mL of clear yellow fluid was removed. Samples were sent to the laboratory as requested by the clinical team. IMPRESSION: Successful ultrasound guided left thoracentesis yielding 650 mL of pleural fluid. Performed by Lynnette Caffey, PA-C Electronically Signed   By: Olive Bass M.D.   On: 04/13/2023 14:13   DG Chest Port 1 View  Result Date: 04/13/2023 CLINICAL DATA:  [id number] Pleural effusion on left [id number]. Status post thoracentesis. EXAM: PORTABLE CHEST 1 VIEW COMPARISON:  04/12/2023. FINDINGS: There is small left pleural effusion with probable associated compressive atelectatic changes in the left lung. There is interval improvement when compared to the prior exam, status post left thoracentesis. No left pneumothorax. Biapical pleural thickening/calcifications again seen. Bilateral lung fields are otherwise clear. There is subtle blunting of right lateral costophrenic angle, which may represent trace right pleural effusion. Normal cardio-mediastinal silhouette. No acute osseous abnormalities. The soft tissues are within normal limits. IMPRESSION: *Small left pleural effusion, improved since the prior study, status post left thoracentesis. No pneumothorax. Electronically Signed   By: Jules Schick M.D.   On: 04/13/2023 12:58   CT Angio Chest Pulmonary Embolism (PE) W or WO Contrast  Result Date: 04/12/2023 CLINICAL DATA:  Chest pain for 1 hour, hypertension EXAM: CT ANGIOGRAPHY CHEST WITH CONTRAST TECHNIQUE: Multidetector CT imaging of the chest was performed using the standard protocol during bolus administration of intravenous contrast. Multiplanar CT image reconstructions and MIPs were obtained to evaluate the vascular anatomy. RADIATION DOSE REDUCTION: This exam was performed according to the departmental dose-optimization program which includes automated exposure control, adjustment of the mA and/or kV according to patient size and/or use of iterative reconstruction technique.  CONTRAST:  60mL OMNIPAQUE IOHEXOL 350 MG/ML SOLN COMPARISON:  12/28/2020, 04/12/2023 FINDINGS: Cardiovascular: This is a technically adequate evaluation of the pulmonary vasculature. No filling defects or pulmonary emboli. The heart is unremarkable without pericardial effusion. Normal caliber of the thoracic aorta. Atherosclerosis of the aorta and coronary vasculature. Mediastinum/Nodes: No enlarged mediastinal, hilar, or axillary lymph nodes. Thyroid gland, trachea, and esophagus demonstrate no significant findings. Lungs/Pleura: Large left pleural effusion, volume estimated in excess of 1 L. dependent consolidation within the left lower lobe most consistent with atelectasis. There is a trace right pleural effusion. No airspace disease or pneumothorax. Bilateral bronchial wall thickening, greatest in the lower lobes. Upper Abdomen: No acute abnormality. Musculoskeletal: No acute or destructive bony abnormalities. Reconstructed images demonstrate no additional findings. Review of the MIP images confirms the above findings. IMPRESSION: 1. No evidence of pulmonary embolus. 2. Large left pleural effusion, volume estimated in excess of 1 L. Dependent left lower lobe atelectasis. 3. Trace right pleural effusion. 4. Bilateral bronchial wall thickening, greatest in the lower lobes, which may reflect bronchitis or reactive airway disease. No evidence of pneumonia. 5. Aortic Atherosclerosis (ICD10-I70.0). Coronary artery atherosclerosis. Electronically Signed   By: Sharlet Salina M.D.   On: 04/12/2023 23:46   DG Chest 2 View  Result Date: 04/12/2023 CLINICAL DATA:  Chest pain.  Hypertension. EXAM: CHEST - 2 VIEW COMPARISON:  10/06/2021 FINDINGS: Midline trachea. Mild cardiomegaly. Increase in small to moderate left pleural effusion. Biapical pleural thickening. No pneumothorax. No congestive failure. Worsened  left lower lobe airspace disease. IMPRESSION: Increase in small to moderate left pleural effusion. Progressive  adjacent left base airspace disease which could represent atelectasis or infection. Cardiomegaly without congestive failure. Electronically Signed   By: Jeronimo Greaves M.D.   On: 04/12/2023 16:58   (Echo, Carotid, EGD, Colonoscopy, ERCP)    Subjective: Pt c/o malaise    Discharge Exam: Vitals:   04/20/23 0816 04/20/23 1203  BP: (!) 160/75 (!) 127/56  Pulse: 95 86  Resp: 16 16  Temp: 98 F (36.7 C) 97.9 F (36.6 C)  SpO2: 97% 96%   Vitals:   04/20/23 0051 04/20/23 0427 04/20/23 0816 04/20/23 1203  BP: (!) 172/84 (!) 171/81 (!) 160/75 (!) 127/56  Pulse: 94 97 95 86  Resp: (!) 22 18 16 16   Temp: 97.6 F (36.4 C) 99.9 F (37.7 C) 98 F (36.7 C) 97.9 F (36.6 C)  TempSrc: Oral Oral Oral   SpO2: 99% 97% 97% 96%  Weight:      Height:        General: Pt is alert, awake, not in acute distress Cardiovascular: S1/S2 +, no rubs, no gallops Respiratory: CTA bilaterally, no wheezing, no rhonchi Abdominal: Soft, NT, ND, bowel sounds + Extremities:  no cyanosis    The results of significant diagnostics from this hospitalization (including imaging, microbiology, ancillary and laboratory) are listed below for reference.     Microbiology: Recent Results (from the past 240 hour(s))  Body fluid culture w Gram Stain     Status: None   Collection Time: 04/13/23 11:47 AM   Specimen: PATH Cytology Pleural fluid  Result Value Ref Range Status   Specimen Description   Final    PLEURAL Performed at [facility], [address] [place], [place] [address]    Special Requests   Final    NONE Performed at [facility], [address] [place], [place] [address]    Gram Stain   Final    WBC PRESENT, PREDOMINANTLY MONONUCLEAR NO ORGANISMS SEEN CYTOSPIN SMEAR    Culture   Final    NO GROWTH 3 DAYS Performed at [facility], [address], [place], [place] [address]    Report Status 04/16/2023 FINAL  Final  MRSA Next Gen by PCR, Nasal     Status: None    Collection Time: 04/13/23 11:00 PM   Specimen: Nasal Mucosa; Nasal Swab  Result Value Ref Range Status   MRSA by PCR Next Gen NOT DETECTED NOT DETECTED Final    Comment: (NOTE) The GeneXpert MRSA Assay (FDA approved for NASAL specimens only), is one component of a comprehensive MRSA colonization surveillance program. It is not intended to diagnose MRSA infection nor to guide or monitor treatment for MRSA infections. Test performance is not FDA approved in patients less than 43 years old. Performed at [facility], [address] [place], [place] [address]      Labs: BNP (last 3 results) Recent Labs    04/12/23 1522  BNP 270.4*   Basic Metabolic Panel: Recent Labs  Lab 04/15/23 0420 04/15/23 1223 04/16/23 0356 04/17/23 0443 04/18/23 0320 04/19/23 0600 04/20/23 0411  NA 140  --  137 139 137  137 141 137  K 3.2* 3.5 3.7 3.3* 3.7  3.7 3.5 3.1*  CL 104  --  102 104 105  104 104 103  CO2 26  --  25 28 24  25 26 23   GLUCOSE 112*  --  101* 99 128*  134* 103* 105*  BUN 39*  --  43* 38* 32*  32* 28* 22  CREATININE 2.06*  --  2.63* 2.08* 1.70*  1.62* 1.18* 1.06*  CALCIUM 6.9*  --  7.4* 7.7* 8.3*  8.0* 8.2* 8.1*  MG 1.4* 2.0 2.1 2.1 2.3  --   --   PHOS 4.6  --  4.4 3.2 2.2* 2.5 2.4*   Liver Function Tests: Recent Labs  Lab 04/14/23 0624 04/15/23 0420 04/16/23 0356 04/17/23 0443 04/18/23 0320 04/19/23 0600 04/20/23 0411  AST 131* 909* 530*  --  484*  --   --   ALT 59* 477* 325*  --  461*  --   --   ALKPHOS 45 41 47  --  75  --   --   BILITOT 1.8* 1.0 1.5*  --  2.5*  --   --   PROT 4.0* 3.9* 4.8*  --  5.7*  --   --   ALBUMIN 2.5* 2.4* 2.8* 2.5* 2.9*  2.9* 2.5* 2.8*   No results for input(s): "LIPASE", "AMYLASE" in the last 168 hours. No results for input(s): "AMMONIA" in the last 168 hours. CBC: Recent Labs  Lab 04/14/23 1621 04/15/23 0420 04/15/23 1409 04/16/23 0151 04/16/23 0356 04/16/23 1446 04/16/23 2113 04/17/23 0443 04/18/23 0320  04/19/23 0600 04/20/23 0411  WBC 16.9* 11.9*  --  10.2 10.8*  --   --  9.8 14.0* 12.4* 19.4*  NEUTROABS 14.3* 10.0*  --  8.4* 9.0*  --   --   --   --   --   --   HGB 10.4* 9.5*   < > 9.2* 9.1*   < > 8.7* 8.5* 10.2* 9.1* 9.8*  HCT 29.1* 27.1*   < > 26.8* 26.7*   < > 25.4* 24.9* 30.5* 27.3* 29.6*  MCV 84.3 84.7  --  83.5 83.4  --   --  83.0 85.0 85.6 85.1  PLT 92* 81*  --  65* 65*  --   --  75* 136* 148* 223   < > = values in this interval not displayed.   Cardiac Enzymes: No results for input(s): "CKTOTAL", "CKMB", "CKMBINDEX", "TROPONINI" in the last 168 hours. BNP: Invalid input(s): "POCBNP" CBG: Recent Labs  Lab 04/13/23 2138  GLUCAP 130*   D-Dimer No results for input(s): "DDIMER" in the last 72 hours. Hgb A1c No results for input(s): "HGBA1C" in the last 72 hours. Lipid Profile No results for input(s): "CHOL", "HDL", "LDLCALC", "TRIG", "CHOLHDL", "LDLDIRECT" in the last 72 hours. Thyroid function studies No results for input(s): "TSH", "T4TOTAL", "T3FREE", "THYROIDAB" in the last 72 hours.  Invalid input(s): "FREET3" Anemia work up No results for input(s): "VITAMINB12", "FOLATE", "FERRITIN", "TIBC", "IRON", "RETICCTPCT" in the last 72 hours. Urinalysis    Component Value Date/Time   COLORURINE YELLOW (A) 04/17/2023 0443   APPEARANCEUR CLEAR (A) 04/17/2023 0443   LABSPEC 1.012 04/17/2023 0443   PHURINE 6.0 04/17/2023 0443   GLUCOSEU NEGATIVE 04/17/2023 0443   HGBUR SMALL (A) 04/17/2023 0443   BILIRUBINUR NEGATIVE 04/17/2023 0443   KETONESUR NEGATIVE 04/17/2023 0443   PROTEINUR NEGATIVE 04/17/2023 0443   NITRITE NEGATIVE 04/17/2023 0443   LEUKOCYTESUR TRACE (A) 04/17/2023 0443   Sepsis Labs Recent Labs  Lab 04/17/23 0443 04/18/23 0320 04/19/23 0600 04/20/23 0411  WBC 9.8 14.0* 12.4* 19.4*   Microbiology Recent Results (from the past 240 hour(s))  Body fluid culture w Gram Stain     Status: None   Collection Time: 04/13/23 11:47 AM  Specimen: PATH  Cytology Pleural fluid  Result Value Ref Range Status   Specimen Description   Final    PLEURAL Performed at [facility], [address], [place], [place] [address]    Special Requests   Final    NONE Performed at [facility], [address] [place], [place] [address]    Gram Stain   Final    WBC PRESENT, PREDOMINANTLY MONONUCLEAR NO ORGANISMS SEEN CYTOSPIN SMEAR    Culture   Final    NO GROWTH 3 DAYS Performed at [facility], [address] [place], [place] [address]    Report Status 04/16/2023 FINAL  Final  MRSA Next Gen by PCR, Nasal     Status: None   Collection Time: 04/13/23 11:00 PM   Specimen: Nasal Mucosa; Nasal Swab  Result Value Ref Range Status   MRSA by PCR Next Gen NOT DETECTED NOT DETECTED Final    Comment: (NOTE) The GeneXpert MRSA Assay (FDA approved for NASAL specimens only), is one component of a comprehensive MRSA colonization surveillance program. It is not intended to diagnose MRSA infection nor to guide or monitor treatment for MRSA infections. Test performance is not FDA approved in patients less than 72 years old. Performed at [facility], [address], [place], [place] [address]      Time coordinating discharge: Over 30 minutes  SIGNED:   Charise Killian, MD  [facility] 04/20/2023, 2:27 PM Pager   If 7PM-7AM, please contact night-coverage [email or web address]

## 2023-04-20 NOTE — Plan of Care (Signed)
  Problem: Education: Goal: Understanding of cardiac disease, CV risk reduction, and recovery process will improve Outcome: Adequate for Discharge Goal: Individualized Educational Video(s) Outcome: Adequate for Discharge   Problem: Activity: Goal: Ability to tolerate increased activity will improve Outcome: Adequate for Discharge   Problem: Cardiac: Goal: Ability to achieve and maintain adequate cardiovascular perfusion will improve Outcome: Adequate for Discharge   Problem: Health Behavior/Discharge Planning: Goal: Ability to safely manage health-related needs after discharge will improve Outcome: Adequate for Discharge   Problem: Education: Goal: Understanding of CV disease, CV risk reduction, and recovery process will improve Outcome: Adequate for Discharge Goal: Individualized Educational Video(s) Outcome: Adequate for Discharge   Problem: Activity: Goal: Ability to return to baseline activity level will improve Outcome: Adequate for Discharge   Problem: Cardiovascular: Goal: Ability to achieve and maintain adequate cardiovascular perfusion will improve Outcome: Adequate for Discharge Goal: Vascular access site(s) Level 0-1 will be maintained Outcome: Adequate for Discharge   Problem: Health Behavior/Discharge Planning: Goal: Ability to safely manage health-related needs after discharge will improve Outcome: Adequate for Discharge   Problem: Education: Goal: Knowledge of General Education information will improve Description: Including pain rating scale, medication(s)/side effects and non-pharmacologic comfort measures Outcome: Adequate for Discharge   Problem: Health Behavior/Discharge Planning: Goal: Ability to manage health-related needs will improve Outcome: Adequate for Discharge   Problem: Clinical Measurements: Goal: Ability to maintain clinical measurements within normal limits will improve Outcome: Adequate for Discharge Goal: Will remain free from  infection Outcome: Adequate for Discharge Goal: Diagnostic test results will improve Outcome: Adequate for Discharge Goal: Respiratory complications will improve Outcome: Adequate for Discharge Goal: Cardiovascular complication will be avoided Outcome: Adequate for Discharge   Problem: Activity: Goal: Risk for activity intolerance will decrease Outcome: Adequate for Discharge   Problem: Nutrition: Goal: Adequate nutrition will be maintained Outcome: Adequate for Discharge   Problem: Coping: Goal: Level of anxiety will decrease Outcome: Adequate for Discharge   Problem: Elimination: Goal: Will not experience complications related to bowel motility Outcome: Adequate for Discharge Goal: Will not experience complications related to urinary retention Outcome: Adequate for Discharge   Problem: Pain Management: Goal: General experience of comfort will improve Outcome: Adequate for Discharge   Problem: Safety: Goal: Ability to remain free from injury will improve Outcome: Adequate for Discharge   Problem: Skin Integrity: Goal: Risk for impaired skin integrity will decrease Outcome: Adequate for Discharge

## 2023-04-20 NOTE — Progress Notes (Signed)
[facility] Cardiology    SUBJECTIVE: Patient seems to be doing much better no chest pain no worsening shortness of breath ambulating in the hall is is probably stable for discharge and to follow-up with cardiology 1 to 2 weeks would probably require follow-up with vascular for wound VAC of the right groin area LifeVest to be 1 over the next 90 days until determination for AICD is made   Vitals:   04/19/23 2328 04/20/23 0000 04/20/23 0051 04/20/23 0427  BP: (!) 167/59 (!) 162/68 (!) 172/84 (!) 171/81  Pulse: 96  94 97  Resp:   (!) 22 18  Temp:   97.6 F (36.4 C) 99.9 F (37.7 C)  TempSrc:   Oral Oral  SpO2:   99% 97%  Weight:      Height:         Intake/Output Summary (Last 24 hours) at 04/20/2023 0744 Last data filed at 04/19/2023 1300 Gross per 24 hour  Intake 367 ml  Output --  Net 367 ml      PHYSICAL EXAM  General: Well developed, well nourished, in no acute distress HEENT:  Normocephalic and atramatic Neck:  No JVD.  Lungs: Clear bilaterally to auscultation and percussion. Heart: HRRR . Normal S1 and S2 without gallops or murmurs.  Abdomen: Bowel sounds are positive, abdomen soft and non-tender  Msk:  Back normal, normal gait. Normal strength and tone for age. Extremities: No clubbing, cyanosis or edema.   Neuro: Alert and oriented X 3. Psych:  Good affect, responds appropriately   LABS: Basic Metabolic Panel: Recent Labs    04/18/23 0320 04/19/23 0600 04/20/23 0411  NA 137  137 141 137  K 3.7  3.7 3.5 3.1*  CL 105  104 104 103  CO2 24  25 26 23   GLUCOSE 128*  134* 103* 105*  BUN 32*  32* 28* 22  CREATININE 1.70*  1.62* 1.18* 1.06*  CALCIUM 8.3*  8.0* 8.2* 8.1*  MG 2.3  --   --   PHOS 2.2* 2.5 2.4*   Liver Function Tests: Recent Labs    04/18/23 0320 04/19/23 0600 04/20/23 0411  AST 484*  --   --   ALT 461*  --   --   ALKPHOS 75  --   --   BILITOT 2.5*  --   --   PROT 5.7*  --   --   ALBUMIN 2.9*  2.9* 2.5* 2.8*   No results for  input(s): "LIPASE", "AMYLASE" in the last 72 hours. CBC: Recent Labs    04/19/23 0600 04/20/23 0411  WBC 12.4* 19.4*  HGB 9.1* 9.8*  HCT 27.3* 29.6*  MCV 85.6 85.1  PLT 148* 223   Cardiac Enzymes: No results for input(s): "CKTOTAL", "CKMB", "CKMBINDEX", "TROPONINI" in the last 72 hours. BNP: Invalid input(s): "POCBNP" D-Dimer: No results for input(s): "DDIMER" in the last 72 hours. Hemoglobin A1C: No results for input(s): "HGBA1C" in the last 72 hours. Fasting Lipid Panel: No results for input(s): "CHOL", "HDL", "LDLCALC", "TRIG", "CHOLHDL", "LDLDIRECT" in the last 72 hours. Thyroid Function Tests: No results for input(s): "TSH", "T4TOTAL", "T3FREE", "THYROIDAB" in the last 72 hours.  Invalid input(s): "FREET3" Anemia Panel: No results for input(s): "VITAMINB12", "FOLATE", "FERRITIN", "TIBC", "IRON", "RETICCTPCT" in the last 72 hours.  US Venous Img Lower Unilateral Right (DVT)  Result Date: 04/18/2023 CLINICAL DATA:  Lower extremity edema EXAM: RIGHT LOWER EXTREMITY VENOUS DOPPLER ULTRASOUND TECHNIQUE: Gray-scale sonography with compression, as well as color and duplex ultrasound, were performed to  evaluate the deep venous system(s) from the level of the common femoral vein through the popliteal and proximal calf veins. COMPARISON:  None Available. FINDINGS: VENOUS The common femoral vein and central profundus femoral vein is obscured by overlying medical device. Normal compressibility of the visualized femoral and popliteal veins, as well as the visualized calf veins. No filling defects to suggest DVT on grayscale or color Doppler imaging. Doppler waveforms show normal direction of venous flow, normal respiratory plasticity and response to augmentation. Limited views of the contralateral common femoral vein are unremarkable. OTHER 4.4 cm fluid collection within the popliteal fossa noted in keeping with a Baker cyst. Limitations: none IMPRESSION: 1. Limited evaluation of the right  common femoral and central profundus femoral veins. No evidence of femoropopliteal DVT or calf DVT within the visualized right lower extremity. 2. 4.4 cm Baker cyst. Electronically Signed   By: Helyn Numbers M.D.   On: 04/18/2023 20:55     Echo moderate to severely depressed LV function EF 25 to 30%  TELEMETRY: Atrial fibrillation rate of around 90 nonspecific ST-T wave changes:  ASSESSMENT AND PLAN:  Principal Problem:   Chest pain Active Problems:   CKD (chronic kidney disease) stage 3, GFR 30-59 ml/min (HCC)   Essential hypertension   Chronic diastolic heart failure (HCC)   NSTEMI (non-ST elevated myocardial infarction) (HCC)   Tobacco abuse    Plan Non-STEMI status post PCI and stent complicated by retroperitoneal hematoma required surgical correction Wound VAC in place status postsurgical repair femoral and iliac artery follow-up with vascular Status post DES stent to the LAD continue anticoagulation therapy Plavix Eliquis aspirin Atrial fibrillation maintain Eliquis metoprolol therapy Continue hypertension management and control Advised patient refrain from tobacco abuse Ischemic cardiomyopathy EF of around 30% recommend LifeVest for now consider whether AICD placement is warranted after about 90 days   Alwyn Pea, MD 04/20/2023 7:44 AM

## 2023-04-20 NOTE — Progress Notes (Signed)
Patient and Husband educated on AVS at this time.  Husband left to change cars and will call when he is at front entrance.  Attempted to help patient with her LifeVest and she is refusing to put on before discharge.  Stated she wanted to go home and get settled and take a shower first said she would wear it tomorrow.  Attempted to educate on the importance of the vest and that it is to save her life if she were to go into [place].  Still refusing to put on at this time.  All belongings packed and patient was assisted to get dressed awaiting Husband arrival.

## 2023-04-21 ENCOUNTER — Other Ambulatory Visit: Payer: Self-pay | Admitting: Surgery

## 2023-04-21 MED ORDER — CEPHALEXIN 500 MG PO CAPS: 500.0000 mg | ORAL_CAPSULE | Freq: Three times a day (TID) | ORAL | 0 refills | Status: DC

## 2023-04-21 NOTE — Progress Notes (Signed)
Patient called with concerns over right groin drainage and foul smell.  Her wound yesterday looked pretty good,soI called her in keflex and told her to shower and wash the wound.  She is going to come to the office on Monday for a groin check.  If she feels that it is getting worse, she will call me back and I will get her to come to the ER for it to be evaluated   Durene Cal

## 2023-04-22 ENCOUNTER — Emergency Department: Payer: Medicare Other

## 2023-04-22 ENCOUNTER — Encounter: Payer: Self-pay | Admitting: Emergency Medicine

## 2023-04-22 ENCOUNTER — Inpatient Hospital Stay
Admission: EM | Admit: 2023-04-22 | Discharge: 2023-05-13 | DRG: 856 | Disposition: A | Discharge status: Home Health | Payer: Medicare Other | Attending: Internal Medicine | Admitting: Internal Medicine

## 2023-04-22 ENCOUNTER — Other Ambulatory Visit: Payer: Self-pay

## 2023-04-22 DIAGNOSIS — Z87891 Personal history of nicotine dependence: Secondary | ICD-10-CM

## 2023-04-22 DIAGNOSIS — T148XXA Other injury of unspecified body region, initial encounter: Secondary | ICD-10-CM

## 2023-04-22 DIAGNOSIS — I482 Chronic atrial fibrillation, unspecified: Secondary | ICD-10-CM | POA: Diagnosis not present

## 2023-04-22 DIAGNOSIS — Z48813 Encounter for surgical aftercare following surgery on the respiratory system: Secondary | ICD-10-CM | POA: Diagnosis not present

## 2023-04-22 DIAGNOSIS — E440 Moderate protein-calorie malnutrition: Secondary | ICD-10-CM | POA: Diagnosis present

## 2023-04-22 DIAGNOSIS — R7303 Prediabetes: Secondary | ICD-10-CM | POA: Diagnosis present

## 2023-04-22 DIAGNOSIS — N1831 Chronic kidney disease, stage 3a: Secondary | ICD-10-CM | POA: Diagnosis present

## 2023-04-22 DIAGNOSIS — Z955 Presence of coronary angioplasty implant and graft: Secondary | ICD-10-CM

## 2023-04-22 DIAGNOSIS — G894 Chronic pain syndrome: Secondary | ICD-10-CM | POA: Diagnosis present

## 2023-04-22 DIAGNOSIS — N179 Acute kidney failure, unspecified: Secondary | ICD-10-CM | POA: Insufficient documentation

## 2023-04-22 DIAGNOSIS — T8142XA Infection following a procedure, deep incisional surgical site, initial encounter: Secondary | ICD-10-CM | POA: Diagnosis not present

## 2023-04-22 DIAGNOSIS — Z79899 Other long term (current) drug therapy: Secondary | ICD-10-CM

## 2023-04-22 DIAGNOSIS — I96 Gangrene, not elsewhere classified: Secondary | ICD-10-CM | POA: Diagnosis present

## 2023-04-22 DIAGNOSIS — R6520 Severe sepsis without septic shock: Secondary | ICD-10-CM | POA: Diagnosis not present

## 2023-04-22 DIAGNOSIS — I129 Hypertensive chronic kidney disease with stage 1 through stage 4 chronic kidney disease, or unspecified chronic kidney disease: Secondary | ICD-10-CM | POA: Diagnosis not present

## 2023-04-22 DIAGNOSIS — J9811 Atelectasis: Secondary | ICD-10-CM | POA: Diagnosis not present

## 2023-04-22 DIAGNOSIS — E876 Hypokalemia: Secondary | ICD-10-CM | POA: Diagnosis present

## 2023-04-22 DIAGNOSIS — T360X5A Adverse effect of penicillins, initial encounter: Secondary | ICD-10-CM | POA: Diagnosis not present

## 2023-04-22 DIAGNOSIS — D696 Thrombocytopenia, unspecified: Secondary | ICD-10-CM | POA: Diagnosis not present

## 2023-04-22 DIAGNOSIS — I509 Heart failure, unspecified: Secondary | ICD-10-CM | POA: Diagnosis not present

## 2023-04-22 DIAGNOSIS — Z85828 Personal history of other malignant neoplasm of skin: Secondary | ICD-10-CM

## 2023-04-22 DIAGNOSIS — R0689 Other abnormalities of breathing: Secondary | ICD-10-CM | POA: Diagnosis not present

## 2023-04-22 DIAGNOSIS — D6959 Other secondary thrombocytopenia: Secondary | ICD-10-CM | POA: Diagnosis present

## 2023-04-22 DIAGNOSIS — A419 Sepsis, unspecified organism: Secondary | ICD-10-CM | POA: Diagnosis present

## 2023-04-22 DIAGNOSIS — L089 Local infection of the skin and subcutaneous tissue, unspecified: Secondary | ICD-10-CM | POA: Diagnosis not present

## 2023-04-22 DIAGNOSIS — D631 Anemia in chronic kidney disease: Secondary | ICD-10-CM | POA: Diagnosis present

## 2023-04-22 DIAGNOSIS — I700 Atherosclerosis of aorta: Secondary | ICD-10-CM | POA: Diagnosis not present

## 2023-04-22 DIAGNOSIS — I728 Aneurysm of other specified arteries: Secondary | ICD-10-CM | POA: Diagnosis not present

## 2023-04-22 DIAGNOSIS — Z7902 Long term (current) use of antithrombotics/antiplatelets: Secondary | ICD-10-CM

## 2023-04-22 DIAGNOSIS — A4189 Other specified sepsis: Secondary | ICD-10-CM | POA: Diagnosis not present

## 2023-04-22 DIAGNOSIS — I5043 Acute on chronic combined systolic (congestive) and diastolic (congestive) heart failure: Secondary | ICD-10-CM | POA: Diagnosis not present

## 2023-04-22 DIAGNOSIS — I214 Non-ST elevation (NSTEMI) myocardial infarction: Secondary | ICD-10-CM | POA: Diagnosis not present

## 2023-04-22 DIAGNOSIS — T8189XA Other complications of procedures, not elsewhere classified, initial encounter: Secondary | ICD-10-CM | POA: Diagnosis not present

## 2023-04-22 DIAGNOSIS — R000 Tachycardia, unspecified: Secondary | ICD-10-CM | POA: Diagnosis not present

## 2023-04-22 DIAGNOSIS — R918 Other nonspecific abnormal finding of lung field: Secondary | ICD-10-CM | POA: Diagnosis not present

## 2023-04-22 DIAGNOSIS — R7989 Other specified abnormal findings of blood chemistry: Secondary | ICD-10-CM | POA: Diagnosis present

## 2023-04-22 DIAGNOSIS — T8131XA Disruption of external operation (surgical) wound, not elsewhere classified, initial encounter: Secondary | ICD-10-CM | POA: Diagnosis not present

## 2023-04-22 DIAGNOSIS — L03314 Cellulitis of groin: Secondary | ICD-10-CM | POA: Diagnosis present

## 2023-04-22 DIAGNOSIS — N1832 Chronic kidney disease, stage 3b: Secondary | ICD-10-CM | POA: Diagnosis not present

## 2023-04-22 DIAGNOSIS — J90 Pleural effusion, not elsewhere classified: Secondary | ICD-10-CM | POA: Diagnosis not present

## 2023-04-22 DIAGNOSIS — K219 Gastro-esophageal reflux disease without esophagitis: Secondary | ICD-10-CM | POA: Diagnosis present

## 2023-04-22 DIAGNOSIS — I5020 Unspecified systolic (congestive) heart failure: Secondary | ICD-10-CM

## 2023-04-22 DIAGNOSIS — E785 Hyperlipidemia, unspecified: Secondary | ICD-10-CM | POA: Diagnosis present

## 2023-04-22 DIAGNOSIS — Y838 Other surgical procedures as the cause of abnormal reaction of the patient, or of later complication, without mention of misadventure at the time of the procedure: Secondary | ICD-10-CM | POA: Diagnosis present

## 2023-04-22 DIAGNOSIS — Z7901 Long term (current) use of anticoagulants: Secondary | ICD-10-CM

## 2023-04-22 DIAGNOSIS — S31103A Unspecified open wound of abdominal wall, right lower quadrant without penetration into peritoneal cavity, initial encounter: Secondary | ICD-10-CM | POA: Diagnosis not present

## 2023-04-22 DIAGNOSIS — J984 Other disorders of lung: Secondary | ICD-10-CM | POA: Diagnosis not present

## 2023-04-22 DIAGNOSIS — T8130XA Disruption of wound, unspecified, initial encounter: Secondary | ICD-10-CM | POA: Diagnosis present

## 2023-04-22 DIAGNOSIS — K661 Hemoperitoneum: Secondary | ICD-10-CM | POA: Diagnosis present

## 2023-04-22 DIAGNOSIS — R197 Diarrhea, unspecified: Secondary | ICD-10-CM | POA: Diagnosis present

## 2023-04-22 DIAGNOSIS — I10 Essential (primary) hypertension: Secondary | ICD-10-CM | POA: Diagnosis present

## 2023-04-22 DIAGNOSIS — K659 Peritonitis, unspecified: Secondary | ICD-10-CM | POA: Diagnosis not present

## 2023-04-22 DIAGNOSIS — B9622 Other specified Shiga toxin-producing Escherichia coli [E. coli] (STEC) as the cause of diseases classified elsewhere: Secondary | ICD-10-CM | POA: Diagnosis not present

## 2023-04-22 DIAGNOSIS — L03311 Cellulitis of abdominal wall: Secondary | ICD-10-CM | POA: Diagnosis not present

## 2023-04-22 DIAGNOSIS — J811 Chronic pulmonary edema: Secondary | ICD-10-CM | POA: Diagnosis not present

## 2023-04-22 DIAGNOSIS — K683 Retroperitoneal hematoma: Secondary | ICD-10-CM | POA: Diagnosis present

## 2023-04-22 DIAGNOSIS — I2510 Atherosclerotic heart disease of native coronary artery without angina pectoris: Secondary | ICD-10-CM | POA: Diagnosis present

## 2023-04-22 DIAGNOSIS — L03115 Cellulitis of right lower limb: Secondary | ICD-10-CM | POA: Diagnosis not present

## 2023-04-22 DIAGNOSIS — I5032 Chronic diastolic (congestive) heart failure: Secondary | ICD-10-CM | POA: Diagnosis present

## 2023-04-22 DIAGNOSIS — Z95820 Peripheral vascular angioplasty status with implants and grafts: Secondary | ICD-10-CM | POA: Diagnosis not present

## 2023-04-22 DIAGNOSIS — F419 Anxiety disorder, unspecified: Secondary | ICD-10-CM | POA: Diagnosis present

## 2023-04-22 DIAGNOSIS — R1031 Right lower quadrant pain: Secondary | ICD-10-CM | POA: Diagnosis not present

## 2023-04-22 DIAGNOSIS — Z86008 Personal history of in-situ neoplasm of other site: Secondary | ICD-10-CM

## 2023-04-22 DIAGNOSIS — I959 Hypotension, unspecified: Secondary | ICD-10-CM | POA: Diagnosis not present

## 2023-04-22 DIAGNOSIS — S31109A Unspecified open wound of abdominal wall, unspecified quadrant without penetration into peritoneal cavity, initial encounter: Secondary | ICD-10-CM | POA: Diagnosis not present

## 2023-04-22 DIAGNOSIS — E663 Overweight: Secondary | ICD-10-CM | POA: Diagnosis present

## 2023-04-22 DIAGNOSIS — Z90710 Acquired absence of both cervix and uterus: Secondary | ICD-10-CM | POA: Diagnosis not present

## 2023-04-22 DIAGNOSIS — Z9889 Other specified postprocedural states: Secondary | ICD-10-CM

## 2023-04-22 DIAGNOSIS — T8141XA Infection following a procedure, superficial incisional surgical site, initial encounter: Secondary | ICD-10-CM | POA: Diagnosis not present

## 2023-04-22 DIAGNOSIS — E872 Acidosis, unspecified: Secondary | ICD-10-CM | POA: Insufficient documentation

## 2023-04-22 DIAGNOSIS — Z7982 Long term (current) use of aspirin: Secondary | ICD-10-CM

## 2023-04-22 DIAGNOSIS — I130 Hypertensive heart and chronic kidney disease with heart failure and stage 1 through stage 4 chronic kidney disease, or unspecified chronic kidney disease: Secondary | ICD-10-CM | POA: Diagnosis present

## 2023-04-22 DIAGNOSIS — I4891 Unspecified atrial fibrillation: Secondary | ICD-10-CM | POA: Diagnosis not present

## 2023-04-22 DIAGNOSIS — T8149XA Infection following a procedure, other surgical site, initial encounter: Secondary | ICD-10-CM | POA: Diagnosis not present

## 2023-04-22 DIAGNOSIS — B952 Enterococcus as the cause of diseases classified elsewhere: Secondary | ICD-10-CM | POA: Diagnosis not present

## 2023-04-22 DIAGNOSIS — R0602 Shortness of breath: Secondary | ICD-10-CM | POA: Diagnosis not present

## 2023-04-22 DIAGNOSIS — Z6825 Body mass index (BMI) 25.0-25.9, adult: Secondary | ICD-10-CM

## 2023-04-22 DIAGNOSIS — S3092XA Unspecified superficial injury of abdominal wall, initial encounter: Secondary | ICD-10-CM | POA: Diagnosis not present

## 2023-04-22 DIAGNOSIS — Z452 Encounter for adjustment and management of vascular access device: Secondary | ICD-10-CM | POA: Diagnosis not present

## 2023-04-22 DIAGNOSIS — R0989 Other specified symptoms and signs involving the circulatory and respiratory systems: Secondary | ICD-10-CM | POA: Diagnosis not present

## 2023-04-22 DIAGNOSIS — I97630 Postprocedural hematoma of a circulatory system organ or structure following a cardiac catheterization: Secondary | ICD-10-CM | POA: Diagnosis not present

## 2023-04-22 LAB — COMPREHENSIVE METABOLIC PANEL
ALT: 83 U/L — ABNORMAL HIGH (ref 0–44)
AST: 54 U/L — ABNORMAL HIGH (ref 15–41)
Albumin: 2.6 g/dL — ABNORMAL LOW (ref 3.5–5.0)
Alkaline Phosphatase: 88 U/L (ref 38–126)
Anion gap: 6 (ref 5–15)
BUN: 30 mg/dL — ABNORMAL HIGH (ref 8–23)
CO2: 27 mmol/L (ref 22–32)
Calcium: 8 mg/dL — ABNORMAL LOW (ref 8.9–10.3)
Chloride: 104 mmol/L (ref 98–111)
Creatinine, Ser: 1.17 mg/dL — ABNORMAL HIGH (ref 0.44–1.00)
GFR, Estimated: 47 mL/min — ABNORMAL LOW (ref >60)
Glucose, Bld: 142 mg/dL — ABNORMAL HIGH (ref 70–99)
Potassium: 3.3 mmol/L — ABNORMAL LOW (ref 3.5–5.1)
Sodium: 137 mmol/L (ref 135–145)
Total Bilirubin: 2.8 mg/dL — ABNORMAL HIGH (ref <1.2)
Total Protein: 5.9 g/dL — ABNORMAL LOW (ref 6.5–8.1)

## 2023-04-22 LAB — PROTIME-INR
INR: 1.5 — ABNORMAL HIGH (ref 0.8–1.2)
Prothrombin Time: 18.2 s — ABNORMAL HIGH (ref 11.4–15.2)

## 2023-04-22 LAB — CBC WITH DIFFERENTIAL/PLATELET
Abs Immature Granulocytes: 0.25 10*3/uL — ABNORMAL HIGH (ref 0.00–0.07)
Basophils Absolute: 0 10*3/uL (ref 0.0–0.1)
Basophils Relative: 0 %
Eosinophils Absolute: 0.2 10*3/uL (ref 0.0–0.5)
Eosinophils Relative: 1 %
HCT: 32.4 % — ABNORMAL LOW (ref 36.0–46.0)
Hemoglobin: 10.3 g/dL — ABNORMAL LOW (ref 12.0–15.0)
Immature Granulocytes: 1 %
Lymphocytes Relative: 4 %
Lymphs Abs: 0.7 10*3/uL (ref 0.7–4.0)
MCH: 28.1 pg (ref 26.0–34.0)
MCHC: 31.8 g/dL (ref 30.0–36.0)
MCV: 88.3 fL (ref 80.0–100.0)
Monocytes Absolute: 1.2 10*3/uL — ABNORMAL HIGH (ref 0.1–1.0)
Monocytes Relative: 6 %
Neutro Abs: 18.9 10*3/uL — ABNORMAL HIGH (ref 1.7–7.7)
Neutrophils Relative %: 88 %
Platelets: 365 10*3/uL (ref 150–400)
RBC: 3.67 MIL/uL — ABNORMAL LOW (ref 3.87–5.11)
RDW: 17.3 % — ABNORMAL HIGH (ref 11.5–15.5)
WBC: 21.3 10*3/uL — ABNORMAL HIGH (ref 4.0–10.5)
nRBC: 0 % (ref 0.0–0.2)

## 2023-04-22 LAB — URINALYSIS, W/ REFLEX TO CULTURE (INFECTION SUSPECTED)
Bilirubin Urine: NEGATIVE
Glucose, UA: NEGATIVE mg/dL
Ketones, ur: NEGATIVE mg/dL
Leukocytes,Ua: NEGATIVE
Nitrite: NEGATIVE
Protein, ur: NEGATIVE mg/dL
Specific Gravity, Urine: 1.043 — ABNORMAL HIGH (ref 1.005–1.030)
pH: 6 (ref 5.0–8.0)

## 2023-04-22 LAB — TYPE AND SCREEN
ABO/RH(D): O POS
Antibody Screen: NEGATIVE

## 2023-04-22 LAB — HEPARIN LEVEL (UNFRACTIONATED): Heparin Unfractionated: >1.1 [IU]/mL — ABNORMAL HIGH (ref 0.30–0.70)

## 2023-04-22 LAB — C DIFFICILE QUICK SCREEN W PCR REFLEX
C Diff antigen: NEGATIVE
C Diff interpretation: NOT DETECTED
C Diff toxin: NEGATIVE

## 2023-04-22 LAB — LACTIC ACID, PLASMA: Lactic Acid, Venous: 1.7 mmol/L (ref 0.5–1.9)

## 2023-04-22 LAB — PHOSPHORUS: Phosphorus: 2.7 mg/dL (ref 2.5–4.6)

## 2023-04-22 LAB — APTT: aPTT: 43 s — ABNORMAL HIGH (ref 24–36)

## 2023-04-22 LAB — BRAIN NATRIURETIC PEPTIDE: B Natriuretic Peptide: 626.3 pg/mL — ABNORMAL HIGH (ref 0.0–100.0)

## 2023-04-22 LAB — SEDIMENTATION RATE: Sed Rate: 60 mm/h — ABNORMAL HIGH (ref 0–30)

## 2023-04-22 LAB — MAGNESIUM: Magnesium: 2.3 mg/dL (ref 1.7–2.4)

## 2023-04-22 MED ORDER — DIPHENHYDRAMINE HCL 50 MG/ML IJ SOLN: 12.5000 mg | Freq: Three times a day (TID) | INTRAMUSCULAR | Status: DC | PRN

## 2023-04-22 MED ORDER — IOHEXOL 300 MG/ML  SOLN
75.0000 mL | Freq: Once | INTRAMUSCULAR | Status: AC | PRN
  Administered 2023-04-22: 75 mL via INTRAVENOUS

## 2023-04-22 MED ORDER — LOPERAMIDE HCL 2 MG PO CAPS: 2.0000 mg | ORAL_CAPSULE | Freq: Two times a day (BID) | ORAL | Status: DC | PRN

## 2023-04-22 MED ORDER — POTASSIUM CHLORIDE CRYS ER 20 MEQ PO TBCR
40.0000 meq | EXTENDED_RELEASE_TABLET | Freq: Once | ORAL | Status: AC
  Administered 2023-04-22: 40 meq via ORAL
  Filled 2023-04-22: qty 2

## 2023-04-22 MED ORDER — SODIUM CHLORIDE 0.9 % IV SOLN: 2.0000 g | INTRAVENOUS | Status: DC

## 2023-04-22 MED ORDER — VANCOMYCIN HCL 500 MG/100ML IV SOLN
500.0000 mg | INTRAVENOUS | Status: DC
  Filled 2023-04-22: qty 100

## 2023-04-22 MED ORDER — VANCOMYCIN HCL IN DEXTROSE 1-5 GM/200ML-% IV SOLN
1000.0000 mg | Freq: Once | INTRAVENOUS | Status: AC
  Administered 2023-04-22: 1000 mg via INTRAVENOUS
  Filled 2023-04-22: qty 200

## 2023-04-22 MED ORDER — SODIUM CHLORIDE 0.9 % IV SOLN
2.0000 g | Freq: Once | INTRAVENOUS | Status: AC
  Administered 2023-04-22: 2 g via INTRAVENOUS
  Filled 2023-04-22: qty 20

## 2023-04-22 MED ORDER — TRAZODONE HCL 50 MG PO TABS
25.0000 mg | ORAL_TABLET | Freq: Every evening | ORAL | Status: DC | PRN
  Administered 2023-04-22 – 2023-04-29 (×8): 50 mg via ORAL
  Filled 2023-04-22 (×8): qty 1

## 2023-04-22 MED ORDER — OXYCODONE HCL 5 MG PO TABS
5.0000 mg | ORAL_TABLET | Freq: Four times a day (QID) | ORAL | Status: DC | PRN
  Administered 2023-04-24 – 2023-05-04 (×23): 5 mg via ORAL
  Filled 2023-04-22 (×25): qty 1

## 2023-04-22 MED ORDER — SODIUM CHLORIDE 0.9 % IV BOLUS (SEPSIS)
1000.0000 mL | Freq: Once | INTRAVENOUS | Status: AC
  Administered 2023-04-22: 1000 mL via INTRAVENOUS

## 2023-04-22 MED ORDER — HYDRALAZINE HCL 20 MG/ML IJ SOLN: 5.0000 mg | INTRAMUSCULAR | Status: DC | PRN

## 2023-04-22 MED ORDER — PREGABALIN 75 MG PO CAPS
150.0000 mg | ORAL_CAPSULE | Freq: Two times a day (BID) | ORAL | Status: DC
  Administered 2023-04-22 – 2023-05-02 (×20): 150 mg via ORAL
  Filled 2023-04-22 (×2): qty 2
  Filled 2023-04-22: qty 3
  Filled 2023-04-22 (×4): qty 2
  Filled 2023-04-22: qty 3
  Filled 2023-04-22 (×12): qty 2

## 2023-04-22 MED ORDER — IBUPROFEN 400 MG PO TABS
200.0000 mg | ORAL_TABLET | Freq: Four times a day (QID) | ORAL | Status: DC | PRN
  Administered 2023-04-22: 200 mg via ORAL
  Filled 2023-04-22: qty 1

## 2023-04-22 MED ORDER — HEPARIN (PORCINE) 25000 UT/250ML-% IV SOLN
900.0000 [IU]/h | INTRAVENOUS | Status: DC
  Administered 2023-04-22: 750 [IU]/h via INTRAVENOUS
  Filled 2023-04-22: qty 250

## 2023-04-22 MED ORDER — SODIUM CHLORIDE 0.9 % IV SOLN: 1.0000 g | INTRAVENOUS | Status: DC

## 2023-04-22 NOTE — Progress Notes (Signed)
PHARMACY - ANTICOAGULATION CONSULT NOTE  Pharmacy Consult for Heparin Infusion Indication: atrial fibrillation  No Known Allergies  Patient Measurements:   Heparin Dosing Weight: 54.4 kg  Vital Signs: Temp: 99.7 F (37.6 C) (12/01 2026) Temp Source: Oral (12/01 2026) BP: 163/74 (12/01 2026) Pulse Rate: 88 (12/01 2026)  Labs: Recent Labs    04/20/23 0411 04/22/23 1528 04/22/23 1540  HGB 9.8* 10.3*  --   HCT 29.6* 32.4*  --   PLT 223 365  --   APTT  --   --  43*  LABPROT  --   --  18.2*  INR  --   --  1.5*  CREATININE 1.06* 1.17*  --     Estimated Creatinine Clearance: 32.3 mL/min (A) (by C-G formula based on SCr of 1.17 mg/dL (H)).   Medical History: Past Medical History:  Diagnosis Date   Actinic keratosis    Chronic pain    GERD (gastroesophageal reflux disease) ?   Taking Pepcid   History of basal cell carcinoma (BCC) 05/02/2021   right upper back paraspinal   History of SCC (squamous cell carcinoma) of skin 09/10/2019   left dorsum wrist ED&C done 10/28/19   History of SCC (squamous cell carcinoma) of skin 03/04/2020   right dorsum hand   History of squamous cell carcinoma in situ (SCCIS) 03/04/2020   left dorsum wrist  ED&C   History of squamous cell carcinoma in situ (SCCIS) 03/04/2020   right medial infraorbital  ED&C 04/28/2020   History of squamous cell carcinoma in situ (SCCIS) 10/28/2019   right dorsum hand proximal lateral   History of squamous cell carcinoma in situ (SCCIS) 10/28/2019   right dorsum hand proximal medial   Hyperlipidemia    Hypertension    NSTEMI (non-ST elevated myocardial infarction) (HCC) 04/12/2023   Renal disorder    Squamous cell carcinoma in situ 10/31/2021   left distal  tricep, EDC   Squamous cell carcinoma of skin 08/28/2022   Right volar forearm - EDC    Assessment: Patient is a 77 year old female with a past medical history of hypertension, recent NSTEMI with left heart cath on 04/13/2023 complicated by iliac  artery laceration with retroperitoneal hematoma requiring exploration and repair by vascular surgery who was discharged home 2 days ago. Now presents to hospital with redness, swelling, and increased pain at her right groin incision. Team wants to transition patient from her apixaban to IV heparin for possible surgical debridement. Last dose of apixaban unknown but at minimum prior to 1500 when she was admitted.  Baseline INR 1.5 and aPTT 43. Baseline heparin level ordered. Hgb 10.3. PLT 365.  No signs/symptoms of bleeding noted.  Goal of Therapy:  Heparin level 0.3-0.7 units/ml aPTT 66-102 seconds Monitor platelets by anticoagulation protocol: Yes   Plan:  - No bolus due to recent apixaban use - Start heparin infusion at a rate of 750 units/hr - Check aPTT level in 8 hours - Monitor CBC and heparin level daily - Continue to monitor via aPTT levels until heparin level correlates  Merryl Hacker, PharmD Clinical Pharmacist 04/22/2023,10:07 PM

## 2023-04-22 NOTE — ED Notes (Signed)
Handoff given to Megan, RN.

## 2023-04-22 NOTE — Progress Notes (Signed)
ED Pharmacy Antibiotic Sign Off An antibiotic consult was received from an ED provider for vancomycin per pharmacy dosing for sepsis. A chart review was completed to assess appropriateness.   The following one time order(s) were placed:  Vancomycin 1 g IV x 1  Further antibiotic and/or antibiotic pharmacy consults should be ordered by the admitting provider if indicated.   Thank you for allowing pharmacy to be a part of this patient's care.   Merryl Hacker, [facility]  Clinical Pharmacist 04/22/23 3:42 PM

## 2023-04-22 NOTE — ED Notes (Signed)
Informed EDP Scotty [address] that pt having diarrhea, requesting anti diarrheal.

## 2023-04-22 NOTE — ED Notes (Signed)
Pt not allowing this RN to get second IV. Pt raised voice at this RN. 1 set of cultures was sent with first u/s IV. L arm is restricted. 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   Starting abx at this time with only 1 set of cultures sent.

## 2023-04-22 NOTE — ED Notes (Signed)
Dr Niu at bedside 

## 2023-04-22 NOTE — Progress Notes (Signed)
Pharmacy Antibiotic Note  Dawn Bruce is a 77 y.o. female admitted on 04/22/2023 with cellulitis. history of hypertension, recent NSTEMI with left heart cath on 04/13/2023 complicated by iliac artery laceration with retroperitoneal hematoma requiring exploration and repair by vascular surgery who was discharged home 2 days ago, yesterday started noticing redness swelling increased pain and fell shoulder at her right groin incision. Vascular surgery called in Keflex for her, she took 1 dose yesterday. This morning, she felt very weak and lightheaded, called EMS who reported an initial blood pressure of 80/50 on first check but which normalized afterward prior to IV fluids. Patient reports she continues to feel unwell  Pharmacy has been consulted for vancomycin dosing.  Patient already given: Vancomycin 1 g IV x 1, Ceftriaxone 2 g IV x 1 Tmax 99.7 WBC 21.3 Lactic acid 1.7  Plan: Day 1 of antibiotics Start vancomycin 500 mg IV Q24H. Goal AUC 400-550. Expected AUC: 409.6 Expected Css min: 11.7 SCr used: 1.17  Weight used: IBW, Vd used: 0.72 (BMI 21.1) Continue to monitor renal function and follow culture results      Temp (24hrs), Avg:98.4 F (36.9 C), Min:98.4 F (36.9 C), Max:98.4 F (36.9 C)  Recent Labs  Lab 04/17/23 0443 04/18/23 0320 04/19/23 0600 04/20/23 0411 04/22/23 1528  WBC 9.8 14.0* 12.4* 19.4* 21.3*  CREATININE 2.08* 1.70*  1.62* 1.18* 1.06* 1.17*  LATICACIDVEN  --   --   --   --  1.7    Estimated Creatinine Clearance: 32.3 mL/min (A) (by C-G formula based on SCr of 1.17 mg/dL (H)).    No Known Allergies  Antimicrobials this admission: 12/1 Vanc >>  12/1 Ceftriaxone x 1  Dose adjustments this admission: None  Microbiology results: 12/1 BCx: IP   Thank you for allowing pharmacy to be a part of this patient's care.  Merryl Hacker, PharmD Clinical Pharmacist 04/22/2023 8:25 PM

## 2023-04-22 NOTE — Progress Notes (Signed)
[facility]link following for sepsis protocol. 

## 2023-04-22 NOTE — H&P (Incomplete)
History and Physical    Dawn Bruce UKG:[id number] DOB: 10-02-1945 DOA: 04/22/2023  Referring MD/NP/PA:   PCP: Erasmo Downer, MD   Patient coming from:  The patient is coming from home.     Chief Complaint: Right groin pain  HPI: Dawn Bruce is a 77 y.o. female with medical history significant of dCHF, CAD (s/p of recent DES), A fib on Eliquis, HTN, HLD, CDK-3a, chronic pain syndrome, multiple skin cancer, perforated appendicitis, former smoker, who presents with right groin pain.  Patient was recently hospitalized from 11/21 - 11/29 due to non-STEMI.  Patient is s/p of LHC with DES placement. Pt retroperitoneal hematoma & hemorrhagic shock due to complication of Iliac artery laceration during left heart cath. Pt underwent surgical repair of external iliac artery by [facility] on 11/23.   Yesterday she started noticing redness, swelling and increased pain at her right groin incision area.  Vascular surgery called in Keflex for her, she took 1 dose yesterday.  Today, she has been feeling weak and lightheaded.  Per EMS report, patient had low blood pressure of 80/50 on first check but which normalized afterward prior to IV fluids.  Patient has mild shortness of breath, mild dry cough, denies chest pain, fever or chills.  Patient has diarrhea in the past several days with 10-12 times of loose stool bowel movement each day, no nausean vomiting or abdominal pain.  Denies symptoms of UTI.   Data reviewed independently and ED Course: pt was found to have WBC 21.3, BNP 626, stable renal function, potassium 3.3, Mg 2.3, phosphorus 2.7, lactic acid of 1.7, C diff negative. Temperature normal, blood pressure 141/74, heart rate 104, RR 29, oxygen saturation 98% on room air.  Patient is admitted to telemetry bed as inpatient.  Dr. Myra Gianotti of [facility] is consulted by EDP.  CXR: Interval development of moderate size left pleural effusion with probable underlying atelectasis or  infiltrate.  CT-abd/pelvis: Moderate sized right retroperitoneal hematoma is noted which extends into right pelvic region and displaces urinary bladder to the left. The pelvic component appears to be mildly enlarged compared to prior exam.   Bilateral pleural effusions are noted, left greater than right, with associated atelectasis of the lower lobes.    EKG: I have personally reviewed.  QTc 532, T wave inversion in inferior leads and V2-V6   Review of Systems:   General: no fevers, chills, no body weight gain, has fatigue HEENT: no blurry vision, hearing changes or sore throat Respiratory: has mild dyspnea, coughing, no wheezing CV: no chest pain, no palpitations GI: no nausea, vomiting, abdominal pain, has diarrhea, no constipation GU: no dysuria, burning on urination, increased urinary frequency, hematuria  Ext: has trace leg edema Neuro: no unilateral weakness, numbness, or tingling, no vision change or hearing loss Skin: has pain, redness in right groin area MSK: No muscle spasm, no deformity, no limitation of range of movement in spin Heme: No easy bruising.  Travel history: No recent long distant travel.   Allergy: No Known Allergies  Past Medical History:  Diagnosis Date   Actinic keratosis    Chronic pain    GERD (gastroesophageal reflux disease) ?   Taking Pepcid   History of basal cell carcinoma (BCC) 05/02/2021   right upper back paraspinal   History of SCC (squamous cell carcinoma) of skin 09/10/2019   left dorsum wrist ED&C done 10/28/19   History of SCC (squamous cell carcinoma) of skin 03/04/2020   right dorsum hand   History of squamous  cell carcinoma in situ (SCCIS) 03/04/2020   left dorsum wrist  ED&C   History of squamous cell carcinoma in situ (SCCIS) 03/04/2020   right medial infraorbital  ED&C 04/28/2020   History of squamous cell carcinoma in situ (SCCIS) 10/28/2019   right dorsum hand proximal lateral   History of squamous cell carcinoma in  situ (SCCIS) 10/28/2019   right dorsum hand proximal medial   Hyperlipidemia    Hypertension    NSTEMI (non-ST elevated myocardial infarction) (HCC) 04/12/2023   Renal disorder    Squamous cell carcinoma in situ 10/31/2021   left distal  tricep, EDC   Squamous cell carcinoma of skin 08/28/2022   Right volar forearm - EDC    Past Surgical History:  Procedure Laterality Date   ABDOMINAL HYSTERECTOMY     APPENDECTOMY  April 2023   Burst appendix   AUGMENTATION MAMMAPLASTY Bilateral    25-30 years ago   CARDIOVERSION N/A 12/01/2020   Procedure: CARDIOVERSION;  Surgeon: Debbe Odea, MD;  Location: [facility];  Service: Cardiovascular;  Laterality: N/A;   CAROTID ARTERY ANGIOPLASTY Right 1995(approximate)   CAROTID ENDARTERECTOMY  2001   CORONARY STENT INTERVENTION N/A 04/13/2023   Procedure: CORONARY STENT INTERVENTION;  Surgeon: Marcina Millard, MD;  Location: [facility];  Service: Cardiovascular;  Laterality: N/A;   COSMETIC SURGERY     ELBOW SURGERY Left    ENDARTERECTOMY FEMORAL Right 04/13/2023   Procedure: Right groin exploration with repair of right external iliac artery and right circumflex artery;  Surgeon: Bertram Denver, MD;  Location: [facility] OR[facility];  Service: Vascular;  Laterality: Right;   FRACTURE SURGERY  2016   Arm   LEFT HEART CATH AND CORONARY ANGIOGRAPHY N/A 04/13/2023   Procedure: LEFT HEART CATH AND CORONARY ANGIOGRAPHY;  Surgeon: Marcina Millard, MD;  Location: [facility];  Service: Cardiovascular;  Laterality: N/A;   THORACENTESIS N/A 12/27/2020   Procedure: Alanson Puls;  Surgeon: Josephine Igo, DO;  Location: [facility];  Service: Pulmonary;  Laterality: N/A;   TONSILLECTOMY     TUBAL LIGATION  ?    Social History:  reports that she quit smoking about 34 years ago. Her smoking use included cigarettes. She started smoking about 49 years ago. She has a 3.8 pack-year smoking history. She has been exposed to tobacco smoke.  She has never used smokeless tobacco. She reports current alcohol use of about 14.0 standard drinks of alcohol per week. She reports that she does not use drugs.  Family History:  Family History  Problem Relation Age of Onset   Cancer Mother    Cancer Father    Cancer Sister    Breast cancer Neg Hx      Prior to Admission medications   Medication Sig Start Date End Date Taking? Authorizing Provider  apixaban (ELIQUIS) 2.5 MG TABS tablet Take 2.5 mg by mouth 2 (two) times daily.    [provider]  aspirin 81 MG chewable tablet Chew 1 tablet (81 mg total) by mouth daily for 10 days. 04/21/23 05/01/23  Charise Killian, MD  atorvastatin (LIPITOR) 80 MG tablet Take 80 mg by mouth daily.    [provider]  carvedilol (COREG) 6.25 MG tablet Take 1 tablet (6.25 mg total) by mouth 2 (two) times daily. Patient taking differently: Take 12.5 mg by mouth 2 (two) times daily. 04/27/22   Bacigalupo, Marzella Schlein, MD  cephALEXin (KEFLEX) 500 MG capsule Take 1 capsule (500 mg total) by mouth 3 (three) times daily.  04/21/23   Nada Libman, MD  clopidogrel (PLAVIX) 75 MG tablet Take 1 tablet (75 mg total) by mouth daily. 04/02/23   Erasmo Downer, MD  ezetimibe (ZETIA) 10 MG tablet TAKE 1 TABLET(10 MG) BY MOUTH DAILY 04/05/23   Bacigalupo, Marzella Schlein, MD  famotidine (PEPCID) 40 MG tablet Take 1 tablet (40 mg total) by mouth daily. Patient taking differently: Take 40 mg by mouth daily as needed for indigestion or heartburn. 06/28/20   Erasmo Downer, MD  fentaNYL (DURAGESIC) 25 MCG/HR Place 2 patches onto the skin every 3 (three) days. 04/04/23   Ostwalt, Edmon Crape, PA-C  isosorbide mononitrate (IMDUR) 60 MG 24 hr tablet Take 1 tablet (60 mg total) by mouth daily. 04/21/23 05/21/23  Charise Killian, MD  pantoprazole (PROTONIX) 40 MG tablet Take 1 tablet (40 mg total) by mouth daily. 04/21/23 05/21/23  Charise Killian, MD  pregabalin (LYRICA) 150 MG capsule Take 1 capsule (150  mg total) by mouth 2 (two) times daily. 01/19/23   Erasmo Downer, MD  traZODone (DESYREL) 50 MG tablet Take 0.5-1 tablets (25-50 mg total) by mouth at bedtime as needed for sleep. 09/21/22   Erasmo Downer, MD  valsartan (DIOVAN) 160 MG tablet Take 160 mg by mouth in the morning and at bedtime. 08/30/21   [provider]    Physical Exam: Vitals:   04/22/23 1809 04/22/23 2026 04/22/23 2130 04/22/23 2200  BP: (!) 141/74 (!) 163/74    Pulse: (!) 104 88 96   Resp: (!) 29 20 (!) 22   Temp:  99.7 F (37.6 C)    TempSrc:  Oral    SpO2: 98% 98% 98%   Weight:    54.4 kg  Height:    5' 2.99" (1.6 m)   General: Not in acute distress HEENT:       Eyes: PERRL, EOMI, no jaundice       ENT: No discharge from the ears and nose, no pharynx injection, no tonsillar enlargement.        Neck: No JVD, no bruit, no mass felt. Heme: No neck lymph node enlargement. Cardiac: S1/S2, RRR, No murmurs, No gallops or rubs. Respiratory: No rales, wheezing, rhonchi or rubs. GI: Soft, nondistended, nontender, no rebound pain, no organomegaly, BS present. GU: No hematuria Ext: has trace leg edema bilaterally. 1+DP/PT pulse bilaterally. Musculoskeletal: No joint deformities, No joint redness or warmth, no limitation of ROM in spin. Skin: has tenderness, erythema, warmth in right grossly surgical site, with serosanguineous drainage. Neuro: Alert, oriented X3, cranial nerves II-XII grossly intact, moves all extremities normally. Psych: Patient is not psychotic, no suicidal or hemocidal ideation.  Labs on Admission: I have personally reviewed following labs and imaging studies  CBC: Recent Labs  Lab 04/16/23 0151 04/16/23 0356 04/16/23 1446 04/17/23 0443 04/18/23 0320 04/19/23 0600 04/20/23 0411 04/22/23 1528  WBC 10.2 10.8*  --  9.8 14.0* 12.4* 19.4* 21.3*  NEUTROABS 8.4* 9.0*  --   --   --   --   --  18.9*  HGB 9.2* 9.1*   < > 8.5* 10.2* 9.1* 9.8* 10.3*  HCT 26.8* 26.7*   < > 24.9*  30.5* 27.3* 29.6* 32.4*  MCV 83.5 83.4  --  83.0 85.0 85.6 85.1 88.3  PLT 65* 65*  --  75* 136* 148* 223 365   < > = values in this interval not displayed.   Basic Metabolic Panel: Recent Labs  Lab 04/16/23 0356 04/17/23 0443 04/18/23 0320  04/19/23 0600 04/20/23 0411 04/22/23 1528 04/22/23 1540  NA 137 139 137  137 141 137 137  --   K 3.7 3.3* 3.7  3.7 3.5 3.1* 3.3*  --   CL 102 104 105  104 104 103 104  --   CO2 25 28 24  25 26 23 27   --   GLUCOSE 101* 99 128*  134* 103* 105* 142*  --   BUN 43* 38* 32*  32* 28* 22 30*  --   CREATININE 2.63* 2.08* 1.70*  1.62* 1.18* 1.06* 1.17*  --   CALCIUM 7.4* 7.7* 8.3*  8.0* 8.2* 8.1* 8.0*  --   MG 2.1 2.1 2.3  --   --   --  2.3  PHOS 4.4 3.2 2.2* 2.5 2.4*  --  2.7   GFR: Estimated Creatinine Clearance: 32.3 mL/min (A) (by C-G formula based on SCr of 1.17 mg/dL (H)). Liver Function Tests: Recent Labs  Lab 04/16/23 0356 04/17/23 0443 04/18/23 0320 04/19/23 0600 04/20/23 0411 04/22/23 1528  AST 530*  --  484*  --   --  54*  ALT 325*  --  461*  --   --  83*  ALKPHOS 47  --  75  --   --  88  BILITOT 1.5*  --  2.5*  --   --  2.8*  PROT 4.8*  --  5.7*  --   --  5.9*  ALBUMIN 2.8* 2.5* 2.9*  2.9* 2.5* 2.8* 2.6*   No results for input(s): "LIPASE", "AMYLASE" in the last 168 hours. No results for input(s): "AMMONIA" in the last 168 hours. Coagulation Profile: Recent Labs  Lab 04/16/23 0151 04/22/23 1540  INR 1.2 1.5*   Cardiac Enzymes: No results for input(s): "CKTOTAL", "CKMB", "CKMBINDEX", "TROPONINI" in the last 168 hours. BNP (last 3 results) No results for input(s): "PROBNP" in the last 8760 hours. HbA1C: No results for input(s): "HGBA1C" in the last 72 hours. CBG: No results for input(s): "GLUCAP" in the last 168 hours. Lipid Profile: No results for input(s): "CHOL", "HDL", "LDLCALC", "TRIG", "CHOLHDL", "LDLDIRECT" in the last 72 hours. Thyroid Function Tests: No results for input(s): "TSH", "T4TOTAL",  "FREET4", "T3FREE", "THYROIDAB" in the last 72 hours. Anemia Panel: No results for input(s): "VITAMINB12", "FOLATE", "FERRITIN", "TIBC", "IRON", "RETICCTPCT" in the last 72 hours. Urine analysis:    Component Value Date/Time   COLORURINE YELLOW (A) 04/22/2023 2041   APPEARANCEUR CLEAR (A) 04/22/2023 2041   LABSPEC 1.043 (H) 04/22/2023 2041   PHURINE 6.0 04/22/2023 2041   GLUCOSEU NEGATIVE 04/22/2023 2041   HGBUR SMALL (A) 04/22/2023 2041   BILIRUBINUR NEGATIVE 04/22/2023 2041   KETONESUR NEGATIVE 04/22/2023 2041   PROTEINUR NEGATIVE 04/22/2023 2041   NITRITE NEGATIVE 04/22/2023 2041   LEUKOCYTESUR NEGATIVE 04/22/2023 2041   Sepsis Labs: @LABRCNTIP (procalcitonin:4,lacticidven:4) ) Recent Results (from the past 240 hour(s))  Body fluid culture w Gram Stain     Status: None   Collection Time: 04/13/23 11:47 AM   Specimen: PATH Cytology Pleural fluid  Result Value Ref Range Status   Specimen Description   Final    PLEURAL Performed at [facility], [address] [place], [place] [address]    Special Requests   Final    NONE Performed at [facility], [address] [place], [place] [address]    Gram Stain   Final    WBC PRESENT, PREDOMINANTLY MONONUCLEAR NO ORGANISMS SEEN CYTOSPIN SMEAR    Culture   Final    NO  GROWTH 3 DAYS Performed at [facility], [address], [place], [place] [address]    Report Status 04/16/2023 FINAL  Final  MRSA Next Gen by PCR, Nasal     Status: None   Collection Time: 04/13/23 11:00 PM   Specimen: Nasal Mucosa; Nasal Swab  Result Value Ref Range Status   MRSA by PCR Next Gen NOT DETECTED NOT DETECTED Final    Comment: (NOTE) The GeneXpert MRSA Assay (FDA approved for NASAL specimens only), is one component of a comprehensive MRSA colonization surveillance program. It is not intended to diagnose MRSA infection nor to guide or monitor treatment for MRSA infections. Test performance is not FDA approved in  patients less than 73 years old. Performed at [facility], [address] [place], [place] [address]   C Difficile Quick Screen w PCR reflex     Status: None   Collection Time: 04/22/23  4:19 PM   Specimen: STOOL  Result Value Ref Range Status   C Diff antigen NEGATIVE NEGATIVE Final   C Diff toxin NEGATIVE NEGATIVE Final   C Diff interpretation No C. difficile detected.  Final    Comment: Performed at [facility], [address] [place], [place] [address]     Radiological Exams on Admission: CT ABDOMEN PELVIS W CONTRAST  Result Date: 04/22/2023 CLINICAL DATA:  Right lower quadrant abdominal pain. EXAM: CT ABDOMEN AND PELVIS WITH CONTRAST TECHNIQUE: Multidetector CT imaging of the abdomen and pelvis was performed using the standard protocol following bolus administration of intravenous contrast. RADIATION DOSE REDUCTION: This exam was performed according to the departmental dose-optimization program which includes automated exposure control, adjustment of the mA and/or kV according to patient size and/or use of iterative reconstruction technique. CONTRAST:  75mL OMNIPAQUE IOHEXOL 300 MG/ML  SOLN COMPARISON:  April 13, 2023. FINDINGS: Lower chest: Large left pleural effusion is noted with small right pleural effusion, with adjacent atelectasis of the lower lobes. Hepatobiliary: No focal liver abnormality is seen. No gallstones, gallbladder wall thickening, or biliary dilatation. Pancreas: Unremarkable. No pancreatic ductal dilatation or surrounding inflammatory changes. Spleen: Normal in size without focal abnormality. Adrenals/Urinary Tract: Adrenal glands and kidneys appear normal. No hydronephrosis or renal obstruction is noted. Urinary bladder is displaced to the left by moderate size right-sided pelvic hematoma which appears to be slightly enlarged compared to prior exam. Stomach/Bowel: The stomach is unremarkable. There is no evidence of bowel obstruction or  inflammation. Status post appendectomy. Vascular/Lymphatic: Aortic atherosclerosis. No enlarged abdominal or pelvic lymph nodes. Reproductive: Status post hysterectomy. No adnexal masses. Other: As noted above, moderate size right-sided retroperitoneal hematoma is noted which extends into right pelvic region, where it is mildly enlarged. Subcutaneous gas is noted in the right lower quadrant suggesting postsurgical change. Musculoskeletal: No acute or significant osseous findings. IMPRESSION: Moderate sized right retroperitoneal hematoma is noted which extends into right pelvic region and displaces urinary bladder to the left. The pelvic component appears to be mildly enlarged compared to prior exam. Bilateral pleural effusions are noted, left greater than right, with associated atelectasis of the lower lobes. Electronically Signed   By: Lupita Raider M.D.   On: 04/22/2023 17:45   DG Chest Port 1 View  Result Date: 04/22/2023 CLINICAL DATA:  Possible wound infection.  Hypotension. EXAM: PORTABLE CHEST 1 VIEW COMPARISON:  April 16, 2023. FINDINGS: Interval development of moderate size left pleural effusion with probable underlying atelectasis or pneumonia. Right lung is clear. IMPRESSION: Interval development of moderate size  left pleural effusion with probable underlying atelectasis or infiltrate. Electronically Signed   By: Lupita Raider M.D.   On: 04/22/2023 16:49      Assessment/Plan Principal Problem:   Cellulitis of right groin Active Problems:   Sepsis (HCC)   CAD (coronary artery disease)   Peritoneal hematoma   Essential hypertension   Chronic diastolic heart failure (HCC)   Hypokalemia   Chronic kidney disease, stage 3a (HCC)   Pleural effusion   Abnormal LFTs   Atrial fibrillation, chronic (HCC)   Diarrhea   Chronic pain syndrome   HLD (hyperlipidemia)   Assessment and Plan:  Sepsis due to cellulitis of right groin: Patient has sepsis with WBC 21.3, heart rate 104, RR 29.   Lactic acid normal 1.7.  Dr. Myra Gianotti of [facility] is consulted by EDP.  Per his note  "I removed multiple staples and probed this area with a Q-tip.  The Q-tip does not go down to the femoral sheath.  I packed this with 4 x 4 gauze.  She should continue with IV antibiotics.  She will likely need operative debridement".   - will admit to tele bed as inpatient - Empiric antimicrobial treatment with vancomycin and Rocephin - PRN Zofran for nausea, oxycodone for pain - Blood cultures x 2  - ESR and CRP - IVF: 1.0 L of NS bolus in ED - NPO after MN  Hx of CAD (coronary artery disease): s/p of recent DES.  Since patient may need surgical debridement, will hold the Plavix, and switch Eliquis to IV heparin. -continue ASA -Hold Plavix --> will switch Eliquis to IV heparin due to possible surgical debridement  Peritoneal hematoma: Hemoglobin stable 10.3.  Her hemoglobin was 9.8 on 04/20/2023. -Follow-up CBC  Essential hypertension: Blood pressure 141/74. -IV hydralazine as needed -Continue Coreg which is also for A-fib -Hold home blood pressure medications including Diovan and Imdur since patient is at high risk of developing hypotension due to sepsis  Chronic diastolic heart failure (HCC): 2D echo on 03/01/2022 showed EF> 55% with grade 2 diastolic dysfunction.  Patient has elevated BNP 626, but  pt only has trace leg edema, oxygen saturation 98% on room air.  Does not seem to have CHF exacerbation. -Will not give diuretics due to sepsis -Watch volume status closely  Hypokalemia: Potassium 3.3.  Magnesium 2.3, phosphorus 2.7 -Repleted potassium  Chronic kidney disease, stage 3a (HCC): Stable -Follow-up with BMP  Pleural effusion: This is a recurrent issue.  Patient recently had left thoracentesis on 9/22.  Patient has large left pleural effusion, will need thoracentesis again.  Currently patient does not have oxygen desaturation or respiratory distress.  Will hold off thoracentesis now since  patient may need surgical debridement treatment -As needed albuterol  Abnormal LFTs: her liver function has been improving.  She has ALP 80, AST 54, ALT 83, total bilirubin 2.8, which is better than recent liver function. -Avoid using Tylenol  Atrial fibrillation, chronic (HCC): Heart rate 104 -Switch Eliquis to IV heparin -Continue Coreg  Diarrhea: C. difficile negative. -Check GI pathogen panel -As needed Imodium  Chronic pain syndrome -Continue home fentanyl patch and Lyrica  HLD: -lipitor and zetia      DVT ppx: on IV Heparin   Code Status: Full code   Family Communication: not done, no family member is at bed side.    Disposition Plan:  Anticipate discharge back to previous environment  Consults called:  Dr. Myra Gianotti of [facility]   Admission status and Level of  care: Telemetry Medical:    as inpt       Dispo: The patient is from: Home              Anticipated d/c is to: Home              Anticipated d/c date is: 2 days              Patient currently is not medically stable to d/c.    Severity of Illness:  The appropriate patient status for this patient is INPATIENT. Inpatient status is judged to be reasonable and necessary in order to provide the required intensity of service to ensure the patient's safety. The patient's presenting symptoms, physical exam findings, and initial radiographic and laboratory data in the context of their chronic comorbidities is felt to place them at high risk for further clinical deterioration. Furthermore, it is not anticipated that the patient will be medically stable for discharge from the hospital within 2 midnights of admission.   * I certify that at the point of admission it is my clinical judgment that the patient will require inpatient hospital care spanning beyond 2 midnights from the point of admission due to high intensity of service, high risk for further deterioration and high frequency of surveillance  required.*       Date of Service 04/23/2023    Lorretta Harp [facility]   If 7PM-7AM, please contact night-coverage [email or web address] 04/23/2023, 1:23 AM

## 2023-04-22 NOTE — ED Triage Notes (Signed)
Pt here from home with c/o hypotension and possible wound infection from right groin wound from a prior cath done 9 days ago wound is red and warm to touch

## 2023-04-22 NOTE — ED Provider Notes (Signed)
[facility] Provider Note    Event Date/Time   First MD Initiated Contact with Patient 04/22/23 1529     (approximate)   History   Chief Complaint: Hypotension and Wound Infection   HPI  Dawn Bruce is a 77 y.o. female with a history of hypertension, recent NSTEMI with left heart cath on 04/13/2023 complicated by iliac artery laceration with retroperitoneal hematoma requiring exploration and repair by vascular surgery who was discharged home 2 days ago, yesterday started noticing redness swelling increased pain and fell shoulder at her right groin incision.  Vascular surgery called in Keflex for her, she took 1 dose yesterday.  This morning, she felt very weak and lightheaded, called EMS who reported an initial blood pressure of 80/50 on first check but which normalized afterward prior to IV fluids.  Patient reports she continues to feel unwell  Reviewed additional outside records noting that on patient's first hospital day on April 12, 2023 she had left thoracentesis due to pleural effusion.  Patient reports compliance with aspirin, Plavix, Eliquis that she was discharged from the hospital on.        Physical Exam   Triage Vital Signs: ED Triage Vitals  Encounter Vitals Group     BP 04/22/23 1534 (!) 163/80     Systolic BP Percentile --      Diastolic BP Percentile --      Pulse Rate 04/22/23 1534 (!) 101     Resp 04/22/23 1534 15     Temp 04/22/23 1534 98.4 F (36.9 C)     Temp src --      SpO2 04/22/23 1534 100 %     Weight --      Height --      Head Circumference --      Peak Flow --      Pain Score 04/22/23 1532 0     Pain Loc --      Pain Education --      Exclude from Growth Chart --     Most recent vital signs: Vitals:   04/22/23 1630 04/22/23 1809  BP: (!) 163/58 (!) 141/74  Pulse: 96 (!) 104  Resp: (!) 25 (!) 29  Temp:    SpO2: 93% 98%    General: Awake, no distress.  CV:  Good peripheral perfusion.  Regular rate and  rhythm.  Normal DP pulse bilaterally Resp:  Normal effort.  Clear to auscultation bilaterally Abd:  No distention.  Soft without peritoneal signs.  Right lower abdominal wall has erythema induration warmth and tenderness Other:  Right lower abdominal wall and anterior right hip has purplish red erythema, warmth tenderness induration over a widespread area with streaking.  There is a foul odor.  There is some scant purulent discharge from the incision.  No crepitus   ED Results / Procedures / Treatments   Labs (all labs ordered are listed, but only abnormal results are displayed) Labs Reviewed  COMPREHENSIVE METABOLIC PANEL - Abnormal; Notable for the following components:      Result Value   Potassium 3.3 (*)    Glucose, Bld 142 (*)    BUN 30 (*)    Creatinine, Ser 1.17 (*)    Calcium 8.0 (*)    Total Protein 5.9 (*)    Albumin 2.6 (*)    AST 54 (*)    ALT 83 (*)    Total Bilirubin 2.8 (*)    GFR, Estimated 47 (*)    All other  components within normal limits  CBC WITH DIFFERENTIAL/PLATELET - Abnormal; Notable for the following components:   WBC 21.3 (*)    RBC 3.67 (*)    Hemoglobin 10.3 (*)    HCT 32.4 (*)    RDW 17.3 (*)    Neutro Abs 18.9 (*)    Monocytes Absolute 1.2 (*)    Abs Immature Granulocytes 0.25 (*)    All other components within normal limits  C DIFFICILE QUICK SCREEN W PCR REFLEX    CULTURE, BLOOD (ROUTINE X 2)  CULTURE, BLOOD (ROUTINE X 2)  LACTIC ACID, PLASMA  URINALYSIS, W/ REFLEX TO CULTURE (INFECTION SUSPECTED)     EKG Interpreted by me Sinus rhythm, rate of 90.  Normal axis, mildly prolonged QTc of 530 ms.  Poor R wave progression.  Normal ST segments.  Diffuse inferior and anterolateral T wave inversion.   RADIOLOGY Chest x-ray interpreted by me, shows left-sided pleural effusion.  Radiology report reviewed   PROCEDURES:  .Critical Care  Performed by: Sharman Cheek, MD Authorized by: Sharman Cheek, MD   Critical care provider  statement:    Critical care time (minutes):  35   Critical care time was exclusive of:  Separately billable procedures and treating other patients   Critical care was necessary to treat or prevent imminent or life-threatening deterioration of the following conditions:  Sepsis, cardiac failure and respiratory failure   Critical care was time spent personally by me on the following activities:  Development of treatment plan with patient or surrogate, discussions with consultants, evaluation of patient's response to treatment, examination of patient, obtaining history from patient or surrogate, ordering and performing treatments and interventions, ordering and review of laboratory studies, ordering and review of radiographic studies, pulse oximetry, re-evaluation of patient's condition and review of old charts   Care discussed with: admitting provider      MEDICATIONS ORDERED IN ED: Medications  sodium chloride 0.9 % bolus 1,000 mL (0 mLs Intravenous Stopped 04/22/23 1832)  vancomycin (VANCOCIN) IVPB 1000 mg/200 mL premix (0 mg Intravenous Stopped 04/22/23 1827)  cefTRIAXone (ROCEPHIN) 2 g in sodium chloride 0.9 % 100 mL IVPB (0 g Intravenous Stopped 04/22/23 1704)  iohexol (OMNIPAQUE) 300 MG/ML solution 75 mL (75 mLs Intravenous Contrast Given 04/22/23 1652)     IMPRESSION / MDM / ASSESSMENT AND PLAN / ED COURSE  I reviewed the triage vital signs and the nursing notes.  DDx: Cellulitis, abscess, pseudoaneurysm/persistent arterial bleeding, necrotizing fasciitis, dehydration, pleural effusion, pulmonary edema, AKI, electrolyte abnormality, anemia, C. difficile colitis  Patient's presentation is most consistent with acute presentation with potential threat to life or bodily function.  Patient presents with clinically apparent soft tissue infection over right groin incision.  It is extensive, she is mildly tachycardic and with episode of hypotension at home, I am concerned for sepsis.  Labs reveal  leukocytosis.  Will give Rocephin and vancomycin, IV fluid bolus, check labs.  EKG shows diffuse T wave inversions which are new compared to previous EKGs.  Will check troponins.  Will check chest x-ray.  PE given dual antiplatelet therapy and Eliquis.  Check stool for C. difficile.  Will check CT abdomen pelvis to evaluate for recurrent internal/retroperitoneal bleeding versus abscess formation versus necrotizing/gas-forming infection.  Will need to hospitalize.    ----------------------------------------- 6:34 PM on 04/22/2023 ----------------------------------------- CT shows evolving hematoma and some subcutaneous emphysema, all likely sequela of recent bleeding complication and surgical exploration.  No deep space abscess or suspicious necrotizing infection identified.  Case discussed with  vascular surgery Dr. Myra Gianotti who agrees with hospitalization will plan to consult on the patient in the morning to consider reexploration.  Patient and family at bedside updated.  Case discussed with hospitalist.     FINAL CLINICAL IMPRESSION(S) / ED DIAGNOSES   Final diagnoses:  Cellulitis of right thigh     Rx / DC Orders   ED Discharge Orders     None        Note:  This document was prepared using Dragon voice recognition software and may include unintentional dictation errors.   Sharman Cheek, MD 04/22/23 [phone]

## 2023-04-22 NOTE — Progress Notes (Signed)
CODE SEPSIS - PHARMACY COMMUNICATION  **Broad Spectrum Antibiotics should be administered within 1 hour of Sepsis diagnosis**  Time Code Sepsis Called/Page Received: 1541  Antibiotics Ordered: ceftriaxone, vancomycin  Time of 1st antibiotic administration: 1601  Additional action taken by pharmacy: N/A  If necessary, Name of Provider/Nurse Contacted: N/A    Merryl Hacker ,PharmD Clinical Pharmacist  04/22/2023  3:42 PM

## 2023-04-22 NOTE — Progress Notes (Signed)
Patient returned to the ER today with worsening drainage and fevers.  I was asked to evaluate her.  There is significantly more cellulitis in her right groin.  She continues to have serous drainage.  I removed multiple staples and probed this area with a Q-tip.  The Q-tip does not go down to the femoral sheath.  I packed this with 4 x 4 gauze.  She should continue with IV antibiotics.  She will likely need operative debridement.  Dawn Bruce

## 2023-04-23 ENCOUNTER — Other Ambulatory Visit: Payer: Self-pay

## 2023-04-23 DIAGNOSIS — R6520 Severe sepsis without septic shock: Secondary | ICD-10-CM | POA: Insufficient documentation

## 2023-04-23 DIAGNOSIS — T148XXA Other injury of unspecified body region, initial encounter: Secondary | ICD-10-CM

## 2023-04-23 DIAGNOSIS — K661 Hemoperitoneum: Secondary | ICD-10-CM | POA: Diagnosis not present

## 2023-04-23 DIAGNOSIS — L03314 Cellulitis of groin: Secondary | ICD-10-CM | POA: Diagnosis not present

## 2023-04-23 DIAGNOSIS — A419 Sepsis, unspecified organism: Secondary | ICD-10-CM | POA: Diagnosis not present

## 2023-04-23 DIAGNOSIS — L089 Local infection of the skin and subcutaneous tissue, unspecified: Secondary | ICD-10-CM | POA: Diagnosis not present

## 2023-04-23 DIAGNOSIS — L03115 Cellulitis of right lower limb: Principal | ICD-10-CM | POA: Insufficient documentation

## 2023-04-23 DIAGNOSIS — I5020 Unspecified systolic (congestive) heart failure: Secondary | ICD-10-CM | POA: Insufficient documentation

## 2023-04-23 LAB — APTT
aPTT: 85 s — ABNORMAL HIGH (ref 24–36)
aPTT: 44 s — ABNORMAL HIGH (ref 24–36)

## 2023-04-23 LAB — GASTROINTESTINAL PANEL BY PCR, STOOL (REPLACES STOOL CULTURE)

## 2023-04-23 LAB — BASIC METABOLIC PANEL
Anion gap: 10 (ref 5–15)
BUN: 23 mg/dL (ref 8–23)
CO2: 22 mmol/L (ref 22–32)
Calcium: 7.9 mg/dL — ABNORMAL LOW (ref 8.9–10.3)
Chloride: 109 mmol/L (ref 98–111)
Creatinine, Ser: 1.02 mg/dL — ABNORMAL HIGH (ref 0.44–1.00)
GFR, Estimated: 56 mL/min — ABNORMAL LOW (ref >60)
Glucose, Bld: 97 mg/dL (ref 70–99)
Potassium: 3.5 mmol/L (ref 3.5–5.1)
Sodium: 141 mmol/L (ref 135–145)

## 2023-04-23 LAB — CBC
HCT: 29.1 % — ABNORMAL LOW (ref 36.0–46.0)
Hemoglobin: 9.4 g/dL — ABNORMAL LOW (ref 12.0–15.0)
MCH: 27.4 pg (ref 26.0–34.0)
MCHC: 32.3 g/dL (ref 30.0–36.0)
MCV: 84.8 fL (ref 80.0–100.0)
Platelets: 320 10*3/uL (ref 150–400)
RBC: 3.43 MIL/uL — ABNORMAL LOW (ref 3.87–5.11)
RDW: 17.3 % — ABNORMAL HIGH (ref 11.5–15.5)
WBC: 14.5 10*3/uL — ABNORMAL HIGH (ref 4.0–10.5)
nRBC: 0 % (ref 0.0–0.2)

## 2023-04-23 LAB — HEPARIN LEVEL (UNFRACTIONATED): Heparin Unfractionated: >1.1 [IU]/mL — ABNORMAL HIGH (ref 0.30–0.70)

## 2023-04-23 LAB — C-REACTIVE PROTEIN: CRP: 20.7 mg/dL — ABNORMAL HIGH (ref <1.0)

## 2023-04-23 MED ORDER — ALUM & MAG HYDROXIDE-SIMETH 200-200-20 MG/5ML PO SUSP: 15.0000 mL | Freq: Once | ORAL | Status: DC

## 2023-04-23 MED ORDER — SODIUM CHLORIDE 0.9% FLUSH
10.0000 mL | INTRAVENOUS | Status: DC | PRN
  Administered 2023-05-11: 10 mL

## 2023-04-23 MED ORDER — VANCOMYCIN HCL IN DEXTROSE 1-5 GM/200ML-% IV SOLN: 1000.0000 mg | INTRAVENOUS | Status: DC

## 2023-04-23 MED ORDER — FENTANYL 75 MCG/HR TD PT72
1.0000 | MEDICATED_PATCH | TRANSDERMAL | Status: DC
  Administered 2023-04-23 – 2023-05-11 (×7): 1 via TRANSDERMAL
  Filled 2023-04-23 (×8): qty 1

## 2023-04-23 MED ORDER — EZETIMIBE 10 MG PO TABS
10.0000 mg | ORAL_TABLET | Freq: Every day | ORAL | Status: DC
  Administered 2023-04-23 – 2023-05-13 (×20): 10 mg via ORAL
  Filled 2023-04-23 (×21): qty 1

## 2023-04-23 MED ORDER — CARVEDILOL 12.5 MG PO TABS
12.5000 mg | ORAL_TABLET | Freq: Two times a day (BID) | ORAL | Status: DC
  Administered 2023-04-23 – 2023-04-28 (×11): 12.5 mg via ORAL
  Filled 2023-04-23: qty 2
  Filled 2023-04-23 (×10): qty 1

## 2023-04-23 MED ORDER — ACETAMINOPHEN 325 MG PO TABS
650.0000 mg | ORAL_TABLET | Freq: Four times a day (QID) | ORAL | Status: DC | PRN
  Administered 2023-04-25 – 2023-05-12 (×10): 650 mg via ORAL
  Filled 2023-04-23 (×11): qty 2

## 2023-04-23 MED ORDER — ASPIRIN 81 MG PO TBEC
81.0000 mg | DELAYED_RELEASE_TABLET | Freq: Every day | ORAL | Status: DC
  Administered 2023-04-24 – 2023-05-13 (×20): 81 mg via ORAL
  Filled 2023-04-23 (×20): qty 1

## 2023-04-23 MED ORDER — ASPIRIN 81 MG PO CHEW: 81.0000 mg | CHEWABLE_TABLET | Freq: Every day | ORAL | Status: DC

## 2023-04-23 MED ORDER — SODIUM CHLORIDE 0.9% FLUSH
10.0000 mL | Freq: Two times a day (BID) | INTRAVENOUS | Status: DC
  Administered 2023-04-23 – 2023-04-26 (×6): 10 mL
  Administered 2023-04-27: 20 mL
  Administered 2023-04-27 – 2023-04-28 (×2): 10 mL
  Administered 2023-04-28 – 2023-04-29 (×2): 20 mL
  Administered 2023-04-29 – 2023-05-03 (×7): 10 mL
  Administered 2023-05-04: 20 mL
  Administered 2023-05-04 – 2023-05-13 (×19): 10 mL

## 2023-04-23 MED ORDER — ALBUTEROL SULFATE HFA 108 (90 BASE) MCG/ACT IN AERS
2.0000 | INHALATION_SPRAY | RESPIRATORY_TRACT | Status: DC | PRN
Start: 2023-04-23 — End: 2023-04-23

## 2023-04-23 MED ORDER — LINEZOLID 600 MG/300ML IV SOLN
600.0000 mg | Freq: Two times a day (BID) | INTRAVENOUS | Status: DC
  Administered 2023-04-23 – 2023-04-30 (×14): 600 mg via INTRAVENOUS
  Filled 2023-04-23 (×15): qty 300

## 2023-04-23 MED ORDER — SODIUM CHLORIDE 0.9 % IV SOLN
2.0000 g | Freq: Two times a day (BID) | INTRAVENOUS | Status: DC
  Administered 2023-04-23 – 2023-04-30 (×14): 2 g via INTRAVENOUS
  Filled 2023-04-23 (×15): qty 12.5

## 2023-04-23 MED ORDER — ALBUTEROL SULFATE (2.5 MG/3ML) 0.083% IN NEBU
2.5000 mg | INHALATION_SOLUTION | RESPIRATORY_TRACT | Status: DC | PRN
  Administered 2023-04-30 – 2023-05-12 (×23): 2.5 mg via RESPIRATORY_TRACT
  Filled 2023-04-23 (×24): qty 3

## 2023-04-23 MED ORDER — PANTOPRAZOLE SODIUM 40 MG PO TBEC
40.0000 mg | DELAYED_RELEASE_TABLET | Freq: Every day | ORAL | Status: DC
  Administered 2023-04-24 – 2023-05-03 (×10): 40 mg via ORAL
  Filled 2023-04-23 (×11): qty 1

## 2023-04-23 MED ORDER — SODIUM CHLORIDE 0.9 % IV SOLN
2.0000 g | INTRAVENOUS | Status: DC
  Administered 2023-04-23: 2 g via INTRAVENOUS
  Filled 2023-04-23: qty 20

## 2023-04-23 MED ORDER — HEPARIN BOLUS VIA INFUSION
1600.0000 [IU] | Freq: Once | INTRAVENOUS | Status: AC
  Administered 2023-04-23: 1600 [IU] via INTRAVENOUS
  Filled 2023-04-23: qty 1600

## 2023-04-23 MED ORDER — FUROSEMIDE 10 MG/ML IJ SOLN
40.0000 mg | Freq: Once | INTRAMUSCULAR | Status: AC
  Administered 2023-04-23: 40 mg via INTRAVENOUS
  Filled 2023-04-23: qty 4

## 2023-04-23 MED ORDER — PHENYLEPHRINE IN HARD FAT 0.25 % RE SUPP: 1.0000 | RECTAL | Status: DC | PRN

## 2023-04-23 MED ORDER — ATORVASTATIN CALCIUM 20 MG PO TABS
80.0000 mg | ORAL_TABLET | Freq: Every day | ORAL | Status: DC
  Administered 2023-04-23 – 2023-05-04 (×12): 80 mg via ORAL
  Filled 2023-04-23 (×12): qty 4

## 2023-04-23 MED ORDER — CHLORHEXIDINE GLUCONATE CLOTH 2 % EX PADS
6.0000 | MEDICATED_PAD | Freq: Every day | CUTANEOUS | Status: DC
  Administered 2023-04-23 – 2023-05-13 (×21): 6 via TOPICAL
  Filled 2023-04-23: qty 6

## 2023-04-23 NOTE — Consult Note (Signed)
Pharmacy Antibiotic Note  Chylee Konkel is a 77 y.o. female with medical history significant of dCHF, CAD (s/p of recent DES), A fib on Eliquis, HTN, HLD, CDK-3a, chronic pain syndrome, multiple skin cancer, perforated appendicitis, former smoker admitted on 04/22/2023 with post-operative  right groin infection . ID following.  Pharmacy has been consulted for cefepime dosing. Patient is also ordered linezolid.  Plan:  Cefepime 2 g IV q12h  Height: 5' 2.99" (160 cm) Weight: 54.4 kg (119 lb 14.9 oz) IBW/kg (Calculated) : 52.38  Temp (24hrs), Avg:98.5 F (36.9 C), Min:98 F (36.7 C), Max:99.7 F (37.6 C)  Recent Labs  Lab 04/18/23 0320 04/19/23 0600 04/20/23 0411 04/22/23 1528 04/23/23 0631  WBC 14.0* 12.4* 19.4* 21.3* 14.5*  CREATININE 1.70*  1.62* 1.18* 1.06* 1.17* 1.02*  LATICACIDVEN  --   --   --  1.7  --     Estimated Creatinine Clearance: 37 mL/min (A) (by C-G formula based on SCr of 1.02 mg/dL (H)).    No Known Allergies  Antimicrobials this admission: Vancomycin 12/1 x 1 Ceftriaxone 12/1 >> 12/2 Cefepime 12/2 >>  Linezolid 12/2 >>   Dose adjustments this admission: N/A  Microbiology results: 12/1 BCx: NGTD 12/1 C diff: (-) 12/1 GIP: (-) 12/2 Wound culture: pending  Thank you for allowing pharmacy to be a part of this patient's care.  Tressie Ellis 04/23/2023 4:59 PM

## 2023-04-23 NOTE — Consult Note (Signed)
PULMONOLOGY         Date: 04/23/2023,   MRN# [id number] Dawn Bruce Jul 22, 1945     AdmissionWeight: 54.4 kg                 CurrentWeight: 54.4 kg  Referring provider: Dr Gilda Crease   CHIEF COMPLAINT:   Complete atelectasis of left lung with bilateral pleural effusions.    HISTORY OF PRESENT ILLNESS   77 year old female with a history of chronic GERD, basal cell carcinoma squamous cell carcinoma history of NSTEMI diastolic CHF, CAD status post recent PCI with DES, chronic A-fib on Eliquis, essential hypertension, dyslipidemia CKD 3A chronic pain syndrome remote perforated appendicitis, hospitalized November 2024 with non-STEMI.  Post left heart cath had developed retroperitoneal hematoma and hemorrhagic shock with complications of iliac artery laceration.  Status post surgical repair via vascular surgery service.  Had outpatient therapy with antibiotics with failure came in with transient hypotension and circulatory shock.  Found to have elevated cardiac biomarkers and acute on chronic CHF.  Vascular surgery was reconsulted today and PCCM consultation was placed to incidental finding of complete atelectasis of the left lung with surrounding bilateral pleural effusions worse on the left.   PAST MEDICAL HISTORY   Past Medical History:  Diagnosis Date   Actinic keratosis    Chronic pain    GERD (gastroesophageal reflux disease) ?   Taking Pepcid   History of basal cell carcinoma (BCC) 05/02/2021   right upper back paraspinal   History of SCC (squamous cell carcinoma) of skin 09/10/2019   left dorsum wrist ED&C done 10/28/19   History of SCC (squamous cell carcinoma) of skin 03/04/2020   right dorsum hand   History of squamous cell carcinoma in situ (SCCIS) 03/04/2020   left dorsum wrist  ED&C   History of squamous cell carcinoma in situ (SCCIS) 03/04/2020   right medial infraorbital  ED&C 04/28/2020   History of squamous cell carcinoma in situ (SCCIS) 10/28/2019   right  dorsum hand proximal lateral   History of squamous cell carcinoma in situ (SCCIS) 10/28/2019   right dorsum hand proximal medial   Hyperlipidemia    Hypertension    NSTEMI (non-ST elevated myocardial infarction) (HCC) 04/12/2023   Renal disorder    Squamous cell carcinoma in situ 10/31/2021   left distal  tricep, EDC   Squamous cell carcinoma of skin 08/28/2022   Right volar forearm - EDC     SURGICAL HISTORY   Past Surgical History:  Procedure Laterality Date   ABDOMINAL HYSTERECTOMY     APPENDECTOMY  April 2023   Burst appendix   AUGMENTATION MAMMAPLASTY Bilateral    25-30 years ago   CARDIOVERSION N/A 12/01/2020   Procedure: CARDIOVERSION;  Surgeon: Debbe Odea, MD;  Location: [facility] ORS;  Service: Cardiovascular;  Laterality: N/A;   CAROTID ARTERY ANGIOPLASTY Right 1995(approximate)   CAROTID ENDARTERECTOMY  2001   CORONARY STENT INTERVENTION N/A 04/13/2023   Procedure: CORONARY STENT INTERVENTION;  Surgeon: Marcina Millard, MD;  Location: [facility] INVASIVE CV [facility];  Service: Cardiovascular;  Laterality: N/A;   COSMETIC SURGERY     ELBOW SURGERY Left    ENDARTERECTOMY FEMORAL Right 04/13/2023   Procedure: Right groin exploration with repair of right external iliac artery and right circumflex artery;  Surgeon: Bertram Denver, MD;  Location: [facility] ORS;  Service: Vascular;  Laterality: Right;   FRACTURE SURGERY  2016   Arm   LEFT HEART CATH AND CORONARY ANGIOGRAPHY N/A 04/13/2023   Procedure: LEFT  HEART CATH AND CORONARY ANGIOGRAPHY;  Surgeon: Marcina Millard, MD;  Location: [facility] INVASIVE CV LAB;  Service: Cardiovascular;  Laterality: N/A;   THORACENTESIS N/A 12/27/2020   Procedure: Alanson Puls;  Surgeon: Josephine Igo, DO;  Location: [facility];  Service: Pulmonary;  Laterality: N/A;   TONSILLECTOMY     TUBAL LIGATION  ?     FAMILY HISTORY   Family History  Problem Relation Age of Onset   Cancer Mother    Cancer Father    Cancer Sister    Breast  cancer Neg Hx      SOCIAL HISTORY   Social History   Tobacco Use   Smoking status: Former    Current packs/day: 0.00    Average packs/day: 0.3 packs/day for 15.0 years (3.8 ttl pk-yrs)    Types: Cigarettes    Start date: 61    Quit date: 78    Years since quitting: 34.9    Passive exposure: Past   Smokeless tobacco: Never   Tobacco comments:    quit around 1990-1995 (?)  Vaping Use   Vaping status: Never Used  Substance Use Topics   Alcohol use: Yes    Alcohol/week: 14.0 standard drinks of alcohol    Types: 14 Standard drinks or equivalent per week    Comment: 2 scotch shots   Drug use: Never     MEDICATIONS    Home Medication:  Current Outpatient Rx   Order #: [id number] Class: Historical Med   Order #: [id number] Class: No Print   Order #: [id number] Class: Historical Med   Order #: [id number] Class: Normal   Order #: [id number] Class: Normal   Order #: [id number] Class: Normal   Order #: [id number] Class: Normal   Order #: [id number] Class: Normal   Order #: [id number] Class: Normal   Order #: [id number] Class: Normal   Order #: [id number] Class: Normal   Order #: [id number] Class: Normal   Order #: [id number] Class: Historical Med   Order #: [id number] Class: Normal   Order #: [id number] Class: Historical Med    Current Medication:  Current Facility-Administered Medications:    acetaminophen (TYLENOL) tablet 650 mg, 650 mg, Oral, Q6H PRN, Leeroy Bock, MD   albuterol (PROVENTIL) (2.5 MG/3ML) 0.083% nebulizer solution 2.5 mg, 2.5 mg, Nebulization, Q4H PRN, Otelia Sergeant, RPH   [START ON 04/24/2023] aspirin EC tablet 81 mg, 81 mg, Oral, Daily, Anderson, Chelsey L, MD   atorvastatin (LIPITOR) tablet 80 mg, 80 mg, Oral, Daily, Lorretta Harp, MD, 80 mg at 04/23/23 0953   carvedilol (COREG) tablet 12.5 mg, 12.5 mg, Oral, BID, Lorretta Harp, MD, 12.5 mg at 04/23/23 3536   cefTRIAXone (ROCEPHIN) 2 g in sodium chloride 0.9 % 100 mL IVPB, 2 g, Intravenous, Q24H, Anderson, Chelsey L,  MD   ezetimibe (ZETIA) tablet 10 mg, 10 mg, Oral, Daily, Lorretta Harp, MD, 10 mg at 04/23/23 0953   fentaNYL (DURAGESIC) 75 MCG/HR 1 patch, 1 patch, Transdermal, Q72H, Lorretta Harp, MD, 1 patch at 04/23/23 0951   heparin ADULT infusion 100 units/mL (25000 units/25mL), 750 Units/hr, Intravenous, Continuous, Hunt, Madison H, RPH, Last Rate: 7.5 mL/hr at 04/23/23 1446, 750 Units/hr at 04/23/23 1446   ibuprofen (ADVIL) tablet 200 mg, 200 mg, Oral, Q6H PRN, Lorretta Harp, MD, 200 mg at 04/22/23 2254   loperamide (IMODIUM) capsule 2 mg, 2 mg, Oral, BID PRN, Lorretta Harp, MD   oxyCODONE (Oxy IR/ROXICODONE) immediate release tablet 5 mg, 5 mg, Oral, Q6H PRN, Lorretta Harp, MD   pantoprazole (PROTONIX) EC tablet 40 mg, 40 mg, Oral, Daily, Lorretta Harp,  MD   phenylephrine ((USE for PREPARATION-H)) 0.25 % suppository 1 suppository, 1 suppository, Rectal, PRN, Lorretta Harp, MD   pregabalin (LYRICA) capsule 150 mg, 150 mg, Oral, BID, Lorretta Harp, MD, 150 mg at 04/23/23 1610   traZODone (DESYREL) tablet 25-50 mg, 25-50 mg, Oral, QHS PRN, Lorretta Harp, MD, 50 mg at 04/22/23 2254   [START ON 04/24/2023] vancomycin (VANCOCIN) IVPB 1000 mg/200 mL premix, 1,000 mg, Intravenous, Q36H, Meegan, Eryn, RPH  Current Outpatient Medications:    apixaban (ELIQUIS) 2.5 MG TABS tablet, Take 2.5 mg by mouth 2 (two) times daily., Disp: , Rfl:    aspirin 81 MG chewable tablet, Chew 1 tablet (81 mg total) by mouth daily for 10 days., Disp: , Rfl:    atorvastatin (LIPITOR) 80 MG tablet, Take 80 mg by mouth daily., Disp: , Rfl:    carvedilol (COREG) 6.25 MG tablet, Take 1 tablet (6.25 mg total) by mouth 2 (two) times daily. (Patient taking differently: Take 12.5 mg by mouth 2 (two) times daily.), Disp: 180 tablet, Rfl: 3   cephALEXin (KEFLEX) 500 MG capsule, Take 1 capsule (500 mg total) by mouth 3 (three) times daily., Disp: 21 capsule, Rfl: 0   clopidogrel (PLAVIX) 75 MG tablet, Take 1 tablet (75 mg total) by mouth daily., Disp: 90 tablet, Rfl: 1    ezetimibe (ZETIA) 10 MG tablet, TAKE 1 TABLET(10 MG) BY MOUTH DAILY, Disp: 90 tablet, Rfl: 0   famotidine (PEPCID) 40 MG tablet, Take 1 tablet (40 mg total) by mouth daily. (Patient taking differently: Take 40 mg by mouth daily as needed for indigestion or heartburn.), Disp: 90 tablet, Rfl: 0   fentaNYL (DURAGESIC) 25 MCG/HR, Place 2 patches onto the skin every 3 (three) days., Disp: 20 patch, Rfl: 0   isosorbide mononitrate (IMDUR) 60 MG 24 hr tablet, Take 1 tablet (60 mg total) by mouth daily., Disp: 30 tablet, Rfl: 0   pantoprazole (PROTONIX) 40 MG tablet, Take 1 tablet (40 mg total) by mouth daily., Disp: 30 tablet, Rfl: 0   pregabalin (LYRICA) 150 MG capsule, Take 1 capsule (150 mg total) by mouth 2 (two) times daily., Disp: 180 capsule, Rfl: 1   shark liver oil-cocoa butter (PREPARATION H) 0.25-3-85.5 % suppository, Place 1 suppository rectally as needed for hemorrhoids., Disp: , Rfl:    traZODone (DESYREL) 50 MG tablet, Take 0.5-1 tablets (25-50 mg total) by mouth at bedtime as needed for sleep., Disp: 30 tablet, Rfl: 3   valsartan (DIOVAN) 160 MG tablet, Take 160 mg by mouth in the morning and at bedtime., Disp: , Rfl:     ALLERGIES   Patient has no known allergies.     REVIEW OF SYSTEMS    Review of Systems:  Gen:  Denies  fever, sweats, chills weigh loss  HEENT: Denies blurred vision, double vision, ear pain, eye pain, hearing loss, nose bleeds, sore throat Cardiac:  No dizziness, chest pain or heaviness, chest tightness,edema Resp:   reports dyspnea chronically  Gi: Denies swallowing difficulty, stomach pain, nausea or vomiting, diarrhea, constipation, bowel incontinence Gu:  Denies bladder incontinence, burning urine Ext:   Denies Joint pain, stiffness or swelling Skin: Denies  skin rash, easy bruising or bleeding or hives Endoc:  Denies polyuria, polydipsia , polyphagia or weight change Psych:   Denies depression, insomnia or hallucinations   Other:  All other systems  negative   VS: BP (!) 143/60   Pulse 88   Temp 98 F (36.7 C) (Oral)   Resp (!) 21  Ht 5' 2.99" (1.6 m)   Wt 54.4 kg   SpO2 100%   BMI 21.25 kg/m      PHYSICAL EXAM    GENERAL:NAD, no fevers, chills, no weakness no fatigue HEAD: Normocephalic, atraumatic.  EYES: Pupils equal, round, reactive to light. Extraocular muscles intact. No scleral icterus.  MOUTH: Moist mucosal membrane. Dentition intact. No abscess noted.  EAR, NOSE, THROAT: Clear without exudates. No external lesions.  NECK: Supple. No thyromegaly. No nodules. No JVD.  PULMONARY: decreased breath sounds with mild rhonchi worse at bases bilaterally.  CARDIOVASCULAR: S1 and S2. Regular rate and rhythm. No murmurs, rubs, or gallops. No edema. Pedal pulses 2+ bilaterally.  GASTROINTESTINAL: Soft, nontender, nondistended. No masses. Positive bowel sounds. No hepatosplenomegaly.  MUSCULOSKELETAL: No swelling, clubbing, or edema. Range of motion full in all extremities.  NEUROLOGIC: Cranial nerves II through XII are intact. No gross focal neurological deficits. Sensation intact. Reflexes intact.  SKIN: No ulceration, lesions, rashes, or cyanosis. Skin warm and dry. Turgor intact.  PSYCHIATRIC: Mood, affect within normal limits. The patient is awake, alert and oriented x 3. Insight, judgment intact.       IMAGING     ASSESSMENT/PLAN   Complete atelectasis of left lung   - patient s/p diuresis with partial improvement however still has minimal lung sounds on left.   - she does have CKD 3 and may not produce adequate fluid removal via medication alone   - we discussed options for thoracentesis and patient would like to proceed with this procedure.   - IR consultation placed for therapeutic tap -incentive spirometry at bedside to be used multiple times daily  - PT/OT - etiology is likely poor nutrition post ICU hospitalization with CHF and CKD all contributing to third spaced interstitial edema , peripheral edema and  effusion      Bilateral pleural effusions    - s/p throacentesis Sept 2024   - consultation for IR with US guided throacentesis - therapeutic    -patient on heparin drip   Sepsis due to right groin cellulitis   S/p vascular surgery eval S/p vanco /rocephin  - patient appears to be clinically improved.     Thank you for allowing me to participate in the care of this patient.   Patient/Family are satisfied with care plan and all questions have been answered.    Provider disclosure: Patient with at least one acute or chronic illness or injury that poses a threat to life or bodily function and is being managed actively during this encounter.  All of the below services have been performed independently by signing provider:  review of prior documentation from internal and or external health records.  Review of previous and current lab results.  Interview and comprehensive assessment during patient visit today. Review of current and previous chest radiographs/CT scans. Discussion of management and test interpretation with health care team and patient/family.   This document was prepared using Dragon voice recognition software and may include unintentional dictation errors.     Vida Rigger, M.D.  Division of Pulmonary & Critical Care Medicine

## 2023-04-23 NOTE — ED Notes (Signed)
Provider at bedside. NP changing dressing to R groin and obtaining wound culture.

## 2023-04-23 NOTE — ED Notes (Addendum)
Dressing intact, reinforced. Drainage present.

## 2023-04-23 NOTE — Progress Notes (Signed)
Peripherally Inserted Central Catheter Placement  The IV Nurse has discussed with the patient and/or persons authorized to consent for the patient, the purpose of this procedure and the potential benefits and risks involved with this procedure.  The benefits include less needle sticks, lab draws from the catheter, and the patient may be discharged home with the catheter. Risks include, but not limited to, infection, bleeding, blood clot (thrombus formation), and puncture of an artery; nerve damage and irregular heartbeat and possibility to perform a PICC exchange if needed/ordered by physician.  Alternatives to this procedure were also discussed.  Bard Power PICC patient education guide, fact sheet on infection prevention and patient information card has been provided to patient /or left at bedside.    PICC Placement Documentation  PICC Double Lumen 04/23/23 Right Brachial 37 cm 0 cm (Active)  Indication for Insertion or Continuance of Line Poor Vasculature-patient has had multiple peripheral attempts or PIVs lasting less than 24 hours 04/23/23 1634  Exposed Catheter (cm) 0 cm 04/23/23 1634  Site Assessment Clean, Dry, Intact;Bruised 04/23/23 1634  Lumen #1 Status Flushed;Saline locked;Blood return noted 04/23/23 1634  Lumen #2 Status Flushed;Saline locked;Blood return noted 04/23/23 1634  Dressing Type Transparent;Securing device 04/23/23 1634  Dressing Status Antimicrobial disc in place;Clean, Dry, Intact 04/23/23 1634  Line Care Connections checked and tightened 04/23/23 1634  Line Adjustment (NICU/IV Team Only) No 04/23/23 1634  Dressing Intervention New dressing;Adhesive placed at insertion site (IV team only);Other (Comment) 04/23/23 1634  Dressing Change Due 04/30/23 04/23/23 1634    Patient's R Arm is already bruised and swollen prior to PICC insertion.   Annett Fabian 04/23/2023, 4:36 PM

## 2023-04-23 NOTE — Progress Notes (Signed)
Progress Note    04/23/2023 11:06 AM * No surgery date entered *  Subjective:  Dawn Bruce is a 77 yo female who presents to [facility] emergency room with post operative right groin infection. Patient was seen by Dr Myra Gianotti yesterday. Today at the bedside are the patients daughter and husband. We had a long discussion about the reasoning behind the infection and the treatment. Patient endorses pain to her groin. She also endorses odor. Right groin was packed with guaze yesterday and covered with an ABD pad. Husband questions patient having her right lung drained at the same time we are to fix her groin. Currently I am unaware of this needing to be done. I will inquire with Dr Gilda Crease. No other complaints. Vitals all remain stable.     Vitals:   04/23/23 1000 04/23/23 1030  BP: (!) 188/79 (!) 175/78  Pulse: 98 94  Resp: 19   Temp:    SpO2: 99% 98%   Physical Exam: Cardiac:  Hx of Atrial Fibrillation. Regular Rate  Lungs:  Left Lung clear on auscultation. Right lung diminished breath sounds 1/2 way. Mild Dyspnea with couch. No wheezing noted.  Incisions:  Right groin with noted serous/pus drainage. Staples remain with erythema along the incision line.  Extremities:  Bilateral lower extremities warm to touch. Palpable pulses. Noted +1 edema to right lower extremity.  Abdomen:  Positive bowel sounds throughout, soft and non tender and non distended.  Neurologic: AAOX4, Follows commands.   CBC    Component Value Date/Time   WBC 14.5 (H) 04/23/2023 0631   RBC 3.43 (L) 04/23/2023 0631   HGB 9.4 (L) 04/23/2023 0631   HGB 13.6 06/01/2022 0000   HCT 29.1 (L) 04/23/2023 0631   HCT 40.5 06/01/2022 0000   PLT 320 04/23/2023 0631   PLT 132 (L) 06/01/2022 0000   MCV 84.8 04/23/2023 0631   MCV 90 06/01/2022 0000   MCH 27.4 04/23/2023 0631   MCHC 32.3 04/23/2023 0631   RDW 17.3 (H) 04/23/2023 0631   RDW 12.8 06/01/2022 0000   LYMPHSABS 0.7 04/22/2023 1528   LYMPHSABS 0.7 08/30/2021 0917    MONOABS 1.2 (H) 04/22/2023 1528   EOSABS 0.2 04/22/2023 1528   EOSABS 0.0 08/30/2021 0917   BASOSABS 0.0 04/22/2023 1528   BASOSABS 0.0 08/30/2021 0917    BMET    Component Value Date/Time   NA 141 04/23/2023 0631   NA 147 (H) 06/01/2022 0000   K 3.5 04/23/2023 0631   CL 109 04/23/2023 0631   CO2 22 04/23/2023 0631   GLUCOSE 97 04/23/2023 0631   BUN 23 04/23/2023 0631   BUN 23 06/01/2022 0000   CREATININE 1.02 (H) 04/23/2023 0631   CALCIUM 7.9 (L) 04/23/2023 0631   GFRNONAA 56 (L) 04/23/2023 0631   GFRAA 54 (L) 10/08/2019 1419    INR    Component Value Date/Time   INR 1.5 (H) 04/22/2023 1540     Intake/Output Summary (Last 24 hours) at 04/23/2023 1106 Last data filed at 04/22/2023 1832 Gross per 24 hour  Intake 1300 ml  Output 25 ml  Net 1275 ml     Assessment/Plan:  77 y.o. female is s/p Right groin endarterectomy with repair post cardiac catheterization hemorrhage. Now with infection    * No surgery date entered *   PLAN: Dressings changed today at the bedside. Change packing Q 8 hrs.  Wound cultured at the bedside today per Hospitalist request.   Vascular surgery plans on taking the patient to the  operating room for wound debridement and washout with wound VAC placement on Wednesday, 04/25/2023.  I discussed in detail today with the patient, her husband, and her daughter at the bedside the procedure, benefits, risks, and complications.  All verbalized her understanding wish to proceed as soon as possible.  I answered all her questions today.  Patient will be made n.p.o. after midnight on Wednesday for the surgery.  DVT prophylaxis: Heparin infusion.  I discussed the plan in detail with Dr. [name] MD and he agrees with the plan.   [name] [facility] 04/23/2023 11:06 AM

## 2023-04-23 NOTE — Progress Notes (Signed)
PROGRESS NOTE  Dawn Bruce    DOB: May 21, 1946, 77 y.o.  JYN:[id number]    Code Status: Full Code   DOA: 04/22/2023   LOS: 1   Brief hospital course  Dawn Bruce is a 77 y.o. female with medical history significant of dCHF, CAD (s/p of recent DES), A fib on Eliquis, HTN, HLD, CDK-3a, chronic pain syndrome, multiple skin cancer, perforated appendicitis, former smoker, who presents with right groin pain.   Patient was recently hospitalized from 11/21 - 11/29 due to non-STEMI.  Patient is s/p of LHC with DES placement. Pt developed retroperitoneal hematoma & hemorrhagic shock due to complication of Iliac artery laceration during left heart cath of right groin. Pt underwent surgical repair of external iliac artery by [facility] on 11/23.   Presented back to ED due to increased redness, swelling, and pain at incision site of the cath. Failed outpatient Abx x1 dose.  Hypotensive to 80/50 on presentation.    ED Course: pt was found to have WBC 21.3, BNP 626, stable renal function, potassium 3.3, Mg 2.3, phosphorus 2.7, lactic acid of 1.7, C diff negative. Temperature normal, blood pressure 141/74, heart rate 104, RR 29, oxygen saturation 98% on room air.   Dr. Myra Gianotti of [facility] is consulted by EDP.   CXR: Interval development of moderate size left pleural effusion with probable underlying atelectasis or infiltrate.   CT-abd/pelvis: Moderate sized right retroperitoneal hematoma is noted which extends into right pelvic region and displaces urinary bladder to the left. The pelvic component appears to be mildly enlarged compared to prior exam.   Bilateral pleural effusions are noted, left greater than right, with associated atelectasis of the lower lobes. Likely from her HF.    EKG:  QTc 532, T wave inversion in inferior leads and V2-V6  They were initially treated with potassium, CTX, heparin gtt, IV fluids, vancomycin, ibuprofen.   Patient was admitted to medicine service for further workup and  management of cellulitis as outlined in detail below.  04/23/23 -vitals stable. Surgical incision has significant erythema and drainage. Pain improved.   Assessment & Plan  Principal Problem:   Cellulitis of right groin Active Problems:   Sepsis (HCC)   CAD (coronary artery disease)   Peritoneal hematoma   Essential hypertension   Chronic diastolic heart failure (HCC)   Hypokalemia   Chronic kidney disease, stage 3a (HCC)   Pleural effusion   Abnormal LFTs   Atrial fibrillation, chronic (HCC)   Diarrhea   Chronic pain syndrome   HLD (hyperlipidemia)  Sepsis due to cellulitis of right groin: sepsis criteria met on admission with WBC 21.3, heart rate 104, RR 29.  Lactic acid normal 1.7.  infection source is superficial groin. Dr. Myra Gianotti of [facility] removed several staples, probed and packed wound of right groin and she was started on IV Abx.  Today, WBC improved 21>14.5, she remains afebrile. Wound has significant foul smell, saturated packing. Endorses mild improvement in pain since presentation. BxCx NGTD. I spoke with [facility] team and asked for re-packing and a wound culture to guide antibiotics.  - increase CTX dose, continue vancomycin - follow wound culture, blood culture - follow [facility]. Likely debridement needed - analgesia PRN - consulted ID - suspect we will need CT vs Mri of groin to further evaluate depth of infection if not improving  Hypervolemia  Bilateral Pleural effusions- incidental findings- L worse than R. Denies respiratory symptoms. Had recent echo showing HFrEF. Stable ORA. Of note, had thoracentesis 9/22 - watch respiratory status  closely - trial of IV lasix. Also hypervolemic on exam with significant edema of extremities  - watch closely renal function.    Hx of CAD: s/p of recent DES. - hold the Plavix, and switch Eliquis to IV heparin. Restart as able -continue ASA -close monitoring of chest pain development.    Peritoneal hematoma: Hgb mild drop 10.3>9.4.  likely more dilutional. Blood loss from wound is minimal. - closely monitor on heparin gtt -Follow-up CBC am   Essential hypertension: Blood pressure stable -Continue Coreg which is also for A-fib - holding other home meds for transient hypotension   HFrEF- EF 25-30% on echo 11/24. Considerable edema of all extremities -Watch volume status closely s/p trial of IV diuretics   Hypokalemia: Potassium 3.3.  Magnesium 2.3, phosphorus 2.7 -Repleted potassium   Chronic kidney disease, stage 3a (HCC): Stable. Cr 1.02 today -Follow-up with BMP am    Abnormal LFTs: her liver function has been improving.  She has ALP 80, AST 54, ALT 83, total bilirubin 2.8, which is better than recent liver function. -Avoid using Tylenol   Atrial fibrillation, chronic (HCC): Heart rate 104 -Switch Eliquis to IV heparin -Continue Coreg   Diarrhea: C. difficile negative. -Check GI pathogen panel -As needed Imodium   Chronic pain syndrome -Continue home fentanyl patch and Lyrica   HLD: -lipitor and zetia  Body mass index is 21.25 kg/m.  VTE ppx: heparin gtt  Diet:     Diet   Diet NPO time specified Except for: Sips with Meds, Ice Chips   Consultants: Vascular surgery ID  Subjective 04/23/23    Pt reports having improved pain when not agitating the wound. She is apprehensive of procedures due to the hematoma she experienced previously. Endorses good distal sensation to wound.    Objective   Vitals:   04/23/23 0530 04/23/23 0600 04/23/23 0636 04/23/23 0700  BP: (!) 109/51 (!) 150/69  (!) 160/72  Pulse: 88 92  92  Resp: 16 17  20   Temp:   98 F (36.7 C)   TempSrc:   Oral   SpO2: 92% 96%  98%  Weight:      Height:        Intake/Output Summary (Last 24 hours) at 04/23/2023 0801 Last data filed at 04/22/2023 1832 Gross per 24 hour  Intake 1300 ml  Output 25 ml  Net 1275 ml   Filed Weights   04/22/23 2200  Weight: 54.4 kg     Physical Exam:  General: awake, alert, NAD HEENT:  atraumatic, clear conjunctiva, anicteric sclera, MMM, hearing grossly normal Respiratory: normal respiratory effort. Cardiovascular: quick capillary refill, normal S1/S2, RRR, no JVD, murmurs Gastrointestinal: soft, NT, ND Nervous: A&O x3. no gross focal neurologic deficits, normal speech Extremities: 1+ pitting edema of all extremities Skin: R groin- staples in place, granulation tissue vs purulence drainage. Significant serosanguinous drainage. Foul odor. Erythema, increased temp extend beyond wound to mid thigh and mid abdomen.  Psychiatry: normal mood, congruent affect  Labs   I have personally reviewed the following labs and imaging studies CBC    Component Value Date/Time   WBC 14.5 (H) 04/23/2023 0631   RBC 3.43 (L) 04/23/2023 0631   HGB 9.4 (L) 04/23/2023 0631   HGB 13.6 06/01/2022 0000   HCT 29.1 (L) 04/23/2023 0631   HCT 40.5 06/01/2022 0000   PLT 320 04/23/2023 0631   PLT 132 (L) 06/01/2022 0000   MCV 84.8 04/23/2023 0631   MCV 90 06/01/2022 0000   MCH 27.4  04/23/2023 0631   MCHC 32.3 04/23/2023 0631   RDW 17.3 (H) 04/23/2023 0631   RDW 12.8 06/01/2022 0000   LYMPHSABS 0.7 04/22/2023 1528   LYMPHSABS 0.7 08/30/2021 0917   MONOABS 1.2 (H) 04/22/2023 1528   EOSABS 0.2 04/22/2023 1528   EOSABS 0.0 08/30/2021 0917   BASOSABS 0.0 04/22/2023 1528   BASOSABS 0.0 08/30/2021 0917      Latest Ref Rng & Units 04/23/2023    6:31 AM 04/22/2023    3:28 PM 04/20/2023    4:11 AM  BMP  Glucose 70 - 99 mg/dL 97  161  096   BUN 8 - 23 mg/dL 23  30  22    Creatinine 0.44 - 1.00 mg/dL 0.45  4.09  8.11   Sodium 135 - 145 mmol/L 141  137  137   Potassium 3.5 - 5.1 mmol/L 3.5  3.3  3.1   Chloride 98 - 111 mmol/L 109  104  103   CO2 22 - 32 mmol/L 22  27  23    Calcium 8.9 - 10.3 mg/dL 7.9  8.0  8.1     CT ABDOMEN PELVIS W CONTRAST  Result Date: 04/22/2023 CLINICAL DATA:  Right lower quadrant abdominal pain. EXAM: CT ABDOMEN AND PELVIS WITH CONTRAST TECHNIQUE: Multidetector CT  imaging of the abdomen and pelvis was performed using the standard protocol following bolus administration of intravenous contrast. RADIATION DOSE REDUCTION: This exam was performed according to the departmental dose-optimization program which includes automated exposure control, adjustment of the mA and/or kV according to patient size and/or use of iterative reconstruction technique. CONTRAST:  75mL OMNIPAQUE IOHEXOL 300 MG/ML  SOLN COMPARISON:  April 13, 2023. FINDINGS: Lower chest: Large left pleural effusion is noted with small right pleural effusion, with adjacent atelectasis of the lower lobes. Hepatobiliary: No focal liver abnormality is seen. No gallstones, gallbladder wall thickening, or biliary dilatation. Pancreas: Unremarkable. No pancreatic ductal dilatation or surrounding inflammatory changes. Spleen: Normal in size without focal abnormality. Adrenals/Urinary Tract: Adrenal glands and kidneys appear normal. No hydronephrosis or renal obstruction is noted. Urinary bladder is displaced to the left by moderate size right-sided pelvic hematoma which appears to be slightly enlarged compared to prior exam. Stomach/Bowel: The stomach is unremarkable. There is no evidence of bowel obstruction or inflammation. Status post appendectomy. Vascular/Lymphatic: Aortic atherosclerosis. No enlarged abdominal or pelvic lymph nodes. Reproductive: Status post hysterectomy. No adnexal masses. Other: As noted above, moderate size right-sided retroperitoneal hematoma is noted which extends into right pelvic region, where it is mildly enlarged. Subcutaneous gas is noted in the right lower quadrant suggesting postsurgical change. Musculoskeletal: No acute or significant osseous findings. IMPRESSION: Moderate sized right retroperitoneal hematoma is noted which extends into right pelvic region and displaces urinary bladder to the left. The pelvic component appears to be mildly enlarged compared to prior exam. Bilateral  pleural effusions are noted, left greater than right, with associated atelectasis of the lower lobes. Electronically Signed   By: Lupita Raider M.D.   On: 04/22/2023 17:45   DG Chest Port 1 View  Result Date: 04/22/2023 CLINICAL DATA:  Possible wound infection.  Hypotension. EXAM: PORTABLE CHEST 1 VIEW COMPARISON:  April 16, 2023. FINDINGS: Interval development of moderate size left pleural effusion with probable underlying atelectasis or pneumonia. Right lung is clear. IMPRESSION: Interval development of moderate size left pleural effusion with probable underlying atelectasis or infiltrate. Electronically Signed   By: Lupita Raider M.D.   On: 04/22/2023 16:49    Disposition  Plan & Communication  Patient status: Inpatient  Admitted From: Home Planned disposition location: Home Anticipated discharge date: TBD pending clinical improvement  Family Communication: spouse and daughter at bedside    Author: Leeroy Bock, DO [facility] 04/23/2023, 8:01 AM   Available by Epic secure chat 7AM-7PM. If 7PM-7AM, please contact night-coverage.  [facility] contact information found on [email or web address].

## 2023-04-23 NOTE — Progress Notes (Signed)
VAST consult received to obtain 2nd PIV access as patient is receiving heparin drip and needs antibiotics as well. Pt with left arm restricted d/t previous injury and nerve damage in lower arm. Assessed patient's right arm utilizing ultrasound. No appropriate vessels noted in lower right arm. Upon removing blood pressure cuff from upper right arm, noted PIV in upper right arm bleeding. While attempting to clean site and redress IV, accidentally dislodged IV. Assessed upper right arm with ultrasound; vessels noted not appropriate for USGIV, however, appropriate for PICC placement. Advised ER RN to speak with physician and request PICC placement. Since IV access currently needed, patient agreed to temporary IV placement in upper left arm. USGIV placed in basilic vessel on first attempt without difficulty. Advised ER RN and patient to request removal of left upper arm IV at any signs of swelling or burning and also with placement of PICC.

## 2023-04-23 NOTE — Progress Notes (Signed)
Pharmacy Antibiotic Note  Dawn Bruce is a 77 y.o. female admitted on 04/22/2023 with cellulitis. History of hypertension, recent NSTEMI with left heart cath on 04/13/2023 complicated by iliac artery laceration with retroperitoneal hematoma requiring exploration and repair by vascular surgery who was discharged home 2 days ago, yesterday started noticing redness swelling increased pain and fell shoulder at her right groin incision. Vascular surgery called in Keflex for her, she took 1 dose yesterday. This morning, she felt very weak and lightheaded, called EMS who reported an initial blood pressure of 80/50 on first check but which normalized afterward prior to IV fluids. Patient reports she continues to feel unwell  Pharmacy has been consulted for vancomycin dosing.  Today, 04/24/2023 Day 2 of antibiotics  WBC down trending; 21.3 > 14.5  Afebrile  Lactic acid 1.7 CRP 20.7 and ESR 60 Renal function improving   Plan: Adjust vancomycin dose to 1000 mg IV Q36H Goal AUC: 400-550 Expected AUC: 484.8 Expected Css min: 10.4 SCr used: 1.02 Weight used: IBW, Vd used: 0.72  Patient also receiving ceftriaxone 2 gm IV Q24H Continue to monitor renal function and follow culture results  Height: 5' 2.99" (160 cm) Weight: 54.4 kg (119 lb 14.9 oz) IBW/kg (Calculated) : 52.38  Temp (24hrs), Avg:98.5 F (36.9 C), Min:98 F (36.7 C), Max:99.7 F (37.6 C)  Recent Labs  Lab 04/18/23 0320 04/19/23 0600 04/20/23 0411 04/22/23 1528 04/23/23 0631  WBC 14.0* 12.4* 19.4* 21.3* 14.5*  CREATININE 1.70*  1.62* 1.18* 1.06* 1.17* 1.02*  LATICACIDVEN  --   --   --  1.7  --     Estimated Creatinine Clearance: 37 mL/min (A) (by C-G formula based on SCr of 1.02 mg/dL (H)).   No Known Allergies  Antimicrobials this admission: 12/1 Vanc >>  12/1 Ceftriaxone >>   Dose adjustments this admission:  Microbiology results: 12/1 BCx: NGTD 12/1 BCx: NGTD  Thank you for allowing pharmacy to be a part of this  patient's care.  Littie Deeds, PharmD Pharmacy Resident  04/23/2023 11:08 AM

## 2023-04-23 NOTE — Progress Notes (Signed)
PHARMACY - ANTICOAGULATION CONSULT NOTE  Pharmacy Consult for Heparin Infusion Indication: atrial fibrillation  No Known Allergies  Patient Measurements: Height: 5' 2.99" (160 cm) Weight: 54.4 kg (119 lb 14.9 oz) IBW/kg (Calculated) : 52.38 Heparin Dosing Weight: 54.4 kg  Vital Signs: Temp: 98 F (36.7 C) (12/02 0636) Temp Source: Oral (12/02 0636) BP: 175/78 (12/02 1030) Pulse Rate: 94 (12/02 1030)  Labs: Recent Labs    04/22/23 1528 04/22/23 1540 04/22/23 2259 04/23/23 0631  HGB 10.3*  --   --  9.4*  HCT 32.4*  --   --  29.1*  PLT 365  --   --  320  APTT  --  43*  --  85*  LABPROT  --  18.2*  --   --   INR  --  1.5*  --   --   HEPARINUNFRC  --   --  >1.10* >1.10*  CREATININE 1.17*  --   --  1.02*   Estimated Creatinine Clearance: 37 mL/min (A) (by C-G formula based on SCr of 1.02 mg/dL (H)).  Medical History: Past Medical History:  Diagnosis Date   Actinic keratosis    Chronic pain    GERD (gastroesophageal reflux disease) ?   Taking Pepcid   History of basal cell carcinoma (BCC) 05/02/2021   right upper back paraspinal   History of SCC (squamous cell carcinoma) of skin 09/10/2019   left dorsum wrist ED&C done 10/28/19   History of SCC (squamous cell carcinoma) of skin 03/04/2020   right dorsum hand   History of squamous cell carcinoma in situ (SCCIS) 03/04/2020   left dorsum wrist  ED&C   History of squamous cell carcinoma in situ (SCCIS) 03/04/2020   right medial infraorbital  ED&C 04/28/2020   History of squamous cell carcinoma in situ (SCCIS) 10/28/2019   right dorsum hand proximal lateral   History of squamous cell carcinoma in situ (SCCIS) 10/28/2019   right dorsum hand proximal medial   Hyperlipidemia    Hypertension    NSTEMI (non-ST elevated myocardial infarction) (HCC) 04/12/2023   Renal disorder    Squamous cell carcinoma in situ 10/31/2021   left distal  tricep, EDC   Squamous cell carcinoma of skin 08/28/2022   Right volar forearm - EDC    Assessment: Patient is a 77 year old female with a past medical history of hypertension, recent NSTEMI with left heart cath on 04/13/2023 complicated by iliac artery laceration with retroperitoneal hematoma requiring exploration and repair by vascular surgery who was discharged home 2 days ago. Now presents to hospital with redness, swelling, and increased pain at her right groin incision. Team wants to transition patient from her apixaban to IV heparin for possible surgical debridement. Last dose of apixaban unknown but at minimum prior to 1500 when she was admitted. Baseline INR 1.5 and aPTT 43. Baseline heparin level ordered. Hgb 10.3. PLT 365. No signs/symptoms of bleeding noted.  Goal of Therapy:  Heparin level 0.3-0.7 units/ml aPTT 66-102 seconds Monitor platelets by anticoagulation protocol: Yes  Labs:  1202 0631 aPTT 85 / HL > 1.10 - aPTT level therapeutic x 1    Plan:  Continue heparin infusion at a rate of 750 units/hr Recheck aPTT level in 8 hours Monitor CBC and heparin level daily Continue to monitor via aPTT levels until heparin level correlates  Littie Deeds, PharmD Pharmacy Resident  04/23/2023 10:57 AM

## 2023-04-23 NOTE — Consult Note (Signed)
NAME: Dawn Bruce  DOB: December 12, 1945  MRN: [id number]  Date/Time: 04/23/2023 4:55 PM  REQUESTING PROVIDER: Lear Ng Subjective:  REASON FOR CONSULT: infection groin wound ? Dawn Bruce is a 77 y.o. female with a history of CAD, HTN, LD was recently in [facility] between 11/21-11/29 for chest pain and found to have STEMI, underwent angio on 11/22 and ostial LADstent placement- complicated by retroperitoneal hematoma, hemorrhagic shock needing  repair of rt external iliac and circumflex artery by Dr.Esco on 11/222/4. She also received PRBC and was in ICU- She was discharge don 04/20/23 and called vascular who prescribed keflex on 11/30. She came back to the ED on 12/1 with infected wound rt groin. Culture sent and was started on Iv vanco and ceftriaxone and I am asked to see the patient. She has no fever Vitals in the ED  04/22/23  BP 163/74 !  Temp 99.7 F (37.6 C)  Pulse Rate 96  Resp 22 !  SpO2 98 %   Labs  Latest Reference Range & Units 04/22/23  WBC 4.0 - 10.5 K/uL 21.3 (H)  Hemoglobin 12.0 - 15.0 g/dL 13.2 (L)  HCT 44.0 - 10.2 % 32.4 (L)  Platelets 150 - 400 K/uL 365  Creatinine 0.44 - 1.00 mg/dL 7.25 (H)   Ct abdomen shows Moderate sized right retroperitoneal hematoma   Past Medical History:  Diagnosis Date   Actinic keratosis    Chronic pain    GERD (gastroesophageal reflux disease) ?   Taking Pepcid   History of basal cell carcinoma (BCC) 05/02/2021   right upper back paraspinal   History of SCC (squamous cell carcinoma) of skin 09/10/2019   left dorsum wrist ED&C done 10/28/19   History of SCC (squamous cell carcinoma) of skin 03/04/2020   right dorsum hand   History of squamous cell carcinoma in situ (SCCIS) 03/04/2020   left dorsum wrist  ED&C   History of squamous cell carcinoma in situ (SCCIS) 03/04/2020   right medial infraorbital  ED&C 04/28/2020   History of squamous cell carcinoma in situ (SCCIS) 10/28/2019   right dorsum hand proximal lateral   History of  squamous cell carcinoma in situ (SCCIS) 10/28/2019   right dorsum hand proximal medial   Hyperlipidemia    Hypertension    NSTEMI (non-ST elevated myocardial infarction) (HCC) 04/12/2023   Renal disorder    Squamous cell carcinoma in situ 10/31/2021   left distal  tricep, EDC   Squamous cell carcinoma of skin 08/28/2022   Right volar forearm - EDC    Past Surgical History:  Procedure Laterality Date   ABDOMINAL HYSTERECTOMY     APPENDECTOMY  April 2023   Burst appendix   AUGMENTATION MAMMAPLASTY Bilateral    25-30 years ago   CARDIOVERSION N/A 12/01/2020   Procedure: CARDIOVERSION;  Surgeon: Debbe Odea, MD;  Location: [facility] ORS;  Service: Cardiovascular;  Laterality: N/A;   CAROTID ARTERY ANGIOPLASTY Right 1995(approximate)   CAROTID ENDARTERECTOMY  2001   CORONARY STENT INTERVENTION N/A 04/13/2023   Procedure: CORONARY STENT INTERVENTION;  Surgeon: Marcina Millard, MD;  Location: [facility] INVASIVE CV LAB;  Service: Cardiovascular;  Laterality: N/A;   COSMETIC SURGERY     ELBOW SURGERY Left    ENDARTERECTOMY FEMORAL Right 04/13/2023   Procedure: Right groin exploration with repair of right external iliac artery and right circumflex artery;  Surgeon: Bertram Denver, MD;  Location: [facility] ORS;  Service: Vascular;  Laterality: Right;   FRACTURE SURGERY  2016   Arm   LEFT HEART CATH  AND CORONARY ANGIOGRAPHY N/A 04/13/2023   Procedure: LEFT HEART CATH AND CORONARY ANGIOGRAPHY;  Surgeon: Marcina Millard, MD;  Location: [facility] INVASIVE CV LAB;  Service: Cardiovascular;  Laterality: N/A;   THORACENTESIS N/A 12/27/2020   Procedure: Alanson Puls;  Surgeon: Josephine Igo, DO;  Location: [facility];  Service: Pulmonary;  Laterality: N/A;   TONSILLECTOMY     TUBAL LIGATION  ?    Social History   Socioeconomic History   Marital status: Married    Spouse name: Not on file   Number of children: 3   Years of education: Not on file   Highest education level: Some college, no  degree  Occupational History   Occupation: retired  Tobacco Use   Smoking status: Former    Current packs/day: 0.00    Average packs/day: 0.3 packs/day for 15.0 years (3.8 ttl pk-yrs)    Types: Cigarettes    Start date: 23    Quit date: 1990    Years since quitting: 34.9    Passive exposure: Past   Smokeless tobacco: Never   Tobacco comments:    quit around 1990-1995 (?)  Vaping Use   Vaping status: Never Used  Substance and Sexual Activity   Alcohol use: Yes    Alcohol/week: 14.0 standard drinks of alcohol    Types: 14 Standard drinks or equivalent per week    Comment: 2 scotch shots   Drug use: Never   Sexual activity: Not Currently    Birth control/protection: None  Other Topics Concern   Not on file  Social History Narrative   Not on file   Social Determinants of Health   Financial Resource Strain: Low Risk  (10/13/2022)   Overall Financial Resource Strain (CARDIA)    Difficulty of Paying Living Expenses: Not hard at all  Food Insecurity: No Food Insecurity (04/13/2023)   Hunger Vital Sign    Worried About Running Out of Food in the Last Year: Never true    Ran Out of Food in the Last Year: Never true  Transportation Needs: No Transportation Needs (04/13/2023)   PRAPARE - Administrator, Civil Service (Medical): No    Lack of Transportation (Non-Medical): No  Physical Activity: Insufficiently Active (10/13/2022)   Exercise Vital Sign    Days of Exercise per Week: 5 days    Minutes of Exercise per Session: 20 min  Stress: No Stress Concern Present (10/13/2022)   [employer] of Occupational Health - Occupational Stress Questionnaire    Feeling of Stress : Not at all  Social Connections: Unknown (10/13/2022)   Social Connection and Isolation Panel [NHANES]    Frequency of Communication with Friends and Family: More than three times a week    Frequency of Social Gatherings with Friends and Family: Once a week    Attends Religious Services: Not on  Insurance claims handler of Clubs or Organizations: No    Attends Banker Meetings: Never    Marital Status: Married  Catering manager Violence: Not At Risk (04/13/2023)   Humiliation, Afraid, Rape, and Kick questionnaire    Fear of Current or Ex-Partner: No    Emotionally Abused: No    Physically Abused: No    Sexually Abused: No    Family History  Problem Relation Age of Onset   Cancer Mother    Cancer Father    Cancer Sister    Breast cancer Neg Hx    No Known Allergies I? Current Facility-Administered Medications  Medication  Dose Route Frequency Provider Last Rate Last Admin   acetaminophen (TYLENOL) tablet 650 mg  650 mg Oral Q6H PRN Leeroy Bock, MD       albuterol (PROVENTIL) (2.5 MG/3ML) 0.083% nebulizer solution 2.5 mg  2.5 mg Nebulization Q4H PRN Otelia Sergeant, RPH       [START ON 04/24/2023] aspirin EC tablet 81 mg  81 mg Oral Daily Leeroy Bock, MD       atorvastatin (LIPITOR) tablet 80 mg  80 mg Oral Daily Lorretta Harp, MD   80 mg at 04/23/23 0953   carvedilol (COREG) tablet 12.5 mg  12.5 mg Oral BID Lorretta Harp, MD   12.5 mg at 04/23/23 1610   cefTRIAXone (ROCEPHIN) 2 g in sodium chloride 0.9 % 100 mL IVPB  2 g Intravenous Q24H Leeroy Bock, MD   Stopped at 04/23/23 1630   Chlorhexidine Gluconate Cloth 2 % PADS 6 each  6 each Topical Daily Leeroy Bock, MD       ezetimibe (ZETIA) tablet 10 mg  10 mg Oral Daily Lorretta Harp, MD   10 mg at 04/23/23 0953   fentaNYL (DURAGESIC) 75 MCG/HR 1 patch  1 patch Transdermal Cephus Richer, MD   1 patch at 04/23/23 0951   heparin ADULT infusion 100 units/mL (25000 units/275mL)  900 Units/hr Intravenous Continuous Meegan, Eryn, RPH 9 mL/hr at 04/23/23 1642 900 Units/hr at 04/23/23 1642   ibuprofen (ADVIL) tablet 200 mg  200 mg Oral Q6H PRN Lorretta Harp, MD   200 mg at 04/22/23 2254   loperamide (IMODIUM) capsule 2 mg  2 mg Oral BID PRN Lorretta Harp, MD       oxyCODONE (Oxy IR/ROXICODONE) immediate  release tablet 5 mg  5 mg Oral Q6H PRN Lorretta Harp, MD       pantoprazole (PROTONIX) EC tablet 40 mg  40 mg Oral Daily Lorretta Harp, MD       phenylephrine ((USE for PREPARATION-H)) 0.25 % suppository 1 suppository  1 suppository Rectal PRN Lorretta Harp, MD       pregabalin (LYRICA) capsule 150 mg  150 mg Oral BID Lorretta Harp, MD   150 mg at 04/23/23 0953   sodium chloride flush (NS) 0.9 % injection 10-40 mL  10-40 mL Intracatheter Q12H Leeroy Bock, MD       sodium chloride flush (NS) 0.9 % injection 10-40 mL  10-40 mL Intracatheter PRN Leeroy Bock, MD       traZODone (DESYREL) tablet 25-50 mg  25-50 mg Oral QHS PRN Lorretta Harp, MD   50 mg at 04/22/23 2254   [START ON 04/24/2023] vancomycin (VANCOCIN) IVPB 1000 mg/200 mL premix  1,000 mg Intravenous Q36H Meegan, Weston Settle, RPH       Current Outpatient Medications  Medication Sig Dispense Refill   apixaban (ELIQUIS) 2.5 MG TABS tablet Take 2.5 mg by mouth 2 (two) times daily.     aspirin 81 MG chewable tablet Chew 1 tablet (81 mg total) by mouth daily for 10 days.     atorvastatin (LIPITOR) 80 MG tablet Take 80 mg by mouth daily.     carvedilol (COREG) 6.25 MG tablet Take 1 tablet (6.25 mg total) by mouth 2 (two) times daily. (Patient taking differently: Take 12.5 mg by mouth 2 (two) times daily.) 180 tablet 3   cephALEXin (KEFLEX) 500 MG capsule Take 1 capsule (500 mg total) by mouth 3 (three) times daily. 21 capsule 0   clopidogrel (PLAVIX) 75 MG tablet  Take 1 tablet (75 mg total) by mouth daily. 90 tablet 1   ezetimibe (ZETIA) 10 MG tablet TAKE 1 TABLET(10 MG) BY MOUTH DAILY 90 tablet 0   famotidine (PEPCID) 40 MG tablet Take 1 tablet (40 mg total) by mouth daily. (Patient taking differently: Take 40 mg by mouth daily as needed for indigestion or heartburn.) 90 tablet 0   fentaNYL (DURAGESIC) 25 MCG/HR Place 2 patches onto the skin every 3 (three) days. 20 patch 0   isosorbide mononitrate (IMDUR) 60 MG 24 hr tablet Take 1 tablet (60 mg  total) by mouth daily. 30 tablet 0   pantoprazole (PROTONIX) 40 MG tablet Take 1 tablet (40 mg total) by mouth daily. 30 tablet 0   pregabalin (LYRICA) 150 MG capsule Take 1 capsule (150 mg total) by mouth 2 (two) times daily. 180 capsule 1   shark liver oil-cocoa butter (PREPARATION H) 0.25-3-85.5 % suppository Place 1 suppository rectally as needed for hemorrhoids.     traZODone (DESYREL) 50 MG tablet Take 0.5-1 tablets (25-50 mg total) by mouth at bedtime as needed for sleep. 30 tablet 3   valsartan (DIOVAN) 160 MG tablet Take 160 mg by mouth in the morning and at bedtime.       Abtx:  Anti-infectives (From admission, onward)    Start     Dose/Rate Route Frequency Ordered Stop   04/24/23 0500  vancomycin (VANCOCIN) IVPB 1000 mg/200 mL premix        1,000 mg 200 mL/hr over 60 Minutes Intravenous Every 36 hours 04/23/23 1144     04/23/23 1700  vancomycin (VANCOREADY) IVPB 500 mg/100 mL  Status:  Discontinued        500 mg 100 mL/hr over 60 Minutes Intravenous Every 24 hours 04/22/23 2046 04/23/23 1144   04/23/23 1600  cefTRIAXone (ROCEPHIN) 2 g in sodium chloride 0.9 % 100 mL IVPB        2 g 200 mL/hr over 30 Minutes Intravenous Every 24 hours 04/23/23 1201     04/23/23 1500  cefTRIAXone (ROCEPHIN) 2 g in sodium chloride 0.9 % 100 mL IVPB  Status:  Discontinued        2 g 200 mL/hr over 30 Minutes Intravenous Every 24 hours 04/22/23 2221 04/22/23 2221   04/23/23 1500  cefTRIAXone (ROCEPHIN) 1 g in sodium chloride 0.9 % 100 mL IVPB  Status:  Discontinued        1 g 200 mL/hr over 30 Minutes Intravenous Every 24 hours 04/22/23 2221 04/23/23 1201   04/22/23 1545  vancomycin (VANCOCIN) IVPB 1000 mg/200 mL premix        1,000 mg 200 mL/hr over 60 Minutes Intravenous  Once 04/22/23 1540 04/22/23 1827   04/22/23 1545  cefTRIAXone (ROCEPHIN) 2 g in sodium chloride 0.9 % 100 mL IVPB        2 g 200 mL/hr over 30 Minutes Intravenous  Once 04/22/23 1540 04/22/23 1704       REVIEW OF  SYSTEMS:  Const: negative fever, negative chills, negative weight loss Eyes: negative diplopia or visual changes, negative eye pain ENT: negative coryza, negative sore throat Resp: negative cough, hemoptysis, dyspnea Cards: negative for chest pain, palpitations, lower extremity edema GU: negative for frequency, dysuria and hematuria GI: Negative for abdominal pain, diarrhea, bleeding, constipation Skin: negative for rash and pruritus Heme: negative for easy bruising and gum/nose bleeding MS:  weakness Neurolo:negative for headaches, dizziness, vertigo, memory problems  Psych: negative for feelings of anxiety, depression  Endocrine: negative for  thyroid, diabetes Allergy/Immunology- negative for any medication or food allergies  Objective:  VITALS:  BP 92/76   Pulse 87   Temp 98.2 F (36.8 C) (Oral)   Resp 17   Ht 5' 2.99" (1.6 m)   Wt 54.4 kg   SpO2 97%   BMI 21.25 kg/m   PHYSICAL EXAM:  General: Alert, cooperative, no distress, appears stated age.  Head: Normocephalic, without obvious abnormality, atraumatic. Eyes: Conjunctivae clear, anicteric sclerae. Pupils are equal ENT Nares normal. No drainage or sinus tenderness. Lips, mucosa, and tongue normal. No Thrush Neck: Supple, symmetrical, no adenopathy, thyroid: non tender no carotid bruit and no JVD. Back: No CVA tenderness. Lungs: Clear to auscultation bilaterally. No Wheezing or Rhonchi. No rales. Heart: Regular rate and rhythm, no murmur, rub or gallop. Abdomen: Soft, non-tender,not distended. Bowel sounds normal. No masses Extremities: rt groin wound- purulent discharge Surrounding erythema  Skin: bruising over thighs Lymph: Cervical, supraclavicular normal. Neurologic: Grossly non-focal Pertinent Labs Lab Results CBC    Component Value Date/Time   WBC 14.5 (H) 04/23/2023 0631   RBC 3.43 (L) 04/23/2023 0631   HGB 9.4 (L) 04/23/2023 0631   HGB 13.6 06/01/2022 0000   HCT 29.1 (L) 04/23/2023 0631   HCT 40.5  06/01/2022 0000   PLT 320 04/23/2023 0631   PLT 132 (L) 06/01/2022 0000   MCV 84.8 04/23/2023 0631   MCV 90 06/01/2022 0000   MCH 27.4 04/23/2023 0631   MCHC 32.3 04/23/2023 0631   RDW 17.3 (H) 04/23/2023 0631   RDW 12.8 06/01/2022 0000   LYMPHSABS 0.7 04/22/2023 1528   LYMPHSABS 0.7 08/30/2021 0917   MONOABS 1.2 (H) 04/22/2023 1528   EOSABS 0.2 04/22/2023 1528   EOSABS 0.0 08/30/2021 0917   BASOSABS 0.0 04/22/2023 1528   BASOSABS 0.0 08/30/2021 0917       Latest Ref Rng & Units 04/23/2023    6:31 AM 04/22/2023    3:28 PM 04/20/2023    4:11 AM  CMP  Glucose 70 - 99 mg/dL 97  161  096   BUN 8 - 23 mg/dL 23  30  22    Creatinine 0.44 - 1.00 mg/dL 0.45  4.09  8.11   Sodium 135 - 145 mmol/L 141  137  137   Potassium 3.5 - 5.1 mmol/L 3.5  3.3  3.1   Chloride 98 - 111 mmol/L 109  104  103   CO2 22 - 32 mmol/L 22  27  23    Calcium 8.9 - 10.3 mg/dL 7.9  8.0  8.1   Total Protein 6.5 - 8.1 g/dL  5.9    Total Bilirubin <1.2 mg/dL  2.8    Alkaline Phos 38 - 126 U/L  88    AST 15 - 41 U/L  54    ALT 0 - 44 U/L  83        Microbiology: Recent Results (from the past 240 hour(s))  MRSA Next Gen by PCR, Nasal     Status: None   Collection Time: 04/13/23 11:00 PM   Specimen: Nasal Mucosa; Nasal Swab  Result Value Ref Range Status   MRSA by PCR Next Gen NOT DETECTED NOT DETECTED Final    Comment: (NOTE) The GeneXpert MRSA Assay (FDA approved for NASAL specimens only), is one component of a comprehensive MRSA colonization surveillance program. It is not intended to diagnose MRSA infection nor to guide or monitor treatment for MRSA infections. Test performance is not FDA approved in patients less than 2 years  old. Performed at [facility], [address] [place], [place] [address]   Blood Culture (routine x 2)     Status: None (Preliminary result)   Collection Time: 04/22/23  3:28 PM   Specimen: BLOOD  Result Value Ref Range Status   Specimen Description BLOOD RIGHT  ANTECUBITAL  Final   Special Requests   Final    BOTTLES DRAWN AEROBIC AND ANAEROBIC Blood Culture results may not be optimal due to an excessive volume of blood received in culture bottles   Culture   Final    NO GROWTH < 24 HOURS Performed at [facility], [address], [place], [place] [address]    Report Status PENDING  Incomplete  C Difficile Quick Screen w PCR reflex     Status: None   Collection Time: 04/22/23  4:19 PM   Specimen: STOOL  Result Value Ref Range Status   C Diff antigen NEGATIVE NEGATIVE Final   C Diff toxin NEGATIVE NEGATIVE Final   C Diff interpretation No C. difficile detected.  Final    Comment: Performed at [facility], [address] [place], [place] [address]  Gastrointestinal Panel by PCR , Stool     Status: None   Collection Time: 04/22/23  4:25 PM   Specimen: Stool  Result Value Ref Range Status   Campylobacter species NOT DETECTED NOT DETECTED Final   Plesimonas shigelloides NOT DETECTED NOT DETECTED Final   Salmonella species NOT DETECTED NOT DETECTED Final   Yersinia enterocolitica NOT DETECTED NOT DETECTED Final   Vibrio species NOT DETECTED NOT DETECTED Final   Vibrio cholerae NOT DETECTED NOT DETECTED Final   Enteroaggregative E coli (EAEC) NOT DETECTED NOT DETECTED Final   Enteropathogenic E coli (EPEC) NOT DETECTED NOT DETECTED Final   Enterotoxigenic E coli (ETEC) NOT DETECTED NOT DETECTED Final   Shiga like toxin producing E coli (STEC) NOT DETECTED NOT DETECTED Final   Shigella/Enteroinvasive E coli (EIEC) NOT DETECTED NOT DETECTED Final   Cryptosporidium NOT DETECTED NOT DETECTED Final   Cyclospora cayetanensis NOT DETECTED NOT DETECTED Final   Entamoeba histolytica NOT DETECTED NOT DETECTED Final   Giardia lamblia NOT DETECTED NOT DETECTED Final   Adenovirus F40/41 NOT DETECTED NOT DETECTED Final   Astrovirus NOT DETECTED NOT DETECTED Final   Norovirus GI/GII NOT DETECTED NOT DETECTED Final   Rotavirus A NOT  DETECTED NOT DETECTED Final   Sapovirus (I, II, IV, and V) NOT DETECTED NOT DETECTED Final    Comment: Performed at [facility], [address] [place], [place] [address]    IMAGING RESULTS: Rt retroperitoneal hematoma on CT abdomen/pelvis I have personally reviewed the films ? Impression/Recommendation Infected groin wound at the site of surgery with surrounding cellulitis Currently on vanco/ceftriaxone and flagyl Change vanco to linezolid for better tissue penetration and also for antitoxin effect Change ceftriaxone to cefepime for broader gram neg coverage R/o endovascular infection Seen by vascular- planning debridement and washout  Hematoma both retroperitoneal and superficial at the site of cardiac cath  Recent hemorrhagic shock needing PRBc  Injury to external iliac artery on the right which was repaired  CAD s/p stent ( restenosis of ostial LAD stent)? Afib  Pt on plavix, eliquis and aspirin  ? Discussed with patient, requesting provider

## 2023-04-23 NOTE — Progress Notes (Signed)
PHARMACY - ANTICOAGULATION CONSULT NOTE  Pharmacy Consult for Heparin Infusion Indication: atrial fibrillation  No Known Allergies  Patient Measurements: Height: 5' 2.99" (160 cm) Weight: 54.4 kg (119 lb 14.9 oz) IBW/kg (Calculated) : 52.38 Heparin Dosing Weight: 54.4 kg  Vital Signs: Temp: 98.2 F (36.8 C) (12/02 1544) Temp Source: Oral (12/02 1544) BP: 143/60 (12/02 1430) Pulse Rate: 88 (12/02 1430)  Labs: Recent Labs    04/22/23 1528 04/22/23 1540 04/22/23 2259 04/23/23 0631 04/23/23 1430  HGB 10.3*  --   --  9.4*  --   HCT 32.4*  --   --  29.1*  --   PLT 365  --   --  320  --   APTT  --  43*  --  85* 44*  LABPROT  --  18.2*  --   --   --   INR  --  1.5*  --   --   --   HEPARINUNFRC  --   --  >1.10* >1.10*  --   CREATININE 1.17*  --   --  1.02*  --    Estimated Creatinine Clearance: 37 mL/min (A) (by C-G formula based on SCr of 1.02 mg/dL (H)).  Medical History: Past Medical History:  Diagnosis Date   Actinic keratosis    Chronic pain    GERD (gastroesophageal reflux disease) ?   Taking Pepcid   History of basal cell carcinoma (BCC) 05/02/2021   right upper back paraspinal   History of SCC (squamous cell carcinoma) of skin 09/10/2019   left dorsum wrist ED&C done 10/28/19   History of SCC (squamous cell carcinoma) of skin 03/04/2020   right dorsum hand   History of squamous cell carcinoma in situ (SCCIS) 03/04/2020   left dorsum wrist  ED&C   History of squamous cell carcinoma in situ (SCCIS) 03/04/2020   right medial infraorbital  ED&C 04/28/2020   History of squamous cell carcinoma in situ (SCCIS) 10/28/2019   right dorsum hand proximal lateral   History of squamous cell carcinoma in situ (SCCIS) 10/28/2019   right dorsum hand proximal medial   Hyperlipidemia    Hypertension    NSTEMI (non-ST elevated myocardial infarction) (HCC) 04/12/2023   Renal disorder    Squamous cell carcinoma in situ 10/31/2021   left distal  tricep, EDC   Squamous cell  carcinoma of skin 08/28/2022   Right volar forearm - EDC   Assessment: Patient is a 77 year old female with a past medical history of hypertension, recent NSTEMI with left heart cath on 04/13/2023 complicated by iliac artery laceration with retroperitoneal hematoma requiring exploration and repair by vascular surgery who was discharged home 2 days ago. Now presents to hospital with redness, swelling, and increased pain at her right groin incision. Team wants to transition patient from her apixaban to IV heparin for possible surgical debridement. Last dose of apixaban unknown but at minimum prior to 1500 when she was admitted. Baseline INR 1.5 and aPTT 43. Baseline heparin level ordered. Hgb 10.3. PLT 365. No signs/symptoms of bleeding noted.  Goal of Therapy:  Heparin level 0.3-0.7 units/ml aPTT 66-102 seconds Monitor platelets by anticoagulation protocol: Yes  Labs:  1202 0631 aPTT 85 / HL > 1.10 - aPTT level therapeutic x 1  1202 1430 aPTT 44 / HL > 1.10 - aPTT level subtherapeutic    Plan:  aPTT level subtherapeutic  Give 1600 unit bolus x 1  Increase heparin infusion rate to 900 units/hr  Recheck aPTT level in  8 hours  Monitor CBC and heparin level daily Continue to monitor using aPTT levels until heparin level correlates  Littie Deeds, PharmD Pharmacy Resident  04/23/2023 3:47 PM

## 2023-04-24 ENCOUNTER — Inpatient Hospital Stay: Payer: Medicare Other

## 2023-04-24 DIAGNOSIS — L089 Local infection of the skin and subcutaneous tissue, unspecified: Secondary | ICD-10-CM | POA: Diagnosis not present

## 2023-04-24 DIAGNOSIS — I509 Heart failure, unspecified: Secondary | ICD-10-CM

## 2023-04-24 DIAGNOSIS — L03115 Cellulitis of right lower limb: Secondary | ICD-10-CM

## 2023-04-24 DIAGNOSIS — I482 Chronic atrial fibrillation, unspecified: Secondary | ICD-10-CM | POA: Diagnosis not present

## 2023-04-24 DIAGNOSIS — I2510 Atherosclerotic heart disease of native coronary artery without angina pectoris: Secondary | ICD-10-CM

## 2023-04-24 DIAGNOSIS — L03314 Cellulitis of groin: Secondary | ICD-10-CM | POA: Diagnosis not present

## 2023-04-24 LAB — CBC
HCT: 28.5 % — ABNORMAL LOW (ref 36.0–46.0)
Hemoglobin: 9.4 g/dL — ABNORMAL LOW (ref 12.0–15.0)
MCH: 28 pg (ref 26.0–34.0)
MCHC: 33 g/dL (ref 30.0–36.0)
MCV: 84.8 fL (ref 80.0–100.0)
Platelets: 351 10*3/uL (ref 150–400)
RBC: 3.36 MIL/uL — ABNORMAL LOW (ref 3.87–5.11)
RDW: 17 % — ABNORMAL HIGH (ref 11.5–15.5)
WBC: 14.5 10*3/uL — ABNORMAL HIGH (ref 4.0–10.5)
nRBC: 0 % (ref 0.0–0.2)

## 2023-04-24 LAB — BASIC METABOLIC PANEL
Anion gap: 8 (ref 5–15)
BUN: 25 mg/dL — ABNORMAL HIGH (ref 8–23)
CO2: 26 mmol/L (ref 22–32)
Calcium: 7.6 mg/dL — ABNORMAL LOW (ref 8.9–10.3)
Chloride: 105 mmol/L (ref 98–111)
Creatinine, Ser: 1.14 mg/dL — ABNORMAL HIGH (ref 0.44–1.00)
GFR, Estimated: 49 mL/min — ABNORMAL LOW (ref >60)
Glucose, Bld: 135 mg/dL — ABNORMAL HIGH (ref 70–99)
Potassium: 3.3 mmol/L — ABNORMAL LOW (ref 3.5–5.1)
Sodium: 139 mmol/L (ref 135–145)

## 2023-04-24 LAB — APTT
aPTT: 122 s — ABNORMAL HIGH (ref 24–36)
aPTT: 73 s — ABNORMAL HIGH (ref 24–36)
aPTT: 73 s — ABNORMAL HIGH (ref 24–36)

## 2023-04-24 LAB — C-REACTIVE PROTEIN: CRP: 14.6 mg/dL — ABNORMAL HIGH (ref <1.0)

## 2023-04-24 LAB — HEPARIN LEVEL (UNFRACTIONATED): Heparin Unfractionated: 0.8 [IU]/mL — ABNORMAL HIGH (ref 0.30–0.70)

## 2023-04-24 MED ORDER — CHLORHEXIDINE GLUCONATE 4 % EX SOLN
60.0000 mL | Freq: Once | CUTANEOUS | Status: AC
  Administered 2023-04-25: 4 via TOPICAL

## 2023-04-24 MED ORDER — METHYLPREDNISOLONE SODIUM SUCC 125 MG IJ SOLR: 125.0000 mg | Freq: Once | INTRAMUSCULAR | Status: DC | PRN

## 2023-04-24 MED ORDER — CEFAZOLIN SODIUM-DEXTROSE 2-4 GM/100ML-% IV SOLN
2.0000 g | INTRAVENOUS | Status: AC
  Administered 2023-04-25: 2 g via INTRAVENOUS
  Filled 2023-04-24: qty 100

## 2023-04-24 MED ORDER — CEFAZOLIN SODIUM-DEXTROSE 2-4 GM/100ML-% IV SOLN
2.0000 g | INTRAVENOUS | Status: DC
  Filled 2023-04-24: qty 100

## 2023-04-24 MED ORDER — CHLORHEXIDINE GLUCONATE 4 % EX SOLN
60.0000 mL | Freq: Once | CUTANEOUS | Status: AC
  Administered 2023-04-24: 4 via TOPICAL

## 2023-04-24 MED ORDER — DIPHENHYDRAMINE HCL 50 MG/ML IJ SOLN: 50.0000 mg | Freq: Once | INTRAMUSCULAR | Status: DC | PRN

## 2023-04-24 MED ORDER — HEPARIN (PORCINE) 25000 UT/250ML-% IV SOLN
800.0000 [IU]/h | INTRAVENOUS | Status: DC
  Administered 2023-04-24: 800 [IU]/h via INTRAVENOUS
  Filled 2023-04-24: qty 250

## 2023-04-24 MED ORDER — FAMOTIDINE 20 MG PO TABS: 40.0000 mg | ORAL_TABLET | Freq: Once | ORAL | Status: DC | PRN

## 2023-04-24 MED ORDER — SODIUM CHLORIDE 0.9 % IV SOLN: INTRAVENOUS | Status: DC

## 2023-04-24 MED ORDER — LIDOCAINE HCL (PF) 1 % IJ SOLN
10.0000 mL | Freq: Once | INTRAMUSCULAR | Status: AC
  Administered 2023-04-24: 10 mL via SUBCUTANEOUS
  Filled 2023-04-24: qty 10

## 2023-04-24 NOTE — Progress Notes (Signed)
PHARMACY - ANTICOAGULATION CONSULT NOTE  Pharmacy Consult for Heparin Infusion Indication: atrial fibrillation  No Known Allergies  Patient Measurements: Height: 5' 2.99" (160 cm) Weight: 54.4 kg (119 lb 14.9 oz) IBW/kg (Calculated) : 52.38 Heparin Dosing Weight: 54.4 kg  Vital Signs: Temp: 98.6 F (37 C) (12/02 2016) Temp Source: Oral (12/02 2016) BP: 146/79 (12/02 2016) Pulse Rate: 97 (12/02 2016)  Labs: Recent Labs    04/22/23 1528 04/22/23 1528 04/22/23 1540 04/22/23 2259 04/23/23 0631 04/23/23 1430 04/24/23 0202  HGB 10.3*  --   --   --  9.4*  --  9.4*  HCT 32.4*  --   --   --  29.1*  --  28.5*  PLT 365  --   --   --  320  --  351  APTT  --    < > 43*  --  85* 44* 122*  LABPROT  --   --  18.2*  --   --   --   --   INR  --   --  1.5*  --   --   --   --   HEPARINUNFRC  --   --   --  >1.10* >1.10*  --  0.80*  CREATININE 1.17*  --   --   --  1.02*  --  1.14*   < > = values in this interval not displayed.   Estimated Creatinine Clearance: 33.1 mL/min (A) (by C-G formula based on SCr of 1.14 mg/dL (H)).  Medical History: Past Medical History:  Diagnosis Date   Actinic keratosis    Chronic pain    GERD (gastroesophageal reflux disease) ?   Taking Pepcid   History of basal cell carcinoma (BCC) 05/02/2021   right upper back paraspinal   History of SCC (squamous cell carcinoma) of skin 09/10/2019   left dorsum wrist ED&C done 10/28/19   History of SCC (squamous cell carcinoma) of skin 03/04/2020   right dorsum hand   History of squamous cell carcinoma in situ (SCCIS) 03/04/2020   left dorsum wrist  ED&C   History of squamous cell carcinoma in situ (SCCIS) 03/04/2020   right medial infraorbital  ED&C 04/28/2020   History of squamous cell carcinoma in situ (SCCIS) 10/28/2019   right dorsum hand proximal lateral   History of squamous cell carcinoma in situ (SCCIS) 10/28/2019   right dorsum hand proximal medial   Hyperlipidemia    Hypertension    NSTEMI (non-ST  elevated myocardial infarction) (HCC) 04/12/2023   Renal disorder    Squamous cell carcinoma in situ 10/31/2021   left distal  tricep, EDC   Squamous cell carcinoma of skin 08/28/2022   Right volar forearm - EDC   Assessment: Patient is a 77 year old female with a past medical history of hypertension, recent NSTEMI with left heart cath on 04/13/2023 complicated by iliac artery laceration with retroperitoneal hematoma requiring exploration and repair by vascular surgery who was discharged home 2 days ago. Now presents to hospital with redness, swelling, and increased pain at her right groin incision. Team wants to transition patient from her apixaban to IV heparin for possible surgical debridement. Last dose of apixaban unknown but at minimum prior to 1500 when she was admitted. Baseline INR 1.5 and aPTT 43. Baseline heparin level ordered. Hgb 10.3. PLT 365. No signs/symptoms of bleeding noted.  Goal of Therapy:  Heparin level 0.3-0.7 units/ml aPTT 66-102 seconds Monitor platelets by anticoagulation protocol: Yes  Labs:  1202 0631 aPTT  85 / HL > 1.10 - aPTT level therapeutic x 1  1202 1430 aPTT 44 / HL > 1.10 - aPTT level subtherapeutic 1203 0202   aPTT 122 / HL > 0.80 ,  SUPRAtherapeutic    Plan:  12/03 @ 0202:  aPTT = 122,  HL = > 0.80 - aPTT elevated,  HL still elevated from PTA Eliquis - spoke with RN who drew sample, she confirmed it was drawn from opposite arm as heparin infusion - Will decrease heparin infusion to 800 units/hr and recheck aPTT 8 hrs from rate change - Will recheck HL on 12/4 with AM labs Monitor CBC daily Continue to monitor using aPTT levels until heparin level correlates  Markesia Crilly D, PharmD 04/24/2023 3:03 AM

## 2023-04-24 NOTE — Progress Notes (Signed)
Progress Note    04/24/2023 10:44 AM * No surgery date entered *  Subjective:  Dawn Bruce is a 77 yo female who presents to [facility] emergency room with post operative right groin infection. Patient was seen by Dr Myra Gianotti over the past weekend. Today at the bedside are the patients daughters. We had a long discussion about the reasoning behind the infection and the treatment. Patient endorses pain to her groin. She also endorses odor. Right groin was packed with guaze this morning and covered with an ABD pad. Daughters question patient having her left lung drained at the same time we are to fix her groin. Currently the plan is for a thoracentesis later today so we can proceed with surgery tomorrow. No other complaints. Vitals all remain stable.    Vitals:   04/24/23 0440 04/24/23 0835  BP: 131/72 (!) 146/77  Pulse: 90 86  Resp: 16 16  Temp: 98.6 F (37 C) 98.4 F (36.9 C)  SpO2: 98% 97%   Physical Exam: Cardiac:  Hx of Atrial Fibrillation. Regular Rate  Lungs:  Left Lung clear on auscultation. Right lung diminished breath sounds 1/2 way. Mild Dyspnea with couch. No wheezing noted.  Incisions:  Right groin with noted serous/pus drainage. Staples remain with erythema along the incision line. Packed with guaze and covered with an ABD pad.  Extremities:  Bilateral lower extremities warm to touch. Palpable pulses. Noted +1 edema to right lower extremity.  Abdomen:  Positive bowel sounds throughout, soft and non tender and non distended.  Neurologic: AAOX4, Follows commands.   CBC    Component Value Date/Time   WBC 14.5 (H) 04/24/2023 0202   RBC 3.36 (L) 04/24/2023 0202   HGB 9.4 (L) 04/24/2023 0202   HGB 13.6 06/01/2022 0000   HCT 28.5 (L) 04/24/2023 0202   HCT 40.5 06/01/2022 0000   PLT 351 04/24/2023 0202   PLT 132 (L) 06/01/2022 0000   MCV 84.8 04/24/2023 0202   MCV 90 06/01/2022 0000   MCH 28.0 04/24/2023 0202   MCHC 33.0 04/24/2023 0202   RDW 17.0 (H) 04/24/2023 0202    RDW 12.8 06/01/2022 0000   LYMPHSABS 0.7 04/22/2023 1528   LYMPHSABS 0.7 08/30/2021 0917   MONOABS 1.2 (H) 04/22/2023 1528   EOSABS 0.2 04/22/2023 1528   EOSABS 0.0 08/30/2021 0917   BASOSABS 0.0 04/22/2023 1528   BASOSABS 0.0 08/30/2021 0917    BMET    Component Value Date/Time   NA 139 04/24/2023 0202   NA 147 (H) 06/01/2022 0000   K 3.3 (L) 04/24/2023 0202   CL 105 04/24/2023 0202   CO2 26 04/24/2023 0202   GLUCOSE 135 (H) 04/24/2023 0202   BUN 25 (H) 04/24/2023 0202   BUN 23 06/01/2022 0000   CREATININE 1.14 (H) 04/24/2023 0202   CALCIUM 7.6 (L) 04/24/2023 0202   GFRNONAA 49 (L) 04/24/2023 0202   GFRAA 54 (L) 10/08/2019 1419    INR    Component Value Date/Time   INR 1.5 (H) 04/22/2023 1540     Intake/Output Summary (Last 24 hours) at 04/24/2023 1044 Last data filed at 04/24/2023 0416 Gross per 24 hour  Intake 637.46 ml  Output 800 ml  Net -162.54 ml     Assessment/Plan:  77 y.o. female is s/p Right groin endarterectomy with repair post cardiac catheterization hemorrhage. Now with infection   * No surgery date entered *   PLAN Dressing changed today by nursing and q 8 hrs.  Wound cultures remain pending this  morning. Will follow.  Patient to undergo thoracentesis later today in preparation for surgery tomorrow.  Patient and daughter is made aware of thoracentesis currently completed today surgery may need to be rescheduled for Friday.  Patient and daughters wish to proceed with surgery tomorrow as previously planned for wound washout with wound VAC placement.  I discussed the plan in detail with Dr. Vilinda Flake MD and he agrees with plan.  DVT prophylaxis: Heparin infusion.   [name] [facility] 04/24/2023 10:44 AM

## 2023-04-24 NOTE — Progress Notes (Signed)
PROGRESS NOTE  Dawn Bruce    DOB: February 07, 1946, 77 y.o.  WUJ:[id number]    Code Status: Full Code   DOA: 04/22/2023   LOS: 2   Brief hospital course  Dawn Bruce is a 77 y.o. female with medical history significant of dCHF, CAD (s/p of recent DES), A fib on Eliquis, HTN, HLD, CDK-3a, chronic pain syndrome, multiple skin cancer, perforated appendicitis, former smoker, who presents with right groin pain.   Patient was recently hospitalized from 11/21 - 11/29 due to non-STEMI.  Patient is s/p of LHC with DES placement. Pt developed retroperitoneal hematoma & hemorrhagic shock due to complication of Iliac artery laceration during left heart cath of right groin. Pt underwent surgical repair of external iliac artery by [facility] on 11/23.   Presented back to ED due to increased redness, swelling, and pain at incision site of the cath. Failed outpatient Abx x1 dose.  Hypotensive to 80/50 on presentation.    ED Course: pt was found to have WBC 21.3, BNP 626, stable renal function, potassium 3.3, Mg 2.3, phosphorus 2.7, lactic acid of 1.7, C diff negative. Temperature normal, blood pressure 141/74, heart rate 104, RR 29, oxygen saturation 98% on room air.   Dr. Myra Gianotti of [facility] is consulted by EDP.   CXR: Interval development of moderate size left pleural effusion with probable underlying atelectasis or infiltrate.   CT-abd/pelvis: Moderate sized right retroperitoneal hematoma is noted which extends into right pelvic region and displaces urinary bladder to the left. The pelvic component appears to be mildly enlarged compared to prior exam.   Bilateral pleural effusions are noted, left greater than right, with associated atelectasis of the lower lobes. Likely from her HF.    EKG:  QTc 532, T wave inversion in inferior leads and V2-V6  They were initially treated with potassium, CTX, heparin gtt, IV fluids, vancomycin, ibuprofen.   Patient was admitted to medicine service for further workup and  management of cellulitis as outlined in detail below.  12/2: pulmonology, and IR consulted to address pleural effusion. ID consulted to address abx course. Vascular evaluated and recommended debridement  04/24/23 -vitals stable. Wound appears much improved and pain has improved. Debridement planned for tomorrow. Thoracentesis likely today.   Assessment & Plan  Principal Problem:   Cellulitis of right groin Active Problems:   Sepsis (HCC)   CAD (coronary artery disease)   Peritoneal hematoma   Essential hypertension   Chronic diastolic heart failure (HCC)   Hypokalemia   Chronic kidney disease, stage 3a (HCC)   Pleural effusion   Abnormal LFTs   Atrial fibrillation, chronic (HCC)   Diarrhea   Chronic pain syndrome   HLD (hyperlipidemia)   HFrEF (heart failure with reduced ejection fraction) (HCC)   Severe sepsis (HCC)   Hematoma   Cellulitis of right thigh  Sepsis due to cellulitis of right groin: sepsis criteria met on admission with WBC 21.3, heart rate 104, RR 29.  Lactic acid normal 1.7.  infection source is superficial groin. Dr. Myra Gianotti of [facility] removed several staples, probed and packed wound of right groin and she was started on IV Abx.  Today, WBC stable ~14.5, she remains afebrile. Wound showing improvement in drainage and redness today. Foul smell remains.  BxCx NGTD. Wound cultures pending.  - ID following  - linezolid + cefepime - follow wound culture, blood culture - follow VSS. Likely debridement tomorrow - analgesia PRN  Hypervolemia  Bilateral Pleural effusions- incidental findings- L worse than R. Denies respiratory symptoms.  Had recent echo showing HFrEF. Stable ORA. Of note, had thoracentesis 9/22. - pulmonology consulted, IR for thoracentesis.  - watch respiratory status closely - trial of IV lasix helped her edema.    Hx of CAD: s/p of recent DES. - hold the Plavix, and switch Eliquis to IV heparin. - plan to restart plavix s/p cath -continue ASA,  heparin gtt -close monitoring of chest pain development.    Peritoneal hematoma: Hgb mild drop 10.3>9.4>9.4.Blood loss from wound is minimal. - closely monitor hematoma on heparin gtt -Follow-up CBC am   Essential hypertension: Blood pressure stable -Continue Coreg which is also for A-fib - holding other home meds for transient hypotension   HFrEF- EF 25-30% on echo 11/24. Considerable edema of all extremities -Watch volume status closely s/p trial of IV diuretics   Hypokalemia: Potassium 3.3.  Magnesium 2.3, phosphorus 2.7 -Repleted potassium   Chronic kidney disease, stage 3a (HCC): Stable. Cr 1.02>1.14 today -Follow-up with BMP am    Abnormal LFTs: her liver function has been improving.  She has ALP 80, AST 54, ALT 83, total bilirubin 2.8, which is better than recent liver function.   Atrial fibrillation, chronic (HCC): Heart rate 104 -Switch Eliquis to IV heparin -Continue Coreg   Diarrhea: C. difficile negative. -As needed Imodium   Chronic pain syndrome -Continue home fentanyl patch and Lyrica   HLD: -lipitor and zetia  Body mass index is 25.8 kg/m.  VTE ppx: heparin gtt  Diet:     Diet   Diet Heart Room service appropriate? Yes; Fluid consistency: Thin   Consultants: Vascular surgery ID  Subjective 04/24/23    Pt reports feeling overall well. Denies pain at rest. Denies respiratory symptoms.    Objective   Vitals:   04/23/23 1729 04/23/23 2016 04/24/23 0440 04/24/23 0500  BP: (!) 143/80 (!) 146/79 131/72   Pulse: 82 97 90   Resp: 20 18 16    Temp: 97.9 F (36.6 C) 98.6 F (37 C) 98.6 F (37 C)   TempSrc: Oral Oral Oral   SpO2: 99% 97% 98%   Weight:    66 kg  Height:        Intake/Output Summary (Last 24 hours) at 04/24/2023 0825 Last data filed at 04/24/2023 0416 Gross per 24 hour  Intake 637.46 ml  Output 800 ml  Net -162.54 ml   Filed Weights   04/22/23 2200 04/24/23 0500  Weight: 54.4 kg 66 kg     Physical Exam:  General:  awake, alert, NAD HEENT: atraumatic, clear conjunctiva, anicteric sclera, MMM, hearing grossly normal Respiratory: normal respiratory effort. Cardiovascular: quick capillary refill, normal S1/S2, RRR, no JVD, murmurs Gastrointestinal: soft, NT, ND Nervous: A&O x3. no gross focal neurologic deficits, normal speech Extremities: 1+ pitting edema of all extremities Skin: R groin- staples in place, granulation tissue vs purulence drainage. Significant serosanguinous drainage. Foul odor. Erythema, increased temp have greatly improved and are surrounding the wound Psychiatry: normal mood, congruent affect  Labs   I have personally reviewed the following labs and imaging studies CBC    Component Value Date/Time   WBC 14.5 (H) 04/24/2023 0202   RBC 3.36 (L) 04/24/2023 0202   HGB 9.4 (L) 04/24/2023 0202   HGB 13.6 06/01/2022 0000   HCT 28.5 (L) 04/24/2023 0202   HCT 40.5 06/01/2022 0000   PLT 351 04/24/2023 0202   PLT 132 (L) 06/01/2022 0000   MCV 84.8 04/24/2023 0202   MCV 90 06/01/2022 0000   MCH 28.0 04/24/2023 0202  MCHC 33.0 04/24/2023 0202   RDW 17.0 (H) 04/24/2023 0202   RDW 12.8 06/01/2022 0000   LYMPHSABS 0.7 04/22/2023 1528   LYMPHSABS 0.7 08/30/2021 0917   MONOABS 1.2 (H) 04/22/2023 1528   EOSABS 0.2 04/22/2023 1528   EOSABS 0.0 08/30/2021 0917   BASOSABS 0.0 04/22/2023 1528   BASOSABS 0.0 08/30/2021 0917      Latest Ref Rng & Units 04/24/2023    2:02 AM 04/23/2023    6:31 AM 04/22/2023    3:28 PM  BMP  Glucose 70 - 99 mg/dL 865  97  784   BUN 8 - 23 mg/dL 25  23  30    Creatinine 0.44 - 1.00 mg/dL 6.96  2.95  2.84   Sodium 135 - 145 mmol/L 139  141  137   Potassium 3.5 - 5.1 mmol/L 3.3  3.5  3.3   Chloride 98 - 111 mmol/L 105  109  104   CO2 22 - 32 mmol/L 26  22  27    Calcium 8.9 - 10.3 mg/dL 7.6  7.9  8.0     [place] EKG SITE RITE  Result Date: 04/23/2023 If Site Rite image not attached, placement could not be confirmed due to current cardiac rhythm.  CT ABDOMEN  PELVIS W CONTRAST  Result Date: 04/22/2023 CLINICAL DATA:  Right lower quadrant abdominal pain. EXAM: CT ABDOMEN AND PELVIS WITH CONTRAST TECHNIQUE: Multidetector CT imaging of the abdomen and pelvis was performed using the standard protocol following bolus administration of intravenous contrast. RADIATION DOSE REDUCTION: This exam was performed according to the departmental dose-optimization program which includes automated exposure control, adjustment of the mA and/or kV according to patient size and/or use of iterative reconstruction technique. CONTRAST:  75mL OMNIPAQUE IOHEXOL 300 MG/ML  SOLN COMPARISON:  April 13, 2023. FINDINGS: Lower chest: Large left pleural effusion is noted with small right pleural effusion, with adjacent atelectasis of the lower lobes. Hepatobiliary: No focal liver abnormality is seen. No gallstones, gallbladder wall thickening, or biliary dilatation. Pancreas: Unremarkable. No pancreatic ductal dilatation or surrounding inflammatory changes. Spleen: Normal in size without focal abnormality. Adrenals/Urinary Tract: Adrenal glands and kidneys appear normal. No hydronephrosis or renal obstruction is noted. Urinary bladder is displaced to the left by moderate size right-sided pelvic hematoma which appears to be slightly enlarged compared to prior exam. Stomach/Bowel: The stomach is unremarkable. There is no evidence of bowel obstruction or inflammation. Status post appendectomy. Vascular/Lymphatic: Aortic atherosclerosis. No enlarged abdominal or pelvic lymph nodes. Reproductive: Status post hysterectomy. No adnexal masses. Other: As noted above, moderate size right-sided retroperitoneal hematoma is noted which extends into right pelvic region, where it is mildly enlarged. Subcutaneous gas is noted in the right lower quadrant suggesting postsurgical change. Musculoskeletal: No acute or significant osseous findings. IMPRESSION: Moderate sized right retroperitoneal hematoma is noted which  extends into right pelvic region and displaces urinary bladder to the left. The pelvic component appears to be mildly enlarged compared to prior exam. Bilateral pleural effusions are noted, left greater than right, with associated atelectasis of the lower lobes. Electronically Signed   By: Lupita Raider M.D.   On: 04/22/2023 17:45   DG Chest Port 1 View  Result Date: 04/22/2023 CLINICAL DATA:  Possible wound infection.  Hypotension. EXAM: PORTABLE CHEST 1 VIEW COMPARISON:  April 16, 2023. FINDINGS: Interval development of moderate size left pleural effusion with probable underlying atelectasis or pneumonia. Right lung is clear. IMPRESSION: Interval development of moderate size left pleural effusion with probable underlying atelectasis  or infiltrate. Electronically Signed   By: Lupita Raider M.D.   On: 04/22/2023 16:49    Disposition Plan & Communication  Patient status: Inpatient  Admitted From: Home Planned disposition location: Home Anticipated discharge date: TBD pending clinical improvement  Family Communication: spouse at bedside    Author: Leeroy Bock, DO [facility] 04/24/2023, 8:25 AM   Available by Epic secure chat 7AM-7PM. If 7PM-7AM, please contact night-coverage.  [facility] contact information found on [email or web address].

## 2023-04-24 NOTE — Progress Notes (Signed)
Date of Admission:  04/22/2023     ID: Dawn Bruce is a 77 y.o. female  Principal Problem:   Cellulitis of right groin Active Problems:   Chronic pain syndrome   Essential hypertension   HLD (hyperlipidemia)   Chronic diastolic heart failure (HCC)   Sepsis (HCC)   CAD (coronary artery disease)   Chronic kidney disease, stage 3a (HCC)   Peritoneal hematoma   Pleural effusion   Abnormal LFTs   Hypokalemia   Atrial fibrillation, chronic (HCC)   Diarrhea   HFrEF (heart failure with reduced ejection fraction) (HCC)   Severe sepsis (HCC)   Hematoma   Cellulitis of right thigh    Subjective: Pt feeling a little better Less pain rt thigh  Medications:   alum & mag hydroxide-simeth  15 mL Oral Once   aspirin EC  81 mg Oral Daily   atorvastatin  80 mg Oral Daily   carvedilol  12.5 mg Oral BID   Chlorhexidine Gluconate Cloth  6 each Topical Daily   ezetimibe  10 mg Oral Daily   fentaNYL  1 patch Transdermal Q72H   pantoprazole  40 mg Oral Daily   pregabalin  150 mg Oral BID   sodium chloride flush  10-40 mL Intracatheter Q12H    Objective: Vital signs in last 24 hours: Patient Vitals for the past 24 hrs:  BP Temp Temp src Pulse Resp SpO2 Weight  04/24/23 0835 (!) 146/77 98.4 F (36.9 C) Oral 86 16 97 % --  04/24/23 0500 -- -- -- -- -- -- 66 kg  04/24/23 0440 131/72 98.6 F (37 C) Oral 90 16 98 % --  04/23/23 2016 (!) 146/79 98.6 F (37 C) Oral 97 18 97 % --  04/23/23 1729 (!) 143/80 97.9 F (36.6 C) Oral 82 20 99 % --  04/23/23 1600 92/76 -- -- 87 17 97 % --  04/23/23 1544 -- 98.2 F (36.8 C) Oral -- -- -- --  04/23/23 1530 (!) 123/52 -- -- 86 (!) 26 96 % --  04/23/23 1430 (!) 143/60 -- -- 88 (!) 21 100 % --      PHYSICAL EXAM:  General: Alert, cooperative, no distress, appears stated age.  Head: Normocephalic, without obvious abnormality, atraumatic. Eyes: Conjunctivae clear, anicteric sclerae. Pupils are equal ENT Nares normal. No drainage or sinus  tenderness. Lips, mucosa, and tongue normal. No Thrush Neck: Supple, symmetrical, no adenopathy, thyroid: non tender no carotid bruit and no JVD. Back: No CVA tenderness. Lungs: Clear to auscultation bilaterally. No Wheezing or Rhonchi. No rales. Heart: Regular rate and rhythm, no murmur, rub or gallop. Abdomen: Soft, non-tender,not distended. Bowel sounds normal. No masses Rt groin surgical wound dehiscence erythema around Extremities: bruising thighs Skin: as above Lymph: Cervical, supraclavicular normal. Neurologic: Grossly non-focal  Lab Results    Latest Ref Rng & Units 04/24/2023    2:02 AM 04/23/2023    6:31 AM 04/22/2023    3:28 PM  CBC  WBC 4.0 - 10.5 K/uL 14.5  14.5  21.3   Hemoglobin 12.0 - 15.0 g/dL 9.4  9.4  65.9   Hematocrit 36.0 - 46.0 % 28.5  29.1  32.4   Platelets 150 - 400 K/uL 351  320  365        Latest Ref Rng & Units 04/24/2023    2:02 AM 04/23/2023    6:31 AM 04/22/2023    3:28 PM  CMP  Glucose 70 - 99 mg/dL 935  97  701  BUN 8 - 23 mg/dL 25  23  30    Creatinine 0.44 - 1.00 mg/dL 1.61  0.96  0.45   Sodium 135 - 145 mmol/L 139  141  137   Potassium 3.5 - 5.1 mmol/L 3.3  3.5  3.3   Chloride 98 - 111 mmol/L 105  109  104   CO2 22 - 32 mmol/L 26  22  27    Calcium 8.9 - 10.3 mg/dL 7.6  7.9  8.0   Total Protein 6.5 - 8.1 g/dL   5.9   Total Bilirubin <1.2 mg/dL   2.8   Alkaline Phos 38 - 126 U/L   88   AST 15 - 41 U/L   54   ALT 0 - 44 U/L   83       Microbiology: Wound culture pending  Studies/Results: [place] EKG SITE RITE  Result Date: 04/23/2023 If Site Rite image not attached, placement could not be confirmed due to current cardiac rhythm.  CT ABDOMEN PELVIS W CONTRAST  Result Date: 04/22/2023 CLINICAL DATA:  Right lower quadrant abdominal pain. EXAM: CT ABDOMEN AND PELVIS WITH CONTRAST TECHNIQUE: Multidetector CT imaging of the abdomen and pelvis was performed using the standard protocol following bolus administration of intravenous contrast.  RADIATION DOSE REDUCTION: This exam was performed according to the departmental dose-optimization program which includes automated exposure control, adjustment of the mA and/or kV according to patient size and/or use of iterative reconstruction technique. CONTRAST:  75mL OMNIPAQUE IOHEXOL 300 MG/ML  SOLN COMPARISON:  April 13, 2023. FINDINGS: Lower chest: Large left pleural effusion is noted with small right pleural effusion, with adjacent atelectasis of the lower lobes. Hepatobiliary: No focal liver abnormality is seen. No gallstones, gallbladder wall thickening, or biliary dilatation. Pancreas: Unremarkable. No pancreatic ductal dilatation or surrounding inflammatory changes. Spleen: Normal in size without focal abnormality. Adrenals/Urinary Tract: Adrenal glands and kidneys appear normal. No hydronephrosis or renal obstruction is noted. Urinary bladder is displaced to the left by moderate size right-sided pelvic hematoma which appears to be slightly enlarged compared to prior exam. Stomach/Bowel: The stomach is unremarkable. There is no evidence of bowel obstruction or inflammation. Status post appendectomy. Vascular/Lymphatic: Aortic atherosclerosis. No enlarged abdominal or pelvic lymph nodes. Reproductive: Status post hysterectomy. No adnexal masses. Other: As noted above, moderate size right-sided retroperitoneal hematoma is noted which extends into right pelvic region, where it is mildly enlarged. Subcutaneous gas is noted in the right lower quadrant suggesting postsurgical change. Musculoskeletal: No acute or significant osseous findings. IMPRESSION: Moderate sized right retroperitoneal hematoma is noted which extends into right pelvic region and displaces urinary bladder to the left. The pelvic component appears to be mildly enlarged compared to prior exam. Bilateral pleural effusions are noted, left greater than right, with associated atelectasis of the lower lobes. Electronically Signed   By: Lupita Raider M.D.   On: 04/22/2023 17:45   DG Chest Port 1 View  Result Date: 04/22/2023 CLINICAL DATA:  Possible wound infection.  Hypotension. EXAM: PORTABLE CHEST 1 VIEW COMPARISON:  April 16, 2023. FINDINGS: Interval development of moderate size left pleural effusion with probable underlying atelectasis or pneumonia. Right lung is clear. IMPRESSION: Interval development of moderate size left pleural effusion with probable underlying atelectasis or infiltrate. Electronically Signed   By: Lupita Raider M.D.   On: 04/22/2023 16:49     Assessment/Plan: Infected groin wound at the site of surgery with surrounding cellulitis Currently on linezolid/cefepime flagyl Culture sent- pending R/o endovascular infection Seen  by vascular- planning debridement and washout tomorrow   Hematoma both retroperitoneal and superficial at the site of cardiac cath   Recent hemorrhagic shock needing PRBc   Injury to external iliac artery on the right which was repaired   CAD s/p stent ( restenosis of ostial LAD stent)? Afib   Discussed the management with the patient

## 2023-04-24 NOTE — H&P (View-Only) (Signed)
Progress Note    04/24/2023 10:44 AM * No surgery date entered *  Subjective:  Dawn Bruce is a 77 yo female who presents to [facility] emergency room with post operative right groin infection. Patient was seen by Dr Myra Gianotti over the past weekend. Today at the bedside are the patients daughters. We had a long discussion about the reasoning behind the infection and the treatment. Patient endorses pain to her groin. She also endorses odor. Right groin was packed with guaze this morning and covered with an ABD pad. Daughters question patient having her left lung drained at the same time we are to fix her groin. Currently the plan is for a thoracentesis later today so we can proceed with surgery tomorrow. No other complaints. Vitals all remain stable.    Vitals:   04/24/23 0440 04/24/23 0835  BP: 131/72 (!) 146/77  Pulse: 90 86  Resp: 16 16  Temp: 98.6 F (37 C) 98.4 F (36.9 C)  SpO2: 98% 97%   Physical Exam: Cardiac:  Hx of Atrial Fibrillation. Regular Rate  Lungs:  Left Lung clear on auscultation. Right lung diminished breath sounds 1/2 way. Mild Dyspnea with couch. No wheezing noted.  Incisions:  Right groin with noted serous/pus drainage. Staples remain with erythema along the incision line. Packed with guaze and covered with an ABD pad.  Extremities:  Bilateral lower extremities warm to touch. Palpable pulses. Noted +1 edema to right lower extremity.  Abdomen:  Positive bowel sounds throughout, soft and non tender and non distended.  Neurologic: AAOX4, Follows commands.   CBC    Component Value Date/Time   WBC 14.5 (H) 04/24/2023 0202   RBC 3.36 (L) 04/24/2023 0202   HGB 9.4 (L) 04/24/2023 0202   HGB 13.6 06/01/2022 0000   HCT 28.5 (L) 04/24/2023 0202   HCT 40.5 06/01/2022 0000   PLT 351 04/24/2023 0202   PLT 132 (L) 06/01/2022 0000   MCV 84.8 04/24/2023 0202   MCV 90 06/01/2022 0000   MCH 28.0 04/24/2023 0202   MCHC 33.0 04/24/2023 0202   RDW 17.0 (H) 04/24/2023 0202    RDW 12.8 06/01/2022 0000   LYMPHSABS 0.7 04/22/2023 1528   LYMPHSABS 0.7 08/30/2021 0917   MONOABS 1.2 (H) 04/22/2023 1528   EOSABS 0.2 04/22/2023 1528   EOSABS 0.0 08/30/2021 0917   BASOSABS 0.0 04/22/2023 1528   BASOSABS 0.0 08/30/2021 0917    BMET    Component Value Date/Time   NA 139 04/24/2023 0202   NA 147 (H) 06/01/2022 0000   K 3.3 (L) 04/24/2023 0202   CL 105 04/24/2023 0202   CO2 26 04/24/2023 0202   GLUCOSE 135 (H) 04/24/2023 0202   BUN 25 (H) 04/24/2023 0202   BUN 23 06/01/2022 0000   CREATININE 1.14 (H) 04/24/2023 0202   CALCIUM 7.6 (L) 04/24/2023 0202   GFRNONAA 49 (L) 04/24/2023 0202   GFRAA 54 (L) 10/08/2019 1419    INR    Component Value Date/Time   INR 1.5 (H) 04/22/2023 1540     Intake/Output Summary (Last 24 hours) at 04/24/2023 1044 Last data filed at 04/24/2023 0416 Gross per 24 hour  Intake 637.46 ml  Output 800 ml  Net -162.54 ml     Assessment/Plan:  77 y.o. female is s/p Right groin endarterectomy with repair post cardiac catheterization hemorrhage. Now with infection   * No surgery date entered *   PLAN Dressing changed today by nursing and q 8 hrs.  Wound cultures remain pending this  morning. Will follow.  Patient to undergo thoracentesis later today in preparation for surgery tomorrow.  Patient and daughter is made aware of thoracentesis currently completed today surgery may need to be rescheduled for Friday.  Patient and daughters wish to proceed with surgery tomorrow as previously planned for wound washout with wound VAC placement.  I discussed the plan in detail with Dr. Vilinda Flake MD and he agrees with plan.  DVT prophylaxis: Heparin infusion.   [name] 04/24/2023 10:44 AM

## 2023-04-24 NOTE — Consult Note (Signed)
PULMONOLOGY         Date: 04/24/2023,   MRN# [id number] Dawn Bruce 1945/07/10     AdmissionWeight: 54.4 kg                 CurrentWeight: 66 kg  Referring provider: Dr Gilda Crease   CHIEF COMPLAINT:   Complete atelectasis of left lung with bilateral pleural effusions.    HISTORY OF PRESENT ILLNESS   77 year old female with a history of chronic GERD, basal cell carcinoma squamous cell carcinoma history of NSTEMI diastolic CHF, CAD status post recent PCI with DES, chronic A-fib on Eliquis, essential hypertension, dyslipidemia CKD 3A chronic pain syndrome remote perforated appendicitis, hospitalized November 2024 with non-STEMI.  Post left heart cath had developed retroperitoneal hematoma and hemorrhagic shock with complications of iliac artery laceration.  Status post surgical repair via vascular surgery service.  Had outpatient therapy with antibiotics with failure came in with transient hypotension and circulatory shock.  Found to have elevated cardiac biomarkers and acute on chronic CHF.  Vascular surgery was reconsulted today and PCCM consultation was placed to incidental finding of complete atelectasis of the left lung with surrounding bilateral pleural effusions worse on the left.  04/24/23- patient stable , appears to be in good spirits.  Vitals are stable.  Has not had thoracentesis yet. Likely will have today.   PAST MEDICAL HISTORY   Past Medical History:  Diagnosis Date   Actinic keratosis    Chronic pain    GERD (gastroesophageal reflux disease) ?   Taking Pepcid   History of basal cell carcinoma (BCC) 05/02/2021   right upper back paraspinal   History of SCC (squamous cell carcinoma) of skin 09/10/2019   left dorsum wrist ED&C done 10/28/19   History of SCC (squamous cell carcinoma) of skin 03/04/2020   right dorsum hand   History of squamous cell carcinoma in situ (SCCIS) 03/04/2020   left dorsum wrist  ED&C   History of squamous cell carcinoma in situ  (SCCIS) 03/04/2020   right medial infraorbital  ED&C 04/28/2020   History of squamous cell carcinoma in situ (SCCIS) 10/28/2019   right dorsum hand proximal lateral   History of squamous cell carcinoma in situ (SCCIS) 10/28/2019   right dorsum hand proximal medial   Hyperlipidemia    Hypertension    NSTEMI (non-ST elevated myocardial infarction) (HCC) 04/12/2023   Renal disorder    Squamous cell carcinoma in situ 10/31/2021   left distal  tricep, EDC   Squamous cell carcinoma of skin 08/28/2022   Right volar forearm - EDC     SURGICAL HISTORY   Past Surgical History:  Procedure Laterality Date   ABDOMINAL HYSTERECTOMY     APPENDECTOMY  April 2023   Burst appendix   AUGMENTATION MAMMAPLASTY Bilateral    25-30 years ago   CARDIOVERSION N/A 12/01/2020   Procedure: CARDIOVERSION;  Surgeon: Debbe Odea, MD;  Location: [facility] ORS;  Service: Cardiovascular;  Laterality: N/A;   CAROTID ARTERY ANGIOPLASTY Right 1995(approximate)   CAROTID ENDARTERECTOMY  2001   CORONARY STENT INTERVENTION N/A 04/13/2023   Procedure: CORONARY STENT INTERVENTION;  Surgeon: Marcina Millard, MD;  Location: [facility] INVASIVE CV LAB;  Service: Cardiovascular;  Laterality: N/A;   COSMETIC SURGERY     ELBOW SURGERY Left    ENDARTERECTOMY FEMORAL Right 04/13/2023   Procedure: Right groin exploration with repair of right external iliac artery and right circumflex artery;  Surgeon: Bertram Denver, MD;  Location: [facility] ORS;  Service: Vascular;  Laterality: Right;   FRACTURE SURGERY  2016   Arm   LEFT HEART CATH AND CORONARY ANGIOGRAPHY N/A 04/13/2023   Procedure: LEFT HEART CATH AND CORONARY ANGIOGRAPHY;  Surgeon: Marcina Millard, MD;  Location: [facility] INVASIVE CV LAB;  Service: Cardiovascular;  Laterality: N/A;   THORACENTESIS N/A 12/27/2020   Procedure: Alanson Puls;  Surgeon: Josephine Igo, DO;  Location: [facility];  Service: Pulmonary;  Laterality: N/A;   TONSILLECTOMY     TUBAL LIGATION  ?      FAMILY HISTORY   Family History  Problem Relation Age of Onset   Cancer Mother    Cancer Father    Cancer Sister    Breast cancer Neg Hx      SOCIAL HISTORY   Social History   Tobacco Use   Smoking status: Former    Current packs/day: 0.00    Average packs/day: 0.3 packs/day for 15.0 years (3.8 ttl pk-yrs)    Types: Cigarettes    Start date: 53    Quit date: 55    Years since quitting: 34.9    Passive exposure: Past   Smokeless tobacco: Never   Tobacco comments:    quit around 1990-1995 (?)  Vaping Use   Vaping status: Never Used  Substance Use Topics   Alcohol use: Yes    Alcohol/week: 14.0 standard drinks of alcohol    Types: 14 Standard drinks or equivalent per week    Comment: 2 scotch shots   Drug use: Never     MEDICATIONS    Home Medication:     Current Medication:  Current Facility-Administered Medications:    acetaminophen (TYLENOL) tablet 650 mg, 650 mg, Oral, Q6H PRN, Leeroy Bock, MD   albuterol (PROVENTIL) (2.5 MG/3ML) 0.083% nebulizer solution 2.5 mg, 2.5 mg, Nebulization, Q4H PRN, Belue, Nathan S, RPH   alum & mag hydroxide-simeth (MAALOX/MYLANTA) 200-200-20 MG/5ML suspension 15 mL, 15 mL, Oral, Once, Jawo, Modou L, NP   aspirin EC tablet 81 mg, 81 mg, Oral, Daily, Jamelle Rushing L, MD, 81 mg at 04/24/23 0949   atorvastatin (LIPITOR) tablet 80 mg, 80 mg, Oral, Daily, Lorretta Harp, MD, 80 mg at 04/24/23 0949   carvedilol (COREG) tablet 12.5 mg, 12.5 mg, Oral, BID, Lorretta Harp, MD, 12.5 mg at 04/24/23 0949   ceFEPIme (MAXIPIME) 2 g in sodium chloride 0.9 % 100 mL IVPB, 2 g, Intravenous, Q12H, Tressie Ellis, RPH, Last Rate: 200 mL/hr at 04/24/23 0528, 2 g at 04/24/23 6578   Chlorhexidine Gluconate Cloth 2 % PADS 6 each, 6 each, Topical, Daily, Leeroy Bock, MD, 6 each at 04/23/23 1730   ezetimibe (ZETIA) tablet 10 mg, 10 mg, Oral, Daily, Lorretta Harp, MD, 10 mg at 04/24/23 0949   fentaNYL (DURAGESIC) 75 MCG/HR 1 patch, 1  patch, Transdermal, Q72H, Lorretta Harp, MD, 1 patch at 04/23/23 0951   heparin ADULT infusion 100 units/mL (25000 units/247mL), 800 Units/hr, Intravenous, Continuous, Leeroy Bock, MD, Last Rate: 8 mL/hr at 04/24/23 0531, 800 Units/hr at 04/24/23 0531   ibuprofen (ADVIL) tablet 200 mg, 200 mg, Oral, Q6H PRN, Lorretta Harp, MD, 200 mg at 04/22/23 2254   linezolid (ZYVOX) IVPB 600 mg, 600 mg, Intravenous, Q12H, Ravishankar, Rhodia Albright, MD, Last Rate: 300 mL/hr at 04/24/23 0955, 600 mg at 04/24/23 0955   loperamide (IMODIUM) capsule 2 mg, 2 mg, Oral, BID PRN, Lorretta Harp, MD   oxyCODONE (Oxy IR/ROXICODONE) immediate release tablet 5 mg, 5 mg, Oral, Q6H PRN, Lorretta Harp, MD, 5 mg at 04/24/23  0527   pantoprazole (PROTONIX) EC tablet 40 mg, 40 mg, Oral, Daily, Lorretta Harp, MD, 40 mg at 04/24/23 0949   phenylephrine ((USE for PREPARATION-H)) 0.25 % suppository 1 suppository, 1 suppository, Rectal, PRN, Lorretta Harp, MD   pregabalin (LYRICA) capsule 150 mg, 150 mg, Oral, BID, Lorretta Harp, MD, 150 mg at 04/24/23 0949   sodium chloride flush (NS) 0.9 % injection 10-40 mL, 10-40 mL, Intracatheter, Q12H, Jamelle Rushing L, MD, 10 mL at 04/23/23 2208   sodium chloride flush (NS) 0.9 % injection 10-40 mL, 10-40 mL, Intracatheter, PRN, Leeroy Bock, MD   traZODone (DESYREL) tablet 25-50 mg, 25-50 mg, Oral, QHS PRN, Lorretta Harp, MD, 50 mg at 04/23/23 2209    ALLERGIES   Patient has no known allergies.     REVIEW OF SYSTEMS    Review of Systems:  Gen:  Denies  fever, sweats, chills weigh loss  HEENT: Denies blurred vision, double vision, ear pain, eye pain, hearing loss, nose bleeds, sore throat Cardiac:  No dizziness, chest pain or heaviness, chest tightness,edema Resp:   reports dyspnea chronically  Gi: Denies swallowing difficulty, stomach pain, nausea or vomiting, diarrhea, constipation, bowel incontinence Gu:  Denies bladder incontinence, burning urine Ext:   Denies Joint pain, stiffness or  swelling Skin: Denies  skin rash, easy bruising or bleeding or hives Endoc:  Denies polyuria, polydipsia , polyphagia or weight change Psych:   Denies depression, insomnia or hallucinations   Other:  All other systems negative   VS: BP (!) 146/77 (BP Location: Right Leg)   Pulse 86   Temp 98.4 F (36.9 C) (Oral)   Resp 16   Ht 5' 2.99" (1.6 m)   Wt 66 kg   SpO2 97%   BMI 25.80 kg/m      PHYSICAL EXAM    GENERAL:NAD, no fevers, chills, no weakness no fatigue HEAD: Normocephalic, atraumatic.  EYES: Pupils equal, round, reactive to light. Extraocular muscles intact. No scleral icterus.  MOUTH: Moist mucosal membrane. Dentition intact. No abscess noted.  EAR, NOSE, THROAT: Clear without exudates. No external lesions.  NECK: Supple. No thyromegaly. No nodules. No JVD.  PULMONARY: decreased breath sounds with mild rhonchi worse at bases bilaterally.  CARDIOVASCULAR: S1 and S2. Regular rate and rhythm. No murmurs, rubs, or gallops. No edema. Pedal pulses 2+ bilaterally.  GASTROINTESTINAL: Soft, nontender, nondistended. No masses. Positive bowel sounds. No hepatosplenomegaly.  MUSCULOSKELETAL: No swelling, clubbing, or edema. Range of motion full in all extremities.  NEUROLOGIC: Cranial nerves II through XII are intact. No gross focal neurological deficits. Sensation intact. Reflexes intact.  SKIN: No ulceration, lesions, rashes, or cyanosis. Skin warm and dry. Turgor intact.  PSYCHIATRIC: Mood, affect within normal limits. The patient is awake, alert and oriented x 3. Insight, judgment intact.       IMAGING     ASSESSMENT/PLAN   Complete atelectasis of left lung   - patient s/p diuresis with partial improvement however still has minimal lung sounds on left.   - she does have CKD 3 and may not produce adequate fluid removal via medication alone   - we discussed options for thoracentesis and patient would like to proceed with this procedure.   - IR consultation placed for  therapeutic tap -incentive spirometry at bedside to be used multiple times daily  - PT/OT - etiology is likely poor nutrition post ICU hospitalization with CHF and CKD all contributing to third spaced interstitial edema , peripheral edema and effusion  Bilateral pleural effusions    - s/p throacentesis Sept 2024   - consultation for IR with US guided throacentesis - therapeutic    -patient on heparin drip   Sepsis due to right groin cellulitis   S/p vascular surgery eval S/p vanco /rocephin  - patient appears to be clinically improved.     Thank you for allowing me to participate in the care of this patient.   Patient/Family are satisfied with care plan and all questions have been answered.    Provider disclosure: Patient with at least one acute or chronic illness or injury that poses a threat to life or bodily function and is being managed actively during this encounter.  All of the below services have been performed independently by signing provider:  review of prior documentation from internal and or external health records.  Review of previous and current lab results.  Interview and comprehensive assessment during patient visit today. Review of current and previous chest radiographs/CT scans. Discussion of management and test interpretation with health care team and patient/family.   This document was prepared using Dragon voice recognition software and may include unintentional dictation errors.     Vida Rigger, M.D.  Division of Pulmonary & Critical Care Medicine

## 2023-04-24 NOTE — Plan of Care (Addendum)
Patient has no complaints of pain, R groin wound stable. Planning to go to OR tomorrow for irrigation/cleaning of wound and placement of wound vac. Heparin running at 8 mL/hr. Monitoring I/Os. Patient currently off the unit for thoracentesis of lung.   Problem: Education: Goal: Knowledge of General Education information will improve Description: Including pain rating scale, medication(s)/side effects and non-pharmacologic comfort measures Outcome: Progressing   Problem: Health Behavior/Discharge Planning: Goal: Ability to manage health-related needs will improve Outcome: Progressing   Problem: Clinical Measurements: Goal: Ability to maintain clinical measurements within normal limits will improve Outcome: Progressing Goal: Will remain free from infection Outcome: Progressing Goal: Diagnostic test results will improve Outcome: Progressing Goal: Respiratory complications will improve Outcome: Progressing Goal: Cardiovascular complication will be avoided Outcome: Progressing   Problem: Activity: Goal: Risk for activity intolerance will decrease Outcome: Progressing   Problem: Nutrition: Goal: Adequate nutrition will be maintained Outcome: Progressing   Problem: Coping: Goal: Level of anxiety will decrease Outcome: Progressing   Problem: Elimination: Goal: Will not experience complications related to bowel motility Outcome: Progressing Goal: Will not experience complications related to urinary retention Outcome: Progressing   Problem: Pain Management: Goal: General experience of comfort will improve Outcome: Progressing   Problem: Safety: Goal: Ability to remain free from injury will improve Outcome: Progressing   Problem: Skin Integrity: Goal: Risk for impaired skin integrity will decrease Outcome: Progressing

## 2023-04-24 NOTE — Progress Notes (Signed)
PHARMACY - ANTICOAGULATION CONSULT NOTE  Pharmacy Consult for Heparin Infusion Indication: atrial fibrillation  No Known Allergies  Patient Measurements: Height: 5' 2.99" (160 cm) Weight: 66 kg (145 lb 9.6 oz) IBW/kg (Calculated) : 52.38 Heparin Dosing Weight: 54.4 kg  Vital Signs: Temp: 98.4 F (36.9 C) (12/03 2121) Temp Source: Oral (12/03 2121) BP: 133/67 (12/03 2121) Pulse Rate: 80 (12/03 2121)  Labs: Recent Labs    04/22/23 1528 04/22/23 1528 04/22/23 1540 04/22/23 2259 04/23/23 0631 04/23/23 1430 04/24/23 0202 04/24/23 1132 04/24/23 2136  HGB 10.3*  --   --   --  9.4*  --  9.4*  --   --   HCT 32.4*  --   --   --  29.1*  --  28.5*  --   --   PLT 365  --   --   --  320  --  351  --   --   APTT  --    < > 43*  --  85*   < > 122* 73* 73*  LABPROT  --   --  18.2*  --   --   --   --   --   --   INR  --   --  1.5*  --   --   --   --   --   --   HEPARINUNFRC  --   --   --  >1.10* >1.10*  --  0.80*  --   --   CREATININE 1.17*  --   --   --  1.02*  --  1.14*  --   --    < > = values in this interval not displayed.   Estimated Creatinine Clearance: 36.5 mL/min (A) (by C-G formula based on SCr of 1.14 mg/dL (H)).  Medical History: Past Medical History:  Diagnosis Date   Actinic keratosis    Chronic pain    GERD (gastroesophageal reflux disease) ?   Taking Pepcid   History of basal cell carcinoma (BCC) 05/02/2021   right upper back paraspinal   History of SCC (squamous cell carcinoma) of skin 09/10/2019   left dorsum wrist ED&C done 10/28/19   History of SCC (squamous cell carcinoma) of skin 03/04/2020   right dorsum hand   History of squamous cell carcinoma in situ (SCCIS) 03/04/2020   left dorsum wrist  ED&C   History of squamous cell carcinoma in situ (SCCIS) 03/04/2020   right medial infraorbital  ED&C 04/28/2020   History of squamous cell carcinoma in situ (SCCIS) 10/28/2019   right dorsum hand proximal lateral   History of squamous cell carcinoma in situ  (SCCIS) 10/28/2019   right dorsum hand proximal medial   Hyperlipidemia    Hypertension    NSTEMI (non-ST elevated myocardial infarction) (HCC) 04/12/2023   Renal disorder    Squamous cell carcinoma in situ 10/31/2021   left distal  tricep, EDC   Squamous cell carcinoma of skin 08/28/2022   Right volar forearm - EDC   Assessment: Patient is a 77 year old female with a past medical history of hypertension, recent NSTEMI with left heart cath on 04/13/2023 complicated by iliac artery laceration with retroperitoneal hematoma requiring exploration and repair by vascular surgery who was discharged home 2 days ago. Now presents to hospital with redness, swelling, and increased pain at her right groin incision. Team wants to transition patient from her apixaban to IV heparin for possible surgical debridement. Last dose of apixaban unknown but at  minimum prior to 1500 when she was admitted. Baseline INR 1.5 and aPTT 43. No signs/symptoms of bleeding noted.  Goal of Therapy:  Heparin level 0.3-0.7 units/ml aPTT 66-102 seconds Monitor platelets by anticoagulation protocol: Yes   Plan:  12/3:  aPTT @ 2136 = 73, therapeutic X 2  - Will continue pt on current rate and recheck HL and aPTT on 12/4 with AM labs - Will use aPTT to guide dosing until correlating with HL - Pt scheduled for surgery on 12/4 @ ~ 1200, need to f/u plans for anticoag after surgery  Monitor CBC daily Continue to monitor using aPTT levels until heparin level correlates  Owenn Rothermel D, PharmD 04/24/2023 10:14 PM

## 2023-04-24 NOTE — TOC Initial Note (Signed)
Transition of Care [facility]) - Initial/Assessment Note    Patient Details  Name: Dawn Bruce MRN: [id number] Date of Birth: December 27, 1945  Transition of Care [facility]) CM/SW Contact:    Chapman Fitch, RN Phone Number: 04/24/2023, 6:31 PM  Clinical Narrative:                  Patient was discharged home 11/29 At that time referral was made to [facility] with [facility].  Patient returned to hospital prior to initiation of home health - Previous admission patient was set up with zoll life vest, and received a RW and BSC from [employer] - Plan for OR tomorrow for washout and vac placement. Cory with [facility] notified  Patient would benefit from PT eval post op, if disposition home will need wound vac arranged - ID consulted       Patient Goals and CMS Choice            Expected Discharge Plan and Services                                              Prior Living Arrangements/Services                       Activities of Daily Living   ADL Screening (condition at time of admission) Independently performs ADLs?: Yes (appropriate for developmental age) Is the patient deaf or have difficulty hearing?: No Does the patient have difficulty seeing, even when wearing glasses/contacts?: No Does the patient have difficulty concentrating, remembering, or making decisions?: No  Permission Sought/Granted                  Emotional Assessment              Admission diagnosis:  Cellulitis of right groin [L03.314] Cellulitis of right thigh [L03.115] Patient Active Problem List   Diagnosis Date Noted   HFrEF (heart failure with reduced ejection fraction) (HCC) 04/23/2023   Severe sepsis (HCC) 04/23/2023   Hematoma 04/23/2023   Cellulitis of right thigh 04/23/2023   Cellulitis of right groin 04/22/2023   Sepsis (HCC) 04/22/2023   CAD (coronary artery disease) 04/22/2023   Chronic kidney disease, stage 3a (HCC) 04/22/2023   Peritoneal hematoma 04/22/2023   Pleural  effusion 04/22/2023   Abnormal LFTs 04/22/2023   Hypokalemia 04/22/2023   Atrial fibrillation, chronic (HCC) 04/22/2023   Diarrhea 04/22/2023   Tobacco abuse 04/13/2023   NSTEMI (non-ST elevated myocardial infarction) (HCC) 04/12/2023   Chest pain 04/12/2023   OAB (overactive bladder) 04/27/2022   Chronic diastolic heart failure (HCC) 09/27/2021   Insomnia 09/06/2021   Perforated appendicitis 08/31/2021   Carotid artery occlusion 02/07/2021   Cerebral atherosclerosis 02/07/2021   Hypertensive heart disease without congestive heart failure 02/07/2021   Occlusion and stenosis of bilateral carotid arteries 02/07/2021   Vitamin D deficiency, unspecified 02/07/2021   Chronic anticoagulation 01/27/2021   Senile purpura (HCC) 12/03/2020   Prediabetes 10/08/2019   Stenosis of right carotid artery 10/03/2018   HLD (hyperlipidemia) 10/03/2018   Essential hypertension 10/02/2018   CKD (chronic kidney disease) stage 3, GFR 30-59 ml/min (HCC) 06/17/2018   Chronic forearm pain (Primary Area of Pain) (Left) 06/17/2018   Neurogenic pain 06/17/2018   Chronic pain syndrome 06/06/2018   Long term current use of opiate analgesic 06/06/2018   PCP:  Erasmo Downer, MD Pharmacy:  [id number] - [place], [place] - [address] [place] [address] [place] [address] Phone: [phone] Fax: [phone]  [id number] - [place], [place] - [address] AT [facility] [address] [facility] [address] [place] [address] Phone: [phone] Fax: [phone]     Social Determinants of Health (SDOH) Social History: SDOH Screenings   Food Insecurity: No Food Insecurity (04/23/2023)  Housing: Low Risk  (04/23/2023)  Transportation Needs: No Transportation Needs (04/23/2023)  Utilities: Not At Risk (04/23/2023)  Alcohol Screen: Low Risk  (12/08/2022)  Depression (PHQ2-9): Low Risk  (12/08/2022)  Financial Resource Strain:  Low Risk  (10/13/2022)  Physical Activity: Insufficiently Active (10/13/2022)  Social Connections: Unknown (10/13/2022)  Stress: No Stress Concern Present (10/13/2022)  Tobacco Use: Medium Risk (04/22/2023)   SDOH Interventions:     Readmission Risk Interventions     No data to display

## 2023-04-24 NOTE — Progress Notes (Signed)
PHARMACY - ANTICOAGULATION CONSULT NOTE  Pharmacy Consult for Heparin Infusion Indication: atrial fibrillation  No Known Allergies  Patient Measurements: Height: 5' 2.99" (160 cm) Weight: 66 kg (145 lb 9.6 oz) IBW/kg (Calculated) : 52.38 Heparin Dosing Weight: 54.4 kg  Vital Signs: Temp: 98.6 F (37 C) (12/03 0440) Temp Source: Oral (12/03 0440) BP: 131/72 (12/03 0440) Pulse Rate: 90 (12/03 0440)  Labs: Recent Labs    04/22/23 1528 04/22/23 1528 04/22/23 1540 04/22/23 2259 04/23/23 0631 04/23/23 1430 04/24/23 0202  HGB 10.3*  --   --   --  9.4*  --  9.4*  HCT 32.4*  --   --   --  29.1*  --  28.5*  PLT 365  --   --   --  320  --  351  APTT  --    < > 43*  --  85* 44* 122*  LABPROT  --   --  18.2*  --   --   --   --   INR  --   --  1.5*  --   --   --   --   HEPARINUNFRC  --   --   --  >1.10* >1.10*  --  0.80*  CREATININE 1.17*  --   --   --  1.02*  --  1.14*   < > = values in this interval not displayed.   Estimated Creatinine Clearance: 36.5 mL/min (A) (by C-G formula based on SCr of 1.14 mg/dL (H)).  Medical History: Past Medical History:  Diagnosis Date   Actinic keratosis    Chronic pain    GERD (gastroesophageal reflux disease) ?   Taking Pepcid   History of basal cell carcinoma (BCC) 05/02/2021   right upper back paraspinal   History of SCC (squamous cell carcinoma) of skin 09/10/2019   left dorsum wrist ED&C done 10/28/19   History of SCC (squamous cell carcinoma) of skin 03/04/2020   right dorsum hand   History of squamous cell carcinoma in situ (SCCIS) 03/04/2020   left dorsum wrist  ED&C   History of squamous cell carcinoma in situ (SCCIS) 03/04/2020   right medial infraorbital  ED&C 04/28/2020   History of squamous cell carcinoma in situ (SCCIS) 10/28/2019   right dorsum hand proximal lateral   History of squamous cell carcinoma in situ (SCCIS) 10/28/2019   right dorsum hand proximal medial   Hyperlipidemia    Hypertension    NSTEMI (non-ST  elevated myocardial infarction) (HCC) 04/12/2023   Renal disorder    Squamous cell carcinoma in situ 10/31/2021   left distal  tricep, EDC   Squamous cell carcinoma of skin 08/28/2022   Right volar forearm - EDC   Assessment: Patient is a 77 year old female with a past medical history of hypertension, recent NSTEMI with left heart cath on 04/13/2023 complicated by iliac artery laceration with retroperitoneal hematoma requiring exploration and repair by vascular surgery who was discharged home 2 days ago. Now presents to hospital with redness, swelling, and increased pain at her right groin incision. Team wants to transition patient from her apixaban to IV heparin for possible surgical debridement. Last dose of apixaban unknown but at minimum prior to 1500 when she was admitted. Baseline INR 1.5 and aPTT 43. No signs/symptoms of bleeding noted.  Goal of Therapy:  Heparin level 0.3-0.7 units/ml aPTT 66-102 seconds Monitor platelets by anticoagulation protocol: Yes   Plan: aPTT therapeutic following recent rate change - Will continue heparin infusion  at 800 units/hr and recheck aPTT in 8 hrs to confirm - Will recheck HL on 12/4 with AM labs Monitor CBC daily Continue to monitor using aPTT levels until heparin level correlates  Lowella Bandy, PharmD 04/24/2023 7:06 AM

## 2023-04-24 NOTE — Procedures (Signed)
PROCEDURE SUMMARY: S/P US guided left thoracentesis. Yielded 1.6 L  of dark amber fluid. Patient tolerated procedure well. No immediate complications. EBL = trace  Post procedure chest X-ray reveals no pneumothorax  Haleema Vanderheyden S Shasta Chinn PA-C 04/24/2023 4:13 PM

## 2023-04-25 ENCOUNTER — Inpatient Hospital Stay: Payer: Medicare Other | Admitting: Anesthesiology

## 2023-04-25 ENCOUNTER — Encounter
Admission: EM | Disposition: A | Discharge status: Home Health | Payer: Self-pay | Source: Home / Self Care | Attending: Internal Medicine

## 2023-04-25 ENCOUNTER — Encounter: Payer: Self-pay | Admitting: Internal Medicine

## 2023-04-25 ENCOUNTER — Other Ambulatory Visit: Payer: Self-pay

## 2023-04-25 DIAGNOSIS — L089 Local infection of the skin and subcutaneous tissue, unspecified: Secondary | ICD-10-CM | POA: Diagnosis not present

## 2023-04-25 DIAGNOSIS — B952 Enterococcus as the cause of diseases classified elsewhere: Secondary | ICD-10-CM | POA: Diagnosis not present

## 2023-04-25 DIAGNOSIS — T8142XA Infection following a procedure, deep incisional surgical site, initial encounter: Secondary | ICD-10-CM

## 2023-04-25 DIAGNOSIS — L03115 Cellulitis of right lower limb: Secondary | ICD-10-CM | POA: Diagnosis not present

## 2023-04-25 DIAGNOSIS — T8131XA Disruption of external operation (surgical) wound, not elsewhere classified, initial encounter: Secondary | ICD-10-CM

## 2023-04-25 DIAGNOSIS — T148XXA Other injury of unspecified body region, initial encounter: Secondary | ICD-10-CM | POA: Diagnosis not present

## 2023-04-25 DIAGNOSIS — I97630 Postprocedural hematoma of a circulatory system organ or structure following a cardiac catheterization: Secondary | ICD-10-CM

## 2023-04-25 DIAGNOSIS — I482 Chronic atrial fibrillation, unspecified: Secondary | ICD-10-CM | POA: Diagnosis not present

## 2023-04-25 HISTORY — PX: APPLICATION OF WOUND VAC: SHX5189

## 2023-04-25 HISTORY — PX: INCISION AND DRAINAGE OF WOUND: SHX1803

## 2023-04-25 LAB — HEPARIN LEVEL (UNFRACTIONATED): Heparin Unfractionated: 0.3 [IU]/mL (ref 0.30–0.70)

## 2023-04-25 LAB — CBC
HCT: 27 % — ABNORMAL LOW (ref 36.0–46.0)
Hemoglobin: 8.8 g/dL — ABNORMAL LOW (ref 12.0–15.0)
MCH: 28 pg (ref 26.0–34.0)
MCHC: 32.6 g/dL (ref 30.0–36.0)
MCV: 86 fL (ref 80.0–100.0)
Platelets: 323 10*3/uL (ref 150–400)
RBC: 3.14 MIL/uL — ABNORMAL LOW (ref 3.87–5.11)
RDW: 16.8 % — ABNORMAL HIGH (ref 11.5–15.5)
WBC: 12.4 10*3/uL — ABNORMAL HIGH (ref 4.0–10.5)
nRBC: 0 % (ref 0.0–0.2)

## 2023-04-25 LAB — APTT: aPTT: 75 s — ABNORMAL HIGH (ref 24–36)

## 2023-04-25 SURGERY — IRRIGATION AND DEBRIDEMENT WOUND
Anesthesia: General | Laterality: Right

## 2023-04-25 MED ORDER — ONDANSETRON HCL 4 MG/2ML IJ SOLN
INTRAMUSCULAR | Status: DC | PRN
  Administered 2023-04-25: 4 mg via INTRAVENOUS

## 2023-04-25 MED ORDER — HEPARIN (PORCINE) 25000 UT/250ML-% IV SOLN
900.0000 [IU]/h | INTRAVENOUS | Status: DC
  Administered 2023-04-25: 800 [IU]/h via INTRAVENOUS
  Administered 2023-04-26: 900 [IU]/h via INTRAVENOUS
  Filled 2023-04-25: qty 250

## 2023-04-25 MED ORDER — EPHEDRINE SULFATE (PRESSORS) 50 MG/ML IJ SOLN
10.0000 mg | Freq: Once | INTRAMUSCULAR | Status: AC
  Administered 2023-04-25: 10 mg via INTRAVENOUS
  Filled 2023-04-25: qty 0.2

## 2023-04-25 MED ORDER — FENTANYL CITRATE (PF) 100 MCG/2ML IJ SOLN
INTRAMUSCULAR | Status: AC
  Filled 2023-04-25: qty 2

## 2023-04-25 MED ORDER — ACETAMINOPHEN 10 MG/ML IV SOLN
INTRAVENOUS | Status: DC | PRN
  Administered 2023-04-25: 800 mg via INTRAVENOUS

## 2023-04-25 MED ORDER — ALBUMIN HUMAN 5 % IV SOLN
INTRAVENOUS | Status: AC
  Filled 2023-04-25: qty 250

## 2023-04-25 MED ORDER — ONDANSETRON HCL 4 MG/2ML IJ SOLN: 4.0000 mg | Freq: Once | INTRAMUSCULAR | Status: DC | PRN

## 2023-04-25 MED ORDER — PROPOFOL 10 MG/ML IV BOLUS
INTRAVENOUS | Status: DC | PRN
  Administered 2023-04-25: 100 mg via INTRAVENOUS

## 2023-04-25 MED ORDER — PHENYLEPHRINE HCL-NACL 20-0.9 MG/250ML-% IV SOLN
INTRAVENOUS | Status: DC | PRN
  Administered 2023-04-25: 30 ug/min via INTRAVENOUS

## 2023-04-25 MED ORDER — PHENYLEPHRINE 80 MCG/ML (10ML) SYRINGE FOR IV PUSH (FOR BLOOD PRESSURE SUPPORT)
PREFILLED_SYRINGE | INTRAVENOUS | Status: DC | PRN
  Administered 2023-04-25: 80 ug via INTRAVENOUS
  Administered 2023-04-25: 160 ug via INTRAVENOUS
  Administered 2023-04-25: 80 ug via INTRAVENOUS

## 2023-04-25 MED ORDER — EPHEDRINE SULFATE (PRESSORS) 50 MG/ML IJ SOLN
INTRAMUSCULAR | Status: AC
  Filled 2023-04-25: qty 1

## 2023-04-25 MED ORDER — VASOPRESSIN 20 UNIT/ML IV SOLN
INTRAVENOUS | Status: DC | PRN
  Administered 2023-04-25 (×3): 1 [IU] via INTRAVENOUS

## 2023-04-25 MED ORDER — EPHEDRINE SULFATE-NACL 50-0.9 MG/10ML-% IV SOSY
PREFILLED_SYRINGE | INTRAVENOUS | Status: DC | PRN
  Administered 2023-04-25: 10 mg via INTRAVENOUS
  Administered 2023-04-25 (×3): 5 mg via INTRAVENOUS

## 2023-04-25 MED ORDER — ACETAMINOPHEN 10 MG/ML IV SOLN: 1000.0000 mg | Freq: Once | INTRAVENOUS | Status: DC | PRN

## 2023-04-25 MED ORDER — MIDAZOLAM HCL 2 MG/2ML IJ SOLN
INTRAMUSCULAR | Status: DC | PRN
  Administered 2023-04-25: 2 mg via INTRAVENOUS

## 2023-04-25 MED ORDER — LACTATED RINGERS IV SOLN
INTRAVENOUS | Status: AC
Start: 2023-04-25 — End: 2023-04-25

## 2023-04-25 MED ORDER — CEFAZOLIN SODIUM-DEXTROSE 2-4 GM/100ML-% IV SOLN
INTRAVENOUS | Status: AC
Start: 2023-04-25 — End: ?
  Filled 2023-04-25: qty 100

## 2023-04-25 MED ORDER — LACTATED RINGERS IV SOLN
INTRAVENOUS | Status: DC | PRN
Start: 2023-04-25 — End: 2023-04-25

## 2023-04-25 MED ORDER — FENTANYL CITRATE (PF) 100 MCG/2ML IJ SOLN
INTRAMUSCULAR | Status: DC | PRN
  Administered 2023-04-25 (×2): 50 ug via INTRAVENOUS

## 2023-04-25 MED ORDER — PHENYLEPHRINE HCL-NACL 20-0.9 MG/250ML-% IV SOLN
INTRAVENOUS | Status: AC
  Filled 2023-04-25: qty 250

## 2023-04-25 MED ORDER — MIDAZOLAM HCL 2 MG/2ML IJ SOLN
INTRAMUSCULAR | Status: AC
Start: 2023-04-25 — End: ?
  Filled 2023-04-25: qty 2

## 2023-04-25 MED ORDER — LIDOCAINE HCL (PF) 2 % IJ SOLN
INTRAMUSCULAR | Status: DC | PRN
  Administered 2023-04-25: 100 mg via INTRADERMAL

## 2023-04-25 MED ORDER — FENTANYL CITRATE (PF) 100 MCG/2ML IJ SOLN
25.0000 ug | INTRAMUSCULAR | Status: DC | PRN
  Administered 2023-04-25 (×4): 25 ug via INTRAVENOUS

## 2023-04-25 MED ORDER — ALBUMIN HUMAN 5 % IV SOLN
12.5000 g | Freq: Once | INTRAVENOUS | Status: AC
  Administered 2023-04-25: 12.5 g via INTRAVENOUS
  Filled 2023-04-25: qty 250

## 2023-04-25 MED ORDER — ACETAMINOPHEN 10 MG/ML IV SOLN
INTRAVENOUS | Status: AC
  Filled 2023-04-25: qty 100

## 2023-04-25 MED ORDER — SODIUM CHLORIDE 0.9 % IR SOLN
Status: DC | PRN
  Administered 2023-04-25: 400 mL

## 2023-04-25 MED ORDER — ALBUMIN HUMAN 25 % IV SOLN
INTRAVENOUS | Status: DC | PRN
Start: 2023-04-25 — End: 2023-04-25

## 2023-04-25 SURGICAL SUPPLY — 19 items
CANISTER WOUND CARE 500ML ATS (WOUND CARE) IMPLANT
DRAPE INCISE IOBAN 66X45 STRL (DRAPES) IMPLANT
DRAPE LAPAROTOMY 100X77 ABD (DRAPES) IMPLANT
DRSG VAC GRANUFOAM LG (GAUZE/BANDAGES/DRESSINGS) IMPLANT
DRSG VAC GRANUFOAM MED (GAUZE/BANDAGES/DRESSINGS) IMPLANT
ELECT REM PT RETURN 9FT ADLT (ELECTROSURGICAL) ×1
ELECTRODE REM PT RTRN 9FT ADLT (ELECTROSURGICAL) IMPLANT
GLOVE BIO SURGEON STRL SZ7 (GLOVE) IMPLANT
GLOVE SURG SYN 8.0 (GLOVE) ×1
GLOVE SURG SYN 8.0 PF PI (GLOVE) IMPLANT
GOWN STRL REUS W/ TWL LRG LVL3 (GOWN DISPOSABLE) IMPLANT
GOWN STRL REUS W/ TWL XL LVL3 (GOWN DISPOSABLE) IMPLANT
KIT TURNOVER KIT A (KITS) IMPLANT
MANIFOLD NEPTUNE II (INSTRUMENTS) IMPLANT
NS IRRIG 500ML POUR BTL (IV SOLUTION) IMPLANT
PACK BASIN MINOR ARMC (MISCELLANEOUS) IMPLANT
SOL PREP PVP 2OZ (MISCELLANEOUS) ×1
SOLUTION PREP PVP 2OZ (MISCELLANEOUS) IMPLANT
TRAP FLUID SMOKE EVACUATOR (MISCELLANEOUS) IMPLANT

## 2023-04-25 NOTE — Progress Notes (Signed)
PROGRESS NOTE  Dawn Bruce    DOB: [birth year], 77 y.o.  XLK:[id number]    Code Status: Full Code   DOA: 04/22/2023   LOS: 3   Brief hospital course  Dawn Bruce is a 77 y.o. female with medical history significant of dCHF, CAD (s/p of recent DES), A fib on Eliquis, HTN, HLD, CDK-3a, chronic pain syndrome, multiple skin cancer, perforated appendicitis, former smoker, who presents with right groin pain.   Patient was recently hospitalized from 11/21 - 11/29 due to non-STEMI.  Patient is s/p of LHC with DES placement. Pt developed retroperitoneal hematoma & hemorrhagic shock due to complication of Iliac artery laceration during left heart cath of right groin. Pt underwent surgical repair of external iliac artery by [facility] on 11/23.   Presented back to ED due to increased redness, swelling, and pain at incision site of the cath. Failed outpatient Abx x1 dose.  Hypotensive to 80/50 on presentation.    ED Course: pt was found to have WBC 21.3, BNP 626, stable renal function, potassium 3.3, Mg 2.3, phosphorus 2.7, lactic acid of 1.7, C diff negative. Temperature normal, blood pressure 141/74, heart rate 104, RR 29, oxygen saturation 98% on room air.   Dr. Myra Gianotti of [facility] is consulted by EDP. [id number] development of moderate size left pleural effusion with probable underlying atelectasis or infiltrate. CT-abd/pelvis: Moderate sized right retroperitoneal hematoma is noted which extends into right pelvic region and displaces urinary bladder to the left. The pelvic component appears to be mildly enlarged compared to prior exam. Bilateral pleural effusions are noted, left greater than right, with associated atelectasis of the lower lobes. Likely from her HF.   EKG:  QTc 532, T wave inversion in inferior leads and V2-V6  They were initially treated with potassium, CTX, heparin gtt, IV fluids, vancomycin, ibuprofen.   Patient was admitted to medicine service for further workup and management of  cellulitis as outlined in detail below.  12/2: pulmonology, and IR consulted to address pleural effusion. ID consulted to address abx course. Transitioned to linezolid. Vascular evaluated and recommended debridement 12/4. Wound care continues. Afebrile.  12/3: vitals stable, afebrile. Wound appears much improved and pain has improved. Debridement planned for tomorrow. Thoracentesis removed 1.6L left side 04/25/23 - stable ORA, afebrile. Wound clinically greatly improved. Debridement today. Plan to stop heparin gtt and return to eliquis + DAPT s/p procedure  Assessment & Plan  Principal Problem:   Cellulitis of right groin Active Problems:   Sepsis (HCC)   CAD (coronary artery disease)   Peritoneal hematoma   Essential hypertension   Chronic diastolic heart failure (HCC)   Hypokalemia   Chronic kidney disease, stage 3a (HCC)   Pleural effusion   Abnormal LFTs   Atrial fibrillation, chronic (HCC)   Diarrhea   Chronic pain syndrome   HLD (hyperlipidemia)   HFrEF (heart failure with reduced ejection fraction) (HCC)   Severe sepsis (HCC)   Infected wound   Cellulitis of right thigh  Sepsis due to cellulitis of right groin: sepsis criteria met on admission with WBC 21.3, heart rate 104, RR 29.  Lactic acid normal 1.7.  infection source is superficial groin. Dr. Myra Gianotti of [facility] removed several staples, probed and packed wound of right groin and she was started on IV Abx in ED.  WBC improving, she remains afebrile. Wound showing improvement in drainage and redness today. Foul smell improved as well. BxCx NGTD. Wound cultures pending.  - ID following  - linezolid + cefazolin - follow  wound culture, blood culture - follow VSS. Debridement with wound vac placement today - PT starting today - analgesia PRN  Hypervolemia  Bilateral Pleural effusions- incidental findings- L worse than R. Denies respiratory symptoms. Had recent echo showing HFrEF. Stable ORA. Of note, had thoracentesis 9/22.  Thoracentesis 12/3 L chest 1.6L removed.  - pulmonology consulted, IR for thoracentesis.  - watch respiratory status closely - trial of IV lasix helped her edema.    Hx of CAD: s/p of recent DES. - hold the Plavix, and switch Eliquis to IV heparin. - plan to restart plavix s/p procedure -continue ASA, heparin gtt -close monitoring of chest pain development.    Peritoneal hematoma: Hgb mild drop 10.3>>>8.8.Blood loss from wound is minimal. - closely monitor hematoma on heparin gtt -Follow-up CBC am - transfusion threshold is <8   Essential hypertension: Blood pressure stable -Continue Coreg which is also for A-fib - holding other home meds for transient hypotension   HFrEF- EF 25-30% on echo 11/24. Considerable edema of all extremities -Watch volume status closely s/p trial of IV diuretics   Hypokalemia: Potassium 3.3.  Magnesium 2.3, phosphorus 2.7 -Repleted potassium   Chronic kidney disease, stage 3a (HCC): Stable. Cr 1.02>1.14 -Follow-up with BMP am    Abnormal LFTs: her liver function has been improving.  She has ALP 80, AST 54, ALT 83, total bilirubin 2.8, which is better than recent liver function.   Atrial fibrillation, chronic (HCC): rate controlled -Switch Eliquis to IV heparin -Continue Coreg   Diarrhea: C. difficile negative. -As needed Imodium   Chronic pain syndrome -Continue home fentanyl patch and Lyrica   HLD: -lipitor and zetia  Body mass index is 25.8 kg/m.  VTE ppx: heparin gtt  Diet:     Diet   Diet NPO time specified   Consultants: Vascular surgery ID Pulmonology IR  Subjective 04/25/23    Pt reports feeling overall well. Shows some anxiety for her procedure. Looking forward to getting out of bed afterwards   Objective   Vitals:   04/24/23 0835 04/24/23 1551 04/24/23 1613 04/24/23 2121  BP: (!) 146/77 (!) 136/56 (!) 97/54 133/67  Pulse: 86 83 83 80  Resp: 16   16  Temp: 98.4 F (36.9 C)   98.4 F (36.9 C)  TempSrc: Oral    Oral  SpO2: 97% (!) 88% 100% 97%  Weight:      Height:        Intake/Output Summary (Last 24 hours) at 04/25/2023 0735 Last data filed at 04/25/2023 0981 Gross per 24 hour  Intake 1820.15 ml  Output 600 ml  Net 1220.15 ml   Filed Weights   04/22/23 2200 04/24/23 0500  Weight: 54.4 kg 66 kg     Physical Exam:  General: awake, alert, NAD HEENT: atraumatic, clear conjunctiva, anicteric sclera, MMM, hearing grossly normal Respiratory: normal respiratory effort. Cardiovascular: quick capillary refill, normal S1/S2, RRR, no JVD, murmurs Gastrointestinal: soft, NT, ND Nervous: A&O x3. no gross focal neurologic deficits, normal speech Extremities: 1+ pitting edema of all extremities Skin: R groin- staples in place, granulation tissue with mild drainage. Small amount of serosanguinous drainage. Foul odor lessened. Erythema, has greatly improved. Normal temp. Psychiatry: normal mood, congruent affect  Labs   I have personally reviewed the following labs and imaging studies CBC    Component Value Date/Time   WBC 12.4 (H) 04/25/2023 0453   RBC 3.14 (L) 04/25/2023 0453   HGB 8.8 (L) 04/25/2023 0453   HGB 13.6 06/01/2022 0000  HCT 27.0 (L) 04/25/2023 0453   HCT 40.5 06/01/2022 0000   PLT 323 04/25/2023 0453   PLT 132 (L) 06/01/2022 0000   MCV 86.0 04/25/2023 0453   MCV 90 06/01/2022 0000   MCH 28.0 04/25/2023 0453   MCHC 32.6 04/25/2023 0453   RDW 16.8 (H) 04/25/2023 0453   RDW 12.8 06/01/2022 0000   LYMPHSABS 0.7 04/22/2023 1528   LYMPHSABS 0.7 08/30/2021 0917   MONOABS 1.2 (H) 04/22/2023 1528   EOSABS 0.2 04/22/2023 1528   EOSABS 0.0 08/30/2021 0917   BASOSABS 0.0 04/22/2023 1528   BASOSABS 0.0 08/30/2021 0917      Latest Ref Rng & Units 04/24/2023    2:02 AM 04/23/2023    6:31 AM 04/22/2023    3:28 PM  BMP  Glucose 70 - 99 mg/dL 188  97  416   BUN 8 - 23 mg/dL 25  23  30    Creatinine 0.44 - 1.00 mg/dL 6.06  3.01  6.01   Sodium 135 - 145 mmol/L 139  141  137    Potassium 3.5 - 5.1 mmol/L 3.3  3.5  3.3   Chloride 98 - 111 mmol/L 105  109  104   CO2 22 - 32 mmol/L 26  22  27    Calcium 8.9 - 10.3 mg/dL 7.6  7.9  8.0     DG Chest Port 1 View  Result Date: 04/24/2023 CLINICAL DATA:  Status post left thoracentesis. EXAM: PORTABLE CHEST 1 VIEW COMPARISON:  April 22, 2023. FINDINGS: The heart size and mediastinal contours are within normal limits. Minimal bibasilar subsegmental atelectasis is noted with small pleural effusions. Right-sided PICC line is noted with distal tip in expected position of the SVC. The visualized skeletal structures are unremarkable. IMPRESSION: Minimal bibasilar subsegmental atelectasis with small pleural effusions. Electronically Signed   By: Lupita Raider M.D.   On: 04/24/2023 16:59   US THORACENTESIS ASP PLEURAL SPACE W/IMG GUIDE  Result Date: 04/24/2023 INDICATION: Congestive heart failure with left pleural effusion. Request for therapeutic thoracentesis. EXAM: ULTRASOUND GUIDED LEFT THORACENTESIS MEDICATIONS: 1% lidocaine 10 mL COMPLICATIONS: None immediate. PROCEDURE: An ultrasound guided thoracentesis was thoroughly discussed with the patient and questions answered. The benefits, risks, alternatives and complications were also discussed. The patient understands and wishes to proceed with the procedure. Written consent was obtained. Ultrasound was performed to localize and mark an adequate pocket of fluid in the left chest. The area was then prepped and draped in the normal sterile fashion. 1% Lidocaine was used for local anesthesia. Under ultrasound guidance a 6 Fr Safe-T-Centesis catheter was introduced. Thoracentesis was performed. The catheter was removed and a dressing applied. FINDINGS: A total of approximately 1.6 L of dark amber fluid was removed. IMPRESSION: Successful ultrasound guided left thoracentesis yielding 1.6 L of pleural fluid. No pneumothorax on post-procedure chest x-ray. Procedure performed by: Corrin Parker,  PA-C Electronically Signed   By: Gilmer Mor D.O.   On: 04/24/2023 16:45   [place] EKG SITE RITE  Result Date: 04/23/2023 If Site Rite image not attached, placement could not be confirmed due to current cardiac rhythm.   Disposition Plan & Communication  Patient status: Inpatient  Admitted From: Home Planned disposition location: Home Anticipated discharge date: TBD pending clinical improvement  Family Communication: spouse at bedside    Author: Leeroy Bock, DO [facility] 04/25/2023, 7:35 AM   Available by Epic secure chat 7AM-7PM. If 7PM-7AM, please contact night-coverage.  [facility] contact information found on [email or web address].

## 2023-04-25 NOTE — Interval H&P Note (Signed)
History and Physical Interval Note:  04/25/2023 2:17 PM  Dawn Bruce  has presented today for surgery, with the diagnosis of Right Groin wound infection.  The various methods of treatment have been discussed with the patient and family. After consideration of risks, benefits and other options for treatment, the patient has consented to  Procedure(s): IRRIGATION AND DEBRIDEMENT WOUND (Right) APPLICATION OF WOUND VAC (Right) as a surgical intervention.  The patient's history has been reviewed, patient examined, no change in status, stable for surgery.  I have reviewed the patient's chart and labs.  Questions were answered to the patient's satisfaction.     [name]

## 2023-04-25 NOTE — Progress Notes (Addendum)
PHARMACY - ANTICOAGULATION CONSULT NOTE  Pharmacy Consult for Heparin Infusion Indication: atrial fibrillation  No Known Allergies  Patient Measurements: Height: 5' 2.99" (160 cm) Weight: 66 kg (145 lb 9.6 oz) IBW/kg (Calculated) : 52.38 Heparin Dosing Weight: 54.4 kg  Vital Signs: Temp: 98.4 F (36.9 C) (12/03 2121) Temp Source: Oral (12/03 2121) BP: 133/67 (12/03 2121) Pulse Rate: 80 (12/03 2121)  Labs: Recent Labs     0000 04/22/23 1528 04/22/23 1540 04/22/23 2259 04/23/23 0631 04/23/23 1430 04/24/23 0202 04/24/23 1132 04/24/23 2136 04/25/23 0453  HGB  --  10.3*  --   --  9.4*  --  9.4*  --   --  8.8*  HCT  --  32.4*  --   --  29.1*  --  28.5*  --   --  27.0*  PLT  --  365  --   --  320  --  351  --   --  323  APTT   < >  --  43*  --  85*   < > 122* 73* 73* 75*  LABPROT  --   --  18.2*  --   --   --   --   --   --   --   INR  --   --  1.5*  --   --   --   --   --   --   --   HEPARINUNFRC  --   --   --    < > >1.10*  --  0.80*  --   --  0.30  CREATININE  --  1.17*  --   --  1.02*  --  1.14*  --   --   --    < > = values in this interval not displayed.   Estimated Creatinine Clearance: 36.5 mL/min (A) (by C-G formula based on SCr of 1.14 mg/dL (H)).  Medical History: Past Medical History:  Diagnosis Date   Actinic keratosis    Chronic pain    GERD (gastroesophageal reflux disease) ?   Taking Pepcid   History of basal cell carcinoma (BCC) 05/02/2021   right upper back paraspinal   History of SCC (squamous cell carcinoma) of skin 09/10/2019   left dorsum wrist ED&C done 10/28/19   History of SCC (squamous cell carcinoma) of skin 03/04/2020   right dorsum hand   History of squamous cell carcinoma in situ (SCCIS) 03/04/2020   left dorsum wrist  ED&C   History of squamous cell carcinoma in situ (SCCIS) 03/04/2020   right medial infraorbital  ED&C 04/28/2020   History of squamous cell carcinoma in situ (SCCIS) 10/28/2019   right dorsum hand proximal lateral    History of squamous cell carcinoma in situ (SCCIS) 10/28/2019   right dorsum hand proximal medial   Hyperlipidemia    Hypertension    NSTEMI (non-ST elevated myocardial infarction) (HCC) 04/12/2023   Renal disorder    Squamous cell carcinoma in situ 10/31/2021   left distal  tricep, EDC   Squamous cell carcinoma of skin 08/28/2022   Right volar forearm - EDC   Assessment: Patient is a 77 year old female with a past medical history of hypertension, recent NSTEMI with left heart cath on 04/13/2023 complicated by iliac artery laceration with retroperitoneal hematoma requiring exploration and repair by vascular surgery who was discharged home 2 days ago. Now presents to hospital with redness, swelling, and increased pain at her right groin incision. Team wants to  transition patient from her apixaban to IV heparin for possible surgical debridement. Last dose of apixaban unknown but at minimum prior to 1500 when she was admitted. Baseline INR 1.5 and aPTT 43. No signs/symptoms of bleeding noted.  Goal of Therapy:  Heparin level 0.3-0.7 units/ml aPTT 66-102 seconds Monitor platelets by anticoagulation protocol: Yes   Plan:  12/4 @ 0453:  aPTT = 75,  HL = 0.30 - aPTT is therapeutic X 3 , now correlating with HL - will use HL to guide dosing from here on - Pt scheduled for surgery on 12/4 @ ~ 1200, need to f/u plans for anticoag after surgery. - messaged NP who said not to d/c heparin drip prior to surgery - no additional labs ordered.   Monitor CBC daily  Jalaysia Lobb D, PharmD 04/25/2023 5:31 AM

## 2023-04-25 NOTE — Progress Notes (Signed)
PULMONOLOGY         Date: 04/25/2023,   MRN# [id number] Dawn Bruce 1945/06/18     AdmissionWeight: 54.4 kg                 CurrentWeight: 66 kg  Referring provider: Dr Gilda Crease   CHIEF COMPLAINT:   Complete atelectasis of left lung with bilateral pleural effusions.    HISTORY OF PRESENT ILLNESS   77 year old female with a history of chronic GERD, basal cell carcinoma squamous cell carcinoma history of NSTEMI diastolic CHF, CAD status post recent PCI with DES, chronic A-fib on Eliquis, essential hypertension, dyslipidemia CKD 3A chronic pain syndrome remote perforated appendicitis, hospitalized November 2024 with non-STEMI.  Post left heart cath had developed retroperitoneal hematoma and hemorrhagic shock with complications of iliac artery laceration.  Status post surgical repair via vascular surgery service.  Had outpatient therapy with antibiotics with failure came in with transient hypotension and circulatory shock.  Found to have elevated cardiac biomarkers and acute on chronic CHF.  Vascular surgery was reconsulted today and PCCM consultation was placed to incidental finding of complete atelectasis of the left lung with surrounding bilateral pleural effusions worse on the left.  04/25/23- patient evaluated in PACU post op , appears to be mildy sedated still. Mildy hypotensive at this moment.  Lung sounds much improved post 1.6L left pleural space aspiration.     PAST MEDICAL HISTORY   Past Medical History:  Diagnosis Date   Actinic keratosis    Chronic pain    GERD (gastroesophageal reflux disease) ?   Taking Pepcid   History of basal cell carcinoma (BCC) 05/02/2021   right upper back paraspinal   History of SCC (squamous cell carcinoma) of skin 09/10/2019   left dorsum wrist ED&C done 10/28/19   History of SCC (squamous cell carcinoma) of skin 03/04/2020   right dorsum hand   History of squamous cell carcinoma in situ (SCCIS) 03/04/2020   left dorsum wrist   ED&C   History of squamous cell carcinoma in situ (SCCIS) 03/04/2020   right medial infraorbital  ED&C 04/28/2020   History of squamous cell carcinoma in situ (SCCIS) 10/28/2019   right dorsum hand proximal lateral   History of squamous cell carcinoma in situ (SCCIS) 10/28/2019   right dorsum hand proximal medial   Hyperlipidemia    Hypertension    NSTEMI (non-ST elevated myocardial infarction) (HCC) 04/12/2023   Renal disorder    Squamous cell carcinoma in situ 10/31/2021   left distal  tricep, EDC   Squamous cell carcinoma of skin 08/28/2022   Right volar forearm - EDC     SURGICAL HISTORY   Past Surgical History:  Procedure Laterality Date   ABDOMINAL HYSTERECTOMY     APPENDECTOMY  April 2023   Burst appendix   AUGMENTATION MAMMAPLASTY Bilateral    25-30 years ago   CARDIOVERSION N/A 12/01/2020   Procedure: CARDIOVERSION;  Surgeon: Debbe Odea, MD;  Location: [facility] ORS;  Service: Cardiovascular;  Laterality: N/A;   CAROTID ARTERY ANGIOPLASTY Right 1995(approximate)   CAROTID ENDARTERECTOMY  2001   CORONARY STENT INTERVENTION N/A 04/13/2023   Procedure: CORONARY STENT INTERVENTION;  Surgeon: Marcina Millard, MD;  Location: [facility];  Service: Cardiovascular;  Laterality: N/A;   COSMETIC SURGERY     ELBOW SURGERY Left    ENDARTERECTOMY FEMORAL Right 04/13/2023   Procedure: Right groin exploration with repair of right external iliac artery and right circumflex artery;  Surgeon: Bertram Denver, MD;  Location: [facility] ORS;  Service: Vascular;  Laterality: Right;   FRACTURE SURGERY  2016   Arm   LEFT HEART CATH AND CORONARY ANGIOGRAPHY N/A 04/13/2023   Procedure: LEFT HEART CATH AND CORONARY ANGIOGRAPHY;  Surgeon: Marcina Millard, MD;  Location: [facility];  Service: Cardiovascular;  Laterality: N/A;   THORACENTESIS N/A 12/27/2020   Procedure: Alanson Puls;  Surgeon: Josephine Igo, DO;  Location: [facility];  Service: Pulmonary;   Laterality: N/A;   TONSILLECTOMY     TUBAL LIGATION  ?     FAMILY HISTORY   Family History  Problem Relation Age of Onset   Cancer Mother    Cancer Father    Cancer Sister    Breast cancer Neg Hx      SOCIAL HISTORY   Social History   Tobacco Use   Smoking status: Former    Current packs/day: 0.00    Average packs/day: 0.3 packs/day for 15.0 years (3.8 ttl pk-yrs)    Types: Cigarettes    Start date: 63    Quit date: 30    Years since quitting: 34.9    Passive exposure: Past   Smokeless tobacco: Never   Tobacco comments:    quit around 1990-1995 (?)  Vaping Use   Vaping status: Never Used  Substance Use Topics   Alcohol use: Yes    Alcohol/week: 14.0 standard drinks of alcohol    Types: 14 Standard drinks or equivalent per week    Comment: 2 scotch shots   Drug use: Never     MEDICATIONS    Home Medication:     Current Medication:  Current Facility-Administered Medications:    0.9 %  sodium chloride infusion, , Intravenous, Continuous, Pace, Brien R, NP, Last Rate: 75 mL/hr at 04/24/23 2303, New Bag at 04/24/23 2303   acetaminophen (TYLENOL) tablet 650 mg, 650 mg, Oral, Q6H PRN, Leeroy Bock, MD   albuterol (PROVENTIL) (2.5 MG/3ML) 0.083% nebulizer solution 2.5 mg, 2.5 mg, Nebulization, Q4H PRN, Belue, Nathan S, RPH   alum & mag hydroxide-simeth (MAALOX/MYLANTA) 200-200-20 MG/5ML suspension 15 mL, 15 mL, Oral, Once, Jawo, Modou L, NP   aspirin EC tablet 81 mg, 81 mg, Oral, Daily, Jamelle Rushing L, MD, 81 mg at 04/24/23 0949   atorvastatin (LIPITOR) tablet 80 mg, 80 mg, Oral, Daily, Lorretta Harp, MD, 80 mg at 04/24/23 0949   carvedilol (COREG) tablet 12.5 mg, 12.5 mg, Oral, BID, Lorretta Harp, MD, 12.5 mg at 04/24/23 2125   ceFAZolin (ANCEF) IVPB 2g/100 mL premix, 2 g, Intravenous, 30 min Pre-Op, Anderson, Chelsey L, MD   ceFEPIme (MAXIPIME) 2 g in sodium chloride 0.9 % 100 mL IVPB, 2 g, Intravenous, Q12H, Tressie Ellis, RPH, Last Rate: 200 mL/hr  at 04/25/23 0613, 2 g at 04/25/23 8657   Chlorhexidine Gluconate Cloth 2 % PADS 6 each, 6 each, Topical, Daily, Leeroy Bock, MD, 6 each at 04/24/23 1649   diphenhydrAMINE (BENADRYL) injection 50 mg, 50 mg, Intravenous, Once PRN, Pace, Brien R, NP   ezetimibe (ZETIA) tablet 10 mg, 10 mg, Oral, Daily, Lorretta Harp, MD, 10 mg at 04/24/23 0949   famotidine (PEPCID) tablet 40 mg, 40 mg, Oral, Once PRN, Pace, Brien R, NP   fentaNYL (DURAGESIC) 75 MCG/HR 1 patch, 1 patch, Transdermal, Q72H, Lorretta Harp, MD, 1 patch at 04/23/23 0951   heparin ADULT infusion 100 units/mL (25000 units/285mL), 800 Units/hr, Intravenous, Continuous, Pace, Brien R, NP, Last Rate: 8 mL/hr at 04/24/23 0531, 800 Units/hr at 04/24/23  0531   ibuprofen (ADVIL) tablet 200 mg, 200 mg, Oral, Q6H PRN, Lorretta Harp, MD, 200 mg at 04/22/23 2254   linezolid (ZYVOX) IVPB 600 mg, 600 mg, Intravenous, Q12H, Ravishankar, Rhodia Albright, MD, Last Rate: 300 mL/hr at 04/24/23 2129, 600 mg at 04/24/23 2129   loperamide (IMODIUM) capsule 2 mg, 2 mg, Oral, BID PRN, Lorretta Harp, MD   methylPREDNISolone sodium succinate (SOLU-MEDROL) 125 mg/2 mL injection 125 mg, 125 mg, Intravenous, Once PRN, Pace, Brien R, NP   oxyCODONE (Oxy IR/ROXICODONE) immediate release tablet 5 mg, 5 mg, Oral, Q6H PRN, Lorretta Harp, MD, 5 mg at 04/24/23 2126   pantoprazole (PROTONIX) EC tablet 40 mg, 40 mg, Oral, Daily, Lorretta Harp, MD, 40 mg at 04/24/23 0949   phenylephrine ((USE for PREPARATION-H)) 0.25 % suppository 1 suppository, 1 suppository, Rectal, PRN, Lorretta Harp, MD   pregabalin (LYRICA) capsule 150 mg, 150 mg, Oral, BID, Lorretta Harp, MD, 150 mg at 04/24/23 2125   sodium chloride flush (NS) 0.9 % injection 10-40 mL, 10-40 mL, Intracatheter, Q12H, Jamelle Rushing L, MD, 10 mL at 04/24/23 2125   sodium chloride flush (NS) 0.9 % injection 10-40 mL, 10-40 mL, Intracatheter, PRN, Leeroy Bock, MD   traZODone (DESYREL) tablet 25-50 mg, 25-50 mg, Oral, QHS PRN, Lorretta Harp,  MD, 50 mg at 04/24/23 1909    ALLERGIES   Patient has no known allergies.     REVIEW OF SYSTEMS    Review of Systems:  Gen:  Denies  fever, sweats, chills weigh loss  HEENT: Denies blurred vision, double vision, ear pain, eye pain, hearing loss, nose bleeds, sore throat Cardiac:  No dizziness, chest pain or heaviness, chest tightness,edema Resp:   reports dyspnea chronically  Gi: Denies swallowing difficulty, stomach pain, nausea or vomiting, diarrhea, constipation, bowel incontinence Gu:  Denies bladder incontinence, burning urine Ext:   Denies Joint pain, stiffness or swelling Skin: Denies  skin rash, easy bruising or bleeding or hives Endoc:  Denies polyuria, polydipsia , polyphagia or weight change Psych:   Denies depression, insomnia or hallucinations   Other:  All other systems negative   VS: BP 133/67   Pulse 80   Temp 98.4 F (36.9 C) (Oral)   Resp 16   Ht 5' 2.99" (1.6 m)   Wt 66 kg   SpO2 97%   BMI 25.80 kg/m      PHYSICAL EXAM    GENERAL:NAD, no fevers, chills, no weakness no fatigue HEAD: Normocephalic, atraumatic.  EYES: Pupils equal, round, reactive to light. Extraocular muscles intact. No scleral icterus.  MOUTH: Moist mucosal membrane. Dentition intact. No abscess noted.  EAR, NOSE, THROAT: Clear without exudates. No external lesions.  NECK: Supple. No thyromegaly. No nodules. No JVD.  PULMONARY: decreased breath sounds with mild rhonchi worse at bases bilaterally.  CARDIOVASCULAR: S1 and S2. Regular rate and rhythm. No murmurs, rubs, or gallops. No edema. Pedal pulses 2+ bilaterally.  GASTROINTESTINAL: Soft, nontender, nondistended. No masses. Positive bowel sounds. No hepatosplenomegaly.  MUSCULOSKELETAL: No swelling, clubbing, or edema. Range of motion full in all extremities.  NEUROLOGIC: Cranial nerves II through XII are intact. No gross focal neurological deficits. Sensation intact. Reflexes intact.  SKIN: No ulceration, lesions, rashes,  or cyanosis. Skin warm and dry. Turgor intact.  PSYCHIATRIC: Mood, affect within normal limits. The patient is awake, alert and oriented x 3. Insight, judgment intact.       IMAGING     ASSESSMENT/PLAN   Complete atelectasis of left lung   - patient  s/p diuresis with partial improvement however still has minimal lung sounds on left.   - she does have CKD 3 and may not produce adequate fluid removal via medication alone   - we discussed options for thoracentesis and patient would like to proceed with this procedure.   - IR consultation placed for therapeutic tap-amber colored left pleural fluid 1.6L -incentive spirometry at bedside to be used multiple times daily  - PT/OT - etiology is likely poor nutrition post ICU hospitalization with CHF and CKD all contributing to third spaced interstitial edema , peripheral edema and effusion      Bilateral pleural effusions    - s/p throacentesis Sept 2024   - consultation for IR with US guided throacentesis - therapeutic    -patient on heparin drip   Sepsis due to right groin cellulitis   S/p vascular surgery eval S/p vanco /rocephin  - patient appears to be clinically improved.     Thank you for allowing me to participate in the care of this patient.   Patient/Family are satisfied with care plan and all questions have been answered.    Provider disclosure: Patient with at least one acute or chronic illness or injury that poses a threat to life or bodily function and is being managed actively during this encounter.  All of the below services have been performed independently by signing provider:  review of prior documentation from internal and or external health records.  Review of previous and current lab results.  Interview and comprehensive assessment during patient visit today. Review of current and previous chest radiographs/CT scans. Discussion of management and test interpretation with health care team and patient/family.    This document was prepared using Dragon voice recognition software and may include unintentional dictation errors.     Vida Rigger, M.D.  Division of Pulmonary & Critical Care Medicine

## 2023-04-25 NOTE — Progress Notes (Signed)
PHARMACY - ANTICOAGULATION CONSULT NOTE  Pharmacy Consult for Heparin Infusion Indication: atrial fibrillation  No Known Allergies  Patient Measurements: Height: 5' 2.99" (160 cm) Weight: 66 kg (145 lb 9.6 oz) IBW/kg (Calculated) : 52.38 Heparin Dosing Weight: 54.4 kg  Vital Signs: Temp: 97 F (36.1 C) (12/04 1505) Temp Source: Temporal (12/04 1222) BP: 114/45 (12/04 1730) Pulse Rate: 79 (12/04 1730)  Labs: Recent Labs    04/23/23 0631 04/23/23 1430 04/24/23 0202 04/24/23 1132 04/24/23 2136 04/25/23 0453  HGB 9.4*  --  9.4*  --   --  8.8*  HCT 29.1*  --  28.5*  --   --  27.0*  PLT 320  --  351  --   --  323  APTT 85*   < > 122* 73* 73* 75*  HEPARINUNFRC >1.10*  --  0.80*  --   --  0.30  CREATININE 1.02*  --  1.14*  --   --   --    < > = values in this interval not displayed.   Estimated Creatinine Clearance: 36.5 mL/min (A) (by C-G formula based on SCr of 1.14 mg/dL (H)).  Medical History: Past Medical History:  Diagnosis Date   Actinic keratosis    Chronic pain    GERD (gastroesophageal reflux disease) ?   Taking Pepcid   History of basal cell carcinoma (BCC) 05/02/2021   right upper back paraspinal   History of SCC (squamous cell carcinoma) of skin 09/10/2019   left dorsum wrist ED&C done 10/28/19   History of SCC (squamous cell carcinoma) of skin 03/04/2020   right dorsum hand   History of squamous cell carcinoma in situ (SCCIS) 03/04/2020   left dorsum wrist  ED&C   History of squamous cell carcinoma in situ (SCCIS) 03/04/2020   right medial infraorbital  ED&C 04/28/2020   History of squamous cell carcinoma in situ (SCCIS) 10/28/2019   right dorsum hand proximal lateral   History of squamous cell carcinoma in situ (SCCIS) 10/28/2019   right dorsum hand proximal medial   Hyperlipidemia    Hypertension    NSTEMI (non-ST elevated myocardial infarction) (HCC) 04/12/2023   Renal disorder    Squamous cell carcinoma in situ 10/31/2021   left distal  tricep,  EDC   Squamous cell carcinoma of skin 08/28/2022   Right volar forearm - EDC   Assessment: Patient is a 77 year old female with a past medical history of hypertension, recent NSTEMI with left heart cath on 04/13/2023 complicated by iliac artery laceration with retroperitoneal hematoma requiring exploration and repair by vascular surgery who was discharged home 2 days ago. Now presents to hospital with redness, swelling, and increased pain at her right groin incision. Team wants to transition patient from her apixaban to IV heparin for possible surgical debridement. Last dose of apixaban unknown but at minimum prior to 1500 when she was admitted. Baseline INR 1.5 and aPTT 43. No signs/symptoms of bleeding noted.  12/4 @ 0453:  aPTT = 75,  HL = 0.30 correlating 12/4-  pt had procedure 12/4 1800- heparin drip to resume at previous rate per consult (from vascular)  Goal of Therapy:  Heparin level 0.3-0.7 units/ml aPTT 66-102 seconds Monitor platelets by anticoagulation protocol: Yes   Plan:   per Heparin consult from vascular:   please restart heparin at the previous rate at 6 pm no bolus  OK to follow nomogram thereafter (will be returning to the OR on Friday)  - will order HL for 8  hrs post resumption of drip  Monitor CBC daily  Sabrine Patchen A, PharmD 04/25/2023 6:14 PM

## 2023-04-25 NOTE — Anesthesia Preprocedure Evaluation (Addendum)
Anesthesia Evaluation  Patient identified by MRN, date of birth, ID band Patient awake    Reviewed: Allergy & Precautions, NPO status , Patient's Chart, lab work & pertinent test results  History of Anesthesia Complications Negative for: history of anesthetic complications  Airway Mallampati: IV   Neck ROM: Full    Dental no notable dental hx.    Pulmonary former smoker (quit 1990) Pleural effusion s/p thoracentesis yielding 1.6 L fluid 04/24/23   Pulmonary exam normal breath sounds clear to auscultation       Cardiovascular hypertension, + CAD (s/p MI 04/12/23 and stents on Plavix), + Peripheral Vascular Disease (s/p CEA) and +CHF (EF 25-30%)  Normal cardiovascular exam+ dysrhythmias (a fib on Eliquis)  Rhythm:Regular Rate:Normal  ECG 04/22/23: a flutter; T abnormality; prolonged QT; no STEMI   Neuro/Psych Chronic pain    GI/Hepatic ,GERD  ,,  Endo/Other  negative endocrine ROS    Renal/GU Renal disease (stage III CKD)     Musculoskeletal   Abdominal   Peds  Hematology negative hematology ROS (+)   Anesthesia Other Findings   Reproductive/Obstetrics                             Anesthesia Physical Anesthesia Plan  ASA: 4  Anesthesia Plan: General   Post-op Pain Management:    Induction: Intravenous  PONV Risk Score and Plan: 3 and Ondansetron, Dexamethasone and Treatment may vary due to age or medical condition  Airway Management Planned: LMA  Additional Equipment:   Intra-op Plan:   Post-operative Plan: Extubation in OR  Informed Consent: I have reviewed the patients History and Physical, chart, labs and discussed the procedure including the risks, benefits and alternatives for the proposed anesthesia with the patient or authorized representative who has indicated his/her understanding and acceptance.     Dental advisory given  Plan Discussed with: CRNA  Anesthesia  Plan Comments: (Patient consented for risks of anesthesia including but not limited to:  - adverse reactions to medications - damage to eyes, teeth, lips or other oral mucosa - nerve damage due to positioning  - sore throat or hoarseness - damage to heart, brain, nerves, lungs, other parts of body or loss of life  Informed patient about role of CRNA in peri- and intra-operative care.  Patient voiced understanding.)        Anesthesia Quick Evaluation

## 2023-04-25 NOTE — Op Note (Signed)
    OPERATIVE NOTE   PROCEDURE: 1.  Excisional debridement of right groin wound status post repair of pseudoaneurysm. 2.  Application of a VAC wound dressing (nondisposable)  PRE-OPERATIVE DIAGNOSIS:  Right groin wound dehiscence. Preperitoneal/retroperitoneal hematoma status postcardiac cath  POST-OPERATIVE DIAGNOSIS: Same  SURGEON: [name]  ASSISTANT(S): Rolla Plate, NP  ANESTHESIA: general  ESTIMATED BLOOD LOSS: 20 cc  FINDING(S): Hematoma in the subfascial space no evidence of purulence. Necrotic skin soft tissue and fascia in the right groin no evidence of purulence  SPECIMEN(S): Evacuated hematoma to pathology for permanent section also deep wound culture of the hematoma space sent to microbiology  INDICATIONS:   Dawn Bruce is a 77 y.o. female who presents with dehiscence of the right groin wound associated with necrosis of the skin and soft tissues.  This is secondary to a right groin pseudoaneurysm sustained secondary to cardiac catheterization.  CT scan is also demonstrated a retroperitoneal hematoma.  There is concern for infection.  Given these findings on physical examination as well as radiographic studies debridement in the operating room has been recommended with the plan for application of a VAC.  Risks and benefits of been reviewed all questions answered patient agrees to proceed  DESCRIPTION: After full informed written consent was obtained from the patient, the patient was brought back to the operating room and placed supine upon the operating table.  Prior to induction, the patient received IV antibiotics.   After obtaining adequate anesthesia, the patient was then prepped and draped in the standard fashion for a excisional debridement of the right groin wound.  Initially the remaining staples are removed.  Vicryl suture material from the subcutaneous layer is debrided.  Debridement is carried out with Metzenbaum scissors.  A large amount of skin and  subcutaneous tissues is debrided as well as tendon and fascia from the abdominal wall.  Once the majority of the necrotic appearing tissue had been debulked I used a curette to treat the entire wound as a second time.  The wound was then irrigated with several 100 cc of saline and hemostasis achieved with the Bovie cautery.  The femoral sheath remains intact over the vessels and appears sound.  At this point having not encountered any undrained pockets of purulence that would explain her persistent acidosis in association with the concerns for infection I elected to split the fascia of the abdominal wall and entered the hematoma cavity.  I evacuated a moderate amount of thrombus.  I then cultured the deep hematoma cavity.  The evacuated thrombus was sent for specimen.  Again no frank pus was encountered.  The hematoma cavity was then irrigated copiously with several 100 cc of saline.  The wound was then inspected for hemostasis.  Measurements were then taken of the wound which was 22 cm in length by 10 cm in width and 3 to 4 cm in depth.  Surface area is 220 cm.  A medium VAC sponge was then delivered onto the field and trimmed to an appropriate shape and a VAC dressing was applied with good seal in the OR.   The patient tolerated this procedure well.   COMPLICATIONS: None  CONDITION: [facility]  Office: [phone]   04/25/2023, 3:08 PM

## 2023-04-25 NOTE — Transfer of Care (Signed)
Immediate Anesthesia Transfer of Care Note  Patient: Dawn Bruce  Procedure(s) Performed: IRRIGATION AND DEBRIDEMENT WOUND (Right) APPLICATION OF WOUND VAC (Right)  Patient Location: PACU  Anesthesia Type:General  Level of Consciousness: drowsy and patient cooperative  Airway & Oxygen Therapy: Patient Spontanous Breathing and Patient connected to face mask oxygen  Post-op Assessment: Report given to RN and Post -op Vital signs reviewed and stable  Post vital signs: stable Bedside RN given detailed report about times that phenyl gtt stopped, legs elevated over head to also compensate for hypotension. Cuff on right forearm. Oxygen 4L continues via face mask.  Last Vitals:  Vitals Value Taken Time  BP 106/47 04/25/23 1502  Temp    Pulse 66 04/25/23 1507  Resp 19 04/25/23 1508  SpO2 100 % 04/25/23 1507  Vitals shown include unfiled device data.  Last Pain:  Vitals:   04/25/23 1222  TempSrc: Temporal  PainSc: 0-No pain      Patients Stated Pain Goal: 0 (04/25/23 1222)  Complications: No notable events documented.

## 2023-04-25 NOTE — Plan of Care (Signed)

## 2023-04-25 NOTE — Anesthesia Postprocedure Evaluation (Signed)
Anesthesia Post Note  Patient: Dawn Bruce  Procedure(s) Performed: IRRIGATION AND DEBRIDEMENT WOUND (Right) APPLICATION OF WOUND VAC (Right)  Patient location during evaluation: PACU Anesthesia Type: General Level of consciousness: awake and alert, oriented and patient cooperative Pain management: pain level controlled Vital Signs Assessment: post-procedure vital signs reviewed and stable Respiratory status: spontaneous breathing, nonlabored ventilation and respiratory function stable Cardiovascular status: blood pressure returned to baseline and stable Postop Assessment: adequate PO intake Anesthetic complications: no   There were no known notable events for this encounter.   Last Vitals:  Vitals:   04/25/23 1505 04/25/23 1515  BP: (!) 106/47 (!) 108/49  Pulse: 66 70  Resp: 18 18  Temp: (!) 36.1 C   SpO2: 100% 100%    Last Pain:  Vitals:   04/25/23 1530  TempSrc:   PainSc: Asleep                 Reed Breech

## 2023-04-26 ENCOUNTER — Encounter: Payer: Self-pay | Admitting: Vascular Surgery

## 2023-04-26 DIAGNOSIS — L089 Local infection of the skin and subcutaneous tissue, unspecified: Secondary | ICD-10-CM | POA: Diagnosis not present

## 2023-04-26 DIAGNOSIS — I482 Chronic atrial fibrillation, unspecified: Secondary | ICD-10-CM | POA: Diagnosis not present

## 2023-04-26 DIAGNOSIS — T8130XA Disruption of wound, unspecified, initial encounter: Secondary | ICD-10-CM | POA: Diagnosis not present

## 2023-04-26 DIAGNOSIS — K661 Hemoperitoneum: Secondary | ICD-10-CM | POA: Diagnosis not present

## 2023-04-26 DIAGNOSIS — L03314 Cellulitis of groin: Secondary | ICD-10-CM | POA: Diagnosis not present

## 2023-04-26 DIAGNOSIS — I509 Heart failure, unspecified: Secondary | ICD-10-CM | POA: Diagnosis not present

## 2023-04-26 DIAGNOSIS — I2510 Atherosclerotic heart disease of native coronary artery without angina pectoris: Secondary | ICD-10-CM | POA: Diagnosis not present

## 2023-04-26 LAB — COMPREHENSIVE METABOLIC PANEL
ALT: 28 U/L (ref 0–44)
AST: 27 U/L (ref 15–41)
Albumin: 2.6 g/dL — ABNORMAL LOW (ref 3.5–5.0)
Alkaline Phosphatase: 44 U/L (ref 38–126)
Anion gap: 6 (ref 5–15)
BUN: 20 mg/dL (ref 8–23)
CO2: 24 mmol/L (ref 22–32)
Calcium: 8 mg/dL — ABNORMAL LOW (ref 8.9–10.3)
Chloride: 106 mmol/L (ref 98–111)
Creatinine, Ser: 1.08 mg/dL — ABNORMAL HIGH (ref 0.44–1.00)
GFR, Estimated: 52 mL/min — ABNORMAL LOW (ref >60)
Glucose, Bld: 111 mg/dL — ABNORMAL HIGH (ref 70–99)
Potassium: 3.4 mmol/L — ABNORMAL LOW (ref 3.5–5.1)
Sodium: 136 mmol/L (ref 135–145)
Total Bilirubin: 0.9 mg/dL (ref <1.2)
Total Protein: 4.8 g/dL — ABNORMAL LOW (ref 6.5–8.1)

## 2023-04-26 LAB — AEROBIC CULTURE W GRAM STAIN (SUPERFICIAL SPECIMEN)

## 2023-04-26 LAB — CBC
HCT: 24.2 % — ABNORMAL LOW (ref 36.0–46.0)
HCT: 29.6 % — ABNORMAL LOW (ref 36.0–46.0)
Hemoglobin: 7.6 g/dL — ABNORMAL LOW (ref 12.0–15.0)
Hemoglobin: 9.5 g/dL — ABNORMAL LOW (ref 12.0–15.0)
MCH: 27.8 pg (ref 26.0–34.0)
MCH: 27.9 pg (ref 26.0–34.0)
MCHC: 31.4 g/dL (ref 30.0–36.0)
MCHC: 32.1 g/dL (ref 30.0–36.0)
MCV: 88.6 fL (ref 80.0–100.0)
MCV: 87.1 fL (ref 80.0–100.0)
Platelets: 263 10*3/uL (ref 150–400)
Platelets: 272 10*3/uL (ref 150–400)
RBC: 2.73 MIL/uL — ABNORMAL LOW (ref 3.87–5.11)
RBC: 3.4 MIL/uL — ABNORMAL LOW (ref 3.87–5.11)
RDW: 16.7 % — ABNORMAL HIGH (ref 11.5–15.5)
RDW: 16 % — ABNORMAL HIGH (ref 11.5–15.5)
WBC: 10.2 10*3/uL (ref 4.0–10.5)
WBC: 11 10*3/uL — ABNORMAL HIGH (ref 4.0–10.5)
nRBC: 0 % (ref 0.0–0.2)
nRBC: 0 % (ref 0.0–0.2)

## 2023-04-26 LAB — HEPARIN LEVEL (UNFRACTIONATED)
Heparin Unfractionated: 0.23 [IU]/mL — ABNORMAL LOW (ref 0.30–0.70)
Heparin Unfractionated: 1.01 [IU]/mL — ABNORMAL HIGH (ref 0.30–0.70)

## 2023-04-26 LAB — APTT: aPTT: >200 s (ref 24–36)

## 2023-04-26 LAB — SURGICAL PATHOLOGY

## 2023-04-26 LAB — PREPARE RBC (CROSSMATCH)

## 2023-04-26 LAB — PREALBUMIN: Prealbumin: 8 mg/dL — ABNORMAL LOW (ref 18–38)

## 2023-04-26 MED ORDER — HEPARIN BOLUS VIA INFUSION
800.0000 [IU] | Freq: Once | INTRAVENOUS | Status: AC
  Administered 2023-04-26: 800 [IU] via INTRAVENOUS
  Filled 2023-04-26: qty 800

## 2023-04-26 MED ORDER — CLOPIDOGREL BISULFATE 75 MG PO TABS
75.0000 mg | ORAL_TABLET | Freq: Every day | ORAL | Status: DC
  Administered 2023-04-26 – 2023-05-13 (×18): 75 mg via ORAL
  Filled 2023-04-26 (×18): qty 1

## 2023-04-26 MED ORDER — CHLORHEXIDINE GLUCONATE 4 % EX SOLN
60.0000 mL | Freq: Once | CUTANEOUS | Status: AC
  Administered 2023-04-27: 4 via TOPICAL

## 2023-04-26 MED ORDER — SODIUM CHLORIDE 0.9% IV SOLUTION: Freq: Once | INTRAVENOUS | Status: AC

## 2023-04-26 MED ORDER — APIXABAN 2.5 MG PO TABS
2.5000 mg | ORAL_TABLET | Freq: Two times a day (BID) | ORAL | Status: DC
Start: 2023-04-26 — End: 2023-05-13
  Administered 2023-04-26 – 2023-05-13 (×34): 2.5 mg via ORAL
  Filled 2023-04-26 (×34): qty 1

## 2023-04-26 MED ORDER — SODIUM CHLORIDE 0.9 % IV SOLN: INTRAVENOUS | Status: DC

## 2023-04-26 NOTE — Progress Notes (Signed)
Progress Note    04/26/2023 11:56 AM 1 Day Post-Op  Subjective:   Dawn Bruce is a 77 yo female who presents to [facility] emergency room with post operative right groin infection.  Patient is now postop day 1 from right groin incision and drainage with wound washout and wound VAC placement.  Patient is resting comfortably in bed this afternoon.  Wound VAC is in place and working well.  Noted serosanguineous drainage to canister and the about 50 mL.  Patient does endorse some pain and soreness to her right groin but states it is manageable.  No other complaints overnight vitals are remained stable.   Vitals:   04/26/23 0345 04/26/23 0834  BP: (!) 113/44 (!) 110/39  Pulse: 72 76  Resp:  19  Temp:  98.1 F (36.7 C)  SpO2:  98%   Physical Exam: Cardiac:  Hx of Atrial Fibrillation. Regular Rate  Lungs:  Left Lung clear on auscultation. Right lung diminished breath sounds 1/2 way. Mild Dyspnea with couch. No wheezing noted.  Incisions:  Right groin with wound VAC in place and working well.  Maintain suction and noted 50 mL serosanguineous drainage overnight. Extremities:  Bilateral lower extremities warm to touch. Palpable pulses. Noted +1 edema to right lower extremity.  Abdomen:  Positive bowel sounds throughout, soft and non tender and non distended.  Neurologic: AAOX4, Follows commands.  CBC    Component Value Date/Time   WBC 10.2 04/26/2023 0216   RBC 2.73 (L) 04/26/2023 0216   HGB 7.6 (L) 04/26/2023 0216   HGB 13.6 06/01/2022 0000   HCT 24.2 (L) 04/26/2023 0216   HCT 40.5 06/01/2022 0000   PLT 263 04/26/2023 0216   PLT 132 (L) 06/01/2022 0000   MCV 88.6 04/26/2023 0216   MCV 90 06/01/2022 0000   MCH 27.8 04/26/2023 0216   MCHC 31.4 04/26/2023 0216   RDW 16.7 (H) 04/26/2023 0216   RDW 12.8 06/01/2022 0000   LYMPHSABS 0.7 04/22/2023 1528   LYMPHSABS 0.7 08/30/2021 0917   MONOABS 1.2 (H) 04/22/2023 1528   EOSABS 0.2 04/22/2023 1528   EOSABS 0.0 08/30/2021 0917   BASOSABS  0.0 04/22/2023 1528   BASOSABS 0.0 08/30/2021 0917    BMET    Component Value Date/Time   NA 136 04/26/2023 0216   NA 147 (H) 06/01/2022 0000   K 3.4 (L) 04/26/2023 0216   CL 106 04/26/2023 0216   CO2 24 04/26/2023 0216   GLUCOSE 111 (H) 04/26/2023 0216   BUN 20 04/26/2023 0216   BUN 23 06/01/2022 0000   CREATININE 1.08 (H) 04/26/2023 0216   CALCIUM 8.0 (L) 04/26/2023 0216   GFRNONAA 52 (L) 04/26/2023 0216   GFRAA 54 (L) 10/08/2019 1419    INR    Component Value Date/Time   INR 1.5 (H) 04/22/2023 1540     Intake/Output Summary (Last 24 hours) at 04/26/2023 1156 Last data filed at 04/26/2023 0600 Gross per 24 hour  Intake 684.64 ml  Output 750 ml  Net -65.36 ml     Assessment/Plan:  77 y.o. female is s/p incision and drainage of right groin wound with washout and wound VAC placement.  1 Day Post-Op   PLAN: Vascular surgery plans on taking the patient back to the operating room tomorrow on 04/27/2023 for wound washout and wound VAC exchange.  I discussed procedure with the patient and family at the bedside in detail today.  We discussed the procedure, benefits, risk, and complications.  All would like to proceed.  I answered all her questions today.  Patient will be made n.p.o. after midnight for procedure tomorrow.  I discussed the plan in detail with Dr. [name] MD and he agrees with the plan.  DVT prophylaxis: Heparin infusion   [name] [facility] 04/26/2023 11:56 AM

## 2023-04-26 NOTE — Progress Notes (Signed)
PHARMACY - ANTICOAGULATION CONSULT NOTE  Pharmacy Consult for Heparin Infusion Indication: atrial fibrillation  No Known Allergies  Patient Measurements: Height: 5' 2.99" (160 cm) Weight: 66 kg (145 lb 9.6 oz) IBW/kg (Calculated) : 52.38 Heparin Dosing Weight: 54.4 kg  Vital Signs: Temp: 97.4 F (36.3 C) (12/05 0016) Temp Source: Oral (12/05 0016) BP: 111/48 (12/05 0016) Pulse Rate: 71 (12/05 0016)  Labs: Recent Labs    04/23/23 0631 04/23/23 1430 04/24/23 0202 04/24/23 1132 04/24/23 2136 04/25/23 0453 04/26/23 0216  HGB 9.4*  --  9.4*  --   --  8.8* 7.6*  HCT 29.1*  --  28.5*  --   --  27.0* 24.2*  PLT 320  --  351  --   --  323 263  APTT 85*   < > 122* 73* 73* 75*  --   HEPARINUNFRC >1.10*  --  0.80*  --   --  0.30 0.23*  CREATININE 1.02*  --  1.14*  --   --   --  1.08*   < > = values in this interval not displayed.   Estimated Creatinine Clearance: 38.5 mL/min (A) (by C-G formula based on SCr of 1.08 mg/dL (H)).  Medical History: Past Medical History:  Diagnosis Date   Actinic keratosis    Chronic pain    GERD (gastroesophageal reflux disease) ?   Taking Pepcid   History of basal cell carcinoma (BCC) 05/02/2021   right upper back paraspinal   History of SCC (squamous cell carcinoma) of skin 09/10/2019   left dorsum wrist ED&C done 10/28/19   History of SCC (squamous cell carcinoma) of skin 03/04/2020   right dorsum hand   History of squamous cell carcinoma in situ (SCCIS) 03/04/2020   left dorsum wrist  ED&C   History of squamous cell carcinoma in situ (SCCIS) 03/04/2020   right medial infraorbital  ED&C 04/28/2020   History of squamous cell carcinoma in situ (SCCIS) 10/28/2019   right dorsum hand proximal lateral   History of squamous cell carcinoma in situ (SCCIS) 10/28/2019   right dorsum hand proximal medial   Hyperlipidemia    Hypertension    NSTEMI (non-ST elevated myocardial infarction) (HCC) 04/12/2023   Renal disorder    Squamous cell  carcinoma in situ 10/31/2021   left distal  tricep, EDC   Squamous cell carcinoma of skin 08/28/2022   Right volar forearm - EDC   Assessment: Patient is a 77 year old female with a past medical history of hypertension, recent NSTEMI with left heart cath on 04/13/2023 complicated by iliac artery laceration with retroperitoneal hematoma requiring exploration and repair by vascular surgery who was discharged home 2 days ago. Now presents to hospital with redness, swelling, and increased pain at her right groin incision. Team wants to transition patient from her apixaban to IV heparin for possible surgical debridement. Last dose of apixaban unknown but at minimum prior to 1500 when she was admitted. Baseline INR 1.5 and aPTT 43. No signs/symptoms of bleeding noted.  12/4 @ 0453:  aPTT = 75,  HL = 0.30 correlating 12/4-  pt had procedure 12/4 1800- heparin drip to resume at previous rate per consult (from vascular) 12/5  0216:  HL = 0.23, SUBtherapeutic   Goal of Therapy:  Heparin level 0.3-0.7 units/ml aPTT 66-102 seconds Monitor platelets by anticoagulation protocol: Yes   Plan:  12/5:  HL @ 0216 = 0.23, SUBtherapeutic  - Will order heparin 800 units IV X 1 bolus and increase drip  rate to 900 units/hr - Will recheck HL 8 hrs after rate change  Monitor CBC daily  Jacky Hartung D, PharmD 04/26/2023 3:02 AM

## 2023-04-26 NOTE — Plan of Care (Signed)

## 2023-04-26 NOTE — Progress Notes (Signed)
PULMONOLOGY         Date: 04/26/2023,   MRN# [id number] Kaylonni Mank 1945/12/24     AdmissionWeight: 54.4 kg                 CurrentWeight: 66 kg  Referring provider: Dr Gilda Crease   CHIEF COMPLAINT:   Complete atelectasis of left lung with bilateral pleural effusions.    HISTORY OF PRESENT ILLNESS   77 year old female with a history of chronic GERD, basal cell carcinoma squamous cell carcinoma history of NSTEMI diastolic CHF, CAD status post recent PCI with DES, chronic A-fib on Eliquis, essential hypertension, dyslipidemia CKD 3A chronic pain syndrome remote perforated appendicitis, hospitalized November 2024 with non-STEMI.  Post left heart cath had developed retroperitoneal hematoma and hemorrhagic shock with complications of iliac artery laceration.  Status post surgical repair via vascular surgery service.  Had outpatient therapy with antibiotics with failure came in with transient hypotension and circulatory shock.  Found to have elevated cardiac biomarkers and acute on chronic CHF.  Vascular surgery was reconsulted today and PCCM consultation was placed to incidental finding of complete atelectasis of the left lung with surrounding bilateral pleural effusions worse on the left.  04/25/23- patient evaluated in PACU post op , appears to be mildy sedated still. Mildy hypotensive at this moment.  Lung sounds much improved post 1.6L left pleural space aspiration.  04/26/23- patient seen and examined , [name] (daughter) at bedside.  Patient receiving blood transfusion.  Lung sounds are clear , vitals are stable.  Hypoalbulminemia continues. Will order urine protein and pre albumin levels,  because patient shares she's eating well.  CXR in am to check interval effusion.     PAST MEDICAL HISTORY   Past Medical History:  Diagnosis Date   Actinic keratosis    Chronic pain    GERD (gastroesophageal reflux disease) ?   Taking Pepcid   History of basal cell carcinoma (BCC)  05/02/2021   right upper back paraspinal   History of SCC (squamous cell carcinoma) of skin 09/10/2019   left dorsum wrist ED&C done 10/28/19   History of SCC (squamous cell carcinoma) of skin 03/04/2020   right dorsum hand   History of squamous cell carcinoma in situ (SCCIS) 03/04/2020   left dorsum wrist  ED&C   History of squamous cell carcinoma in situ (SCCIS) 03/04/2020   right medial infraorbital  ED&C 04/28/2020   History of squamous cell carcinoma in situ (SCCIS) 10/28/2019   right dorsum hand proximal lateral   History of squamous cell carcinoma in situ (SCCIS) 10/28/2019   right dorsum hand proximal medial   Hyperlipidemia    Hypertension    NSTEMI (non-ST elevated myocardial infarction) (HCC) 04/12/2023   Renal disorder    Squamous cell carcinoma in situ 10/31/2021   left distal  tricep, EDC   Squamous cell carcinoma of skin 08/28/2022   Right volar forearm - EDC     SURGICAL HISTORY   Past Surgical History:  Procedure Laterality Date   ABDOMINAL HYSTERECTOMY     APPENDECTOMY  April 2023   Burst appendix   AUGMENTATION MAMMAPLASTY Bilateral    25-30 years ago   CARDIOVERSION N/A 12/01/2020   Procedure: CARDIOVERSION;  Surgeon: Debbe Odea, MD;  Location: [facility] ORS;  Service: Cardiovascular;  Laterality: N/A;   CAROTID ARTERY ANGIOPLASTY Right 1995(approximate)   CAROTID ENDARTERECTOMY  2001   CORONARY STENT INTERVENTION N/A 04/13/2023   Procedure: CORONARY STENT INTERVENTION;  Surgeon: Marcina Millard, MD;  Location: [facility] INV[facility]IVE [facility];  Service: Cardiovascular;  Laterality: N/A;   COSMETIC SURGERY     ELBOW SURGERY Left    ENDARTERECTOMY FEMORAL Right 04/13/2023   Procedure: Right groin exploration with repair of right external iliac artery and right circumflex artery;  Surgeon: Bertram Denver, MD;  Location: [facility] ORS;  Service: Vascular;  Laterality: Right;   FRACTURE SURGERY  2016   Arm   LEFT HEART CATH AND CORONARY ANGIOGRAPHY N/A 04/13/2023    Procedure: LEFT HEART CATH AND CORONARY ANGIOGRAPHY;  Surgeon: Marcina Millard, MD;  Location: [facility] INVAS[facility] CV [facility];  Service: Cardiovascular;  Laterality: N/A;   THORACENTESIS N/A 12/27/2020   Procedure: Alanson Puls;  Surgeon: Josephine Igo, DO;  Location: [facility];  Service: Pulmonary;  Laterality: N/A;   TONSILLECTOMY     TUBAL LIGATION  ?     FAMILY HISTORY   Family History  Problem Relation Age of Onset   Cancer Mother    Cancer Father    Cancer Sister    Breast cancer Neg Hx      SOCIAL HISTORY   Social History   Tobacco Use   Smoking status: Former    Current packs/day: 0.00    Average packs/day: 0.3 packs/day for 15.0 years (3.8 ttl pk-yrs)    Types: Cigarettes    Start date: 8    Quit date: 58    Years since quitting: 34.9    Passive exposure: Past   Smokeless tobacco: Never   Tobacco comments:    quit around 1990-1995 (?)  Vaping Use   Vaping status: Never Used  Substance Use Topics   Alcohol use: Yes    Alcohol/week: 14.0 standard drinks of alcohol    Types: 14 Standard drinks or equivalent per week    Comment: 2 scotch shots   Drug use: Never     MEDICATIONS    Home Medication:     Current Medication:  Current Facility-Administered Medications:    0.9 %  sodium chloride infusion (Manually program via Guardrails IV Fluids), , Intravenous, Once, Leeroy Bock, MD   acetaminophen (TYLENOL) tablet 650 mg, 650 mg, Oral, Q6H PRN, Schnier, Latina Craver, MD, 650 mg at 04/26/23 0640   albuterol (PROVENTIL) (2.5 MG/3ML) 0.083% nebulizer solution 2.5 mg, 2.5 mg, Nebulization, Q4H PRN, Schnier, Latina Craver, MD   alum & mag hydroxide-simeth (MAALOX/MYLANTA) 200-200-20 MG/5ML suspension 15 mL, 15 mL, Oral, Once, Schnier, Latina Craver, MD   aspirin EC tablet 81 mg, 81 mg, Oral, Daily, Schnier, Latina Craver, MD, 81 mg at 04/25/23 0924   atorvastatin (LIPITOR) tablet 80 mg, 80 mg, Oral, Daily, Schnier, Latina Craver, MD, 80 mg at 04/25/23 1610    carvedilol (COREG) tablet 12.5 mg, 12.5 mg, Oral, BID, Schnier, Latina Craver, MD, 12.5 mg at 04/25/23 2049   ceFEPIme (MAXIPIME) 2 g in sodium chloride 0.9 % 100 mL IVPB, 2 g, Intravenous, Q12H, Schnier, Latina Craver, MD, Last Rate: 200 mL/hr at 04/26/23 0700, 2 g at 04/26/23 0700   Chlorhexidine Gluconate Cloth 2 % PADS 6 each, 6 each, Topical, Daily, Schnier, Latina Craver, MD, 6 each at 04/25/23 9604   ezetimibe (ZETIA) tablet 10 mg, 10 mg, Oral, Daily, Schnier, Latina Craver, MD, 10 mg at 04/25/23 0923   fentaNYL (DURAGESIC) 75 MCG/HR 1 patch, 1 patch, Transdermal, Q72H, Schnier, Latina Craver, MD, 1 patch at 04/23/23 0951   heparin ADULT infusion 100 units/mL (25000 units/267mL), 900 Units/hr, Intravenous, Continuous, Leeroy Bock, MD, Last Rate: 9 mL/hr at  04/26/23 0440, 900 Units/hr at 04/26/23 0440   ibuprofen (ADVIL) tablet 200 mg, 200 mg, Oral, Q6H PRN, Schnier, Latina Craver, MD, 200 mg at 04/22/23 2254   linezolid (ZYVOX) IVPB 600 mg, 600 mg, Intravenous, Q12H, Schnier, Latina Craver, MD, Last Rate: 300 mL/hr at 04/25/23 2157, 600 mg at 04/25/23 2157   loperamide (IMODIUM) capsule 2 mg, 2 mg, Oral, BID PRN, Schnier, Latina Craver, MD   oxyCODONE (Oxy IR/ROXICODONE) immediate release tablet 5 mg, 5 mg, Oral, Q6H PRN, Schnier, Latina Craver, MD, 5 mg at 04/26/23 0209   pantoprazole (PROTONIX) EC tablet 40 mg, 40 mg, Oral, Daily, Schnier, Latina Craver, MD, 40 mg at 04/25/23 1610   phenylephrine ((USE for PREPARATION-H)) 0.25 % suppository 1 suppository, 1 suppository, Rectal, PRN, Schnier, Latina Craver, MD   pregabalin (LYRICA) capsule 150 mg, 150 mg, Oral, BID, Schnier, Latina Craver, MD, 150 mg at 04/25/23 2049   sodium chloride flush (NS) 0.9 % injection 10-40 mL, 10-40 mL, Intracatheter, Q12H, Schnier, Latina Craver, MD, 10 mL at 04/25/23 2058   sodium chloride flush (NS) 0.9 % injection 10-40 mL, 10-40 mL, Intracatheter, PRN, Schnier, Latina Craver, MD   traZODone (DESYREL) tablet 25-50 mg, 25-50 mg, Oral, QHS PRN, Gilda Crease, Latina Craver, MD, 50 mg at 04/25/23 2049    ALLERGIES   Patient has no known allergies.     REVIEW OF SYSTEMS    Review of Systems:  Gen:  Denies  fever, sweats, chills weigh loss  HEENT: Denies blurred vision, double vision, ear pain, eye pain, hearing loss, nose bleeds, sore throat Cardiac:  No dizziness, chest pain or heaviness, chest tightness,edema Resp:   reports dyspnea chronically  Gi: Denies swallowing difficulty, stomach pain, nausea or vomiting, diarrhea, constipation, bowel incontinence Gu:  Denies bladder incontinence, burning urine Ext:   Denies Joint pain, stiffness or swelling Skin: Denies  skin rash, easy bruising or bleeding or hives Endoc:  Denies polyuria, polydipsia , polyphagia or weight change Psych:   Denies depression, insomnia or hallucinations   Other:  All other systems negative   VS: BP (!) 113/44   Pulse 72   Temp (!) 97.4 F (36.3 C) (Oral)   Resp 16   Ht 5' 2.99" (1.6 m)   Wt 66 kg   SpO2 96%   BMI 25.80 kg/m      PHYSICAL EXAM    GENERAL:NAD, no fevers, chills, no weakness no fatigue HEAD: Normocephalic, atraumatic.  EYES: Pupils equal, round, reactive to light. Extraocular muscles intact. No scleral icterus.  MOUTH: Moist mucosal membrane. Dentition intact. No abscess noted.  EAR, NOSE, THROAT: Clear without exudates. No external lesions.  NECK: Supple. No thyromegaly. No nodules. No JVD.  PULMONARY: decreased breath sounds with mild rhonchi worse at bases bilaterally.  CARDIOVASCULAR: S1 and S2. Regular rate and rhythm. No murmurs, rubs, or gallops. No edema. Pedal pulses 2+ bilaterally.  GASTROINTESTINAL: Soft, nontender, nondistended. No masses. Positive bowel sounds. No hepatosplenomegaly.  MUSCULOSKELETAL: No swelling, clubbing, or edema. Range of motion full in all extremities.  NEUROLOGIC: Cranial nerves II through XII are intact. No gross focal neurological deficits. Sensation intact. Reflexes intact.  SKIN: No ulceration,  lesions, rashes, or cyanosis. Skin warm and dry. Turgor intact.  PSYCHIATRIC: Mood, affect within normal limits. The patient is awake, alert and oriented x 3. Insight, judgment intact.       IMAGING     ASSESSMENT/PLAN   Complete atelectasis of left lung   - patient s/p diuresis with partial improvement  however still has minimal lung sounds on left.   - she does have CKD 3 and may not produce adequate fluid removal via medication alone   - we discussed options for thoracentesis and patient would like to proceed with this procedure.   - IR consultation placed for therapeutic tap-amber colored left pleural fluid 1.6L -incentive spirometry at bedside to be used multiple times daily  - PT/OT - etiology is likely poor nutrition post ICU hospitalization with CHF and CKD all contributing to third spaced interstitial edema , peripheral edema and effusion      Bilateral pleural effusions    - s/p throacentesis Sept 2024   - consultation for IR with US guided throacentesis - therapeutic    -patient on heparin drip -prealbumin -urine protein  Sepsis due to right groin cellulitis   S/p vascular surgery eval S/p vanco /rocephin  - patient appears to be clinically improved.  - s/p ID revaluation - appreciate input    Thank you for allowing me to participate in the care of this patient.   Patient/Family are satisfied with care plan and all questions have been answered.    Provider disclosure: Patient with at least one acute or chronic illness or injury that poses a threat to life or bodily function and is being managed actively during this encounter.  All of the below services have been performed independently by signing provider:  review of prior documentation from internal and or external health records.  Review of previous and current lab results.  Interview and comprehensive assessment during patient visit today. Review of current and previous chest radiographs/CT scans. Discussion  of management and test interpretation with health care team and patient/family.   This document was prepared using Dragon voice recognition software and may include unintentional dictation errors.     Vida Rigger, M.D.  Division of Pulmonary & Critical Care Medicine

## 2023-04-26 NOTE — Care Management Important Message (Signed)
Important Message  Patient Details  Name: Dawn Bruce MRN: [id number] Date of Birth: 01/04/1946   Important Message Given:  Yes - Medicare IM     Verita Schneiders [name] 04/26/2023, 2:27 PM

## 2023-04-26 NOTE — H&P (View-Only) (Signed)
 Progress Note    04/26/2023 11:56 AM 1 Day Post-Op  Subjective:   Dawn Bruce is a 77 yo female who presents to [facility] emergency room with post operative right groin infection.  Patient is now postop day 1 from right groin incision and drainage with wound washout and wound VAC placement.  Patient is resting comfortably in bed this afternoon.  Wound VAC is in place and working well.  Noted serosanguineous drainage to canister and the about 50 mL.  Patient does endorse some pain and soreness to her right groin but states it is manageable.  No other complaints overnight vitals are remained stable.   Vitals:   04/26/23 0345 04/26/23 0834  BP: (!) 113/44 (!) 110/39  Pulse: 72 76  Resp:  19  Temp:  98.1 F (36.7 C)  SpO2:  98%   Physical Exam: Cardiac:  Hx of Atrial Fibrillation. Regular Rate  Lungs:  Left Lung clear on auscultation. Right lung diminished breath sounds 1/2 way. Mild Dyspnea with couch. No wheezing noted.  Incisions:  Right groin with wound VAC in place and working well.  Maintain suction and noted 50 mL serosanguineous drainage overnight. Extremities:  Bilateral lower extremities warm to touch. Palpable pulses. Noted +1 edema to right lower extremity.  Abdomen:  Positive bowel sounds throughout, soft and non tender and non distended.  Neurologic: AAOX4, Follows commands.  CBC    Component Value Date/Time   WBC 10.2 04/26/2023 0216   RBC 2.73 (L) 04/26/2023 0216   HGB 7.6 (L) 04/26/2023 0216   HGB 13.6 06/01/2022 0000   HCT 24.2 (L) 04/26/2023 0216   HCT 40.5 06/01/2022 0000   PLT 263 04/26/2023 0216   PLT 132 (L) 06/01/2022 0000   MCV 88.6 04/26/2023 0216   MCV 90 06/01/2022 0000   MCH 27.8 04/26/2023 0216   MCHC 31.4 04/26/2023 0216   RDW 16.7 (H) 04/26/2023 0216   RDW 12.8 06/01/2022 0000   LYMPHSABS 0.7 04/22/2023 1528   LYMPHSABS 0.7 08/30/2021 0917   MONOABS 1.2 (H) 04/22/2023 1528   EOSABS 0.2 04/22/2023 1528   EOSABS 0.0 08/30/2021 0917   BASOSABS  0.0 04/22/2023 1528   BASOSABS 0.0 08/30/2021 0917    BMET    Component Value Date/Time   NA 136 04/26/2023 0216   NA 147 (H) 06/01/2022 0000   K 3.4 (L) 04/26/2023 0216   CL 106 04/26/2023 0216   CO2 24 04/26/2023 0216   GLUCOSE 111 (H) 04/26/2023 0216   BUN 20 04/26/2023 0216   BUN 23 06/01/2022 0000   CREATININE 1.08 (H) 04/26/2023 0216   CALCIUM 8.0 (L) 04/26/2023 0216   GFRNONAA 52 (L) 04/26/2023 0216   GFRAA 54 (L) 10/08/2019 1419    INR    Component Value Date/Time   INR 1.5 (H) 04/22/2023 1540     Intake/Output Summary (Last 24 hours) at 04/26/2023 1156 Last data filed at 04/26/2023 0600 Gross per 24 hour  Intake 684.64 ml  Output 750 ml  Net -65.36 ml     Assessment/Plan:  77 y.o. female is s/p incision and drainage of right groin wound with washout and wound VAC placement.  1 Day Post-Op   PLAN: Vascular surgery plans on taking the patient back to the operating room tomorrow on 04/27/2023 for wound washout and wound VAC exchange.  I discussed procedure with the patient and family at the bedside in detail today.  We discussed the procedure, benefits, risk, and complications.  All would like to proceed.  I answered all her questions today.  Patient will be made n.p.o. after midnight for procedure tomorrow.  I discussed the plan in detail with Dr. [name] MD and he agrees with the plan.  DVT prophylaxis: Heparin infusion   [name] [facility] 04/26/2023 11:56 AM

## 2023-04-26 NOTE — Progress Notes (Signed)
PT Cancellation Note  Patient Details Name: Dawn Bruce MRN: [id number] DOB: May 09, 1946   Cancelled Treatment:    Reason Eval/Treat Not Completed: Patient not medically ready. Pt receiving blood transfusion upon arrival. Will re-attempt PT session at a later date.  Edna Grover [employer] SPT, LAT, ATC  Karma Ansley [place] 04/26/2023, 3:05 PM

## 2023-04-26 NOTE — Progress Notes (Signed)
Date of Admission:  04/22/2023     ID: Dawn Bruce is a 77 y.o. female  Principal Problem:   Cellulitis of right groin Active Problems:   Chronic pain syndrome   Essential hypertension   HLD (hyperlipidemia)   Chronic diastolic heart failure (HCC)   Sepsis (HCC)   CAD (coronary artery disease)   Chronic kidney disease, stage 3a (HCC)   Peritoneal hematoma   Pleural effusion   Abnormal LFTs   Hypokalemia   Atrial fibrillation, chronic (HCC)   Diarrhea   HFrEF (heart failure with reduced ejection fraction) (HCC)   Severe sepsis (HCC)   Infected wound   Cellulitis of right thigh    Subjective: Had debridement of rt groin yesterday Has wound vac Reexploration tomorrow  Medications:   alum & mag hydroxide-simeth  15 mL Oral Once   apixaban  2.5 mg Oral BID   aspirin EC  81 mg Oral Daily   atorvastatin  80 mg Oral Daily   carvedilol  12.5 mg Oral BID   Chlorhexidine Gluconate Cloth  6 each Topical Daily   clopidogrel  75 mg Oral Daily   ezetimibe  10 mg Oral Daily   fentaNYL  1 patch Transdermal Q72H   pantoprazole  40 mg Oral Daily   pregabalin  150 mg Oral BID   sodium chloride flush  10-40 mL Intracatheter Q12H    Objective: Vital signs in last 24 hours: Patient Vitals for the past 24 hrs:  BP Temp Temp src Pulse Resp SpO2  04/26/23 1504 (!) 110/56 97.8 F (36.6 C) Oral 81 18 99 %  04/26/23 1445 (!) 106/42 97.7 F (36.5 C) Oral 76 18 99 %  04/26/23 1423 102/72 97.7 F (36.5 C) Oral 76 18 98 %  04/26/23 1405 102/71 97.8 F (36.6 C) Oral 81 18 99 %  04/26/23 0834 (!) 110/39 98.1 F (36.7 C) -- 76 19 98 %  04/26/23 0345 (!) 113/44 -- -- 72 -- --  04/26/23 0332 (!) 101/41 (!) 97.4 F (36.3 C) Oral 76 16 96 %  04/26/23 0016 (!) 111/48 (!) 97.4 F (36.3 C) Oral 71 16 96 %  04/25/23 2048 (!) 130/52 97.7 F (36.5 C) Oral 82 18 97 %  04/25/23 1730 (!) 114/45 -- -- 79 13 96 %  04/25/23 1715 (!) 114/45 -- -- 77 12 96 %  04/25/23 1700 (!) 111/45 -- -- 77 14 99  %  04/25/23 1655 -- -- -- 76 15 98 %  04/25/23 1650 -- -- -- 76 16 97 %  04/25/23 1645 (!) 108/39 -- -- 76 18 98 %  04/25/23 1630 (!) 105/40 -- -- 77 16 98 %  04/25/23 1615 (!) 99/36 -- -- 75 18 97 %  04/25/23 1603 (!) 99/32 -- -- 74 18 96 %  04/25/23 1600 (!) 96/42 -- -- 73 18 95 %      PHYSICAL EXAM:  General: Alert, cooperative, no distress, appears stated age.  Lungs: Clear to auscultation bilaterally. No Wheezing or Rhonchi. No rales. Heart: Regular rate and rhythm, no murmur, rub or gallop. Abdomen: Soft, non-tender,not distended. Bowel sounds normal. No masses Rt groin wound vac Extremities: bruising thighs Skin: as above Lymph: Cervical, supraclavicular normal. Neurologic: Grossly non-focal  Lab Results    Latest Ref Rng & Units 04/26/2023    2:16 AM 04/25/2023    4:53 AM 04/24/2023    2:02 AM  CBC  WBC 4.0 - 10.5 K/uL 10.2  12.4  14.5  Hemoglobin 12.0 - 15.0 g/dL 7.6  8.8  9.4   Hematocrit 36.0 - 46.0 % 24.2  27.0  28.5   Platelets 150 - 400 K/uL 263  323  351        Latest Ref Rng & Units 04/26/2023    2:16 AM 04/24/2023    2:02 AM 04/23/2023    6:31 AM  CMP  Glucose 70 - 99 mg/dL 272  536  97   BUN 8 - 23 mg/dL 20  25  23    Creatinine 0.44 - 1.00 mg/dL 6.44  0.34  7.42   Sodium 135 - 145 mmol/L 136  139  141   Potassium 3.5 - 5.1 mmol/L 3.4  3.3  3.5   Chloride 98 - 111 mmol/L 106  105  109   CO2 22 - 32 mmol/L 24  26  22    Calcium 8.9 - 10.3 mg/dL 8.0  7.6  7.9   Total Protein 6.5 - 8.1 g/dL 4.8     Total Bilirubin <1.2 mg/dL 0.9     Alkaline Phos 38 - 126 U/L 44     AST 15 - 41 U/L 27     ALT 0 - 44 U/L 28         Microbiology: Wound culture E.coli, proteus morganella  Studies/Results: DG Chest Port 1 View  Result Date: 04/24/2023 CLINICAL DATA:  Status post left thoracentesis. EXAM: PORTABLE CHEST 1 VIEW COMPARISON:  April 22, 2023. FINDINGS: The heart size and mediastinal contours are within normal limits. Minimal bibasilar subsegmental  atelectasis is noted with small pleural effusions. Right-sided PICC line is noted with distal tip in expected position of the SVC. The visualized skeletal structures are unremarkable. IMPRESSION: Minimal bibasilar subsegmental atelectasis with small pleural effusions. Electronically Signed   By: Lupita Raider M.D.   On: 04/24/2023 16:59   US THORACENTESIS ASP PLEURAL SPACE W/IMG GUIDE  Result Date: 04/24/2023 INDICATION: Congestive heart failure with left pleural effusion. Request for therapeutic thoracentesis. EXAM: ULTRASOUND GUIDED LEFT THORACENTESIS MEDICATIONS: 1% lidocaine 10 mL COMPLICATIONS: None immediate. PROCEDURE: An ultrasound guided thoracentesis was thoroughly discussed with the patient and questions answered. The benefits, risks, alternatives and complications were also discussed. The patient understands and wishes to proceed with the procedure. Written consent was obtained. Ultrasound was performed to localize and mark an adequate pocket of fluid in the left chest. The area was then prepped and draped in the normal sterile fashion. 1% Lidocaine was used for local anesthesia. Under ultrasound guidance a 6 Fr Safe-T-Centesis catheter was introduced. Thoracentesis was performed. The catheter was removed and a dressing applied. FINDINGS: A total of approximately 1.6 L of dark amber fluid was removed. IMPRESSION: Successful ultrasound guided left thoracentesis yielding 1.6 L of pleural fluid. No pneumothorax on post-procedure chest x-ray. Procedure performed by: Corrin Parker, PA-C Electronically Signed   By: Gilmer Mor D.O.   On: 04/24/2023 16:45     Assessment/Plan: Infected groin wound at the site of surgery with dehiscence of wound and surrounding cellulitis Culture polymicrobial infection Currently on linezolid/cefepime  debridement done and wound vac   Hematoma both retroperitoneal and superficial at the site of cardiac cath removed   Recent hemorrhagic shock needing PRBc    Injury to external iliac artery on the right which was repaired   CAD s/p stent ( restenosis of ostial LAD stent)? Afib   Discussed the management with the patient and her daughter

## 2023-04-26 NOTE — Progress Notes (Signed)
PROGRESS NOTE  Dawn Bruce    DOB: [birth year], 77 y.o.  WUJ:[id number]    Code Status: Full Code   DOA: 04/22/2023   LOS: 4   Brief hospital course  Dawn Bruce is a 77 y.o. female with medical history significant of dCHF, CAD (s/p of recent DES), A fib on Eliquis, HTN, HLD, CDK-3a, chronic pain syndrome, multiple skin cancer, perforated appendicitis, former smoker, who presents with right groin pain.   Patient was recently hospitalized from 11/21 - 11/29 due to non-STEMI.  Patient is s/p of LHC with DES placement. Pt developed retroperitoneal hematoma & hemorrhagic shock due to complication of Iliac artery laceration during left heart cath of right groin. Pt underwent surgical repair of external iliac artery by [facility] on 11/23.   Presented back to ED due to increased redness, swelling, and pain at incision site of the cath. Failed outpatient Abx x1 dose.  Hypotensive to 80/50 on presentation.    ED Course: pt was found to have WBC 21.3, BNP 626, stable renal function, potassium 3.3, Mg 2.3, phosphorus 2.7, lactic acid of 1.7, C diff negative. Temperature normal, blood pressure 141/74, heart rate 104, RR 29, oxygen saturation 98% on room air.   Dr. Myra Gianotti of [facility] is consulted by EDP. NFA:[id number] development of moderate size left pleural effusion with probable underlying atelectasis or infiltrate. CT-abd/pelvis: Moderate sized right retroperitoneal hematoma is noted which extends into right pelvic region and displaces urinary bladder to the left. The pelvic component appears to be mildly enlarged compared to prior exam. Bilateral pleural effusions are noted, left greater than right, with associated atelectasis of the lower lobes. Likely from her HF.   EKG:  QTc 532, T wave inversion in inferior leads and V2-V6  They were initially treated with potassium, CTX, heparin gtt, IV fluids, vancomycin, ibuprofen.   Patient was admitted to medicine service for further workup and management of  cellulitis as outlined in detail below.  12/2: pulmonology, and IR consulted to address pleural effusion. ID consulted to address abx course. Transitioned to linezolid. Vascular evaluated and recommended debridement 12/4. Wound care continues. Afebrile.  12/3: vitals stable, afebrile. Wound appears much improved and pain has improved. Debridement planned for tomorrow. Thoracentesis removed 1.6L left side 12/4:stable ORA, afebrile. Wound clinically greatly improved. Debridement today. Plan to stop heparin gtt and return to eliquis + DAPT s/p procedure 04/26/23 -   Assessment & Plan  Principal Problem:   Cellulitis of right groin Active Problems:   Sepsis (HCC)   CAD (coronary artery disease)   Peritoneal hematoma   Essential hypertension   Chronic diastolic heart failure (HCC)   Hypokalemia   Chronic kidney disease, stage 3a (HCC)   Pleural effusion   Abnormal LFTs   Atrial fibrillation, chronic (HCC)   Diarrhea   Chronic pain syndrome   HLD (hyperlipidemia)   HFrEF (heart failure with reduced ejection fraction) (HCC)   Severe sepsis (HCC)   Infected wound   Cellulitis of right thigh  Sepsis due to cellulitis of right groin: sepsis criteria met on admission with WBC 21.3, heart rate 104, RR 29.  Lactic acid normal 1.7.  infection source is superficial groin. Dr. Myra Gianotti of [facility] removed several staples, probed and packed wound of right groin and she was started on IV Abx in ED.  WBC improving, she remains afebrile. Wound showing improvement in drainage and redness today. Wound-vac in place BxCx NGTD. Wound cultures have multiple species. Sensitivities returned. Pending ID update for antibiotic choice - ID  following  - linezolid + cefepime - follow [facility]. Debridement with wound vac placement 12/4 - PT starting today - analgesia PRN  Hypervolemia  Bilateral Pleural effusions- incidental findings- L worse than R. Denies respiratory symptoms. Had recent echo showing HFrEF. Stable ORA.  Of note, had thoracentesis 9/22. Thoracentesis 12/3 L chest 1.6L removed. Stable ORA - pulmonology consulted, IR for thoracentesis.  - watch respiratory status closely - trial of IV lasix helped her edema.    Hx of CAD: s/p of recent DES. - restarting plavix/eliquis s/p procedure -close monitoring of chest pain development.    Peritoneal hematoma: Hgb mild drop 10.3>>>8.8>7.6.Blood loss from wound is minimal. - transfuse 1 unit pRBC 12/5 - closely monitor hematoma -Follow-up CBC am - transfusion threshold is <8   Essential hypertension: Blood pressure stable -Continue Coreg which is also for A-fib - holding other home meds for transient hypotension   HFrEF- EF 25-30% on echo 11/24. Considerable edema of all extremities has greatly improved -Watch volume status closely s/p trial of IV diuretics   Hypokalemia: Potassium 3.3>3.4.  Magnesium 2.3, phosphorus 2.7 -Repleted potassium   Chronic kidney disease, stage 3a (HCC): Stable. Cr 1.02>1.14 -Follow-up with BMP am    Abnormal LFTs: her liver function has been improving.  She has ALP 80, AST 54, ALT 83, total bilirubin 2.8, which is better than recent liver function and have now normalized   Atrial fibrillation, chronic (HCC): rate controlled -Switch Eliquis to IV heparin -Continue Coreg   Diarrhea: C. difficile negative. -As needed Imodium   Chronic pain syndrome -Continue home fentanyl patch and Lyrica   HLD: -lipitor and zetia  Body mass index is 25.8 kg/m.  VTE ppx: heparin gtt  Diet:     There are no active orders of the following types: Diet, Nourishments.   Consultants: Vascular surgery ID Pulmonology IR  Subjective 04/26/23    Pt reports feeling soreness in her groin. Has not been out of bed yet since admission. Denies respiratory symptoms.    Objective   Vitals:   04/25/23 2048 04/26/23 0016 04/26/23 0332 04/26/23 0345  BP: (!) 130/52 (!) 111/48 (!) 101/41 (!) 113/44  Pulse: 82 71 76 72  Resp:  18 16 16    Temp: 97.7 F (36.5 C) (!) 97.4 F (36.3 C) (!) 97.4 F (36.3 C)   TempSrc: Oral Oral Oral   SpO2: 97% 96% 96%   Weight:      Height:        Intake/Output Summary (Last 24 hours) at 04/26/2023 0738 Last data filed at 04/26/2023 0600 Gross per 24 hour  Intake 684.64 ml  Output 750 ml  Net -65.36 ml   Filed Weights   04/22/23 2200 04/24/23 0500 04/25/23 1222  Weight: 54.4 kg 66 kg 66 kg     Physical Exam:  General: awake, alert, NAD HEENT: atraumatic, clear conjunctiva, anicteric sclera, MMM, hearing grossly normal Respiratory: normal respiratory effort. Cardiovascular: quick capillary refill, normal S1/S2, RRR, no JVD, murmurs Gastrointestinal: soft, NT, ND Nervous: A&O x3. no gross focal neurologic deficits, normal speech Extremities: 1+ pitting edema of all extremities Skin: R groin- wound vac in place and effective. No erythema or drainage appreciated.  Psychiatry: normal mood, congruent affect  Labs   I have personally reviewed the following labs and imaging studies CBC    Component Value Date/Time   WBC 10.2 04/26/2023 0216   RBC 2.73 (L) 04/26/2023 0216   HGB 7.6 (L) 04/26/2023 0216   HGB 13.6 06/01/2022 0000  HCT 24.2 (L) 04/26/2023 0216   HCT 40.5 06/01/2022 0000   PLT 263 04/26/2023 0216   PLT 132 (L) 06/01/2022 0000   MCV 88.6 04/26/2023 0216   MCV 90 06/01/2022 0000   MCH 27.8 04/26/2023 0216   MCHC 31.4 04/26/2023 0216   RDW 16.7 (H) 04/26/2023 0216   RDW 12.8 06/01/2022 0000   LYMPHSABS 0.7 04/22/2023 1528   LYMPHSABS 0.7 08/30/2021 0917   MONOABS 1.2 (H) 04/22/2023 1528   EOSABS 0.2 04/22/2023 1528   EOSABS 0.0 08/30/2021 0917   BASOSABS 0.0 04/22/2023 1528   BASOSABS 0.0 08/30/2021 0917      Latest Ref Rng & Units 04/26/2023    2:16 AM 04/24/2023    2:02 AM 04/23/2023    6:31 AM  BMP  Glucose 70 - 99 mg/dL 601  093  97   BUN 8 - 23 mg/dL 20  25  23    Creatinine 0.44 - 1.00 mg/dL 2.35  5.73  2.20   Sodium 135 - 145 mmol/L 136   139  141   Potassium 3.5 - 5.1 mmol/L 3.4  3.3  3.5   Chloride 98 - 111 mmol/L 106  105  109   CO2 22 - 32 mmol/L 24  26  22    Calcium 8.9 - 10.3 mg/dL 8.0  7.6  7.9     DG Chest Port 1 View  Result Date: 04/24/2023 CLINICAL DATA:  Status post left thoracentesis. EXAM: PORTABLE CHEST 1 VIEW COMPARISON:  April 22, 2023. FINDINGS: The heart size and mediastinal contours are within normal limits. Minimal bibasilar subsegmental atelectasis is noted with small pleural effusions. Right-sided PICC line is noted with distal tip in expected position of the SVC. The visualized skeletal structures are unremarkable. IMPRESSION: Minimal bibasilar subsegmental atelectasis with small pleural effusions. Electronically Signed   By: Lupita Raider M.D.   On: 04/24/2023 16:59   US THORACENTESIS ASP PLEURAL SPACE W/IMG GUIDE  Result Date: 04/24/2023 INDICATION: Congestive heart failure with left pleural effusion. Request for therapeutic thoracentesis. EXAM: ULTRASOUND GUIDED LEFT THORACENTESIS MEDICATIONS: 1% lidocaine 10 mL COMPLICATIONS: None immediate. PROCEDURE: An ultrasound guided thoracentesis was thoroughly discussed with the patient and questions answered. The benefits, risks, alternatives and complications were also discussed. The patient understands and wishes to proceed with the procedure. Written consent was obtained. Ultrasound was performed to localize and mark an adequate pocket of fluid in the left chest. The area was then prepped and draped in the normal sterile fashion. 1% Lidocaine was used for local anesthesia. Under ultrasound guidance a 6 Fr Safe-T-Centesis catheter was introduced. Thoracentesis was performed. The catheter was removed and a dressing applied. FINDINGS: A total of approximately 1.6 L of dark amber fluid was removed. IMPRESSION: Successful ultrasound guided left thoracentesis yielding 1.6 L of pleural fluid. No pneumothorax on post-procedure chest x-ray. Procedure performed by: Corrin Parker, PA-C Electronically Signed   By: Gilmer Mor D.O.   On: 04/24/2023 16:45    Disposition Plan & Communication  Patient status: Inpatient  Admitted From: Home Planned disposition location: Home Anticipated discharge date: TBD pending clinical improvement  Family Communication: 2 daughters at bedside   Author: Leeroy Bock, DO [facility] 04/26/2023, 7:38 AM   Available by Epic secure chat 7AM-7PM. If 7PM-7AM, please contact night-coverage.  [facility] contact information found on [email or web address].

## 2023-04-27 ENCOUNTER — Inpatient Hospital Stay: Payer: Medicare Other | Admitting: Registered Nurse

## 2023-04-27 ENCOUNTER — Encounter: Payer: Self-pay | Admitting: Internal Medicine

## 2023-04-27 ENCOUNTER — Other Ambulatory Visit: Payer: Self-pay

## 2023-04-27 ENCOUNTER — Encounter
Admission: EM | Disposition: A | Discharge status: Home Health | Payer: Self-pay | Source: Home / Self Care | Attending: Internal Medicine

## 2023-04-27 DIAGNOSIS — B952 Enterococcus as the cause of diseases classified elsewhere: Secondary | ICD-10-CM | POA: Diagnosis not present

## 2023-04-27 DIAGNOSIS — I482 Chronic atrial fibrillation, unspecified: Secondary | ICD-10-CM | POA: Diagnosis not present

## 2023-04-27 DIAGNOSIS — I2510 Atherosclerotic heart disease of native coronary artery without angina pectoris: Secondary | ICD-10-CM | POA: Diagnosis not present

## 2023-04-27 DIAGNOSIS — T8142XA Infection following a procedure, deep incisional surgical site, initial encounter: Secondary | ICD-10-CM | POA: Diagnosis not present

## 2023-04-27 DIAGNOSIS — I97630 Postprocedural hematoma of a circulatory system organ or structure following a cardiac catheterization: Secondary | ICD-10-CM | POA: Diagnosis not present

## 2023-04-27 DIAGNOSIS — T8131XA Disruption of external operation (surgical) wound, not elsewhere classified, initial encounter: Secondary | ICD-10-CM | POA: Diagnosis not present

## 2023-04-27 DIAGNOSIS — I509 Heart failure, unspecified: Secondary | ICD-10-CM | POA: Diagnosis not present

## 2023-04-27 DIAGNOSIS — L03314 Cellulitis of groin: Secondary | ICD-10-CM | POA: Diagnosis not present

## 2023-04-27 HISTORY — PX: APPLICATION OF WOUND VAC: SHX5189

## 2023-04-27 HISTORY — PX: INCISION AND DRAINAGE OF WOUND: SHX1803

## 2023-04-27 LAB — CBC
HCT: 28.5 % — ABNORMAL LOW (ref 36.0–46.0)
Hemoglobin: 9.4 g/dL — ABNORMAL LOW (ref 12.0–15.0)
MCH: 28.1 pg (ref 26.0–34.0)
MCHC: 33 g/dL (ref 30.0–36.0)
MCV: 85.3 fL (ref 80.0–100.0)
Platelets: 279 10*3/uL (ref 150–400)
RBC: 3.34 MIL/uL — ABNORMAL LOW (ref 3.87–5.11)
RDW: 16.4 % — ABNORMAL HIGH (ref 11.5–15.5)
WBC: 10.6 10*3/uL — ABNORMAL HIGH (ref 4.0–10.5)
nRBC: 0 % (ref 0.0–0.2)

## 2023-04-27 LAB — TYPE AND SCREEN
ABO/RH(D): O POS
Antibody Screen: NEGATIVE
Unit division: 0

## 2023-04-27 LAB — BPAM RBC
Blood Product Expiration Date: 202501032359
ISSUE DATE / TIME: 202412051435
Unit Type and Rh: 5100

## 2023-04-27 LAB — PROTEIN, URINE, RANDOM: Total Protein, Urine: 44 mg/dL

## 2023-04-27 LAB — CULTURE, BLOOD (ROUTINE X 2): Culture: NO GROWTH

## 2023-04-27 SURGERY — IRRIGATION AND DEBRIDEMENT WOUND
Anesthesia: General | Laterality: Right

## 2023-04-27 MED ORDER — PROPOFOL 1000 MG/100ML IV EMUL
INTRAVENOUS | Status: AC
  Filled 2023-04-27: qty 100

## 2023-04-27 MED ORDER — FENTANYL CITRATE (PF) 100 MCG/2ML IJ SOLN
INTRAMUSCULAR | Status: DC | PRN
  Administered 2023-04-27: 50 ug via INTRAVENOUS

## 2023-04-27 MED ORDER — PHENYLEPHRINE 80 MCG/ML (10ML) SYRINGE FOR IV PUSH (FOR BLOOD PRESSURE SUPPORT)
PREFILLED_SYRINGE | INTRAVENOUS | Status: AC
  Filled 2023-04-27: qty 10

## 2023-04-27 MED ORDER — PHENYLEPHRINE 80 MCG/ML (10ML) SYRINGE FOR IV PUSH (FOR BLOOD PRESSURE SUPPORT)
PREFILLED_SYRINGE | INTRAVENOUS | Status: DC | PRN
  Administered 2023-04-27: 320 ug via INTRAVENOUS
  Administered 2023-04-27: 240 ug via INTRAVENOUS
  Administered 2023-04-27: 160 ug via INTRAVENOUS

## 2023-04-27 MED ORDER — 0.9 % SODIUM CHLORIDE (POUR BTL) OPTIME
TOPICAL | Status: DC | PRN
  Administered 2023-04-27: 200 mL

## 2023-04-27 MED ORDER — DEXMEDETOMIDINE HCL IN NACL 200 MCG/50ML IV SOLN
INTRAVENOUS | Status: DC | PRN
  Administered 2023-04-27 (×2): 6 ug via INTRAVENOUS

## 2023-04-27 MED ORDER — PROPOFOL 500 MG/50ML IV EMUL
INTRAVENOUS | Status: DC | PRN
  Administered 2023-04-27: 125 ug/kg/min via INTRAVENOUS
  Administered 2023-04-27: 30 mg via INTRAVENOUS

## 2023-04-27 MED ORDER — ONDANSETRON HCL 4 MG/2ML IJ SOLN
INTRAMUSCULAR | Status: DC | PRN
  Administered 2023-04-27: 4 mg via INTRAVENOUS

## 2023-04-27 MED ORDER — FENTANYL CITRATE (PF) 100 MCG/2ML IJ SOLN
INTRAMUSCULAR | Status: AC
  Filled 2023-04-27: qty 2

## 2023-04-27 MED ORDER — OXYCODONE HCL 5 MG PO TABS
ORAL_TABLET | ORAL | Status: AC
  Filled 2023-04-27: qty 1

## 2023-04-27 MED ORDER — ONDANSETRON HCL 4 MG/2ML IJ SOLN
INTRAMUSCULAR | Status: AC
  Filled 2023-04-27: qty 2

## 2023-04-27 MED ORDER — VASOPRESSIN 20 UNIT/ML IV SOLN
INTRAVENOUS | Status: AC
  Filled 2023-04-27: qty 1

## 2023-04-27 MED ORDER — DEXAMETHASONE SODIUM PHOSPHATE 10 MG/ML IJ SOLN
INTRAMUSCULAR | Status: DC | PRN
  Administered 2023-04-27: 10 mg via INTRAVENOUS

## 2023-04-27 MED ORDER — OXYCODONE HCL 5 MG PO TABS
5.0000 mg | ORAL_TABLET | Freq: Once | ORAL | Status: AC
  Administered 2023-04-27: 5 mg via ORAL

## 2023-04-27 MED ORDER — ACETAMINOPHEN 10 MG/ML IV SOLN
INTRAVENOUS | Status: AC
  Filled 2023-04-27: qty 100

## 2023-04-27 MED ORDER — LIDOCAINE HCL (PF) 2 % IJ SOLN
INTRAMUSCULAR | Status: AC
  Filled 2023-04-27: qty 5

## 2023-04-27 MED ORDER — EPHEDRINE SULFATE (PRESSORS) 50 MG/ML IJ SOLN
INTRAMUSCULAR | Status: AC
  Filled 2023-04-27: qty 1

## 2023-04-27 MED ORDER — ONDANSETRON HCL 4 MG/2ML IJ SOLN: 4.0000 mg | Freq: Once | INTRAMUSCULAR | Status: DC | PRN

## 2023-04-27 MED ORDER — VASOPRESSIN 20 UNIT/ML IV SOLN
INTRAVENOUS | Status: DC | PRN
  Administered 2023-04-27 (×2): 2 [IU] via INTRAVENOUS
  Administered 2023-04-27: 3 [IU] via INTRAVENOUS
  Administered 2023-04-27 (×2): 1 [IU] via INTRAVENOUS
  Administered 2023-04-27: 3 [IU] via INTRAVENOUS
  Administered 2023-04-27: 1 [IU] via INTRAVENOUS
  Administered 2023-04-27: 2 [IU] via INTRAVENOUS

## 2023-04-27 MED ORDER — PROPOFOL 10 MG/ML IV BOLUS
INTRAVENOUS | Status: AC
  Filled 2023-04-27: qty 20

## 2023-04-27 MED ORDER — ACETAMINOPHEN 10 MG/ML IV SOLN
INTRAVENOUS | Status: DC | PRN
  Administered 2023-04-27: 1000 mg via INTRAVENOUS

## 2023-04-27 MED ORDER — EPHEDRINE SULFATE (PRESSORS) 50 MG/ML IJ SOLN
10.0000 mg | Freq: Once | INTRAMUSCULAR | Status: AC
  Administered 2023-04-27: 10 mg via INTRAVENOUS

## 2023-04-27 MED ORDER — FENTANYL CITRATE (PF) 100 MCG/2ML IJ SOLN: 25.0000 ug | INTRAMUSCULAR | Status: DC | PRN

## 2023-04-27 MED ORDER — DEXAMETHASONE SODIUM PHOSPHATE 10 MG/ML IJ SOLN
INTRAMUSCULAR | Status: AC
  Filled 2023-04-27: qty 1

## 2023-04-27 SURGICAL SUPPLY — 20 items
CANISTER WOUND CARE 500ML ATS (WOUND CARE) IMPLANT
DRAPE INCISE IOBAN 66X45 STRL (DRAPES) IMPLANT
DRAPE LAPAROTOMY 100X72X124 (DRAPES) IMPLANT
DRSG VAC GRANUFOAM MED (GAUZE/BANDAGES/DRESSINGS) IMPLANT
ELECT REM PT RETURN 9FT ADLT (ELECTROSURGICAL) ×1
ELECTRODE REM PT RTRN 9FT ADLT (ELECTROSURGICAL) ×1 IMPLANT
GAUZE SPONGE 4X4 12PLY STRL (GAUZE/BANDAGES/DRESSINGS) IMPLANT
GLOVE BIO SURGEON STRL SZ7 (GLOVE) ×1 IMPLANT
GLOVE SURG SYN 8.0 (GLOVE) ×1 IMPLANT
GLOVE SURG SYN 8.0 PF PI (GLOVE) ×1 IMPLANT
GOWN STRL REUS W/ TWL LRG LVL3 (GOWN DISPOSABLE) ×2 IMPLANT
GOWN STRL REUS W/ TWL XL LVL3 (GOWN DISPOSABLE) ×1 IMPLANT
KIT TURNOVER KIT A (KITS) ×1 IMPLANT
MANIFOLD NEPTUNE II (INSTRUMENTS) ×1 IMPLANT
NS IRRIG 500ML POUR BTL (IV SOLUTION) ×1 IMPLANT
PACK BASIN MINOR ARMC (MISCELLANEOUS) IMPLANT
PAD PREP OB/GYN DISP 24X41 (PERSONAL CARE ITEMS) ×1 IMPLANT
SOL PREP PVP 2OZ (MISCELLANEOUS) ×2
SOLUTION PREP PVP 2OZ (MISCELLANEOUS) ×1 IMPLANT
TRAP FLUID SMOKE EVACUATOR (MISCELLANEOUS) ×1 IMPLANT

## 2023-04-27 NOTE — Evaluation (Signed)
Physical Therapy Evaluation Patient Details Name: Dawn Bruce MRN: [id number] DOB: [birth year] Today's Date: 04/27/2023  History of Present Illness  Pt is a 77 years old admitted 04/22/23 for cellulitis of right groin. Procedure dated 04/25/23 for (1) excisional debridement of right groin and (2) application of VAC wound dressing.   Clinical Impression  Pt received in bed with husband at bedside and agreed to PT session. Pt reported pain was previously at an 8/10 prior to medications. Pt performed bed mobility CGA for RLE with HOB elevated/rails, STS with the use of RW (2wheels) CGA, and amb ~42ft with RW CGA prior to ending session in bed due to pain and fatigue. Pt's [place] dislodged during session, RN notified. Pt tolerated Tx well and will continue to benefit from skilled PT sessions to improve activity tolerance and functional mobility to maximize safety/return to PLOF following D/C.        If plan is discharge home, recommend the following: A little help with walking and/or transfers;A little help with bathing/dressing/bathroom;Help with stairs or ramp for entrance;Assist for transportation   Can travel by private vehicle        Equipment Recommendations None recommended by PT  Recommendations for Other Services       Functional Status Assessment Patient has had a recent decline in their functional status and demonstrates the ability to make significant improvements in function in a reasonable and predictable amount of time.     Precautions / Restrictions Precautions Precautions: Fall Restrictions Weight Bearing Restrictions: No RLE Weight Bearing: Weight bearing as tolerated      Mobility  Bed Mobility Overal bed mobility: Needs Assistance Bed Mobility: Supine to Sit, Sit to Supine     Supine to sit: Contact guard Sit to supine: Contact guard assist   General bed mobility comments: CGA necessary for RLE management.    Transfers Overall transfer level: Needs  assistance Equipment used: Rolling walker (2 wheels) Transfers: Sit to/from Stand Sit to Stand: Contact guard assist           General transfer comment: STS with the use of RW, pt did not report any s/sx relative to dizziness when standing    Ambulation/Gait Ambulation/Gait assistance: Contact guard assist Gait Distance (Feet): 40 Feet Assistive device: Rolling walker (2 wheels) Gait Pattern/deviations: Step-through pattern, Antalgic Gait velocity: decreased     General Gait Details: Pt amb with the use of RW. VC necessary for RW management  Stairs            Wheelchair Mobility     Tilt Bed    Modified Rankin (Stroke Patients Only)       Balance Overall balance assessment: Needs assistance Sitting-balance support: Feet supported Sitting balance-Leahy Scale: Good     Standing balance support: Bilateral upper extremity supported, During functional activity Standing balance-Leahy Scale: Fair                               Pertinent Vitals/Pain Pain Assessment Pain Assessment: Faces Faces Pain Scale: Hurts a little bit Pain Location: RLE Pain Descriptors / Indicators: Aching, Constant Pain Intervention(s): Monitored during session, Premedicated before session    Home Living Family/patient expects to be discharged to:: Private residence Living Arrangements: Spouse/significant other Available Help at Discharge: Family Type of Home: House Home Access: Level entry       Home Layout: One level Home Equipment: Shower seat;Grab bars - tub/shower;Rolling Walker (2 wheels);Grab bars - toilet;BSC/3in1  Prior Function Prior Level of Function : Independent/Modified Independent;Driving             Mobility Comments: independent without device ADLs Comments: Pt reports IND prior to admission     Extremity/Trunk Assessment   Upper Extremity Assessment Upper Extremity Assessment: Overall WFL for tasks assessed    Lower Extremity  Assessment Lower Extremity Assessment: RLE deficits/detail RLE Deficits / Details: cellulitis of right groin recovery       Communication   Communication Communication: No apparent difficulties Cueing Techniques: Verbal cues  Cognition Arousal: Alert Behavior During Therapy: WFL for tasks assessed/performed Overall Cognitive Status: Within Functional Limits for tasks assessed                                 General Comments: alert and oriented x4        General Comments      Exercises     Assessment/Plan    PT Assessment Patient needs continued PT services  PT Problem List Decreased activity tolerance;Decreased mobility;Pain       PT Treatment Interventions DME instruction;Gait training;Stair training;Functional mobility training;Therapeutic activities;Balance training;Neuromuscular re-education;Therapeutic exercise;Patient/family education    PT Goals (Current goals can be found in the Care Plan section)  Acute Rehab PT Goals Patient Stated Goal: to go home PT Goal Formulation: With patient/family Time For Goal Achievement: 05/11/23 Potential to Achieve Goals: Good    Frequency Min 1X/week     Co-evaluation               AM-PAC PT "6 Clicks" Mobility  Outcome Measure Help needed turning from your back to your side while in a flat bed without using bedrails?: A Little Help needed moving from lying on your back to sitting on the side of a flat bed without using bedrails?: A Little Help needed moving to and from a bed to a chair (including a wheelchair)?: A Little Help needed standing up from a chair using your arms (e.g., wheelchair or bedside chair)?: A Little Help needed to walk in hospital room?: A Little Help needed climbing 3-5 steps with a railing? : A Lot 6 Click Score: 17    End of Session Equipment Utilized During Treatment: Gait belt Activity Tolerance: Patient tolerated treatment well Patient left: in bed;with call bell/phone  within reach;with bed alarm set;with family/visitor present Nurse Communication: Mobility status PT Visit Diagnosis: Unsteadiness on feet (R26.81);Other abnormalities of gait and mobility (R26.89);Muscle weakness (generalized) (M62.81);Difficulty in walking, not elsewhere classified (R26.2);Pain Pain - Right/Left: Right Pain - part of body: Leg    Time: 0940-1003 PT Time Calculation (min) (ACUTE ONLY): 23 min   Charges:   PT Evaluation $PT Eval Moderate Complexity: 1 Mod PT Treatments $Gait Training: 8-22 mins PT General Charges $$ ACUTE PT VISIT: 1 Visit         Dawn Bruce SPT, LAT, ATC  Dawn Bruce 04/27/2023, 2:00 PM

## 2023-04-27 NOTE — Interval H&P Note (Signed)
History and Physical Interval Note:  04/27/2023 2:30 PM  Dawn Bruce  has presented today for surgery, with the diagnosis of Right Groin wound infection.  The various methods of treatment have been discussed with the patient and family. After consideration of risks, benefits and other options for treatment, the patient has consented to  Procedure(s): IRRIGATION AND DEBRIDEMENT WOUND (Right) WOUND VAC EXCHANGE (Right) as a surgical intervention.  The patient's history has been reviewed, patient examined, no change in status, stable for surgery.  I have reviewed the patient's chart and labs.  Questions were answered to the patient's satisfaction.     [name]

## 2023-04-27 NOTE — Consult Note (Signed)
Pharmacy Antibiotic Note  Dawn Bruce is a 77 y.o. female with medical history significant of dCHF, CAD (s/p of recent DES), A fib on Eliquis, HTN, HLD, CDK-3a, chronic pain syndrome, multiple skin cancer, perforated appendicitis, former smoker admitted on 04/22/2023 with post-operative  right groin infection . ID following.  Pharmacy has been consulted for cefepime dosing. Patient is also ordered linezolid.  Today, 04/27/2023 Day 5 linezolid and cefepime Renal: SCr 10.8 on 12/5 (stable), CrCl 40 ml/min WBC 10.6, overall it is improved Afebrile 12/2 groin wound culture: E coli, M. Morganii, P mirabilis 12/4 OR culture pending S/p I&D and hematoma drainage on 12/4  Plan: Continue with Cefepime 2 g IV q12h Follow renal function Follow most recent culture results ID following  Height: 5' 2.99" (160 cm) Weight: 71.2 kg (156 lb 15.5 oz) IBW/kg (Calculated) : 52.38  Temp (24hrs), Avg:97.8 F (36.6 C), Min:97.7 F (36.5 C), Max:98.1 F (36.7 C)  Recent Labs  Lab 04/22/23 1528 04/23/23 0631 04/24/23 0202 04/25/23 0453 04/26/23 0216 04/26/23 1743 04/27/23 0500  WBC 21.3* 14.5* 14.5* 12.4* 10.2 11.0* 10.6*  CREATININE 1.17* 1.02* 1.14*  --  1.08*  --   --   LATICACIDVEN 1.7  --   --   --   --   --   --     Estimated Creatinine Clearance: 39.9 mL/min (A) (by C-G formula based on SCr of 1.08 mg/dL (H)).    No Known Allergies  Antimicrobials this admission: Vancomycin 12/1 x 1 Ceftriaxone 12/1 >> 12/2 Cefepime 12/2 >>  Linezolid 12/2 >>   Dose adjustments this admission: N/A  Microbiology results: 12/1 BCx: NGTD 12/1 C diff: (-) 12/1 GIP: (-) 12/2 Wound culture: see above  Thank you for allowing pharmacy to be a part of this patient's care.  Juliette Alcide, PharmD, BCPS, BCIDP Work Cell: [phone] 04/27/2023 10:06 AM

## 2023-04-27 NOTE — Progress Notes (Signed)
PROGRESS NOTE  Dawn Bruce    DOB: [birth year], 77 y.o.  ZOX:[id number]    Code Status: Full Code   DOA: 04/22/2023   LOS: 5  Brief hospital course  Dawn Bruce is a 77 y.o. female with medical history significant of dCHF, CAD (s/p of recent DES), A fib on Eliquis, HTN, HLD, CDK-3a, chronic pain syndrome, multiple skin cancer, perforated appendicitis, former smoker, who presents with right groin pain.   Patient was recently hospitalized from 11/21 - 11/29 due to non-STEMI.  Patient is s/p of LHC with DES placement. Dawn Bruce developed retroperitoneal hematoma & hemorrhagic shock due to complication of Iliac artery laceration during left heart cath of right groin. Dawn Bruce underwent surgical repair of external iliac artery by [facility] on 11/23.   Presented back to ED 12/1 due to increased redness, swelling, and pain at incision site of the cath. Failed outpatient Abx x1 dose.  Hypotensive to 80/50 on presentation.    ED Course: Dawn Bruce was found to have WBC 21.3, BNP 626, stable renal function, potassium 3.3, Mg 2.3, phosphorus 2.7, lactic acid of 1.7, C diff negative. Temperature normal, blood pressure 141/74, heart rate 104, RR 29, oxygen saturation 98% on room air.   Dr. Myra Gianotti of [facility] is consulted by EDP. WJX:[id number] development of moderate size left pleural effusion with probable underlying atelectasis or infiltrate. CT-abd/pelvis: Moderate sized right retroperitoneal hematoma is noted which extends into right pelvic region and displaces urinary bladder to the left. The pelvic component appears to be mildly enlarged compared to prior exam. Bilateral pleural effusions are noted, left greater than right, with associated atelectasis of the lower lobes. Likely from her HF.   EKG:  QTc 532, T wave inversion in inferior leads and V2-V6  They were initially treated with potassium, CTX, heparin gtt, IV fluids, vancomycin, ibuprofen.   Patient was admitted to medicine service for further workup and management of  cellulitis as outlined in detail below.  12/2: pulmonology, and IR consulted to address pleural effusion. ID consulted to address abx course. Transitioned to linezolid. Vascular evaluated and recommended debridement 12/4. Wound care continues. Afebrile.  12/3: vitals stable, afebrile. Wound appears much improved and pain has improved. Debridement planned for tomorrow. Thoracentesis removed 1.6L left side 12/4:stable ORA, afebrile. Wound clinically greatly improved. Debridement today. Plan to stop heparin gtt and return to eliquis + DAPT s/p procedure 04/27/23 -   Assessment & Plan  Principal Problem:   Cellulitis of right groin Active Problems:   Sepsis (HCC)   CAD (coronary artery disease)   Peritoneal hematoma   Essential hypertension   Chronic diastolic heart failure (HCC)   Hypokalemia   Chronic kidney disease, stage 3a (HCC)   Pleural effusion   Abnormal LFTs   Atrial fibrillation, chronic (HCC)   Diarrhea   Chronic pain syndrome   HLD (hyperlipidemia)   HFrEF (heart failure with reduced ejection fraction) (HCC)   Severe sepsis (HCC)   Infected wound   Cellulitis of right thigh  Sepsis due to cellulitis of right groin: sepsis criteria met on admission with WBC 21.3, heart rate 104, RR 29.  Lactic acid normal 1.7.  infection source is superficial groin. Dr. Myra Gianotti of [facility] removed several staples, probed and packed wound of right groin and she was started on IV Abx in ED.  WBC improving, she remains afebrile. Wound showing improvement in drainage and redness today. Wound-vac in place BxCx NGTD. Wound cultures have multiple species. Sensitivities returned. Pending ID update for antibiotic choice - ID  following  - linezolid + cefepime - follow [facility]. Debridement with wound vac placement 12/4. Additional wash out 12/6 - Dawn Bruce recommending HH - analgesia PRN  Hypervolemia  Bilateral Pleural effusions- incidental findings- L worse than R. Denies respiratory symptoms. Had recent echo  showing HFrEF. Stable ORA. Of note, had thoracentesis 9/22. Thoracentesis 12/3 L chest 1.6L removed. Stable ORA - pulmonology consulted, IR for thoracentesis.  - watch respiratory status closely - trial of IV lasix helped her edema.    Hx of CAD: s/p of recent DES. - restarted plavix 12/5. Was not held prior to second debridement -close monitoring of chest pain development.    Peritoneal hematoma: Hgb mild drop 10.3>>>8.8>7.6.Blood loss from wound is minimal.  - transfuse 1 unit pRBC 12/5 and hgb now stable above 9 - closely monitor hematoma -Follow-up CBC am - transfusion threshold is <8   Essential hypertension: Blood pressure stable -Continue Coreg which is also for A-fib - holding other home meds for transient hypotension   HFrEF- EF 25-30% on echo 11/24. Considerable edema of all extremities has greatly improved -Watch volume status closely s/p trial of IV diuretics   Hypokalemia: Potassium 3.3>3.4.  Magnesium 2.3, phosphorus 2.7 -Repleted potassium   Chronic kidney disease, stage 3a (HCC): Stable. Cr 1.02>1.14 -Follow-up with BMP am    Abnormal LFTs: her liver function has been improving.  She has ALP 80, AST 54, ALT 83, total bilirubin 2.8, which is better than recent liver function and have now normalized   Atrial fibrillation, chronic (HCC): rate controlled -Switch back to eliquis -Continue Coreg   Diarrhea: C. difficile negative. -As needed Imodium   Chronic pain syndrome -Continue home fentanyl patch and Lyrica   HLD: -lipitor and zetia  Body mass index is 27.81 kg/m.  VTE ppx: apixaban (ELIQUIS) tablet 2.5 mg Start: 04/26/23 2000heparin gttapixaban (ELIQUIS) tablet 2.5 mg   Diet:     Diet   Diet NPO time specified Except for: Sips with Meds   Consultants: Vascular surgery ID Pulmonology IR  Subjective 04/27/23    Dawn Bruce reports feeling well today. She is up and walking with Dawn Bruce at time of encounter. No complaints today   Objective   Vitals:    04/26/23 1728 04/26/23 2003 04/27/23 0341 04/27/23 0500  BP: (!) 128/58 (!) 141/84 124/61   Pulse: 82 84 83   Resp: 18 16 16    Temp: 97.9 F (36.6 C) 98.1 F (36.7 C) 97.8 F (36.6 C)   TempSrc:  Oral Oral   SpO2: 100% 96% 97%   Weight:    71.2 kg  Height:        Intake/Output Summary (Last 24 hours) at 04/27/2023 0727 Last data filed at 04/27/2023 0601 Gross per 24 hour  Intake 2111.73 ml  Output 525 ml  Net 1586.73 ml   Filed Weights   04/24/23 0500 04/25/23 1222 04/27/23 0500  Weight: 66 kg 66 kg 71.2 kg     Physical Exam:  General: awake, alert, NAD HEENT: atraumatic, clear conjunctiva, anicteric sclera, MMM, hearing grossly normal Respiratory: normal respiratory effort. Cardiovascular: quick capillary refill, normal S1/S2, RRR, no JVD, murmurs Gastrointestinal: soft, NT, ND Nervous: A&O x3. no gross focal neurologic deficits, normal speech Extremities: 1+ pitting edema of all extremities Skin: R groin- wound vac in place and effective. No erythema or drainage appreciated.  Psychiatry: normal mood, congruent affect  Labs   I have personally reviewed the following labs and imaging studies CBC    Component Value Date/Time  WBC 10.6 (H) 04/27/2023 0500   RBC 3.34 (L) 04/27/2023 0500   HGB 9.4 (L) 04/27/2023 0500   HGB 13.6 06/01/2022 0000   HCT 28.5 (L) 04/27/2023 0500   HCT 40.5 06/01/2022 0000   PLT 279 04/27/2023 0500   PLT 132 (L) 06/01/2022 0000   MCV 85.3 04/27/2023 0500   MCV 90 06/01/2022 0000   MCH 28.1 04/27/2023 0500   MCHC 33.0 04/27/2023 0500   RDW 16.4 (H) 04/27/2023 0500   RDW 12.8 06/01/2022 0000   LYMPHSABS 0.7 04/22/2023 1528   LYMPHSABS 0.7 08/30/2021 0917   MONOABS 1.2 (H) 04/22/2023 1528   EOSABS 0.2 04/22/2023 1528   EOSABS 0.0 08/30/2021 0917   BASOSABS 0.0 04/22/2023 1528   BASOSABS 0.0 08/30/2021 0917      Latest Ref Rng & Units 04/26/2023    2:16 AM 04/24/2023    2:02 AM 04/23/2023    6:31 AM  BMP  Glucose 70 - 99 mg/dL 161   096  97   BUN 8 - 23 mg/dL 20  25  23    Creatinine 0.44 - 1.00 mg/dL 0.45  4.09  8.11   Sodium 135 - 145 mmol/L 136  139  141   Potassium 3.5 - 5.1 mmol/L 3.4  3.3  3.5   Chloride 98 - 111 mmol/L 106  105  109   CO2 22 - 32 mmol/L 24  26  22    Calcium 8.9 - 10.3 mg/dL 8.0  7.6  7.9     No results found.  Disposition Plan & Communication  Patient status: Inpatient  Admitted From: Home Planned disposition location: Home Anticipated discharge date: TBD pending clinical improvement  Family Communication: spouse and daughter at bedside   Author: Leeroy Bock, DO [facility] 04/27/2023, 7:27 AM   Available by Epic secure chat 7AM-7PM. If 7PM-7AM, please contact night-coverage.  [facility] contact information found on [email or web address].

## 2023-04-27 NOTE — Op Note (Signed)
    OPERATIVE NOTE   PROCEDURE: Excisional debridement right groin wound. Placement of a nondisposable VAC wound dressing right groin  PRE-OPERATIVE DIAGNOSIS: Right groin wound infection status post pseudoaneurysm repair secondary to cardiac catheterization  POST-OPERATIVE DIAGNOSIS: Same  SURGEON: [name]  ASSISTANT(S): Rolla Plate, NP  ANESTHESIA: MAC  ESTIMATED BLOOD LOSS: 10 cc  FINDING(S): Significantly more granulation tissue in the base of the wound.  Still some slough and superficial necrotic tissue.  No frank purulence identified  SPECIMEN(S): Debrided tissue is not sent for specimen  INDICATIONS:   Dawn Bruce is a 77 y.o. female who presents with right groin wound infection status post cardiac catheterization and subsequent pseudoaneurysm repair.  She is returning to the operating room now for [facility] dressing change.  Risks and benefits of been reviewed all questions answered patient agrees to proceed.  DESCRIPTION: After full informed written consent was obtained from the patient, the patient was brought back to the operating room and placed supine upon the operating table.  Prior to induction, the patient received IV antibiotics.   After obtaining adequate anesthesia, the patient was then prepped and draped in the standard fashion for a debridement of the right groin wound and changing of the VAC dressing.  Existing VAC dressing is removed without difficulty.  The wound is debrided with a curette the nonviable tissue and slough is all relatively speaking superficial no further debridement of tendons or muscle is needed.  The wound bed floor remains intact covering the arteries.  There is early signs of diffuse granulation tissue.  The hematoma cavity does not have any evidence for purulence.  The wound is then irrigated with sterile saline.  The wound measures 16 cm x 8 cm.  This represents 128 cm  A wound VAC dressing is then applied with good seal in the  operating room.   The patient tolerated this procedure well.   COMPLICATIONS: None  CONDITION: Dawn Bruce [facility]  Office: [phone]   04/27/2023, 5:25 PM

## 2023-04-27 NOTE — Transfer of Care (Signed)
Immediate Anesthesia Transfer of Care Note  Patient: Fabienne Bruns  Procedure(s) Performed: IRRIGATION AND DEBRIDEMENT WOUND (Right) WOUND VAC EXCHANGE (Right)  Patient Location: PACU  Anesthesia Type:General  Level of Consciousness: sedated  Airway & Oxygen Therapy: Patient Spontanous Breathing and Patient connected to face mask oxygen  Post-op Assessment: Report given to RN and Post -op Vital signs reviewed and unstable, Anesthesiologist notified  Post vital signs: Reviewed and stable  Last Vitals:  Vitals Value Taken Time  BP 79/36 04/27/23 1715  Temp 36.1 C 04/27/23 1707  Pulse 60 04/27/23 1719  Resp 14 04/27/23 1719  SpO2 98 % 04/27/23 1719  Vitals shown include unfiled device data.  Last Pain:  Vitals:   04/27/23 1707  TempSrc:   PainSc: Asleep      Patients Stated Pain Goal: 0 (04/27/23 0920)  Complications: No notable events documented.

## 2023-04-27 NOTE — Anesthesia Preprocedure Evaluation (Addendum)
Anesthesia Evaluation  Patient identified by MRN, date of birth, ID band Patient awake    Reviewed: Allergy & Precautions, NPO status , Patient's Chart, lab work & pertinent test results  History of Anesthesia Complications Negative for: history of anesthetic complications  Airway Mallampati: IV   Neck ROM: Full    Dental no notable dental hx.    Pulmonary former smoker (quit 1990) Pleural effusion s/p thoracentesis yielding 1.6 L fluid 04/24/23   Pulmonary exam normal breath sounds clear to auscultation       Cardiovascular hypertension, + CAD (s/p MI 04/12/23 and stents on Plavix), + Peripheral Vascular Disease (s/p CEA) and +CHF (EF 25-30%)  Normal cardiovascular exam+ dysrhythmias (a fib on Eliquis)  Rhythm:Regular Rate:Normal  ECG 04/22/23: a flutter; T abnormality; prolonged QT; no STEMI   Neuro/Psych Chronic pain    GI/Hepatic ,GERD  ,,  Endo/Other  negative endocrine ROS    Renal/GU Renal disease (stage III CKD)     Musculoskeletal   Abdominal   Peds  Hematology negative hematology ROS (+)   Anesthesia Other Findings   Reproductive/Obstetrics                              Anesthesia Physical Anesthesia Plan  ASA: 4  Anesthesia Plan: General   Post-op Pain Management:    Induction: Intravenous  PONV Risk Score and Plan: 3 and Ondansetron, Dexamethasone, Treatment may vary due to age or medical condition, Propofol infusion and TIVA  Airway Management Planned: Natural Airway  Additional Equipment:   Intra-op Plan:   Post-operative Plan:   Informed Consent: I have reviewed the patients History and Physical, chart, labs and discussed the procedure including the risks, benefits and alternatives for the proposed anesthesia with the patient or authorized representative who has indicated his/her understanding and acceptance.     Dental advisory given  Plan Discussed with:  CRNA  Anesthesia Plan Comments: (LMA/GETA backup discussed.  Patient consented for risks of anesthesia including but not limited to:  - adverse reactions to medications - damage to eyes, teeth, lips or other oral mucosa - nerve damage due to positioning  - sore throat or hoarseness - damage to heart, brain, nerves, lungs, other parts of body or loss of life  Informed patient about role of CRNA in peri- and intra-operative care.  Patient voiced understanding.)         Anesthesia Quick Evaluation

## 2023-04-27 NOTE — TOC Progression Note (Signed)
Transition of Care [facility]) - Progression Note    Patient Details  Name: Dawn Bruce MRN: [id number] Date of Birth: June 24, 1945  Transition of Care [facility]) CM/SW Contact  Chapman Fitch, RN Phone Number: 04/27/2023, 12:22 PM  Clinical Narrative:     Plan for back to the operating room today on 04/27/2023 for wound washout and wound VAC exchange  - PT eval pending - ID following - if patient returns home TOC will need 24 hour notice to arrange wound vac      Expected Discharge Plan and Services                                               Social Determinants of Health (SDOH) Interventions SDOH Screenings   Food Insecurity: No Food Insecurity (04/23/2023)  Housing: Low Risk  (04/23/2023)  Transportation Needs: No Transportation Needs (04/23/2023)  Utilities: Not At Risk (04/23/2023)  Alcohol Screen: Low Risk  (12/08/2022)  Depression (PHQ2-9): Low Risk  (12/08/2022)  Financial Resource Strain: Low Risk  (10/13/2022)  Physical Activity: Insufficiently Active (10/13/2022)  Social Connections: Unknown (10/13/2022)  Stress: No Stress Concern Present (10/13/2022)  Tobacco Use: Medium Risk (04/25/2023)    Readmission Risk Interventions     No data to display

## 2023-04-27 NOTE — Anesthesia Postprocedure Evaluation (Signed)
Anesthesia Post Note  Patient: Dawn Bruce  Procedure(s) Performed: IRRIGATION AND DEBRIDEMENT WOUND (Right) WOUND VAC EXCHANGE (Right)  Patient location during evaluation: PACU Anesthesia Type: General Level of consciousness: awake and alert, oriented and patient cooperative Pain management: pain level controlled Vital Signs Assessment: post-procedure vital signs reviewed and stable Respiratory status: spontaneous breathing, nonlabored ventilation and respiratory function stable Cardiovascular status: blood pressure returned to baseline and stable Postop Assessment: adequate PO intake Anesthetic complications: no   No notable events documented.   Last Vitals:  Vitals:   04/27/23 1736 04/27/23 1744  BP:  (!) 113/52  Pulse: 70 66  Resp: 18 20  Temp:  36.4 C  SpO2: 94% 95%    Last Pain:  Vitals:   04/27/23 1730  TempSrc:   PainSc: Asleep                 Reed Breech

## 2023-04-27 NOTE — Plan of Care (Signed)

## 2023-04-27 NOTE — Progress Notes (Signed)
PULMONOLOGY         Date: 04/27/2023,   MRN# [id number] Stephaine Sandidge May 19, 1946     AdmissionWeight: 54.4 kg                 CurrentWeight: 71.2 kg  Referring provider: Dr Gilda Crease   CHIEF COMPLAINT:   Complete atelectasis of left lung with bilateral pleural effusions.    HISTORY OF PRESENT ILLNESS   77 year old female with a history of chronic GERD, basal cell carcinoma squamous cell carcinoma history of NSTEMI diastolic CHF, CAD status post recent PCI with DES, chronic A-fib on Eliquis, essential hypertension, dyslipidemia CKD 3A chronic pain syndrome remote perforated appendicitis, hospitalized November 2024 with non-STEMI.  Post left heart cath had developed retroperitoneal hematoma and hemorrhagic shock with complications of iliac artery laceration.  Status post surgical repair via vascular surgery service.  Had outpatient therapy with antibiotics with failure came in with transient hypotension and circulatory shock.  Found to have elevated cardiac biomarkers and acute on chronic CHF.  Vascular surgery was reconsulted today and PCCM consultation was placed to incidental finding of complete atelectasis of the left lung with surrounding bilateral pleural effusions worse on the left.  04/25/23- patient evaluated in PACU post op , appears to be mildy sedated still. Mildy hypotensive at this moment.  Lung sounds much improved post 1.6L left pleural space aspiration.  04/26/23- patient seen and examined , [name] (daughter) at bedside.  Patient receiving blood transfusion.  Lung sounds are clear , vitals are stable.  Hypoalbulminemia continues. Will order urine protein and pre albumin levels,  because patient shares she's eating well.  CXR in am to check interval effusion.  04/27/23- patient seen at bedside, husband and daughter present.  For surgery wound washout and vac exchange today.   Vitals stable , room air spO2 99%    PAST MEDICAL HISTORY   Past Medical History:   Diagnosis Date   Actinic keratosis    Chronic pain    GERD (gastroesophageal reflux disease) ?   Taking Pepcid   History of basal cell carcinoma (BCC) 05/02/2021   right upper back paraspinal   History of SCC (squamous cell carcinoma) of skin 09/10/2019   left dorsum wrist ED&C done 10/28/19   History of SCC (squamous cell carcinoma) of skin 03/04/2020   right dorsum hand   History of squamous cell carcinoma in situ (SCCIS) 03/04/2020   left dorsum wrist  ED&C   History of squamous cell carcinoma in situ (SCCIS) 03/04/2020   right medial infraorbital  ED&C 04/28/2020   History of squamous cell carcinoma in situ (SCCIS) 10/28/2019   right dorsum hand proximal lateral   History of squamous cell carcinoma in situ (SCCIS) 10/28/2019   right dorsum hand proximal medial   Hyperlipidemia    Hypertension    NSTEMI (non-ST elevated myocardial infarction) (HCC) 04/12/2023   Renal disorder    Squamous cell carcinoma in situ 10/31/2021   left distal  tricep, EDC   Squamous cell carcinoma of skin 08/28/2022   Right volar forearm - [facility]     SURGICAL HISTORY   Past Surgical History:  Procedure Laterality Date   ABDOMINAL HYSTERECTOMY     APPENDECTOMY  April 2023   Burst appendix   APPLICATION OF WOUND VAC Right 04/25/2023   Procedure: APPLICATION OF WOUND VAC;  Surgeon: Renford Dills, MD;  Location: [facility] ORS;  Service: Vascular;  Laterality: Right;   AUGMENTATION MAMMAPLASTY Bilateral    25-30  years ago   CARDIOVERSION N/A 12/01/2020   Procedure: CARDIOVERSION;  Surgeon: Debbe Odea, MD;  Location: [facility] ORS;  Service: Cardiovascular;  Laterality: N/A;   CAROTID ARTERY ANGIOPLASTY Right 1995(approximate)   CAROTID ENDARTERECTOMY  2001   CORONARY STENT INTERVENTION N/A 04/13/2023   Procedure: CORONARY STENT INTERVENTION;  Surgeon: Marcina Millard, MD;  Location: [facility] INVASIVE CV LAB;  Service: Cardiovascular;  Laterality: N/A;   COSMETIC SURGERY     ELBOW SURGERY Left     ENDARTERECTOMY FEMORAL Right 04/13/2023   Procedure: Right groin exploration with repair of right external iliac artery and right circumflex artery;  Surgeon: Bertram Denver, MD;  Location: [facility] ORS;  Service: Vascular;  Laterality: Right;   FRACTURE SURGERY  2016   Arm   INCISION AND DRAINAGE OF WOUND Right 04/25/2023   Procedure: IRRIGATION AND DEBRIDEMENT WOUND;  Surgeon: Renford Dills, MD;  Location: [facility] ORS;  Service: Vascular;  Laterality: Right;   LEFT HEART CATH AND CORONARY ANGIOGRAPHY N/A 04/13/2023   Procedure: LEFT HEART CATH AND CORONARY ANGIOGRAPHY;  Surgeon: Marcina Millard, MD;  Location: [facility] INVASIVE CV LAB;  Service: Cardiovascular;  Laterality: N/A;   THORACENTESIS N/A 12/27/2020   Procedure: Alanson Puls;  Surgeon: Josephine Igo, DO;  Location: [facility];  Service: Pulmonary;  Laterality: N/A;   TONSILLECTOMY     TUBAL LIGATION  ?     FAMILY HISTORY   Family History  Problem Relation Age of Onset   Cancer Mother    Cancer Father    Cancer Sister    Breast cancer Neg Hx      SOCIAL HISTORY   Social History   Tobacco Use   Smoking status: Former    Current packs/day: 0.00    Average packs/day: 0.3 packs/day for 15.0 years (3.8 ttl pk-yrs)    Types: Cigarettes    Start date: 75    Quit date: 20    Years since quitting: 34.9    Passive exposure: Past   Smokeless tobacco: Never   Tobacco comments:    quit around 1990-1995 (?)  Vaping Use   Vaping status: Never Used  Substance Use Topics   Alcohol use: Yes    Alcohol/week: 14.0 standard drinks of alcohol    Types: 14 Standard drinks or equivalent per week    Comment: 2 scotch shots   Drug use: Never     MEDICATIONS    Home Medication:     Current Medication:  Current Facility-Administered Medications:    0.9 %  sodium chloride infusion, , Intravenous, Continuous, Pace, Brien R, NP, Last Rate: 75 mL/hr at 04/26/23 2332, New Bag at 04/26/23 2332   acetaminophen  (TYLENOL) tablet 650 mg, 650 mg, Oral, Q6H PRN, Schnier, Latina Craver, MD, 650 mg at 04/26/23 1913   albuterol (PROVENTIL) (2.5 MG/3ML) 0.083% nebulizer solution 2.5 mg, 2.5 mg, Nebulization, Q4H PRN, Schnier, Latina Craver, MD   alum & mag hydroxide-simeth (MAALOX/MYLANTA) 200-200-20 MG/5ML suspension 15 mL, 15 mL, Oral, Once, Schnier, Latina Craver, MD   apixaban Everlene Balls) tablet 2.5 mg, 2.5 mg, Oral, BID, Jamelle Rushing L, MD, 2.5 mg at 04/26/23 2050   aspirin EC tablet 81 mg, 81 mg, Oral, Daily, Schnier, Latina Craver, MD, 81 mg at 04/26/23 0846   atorvastatin (LIPITOR) tablet 80 mg, 80 mg, Oral, Daily, Schnier, Latina Craver, MD, 80 mg at 04/26/23 0846   carvedilol (COREG) tablet 12.5 mg, 12.5 mg, Oral, BID, Schnier, Latina Craver, MD, 12.5 mg at 04/26/23 2050   ceFEPIme (MAXIPIME)  2 g in sodium chloride 0.9 % 100 mL IVPB, 2 g, Intravenous, Q12H, Schnier, Latina Craver, MD, Last Rate: 200 mL/hr at 04/27/23 0601, 2 g at 04/27/23 0601   Shower Chin To Toes With 60 mL chlorhexidine (HIBICLENS) the night before surgery, , , Once **AND** [COMPLETED] Shower Chin To Toes With 60 mL chlorhexidine (HIBICLENS) in AM of surgery after pre-op clip completed, , , Once **AND** [COMPLETED] chlorhexidine (HIBICLENS) 4 % liquid 4 Application, 60 mL, Topical, Once, 4 Application at 04/27/23 0345 **AND** chlorhexidine (HIBICLENS) 4 % liquid 4 Application, 60 mL, Topical, Once, Pace, Brien R, NP   Chlorhexidine Gluconate Cloth 2 % PADS 6 each, 6 each, Topical, Daily, Schnier, Latina Craver, MD, 6 each at 04/26/23 0847   clopidogrel (PLAVIX) tablet 75 mg, 75 mg, Oral, Daily, Jamelle Rushing L, MD, 75 mg at 04/26/23 1500   ezetimibe (ZETIA) tablet 10 mg, 10 mg, Oral, Daily, Schnier, Latina Craver, MD, 10 mg at 04/26/23 0846   fentaNYL (DURAGESIC) 75 MCG/HR 1 patch, 1 patch, Transdermal, Q72H, Schnier, Latina Craver, MD, 1 patch at 04/26/23 1403   ibuprofen (ADVIL) tablet 200 mg, 200 mg, Oral, Q6H PRN, Schnier, Latina Craver, MD, 200 mg at 04/22/23 2254    linezolid (ZYVOX) IVPB 600 mg, 600 mg, Intravenous, Q12H, Schnier, Latina Craver, MD, Last Rate: 300 mL/hr at 04/26/23 2106, 600 mg at 04/26/23 2106   loperamide (IMODIUM) capsule 2 mg, 2 mg, Oral, BID PRN, Schnier, Latina Craver, MD   oxyCODONE (Oxy IR/ROXICODONE) immediate release tablet 5 mg, 5 mg, Oral, Q6H PRN, Schnier, Latina Craver, MD, 5 mg at 04/27/23 0332   pantoprazole (PROTONIX) EC tablet 40 mg, 40 mg, Oral, Daily, Schnier, Latina Craver, MD, 40 mg at 04/26/23 0846   phenylephrine ((USE for PREPARATION-H)) 0.25 % suppository 1 suppository, 1 suppository, Rectal, PRN, Schnier, Latina Craver, MD   pregabalin (LYRICA) capsule 150 mg, 150 mg, Oral, BID, Schnier, Latina Craver, MD, 150 mg at 04/26/23 2050   sodium chloride flush (NS) 0.9 % injection 10-40 mL, 10-40 mL, Intracatheter, Q12H, Schnier, Latina Craver, MD, 10 mL at 04/26/23 2107   sodium chloride flush (NS) 0.9 % injection 10-40 mL, 10-40 mL, Intracatheter, PRN, Schnier, Latina Craver, MD   traZODone (DESYREL) tablet 25-50 mg, 25-50 mg, Oral, QHS PRN, Schnier, Latina Craver, MD, 50 mg at 04/26/23 2050    ALLERGIES   Patient has no known allergies.     REVIEW OF SYSTEMS    Review of Systems:  Gen:  Denies  fever, sweats, chills weigh loss  HEENT: Denies blurred vision, double vision, ear pain, eye pain, hearing loss, nose bleeds, sore throat Cardiac:  No dizziness, chest pain or heaviness, chest tightness,edema Resp:   reports dyspnea chronically  Gi: Denies swallowing difficulty, stomach pain, nausea or vomiting, diarrhea, constipation, bowel incontinence Gu:  Denies bladder incontinence, burning urine Ext:   Denies Joint pain, stiffness or swelling Skin: Denies  skin rash, easy bruising or bleeding or hives Endoc:  Denies polyuria, polydipsia , polyphagia or weight change Psych:   Denies depression, insomnia or hallucinations   Other:  All other systems negative   VS: BP 124/61 (BP Location: Right Wrist)   Pulse 83   Temp 97.8 F (36.6 C)  (Oral)   Resp 16   Ht 5' 2.99" (1.6 m)   Wt 71.2 kg   SpO2 97%   BMI 27.81 kg/m      PHYSICAL EXAM    GENERAL:NAD, no fevers, chills, no weakness no fatigue  HEAD: Normocephalic, atraumatic.  EYES: Pupils equal, round, reactive to light. Extraocular muscles intact. No scleral icterus.  MOUTH: Moist mucosal membrane. Dentition intact. No abscess noted.  EAR, NOSE, THROAT: Clear without exudates. No external lesions.  NECK: Supple. No thyromegaly. No nodules. No JVD.  PULMONARY: decreased breath sounds with mild rhonchi worse at bases bilaterally.  CARDIOVASCULAR: S1 and S2. Regular rate and rhythm. No murmurs, rubs, or gallops. No edema. Pedal pulses 2+ bilaterally.  GASTROINTESTINAL: Soft, nontender, nondistended. No masses. Positive bowel sounds. No hepatosplenomegaly.  MUSCULOSKELETAL: No swelling, clubbing, or edema. Range of motion full in all extremities.  NEUROLOGIC: Cranial nerves II through XII are intact. No gross focal neurological deficits. Sensation intact. Reflexes intact.  SKIN: No ulceration, lesions, rashes, or cyanosis. Skin warm and dry. Turgor intact.  PSYCHIATRIC: Mood, affect within normal limits. The patient is awake, alert and oriented x 3. Insight, judgment intact.       IMAGING     ASSESSMENT/PLAN   Complete atelectasis of left lung   - patient s/p diuresis with partial improvement however still has minimal lung sounds on left.   - she does have CKD 3 and may not produce adequate fluid removal via medication alone   - we discussed options for thoracentesis and patient would like to proceed with this procedure.   - IR consultation placed for therapeutic tap-amber colored left pleural fluid 1.6L -incentive spirometry at bedside to be used multiple times daily  - PT/OT - etiology is likely poor nutrition post ICU hospitalization with CHF and CKD all contributing to third spaced interstitial edema , peripheral edema and effusion      Bilateral  pleural effusions    - s/p throacentesis Sept 2024   - consultation for IR with US guided throacentesis - therapeutic    -patient on heparin drip -prealbumin -urine protein  Sepsis due to right groin cellulitis   S/p vascular surgery eval S/p vanco /rocephin  - patient appears to be clinically improved.  - s/p ID revaluation - appreciate input    Thank you for allowing me to participate in the care of this patient.   Patient/Family are satisfied with care plan and all questions have been answered.    Provider disclosure: Patient with at least one acute or chronic illness or injury that poses a threat to life or bodily function and is being managed actively during this encounter.  All of the below services have been performed independently by signing provider:  review of prior documentation from internal and or external health records.  Review of previous and current lab results.  Interview and comprehensive assessment during patient visit today. Review of current and previous chest radiographs/CT scans. Discussion of management and test interpretation with health care team and patient/family.   This document was prepared using Dragon voice recognition software and may include unintentional dictation errors.     Vida Rigger, M.D.  Division of Pulmonary & Critical Care Medicine

## 2023-04-27 NOTE — Progress Notes (Signed)
Pt was in OR So could not see her Continue IV cefepime and IV linezolid for infected wound groin - surgical site infection Surgical cultures pending Superficial culture from ED - morganella, ecoli and proteus  [facility] on call this weekend available by phone for urgent issue

## 2023-04-28 ENCOUNTER — Encounter: Payer: Self-pay | Admitting: Vascular Surgery

## 2023-04-28 LAB — CBC
HCT: 29.1 % — ABNORMAL LOW (ref 36.0–46.0)
Hemoglobin: 9.2 g/dL — ABNORMAL LOW (ref 12.0–15.0)
MCH: 28 pg (ref 26.0–34.0)
MCHC: 31.6 g/dL (ref 30.0–36.0)
MCV: 88.7 fL (ref 80.0–100.0)
Platelets: 264 10*3/uL (ref 150–400)
RBC: 3.28 MIL/uL — ABNORMAL LOW (ref 3.87–5.11)
RDW: 16.4 % — ABNORMAL HIGH (ref 11.5–15.5)
WBC: 11 10*3/uL — ABNORMAL HIGH (ref 4.0–10.5)
nRBC: 0 % (ref 0.0–0.2)

## 2023-04-28 LAB — CULTURE, BLOOD (ROUTINE X 2): Culture: NO GROWTH

## 2023-04-28 LAB — GLUCOSE, CAPILLARY: Glucose-Capillary: 98 mg/dL (ref 70–99)

## 2023-04-28 MED ORDER — ISOSORBIDE MONONITRATE ER 30 MG PO TB24
60.0000 mg | ORAL_TABLET | Freq: Every day | ORAL | Status: DC
  Administered 2023-04-28 – 2023-05-02 (×5): 60 mg via ORAL
  Filled 2023-04-28 (×5): qty 2

## 2023-04-28 MED ORDER — CARVEDILOL 12.5 MG PO TABS
12.5000 mg | ORAL_TABLET | Freq: Two times a day (BID) | ORAL | Status: DC
  Administered 2023-04-28 – 2023-04-30 (×5): 12.5 mg via ORAL
  Filled 2023-04-28 (×4): qty 1

## 2023-04-28 NOTE — Progress Notes (Signed)
[facility]  - [place] at [facility]   PATIENT NAME: Dawn Bruce    MR#:  [id number]  DATE OF BIRTH:  04/07/1946  SUBJECTIVE:  patient seen earlier. Husband at bedside. Overall doing better. Underwent right groin when exploration yesterday by Dr. Gilda Bruce and wound VAC placement. Eager to work with physical therapy. No fever.    VITALS:  Blood pressure 129/64, pulse 81, temperature 97.8 F (36.6 C), temperature source Oral, resp. rate 16, height 5' 2.99" (1.6 m), weight 75.2 kg, SpO2 99%.  PHYSICAL EXAMINATION:   GENERAL:  77 y.o.-year-old patient with no acute distress.  LUNGS: Normal breath sounds bilaterally, no wheezing CARDIOVASCULAR: S1, S2 normal. No murmur   ABDOMEN: Soft, nontender, nondistended. Bowel sounds present.  EXTREMITIES: right groin wound VAC NEUROLOGIC: nonfocal  patient is alert and awake  LABORATORY PANEL:  CBC Recent Labs  Lab 04/28/23 0610  WBC 11.0*  HGB 9.2*  HCT 29.1*  PLT 264    Chemistries  Recent Labs  Lab 04/22/23 1540 04/23/23 0631 04/26/23 0216  NA  --    < > 136  K  --    < > 3.4*  CL  --    < > 106  CO2  --    < > 24  GLUCOSE  --    < > 111*  BUN  --    < > 20  CREATININE  --    < > 1.08*  CALCIUM  --    < > 8.0*  MG 2.3  --   --   AST  --   --  27  ALT  --   --  28  ALKPHOS  --   --  44  BILITOT  --   --  0.9   < > = values in this interval not displayed.    Assessment and Plan Dawn Bruce is a 77 y.o. female with medical history significant of dCHF, CAD (s/p of recent DES), A fib on Eliquis, HTN, HLD, CDK-3a, chronic pain syndrome, multiple skin cancer, perforated appendicitis, former smoker, who presents with right groin pain.   Patient was recently hospitalized from 11/21 - 11/29 due to non-STEMI.  Patient is s/p of LHC with DES placement. Pt developed retroperitoneal hematoma & hemorrhagic shock due to complication of Iliac artery laceration during left heart cath of right groin. Pt underwent  surgical repair of external iliac artery by [facility] on 11/23.    Presented back to ED 12/1 due to increased redness, swelling, and pain at incision site of the cath. Failed outpatient Abx x1 dose.   CT-abd/pelvis: Moderate sized right retroperitoneal hematoma is noted which extends into right pelvic region and displaces urinary bladder to the left. The pelvic component appears to be mildly enlarged compared to prior exam. Bilateral pleural effusions are noted, left greater than right, with associated atelectasis of the lower lobes. Likely from her HF.  Sepsis due to cellulitis of right groin: sepsis criteria met on admission with WBC 21.3, heart rate 104, RR 29.  Lactic acid normal 1.7.  infection source is superficial groin. --Dr. Myra Bruce of [facility] removed several staples, probed and packed wound of right groin and she was started on IV Abx in ED.  -- blood culture NGTD. -- Wound cultures E. coli, Proteus, Morganella. Sensitivities returned. - ID Dr. Joylene Bruce following recommends continue--- linezolid + cefepime -- Debridement with wound vac placement 12/4. -- Status post additional wash out 12/6 by Dr. Gilda Bruce - PT recommending [facility] -  analgesia PRN -- sepsis improved   Hypervolemia  Bilateral Pleural effusions acute on chronic systolic congestive heart failure with known EF of 25 to 30% on echo 11/24 -- L worse than R. Denies respiratory symptoms. Had recent echo showing HFrEF. -- Status post left thoracentesis 9/22. -- Status post thoracentesis 12/3 L chest 1.6L removed. - pulmonology consult appreciated -- sats 99% on room air --?consider low dose diuretic at d/c   Hx of CAD: s/p of recent DES. --restarted plavix 12/5. Was not held prior to second debridement   Peritoneal hematoma: Hgb mild drop 10.3>>>8.8>7.6.Blood loss from wound is minimal.  - transfuse 1 unit pRBC 12/5 and hgb now stable above 9 - closely monitor hematoma - transfusion threshold is <8   Essential hypertension:  Blood pressure stable -Continue Coreg which is also for A-fib - holding other home meds for transient hypotension    Hypokalemia: Potassium 3.3>3.4.  Magnesium 2.3, phosphorus 2.7 -Repleted potassium   Chronic kidney disease, stage 3a (HCC): Stable. Cr 1.02>1.14    Abnormal LFTs: her liver function has been improving.     Atrial fibrillation, chronic (HCC): rate controlled -Switch back to eliquis -Continue Coreg   Diarrhea: C. difficile negative. -As needed Imodium   Chronic pain syndrome -Continue home fentanyl patch and Lyrica   HLD: -lipitor and zetia     VTE ppx: apixaban (ELIQUIS) tablet 2.5 mg  Procedures: right groin wound VAC and wound debridement Family communication : husband Consults : vascular pulmonary, ID CODE STATUS: full DVT Prophylaxis : eliquis Level of care: Med-Surg Status is: Inpatient Remains inpatient appropriate because: overall improving. Hopefully discharge next week    TOTAL TIME TAKING CARE OF THIS PATIENT: 35 minutes.  >50% time spent on counselling and coordination of care  Note: This dictation was prepared with Dragon dictation along with smaller phrase technology. Any transcriptional errors that result from this process are unintentional.  Dawn Bruce M.D    [facility]   CC: Primary care physician; Dawn Downer, MD

## 2023-04-28 NOTE — Plan of Care (Signed)

## 2023-04-28 NOTE — Progress Notes (Signed)
Physical Therapy Treatment Patient Details Name: Dawn Bruce MRN: [id number] DOB: 03/14/1946 Today's Date: 04/28/2023   History of Present Illness Pt is a 77 years old admitted 04/22/23 for cellulitis of right groin. Procedure dated 04/25/23 for (1) excisional debridement of right groin and (2) application of VAC wound dressing.    PT Comments  Pt was long sitting in bed in bed with supportive daughter and spouse present. Pt is very pleasant and motivated. Endorses 4/10 pain but pain did not limit session progression. She was able to exit bed, stand, and ambulate with RW ~ 120 ft. Fair amount of drainage from wound vac noted. She tolerated session well and is progressing towards PLOF. DC recs remain appropriate. Pt is being referred to mobility tech team to increase amount of OOB activity.    If plan is discharge home, recommend the following: A little help with walking and/or transfers;A little help with bathing/dressing/bathroom;Help with stairs or ramp for entrance;Assist for transportation     Equipment Recommendations  None recommended by PT (pt endorses having all equipment needs met)       Precautions / Restrictions Precautions Precautions: Fall Precaution Comments:  (wound vac R groin) Restrictions Weight Bearing Restrictions: No RLE Weight Bearing: Weight bearing as tolerated     Mobility  Bed Mobility Overal bed mobility: Needs Assistance Bed Mobility: Supine to Sit, Sit to Supine  Supine to sit: Contact guard Sit to supine: Contact guard assist   Transfers Overall transfer level: Needs assistance Equipment used: Rolling walker (2 wheels) Transfers: Sit to/from Stand Sit to Stand: Supervision  General transfer comment: no physical assistance to stand from minimally elevated bed height    Ambulation/Gait Ambulation/Gait assistance: Contact guard assist, Supervision Gait Distance (Feet): 120 Feet Assistive device: Rolling walker (2 wheels) Gait Pattern/deviations:  Step-through pattern, Antalgic Gait velocity: decreased  General Gait Details: pt tolerated ambulation well without LOB or sfaety concern. daughter requested pt get up more often. will refer to mobility team for increased OOB activity       Balance Overall balance assessment: Needs assistance Sitting-balance support: Feet supported Sitting balance-Leahy Scale: Good     Standing balance support: Bilateral upper extremity supported, During functional activity Standing balance-Leahy Scale: Good Standing balance comment: no LOB or unsteadiness with use of RW         Cognition Arousal: Alert Behavior During Therapy: WFL for tasks assessed/performed Overall Cognitive Status: Within Functional Limits for tasks assessed    General Comments: alert and oriented x4. supportive spouse and daughter at bedside               Pertinent Vitals/Pain Pain Assessment Pain Assessment: 0-10 Pain Score: 4  Pain Location: R groin region Pain Descriptors / Indicators: Aching, Constant Pain Intervention(s): Limited activity within patient's tolerance, Monitored during session, Premedicated before session, Repositioned     PT Goals (current goals can now be found in the care plan section) Acute Rehab PT Goals Patient Stated Goal: to go home Progress towards PT goals: Progressing toward goals    Frequency    Min 1X/week       AM-PAC PT "6 Clicks" Mobility   Outcome Measure  Help needed turning from your back to your side while in a flat bed without using bedrails?: A Little Help needed moving from lying on your back to sitting on the side of a flat bed without using bedrails?: A Little Help needed moving to and from a bed to a chair (including a wheelchair)?: A Little  Help needed standing up from a chair using your arms (e.g., wheelchair or bedside chair)?: A Little Help needed to walk in hospital room?: A Little Help needed climbing 3-5 steps with a railing? : A Little 6 Click  Score: 18    End of Session   Activity Tolerance: Patient tolerated treatment well Patient left: in bed;with call bell/phone within reach;with bed alarm set;with family/visitor present Nurse Communication: Mobility status PT Visit Diagnosis: Unsteadiness on feet (R26.81);Other abnormalities of gait and mobility (R26.89);Muscle weakness (generalized) (M62.81);Difficulty in walking, not elsewhere classified (R26.2);Pain Pain - Right/Left: Right Pain - part of body: Leg     Time: 1346-1405 PT Time Calculation (min) (ACUTE ONLY): 19 min  Charges:    $Gait Training: 8-22 mins PT General Charges $$ ACUTE PT VISIT: 1 Visit                     Jetta Lout PTA 04/28/23, 2:23 PM

## 2023-04-28 NOTE — TOC Progression Note (Signed)
Transition of Care [facility]) - Progression Note    Patient Details  Name: Dawn Bruce MRN: [id number] Date of Birth: [birth year]  Transition of Care [facility]) CM/SW Contact  Liliana Cline, LCSW Phone Number: 04/28/2023, 10:54 AM  Clinical Narrative:    Requested MDs keep TOC updated on if/when to order wound vac for home.        Expected Discharge Plan and Services                                               Social Determinants of Health (SDOH) Interventions SDOH Screenings   Food Insecurity: No Food Insecurity (04/23/2023)  Housing: Low Risk  (04/23/2023)  Transportation Needs: No Transportation Needs (04/23/2023)  Utilities: Not At Risk (04/23/2023)  Alcohol Screen: Low Risk  (12/08/2022)  Depression (PHQ2-9): Low Risk  (12/08/2022)  Financial Resource Strain: Low Risk  (10/13/2022)  Physical Activity: Insufficiently Active (10/13/2022)  Social Connections: Unknown (10/13/2022)  Stress: No Stress Concern Present (10/13/2022)  Tobacco Use: Medium Risk (04/27/2023)    Readmission Risk Interventions     No data to display

## 2023-04-28 NOTE — Progress Notes (Signed)
Postprocedural day #1 from excisional debridement right groin wound with VAC placement once again:  Operative findings demonstrated improved granulation tissue with a diminishment of slough.  Will continue with VAC over the weekend, likely another VAC change next week.  Hemodynamically stable currently.

## 2023-04-29 ENCOUNTER — Inpatient Hospital Stay: Payer: Medicare Other

## 2023-04-29 DIAGNOSIS — L03314 Cellulitis of groin: Secondary | ICD-10-CM | POA: Diagnosis not present

## 2023-04-29 LAB — GLUCOSE, CAPILLARY
Glucose-Capillary: 108 mg/dL — ABNORMAL HIGH (ref 70–99)
Glucose-Capillary: 101 mg/dL — ABNORMAL HIGH (ref 70–99)

## 2023-04-29 NOTE — TOC Progression Note (Addendum)
Transition of Care [facility]) - Progression Note    Patient Details  Name: Dawn Bruce MRN: [id number] Date of Birth: July 30, 1945  Transition of Care [facility]) CM/SW Contact  Liliana Cline, LCSW Phone Number: 04/29/2023, 9:04 AM  Clinical Narrative:    Notified by MD, plan to DC patient Tuesday. Surgeon confirmed patient will need a wound vac for home. CSW left a VM for Rayle at [facility].  9:45- Made referral to [facility] with [facility].       Expected Discharge Plan and Services                                               Social Determinants of Health (SDOH) Interventions SDOH Screenings   Food Insecurity: No Food Insecurity (04/23/2023)  Housing: Low Risk  (04/23/2023)  Transportation Needs: No Transportation Needs (04/23/2023)  Utilities: Not At Risk (04/23/2023)  Alcohol Screen: Low Risk  (12/08/2022)  Depression (PHQ2-9): Low Risk  (12/08/2022)  Financial Resource Strain: Low Risk  (10/13/2022)  Physical Activity: Insufficiently Active (10/13/2022)  Social Connections: Unknown (10/13/2022)  Stress: No Stress Concern Present (10/13/2022)  Tobacco Use: Medium Risk (04/27/2023)    Readmission Risk Interventions     No data to display

## 2023-04-29 NOTE — Progress Notes (Signed)
[facility]  - [place] [facility]   PATIENT NAME: Dawn Bruce    MR#:  [id number]  DATE OF BIRTH:  12-15-1945  SUBJECTIVE:  patient seen earlier. Husband at bedside. Overall doing better. Underwent right groin when exploration on Friday  by Dr. Gilda Crease and wound VAC placement.  Ambulates well with physical therapist. Will have mobility therapist see her on daily basis.  VITALS:  Blood pressure (!) 111/48, pulse 83, temperature 98 F (36.7 C), resp. rate 18, height 5' 2.99" (1.6 m), weight 72.5 kg, SpO2 96%.  PHYSICAL EXAMINATION:   GENERAL:  77 y.o.-year-old patient with no acute distress.  LUNGS: Normal breath sounds bilaterally, no wheezing CARDIOVASCULAR: S1, S2 normal. No murmur   ABDOMEN: Soft, nontender, nondistended. Bowel sounds present.  EXTREMITIES: right groin wound VAC NEUROLOGIC: nonfocal  patient is alert and awake  LABORATORY PANEL:  CBC Recent Labs  Lab 04/28/23 0610  WBC 11.0*  HGB 9.2*  HCT 29.1*  PLT 264    Chemistries  Recent Labs  Lab 04/22/23 1540 04/23/23 0631 04/26/23 0216  NA  --    < > 136  K  --    < > 3.4*  CL  --    < > 106  CO2  --    < > 24  GLUCOSE  --    < > 111*  BUN  --    < > 20  CREATININE  --    < > 1.08*  CALCIUM  --    < > 8.0*  MG 2.3  --   --   AST  --   --  27  ALT  --   --  28  ALKPHOS  --   --  44  BILITOT  --   --  0.9   < > = values in this interval not displayed.    Assessment and Plan [name] is a 77 y.o. female with medical history significant of dCHF, CAD (s/p of recent DES), A fib on Eliquis, HTN, HLD, CDK-3a, chronic pain syndrome, multiple skin cancer, perforated appendicitis, former smoker, who presents with right groin pain.   Patient was recently hospitalized from 11/21 - 11/29 due to non-STEMI.  Patient is s/p of LHC with DES placement. Pt developed retroperitoneal hematoma & hemorrhagic shock due to complication of Iliac artery laceration during left heart cath of right  groin. Pt underwent surgical repair of external iliac artery by [facility] on 11/23.    Presented back to ED 12/1 due to increased redness, swelling, and pain at incision site of the cath. Failed outpatient Abx x1 dose.   CT-abd/pelvis: Moderate sized right retroperitoneal hematoma is noted which extends into right pelvic region and displaces urinary bladder to the left. The pelvic component appears to be mildly enlarged compared to prior exam. Bilateral pleural effusions are noted, left greater than right, with associated atelectasis of the lower lobes. Likely from her HF.  Sepsis due to cellulitis of right groin: sepsis criteria met on admission with WBC 21.3, heart rate 104, RR 29.  Lactic acid normal 1.7.  infection source is superficial groin. --Dr. Myra Gianotti of [facility] removed several staples, probed and packed wound of right groin and she was started on IV Abx in ED.  -- blood culture NGTD. -- Wound cultures E. coli, Proteus, Morganella. Sensitivities returned. - ID Dr. Joylene Draft following recommends continue--- linezolid + cefepime-- await ID to de-escalate antibiotics -- Debridement with wound vac placement 12/4. -- Status post additional  wash out 12/6 by Dr. Gilda Crease - PT recommending [facility] - analgesia PRN -- sepsis improved -- discussed with Dr. Gilda Crease to see when patient can be discharged. Per husband Dr. Gilda Crease mentioned about taking patient to the OR on Tuesday to take a look at right groin wound again.   Hypervolemia  Bilateral Pleural effusions acute on chronic systolic congestive heart failure with known EF of 25 to 30% on echo 11/24 -- L worse than R. Denies respiratory symptoms. Had recent echo showing HFrEF. -- Status post left thoracentesis 9/22. -- Status post thoracentesis 12/3 L chest 1.6L removed. - pulmonology consult appreciated -- sats 99% on room air --?consider low dose diuretic at d/c   Hx of CAD: s/p of recent DES. --restarted plavix 12/5. Was not held prior to  second debridement   Peritoneal hematoma: Hgb mild drop 10.3>>>8.8>7.6.Blood loss from wound is minimal.  - transfuse 1 unit pRBC 12/5 and hgb now stable above 9 - closely monitor hematoma - transfusion threshold is <8   Essential hypertension: Blood pressure stable -Continue Coreg which is also for A-fib - holding other home meds for transient hypotension    Hypokalemia: Potassium 3.3>3.4.  Magnesium 2.3, phosphorus 2.7 -Repleted potassium   Chronic kidney disease, stage 3a (HCC): Stable. Cr 1.02>1.14    Abnormal LFTs: her liver function has been improving.     Atrial fibrillation, chronic (HCC): rate controlled -Switch back to eliquis -Continue Coreg   Diarrhea: C. difficile negative. -As needed Imodium   Chronic pain syndrome -Continue home fentanyl patch and Lyrica   HLD: -lipitor and zetia  Patient to ambulate with mobility therapist  TOC has ordered wound VAC for home     VTE ppx: apixaban (ELIQUIS) tablet 2.5 mg  Procedures: right groin wound VAC and wound debridement Family communication : husband Consults : vascular pulmonary, ID CODE STATUS: full DVT Prophylaxis : eliquis Level of care: Med-Surg Status is: Inpatient Remains inpatient appropriate because: overall improving. Hopefully discharge next week    TOTAL TIME TAKING CARE OF THIS PATIENT: 35 minutes.  >50% time spent on counselling and coordination of care  Note: This dictation was prepared with Dragon dictation along with smaller phrase technology. Any transcriptional errors that result from this process are unintentional.  Enedina Finner M.D    [facility]   CC: Primary care physician; Dawn Downer, MD

## 2023-04-30 DIAGNOSIS — K661 Hemoperitoneum: Secondary | ICD-10-CM | POA: Diagnosis not present

## 2023-04-30 DIAGNOSIS — I482 Chronic atrial fibrillation, unspecified: Secondary | ICD-10-CM | POA: Diagnosis not present

## 2023-04-30 DIAGNOSIS — I509 Heart failure, unspecified: Secondary | ICD-10-CM | POA: Diagnosis not present

## 2023-04-30 DIAGNOSIS — I2510 Atherosclerotic heart disease of native coronary artery without angina pectoris: Secondary | ICD-10-CM | POA: Diagnosis not present

## 2023-04-30 DIAGNOSIS — T8130XA Disruption of wound, unspecified, initial encounter: Secondary | ICD-10-CM | POA: Diagnosis not present

## 2023-04-30 DIAGNOSIS — L089 Local infection of the skin and subcutaneous tissue, unspecified: Secondary | ICD-10-CM | POA: Diagnosis not present

## 2023-04-30 DIAGNOSIS — L03314 Cellulitis of groin: Secondary | ICD-10-CM | POA: Diagnosis not present

## 2023-04-30 LAB — COMPREHENSIVE METABOLIC PANEL
ALT: 25 U/L (ref 0–44)
AST: 24 U/L (ref 15–41)
Albumin: 2.1 g/dL — ABNORMAL LOW (ref 3.5–5.0)
Alkaline Phosphatase: 59 U/L (ref 38–126)
Anion gap: 9 (ref 5–15)
BUN: 38 mg/dL — ABNORMAL HIGH (ref 8–23)
CO2: 18 mmol/L — ABNORMAL LOW (ref 22–32)
Calcium: 8.4 mg/dL — ABNORMAL LOW (ref 8.9–10.3)
Chloride: 110 mmol/L (ref 98–111)
Creatinine, Ser: 1.66 mg/dL — ABNORMAL HIGH (ref 0.44–1.00)
GFR, Estimated: 31 mL/min — ABNORMAL LOW (ref >60)
Glucose, Bld: 110 mg/dL — ABNORMAL HIGH (ref 70–99)
Potassium: 4.1 mmol/L (ref 3.5–5.1)
Sodium: 137 mmol/L (ref 135–145)
Total Bilirubin: 0.9 mg/dL (ref <1.2)
Total Protein: 4.8 g/dL — ABNORMAL LOW (ref 6.5–8.1)

## 2023-04-30 LAB — C-REACTIVE PROTEIN: CRP: 1.8 mg/dL — ABNORMAL HIGH (ref <1.0)

## 2023-04-30 MED ORDER — FUROSEMIDE 10 MG/ML IJ SOLN
40.0000 mg | Freq: Every day | INTRAMUSCULAR | Status: DC
  Administered 2023-04-30 – 2023-05-01 (×2): 40 mg via INTRAVENOUS
  Filled 2023-04-30 (×2): qty 4

## 2023-04-30 MED ORDER — SPIRONOLACTONE 25 MG PO TABS
50.0000 mg | ORAL_TABLET | Freq: Every day | ORAL | Status: DC
  Administered 2023-04-30 – 2023-05-01 (×2): 50 mg via ORAL
  Filled 2023-04-30 (×2): qty 2

## 2023-04-30 MED ORDER — PIPERACILLIN-TAZOBACTAM 3.375 G IVPB
3.3750 g | Freq: Three times a day (TID) | INTRAVENOUS | Status: DC
  Administered 2023-04-30 – 2023-05-02 (×5): 3.375 g via INTRAVENOUS
  Filled 2023-04-30 (×5): qty 50

## 2023-04-30 MED ORDER — ALBUMIN HUMAN 25 % IV SOLN
12.5000 g | Freq: Two times a day (BID) | INTRAVENOUS | Status: AC
  Administered 2023-04-30 – 2023-05-01 (×3): 12.5 g via INTRAVENOUS
  Filled 2023-04-30 (×3): qty 50

## 2023-04-30 MED ORDER — TRAZODONE HCL 50 MG PO TABS
50.0000 mg | ORAL_TABLET | Freq: Every day | ORAL | Status: DC
Start: 2023-04-30 — End: 2023-05-13
  Administered 2023-04-30 – 2023-05-12 (×13): 50 mg via ORAL
  Filled 2023-04-30 (×13): qty 1

## 2023-04-30 NOTE — H&P (View-Only) (Signed)
Progress Note    04/30/2023 11:46 AM 3 Days Post-Op  Subjective:   Dawn Bruce is a 77 yo female who presents to [facility] emergency room with post operative right groin infection.  Patient is now postop day 3 from right groin incision and drainage with wound washout and wound VAC placement for the second time.  Patient is resting comfortably in bed this afternoon.  Wound VAC is in place and working well.  Noted serosanguineous drainage to canister and the about 10 mL, but was just changed out by nursing. Patient does endorse some pain and soreness to her right groin but states it is manageable. Patient endorses some shortness of breath and that she has a re-accumulation of fluid in her lungs. Waiting to see Pulmonology to see if she needs the fluid drained again. Husband is at the bedside. No other complaints overnight vitals are remained stable.    Vitals:   04/30/23 0210 04/30/23 0952  BP: (!) 158/79 (!) 187/78  Pulse: 94 95  Resp: 20 18  Temp: (!) 97.4 F (36.3 C) 97.6 F (36.4 C)  SpO2: 95% 100%   Physical Exam: Cardiac:  Hx of Atrial Fibrillation. Regular Rate  Lungs: Bilateral lungs with diminished breath sounds 1/2 way. Mild Dyspnea with couch. No wheezing noted. Labored breathing this morning. No oxygen required.  Incisions:  Right groin with wound VAC in place and working well.  Maintain suction and noted 10 mL serosanguineous drainage now. Cannister was just changed by nursing. Extremities:  Bilateral lower extremities warm to touch. Palpable pulses. Noted +1 edema to right lower extremity.  Abdomen:  Positive bowel sounds throughout, soft and non tender and non distended.  Neurologic: AAOX4, Follows commands.   CBC    Component Value Date/Time   WBC 11.0 (H) 04/28/2023 0610   RBC 3.28 (L) 04/28/2023 0610   HGB 9.2 (L) 04/28/2023 0610   HGB 13.6 06/01/2022 0000   HCT 29.1 (L) 04/28/2023 0610   HCT 40.5 06/01/2022 0000   PLT 264 04/28/2023 0610   PLT 132 (L) 06/01/2022  0000   MCV 88.7 04/28/2023 0610   MCV 90 06/01/2022 0000   MCH 28.0 04/28/2023 0610   MCHC 31.6 04/28/2023 0610   RDW 16.4 (H) 04/28/2023 0610   RDW 12.8 06/01/2022 0000   LYMPHSABS 0.7 04/22/2023 1528   LYMPHSABS 0.7 08/30/2021 0917   MONOABS 1.2 (H) 04/22/2023 1528   EOSABS 0.2 04/22/2023 1528   EOSABS 0.0 08/30/2021 0917   BASOSABS 0.0 04/22/2023 1528   BASOSABS 0.0 08/30/2021 0917    BMET    Component Value Date/Time   NA 136 04/26/2023 0216   NA 147 (H) 06/01/2022 0000   K 3.4 (L) 04/26/2023 0216   CL 106 04/26/2023 0216   CO2 24 04/26/2023 0216   GLUCOSE 111 (H) 04/26/2023 0216   BUN 20 04/26/2023 0216   BUN 23 06/01/2022 0000   CREATININE 1.08 (H) 04/26/2023 0216   CALCIUM 8.0 (L) 04/26/2023 0216   GFRNONAA 52 (L) 04/26/2023 0216   GFRAA 54 (L) 10/08/2019 1419    INR    Component Value Date/Time   INR 1.5 (H) 04/22/2023 1540     Intake/Output Summary (Last 24 hours) at 04/30/2023 1146 Last data filed at 04/30/2023 1145 Gross per 24 hour  Intake 566.86 ml  Output --  Net 566.86 ml     Assessment/Plan:  77 y.o. female is s/p Right groin wound washout with wound vac change out.  3 Days Post-Op  PLAN: Vascular surgery plans on taking the patient back to the operating room on 05/01/2023 for another wound washout with wound VAC exchange.  Patient is currently waiting to see pulmonology about a possible thoracentesis prior to going back to the OR.  However I did discuss with the patient and her husband at the bedside this morning in detail the procedure, benefits, risks, and complications.  Both verbalized her understanding and wished to proceed.  I answered all her questions.  Patient will be made n.p.o. after midnight tonight for procedure tomorrow.  I discussed the plan in detail with Dr. [name] MD and he agrees with the plan.  DVT prophylaxis: Heparin infusion.   Dawn Bruce [facility] 04/30/2023 11:46 AM

## 2023-04-30 NOTE — TOC Progression Note (Signed)
Transition of Care [facility]) - Progression Note    Patient Details  Name: Dawn Bruce MRN: [id number] Date of Birth: 12-13-1945  [facility]) CM/SW Contact  Chapman Fitch, RN Phone Number: 04/30/2023, 12:24 PM  Clinical Narrative:      Sherron Monday with French Ana with [employer].  Orders for home vac have been sent to Dr Edrick Kins and she needs the depth of the wound  Update MD has signed and depth 4cm.  French Ana updated       Expected Discharge Plan and Services                                               Social Determinants of Health (SDOH) Interventions SDOH Screenings   Food Insecurity: No Food Insecurity (04/23/2023)  Housing: Low Risk  (04/23/2023)  Transportation Needs: No Transportation Needs (04/23/2023)  Utilities: Not At Risk (04/23/2023)  Alcohol Screen: Low Risk  (12/08/2022)  Depression (PHQ2-9): Low Risk  (12/08/2022)  Financial Resource Strain: Low Risk  (10/13/2022)  Physical Activity: Insufficiently Active (10/13/2022)  Social Connections: Unknown (10/13/2022)  Stress: No Stress Concern Present (10/13/2022)  Tobacco Use: Medium Risk (04/27/2023)    Readmission Risk Interventions     No data to display

## 2023-04-30 NOTE — Progress Notes (Signed)
Progress Note    04/30/2023 11:46 AM 3 Days Post-Op  Subjective:   Dawn Bruce is a 77 yo female who presents to [facility] emergency room with post operative right groin infection.  Patient is now postop day 3 from right groin incision and drainage with wound washout and wound VAC placement for the second time.  Patient is resting comfortably in bed this afternoon.  Wound VAC is in place and working well.  Noted serosanguineous drainage to canister and the about 10 mL, but was just changed out by nursing. Patient does endorse some pain and soreness to her right groin but states it is manageable. Patient endorses some shortness of breath and that she has a re-accumulation of fluid in her lungs. Waiting to see Pulmonology to see if she needs the fluid drained again. Husband is at the bedside. No other complaints overnight vitals are remained stable.    Vitals:   04/30/23 0210 04/30/23 0952  BP: (!) 158/79 (!) 187/78  Pulse: 94 95  Resp: 20 18  Temp: (!) 97.4 F (36.3 C) 97.6 F (36.4 C)  SpO2: 95% 100%   Physical Exam: Cardiac:  Hx of Atrial Fibrillation. Regular Rate  Lungs: Bilateral lungs with diminished breath sounds 1/2 way. Mild Dyspnea with couch. No wheezing noted. Labored breathing this morning. No oxygen required.  Incisions:  Right groin with wound VAC in place and working well.  Maintain suction and noted 10 mL serosanguineous drainage now. Cannister was just changed by nursing. Extremities:  Bilateral lower extremities warm to touch. Palpable pulses. Noted +1 edema to right lower extremity.  Abdomen:  Positive bowel sounds throughout, soft and non tender and non distended.  Neurologic: AAOX4, Follows commands.   CBC    Component Value Date/Time   WBC 11.0 (H) 04/28/2023 0610   RBC 3.28 (L) 04/28/2023 0610   HGB 9.2 (L) 04/28/2023 0610   HGB 13.6 06/01/2022 0000   HCT 29.1 (L) 04/28/2023 0610   HCT 40.5 06/01/2022 0000   PLT 264 04/28/2023 0610   PLT 132 (L) 06/01/2022  0000   MCV 88.7 04/28/2023 0610   MCV 90 06/01/2022 0000   MCH 28.0 04/28/2023 0610   MCHC 31.6 04/28/2023 0610   RDW 16.4 (H) 04/28/2023 0610   RDW 12.8 06/01/2022 0000   LYMPHSABS 0.7 04/22/2023 1528   LYMPHSABS 0.7 08/30/2021 0917   MONOABS 1.2 (H) 04/22/2023 1528   EOSABS 0.2 04/22/2023 1528   EOSABS 0.0 08/30/2021 0917   BASOSABS 0.0 04/22/2023 1528   BASOSABS 0.0 08/30/2021 0917    BMET    Component Value Date/Time   NA 136 04/26/2023 0216   NA 147 (H) 06/01/2022 0000   K 3.4 (L) 04/26/2023 0216   CL 106 04/26/2023 0216   CO2 24 04/26/2023 0216   GLUCOSE 111 (H) 04/26/2023 0216   BUN 20 04/26/2023 0216   BUN 23 06/01/2022 0000   CREATININE 1.08 (H) 04/26/2023 0216   CALCIUM 8.0 (L) 04/26/2023 0216   GFRNONAA 52 (L) 04/26/2023 0216   GFRAA 54 (L) 10/08/2019 1419    INR    Component Value Date/Time   INR 1.5 (H) 04/22/2023 1540     Intake/Output Summary (Last 24 hours) at 04/30/2023 1146 Last data filed at 04/30/2023 1145 Gross per 24 hour  Intake 566.86 ml  Output --  Net 566.86 ml     Assessment/Plan:  77 y.o. female is s/p Right groin wound washout with wound vac change out.  3 Days Post-Op  PLAN: Vascular surgery plans on taking the patient back to the operating room on 05/01/2023 for another wound washout with wound VAC exchange.  Patient is currently waiting to see pulmonology about a possible thoracentesis prior to going back to the OR.  However I did discuss with the patient and her husband at the bedside this morning in detail the procedure, benefits, risks, and complications.  Both verbalized her understanding and wished to proceed.  I answered all her questions.  Patient will be made n.p.o. after midnight tonight for procedure tomorrow.  I discussed the plan in detail with Dr. [name] MD and he agrees with the plan.  DVT prophylaxis: Heparin infusion.   [name] [facility] 04/30/2023 11:46 AM

## 2023-04-30 NOTE — Consult Note (Signed)
Pharmacy Antibiotic Note  Dawn Bruce is a 77 y.o. female with medical history significant of dCHF, CAD (s/p of recent DES), A fib on Eliquis, HTN, HLD, CDK-3a, chronic pain syndrome, multiple skin cancer, perforated appendicitis, former smoker admitted on 04/22/2023 with post-operative  right groin infection . ID following.  Pharmacy has been consulted for cefepime dosing. Patient is also ordered linezolid.  Today, 04/30/2023 Day 8 linezolid and cefepime Renal: SCr 1.08 on 12/5 (stable), CrCl 40 ml/min WBC 11, overall it is improved Afebrile 12/2 groin wound culture: E coli, M. Morganii, P mirabilis 12/4 OR culture: Amp-susceptible E faecalis S/p I&D and hematoma drainage on 12/4  Plan: Continue with Cefepime 2 g IV q12h as per CrCl Follow renal function - need to re-check Follow most recent culture results ID following F/u ability to adjust antibiotics based on cultures (? Piperacillin/tazobactam)  Height: 5' 2.99" (160 cm) Weight: 71.4 kg (157 lb 6.5 oz) IBW/kg (Calculated) : 52.38  Temp (24hrs), Avg:97.6 F (36.4 C), Min:97.3 F (36.3 C), Max:98 F (36.7 C)  Recent Labs  Lab 04/24/23 0202 04/25/23 0453 04/26/23 0216 04/26/23 1743 04/27/23 0500 04/28/23 0610  WBC 14.5* 12.4* 10.2 11.0* 10.6* 11.0*  CREATININE 1.14*  --  1.08*  --   --   --     Estimated Creatinine Clearance: 40 mL/min (A) (by C-G formula based on SCr of 1.08 mg/dL (H)).    No Known Allergies  Antimicrobials this admission: Vancomycin 12/1 x 1 Ceftriaxone 12/1 >> 12/2 Cefepime 12/2 >>  Linezolid 12/2 >>   Dose adjustments this admission: N/A  Microbiology results: 12/1 BCx: NGTD 12/1 C diff: (-) 12/1 GIP: (-) 12/2 Wound culture: see above  Thank you for allowing pharmacy to be a part of this patient's care.  Juliette Alcide, PharmD, BCPS, BCIDP Work Cell: [phone] 04/30/2023 10:23 AM

## 2023-04-30 NOTE — Plan of Care (Signed)

## 2023-04-30 NOTE — Progress Notes (Signed)
PROGRESS NOTE  Dawn Bruce    DOB: 05-21-1946, 77 y.o.  ZOX:[id number]    Code Status: Full Code   DOA: 04/22/2023   LOS: 8  Brief hospital course  Dawn Bruce is a 77 y.o. female with medical history significant of dCHF, CAD (s/p of recent DES), A fib on Eliquis, HTN, HLD, CDK-3a, chronic pain syndrome, multiple skin cancer, perforated appendicitis, former smoker, who presents with right groin pain.   Patient was recently hospitalized from 11/21 - 11/29 due to non-STEMI.  Patient is s/p of LHC with DES placement. Pt developed retroperitoneal hematoma & hemorrhagic shock due to complication of Iliac artery laceration during left heart cath of right groin. Pt underwent surgical repair of external iliac artery by [facility] on 11/23.   Presented back to ED 12/1 due to increased redness, swelling, and pain at incision site of the cath. Failed outpatient Abx x1 dose.  Hypotensive to 80/50 on presentation.    ED Course: pt was found to have WBC 21.3, BNP 626, stable renal function, potassium 3.3, Mg 2.3, phosphorus 2.7, lactic acid of 1.7, C diff negative. Temperature normal, blood pressure 141/74, heart rate 104, RR 29, oxygen saturation 98% on room air.   Dr. Myra Gianotti of [facility] is consulted by EDP. WJX:[id number] development of moderate size left pleural effusion with probable underlying atelectasis or infiltrate. CT-abd/pelvis: Moderate sized right retroperitoneal hematoma is noted which extends into right pelvic region and displaces urinary bladder to the left. The pelvic component appears to be mildly enlarged compared to prior exam. Bilateral pleural effusions are noted, left greater than right, with associated atelectasis of the lower lobes. Likely from her HF.   12/2: pulmonology, and IR consulted to address pleural effusion. ID consulted to address abx course. Transitioned to linezolid. Vascular evaluated and recommended debridement. 12/3: Thoracentesis removed 1.6L left side 12/4:stable ORA,  afebrile. Wound clinically greatly improved. Debridement today. Plan to stop heparin gtt and return to eliquis + DAPT s/p procedure 12/6: additional debridement 04/30/23 - doing well with PT/OT and on IV Abx. Repeat debridement 12/10.   Assessment & Plan  Principal Problem:   Cellulitis of right groin Active Problems:   Sepsis (HCC)   CAD (coronary artery disease)   Peritoneal hematoma   Essential hypertension   Chronic diastolic heart failure (HCC)   Hypokalemia   Chronic kidney disease, stage 3a (HCC)   Pleural effusion   Abnormal LFTs   Atrial fibrillation, chronic (HCC)   Diarrhea   Chronic pain syndrome   HLD (hyperlipidemia)   HFrEF (heart failure with reduced ejection fraction) (HCC)   Severe sepsis (HCC)   Infected wound   Cellulitis of right thigh  Sepsis due to cellulitis of right groin: sepsis criteria met on admission with WBC 21.3, heart rate 104, RR 29.  Lactic acid normal 1.7.  infection source is superficial groin. WBC improving, she remains afebrile.[place] in place BxCx NGTD. Wound cultures have multiple species. Sensitivities returned. - ID following  - linezolid + cefepime - follow [facility]. Debridement with wound vac placement 12/4. Additional wash out 12/6. Planning another one 12/10. - PT recommending HH - analgesia PRN  Hypervolemia  Bilateral Pleural effusions- incidental findings- L worse than R. Denies respiratory symptoms. Had recent echo showing HFrEF. Stable ORA. Of note, had thoracentesis 9/22. Thoracentesis 12/3 L chest 1.6L removed. Stable ORA - pulmonology consulted, IR for thoracentesis.  - watch respiratory status closely - trial of IV lasix helped her edema.    Hx of CAD: s/p  of recent DES. - restarted plavix 12/5. Was not held prior to second debridement -close monitoring of chest pain development.    Peritoneal hematoma: Hgb mild drop 10.3>>>9.2.Blood loss from wound is minimal.  - transfused 1 unit pRBC 12/5 and hgb now stable above  9 - closely monitor hematoma -Follow-up CBC am - transfusion threshold is <8   Essential hypertension: Blood pressure stable -Continue Coreg which is also for A-fib - holding other home meds for transient hypotension   HFrEF- EF 25-30% on echo 11/24. Considerable edema of all extremities has greatly improved -Watch volume status closely s/p trial of IV diuretics   Hypokalemia: Potassium 3.3>3.4.  Magnesium 2.3, phosphorus 2.7 -Repleted potassium   Chronic kidney disease, stage 3a (HCC): Stable. Cr 1.02>1.14 -Follow-up with BMP am    Abnormal LFTs: her liver function has been improving.  She has ALP 80, AST 54, ALT 83, total bilirubin 2.8, which is better than recent liver function and have now normalized   Atrial fibrillation, chronic (HCC): rate controlled -Switch back to eliquis -Continue Coreg   Diarrhea: C. difficile negative. -As needed Imodium   Chronic pain syndrome -Continue home fentanyl patch and Lyrica   HLD: -lipitor and zetia  Body mass index is 27.89 kg/m.  VTE ppx: apixaban (ELIQUIS) tablet 2.5 mg Start: 04/26/23 2000heparin gttapixaban (ELIQUIS) tablet 2.5 mg   Diet:     Diet   Diet Heart Room service appropriate? Yes; Fluid consistency: Thin   Consultants: Vascular surgery ID Pulmonology IR  Subjective 04/30/23    Pt reports feeling unwell today. Had poor sleep last night. Complains of her care over the weekend. Having increased difficulty with breathing.    Objective   Vitals:   04/29/23 1651 04/29/23 2025 04/30/23 0210 04/30/23 0214  BP: 132/61 (!) 179/80 (!) 158/79   Pulse: 87 89 94   Resp: 18 20 20    Temp: 98 F (36.7 C) (!) 97.3 F (36.3 C) (!) 97.4 F (36.3 C)   TempSrc:      SpO2: 97% 95% 95%   Weight:    71.4 kg  Height:        Intake/Output Summary (Last 24 hours) at 04/30/2023 0735 Last data filed at 04/29/2023 1512 Gross per 24 hour  Intake 436.86 ml  Output --  Net 436.86 ml   Filed Weights   04/28/23 0500  04/29/23 0500 04/30/23 0214  Weight: 75.2 kg 72.5 kg 71.4 kg     Physical Exam:  General: awake, alert, NAD HEENT: atraumatic, clear conjunctiva, anicteric sclera, MMM, hearing grossly normal Respiratory: normal respiratory effort. Cardiovascular: quick capillary refill, normal S1/S2, RRR, no JVD, murmurs Gastrointestinal: soft, NT, ND Nervous: A&O x3. no gross focal neurologic deficits, normal speech Extremities: 1+ pitting edema of all extremities Skin: R groin- wound vac in place and effective. No erythema or drainage appreciated.  Psychiatry: normal mood, congruent affect  Labs   I have personally reviewed the following labs and imaging studies CBC    Component Value Date/Time   WBC 11.0 (H) 04/28/2023 0610   RBC 3.28 (L) 04/28/2023 0610   HGB 9.2 (L) 04/28/2023 0610   HGB 13.6 06/01/2022 0000   HCT 29.1 (L) 04/28/2023 0610   HCT 40.5 06/01/2022 0000   PLT 264 04/28/2023 0610   PLT 132 (L) 06/01/2022 0000   MCV 88.7 04/28/2023 0610   MCV 90 06/01/2022 0000   MCH 28.0 04/28/2023 0610   MCHC 31.6 04/28/2023 0610   RDW 16.4 (H) 04/28/2023 0610  RDW 12.8 06/01/2022 0000   LYMPHSABS 0.7 04/22/2023 1528   LYMPHSABS 0.7 08/30/2021 0917   MONOABS 1.2 (H) 04/22/2023 1528   EOSABS 0.2 04/22/2023 1528   EOSABS 0.0 08/30/2021 0917   BASOSABS 0.0 04/22/2023 1528   BASOSABS 0.0 08/30/2021 0917      Latest Ref Rng & Units 04/26/2023    2:16 AM 04/24/2023    2:02 AM 04/23/2023    6:31 AM  BMP  Glucose 70 - 99 mg/dL 161  096  97   BUN 8 - 23 mg/dL 20  25  23    Creatinine 0.44 - 1.00 mg/dL 0.45  4.09  8.11   Sodium 135 - 145 mmol/L 136  139  141   Potassium 3.5 - 5.1 mmol/L 3.4  3.3  3.5   Chloride 98 - 111 mmol/L 106  105  109   CO2 22 - 32 mmol/L 24  26  22    Calcium 8.9 - 10.3 mg/dL 8.0  7.6  7.9     DG Chest Port 1 View  Result Date: 04/29/2023 CLINICAL DATA:  Short of breath EXAM: PORTABLE CHEST 1 VIEW COMPARISON:  04/24/2023 FINDINGS: Single frontal view of the chest  demonstrates right-sided PICC tip overlying superior vena cava. There are increased bibasilar veiling opacities, which obscure the cardiac silhouette. No pneumothorax. No acute bony abnormalities. IMPRESSION: 1. Worsening bibasilar veiling opacities, consistent with enlarging pleural effusions and progressive basilar consolidation. Electronically Signed   By: Sharlet Salina M.D.   On: 04/29/2023 17:32    Disposition Plan & Communication  Patient status: Inpatient  Admitted From: Home Planned disposition location: Home Anticipated discharge date: TBD pending clinical improvement  Family Communication: spouse at bedside   Author: Leeroy Bock, DO [facility] 04/30/2023, 7:35 AM   Available by Epic secure chat 7AM-7PM. If 7PM-7AM, please contact night-coverage.  [facility] contact information found on [email or web address].

## 2023-04-30 NOTE — Progress Notes (Signed)
Date of Admission:  04/22/2023     ID: Dawn Bruce is a 77 y.o. female  Principal Problem:   Cellulitis of right groin Active Problems:   Chronic pain syndrome   Essential hypertension   HLD (hyperlipidemia)   Chronic diastolic heart failure (HCC)   Sepsis (HCC)   CAD (coronary artery disease)   Chronic kidney disease, stage 3a (HCC)   Peritoneal hematoma   Pleural effusion   Abnormal LFTs   Hypokalemia   Atrial fibrillation, chronic (HCC)   Diarrhea   HFrEF (heart failure with reduced ejection fraction) (HCC)   Severe sepsis (HCC)   Infected wound   Cellulitis of right thigh   Antibiotic 12/2-12/9- cefepime + linezolid 12/9= zosyn  Dawn Bruce is a 77 y.o. female with a history of CAD, HTN, LD was recently in [facility] between 11/21-11/29 for chest pain and found to have STEMI, underwent angio on 11/22 and ostial LADstent placement- complicated by retroperitoneal hematoma, hemorrhagic shock needing  repair of rt external iliac and circumflex artery by Dr.Esco on 11/222/4. She also received PRBC and was in ICU- She was discharge don 04/20/23 and called vascular who prescribed keflex on 11/30. She came back to the ED on 12/1 with infected wound rt groin. Culture sent and was started on Iv vanco and ceftriaxone and I am asked to see the patient.   Subjective: Says she had SOB yesterday and CXR repeated  Medications:   alum & mag hydroxide-simeth  15 mL Oral Once   apixaban  2.5 mg Oral BID   aspirin EC  81 mg Oral Daily   atorvastatin  80 mg Oral Daily   carvedilol  12.5 mg Oral BID WC   Chlorhexidine Gluconate Cloth  6 each Topical Daily   clopidogrel  75 mg Oral Daily   ezetimibe  10 mg Oral Daily   fentaNYL  1 patch Transdermal Q72H   isosorbide mononitrate  60 mg Oral Daily   pantoprazole  40 mg Oral Daily   pregabalin  150 mg Oral BID   sodium chloride flush  10-40 mL Intracatheter Q12H    Objective: Vital signs in last 24 hours: Patient Vitals for the past 24  hrs:  BP Temp Pulse Resp SpO2 Weight  04/30/23 0214 -- -- -- -- -- 71.4 kg  04/30/23 0210 (!) 158/79 (!) 97.4 F (36.3 C) 94 20 95 % --  04/29/23 2025 (!) 179/80 (!) 97.3 F (36.3 C) 89 20 95 % --  04/29/23 1651 132/61 98 F (36.7 C) 87 18 97 % --      PHYSICAL EXAM:  General: Alert, cooperative, no distress, appears stated age.  Lungs: decreased air entry bases Heart: Regular rate and rhythm, no murmur, rub or gallop. Abdomen: Soft, non-tender,not distended. Bowel sounds normal. No masses Rt groin wound vac Extremities: bruising thighs Skin: as above Lymph: Cervical, supraclavicular normal. Neurologic: Grossly non-focal  Lab Results    Latest Ref Rng & Units 04/28/2023    6:10 AM 04/27/2023    5:00 AM 04/26/2023    5:43 PM  CBC  WBC 4.0 - 10.5 K/uL 11.0  10.6  11.0   Hemoglobin 12.0 - 15.0 g/dL 9.2  9.4  9.5   Hematocrit 36.0 - 46.0 % 29.1  28.5  29.6   Platelets 150 - 400 K/uL 264  279  272        Latest Ref Rng & Units 04/26/2023    2:16 AM 04/24/2023    2:02 AM 04/23/2023  6:31 AM  CMP  Glucose 70 - 99 mg/dL 161  096  97   BUN 8 - 23 mg/dL 20  25  23    Creatinine 0.44 - 1.00 mg/dL 0.45  4.09  8.11   Sodium 135 - 145 mmol/L 136  139  141   Potassium 3.5 - 5.1 mmol/L 3.4  3.3  3.5   Chloride 98 - 111 mmol/L 106  105  109   CO2 22 - 32 mmol/L 24  26  22    Calcium 8.9 - 10.3 mg/dL 8.0  7.6  7.9   Total Protein 6.5 - 8.1 g/dL 4.8     Total Bilirubin <1.2 mg/dL 0.9     Alkaline Phos 38 - 126 U/L 44     AST 15 - 41 U/L 27     ALT 0 - 44 U/L 28         Microbiology: Wound culture E.coli, proteus morganella Surgical culture enterococcus fecalis  Studies/Results:   DG Chest Port 1 View  Result Date: 04/29/2023 CLINICAL DATA:  Short of breath EXAM: PORTABLE CHEST 1 VIEW COMPARISON:  04/24/2023 FINDINGS: Single frontal view of the chest demonstrates right-sided PICC tip overlying superior vena cava. There are increased bibasilar veiling opacities, which obscure  the cardiac silhouette. No pneumothorax. No acute bony abnormalities. IMPRESSION: 1. Worsening bibasilar veiling opacities, consistent with enlarging pleural effusions and progressive basilar consolidation. Electronically Signed   By: Sharlet Salina M.D.   On: 04/29/2023 17:32     Assessment/Plan: Infected groin wound at the site of surgery with dehiscence of wound and surrounding cellulitis Culture polymicrobial infection ( 3 gram neg rods and enteococcus debridement done .has wound vac Currently on linezolid/cefepime - will change to zosyn  Left pleural effusion- pleurocentesis in the past    Hematoma both retroperitoneal and superficial at the site of cardiac cath removed   Recent hemorrhagic shock needing PRBc   Injury to external iliac artery on the right which was repaired   CAD s/p stent ( restenosis of ostial LAD stent)? Afib   Discussed the management with the patient and her husband

## 2023-04-30 NOTE — Progress Notes (Signed)
Physical Therapy Treatment Patient Details Name: Dawn Bruce MRN: [id number] DOB: 03-29-1946 Today's Date: 04/30/2023   History of Present Illness Pt is a 77 years old admitted 04/22/23 for cellulitis of right groin. Procedure dated 04/25/23 for (1) excisional debridement of right groin and (2) application of VAC wound dressing.    PT Comments  Pt is progressing with mobility performing bed mobility with Min A and  performing transfers, gait (200'), and dynamic standing balance with RW at supervision level.  Pt continues to report fatigue with activity.  Continued PT will assist pt towards greater activity tolerance and balance to increase safety and independence with functional mobility.    If plan is discharge home, recommend the following: A little help with walking and/or transfers;A little help with bathing/dressing/bathroom;Help with stairs or ramp for entrance;Assist for transportation   Can travel by private vehicle        Equipment Recommendations  None recommended by PT    Recommendations for Other Services       Precautions / Restrictions Precautions Precautions: Fall Restrictions Weight Bearing Restrictions: No RLE Weight Bearing: Weight bearing as tolerated     Mobility  Bed Mobility Overal bed mobility: Needs Assistance Bed Mobility: Sit to Supine       Sit to supine: Min assist   General bed mobility comments: Min A necessary for RLE management.    Transfers Overall transfer level: Needs assistance Equipment used: Rolling walker (2 wheels) Transfers: Sit to/from Stand, Bed to chair/wheelchair/BSC Sit to Stand: Supervision   Step pivot transfers: Supervision            Ambulation/Gait Ambulation/Gait assistance: Supervision Gait Distance (Feet): 200 Feet Assistive device: Rolling walker (2 wheels) Gait Pattern/deviations: Step-through pattern Gait velocity: decreased     General Gait Details: pt tolerated ambulation well without LOB or safety  concern. Pt reported feeling some SOB and tired at end of walk.   Stairs             Wheelchair Mobility     Tilt Bed    Modified Rankin (Stroke Patients Only)       Balance Overall balance assessment: Needs assistance Sitting-balance support: Feet supported Sitting balance-Leahy Scale: Good     Standing balance support: No upper extremity supported, During functional activity, Reliant on assistive device for balance Standing balance-Leahy Scale: Good Standing balance comment: Pt performed standing hygiene after using commode; pt required intermittent UE support with upper trunk rotation activity.  dynamic standing balance training: upper trunk rotations, marching in place, bil UE raises all performed little to no UE support; pt self corrected imbalance as needed.  Pt reported SOB after dynamic standing SPO2 96%.                            Cognition Arousal: Alert Behavior During Therapy: WFL for tasks assessed/performed Overall Cognitive Status: Within Functional Limits for tasks assessed                                          Exercises      General Comments        Pertinent Vitals/Pain Pain Assessment Pain Assessment: 0-10 Pain Score: 5  Pain Location: R groin region Pain Descriptors / Indicators: Aching, Constant Pain Intervention(s): Monitored during session    Home Living  Prior Function            PT Goals (current goals can now be found in the care plan section) Acute Rehab PT Goals Patient Stated Goal: to go home PT Goal Formulation: With patient/family Time For Goal Achievement: 05/11/23 Potential to Achieve Goals: Good Progress towards PT goals: Progressing toward goals    Frequency    Min 1X/week      PT Plan      Co-evaluation              AM-PAC PT "6 Clicks" Mobility   Outcome Measure  Help needed turning from your back to your side while in a flat  bed without using bedrails?: A Little Help needed moving from lying on your back to sitting on the side of a flat bed without using bedrails?: A Little Help needed moving to and from a bed to a chair (including a wheelchair)?: A Little   Help needed to walk in hospital room?: A Little Help needed climbing 3-5 steps with a railing? : A Little 6 Click Score: 15    End of Session Equipment Utilized During Treatment: Gait belt   Patient left: in bed;with call bell/phone within reach;with bed alarm set;with family/visitor present Nurse Communication: Mobility status PT Visit Diagnosis: Unsteadiness on feet (R26.81);Other abnormalities of gait and mobility (R26.89);Muscle weakness (generalized) (M62.81);Difficulty in walking, not elsewhere classified (R26.2);Pain Pain - Right/Left: Right Pain - part of body: Leg     Time: 1610-9604 PT Time Calculation (min) (ACUTE ONLY): 33 min  Charges:    $Gait Training: 8-22 mins $Therapeutic Activity: 8-22 mins PT General Charges $$ ACUTE PT VISIT: 1 Visit                     Hortencia Conradi, PTA  04/30/23, 12:15 PM

## 2023-04-30 NOTE — Progress Notes (Signed)
PULMONOLOGY         Date: 04/30/2023,   MRN# [id number] Dawn Bruce Jun 15, 1945     AdmissionWeight: 54.4 kg                 CurrentWeight: 71.4 kg  Referring provider: Dr Gilda Crease   CHIEF COMPLAINT:   Complete atelectasis of left lung with bilateral pleural effusions.    HISTORY OF PRESENT ILLNESS   77 year old female with a history of chronic GERD, basal cell carcinoma squamous cell carcinoma history of NSTEMI diastolic CHF, CAD status post recent PCI with DES, chronic A-fib on Eliquis, essential hypertension, dyslipidemia CKD 3A chronic pain syndrome remote perforated appendicitis, hospitalized November 2024 with non-STEMI.  Post left heart cath had developed retroperitoneal hematoma and hemorrhagic shock with complications of iliac artery laceration.  Status post surgical repair via vascular surgery service.  Had outpatient therapy with antibiotics with failure came in with transient hypotension and circulatory shock.  Found to have elevated cardiac biomarkers and acute on chronic CHF.  Vascular surgery was reconsulted today and PCCM consultation was placed to incidental finding of complete atelectasis of the left lung with surrounding bilateral pleural effusions worse on the left.  04/25/23- patient evaluated in PACU post op , appears to be mildy sedated still. Mildy hypotensive at this moment.  Lung sounds much improved post 1.6L left pleural space aspiration.  04/26/23- patient seen and examined , [name] (daughter) at bedside.  Patient receiving blood transfusion.  Lung sounds are clear , vitals are stable.  Hypoalbulminemia continues. Will order urine protein and pre albumin levels,  because patient shares she's eating well.  CXR in am to check interval effusion.  04/27/23- patient seen at bedside, husband and daughter present.  For surgery wound washout and vac exchange today.   Vitals stable , room air spO2 99%     04/30/23- patient seen at bedside.  Has  additional surgery planned with debridement.  CXR with recurring effusion and associated compressive atelelctasis.   Ideally would like to increase diuresis and recruitment maunevers.  E. Faecallis noted on wound culture.  I discussed case with Dr Avon Gully infectious disease specialist today.  Dr Dareen Piano is also considering pleurX catheter which patient may need if this does not resolve with medical management. She already had multiple thoracentesis. No noew labs are available for review but are in process. Will order purewick for urination due to physical weakness.     PAST MEDICAL HISTORY   Past Medical History:  Diagnosis Date   Actinic keratosis    Chronic pain    GERD (gastroesophageal reflux disease) ?   Taking Pepcid   History of basal cell carcinoma (BCC) 05/02/2021   right upper back paraspinal   History of SCC (squamous cell carcinoma) of skin 09/10/2019   left dorsum wrist ED&C done 10/28/19   History of SCC (squamous cell carcinoma) of skin 03/04/2020   right dorsum hand   History of squamous cell carcinoma in situ (SCCIS) 03/04/2020   left dorsum wrist  ED&C   History of squamous cell carcinoma in situ (SCCIS) 03/04/2020   right medial infraorbital  ED&C 04/28/2020   History of squamous cell carcinoma in situ (SCCIS) 10/28/2019   right dorsum hand proximal lateral   History of squamous cell carcinoma in situ (SCCIS) 10/28/2019   right dorsum hand proximal medial   Hyperlipidemia    Hypertension    NSTEMI (non-ST elevated myocardial infarction) (HCC) 04/12/2023   Renal disorder  Squamous cell carcinoma in situ 10/31/2021   left distal  tricep, EDC   Squamous cell carcinoma of skin 08/28/2022   Right volar forearm - [facility]     SURGICAL HISTORY   Past Surgical History:  Procedure Laterality Date   ABDOMINAL HYSTERECTOMY     APPENDECTOMY  April 2023   Burst appendix   APPLICATION OF WOUND VAC Right 04/25/2023   Procedure: APPLICATION OF WOUND VAC;  Surgeon:  Renford Dills, MD;  Location: [facility] ORS;  Service: Vascular;  Laterality: Right;   APPLICATION OF WOUND VAC Right 04/27/2023   Procedure: WOUND VAC EXCHANGE;  Surgeon: Renford Dills, MD;  Location: [facility] OR[facility];  Service: Vascular;  Laterality: Right;   AUGMENTATION MAMMAPLASTY Bilateral    25-30 years ago   CARDIOVERSION N/A 12/01/2020   Procedure: CARDIOVERSION;  Surgeon: Debbe Odea, MD;  Location: [facility] OR[facility];  Service: Cardiovascular;  Laterality: N/A;   CAROTID ARTERY ANGIOPLASTY Right 1995(approximate)   CAROTID ENDARTERECTOMY  2001   CORONARY STENT INTERVENTION N/A 04/13/2023   Procedure: CORONARY STENT INTERVENTION;  Surgeon: Marcina Millard, MD;  Location: [facility] INV[facility]IVE CV [facility];  Service: Cardiovascular;  Laterality: N/A;   COSMETIC SURGERY     ELBOW SURGERY Left    ENDARTERECTOMY FEMORAL Right 04/13/2023   Procedure: Right groin exploration with repair of right external iliac artery and right circumflex artery;  Surgeon: Bertram Denver, MD;  Location: [facility] OR[facility];  Service: Vascular;  Laterality: Right;   FRACTURE SURGERY  2016   Arm   INCISION AND DRAINAGE OF WOUND Right 04/25/2023   Procedure: IRRIGATION AND DEBRIDEMENT WOUND;  Surgeon: Renford Dills, MD;  Location: [facility] OR[facility];  Service: Vascular;  Laterality: Right;   INCISION AND DRAINAGE OF WOUND Right 04/27/2023   Procedure: IRRIGATION AND DEBRIDEMENT WOUND;  Surgeon: Renford Dills, MD;  Location: [facility] OR[facility];  Service: Vascular;  Laterality: Right;   LEFT HEART CATH AND CORONARY ANGIOGRAPHY N/A 04/13/2023   Procedure: LEFT HEART CATH AND CORONARY ANGIOGRAPHY;  Surgeon: Marcina Millard, MD;  Location: [facility] IN[facility] CV [facility];  Service: Cardiovascular;  Laterality: N/A;   THORACENTESIS N/A 12/27/2020   Procedure: Alanson Puls;  Surgeon: Josephine Igo, DO;  Location: [facility];  Service: Pulmonary;  Laterality: N/A;   TONSILLECTOMY     TUBAL LIGATION  ?     FAMILY HISTORY   Family History  Problem  Relation Age of Onset   Cancer Mother    Cancer Father    Cancer Sister    Breast cancer Neg Hx      SOCIAL HISTORY   Social History   Tobacco Use   Smoking status: Former    Current packs/day: 0.00    Average packs/day: 0.3 packs/day for 15.0 years (3.8 ttl pk-yrs)    Types: Cigarettes    Start date: 22    Quit date: 38    Years since quitting: 34.9    Passive exposure: Past   Smokeless tobacco: Never   Tobacco comments:    quit around 1990-1995 (?)  Vaping Use   Vaping status: Never Used  Substance Use Topics   Alcohol use: Yes    Alcohol/week: 14.0 standard drinks of alcohol    Types: 14 Standard drinks or equivalent per week    Comment: 2 scotch shots   Drug use: Never     MEDICATIONS    Home Medication:     Current Medication:  Current Facility-Administered Medications:    acetaminophen (TYLENOL) tablet 650 mg, 650 mg, Oral, Q6H  PRN, Gilda Crease, Latina Craver, MD, 650 mg at 04/26/23 1913   albuterol (PROVENTIL) (2.5 MG/3ML) 0.083% nebulizer solution 2.5 mg, 2.5 mg, Nebulization, Q4H PRN, Schnier, Latina Craver, MD   alum & mag hydroxide-simeth (MAALOX/MYLANTA) 200-200-20 MG/5ML suspension 15 mL, 15 mL, Oral, Once, Schnier, Latina Craver, MD   apixaban Everlene Balls) tablet 2.5 mg, 2.5 mg, Oral, BID, Schnier, Latina Craver, MD, 2.5 mg at 04/29/23 2109   aspirin EC tablet 81 mg, 81 mg, Oral, Daily, Schnier, Latina Craver, MD, 81 mg at 04/29/23 1005   atorvastatin (LIPITOR) tablet 80 mg, 80 mg, Oral, Daily, Schnier, Latina Craver, MD, 80 mg at 04/29/23 1005   carvedilol (COREG) tablet 12.5 mg, 12.5 mg, Oral, BID WC, Enedina Finner, MD, 12.5 mg at 04/29/23 1654   ceFEPIme (MAXIPIME) 2 g in sodium chloride 0.9 % 100 mL IVPB, 2 g, Intravenous, Q12H, Schnier, Latina Craver, MD, Last Rate: 200 mL/hr at 04/30/23 0531, 2 g at 04/30/23 0531   Chlorhexidine Gluconate Cloth 2 % PADS 6 each, 6 each, Topical, Daily, Schnier, Latina Craver, MD, 6 each at 04/29/23 1006   clopidogrel (PLAVIX) tablet 75 mg, 75 mg,  Oral, Daily, Schnier, Latina Craver, MD, 75 mg at 04/29/23 1006   ezetimibe (ZETIA) tablet 10 mg, 10 mg, Oral, Daily, Schnier, Latina Craver, MD, 10 mg at 04/28/23 0904   fentaNYL (DURAGESIC) 75 MCG/HR 1 patch, 1 patch, Transdermal, Q72H, Schnier, Latina Craver, MD, 1 patch at 04/29/23 1511   ibuprofen (ADVIL) tablet 200 mg, 200 mg, Oral, Q6H PRN, Schnier, Latina Craver, MD, 200 mg at 04/22/23 2254   isosorbide mononitrate (IMDUR) 24 hr tablet 60 mg, 60 mg, Oral, Daily, Enedina Finner, MD, 60 mg at 04/29/23 1005   linezolid (ZYVOX) IVPB 600 mg, 600 mg, Intravenous, Q12H, Schnier, Latina Craver, MD, Last Rate: 300 mL/hr at 04/29/23 2113, 600 mg at 04/29/23 2113   loperamide (IMODIUM) capsule 2 mg, 2 mg, Oral, BID PRN, Schnier, Latina Craver, MD   oxyCODONE (Oxy IR/ROXICODONE) immediate release tablet 5 mg, 5 mg, Oral, Q6H PRN, Schnier, Latina Craver, MD, 5 mg at 04/29/23 2328   pantoprazole (PROTONIX) EC tablet 40 mg, 40 mg, Oral, Daily, Schnier, Latina Craver, MD, 40 mg at 04/29/23 1005   phenylephrine ((USE for PREPARATION-H)) 0.25 % suppository 1 suppository, 1 suppository, Rectal, PRN, Schnier, Latina Craver, MD   pregabalin (LYRICA) capsule 150 mg, 150 mg, Oral, BID, Schnier, Latina Craver, MD, 150 mg at 04/29/23 2108   sodium chloride flush (NS) 0.9 % injection 10-40 mL, 10-40 mL, Intracatheter, Q12H, Schnier, Latina Craver, MD, 10 mL at 04/29/23 2109   sodium chloride flush (NS) 0.9 % injection 10-40 mL, 10-40 mL, Intracatheter, PRN, Schnier, Latina Craver, MD   traZODone (DESYREL) tablet 25-50 mg, 25-50 mg, Oral, QHS PRN, Gilda Crease, Latina Craver, MD, 50 mg at 04/29/23 2020    ALLERGIES   Patient has no known allergies.     REVIEW OF SYSTEMS    Review of Systems:  Gen:  Denies  fever, sweats, chills weigh loss  HEENT: Denies blurred vision, double vision, ear pain, eye pain, hearing loss, nose bleeds, sore throat Cardiac:  No dizziness, chest pain or heaviness, chest tightness,edema Resp:   reports dyspnea chronically  Gi: Denies  swallowing difficulty, stomach pain, nausea or vomiting, diarrhea, constipation, bowel incontinence Gu:  Denies bladder incontinence, burning urine Ext:   Denies Joint pain, stiffness or swelling Skin: Denies  skin rash, easy bruising or bleeding or hives Endoc:  Denies polyuria, polydipsia , polyphagia or  weight change Psych:   Denies depression, insomnia or hallucinations   Other:  All other systems negative   VS: BP (!) 158/79 (BP Location: Right Arm)   Pulse 94   Temp (!) 97.4 F (36.3 C)   Resp 20   Ht 5' 2.99" (1.6 m)   Wt 71.4 kg   SpO2 95%   BMI 27.89 kg/m      PHYSICAL EXAM    GENERAL:NAD, no fevers, chills, no weakness no fatigue HEAD: Normocephalic, atraumatic.  EYES: Pupils equal, round, reactive to light. Extraocular muscles intact. No scleral icterus.  MOUTH: Moist mucosal membrane. Dentition intact. No abscess noted.  EAR, NOSE, THROAT: Clear without exudates. No external lesions.  NECK: Supple. No thyromegaly. No nodules. No JVD.  PULMONARY: decreased breath sounds with mild rhonchi worse at bases bilaterally.  CARDIOVASCULAR: S1 and S2. Regular rate and rhythm. No murmurs, rubs, or gallops. No edema. Pedal pulses 2+ bilaterally.  GASTROINTESTINAL: Soft, nontender, nondistended. No masses. Positive bowel sounds. No hepatosplenomegaly.  MUSCULOSKELETAL: No swelling, clubbing, or edema. Range of motion full in all extremities.  NEUROLOGIC: Cranial nerves II through XII are intact. No gross focal neurological deficits. Sensation intact. Reflexes intact.  SKIN: No ulceration, lesions, rashes, or cyanosis. Skin warm and dry. Turgor intact.  PSYCHIATRIC: Mood, affect within normal limits. The patient is awake, alert and oriented x 3. Insight, judgment intact.       IMAGING     ASSESSMENT/PLAN    Complete atelectasis of left lung   - patient s/p diuresis with partial improvement however still has minimal lung sounds on left.   - she does have CKD 3 and may  not produce adequate fluid removal via medication alone   - we discussed options for thoracentesis and patient would like to proceed with this procedure.  -incentive spirometry at bedside to be used multiple times daily  - PT/OT - etiology is likely poor nutrition post ICU hospitalization with CHF and CKD all contributing to third spaced interstitial edema , peripheral edema and effusion     Bilateral pleural effusions    - s/p throacentesis Sept 2024   - consultation for IR with US guided throacentesis - therapeutic    -patient on heparin drip -prealbumin -urine protein -BP is elevated , we will diurese today with IV lasix and aldactone 50mg  po daily, additionally will increase albumin to potentiate delivery of diuretic to target receptor at [facility] and reduce third spaced fluid loss. n   Sepsis due to right groin cellulitis   S/p vascular surgery eval S/p vanco /rocephin  - patient appears to be clinically improved.  - s/p ID revaluation - appreciate input  -E faecalis noted on    Moderate proteic calorie malnutrition   - nutritional consultation    - bitemporal wasting and peripheral muscle weakness noted   - contributing to edema/effusion   - complicating tissue healing  -high protein diet  Thank you for allowing me to participate in the care of this patient.  Patient/Family are satisfied with care plan and all questions have been answered.    Provider disclosure: Patient with at least one acute or chronic illness or injury that poses a threat to life or bodily function and is being managed actively during this encounter.  All of the below services have been performed independently by signing provider:  review of prior documentation from internal and or external health records.  Review of previous and current lab results.  Interview and comprehensive assessment during  patient visit today. Review of current and previous chest radiographs/CT scans. Discussion of management and  test interpretation with health care team and patient/family.   This document was prepared using Dragon voice recognition software and may include unintentional dictation errors.     Vida Rigger, M.D.  Division of Pulmonary & Critical Care Medicine

## 2023-04-30 NOTE — Progress Notes (Signed)
Patient to have purewick overnight d/t lasix use. Educated on need to d/c in AM. Pt verbalizes understanding.

## 2023-05-01 ENCOUNTER — Inpatient Hospital Stay: Payer: Medicare Other

## 2023-05-01 ENCOUNTER — Encounter
Admission: EM | Disposition: A | Discharge status: Home Health | Payer: Self-pay | Source: Home / Self Care | Attending: Internal Medicine

## 2023-05-01 ENCOUNTER — Inpatient Hospital Stay: Payer: Medicare Other | Admitting: Anesthesiology

## 2023-05-01 ENCOUNTER — Other Ambulatory Visit: Payer: Self-pay

## 2023-05-01 DIAGNOSIS — B952 Enterococcus as the cause of diseases classified elsewhere: Secondary | ICD-10-CM | POA: Diagnosis not present

## 2023-05-01 DIAGNOSIS — I97630 Postprocedural hematoma of a circulatory system organ or structure following a cardiac catheterization: Secondary | ICD-10-CM | POA: Diagnosis not present

## 2023-05-01 DIAGNOSIS — T8131XA Disruption of external operation (surgical) wound, not elsewhere classified, initial encounter: Secondary | ICD-10-CM | POA: Diagnosis not present

## 2023-05-01 DIAGNOSIS — I2510 Atherosclerotic heart disease of native coronary artery without angina pectoris: Secondary | ICD-10-CM | POA: Diagnosis not present

## 2023-05-01 DIAGNOSIS — T8142XA Infection following a procedure, deep incisional surgical site, initial encounter: Secondary | ICD-10-CM | POA: Diagnosis not present

## 2023-05-01 DIAGNOSIS — I482 Chronic atrial fibrillation, unspecified: Secondary | ICD-10-CM | POA: Diagnosis not present

## 2023-05-01 DIAGNOSIS — I509 Heart failure, unspecified: Secondary | ICD-10-CM | POA: Diagnosis not present

## 2023-05-01 DIAGNOSIS — L03314 Cellulitis of groin: Secondary | ICD-10-CM | POA: Diagnosis not present

## 2023-05-01 HISTORY — PX: APPLICATION OF WOUND VAC: SHX5189

## 2023-05-01 HISTORY — PX: INCISION AND DRAINAGE OF WOUND: SHX1803

## 2023-05-01 LAB — CBC
HCT: 26.7 % — ABNORMAL LOW (ref 36.0–46.0)
Hemoglobin: 8.7 g/dL — ABNORMAL LOW (ref 12.0–15.0)
MCH: 28.2 pg (ref 26.0–34.0)
MCHC: 32.6 g/dL (ref 30.0–36.0)
MCV: 86.4 fL (ref 80.0–100.0)
Platelets: 226 10*3/uL (ref 150–400)
RBC: 3.09 MIL/uL — ABNORMAL LOW (ref 3.87–5.11)
RDW: 16.4 % — ABNORMAL HIGH (ref 11.5–15.5)
WBC: 8.9 10*3/uL (ref 4.0–10.5)
nRBC: 0 % (ref 0.0–0.2)

## 2023-05-01 LAB — BASIC METABOLIC PANEL
Anion gap: 6 (ref 5–15)
BUN: 43 mg/dL — ABNORMAL HIGH (ref 8–23)
CO2: 25 mmol/L (ref 22–32)
Calcium: 8.7 mg/dL — ABNORMAL LOW (ref 8.9–10.3)
Chloride: 107 mmol/L (ref 98–111)
Creatinine, Ser: 1.72 mg/dL — ABNORMAL HIGH (ref 0.44–1.00)
GFR, Estimated: 30 mL/min — ABNORMAL LOW (ref >60)
Glucose, Bld: 116 mg/dL — ABNORMAL HIGH (ref 70–99)
Potassium: 3.9 mmol/L (ref 3.5–5.1)
Sodium: 138 mmol/L (ref 135–145)

## 2023-05-01 SURGERY — IRRIGATION AND DEBRIDEMENT WOUND
Anesthesia: General | Laterality: Right

## 2023-05-01 SURGERY — IRRIGATION AND DEBRIDEMENT WOUND
Anesthesia: General | Site: Groin | Laterality: Right

## 2023-05-01 MED ORDER — PROPOFOL 10 MG/ML IV BOLUS
INTRAVENOUS | Status: AC
  Filled 2023-05-01: qty 20

## 2023-05-01 MED ORDER — ONDANSETRON HCL 4 MG/2ML IJ SOLN
INTRAMUSCULAR | Status: DC | PRN
  Administered 2023-05-01: 4 mg via INTRAVENOUS

## 2023-05-01 MED ORDER — FENTANYL CITRATE (PF) 100 MCG/2ML IJ SOLN
INTRAMUSCULAR | Status: DC | PRN
  Administered 2023-05-01 (×2): 25 ug via INTRAVENOUS

## 2023-05-01 MED ORDER — FENTANYL CITRATE (PF) 100 MCG/2ML IJ SOLN: 25.0000 ug | INTRAMUSCULAR | Status: DC | PRN

## 2023-05-01 MED ORDER — CHLORHEXIDINE GLUCONATE 4 % EX SOLN: 60.0000 mL | Freq: Once | CUTANEOUS | Status: DC

## 2023-05-01 MED ORDER — FAMOTIDINE 20 MG PO TABS: 40.0000 mg | ORAL_TABLET | Freq: Once | ORAL | Status: DC | PRN

## 2023-05-01 MED ORDER — LACTATED RINGERS IV SOLN
INTRAVENOUS | Status: DC
Start: 2023-05-01 — End: 2023-05-01

## 2023-05-01 MED ORDER — CARVEDILOL 25 MG PO TABS
25.0000 mg | ORAL_TABLET | Freq: Two times a day (BID) | ORAL | Status: DC
  Administered 2023-05-02: 25 mg via ORAL
  Filled 2023-05-01 (×2): qty 1

## 2023-05-01 MED ORDER — PROPOFOL 500 MG/50ML IV EMUL
INTRAVENOUS | Status: DC | PRN
  Administered 2023-05-01: 100 ug/kg/min via INTRAVENOUS

## 2023-05-01 MED ORDER — DIPHENHYDRAMINE HCL 50 MG/ML IJ SOLN: 50.0000 mg | Freq: Once | INTRAMUSCULAR | Status: DC | PRN

## 2023-05-01 MED ORDER — PROPOFOL 1000 MG/100ML IV EMUL
INTRAVENOUS | Status: AC
  Filled 2023-05-01: qty 100

## 2023-05-01 MED ORDER — ONDANSETRON HCL 4 MG/2ML IJ SOLN: 4.0000 mg | Freq: Once | INTRAMUSCULAR | Status: DC | PRN

## 2023-05-01 MED ORDER — FENTANYL CITRATE (PF) 100 MCG/2ML IJ SOLN
INTRAMUSCULAR | Status: AC
  Filled 2023-05-01: qty 2

## 2023-05-01 MED ORDER — ACETAMINOPHEN 10 MG/ML IV SOLN: 1000.0000 mg | Freq: Once | INTRAVENOUS | Status: DC | PRN

## 2023-05-01 MED ORDER — METHYLPREDNISOLONE SODIUM SUCC 125 MG IJ SOLR: 125.0000 mg | Freq: Once | INTRAMUSCULAR | Status: DC | PRN

## 2023-05-01 MED ORDER — CHLORHEXIDINE GLUCONATE 4 % EX SOLN
60.0000 mL | Freq: Once | CUTANEOUS | Status: AC
  Administered 2023-05-01: 4 via TOPICAL

## 2023-05-01 MED ORDER — SODIUM CHLORIDE 0.9 % IV SOLN
INTRAVENOUS | Status: DC
Start: 2023-05-01 — End: 2023-05-01

## 2023-05-01 MED ORDER — CEFAZOLIN SODIUM-DEXTROSE 2-4 GM/100ML-% IV SOLN
2.0000 g | INTRAVENOUS | Status: AC
  Administered 2023-05-01: 2 g via INTRAVENOUS
  Filled 2023-05-01: qty 100

## 2023-05-01 MED ORDER — PHENYLEPHRINE 80 MCG/ML (10ML) SYRINGE FOR IV PUSH (FOR BLOOD PRESSURE SUPPORT)
PREFILLED_SYRINGE | INTRAVENOUS | Status: DC | PRN
  Administered 2023-05-01 (×2): 80 ug via INTRAVENOUS

## 2023-05-01 MED ORDER — 0.9 % SODIUM CHLORIDE (POUR BTL) OPTIME
TOPICAL | Status: DC | PRN
  Administered 2023-05-01: 1000 mL

## 2023-05-01 SURGICAL SUPPLY — 28 items
BNDG COHESIVE 6X5 TAN ST LF (GAUZE/BANDAGES/DRESSINGS) ×1 IMPLANT
CANISTER WOUND CARE 500ML ATS (WOUND CARE) IMPLANT
CHLORAPREP W/TINT 26 (MISCELLANEOUS) ×1 IMPLANT
DRAPE EXTREMITY 106X87X128.5 (DRAPES) IMPLANT
DRAPE INCISE IOBAN 66X45 STRL (DRAPES) IMPLANT
DRSG EMULSION OIL 3X3 NADH (GAUZE/BANDAGES/DRESSINGS) IMPLANT
DRSG VAC GRANUFOAM LG (GAUZE/BANDAGES/DRESSINGS) IMPLANT
DRSG VAC GRANUFOAM MED (GAUZE/BANDAGES/DRESSINGS) IMPLANT
ELECT REM PT RETURN 9FT ADLT (ELECTROSURGICAL) ×1
ELECTRODE REM PT RTRN 9FT ADLT (ELECTROSURGICAL) ×1 IMPLANT
GAUZE SPONGE 4X4 12PLY STRL (GAUZE/BANDAGES/DRESSINGS) IMPLANT
GAUZE STRETCH 2X75IN STRL (MISCELLANEOUS) IMPLANT
GLOVE BIO SURGEON STRL SZ7 (GLOVE) ×1 IMPLANT
GLOVE SURG SYN 8.0 (GLOVE) ×1 IMPLANT
GLOVE SURG SYN 8.0 PF PI (GLOVE) ×1 IMPLANT
GOWN STRL REUS W/ TWL LRG LVL3 (GOWN DISPOSABLE) ×2 IMPLANT
GOWN STRL REUS W/ TWL XL LVL3 (GOWN DISPOSABLE) ×1 IMPLANT
KIT TURNOVER KIT A (KITS) ×1 IMPLANT
LABEL OR SOLS (LABEL) ×1 IMPLANT
MANIFOLD NEPTUNE II (INSTRUMENTS) ×1 IMPLANT
NS IRRIG 500ML POUR BTL (IV SOLUTION) ×1 IMPLANT
PACK EXTREMITY ARMC (MISCELLANEOUS) ×1 IMPLANT
PAD PREP OB/GYN DISP 24X41 (PERSONAL CARE ITEMS) ×1 IMPLANT
SOL PREP PVP 2OZ (MISCELLANEOUS) ×1
SOLUTION PREP PVP 2OZ (MISCELLANEOUS) ×1 IMPLANT
STOCKINETTE IMPERV 14X48 (MISCELLANEOUS) ×1 IMPLANT
TRAP FLUID SMOKE EVACUATOR (MISCELLANEOUS) ×1 IMPLANT
WATER STERILE IRR 500ML POUR (IV SOLUTION) ×1 IMPLANT

## 2023-05-01 NOTE — Progress Notes (Signed)
PT Cancellation Note  Patient Details Name: Dawn Bruce MRN: [id number] DOB: [birth year]   Cancelled Treatment:    Reason Eval/Treat Not Completed: Other (comment): Chart Reviewed. Patient supine in bed with family member at bedside. Patient declining PT services this date due to fatigue after procedure this AM. Patient agreeable to therapist checking back in at later date/time. Will continue to follow acutely.    Creed Copper Fairly, PT, DPT 05/01/23 2:50 PM

## 2023-05-01 NOTE — Progress Notes (Signed)
PULMONOLOGY         Date: 05/01/2023,   MRN# [id number] Dawn Bruce 1945-08-10     AdmissionWeight: 54.4 kg                 CurrentWeight: 68.6 kg  Referring provider: Dr Gilda Crease   CHIEF COMPLAINT:   Complete atelectasis of left lung with bilateral pleural effusions.    HISTORY OF PRESENT ILLNESS   77 year old female with a history of chronic GERD, basal cell carcinoma squamous cell carcinoma history of NSTEMI diastolic CHF, CAD status post recent PCI with DES, chronic A-fib on Eliquis, essential hypertension, dyslipidemia CKD 3A chronic pain syndrome remote perforated appendicitis, hospitalized November 2024 with non-STEMI.  Post left heart cath had developed retroperitoneal hematoma and hemorrhagic shock with complications of iliac artery laceration.  Status post surgical repair via vascular surgery service.  Had outpatient therapy with antibiotics with failure came in with transient hypotension and circulatory shock.  Found to have elevated cardiac biomarkers and acute on chronic CHF.  Vascular surgery was reconsulted today and PCCM consultation was placed to incidental finding of complete atelectasis of the left lung with surrounding bilateral pleural effusions worse on the left.  04/25/23- patient evaluated in PACU post op , appears to be mildy sedated still. Mildy hypotensive at this moment.  Lung sounds much improved post 1.6L left pleural space aspiration.  04/26/23- patient seen and examined , Dawn Bruce (daughter) at bedside.  Patient receiving blood transfusion.  Lung sounds are clear , vitals are stable.  Hypoalbulminemia continues. Will order urine protein and pre albumin levels,  because patient shares she's eating well.  CXR in am to check interval effusion.  04/27/23- patient seen at bedside, husband and daughter present.  For surgery wound washout and vac exchange today.   Vitals stable , room air spO2 99%     04/30/23- patient seen at bedside.  Has  additional surgery planned with debridement.  CXR with recurring effusion and associated compressive atelelctasis.   Ideally would like to increase diuresis and recruitment maunevers.  E. Faecallis noted on wound culture.  I discussed case with Dr Avon Gully infectious disease specialist today.  Dr Dareen Piano is also considering pleurX catheter which patient may need if this does not resolve with medical management. She already had multiple thoracentesis. No noew labs are available for review but are in process. Will order purewick for urination due to physical weakness.      05/01/23-  patient reports improvement post diuresis.  Will obtain interval CXR today.  We discussed PleurX permanent tunneled catheter and she declined to have this done at the moment.  She is on room air with normoxia.  She developed mild contraction and we will take diuretic holiday.   PAST MEDICAL HISTORY   Past Medical History:  Diagnosis Date   Actinic keratosis    Chronic pain    GERD (gastroesophageal reflux disease) ?   Taking Pepcid   History of basal cell carcinoma (BCC) 05/02/2021   right upper back paraspinal   History of SCC (squamous cell carcinoma) of skin 09/10/2019   left dorsum wrist ED&C done 10/28/19   History of SCC (squamous cell carcinoma) of skin 03/04/2020   right dorsum hand   History of squamous cell carcinoma in situ (SCCIS) 03/04/2020   left dorsum wrist  ED&C   History of squamous cell carcinoma in situ (SCCIS) 03/04/2020   right medial infraorbital  ED&C 04/28/2020   History of squamous cell carcinoma  in situ (SCCIS) 10/28/2019   right dorsum hand proximal lateral   History of squamous cell carcinoma in situ (SCCIS) 10/28/2019   right dorsum hand proximal medial   Hyperlipidemia    Hypertension    NSTEMI (non-ST elevated myocardial infarction) (HCC) 04/12/2023   Renal disorder    Squamous cell carcinoma in situ 10/31/2021   left distal  tricep, EDC   Squamous cell carcinoma of skin  08/28/2022   Right volar forearm - [facility]     SURGICAL HISTORY   Past Surgical History:  Procedure Laterality Date   ABDOMINAL HYSTERECTOMY     APPENDECTOMY  April 2023   Burst appendix   APPLICATION OF WOUND VAC Right 04/25/2023   Procedure: APPLICATION OF WOUND VAC;  Surgeon: Renford Dills, MD;  Location: [facility] ORS;  Service: Vascular;  Laterality: Right;   APPLICATION OF WOUND VAC Right 04/27/2023   Procedure: WOUND VAC EXCHANGE;  Surgeon: Renford Dills, MD;  Location: [facility] OR[facility];  Service: Vascular;  Laterality: Right;   AUGMENTATION MAMMAPLASTY Bilateral    25-30 years ago   CARDIOVERSION N/A 12/01/2020   Procedure: CARDIOVERSION;  Surgeon: Debbe Odea, MD;  Location: [facility];  Service: Cardiovascular;  Laterality: N/A;   CAROTID ARTERY ANGIOPLASTY Right 1995(approximate)   CAROTID ENDARTERECTOMY  2001   CORONARY STENT INTERVENTION N/A 04/13/2023   Procedure: CORONARY STENT INTERVENTION;  Surgeon: Marcina Millard, MD;  Location: [facility];  Service: Cardiovascular;  Laterality: N/A;   COSMETIC SURGERY     ELBOW SURGERY Left    ENDARTERECTOMY FEMORAL Right 04/13/2023   Procedure: Right groin exploration with repair of right external iliac artery and right circumflex artery;  Surgeon: Bertram Denver, MD;  Location: [facility];  Service: Vascular;  Laterality: Right;   FRACTURE SURGERY  2016   Arm   INCISION AND DRAINAGE OF WOUND Right 04/25/2023   Procedure: IRRIGATION AND DEBRIDEMENT WOUND;  Surgeon: Renford Dills, MD;  Location: [facility];  Service: Vascular;  Laterality: Right;   INCISION AND DRAINAGE OF WOUND Right 04/27/2023   Procedure: IRRIGATION AND DEBRIDEMENT WOUND;  Surgeon: Renford Dills, MD;  Location: [facility];  Service: Vascular;  Laterality: Right;   LEFT HEART CATH AND CORONARY ANGIOGRAPHY N/A 04/13/2023   Procedure: LEFT HEART CATH AND CORONARY ANGIOGRAPHY;  Surgeon: Marcina Millard, MD;  Location: [facility];   Service: Cardiovascular;  Laterality: N/A;   THORACENTESIS N/A 12/27/2020   Procedure: Alanson Puls;  Surgeon: Josephine Igo, DO;  Location: [facility];  Service: Pulmonary;  Laterality: N/A;   TONSILLECTOMY     TUBAL LIGATION  ?     FAMILY HISTORY   Family History  Problem Relation Age of Onset   Cancer Mother    Cancer Father    Cancer Sister    Breast cancer Neg Hx      SOCIAL HISTORY   Social History   Tobacco Use   Smoking status: Former    Current packs/day: 0.00    Average packs/day: 0.3 packs/day for 15.0 years (3.8 ttl pk-yrs)    Types: Cigarettes    Start date: 40    Quit date: 46    Years since quitting: 34.9    Passive exposure: Past   Smokeless tobacco: Never   Tobacco comments:    quit around 1990-1995 (?)  Vaping Use   Vaping status: Never Used  Substance Use Topics   Alcohol use: Yes    Alcohol/week: 14.0 standard drinks of alcohol  Types: 14 Standard drinks or equivalent per week    Comment: 2 scotch shots   Drug use: Never     MEDICATIONS    Home Medication:     Current Medication:  Current Facility-Administered Medications:    acetaminophen (TYLENOL) tablet 650 mg, 650 mg, Oral, Q6H PRN, Schnier, Latina Craver, MD, 650 mg at 04/26/23 1913   albuterol (PROVENTIL) (2.5 MG/3ML) 0.083% nebulizer solution 2.5 mg, 2.5 mg, Nebulization, Q4H PRN, Schnier, Latina Craver, MD, 2.5 mg at 04/30/23 2108   alum & mag hydroxide-simeth (MAALOX/MYLANTA) 200-200-20 MG/5ML suspension 15 mL, 15 mL, Oral, Once, Schnier, Latina Craver, MD   apixaban Everlene Balls) tablet 2.5 mg, 2.5 mg, Oral, BID, Schnier, Latina Craver, MD, 2.5 mg at 05/01/23 1051   aspirin EC tablet 81 mg, 81 mg, Oral, Daily, Schnier, Latina Craver, MD, 81 mg at 05/01/23 1052   atorvastatin (LIPITOR) tablet 80 mg, 80 mg, Oral, Daily, Schnier, Latina Craver, MD, 80 mg at 05/01/23 1052   carvedilol (COREG) tablet 25 mg, 25 mg, Oral, BID WC, Leeroy Bock, MD   Chlorhexidine Gluconate Cloth 2 % PADS 6  each, 6 each, Topical, Daily, Schnier, Latina Craver, MD, 6 each at 05/01/23 1053   clopidogrel (PLAVIX) tablet 75 mg, 75 mg, Oral, Daily, Schnier, Latina Craver, MD, 75 mg at 05/01/23 1052   ezetimibe (ZETIA) tablet 10 mg, 10 mg, Oral, Daily, Schnier, Latina Craver, MD, 10 mg at 05/01/23 1052   fentaNYL (DURAGESIC) 75 MCG/HR 1 patch, 1 patch, Transdermal, Q72H, Schnier, Latina Craver, MD, 1 patch at 04/29/23 1511   furosemide (LASIX) injection 40 mg, 40 mg, Intravenous, Daily, Schnier, Latina Craver, MD, 40 mg at 05/01/23 1053   ibuprofen (ADVIL) tablet 200 mg, 200 mg, Oral, Q6H PRN, Schnier, Latina Craver, MD, 200 mg at 04/22/23 2254   isosorbide mononitrate (IMDUR) 24 hr tablet 60 mg, 60 mg, Oral, Daily, Schnier, Latina Craver, MD, 60 mg at 05/01/23 1052   loperamide (IMODIUM) capsule 2 mg, 2 mg, Oral, BID PRN, Schnier, Latina Craver, MD   oxyCODONE (Oxy IR/ROXICODONE) immediate release tablet 5 mg, 5 mg, Oral, Q6H PRN, Schnier, Latina Craver, MD, 5 mg at 05/01/23 1053   pantoprazole (PROTONIX) EC tablet 40 mg, 40 mg, Oral, Daily, Schnier, Latina Craver, MD, 40 mg at 05/01/23 1052   phenylephrine ((USE for PREPARATION-H)) 0.25 % suppository 1 suppository, 1 suppository, Rectal, PRN, Schnier, Latina Craver, MD   piperacillin-tazobactam (ZOSYN) IVPB 3.375 g, 3.375 g, Intravenous, Q8H, Schnier, Latina Craver, MD, Last Rate: 12.5 mL/hr at 05/01/23 0500, 3.375 g at 05/01/23 0500   pregabalin (LYRICA) capsule 150 mg, 150 mg, Oral, BID, Schnier, Latina Craver, MD, 150 mg at 05/01/23 1051   sodium chloride flush (NS) 0.9 % injection 10-40 mL, 10-40 mL, Intracatheter, Q12H, Schnier, Latina Craver, MD, 10 mL at 05/01/23 1053   sodium chloride flush (NS) 0.9 % injection 10-40 mL, 10-40 mL, Intracatheter, PRN, Schnier, Latina Craver, MD   spironolactone (ALDACTONE) tablet 50 mg, 50 mg, Oral, Daily, Schnier, Latina Craver, MD, 50 mg at 05/01/23 1052   traZODone (DESYREL) tablet 50 mg, 50 mg, Oral, QHS, Schnier, Latina Craver, MD, 50 mg at 04/30/23 2100    ALLERGIES    Patient has no known allergies.     REVIEW OF SYSTEMS    Review of Systems:  Gen:  Denies  fever, sweats, chills weigh loss  HEENT: Denies blurred vision, double vision, ear pain, eye pain, hearing loss, nose bleeds, sore throat Cardiac:  No dizziness, chest pain  or heaviness, chest tightness,edema Resp:   reports dyspnea chronically  Gi: Denies swallowing difficulty, stomach pain, nausea or vomiting, diarrhea, constipation, bowel incontinence Gu:  Denies bladder incontinence, burning urine Ext:   Denies Joint pain, stiffness or swelling Skin: Denies  skin rash, easy bruising or bleeding or hives Endoc:  Denies polyuria, polydipsia , polyphagia or weight change Psych:   Denies depression, insomnia or hallucinations   Other:  All other systems negative   VS: BP (!) 144/62 (BP Location: Right Arm)   Pulse 97   Temp 97.7 F (36.5 C) (Oral)   Resp 18   Ht 5' 2.99" (1.6 m)   Wt 68.6 kg   SpO2 92%   BMI 26.80 kg/m      PHYSICAL EXAM    GENERAL:NAD, no fevers, chills, no weakness no fatigue HEAD: Normocephalic, atraumatic.  EYES: Pupils equal, round, reactive to light. Extraocular muscles intact. No scleral icterus.  MOUTH: Moist mucosal membrane. Dentition intact. No abscess noted.  EAR, NOSE, THROAT: Clear without exudates. No external lesions.  NECK: Supple. No thyromegaly. No nodules. No JVD.  PULMONARY: decreased breath sounds with mild rhonchi worse at bases bilaterally.  CARDIOVASCULAR: S1 and S2. Regular rate and rhythm. No murmurs, rubs, or gallops. No edema. Pedal pulses 2+ bilaterally.  GASTROINTESTINAL: Soft, nontender, nondistended. No masses. Positive bowel sounds. No hepatosplenomegaly.  MUSCULOSKELETAL: No swelling, clubbing, or edema. Range of motion full in all extremities.  NEUROLOGIC: Cranial nerves II through XII are intact. No gross focal neurological deficits. Sensation intact. Reflexes intact.  SKIN: No ulceration, lesions, rashes, or cyanosis.  Skin warm and dry. Turgor intact.  PSYCHIATRIC: Mood, affect within normal limits. The patient is awake, alert and oriented x 3. Insight, judgment intact.       IMAGING     ASSESSMENT/PLAN    Complete atelectasis of left lung   - patient s/p diuresis with partial improvement however still has minimal lung sounds on left.   - she does have CKD 3 and may not produce adequate fluid removal via medication alone   - we discussed options for thoracentesis and patient would like to proceed with this procedure.  -incentive spirometry at bedside to be used multiple times daily  - PT/OT - etiology is likely poor nutrition post ICU hospitalization with CHF and CKD all contributing to third spaced interstitial edema , peripheral edema and effusion     Bilateral pleural effusions    - s/p throacentesis Sept 2024   - consultation for IR with US guided throacentesis - therapeutic    -patient on heparin drip -prealbumin -urine protein -holding diuresis today  Sepsis due to right groin cellulitis   S/p vascular surgery eval S/p vanco /rocephin  - patient appears to be clinically improved.  - s/p ID revaluation - appreciate input  -E faecalis noted on    Moderate proteic calorie malnutrition   - nutritional consultation    - bitemporal wasting and peripheral muscle weakness noted   - contributing to edema/effusion   - complicating tissue healing  -high protein diet  Thank you for allowing me to participate in the care of this patient.  Patient/Family are satisfied with care plan and all questions have been answered.    Provider disclosure: Patient with at least one acute or chronic illness or injury that poses a threat to life or bodily function and is being managed actively during this encounter.  All of the below services have been performed independently by signing provider:  review  of prior documentation from internal and or external health records.  Review of previous and  current lab results.  Interview and comprehensive assessment during patient visit today. Review of current and previous chest radiographs/CT scans. Discussion of management and test interpretation with health care team and patient/family.   This document was prepared using Dragon voice recognition software and may include unintentional dictation errors.     Vida Rigger, M.D.  Division of Pulmonary & Critical Care Medicine

## 2023-05-01 NOTE — TOC Progression Note (Signed)
Transition of Care [facility]) - Progression Note    Patient Details  Name: Cadince Szopinski MRN: [id number] Date of Birth: 1945/10/28  Transition of [facility]) CM/SW Contact  Chapman Fitch, RN Phone Number: 05/01/2023, 11:41 AM  Clinical Narrative:      Message sent to [place] Ana with [employer] for update on ETA of home vac delivery      Expected Discharge Plan and Services                                               Social Determinants of Health (SDOH) Interventions SDOH Screenings   Food Insecurity: No Food Insecurity (04/23/2023)  Housing: Low Risk  (04/23/2023)  Transportation Needs: No Transportation Needs (04/23/2023)  Utilities: Not At Risk (04/23/2023)  Alcohol Screen: Low Risk  (12/08/2022)  Depression (PHQ2-9): Low Risk  (12/08/2022)  Financial Resource Strain: Low Risk  (10/13/2022)  Physical Activity: Insufficiently Active (10/13/2022)  Social Connections: Unknown (10/13/2022)  Stress: No Stress Concern Present (10/13/2022)  Tobacco Use: Medium Risk (04/27/2023)    Readmission Risk Interventions     No data to display

## 2023-05-01 NOTE — TOC Progression Note (Signed)
Transition of Care [facility]) - Progression Note    Patient Details  Name: Dawn Bruce MRN: [id number] Date of Birth: 03-Jan-1946  [facility]) CM/SW Contact  Chapman Fitch, RN Phone Number: 05/01/2023, 4:14 PM  Clinical Narrative:     Home vac delivered to bedside       Expected Discharge Plan and Services                                               Social Determinants of Health (SDOH) Interventions SDOH Screenings   Food Insecurity: No Food Insecurity (04/23/2023)  Housing: Low Risk  (04/23/2023)  Transportation Needs: No Transportation Needs (04/23/2023)  Utilities: Not At Risk (04/23/2023)  Alcohol Screen: Low Risk  (12/08/2022)  Depression (PHQ2-9): Low Risk  (12/08/2022)  Financial Resource Strain: Low Risk  (10/13/2022)  Physical Activity: Insufficiently Active (10/13/2022)  Social Connections: Unknown (10/13/2022)  Stress: No Stress Concern Present (10/13/2022)  Tobacco Use: Medium Risk (04/27/2023)    Readmission Risk Interventions     No data to display

## 2023-05-01 NOTE — Progress Notes (Signed)
PROGRESS NOTE  Dawn Bruce    DOB: 06/20/1945, 77 y.o.  KGM:[id number]    Code Status: Full Code   DOA: 04/22/2023   LOS: 9  Brief hospital course  Dawn Bruce is a 77 y.o. female with medical history significant of dCHF, CAD (s/p of recent DES), A fib on Eliquis, HTN, HLD, CDK-3a, chronic pain syndrome, multiple skin cancer, perforated appendicitis, former smoker, who presents with right groin pain.   Patient was recently hospitalized from 11/21 - 11/29 due to non-STEMI.  Patient is s/p of LHC with DES placement. Pt developed retroperitoneal hematoma & hemorrhagic shock due to complication of Iliac artery laceration during left heart cath of right groin. Pt underwent surgical repair of external iliac artery by VVS on 11/23.   Presented back to ED 12/1 due to increased redness, swelling, and pain at incision site of the cath. Failed outpatient Abx x1 dose.  Hypotensive to 80/50 on presentation.    ED Course:  UYQ:[id number] development of moderate size left pleural effusion with probable underlying atelectasis or infiltrate. CT-abd/pelvis: Moderate sized right retroperitoneal hematoma is noted which extends into right pelvic region and displaces urinary bladder to the left. The pelvic component appears to be mildly enlarged compared to prior exam. Bilateral pleural effusions are noted, left greater than right, with associated atelectasis of the lower lobes. Likely from her HF.   12/2: pulmonology, and IR consulted to address pleural effusion. ID consulted to address abx course. Transitioned to linezolid. Vascular evaluated and recommended debridement. 12/3: Thoracentesis removed 1.6L left side 12/4:stable ORA, afebrile. Wound clinically greatly improved. Debridement today. Plan to stop heparin gtt and return to eliquis + DAPT s/p procedure 12/6: additional debridement.  Debridement again 12/10. Now on zosyn. Doing well. Respiratory status stable ORA.   Assessment & Plan  Principal  Problem:   Cellulitis of right groin Active Problems:   Sepsis (HCC)   CAD (coronary artery disease)   Peritoneal hematoma   Essential hypertension   Chronic diastolic heart failure (HCC)   Hypokalemia   Chronic kidney disease, stage 3a (HCC)   Pleural effusion   Abnormal LFTs   Atrial fibrillation, chronic (HCC)   Diarrhea   Chronic pain syndrome   HLD (hyperlipidemia)   HFrEF (heart failure with reduced ejection fraction) (HCC)   Severe sepsis (HCC)   Infected wound   Cellulitis of right thigh  Sepsis due to cellulitis of right groin: sepsis criteria met on admission with WBC 21.3, heart rate 104, RR 29.  Lactic acid normal 1.7.  infection source is superficial groin. WBC improving, she remains afebrile.[place] in place BxCx NGTD. Wound cultures have multiple species. Sensitivities returned. - ID following  - linezolid + cefepime - follow VSS. Debridement with wound vac placement 12/4. Additional wash out 12/6. Planning another one 12/10. - PT recommending HH - analgesia PRN - family requested hyperbaric chamber but this is not available  Hypervolemia  Bilateral Pleural effusions- incidental findings- L worse than R. Denies respiratory symptoms. Had recent echo showing HFrEF. Stable ORA. Of note, had thoracentesis 9/22. Thoracentesis 12/3 L chest 1.6L removed. Stable ORA - pulmonology consulted, IR for thoracentesis.   -diuresis  - watch respiratory status closely - trial of IV lasix helped her edema.    Hx of CAD: s/p of recent DES. - restarted plavix 12/5. Was not held prior to second debridement -close monitoring of chest pain development.    Peritoneal hematoma: Hgb mild drop 10.3>>>9.2>8.7.Blood loss from wound is minimal.  - transfused  1 unit pRBC 12/5 and hgb now stable above 9 - closely monitor hematoma -Follow-up CBC am - transfusion threshold is <8   Essential hypertension: Blood pressure stable -Continue Coreg which is also for A-fib - holding other  home meds for transient hypotension   HFrEF- EF 25-30% on echo 11/24. Considerable edema of all extremities has greatly improved -Watch volume status closely s/p trial of IV diuretics   Hypokalemia: Potassium 3.3>3.4>3.9.  Magnesium 2.3, phosphorus 2.7 -Repleted potassium   Chronic kidney disease, stage 3a (HCC): Stable. Cr 1.02>1.14>1.72 -Follow-up with BMP am    Abnormal LFTs: her liver function has been improving.  She has ALP 80, AST 54, ALT 83, total bilirubin 2.8, which is better than recent liver function and have now normalized   Atrial fibrillation, chronic (HCC): rate controlled -Switch back to eliquis -Continue Coreg   Diarrhea: C. difficile negative. -As needed Imodium   Chronic pain syndrome -Continue home fentanyl patch and Lyrica   HLD: -lipitor and zetia  Body mass index is 26.8 kg/m.  VTE ppx: apixaban (ELIQUIS) tablet 2.5 mg Start: 04/26/23 2000heparin gttapixaban (ELIQUIS) tablet 2.5 mg   Diet:     Diet   Diet NPO time specified Except for: Sips with Meds   Consultants: Vascular surgery ID Pulmonology IR  Subjective 05/01/23    Pt reports feeling well today. She got better sleep and pain is better managed. Breathing is stable. Anticipating going home.   Objective   Vitals:   04/30/23 2110 05/01/23 0433 05/01/23 0500 05/01/23 0704  BP:  (!) 169/69  (!) 181/73  Pulse:  96  95  Resp:  16  16  Temp:  98.9 F (37.2 C)  98.2 F (36.8 C)  TempSrc:  Axillary  Temporal  SpO2: 91% 96%  99%  Weight:   68.6 kg   Height:        Intake/Output Summary (Last 24 hours) at 05/01/2023 0817 Last data filed at 05/01/2023 0600 Gross per 24 hour  Intake 233.33 ml  Output 2800 ml  Net -2566.67 ml   Filed Weights   04/29/23 0500 04/30/23 0214 05/01/23 0500  Weight: 72.5 kg 71.4 kg 68.6 kg     Physical Exam:  General: awake, alert, NAD HEENT: atraumatic, clear conjunctiva, anicteric sclera, MMM, hearing grossly normal Respiratory: normal  respiratory effort. Cardiovascular: quick capillary refill, normal S1/S2, RRR, no JVD, murmurs Gastrointestinal: soft, NT, ND Nervous: A&O x3. no gross focal neurologic deficits, normal speech Extremities: 1+ pitting edema of all extremities Skin: R groin- wound vac in place and effective. No erythema or drainage appreciated.  Psychiatry: normal mood, congruent affect  Labs   I have personally reviewed the following labs and imaging studies CBC    Component Value Date/Time   WBC 8.9 05/01/2023 0456   RBC 3.09 (L) 05/01/2023 0456   HGB 8.7 (L) 05/01/2023 0456   HGB 13.6 06/01/2022 0000   HCT 26.7 (L) 05/01/2023 0456   HCT 40.5 06/01/2022 0000   PLT 226 05/01/2023 0456   PLT 132 (L) 06/01/2022 0000   MCV 86.4 05/01/2023 0456   MCV 90 06/01/2022 0000   MCH 28.2 05/01/2023 0456   MCHC 32.6 05/01/2023 0456   RDW 16.4 (H) 05/01/2023 0456   RDW 12.8 06/01/2022 0000   LYMPHSABS 0.7 04/22/2023 1528   LYMPHSABS 0.7 08/30/2021 0917   MONOABS 1.2 (H) 04/22/2023 1528   EOSABS 0.2 04/22/2023 1528   EOSABS 0.0 08/30/2021 0917   BASOSABS 0.0 04/22/2023 1528   BASOSABS  0.0 08/30/2021 0917      Latest Ref Rng & Units 05/01/2023    4:56 AM 04/30/2023    4:30 PM 04/26/2023    2:16 AM  BMP  Glucose 70 - 99 mg/dL 161  096  045   BUN 8 - 23 mg/dL 43  38  20   Creatinine 0.44 - 1.00 mg/dL 4.09  8.11  9.14   Sodium 135 - 145 mmol/L 138  137  136   Potassium 3.5 - 5.1 mmol/L 3.9  4.1  3.4   Chloride 98 - 111 mmol/L 107  110  106   CO2 22 - 32 mmol/L 25  18  24    Calcium 8.9 - 10.3 mg/dL 8.7  8.4  8.0     DG Chest Port 1 View  Result Date: 04/29/2023 CLINICAL DATA:  Short of breath EXAM: PORTABLE CHEST 1 VIEW COMPARISON:  04/24/2023 FINDINGS: Single frontal view of the chest demonstrates right-sided PICC tip overlying superior vena cava. There are increased bibasilar veiling opacities, which obscure the cardiac silhouette. No pneumothorax. No acute bony abnormalities. IMPRESSION: 1. Worsening  bibasilar veiling opacities, consistent with enlarging pleural effusions and progressive basilar consolidation. Electronically Signed   By: Sharlet Salina M.D.   On: 04/29/2023 17:32    Disposition Plan & Communication  Patient status: Inpatient  Admitted From: Home Planned disposition location: Home Anticipated discharge date: TBD pending clinical improvement  Family Communication: spouse and daughter at bedside   Author: Leeroy Bock, DO [facility] 05/01/2023, 8:17 AM   Available by Epic secure chat 7AM-7PM. If 7PM-7AM, please contact night-coverage.  [facility] contact information found on [email or web address].

## 2023-05-01 NOTE — Op Note (Signed)
    OPERATIVE NOTE   PROCEDURE: Excisional debridement right groin wound Placement of a nondisposable VAC wound dressing right groin  PRE-OPERATIVE DIAGNOSIS: Right groin wound infection status post pseudoaneurysm repair secondary to cardiac catheterization  POST-OPERATIVE DIAGNOSIS: Same  SURGEON: [name]  ASSISTANT(S):  Rolla Plate, NP   ANESTHESIA: MAC  ESTIMATED BLOOD LOSS: <5 cc  FINDING(S): Granulation bed is increasing small amount of devitalized tissue  SPECIMEN(S): None sent  INDICATIONS:   Dawn Bruce is a 77 y.o. female who presents with right groin wound secondary to a cardiac catheterization and subsequent pseudoaneurysm repair.  She has been undergoing serial debridements with wound VAC changes in the operating room.  Risks and benefits are reviewed all questions answered patient agrees to proceed..  DESCRIPTION: After full informed written consent was obtained from the patient, the patient was brought back to the operating room and placed supine upon the operating table.  Prior to induction, the patient received IV antibiotics.   After obtaining adequate anesthesia, the patient was then prepped and draped in the standard fashion for a right groin debridement and placement of a VAC dressing.  The existing VAC dressing is removed.  Devitalized tissue is debrided with a curette as well as Metzenbaum scissors.  The wound was then inspected for hemostasis.  Wound is measured and it is 16 cm in length by 8 cm in width and 4 cm in depth.  This is 128 cm.  A standard VAC sponge was then trimmed to the appropriate size and a VAC dressing applied.  Good seal is noted in the operating room.   The patient tolerated this procedure well.   COMPLICATIONS: None  CONDITION: [facility]  Office: [phone]   05/01/2023, 8:08 AM

## 2023-05-01 NOTE — Anesthesia Preprocedure Evaluation (Signed)
Anesthesia Evaluation  Patient identified by MRN, date of birth, ID band Patient awake    Reviewed: Allergy & Precautions, NPO status , Patient's Chart, lab work & pertinent test results  History of Anesthesia Complications Negative for: history of anesthetic complications  Airway Mallampati: IV   Neck ROM: Full    Dental no notable dental hx.    Pulmonary former smoker Pleural effusion s/p thoracentesis yielding 1.6 L fluid 04/24/23   Pulmonary exam normal breath sounds clear to auscultation       Cardiovascular hypertension, + CAD (s/p MI 04/12/23 and stents on Plavix), + Peripheral Vascular Disease (s/p CEA) and +CHF (EF 25-30%)  Normal cardiovascular exam+ dysrhythmias (a fib on Eliquis)  Rhythm:Regular Rate:Normal  ECG 04/22/23: a flutter; T abnormality; prolonged QT; no STEMI   Neuro/Psych Chronic pain    GI/Hepatic ,GERD  ,,  Endo/Other  negative endocrine ROS    Renal/GU Renal disease (stage III CKD)     Musculoskeletal   Abdominal   Peds  Hematology negative hematology ROS (+)   Anesthesia Other Findings   Reproductive/Obstetrics                             Anesthesia Physical Anesthesia Plan  ASA: 4  Anesthesia Plan: General   Post-op Pain Management:    Induction: Intravenous  PONV Risk Score and Plan: 3 and Ondansetron, Dexamethasone, Treatment may vary due to age or medical condition, Propofol infusion and TIVA  Airway Management Planned: Natural Airway  Additional Equipment:   Intra-op Plan:   Post-operative Plan:   Informed Consent: I have reviewed the patients History and Physical, chart, labs and discussed the procedure including the risks, benefits and alternatives for the proposed anesthesia with the patient or authorized representative who has indicated his/her understanding and acceptance.     Dental advisory given  Plan Discussed with:  CRNA  Anesthesia Plan Comments: (Returns for repeat I&D.  Did well with GA with native airway, plan for that today. LMA/GETA backup discussed.  Patient consented for risks of anesthesia including but not limited to:  - adverse reactions to medications - damage to eyes, teeth, lips or other oral mucosa - nerve damage due to positioning  - sore throat or hoarseness - damage to heart, brain, nerves, lungs, other parts of body or loss of life  Informed patient about role of CRNA in peri- and intra-operative care.  Patient voiced understanding.)        Anesthesia Quick Evaluation

## 2023-05-01 NOTE — Transfer of Care (Signed)
Immediate Anesthesia Transfer of Care Note  Patient: Dawn Bruce  Procedure(s) Performed: IRRIGATION AND DEBRIDEMENT WOUND (Right: Groin) APPLICATION OF WOUND VAC (Right: Groin)  Patient Location: PACU  Anesthesia Type:General  Level of Consciousness: drowsy  Airway & Oxygen Therapy: Patient Spontanous Breathing and Patient connected to face mask oxygen  Post-op Assessment: Report given to RN  Post vital signs: stable  Last Vitals:  Vitals Value Taken Time  BP    Temp    Pulse    Resp    SpO2      Last Pain:  Vitals:   05/01/23 0704  TempSrc: Temporal  PainSc: 5       Patients Stated Pain Goal: 0 (04/28/23 0858)  Complications: No notable events documented.

## 2023-05-01 NOTE — Anesthesia Postprocedure Evaluation (Signed)
Anesthesia Post Note  Patient: Dawn Bruce  Procedure(s) Performed: IRRIGATION AND DEBRIDEMENT WOUND (Right: Groin) APPLICATION OF WOUND VAC (Right: Groin)  Patient location during evaluation: PACU Anesthesia Type: General Level of consciousness: awake and alert, oriented and patient cooperative Pain management: pain level controlled Vital Signs Assessment: post-procedure vital signs reviewed and stable Respiratory status: spontaneous breathing, nonlabored ventilation and respiratory function stable Cardiovascular status: blood pressure returned to baseline and stable Postop Assessment: adequate PO intake Anesthetic complications: no   No notable events documented.   Last Vitals:  Vitals:   05/01/23 0845 05/01/23 0917  BP: (!) 128/52 (!) 144/62  Pulse: 96 97  Resp: 12 18  Temp:  36.5 C  SpO2: 92% 92%    Last Pain:  Vitals:   05/01/23 0917  TempSrc: Oral  PainSc:                  Reed Breech

## 2023-05-01 NOTE — Plan of Care (Signed)

## 2023-05-01 NOTE — Interval H&P Note (Signed)
History and Physical Interval Note:  05/01/2023 7:28 AM  Dawn Bruce  has presented today for surgery, with the diagnosis of Right Groin post operative infection.  The various methods of treatment have been discussed with the patient and family. After consideration of risks, benefits and other options for treatment, the patient has consented to  Procedure(s): IRRIGATION AND DEBRIDEMENT WOUND (Right) APPLICATION OF WOUND VAC (Right) as a surgical intervention.  The patient's history has been reviewed, patient examined, no change in status, stable for surgery.  I have reviewed the patient's chart and labs.  Questions were answered to the patient's satisfaction.     [name]

## 2023-05-02 ENCOUNTER — Encounter: Payer: Self-pay | Admitting: Vascular Surgery

## 2023-05-02 DIAGNOSIS — N1831 Chronic kidney disease, stage 3a: Secondary | ICD-10-CM | POA: Diagnosis not present

## 2023-05-02 DIAGNOSIS — E663 Overweight: Secondary | ICD-10-CM | POA: Insufficient documentation

## 2023-05-02 DIAGNOSIS — N179 Acute kidney failure, unspecified: Secondary | ICD-10-CM

## 2023-05-02 DIAGNOSIS — L03314 Cellulitis of groin: Secondary | ICD-10-CM | POA: Diagnosis not present

## 2023-05-02 DIAGNOSIS — E872 Acidosis, unspecified: Secondary | ICD-10-CM | POA: Insufficient documentation

## 2023-05-02 DIAGNOSIS — I5020 Unspecified systolic (congestive) heart failure: Secondary | ICD-10-CM | POA: Diagnosis not present

## 2023-05-02 LAB — CBC
HCT: 23.5 % — ABNORMAL LOW (ref 36.0–46.0)
Hemoglobin: 7.6 g/dL — ABNORMAL LOW (ref 12.0–15.0)
MCH: 28.6 pg (ref 26.0–34.0)
MCHC: 32.3 g/dL (ref 30.0–36.0)
MCV: 88.3 fL (ref 80.0–100.0)
Platelets: 166 10*3/uL (ref 150–400)
RBC: 2.66 MIL/uL — ABNORMAL LOW (ref 3.87–5.11)
RDW: 16.3 % — ABNORMAL HIGH (ref 11.5–15.5)
WBC: 8 10*3/uL (ref 4.0–10.5)
nRBC: 0 % (ref 0.0–0.2)

## 2023-05-02 LAB — BASIC METABOLIC PANEL
Anion gap: 8 (ref 5–15)
BUN: 55 mg/dL — ABNORMAL HIGH (ref 8–23)
CO2: 23 mmol/L (ref 22–32)
Calcium: 8.4 mg/dL — ABNORMAL LOW (ref 8.9–10.3)
Chloride: 108 mmol/L (ref 98–111)
Creatinine, Ser: 2.78 mg/dL — ABNORMAL HIGH (ref 0.44–1.00)
GFR, Estimated: 17 mL/min — ABNORMAL LOW (ref >60)
Glucose, Bld: 120 mg/dL — ABNORMAL HIGH (ref 70–99)
Potassium: 4.5 mmol/L (ref 3.5–5.1)
Sodium: 139 mmol/L (ref 135–145)

## 2023-05-02 LAB — FERRITIN: Ferritin: 620 ng/mL — ABNORMAL HIGH (ref 11–307)

## 2023-05-02 LAB — IRON AND TIBC
Iron: 147 ug/dL (ref 28–170)
Saturation Ratios: 87 % — ABNORMAL HIGH (ref 10.4–31.8)
TIBC: 169 ug/dL — ABNORMAL LOW (ref 250–450)
UIBC: 22 ug/dL

## 2023-05-02 LAB — AEROBIC/ANAEROBIC CULTURE W GRAM STAIN (SURGICAL/DEEP WOUND)

## 2023-05-02 LAB — VITAMIN B12: Vitamin B-12: 397 pg/mL (ref 180–914)

## 2023-05-02 LAB — PREPARE RBC (CROSSMATCH)

## 2023-05-02 MED ORDER — PREGABALIN 75 MG PO CAPS
75.0000 mg | ORAL_CAPSULE | Freq: Two times a day (BID) | ORAL | Status: DC
  Administered 2023-05-02 – 2023-05-04 (×4): 75 mg via ORAL
  Filled 2023-05-02 (×4): qty 1

## 2023-05-02 MED ORDER — PIPERACILLIN-TAZOBACTAM IN DEX 2-0.25 GM/50ML IV SOLN
2.2500 g | Freq: Four times a day (QID) | INTRAVENOUS | Status: DC
  Administered 2023-05-02 – 2023-05-04 (×8): 2.25 g via INTRAVENOUS
  Filled 2023-05-02 (×10): qty 50

## 2023-05-02 MED ORDER — LACTATED RINGERS IV SOLN: INTRAVENOUS | Status: DC

## 2023-05-02 MED ORDER — MIDODRINE HCL 5 MG PO TABS
2.5000 mg | ORAL_TABLET | Freq: Three times a day (TID) | ORAL | Status: AC
  Administered 2023-05-02 – 2023-05-03 (×3): 2.5 mg via ORAL
  Filled 2023-05-02 (×3): qty 1

## 2023-05-02 MED ORDER — SODIUM CHLORIDE 0.9 % IV BOLUS
500.0000 mL | Freq: Once | INTRAVENOUS | Status: AC
  Administered 2023-05-02: 500 mL via INTRAVENOUS

## 2023-05-02 MED ORDER — SODIUM CHLORIDE 0.9% IV SOLUTION
Freq: Once | INTRAVENOUS | Status: AC
Start: 2023-05-02 — End: 2023-05-03

## 2023-05-02 MED ORDER — MIDODRINE HCL 5 MG PO TABS: 5.0000 mg | ORAL_TABLET | Freq: Three times a day (TID) | ORAL | Status: DC

## 2023-05-02 NOTE — Progress Notes (Signed)
PHARMACY NOTE:  ANTIMICROBIAL RENAL DOSAGE ADJUSTMENT  Current antimicrobial regimen includes a mismatch between antimicrobial dosage and estimated renal function.  As per policy approved by the Pharmacy & Therapeutics and Medical Executive Committees, the antimicrobial dosage will be adjusted accordingly.  Current antimicrobial dosage:  piperacillin/tazobactam 3.375gm IV q8h over 4h infusion  Indication: groin wound (polymicrobial)  Renal Function:  Estimated Creatinine Clearance: 15.3 mL/min (A) (by C-G formula based on SCr of 2.78 mg/dL (H)). []      On intermittent HD, scheduled: []      On CRRT    Antimicrobial dosage has been changed to:  piperacillin/tazobactam 2.25gm IV q6h  Additional comments: Piperacillin/tazobactam can cause a "pseudo-nephrotoxicity" due to its inhibition of creatinine secretion into urine leading to increase in Serum creatinine level.  From notes there is also concern for pre-renal AKI related to diuresis (loop and spironolactone held).    Thank you for allowing pharmacy to be a part of this patient's care.  Juliette Alcide, PharmD, BCPS, BCIDP Work Cell: [phone] 05/02/2023 8:04 AM

## 2023-05-02 NOTE — Progress Notes (Signed)
Progress Note   Patient: Dawn Bruce QIH:[id number] DOB: [birth year] DOA: 04/22/2023     10 DOS: the patient was seen and examined on 05/02/2023   Brief hospital course: Katrinia Lindenbaum is a 77 y.o. female with medical history significant of dCHF, CAD (s/p of recent DES), A fib on Eliquis, HTN, HLD, CDK-3a, chronic pain syndrome, multiple skin cancer, perforated appendicitis, former smoker, who presents with right groin pain.  Patient was recently hospitalized from 11/21 - 11/29 due to non-STEMI. Patient is s/p of LHC with DES placement. Pt developed retroperitoneal hematoma & hemorrhagic shock due to complication of Iliac artery laceration during left heart cath of right groin. Pt underwent surgical repair of external iliac artery by VVS on 11/23.  Presented back to ED 12/1 due to increased redness, swelling, and pain at incision site of the cath. Failed outpatient Abx x1 dose.  Hypotensive to 80/50 on presentation. 12/2: pulmonology, and IR consulted to address pleural effusion. ID consulted to address abx course. Transitioned to linezolid. Vascular evaluated and recommended debridement. 12/3: Thoracentesis removed 1.6L left side 12/4:stable ORA, afebrile. Wound clinically greatly improved. Debridement today. Plan to stop heparin gtt and return to eliquis + DAPT s/p procedure 12/6: additional debridement.  Debridement again 12/10. Now on zosyn. Doing well. Respiratory status stable ORA.    Principal Problem:   Cellulitis of right groin Active Problems:   Sepsis (HCC)   CAD (coronary artery disease)   Peritoneal hematoma   Essential hypertension   Chronic diastolic heart failure (HCC)   Hypokalemia   Chronic kidney disease, stage 3a (HCC)   Pleural effusion   Abnormal LFTs   Atrial fibrillation, chronic (HCC)   Diarrhea   Chronic pain syndrome   HLD (hyperlipidemia)   HFrEF (heart failure with reduced ejection fraction) (HCC)   Severe sepsis (HCC)   Infected wound   Cellulitis of  right thigh   Acute kidney injury superimposed on stage 3a chronic kidney disease (HCC)   Assessment and Plan:  Sepsis due to cellulitis of right groin Right groin wound infection secondary to surgical repair of pseudoaneurysm. Sepsis criteria met on admission with WBC 21.3, heart rate 104, RR 29.  Lactic acid normal 1.7.   Sepsis appears to be secondary to right groin surgical wound infection.  Patient had debridement x 2, wound-vac in place Wound culture grow E. coli, Morganella morganii, Proteus Morales, Enterococcus faecalis.  Blood culture negative.  Initially treated with linezolid + cefepime, switched to Zosyn on 12/11.  Patient has been followed by ID.   Hypervolemia  Bilateral Pleural effusions Acute on chronic systolic congestive heart failure.  Hx of CAD: s/p of recent DES. Patient initially received IV Lasix, also had a thoracentesis. Volume status has improved. Also restarted the Plavix.  Acute renal failure on chronic kidney disease stage IIIa. Hypokalemia. Patient has much worsening renal function with creatinine increased to 2.78 from 1.08 on 12/5.  This could be secondary to diuretics.  Currently is not on any diuretics, will start gentle rehydration. Also discontinue NSAIDs.  Recheck renal function tomorrow.  Peritoneal hematoma Anemia acute on chronic. Hemoglobin dropped down to 7.6, will give another unit of PRBC transfusion.  Also check iron B12 level.   Essential hypertension: Blood pressure stable -Continue Coreg which is also for A-fib - holding other home meds for transient hypotension   Atrial fibrillation, chronic (HCC): rate controlled -Switch back to eliquis -Continue Coreg   Diarrhea: C. difficile negative. -As needed Imodium   Chronic pain syndrome -Continue  home fentanyl patch and Lyrica   HLD: -lipitor and zetia      Subjective:  Patient doing better today, denies any short of breath or chest pain.  Physical Exam: Vitals:    05/01/23 2324 05/02/23 0212 05/02/23 0752 05/02/23 0759  BP: (!) 111/58 (!) 109/53 102/60   Pulse: (!) 102 100 100   Resp: 20 20 18    Temp: 98.7 F (37.1 C) 97.6 F (36.4 C) 98 F (36.7 C)   TempSrc:  Oral Oral   SpO2: 96% 96% 94% 90%  Weight:      Height:       General exam: Appears calm and comfortable  Respiratory system: Clear to auscultation. Respiratory effort normal. Cardiovascular system: Irregular, RRR. No JVD, murmurs, rubs, gallops or clicks. No pedal edema. Gastrointestinal system: Abdomen is nondistended, soft and nontender. No organomegaly or masses felt. Normal bowel sounds heard. Central nervous system: Alert and oriented. No focal neurological deficits. Extremities: Symmetric 5 x 5 power. Skin: No rashes, lesions or ulcers Psychiatry: Judgement and insight appear normal. Mood & affect appropriate.    Data Reviewed:  Reviewed CT scan results, lab results.  Family Communication: Daughter updated at bedside.  Disposition: Status is: Inpatient Remains inpatient appropriate because: Severity of disease, IV treatment.     Time spent: 55 minutes  Author: Marrion Coy, MD 05/02/2023 12:43 PM  For on call review [email or web address].

## 2023-05-02 NOTE — Progress Notes (Addendum)
Progress Note    05/02/2023 10:59 AM 1 Day Post-Op  Subjective:   Dawn Bruce is a 77 yo female who presents to [facility] emergency room with post operative right groin infection.  Patient is now postop day 6 from right groin incision and drainage with wound washout and wound VAC placement for the third time.  Patient is resting comfortably in bed this afternoon.  Wound VAC is in place and working well.  Noted serosanguineous drainage to canister and the about 60 mL.  Patient does endorse some pain and soreness to her right groin but states it is manageable.  Patient remains on her 2.5 mg of Eliquis twice daily for her atrial fibrillation and continues on ASA 81 mg daily and Plavix 75 mg daily post surgical repair of her right femoral artery.   Vitals:   05/02/23 0752 05/02/23 0759  BP: 102/60   Pulse: 100   Resp: 18   Temp: 98 F (36.7 C)   SpO2: 94% 90%   Physical Exam: Cardiac:  Hx of Atrial Fibrillation. Regular Rate  Lungs: Bilateral lungs with diminished breath sounds bases. Mild Dyspnea with cough. No wheezing noted. Non Labored breathing this morning. No oxygen required.  Incisions:  Right groin with wound VAC in place and working well.  Maintain suction and noted 60 mL serosanguineous drainage now. Extremities:  Bilateral lower extremities warm to touch. Palpable pulses. Noted +1 edema to right lower extremity.  Abdomen:  Positive bowel sounds throughout, soft and non tender and non distended.  Neurologic: AAOX4, Follows commands.   CBC    Component Value Date/Time   WBC 8.0 05/02/2023 0550   RBC 2.66 (L) 05/02/2023 0550   HGB 7.6 (L) 05/02/2023 0550   HGB 13.6 06/01/2022 0000   HCT 23.5 (L) 05/02/2023 0550   HCT 40.5 06/01/2022 0000   PLT 166 05/02/2023 0550   PLT 132 (L) 06/01/2022 0000   MCV 88.3 05/02/2023 0550   MCV 90 06/01/2022 0000   MCH 28.6 05/02/2023 0550   MCHC 32.3 05/02/2023 0550   RDW 16.3 (H) 05/02/2023 0550   RDW 12.8 06/01/2022 0000   LYMPHSABS  0.7 04/22/2023 1528   LYMPHSABS 0.7 08/30/2021 0917   MONOABS 1.2 (H) 04/22/2023 1528   EOSABS 0.2 04/22/2023 1528   EOSABS 0.0 08/30/2021 0917   BASOSABS 0.0 04/22/2023 1528   BASOSABS 0.0 08/30/2021 0917    BMET    Component Value Date/Time   NA 139 05/02/2023 0550   NA 147 (H) 06/01/2022 0000   K 4.5 05/02/2023 0550   CL 108 05/02/2023 0550   CO2 23 05/02/2023 0550   GLUCOSE 120 (H) 05/02/2023 0550   BUN 55 (H) 05/02/2023 0550   BUN 23 06/01/2022 0000   CREATININE 2.78 (H) 05/02/2023 0550   CALCIUM 8.4 (L) 05/02/2023 0550   GFRNONAA 17 (L) 05/02/2023 0550   GFRAA 54 (L) 10/08/2019 1419    INR    Component Value Date/Time   INR 1.5 (H) 04/22/2023 1540     Intake/Output Summary (Last 24 hours) at 05/02/2023 1059 Last data filed at 05/02/2023 0300 Gross per 24 hour  Intake 124.56 ml  Output 800 ml  Net -675.44 ml     Assessment/Plan:  78 y.o. female is s/p right groin postop infection with incision and drainage of wound with wound VAC placement x 3.  1 Day Post-Op   PLAN: Dr. Vilinda Flake plans on changing the wound VAC at the patient's bedside today. Continue anticoagulation as ordered.  Continue work with PT/OT for preparation of possible discharge at the end of the week.  DVT prophylaxis: Eliquis 2.5 mg twice daily, aspirin 81 mg daily, Plavix 75 mg daily.   [name] 05/02/2023 10:59 AM

## 2023-05-02 NOTE — Hospital Course (Addendum)
Dawn Bruce is a 77 y.o. female with medical history significant of dCHF, CAD (s/p of recent DES), A fib on Eliquis, HTN, HLD, CDK-3a, chronic pain syndrome, multiple skin cancer, perforated appendicitis, former smoker, who presents with right groin pain.  Patient was recently hospitalized from 11/21 - 11/29 due to non-STEMI. Patient is s/p of LHC with DES placement. Pt developed retroperitoneal hematoma & hemorrhagic shock due to complication of Iliac artery laceration during left heart cath of right groin. Pt underwent surgical repair of external iliac artery by VVS on 11/23.  Presented back to ED 12/1 due to increased redness, swelling, and pain at incision site of the cath. Failed outpatient Abx x1 dose.  Hypotensive to 80/50 on presentation. 12/2: pulmonology, and IR consulted to address pleural effusion. ID consulted to address abx course. Transitioned to linezolid. Vascular evaluated and recommended debridement. 12/3: Thoracentesis removed 1.6L left side 12/4:stable ORA, afebrile. Wound clinically greatly improved. Debridement today. Plan to stop heparin gtt and return to eliquis + DAPT s/p procedure 12/6: additional debridement.  Debridement again 12/10. Now on zosyn. Doing well. Respiratory status stable ORA.  12/12.  Patient had a worsening renal function, nephrology consult obtained. 12/14.  Worsening thrombocytopenia, consult hematology oncology to rule out TTS. 12/15.  Thoracentesis removed 1.5 Lon left

## 2023-05-02 NOTE — Plan of Care (Signed)
  Problem: Education: Goal: Knowledge of General Education information will improve Description Including pain rating scale, medication(s)/side effects and non-pharmacologic comfort measures Outcome: Progressing   

## 2023-05-02 NOTE — Progress Notes (Signed)
PULMONOLOGY         Date: 05/02/2023,   MRN# [id number] Dawn Bruce [birth year]     AdmissionWeight: 54.4 kg                 CurrentWeight: 68.6 kg  Referring provider: Dr Gilda Crease   CHIEF COMPLAINT:   Complete atelectasis of left lung with bilateral pleural effusions.    HISTORY OF PRESENT ILLNESS   77 year old female with a history of chronic GERD, basal cell carcinoma squamous cell carcinoma history of NSTEMI diastolic CHF, CAD status post recent PCI with DES, chronic A-fib on Eliquis, essential hypertension, dyslipidemia CKD 3A chronic pain syndrome remote perforated appendicitis, hospitalized November 2024 with non-STEMI.  Post left heart cath had developed retroperitoneal hematoma and hemorrhagic shock with complications of iliac artery laceration.  Status post surgical repair via vascular surgery service.  Had outpatient therapy with antibiotics with failure came in with transient hypotension and circulatory shock.  Found to have elevated cardiac biomarkers and acute on chronic CHF.  Vascular surgery was reconsulted today and PCCM consultation was placed to incidental finding of complete atelectasis of the left lung with surrounding bilateral pleural effusions worse on the left.  04/25/23- patient evaluated in PACU post op , appears to be mildy sedated still. Mildy hypotensive at this moment.  Lung sounds much improved post 1.6L left pleural space aspiration.  04/26/23- patient seen and examined , [name] (daughter) at bedside.  Patient receiving blood transfusion.  Lung sounds are clear , vitals are stable.  Hypoalbulminemia continues. Will order urine protein and pre albumin levels,  because patient shares she's eating well.  CXR in am to check interval effusion.  04/27/23- patient seen at bedside, husband and daughter present.  For surgery wound washout and vac exchange today.   Vitals stable , room air spO2 99%     04/30/23- patient seen at bedside.  Has  additional surgery planned with debridement.  CXR with recurring effusion and associated compressive atelelctasis.   Ideally would like to increase diuresis and recruitment maunevers.  E. Faecallis noted on wound culture.  I discussed case with Dr Avon Gully infectious disease specialist today.  Dr Dareen Piano is also considering pleurX catheter which patient may need if this does not resolve with medical management. She already had multiple thoracentesis. No noew labs are available for review but are in process. Will order purewick for urination due to physical weakness.      05/01/23-  patient reports improvement post diuresis.  Will obtain interval CXR today.  We discussed PleurX permanent tunneled catheter and she declined to have this done at the moment.  She is on room air with normoxia.  She developed mild contraction and we will take diuretic holiday.   05/15/23- patient with severe hypotension.  I have dcd her beta blocker today and imdur. She is now getting IV fluids and is able to speak.  Will start midodrine 5mg  po tid.   PAST MEDICAL HISTORY   Past Medical History:  Diagnosis Date   Actinic keratosis    Chronic pain    GERD (gastroesophageal reflux disease) ?   Taking Pepcid   History of basal cell carcinoma (BCC) 05/02/2021   right upper back paraspinal   History of SCC (squamous cell carcinoma) of skin 09/10/2019   left dorsum wrist ED&C done 10/28/19   History of SCC (squamous cell carcinoma) of skin 03/04/2020   right dorsum hand   History of squamous cell carcinoma in situ (  SCCIS) 03/04/2020   left dorsum wrist  ED&C   History of squamous cell carcinoma in situ (SCCIS) 03/04/2020   right medial infraorbital  ED&C 04/28/2020   History of squamous cell carcinoma in situ (SCCIS) 10/28/2019   right dorsum hand proximal lateral   History of squamous cell carcinoma in situ (SCCIS) 10/28/2019   right dorsum hand proximal medial   Hyperlipidemia    Hypertension    NSTEMI (non-ST  elevated myocardial infarction) (HCC) 04/12/2023   Renal disorder    Squamous cell carcinoma in situ 10/31/2021   left distal  tricep, EDC   Squamous cell carcinoma of skin 08/28/2022   Right volar forearm - [facility]     SURGICAL HISTORY   Past Surgical History:  Procedure Laterality Date   ABDOMINAL HYSTERECTOMY     APPENDECTOMY  April 2023   Burst appendix   APPLICATION OF WOUND VAC Right 04/25/2023   Procedure: APPLICATION OF WOUND VAC;  Surgeon: Renford Dills, MD;  Location: [facility] ORS;  Service: Vascular;  Laterality: Right;   APPLICATION OF WOUND VAC Right 04/27/2023   Procedure: WOUND VAC EXCHANGE;  Surgeon: Renford Dills, MD;  Location: [facility] OR[facility];  Service: Vascular;  Laterality: Right;   APPLICATION OF WOUND VAC Right 05/01/2023   Procedure: APPLICATION OF WOUND VAC;  Surgeon: Renford Dills, MD;  Location: [facility] OR[facility];  Service: Vascular;  Laterality: Right;   AUGMENTATION MAMMAPLASTY Bilateral    25-30 years ago   CARDIOVERSION N/A 12/01/2020   Procedure: CARDIOVERSION;  Surgeon: Debbe Odea, MD;  Location: [facility] OR[facility];  Service: Cardiovascular;  Laterality: N/A;   CAROTID ARTERY ANGIOPLASTY Right 1995(approximate)   CAROTID ENDARTERECTOMY  2001   CORONARY STENT INTERVENTION N/A 04/13/2023   Procedure: CORONARY STENT INTERVENTION;  Surgeon: Marcina Millard, MD;  Location: [facility];  Service: Cardiovascular;  Laterality: N/A;   COSMETIC SURGERY     ELBOW SURGERY Left    ENDARTERECTOMY FEMORAL Right 04/13/2023   Procedure: Right groin exploration with repair of right external iliac artery and right circumflex artery;  Surgeon: Bertram Denver, MD;  Location: [facility] OR[facility];  Service: Vascular;  Laterality: Right;   FRACTURE SURGERY  2016   Arm   INCISION AND DRAINAGE OF WOUND Right 04/25/2023   Procedure: IRRIGATION AND DEBRIDEMENT WOUND;  Surgeon: Renford Dills, MD;  Location: [facility] OR[facility];  Service: Vascular;  Laterality: Right;   INCISION AND DRAINAGE  OF WOUND Right 04/27/2023   Procedure: IRRIGATION AND DEBRIDEMENT WOUND;  Surgeon: Renford Dills, MD;  Location: [facility];  Service: Vascular;  Laterality: Right;   INCISION AND DRAINAGE OF WOUND Right 05/01/2023   Procedure: IRRIGATION AND DEBRIDEMENT WOUND;  Surgeon: Renford Dills, MD;  Location: [facility];  Service: Vascular;  Laterality: Right;   LEFT HEART CATH AND CORONARY ANGIOGRAPHY N/A 04/13/2023   Procedure: LEFT HEART CATH AND CORONARY ANGIOGRAPHY;  Surgeon: Marcina Millard, MD;  Location: [facility];  Service: Cardiovascular;  Laterality: N/A;   THORACENTESIS N/A 12/27/2020   Procedure: Alanson Puls;  Surgeon: Josephine Igo, DO;  Location: [facility];  Service: Pulmonary;  Laterality: N/A;   TONSILLECTOMY     TUBAL LIGATION  ?     FAMILY HISTORY   Family History  Problem Relation Age of Onset   Cancer Mother    Cancer Father    Cancer Sister    Breast cancer Neg Hx      SOCIAL HISTORY   Social History   Tobacco Use  Smoking status: Former    Current packs/day: 0.00    Average packs/day: 0.3 packs/day for 15.0 years (3.8 ttl pk-yrs)    Types: Cigarettes    Start date: 78    Quit date: 35    Years since quitting: 34.9    Passive exposure: Past   Smokeless tobacco: Never   Tobacco comments:    quit around 1990-1995 (?)  Vaping Use   Vaping status: Never Used  Substance Use Topics   Alcohol use: Yes    Alcohol/week: 14.0 standard drinks of alcohol    Types: 14 Standard drinks or equivalent per week    Comment: 2 scotch shots   Drug use: Never     MEDICATIONS    Home Medication:     Current Medication:  Current Facility-Administered Medications:    acetaminophen (TYLENOL) tablet 650 mg, 650 mg, Oral, Q6H PRN, Schnier, Latina Craver, MD, 650 mg at 05/01/23 1723   albuterol (PROVENTIL) (2.5 MG/3ML) 0.083% nebulizer solution 2.5 mg, 2.5 mg, Nebulization, Q4H PRN, Schnier, Latina Craver, MD, 2.5 mg at 05/02/23 0759   alum &  mag hydroxide-simeth (MAALOX/MYLANTA) 200-200-20 MG/5ML suspension 15 mL, 15 mL, Oral, Once, Schnier, Latina Craver, MD   apixaban Everlene Balls) tablet 2.5 mg, 2.5 mg, Oral, BID, Schnier, Latina Craver, MD, 2.5 mg at 05/01/23 2116   aspirin EC tablet 81 mg, 81 mg, Oral, Daily, Schnier, Latina Craver, MD, 81 mg at 05/01/23 1052   atorvastatin (LIPITOR) tablet 80 mg, 80 mg, Oral, Daily, Schnier, Latina Craver, MD, 80 mg at 05/01/23 1052   carvedilol (COREG) tablet 25 mg, 25 mg, Oral, BID WC, Leeroy Bock, MD   Chlorhexidine Gluconate Cloth 2 % PADS 6 each, 6 each, Topical, Daily, Schnier, Latina Craver, MD, 6 each at 05/01/23 1053   clopidogrel (PLAVIX) tablet 75 mg, 75 mg, Oral, Daily, Schnier, Latina Craver, MD, 75 mg at 05/01/23 1052   ezetimibe (ZETIA) tablet 10 mg, 10 mg, Oral, Daily, Schnier, Latina Craver, MD, 10 mg at 05/01/23 1052   fentaNYL (DURAGESIC) 75 MCG/HR 1 patch, 1 patch, Transdermal, Q72H, Schnier, Latina Craver, MD, 1 patch at 04/29/23 1511   ibuprofen (ADVIL) tablet 200 mg, 200 mg, Oral, Q6H PRN, Schnier, Latina Craver, MD, 200 mg at 04/22/23 2254   isosorbide mononitrate (IMDUR) 24 hr tablet 60 mg, 60 mg, Oral, Daily, Schnier, Latina Craver, MD, 60 mg at 05/01/23 1052   loperamide (IMODIUM) capsule 2 mg, 2 mg, Oral, BID PRN, Schnier, Latina Craver, MD   oxyCODONE (Oxy IR/ROXICODONE) immediate release tablet 5 mg, 5 mg, Oral, Q6H PRN, Schnier, Latina Craver, MD, 5 mg at 05/01/23 1053   pantoprazole (PROTONIX) EC tablet 40 mg, 40 mg, Oral, Daily, Schnier, Latina Craver, MD, 40 mg at 05/01/23 1052   phenylephrine ((USE for PREPARATION-H)) 0.25 % suppository 1 suppository, 1 suppository, Rectal, PRN, Schnier, Latina Craver, MD   piperacillin-tazobactam (ZOSYN) IVPB 2.25 g, 2.25 g, Intravenous, Q6H, Zeigler, Dustin G, RPH   pregabalin (LYRICA) capsule 150 mg, 150 mg, Oral, BID, Schnier, Latina Craver, MD, 150 mg at 05/01/23 2116   sodium chloride flush (NS) 0.9 % injection 10-40 mL, 10-40 mL, Intracatheter, Q12H, Schnier, Latina Craver, MD, 10  mL at 05/01/23 1053   sodium chloride flush (NS) 0.9 % injection 10-40 mL, 10-40 mL, Intracatheter, PRN, Schnier, Latina Craver, MD   traZODone (DESYREL) tablet 50 mg, 50 mg, Oral, QHS, Schnier, Latina Craver, MD, 50 mg at 05/01/23 2116    ALLERGIES   Patient has no known allergies.  REVIEW OF SYSTEMS    Review of Systems:  Gen:  Denies  fever, sweats, chills weigh loss  HEENT: Denies blurred vision, double vision, ear pain, eye pain, hearing loss, nose bleeds, sore throat Cardiac:  No dizziness, chest pain or heaviness, chest tightness,edema Resp:   reports dyspnea chronically  Gi: Denies swallowing difficulty, stomach pain, nausea or vomiting, diarrhea, constipation, bowel incontinence Gu:  Denies bladder incontinence, burning urine Ext:   Denies Joint pain, stiffness or swelling Skin: Denies  skin rash, easy bruising or bleeding or hives Endoc:  Denies polyuria, polydipsia , polyphagia or weight change Psych:   Denies depression, insomnia or hallucinations   Other:  All other systems negative   VS: BP 102/60 (BP Location: Right Arm)   Pulse 100   Temp 98 F (36.7 C) (Oral)   Resp 18   Ht 5' 2.99" (1.6 m)   Wt 68.6 kg   SpO2 90%   BMI 26.80 kg/m      PHYSICAL EXAM    GENERAL:NAD, no fevers, chills, no weakness no fatigue HEAD: Normocephalic, atraumatic.  EYES: Pupils equal, round, reactive to light. Extraocular muscles intact. No scleral icterus.  MOUTH: Moist mucosal membrane. Dentition intact. No abscess noted.  EAR, NOSE, THROAT: Clear without exudates. No external lesions.  NECK: Supple. No thyromegaly. No nodules. No JVD.  PULMONARY: decreased breath sounds with mild rhonchi worse at bases bilaterally.  CARDIOVASCULAR: S1 and S2. Regular rate and rhythm. No murmurs, rubs, or gallops. No edema. Pedal pulses 2+ bilaterally.  GASTROINTESTINAL: Soft, nontender, nondistended. No masses. Positive bowel sounds. No hepatosplenomegaly.  MUSCULOSKELETAL: No swelling,  clubbing, or edema. Range of motion full in all extremities.  NEUROLOGIC: Cranial nerves II through XII are intact. No gross focal neurological deficits. Sensation intact. Reflexes intact.  SKIN: No ulceration, lesions, rashes, or cyanosis. Skin warm and dry. Turgor intact.  PSYCHIATRIC: Mood, affect within normal limits. The patient is awake, alert and oriented x 3. Insight, judgment intact.       IMAGING     ASSESSMENT/PLAN    Complete atelectasis of left lung   - patient s/p diuresis with partial improvement however still has minimal lung sounds on left.   - she does have CKD 3 and may not produce adequate fluid removal via medication alone   - we discussed options for thoracentesis and patient would like to proceed with this procedure.  -incentive spirometry at bedside to be used multiple times daily  - PT/OT - etiology is likely poor nutrition post ICU hospitalization with CHF and CKD all contributing to third spaced interstitial edema , peripheral edema and effusion     Bilateral pleural effusions    - s/p throacentesis Sept 2024   - consultation for IR with US guided throacentesis - therapeutic    -patient on heparin drip -prealbumin -urine protein -holding diuresis today  Sepsis due to right groin cellulitis   S/p vascular surgery eval S/p vanco /rocephin  - patient appears to be clinically improved.  - s/p ID revaluation - appreciate input  -E faecalis noted on    Moderate proteic calorie malnutrition   - nutritional consultation    - bitemporal wasting and peripheral muscle weakness noted   - contributing to edema/effusion   - complicating tissue healing  -high protein diet  Thank you for allowing me to participate in the care of this patient.  Patient/Family are satisfied with care plan and all questions have been answered.    Provider disclosure: Patient with  at least one acute or chronic illness or injury that poses a threat to life or bodily function  and is being managed actively during this encounter.  All of the below services have been performed independently by signing provider:  review of prior documentation from internal and or external health records.  Review of previous and current lab results.  Interview and comprehensive assessment during patient visit today. Review of current and previous chest radiographs/CT scans. Discussion of management and test interpretation with health care team and patient/family.   This document was prepared using Dragon voice recognition software and may include unintentional dictation errors.     Vida Rigger, M.D.  Division of Pulmonary & Critical Care Medicine

## 2023-05-02 NOTE — TOC Progression Note (Signed)
Transition of Care [facility]) - Progression Note    Patient Details  Name: Dawn Bruce MRN: [id number] Date of Birth: 04-Feb-1946  Transition of Care [facility] CM/SW Contact  Chapman Fitch, RN Phone Number: 05/02/2023, 1:06 PM  Clinical Narrative:        Home vac and supplies stored in label box in MD dictation area on 2c    Expected Discharge Plan and Services                                               Social Determinants of Health (SDOH) Interventions SDOH Screenings   Food Insecurity: No Food Insecurity (04/23/2023)  Housing: Low Risk  (04/23/2023)  Transportation Needs: No Transportation Needs (04/23/2023)  Utilities: Not At Risk (04/23/2023)  Alcohol Screen: Low Risk  (12/08/2022)  Depression (PHQ2-9): Low Risk  (12/08/2022)  Financial Resource Strain: Low Risk  (10/13/2022)  Physical Activity: Insufficiently Active (10/13/2022)  Social Connections: Unknown (10/13/2022)  Stress: No Stress Concern Present (10/13/2022)  Tobacco Use: Medium Risk (04/27/2023)    Readmission Risk Interventions     No data to display

## 2023-05-02 NOTE — Progress Notes (Signed)
PT Cancellation Note  Patient Details Name: Dawn Bruce MRN: [id number] DOB: 1946-04-28   Cancelled Treatment:    Reason Eval/Treat Not Completed: Medical issues which prohibited therapy (Re-evaluation attempted s/p R groin I&D (12/10).  Patient hypotensive with noted drop in Hgb; appears generally pale, weak.  Unable to tolerate OOB/progressive activity at this time. Will re-attempt next date as medically appropriate.)   [name] H. Manson Passey, PT, DPT, NCS 05/02/23, 3:35 PM [phone]

## 2023-05-03 ENCOUNTER — Inpatient Hospital Stay: Payer: Medicare Other

## 2023-05-03 DIAGNOSIS — S31109A Unspecified open wound of abdominal wall, unspecified quadrant without penetration into peritoneal cavity, initial encounter: Secondary | ICD-10-CM

## 2023-05-03 DIAGNOSIS — N179 Acute kidney failure, unspecified: Secondary | ICD-10-CM | POA: Diagnosis not present

## 2023-05-03 DIAGNOSIS — L03311 Cellulitis of abdominal wall: Secondary | ICD-10-CM

## 2023-05-03 DIAGNOSIS — T8130XA Disruption of wound, unspecified, initial encounter: Secondary | ICD-10-CM | POA: Diagnosis not present

## 2023-05-03 DIAGNOSIS — N1831 Chronic kidney disease, stage 3a: Secondary | ICD-10-CM | POA: Diagnosis not present

## 2023-05-03 DIAGNOSIS — A419 Sepsis, unspecified organism: Secondary | ICD-10-CM | POA: Diagnosis not present

## 2023-05-03 DIAGNOSIS — J90 Pleural effusion, not elsewhere classified: Secondary | ICD-10-CM | POA: Diagnosis not present

## 2023-05-03 DIAGNOSIS — L03314 Cellulitis of groin: Secondary | ICD-10-CM | POA: Diagnosis not present

## 2023-05-03 LAB — CBC
HCT: 30.3 % — ABNORMAL LOW (ref 36.0–46.0)
Hemoglobin: 9.8 g/dL — ABNORMAL LOW (ref 12.0–15.0)
MCH: 29.2 pg (ref 26.0–34.0)
MCHC: 32.3 g/dL (ref 30.0–36.0)
MCV: 90.2 fL (ref 80.0–100.0)
Platelets: 117 10*3/uL — ABNORMAL LOW (ref 150–400)
RBC: 3.36 MIL/uL — ABNORMAL LOW (ref 3.87–5.11)
RDW: 15.9 % — ABNORMAL HIGH (ref 11.5–15.5)
WBC: 7.8 10*3/uL (ref 4.0–10.5)
nRBC: 0 % (ref 0.0–0.2)

## 2023-05-03 LAB — TYPE AND SCREEN
ABO/RH(D): O POS
Antibody Screen: NEGATIVE
Unit division: 0

## 2023-05-03 LAB — BASIC METABOLIC PANEL
Anion gap: 10 (ref 5–15)
BUN: 79 mg/dL — ABNORMAL HIGH (ref 8–23)
CO2: 21 mmol/L — ABNORMAL LOW (ref 22–32)
Calcium: 8.2 mg/dL — ABNORMAL LOW (ref 8.9–10.3)
Chloride: 106 mmol/L (ref 98–111)
Creatinine, Ser: 3.95 mg/dL — ABNORMAL HIGH (ref 0.44–1.00)
GFR, Estimated: 11 mL/min — ABNORMAL LOW (ref >60)
Glucose, Bld: 105 mg/dL — ABNORMAL HIGH (ref 70–99)
Potassium: 4.6 mmol/L (ref 3.5–5.1)
Sodium: 137 mmol/L (ref 135–145)

## 2023-05-03 LAB — MAGNESIUM: Magnesium: 2.3 mg/dL (ref 1.7–2.4)

## 2023-05-03 LAB — BPAM RBC
Blood Product Expiration Date: 202501062359
ISSUE DATE / TIME: 202412111609
Unit Type and Rh: 5100

## 2023-05-03 MED ORDER — HYDROMORPHONE HCL 1 MG/ML IJ SOLN
1.0000 mg | INTRAMUSCULAR | Status: AC
Start: 2023-05-03 — End: 2023-05-03
  Administered 2023-05-03: 1 mg via INTRAVENOUS
  Filled 2023-05-03: qty 1

## 2023-05-03 MED ORDER — HYDROMORPHONE HCL 1 MG/ML IJ SOLN: 0.5000 mg | Freq: Once | INTRAMUSCULAR | Status: DC

## 2023-05-03 MED ORDER — LACTATED RINGERS IV SOLN: INTRAVENOUS | Status: DC

## 2023-05-03 NOTE — Progress Notes (Addendum)
Progress Note   Patient: Dawn Bruce ZOX:[id number] DOB: 10/02/1945 DOA: 04/22/2023     11 DOS: the patient was seen and examined on 05/03/2023   Brief hospital course: Nachelle Applebaum is a 77 y.o. female with medical history significant of dCHF, CAD (s/p of recent DES), A fib on Eliquis, HTN, HLD, CDK-3a, chronic pain syndrome, multiple skin cancer, perforated appendicitis, former smoker, who presents with right groin pain.  Patient was recently hospitalized from 11/21 - 11/29 due to non-STEMI. Patient is s/p of LHC with DES placement. Pt developed retroperitoneal hematoma & hemorrhagic shock due to complication of Iliac artery laceration during left heart cath of right groin. Pt underwent surgical repair of external iliac artery by [facility] on 11/23.  Presented back to ED 12/1 due to increased redness, swelling, and pain at incision site of the cath. Failed outpatient Abx x1 dose.  Hypotensive to 80/50 on presentation. 12/2: pulmonology, and IR consulted to address pleural effusion. ID consulted to address abx course. Transitioned to linezolid. Vascular evaluated and recommended debridement. 12/3: Thoracentesis removed 1.6L left side 12/4:stable ORA, afebrile. Wound clinically greatly improved. Debridement today. Plan to stop heparin gtt and return to eliquis + DAPT s/p procedure 12/6: additional debridement.  Debridement again 12/10. Now on zosyn. Doing well. Respiratory status stable ORA.  12/12.  Patient had a worsening renal function, nephrology consult obtained, started IV fluids.   Principal Problem:   Cellulitis of right groin Active Problems:   Sepsis (HCC)   CAD (coronary artery disease)   Peritoneal hematoma   Essential hypertension   Chronic diastolic heart failure (HCC)   Hypokalemia   Chronic kidney disease, stage 3a (HCC)   Pleural effusion   Abnormal LFTs   Atrial fibrillation, chronic (HCC)   Diarrhea   Chronic pain syndrome   HLD (hyperlipidemia)   HFrEF (heart failure  with reduced ejection fraction) (HCC)   Severe sepsis (HCC)   Infected wound   Cellulitis of right thigh   Acute kidney injury superimposed on stage 3a chronic kidney disease (HCC)   Metabolic acidosis   Overweight (BMI 25.0-29.9)   Assessment and Plan: Sepsis due to cellulitis of right groin Right groin wound infection secondary to surgical repair of pseudoaneurysm. Sepsis criteria met on admission with WBC 21.3, heart rate 104, RR 29.  Lactic acid normal 1.7.   Sepsis appears to be secondary to right groin surgical wound infection.  Patient had debridement x 2, wound-vac in place Wound culture grow E. coli, Morganella morganii, Proteus Morales, Enterococcus faecalis.  Blood culture negative.  Initially treated with linezolid + cefepime, switched to Zosyn on 12/11.  Patient has been followed by ID. Scheduled to have dressing changes by vascular surgery today.  Also asked TOC to set up outpatient wound VAC.   Hypervolemia  Bilateral Pleural effusions Acute on chronic systolic congestive heart failure.  Hx of CAD: s/p of recent DES. Patient initially received IV Lasix, also had a thoracentesis. Volume status has improved. Also restarted the Plavix.   Acute renal failure on chronic kidney disease stage IIIa. Hypokalemia. Mild metabolic acidosis. Transient hypotension. Patient has much worsening renal function with creatinine increased to 2.78 from 1.08 on 12/5.  This could be secondary to diuretics.  Patient also had an episode of hypotension yesterday, received a 500 mL normal saline bolus.  Blood pressure medicine was discontinued. Worsening renal function with creatinine went up to 3.9 today.  Reviewed prior renal ultrasound performed 11/24, no hydronephrosis.  Consult nephrology.  Continue IV fluids.  Peritoneal hematoma Anemia acute on chronic. Thrombocytopenia. Hemoglobin dropped down to 7.6 12/11, received another unit of PRBC transfusion.  Normal iron, B12 level borderline,  check homocystine level.  Hemoglobin went up to 9.8 today. Patient developed mild thrombocytopenia of 117, not currently on heparin.  This appears to be secondary to acute renal failure.  Continue to follow.   Essential hypertension: Hold off all blood pressure medicines including beta-blocker due to hypotension.   Atrial fibrillation, chronic (HCC): rate controlled -Switch back to eliquis Hold off Coreg.   Diarrhea: C. difficile negative. -As needed Imodium   Chronic pain syndrome -Continue home fentanyl patch and Lyrica   HLD: -lipitor and zetia       Subjective:  Patient denies any short of breath or cough. No rectal bleeding or black stools.  No nausea vomiting abdominal pain.  Physical Exam: Vitals:   05/02/23 2202 05/03/23 0259 05/03/23 0730 05/03/23 0751  BP: (!) 115/50 (!) 128/50 (!) 121/59   Pulse: 78 74 93   Resp: 16 16 18    Temp: 98.8 F (37.1 C) 97.9 F (36.6 C) 98 F (36.7 C)   TempSrc: Oral     SpO2: 97% 96% 96% 91%  Weight:      Height:       General exam: Appears calm and comfortable  Respiratory system: Clear to auscultation. Respiratory effort normal. Cardiovascular system: Irregular. No JVD, murmurs, rubs, gallops or clicks. No pedal edema. Gastrointestinal system: Abdomen is nondistended, soft and nontender. No organomegaly or masses felt. Normal bowel sounds heard. Central nervous system: Alert and oriented. No focal neurological deficits. Extremities: Symmetric 5 x 5 power. Skin: No rashes, lesions or ulcers Psychiatry:  Mood & affect appropriate.    Data Reviewed:  Review lab results, also reviewed prior CT scan and ultrasound results.  Family Communication: Husband updated at bedside.  Disposition: Status is: Inpatient Remains inpatient appropriate because: Severity of disease, IV treatment, inpatient procedure.     Time spent: 50 minutes  Author: Marrion Coy, MD 05/03/2023 11:26 AM  For on call review  [email or web address].

## 2023-05-03 NOTE — Progress Notes (Signed)
Date of Admission:  04/22/2023     ID: Dawn Bruce is a 77 y.o. female  Principal Problem:   Cellulitis of right groin Active Problems:   Chronic pain syndrome   Essential hypertension   HLD (hyperlipidemia)   Chronic diastolic heart failure (HCC)   Sepsis (HCC)   CAD (coronary artery disease)   Chronic kidney disease, stage 3a (HCC)   Peritoneal hematoma   Pleural effusion   Abnormal LFTs   Hypokalemia   Atrial fibrillation, chronic (HCC)   Diarrhea   HFrEF (heart failure with reduced ejection fraction) (HCC)   Severe sepsis (HCC)   Infected wound   Cellulitis of right thigh   Acute kidney injury superimposed on stage 3a chronic kidney disease (HCC)   Metabolic acidosis   Overweight (BMI 25.0-29.9)   Antibiotic 12/2-12/9- cefepime + linezolid 12/9= zosyn  Dawn Bruce is a 77 y.o. female with a history of CAD, HTN, LD was recently in [facility] between 11/21-11/29 for chest pain and found to have STEMI, underwent angio on 11/22 and ostial LADstent placement- complicated by retroperitoneal hematoma, hemorrhagic shock needing  repair of rt external iliac and circumflex artery by Dr.Esco on 11/222/4. She also received PRBC and was in ICU- She was discharge don 04/20/23 and called vascular who prescribed keflex on 11/30. She came back to the ED on 12/1 with infected wound rt groin. Culture sent and was started on Iv vanco and ceftriaxone and I am asked to see the patient.   Subjective: sleeping  Medications:   alum & mag hydroxide-simeth  15 mL Oral Once   apixaban  2.5 mg Oral BID   aspirin EC  81 mg Oral Daily   atorvastatin  80 mg Oral Daily   Chlorhexidine Gluconate Cloth  6 each Topical Daily   clopidogrel  75 mg Oral Daily   ezetimibe  10 mg Oral Daily   fentaNYL  1 patch Transdermal Q72H    HYDROmorphone (DILAUDID) injection  0.5 mg Intravenous Once   midodrine  2.5 mg Oral TID WC   pantoprazole  40 mg Oral Daily   pregabalin  75 mg Oral BID   sodium chloride flush   10-40 mL Intracatheter Q12H   traZODone  50 mg Oral QHS    Objective: Vital signs in last 24 hours: Patient Vitals for the past 24 hrs:  BP Temp Temp src Pulse Resp SpO2  05/03/23 0751 -- -- -- -- -- 91 %  05/03/23 0730 (!) 121/59 98 F (36.7 C) -- 93 18 96 %  05/03/23 0259 (!) 128/50 97.9 F (36.6 C) -- 74 16 96 %  05/02/23 2202 (!) 115/50 98.8 F (37.1 C) Oral 78 16 97 %  05/02/23 2054 -- -- -- -- -- 94 %  05/02/23 1827 (!) 97/58 97.9 F (36.6 C) Oral 86 17 94 %  05/02/23 1629 (!) 87/43 98.1 F (36.7 C) Oral 83 17 96 %  05/02/23 1605 (!) 88/37 97.8 F (36.6 C) Oral 83 17 95 %  05/02/23 1439 (!) 90/38 -- -- 75 -- --  05/02/23 1353 (!) 72/45 -- -- 89 -- --      PHYSICAL EXAM:  General: pt woke up on calling her name Rt groin wound     Extremities: bruising thighs Skin: as above Lymph: Cervical, supraclavicular normal. Neurologic: Grossly non-focal  Lab Results    Latest Ref Rng & Units 05/03/2023    6:59 AM 05/02/2023    5:50 AM 05/01/2023    4:56  AM  CBC  WBC 4.0 - 10.5 K/uL 7.8  8.0  8.9   Hemoglobin 12.0 - 15.0 g/dL 9.8  7.6  8.7   Hematocrit 36.0 - 46.0 % 30.3  23.5  26.7   Platelets 150 - 400 K/uL 117  166  226        Latest Ref Rng & Units 05/03/2023    6:59 AM 05/02/2023    5:50 AM 05/01/2023    4:56 AM  CMP  Glucose 70 - 99 mg/dL 413  244  010   BUN 8 - 23 mg/dL 79  55  43   Creatinine 0.44 - 1.00 mg/dL 2.72  5.36  6.44   Sodium 135 - 145 mmol/L 137  139  138   Potassium 3.5 - 5.1 mmol/L 4.6  4.5  3.9   Chloride 98 - 111 mmol/L 106  108  107   CO2 22 - 32 mmol/L 21  23  25    Calcium 8.9 - 10.3 mg/dL 8.2  8.4  8.7       Microbiology: Wound culture E.coli, proteus morganella Surgical culture enterococcus fecalis  Studies/Results:   DG Chest Port 1 View Result Date: 05/01/2023 CLINICAL DATA:  Pleural effusion EXAM: PORTABLE CHEST 1 VIEW COMPARISON:  04/29/2023 FINDINGS: Small bilateral pleural effusions, left greater than right.  Associated lower lobe opacities, likely atelectasis. No pneumothorax. The heart is normal in size. Right arm PICC terminating at the cavoatrial junction. IMPRESSION: Small bilateral pleural effusions, left greater than right. Associated lower lobe opacities, likely atelectasis. Electronically Signed   By: Charline Bills M.D.   On: 05/01/2023 17:53     Assessment/Plan: Infected groin wound at the site of surgery with dehiscence of wound and surrounding cellulitis Culture polymicrobial infection ( 3 gram neg rods and enteococcus debridement done .has wound vac On  zosyn day 11 of antibiotic  On discharge she has CIPRO+ augmentin option ( both are BID meds but the dose will have to adjusted according to crcl- check with pharmacist if discharged during weekend. Total of 4 weeks of antibiotic until 05/20/23  AKI on CKD worsening - seen by nephrologist- multifactorial etiology- IV contrast, diuretics   Left pleural effusion-    Hematoma both retroperitoneal and superficial at the site of cardiac cath    Recent hemorrhagic shock needing PRBc   Injury to external iliac artery on the right which was repaired   CAD s/p stent ( restenosis of ostial LAD stent)? Afib   Discussed the management with care team

## 2023-05-03 NOTE — Progress Notes (Signed)
Progress Note    05/03/2023 7:53 AM 2 Days Post-Op  Subjective:  Dawn Bruce is a 77 yo female who presents to [facility] emergency room with post operative right groin infection.  Patient is now postop day 6 from right groin incision and drainage with wound washout and wound VAC placement for the third time.  Patient is resting comfortably in bed this afternoon.  Wound VAC is in place and working well.  Noted serosanguineous drainage to canister and the about 45 mL.  Patient does endorse some pain and soreness to her right groin but states it is manageable.  Patient remains on her 2.5 mg of Eliquis twice daily for her atrial fibrillation and continues on ASA 81 mg daily and Plavix 75 mg daily post surgical repair of her right femoral artery.    Vitals:   05/03/23 0730 05/03/23 0751  BP: (!) 121/59   Pulse: 93   Resp: 18   Temp: 98 F (36.7 C)   SpO2: 96% 91%   Physical Exam: Cardiac:  Hx of Atrial Fibrillation. Regular Rate  Lungs: Bilateral lungs with diminished breath sounds bases. Mild Dyspnea with cough. No wheezing noted. Non Labored breathing this morning. No oxygen required.  Incisions:  Right groin with wound VAC in place and working well.  Maintain suction and noted 60 mL serosanguineous drainage now. Extremities:  Bilateral lower extremities warm to touch. Palpable pulses. Noted +1 edema to right lower extremity.  Abdomen:  Positive bowel sounds throughout, soft and non tender and non distended.  Neurologic: AAOX4, Follows commands.   CBC    Component Value Date/Time   WBC 7.8 05/03/2023 0659   RBC 3.36 (L) 05/03/2023 0659   HGB 9.8 (L) 05/03/2023 0659   HGB 13.6 06/01/2022 0000   HCT 30.3 (L) 05/03/2023 0659   HCT 40.5 06/01/2022 0000   PLT 117 (L) 05/03/2023 0659   PLT 132 (L) 06/01/2022 0000   MCV 90.2 05/03/2023 0659   MCV 90 06/01/2022 0000   MCH 29.2 05/03/2023 0659   MCHC 32.3 05/03/2023 0659   RDW 15.9 (H) 05/03/2023 0659   RDW 12.8 06/01/2022 0000    LYMPHSABS 0.7 04/22/2023 1528   LYMPHSABS 0.7 08/30/2021 0917   MONOABS 1.2 (H) 04/22/2023 1528   EOSABS 0.2 04/22/2023 1528   EOSABS 0.0 08/30/2021 0917   BASOSABS 0.0 04/22/2023 1528   BASOSABS 0.0 08/30/2021 0917    BMET    Component Value Date/Time   NA 139 05/02/2023 0550   NA 147 (H) 06/01/2022 0000   K 4.5 05/02/2023 0550   CL 108 05/02/2023 0550   CO2 23 05/02/2023 0550   GLUCOSE 120 (H) 05/02/2023 0550   BUN 55 (H) 05/02/2023 0550   BUN 23 06/01/2022 0000   CREATININE 2.78 (H) 05/02/2023 0550   CALCIUM 8.4 (L) 05/02/2023 0550   GFRNONAA 17 (L) 05/02/2023 0550   GFRAA 54 (L) 10/08/2019 1419    INR    Component Value Date/Time   INR 1.5 (H) 04/22/2023 1540     Intake/Output Summary (Last 24 hours) at 05/03/2023 0753 Last data filed at 05/03/2023 0981 Gross per 24 hour  Intake 620 ml  Output --  Net 620 ml     Assessment/Plan:  77 y.o. female is s/p  right groin postop infection with incision and drainage of wound with wound VAC placement x 3.  2 Days Post-Op   PLAN: Dr. Vilinda Bruce plans on changing the wound VAC at the patient's bedside today. Continue anticoagulation  as ordered. Continue work with PT/OT for preparation of possible discharge at the end of the week.   DVT prophylaxis: Eliquis 2.5 mg twice daily, aspirin 81 mg daily, Plavix 75 mg daily.   Dawn Bruce [facility] 05/03/2023 7:53 AM

## 2023-05-03 NOTE — Progress Notes (Signed)
[facility]  ROUNDING NOTE   Subjective:   Dawn Bruce  is a 77 y.o. female with medical problems of hypertension, heart failure with preserved ejection fraction, history of pleural effusions, history of diffuse vascular disease including coronary artery disease-history of PCI in LAD stent October 2023, chronic kidney disease, prediabetes, atrial fibrillation status post cardioversion in 2022 followed by Dr. Alyson Locket at [facility] nephrology. Patient presents to the ED with hypotension and suspected wound infection. She has been admitted for Cellulitis of right groin [L03.314] Cellulitis of right thigh [L03.115]  Patient is known to our practice from previous admission. She states she has had a poor appetite and is currently sitting up in bed with sub sandwich untouched in her lap. Family at bedside. They states she's never been a big eater. Reports dizziness prior to ED arrival with weakness.   Creatinine on arrival 1.17 and has now increased to 3.95.  It appears patient was exposed to IV contrast, diuretic, and reason for presentation is hypotension.  We have been consulted to evaluate acute kidney injury.   Objective:  Vital signs in last 24 hours:  Temp:  [97.9 F (36.6 C)-98.8 F (37.1 C)] 98 F (36.7 C) (12/12 1609) Pulse Rate:  [74-93] 91 (12/12 1609) Resp:  [16-18] 18 (12/12 1609) BP: (97-161)/(50-64) 161/64 (12/12 1609) SpO2:  [91 %-99 %] 99 % (12/12 1609)  Weight change:  Filed Weights   04/29/23 0500 04/30/23 0214 05/01/23 0500  Weight: 72.5 kg 71.4 kg 68.6 kg    Intake/Output: I/O last 3 completed shifts: In: 744.6 [Blood:420; IV Piggyback:324.6] Out: 400 [Urine:400]   Intake/Output this shift:  No intake/output data recorded.  Physical Exam: General: NAD  Head: Normocephalic, atraumatic. Moist oral mucosal membranes  Eyes: Anicteric  Neck: Supple  Lungs:  Clear to auscultation.normal effort  Heart: Regular rate and rhythm  Abdomen:  Soft, nontender,    Extremities:  No peripheral edema.  Neurologic: Nonfocal, moving all four extremities  Skin: No lesions       Basic Metabolic Panel: Recent Labs  Lab 04/30/23 1630 05/01/23 0456 05/02/23 0550 05/03/23 0659  NA 137 138 139 137  K 4.1 3.9 4.5 4.6  CL 110 107 108 106  CO2 18* 25 23 21*  GLUCOSE 110* 116* 120* 105*  BUN 38* 43* 55* 79*  CREATININE 1.66* 1.72* 2.78* 3.95*  CALCIUM 8.4* 8.7* 8.4* 8.2*  MG  --   --   --  2.3    Liver Function Tests: Recent Labs  Lab 04/30/23 1630  AST 24  ALT 25  ALKPHOS 59  BILITOT 0.9  PROT 4.8*  ALBUMIN 2.1*   No results for input(s): "LIPASE", "AMYLASE" in the last 168 hours. No results for input(s): "AMMONIA" in the last 168 hours.  CBC: Recent Labs  Lab 04/27/23 0500 04/28/23 0610 05/01/23 0456 05/02/23 0550 05/03/23 0659  WBC 10.6* 11.0* 8.9 8.0 7.8  HGB 9.4* 9.2* 8.7* 7.6* 9.8*  HCT 28.5* 29.1* 26.7* 23.5* 30.3*  MCV 85.3 88.7 86.4 88.3 90.2  PLT 279 264 226 166 117*    Cardiac Enzymes: No results for input(s): "CKTOTAL", "CKMB", "CKMBINDEX", "TROPONINI" in the last 168 hours.  BNP: Invalid input(s): "POCBNP"  CBG: Recent Labs  Lab 04/28/23 2109 04/29/23 0804 04/29/23 1648  GLUCAP 98 108* 101*    Microbiology: Results for orders placed or performed during the hospital encounter of 04/22/23  Blood Culture (routine x 2)     Status: None   Collection Time: 04/22/23  3:28 PM   Specimen: BLOOD  Result Value Ref Range Status   Specimen Description BLOOD RIGHT ANTECUBITAL  Final   Special Requests   Final    BOTTLES DRAWN AEROBIC AND ANAEROBIC Blood Culture results may not be optimal due to an excessive volume of blood received in culture bottles   Culture   Final    NO GROWTH 5 DAYS Performed at [facility], [address] [place], [place] [address]    Report Status 04/27/2023 FINAL  Final  C Difficile Quick Screen w PCR reflex     Status: None   Collection Time: 04/22/23  4:19 PM    Specimen: STOOL  Result Value Ref Range Status   C Diff antigen NEGATIVE NEGATIVE Final   C Diff toxin NEGATIVE NEGATIVE Final   C Diff interpretation No C. difficile detected.  Final    Comment: Performed at [facility], [address] [place], [place] [address]  Gastrointestinal Panel by PCR , Stool     Status: None   Collection Time: 04/22/23  4:25 PM   Specimen: Stool  Result Value Ref Range Status   Campylobacter species NOT DETECTED NOT DETECTED Final   Plesimonas shigelloides NOT DETECTED NOT DETECTED Final   Salmonella species NOT DETECTED NOT DETECTED Final   Yersinia enterocolitica NOT DETECTED NOT DETECTED Final   Vibrio species NOT DETECTED NOT DETECTED Final   Vibrio cholerae NOT DETECTED NOT DETECTED Final   Enteroaggregative E coli (EAEC) NOT DETECTED NOT DETECTED Final   Enteropathogenic E coli (EPEC) NOT DETECTED NOT DETECTED Final   Enterotoxigenic E coli (ETEC) NOT DETECTED NOT DETECTED Final   Shiga like toxin producing E coli (STEC) NOT DETECTED NOT DETECTED Final   Shigella/Enteroinvasive E coli (EIEC) NOT DETECTED NOT DETECTED Final   Cryptosporidium NOT DETECTED NOT DETECTED Final   Cyclospora cayetanensis NOT DETECTED NOT DETECTED Final   Entamoeba histolytica NOT DETECTED NOT DETECTED Final   Giardia lamblia NOT DETECTED NOT DETECTED Final   Adenovirus F40/41 NOT DETECTED NOT DETECTED Final   Astrovirus NOT DETECTED NOT DETECTED Final   Norovirus GI/GII NOT DETECTED NOT DETECTED Final   Rotavirus A NOT DETECTED NOT DETECTED Final   Sapovirus (I, II, IV, and V) NOT DETECTED NOT DETECTED Final    Comment: Performed at [facility], [address] [place], [place] [address]  Blood Culture (routine x 2)     Status: None   Collection Time: 04/23/23  6:31 AM   Specimen: BLOOD  Result Value Ref Range Status   Specimen Description BLOOD BLOOD RIGHT HAND  Final   Special Requests   Final    BOTTLES DRAWN AEROBIC AND ANAEROBIC Blood Culture  results may not be optimal due to an inadequate volume of blood received in culture bottles   Culture   Final    NO GROWTH 5 DAYS Performed at [facility], [address] [place], [place] [address]    Report Status 04/28/2023 FINAL  Final  Aerobic Culture w Gram Stain (superficial specimen)     Status: None   Collection Time: 04/23/23 10:42 AM   Specimen: Groin  Result Value Ref Range Status   Specimen Description   Final    GROIN Performed at [facility], [address], [place], [place] [address]    Special Requests   Final    NONE Performed at [facility], [address], [place], [place] [address]    Gram Stain   Final  RARE WBC PRESENT, PREDOMINANTLY PMN RARE GRAM NEGATIVE RODS RARE GRAM NEGATIVE COCCOBACILLI Performed at [facility], [address] [place], [place] [address]    Culture   Final    MODERATE MORGANELLA MORGANII FEW ESCHERICHIA COLI FEW PROTEUS MIRABILIS    Report Status 04/26/2023 FINAL  Final   Organism ID, Bacteria ESCHERICHIA COLI  Final   Organism ID, Bacteria MORGANELLA MORGANII  Final   Organism ID, Bacteria PROTEUS MIRABILIS  Final      Susceptibility   Escherichia coli - MIC*    AMPICILLIN >=32 RESISTANT Resistant     CEFEPIME <=0.12 SENSITIVE Sensitive     CEFTAZIDIME <=1 SENSITIVE Sensitive     CEFTRIAXONE <=0.25 SENSITIVE Sensitive     CIPROFLOXACIN <=0.25 SENSITIVE Sensitive     GENTAMICIN <=1 SENSITIVE Sensitive     IMIPENEM <=0.25 SENSITIVE Sensitive     TRIMETH/SULFA <=20 SENSITIVE Sensitive     AMPICILLIN/SULBACTAM 16 INTERMEDIATE Intermediate     PIP/TAZO <=4 SENSITIVE Sensitive ug/mL    * FEW ESCHERICHIA COLI   Morganella morganii - MIC*    AMPICILLIN >=32 RESISTANT Resistant     CEFTAZIDIME <=1 SENSITIVE Sensitive     CIPROFLOXACIN <=0.25 SENSITIVE Sensitive     GENTAMICIN <=1 SENSITIVE Sensitive     IMIPENEM 4 SENSITIVE Sensitive     TRIMETH/SULFA <=20 SENSITIVE Sensitive      AMPICILLIN/SULBACTAM 16 INTERMEDIATE Intermediate     PIP/TAZO <=4 SENSITIVE Sensitive ug/mL    * MODERATE MORGANELLA MORGANII   Proteus mirabilis - MIC*    AMPICILLIN <=2 SENSITIVE Sensitive     CEFEPIME <=0.12 SENSITIVE Sensitive     CEFTAZIDIME <=1 SENSITIVE Sensitive     CEFTRIAXONE <=0.25 SENSITIVE Sensitive     CIPROFLOXACIN <=0.25 SENSITIVE Sensitive     GENTAMICIN <=1 SENSITIVE Sensitive     IMIPENEM 2 SENSITIVE Sensitive     TRIMETH/SULFA <=20 SENSITIVE Sensitive     AMPICILLIN/SULBACTAM <=2 SENSITIVE Sensitive     PIP/TAZO <=4 SENSITIVE Sensitive ug/mL    * FEW PROTEUS MIRABILIS  Aerobic/Anaerobic Culture w Gram Stain (surgical/deep wound)     Status: None   Collection Time: 04/25/23  2:41 PM   Specimen: Wound; Body Fluid  Result Value Ref Range Status   Specimen Description   Final    WOUND Performed at [facility], [address] [place], [place] [address]    Special Requests RETROPERITONEAL HEMATOMA SPEC A  Final   Gram Stain   Final    FEW WBC PRESENT,BOTH PMN AND MONONUCLEAR NO ORGANISMS SEEN    Culture   Final    RARE ENTEROCOCCUS FAECALIS NO ANAEROBES ISOLATED Performed at [facility], [address] [place], [place] [address]    Report Status 05/02/2023 FINAL  Final   Organism ID, Bacteria ENTEROCOCCUS FAECALIS  Final      Susceptibility   Enterococcus faecalis - MIC*    AMPICILLIN <=2 SENSITIVE Sensitive     VANCOMYCIN 2 SENSITIVE Sensitive     GENTAMICIN SYNERGY SENSITIVE Sensitive     * RARE ENTEROCOCCUS FAECALIS    Coagulation Studies: No results for input(s): "LABPROT", "INR" in the last 72 hours.  Urinalysis: No results for input(s): "COLORURINE", "LABSPEC", "PHURINE", "GLUCOSEU", "HGBUR", "BILIRUBINUR", "KETONESUR", "PROTEINUR", "UROBILINOGEN", "NITRITE", "LEUKOCYTESUR" in the last 72 hours.  Invalid input(s): "APPERANCEUR"    Imaging: No results found.   Medications:    piperacillin-tazobactam (ZOSYN)  IV 2.25 g  (05/03/23 1312)    alum & mag hydroxide-simeth  15 mL Oral Once   apixaban  2.5 mg Oral BID   aspirin EC  81 mg Oral Daily   atorvastatin  80 mg Oral Daily   Chlorhexidine Gluconate Cloth  6 each Topical Daily   clopidogrel  75 mg Oral Daily   ezetimibe  10 mg Oral Daily   fentaNYL  1 patch Transdermal Q72H    HYDROmorphone (DILAUDID) injection  0.5 mg Intravenous Once   pregabalin  75 mg Oral BID   sodium chloride flush  10-40 mL Intracatheter Q12H   traZODone  50 mg Oral QHS   acetaminophen, albuterol, loperamide, oxyCODONE, phenylephrine, sodium chloride flush  Assessment/ Plan:  Ms. Dawn Bruce is a 77 y.o.  female with medical problems of hypertension, heart failure with preserved ejection fraction, history of pleural effusions, history of diffuse vascular disease including coronary artery disease-history of PCI in LAD stent October 2023, chronic kidney disease, prediabetes, atrial fibrillation status post cardioversion in 2022 followed by Dr. Alyson Locket at [facility] nephrology. Patient presents to the ED with hypotension and suspected wound infection. She has been admitted for Cellulitis of right groin [L03.314] Cellulitis of right thigh [L03.115]   Acute Kidney Injury on chronic kidney disease stage IIIb with baseline creatinine 1.3 and GFR of 42 on 03/22/23.  Acute kidney injury appears multifactorial, hypotension, IV contrast exposure, and diuretics. Creatinine on admission, 1.17.  Patient experienced hypotension prior to ED arrival.  IV contrast exposure on 04/22/2023.  Received approximately 2 days of diuresis.  Creatinine now increased to 3.95.  No acute indication for dialysis.  May consider gentle IV hydration and avoid hypotension.  Continue to hold diuretics for now.  Lab Results  Component Value Date   CREATININE 3.95 (H) 05/03/2023   CREATININE 2.78 (H) 05/02/2023   CREATININE 1.72 (H) 05/01/2023    Intake/Output Summary (Last 24 hours) at 05/03/2023 1645 Last data filed  at 05/03/2023 0658 Gross per 24 hour  Intake 620 ml  Output --  Net 620 ml   2. Anemia of chronic kidney disease Lab Results  Component Value Date   HGB 9.8 (L) 05/03/2023    Hemoglobin within acceptable range.  Will monitor for now.  3.  Hypotension.  With review of outpatient records, blood pressure usually 140/82.  Significantly decreased during this admission.  Patient prescribed midodrine.  2.5 mg for 1 day.   LOS: 11 Furqan Gosselin 12/12/20244:45 PM

## 2023-05-03 NOTE — Plan of Care (Signed)
  Problem: Education: Goal: Knowledge of General Education information will improve Description Including pain rating scale, medication(s)/side effects and non-pharmacologic comfort measures Outcome: Progressing   

## 2023-05-03 NOTE — Progress Notes (Signed)
PULMONOLOGY         Date: 05/03/2023,   MRN# [id number] Dawn Bruce [birth year]     AdmissionWeight: 54.4 kg                 CurrentWeight: 68.6 kg  Referring provider: Dr Gilda Crease   CHIEF COMPLAINT:   Complete atelectasis of left lung with bilateral pleural effusions.    HISTORY OF PRESENT ILLNESS   77 year old female with a history of chronic GERD, basal cell carcinoma squamous cell carcinoma history of NSTEMI diastolic CHF, CAD status post recent PCI with DES, chronic A-fib on Eliquis, essential hypertension, dyslipidemia CKD 3A chronic pain syndrome remote perforated appendicitis, hospitalized November 2024 with non-STEMI.  Post left heart cath had developed retroperitoneal hematoma and hemorrhagic shock with complications of iliac artery laceration.  Status post surgical repair via vascular surgery service.  Had outpatient therapy with antibiotics with failure came in with transient hypotension and circulatory shock.  Found to have elevated cardiac biomarkers and acute on chronic CHF.  Vascular surgery was reconsulted today and PCCM consultation was placed to incidental finding of complete atelectasis of the left lung with surrounding bilateral pleural effusions worse on the left.  04/25/23- patient evaluated in PACU post op , appears to be mildy sedated still. Mildy hypotensive at this moment.  Lung sounds much improved post 1.6L left pleural space aspiration.  04/26/23- patient seen and examined , [name] (daughter) at bedside.  Patient receiving blood transfusion.  Lung sounds are clear , vitals are stable.  Hypoalbulminemia continues. Will order urine protein and pre albumin levels,  because patient shares she's eating well.  CXR in am to check interval effusion.  04/27/23- patient seen at bedside, husband and daughter present.  For surgery wound washout and vac exchange today.   Vitals stable , room air spO2 99%     04/30/23- patient seen at bedside.  Has  additional surgery planned with debridement.  CXR with recurring effusion and associated compressive atelelctasis.   Ideally would like to increase diuresis and recruitment maunevers.  E. Faecallis noted on wound culture.  I discussed case with Dr Avon Gully infectious disease specialist today.  Dr Dareen Piano is also considering pleurX catheter which patient may need if this does not resolve with medical management. She already had multiple thoracentesis. No noew labs are available for review but are in process. Will order purewick for urination due to physical weakness.      05/01/23-  patient reports improvement post diuresis.  Will obtain interval CXR today.  We discussed PleurX permanent tunneled catheter and she declined to have this done at the moment.  She is on room air with normoxia.  She developed mild contraction and we will take diuretic holiday.   05/02/23- patient with severe hypotension.  I have dcd her beta blocker today and imdur. She is now getting IV fluids and is able to speak.  Will start midodrine 5mg  po tid.  05/03/23- patient is now no longer hypotensive.  She had AKI and this is very likely related to circulatory shock over past 24h.  She had fluid via IV and I'm concerned regarding reacculumation of pleural fluid so I have dcd this now since she is hypertensive.  There is nephrology consult in progress.    PAST MEDICAL HISTORY   Past Medical History:  Diagnosis Date   Actinic keratosis    Chronic pain    GERD (gastroesophageal reflux disease) ?   Taking Pepcid   History  of basal cell carcinoma (BCC) 05/02/2021   right upper back paraspinal   History of SCC (squamous cell carcinoma) of skin 09/10/2019   left dorsum wrist ED&C done 10/28/19   History of SCC (squamous cell carcinoma) of skin 03/04/2020   right dorsum hand   History of squamous cell carcinoma in situ (SCCIS) 03/04/2020   left dorsum wrist  ED&C   History of squamous cell carcinoma in situ (SCCIS)  03/04/2020   right medial infraorbital  ED&C 04/28/2020   History of squamous cell carcinoma in situ (SCCIS) 10/28/2019   right dorsum hand proximal lateral   History of squamous cell carcinoma in situ (SCCIS) 10/28/2019   right dorsum hand proximal medial   Hyperlipidemia    Hypertension    NSTEMI (non-ST elevated myocardial infarction) (HCC) 04/12/2023   Renal disorder    Squamous cell carcinoma in situ 10/31/2021   left distal  tricep, EDC   Squamous cell carcinoma of skin 08/28/2022   Right volar forearm - [facility]     SURGICAL HISTORY   Past Surgical History:  Procedure Laterality Date   ABDOMINAL HYSTERECTOMY     APPENDECTOMY  April 2023   Burst appendix   APPLICATION OF WOUND VAC Right 04/25/2023   Procedure: APPLICATION OF WOUND VAC;  Surgeon: Renford Dills, MD;  Location: [facility] OR[facility];  Service: Vascular;  Laterality: Right;   APPLICATION OF WOUND VAC Right 04/27/2023   Procedure: WOUND VAC EXCHANGE;  Surgeon: Renford Dills, MD;  Location: [facility];  Service: Vascular;  Laterality: Right;   APPLICATION OF WOUND VAC Right 05/01/2023   Procedure: APPLICATION OF WOUND VAC;  Surgeon: Renford Dills, MD;  Location: [facility];  Service: Vascular;  Laterality: Right;   AUGMENTATION MAMMAPLASTY Bilateral    25-30 years ago   CARDIOVERSION N/A 12/01/2020   Procedure: CARDIOVERSION;  Surgeon: Debbe Odea, MD;  Location: [facility];  Service: Cardiovascular;  Laterality: N/A;   CAROTID ARTERY ANGIOPLASTY Right 1995(approximate)   CAROTID ENDARTERECTOMY  2001   CORONARY STENT INTERVENTION N/A 04/13/2023   Procedure: CORONARY STENT INTERVENTION;  Surgeon: Marcina Millard, MD;  Location: [facility];  Service: Cardiovascular;  Laterality: N/A;   COSMETIC SURGERY     ELBOW SURGERY Left    ENDARTERECTOMY FEMORAL Right 04/13/2023   Procedure: Right groin exploration with repair of right external iliac artery and right circumflex artery;  Surgeon: Bertram Denver,  MD;  Location: [facility];  Service: Vascular;  Laterality: Right;   FRACTURE SURGERY  2016   Arm   INCISION AND DRAINAGE OF WOUND Right 04/25/2023   Procedure: IRRIGATION AND DEBRIDEMENT WOUND;  Surgeon: Renford Dills, MD;  Location: [facility];  Service: Vascular;  Laterality: Right;   INCISION AND DRAINAGE OF WOUND Right 04/27/2023   Procedure: IRRIGATION AND DEBRIDEMENT WOUND;  Surgeon: Renford Dills, MD;  Location: [facility];  Service: Vascular;  Laterality: Right;   INCISION AND DRAINAGE OF WOUND Right 05/01/2023   Procedure: IRRIGATION AND DEBRIDEMENT WOUND;  Surgeon: Renford Dills, MD;  Location: [facility];  Service: Vascular;  Laterality: Right;   LEFT HEART CATH AND CORONARY ANGIOGRAPHY N/A 04/13/2023   Procedure: LEFT HEART CATH AND CORONARY ANGIOGRAPHY;  Surgeon: Marcina Millard, MD;  Location: [facility];  Service: Cardiovascular;  Laterality: N/A;   THORACENTESIS N/A 12/27/2020   Procedure: Alanson Puls;  Surgeon: Josephine Igo, DO;  Location: [facility];  Service: Pulmonary;  Laterality: N/A;   TONSILLECTOMY     TUBAL  LIGATION  ?     FAMILY HISTORY   Family History  Problem Relation Age of Onset   Cancer Mother    Cancer Father    Cancer Sister    Breast cancer Neg Hx      SOCIAL HISTORY   Social History   Tobacco Use   Smoking status: Former    Current packs/day: 0.00    Average packs/day: 0.3 packs/day for 15.0 years (3.8 ttl pk-yrs)    Types: Cigarettes    Start date: 69    Quit date: 2    Years since quitting: 34.9    Passive exposure: Past   Smokeless tobacco: Never   Tobacco comments:    quit around 1990-1995 (?)  Vaping Use   Vaping status: Never Used  Substance Use Topics   Alcohol use: Yes    Alcohol/week: 14.0 standard drinks of alcohol    Types: 14 Standard drinks or equivalent per week    Comment: 2 scotch shots   Drug use: Never     MEDICATIONS    Home Medication:     Current  Medication:  Current Facility-Administered Medications:    acetaminophen (TYLENOL) tablet 650 mg, 650 mg, Oral, Q6H PRN, Schnier, Latina Craver, MD, 650 mg at 05/01/23 1723   albuterol (PROVENTIL) (2.5 MG/3ML) 0.083% nebulizer solution 2.5 mg, 2.5 mg, Nebulization, Q4H PRN, Schnier, Latina Craver, MD, 2.5 mg at 05/03/23 0748   alum & mag hydroxide-simeth (MAALOX/MYLANTA) 200-200-20 MG/5ML suspension 15 mL, 15 mL, Oral, Once, Schnier, Latina Craver, MD   apixaban Everlene Balls) tablet 2.5 mg, 2.5 mg, Oral, BID, Schnier, Latina Craver, MD, 2.5 mg at 05/03/23 1610   aspirin EC tablet 81 mg, 81 mg, Oral, Daily, Schnier, Latina Craver, MD, 81 mg at 05/03/23 0934   atorvastatin (LIPITOR) tablet 80 mg, 80 mg, Oral, Daily, Schnier, Latina Craver, MD, 80 mg at 05/03/23 9604   Chlorhexidine Gluconate Cloth 2 % PADS 6 each, 6 each, Topical, Daily, Schnier, Latina Craver, MD, 6 each at 05/03/23 1133   clopidogrel (PLAVIX) tablet 75 mg, 75 mg, Oral, Daily, Schnier, Latina Craver, MD, 75 mg at 05/03/23 0934   ezetimibe (ZETIA) tablet 10 mg, 10 mg, Oral, Daily, Schnier, Latina Craver, MD, 10 mg at 05/03/23 0933   fentaNYL (DURAGESIC) 75 MCG/HR 1 patch, 1 patch, Transdermal, Q72H, Schnier, Latina Craver, MD, 1 patch at 05/02/23 0908   HYDROmorphone (DILAUDID) injection 0.5 mg, 0.5 mg, Intravenous, Once, Marrion Coy, MD   loperamide (IMODIUM) capsule 2 mg, 2 mg, Oral, BID PRN, Schnier, Latina Craver, MD   oxyCODONE (Oxy IR/ROXICODONE) immediate release tablet 5 mg, 5 mg, Oral, Q6H PRN, Schnier, Latina Craver, MD, 5 mg at 05/02/23 0902   pantoprazole (PROTONIX) EC tablet 40 mg, 40 mg, Oral, Daily, Schnier, Latina Craver, MD, 40 mg at 05/03/23 0934   phenylephrine ((USE for PREPARATION-H)) 0.25 % suppository 1 suppository, 1 suppository, Rectal, PRN, Schnier, Latina Craver, MD   piperacillin-tazobactam (ZOSYN) IVPB 2.25 g, 2.25 g, Intravenous, Q6H, Zeigler, Burtis Junes, RPH, Last Rate: 100 mL/hr at 05/03/23 1312, 2.25 g at 05/03/23 1312   pregabalin (LYRICA) capsule 75 mg, 75  mg, Oral, BID, Marrion Coy, MD, 75 mg at 05/03/23 0934   sodium chloride flush (NS) 0.9 % injection 10-40 mL, 10-40 mL, Intracatheter, Q12H, Schnier, Latina Craver, MD, 10 mL at 05/03/23 0820   sodium chloride flush (NS) 0.9 % injection 10-40 mL, 10-40 mL, Intracatheter, PRN, Schnier, Latina Craver, MD   traZODone (DESYREL) tablet 50 mg, 50 mg,  Oral, QHS, Schnier, Latina Craver, MD, 50 mg at 05/02/23 2213    ALLERGIES   Patient has no known allergies.     REVIEW OF SYSTEMS    Review of Systems:  Gen:  Denies  fever, sweats, chills weigh loss  HEENT: Denies blurred vision, double vision, ear pain, eye pain, hearing loss, nose bleeds, sore throat Cardiac:  No dizziness, chest pain or heaviness, chest tightness,edema Resp:   reports dyspnea chronically  Gi: Denies swallowing difficulty, stomach pain, nausea or vomiting, diarrhea, constipation, bowel incontinence Gu:  Denies bladder incontinence, burning urine Ext:   Denies Joint pain, stiffness or swelling Skin: Denies  skin rash, easy bruising or bleeding or hives Endoc:  Denies polyuria, polydipsia , polyphagia or weight change Psych:   Denies depression, insomnia or hallucinations   Other:  All other systems negative   VS: BP (!) 161/64 (BP Location: Right Arm)   Pulse 91   Temp 98 F (36.7 C)   Resp 18   Ht 5' 2.99" (1.6 m)   Wt 68.6 kg   SpO2 99%   BMI 26.80 kg/m      PHYSICAL EXAM    GENERAL:NAD, no fevers, chills, no weakness no fatigue HEAD: Normocephalic, atraumatic.  EYES: Pupils equal, round, reactive to light. Extraocular muscles intact. No scleral icterus.  MOUTH: Moist mucosal membrane. Dentition intact. No abscess noted.  EAR, NOSE, THROAT: Clear without exudates. No external lesions.  NECK: Supple. No thyromegaly. No nodules. No JVD.  PULMONARY: decreased breath sounds with mild rhonchi worse at bases bilaterally.  CARDIOVASCULAR: S1 and S2. Regular rate and rhythm. No murmurs, rubs, or gallops. No edema.  Pedal pulses 2+ bilaterally.  GASTROINTESTINAL: Soft, nontender, nondistended. No masses. Positive bowel sounds. No hepatosplenomegaly.  MUSCULOSKELETAL: No swelling, clubbing, or edema. Range of motion full in all extremities.  NEUROLOGIC: Cranial nerves II through XII are intact. No gross focal neurological deficits. Sensation intact. Reflexes intact.  SKIN: No ulceration, lesions, rashes, or cyanosis. Skin warm and dry. Turgor intact.  PSYCHIATRIC: Mood, affect within normal limits. The patient is awake, alert and oriented x 3. Insight, judgment intact.       IMAGING     ASSESSMENT/PLAN    Complete atelectasis of left lung   - patient s/p diuresis with partial improvement however still has minimal lung sounds on left.   - she does have CKD 3 and may not produce adequate fluid removal via medication alone   - we discussed options for thoracentesis and patient would like to proceed with this procedure.  -incentive spirometry at bedside to be used multiple times daily  - PT/OT - etiology is likely poor nutrition post ICU hospitalization with CHF and CKD all contributing to third spaced interstitial edema , peripheral edema and effusion     Bilateral pleural effusions    - s/p throacentesis Sept 2024   - consultation for IR with US guided throacentesis - therapeutic    -patient on heparin drip -prealbumin -urine protein -holding diuresis today due to aki and will continue to monitor renal function.  I anticipate this should improve with normal BP now.    Sepsis due to right groin cellulitis   S/p vascular surgery eval S/p vanco /rocephin  - patient appears to be clinically improved.  - s/p ID revaluation - appreciate input  -E faecalis noted on wound care   Moderate proteic calorie malnutrition   - nutritional consultation    - bitemporal wasting and peripheral muscle weakness noted   -  contributing to edema/effusion   - complicating tissue healing  -high protein  diet  Thank you for allowing me to participate in the care of this patient.  Patient/Family are satisfied with care plan and all questions have been answered.    Provider disclosure: Patient with at least one acute or chronic illness or injury that poses a threat to life or bodily function and is being managed actively during this encounter.  All of the below services have been performed independently by signing provider:  review of prior documentation from internal and or external health records.  Review of previous and current lab results.  Interview and comprehensive assessment during patient visit today. Review of current and previous chest radiographs/CT scans. Discussion of management and test interpretation with health care team and patient/family.   This document was prepared using Dragon voice recognition software and may include unintentional dictation errors.     Vida Rigger, M.D.  Division of Pulmonary & Critical Care Medicine

## 2023-05-04 DIAGNOSIS — N1831 Chronic kidney disease, stage 3a: Secondary | ICD-10-CM | POA: Diagnosis not present

## 2023-05-04 DIAGNOSIS — A419 Sepsis, unspecified organism: Secondary | ICD-10-CM | POA: Diagnosis not present

## 2023-05-04 DIAGNOSIS — J90 Pleural effusion, not elsewhere classified: Secondary | ICD-10-CM | POA: Diagnosis not present

## 2023-05-04 DIAGNOSIS — N179 Acute kidney failure, unspecified: Secondary | ICD-10-CM | POA: Diagnosis not present

## 2023-05-04 DIAGNOSIS — T8130XA Disruption of wound, unspecified, initial encounter: Secondary | ICD-10-CM | POA: Diagnosis not present

## 2023-05-04 DIAGNOSIS — L03314 Cellulitis of groin: Secondary | ICD-10-CM | POA: Diagnosis not present

## 2023-05-04 DIAGNOSIS — L03311 Cellulitis of abdominal wall: Secondary | ICD-10-CM | POA: Diagnosis not present

## 2023-05-04 DIAGNOSIS — S31109A Unspecified open wound of abdominal wall, unspecified quadrant without penetration into peritoneal cavity, initial encounter: Secondary | ICD-10-CM | POA: Diagnosis not present

## 2023-05-04 LAB — CBC
HCT: 29.1 % — ABNORMAL LOW (ref 36.0–46.0)
Hemoglobin: 9.5 g/dL — ABNORMAL LOW (ref 12.0–15.0)
MCH: 28.8 pg (ref 26.0–34.0)
MCHC: 32.6 g/dL (ref 30.0–36.0)
MCV: 88.2 fL (ref 80.0–100.0)
Platelets: 81 10*3/uL — ABNORMAL LOW (ref 150–400)
RBC: 3.3 MIL/uL — ABNORMAL LOW (ref 3.87–5.11)
RDW: 16.1 % — ABNORMAL HIGH (ref 11.5–15.5)
WBC: 6.8 10*3/uL (ref 4.0–10.5)
nRBC: 0 % (ref 0.0–0.2)

## 2023-05-04 LAB — BASIC METABOLIC PANEL
Anion gap: 9 (ref 5–15)
BUN: 88 mg/dL — ABNORMAL HIGH (ref 8–23)
CO2: 22 mmol/L (ref 22–32)
Calcium: 8.1 mg/dL — ABNORMAL LOW (ref 8.9–10.3)
Chloride: 106 mmol/L (ref 98–111)
Creatinine, Ser: 4.56 mg/dL — ABNORMAL HIGH (ref 0.44–1.00)
GFR, Estimated: 9 mL/min — ABNORMAL LOW (ref >60)
Glucose, Bld: 105 mg/dL — ABNORMAL HIGH (ref 70–99)
Potassium: 4.5 mmol/L (ref 3.5–5.1)
Sodium: 137 mmol/L (ref 135–145)

## 2023-05-04 LAB — MISC LABCORP TEST (SEND OUT): Labcorp test code: 5367

## 2023-05-04 LAB — MAGNESIUM: Magnesium: 2.2 mg/dL (ref 1.7–2.4)

## 2023-05-04 LAB — PHOSPHORUS: Phosphorus: 6.8 mg/dL — ABNORMAL HIGH (ref 2.5–4.6)

## 2023-05-04 LAB — CK: Total CK: 38 U/L (ref 38–234)

## 2023-05-04 MED ORDER — PIPERACILLIN-TAZOBACTAM IN DEX 2-0.25 GM/50ML IV SOLN
2.2500 g | Freq: Three times a day (TID) | INTRAVENOUS | Status: DC
Start: 2023-05-04 — End: 2023-05-06
  Administered 2023-05-04 – 2023-05-06 (×7): 2.25 g via INTRAVENOUS
  Filled 2023-05-04 (×8): qty 50

## 2023-05-04 MED ORDER — ATORVASTATIN CALCIUM 20 MG PO TABS
40.0000 mg | ORAL_TABLET | Freq: Every day | ORAL | Status: DC
  Administered 2023-05-05 – 2023-05-13 (×9): 40 mg via ORAL
  Filled 2023-05-04 (×9): qty 2

## 2023-05-04 MED ORDER — PREGABALIN 75 MG PO CAPS
75.0000 mg | ORAL_CAPSULE | Freq: Every day | ORAL | Status: DC
Start: 2023-05-05 — End: 2023-05-13
  Administered 2023-05-05 – 2023-05-13 (×9): 75 mg via ORAL
  Filled 2023-05-04 (×9): qty 1

## 2023-05-04 NOTE — Progress Notes (Addendum)
Mobility Specialist - Progress Note   05/04/23 1221  Mobility  Activity Ambulated with assistance in hallway  Level of Assistance Standby assist, set-up cues, supervision of patient - no hands on  Assistive Device Front wheel walker  Distance Ambulated (ft) 160 ft  RLE Weight Bearing Per Provider Order WBAT  Activity Response Tolerated well  Mobility visit 1 Mobility  Mobility Specialist Start Time (ACUTE ONLY) 1142  Mobility Specialist Stop Time (ACUTE ONLY) 1150  Mobility Specialist Time Calculation (min) (ACUTE ONLY) 8 min   Pt supine upon entry, utilizing RA. Pt agreeable to OOB amb in the hallway this date, reports feeling unsure of distance for amb d/t being in bed for three days. Pt completed bed mob ModI, STS to RW and amb one lap around the NS with supervision-- no LOB. Upon return to the room pt expressed feeling fatigued and slightly SOB. Pt returned to the room, left supine with alarm set and needs within reach.  Dawn Bruce Mobility Specialist 05/04/23 12:23 PM

## 2023-05-04 NOTE — Evaluation (Signed)
Physical Therapy Evaluation Patient Details Name: Dawn Bruce MRN: [id number] DOB: May 20, 1946 Today's Date: 05/04/2023  History of Present Illness  Pt is a 77 years old admitted 04/22/23 for cellulitis of right groin. Procedure dated 04/25/23 for (1) excisional debridement of right groin and (2) application of VAC wound dressing; repeat irrigation/debridement of R groin 12/10  Clinical Impression  Patient seen for re-evaluation this date; pleasant and agreeable, motivated for recovery.  Supportive daughter at bedside throughout session.  Patient endorses ongoing pain to R hip/groin, FACES 4-6/10 with movement; improves with rest and repositioning.  R hip motion limited by pain, patient reporting generalized "pinching" sensation with hip flexion (including OOB in chair, chair position in bed). Currently completes bed mobility with close sup; sit/stand, basic transfers and gait (200') with RW, cga/close sup.  Demonstrates slow, but steady, cadence without buckling or LOB. Endorses LEs feel weak due to limited mobility and ongoing health issues, but improving with continued mobilization efforts  Would benefit from skilled PT to address above deficits and promote optimal return to PLOF.; recommend post-acute PT follow up as indicated by interdisciplinary care team.            If plan is discharge home, recommend the following: A little help with walking and/or transfers;A little help with bathing/dressing/bathroom;Help with stairs or ramp for entrance;Assist for transportation   Can travel by private vehicle        Equipment Recommendations    Recommendations for Other Services       Functional Status Assessment Patient has had a recent decline in their functional status and demonstrates the ability to make significant improvements in function in a reasonable and predictable amount of time.     Precautions / Restrictions Precautions Precautions: Fall Restrictions Weight Bearing  Restrictions Per Provider Order: No      Mobility  Bed Mobility Overal bed mobility: Needs Assistance Bed Mobility: Supine to Sit, Sit to Supine     Supine to sit: Supervision Sit to supine: Supervision   General bed mobility comments: encouraged log rolling for comfort with transitional movements; able to negotiate R LE in/out of bed without physical assist this date    Transfers Overall transfer level: Needs assistance Equipment used: Rolling walker (2 wheels) Transfers: Sit to/from Stand, Bed to chair/wheelchair/BSC Sit to Stand: Contact guard assist, Supervision Stand pivot transfers: Contact guard assist, Supervision              Ambulation/Gait Ambulation/Gait assistance: Supervision Gait Distance (Feet): 200 Feet Assistive device: Rolling walker (2 wheels)         General Gait Details: slow, but steady, cadence without buckling or LOB.  Endorses LEs feel weak due to limited mobility and ongoing health issues, but improving with continued mobilization efforts  Stairs            Wheelchair Mobility     Tilt Bed    Modified Rankin (Stroke Patients Only)       Balance Overall balance assessment: Needs assistance Sitting-balance support: No upper extremity supported, Feet supported       Standing balance support: Bilateral upper extremity supported Standing balance-Leahy Scale: Good                               Pertinent Vitals/Pain Pain Assessment Pain Assessment: Faces Faces Pain Scale: Hurts little more Pain Location: R groin region Pain Descriptors / Indicators: Aching, Constant Pain Intervention(s): Repositioned, Limited activity within patient's tolerance,  Monitored during session    Home Living Family/patient expects to be discharged to:: Private residence Living Arrangements: Spouse/significant other Available Help at Discharge: Family Type of Home: House Home Access: Level entry       Home Layout: One  level Home Equipment: Shower seat;Grab bars - Chartered loss adjuster (2 wheels);Grab bars - toilet;BSC/3in1 Additional Comments: in the process of getting grab bars for the toilet    Prior Function Prior Level of Function : Independent/Modified Independent;Driving             Mobility Comments: independent without device ADLs Comments: Pt reports IND prior to admission     Extremity/Trunk Assessment   Upper Extremity Assessment Upper Extremity Assessment: Overall WFL for tasks assessed    Lower Extremity Assessment Lower Extremity Assessment: Generalized weakness (R hip movement limited due to pain and wound vac/dressing)       Communication   Communication Communication: No apparent difficulties  Cognition Arousal: Alert Behavior During Therapy: WFL for tasks assessed/performed Overall Cognitive Status: Within Functional Limits for tasks assessed                                 General Comments: alert and oriented x4. supportive spouse and daughter at bedside        General Comments      Exercises Other Exercises Other Exercises: Toilet transfer, SPT with RW, cga/close sup; sit/stand from various seating surfaces (edge of bed, BSC), close sup Other Exercises: Standing balance for hygiene/pericare, close sup   Assessment/Plan    PT Assessment Patient needs continued PT services  PT Problem List Decreased activity tolerance;Decreased mobility;Pain;Decreased skin integrity;Decreased range of motion;Decreased strength       PT Treatment Interventions DME instruction;Gait training;Stair training;Functional mobility training;Therapeutic activities;Balance training;Neuromuscular re-education;Therapeutic exercise;Patient/family education    PT Goals (Current goals can be found in the Care Plan section)  Acute Rehab PT Goals Patient Stated Goal: to go home PT Goal Formulation: With patient/family Time For Goal Achievement: 05/18/23 Potential to  Achieve Goals: Good    Frequency Min 1X/week     Co-evaluation               AM-PAC PT "6 Clicks" Mobility  Outcome Measure Help needed turning from your back to your side while in a flat bed without using bedrails?: None Help needed moving from lying on your back to sitting on the side of a flat bed without using bedrails?: A Little Help needed moving to and from a bed to a chair (including a wheelchair)?: None Help needed standing up from a chair using your arms (e.g., wheelchair or bedside chair)?: A Little Help needed to walk in hospital room?: A Little Help needed climbing 3-5 steps with a railing? : A Little 6 Click Score: 20    End of Session   Activity Tolerance: Patient tolerated treatment well Patient left: in bed;with call bell/phone within reach;with bed alarm set;with family/visitor present Nurse Communication: Mobility status PT Visit Diagnosis: Unsteadiness on feet (R26.81);Other abnormalities of gait and mobility (R26.89);Muscle weakness (generalized) (M62.81);Difficulty in walking, not elsewhere classified (R26.2);Pain Pain - Right/Left: Right Pain - part of body: Leg    Time: 1351-1415 PT Time Calculation (min) (ACUTE ONLY): 24 min   Charges:   PT Evaluation $PT Re-evaluation: 1 Re-eval PT Treatments $Therapeutic Activity: 8-22 mins PT General Charges $$ ACUTE PT VISIT: 1 Visit        Derian Pfost H. Manson Passey, PT,  DPT, NCS 05/04/23, 9:57 PM [phone]

## 2023-05-04 NOTE — Progress Notes (Signed)
Date of Admission:  04/22/2023     ID: Dawn Bruce is a 77 y.o. female  Principal Problem:   Cellulitis of right groin Active Problems:   Chronic pain syndrome   Essential hypertension   HLD (hyperlipidemia)   Chronic diastolic heart failure (HCC)   Sepsis (HCC)   CAD (coronary artery disease)   Chronic kidney disease, stage 3a (HCC)   Peritoneal hematoma   Pleural effusion   Abnormal LFTs   Hypokalemia   Atrial fibrillation, chronic (HCC)   Diarrhea   HFrEF (heart failure with reduced ejection fraction) (HCC)   Severe sepsis (HCC)   Infected wound   Cellulitis of right thigh   Acute kidney injury superimposed on stage 3a chronic kidney disease (HCC)   Metabolic acidosis   Overweight (BMI 25.0-29.9)   Antibiotic 12/2-12/9- cefepime + linezolid 12/9= zosyn  Dawn Bruce is a 77 y.o. female with a history of CAD, HTN, LD was recently in [facility] between 11/21-11/29 for chest pain and found to have STEMI, underwent angio on 11/22 and ostial LADstent placement- complicated by retroperitoneal hematoma, hemorrhagic shock needing  repair of rt external iliac and circumflex artery by Dr.Esco on 11/222/4. She also received PRBC and was in ICU- She was discharge don 04/20/23 and called vascular who prescribed keflex on 11/30. She came back to the ED on 12/1 with infected wound rt groin. Culture sent and was started on Iv vanco and ceftriaxone and I am asked to see the patient.   Subjective: She has no complaints  Medications:   alum & mag hydroxide-simeth  15 mL Oral Once   apixaban  2.5 mg Oral BID   aspirin EC  81 mg Oral Daily   atorvastatin  80 mg Oral Daily   Chlorhexidine Gluconate Cloth  6 each Topical Daily   clopidogrel  75 mg Oral Daily   ezetimibe  10 mg Oral Daily   fentaNYL  1 patch Transdermal Q72H    HYDROmorphone (DILAUDID) injection  0.5 mg Intravenous Once   pregabalin  75 mg Oral BID   sodium chloride flush  10-40 mL Intracatheter Q12H   traZODone  50 mg Oral  QHS    Objective: Vital signs in last 24 hours: Patient Vitals for the past 24 hrs:  BP Temp Temp src Pulse Resp SpO2 Weight  05/04/23 0843 136/72 98 F (36.7 C) -- 92 18 99 % --  05/04/23 0450 -- -- -- -- -- -- 75.2 kg  05/04/23 0428 (!) 132/51 97.9 F (36.6 C) Oral 92 20 99 % --  05/03/23 2020 -- -- -- -- -- 96 % --  05/03/23 1900 (!) 142/64 98 F (36.7 C) Oral 88 18 97 % --  05/03/23 1609 (!) 161/64 98 F (36.7 C) -- 91 18 99 % --      PHYSICAL EXAM:  General: awake and alert Chest b/l air entry [id number] Rt groin wound     Extremities: bruising thighs Skin: as above Lymph: Cervical, supraclavicular normal. Neurologic: Grossly non-focal  Lab Results    Latest Ref Rng & Units 05/04/2023    5:30 AM 05/03/2023    6:59 AM 05/02/2023    5:50 AM  CBC  WBC 4.0 - 10.5 K/uL 6.8  7.8  8.0   Hemoglobin 12.0 - 15.0 g/dL 9.5  9.8  7.6   Hematocrit 36.0 - 46.0 % 29.1  30.3  23.5   Platelets 150 - 400 K/uL 81  117  166  Latest Ref Rng & Units 05/04/2023    5:30 AM 05/03/2023    6:59 AM 05/02/2023    5:50 AM  CMP  Glucose 70 - 99 mg/dL 161  096  045   BUN 8 - 23 mg/dL 88  79  55   Creatinine 0.44 - 1.00 mg/dL 4.09  8.11  9.14   Sodium 135 - 145 mmol/L 137  137  139   Potassium 3.5 - 5.1 mmol/L 4.5  4.6  4.5   Chloride 98 - 111 mmol/L 106  106  108   CO2 22 - 32 mmol/L 22  21  23    Calcium 8.9 - 10.3 mg/dL 8.1  8.2  8.4       Microbiology: Wound culture E.coli, proteus morganella Surgical culture enterococcus fecalis  Studies/Results:   DG Chest Port 1 View Result Date: 05/03/2023 CLINICAL DATA:  Interstitial edema, hypotension EXAM: PORTABLE CHEST 1 VIEW COMPARISON:  05/01/2023 FINDINGS: Patchy left lower lobe opacity, favoring atelectasis. Small bilateral pleural effusions, left greater than right. This appearance is unchanged from recent prior. No frank interstitial edema.  No pneumothorax. Heart is normal in size Right arm PICC terminates in the lower  SVC. IMPRESSION: Patchy left lower lobe opacity, favoring atelectasis. Small bilateral pleural effusions, left greater than right. No frank interstitial edema. Electronically Signed   By: Charline Bills M.D.   On: 05/03/2023 20:15     Assessment/Plan: Infected groin wound at the site of surgery with dehiscence of wound and surrounding cellulitis Culture polymicrobial infection ( 3 gram neg rods and enteococcus debridement done .has wound vac On  zosyn day 12 of antibiotic  On discharge she has CIPRO+ augmentin option ( both are BID meds but the dose will have to adjusted according to crcl- check with pharmacist if discharged during weekend. Total of 4 weeks of antibiotic until 05/20/23  AKI on CKD worsening - seen by nephrologist- multifactorial etiology- IV contrast, diuretics   Left pleural effusion-    Hematoma both retroperitoneal and superficial at the site of cardiac cath    Recent hemorrhagic shock needing PRBc   Injury to external iliac artery on the right which was repaired   CAD s/p stent ( restenosis of ostial LAD stent)? Afib   Discussed the management with care team   ID will follow her peripherally this weekend- Call the on call physician if needed

## 2023-05-04 NOTE — Progress Notes (Signed)
Progress Note    05/04/2023 7:37 AM 3 Days Post-Op  Subjective:   Dawn Bruce is a 77 yo female who presents to [facility] emergency room with post operative right groin infection.  Patient is now postop day 7 from right groin incision and drainage with wound washout and wound VAC placement for the fourth time. Wound vac change at the bedside yesterday with IV Dilaudid 1 mg. Patient tolerated well. Patient is resting comfortably in bed this afternoon.  Wound VAC is in place and working well.  Noted serosanguineous drainage to canister and the about 55 mL.  Patient does endorse some pain and soreness to her right groin but states it is manageable.  Patient remains on her 2.5 mg of Eliquis twice daily for her atrial fibrillation and continues on ASA 81 mg daily and Plavix 75 mg daily post surgical repair of her right femoral artery.    Vitals:   05/03/23 2020 05/04/23 0428  BP:  (!) 132/51  Pulse:  92  Resp:  20  Temp:  97.9 F (36.6 C)  SpO2: 96% 99%   Physical Exam: Cardiac:  Hx of Atrial Fibrillation. Regular Rate  Lungs: Bilateral lungs with diminished breath sounds bases. Mild Dyspnea with cough. No wheezing noted. Non Labored breathing this morning. No oxygen required.  Incisions:  Right groin with wound VAC in place and working well.  Maintain suction and noted 60 mL serosanguineous drainage now. Extremities:  Bilateral lower extremities warm to touch. Palpable pulses. Noted +1 edema to right lower extremity.  Abdomen:  Positive bowel sounds throughout, soft and non tender and non distended.  Neurologic: AAOX4, Follows commands.   CBC    Component Value Date/Time   WBC 6.8 05/04/2023 0530   RBC 3.30 (L) 05/04/2023 0530   HGB 9.5 (L) 05/04/2023 0530   HGB 13.6 06/01/2022 0000   HCT 29.1 (L) 05/04/2023 0530   HCT 40.5 06/01/2022 0000   PLT 81 (L) 05/04/2023 0530   PLT 132 (L) 06/01/2022 0000   MCV 88.2 05/04/2023 0530   MCV 90 06/01/2022 0000   MCH 28.8 05/04/2023 0530    MCHC 32.6 05/04/2023 0530   RDW 16.1 (H) 05/04/2023 0530   RDW 12.8 06/01/2022 0000   LYMPHSABS 0.7 04/22/2023 1528   LYMPHSABS 0.7 08/30/2021 0917   MONOABS 1.2 (H) 04/22/2023 1528   EOSABS 0.2 04/22/2023 1528   EOSABS 0.0 08/30/2021 0917   BASOSABS 0.0 04/22/2023 1528   BASOSABS 0.0 08/30/2021 0917    BMET    Component Value Date/Time   NA 137 05/04/2023 0530   NA 147 (H) 06/01/2022 0000   K 4.5 05/04/2023 0530   CL 106 05/04/2023 0530   CO2 22 05/04/2023 0530   GLUCOSE 105 (H) 05/04/2023 0530   BUN 88 (H) 05/04/2023 0530   BUN 23 06/01/2022 0000   CREATININE 4.56 (H) 05/04/2023 0530   CALCIUM 8.1 (L) 05/04/2023 0530   GFRNONAA 9 (L) 05/04/2023 0530   GFRAA 54 (L) 10/08/2019 1419    INR    Component Value Date/Time   INR 1.5 (H) 04/22/2023 1540     Intake/Output Summary (Last 24 hours) at 05/04/2023 0737 Last data filed at 05/04/2023 2130 Gross per 24 hour  Intake 400 ml  Output 480 ml  Net -80 ml     Assessment/Plan:  77 y.o. female is s/p right groin postop infection with incision and drainage of wound with wound VAC placement x 4.  3 Days Post-Op   PLAN: Dr.  Vilinda Flake plans on changing the wound VAC at the patient's bedside Monday 05/07/23. Continue anticoagulation as ordered. Continue work with PT/OT for preparation of possible discharge next week.   DVT prophylaxis: Eliquis 2.5 mg twice daily, aspirin 81 mg daily, Plavix 75 mg daily.   [name] 05/04/2023 7:37 AM

## 2023-05-04 NOTE — Progress Notes (Signed)
[facility]  ROUNDING NOTE   Subjective:   Dawn Bruce  is a 77 y.o. female with medical problems of hypertension, heart failure with preserved ejection fraction, history of pleural effusions, history of diffuse vascular disease including coronary artery disease-history of PCI in LAD stent October 2023, chronic kidney disease, prediabetes, atrial fibrillation status post cardioversion in 2022 followed by Dr. Alyson Locket at [facility] nephrology. Patient presents to the ED with hypotension and suspected wound infection. She has been admitted for Cellulitis of right groin [L03.314] Cellulitis of right thigh [L03.115]  Patient is known to our practice from previous admission.   Patient seen resting in bed, son at bedside Appetite remains poor Patient complains of weakness and fatigue   Objective:  Vital signs in last 24 hours:  Temp:  [97.9 F (36.6 C)-98 F (36.7 C)] 98 F (36.7 C) (12/13 1426) Pulse Rate:  [88-98] 98 (12/13 1426) Resp:  [18-20] 18 (12/13 1426) BP: (132-161)/(51-72) 134/69 (12/13 1426) SpO2:  [96 %-100 %] 100 % (12/13 1426) Weight:  [75.2 kg] 75.2 kg (12/13 0450)  Weight change:  Filed Weights   04/30/23 0214 05/01/23 0500 05/04/23 0450  Weight: 71.4 kg 68.6 kg 75.2 kg    Intake/Output: I/O last 3 completed shifts: In: 600 [P.O.:250; IV Piggyback:350] Out: 480 [Urine:300; Drains:180]   Intake/Output this shift:  No intake/output data recorded.  Physical Exam: General: NAD  Head: Normocephalic, atraumatic. Moist oral mucosal membranes  Eyes: Anicteric  Lungs:  Clear to auscultation.normal effort  Heart: Regular rate and rhythm  Abdomen:  Soft, nontender,   Extremities:  No peripheral edema.  Neurologic: Alert and oriented, moving all four extremities  Skin: No lesions       Basic Metabolic Panel: Recent Labs  Lab 04/30/23 1630 05/01/23 0456 05/02/23 0550 05/03/23 0659 05/04/23 0530  NA 137 138 139 137 137  K 4.1 3.9 4.5 4.6 4.5  CL  110 107 108 106 106  CO2 18* 25 23 21* 22  GLUCOSE 110* 116* 120* 105* 105*  BUN 38* 43* 55* 79* 88*  CREATININE 1.66* 1.72* 2.78* 3.95* 4.56*  CALCIUM 8.4* 8.7* 8.4* 8.2* 8.1*  MG  --   --   --  2.3 2.2  PHOS  --   --   --   --  6.8*    Liver Function Tests: Recent Labs  Lab 04/30/23 1630  AST 24  ALT 25  ALKPHOS 59  BILITOT 0.9  PROT 4.8*  ALBUMIN 2.1*   No results for input(s): "LIPASE", "AMYLASE" in the last 168 hours. No results for input(s): "AMMONIA" in the last 168 hours.  CBC: Recent Labs  Lab 04/28/23 0610 05/01/23 0456 05/02/23 0550 05/03/23 0659 05/04/23 0530  WBC 11.0* 8.9 8.0 7.8 6.8  HGB 9.2* 8.7* 7.6* 9.8* 9.5*  HCT 29.1* 26.7* 23.5* 30.3* 29.1*  MCV 88.7 86.4 88.3 90.2 88.2  PLT 264 226 166 117* 81*    Cardiac Enzymes: Recent Labs  Lab 05/04/23 0530  CKTOTAL 38    BNP: Invalid input(s): "POCBNP"  CBG: Recent Labs  Lab 04/28/23 2109 04/29/23 0804 04/29/23 1648  GLUCAP 98 108* 101*    Microbiology: Results for orders placed or performed during the hospital encounter of 04/22/23  Blood Culture (routine x 2)     Status: None   Collection Time: 04/22/23  3:28 PM   Specimen: BLOOD  Result Value Ref Range Status   Specimen Description BLOOD RIGHT ANTECUBITAL  Final   Special Requests   Final  BOTTLES DRAWN AEROBIC AND ANAEROBIC Blood Culture results may not be optimal due to an excessive volume of blood received in culture bottles   Culture   Final    NO GROWTH 5 DAYS Performed at [facility], [address] [place], [place] [address]    Report Status 04/27/2023 FINAL  Final  C Difficile Quick Screen w PCR reflex     Status: None   Collection Time: 04/22/23  4:19 PM   Specimen: STOOL  Result Value Ref Range Status   C Diff antigen NEGATIVE NEGATIVE Final   C Diff toxin NEGATIVE NEGATIVE Final   C Diff interpretation No C. difficile detected.  Final    Comment: Performed at [facility], [address] [place], [place] [address]  Gastrointestinal Panel by PCR , Stool     Status: None   Collection Time: 04/22/23  4:25 PM   Specimen: Stool  Result Value Ref Range Status   Campylobacter species NOT DETECTED NOT DETECTED Final   Plesimonas shigelloides NOT DETECTED NOT DETECTED Final   Salmonella species NOT DETECTED NOT DETECTED Final   Yersinia enterocolitica NOT DETECTED NOT DETECTED Final   Vibrio species NOT DETECTED NOT DETECTED Final   Vibrio cholerae NOT DETECTED NOT DETECTED Final   Enteroaggregative E coli (EAEC) NOT DETECTED NOT DETECTED Final   Enteropathogenic E coli (EPEC) NOT DETECTED NOT DETECTED Final   Enterotoxigenic E coli (ETEC) NOT DETECTED NOT DETECTED Final   Shiga like toxin producing E coli (STEC) NOT DETECTED NOT DETECTED Final   Shigella/Enteroinvasive E coli (EIEC) NOT DETECTED NOT DETECTED Final   Cryptosporidium NOT DETECTED NOT DETECTED Final   Cyclospora cayetanensis NOT DETECTED NOT DETECTED Final   Entamoeba histolytica NOT DETECTED NOT DETECTED Final   Giardia lamblia NOT DETECTED NOT DETECTED Final   Adenovirus F40/41 NOT DETECTED NOT DETECTED Final   Astrovirus NOT DETECTED NOT DETECTED Final   Norovirus GI/GII NOT DETECTED NOT DETECTED Final   Rotavirus A NOT DETECTED NOT DETECTED Final   Sapovirus (I, II, IV, and V) NOT DETECTED NOT DETECTED Final    Comment: Performed at [facility], [address] [place], [place] [address]  Blood Culture (routine x 2)     Status: None   Collection Time: 04/23/23  6:31 AM   Specimen: BLOOD  Result Value Ref Range Status   Specimen Description BLOOD BLOOD RIGHT HAND  Final   Special Requests   Final    BOTTLES DRAWN AEROBIC AND ANAEROBIC Blood Culture results may not be optimal due to an inadequate volume of blood received in culture bottles   Culture   Final    NO GROWTH 5 DAYS Performed at [facility], [address] [place], [place] [address]    Report Status 04/28/2023 FINAL  Final   Aerobic Culture w Gram Stain (superficial specimen)     Status: None   Collection Time: 04/23/23 10:42 AM   Specimen: Groin  Result Value Ref Range Status   Specimen Description   Final    GROIN Performed at [facility], [address], [place], [place] [address]    Special Requests   Final    NONE Performed at [facility], [address] [place], [place] [address]    Gram Stain   Final    RARE WBC PRESENT, PREDOMINANTLY PMN RARE GRAM NEGATIVE RODS RARE GRAM NEGATIVE COCCOBACILLI Performed at [facility], [address], [place], [place] [address]  Culture   Final    MODERATE MORGANELLA MORGANII FEW ESCHERICHIA COLI FEW PROTEUS MIRABILIS    Report Status 04/26/2023 FINAL  Final   Organism ID, Bacteria ESCHERICHIA COLI  Final   Organism ID, Bacteria MORGANELLA MORGANII  Final   Organism ID, Bacteria PROTEUS MIRABILIS  Final      Susceptibility   Escherichia coli - MIC*    AMPICILLIN >=32 RESISTANT Resistant     CEFEPIME <=0.12 SENSITIVE Sensitive     CEFTAZIDIME <=1 SENSITIVE Sensitive     CEFTRIAXONE <=0.25 SENSITIVE Sensitive     CIPROFLOXACIN <=0.25 SENSITIVE Sensitive     GENTAMICIN <=1 SENSITIVE Sensitive     IMIPENEM <=0.25 SENSITIVE Sensitive     TRIMETH/SULFA <=20 SENSITIVE Sensitive     AMPICILLIN/SULBACTAM 16 INTERMEDIATE Intermediate     PIP/TAZO <=4 SENSITIVE Sensitive ug/mL    * FEW ESCHERICHIA COLI   Morganella morganii - MIC*    AMPICILLIN >=32 RESISTANT Resistant     CEFTAZIDIME <=1 SENSITIVE Sensitive     CIPROFLOXACIN <=0.25 SENSITIVE Sensitive     GENTAMICIN <=1 SENSITIVE Sensitive     IMIPENEM 4 SENSITIVE Sensitive     TRIMETH/SULFA <=20 SENSITIVE Sensitive     AMPICILLIN/SULBACTAM 16 INTERMEDIATE Intermediate     PIP/TAZO <=4 SENSITIVE Sensitive ug/mL    * MODERATE MORGANELLA MORGANII   Proteus mirabilis - MIC*    AMPICILLIN <=2 SENSITIVE Sensitive     CEFEPIME <=0.12 SENSITIVE Sensitive     CEFTAZIDIME <=1  SENSITIVE Sensitive     CEFTRIAXONE <=0.25 SENSITIVE Sensitive     CIPROFLOXACIN <=0.25 SENSITIVE Sensitive     GENTAMICIN <=1 SENSITIVE Sensitive     IMIPENEM 2 SENSITIVE Sensitive     TRIMETH/SULFA <=20 SENSITIVE Sensitive     AMPICILLIN/SULBACTAM <=2 SENSITIVE Sensitive     PIP/TAZO <=4 SENSITIVE Sensitive ug/mL    * FEW PROTEUS MIRABILIS  Aerobic/Anaerobic Culture w Gram Stain (surgical/deep wound)     Status: None   Collection Time: 04/25/23  2:41 PM   Specimen: Wound; Body Fluid  Result Value Ref Range Status   Specimen Description   Final    WOUND Performed at [facility], [address] [place], [place] [address]    Special Requests RETROPERITONEAL HEMATOMA SPEC A  Final   Gram Stain   Final    FEW WBC PRESENT,BOTH PMN AND MONONUCLEAR NO ORGANISMS SEEN    Culture   Final    RARE ENTEROCOCCUS FAECALIS NO ANAEROBES ISOLATED Performed at [facility], [address] [place], [place] [address]    Report Status 05/02/2023 FINAL  Final   Organism ID, Bacteria ENTEROCOCCUS FAECALIS  Final      Susceptibility   Enterococcus faecalis - MIC*    AMPICILLIN <=2 SENSITIVE Sensitive     VANCOMYCIN 2 SENSITIVE Sensitive     GENTAMICIN SYNERGY SENSITIVE Sensitive     * RARE ENTEROCOCCUS FAECALIS    Coagulation Studies: No results for input(s): "LABPROT", "INR" in the last 72 hours.  Urinalysis: No results for input(s): "COLORURINE", "LABSPEC", "PHURINE", "GLUCOSEU", "HGBUR", "BILIRUBINUR", "KETONESUR", "PROTEINUR", "UROBILINOGEN", "NITRITE", "LEUKOCYTESUR" in the last 72 hours.  Invalid input(s): "APPERANCEUR"    Imaging: DG Chest Port 1 View Result Date: 05/03/2023 CLINICAL DATA:  Interstitial edema, hypotension EXAM: PORTABLE CHEST 1 VIEW COMPARISON:  05/01/2023 FINDINGS: Patchy left lower lobe opacity, favoring atelectasis. Small bilateral pleural effusions, left greater than right. This appearance is unchanged from recent prior. No frank interstitial  edema.  No pneumothorax. Heart is  normal in size Right arm PICC terminates in the lower SVC. IMPRESSION: Patchy left lower lobe opacity, favoring atelectasis. Small bilateral pleural effusions, left greater than right. No frank interstitial edema. Electronically Signed   By: Charline Bills M.D.   On: 05/03/2023 20:15     Medications:    piperacillin-tazobactam (ZOSYN)  IV      alum & mag hydroxide-simeth  15 mL Oral Once   apixaban  2.5 mg Oral BID   aspirin EC  81 mg Oral Daily   [START ON 05/05/2023] atorvastatin  40 mg Oral Daily   Chlorhexidine Gluconate Cloth  6 each Topical Daily   clopidogrel  75 mg Oral Daily   ezetimibe  10 mg Oral Daily   fentaNYL  1 patch Transdermal Q72H    HYDROmorphone (DILAUDID) injection  0.5 mg Intravenous Once   [START ON 05/05/2023] pregabalin  75 mg Oral Daily   sodium chloride flush  10-40 mL Intracatheter Q12H   traZODone  50 mg Oral QHS   acetaminophen, albuterol, oxyCODONE, phenylephrine, sodium chloride flush  Assessment/ Plan:  Ms. Dawn Bruce is a 77 y.o.  female with medical problems of hypertension, heart failure with preserved ejection fraction, history of pleural effusions, history of diffuse vascular disease including coronary artery disease-history of PCI in LAD stent October 2023, chronic kidney disease, prediabetes, atrial fibrillation status post cardioversion in 2022 followed by Dr. Alyson Locket at [facility] nephrology. Patient presents to the ED with hypotension and suspected wound infection. She has been admitted for Cellulitis of right groin [L03.314] Cellulitis of right thigh [L03.115]   Acute Kidney Injury on chronic kidney disease stage IIIb with baseline creatinine 1.3 and GFR of 42 on 03/22/23.  Acute kidney injury appears multifactorial, hypotension, IV contrast exposure, and diuretics. Creatinine on admission, 1.17.  Patient experienced hypotension prior to ED arrival.  IV contrast exposure on 04/22/2023.  Received approximately 2  days of diuresis.    Creatinine continues to worsen today, poor urine output recorded.  Discussed the possibility of temporary dialysis if renal function continues to worsen.  Patient and family will discuss this.  Will monitor a.m. labs.  Would consider gentle IV hydration however concern for decreased heart function, will defer.  No acute indication for dialysis however monitoring closely.  Lab Results  Component Value Date   CREATININE 4.56 (H) 05/04/2023   CREATININE 3.95 (H) 05/03/2023   CREATININE 2.78 (H) 05/02/2023    Intake/Output Summary (Last 24 hours) at 05/04/2023 1458 Last data filed at 05/04/2023 0528 Gross per 24 hour  Intake 400 ml  Output 480 ml  Net -80 ml   2. Anemia of chronic kidney disease Lab Results  Component Value Date   HGB 9.5 (L) 05/04/2023    Hemoglobin at goal.    3.  Hypotension.  Blood pressure appears to have self corrected, 134/69.   LOS: 12 Dawn Bruce 12/13/20242:58 PM

## 2023-05-04 NOTE — Progress Notes (Signed)
PULMONOLOGY         Date: 05/04/2023,   MRN# [id number] Dawn Bruce February 22, 1946     AdmissionWeight: 54.4 kg                 CurrentWeight: 75.2 kg  Referring provider: Dr Gilda Crease   CHIEF COMPLAINT:   Complete atelectasis of left lung with bilateral pleural effusions.    HISTORY OF PRESENT ILLNESS   77 year old female with a history of chronic GERD, basal cell carcinoma squamous cell carcinoma history of NSTEMI diastolic CHF, CAD status post recent PCI with DES, chronic A-fib on Eliquis, essential hypertension, dyslipidemia CKD 3A chronic pain syndrome remote perforated appendicitis, hospitalized November 2024 with non-STEMI.  Post left heart cath had developed retroperitoneal hematoma and hemorrhagic shock with complications of iliac artery laceration.  Status post surgical repair via vascular surgery service.  Had outpatient therapy with antibiotics with failure came in with transient hypotension and circulatory shock.  Found to have elevated cardiac biomarkers and acute on chronic CHF.  Vascular surgery was reconsulted today and PCCM consultation was placed to incidental finding of complete atelectasis of the left lung with surrounding bilateral pleural effusions worse on the left.  04/25/23- patient evaluated in PACU post op , appears to be mildy sedated still. Mildy hypotensive at this moment.  Lung sounds much improved post 1.6L left pleural space aspiration.  04/26/23- patient seen and examined , Dawn Bruce (daughter) at bedside.  Patient receiving blood transfusion.  Lung sounds are clear , vitals are stable.  Hypoalbulminemia continues. Will order urine protein and pre albumin levels,  because patient shares she's eating well.  CXR in am to check interval effusion.  04/27/23- patient seen at bedside, husband and daughter present.  For surgery wound washout and vac exchange today.   Vitals stable , room air spO2 99%     04/30/23- patient seen at bedside.  Has  additional surgery planned with debridement.  CXR with recurring effusion and associated compressive atelelctasis.   Ideally would like to increase diuresis and recruitment maunevers.  E. Faecallis noted on wound culture.  I discussed case with Dr Avon Gully infectious disease specialist today.  Dr Dareen Piano is also considering pleurX catheter which patient may need if this does not resolve with medical management. She already had multiple thoracentesis. No noew labs are available for review but are in process. Will order purewick for urination due to physical weakness.      05/01/23-  patient reports improvement post diuresis.  Will obtain interval CXR today.  We discussed PleurX permanent tunneled catheter and she declined to have this done at the moment.  She is on room air with normoxia.  She developed mild contraction and we will take diuretic holiday.   05/02/23- patient with severe hypotension.  I have dcd her beta blocker today and imdur. She is now getting IV fluids and is able to speak.  Will start midodrine 5mg  po tid.  05/03/23- patient is now no longer hypotensive.  She had AKI and this is very likely related to circulatory shock over past 24h.  She had fluid via IV and I'm concerned regarding reacculumation of pleural fluid so I have dcd this now since she is hypertensive.  There is nephrology consult in progress.  05/04/23- patient is improved clinically, she has improved vital signs,  she does have AKI but she is making light yellow urine.  She has nephrology evaluation.  There is consideration for HD.  PAST MEDICAL HISTORY   Past Medical History:  Diagnosis Date   Actinic keratosis    Chronic pain    GERD (gastroesophageal reflux disease) ?   Taking Pepcid   History of basal cell carcinoma (BCC) 05/02/2021   right upper back paraspinal   History of SCC (squamous cell carcinoma) of skin 09/10/2019   left dorsum wrist ED&C done 10/28/19   History of SCC (squamous cell  carcinoma) of skin 03/04/2020   right dorsum hand   History of squamous cell carcinoma in situ (SCCIS) 03/04/2020   left dorsum wrist  ED&C   History of squamous cell carcinoma in situ (SCCIS) 03/04/2020   right medial infraorbital  ED&C 04/28/2020   History of squamous cell carcinoma in situ (SCCIS) 10/28/2019   right dorsum hand proximal lateral   History of squamous cell carcinoma in situ (SCCIS) 10/28/2019   right dorsum hand proximal medial   Hyperlipidemia    Hypertension    NSTEMI (non-ST elevated myocardial infarction) (HCC) 04/12/2023   Renal disorder    Squamous cell carcinoma in situ 10/31/2021   left distal  tricep, EDC   Squamous cell carcinoma of skin 08/28/2022   Right volar forearm - [facility]     SURGICAL HISTORY   Past Surgical History:  Procedure Laterality Date   ABDOMINAL HYSTERECTOMY     APPENDECTOMY  April 2023   Burst appendix   APPLICATION OF WOUND VAC Right 04/25/2023   Procedure: APPLICATION OF WOUND VAC;  Surgeon: Renford Dills, MD;  Location: [facility];  Service: Vascular;  Laterality: Right;   APPLICATION OF WOUND VAC Right 04/27/2023   Procedure: WOUND VAC EXCHANGE;  Surgeon: Renford Dills, MD;  Location: [facility];  Service: Vascular;  Laterality: Right;   APPLICATION OF WOUND VAC Right 05/01/2023   Procedure: APPLICATION OF WOUND VAC;  Surgeon: Renford Dills, MD;  Location: [facility];  Service: Vascular;  Laterality: Right;   AUGMENTATION MAMMAPLASTY Bilateral    25-30 years ago   CARDIOVERSION N/A 12/01/2020   Procedure: CARDIOVERSION;  Surgeon: Debbe Odea, MD;  Location: [facility];  Service: Cardiovascular;  Laterality: N/A;   CAROTID ARTERY ANGIOPLASTY Right 1995(approximate)   CAROTID ENDARTERECTOMY  2001   CORONARY STENT INTERVENTION N/A 04/13/2023   Procedure: CORONARY STENT INTERVENTION;  Surgeon: Marcina Millard, MD;  Location: [facility];  Service: Cardiovascular;  Laterality: N/A;   COSMETIC SURGERY      ELBOW SURGERY Left    ENDARTERECTOMY FEMORAL Right 04/13/2023   Procedure: Right groin exploration with repair of right external iliac artery and right circumflex artery;  Surgeon: Bertram Denver, MD;  Location: [facility];  Service: Vascular;  Laterality: Right;   FRACTURE SURGERY  2016   Arm   INCISION AND DRAINAGE OF WOUND Right 04/25/2023   Procedure: IRRIGATION AND DEBRIDEMENT WOUND;  Surgeon: Renford Dills, MD;  Location: [facility];  Service: Vascular;  Laterality: Right;   INCISION AND DRAINAGE OF WOUND Right 04/27/2023   Procedure: IRRIGATION AND DEBRIDEMENT WOUND;  Surgeon: Renford Dills, MD;  Location: [facility];  Service: Vascular;  Laterality: Right;   INCISION AND DRAINAGE OF WOUND Right 05/01/2023   Procedure: IRRIGATION AND DEBRIDEMENT WOUND;  Surgeon: Renford Dills, MD;  Location: [facility];  Service: Vascular;  Laterality: Right;   LEFT HEART CATH AND CORONARY ANGIOGRAPHY N/A 04/13/2023   Procedure: LEFT HEART CATH AND CORONARY ANGIOGRAPHY;  Surgeon: Marcina Millard, MD;  Location: [facility];  Service: Cardiovascular;  Laterality: N/A;   THORACENTESIS N/A 12/27/2020   Procedure: THORACENTESIS;  Surgeon: Josephine Igo, DO;  Location: [facility];  Service: Pulmonary;  Laterality: N/A;   TONSILLECTOMY     TUBAL LIGATION  ?     FAMILY HISTORY   Family History  Problem Relation Age of Onset   Cancer Mother    Cancer Father    Cancer Sister    Breast cancer Neg Hx      SOCIAL HISTORY   Social History   Tobacco Use   Smoking status: Former    Current packs/day: 0.00    Average packs/day: 0.3 packs/day for 15.0 years (3.8 ttl pk-yrs)    Types: Cigarettes    Start date: 4    Quit date: 70    Years since quitting: 34.9    Passive exposure: Past   Smokeless tobacco: Never   Tobacco comments:    quit around 1990-1995 (?)  Vaping Use   Vaping status: Never Used  Substance Use Topics   Alcohol use: Yes    Alcohol/week: 14.0  standard drinks of alcohol    Types: 14 Standard drinks or equivalent per week    Comment: 2 scotch shots   Drug use: Never     MEDICATIONS    Home Medication:     Current Medication:  Current Facility-Administered Medications:    acetaminophen (TYLENOL) tablet 650 mg, 650 mg, Oral, Q6H PRN, Schnier, Latina Craver, MD, 650 mg at 05/04/23 1016   albuterol (PROVENTIL) (2.5 MG/3ML) 0.083% nebulizer solution 2.5 mg, 2.5 mg, Nebulization, Q4H PRN, Schnier, Latina Craver, MD, 2.5 mg at 05/04/23 0724   alum & mag hydroxide-simeth (MAALOX/MYLANTA) 200-200-20 MG/5ML suspension 15 mL, 15 mL, Oral, Once, Schnier, Latina Craver, MD   apixaban Everlene Balls) tablet 2.5 mg, 2.5 mg, Oral, BID, Schnier, Latina Craver, MD, 2.5 mg at 05/04/23 1017   aspirin EC tablet 81 mg, 81 mg, Oral, Daily, Schnier, Latina Craver, MD, 81 mg at 05/04/23 1017   atorvastatin (LIPITOR) tablet 80 mg, 80 mg, Oral, Daily, Schnier, Latina Craver, MD, 80 mg at 05/04/23 1016   Chlorhexidine Gluconate Cloth 2 % PADS 6 each, 6 each, Topical, Daily, Schnier, Latina Craver, MD, 6 each at 05/03/23 1133   clopidogrel (PLAVIX) tablet 75 mg, 75 mg, Oral, Daily, Schnier, Latina Craver, MD, 75 mg at 05/04/23 1017   ezetimibe (ZETIA) tablet 10 mg, 10 mg, Oral, Daily, Schnier, Latina Craver, MD, 10 mg at 05/04/23 1017   fentaNYL (DURAGESIC) 75 MCG/HR 1 patch, 1 patch, Transdermal, Q72H, Schnier, Latina Craver, MD, 1 patch at 05/02/23 0908   HYDROmorphone (DILAUDID) injection 0.5 mg, 0.5 mg, Intravenous, Once, Marrion Coy, MD   loperamide (IMODIUM) capsule 2 mg, 2 mg, Oral, BID PRN, Schnier, Latina Craver, MD   oxyCODONE (Oxy IR/ROXICODONE) immediate release tablet 5 mg, 5 mg, Oral, Q6H PRN, Schnier, Latina Craver, MD, 5 mg at 05/04/23 0008   phenylephrine ((USE for PREPARATION-H)) 0.25 % suppository 1 suppository, 1 suppository, Rectal, PRN, Schnier, Latina Craver, MD   piperacillin-tazobactam (ZOSYN) IVPB 2.25 g, 2.25 g, Intravenous, Q8H, Ravishankar, Rhodia Albright, MD   [START ON 05/05/2023]  pregabalin (LYRICA) capsule 75 mg, 75 mg, Oral, Daily, Zhang, Dekui, MD   sodium chloride flush (NS) 0.9 % injection 10-40 mL, 10-40 mL, Intracatheter, Q12H, Schnier, Latina Craver, MD, 10 mL at 05/04/23 1018   sodium chloride flush (NS) 0.9 % injection 10-40 mL, 10-40 mL, Intracatheter, PRN, Schnier, Latina Craver, MD   traZODone (DESYREL) tablet 50 mg, 50 mg, Oral,  QHS, Schnier, Latina Craver, MD, 50 mg at 05/04/23 0009    ALLERGIES   Patient has no known allergies.     REVIEW OF SYSTEMS    Review of Systems:  Gen:  Denies  fever, sweats, chills weigh loss  HEENT: Denies blurred vision, double vision, ear pain, eye pain, hearing loss, nose bleeds, sore throat Cardiac:  No dizziness, chest pain or heaviness, chest tightness,edema Resp:   reports dyspnea chronically  Gi: Denies swallowing difficulty, stomach pain, nausea or vomiting, diarrhea, constipation, bowel incontinence Gu:  Denies bladder incontinence, burning urine Ext:   Denies Joint pain, stiffness or swelling Skin: Denies  skin rash, easy bruising or bleeding or hives Endoc:  Denies polyuria, polydipsia , polyphagia or weight change Psych:   Denies depression, insomnia or hallucinations   Other:  All other systems negative   VS: BP 134/69 (BP Location: Right Arm)   Pulse 98   Temp 98 F (36.7 C) (Oral)   Resp 18   Ht 5' 2.99" (1.6 m)   Wt 75.2 kg   SpO2 100%   BMI 29.38 kg/m      PHYSICAL EXAM    GENERAL:NAD, no fevers, chills, no weakness no fatigue HEAD: Normocephalic, atraumatic.  EYES: Pupils equal, round, reactive to light. Extraocular muscles intact. No scleral icterus.  MOUTH: Moist mucosal membrane. Dentition intact. No abscess noted.  EAR, NOSE, THROAT: Clear without exudates. No external lesions.  NECK: Supple. No thyromegaly. No nodules. No JVD.  PULMONARY: decreased breath sounds with mild rhonchi worse at bases bilaterally.  CARDIOVASCULAR: S1 and S2. Regular rate and rhythm. No murmurs, rubs, or  gallops. No edema. Pedal pulses 2+ bilaterally.  GASTROINTESTINAL: Soft, nontender, nondistended. No masses. Positive bowel sounds. No hepatosplenomegaly.  MUSCULOSKELETAL: No swelling, clubbing, or edema. Range of motion full in all extremities.  NEUROLOGIC: Cranial nerves II through XII are intact. No gross focal neurological deficits. Sensation intact. Reflexes intact.  SKIN: No ulceration, lesions, rashes, or cyanosis. Skin warm and dry. Turgor intact.  PSYCHIATRIC: Mood, affect within normal limits. The patient is awake, alert and oriented x 3. Insight, judgment intact.       IMAGING     ASSESSMENT/PLAN    Complete atelectasis of left lung   - patient s/p diuresis with partial improvement however still has minimal lung sounds on left.   - she does have CKD 3 and may not produce adequate fluid removal via medication alone   - we discussed options for thoracentesis and patient would like to proceed with this procedure.  -incentive spirometry at bedside to be used multiple times daily  - PT/OT - etiology is likely poor nutrition post ICU hospitalization with CHF and CKD all contributing to third spaced interstitial edema , peripheral edema and effusion     Bilateral pleural effusions    - s/p throacentesis Sept 2024   - consultation for IR with US guided throacentesis - therapeutic    -patient on heparin drip -prealbumin -urine protein -holding diuresis today due to aki and will continue to monitor renal function.  I anticipate this should improve with normal BP now.    Sepsis due to right groin cellulitis   S/p vascular surgery eval S/p vanco /rocephin  - patient appears to be clinically improved.  - s/p ID revaluation - appreciate input  -E faecalis noted on wound care   Moderate proteic calorie malnutrition   - nutritional consultation    - bitemporal wasting and peripheral muscle weakness noted   -  contributing to edema/effusion   - complicating tissue healing   -high protein diet  Thank you for allowing me to participate in the care of this patient.  Patient/Family are satisfied with care plan and all questions have been answered.    Provider disclosure: Patient with at least one acute or chronic illness or injury that poses a threat to life or bodily function and is being managed actively during this encounter.  All of the below services have been performed independently by signing provider:  review of prior documentation from internal and or external health records.  Review of previous and current lab results.  Interview and comprehensive assessment during patient visit today. Review of current and previous chest radiographs/CT scans. Discussion of management and test interpretation with health care team and patient/family.   This document was prepared using Dragon voice recognition software and may include unintentional dictation errors.     Vida Rigger, M.D.  Division of Pulmonary & Critical Care Medicine

## 2023-05-04 NOTE — Progress Notes (Signed)
Progress Note   Patient: Dawn Bruce YNW:[id number] DOB: 1946-04-10 DOA: 04/22/2023     12 DOS: the patient was seen and examined on 05/04/2023   Brief hospital course: Dawn Bruce is a 77 y.o. female with medical history significant of dCHF, CAD (s/p of recent DES), A fib on Eliquis, HTN, HLD, CDK-3a, chronic pain syndrome, multiple skin cancer, perforated appendicitis, former smoker, who presents with right groin pain.  Patient was recently hospitalized from 11/21 - 11/29 due to non-STEMI. Patient is s/p of LHC with DES placement. Pt developed retroperitoneal hematoma & hemorrhagic shock due to complication of Iliac artery laceration during left heart cath of right groin. Pt underwent surgical repair of external iliac artery by [facility] on 11/23.  Presented back to ED 12/1 due to increased redness, swelling, and pain at incision site of the cath. Failed outpatient Abx x1 dose.  Hypotensive to 80/50 on presentation. 12/2: pulmonology, and IR consulted to address pleural effusion. ID consulted to address abx course. Transitioned to linezolid. Vascular evaluated and recommended debridement. 12/3: Thoracentesis removed 1.6L left side 12/4:stable ORA, afebrile. Wound clinically greatly improved. Debridement today. Plan to stop heparin gtt and return to eliquis + DAPT s/p procedure 12/6: additional debridement.  Debridement again 12/10. Now on zosyn. Doing well. Respiratory status stable ORA.  12/12.  Patient had a worsening renal function, nephrology consult obtained.   Principal Problem:   Cellulitis of right groin Active Problems:   Sepsis (HCC)   CAD (coronary artery disease)   Peritoneal hematoma   Essential hypertension   Chronic diastolic heart failure (HCC)   Hypokalemia   Chronic kidney disease, stage 3a (HCC)   Pleural effusion   Abnormal LFTs   Atrial fibrillation, chronic (HCC)   Diarrhea   Chronic pain syndrome   HLD (hyperlipidemia)   HFrEF (heart failure with reduced ejection  fraction) (HCC)   Severe sepsis (HCC)   Infected wound   Cellulitis of right thigh   Acute kidney injury superimposed on stage 3a chronic kidney disease (HCC)   Metabolic acidosis   Overweight (BMI 25.0-29.9)   Assessment and Plan:   Acute renal failure on chronic kidney disease stage IIIa. Hypokalemia. Mild metabolic acidosis. Transient hypotension. Patient has much worsening renal function with creatinine increased to 2.78 from 1.08 on 12/5.  This could be secondary to diuretics.  Patient also had an episode of hypotension yesterday, received a 500 mL normal saline bolus.  Blood pressure medicine was discontinued. Worsening renal function with creatinine went up to 3.9 11/12.  Reviewed prior renal ultrasound performed 11/24, no hydronephrosis.  Consult nephrology.   Renal function continues to get worse today, followed by nephrology.  Will continue to monitor until renal function is better.  Blood pressure is more stable.   Sepsis due to cellulitis of right groin Right groin wound infection secondary to surgical repair of pseudoaneurysm. Sepsis criteria met on admission with WBC 21.3, heart rate 104, RR 29.  Lactic acid normal 1.7.   Sepsis appears to be secondary to right groin surgical wound infection.  Patient had debridement x 2, wound-vac in place Wound culture grow E. coli, Morganella morganii, Proteus Morales, Enterococcus faecalis.  Blood culture negative.  Initially treated with linezolid + cefepime, switched to Zosyn on 12/11.  Dressing change performed by vascular surgery 12/12, wound is getting better with significant granulation. Discussed with vascular surgery and ID, will continue IV antibiotics while in the hospital.  May switch to oral antibiotics at discharge.   Hypervolemia  Bilateral  Pleural effusions Acute on chronic systolic congestive heart failure.  Hx of CAD: s/p of recent DES. Patient initially received IV Lasix, also had a thoracentesis. Volume status has  improved. Also restarted the Plavix.   Peritoneal hematoma Anemia acute on chronic. Thrombocytopenia. Hemoglobin dropped down to 7.6 12/11, received another unit of PRBC transfusion.  Normal iron, B12 level borderline, check homocystine level.  Hemoglobin went up to 9.8 today. Patient developed mild thrombocytopenia of 117 on 12/12, not currently on heparin.  This appears to be secondary to acute renal failure.  Platelet dropped down to 81, hemoglobin stable at 9.5 today.   Essential hypertension: Hold off all blood pressure medicines including beta-blocker due to hypotension.   Atrial fibrillation, chronic (HCC): rate controlled -Switch back to eliquis Hold off Coreg.   Diarrhea: C. difficile negative. -As needed Imodium   Chronic pain syndrome -Continue home fentanyl patch and Lyrica   HLD: -lipitor and zetia       Subjective:  Patient has good appetite, no nausea vomiting. No shortness of breath or cough.  Physical Exam: Vitals:   05/03/23 2020 05/04/23 0428 05/04/23 0450 05/04/23 0843  BP:  (!) 132/51  136/72  Pulse:  92  92  Resp:  20  18  Temp:  97.9 F (36.6 C)  98 F (36.7 C)  TempSrc:  Oral    SpO2: 96% 99%  99%  Weight:   75.2 kg   Height:       General exam: Appears calm and comfortable  Respiratory system: Clear to auscultation. Respiratory effort normal. Cardiovascular system: S1 & S2 heard, RRR. No JVD, murmurs, rubs, gallops or clicks. No pedal edema. Gastrointestinal system: Abdomen is nondistended, soft and nontender. No organomegaly or masses felt. Normal bowel sounds heard. Central nervous system: Alert and oriented. No focal neurological deficits. Extremities: Symmetric 5 x 5 power. Skin: No rashes, lesions or ulcers Psychiatry: Judgement and insight appear normal. Mood & affect appropriate.    Data Reviewed:  Lab results reviewed.  Family Communication: Husband updated at bedside.  Disposition: Status is: Inpatient Remains  inpatient appropriate because: Severity of disease.     Time spent: 35 minutes  Author: Marrion Coy, MD 05/04/2023 12:08 PM  For on call review [email or web address].

## 2023-05-05 DIAGNOSIS — N1831 Chronic kidney disease, stage 3a: Secondary | ICD-10-CM | POA: Diagnosis not present

## 2023-05-05 DIAGNOSIS — I10 Essential (primary) hypertension: Secondary | ICD-10-CM | POA: Diagnosis not present

## 2023-05-05 DIAGNOSIS — N179 Acute kidney failure, unspecified: Secondary | ICD-10-CM | POA: Diagnosis not present

## 2023-05-05 DIAGNOSIS — L03314 Cellulitis of groin: Secondary | ICD-10-CM | POA: Diagnosis not present

## 2023-05-05 LAB — BASIC METABOLIC PANEL
Anion gap: 10 (ref 5–15)
BUN: 91 mg/dL — ABNORMAL HIGH (ref 8–23)
CO2: 21 mmol/L — ABNORMAL LOW (ref 22–32)
Calcium: 8.3 mg/dL — ABNORMAL LOW (ref 8.9–10.3)
Chloride: 106 mmol/L (ref 98–111)
Creatinine, Ser: 4.67 mg/dL — ABNORMAL HIGH (ref 0.44–1.00)
GFR, Estimated: 9 mL/min — ABNORMAL LOW (ref >60)
Glucose, Bld: 106 mg/dL — ABNORMAL HIGH (ref 70–99)
Potassium: 4.6 mmol/L (ref 3.5–5.1)
Sodium: 137 mmol/L (ref 135–145)

## 2023-05-05 LAB — HOMOCYSTEINE: Homocysteine: 5.5 umol/L (ref 0.0–19.2)

## 2023-05-05 LAB — HEPATITIS B SURFACE ANTIGEN: Hepatitis B Surface Ag: NONREACTIVE

## 2023-05-05 NOTE — Progress Notes (Signed)
[facility]  ROUNDING NOTE   Subjective:   Dawn Bruce  is a 77 y.o. female with medical problems of hypertension, heart failure with preserved ejection fraction, history of pleural effusions, history of diffuse vascular disease including coronary artery disease-history of PCI in LAD stent October 2023, chronic kidney disease, prediabetes, atrial fibrillation status post cardioversion in 2022 followed by Dr. Alyson Locket at [facility] nephrology. Patient presents to the ED with hypotension and suspected wound infection. She has been admitted for Cellulitis of right groin [L03.314] Cellulitis of right thigh [L03.115]  Husband and daughter at bedside.   UOP - however not all has been collected.   Creatinine 4.67 (4.56)    Objective:  Vital signs in last 24 hours:  Temp:  [97.6 F (36.4 C)-98 F (36.7 C)] 97.6 F (36.4 C) (12/14 0405) Pulse Rate:  [92-98] 92 (12/14 0405) Resp:  [16-18] 16 (12/14 0405) BP: (132-141)/(65-74) 141/74 (12/14 0405) SpO2:  [94 %-100 %] 100 % (12/14 0405) Weight:  [75.8 kg] 75.8 kg (12/14 0448)  Weight change: 0.6 kg Filed Weights   05/01/23 0500 05/04/23 0450 05/05/23 0448  Weight: 68.6 kg 75.2 kg 75.8 kg    Intake/Output: I/O last 3 completed shifts: In: 500 [P.O.:250; IV Piggyback:250] Out: 1280 [Urine:1100; Drains:180]   Intake/Output this shift:  No intake/output data recorded.  Physical Exam: General: NAD  Head: Normocephalic, atraumatic. Moist oral mucosal membranes  Eyes: Anicteric  Lungs:  Clear to auscultation.normal effort  Heart: Regular rate and rhythm  Abdomen:  Soft, nontender,   Extremities:  No peripheral edema.  Neurologic: Alert and oriented, moving all four extremities  Skin: No lesions  Access none    Basic Metabolic Panel: Recent Labs  Lab 05/01/23 0456 05/02/23 0550 05/03/23 0659 05/04/23 0530 05/05/23 0525  NA 138 139 137 137 137  K 3.9 4.5 4.6 4.5 4.6  CL 107 108 106 106 106  CO2 25 23 21* 22  21*  GLUCOSE 116* 120* 105* 105* 106*  BUN 43* 55* 79* 88* 91*  CREATININE 1.72* 2.78* 3.95* 4.56* 4.67*  CALCIUM 8.7* 8.4* 8.2* 8.1* 8.3*  MG  --   --  2.3 2.2  --   PHOS  --   --   --  6.8*  --     Liver Function Tests: Recent Labs  Lab 04/30/23 1630  AST 24  ALT 25  ALKPHOS 59  BILITOT 0.9  PROT 4.8*  ALBUMIN 2.1*   No results for input(s): "LIPASE", "AMYLASE" in the last 168 hours. No results for input(s): "AMMONIA" in the last 168 hours.  CBC: Recent Labs  Lab 05/01/23 0456 05/02/23 0550 05/03/23 0659 05/04/23 0530  WBC 8.9 8.0 7.8 6.8  HGB 8.7* 7.6* 9.8* 9.5*  HCT 26.7* 23.5* 30.3* 29.1*  MCV 86.4 88.3 90.2 88.2  PLT 226 166 117* 81*    Cardiac Enzymes: Recent Labs  Lab 05/04/23 0530  CKTOTAL 38    BNP: Invalid input(s): "POCBNP"  CBG: Recent Labs  Lab 04/28/23 2109 04/29/23 0804 04/29/23 1648  GLUCAP 98 108* 101*    Microbiology: Results for orders placed or performed during the hospital encounter of 04/22/23  Blood Culture (routine x 2)     Status: None   Collection Time: 04/22/23  3:28 PM   Specimen: BLOOD  Result Value Ref Range Status   Specimen Description BLOOD RIGHT ANTECUBITAL  Final   Special Requests   Final    BOTTLES DRAWN AEROBIC AND ANAEROBIC Blood Culture results may  not be optimal due to an excessive volume of blood received in culture bottles   Culture   Final    NO GROWTH 5 DAYS Performed at [facility], [address] [place], [place] [address]    Report Status 04/27/2023 FINAL  Final  C Difficile Quick Screen w PCR reflex     Status: None   Collection Time: 04/22/23  4:19 PM   Specimen: STOOL  Result Value Ref Range Status   C Diff antigen NEGATIVE NEGATIVE Final   C Diff toxin NEGATIVE NEGATIVE Final   C Diff interpretation No C. difficile detected.  Final    Comment: Performed at [facility], [address] [place], [place] [address]  Gastrointestinal Panel by PCR , Stool     Status:  None   Collection Time: 04/22/23  4:25 PM   Specimen: Stool  Result Value Ref Range Status   Campylobacter species NOT DETECTED NOT DETECTED Final   Plesimonas shigelloides NOT DETECTED NOT DETECTED Final   Salmonella species NOT DETECTED NOT DETECTED Final   Yersinia enterocolitica NOT DETECTED NOT DETECTED Final   Vibrio species NOT DETECTED NOT DETECTED Final   Vibrio cholerae NOT DETECTED NOT DETECTED Final   Enteroaggregative E coli (EAEC) NOT DETECTED NOT DETECTED Final   Enteropathogenic E coli (EPEC) NOT DETECTED NOT DETECTED Final   Enterotoxigenic E coli (ETEC) NOT DETECTED NOT DETECTED Final   Shiga like toxin producing E coli (STEC) NOT DETECTED NOT DETECTED Final   Shigella/Enteroinvasive E coli (EIEC) NOT DETECTED NOT DETECTED Final   Cryptosporidium NOT DETECTED NOT DETECTED Final   Cyclospora cayetanensis NOT DETECTED NOT DETECTED Final   Entamoeba histolytica NOT DETECTED NOT DETECTED Final   Giardia lamblia NOT DETECTED NOT DETECTED Final   Adenovirus F40/41 NOT DETECTED NOT DETECTED Final   Astrovirus NOT DETECTED NOT DETECTED Final   Norovirus GI/GII NOT DETECTED NOT DETECTED Final   Rotavirus A NOT DETECTED NOT DETECTED Final   Sapovirus (I, II, IV, and V) NOT DETECTED NOT DETECTED Final    Comment: Performed at [facility], [address] [place], [place] [address]  Blood Culture (routine x 2)     Status: None   Collection Time: 04/23/23  6:31 AM   Specimen: BLOOD  Result Value Ref Range Status   Specimen Description BLOOD BLOOD RIGHT HAND  Final   Special Requests   Final    BOTTLES DRAWN AEROBIC AND ANAEROBIC Blood Culture results may not be optimal due to an inadequate volume of blood received in culture bottles   Culture   Final    NO GROWTH 5 DAYS Performed at [facility], [address] [place], [place] [address]    Report Status 04/28/2023 FINAL  Final  Aerobic Culture w Gram Stain (superficial specimen)     Status: None    Collection Time: 04/23/23 10:42 AM   Specimen: Groin  Result Value Ref Range Status   Specimen Description   Final    GROIN Performed at [facility], [address], [place], [place] [address]    Special Requests   Final    NONE Performed at [facility], [address] [place], [place] [address]    Gram Stain   Final    RARE WBC PRESENT, PREDOMINANTLY PMN RARE GRAM NEGATIVE RODS RARE GRAM NEGATIVE COCCOBACILLI Performed at [facility], [address], [place], [place] [address]    Culture   Final    MODERATE  MORGANELLA MORGANII FEW ESCHERICHIA COLI FEW PROTEUS MIRABILIS    Report Status 04/26/2023 FINAL  Final   Organism ID, Bacteria ESCHERICHIA COLI  Final   Organism ID, Bacteria MORGANELLA MORGANII  Final   Organism ID, Bacteria PROTEUS MIRABILIS  Final      Susceptibility   Escherichia coli - MIC*    AMPICILLIN >=32 RESISTANT Resistant     CEFEPIME <=0.12 SENSITIVE Sensitive     CEFTAZIDIME <=1 SENSITIVE Sensitive     CEFTRIAXONE <=0.25 SENSITIVE Sensitive     CIPROFLOXACIN <=0.25 SENSITIVE Sensitive     GENTAMICIN <=1 SENSITIVE Sensitive     IMIPENEM <=0.25 SENSITIVE Sensitive     TRIMETH/SULFA <=20 SENSITIVE Sensitive     AMPICILLIN/SULBACTAM 16 INTERMEDIATE Intermediate     PIP/TAZO <=4 SENSITIVE Sensitive ug/mL    * FEW ESCHERICHIA COLI   Morganella morganii - MIC*    AMPICILLIN >=32 RESISTANT Resistant     CEFTAZIDIME <=1 SENSITIVE Sensitive     CIPROFLOXACIN <=0.25 SENSITIVE Sensitive     GENTAMICIN <=1 SENSITIVE Sensitive     IMIPENEM 4 SENSITIVE Sensitive     TRIMETH/SULFA <=20 SENSITIVE Sensitive     AMPICILLIN/SULBACTAM 16 INTERMEDIATE Intermediate     PIP/TAZO <=4 SENSITIVE Sensitive ug/mL    * MODERATE MORGANELLA MORGANII   Proteus mirabilis - MIC*    AMPICILLIN <=2 SENSITIVE Sensitive     CEFEPIME <=0.12 SENSITIVE Sensitive     CEFTAZIDIME <=1 SENSITIVE Sensitive     CEFTRIAXONE <=0.25 SENSITIVE Sensitive      CIPROFLOXACIN <=0.25 SENSITIVE Sensitive     GENTAMICIN <=1 SENSITIVE Sensitive     IMIPENEM 2 SENSITIVE Sensitive     TRIMETH/SULFA <=20 SENSITIVE Sensitive     AMPICILLIN/SULBACTAM <=2 SENSITIVE Sensitive     PIP/TAZO <=4 SENSITIVE Sensitive ug/mL    * FEW PROTEUS MIRABILIS  Aerobic/Anaerobic Culture w Gram Stain (surgical/deep wound)     Status: None   Collection Time: 04/25/23  2:41 PM   Specimen: Wound; Body Fluid  Result Value Ref Range Status   Specimen Description   Final    WOUND Performed at [facility], [address] [place], [place] [address]    Special Requests RETROPERITONEAL HEMATOMA SPEC A  Final   Gram Stain   Final    FEW WBC PRESENT,BOTH PMN AND MONONUCLEAR NO ORGANISMS SEEN    Culture   Final    RARE ENTEROCOCCUS FAECALIS NO ANAEROBES ISOLATED Performed at [facility], [address], [place], [place] [address]    Report Status 05/02/2023 FINAL  Final   Organism ID, Bacteria ENTEROCOCCUS FAECALIS  Final      Susceptibility   Enterococcus faecalis - MIC*    AMPICILLIN <=2 SENSITIVE Sensitive     VANCOMYCIN 2 SENSITIVE Sensitive     GENTAMICIN SYNERGY SENSITIVE Sensitive     * RARE ENTEROCOCCUS FAECALIS    Coagulation Studies: No results for input(s): "LABPROT", "INR" in the last 72 hours.  Urinalysis: No results for input(s): "COLORURINE", "LABSPEC", "PHURINE", "GLUCOSEU", "HGBUR", "BILIRUBINUR", "KETONESUR", "PROTEINUR", "UROBILINOGEN", "NITRITE", "LEUKOCYTESUR" in the last 72 hours.  Invalid input(s): "APPERANCEUR"    Imaging: DG Chest Port 1 View Result Date: 05/03/2023 CLINICAL DATA:  Interstitial edema, hypotension EXAM: PORTABLE CHEST 1 VIEW COMPARISON:  05/01/2023 FINDINGS: Patchy left lower lobe opacity, favoring atelectasis. Small bilateral pleural effusions, left greater than right. This appearance is unchanged from recent prior. No frank interstitial edema.  No pneumothorax. Heart is normal in size Right arm PICC  terminates in  the lower SVC. IMPRESSION: Patchy left lower lobe opacity, favoring atelectasis. Small bilateral pleural effusions, left greater than right. No frank interstitial edema. Electronically Signed   By: Charline Bills M.D.   On: 05/03/2023 20:15     Medications:    piperacillin-tazobactam (ZOSYN)  IV 2.25 g (05/05/23 0512)    alum & mag hydroxide-simeth  15 mL Oral Once   apixaban  2.5 mg Oral BID   aspirin EC  81 mg Oral Daily   atorvastatin  40 mg Oral Daily   Chlorhexidine Gluconate Cloth  6 each Topical Daily   clopidogrel  75 mg Oral Daily   ezetimibe  10 mg Oral Daily   fentaNYL  1 patch Transdermal Q72H    HYDROmorphone (DILAUDID) injection  0.5 mg Intravenous Once   pregabalin  75 mg Oral Daily   sodium chloride flush  10-40 mL Intracatheter Q12H   traZODone  50 mg Oral QHS   acetaminophen, albuterol, oxyCODONE, phenylephrine, sodium chloride flush  Assessment/ Plan:  Ms. Kalee Prior is a 77 y.o.  female with medical problems of hypertension, heart failure with preserved ejection fraction, history of pleural effusions, history of diffuse vascular disease including coronary artery disease-history of PCI in LAD stent October 2023, chronic kidney disease, prediabetes, atrial fibrillation status post cardioversion in 2022 followed by Dr. Alyson Locket at [facility] nephrology. Patient presents to the ED with hypotension and suspected wound infection. She has been admitted for Cellulitis of right groin [L03.314] Cellulitis of right thigh [L03.115]   Acute Kidney Injury on chronic kidney disease stage IIIb with baseline creatinine 1.3 and GFR of 42 on 03/22/23.  Acute kidney injury appears multifactorial, hypotension, IV contrast exposure, and diuretics. Creatinine on admission, 1.17.  Patient experienced hypotension prior to ED arrival.  IV contrast exposure on 04/22/2023.  Received approximately 2 days of diuresis.    Creatinine seems to have plateaued. Discussed the possibility of  temporary dialysis if renal function continues to worsen.  Patient and family will discuss this.  No acute indication for dialysis today.   - holding IV fluids, encourage PO intake  - more accurate urine output recording.   Lab Results  Component Value Date   CREATININE 4.67 (H) 05/05/2023   CREATININE 4.56 (H) 05/04/2023   CREATININE 3.95 (H) 05/03/2023    Intake/Output Summary (Last 24 hours) at 05/05/2023 1100 Last data filed at 05/05/2023 0435 Gross per 24 hour  Intake 100 ml  Output 800 ml  Net -700 ml   2. Anemia of chronic kidney disease Lab Results  Component Value Date   HGB 9.5 (L) 05/04/2023    Hemoglobin stable. No indication for ESA   3.  Hypertension: home regimen of carvedilol, isosorbide mononitrate and valsartan  - continue to hold blood pressure medications.    LOS: 13 Loyce Flaming 12/14/202411:00 AM

## 2023-05-05 NOTE — Progress Notes (Signed)
Progress Note   Patient: Dawn Bruce XLK:[id number] DOB: [birth year] DOA: 04/22/2023     13 DOS: the patient was seen and examined on 05/05/2023   Brief hospital course: Dawn Bruce is a 77 y.o. female with medical history significant of dCHF, CAD (s/p of recent DES), A fib on Eliquis, HTN, HLD, CDK-3a, chronic pain syndrome, multiple skin cancer, perforated appendicitis, former smoker, who presents with right groin pain.  Patient was recently hospitalized from 11/21 - 11/29 due to non-STEMI. Patient is s/p of LHC with DES placement. Pt developed retroperitoneal hematoma & hemorrhagic shock due to complication of Iliac artery laceration during left heart cath of right groin. Pt underwent surgical repair of external iliac artery by VVS on 11/23.  Presented back to ED 12/1 due to increased redness, swelling, and pain at incision site of the cath. Failed outpatient Abx x1 dose.  Hypotensive to 80/50 on presentation. 12/2: pulmonology, and IR consulted to address pleural effusion. ID consulted to address abx course. Transitioned to linezolid. Vascular evaluated and recommended debridement. 12/3: Thoracentesis removed 1.6L left side 12/4:stable ORA, afebrile. Wound clinically greatly improved. Debridement today. Plan to stop heparin gtt and return to eliquis + DAPT s/p procedure 12/6: additional debridement.  Debridement again 12/10. Now on zosyn. Doing well. Respiratory status stable ORA.  12/12.  Patient had a worsening renal function, nephrology consult obtained.   Principal Problem:   Cellulitis of right groin Active Problems:   Sepsis (HCC)   CAD (coronary artery disease)   Peritoneal hematoma   Essential hypertension   Chronic diastolic heart failure (HCC)   Hypokalemia   Chronic kidney disease, stage 3a (HCC)   Pleural effusion   Abnormal LFTs   Atrial fibrillation, chronic (HCC)   Diarrhea   Chronic pain syndrome   HLD (hyperlipidemia)   HFrEF (heart failure with reduced ejection  fraction) (HCC)   Severe sepsis (HCC)   Infected wound   Cellulitis of right thigh   Acute kidney injury superimposed on stage 3a chronic kidney disease (HCC)   Metabolic acidosis   Overweight (BMI 25.0-29.9)   Assessment and Plan:  Acute renal failure on chronic kidney disease stage IIIa. Hypokalemia. Mild metabolic acidosis. Transient hypotension. Patient has much worsening renal function with creatinine increased to 2.78 from 1.08 on 12/5.  This could be secondary to diuretics.  Patient also had an episode of hypotension yesterday, received a 500 mL normal saline bolus.  Blood pressure medicine was discontinued. Worsening renal function with creatinine went up to 3.9 11/12.  Reviewed prior renal ultrasound performed 11/24, no hydronephrosis.  Consulted nephrology.   Renal function finally stabilizing/improving.  Continue to monitor.     Sepsis due to cellulitis of right groin Right groin wound infection secondary to surgical repair of pseudoaneurysm. Sepsis criteria met on admission with WBC 21.3, heart rate 104, RR 29.  Lactic acid normal 1.7.   Sepsis appears to be secondary to right groin surgical wound infection.  Patient had debridement x 2, wound-vac in place Wound culture grow E. coli, Morganella morganii, Proteus Morales, Enterococcus faecalis.  Blood culture negative.  Initially treated with linezolid + cefepime, switched to Zosyn on 12/11.  Dressing change performed by vascular surgery 12/12, wound is getting better with significant granulation. Discussed with vascular surgery and ID, will continue IV antibiotics while in the hospital.  May switch to oral antibiotics at discharge. Patient is scheduled to have dressing changes again on Monday.   Hypervolemia  Bilateral Pleural effusions Acute on chronic systolic congestive  heart failure.  Hx of CAD: s/p of recent DES. Patient initially received IV Lasix, also had a thoracentesis. Volume status has improved. Also  restarted the Plavix.   Peritoneal hematoma Anemia acute on chronic. Thrombocytopenia. Hemoglobin dropped down to 7.6 12/11, received another unit of PRBC transfusion.  Normal iron, B12 level borderline, check homocystine level pending.  Hemoglobin much better. Patient developed mild thrombocytopenia of 117 on 12/12, not currently on heparin.   Recheck a CBC tomorrow.   Essential hypertension: Hold off all blood pressure medicines including beta-blocker due to hypotension.   Atrial fibrillation, chronic (HCC): rate controlled -Switch back to eliquis Hold off Coreg.   Diarrhea: C. difficile negative. -As needed Imodium   Chronic pain syndrome -Continue home fentanyl patch and Lyrica   HLD: -lipitor and zetia        Subjective:  Patient doing well today.  No complaint.  Physical Exam: Vitals:   05/04/23 1940 05/04/23 2003 05/05/23 0405 05/05/23 0448  BP:  132/65 (!) 141/74   Pulse:  94 92   Resp:  16 16   Temp:  97.7 F (36.5 C) 97.6 F (36.4 C)   TempSrc:  Oral Oral   SpO2: 94% 98% 100%   Weight:    75.8 kg  Height:       General exam: Appears calm and comfortable  Respiratory system: Clear to auscultation. Respiratory effort normal. Cardiovascular system: S1 & S2 heard, RRR. No JVD, murmurs, rubs, gallops or clicks. No pedal edema. Gastrointestinal system: Abdomen is nondistended, soft and nontender. No organomegaly or masses felt. Normal bowel sounds heard. Central nervous system: Alert and oriented. No focal neurological deficits. Extremities: Symmetric 5 x 5 power. Skin: No rashes, lesions or ulcers Psychiatry: Judgement and insight appear normal. Mood & affect appropriate.    Data Reviewed:  Lab results reviewed.  Family Communication: Husband updated at bedside.  Disposition: Status is: Inpatient Remains inpatient appropriate because: Severity of disease,     Time spent: 35 minutes  Author: Marrion Coy, MD 05/05/2023 10:23 AM  For on  call review [email or web address].

## 2023-05-05 NOTE — Plan of Care (Signed)

## 2023-05-06 ENCOUNTER — Inpatient Hospital Stay: Payer: Medicare Other

## 2023-05-06 DIAGNOSIS — N1831 Chronic kidney disease, stage 3a: Secondary | ICD-10-CM | POA: Diagnosis not present

## 2023-05-06 DIAGNOSIS — D696 Thrombocytopenia, unspecified: Secondary | ICD-10-CM | POA: Insufficient documentation

## 2023-05-06 DIAGNOSIS — L03314 Cellulitis of groin: Secondary | ICD-10-CM | POA: Diagnosis not present

## 2023-05-06 DIAGNOSIS — N179 Acute kidney failure, unspecified: Secondary | ICD-10-CM | POA: Diagnosis not present

## 2023-05-06 DIAGNOSIS — I482 Chronic atrial fibrillation, unspecified: Secondary | ICD-10-CM | POA: Diagnosis not present

## 2023-05-06 LAB — CBC WITH DIFFERENTIAL/PLATELET
Abs Immature Granulocytes: 0.04 10*3/uL (ref 0.00–0.07)
Basophils Absolute: 0 10*3/uL (ref 0.0–0.1)
Basophils Relative: 1 %
Eosinophils Absolute: 0.3 10*3/uL (ref 0.0–0.5)
Eosinophils Relative: 5 %
HCT: 29.8 % — ABNORMAL LOW (ref 36.0–46.0)
Hemoglobin: 9.8 g/dL — ABNORMAL LOW (ref 12.0–15.0)
Immature Granulocytes: 1 %
Lymphocytes Relative: 7 %
Lymphs Abs: 0.5 10*3/uL — ABNORMAL LOW (ref 0.7–4.0)
MCH: 28.9 pg (ref 26.0–34.0)
MCHC: 32.9 g/dL (ref 30.0–36.0)
MCV: 87.9 fL (ref 80.0–100.0)
Monocytes Absolute: 0.9 10*3/uL (ref 0.1–1.0)
Monocytes Relative: 12 %
Neutro Abs: 5.4 10*3/uL (ref 1.7–7.7)
Neutrophils Relative %: 74 %
Platelets: 50 10*3/uL — ABNORMAL LOW (ref 150–400)
RBC: 3.39 MIL/uL — ABNORMAL LOW (ref 3.87–5.11)
RDW: 15.9 % — ABNORMAL HIGH (ref 11.5–15.5)
WBC: 7.2 10*3/uL (ref 4.0–10.5)
nRBC: 0 % (ref 0.0–0.2)

## 2023-05-06 LAB — BASIC METABOLIC PANEL
Anion gap: 10 (ref 5–15)
BUN: 92 mg/dL — ABNORMAL HIGH (ref 8–23)
CO2: 22 mmol/L (ref 22–32)
Calcium: 8.3 mg/dL — ABNORMAL LOW (ref 8.9–10.3)
Chloride: 105 mmol/L (ref 98–111)
Creatinine, Ser: 4.67 mg/dL — ABNORMAL HIGH (ref 0.44–1.00)
GFR, Estimated: 9 mL/min — ABNORMAL LOW (ref >60)
Glucose, Bld: 105 mg/dL — ABNORMAL HIGH (ref 70–99)
Potassium: 4.6 mmol/L (ref 3.5–5.1)
Sodium: 137 mmol/L (ref 135–145)

## 2023-05-06 LAB — CBC
HCT: 28.9 % — ABNORMAL LOW (ref 36.0–46.0)
Hemoglobin: 9.5 g/dL — ABNORMAL LOW (ref 12.0–15.0)
MCH: 28.4 pg (ref 26.0–34.0)
MCHC: 32.9 g/dL (ref 30.0–36.0)
MCV: 86.5 fL (ref 80.0–100.0)
Platelets: 51 10*3/uL — ABNORMAL LOW (ref 150–400)
RBC: 3.34 MIL/uL — ABNORMAL LOW (ref 3.87–5.11)
RDW: 15.8 % — ABNORMAL HIGH (ref 11.5–15.5)
WBC: 6.3 10*3/uL (ref 4.0–10.5)
nRBC: 0 % (ref 0.0–0.2)

## 2023-05-06 LAB — RETIC PANEL
Immature Retic Fract: 2.3 % (ref 2.3–15.9)
RBC.: 3.37 MIL/uL — ABNORMAL LOW (ref 3.87–5.11)
Retic Count, Absolute: 15.5 10*3/uL — ABNORMAL LOW (ref 19.0–186.0)
Retic Ct Pct: 0.5 % (ref 0.4–3.1)
Reticulocyte Hemoglobin: 32.1 pg (ref >27.9)

## 2023-05-06 LAB — URIC ACID: Uric Acid, Serum: 6.2 mg/dL (ref 2.5–7.1)

## 2023-05-06 LAB — IMMATURE PLATELET FRACTION: Immature Platelet Fraction: 9.2 % — ABNORMAL HIGH (ref 1.2–8.6)

## 2023-05-06 LAB — LACTATE DEHYDROGENASE: LDH: 231 U/L — ABNORMAL HIGH (ref 98–192)

## 2023-05-06 MED ORDER — WITCH HAZEL-GLYCERIN EX PADS
MEDICATED_PAD | CUTANEOUS | Status: DC | PRN
  Filled 2023-05-06: qty 100

## 2023-05-06 MED ORDER — LACTATED RINGERS IV SOLN: INTRAVENOUS | Status: DC

## 2023-05-06 MED ORDER — AMOXICILLIN-POT CLAVULANATE 500-125 MG PO TABS
1.0000 | ORAL_TABLET | Freq: Two times a day (BID) | ORAL | Status: DC
Start: 2023-05-07 — End: 2023-05-06

## 2023-05-06 MED ORDER — AMOXICILLIN-POT CLAVULANATE 500-125 MG PO TABS
1.0000 | ORAL_TABLET | Freq: Every day | ORAL | Status: DC
Start: 2023-05-07 — End: 2023-05-07
  Administered 2023-05-07: 1 via ORAL
  Filled 2023-05-06: qty 1

## 2023-05-06 MED ORDER — CIPROFLOXACIN HCL 500 MG PO TABS
500.0000 mg | ORAL_TABLET | Freq: Every day | ORAL | Status: DC
  Administered 2023-05-07 – 2023-05-13 (×7): 500 mg via ORAL
  Filled 2023-05-06 (×7): qty 1

## 2023-05-06 MED ORDER — LOPERAMIDE HCL 2 MG PO CAPS
2.0000 mg | ORAL_CAPSULE | Freq: Two times a day (BID) | ORAL | Status: AC
  Administered 2023-05-06 (×2): 2 mg via ORAL
  Filled 2023-05-06 (×2): qty 1

## 2023-05-06 NOTE — Progress Notes (Signed)
Progress Note   Patient: Dawn Bruce BMW:[id number] DOB: 11-01-1945 DOA: 04/22/2023     14 DOS: the patient was seen and examined on 05/06/2023   Brief hospital course: Eisa Albertini is a 77 y.o. female with medical history significant of dCHF, CAD (s/p of recent DES), A fib on Eliquis, HTN, HLD, CDK-3a, chronic pain syndrome, multiple skin cancer, perforated appendicitis, former smoker, who presents with right groin pain.  Patient was recently hospitalized from 11/21 - 11/29 due to non-STEMI. Patient is s/p of LHC with DES placement. Pt developed retroperitoneal hematoma & hemorrhagic shock due to complication of Iliac artery laceration during left heart cath of right groin. Pt underwent surgical repair of external iliac artery by V[facility] on 11/23.  Presented back to ED 12/1 due to increased redness, swelling, and pain at incision site of the cath. Failed outpatient Abx x1 dose.  Hypotensive to 80/50 on presentation. 12/2: pulmonology, and IR consulted to address pleural effusion. ID consulted to address abx course. Transitioned to linezolid. Vascular evaluated and recommended debridement. 12/3: Thoracentesis removed 1.6L left side 12/4:stable ORA, afebrile. Wound clinically greatly improved. Debridement today. Plan to stop heparin gtt and return to eliquis + DAPT s/p procedure 12/6: additional debridement.  Debridement again 12/10. Now on zosyn. Doing well. Respiratory status stable ORA.  12/12.  Patient had a worsening renal function, nephrology consult obtained. 12/14.  Worsening thrombocytopenia, consult hematology oncology to rule out TTS.   Principal Problem:   Cellulitis of right groin Active Problems:   Sepsis (HCC)   CAD (coronary artery disease)   Peritoneal hematoma   Essential hypertension   Chronic diastolic heart failure (HCC)   Hypokalemia   Chronic kidney disease, stage 3a (HCC)   Pleural effusion   Abnormal LFTs   Atrial fibrillation, chronic (HCC)   Diarrhea   Chronic  pain syndrome   HLD (hyperlipidemia)   HFrEF (heart failure with reduced ejection fraction) (HCC)   Severe sepsis (HCC)   Infected wound   Cellulitis of right thigh   Acute kidney injury superimposed on stage 3a chronic kidney disease (HCC)   Metabolic acidosis   Overweight (BMI 25.0-29.9)   Assessment and Plan:  Acute renal failure on chronic kidney disease stage IIIa. Hypokalemia. Mild metabolic acidosis. Transient hypotension. Patient has much worsening renal function with creatinine increased to 2.78 from 1.08 on 12/5.  This could be secondary to diuretics.  Patient also had an episode of hypotension yesterday, received a 500 mL normal saline bolus.  Blood pressure medicine was discontinued. Worsening renal function with creatinine went up to 3.9 11/12.  Reviewed prior renal ultrasound performed 11/24, no hydronephrosis.  Consulted nephrology.   Renal function finally stabilizing. Patient has some loose stools, with in adequate p.o. intake.  Will give 500 mL of likely Ringer's solution.  Also started as needed Imodium.     Sepsis due to cellulitis of right groin Right groin wound infection secondary to surgical repair of pseudoaneurysm. Sepsis criteria met on admission with WBC 21.3, heart rate 104, RR 29.  Lactic acid normal 1.7.   Sepsis appears to be secondary to right groin surgical wound infection.  Patient had debridement x 2, wound-vac in place Wound culture grow E. coli, Morganella morganii, Proteus Morales, Enterococcus faecalis.  Blood culture negative.  Initially treated with linezolid + cefepime, switched to Zosyn on 12/11.  Dressing change performed by vascular surgery 12/12, wound is getting better with significant granulation. Discussed with vascular surgery and ID, will continue IV antibiotics while in  the hospital.  May switch to oral antibiotics at discharge. Patient is scheduled to have dressing changes again on Monday.   Hypervolemia  Bilateral Pleural  effusions Acute on chronic systolic congestive heart failure.  Hx of CAD: s/p of recent DES. Patient initially received IV Lasix, also had a thoracentesis. Volume status has improved. Also restarted the Plavix.  Consult cardiology to reconsider if plavix can be on hold due to severe thrombocytopenia.   Peritoneal hematoma Anemia acute on chronic. Thrombocytopenia. Hemoglobin dropped down to 7.6 12/11, received another unit of PRBC transfusion.  Normal iron, B12 level borderline, check homocystine level normal.  Hemoglobin much better. Patient developed mild thrombocytopenia of 117 on 12/12, not currently on heparin.   Platelets further dropped down to 51 today.  Given recently worsening anemia and worsening renal function, need to rule out thrombolytic thrombocytopenia.  Discussed with hematology, ordered haptoglobin, LDH, peripheral smear, ADAMTS13 and reticulocyte count.  Patient will be seen by hematology today.  Continue to follow.   Essential hypertension: Hold off all blood pressure medicines including beta-blocker due to hypotension.   Atrial fibrillation, chronic (HCC): rate controlled -Switch back to eliquis Hold off Coreg.   Diarrhea: C. difficile negative. Patient has multiple loose stools, I have scheduled 2 doses of AC Imodium.   Chronic pain syndrome -Continue home fentanyl patch and Lyrica   HLD: -lipitor and zetia        Subjective:  Patient complaining of multiple loose/very soft stools.  But no abdominal pain or nausea vomiting.  Also total oral intake is around a liter of fluids over the last 24 hours.  Physical Exam: Vitals:   05/05/23 2026 05/06/23 0401 05/06/23 0500 05/06/23 0852  BP: (!) 147/72 (!) 147/64  (!) 146/52  Pulse: 96 99  (!) 103  Resp: 20 16  18   Temp: 97.9 F (36.6 C) 97.7 F (36.5 C)  97.9 F (36.6 C)  TempSrc: Oral Oral    SpO2: 99% 97%  100%  Weight:   75.8 kg   Height:       General exam: Appears calm and comfortable   Respiratory system: Clear to auscultation. Respiratory effort normal. Cardiovascular system: S1 & S2 heard, RRR. No JVD, murmurs, rubs, gallops or clicks. No pedal edema. Gastrointestinal system: Abdomen is nondistended, soft and nontender. No organomegaly or masses felt. Normal bowel sounds heard. Central nervous system: Alert and oriented x2. No focal neurological deficits. Extremities: Symmetric 5 x 5 power. Skin: No rashes, lesions or ulcers Psychiatry:  Mood & affect appropriate.    Data Reviewed:  Lab results reviewed.  Family Communication: Husband updated at the bedside.  Disposition: Status is: Inpatient Remains inpatient appropriate because: Severity of disease,     Time spent: 50 minutes  Author: Marrion Coy, MD 05/06/2023 12:29 PM  For on call review [email or web address].

## 2023-05-06 NOTE — Progress Notes (Signed)
PULMONOLOGY         Date: 05/06/2023,   MRN# [id number] Dawn Bruce 23-Jul-1945     AdmissionWeight: 54.4 kg                 CurrentWeight: 75.8 kg  Referring provider: Dr Gilda Crease   CHIEF COMPLAINT:   Complete atelectasis of left lung with bilateral pleural effusions.    HISTORY OF PRESENT ILLNESS   77 year old female with a history of chronic GERD, basal cell carcinoma squamous cell carcinoma history of NSTEMI diastolic CHF, CAD status post recent PCI with DES, chronic A-fib on Eliquis, essential hypertension, dyslipidemia CKD 3A chronic pain syndrome remote perforated appendicitis, hospitalized November 2024 with non-STEMI.  Post left heart cath had developed retroperitoneal hematoma and hemorrhagic shock with complications of iliac artery laceration.  Status post surgical repair via vascular surgery service.  Had outpatient therapy with antibiotics with failure came in with transient hypotension and circulatory shock.  Found to have elevated cardiac biomarkers and acute on chronic CHF.  Vascular surgery was reconsulted today and PCCM consultation was placed to incidental finding of complete atelectasis of the left lung with surrounding bilateral pleural effusions worse on the left.  04/25/23- patient evaluated in PACU post op , appears to be mildy sedated still. Mildy hypotensive at this moment.  Lung sounds much improved post 1.6L left pleural space aspiration.  04/26/23- patient seen and examined , Dawn Bruce (daughter) at bedside.  Patient receiving blood transfusion.  Lung sounds are clear , vitals are stable.  Hypoalbulminemia continues. Will order urine protein and pre albumin levels,  because patient shares she's eating well.  CXR in am to check interval effusion.  04/27/23- patient seen at bedside, husband and daughter present.  For surgery wound washout and vac exchange today.   Vitals stable , room air spO2 99%     04/30/23- patient seen at bedside.  Has  additional surgery planned with debridement.  CXR with recurring effusion and associated compressive atelelctasis.   Ideally would like to increase diuresis and recruitment maunevers.  E. Faecallis noted on wound culture.  I discussed case with Dr Avon Gully infectious disease specialist today.  Dr Dareen Piano is also considering pleurX catheter which patient may need if this does not resolve with medical management. She already had multiple thoracentesis. No noew labs are available for review but are in process. Will order purewick for urination due to physical weakness.      05/01/23-  patient reports improvement post diuresis.  Will obtain interval CXR today.  We discussed PleurX permanent tunneled catheter and she declined to have this done at the moment.  She is on room air with normoxia.  She developed mild contraction and we will take diuretic holiday.   05/02/23- patient with severe hypotension.  I have dcd her beta blocker today and imdur. She is now getting IV fluids and is able to speak.  Will start midodrine 5mg  po tid.  05/03/23- patient is now no longer hypotensive.  She had AKI and this is very likely related to circulatory shock over past 24h.  She had fluid via IV and I'm concerned regarding reacculumation of pleural fluid so I have dcd this now since she is hypertensive.  There is nephrology consult in progress.  05/04/23- patient is improved clinically, she has improved vital signs,  she does have AKI but she is making light yellow urine.  She has nephrology evaluation.  There is consideration for HD.  05/06/23- patient clinically  reporting mildy worsening SOB.  Repeat CXR reviewed by me with improved right hemithorax but worse on left with reaccumulated effusion moderate.  She has progressive thrombocytopenia with today platelets <51, does not appear toxic at all and is lucid alert and appropriate.  She has PCI x2 in the past.  Patient is uremic and am concerned regarding potential for  bleeding but same time concern for stent thrombosis/clotting with AF/CAD.  Have discussed with team and will ask for additional evaluation with cardiology.  Dr Chipper Herb has also notified oncology to help with blood abnormalities.     PAST MEDICAL HISTORY   Past Medical History:  Diagnosis Date   Actinic keratosis    Chronic pain    GERD (gastroesophageal reflux disease) ?   Taking Pepcid   History of basal cell carcinoma (BCC) 05/02/2021   right upper back paraspinal   History of SCC (squamous cell carcinoma) of skin 09/10/2019   left dorsum wrist ED&C done 10/28/19   History of SCC (squamous cell carcinoma) of skin 03/04/2020   right dorsum hand   History of squamous cell carcinoma in situ (SCCIS) 03/04/2020   left dorsum wrist  ED&C   History of squamous cell carcinoma in situ (SCCIS) 03/04/2020   right medial infraorbital  ED&C 04/28/2020   History of squamous cell carcinoma in situ (SCCIS) 10/28/2019   right dorsum hand proximal lateral   History of squamous cell carcinoma in situ (SCCIS) 10/28/2019   right dorsum hand proximal medial   Hyperlipidemia    Hypertension    NSTEMI (non-ST elevated myocardial infarction) (HCC) 04/12/2023   Renal disorder    Squamous cell carcinoma in situ 10/31/2021   left distal  tricep, EDC   Squamous cell carcinoma of skin 08/28/2022   Right volar forearm - [facility]     SURGICAL HISTORY   Past Surgical History:  Procedure Laterality Date   ABDOMINAL HYSTERECTOMY     APPENDECTOMY  April 2023   Burst appendix   APPLICATION OF WOUND VAC Right 04/25/2023   Procedure: APPLICATION OF WOUND VAC;  Surgeon: Renford Dills, MD;  Location: [facility] OR[facility];  Service: Vascular;  Laterality: Right;   APPLICATION OF WOUND VAC Right 04/27/2023   Procedure: WOUND VAC EXCHANGE;  Surgeon: Renford Dills, MD;  Location: [facility];  Service: Vascular;  Laterality: Right;   APPLICATION OF WOUND VAC Right 05/01/2023   Procedure: APPLICATION OF WOUND VAC;  Surgeon:  Renford Dills, MD;  Location: [facility];  Service: Vascular;  Laterality: Right;   AUGMENTATION MAMMAPLASTY Bilateral    25-30 years ago   CARDIOVERSION N/A 12/01/2020   Procedure: CARDIOVERSION;  Surgeon: Debbe Odea, MD;  Location: [facility];  Service: Cardiovascular;  Laterality: N/A;   CAROTID ARTERY ANGIOPLASTY Right 1995(approximate)   CAROTID ENDARTERECTOMY  2001   CORONARY STENT INTERVENTION N/A 04/13/2023   Procedure: CORONARY STENT INTERVENTION;  Surgeon: Marcina Millard, MD;  Location: [facility];  Service: Cardiovascular;  Laterality: N/A;   COSMETIC SURGERY     ELBOW SURGERY Left    ENDARTERECTOMY FEMORAL Right 04/13/2023   Procedure: Right groin exploration with repair of right external iliac artery and right circumflex artery;  Surgeon: Bertram Denver, MD;  Location: [facility];  Service: Vascular;  Laterality: Right;   FRACTURE SURGERY  2016   Arm   INCISION AND DRAINAGE OF WOUND Right 04/25/2023   Procedure: IRRIGATION AND DEBRIDEMENT WOUND;  Surgeon: Renford Dills, MD;  Location: [facility];  Service: Vascular;  Laterality: Right;   INCISION AND DRAINAGE OF WOUND Right 04/27/2023   Procedure: IRRIGATION AND DEBRIDEMENT WOUND;  Surgeon: Renford Dills, MD;  Location: [facility];  Service: Vascular;  Laterality: Right;   INCISION AND DRAINAGE OF WOUND Right 05/01/2023   Procedure: IRRIGATION AND DEBRIDEMENT WOUND;  Surgeon: Renford Dills, MD;  Location: [facility];  Service: Vascular;  Laterality: Right;   LEFT HEART CATH AND CORONARY ANGIOGRAPHY N/A 04/13/2023   Procedure: LEFT HEART CATH AND CORONARY ANGIOGRAPHY;  Surgeon: Marcina Millard, MD;  Location: [facility];  Service: Cardiovascular;  Laterality: N/A;   THORACENTESIS N/A 12/27/2020   Procedure: Alanson Puls;  Surgeon: Josephine Igo, DO;  Location: [facility];  Service: Pulmonary;  Laterality: N/A;   TONSILLECTOMY     TUBAL LIGATION  ?     FAMILY HISTORY    Family History  Problem Relation Age of Onset   Cancer Mother    Cancer Father    Cancer Sister    Breast cancer Neg Hx      SOCIAL HISTORY   Social History   Tobacco Use   Smoking status: Former    Current packs/day: 0.00    Average packs/day: 0.3 packs/day for 15.0 years (3.8 ttl pk-yrs)    Types: Cigarettes    Start date: 3    Quit date: 59    Years since quitting: 34.9    Passive exposure: Past   Smokeless tobacco: Never   Tobacco comments:    quit around 1990-1995 (?)  Vaping Use   Vaping status: Never Used  Substance Use Topics   Alcohol use: Yes    Alcohol/week: 14.0 standard drinks of alcohol    Types: 14 Standard drinks or equivalent per week    Comment: 2 scotch shots   Drug use: Never     MEDICATIONS    Home Medication:     Current Medication:  Current Facility-Administered Medications:    acetaminophen (TYLENOL) tablet 650 mg, 650 mg, Oral, Q6H PRN, Schnier, Latina Craver, MD, 650 mg at 05/05/23 1809   albuterol (PROVENTIL) (2.5 MG/3ML) 0.083% nebulizer solution 2.5 mg, 2.5 mg, Nebulization, Q4H PRN, Schnier, Latina Craver, MD, 2.5 mg at 05/06/23 0731   alum & mag hydroxide-simeth (MAALOX/MYLANTA) 200-200-20 MG/5ML suspension 15 mL, 15 mL, Oral, Once, Schnier, Latina Craver, MD   apixaban Everlene Balls) tablet 2.5 mg, 2.5 mg, Oral, BID, Schnier, Latina Craver, MD, 2.5 mg at 05/06/23 1026   aspirin EC tablet 81 mg, 81 mg, Oral, Daily, Schnier, Latina Craver, MD, 81 mg at 05/06/23 1026   atorvastatin (LIPITOR) tablet 40 mg, 40 mg, Oral, Daily, Karna Christmas, Elynn Patteson, MD, 40 mg at 05/06/23 1026   Chlorhexidine Gluconate Cloth 2 % PADS 6 each, 6 each, Topical, Daily, Schnier, Latina Craver, MD, 6 each at 05/06/23 1029   clopidogrel (PLAVIX) tablet 75 mg, 75 mg, Oral, Daily, Schnier, Latina Craver, MD, 75 mg at 05/06/23 1026   ezetimibe (ZETIA) tablet 10 mg, 10 mg, Oral, Daily, Schnier, Latina Craver, MD, 10 mg at 05/06/23 1026   fentaNYL (DURAGESIC) 75 MCG/HR 1 patch, 1 patch, Transdermal,  Q72H, Schnier, Latina Craver, MD, 1 patch at 05/05/23 1341   HYDROmorphone (DILAUDID) injection 0.5 mg, 0.5 mg, Intravenous, Once, Marrion Coy, MD   loperamide (IMODIUM) capsule 2 mg, 2 mg, Oral, BID, Marrion Coy, MD, 2 mg at 05/06/23 1027   oxyCODONE (Oxy IR/ROXICODONE) immediate release tablet 5 mg, 5 mg, Oral, Q6H PRN, Schnier, Latina Craver, MD, 5 mg at 05/04/23 0008  phenylephrine ((USE for PREPARATION-H)) 0.25 % suppository 1 suppository, 1 suppository, Rectal, PRN, Schnier, Latina Craver, MD   piperacillin-tazobactam (ZOSYN) IVPB 2.25 g, 2.25 g, Intravenous, Q8H, Ravishankar, Jayashree, MD, Last Rate: 100 mL/hr at 05/06/23 0502, 2.25 g at 05/06/23 0502   pregabalin (LYRICA) capsule 75 mg, 75 mg, Oral, Daily, Marrion Coy, MD, 75 mg at 05/06/23 1027   sodium chloride flush (NS) 0.9 % injection 10-40 mL, 10-40 mL, Intracatheter, Q12H, Schnier, Latina Craver, MD, 10 mL at 05/06/23 1030   sodium chloride flush (NS) 0.9 % injection 10-40 mL, 10-40 mL, Intracatheter, PRN, Schnier, Latina Craver, MD   traZODone (DESYREL) tablet 50 mg, 50 mg, Oral, QHS, Schnier, Latina Craver, MD, 50 mg at 05/05/23 2133   witch hazel-glycerin (TUCKS) pad, , Topical, PRN, Marrion Coy, MD    ALLERGIES   Patient has no known allergies.     REVIEW OF SYSTEMS    Review of Systems:  Gen:  Denies  fever, sweats, chills weigh loss  HEENT: Denies blurred vision, double vision, ear pain, eye pain, hearing loss, nose bleeds, sore throat Cardiac:  No dizziness, chest pain or heaviness, chest tightness,edema Resp:   reports dyspnea chronically  Gi: Denies swallowing difficulty, stomach pain, nausea or vomiting, diarrhea, constipation, bowel incontinence Gu:  Denies bladder incontinence, burning urine Ext:   Denies Joint pain, stiffness or swelling Skin: Denies  skin rash, easy bruising or bleeding or hives Endoc:  Denies polyuria, polydipsia , polyphagia or weight change Psych:   Denies depression, insomnia or hallucinations    Other:  All other systems negative   VS: BP (!) 146/52 (BP Location: Right Arm)   Pulse (!) 103   Temp 97.9 F (36.6 C)   Resp 18   Ht 5' 2.99" (1.6 m)   Wt 75.8 kg   SpO2 100%   BMI 29.61 kg/m      PHYSICAL EXAM    GENERAL:NAD, no fevers, chills, no weakness no fatigue HEAD: Normocephalic, atraumatic.  EYES: Pupils equal, round, reactive to light. Extraocular muscles intact. No scleral icterus.  MOUTH: Moist mucosal membrane. Dentition intact. No abscess noted.  EAR, NOSE, THROAT: Clear without exudates. No external lesions.  NECK: Supple. No thyromegaly. No nodules. No JVD.  PULMONARY: decreased breath sounds with mild rhonchi worse at bases bilaterally.  CARDIOVASCULAR: S1 and S2. Regular rate and rhythm. No murmurs, rubs, or gallops. No edema. Pedal pulses 2+ bilaterally.  GASTROINTESTINAL: Soft, nontender, nondistended. No masses. Positive bowel sounds. No hepatosplenomegaly.  MUSCULOSKELETAL: No swelling, clubbing, or edema. Range of motion full in all extremities.  NEUROLOGIC: Cranial nerves II through XII are intact. No gross focal neurological deficits. Sensation intact. Reflexes intact.  SKIN: No ulceration, lesions, rashes, or cyanosis. Skin warm and dry. Turgor intact.  PSYCHIATRIC: Mood, affect within normal limits. The patient is awake, alert and oriented x 3. Insight, judgment intact.       IMAGING     ASSESSMENT/PLAN    Complete atelectasis of left lung   - patient s/p diuresis with partial improvement however still has minimal lung sounds on left.   - she does have CKD 3 and may not produce adequate fluid removal via medication alone   - we discussed options for thoracentesis and patient would like to proceed with this procedure.  -incentive spirometry at bedside to be used multiple times daily  - PT/OT - etiology is likely poor nutrition post ICU hospitalization with CHF and CKD all contributing to third spaced interstitial edema , peripheral  edema and effusion     Bilateral pleural effusions    - s/p throacentesis Sept 2024   - consultation for IR with US guided throacentesis - therapeutic    -patient on eliquis -prealbumin -urine protein -holding diuresis today due to [facility] and will continue to monitor renal function.  I anticipate this should improve with normal BP now. -appreciate nephrology consultation  -05/06/23- CXR with reaccumuation of left effusion   Severe AKI stage 5    Nephrology on case -appreciate input     - non oliguric at this moment , no HD today per specialist    AF with CAD s/p PCI x 2   - patient on plavix and eliquis     - s/p complicated surgery with multiple transfusions.  Now with progressive thrombocytopenia and increased potential for bleeding with uremia   Severe thrombocytopenia   - oncology on case - appreciate input - Dr B   - workup in progress  Sepsis due to right groin cellulitis   S/p vascular surgery eval S/p vanco /rocephin  - patient appears to be clinically improved.  - s/p ID revaluation - appreciate input  -E faecalis noted on wound care   Moderate proteic calorie malnutrition   - nutritional consultation    - bitemporal wasting and peripheral muscle weakness noted   - contributing to edema/effusion   - complicating tissue healing  -high protein diet  Thank you for allowing me to participate in the care of this patient.  Patient/Family are satisfied with care plan and all questions have been answered.    Provider disclosure: Patient with at least one acute or chronic illness or injury that poses a threat to life or bodily function and is being managed actively during this encounter.  All of the below services have been performed independently by signing provider:  review of prior documentation from internal and or external health records.  Review of previous and current lab results.  Interview and comprehensive assessment during patient visit today. Review of current  and previous chest radiographs/CT scans. Discussion of management and test interpretation with health care team and patient/family.   This document was prepared using Dragon voice recognition software and may include unintentional dictation errors.     Vida Rigger, M.D.  Division of Pulmonary & Critical Care Medicine

## 2023-05-06 NOTE — Consult Note (Signed)
[facility] CONSULT NOTE  Patient Care Team: Beryle Flock Marzella Schlein, MD as PCP - General (Family Medicine) Debbe Odea, MD as PCP - Cardiology (Cardiology) Deirdre Evener, MD (Dermatology) Annie Sable, MD as Consulting Physician (Nephrology) Irene Limbo., MD (Ophthalmology) Gaspar Cola, [facility] (Inactive) as Pharmacist (Pharmacist)  CHIEF COMPLAINTS/PURPOSE OF CONSULTATION: Thrombocytopenia  HISTORY OF PRESENTING ILLNESS:  Dawn Bruce 77 y.o.  female pleasant patient with history of CAD hypertension-was recently admitted to hospital for STEMI status post angiography and stent placement approximately 3 weeks ago.  Hospitalization was complicated by retroperitoneal hematoma/hemorrhagic shock needing a repair of the right external iliac.    Unfortunately was readmitted to hospital on December 1 as above procedures was complicated by right groin infection needing serial debridements and also wound VAC   Patient has been recently started on antibiotics cefepime plus linezolid [between December 2 and December 9].  However more recently December 9 started on Zosyn. changes.  Of note patient also noted to be in renal failure-acute on chronic-likely attributed to recent contrast exposure/infection etc.   Of note patient noted to have worsening thrombocytopenia-since 12/12-dropping from a normal baseline of 20-300 to 117.  And today is 50.  Of note patient is also on antiplatelet therapy given recent stent placement.   Patient complains of shortness of breath this evening.  Otherwise denies any chest pain.  Review of Systems  Constitutional:  Positive for malaise/fatigue and weight loss. Negative for chills, diaphoresis and fever.  HENT:  Negative for nosebleeds and sore throat.   Eyes:  Negative for double vision.  Respiratory:  Negative for cough, hemoptysis, sputum production, shortness of breath and wheezing.   Cardiovascular:  Negative for chest pain,  palpitations, orthopnea and leg swelling.  Gastrointestinal:  Positive for nausea. Negative for abdominal pain, blood in stool, constipation, diarrhea, heartburn, melena and vomiting.  Genitourinary:  Negative for dysuria, frequency and urgency.  Musculoskeletal:  Positive for back pain and joint pain.  Skin: Negative.  Negative for itching and rash.  Neurological:  Negative for dizziness, tingling, focal weakness, weakness and headaches.  Endo/Heme/Allergies:  Does not bruise/bleed easily.  Psychiatric/Behavioral:  Negative for depression. The patient is not nervous/anxious and does not have insomnia.     MEDICAL HISTORY:  Past Medical History:  Diagnosis Date   Actinic keratosis    Chronic pain    GERD (gastroesophageal reflux disease) ?   Taking Pepcid   History of basal cell carcinoma (BCC) 05/02/2021   right upper back paraspinal   History of SCC (squamous cell carcinoma) of skin 09/10/2019   left dorsum wrist ED&C done 10/28/19   History of SCC (squamous cell carcinoma) of skin 03/04/2020   right dorsum hand   History of squamous cell carcinoma in situ (SCCIS) 03/04/2020   left dorsum wrist  ED&C   History of squamous cell carcinoma in situ (SCCIS) 03/04/2020   right medial infraorbital  ED&C 04/28/2020   History of squamous cell carcinoma in situ (SCCIS) 10/28/2019   right dorsum hand proximal lateral   History of squamous cell carcinoma in situ (SCCIS) 10/28/2019   right dorsum hand proximal medial   Hyperlipidemia    Hypertension    NSTEMI (non-ST elevated myocardial infarction) (HCC) 04/12/2023   Renal disorder    Squamous cell carcinoma in situ 10/31/2021   left distal  tricep, EDC   Squamous cell carcinoma of skin 08/28/2022   Right volar forearm - EDC    SURGICAL HISTORY: Past Surgical History:  Procedure Laterality Date   ABDOMINAL HYSTERECTOMY     APPENDECTOMY  April 2023   Burst appendix   APPLICATION OF WOUND VAC Right 04/25/2023   Procedure: APPLICATION  OF WOUND VAC;  Surgeon: Renford Dills, MD;  Location: [facility] OR[facility];  Service: Vascular;  Laterality: Right;   APPLICATION OF WOUND VAC Right 04/27/2023   Procedure: WOUND VAC EXCHANGE;  Surgeon: Renford Dills, MD;  Location: [facility];  Service: Vascular;  Laterality: Right;   APPLICATION OF WOUND VAC Right 05/01/2023   Procedure: APPLICATION OF WOUND VAC;  Surgeon: Renford Dills, MD;  Location: [facility];  Service: Vascular;  Laterality: Right;   AUGMENTATION MAMMAPLASTY Bilateral    25-30 years ago   CARDIOVERSION N/A 12/01/2020   Procedure: CARDIOVERSION;  Surgeon: Debbe Odea, MD;  Location: [facility];  Service: Cardiovascular;  Laterality: N/A;   CAROTID ARTERY ANGIOPLASTY Right 1995(approximate)   CAROTID ENDARTERECTOMY  2001   CORONARY STENT INTERVENTION N/A 04/13/2023   Procedure: CORONARY STENT INTERVENTION;  Surgeon: Marcina Millard, MD;  Location: [facility];  Service: Cardiovascular;  Laterality: N/A;   COSMETIC SURGERY     ELBOW SURGERY Left    ENDARTERECTOMY FEMORAL Right 04/13/2023   Procedure: Right groin exploration with repair of right external iliac artery and right circumflex artery;  Surgeon: Bertram Denver, MD;  Location: [facility];  Service: Vascular;  Laterality: Right;   FRACTURE SURGERY  2016   Arm   INCISION AND DRAINAGE OF WOUND Right 04/25/2023   Procedure: IRRIGATION AND DEBRIDEMENT WOUND;  Surgeon: Renford Dills, MD;  Location: [facility];  Service: Vascular;  Laterality: Right;   INCISION AND DRAINAGE OF WOUND Right 04/27/2023   Procedure: IRRIGATION AND DEBRIDEMENT WOUND;  Surgeon: Renford Dills, MD;  Location: [facility];  Service: Vascular;  Laterality: Right;   INCISION AND DRAINAGE OF WOUND Right 05/01/2023   Procedure: IRRIGATION AND DEBRIDEMENT WOUND;  Surgeon: Renford Dills, MD;  Location: [facility];  Service: Vascular;  Laterality: Right;   LEFT HEART CATH AND CORONARY ANGIOGRAPHY N/A 04/13/2023   Procedure: LEFT  HEART CATH AND CORONARY ANGIOGRAPHY;  Surgeon: Marcina Millard, MD;  Location: [facility];  Service: Cardiovascular;  Laterality: N/A;   THORACENTESIS N/A 12/27/2020   Procedure: Alanson Puls;  Surgeon: Josephine Igo, DO;  Location: [facility];  Service: Pulmonary;  Laterality: N/A;   TONSILLECTOMY     TUBAL LIGATION  ?    SOCIAL HISTORY: Social History   Socioeconomic History   Marital status: Married    Spouse name: Not on file   Number of children: 3   Years of education: Not on file   Highest education level: Some college, no degree  Occupational History   Occupation: retired  Tobacco Use   Smoking status: Former    Current packs/day: 0.00    Average packs/day: 0.3 packs/day for 15.0 years (3.8 ttl pk-yrs)    Types: Cigarettes    Start date: 47    Quit date: 1990    Years since quitting: 34.9    Passive exposure: Past   Smokeless tobacco: Never   Tobacco comments:    quit around 1990-1995 (?)  Vaping Use   Vaping status: Never Used  Substance and Sexual Activity   Alcohol use: Yes    Alcohol/week: 14.0 standard drinks of alcohol    Types: 14 Standard drinks or equivalent per week    Comment: 2 scotch shots   Drug use: Never   Sexual  activity: Not Currently    Birth control/protection: None  Other Topics Concern   Not on file  Social History Narrative   Not on file   Social Drivers of Health   Financial Resource Strain: Low Risk  (10/13/2022)   Overall Financial Resource Strain (CARDIA)    Difficulty of Paying Living Expenses: Not hard at all  Food Insecurity: No Food Insecurity (04/23/2023)   Hunger Vital Sign    Worried About Running Out of Food in the Last Year: Never true    Ran Out of Food in the Last Year: Never true  Transportation Needs: No Transportation Needs (04/23/2023)   PRAPARE - Administrator, Civil Service (Medical): No    Lack of Transportation (Non-Medical): No  Physical Activity: Insufficiently Active  (10/13/2022)   Exercise Vital Sign    Days of Exercise per Week: 5 days    Minutes of Exercise per Session: 20 min  Stress: No Stress Concern Present (10/13/2022)   [employer] of Occupational Health - Occupational Stress Questionnaire    Feeling of Stress : Not at all  Social Connections: Unknown (10/13/2022)   Social Connection and Isolation Panel [NHANES]    Frequency of Communication with Friends and Family: More than three times a week    Frequency of Social Gatherings with Friends and Family: Once a week    Attends Religious Services: Not on Insurance claims handler of Clubs or Organizations: No    Attends Banker Meetings: Never    Marital Status: Married  Catering manager Violence: Not At Risk (04/23/2023)   Humiliation, Afraid, Rape, and Kick questionnaire    Fear of Current or Ex-Partner: No    Emotionally Abused: No    Physically Abused: No    Sexually Abused: No    FAMILY HISTORY: Family History  Problem Relation Age of Onset   Cancer Mother    Cancer Father    Cancer Sister    Breast cancer Neg Hx     ALLERGIES:  has no known allergies.  MEDICATIONS:  Current Facility-Administered Medications  Medication Dose Route Frequency Provider Last Rate Last Admin   acetaminophen (TYLENOL) tablet 650 mg  650 mg Oral Q6H PRN Schnier, Latina Craver, MD   650 mg at 05/05/23 1809   albuterol (PROVENTIL) (2.5 MG/3ML) 0.083% nebulizer solution 2.5 mg  2.5 mg Nebulization Q4H PRN Schnier, Latina Craver, MD   2.5 mg at 05/06/23 0731   alum & mag hydroxide-simeth (MAALOX/MYLANTA) 200-200-20 MG/5ML suspension 15 mL  15 mL Oral Once Schnier, Latina Craver, MD       [START ON 05/07/2023] amoxicillin-clavulanate (AUGMENTIN) 500-125 MG per tablet 1 tablet  1 tablet Oral q12n4p Marrion Coy, MD       apixaban Everlene Balls) tablet 2.5 mg  2.5 mg Oral BID Schnier, Latina Craver, MD   2.5 mg at 05/06/23 1026   aspirin EC tablet 81 mg  81 mg Oral Daily Schnier, Latina Craver, MD   81 mg at 05/06/23  1026   atorvastatin (LIPITOR) tablet 40 mg  40 mg Oral Daily Vida Rigger, MD   40 mg at 05/06/23 1026   Chlorhexidine Gluconate Cloth 2 % PADS 6 each  6 each Topical Daily Schnier, Latina Craver, MD   6 each at 05/06/23 1029   [START ON 05/07/2023] ciprofloxacin (CIPRO) tablet 500 mg  500 mg Oral Q breakfast Marrion Coy, MD       clopidogrel (PLAVIX) tablet 75 mg  75 mg Oral  Daily Schnier, Latina Craver, MD   75 mg at 05/06/23 1026   ezetimibe (ZETIA) tablet 10 mg  10 mg Oral Daily Schnier, Latina Craver, MD   10 mg at 05/06/23 1026   fentaNYL (DURAGESIC) 75 MCG/HR 1 patch  1 patch Transdermal Q72H Schnier, Latina Craver, MD   1 patch at 05/05/23 1341   loperamide (IMODIUM) capsule 2 mg  2 mg Oral BID Marrion Coy, MD   2 mg at 05/06/23 1027   oxyCODONE (Oxy IR/ROXICODONE) immediate release tablet 5 mg  5 mg Oral Q6H PRN Schnier, Latina Craver, MD   5 mg at 05/04/23 0008   phenylephrine ((USE for PREPARATION-H)) 0.25 % suppository 1 suppository  1 suppository Rectal PRN Schnier, Latina Craver, MD       pregabalin (LYRICA) capsule 75 mg  75 mg Oral Daily Marrion Coy, MD   75 mg at 05/06/23 1027   sodium chloride flush (NS) 0.9 % injection 10-40 mL  10-40 mL Intracatheter Q12H Schnier, Latina Craver, MD   10 mL at 05/06/23 1030   sodium chloride flush (NS) 0.9 % injection 10-40 mL  10-40 mL Intracatheter PRN Schnier, Latina Craver, MD       traZODone (DESYREL) tablet 50 mg  50 mg Oral QHS Schnier, Latina Craver, MD   50 mg at 05/05/23 2133   witch hazel-glycerin (TUCKS) pad   Topical PRN Marrion Coy, MD        PHYSICAL EXAMINATION:   Vitals:   05/06/23 0852 05/06/23 1729  BP: (!) 146/52 (!) 149/79  Pulse: (!) 103 (!) 102  Resp: 18 18  Temp: 97.9 F (36.6 C) 97.8 F (36.6 C)  SpO2: 100% 92%   Filed Weights   05/04/23 0450 05/05/23 0448 05/06/23 0500  Weight: 165 lb 12.6 oz (75.2 kg) 167 lb 1.7 oz (75.8 kg) 167 lb 1.7 oz (75.8 kg)    Physical Exam Vitals and nursing note reviewed.  HENT:     Head: Normocephalic  and atraumatic.     Mouth/Throat:     Pharynx: Oropharynx is clear.  Eyes:     Extraocular Movements: Extraocular movements intact.     Pupils: Pupils are equal, round, and reactive to light.  Cardiovascular:     Rate and Rhythm: Normal rate and regular rhythm.  Pulmonary:     Comments: Decreased breath sounds bilaterally.  Abdominal:     Palpations: Abdomen is soft.  Musculoskeletal:        General: Normal range of motion.     Cervical back: Normal range of motion.  Skin:    General: Skin is warm.  Neurological:     General: No focal deficit present.     Mental Status: She is alert and oriented to person, place, and time.  Psychiatric:        Behavior: Behavior normal.        Judgment: Judgment normal.     LABORATORY DATA:  I have reviewed the data as listed Lab Results  Component Value Date   WBC 7.2 05/06/2023   HGB 9.8 (L) 05/06/2023   HCT 29.8 (L) 05/06/2023   MCV 87.9 05/06/2023   PLT 50 (L) 05/06/2023   Recent Labs    04/12/23 1739 04/13/23 0415 04/14/23 0624 04/15/23 0420 04/22/23 1528 04/23/23 0631 04/26/23 0216 04/30/23 1630 05/01/23 0456 05/04/23 0530 05/05/23 0525 05/06/23 0509  NA  --    < >  --    < > 137   < > 136 137   < >  137 137 137  K  --    < >  --    < > 3.3*   < > 3.4* 4.1   < > 4.5 4.6 4.6  CL  --    < >  --    < > 104   < > 106 110   < > 106 106 105  CO2  --    < >  --    < > 27   < > 24 18*   < > 22 21* 22  GLUCOSE  --    < >  --    < > 142*   < > 111* 110*   < > 105* 106* 105*  BUN  --    < >  --    < > 30*   < > 20 38*   < > 88* 91* 92*  CREATININE  --    < >  --    < > 1.17*   < > 1.08* 1.66*   < > 4.56* 4.67* 4.67*  CALCIUM  --    < >  --    < > 8.0*   < > 8.0* 8.4*   < > 8.1* 8.3* 8.3*  GFRNONAA  --    < >  --    < > 47*   < > 52* 31*   < > 9* 9* 9*  PROT 7.1   < > 4.0*   < > 5.9*  --  4.8* 4.8*  --   --   --   --   ALBUMIN 3.8   < > 2.5*   < > 2.6*  --  2.6* 2.1*  --   --   --   --   AST 34   < > 131*   < > 54*  --  27 24  --    --   --   --   ALT 21   < > 59*   < > 83*  --  28 25  --   --   --   --   ALKPHOS 76   < > 45   < > 88  --  44 59  --   --   --   --   BILITOT 0.9   < > 1.8*   < > 2.8*  --  0.9 0.9  --   --   --   --   BILIDIR 0.2  --  0.4*  --   --   --   --   --   --   --   --   --   IBILI 0.7  --  1.4*  --   --   --   --   --   --   --   --   --    < > = values in this interval not displayed.    RADIOGRAPHIC STUDIES: I have personally reviewed the radiological images as listed and agreed with the findings in the report. DG Chest Port 1 View Result Date: 05/06/2023 CLINICAL DATA:  [id number] Interstitial edema [id number]. EXAM: PORTABLE CHEST 1 VIEW COMPARISON:  Chest radiograph 05/03/2023. FINDINGS: Unchanged right upper extremity PICC with tip projecting over the low SVC. Similar moderate left pleural effusion and trace right pleural effusion with adjacent atelectasis in the lung bases. No pulmonary edema. Stable cardiac and mediastinal contours. No pneumothorax. IMPRESSION: Similar moderate left pleural effusion and trace right pleural  effusion with adjacent atelectasis in the lung bases. Electronically Signed   By: Orvan Falconer M.D.   On: 05/06/2023 16:16   DG Chest Port 1 View Result Date: 05/03/2023 CLINICAL DATA:  Interstitial edema, hypotension EXAM: PORTABLE CHEST 1 VIEW COMPARISON:  05/01/2023 FINDINGS: Patchy left lower lobe opacity, favoring atelectasis. Small bilateral pleural effusions, left greater than right. This appearance is unchanged from recent prior. No frank interstitial edema.  No pneumothorax. Heart is normal in size Right arm PICC terminates in the lower SVC. IMPRESSION: Patchy left lower lobe opacity, favoring atelectasis. Small bilateral pleural effusions, left greater than right. No frank interstitial edema. Electronically Signed   By: Charline Bills M.D.   On: 05/03/2023 20:15   DG Chest Port 1 View Result Date: 05/01/2023 CLINICAL DATA:  Pleural effusion EXAM: PORTABLE CHEST 1  VIEW COMPARISON:  04/29/2023 FINDINGS: Small bilateral pleural effusions, left greater than right. Associated lower lobe opacities, likely atelectasis. No pneumothorax. The heart is normal in size. Right arm PICC terminating at the cavoatrial junction. IMPRESSION: Small bilateral pleural effusions, left greater than right. Associated lower lobe opacities, likely atelectasis. Electronically Signed   By: Charline Bills M.D.   On: 05/01/2023 17:53   DG Chest Port 1 View Result Date: 04/29/2023 CLINICAL DATA:  Short of breath EXAM: PORTABLE CHEST 1 VIEW COMPARISON:  04/24/2023 FINDINGS: Single frontal view of the chest demonstrates right-sided PICC tip overlying superior vena cava. There are increased bibasilar veiling opacities, which obscure the cardiac silhouette. No pneumothorax. No acute bony abnormalities. IMPRESSION: 1. Worsening bibasilar veiling opacities, consistent with enlarging pleural effusions and progressive basilar consolidation. Electronically Signed   By: Sharlet Salina M.D.   On: 04/29/2023 17:32   DG Chest Port 1 View Result Date: 04/24/2023 CLINICAL DATA:  Status post left thoracentesis. EXAM: PORTABLE CHEST 1 VIEW COMPARISON:  April 22, 2023. FINDINGS: The heart size and mediastinal contours are within normal limits. Minimal bibasilar subsegmental atelectasis is noted with small pleural effusions. Right-sided PICC line is noted with distal tip in expected position of the SVC. The visualized skeletal structures are unremarkable. IMPRESSION: Minimal bibasilar subsegmental atelectasis with small pleural effusions. Electronically Signed   By: Lupita Raider M.D.   On: 04/24/2023 16:59   US THORACENTESIS ASP PLEURAL SPACE W/IMG GUIDE Result Date: 04/24/2023 INDICATION: Congestive heart failure with left pleural effusion. Request for therapeutic thoracentesis. EXAM: ULTRASOUND GUIDED LEFT THORACENTESIS MEDICATIONS: 1% lidocaine 10 mL COMPLICATIONS: None immediate. PROCEDURE: An ultrasound  guided thoracentesis was thoroughly discussed with the patient and questions answered. The benefits, risks, alternatives and complications were also discussed. The patient understands and wishes to proceed with the procedure. Written consent was obtained. Ultrasound was performed to localize and mark an adequate pocket of fluid in the left chest. The area was then prepped and draped in the normal sterile fashion. 1% Lidocaine was used for local anesthesia. Under ultrasound guidance a 6 Fr Safe-T-Centesis catheter was introduced. Thoracentesis was performed. The catheter was removed and a dressing applied. FINDINGS: A total of approximately 1.6 L of dark amber fluid was removed. IMPRESSION: Successful ultrasound guided left thoracentesis yielding 1.6 L of pleural fluid. No pneumothorax on post-procedure chest x-ray. Procedure performed by: Corrin Parker, PA-C Electronically Signed   By: Gilmer Mor D.O.   On: 04/24/2023 16:45   [place] EKG SITE RITE Result Date: 04/23/2023 If Site Rite image not attached, placement could not be confirmed due to current cardiac rhythm.  CT ABDOMEN PELVIS W CONTRAST Result Date: 04/22/2023  CLINICAL DATA:  Right lower quadrant abdominal pain. EXAM: CT ABDOMEN AND PELVIS WITH CONTRAST TECHNIQUE: Multidetector CT imaging of the abdomen and pelvis was performed using the standard protocol following bolus administration of intravenous contrast. RADIATION DOSE REDUCTION: This exam was performed according to the departmental dose-optimization program which includes automated exposure control, adjustment of the mA and/or kV according to patient size and/or use of iterative reconstruction technique. CONTRAST:  75mL OMNIPAQUE IOHEXOL 300 MG/ML  SOLN COMPARISON:  April 13, 2023. FINDINGS: Lower chest: Large left pleural effusion is noted with small right pleural effusion, with adjacent atelectasis of the lower lobes. Hepatobiliary: No focal liver abnormality is seen. No gallstones,  gallbladder wall thickening, or biliary dilatation. Pancreas: Unremarkable. No pancreatic ductal dilatation or surrounding inflammatory changes. Spleen: Normal in size without focal abnormality. Adrenals/Urinary Tract: Adrenal glands and kidneys appear normal. No hydronephrosis or renal obstruction is noted. Urinary bladder is displaced to the left by moderate size right-sided pelvic hematoma which appears to be slightly enlarged compared to prior exam. Stomach/Bowel: The stomach is unremarkable. There is no evidence of bowel obstruction or inflammation. Status post appendectomy. Vascular/Lymphatic: Aortic atherosclerosis. No enlarged abdominal or pelvic lymph nodes. Reproductive: Status post hysterectomy. No adnexal masses. Other: As noted above, moderate size right-sided retroperitoneal hematoma is noted which extends into right pelvic region, where it is mildly enlarged. Subcutaneous gas is noted in the right lower quadrant suggesting postsurgical change. Musculoskeletal: No acute or significant osseous findings. IMPRESSION: Moderate sized right retroperitoneal hematoma is noted which extends into right pelvic region and displaces urinary bladder to the left. The pelvic component appears to be mildly enlarged compared to prior exam. Bilateral pleural effusions are noted, left greater than right, with associated atelectasis of the lower lobes. Electronically Signed   By: Lupita Raider M.D.   On: 04/22/2023 17:45   DG Chest Port 1 View Result Date: 04/22/2023 CLINICAL DATA:  Possible wound infection.  Hypotension. EXAM: PORTABLE CHEST 1 VIEW COMPARISON:  April 16, 2023. FINDINGS: Interval development of moderate size left pleural effusion with probable underlying atelectasis or pneumonia. Right lung is clear. IMPRESSION: Interval development of moderate size left pleural effusion with probable underlying atelectasis or infiltrate. Electronically Signed   By: Lupita Raider M.D.   On: 04/22/2023 16:49    US Venous Img Lower Unilateral Right (DVT) Result Date: 04/18/2023 CLINICAL DATA:  Lower extremity edema EXAM: RIGHT LOWER EXTREMITY VENOUS DOPPLER ULTRASOUND TECHNIQUE: Gray-scale sonography with compression, as well as color and duplex ultrasound, were performed to evaluate the deep venous system(s) from the level of the common femoral vein through the popliteal and proximal calf veins. COMPARISON:  None Available. FINDINGS: VENOUS The common femoral vein and central profundus femoral vein is obscured by overlying medical device. Normal compressibility of the visualized femoral and popliteal veins, as well as the visualized calf veins. No filling defects to suggest DVT on grayscale or color Doppler imaging. Doppler waveforms show normal direction of venous flow, normal respiratory plasticity and response to augmentation. Limited views of the contralateral common femoral vein are unremarkable. OTHER 4.4 cm fluid collection within the popliteal fossa noted in keeping with a Baker cyst. Limitations: none IMPRESSION: 1. Limited evaluation of the right common femoral and central profundus femoral veins. No evidence of femoropopliteal DVT or calf DVT within the visualized right lower extremity. 2. 4.4 cm Baker cyst. Electronically Signed   By: Helyn Numbers M.D.   On: 04/18/2023 20:55   US Venous Img Upper Uni Right(DVT)  Result Date: 04/16/2023 CLINICAL DATA:  Right upper extremity swelling for 3 days EXAM: RIGHT UPPER EXTREMITY VENOUS DOPPLER ULTRASOUND TECHNIQUE: Gray-scale sonography with graded compression, as well as color Doppler and duplex ultrasound were performed to evaluate the upper extremity deep venous system from the level of the subclavian vein and including the jugular, axillary, basilic, radial, ulnar and upper cephalic vein. Spectral Doppler was utilized to evaluate flow at rest and with distal augmentation maneuvers. COMPARISON:  None Available. FINDINGS: Contralateral Subclavian Vein:  Respiratory phasicity is normal and symmetric with the symptomatic side. No evidence of thrombus. Normal compressibility. Internal Jugular Vein: No evidence of thrombus. Normal compressibility, respiratory phasicity and response to augmentation. Subclavian Vein: No evidence of thrombus. Normal compressibility, respiratory phasicity and response to augmentation. Axillary Vein: No evidence of thrombus. Normal compressibility, respiratory phasicity and response to augmentation. Cephalic Vein: Occlusive superficial thrombophlebitis of the mid to distal right cephalic vein. Proximal right cephalic vein is patent and compressible and without thrombus. Basilic Vein: No evidence of thrombus. Normal compressibility, respiratory phasicity and response to augmentation. Brachial Veins: No evidence of thrombus. Normal compressibility, respiratory phasicity and response to augmentation. Radial Veins: No evidence of thrombus. Normal compressibility, respiratory phasicity and response to augmentation. Ulnar Veins: No evidence of thrombus. Normal compressibility, respiratory phasicity and response to augmentation. Venous Reflux:  None visualized. Other Findings: Right internal jugular central venous catheter seen within the right internal jugular vein without surrounding thrombus. IMPRESSION: 1. Occlusive superficial thrombophlebitis of the mid to distal right cephalic vein. 2. No evidence of deep venous thrombosis in the right upper extremity. Electronically Signed   By: Delbert Phenix M.D.   On: 04/16/2023 13:47   DG Chest Port 1 View Result Date: 04/16/2023 CLINICAL DATA:  77 year old female with history of pleural effusions. EXAM: PORTABLE CHEST 1 VIEW COMPARISON:  Chest x-ray 04/15/2023. FINDINGS: Patient has been extubated. Nasogastric tube seen on the prior study has been removed. Right internal jugular central venous catheter with tip terminating at the superior cavoatrial junction. Opacity at the left base which may  reflect atelectasis and/or consolidation. Small left pleural effusion. Right lung appears clear. No definite right pleural effusion. No pneumothorax. No evidence of pulmonary edema. Heart size is normal. Upper mediastinal contours are within normal limits. IMPRESSION: 1. Support apparatus, as above. 2. Atelectasis and/or consolidation in the left lung base with small left pleural effusion. Electronically Signed   By: Trudie Reed M.D.   On: 04/16/2023 06:56   US RENAL Result Date: 04/15/2023 CLINICAL DATA:  Acute renal failure. EXAM: RENAL / URINARY TRACT ULTRASOUND COMPLETE COMPARISON:  CT abdomen pelvis dated April 13, 2023. FINDINGS: Right Kidney: Renal measurements: 7.5 x 4.3 x 4.1 cm = volume: 69 mL. Echogenicity within normal limits. No mass or hydronephrosis visualized. Left Kidney: Renal measurements: 7.7 x 4.2 x 3.6 cm = volume: 61 mL. Echogenicity within normal limits. No mass or hydronephrosis visualized. Bladder: Decompressed by Foley catheter. Other: Bilateral pleural effusions. Small amount of fluid in the upper abdomen. IMPRESSION: 1. No acute abnormality.  No hydronephrosis. Electronically Signed   By: Obie Dredge M.D.   On: 04/15/2023 18:02   DG Abd 1 View Result Date: 04/15/2023 CLINICAL DATA:  Right retroperitoneal hematoma. EXAM: ABDOMEN - 1 VIEW COMPARISON:  04/14/2023 FINDINGS: No gaseous bowel dilatation. Foley catheter noted in the urinary bladder. Persistent contrast retention in the kidneys. Skin staples overlie the right groin region. IMPRESSION: 1. Nonobstructive bowel gas pattern. 2. Persistent contrast retention in the kidneys. Electronically  Signed   By: Kennith Center M.D.   On: 04/15/2023 14:11   DG Chest Port 1 View Result Date: 04/15/2023 CLINICAL DATA:  Endotracheal tube positioning/placement. EXAM: PORTABLE CHEST 1 VIEW COMPARISON:  Chest radiograph dated 04/14/2023. FINDINGS: The heart size and mediastinal contours are within normal limits. There is mild  bibasilar atelectasis/airspace disease. No pneumothorax. An endotracheal tube terminates in the midthoracic trachea. An enteric tube enters the stomach and terminates below the field of view. IMPRESSION: Mild bibasilar atelectasis/airspace disease. Electronically Signed   By: Romona Curls M.D.   On: 04/15/2023 13:56   ECHOCARDIOGRAM COMPLETE BUBBLE STUDY Result Date: 04/14/2023    ECHOCARDIOGRAM REPORT   Patient Name:   Dawn Bruce Date of Exam: 04/14/2023 Medical Rec #:  [id number]   Height:       63.0 in Accession #:    [id number]  Weight:       128.0 lb Date of Birth:  06-08-1945  BSA:          1.600 m Patient Age:    62 years    BP:           92/63 mmHg Patient Gender: F           HR:           86 bpm. Exam Location:  [facility] Procedure: 2D Echo, Cardiac Doppler, Color Doppler and Saline Contrast Bubble            Study Indications:     CHF (congestive heart failure) (HCC) [[id number]]  History:         Patient has prior history of Echocardiogram examinations. Risk                  Factors:Hypertension.  Sonographer:     Neysa Bonito Roar Referring Phys:  [id number] Dorian Pod HUDSON Diagnosing Phys: Marcina Millard MD IMPRESSIONS  1. Left ventricular ejection fraction, by estimation, is 25 to 30%. The left ventricle has severely decreased function. The left ventricle demonstrates regional wall motion abnormalities (see scoring diagram/findings for description). Left ventricular diastolic parameters were normal.  2. Right ventricular systolic function is normal. The right ventricular size is normal.  3. The mitral valve is normal in structure. Mild mitral valve regurgitation. No evidence of mitral stenosis.  4. The aortic valve is normal in structure. Aortic valve regurgitation is not visualized. No aortic stenosis is present.  5. The inferior vena cava is normal in size with greater than 50% respiratory variability, suggesting right atrial pressure of 3 mmHg. FINDINGS  Left Ventricle: Left ventricular ejection fraction,  by estimation, is 25 to 30%. The left ventricle has severely decreased function. The left ventricle demonstrates regional wall motion abnormalities. The left ventricular internal cavity size was normal  in size. There is no left ventricular hypertrophy. Left ventricular diastolic parameters were normal.  LV Wall Scoring: The apical lateral segment, apical septal segment, and apex are akinetic. The apical anterior segment and apical inferior segment are hypokinetic. Right Ventricle: The right ventricular size is normal. No increase in right ventricular wall thickness. Right ventricular systolic function is normal. Left Atrium: Left atrial size was normal in size. Right Atrium: Right atrial size was normal in size. Pericardium: Trivial pericardial effusion is present. Mitral Valve: The mitral valve is normal in structure. Mild mitral valve regurgitation. No evidence of mitral valve stenosis. MV peak gradient, 5.5 mmHg. The mean mitral valve gradient is 3.0 mmHg. Tricuspid Valve: The tricuspid valve is normal in structure. Tricuspid valve regurgitation is  not demonstrated. No evidence of tricuspid stenosis. Aortic Valve: The aortic valve is normal in structure. Aortic valve regurgitation is not visualized. No aortic stenosis is present. Aortic valve mean gradient measures 3.0 mmHg. Aortic valve peak gradient measures 4.6 mmHg. Aortic valve area, by VTI measures 1.14 cm. Pulmonic Valve: The pulmonic valve was normal in structure. Pulmonic valve regurgitation is not visualized. No evidence of pulmonic stenosis. Aorta: The aortic root is normal in size and structure. Venous: The inferior vena cava is normal in size with greater than 50% respiratory variability, suggesting right atrial pressure of 3 mmHg. IAS/Shunts: No atrial level shunt detected by color flow Doppler. Agitated saline contrast was given intravenously to evaluate for intracardiac shunting. Additional Comments: There is a small pleural effusion in the left  lateral region.  LEFT VENTRICLE PLAX 2D LVIDd:         3.90 cm     Diastology LVIDs:         3.40 cm     LV e' medial:    2.84 cm/s LV PW:         1.00 cm     LV E/e' medial:  28.2 LV IVS:        1.10 cm     LV e' lateral:   4.95 cm/s LVOT diam:     1.40 cm     LV E/e' lateral: 16.2 LV SV:         23 LV SV Index:   14 LVOT Area:     1.54 cm  LV Volumes (MOD) LV vol d, MOD A2C: 48.3 ml LV vol d, MOD A4C: 39.9 ml LV vol s, MOD A2C: 35.0 ml LV vol s, MOD A4C: 28.3 ml LV SV MOD A2C:     13.3 ml LV SV MOD A4C:     39.9 ml LV SV MOD BP:      12.4 ml RIGHT VENTRICLE RV Basal diam:  2.40 cm RV Mid diam:    2.20 cm RV S prime:     6.97 cm/s TAPSE (M-mode): 0.9 cm LEFT ATRIUM             Index        RIGHT ATRIUM          Index LA diam:        3.10 cm 1.94 cm/m   RA Area:     7.55 cm LA Vol (A2C):   39.3 ml 24.57 ml/m  RA Volume:   12.20 ml 7.63 ml/m LA Vol (A4C):   31.6 ml 19.76 ml/m LA Biplane Vol: 35.1 ml 21.94 ml/m  AORTIC VALVE                    PULMONIC VALVE AV Area (Vmax):    1.34 cm     PV Vmax:        0.85 m/s AV Area (Vmean):   1.26 cm     PV Peak grad:   2.9 mmHg AV Area (VTI):     1.14 cm     RVOT Peak grad: 2 mmHg AV Vmax:           107.00 cm/s AV Vmean:          74.200 cm/s AV VTI:            0.203 m AV Peak Grad:      4.6 mmHg AV Mean Grad:      3.0 mmHg LVOT Vmax:  93.40 cm/s LVOT Vmean:        60.600 cm/s LVOT VTI:          0.150 m LVOT/AV VTI ratio: 0.74  AORTA Ao Root diam: 1.90 cm Ao Asc diam:  2.30 cm MITRAL VALVE                TRICUSPID VALVE MV Area (PHT): 5.84 cm     TR Peak grad:   23.6 mmHg MV Area VTI:   0.95 cm     TR Vmax:        243.00 cm/s MV Peak grad:  5.5 mmHg MV Mean grad:  3.0 mmHg     SHUNTS MV Vmax:       1.17 m/s     Systemic VTI:  0.15 m MV Vmean:      80.4 cm/s    Systemic Diam: 1.40 cm MV Decel Time: 130 msec MV E velocity: 80.20 cm/s MV A velocity: 105.00 cm/s MV E/A ratio:  0.76 MV A Prime:    7.1 cm/s Marcina Millard MD Electronically signed by Marcina Millard MD Signature Date/Time: 04/14/2023/2:42:41 PM    Final    DG Abd 1 View Result Date: 04/14/2023 CLINICAL DATA:  Orogastric tube placement EXAM: ABDOMEN - 1 VIEW COMPARISON:  None Available. FINDINGS: Orogastric tube tip seen within the proximal body of the stomach with the proximal side hole in the region of the gastroesophageal junction. Nonobstructive bowel gas pattern. No gross free intraperitoneal gas. No organomegaly. Retained contrast opacifies the renal cortices bilaterally related to CT arteriogram performed 04/13/2023. Degenerative changes noted within the lumbar spine. IMPRESSION: 1. Orogastric tube tip within the proximal body of the stomach. Advancement of the catheter by 5-10 cm may more optimally position the device. Electronically Signed   By: Helyn Numbers M.D.   On: 04/14/2023 02:28   DG Chest Port 1 View Result Date: 04/14/2023 CLINICAL DATA:  Chest tube placement EXAM: PORTABLE CHEST 1 VIEW COMPARISON:  04/13/2023 FINDINGS: Endotracheal tube is seen 2.2 cm above the carina. Nasogastric tube extends into the upper abdomen beyond margin of the examination. Right internal jugular central venous catheter is in place with its tip at the superior cavoatrial junction. Lung volumes are small with mild elevation of the right hemidiaphragm. Retrocardiac opacity persists in keeping with atelectasis or infiltrate within this region. Lungs are otherwise clear. No pneumothorax or pleural effusion. Cardiac size within normal limits. Pulmonary vascularity is normal. No acute bone abnormality. No chest tube identified within the visualized thorax. IMPRESSION: 1. Support tubes in appropriate position. 2. No chest tube identified within the visualized thorax. No pneumothorax. 3. Persistent retrocardiac atelectasis or infiltrate. Electronically Signed   By: Helyn Numbers M.D.   On: 04/14/2023 02:26   CARDIAC CATHETERIZATION Result Date: 04/13/2023   Mid LAD lesion is 30% stenosed.   Ost LAD to  Prox LAD lesion is 99% stenosed.   A drug-eluting stent was successfully placed using a STENT ONYX FRONTIER 3.5X12.   Post intervention, there is a 0% residual stenosis.   There is mild left ventricular systolic dysfunction.   The left ventricular ejection fraction is 45-50% by visual estimate. 1.  High-grade in-stent restenosis ostial LAD 2.  Successful PCI with 3.5 x 12 mm Onyx frontier DES 3.  Right groin hematoma Recommendations 1.  Dual antiplatelet therapy uninterrupted 1 year 2.  Abdominal CT to rule out retroperitoneal bleed 3.  Further recommendations pending abdominal CT results   CT Angio Abd/Pel w/ and/or  w/o Result Date: 04/13/2023 CLINICAL DATA:  Suspected retroperitoneal hemorrhage from cath lab EXAM: CTA ABDOMEN AND PELVIS WITHOUT AND WITH CONTRAST TECHNIQUE: Multidetector CT imaging of the abdomen and pelvis was performed using the standard protocol during bolus administration of intravenous contrast. Multiplanar reconstructed images and MIPs were obtained and reviewed to evaluate the vascular anatomy. RADIATION DOSE REDUCTION: This exam was performed according to the departmental dose-optimization program which includes automated exposure control, adjustment of the mA and/or kV according to patient size and/or use of iterative reconstruction technique. CONTRAST:  80mL OMNIPAQUE IOHEXOL 350 MG/ML SOLN COMPARISON:  CT abdomen pelvis, 12/13/2021 FINDINGS: VASCULAR Large right-sided retroperitoneal hematoma arising in the vicinity of the right external iliac artery and extending into the right paracolic gutter, dimensions at least 18.6 x 7.7 x 6.2 cm (series 15, image 44, series 10, image 139). Brisk arterial extravasation arising in the vicinity of the right inferior epigastric artery origin and distal portion of the right external iliac artery (series 10, image 161, series 15, image 44). Normal contour and caliber of the abdominal aorta. No evidence of aortic aneurysm, dissection, or other acute  aortic pathology. 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 Moderate mixed calcific atherosclerosis. Review of the MIP images confirms the above findings. NON-VASCULAR Lower Chest: Moderate left, small right pleural effusions and associated atelectasis or consolidation. Bilateral breast implants small hiatal hernia. Hepatobiliary: No solid liver abnormality is seen. No gallstones, gallbladder wall thickening, or biliary dilatation. Pancreas: Unremarkable. No pancreatic ductal dilatation or surrounding inflammatory changes. Spleen: Normal in size without significant abnormality. Adrenals/Urinary Tract: Adrenal glands are unremarkable. Extensive, lobulated bilateral renal cortical scarring. No calculi or hydronephrosis. Excreted contrast within the collecting systems and bladder following catheterization. Foley catheter in the bladder. Stomach/Bowel: Stomach is within normal limits. Appendix appears normal. No evidence of bowel wall thickening, distention, or inflammatory changes. Sigmoid diverticula. Lymphatic: No enlarged abdominal or pelvic lymph nodes. Reproductive: No mass or other significant abnormality. Other: No abdominal wall hernia or abnormality. Small volume hemoperitoneum about the liver and in the pelvis (series 10, image 156, series 5, image 17). Musculoskeletal: No acute osseous findings. IMPRESSION: 1. Large right-sided retroperitoneal hematoma arising in the vicinity of the right external iliac artery and extending into the right paracolic gutter, dimensions at least 18.6 x 7.7 x 6.2 cm. 2. Brisk arterial extravasation arising in the vicinity of the right inferior epigastric artery origin and distal portion of the right external iliac artery. 3. Small volume hemoperitoneum about the liver and in the pelvis. 4. Moderate left, small right pleural effusions and associated atelectasis or consolidation. These results  were called by telephone at the time of interpretation on 04/13/2023 at 9:25 pm to [facility]CH[facility] , who verbally acknowledged these results. Aortic Atherosclerosis (ICD10-I70.0). Electronically Signed   By: Jearld Lesch M.D.   On: 04/13/2023 21:38   US THORACENTESIS ASP PLEURAL SPACE W/IMG GUIDE Result Date: 04/13/2023 INDICATION: Patient with history of CAD s/p stenting, admitted with complaints of new chest pain and found to have large left pleural effusion. Request for diagnostic and therapeutic left thoracentesis. EXAM: ULTRASOUND GUIDED LEFT THORACENTESIS MEDICATIONS: 15 mL 1% lidocaine COMPLICATIONS: None immediate. PROCEDURE: An ultrasound guided thoracentesis was thoroughly discussed with the patient and questions answered. The benefits, risks, alternatives and complications were also discussed. The patient understands and wishes to proceed with the procedure. Written consent was obtained. Ultrasound was performed to localize and mark an adequate pocket  of fluid in the left chest. The area was then prepped and draped in the normal sterile fashion. 1% Lidocaine was used for local anesthesia. Under ultrasound guidance a 6 Fr Safe-T-Centesis catheter was introduced. Thoracentesis was performed. The catheter was removed and a dressing applied. FINDINGS: A total of approximately 650 mL of clear yellow fluid was removed. Samples were sent to the laboratory as requested by the clinical team. IMPRESSION: Successful ultrasound guided left thoracentesis yielding 650 mL of pleural fluid. Performed by Lynnette Caffey, PA-C Electronically Signed   By: Olive Bass M.D.   On: 04/13/2023 14:13   DG Chest Port 1 View Result Date: 04/13/2023 CLINICAL DATA:  [id number] Pleural effusion on left [id number]. Status post thoracentesis. EXAM: PORTABLE CHEST 1 VIEW COMPARISON:  04/12/2023. FINDINGS: There is small left pleural effusion with probable associated compressive atelectatic changes in the left lung. There is  interval improvement when compared to the prior exam, status post left thoracentesis. No left pneumothorax. Biapical pleural thickening/calcifications again seen. Bilateral lung fields are otherwise clear. There is subtle blunting of right lateral costophrenic angle, which may represent trace right pleural effusion. Normal cardio-mediastinal silhouette. No acute osseous abnormalities. The soft tissues are within normal limits. IMPRESSION: *Small left pleural effusion, improved since the prior study, status post left thoracentesis. No pneumothorax. Electronically Signed   By: Jules Schick M.D.   On: 04/13/2023 12:58   CT Angio Chest Pulmonary Embolism (PE) W or WO Contrast Result Date: 04/12/2023 CLINICAL DATA:  Chest pain for 1 hour, hypertension EXAM: CT ANGIOGRAPHY CHEST WITH CONTRAST TECHNIQUE: Multidetector CT imaging of the chest was performed using the standard protocol during bolus administration of intravenous contrast. Multiplanar CT image reconstructions and MIPs were obtained to evaluate the vascular anatomy. RADIATION DOSE REDUCTION: This exam was performed according to the departmental dose-optimization program which includes automated exposure control, adjustment of the mA and/or kV according to patient size and/or use of iterative reconstruction technique. CONTRAST:  60mL OMNIPAQUE IOHEXOL 350 MG/ML SOLN COMPARISON:  12/28/2020, 04/12/2023 FINDINGS: Cardiovascular: This is a technically adequate evaluation of the pulmonary vasculature. No filling defects or pulmonary emboli. The heart is unremarkable without pericardial effusion. Normal caliber of the thoracic aorta. Atherosclerosis of the aorta and coronary vasculature. Mediastinum/Nodes: No enlarged mediastinal, hilar, or axillary lymph nodes. Thyroid gland, trachea, and esophagus demonstrate no significant findings. Lungs/Pleura: Large left pleural effusion, volume estimated in excess of 1 L. dependent consolidation within the left lower lobe  most consistent with atelectasis. There is a trace right pleural effusion. No airspace disease or pneumothorax. Bilateral bronchial wall thickening, greatest in the lower lobes. Upper Abdomen: No acute abnormality. Musculoskeletal: No acute or destructive bony abnormalities. Reconstructed images demonstrate no additional findings. Review of the MIP images confirms the above findings. IMPRESSION: 1. No evidence of pulmonary embolus. 2. Large left pleural effusion, volume estimated in excess of 1 L. Dependent left lower lobe atelectasis. 3. Trace right pleural effusion. 4. Bilateral bronchial wall thickening, greatest in the lower lobes, which may reflect bronchitis or reactive airway disease. No evidence of pneumonia. 5. Aortic Atherosclerosis (ICD10-I70.0). Coronary artery atherosclerosis. Electronically Signed   By: Sharlet Salina M.D.   On: 04/12/2023 23:46   DG Chest 2 View Result Date: 04/12/2023 CLINICAL DATA:  Chest pain.  Hypertension. EXAM: CHEST - 2 VIEW COMPARISON:  10/06/2021 FINDINGS: Midline trachea. Mild cardiomegaly. Increase in small to moderate left pleural effusion. Biapical pleural thickening. No pneumothorax. No congestive failure. Worsened left lower lobe airspace disease. IMPRESSION: Increase in  small to moderate left pleural effusion. Progressive adjacent left base airspace disease which could represent atelectasis or infection. Cardiomegaly without congestive failure. Electronically Signed   By: Jeronimo Greaves M.D.   On: 04/12/2023 16:58     No problem-specific Assessment & Plan notes found for this encounter.  # 77 year old female patient with history of CAD-currently status post stent placement; and right groin infection-needing debridement/antibiotics; acute on chronic renal failure -blood contrast noted to have worsening platelet count.  # Thrombocytopenia-baseline 200-300.  Currently 50.  No bleeding noted.  Patient on antiplatelet therapy.  Low clinical suspicion for  microangiopathic hemolytic syndrome/like TTP HUS.  Low clinical concerns for heparin-induced thrombocytopenia.  Clinical suspect secondary to Zosyn/medication-induced.  # Right groin infection on Zosyn-   # Recent STEMI/CAD status post antiplatelet therapy  # Worsening renal failure-on chronic kidney disease/contrast exposure  Recommendations/plan:  # Personal review of peripheral smear-no evidence of any schistocytes.  However will request review of smear by the pathologist in the morning for confirmation.  # Thank you Dr.Zhang for allowing me to participate in the care of your pleasant patient. Please do not hesitate to contact me with questions or concerns in the interim. Above plan of care was discussed with patient, and her husband over the phone in detail.  My contact information was given to the patient/family. Discussed with Dr.Zhang.     Earna Coder, MD 05/06/2023 7:44 PM

## 2023-05-06 NOTE — Progress Notes (Addendum)
[facility]  ROUNDING NOTE   Subjective:  Dawn Bruce  is a 77 y.o. female with medical problems of hypertension, heart failure with preserved ejection fraction, history of pleural effusions, history of diffuse vascular disease including coronary artery disease-history of PCI in LAD stent October 2023, chronic kidney disease, prediabetes, atrial fibrillation status post cardioversion in 2022 followed by Dr. Alyson Locket at [facility] nephrology. Patient presents to the ED with hypotension and suspected wound infection. She has been admitted for Cellulitis of right groin [L03.314] Cellulitis of right thigh [L03.115]   Husband and daughter at bedside. Patient endorses new shortness of breath and heaviness bilaterally on anterior lower lung fields. Patient reports urine output frequent but "may be less than what she thinks"   Objective:  Vital signs in last 24 hours:  Temp:  [97.7 F (36.5 C)-98.4 F (36.9 C)] 97.9 F (36.6 C) (12/15 0852) Pulse Rate:  [96-103] 103 (12/15 0852) Resp:  [16-20] 18 (12/15 0852) BP: (146-147)/(52-76) 146/52 (12/15 0852) SpO2:  [95 %-100 %] 100 % (12/15 0852) Weight:  [75.8 kg] 75.8 kg (12/15 0500)  Weight change: 0 kg Filed Weights   05/04/23 0450 05/05/23 0448 05/06/23 0500  Weight: 75.2 kg 75.8 kg 75.8 kg    Intake/Output: I/O last 3 completed shifts: In: 1180 [P.O.:1080; IV Piggyback:100] Out: 1900 [Urine:1900]   Intake/Output this shift:  Total I/O In: -  Out: 400 [Urine:400]  Physical Exam: General: NAD,   Head: Normocephalic, atraumatic. Moist oral mucosal membranes  Eyes: Anicteric, PERRL  Neck: Supple, trachea midline  Lungs:  Clear to auscultation  Heart: Regular rate and rhythm  Abdomen:  Soft, nontender,   Extremities: Dependent bilateral +2 edema in femoral region   Neurologic: Nonfocal, moving all four extremities  Skin: Bruising       Basic Metabolic Panel: Recent Labs  Lab 05/02/23 0550 05/03/23 0659 05/04/23 0530  05/05/23 0525 05/06/23 0509  NA 139 137 137 137 137  K 4.5 4.6 4.5 4.6 4.6  CL 108 106 106 106 105  CO2 23 21* 22 21* 22  GLUCOSE 120* 105* 105* 106* 105*  BUN 55* 79* 88* 91* 92*  CREATININE 2.78* 3.95* 4.56* 4.67* 4.67*  CALCIUM 8.4* 8.2* 8.1* 8.3* 8.3*  MG  --  2.3 2.2  --   --   PHOS  --   --  6.8*  --   --     Liver Function Tests: Recent Labs  Lab 04/30/23 1630  AST 24  ALT 25  ALKPHOS 59  BILITOT 0.9  PROT 4.8*  ALBUMIN 2.1*   No results for input(s): "LIPASE", "AMYLASE" in the last 168 hours. No results for input(s): "AMMONIA" in the last 168 hours.  CBC: Recent Labs  Lab 05/01/23 0456 05/02/23 0550 05/03/23 0659 05/04/23 0530 05/06/23 0509  WBC 8.9 8.0 7.8 6.8 6.3  HGB 8.7* 7.6* 9.8* 9.5* 9.5*  HCT 26.7* 23.5* 30.3* 29.1* 28.9*  MCV 86.4 88.3 90.2 88.2 86.5  PLT 226 166 117* 81* 51*    Cardiac Enzymes: Recent Labs  Lab 05/04/23 0530  CKTOTAL 38    BNP: Invalid input(s): "POCBNP"  CBG: Recent Labs  Lab 04/29/23 1648  GLUCAP 101*    Microbiology: Results for orders placed or performed during the hospital encounter of 04/22/23  Blood Culture (routine x 2)     Status: None   Collection Time: 04/22/23  3:28 PM   Specimen: BLOOD  Result Value Ref Range Status   Specimen Description BLOOD RIGHT ANTECUBITAL  Final   Special Requests   Final    BOTTLES DRAWN AEROBIC AND ANAEROBIC Blood Culture results may not be optimal due to an excessive volume of blood received in culture bottles   Culture   Final    NO GROWTH 5 DAYS Performed at [facility], [address] [place], [place] [address]    Report Status 04/27/2023 FINAL  Final  C Difficile Quick Screen w PCR reflex     Status: None   Collection Time: 04/22/23  4:19 PM   Specimen: STOOL  Result Value Ref Range Status   C Diff antigen NEGATIVE NEGATIVE Final   C Diff toxin NEGATIVE NEGATIVE Final   C Diff interpretation No C. difficile detected.  Final    Comment: Performed  at [facility], [address] [place], [place] [address]  Gastrointestinal Panel by PCR , Stool     Status: None   Collection Time: 04/22/23  4:25 PM   Specimen: Stool  Result Value Ref Range Status   Campylobacter species NOT DETECTED NOT DETECTED Final   Plesimonas shigelloides NOT DETECTED NOT DETECTED Final   Salmonella species NOT DETECTED NOT DETECTED Final   Yersinia enterocolitica NOT DETECTED NOT DETECTED Final   Vibrio species NOT DETECTED NOT DETECTED Final   Vibrio cholerae NOT DETECTED NOT DETECTED Final   Enteroaggregative E coli (EAEC) NOT DETECTED NOT DETECTED Final   Enteropathogenic E coli (EPEC) NOT DETECTED NOT DETECTED Final   Enterotoxigenic E coli (ETEC) NOT DETECTED NOT DETECTED Final   Shiga like toxin producing E coli (STEC) NOT DETECTED NOT DETECTED Final   Shigella/Enteroinvasive E coli (EIEC) NOT DETECTED NOT DETECTED Final   Cryptosporidium NOT DETECTED NOT DETECTED Final   Cyclospora cayetanensis NOT DETECTED NOT DETECTED Final   Entamoeba histolytica NOT DETECTED NOT DETECTED Final   Giardia lamblia NOT DETECTED NOT DETECTED Final   Adenovirus F40/41 NOT DETECTED NOT DETECTED Final   Astrovirus NOT DETECTED NOT DETECTED Final   Norovirus GI/GII NOT DETECTED NOT DETECTED Final   Rotavirus A NOT DETECTED NOT DETECTED Final   Sapovirus (I, II, IV, and V) NOT DETECTED NOT DETECTED Final    Comment: Performed at [facility], [address] [place], [place] [address]  Blood Culture (routine x 2)     Status: None   Collection Time: 04/23/23  6:31 AM   Specimen: BLOOD  Result Value Ref Range Status   Specimen Description BLOOD BLOOD RIGHT HAND  Final   Special Requests   Final    BOTTLES DRAWN AEROBIC AND ANAEROBIC Blood Culture results may not be optimal due to an inadequate volume of blood received in culture bottles   Culture   Final    NO GROWTH 5 DAYS Performed at [facility], [address] [place], [place]  [address]    Report Status 04/28/2023 FINAL  Final  Aerobic Culture w Gram Stain (superficial specimen)     Status: None   Collection Time: 04/23/23 10:42 AM   Specimen: Groin  Result Value Ref Range Status   Specimen Description   Final    GROIN Performed at [facility], [address], [place], [place] [address]    Special Requests   Final    NONE Performed at [facility], [address] [place], [place] [address]    Gram Stain   Final    RARE WBC PRESENT, PREDOMINANTLY PMN RARE GRAM NEGATIVE RODS RARE GRAM NEGATIVE COCCOBACILLI Performed at [facility], [address], [place], [place] [address]    Culture   Final    MODERATE MORGANELLA MORGANII FEW ESCHERICHIA COLI FEW PROTEUS MIRABILIS    Report Status 04/26/2023 FINAL  Final   Organism ID, Bacteria ESCHERICHIA COLI  Final   Organism ID, Bacteria MORGANELLA MORGANII  Final   Organism ID, Bacteria PROTEUS MIRABILIS  Final      Susceptibility   Escherichia coli - MIC*    AMPICILLIN >=32 RESISTANT Resistant     CEFEPIME <=0.12 SENSITIVE Sensitive     CEFTAZIDIME <=1 SENSITIVE Sensitive     CEFTRIAXONE <=0.25 SENSITIVE Sensitive     CIPROFLOXACIN <=0.25 SENSITIVE Sensitive     GENTAMICIN <=1 SENSITIVE Sensitive     IMIPENEM <=0.25 SENSITIVE Sensitive     TRIMETH/SULFA <=20 SENSITIVE Sensitive     AMPICILLIN/SULBACTAM 16 INTERMEDIATE Intermediate     PIP/TAZO <=4 SENSITIVE Sensitive ug/mL    * FEW ESCHERICHIA COLI   Morganella morganii - MIC*    AMPICILLIN >=32 RESISTANT Resistant     CEFTAZIDIME <=1 SENSITIVE Sensitive     CIPROFLOXACIN <=0.25 SENSITIVE Sensitive     GENTAMICIN <=1 SENSITIVE Sensitive     IMIPENEM 4 SENSITIVE Sensitive     TRIMETH/SULFA <=20 SENSITIVE Sensitive     AMPICILLIN/SULBACTAM 16 INTERMEDIATE Intermediate     PIP/TAZO <=4 SENSITIVE Sensitive ug/mL    * MODERATE MORGANELLA MORGANII   Proteus mirabilis - MIC*    AMPICILLIN <=2 SENSITIVE Sensitive     CEFEPIME  <=0.12 SENSITIVE Sensitive     CEFTAZIDIME <=1 SENSITIVE Sensitive     CEFTRIAXONE <=0.25 SENSITIVE Sensitive     CIPROFLOXACIN <=0.25 SENSITIVE Sensitive     GENTAMICIN <=1 SENSITIVE Sensitive     IMIPENEM 2 SENSITIVE Sensitive     TRIMETH/SULFA <=20 SENSITIVE Sensitive     AMPICILLIN/SULBACTAM <=2 SENSITIVE Sensitive     PIP/TAZO <=4 SENSITIVE Sensitive ug/mL    * FEW PROTEUS MIRABILIS  Aerobic/Anaerobic Culture w Gram Stain (surgical/deep wound)     Status: None   Collection Time: 04/25/23  2:41 PM   Specimen: Wound; Body Fluid  Result Value Ref Range Status   Specimen Description   Final    WOUND Performed at [facility], [address] [place], [place] [address]    Special Requests RETROPERITONEAL HEMATOMA SPEC A  Final   Gram Stain   Final    FEW WBC PRESENT,BOTH PMN AND MONONUCLEAR NO ORGANISMS SEEN    Culture   Final    RARE ENTEROCOCCUS FAECALIS NO ANAEROBES ISOLATED Performed at [facility], [address] [place], [place] [address]    Report Status 05/02/2023 FINAL  Final   Organism ID, Bacteria ENTEROCOCCUS FAECALIS  Final      Susceptibility   Enterococcus faecalis - MIC*    AMPICILLIN <=2 SENSITIVE Sensitive     VANCOMYCIN 2 SENSITIVE Sensitive     GENTAMICIN SYNERGY SENSITIVE Sensitive     * RARE ENTEROCOCCUS FAECALIS    Coagulation Studies: No results for input(s): "LABPROT", "INR" in the last 72 hours.  Urinalysis: No results for input(s): "COLORURINE", "LABSPEC", "PHURINE", "GLUCOSEU", "HGBUR", "BILIRUBINUR", "KETONESUR", "PROTEINUR", "UROBILINOGEN", "NITRITE", "LEUKOCYTESUR" in the last 72 hours.  Invalid input(s): "APPERANCEUR"    Imaging: No results found.   Medications:    piperacillin-tazobactam (ZOSYN)  IV 2.25 g (05/06/23 0502)    alum & mag hydroxide-simeth  15 mL Oral Once   apixaban  2.5 mg Oral BID   aspirin EC  81  mg Oral Daily   atorvastatin  40 mg Oral Daily   Chlorhexidine Gluconate Cloth  6 each Topical  Daily   clopidogrel  75 mg Oral Daily   ezetimibe  10 mg Oral Daily   fentaNYL  1 patch Transdermal Q72H    HYDROmorphone (DILAUDID) injection  0.5 mg Intravenous Once   loperamide  2 mg Oral BID   pregabalin  75 mg Oral Daily   sodium chloride flush  10-40 mL Intracatheter Q12H   traZODone  50 mg Oral QHS   acetaminophen, albuterol, oxyCODONE, phenylephrine, sodium chloride flush, witch hazel-glycerin  Assessment/ Plan:  Ms. Iryna Corbo is a 77 y.o.  female with medical problems of hypertension, heart failure with preserved ejection fraction, history of pleural effusions, history of diffuse vascular disease including coronary artery disease-history of PCI in LAD stent October 2023, chronic kidney disease, prediabetes, atrial fibrillation status post cardioversion in 2022 followed by Dr. Alyson Locket at [facility] nephrology. Patient presents to the ED with hypotension and suspected wound infection. She has been admitted for Cellulitis of right groin [L03.314] Cellulitis of right thigh [L03.115]     Acute Kidney Injury on chronic kidney disease stage IIIb with baseline creatinine 1.3 and GFR of 42 on 03/22/23.  Acute kidney injury appears multifactorial, hypotension, IV contrast exposure, and diuretics. Creatinine on admission, 1.17.  Patient experienced hypotension prior to ED arrival.  IV contrast exposure on 04/22/2023.  Received approximately 2 days of diuresis.               Creatinine seems to have plateaued. Discussed the possibility of temporary dialysis if renal function continues to worsen.  Patient and family will discuss this.  No acute indication for dialysis today.              - holding IV fluids due to increased SOB, encourage PO intake. May consider diuretic tomorrow for fluid removal. Monitor UO             - more accurate urine output recording.    Lab Results  Component Value Date   CREATININE 4.67 (H) 05/06/2023   CREATININE 4.67 (H) 05/05/2023   CREATININE 4.56 (H) 05/04/2023        Intake/Output Summary (Last 24 hours) at 05/06/2023 1312 Last data filed at 05/06/2023 0834 Gross per 24 hour  Intake 360 ml  Output 1000 ml  Net -640 ml      2. Anemia of chronic kidney disease Recent Labs       Lab Results  Component Value Date    HGB 9.5 (L) 05/04/2023      Hemoglobin stable. No indication for ESA    3.  Hypertension: home regimen of carvedilol, isosorbide mononitrate and valsartan             - continue to hold blood pressure medications. BP 146/52   LOS: 14 Cashawn Yanko P Shalina Norfolk 12/15/20241:05 PM

## 2023-05-06 NOTE — Consult Note (Signed)
CARDIOLOGY CONSULT NOTE               Patient ID: Dawn Bruce MRN: [id number] DOB/AGE: August 20, 1945 77 y.o.  Admit date: 04/22/2023 Referring Physician Dr. Chipper Herb hospitalist Primary Physician Dr Fermin Schwab MD Primary Cardiologist Paraschos Reason for Consultation atrial fibrillation recent non-STEMI with PCI and stent right groin cellulitis  HPI: 77 year old female recent non-STEMI diastolic congestive heart failure coronary artery disease recent PCI and stent DES to ostial LAD currently on Plavix Eliquis for atrial fibrillation hypertension hyperlipidemia renal insufficiency with worsening renal function acutely with hypotension former smoker recently suffered a complication of retroperitoneal hematoma requiring surgical treatment resulting in a wound VAC now has cellulitis requiring wound debridement with hypotension and possible sepsis on broad-spectrum antibiotic therapy with hypotension worsening renal insufficiency now with thrombocytopenia the concern was holding Plavix and/or Eliquis because of thrombocytopenia no evidence of bleeding currently  Review of systems complete and found to be negative unless listed above     Past Medical History:  Diagnosis Date   Actinic keratosis    Chronic pain    GERD (gastroesophageal reflux disease) ?   Taking Pepcid   History of basal cell carcinoma (BCC) 05/02/2021   right upper back paraspinal   History of SCC (squamous cell carcinoma) of skin 09/10/2019   left dorsum wrist ED&C done 10/28/19   History of SCC (squamous cell carcinoma) of skin 03/04/2020   right dorsum hand   History of squamous cell carcinoma in situ (SCCIS) 03/04/2020   left dorsum wrist  ED&C   History of squamous cell carcinoma in situ (SCCIS) 03/04/2020   right medial infraorbital  ED&C 04/28/2020   History of squamous cell carcinoma in situ (SCCIS) 10/28/2019   right dorsum hand proximal lateral   History of squamous cell carcinoma in situ (SCCIS)  10/28/2019   right dorsum hand proximal medial   Hyperlipidemia    Hypertension    NSTEMI (non-ST elevated myocardial infarction) (HCC) 04/12/2023   Renal disorder    Squamous cell carcinoma in situ 10/31/2021   left distal  tricep, EDC   Squamous cell carcinoma of skin 08/28/2022   Right volar forearm - EDC    Past Surgical History:  Procedure Laterality Date   ABDOMINAL HYSTERECTOMY     APPENDECTOMY  April 2023   Burst appendix   APPLICATION OF WOUND VAC Right 04/25/2023   Procedure: APPLICATION OF WOUND VAC;  Surgeon: Renford Dills, MD;  Location: [facility];  Service: Vascular;  Laterality: Right;   APPLICATION OF WOUND VAC Right 04/27/2023   Procedure: WOUND VAC EXCHANGE;  Surgeon: Renford Dills, MD;  Location: [facility];  Service: Vascular;  Laterality: Right;   APPLICATION OF WOUND VAC Right 05/01/2023   Procedure: APPLICATION OF WOUND VAC;  Surgeon: Renford Dills, MD;  Location: [facility];  Service: Vascular;  Laterality: Right;   AUGMENTATION MAMMAPLASTY Bilateral    25-30 years ago   CARDIOVERSION N/A 12/01/2020   Procedure: CARDIOVERSION;  Surgeon: Debbe Odea, MD;  Location: [facility];  Service: Cardiovascular;  Laterality: N/A;   CAROTID ARTERY ANGIOPLASTY Right 1995(approximate)   CAROTID ENDARTERECTOMY  2001   CORONARY STENT INTERVENTION N/A 04/13/2023   Procedure: CORONARY STENT INTERVENTION;  Surgeon: Marcina Millard, MD;  Location: [facility];  Service: Cardiovascular;  Laterality: N/A;   COSMETIC SURGERY     ELBOW SURGERY Left    ENDARTERECTOMY FEMORAL Right 04/13/2023   Procedure: Right groin exploration with repair of right external iliac  artery and right circumflex artery;  Surgeon: Bertram Denver, MD;  Location: [facility];  Service: Vascular;  Laterality: Right;   FRACTURE SURGERY  2016   Arm   INCISION AND DRAINAGE OF WOUND Right 04/25/2023   Procedure: IRRIGATION AND DEBRIDEMENT WOUND;  Surgeon: Renford Dills, MD;   Location: [facility];  Service: Vascular;  Laterality: Right;   INCISION AND DRAINAGE OF WOUND Right 04/27/2023   Procedure: IRRIGATION AND DEBRIDEMENT WOUND;  Surgeon: Renford Dills, MD;  Location: [facility];  Service: Vascular;  Laterality: Right;   INCISION AND DRAINAGE OF WOUND Right 05/01/2023   Procedure: IRRIGATION AND DEBRIDEMENT WOUND;  Surgeon: Renford Dills, MD;  Location: [facility];  Service: Vascular;  Laterality: Right;   LEFT HEART CATH AND CORONARY ANGIOGRAPHY N/A 04/13/2023   Procedure: LEFT HEART CATH AND CORONARY ANGIOGRAPHY;  Surgeon: Marcina Millard, MD;  Location: [facility];  Service: Cardiovascular;  Laterality: N/A;   THORACENTESIS N/A 12/27/2020   Procedure: Alanson Puls;  Surgeon: Josephine Igo, DO;  Location: [facility];  Service: Pulmonary;  Laterality: N/A;   TONSILLECTOMY     TUBAL LIGATION  ?    Medications Prior to Admission  Medication Sig Dispense Refill Last Dose/Taking   apixaban (ELIQUIS) 2.5 MG TABS tablet Take 2.5 mg by mouth 2 (two) times daily.   04/22/2023 at 0900   [EXPIRED] aspirin 81 MG chewable tablet Chew 1 tablet (81 mg total) by mouth daily for 10 days.   04/22/2023 at 0900   atorvastatin (LIPITOR) 80 MG tablet Take 80 mg by mouth daily.   04/22/2023 at 0900   carvedilol (COREG) 12.5 MG tablet Take 12.5 mg by mouth 2 (two) times daily with a meal.   04/22/2023   cephALEXin (KEFLEX) 500 MG capsule Take 1 capsule (500 mg total) by mouth 3 (three) times daily. 21 capsule 0 04/22/2023 at 1200   clopidogrel (PLAVIX) 75 MG tablet Take 1 tablet (75 mg total) by mouth daily. 90 tablet 1 04/22/2023 at 0900   ezetimibe (ZETIA) 10 MG tablet TAKE 1 TABLET(10 MG) BY MOUTH DAILY 90 tablet 0 04/22/2023 at 0900   famotidine (PEPCID) 40 MG tablet Take 1 tablet (40 mg total) by mouth daily. (Patient taking differently: Take 40 mg by mouth daily as needed for indigestion or heartburn.) 90 tablet 0 prn at prn   fentaNYL (DURAGESIC) 25 MCG/HR  Place 2 patches onto the skin every 3 (three) days. 20 patch 0 04/20/2023 at Unknown   isosorbide mononitrate (IMDUR) 60 MG 24 hr tablet Take 1 tablet (60 mg total) by mouth daily. 30 tablet 0 04/22/2023 at 0900   pantoprazole (PROTONIX) 40 MG tablet Take 1 tablet (40 mg total) by mouth daily. 30 tablet 0 04/22/2023 at 0900   pregabalin (LYRICA) 150 MG capsule Take 1 capsule (150 mg total) by mouth 2 (two) times daily. 180 capsule 1 04/22/2023 at 0900   shark liver oil-cocoa butter (PREPARATION H) 0.25-3-85.5 % suppository Place 1 suppository rectally as needed for hemorrhoids.   prn at prn   traZODone (DESYREL) 50 MG tablet Take 0.5-1 tablets (25-50 mg total) by mouth at bedtime as needed for sleep. 30 tablet 3 prn at prn   valsartan (DIOVAN) 160 MG tablet Take 160 mg by mouth in the morning and at bedtime.   04/22/2023 at 0900   Social History   Socioeconomic History   Marital status: Married    Spouse name: Not on file   Number of children: 3  Years of education: Not on file   Highest education level: Some college, no degree  Occupational History   Occupation: retired  Tobacco Use   Smoking status: Former    Current packs/day: 0.00    Average packs/day: 0.3 packs/day for 15.0 years (3.8 ttl pk-yrs)    Types: Cigarettes    Start date: 69    Quit date: 1990    Years since quitting: 34.9    Passive exposure: Past   Smokeless tobacco: Never   Tobacco comments:    quit around 1990-1995 (?)  Vaping Use   Vaping status: Never Used  Substance and Sexual Activity   Alcohol use: Yes    Alcohol/week: 14.0 standard drinks of alcohol    Types: 14 Standard drinks or equivalent per week    Comment: 2 scotch shots   Drug use: Never   Sexual activity: Not Currently    Birth control/protection: None  Other Topics Concern   Not on file  Social History Narrative   Not on file   Social Drivers of Health   Financial Resource Strain: Low Risk  (10/13/2022)   Overall Financial Resource  Strain (CARDIA)    Difficulty of Paying Living Expenses: Not hard at all  Food Insecurity: No Food Insecurity (04/23/2023)   Hunger Vital Sign    Worried About Running Out of Food in the Last Year: Never true    Ran Out of Food in the Last Year: Never true  Transportation Needs: No Transportation Needs (04/23/2023)   PRAPARE - Administrator, Civil Service (Medical): No    Lack of Transportation (Non-Medical): No  Physical Activity: Insufficiently Active (10/13/2022)   Exercise Vital Sign    Days of Exercise per Week: 5 days    Minutes of Exercise per Session: 20 min  Stress: No Stress Concern Present (10/13/2022)   [employer] of Occupational Health - Occupational Stress Questionnaire    Feeling of Stress : Not at all  Social Connections: Unknown (10/13/2022)   Social Connection and Isolation Panel [NHANES]    Frequency of Communication with Friends and Family: More than three times a week    Frequency of Social Gatherings with Friends and Family: Once a week    Attends Religious Services: Not on Insurance claims handler of Clubs or Organizations: No    Attends Banker Meetings: Never    Marital Status: Married  Catering manager Violence: Not At Risk (04/23/2023)   Humiliation, Afraid, Rape, and Kick questionnaire    Fear of Current or Ex-Partner: No    Emotionally Abused: No    Physically Abused: No    Sexually Abused: No    Family History  Problem Relation Age of Onset   Cancer Mother    Cancer Father    Cancer Sister    Breast cancer Neg Hx       Review of systems complete and found to be negative unless listed above      PHYSICAL EXAM  General: Well developed, well nourished, in no acute distress HEENT:  Normocephalic and atramatic Neck:  No JVD.  Lungs: Clear bilaterally to auscultation and percussion. Heart: HRRR . Normal S1 and S2 without gallops or murmurs.  Abdomen: Bowel sounds are positive, abdomen soft and non-tender  Msk:  Back  normal, normal gait. Normal strength and tone for age. Extremities: No clubbing, cyanosis or edema.  Right groin wound cellulitis Neuro: Alert and oriented X 3. Psych:  Good affect, responds appropriately  Labs:   Lab Results  Component Value Date   WBC 6.3 05/06/2023   HGB 9.5 (L) 05/06/2023   HCT 28.9 (L) 05/06/2023   MCV 86.5 05/06/2023   PLT 51 (L) 05/06/2023    Recent Labs  Lab 04/30/23 1630 05/01/23 0456 05/06/23 0509  NA 137   < > 137  K 4.1   < > 4.6  CL 110   < > 105  CO2 18*   < > 22  BUN 38*   < > 92*  CREATININE 1.66*   < > 4.67*  CALCIUM 8.4*   < > 8.3*  PROT 4.8*  --   --   BILITOT 0.9  --   --   ALKPHOS 59  --   --   ALT 25  --   --   AST 24  --   --   GLUCOSE 110*   < > 105*   < > = values in this interval not displayed.   Lab Results  Component Value Date   CKTOTAL 38 05/04/2023    Lab Results  Component Value Date   CHOL 151 04/12/2023   CHOL 156 06/01/2022   CHOL 201 (H) 10/25/2021   Lab Results  Component Value Date   HDL 61 04/12/2023   HDL 73 06/01/2022   HDL 71 10/25/2021   Lab Results  Component Value Date   LDLCALC 67 04/12/2023   LDLCALC 73 06/01/2022   LDLCALC 108 (H) 10/25/2021   Lab Results  Component Value Date   TRIG 75 04/15/2023   TRIG 115 04/12/2023   TRIG 46 06/01/2022   Lab Results  Component Value Date   CHOLHDL 2.5 04/12/2023   CHOLHDL 2.1 06/01/2022   CHOLHDL 2.8 10/25/2021   No results found for: "LDLDIRECT"    Radiology: [facility] Chest Port 1 View Result Date: 05/03/2023 CLINICAL DATA:  Interstitial edema, hypotension EXAM: PORTABLE CHEST 1 VIEW COMPARISON:  05/01/2023 FINDINGS: Patchy left lower lobe opacity, favoring atelectasis. Small bilateral pleural effusions, left greater than right. This appearance is unchanged from recent prior. No frank interstitial edema.  No pneumothorax. Heart is normal in size Right arm PICC terminates in the lower SVC. IMPRESSION: Patchy left lower lobe opacity, favoring  atelectasis. Small bilateral pleural effusions, left greater than right. No frank interstitial edema. Electronically Signed   By: Charline Bills M.D.   On: 05/03/2023 20:15   DG Chest Port 1 View Result Date: 05/01/2023 CLINICAL DATA:  Pleural effusion EXAM: PORTABLE CHEST 1 VIEW COMPARISON:  04/29/2023 FINDINGS: Small bilateral pleural effusions, left greater than right. Associated lower lobe opacities, likely atelectasis. No pneumothorax. The heart is normal in size. Right arm PICC terminating at the cavoatrial junction. IMPRESSION: Small bilateral pleural effusions, left greater than right. Associated lower lobe opacities, likely atelectasis. Electronically Signed   By: Charline Bills M.D.   On: 05/01/2023 17:53   DG Chest Port 1 View Result Date: 04/29/2023 CLINICAL DATA:  Short of breath EXAM: PORTABLE CHEST 1 VIEW COMPARISON:  04/24/2023 FINDINGS: Single frontal view of the chest demonstrates right-sided PICC tip overlying superior vena cava. There are increased bibasilar veiling opacities, which obscure the cardiac silhouette. No pneumothorax. No acute bony abnormalities. IMPRESSION: 1. Worsening bibasilar veiling opacities, consistent with enlarging pleural effusions and progressive basilar consolidation. Electronically Signed   By: Sharlet Salina M.D.   On: 04/29/2023 17:32   DG Chest Port 1 View Result Date: 04/24/2023 CLINICAL DATA:  Status post left thoracentesis. EXAM: PORTABLE CHEST  1 VIEW COMPARISON:  April 22, 2023. FINDINGS: The heart size and mediastinal contours are within normal limits. Minimal bibasilar subsegmental atelectasis is noted with small pleural effusions. Right-sided PICC line is noted with distal tip in expected position of the SVC. The visualized skeletal structures are unremarkable. IMPRESSION: Minimal bibasilar subsegmental atelectasis with small pleural effusions. Electronically Signed   By: Lupita Raider M.D.   On: 04/24/2023 16:59   US THORACENTESIS ASP  PLEURAL SPACE W/IMG GUIDE Result Date: 04/24/2023 INDICATION: Congestive heart failure with left pleural effusion. Request for therapeutic thoracentesis. EXAM: ULTRASOUND GUIDED LEFT THORACENTESIS MEDICATIONS: 1% lidocaine 10 mL COMPLICATIONS: None immediate. PROCEDURE: An ultrasound guided thoracentesis was thoroughly discussed with the patient and questions answered. The benefits, risks, alternatives and complications were also discussed. The patient understands and wishes to proceed with the procedure. Written consent was obtained. Ultrasound was performed to localize and mark an adequate pocket of fluid in the left chest. The area was then prepped and draped in the normal sterile fashion. 1% Lidocaine was used for local anesthesia. Under ultrasound guidance a 6 Fr Safe-T-Centesis catheter was introduced. Thoracentesis was performed. The catheter was removed and a dressing applied. FINDINGS: A total of approximately 1.6 L of dark amber fluid was removed. IMPRESSION: Successful ultrasound guided left thoracentesis yielding 1.6 L of pleural fluid. No pneumothorax on post-procedure chest x-ray. Procedure performed by: Corrin Parker, PA-C Electronically Signed   By: Gilmer Mor D.O.   On: 04/24/2023 16:45   [place] EKG SITE RITE Result Date: 04/23/2023 If Site Rite image not attached, placement could not be confirmed due to current cardiac rhythm.  CT ABDOMEN PELVIS W CONTRAST Result Date: 04/22/2023 CLINICAL DATA:  Right lower quadrant abdominal pain. EXAM: CT ABDOMEN AND PELVIS WITH CONTRAST TECHNIQUE: Multidetector CT imaging of the abdomen and pelvis was performed using the standard protocol following bolus administration of intravenous contrast. RADIATION DOSE REDUCTION: This exam was performed according to the departmental dose-optimization program which includes automated exposure control, adjustment of the mA and/or kV according to patient size and/or use of iterative reconstruction technique. CONTRAST:   75mL OMNIPAQUE IOHEXOL 300 MG/ML  SOLN COMPARISON:  April 13, 2023. FINDINGS: Lower chest: Large left pleural effusion is noted with small right pleural effusion, with adjacent atelectasis of the lower lobes. Hepatobiliary: No focal liver abnormality is seen. No gallstones, gallbladder wall thickening, or biliary dilatation. Pancreas: Unremarkable. No pancreatic ductal dilatation or surrounding inflammatory changes. Spleen: Normal in size without focal abnormality. Adrenals/Urinary Tract: Adrenal glands and kidneys appear normal. No hydronephrosis or renal obstruction is noted. Urinary bladder is displaced to the left by moderate size right-sided pelvic hematoma which appears to be slightly enlarged compared to prior exam. Stomach/Bowel: The stomach is unremarkable. There is no evidence of bowel obstruction or inflammation. Status post appendectomy. Vascular/Lymphatic: Aortic atherosclerosis. No enlarged abdominal or pelvic lymph nodes. Reproductive: Status post hysterectomy. No adnexal masses. Other: As noted above, moderate size right-sided retroperitoneal hematoma is noted which extends into right pelvic region, where it is mildly enlarged. Subcutaneous gas is noted in the right lower quadrant suggesting postsurgical change. Musculoskeletal: No acute or significant osseous findings. IMPRESSION: Moderate sized right retroperitoneal hematoma is noted which extends into right pelvic region and displaces urinary bladder to the left. The pelvic component appears to be mildly enlarged compared to prior exam. Bilateral pleural effusions are noted, left greater than right, with associated atelectasis of the lower lobes. Electronically Signed   By: Zenda Alpers.D.  On: 04/22/2023 17:45   DG Chest Port 1 View Result Date: 04/22/2023 CLINICAL DATA:  Possible wound infection.  Hypotension. EXAM: PORTABLE CHEST 1 VIEW COMPARISON:  April 16, 2023. FINDINGS: Interval development of moderate size left pleural  effusion with probable underlying atelectasis or pneumonia. Right lung is clear. IMPRESSION: Interval development of moderate size left pleural effusion with probable underlying atelectasis or infiltrate. Electronically Signed   By: Lupita Raider M.D.   On: 04/22/2023 16:49   US Venous Img Lower Unilateral Right (DVT) Result Date: 04/18/2023 CLINICAL DATA:  Lower extremity edema EXAM: RIGHT LOWER EXTREMITY VENOUS DOPPLER ULTRASOUND TECHNIQUE: Gray-scale sonography with compression, as well as color and duplex ultrasound, were performed to evaluate the deep venous system(s) from the level of the common femoral vein through the popliteal and proximal calf veins. COMPARISON:  None Available. FINDINGS: VENOUS The common femoral vein and central profundus femoral vein is obscured by overlying medical device. Normal compressibility of the visualized femoral and popliteal veins, as well as the visualized calf veins. No filling defects to suggest DVT on grayscale or color Doppler imaging. Doppler waveforms show normal direction of venous flow, normal respiratory plasticity and response to augmentation. Limited views of the contralateral common femoral vein are unremarkable. OTHER 4.4 cm fluid collection within the popliteal fossa noted in keeping with a Baker cyst. Limitations: none IMPRESSION: 1. Limited evaluation of the right common femoral and central profundus femoral veins. No evidence of femoropopliteal DVT or calf DVT within the visualized right lower extremity. 2. 4.4 cm Baker cyst. Electronically Signed   By: Helyn Numbers M.D.   On: 04/18/2023 20:55   US Venous Img Upper Uni Right(DVT) Result Date: 04/16/2023 CLINICAL DATA:  Right upper extremity swelling for 3 days EXAM: RIGHT UPPER EXTREMITY VENOUS DOPPLER ULTRASOUND TECHNIQUE: Gray-scale sonography with graded compression, as well as color Doppler and duplex ultrasound were performed to evaluate the upper extremity deep venous system from the level  of the subclavian vein and including the jugular, axillary, basilic, radial, ulnar and upper cephalic vein. Spectral Doppler was utilized to evaluate flow at rest and with distal augmentation maneuvers. COMPARISON:  None Available. FINDINGS: Contralateral Subclavian Vein: Respiratory phasicity is normal and symmetric with the symptomatic side. No evidence of thrombus. Normal compressibility. Internal Jugular Vein: No evidence of thrombus. Normal compressibility, respiratory phasicity and response to augmentation. Subclavian Vein: No evidence of thrombus. Normal compressibility, respiratory phasicity and response to augmentation. Axillary Vein: No evidence of thrombus. Normal compressibility, respiratory phasicity and response to augmentation. Cephalic Vein: Occlusive superficial thrombophlebitis of the mid to distal right cephalic vein. Proximal right cephalic vein is patent and compressible and without thrombus. Basilic Vein: No evidence of thrombus. Normal compressibility, respiratory phasicity and response to augmentation. Brachial Veins: No evidence of thrombus. Normal compressibility, respiratory phasicity and response to augmentation. Radial Veins: No evidence of thrombus. Normal compressibility, respiratory phasicity and response to augmentation. Ulnar Veins: No evidence of thrombus. Normal compressibility, respiratory phasicity and response to augmentation. Venous Reflux:  None visualized. Other Findings: Right internal jugular central venous catheter seen within the right internal jugular vein without surrounding thrombus. IMPRESSION: 1. Occlusive superficial thrombophlebitis of the mid to distal right cephalic vein. 2. No evidence of deep venous thrombosis in the right upper extremity. Electronically Signed   By: Delbert Phenix M.D.   On: 04/16/2023 13:47   DG Chest Port 1 View Result Date: 04/16/2023 CLINICAL DATA:  77 year old female with history of pleural effusions. EXAM: PORTABLE CHEST 1 VIEW  COMPARISON:  Chest x-ray 04/15/2023. FINDINGS: Patient has been extubated. Nasogastric tube seen on the prior study has been removed. Right internal jugular central venous catheter with tip terminating at the superior cavoatrial junction. Opacity at the left base which may reflect atelectasis and/or consolidation. Small left pleural effusion. Right lung appears clear. No definite right pleural effusion. No pneumothorax. No evidence of pulmonary edema. Heart size is normal. Upper mediastinal contours are within normal limits. IMPRESSION: 1. Support apparatus, as above. 2. Atelectasis and/or consolidation in the left lung base with small left pleural effusion. Electronically Signed   By: Trudie Reed M.D.   On: 04/16/2023 06:56   US RENAL Result Date: 04/15/2023 CLINICAL DATA:  Acute renal failure. EXAM: RENAL / URINARY TRACT ULTRASOUND COMPLETE COMPARISON:  CT abdomen pelvis dated April 13, 2023. FINDINGS: Right Kidney: Renal measurements: 7.5 x 4.3 x 4.1 cm = volume: 69 mL. Echogenicity within normal limits. No mass or hydronephrosis visualized. Left Kidney: Renal measurements: 7.7 x 4.2 x 3.6 cm = volume: 61 mL. Echogenicity within normal limits. No mass or hydronephrosis visualized. Bladder: Decompressed by Foley catheter. Other: Bilateral pleural effusions. Small amount of fluid in the upper abdomen. IMPRESSION: 1. No acute abnormality.  No hydronephrosis. Electronically Signed   By: Obie Dredge M.D.   On: 04/15/2023 18:02   DG Abd 1 View Result Date: 04/15/2023 CLINICAL DATA:  Right retroperitoneal hematoma. EXAM: ABDOMEN - 1 VIEW COMPARISON:  04/14/2023 FINDINGS: No gaseous bowel dilatation. Foley catheter noted in the urinary bladder. Persistent contrast retention in the kidneys. Skin staples overlie the right groin region. IMPRESSION: 1. Nonobstructive bowel gas pattern. 2. Persistent contrast retention in the kidneys. Electronically Signed   By: Kennith Center M.D.   On: 04/15/2023 14:11    DG Chest Port 1 View Result Date: 04/15/2023 CLINICAL DATA:  Endotracheal tube positioning/placement. EXAM: PORTABLE CHEST 1 VIEW COMPARISON:  Chest radiograph dated 04/14/2023. FINDINGS: The heart size and mediastinal contours are within normal limits. There is mild bibasilar atelectasis/airspace disease. No pneumothorax. An endotracheal tube terminates in the midthoracic trachea. An enteric tube enters the stomach and terminates below the field of view. IMPRESSION: Mild bibasilar atelectasis/airspace disease. Electronically Signed   By: Romona Curls M.D.   On: 04/15/2023 13:56   ECHOCARDIOGRAM COMPLETE BUBBLE STUDY Result Date: 04/14/2023    ECHOCARDIOGRAM REPORT   Patient Name:   Dawn Bruce Date of Exam: 04/14/2023 Medical Rec #:  [id number]   Height:       63.0 in Accession #:    [id number]  Weight:       128.0 lb Date of Birth:  12-25-1945  BSA:          1.600 m Patient Age:    33 years    BP:           92/63 mmHg Patient Gender: F           HR:           86 bpm. Exam Location:  [facility] Procedure: 2D Echo, Cardiac Doppler, Color Doppler and Saline Contrast Bubble            Study Indications:     CHF (congestive heart failure) (HCC) [[id number]]  History:         Patient has prior history of Echocardiogram examinations. Risk                  Factors:Hypertension.  Sonographer:     Neysa Bonito Roar Referring Phys:  [id number] CARALYN HUDSON  Diagnosing Phys: Marcina Millard MD IMPRESSIONS  1. Left ventricular ejection fraction, by estimation, is 25 to 30%. The left ventricle has severely decreased function. The left ventricle demonstrates regional wall motion abnormalities (see scoring diagram/findings for description). Left ventricular diastolic parameters were normal.  2. Right ventricular systolic function is normal. The right ventricular size is normal.  3. The mitral valve is normal in structure. Mild mitral valve regurgitation. No evidence of mitral stenosis.  4. The aortic valve is normal in structure.  Aortic valve regurgitation is not visualized. No aortic stenosis is present.  5. The inferior vena cava is normal in size with greater than 50% respiratory variability, suggesting right atrial pressure of 3 mmHg. FINDINGS  Left Ventricle: Left ventricular ejection fraction, by estimation, is 25 to 30%. The left ventricle has severely decreased function. The left ventricle demonstrates regional wall motion abnormalities. The left ventricular internal cavity size was normal  in size. There is no left ventricular hypertrophy. Left ventricular diastolic parameters were normal.  LV Wall Scoring: The apical lateral segment, apical septal segment, and apex are akinetic. The apical anterior segment and apical inferior segment are hypokinetic. Right Ventricle: The right ventricular size is normal. No increase in right ventricular wall thickness. Right ventricular systolic function is normal. Left Atrium: Left atrial size was normal in size. Right Atrium: Right atrial size was normal in size. Pericardium: Trivial pericardial effusion is present. Mitral Valve: The mitral valve is normal in structure. Mild mitral valve regurgitation. No evidence of mitral valve stenosis. MV peak gradient, 5.5 mmHg. The mean mitral valve gradient is 3.0 mmHg. Tricuspid Valve: The tricuspid valve is normal in structure. Tricuspid valve regurgitation is not demonstrated. No evidence of tricuspid stenosis. Aortic Valve: The aortic valve is normal in structure. Aortic valve regurgitation is not visualized. No aortic stenosis is present. Aortic valve mean gradient measures 3.0 mmHg. Aortic valve peak gradient measures 4.6 mmHg. Aortic valve area, by VTI measures 1.14 cm. Pulmonic Valve: The pulmonic valve was normal in structure. Pulmonic valve regurgitation is not visualized. No evidence of pulmonic stenosis. Aorta: The aortic root is normal in size and structure. Venous: The inferior vena cava is normal in size with greater than 50% respiratory  variability, suggesting right atrial pressure of 3 mmHg. IAS/Shunts: No atrial level shunt detected by color flow Doppler. Agitated saline contrast was given intravenously to evaluate for intracardiac shunting. Additional Comments: There is a small pleural effusion in the left lateral region.  LEFT VENTRICLE PLAX 2D LVIDd:         3.90 cm     Diastology LVIDs:         3.40 cm     LV e' medial:    2.84 cm/s LV PW:         1.00 cm     LV E/e' medial:  28.2 LV IVS:        1.10 cm     LV e' lateral:   4.95 cm/s LVOT diam:     1.40 cm     LV E/e' lateral: 16.2 LV SV:         23 LV SV Index:   14 LVOT Area:     1.54 cm  LV Volumes (MOD) LV vol d, MOD A2C: 48.3 ml LV vol d, MOD A4C: 39.9 ml LV vol s, MOD A2C: 35.0 ml LV vol s, MOD A4C: 28.3 ml LV SV MOD A2C:     13.3 ml LV SV MOD A4C:  39.9 ml LV SV MOD BP:      12.4 ml RIGHT VENTRICLE RV Basal diam:  2.40 cm RV Mid diam:    2.20 cm RV S prime:     6.97 cm/s TAPSE (M-mode): 0.9 cm LEFT ATRIUM             Index        RIGHT ATRIUM          Index LA diam:        3.10 cm 1.94 cm/m   RA Area:     7.55 cm LA Vol (A2C):   39.3 ml 24.57 ml/m  RA Volume:   12.20 ml 7.63 ml/m LA Vol (A4C):   31.6 ml 19.76 ml/m LA Biplane Vol: 35.1 ml 21.94 ml/m  AORTIC VALVE                    PULMONIC VALVE AV Area (Vmax):    1.34 cm     PV Vmax:        0.85 m/s AV Area (Vmean):   1.26 cm     PV Peak grad:   2.9 mmHg AV Area (VTI):     1.14 cm     RVOT Peak grad: 2 mmHg AV Vmax:           107.00 cm/s AV Vmean:          74.200 cm/s AV VTI:            0.203 m AV Peak Grad:      4.6 mmHg AV Mean Grad:      3.0 mmHg LVOT Vmax:         93.40 cm/s LVOT Vmean:        60.600 cm/s LVOT VTI:          0.150 m LVOT/AV VTI ratio: 0.74  AORTA Ao Root diam: 1.90 cm Ao Asc diam:  2.30 cm MITRAL VALVE                TRICUSPID VALVE MV Area (PHT): 5.84 cm     TR Peak grad:   23.6 mmHg MV Area VTI:   0.95 cm     TR Vmax:        243.00 cm/s MV Peak grad:  5.5 mmHg MV Mean grad:  3.0 mmHg     SHUNTS MV  Vmax:       1.17 m/s     Systemic VTI:  0.15 m MV Vmean:      80.4 cm/s    Systemic Diam: 1.40 cm MV Decel Time: 130 msec MV E velocity: 80.20 cm/s MV A velocity: 105.00 cm/s MV E/A ratio:  0.76 MV A Prime:    7.1 cm/s Marcina Millard MD Electronically signed by Marcina Millard MD Signature Date/Time: 04/14/2023/2:42:41 PM    Final    DG Abd 1 View Result Date: 04/14/2023 CLINICAL DATA:  Orogastric tube placement EXAM: ABDOMEN - 1 VIEW COMPARISON:  None Available. FINDINGS: Orogastric tube tip seen within the proximal body of the stomach with the proximal side hole in the region of the gastroesophageal junction. Nonobstructive bowel gas pattern. No gross free intraperitoneal gas. No organomegaly. Retained contrast opacifies the renal cortices bilaterally related to CT arteriogram performed 04/13/2023. Degenerative changes noted within the lumbar spine. IMPRESSION: 1. Orogastric tube tip within the proximal body of the stomach. Advancement of the catheter by 5-10 cm may more optimally position the device. Electronically Signed   By: Lyda Kalata.D.  On: 04/14/2023 02:28   DG Chest Port 1 View Result Date: 04/14/2023 CLINICAL DATA:  Chest tube placement EXAM: PORTABLE CHEST 1 VIEW COMPARISON:  04/13/2023 FINDINGS: Endotracheal tube is seen 2.2 cm above the carina. Nasogastric tube extends into the upper abdomen beyond margin of the examination. Right internal jugular central venous catheter is in place with its tip at the superior cavoatrial junction. Lung volumes are small with mild elevation of the right hemidiaphragm. Retrocardiac opacity persists in keeping with atelectasis or infiltrate within this region. Lungs are otherwise clear. No pneumothorax or pleural effusion. Cardiac size within normal limits. Pulmonary vascularity is normal. No acute bone abnormality. No chest tube identified within the visualized thorax. IMPRESSION: 1. Support tubes in appropriate position. 2. No chest tube  identified within the visualized thorax. No pneumothorax. 3. Persistent retrocardiac atelectasis or infiltrate. Electronically Signed   By: Helyn Numbers M.D.   On: 04/14/2023 02:26   CARDIAC CATHETERIZATION Result Date: 04/13/2023   Mid LAD lesion is 30% stenosed.   Ost LAD to Prox LAD lesion is 99% stenosed.   A drug-eluting stent was successfully placed using a STENT ONYX FRONTIER 3.5X12.   Post intervention, there is a 0% residual stenosis.   There is mild left ventricular systolic dysfunction.   The left ventricular ejection fraction is 45-50% by visual estimate. 1.  High-grade in-stent restenosis ostial LAD 2.  Successful PCI with 3.5 x 12 mm Onyx frontier DES 3.  Right groin hematoma Recommendations 1.  Dual antiplatelet therapy uninterrupted 1 year 2.  Abdominal CT to rule out retroperitoneal bleed 3.  Further recommendations pending abdominal CT results   CT Angio Abd/Pel w/ and/or w/o Result Date: 04/13/2023 CLINICAL DATA:  Suspected retroperitoneal hemorrhage from cath lab EXAM: CTA ABDOMEN AND PELVIS WITHOUT AND WITH CONTRAST TECHNIQUE: Multidetector CT imaging of the abdomen and pelvis was performed using the standard protocol during bolus administration of intravenous contrast. Multiplanar reconstructed images and MIPs were obtained and reviewed to evaluate the vascular anatomy. RADIATION DOSE REDUCTION: This exam was performed according to the departmental dose-optimization program which includes automated exposure control, adjustment of the mA and/or kV according to patient size and/or use of iterative reconstruction technique. CONTRAST:  80mL OMNIPAQUE IOHEXOL 350 MG/ML SOLN COMPARISON:  CT abdomen pelvis, 12/13/2021 FINDINGS: VASCULAR Large right-sided retroperitoneal hematoma arising in the vicinity of the right external iliac artery and extending into the right paracolic gutter, dimensions at least 18.6 x 7.7 x 6.2 cm (series 15, image 44, series 10, image 139). Brisk arterial  extravasation arising in the vicinity of the right inferior epigastric artery origin and distal portion of the right external iliac artery (series 10, image 161, series 15, image 44). Normal contour and caliber of the abdominal aorta. No evidence of aortic aneurysm, dissection, or other acute aortic pathology. 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 Moderate mixed calcific atherosclerosis. Review of the MIP images confirms the above findings. NON-VASCULAR Lower Chest: Moderate left, small right pleural effusions and associated atelectasis or consolidation. Bilateral breast implants small hiatal hernia. Hepatobiliary: No solid liver abnormality is seen. No gallstones, gallbladder wall thickening, or biliary dilatation. Pancreas: Unremarkable. No pancreatic ductal dilatation or surrounding inflammatory changes. Spleen: Normal in size without significant abnormality. Adrenals/Urinary Tract: Adrenal glands are unremarkable. Extensive, lobulated bilateral renal cortical scarring. No calculi or hydronephrosis. Excreted contrast within the collecting systems and bladder following catheterization. Foley catheter in the bladder. Stomach/Bowel: Stomach is within  normal limits. Appendix appears normal. No evidence of bowel wall thickening, distention, or inflammatory changes. Sigmoid diverticula. Lymphatic: No enlarged abdominal or pelvic lymph nodes. Reproductive: No mass or other significant abnormality. Other: No abdominal wall hernia or abnormality. Small volume hemoperitoneum about the liver and in the pelvis (series 10, image 156, series 5, image 17). Musculoskeletal: No acute osseous findings. IMPRESSION: 1. Large right-sided retroperitoneal hematoma arising in the vicinity of the right external iliac artery and extending into the right paracolic gutter, dimensions at least 18.6 x 7.7 x 6.2 cm. 2. Brisk arterial extravasation  arising in the vicinity of the right inferior epigastric artery origin and distal portion of the right external iliac artery. 3. Small volume hemoperitoneum about the liver and in the pelvis. 4. Moderate left, small right pleural effusions and associated atelectasis or consolidation. These results were called by telephone at the time of interpretation on 04/13/2023 at 9:25 pm to [facility] PARASCHOS , who verbally acknowledged these results. Aortic Atherosclerosis (ICD10-I70.0). Electronically Signed   By: Jearld Lesch M.D.   On: 04/13/2023 21:38   US THORACENTESIS ASP PLEURAL SPACE W/IMG GUIDE Result Date: 04/13/2023 INDICATION: Patient with history of CAD s/p stenting, admitted with complaints of new chest pain and found to have large left pleural effusion. Request for diagnostic and therapeutic left thoracentesis. EXAM: ULTRASOUND GUIDED LEFT THORACENTESIS MEDICATIONS: 15 mL 1% lidocaine COMPLICATIONS: None immediate. PROCEDURE: An ultrasound guided thoracentesis was thoroughly discussed with the patient and questions answered. The benefits, risks, alternatives and complications were also discussed. The patient understands and wishes to proceed with the procedure. Written consent was obtained. Ultrasound was performed to localize and mark an adequate pocket of fluid in the left chest. The area was then prepped and draped in the normal sterile fashion. 1% Lidocaine was used for local anesthesia. Under ultrasound guidance a 6 Fr Safe-T-Centesis catheter was introduced. Thoracentesis was performed. The catheter was removed and a dressing applied. FINDINGS: A total of approximately 650 mL of clear yellow fluid was removed. Samples were sent to the laboratory as requested by the clinical team. IMPRESSION: Successful ultrasound guided left thoracentesis yielding 650 mL of pleural fluid. Performed by Lynnette Caffey, PA-C Electronically Signed   By: Olive Bass M.D.   On: 04/13/2023 14:13   DG Chest Port 1  View Result Date: 04/13/2023 CLINICAL DATA:  [id number] Pleural effusion on left [id number]. Status post thoracentesis. EXAM: PORTABLE CHEST 1 VIEW COMPARISON:  04/12/2023. FINDINGS: There is small left pleural effusion with probable associated compressive atelectatic changes in the left lung. There is interval improvement when compared to the prior exam, status post left thoracentesis. No left pneumothorax. Biapical pleural thickening/calcifications again seen. Bilateral lung fields are otherwise clear. There is subtle blunting of right lateral costophrenic angle, which may represent trace right pleural effusion. Normal cardio-mediastinal silhouette. No acute osseous abnormalities. The soft tissues are within normal limits. IMPRESSION: *Small left pleural effusion, improved since the prior study, status post left thoracentesis. No pneumothorax. Electronically Signed   By: Jules Schick M.D.   On: 04/13/2023 12:58   CT Angio Chest Pulmonary Embolism (PE) W or WO Contrast Result Date: 04/12/2023 CLINICAL DATA:  Chest pain for 1 hour, hypertension EXAM: CT ANGIOGRAPHY CHEST WITH CONTRAST TECHNIQUE: Multidetector CT imaging of the chest was performed using the standard protocol during bolus administration of intravenous contrast. Multiplanar CT image reconstructions and MIPs were obtained to evaluate the vascular anatomy. RADIATION DOSE REDUCTION: This exam was performed according to the departmental dose-optimization program which  includes automated exposure control, adjustment of the mA and/or kV according to patient size and/or use of iterative reconstruction technique. CONTRAST:  60mL OMNIPAQUE IOHEXOL 350 MG/ML SOLN COMPARISON:  12/28/2020, 04/12/2023 FINDINGS: Cardiovascular: This is a technically adequate evaluation of the pulmonary vasculature. No filling defects or pulmonary emboli. The heart is unremarkable without pericardial effusion. Normal caliber of the thoracic aorta. Atherosclerosis of the aorta and  coronary vasculature. Mediastinum/Nodes: No enlarged mediastinal, hilar, or axillary lymph nodes. Thyroid gland, trachea, and esophagus demonstrate no significant findings. Lungs/Pleura: Large left pleural effusion, volume estimated in excess of 1 L. dependent consolidation within the left lower lobe most consistent with atelectasis. There is a trace right pleural effusion. No airspace disease or pneumothorax. Bilateral bronchial wall thickening, greatest in the lower lobes. Upper Abdomen: No acute abnormality. Musculoskeletal: No acute or destructive bony abnormalities. Reconstructed images demonstrate no additional findings. Review of the MIP images confirms the above findings. IMPRESSION: 1. No evidence of pulmonary embolus. 2. Large left pleural effusion, volume estimated in excess of 1 L. Dependent left lower lobe atelectasis. 3. Trace right pleural effusion. 4. Bilateral bronchial wall thickening, greatest in the lower lobes, which may reflect bronchitis or reactive airway disease. No evidence of pneumonia. 5. Aortic Atherosclerosis (ICD10-I70.0). Coronary artery atherosclerosis. Electronically Signed   By: Sharlet Salina M.D.   On: 04/12/2023 23:46   DG Chest 2 View Result Date: 04/12/2023 CLINICAL DATA:  Chest pain.  Hypertension. EXAM: CHEST - 2 VIEW COMPARISON:  10/06/2021 FINDINGS: Midline trachea. Mild cardiomegaly. Increase in small to moderate left pleural effusion. Biapical pleural thickening. No pneumothorax. No congestive failure. Worsened left lower lobe airspace disease. IMPRESSION: Increase in small to moderate left pleural effusion. Progressive adjacent left base airspace disease which could represent atelectasis or infection. Cardiomegaly without congestive failure. Electronically Signed   By: Jeronimo Greaves M.D.   On: 04/12/2023 16:58    EKG: Atrial fibrillation rate controlled 80 nonspecific ST-T wave changes diffuse T wave inversions  ASSESSMENT AND PLAN:  Right groin  cellulitis Hospitalist retroperitoneal hematoma Acute non-STEMI Status post PCI and stent of ostial LAD DES Chronic diastolic heart failure Acute on chronic renal insufficiency Atrial fibrillation Coronary artery disease Thrombocytopenia Preop right groin wound debridement . Plan Agree with therapy for right groin cellulitis with wound debridement Continue broad-spectrum antibiotic therapy Do not recommend discontinuing the platelet therapy for thrombocytopenia unless the patient starts bleeding he Continue Eliquis for atrial fibrillation but would be in favor of stopping Eliquis before stopping antiplatelet recommend heparin in favor of Eliquis as inpatient Continue Plavix unless active bleeding patient has high risk for acute in-stent thrombosis because of location Maintain adequate hydration with transient hypotension and renal insufficiency Maintain a conservative cardiac input at this stage  Signed: Alwyn Pea MD,  05/06/2023, 1:18 PM

## 2023-05-06 NOTE — Progress Notes (Signed)
PHARMACY NOTE:  ANTIMICROBIAL RENAL DOSAGE ADJUSTMENT  Current antimicrobial regimen includes a mismatch between antimicrobial dosage and estimated renal function.  As per policy approved by the Pharmacy & Therapeutics and Medical Executive Committees, the antimicrobial dosage will be adjusted accordingly.  Current antimicrobial dosage:  Augmentin 500mg  bid  Indication: wound infection  Renal Function:  Estimated Creatinine Clearance: 9.5 mL/min (A) (by C-G formula based on SCr of 4.67 mg/dL (H)). []      On intermittent HD, scheduled: []      On CRRT    Antimicrobial dosage has been changed to:  augmentin 500mg  daily  Additional comments:   Thank you for allowing pharmacy to be a part of this patient's care.  Foye Deer, [facility] 05/06/2023 8:22 PM

## 2023-05-06 NOTE — Plan of Care (Signed)

## 2023-05-06 NOTE — Progress Notes (Addendum)
Mobility Specialist - Progress Note      05/06/23 1304  Mobility  Activity Ambulated with assistance in hallway;Stood at bedside  Level of Assistance Standby assist, set-up cues, supervision of patient - no hands on  Assistive Device Front wheel walker  Distance Ambulated (ft) 170 ft  Range of Motion/Exercises Active  Activity Response Tolerated well  Mobility Referral Yes  Mobility visit 1 Mobility   Pt resting in bed on RA upon entry. Pt STS and ambulates to hallway around NS SBA with RW. Pt maintains steady uprighto position throughout session and no LOB or SOB reported. Pt returned to bsc for 5-6 minutes and returned to bed and left with needs in reach; bed alarm activated.

## 2023-05-07 ENCOUNTER — Inpatient Hospital Stay: Payer: Medicare Other

## 2023-05-07 DIAGNOSIS — D696 Thrombocytopenia, unspecified: Secondary | ICD-10-CM | POA: Diagnosis not present

## 2023-05-07 DIAGNOSIS — L03311 Cellulitis of abdominal wall: Secondary | ICD-10-CM | POA: Diagnosis not present

## 2023-05-07 DIAGNOSIS — S31109A Unspecified open wound of abdominal wall, unspecified quadrant without penetration into peritoneal cavity, initial encounter: Secondary | ICD-10-CM | POA: Diagnosis not present

## 2023-05-07 DIAGNOSIS — L03314 Cellulitis of groin: Secondary | ICD-10-CM | POA: Diagnosis not present

## 2023-05-07 DIAGNOSIS — Z9889 Other specified postprocedural states: Secondary | ICD-10-CM

## 2023-05-07 DIAGNOSIS — J90 Pleural effusion, not elsewhere classified: Secondary | ICD-10-CM | POA: Diagnosis not present

## 2023-05-07 DIAGNOSIS — T8130XA Disruption of wound, unspecified, initial encounter: Secondary | ICD-10-CM | POA: Diagnosis not present

## 2023-05-07 LAB — BASIC METABOLIC PANEL
Anion gap: 8 (ref 5–15)
BUN: 87 mg/dL — ABNORMAL HIGH (ref 8–23)
CO2: 25 mmol/L (ref 22–32)
Calcium: 8.5 mg/dL — ABNORMAL LOW (ref 8.9–10.3)
Chloride: 105 mmol/L (ref 98–111)
Creatinine, Ser: 4.46 mg/dL — ABNORMAL HIGH (ref 0.44–1.00)
GFR, Estimated: 10 mL/min — ABNORMAL LOW (ref >60)
Glucose, Bld: 107 mg/dL — ABNORMAL HIGH (ref 70–99)
Potassium: 4.4 mmol/L (ref 3.5–5.1)
Sodium: 138 mmol/L (ref 135–145)

## 2023-05-07 LAB — CBC
HCT: 27.6 % — ABNORMAL LOW (ref 36.0–46.0)
Hemoglobin: 9 g/dL — ABNORMAL LOW (ref 12.0–15.0)
MCH: 28.4 pg (ref 26.0–34.0)
MCHC: 32.6 g/dL (ref 30.0–36.0)
MCV: 87.1 fL (ref 80.0–100.0)
Platelets: 39 10*3/uL — ABNORMAL LOW (ref 150–400)
RBC: 3.17 MIL/uL — ABNORMAL LOW (ref 3.87–5.11)
RDW: 15.7 % — ABNORMAL HIGH (ref 11.5–15.5)
WBC: 6.3 10*3/uL (ref 4.0–10.5)
nRBC: 0 % (ref 0.0–0.2)

## 2023-05-07 LAB — CK: Total CK: 39 U/L (ref 38–234)

## 2023-05-07 LAB — HAPTOGLOBIN: Haptoglobin: 71 mg/dL (ref 42–346)

## 2023-05-07 LAB — PATHOLOGIST SMEAR REVIEW

## 2023-05-07 LAB — HOMOCYSTEINE: Homocysteine: 10.6 umol/L (ref 0.0–19.2)

## 2023-05-07 MED ORDER — CARVEDILOL 6.25 MG PO TABS: 3.1250 mg | ORAL_TABLET | Freq: Two times a day (BID) | ORAL | Status: DC

## 2023-05-07 MED ORDER — HYDROMORPHONE HCL 1 MG/ML IJ SOLN: 1.0000 mg | Freq: Once | INTRAMUSCULAR | Status: DC

## 2023-05-07 MED ORDER — HYDROMORPHONE HCL 1 MG/ML IJ SOLN
1.0000 mg | INTRAMUSCULAR | Status: AC
  Administered 2023-05-07: 1 mg via INTRAVENOUS
  Filled 2023-05-07: qty 1

## 2023-05-07 MED ORDER — CARVEDILOL 6.25 MG PO TABS
6.2500 mg | ORAL_TABLET | Freq: Two times a day (BID) | ORAL | Status: DC
  Administered 2023-05-07 – 2023-05-08 (×3): 6.25 mg via ORAL
  Filled 2023-05-07 (×3): qty 1

## 2023-05-07 MED ORDER — LIDOCAINE HCL (PF) 1 % IJ SOLN
10.0000 mL | Freq: Once | INTRAMUSCULAR | Status: AC
  Administered 2023-05-07: 10 mL via INTRADERMAL

## 2023-05-07 MED ORDER — DAPTOMYCIN 500 MG IV SOLR
8.0000 mg/kg | INTRAVENOUS | Status: DC
  Administered 2023-05-07 – 2023-05-09 (×2): 600 mg via INTRAVENOUS
  Filled 2023-05-07 (×2): qty 12

## 2023-05-07 MED ORDER — GUAIFENESIN-DM 100-10 MG/5ML PO SYRP
5.0000 mL | ORAL_SOLUTION | Freq: Once | ORAL | Status: AC
  Administered 2023-05-07: 5 mL via ORAL
  Filled 2023-05-07: qty 10

## 2023-05-07 MED ORDER — ALBUMIN HUMAN 25 % IV SOLN
25.0000 g | Freq: Once | INTRAVENOUS | Status: AC
  Administered 2023-05-07: 25 g via INTRAVENOUS
  Filled 2023-05-07: qty 100

## 2023-05-07 NOTE — Progress Notes (Signed)
Patient on Asprin+ plavix+ eliquis 2.5 mg BID/renal failure. Platelet counts- 39; no bleeding noted- likely from anti-biotics/infection- recent zosyn. ? Linzolid. Recommend alternative anti-biotics. Discussed with primary team/ID/nephrology.  Will rule out DIC- clinically less likely.   Discussed with patient's husband/and daughter by the bed side.

## 2023-05-07 NOTE — Progress Notes (Addendum)
Pharmacy Antibiotic Note  Dawn Bruce is a 77 y.o. female admitted on 04/22/2023 with groin wound.  Pharmacy has been consulted for daptomycin dosing. Patient on several antibiotics.  Recently she has developed AKI and thromboyctopenia  vancomycin 12/1 >> 12/2 ceftriaxone 12/1 >> 12/2 cefepime 12/2 >>12/9  linezolid 12/2 >>12/9 Pip/tazo 12/9 >>12/15 Augmentin 12/16 1 dose Cipro 12/16>> Daptomycin 12/16 >>  Today, 05/07/2023 Day #15 antibiotics, status post 7 days of piperacillin/tazobactam Renal: SCr 4.46 - mild improvement today WBC WNL Afebrile 12/4 culture from OR: E faecalis  12/2 wound cx: E coli, M morganii, P mirabilis Thrombocytopenia of unknown etiology s/p recent linezolid (and suspect drug levels lingered with AKI) and oncology like to avoid piperacillin/tazobactam and other penicillins for time being.      Plan: Continue ciprofloxacin to cover gram negatives and add daptomycin for time being to cover enterococcus (avoid vanco and linezolid).  Daptomycin 600mg  IV q48h (8mg /kg) On atorvastatin 40mg  Add CK to morning labs Weekly CK Follow labs - renal function, platelets, CK  Height: 5' 2.99" (160 cm) Weight: 75.8 kg (167 lb 1.7 oz) IBW/kg (Calculated) : 52.38  Temp (24hrs), Avg:98 F (36.7 C), Min:97.7 F (36.5 C), Max:98.2 F (36.8 C)  Recent Labs  Lab 05/03/23 0659 05/04/23 0530 05/05/23 0525 05/06/23 0509 05/06/23 1338 05/07/23 0540  WBC 7.8 6.8  --  6.3 7.2 6.3  CREATININE 3.95* 4.56* 4.67* 4.67*  --  4.46*    Estimated Creatinine Clearance: 10 mL/min (A) (by C-G formula based on SCr of 4.46 mg/dL (H)).    No Known Allergies   Thank you for allowing pharmacy to be a part of this patient's care.  Juliette Alcide, PharmD, BCPS, BCIDP Work Cell: [phone] 05/07/2023 3:33 PM

## 2023-05-07 NOTE — Progress Notes (Signed)
       CROSS COVER NOTE  NAME: Metzli Bunten MRN: [id number] DOB : Jul 05, 1945    Date of Service   05/07/2023   HPI/Events of Note   Nurse paged because patient was complaining of worsening shortness of breath and frequent coughs.  Chart review showed that patient had a thoracentesis performed a few hours prior to this and had 1600 mL of fluids taken off.  Patient had a post procedural CXR that showed new worsening right-sided pleural effusions as well.  On assessment at bedside, patient is interactive and able to answer questions.  Auscultation of the lungs revealed fine crackles in right lung.  Interventions   -Patient given guaifenesin for cough suppression. -Patient placed on 2 L of nasal cannula for support. -Patient given a breathing treatment by RT at bedside.      Hussam Muniz Lamin Geradine Girt, MSN, APRN, AGACNP-BC [facility] [place] Pager: [phone]. Check Amion for Availability

## 2023-05-07 NOTE — TOC Progression Note (Signed)
Transition of Care [facility]) - Progression Note    Patient Details  Name: Dawn Bruce MRN: [id number] Date of Birth: Sep 14, 1945  [facility] of [facility]) CM/SW Contact  Chapman Fitch, RN Phone Number: 05/07/2023, 11:32 AM  Clinical Narrative:     Per MD Due to severe thrombocytopenia, most likely caused by Zosyn.  Antibiotic switched to Cipro and Augmentin on 12/15. Scheduled wound dressing change today.        Expected Discharge Plan and Services                                               Social Determinants of Health (SDOH) Interventions SDOH Screenings   Food Insecurity: No Food Insecurity (04/23/2023)  Housing: Low Risk  (05/03/2023)  Transportation Needs: No Transportation Needs (04/23/2023)  Utilities: Not At Risk (04/23/2023)  Alcohol Screen: Low Risk  (12/08/2022)  Depression (PHQ2-9): Low Risk  (12/08/2022)  Financial Resource Strain: Low Risk  (10/13/2022)  Physical Activity: Insufficiently Active (10/13/2022)  Social Connections: Unknown (10/13/2022)  Stress: No Stress Concern Present (10/13/2022)  Tobacco Use: Medium Risk (04/27/2023)    Readmission Risk Interventions     No data to display

## 2023-05-07 NOTE — Progress Notes (Signed)
Date of Admission:  04/22/2023     ID: Dawn Bruce is a 77 y.o. female  Principal Problem:   Cellulitis of right groin Active Problems:   Chronic pain syndrome   Essential hypertension   HLD (hyperlipidemia)   Chronic diastolic heart failure (HCC)   Sepsis (HCC)   CAD (coronary artery disease)   Chronic kidney disease, stage 3a (HCC)   Peritoneal hematoma   Pleural effusion   Abnormal LFTs   Hypokalemia   Atrial fibrillation, chronic (HCC)   Diarrhea   HFrEF (heart failure with reduced ejection fraction) (HCC)   Severe sepsis (HCC)   Infected wound   Cellulitis of right thigh   Acute kidney injury superimposed on stage 3a chronic kidney disease (HCC)   Metabolic acidosis   Overweight (BMI 25.0-29.9)   Thrombocytopenia (HCC)   Antibiotic 12/2-12/9- cefepime + linezolid 12/9= zosyn  Dawn Bruce is a 77 y.o. female with a history of CAD, HTN, LD was recently in [facility] between 11/21-11/29 for chest pain and found to have STEMI, underwent angio on 11/22 and ostial LADstent placement- complicated by retroperitoneal hematoma, hemorrhagic shock needing  repair of rt external iliac and circumflex artery by Dr.Esco on 11/222/4. She also received PRBC and was in ICU- She was discharge don 04/20/23 and called vascular who prescribed keflex on 11/30. She came back to the ED on 12/1 with infected wound rt groin. Culture sent and was started on Iv vanco and ceftriaxone. On 12/15 IV zosyn changed to PO cipro and augmentin  Subjective: Pt had left thoracentesis today and drained 1.5 L of fluid Feeling better  Medications:   alum & mag hydroxide-simeth  15 mL Oral Once   amoxicillin-clavulanate  1 tablet Oral Daily   apixaban  2.5 mg Oral BID   aspirin EC  81 mg Oral Daily   atorvastatin  40 mg Oral Daily   carvedilol  6.25 mg Oral BID WC   Chlorhexidine Gluconate Cloth  6 each Topical Daily   ciprofloxacin  500 mg Oral Q breakfast   clopidogrel  75 mg Oral Daily   ezetimibe  10 mg  Oral Daily   fentaNYL  1 patch Transdermal Q72H   pregabalin  75 mg Oral Daily   sodium chloride flush  10-40 mL Intracatheter Q12H   traZODone  50 mg Oral QHS    Objective: Vital signs in last 24 hours: Patient Vitals for the past 24 hrs:  BP Temp Temp src Pulse Resp SpO2  05/07/23 0928 (!) 163/86 -- -- (!) 110 16 99 %  05/07/23 0838 (!) 194/103 -- -- (!) 111 18 96 %  05/07/23 0804 -- -- -- -- -- 96 %  05/07/23 0801 (!) 196/111 98.2 F (36.8 C) -- (!) 107 20 95 %  05/07/23 0515 (!) 157/69 98.2 F (36.8 C) Oral (!) 110 20 94 %  05/06/23 2041 (!) 147/76 97.7 F (36.5 C) Oral (!) 104 16 98 %  05/06/23 2012 -- -- -- -- -- 94 %  05/06/23 1729 (!) 149/79 97.8 F (36.6 C) -- (!) 102 18 92 %      PHYSICAL EXAM:  General: awake and alert Chest b/l air entry Decreased let base [id number] Rt groin wound     Extremities: bruising thighs Skin: as above Lymph: Cervical, supraclavicular normal. Neurologic: Grossly non-focal  Lab Results    Latest Ref Rng & Units 05/07/2023    5:40 AM 05/06/2023    1:38 PM 05/06/2023    5:09 AM  CBC  WBC 4.0 - 10.5 K/uL 6.3  7.2  6.3   Hemoglobin 12.0 - 15.0 g/dL 9.0  9.8  9.5   Hematocrit 36.0 - 46.0 % 27.6  29.8  28.9   Platelets 150 - 400 K/uL 39  50  51        Latest Ref Rng & Units 05/07/2023    5:40 AM 05/06/2023    5:09 AM 05/05/2023    5:25 AM  CMP  Glucose 70 - 99 mg/dL 409  811  914   BUN 8 - 23 mg/dL 87  92  91   Creatinine 0.44 - 1.00 mg/dL 7.82  9.56  2.13   Sodium 135 - 145 mmol/L 138  137  137   Potassium 3.5 - 5.1 mmol/L 4.4  4.6  4.6   Chloride 98 - 111 mmol/L 105  105  106   CO2 22 - 32 mmol/L 25  22  21    Calcium 8.9 - 10.3 mg/dL 8.5  8.3  8.3       Microbiology: Wound culture E.coli, proteus morganella Surgical culture enterococcus fecalis  Studies/Results:   DG Chest Port 1 View Result Date: 05/06/2023 CLINICAL DATA:  [id number] Interstitial edema [id number]. EXAM: PORTABLE CHEST 1 VIEW COMPARISON:  Chest  radiograph 05/03/2023. FINDINGS: Unchanged right upper extremity PICC with tip projecting over the low SVC. Similar moderate left pleural effusion and trace right pleural effusion with adjacent atelectasis in the lung bases. No pulmonary edema. Stable cardiac and mediastinal contours. No pneumothorax. IMPRESSION: Similar moderate left pleural effusion and trace right pleural effusion with adjacent atelectasis in the lung bases. Electronically Signed   By: Orvan Falconer M.D.   On: 05/06/2023 16:16     Assessment/Plan: Infected groin wound at the site of surgery with dehiscence of wound and surrounding cellulitis Culture polymicrobial infection ( 3 gram neg rods and enteococcus debridement done .has wound vac On  zosyn day 12 of antibiotic which was DC on 12/15 due to thrombocytopenia Currently  CIPRO+ augmentin, but heme onc wants to avoid any betalactam due to thrombocytopenia Will change augmentin to dapto- Antibiotic until 05/20/23 AKI on CKD worsening - seen by nephrologist- multifactorial etiology- IV contrast, diuretics - has plateud  Left pleural effusion- s/p thoracentesis   Hematoma both retroperitoneal and superficial at the site of cardiac cath    Recent hemorrhagic shock needing PRBc   Injury to external iliac artery on the right which was repaired   CAD s/p stent ( restenosis of ostial LAD stent)? Afib   Discussed the management with patient and vascular

## 2023-05-07 NOTE — Progress Notes (Signed)
Progress Note    05/07/2023 12:58 PM 6 Days Post-Op  Subjective:  Dawn Bruce is a 77 yo female who presents to [facility] emergency room with post operative right groin infection.  Patient is now postop day 10 from right groin incision and drainage with wound washout and wound VAC placement for the fourth time. Wound vac change at the bedside today with IV Dilaudid 1 mg. Patient tolerated well. Patient is resting comfortably in bed this afternoon.  Wound VAC is in place and working well.  Noted serosanguineous drainage to canister and the about 60 mL before changing it.  Patient does endorse some pain and soreness to her right groin but states it is manageable.  Patient remains on her 2.5 mg of Eliquis twice daily for her atrial fibrillation and continues on ASA 81 mg daily and Plavix 75 mg daily post surgical repair of her right femoral artery.    Vitals:   05/07/23 0838 05/07/23 0928  BP: (!) 194/103 (!) 163/86  Pulse: (!) 111 (!) 110  Resp: 18 16  Temp:    SpO2: 96% 99%   Physical Exam: Cardiac:  Hx of Atrial Fibrillation. Regular Rate  Lungs: Bilateral lungs with diminished breath sounds bases. Mild Dyspnea with cough. No wheezing noted. Non Labored breathing this morning. No oxygen required.  Incisions:  Right groin with wound VAC in place and working well.  Maintain suction and noted 60 mL serosanguineous drainage now. Extremities:  Bilateral lower extremities warm to touch. Palpable pulses. Noted +1 edema to right lower extremity.  Abdomen:  Positive bowel sounds throughout, soft and non tender and non distended.  Neurologic: AAOX4, Follows commands.   CBC    Component Value Date/Time   WBC 6.3 05/07/2023 0540   RBC 3.17 (L) 05/07/2023 0540   HGB 9.0 (L) 05/07/2023 0540   HGB 13.6 06/01/2022 0000   HCT 27.6 (L) 05/07/2023 0540   HCT 40.5 06/01/2022 0000   PLT 39 (L) 05/07/2023 0540   PLT 132 (L) 06/01/2022 0000   MCV 87.1 05/07/2023 0540   MCV 90 06/01/2022 0000   MCH  28.4 05/07/2023 0540   MCHC 32.6 05/07/2023 0540   RDW 15.7 (H) 05/07/2023 0540   RDW 12.8 06/01/2022 0000   LYMPHSABS 0.5 (L) 05/06/2023 1338   LYMPHSABS 0.7 08/30/2021 0917   MONOABS 0.9 05/06/2023 1338   EOSABS 0.3 05/06/2023 1338   EOSABS 0.0 08/30/2021 0917   BASOSABS 0.0 05/06/2023 1338   BASOSABS 0.0 08/30/2021 0917    BMET    Component Value Date/Time   NA 138 05/07/2023 0540   NA 147 (H) 06/01/2022 0000   K 4.4 05/07/2023 0540   CL 105 05/07/2023 0540   CO2 25 05/07/2023 0540   GLUCOSE 107 (H) 05/07/2023 0540   BUN 87 (H) 05/07/2023 0540   BUN 23 06/01/2022 0000   CREATININE 4.46 (H) 05/07/2023 0540   CALCIUM 8.5 (L) 05/07/2023 0540   GFRNONAA 10 (L) 05/07/2023 0540   GFRAA 54 (L) 10/08/2019 1419    INR    Component Value Date/Time   INR 1.5 (H) 04/22/2023 1540     Intake/Output Summary (Last 24 hours) at 05/07/2023 1258 Last data filed at 05/07/2023 0517 Gross per 24 hour  Intake 250 ml  Output 1300 ml  Net -1050 ml     Assessment/Plan:  77 y.o. female is s/p right groin postop infection with incision and drainage of wound with wound VAC exchange x 4  6 Days Post-Op  PLAN: Dr Vilinda Flake and myself changed the wound vac at the bedside today. Patient tolerated well. Wound is reducing in size.  Plan is to change wound vac at the bedside again on Thursday for possible discharge home on Friday if medically stable.   DVT prophylaxis:  Eliquis 2.5 mg twice daily, aspirin 81 mg daily, Plavix 75 mg daily.    [name] 05/07/2023 12:58 PM

## 2023-05-07 NOTE — Progress Notes (Signed)
Consult for PICC dressing due. Patient asleep currently. Primary RN advised to reconsult when patient is awake or during day time hours for routine line care.

## 2023-05-07 NOTE — Progress Notes (Signed)
[facility]  ROUNDING NOTE   Subjective:   Dawn Bruce  is a 77 y.o. female with medical problems of hypertension, heart failure with preserved ejection fraction, history of pleural effusions, history of diffuse vascular disease including coronary artery disease-history of PCI in LAD stent October 2023, chronic kidney disease, prediabetes, atrial fibrillation status post cardioversion in 2022 followed by Dr. Alyson Locket at [facility] nephrology. Patient presents to the ED with hypotension and suspected wound infection. She has been admitted for Cellulitis of right groin [L03.314] Cellulitis of right thigh [L03.115]  Patient is known to our practice from previous admission.   Patient seen sitting up in bed Family at bedside Reports poor appetite persist Tolerating decent fluid intake Complains of loss of appetite   Objective:  Vital signs in last 24 hours:  Temp:  [97.7 F (36.5 C)-98.2 F (36.8 C)] 98.2 F (36.8 C) (12/16 0801) Pulse Rate:  [95-111] 95 (12/16 1520) Resp:  [16-20] 19 (12/16 1520) BP: (147-196)/(69-111) 172/79 (12/16 1520) SpO2:  [92 %-99 %] 93 % (12/16 1520)  Weight change:  Filed Weights   05/04/23 0450 05/05/23 0448 05/06/23 0500  Weight: 75.2 kg 75.8 kg 75.8 kg    Intake/Output: I/O last 3 completed shifts: In: 250 [IV Piggyback:250] Out: 2300 [Urine:2300]   Intake/Output this shift:  No intake/output data recorded.  Physical Exam: General: NAD  Head: Normocephalic, atraumatic. Moist oral mucosal membranes  Eyes: Anicteric  Lungs:  Clear to auscultation.normal effort  Heart: Regular rate and rhythm  Abdomen:  Soft, nontender,   Extremities:  No peripheral edema.  Neurologic: Alert and oriented, moving all four extremities  Skin: No lesions       Basic Metabolic Panel: Recent Labs  Lab 05/03/23 0659 05/04/23 0530 05/05/23 0525 05/06/23 0509 05/07/23 0540  NA 137 137 137 137 138  K 4.6 4.5 4.6 4.6 4.4  CL 106 106 106 105 105  CO2  21* 22 21* 22 25  GLUCOSE 105* 105* 106* 105* 107*  BUN 79* 88* 91* 92* 87*  CREATININE 3.95* 4.56* 4.67* 4.67* 4.46*  CALCIUM 8.2* 8.1* 8.3* 8.3* 8.5*  MG 2.3 2.2  --   --   --   PHOS  --  6.8*  --   --   --     Liver Function Tests: Recent Labs  Lab 04/30/23 1630  AST 24  ALT 25  ALKPHOS 59  BILITOT 0.9  PROT 4.8*  ALBUMIN 2.1*   No results for input(s): "LIPASE", "AMYLASE" in the last 168 hours. No results for input(s): "AMMONIA" in the last 168 hours.  CBC: Recent Labs  Lab 05/03/23 0659 05/04/23 0530 05/06/23 0509 05/06/23 1338 05/07/23 0540  WBC 7.8 6.8 6.3 7.2 6.3  NEUTROABS  --   --   --  5.4  --   HGB 9.8* 9.5* 9.5* 9.8* 9.0*  HCT 30.3* 29.1* 28.9* 29.8* 27.6*  MCV 90.2 88.2 86.5 87.9 87.1  PLT 117* 81* 51* 50* 39*    Cardiac Enzymes: Recent Labs  Lab 05/04/23 0530  CKTOTAL 38    BNP: Invalid input(s): "POCBNP"  CBG: No results for input(s): "GLUCAP" in the last 168 hours.   Microbiology: Results for orders placed or performed during the hospital encounter of 04/22/23  Blood Culture (routine x 2)     Status: None   Collection Time: 04/22/23  3:28 PM   Specimen: BLOOD  Result Value Ref Range Status   Specimen Description BLOOD RIGHT ANTECUBITAL  Final   Special  Requests   Final    BOTTLES DRAWN AEROBIC AND ANAEROBIC Blood Culture results may not be optimal due to an excessive volume of blood received in culture bottles   Culture   Final    NO GROWTH 5 DAYS Performed at [facility], [address] [place], [place] [address]    Report Status 04/27/2023 FINAL  Final  C Difficile Quick Screen w PCR reflex     Status: None   Collection Time: 04/22/23  4:19 PM   Specimen: STOOL  Result Value Ref Range Status   C Diff antigen NEGATIVE NEGATIVE Final   C Diff toxin NEGATIVE NEGATIVE Final   C Diff interpretation No C. difficile detected.  Final    Comment: Performed at [facility], [address] [place], [place]  [address]  Gastrointestinal Panel by PCR , Stool     Status: None   Collection Time: 04/22/23  4:25 PM   Specimen: Stool  Result Value Ref Range Status   Campylobacter species NOT DETECTED NOT DETECTED Final   Plesimonas shigelloides NOT DETECTED NOT DETECTED Final   Salmonella species NOT DETECTED NOT DETECTED Final   Yersinia enterocolitica NOT DETECTED NOT DETECTED Final   Vibrio species NOT DETECTED NOT DETECTED Final   Vibrio cholerae NOT DETECTED NOT DETECTED Final   Enteroaggregative E coli (EAEC) NOT DETECTED NOT DETECTED Final   Enteropathogenic E coli (EPEC) NOT DETECTED NOT DETECTED Final   Enterotoxigenic E coli (ETEC) NOT DETECTED NOT DETECTED Final   Shiga like toxin producing E coli (STEC) NOT DETECTED NOT DETECTED Final   Shigella/Enteroinvasive E coli (EIEC) NOT DETECTED NOT DETECTED Final   Cryptosporidium NOT DETECTED NOT DETECTED Final   Cyclospora cayetanensis NOT DETECTED NOT DETECTED Final   Entamoeba histolytica NOT DETECTED NOT DETECTED Final   Giardia lamblia NOT DETECTED NOT DETECTED Final   Adenovirus F40/41 NOT DETECTED NOT DETECTED Final   Astrovirus NOT DETECTED NOT DETECTED Final   Norovirus GI/GII NOT DETECTED NOT DETECTED Final   Rotavirus A NOT DETECTED NOT DETECTED Final   Sapovirus (I, II, IV, and V) NOT DETECTED NOT DETECTED Final    Comment: Performed at [facility], [address] [place], [place] [address]  Blood Culture (routine x 2)     Status: None   Collection Time: 04/23/23  6:31 AM   Specimen: BLOOD  Result Value Ref Range Status   Specimen Description BLOOD BLOOD RIGHT HAND  Final   Special Requests   Final    BOTTLES DRAWN AEROBIC AND ANAEROBIC Blood Culture results may not be optimal due to an inadequate volume of blood received in culture bottles   Culture   Final    NO GROWTH 5 DAYS Performed at [facility], [address] [place], [place] [address]    Report Status 04/28/2023 FINAL  Final  Aerobic Culture w  Gram Stain (superficial specimen)     Status: None   Collection Time: 04/23/23 10:42 AM   Specimen: Groin  Result Value Ref Range Status   Specimen Description   Final    GROIN Performed at [facility], [address], [place], [place] [address]    Special Requests   Final    NONE Performed at [facility], [address] [place], [place] [address]    Gram Stain   Final    RARE WBC PRESENT, PREDOMINANTLY PMN RARE GRAM NEGATIVE RODS RARE GRAM NEGATIVE COCCOBACILLI Performed at [facility], [address], [place], [place] [address]    Culture   Final    MODERATE MORGANELLA MORGANII FEW ESCHERICHIA COLI FEW PROTEUS MIRABILIS    Report Status 04/26/2023 FINAL  Final   Organism ID, Bacteria ESCHERICHIA COLI  Final   Organism ID, Bacteria MORGANELLA MORGANII  Final   Organism ID, Bacteria PROTEUS MIRABILIS  Final      Susceptibility   Escherichia coli - MIC*    AMPICILLIN >=32 RESISTANT Resistant     CEFEPIME <=0.12 SENSITIVE Sensitive     CEFTAZIDIME <=1 SENSITIVE Sensitive     CEFTRIAXONE <=0.25 SENSITIVE Sensitive     CIPROFLOXACIN <=0.25 SENSITIVE Sensitive     GENTAMICIN <=1 SENSITIVE Sensitive     IMIPENEM <=0.25 SENSITIVE Sensitive     TRIMETH/SULFA <=20 SENSITIVE Sensitive     AMPICILLIN/SULBACTAM 16 INTERMEDIATE Intermediate     PIP/TAZO <=4 SENSITIVE Sensitive ug/mL    * FEW ESCHERICHIA COLI   Morganella morganii - MIC*    AMPICILLIN >=32 RESISTANT Resistant     CEFTAZIDIME <=1 SENSITIVE Sensitive     CIPROFLOXACIN <=0.25 SENSITIVE Sensitive     GENTAMICIN <=1 SENSITIVE Sensitive     IMIPENEM 4 SENSITIVE Sensitive     TRIMETH/SULFA <=20 SENSITIVE Sensitive     AMPICILLIN/SULBACTAM 16 INTERMEDIATE Intermediate     PIP/TAZO <=4 SENSITIVE Sensitive ug/mL    * MODERATE MORGANELLA MORGANII   Proteus mirabilis - MIC*    AMPICILLIN <=2 SENSITIVE Sensitive     CEFEPIME <=0.12 SENSITIVE Sensitive     CEFTAZIDIME <=1 SENSITIVE Sensitive      CEFTRIAXONE <=0.25 SENSITIVE Sensitive     CIPROFLOXACIN <=0.25 SENSITIVE Sensitive     GENTAMICIN <=1 SENSITIVE Sensitive     IMIPENEM 2 SENSITIVE Sensitive     TRIMETH/SULFA <=20 SENSITIVE Sensitive     AMPICILLIN/SULBACTAM <=2 SENSITIVE Sensitive     PIP/TAZO <=4 SENSITIVE Sensitive ug/mL    * FEW PROTEUS MIRABILIS  Aerobic/Anaerobic Culture w Gram Stain (surgical/deep wound)     Status: None   Collection Time: 04/25/23  2:41 PM   Specimen: Wound; Body Fluid  Result Value Ref Range Status   Specimen Description   Final    WOUND Performed at [facility], [address] [place], [place] [address]    Special Requests RETROPERITONEAL HEMATOMA SPEC A  Final   Gram Stain   Final    FEW WBC PRESENT,BOTH PMN AND MONONUCLEAR NO ORGANISMS SEEN    Culture   Final    RARE ENTEROCOCCUS FAECALIS NO ANAEROBES ISOLATED Performed at [facility], [address], [place], [place] [address]    Report Status 05/02/2023 FINAL  Final   Organism ID, Bacteria ENTEROCOCCUS FAECALIS  Final      Susceptibility   Enterococcus faecalis - MIC*    AMPICILLIN <=2 SENSITIVE Sensitive     VANCOMYCIN 2 SENSITIVE Sensitive     GENTAMICIN SYNERGY SENSITIVE Sensitive     * RARE ENTEROCOCCUS FAECALIS    Coagulation Studies: No results for input(s): "LABPROT", "INR" in the last 72 hours.  Urinalysis: No results for input(s): "COLORURINE", "LABSPEC", "PHURINE", "GLUCOSEU", "HGBUR", "BILIRUBINUR", "KETONESUR", "PROTEINUR", "UROBILINOGEN", "NITRITE", "LEUKOCYTESUR" in the last 72 hours.  Invalid input(s): "APPERANCEUR"    Imaging: DG Chest Port 1 View Result Date: 05/06/2023 CLINICAL DATA:  [id number] Interstitial edema [id number]. EXAM: PORTABLE CHEST 1 VIEW COMPARISON:  Chest radiograph 05/03/2023. FINDINGS: Unchanged right upper extremity PICC with tip projecting over the low SVC. Similar moderate left pleural effusion and trace right pleural  effusion with adjacent atelectasis in the lung  bases. No pulmonary edema. Stable cardiac and mediastinal contours. No pneumothorax. IMPRESSION: Similar moderate left pleural effusion and trace right pleural effusion with adjacent atelectasis in the lung bases. Electronically Signed   By: Orvan Falconer M.D.   On: 05/06/2023 16:16     Medications:    DAPTOmycin      alum & mag hydroxide-simeth  15 mL Oral Once   apixaban  2.5 mg Oral BID   aspirin EC  81 mg Oral Daily   atorvastatin  40 mg Oral Daily   carvedilol  6.25 mg Oral BID WC   Chlorhexidine Gluconate Cloth  6 each Topical Daily   ciprofloxacin  500 mg Oral Q breakfast   clopidogrel  75 mg Oral Daily   ezetimibe  10 mg Oral Daily   fentaNYL  1 patch Transdermal Q72H   pregabalin  75 mg Oral Daily   sodium chloride flush  10-40 mL Intracatheter Q12H   traZODone  50 mg Oral QHS   acetaminophen, albuterol, oxyCODONE, phenylephrine, sodium chloride flush, witch hazel-glycerin  Assessment/ Plan:  Dawn Bruce is a 77 y.o.  female with medical problems of hypertension, heart failure with preserved ejection fraction, history of pleural effusions, history of diffuse vascular disease including coronary artery disease-history of PCI in LAD stent October 2023, chronic kidney disease, prediabetes, atrial fibrillation status post cardioversion in 2022 followed by Dr. Alyson Locket at [facility] nephrology. Patient presents to the ED with hypotension and suspected wound infection. She has been admitted for Cellulitis of right groin [L03.314] Cellulitis of right thigh [L03.115]   Acute Kidney Injury on chronic kidney disease stage IIIb with baseline creatinine 1.3 and GFR of 42 on 03/22/23.  Acute kidney injury appears multifactorial, hypotension, IV contrast exposure, and diuretics. Creatinine on admission, 1.17.  Patient experienced hypertension with subsequent hypotension.  IV contrast exposure on 04/22/2023.  Received approximately 2 days of diuresis.    Creatinine appears to have plateaued  during the weekend and has improved today.  Adequate urine output of 1.7 L recorded.  No acute indication for dialysis but monitoring closely.  Patient encouraged to increase oral intake and request Ensure supplementation between meals.  Lab Results  Component Value Date   CREATININE 4.46 (H) 05/07/2023   CREATININE 4.67 (H) 05/06/2023   CREATININE 4.67 (H) 05/05/2023    Intake/Output Summary (Last 24 hours) at 05/07/2023 1535 Last data filed at 05/07/2023 0517 Gross per 24 hour  Intake --  Output 1300 ml  Net -1300 ml   2. Anemia of chronic kidney disease Lab Results  Component Value Date   HGB 9.0 (L) 05/07/2023    Hemoglobin at goal.  Will monitor for now.  3.  Hypertension, home regimen includes carvedilol, isosorbide, and valsartan.  Blood pressure elevated today.  Currently receiving carvedilol only.   LOS: 15 Dreydon Cardenas 12/16/20243:35 PM

## 2023-05-07 NOTE — Progress Notes (Signed)
PULMONOLOGY         Date: 05/07/2023,   MRN# [id number] Dawn Bruce 12/16/1945     AdmissionWeight: 54.4 kg                 CurrentWeight: 75.8 kg  Referring provider: Dr Gilda Crease   CHIEF COMPLAINT:   Complete atelectasis of left lung with bilateral pleural effusions.    HISTORY OF PRESENT ILLNESS   77 year old female with a history of chronic GERD, basal cell carcinoma squamous cell carcinoma history of NSTEMI diastolic CHF, CAD status post recent PCI with DES, chronic A-fib on Eliquis, essential hypertension, dyslipidemia CKD 3A chronic pain syndrome remote perforated appendicitis, hospitalized November 2024 with non-STEMI.  Post left heart cath had developed retroperitoneal hematoma and hemorrhagic shock with complications of iliac artery laceration.  Status post surgical repair via vascular surgery service.  Had outpatient therapy with antibiotics with failure came in with transient hypotension and circulatory shock.  Found to have elevated cardiac biomarkers and acute on chronic CHF.  Vascular surgery was reconsulted today and PCCM consultation was placed to incidental finding of complete atelectasis of the left lung with surrounding bilateral pleural effusions worse on the left.  04/25/23- patient evaluated in PACU post op , appears to be mildy sedated still. Mildy hypotensive at this moment.  Lung sounds much improved post 1.6L left pleural space aspiration.  04/26/23- patient seen and examined , Dawn Bruce (daughter) at bedside.  Patient receiving blood transfusion.  Lung sounds are clear , vitals are stable.  Hypoalbulminemia continues. Will order urine protein and pre albumin levels,  because patient shares she's eating well.  CXR in am to check interval effusion.  04/27/23- patient seen at bedside, husband and daughter present.  For surgery wound washout and vac exchange today.   Vitals stable , room air spO2 99%     04/30/23- patient seen at bedside.  Has  additional surgery planned with debridement.  CXR with recurring effusion and associated compressive atelelctasis.   Ideally would like to increase diuresis and recruitment maunevers.  E. Faecallis noted on wound culture.  I discussed case with Dr Avon Gully infectious disease specialist today.  Dr Dareen Piano is also considering pleurX catheter which patient may need if this does not resolve with medical management. She already had multiple thoracentesis. No noew labs are available for review but are in process. Will order purewick for urination due to physical weakness.      05/01/23-  patient reports improvement post diuresis.  Will obtain interval CXR today.  We discussed PleurX permanent tunneled catheter and she declined to have this done at the moment.  She is on room air with normoxia.  She developed mild contraction and we will take diuretic holiday.   05/02/23- patient with severe hypotension.  I have dcd her beta blocker today and imdur. She is now getting IV fluids and is able to speak.  Will start midodrine 5mg  po tid.  05/03/23- patient is now no longer hypotensive.  She had AKI and this is very likely related to circulatory shock over past 24h.  She had fluid via IV and I'm concerned regarding reacculumation of pleural fluid so I have dcd this now since she is hypertensive.  There is nephrology consult in progress.  05/04/23- patient is improved clinically, she has improved vital signs,  she does have AKI but she is making light yellow urine.  She has nephrology evaluation.  There is consideration for HD.  05/06/23- patient clinically  reporting mildy worsening SOB.  Repeat CXR reviewed by me with improved right hemithorax but worse on left with reaccumulated effusion moderate.  She has progressive thrombocytopenia with today platelets <51, does not appear toxic at all and is lucid alert and appropriate.  She has PCI x2 in the past.  Patient is uremic and am concerned regarding potential for  bleeding but same time concern for stent thrombosis/clotting with AF/CAD.  Have discussed with team and will ask for additional evaluation with cardiology.  Dr Chipper Herb has also notified oncology to help with blood abnormalities.   05/07/23- patient continues to produce adequate urine light yellow in color renal function is improved, respiratory status however seems  more labored today.  Her pleural effusion has re-accumulated and we will order US thoracentesis today. She had oncology and cardiology evaluation.  It seems thrombocytopenia may be due to Zosyn.   She had vascular surgery evaluation today.     PAST MEDICAL HISTORY   Past Medical History:  Diagnosis Date   Actinic keratosis    Chronic pain    GERD (gastroesophageal reflux disease) ?   Taking Pepcid   History of basal cell carcinoma (BCC) 05/02/2021   right upper back paraspinal   History of SCC (squamous cell carcinoma) of skin 09/10/2019   left dorsum wrist ED&C done 10/28/19   History of SCC (squamous cell carcinoma) of skin 03/04/2020   right dorsum hand   History of squamous cell carcinoma in situ (SCCIS) 03/04/2020   left dorsum wrist  ED&C   History of squamous cell carcinoma in situ (SCCIS) 03/04/2020   right medial infraorbital  ED&C 04/28/2020   History of squamous cell carcinoma in situ (SCCIS) 10/28/2019   right dorsum hand proximal lateral   History of squamous cell carcinoma in situ (SCCIS) 10/28/2019   right dorsum hand proximal medial   Hyperlipidemia    Hypertension    NSTEMI (non-ST elevated myocardial infarction) (HCC) 04/12/2023   Renal disorder    Squamous cell carcinoma in situ 10/31/2021   left distal  tricep, EDC   Squamous cell carcinoma of skin 08/28/2022   Right volar forearm - [facility]     SURGICAL HISTORY   Past Surgical History:  Procedure Laterality Date   ABDOMINAL HYSTERECTOMY     APPENDECTOMY  April 2023   Burst appendix   APPLICATION OF WOUND VAC Right 04/25/2023   Procedure: APPLICATION  OF WOUND VAC;  Surgeon: Renford Dills, MD;  Location: [facility] ORS;  Service: Vascular;  Laterality: Right;   APPLICATION OF WOUND VAC Right 04/27/2023   Procedure: WOUND VAC EXCHANGE;  Surgeon: Renford Dills, MD;  Location: [facility];  Service: Vascular;  Laterality: Right;   APPLICATION OF WOUND VAC Right 05/01/2023   Procedure: APPLICATION OF WOUND VAC;  Surgeon: Renford Dills, MD;  Location: [facility];  Service: Vascular;  Laterality: Right;   AUGMENTATION MAMMAPLASTY Bilateral    25-30 years ago   CARDIOVERSION N/A 12/01/2020   Procedure: CARDIOVERSION;  Surgeon: Debbe Odea, MD;  Location: [facility];  Service: Cardiovascular;  Laterality: N/A;   CAROTID ARTERY ANGIOPLASTY Right 1995(approximate)   CAROTID ENDARTERECTOMY  2001   CORONARY STENT INTERVENTION N/A 04/13/2023   Procedure: CORONARY STENT INTERVENTION;  Surgeon: Marcina Millard, MD;  Location: [facility];  Service: Cardiovascular;  Laterality: N/A;   COSMETIC SURGERY     ELBOW SURGERY Left    ENDARTERECTOMY FEMORAL Right 04/13/2023   Procedure: Right groin exploration with repair of right external  iliac artery and right circumflex artery;  Surgeon: Bertram Denver, MD;  Location: [facility];  Service: Vascular;  Laterality: Right;   FRACTURE SURGERY  2016   Arm   INCISION AND DRAINAGE OF WOUND Right 04/25/2023   Procedure: IRRIGATION AND DEBRIDEMENT WOUND;  Surgeon: Renford Dills, MD;  Location: [facility];  Service: Vascular;  Laterality: Right;   INCISION AND DRAINAGE OF WOUND Right 04/27/2023   Procedure: IRRIGATION AND DEBRIDEMENT WOUND;  Surgeon: Renford Dills, MD;  Location: [facility];  Service: Vascular;  Laterality: Right;   INCISION AND DRAINAGE OF WOUND Right 05/01/2023   Procedure: IRRIGATION AND DEBRIDEMENT WOUND;  Surgeon: Renford Dills, MD;  Location: [facility];  Service: Vascular;  Laterality: Right;   LEFT HEART CATH AND CORONARY ANGIOGRAPHY N/A 04/13/2023   Procedure: LEFT  HEART CATH AND CORONARY ANGIOGRAPHY;  Surgeon: Marcina Millard, MD;  Location: [facility];  Service: Cardiovascular;  Laterality: N/A;   THORACENTESIS N/A 12/27/2020   Procedure: Alanson Puls;  Surgeon: Josephine Igo, DO;  Location: [facility];  Service: Pulmonary;  Laterality: N/A;   TONSILLECTOMY     TUBAL LIGATION  ?     FAMILY HISTORY   Family History  Problem Relation Age of Onset   Cancer Mother    Cancer Father    Cancer Sister    Breast cancer Neg Hx      SOCIAL HISTORY   Social History   Tobacco Use   Smoking status: Former    Current packs/day: 0.00    Average packs/day: 0.3 packs/day for 15.0 years (3.8 ttl pk-yrs)    Types: Cigarettes    Start date: 74    Quit date: 30    Years since quitting: 34.9    Passive exposure: Past   Smokeless tobacco: Never   Tobacco comments:    quit around 1990-1995 (?)  Vaping Use   Vaping status: Never Used  Substance Use Topics   Alcohol use: Yes    Alcohol/week: 14.0 standard drinks of alcohol    Types: 14 Standard drinks or equivalent per week    Comment: 2 scotch shots   Drug use: Never     MEDICATIONS    Home Medication:     Current Medication:  Current Facility-Administered Medications:    acetaminophen (TYLENOL) tablet 650 mg, 650 mg, Oral, Q6H PRN, Schnier, Latina Craver, MD, 650 mg at 05/05/23 1809   albuterol (PROVENTIL) (2.5 MG/3ML) 0.083% nebulizer solution 2.5 mg, 2.5 mg, Nebulization, Q4H PRN, Schnier, Latina Craver, MD, 2.5 mg at 05/07/23 0802   alum & mag hydroxide-simeth (MAALOX/MYLANTA) 200-200-20 MG/5ML suspension 15 mL, 15 mL, Oral, Once, Schnier, Latina Craver, MD   amoxicillin-clavulanate (AUGMENTIN) 500-125 MG per tablet 1 tablet, 1 tablet, Oral, Daily, Foye Deer, RPH, 1 tablet at 05/07/23 1149   apixaban (ELIQUIS) tablet 2.5 mg, 2.5 mg, Oral, BID, Schnier, Latina Craver, MD, 2.5 mg at 05/07/23 0831   aspirin EC tablet 81 mg, 81 mg, Oral, Daily, Schnier, Latina Craver, MD, 81 mg at  05/07/23 0831   atorvastatin (LIPITOR) tablet 40 mg, 40 mg, Oral, Daily, Karna Christmas, Snow Peoples, MD, 40 mg at 05/07/23 0831   carvedilol (COREG) tablet 6.25 mg, 6.25 mg, Oral, BID WC, Marrion Coy, MD, 6.25 mg at 05/07/23 1149   Chlorhexidine Gluconate Cloth 2 % PADS 6 each, 6 each, Topical, Daily, Schnier, Latina Craver, MD, 6 each at 05/07/23 1610   ciprofloxacin (CIPRO) tablet 500 mg, 500 mg, Oral, Q breakfast, Marrion Coy, MD,  500 mg at 05/07/23 0831   clopidogrel (PLAVIX) tablet 75 mg, 75 mg, Oral, Daily, Schnier, Latina Craver, MD, 75 mg at 05/07/23 0831   ezetimibe (ZETIA) tablet 10 mg, 10 mg, Oral, Daily, Schnier, Latina Craver, MD, 10 mg at 05/07/23 0831   fentaNYL (DURAGESIC) 75 MCG/HR 1 patch, 1 patch, Transdermal, Q72H, Schnier, Latina Craver, MD, 1 patch at 05/05/23 1341   HYDROmorphone (DILAUDID) injection 1 mg, 1 mg, Intravenous, STAT, Angelique Blonder, RPH   oxyCODONE (Oxy IR/ROXICODONE) immediate release tablet 5 mg, 5 mg, Oral, Q6H PRN, Schnier, Latina Craver, MD, 5 mg at 05/04/23 0008   phenylephrine ((USE for PREPARATION-H)) 0.25 % suppository 1 suppository, 1 suppository, Rectal, PRN, Schnier, Latina Craver, MD   pregabalin (LYRICA) capsule 75 mg, 75 mg, Oral, Daily, Marrion Coy, MD, 75 mg at 05/07/23 0831   sodium chloride flush (NS) 0.9 % injection 10-40 mL, 10-40 mL, Intracatheter, Q12H, Schnier, Latina Craver, MD, 10 mL at 05/07/23 1149   sodium chloride flush (NS) 0.9 % injection 10-40 mL, 10-40 mL, Intracatheter, PRN, Schnier, Latina Craver, MD   traZODone (DESYREL) tablet 50 mg, 50 mg, Oral, QHS, Schnier, Latina Craver, MD, 50 mg at 05/06/23 2100   witch hazel-glycerin (TUCKS) pad, , Topical, PRN, Marrion Coy, MD    ALLERGIES   Patient has no known allergies.     REVIEW OF SYSTEMS    Review of Systems:  Gen:  Denies  fever, sweats, chills weigh loss  HEENT: Denies blurred vision, double vision, ear pain, eye pain, hearing loss, nose bleeds, sore throat Cardiac:  No dizziness, chest pain or  heaviness, chest tightness,edema Resp:   reports dyspnea chronically  Gi: Denies swallowing difficulty, stomach pain, nausea or vomiting, diarrhea, constipation, bowel incontinence Gu:  Denies bladder incontinence, burning urine Ext:   Denies Joint pain, stiffness or swelling Skin: Denies  skin rash, easy bruising or bleeding or hives Endoc:  Denies polyuria, polydipsia , polyphagia or weight change Psych:   Denies depression, insomnia or hallucinations   Other:  All other systems negative   VS: BP (!) 163/86 (BP Location: Right Arm)   Pulse (!) 110   Temp 98.2 F (36.8 C)   Resp 16   Ht 5' 2.99" (1.6 m)   Wt 75.8 kg   SpO2 99%   BMI 29.61 kg/m      PHYSICAL EXAM    GENERAL:NAD, no fevers, chills, no weakness no fatigue HEAD: Normocephalic, atraumatic.  EYES: Pupils equal, round, reactive to light. Extraocular muscles intact. No scleral icterus.  MOUTH: Moist mucosal membrane. Dentition intact. No abscess noted.  EAR, NOSE, THROAT: Clear without exudates. No external lesions.  NECK: Supple. No thyromegaly. No nodules. No JVD.  PULMONARY: decreased breath sounds with mild rhonchi worse at bases bilaterally.  CARDIOVASCULAR: S1 and S2. Regular rate and rhythm. No murmurs, rubs, or gallops. No edema. Pedal pulses 2+ bilaterally.  GASTROINTESTINAL: Soft, nontender, nondistended. No masses. Positive bowel sounds. No hepatosplenomegaly.  MUSCULOSKELETAL: No swelling, clubbing, or edema. Range of motion full in all extremities.  NEUROLOGIC: Cranial nerves II through XII are intact. No gross focal neurological deficits. Sensation intact. Reflexes intact.  SKIN: No ulceration, lesions, rashes, or cyanosis. Skin warm and dry. Turgor intact.  PSYCHIATRIC: Mood, affect within normal limits. The patient is awake, alert and oriented x 3. Insight, judgment intact.       IMAGING     ASSESSMENT/PLAN    Complete atelectasis of left lung   - patient s/p diuresis with  partial  improvement however still has minimal lung sounds on left.   - she does have CKD 3 and may not produce adequate fluid removal via medication alone   - we discussed options for thoracentesis and patient would like to proceed with this procedure.  -incentive spirometry at bedside to be used multiple times daily  - PT/OT - etiology is likely poor nutrition post ICU hospitalization with CHF and CKD all contributing to third spaced interstitial edema , peripheral edema and effusion -repeat thoracentesis    Bilateral pleural effusions    - s/p throacentesis Sept 2024   - consultation for IR with US guided throacentesis - therapeutic    -patient on eliquis -prealbumin -urine protein -holding diuresis today due to [facility] and will continue to monitor renal function.  I anticipate this should improve with normal BP now. -appreciate nephrology consultation  -05/06/23- CXR with reaccumuation of left effusion   Severe AKI stage 5    Nephrology on case -appreciate input     - non oliguric at this moment , no HD today per specialist    AF with CAD s/p PCI x 2   - patient on plavix and eliquis     - s/p complicated surgery with multiple transfusions.  Now with progressive thrombocytopenia and increased potential for bleeding with uremia   Severe thrombocytopenia   - oncology on case - appreciate input - Dr B   - workup in progress  Sepsis due to right groin cellulitis   S/p vascular surgery eval S/p vanco /rocephin  - patient appears to be clinically improved.  - s/p ID revaluation - appreciate input  -E faecalis noted on wound care   Moderate protein calorie malnutrition   - nutritional consultation    - bitemporal wasting and peripheral muscle weakness noted   - contributing to edema/effusion   - complicating tissue healing  -high protein diet  Thank you for allowing me to participate in the care of this patient.  Patient/Family are satisfied with care plan and all questions have been  answered.    Provider disclosure: Patient with at least one acute or chronic illness or injury that poses a threat to life or bodily function and is being managed actively during this encounter.  All of the below services have been performed independently by signing provider:  review of prior documentation from internal and or external health records.  Review of previous and current lab results.  Interview and comprehensive assessment during patient visit today. Review of current and previous chest radiographs/CT scans. Discussion of management and test interpretation with health care team and patient/family.   This document was prepared using Dragon voice recognition software and may include unintentional dictation errors.     Vida Rigger, M.D.  Division of Pulmonary & Critical Care Medicine

## 2023-05-07 NOTE — Procedures (Signed)
PROCEDURE SUMMARY:  Successful image-guided left therapeutic thoracentesis. Yielded 1.5 liters of straw-colored pleural fluid. Patient tolerated procedure well. EBL: Zero No immediate complications.  Specimens were not sent for labs. Post procedure CXR shows no pneumothorax.  Please see imaging section of Epic for full dictation.  Bing Neighbors Vaun Hyndman PA-C 05/07/2023 4:32 PM

## 2023-05-07 NOTE — Progress Notes (Addendum)
Progress Note   Patient: Dawn Bruce AOZ:[id number] DOB: 24-Feb-1946 DOA: 04/22/2023     15 DOS: the patient was seen and examined on 05/07/2023   Brief hospital course: Dawn Bruce is a 77 y.o. female with medical history significant of dCHF, CAD (s/p of recent DES), A fib on Eliquis, HTN, HLD, CDK-3a, chronic pain syndrome, multiple skin cancer, perforated appendicitis, former smoker, who presents with right groin pain.  Patient was recently hospitalized from 11/21 - 11/29 due to non-STEMI. Patient is s/p of LHC with DES placement. Pt developed retroperitoneal hematoma & hemorrhagic shock due to complication of Iliac artery laceration during left heart cath of right groin. Pt underwent surgical repair of external iliac artery by [facility] on 11/23.  Presented back to ED 12/1 due to increased redness, swelling, and pain at incision site of the cath. Failed outpatient Abx x1 dose.  Hypotensive to 80/50 on presentation. 12/2: pulmonology, and IR consulted to address pleural effusion. ID consulted to address abx course. Transitioned to linezolid. Vascular evaluated and recommended debridement. 12/3: Thoracentesis removed 1.6L left side 12/4:stable ORA, afebrile. Wound clinically greatly improved. Debridement today. Plan to stop heparin gtt and return to eliquis + DAPT s/p procedure 12/6: additional debridement.  Debridement again 12/10. Now on zosyn. Doing well. Respiratory status stable ORA.  12/12.  Patient had a worsening renal function, nephrology consult obtained. 12/14.  Worsening thrombocytopenia, consult hematology oncology to rule out TTS.   Principal Problem:   Cellulitis of right groin Active Problems:   Sepsis (HCC)   CAD (coronary artery disease)   Peritoneal hematoma   Essential hypertension   Chronic diastolic heart failure (HCC)   Hypokalemia   Chronic kidney disease, stage 3a (HCC)   Pleural effusion   Abnormal LFTs   Atrial fibrillation, chronic (HCC)   Diarrhea   Chronic  pain syndrome   HLD (hyperlipidemia)   HFrEF (heart failure with reduced ejection fraction) (HCC)   Severe sepsis (HCC)   Infected wound   Cellulitis of right thigh   Acute kidney injury superimposed on stage 3a chronic kidney disease (HCC)   Metabolic acidosis   Overweight (BMI 25.0-29.9)   Thrombocytopenia (HCC)   Assessment and Plan: Acute renal failure on chronic kidney disease stage IIIa. Hypokalemia. Mild metabolic acidosis. Transient hypotension. Patient has much worsening renal function with creatinine increased to 2.78 from 1.08 on 12/5.  This could be secondary to diuretics.  Patient also had an episode of hypotension yesterday, received a 500 mL normal saline bolus.  Blood pressure medicine was discontinued. Worsening renal function with creatinine went up to 3.9 11/12.  Reviewed prior renal ultrasound performed 11/24, no hydronephrosis.  Consulted nephrology.   Renal function finally stabilizing.  Creatinine dropped down to 4.46.     Sepsis due to cellulitis of right groin Right groin wound infection secondary to surgical repair of pseudoaneurysm. Sepsis criteria met on admission with WBC 21.3, heart rate 104, RR 29.  Lactic acid normal 1.7.   Sepsis appears to be secondary to right groin surgical wound infection.  Patient had debridement x 2, wound-vac in place Wound culture grow E. coli, Morganella morganii, Proteus Morales, Enterococcus faecalis.  Blood culture negative.  Initially treated with linezolid + cefepime, switched to Zosyn on 12/11.  Dressing change performed by vascular surgery 12/12, wound is getting better with significant granulation. Discussed with vascular surgery and ID, will continue IV antibiotics while in the hospital.  Due to severe thrombocytopenia, most likely caused by Zosyn.  Antibiotic switched to  Cipro and Augmentin on 12/15. Scheduled wound dressing change today.   Hypervolemia  Bilateral Pleural effusions Acute on chronic systolic  congestive heart failure.  Hx of CAD: s/p of recent DES. Patient initially received IV Lasix, also had a thoracentesis. Volume status has improved. Discussed with cardiology, will continue Plavix, not able to discontinue due to recent drug-eluting stent. Repeated chest x-ray on 12/15 showed moderate right-sided pleural effusion.  Pulmonology is following. Thoracentesis removed 1.5 L of fluids, will give 25 g of albumin.   Peritoneal hematoma Anemia acute on chronic. Thrombocytopenia. Hemoglobin dropped down to 7.6 12/11, received another unit of PRBC transfusion.  Normal iron, B12 level borderline, check homocystine level normal.  Hemoglobin much better. Patient developed mild thrombocytopenia of 117 on 12/12, not currently on heparin.   Patient relates has continued to drop, to 39 today.  Has been seen by hematology, thought this is due to Zosyn.  No evidence of TTTS.  No active bleeding.  Zosyn discontinued.   Essential hypertension: Blood pressure medicine has been on hold, however blood pressure still running high again.  Will restart lower dose Coreg, titrate up dose.  Hold ARB due to acute renal failure.   Atrial fibrillation, chronic (HCC): rate controlled On eliquis Restart Coreg as blood pressure has been running high again.   Diarrhea: C. difficile negative. Diarrhea improved after giving Imodium.   Chronic pain syndrome -Continue home fentanyl patch and Lyrica   HLD: -lipitor and zetia       Subjective:  Patient doing well today, diarrhea is slowing down.  No nausea vomiting.  Had some shortness of breath, receiving nebulization.  Physical Exam: Vitals:   05/07/23 0801 05/07/23 0804 05/07/23 0838 05/07/23 0928  BP: (!) 196/111  (!) 194/103 (!) 163/86  Pulse: (!) 107  (!) 111 (!) 110  Resp: 20  18 16   Temp: 98.2 F (36.8 C)     TempSrc:      SpO2: 95% 96% 96% 99%  Weight:      Height:       General exam: Appears calm and comfortable  Respiratory system:  Decreased breath sounds. Respiratory effort normal. Cardiovascular system: Irregular. No JVD, murmurs, rubs, gallops or clicks. No pedal edema. Gastrointestinal system: Abdomen is nondistended, soft and nontender. No organomegaly or masses felt. Normal bowel sounds heard. Central nervous system: Alert and oriented. No focal neurological deficits. Extremities: Symmetric 5 x 5 power. Skin: No rashes, lesions or ulcers Psychiatry: Judgement and insight appear normal. Mood & affect appropriate.    Data Reviewed:  Lab results reviewed.  Family Communication: Husband updated at bedside.  Disposition: Status is: Inpatient Remains inpatient appropriate because: Severity of disease, inpatient procedure.     Time spent: 50 minutes  Author: Marrion Coy, MD 05/07/2023 10:13 AM  For on call review [email or web address].

## 2023-05-08 ENCOUNTER — Inpatient Hospital Stay: Payer: Medicare Other

## 2023-05-08 DIAGNOSIS — I482 Chronic atrial fibrillation, unspecified: Secondary | ICD-10-CM | POA: Diagnosis not present

## 2023-05-08 DIAGNOSIS — L03314 Cellulitis of groin: Secondary | ICD-10-CM | POA: Diagnosis not present

## 2023-05-08 DIAGNOSIS — I5032 Chronic diastolic (congestive) heart failure: Secondary | ICD-10-CM | POA: Diagnosis not present

## 2023-05-08 DIAGNOSIS — N179 Acute kidney failure, unspecified: Secondary | ICD-10-CM | POA: Diagnosis not present

## 2023-05-08 LAB — BASIC METABOLIC PANEL
Anion gap: 8 (ref 5–15)
BUN: 83 mg/dL — ABNORMAL HIGH (ref 8–23)
CO2: 24 mmol/L (ref 22–32)
Calcium: 8.4 mg/dL — ABNORMAL LOW (ref 8.9–10.3)
Chloride: 108 mmol/L (ref 98–111)
Creatinine, Ser: 4.08 mg/dL — ABNORMAL HIGH (ref 0.44–1.00)
GFR, Estimated: 11 mL/min — ABNORMAL LOW (ref >60)
Glucose, Bld: 118 mg/dL — ABNORMAL HIGH (ref 70–99)
Potassium: 4.3 mmol/L (ref 3.5–5.1)
Sodium: 140 mmol/L (ref 135–145)

## 2023-05-08 LAB — CBC
HCT: 25.2 % — ABNORMAL LOW (ref 36.0–46.0)
Hemoglobin: 8.3 g/dL — ABNORMAL LOW (ref 12.0–15.0)
MCH: 28.7 pg (ref 26.0–34.0)
MCHC: 32.9 g/dL (ref 30.0–36.0)
MCV: 87.2 fL (ref 80.0–100.0)
Platelets: 43 10*3/uL — ABNORMAL LOW (ref 150–400)
RBC: 2.89 MIL/uL — ABNORMAL LOW (ref 3.87–5.11)
RDW: 15.9 % — ABNORMAL HIGH (ref 11.5–15.5)
WBC: 7.1 10*3/uL (ref 4.0–10.5)
nRBC: 0 % (ref 0.0–0.2)

## 2023-05-08 LAB — HEPARIN INDUCED PLATELET AB (HIT ANTIBODY): Heparin Induced Plt Ab: 0.041 {OD_unit} (ref 0.000–0.400)

## 2023-05-08 LAB — FIBRINOGEN: Fibrinogen: 526 mg/dL — ABNORMAL HIGH (ref 210–475)

## 2023-05-08 LAB — HEPATITIS B SURFACE ANTIBODY, QUANTITATIVE: Hep B S AB Quant (Post): <3.5 m[IU]/mL — ABNORMAL LOW

## 2023-05-08 MED ORDER — GUAIFENESIN-DM 100-10 MG/5ML PO SYRP
5.0000 mL | ORAL_SOLUTION | Freq: Once | ORAL | Status: AC
  Administered 2023-05-08: 5 mL via ORAL
  Filled 2023-05-08: qty 10

## 2023-05-08 MED ORDER — ENSURE ENLIVE PO LIQD
237.0000 mL | Freq: Two times a day (BID) | ORAL | Status: DC
  Administered 2023-05-09 – 2023-05-13 (×6): 237 mL via ORAL

## 2023-05-08 MED ORDER — CARVEDILOL 12.5 MG PO TABS
12.5000 mg | ORAL_TABLET | Freq: Two times a day (BID) | ORAL | Status: DC
Start: 2023-05-08 — End: 2023-05-13
  Administered 2023-05-08 – 2023-05-13 (×10): 12.5 mg via ORAL
  Filled 2023-05-08 (×10): qty 1

## 2023-05-08 MED ORDER — BENZONATATE 100 MG PO CAPS
200.0000 mg | ORAL_CAPSULE | Freq: Three times a day (TID) | ORAL | Status: DC | PRN
  Administered 2023-05-08 – 2023-05-09 (×2): 200 mg via ORAL
  Filled 2023-05-08 (×2): qty 2

## 2023-05-08 MED ORDER — LIDOCAINE HCL (PF) 1 % IJ SOLN
10.0000 mL | Freq: Once | INTRAMUSCULAR | Status: AC
  Administered 2023-05-08: 10 mL via INTRADERMAL
  Filled 2023-05-08: qty 10

## 2023-05-08 NOTE — Progress Notes (Signed)
PT Cancellation Note  Patient Details Name: Dawn Bruce MRN: [id number] DOB: [birth year]   Cancelled Treatment:    Reason Eval/Treat Not Completed: Patient at procedure or test/unavailable (PT will continue with attempts.)  Donna Bernard, PT, MPT  Ina Homes 05/08/2023, 12:50 PM

## 2023-05-08 NOTE — Progress Notes (Signed)
Pt not in the room- gone for imaging as per RN.  Platelets- improving- 43 from 39. Pt currently on Cipro/daptomycin.   GB

## 2023-05-08 NOTE — Progress Notes (Signed)
[facility]  ROUNDING NOTE   Subjective:   Dawn Bruce  is a 77 y.o. female with medical problems of hypertension, heart failure with preserved ejection fraction, history of pleural effusions, history of diffuse vascular disease including coronary artery disease-history of PCI in LAD stent October 2023, chronic kidney disease, prediabetes, atrial fibrillation status post cardioversion in 2022 followed by Dr. Alyson Locket at [facility] nephrology. Patient presents to the ED with hypotension and suspected wound infection. She has been admitted for Cellulitis of right groin [L03.314] Cellulitis of right thigh [L03.115]  Patient is known to our practice from previous admission.   Patient seen laying in bed Due to coughing, states she did not rest well overnight Oral intake slowly improving  Creatinine 4.08   Objective:  Vital signs in last 24 hours:  Temp:  [98.5 F (36.9 C)-99.2 F (37.3 C)] 98.5 F (36.9 C) (12/17 0753) Pulse Rate:  [95-110] 103 (12/17 1216) Resp:  [16-19] 16 (12/17 1216) BP: (138-195)/(60-103) 147/80 (12/17 1216) SpO2:  [92 %-99 %] 94 % (12/17 1216) Weight:  [63.3 kg] 63.3 kg (12/17 0340)  Weight change:  Filed Weights   05/05/23 0448 05/06/23 0500 05/08/23 0340  Weight: 75.8 kg 75.8 kg 63.3 kg    Intake/Output: I/O last 3 completed shifts: In: 550 [P.O.:550] Out: 2350 [Urine:2350]   Intake/Output this shift:  Total I/O In: 0  Out: 400 [Urine:400]  Physical Exam: General: NAD  Head: Normocephalic, atraumatic. Moist oral mucosal membranes  Eyes: Anicteric  Lungs:  Clear to auscultation.normal effort  Heart: Regular rate and rhythm  Abdomen:  Soft, nontender  Extremities:  No peripheral edema.  Neurologic: Alert and oriented, moving all four extremities  Skin: No lesions       Basic Metabolic Panel: Recent Labs  Lab 05/03/23 0659 05/04/23 0530 05/05/23 0525 05/06/23 0509 05/07/23 0540 05/08/23 0338  NA 137 137 137 137 138 140  K  4.6 4.5 4.6 4.6 4.4 4.3  CL 106 106 106 105 105 108  CO2 21* 22 21* 22 25 24   GLUCOSE 105* 105* 106* 105* 107* 118*  BUN 79* 88* 91* 92* 87* 83*  CREATININE 3.95* 4.56* 4.67* 4.67* 4.46* 4.08*  CALCIUM 8.2* 8.1* 8.3* 8.3* 8.5* 8.4*  MG 2.3 2.2  --   --   --   --   PHOS  --  6.8*  --   --   --   --     Liver Function Tests: No results for input(s): "AST", "ALT", "ALKPHOS", "BILITOT", "PROT", "ALBUMIN" in the last 168 hours.  No results for input(s): "LIPASE", "AMYLASE" in the last 168 hours. No results for input(s): "AMMONIA" in the last 168 hours.  CBC: Recent Labs  Lab 05/04/23 0530 05/06/23 0509 05/06/23 1338 05/07/23 0540 05/08/23 0338  WBC 6.8 6.3 7.2 6.3 7.1  NEUTROABS  --   --  5.4  --   --   HGB 9.5* 9.5* 9.8* 9.0* 8.3*  HCT 29.1* 28.9* 29.8* 27.6* 25.2*  MCV 88.2 86.5 87.9 87.1 87.2  PLT 81* 51* 50* 39* 43*    Cardiac Enzymes: Recent Labs  Lab 05/04/23 0530 05/07/23 1822  CKTOTAL 38 39    BNP: Invalid input(s): "POCBNP"  CBG: No results for input(s): "GLUCAP" in the last 168 hours.   Microbiology: Results for orders placed or performed during the hospital encounter of 04/22/23  Blood Culture (routine x 2)     Status: None   Collection Time: 04/22/23  3:28 PM   Specimen:  BLOOD  Result Value Ref Range Status   Specimen Description BLOOD RIGHT ANTECUBITAL  Final   Special Requests   Final    BOTTLES DRAWN AEROBIC AND ANAEROBIC Blood Culture results may not be optimal due to an excessive volume of blood received in culture bottles   Culture   Final    NO GROWTH 5 DAYS Performed at [facility], [address], [place], [place] [address]    Report Status 04/27/2023 FINAL  Final  C Difficile Quick Screen w PCR reflex     Status: None   Collection Time: 04/22/23  4:19 PM   Specimen: STOOL  Result Value Ref Range Status   C Diff antigen NEGATIVE NEGATIVE Final   C Diff toxin NEGATIVE NEGATIVE Final   C Diff interpretation No C. difficile  detected.  Final    Comment: Performed at [facility], [address] [place], [place] [address]  Gastrointestinal Panel by PCR , Stool     Status: None   Collection Time: 04/22/23  4:25 PM   Specimen: Stool  Result Value Ref Range Status   Campylobacter species NOT DETECTED NOT DETECTED Final   Plesimonas shigelloides NOT DETECTED NOT DETECTED Final   Salmonella species NOT DETECTED NOT DETECTED Final   Yersinia enterocolitica NOT DETECTED NOT DETECTED Final   Vibrio species NOT DETECTED NOT DETECTED Final   Vibrio cholerae NOT DETECTED NOT DETECTED Final   Enteroaggregative E coli (EAEC) NOT DETECTED NOT DETECTED Final   Enteropathogenic E coli (EPEC) NOT DETECTED NOT DETECTED Final   Enterotoxigenic E coli (ETEC) NOT DETECTED NOT DETECTED Final   Shiga like toxin producing E coli (STEC) NOT DETECTED NOT DETECTED Final   Shigella/Enteroinvasive E coli (EIEC) NOT DETECTED NOT DETECTED Final   Cryptosporidium NOT DETECTED NOT DETECTED Final   Cyclospora cayetanensis NOT DETECTED NOT DETECTED Final   Entamoeba histolytica NOT DETECTED NOT DETECTED Final   Giardia lamblia NOT DETECTED NOT DETECTED Final   Adenovirus F40/41 NOT DETECTED NOT DETECTED Final   Astrovirus NOT DETECTED NOT DETECTED Final   Norovirus GI/GII NOT DETECTED NOT DETECTED Final   Rotavirus A NOT DETECTED NOT DETECTED Final   Sapovirus (I, II, IV, and V) NOT DETECTED NOT DETECTED Final    Comment: Performed at [facility], [address] [place], [place] [address]  Blood Culture (routine x 2)     Status: None   Collection Time: 04/23/23  6:31 AM   Specimen: BLOOD  Result Value Ref Range Status   Specimen Description BLOOD BLOOD RIGHT HAND  Final   Special Requests   Final    BOTTLES DRAWN AEROBIC AND ANAEROBIC Blood Culture results may not be optimal due to an inadequate volume of blood received in culture bottles   Culture   Final    NO GROWTH 5 DAYS Performed at [facility],  [address] [place], [place] [address]    Report Status 04/28/2023 FINAL  Final  Aerobic Culture w Gram Stain (superficial specimen)     Status: None   Collection Time: 04/23/23 10:42 AM   Specimen: Groin  Result Value Ref Range Status   Specimen Description   Final    GROIN Performed at [facility], [address], [place], [place] [address]    Special Requests   Final    NONE Performed at [facility], [address], [place], [place] [address]    Gram Stain   Final    RARE WBC  PRESENT, PREDOMINANTLY PMN RARE GRAM NEGATIVE RODS RARE GRAM NEGATIVE COCCOBACILLI Performed at [facility], [address], [place], [place] [address]    Culture   Final    MODERATE MORGANELLA MORGANII FEW ESCHERICHIA COLI FEW PROTEUS MIRABILIS    Report Status 04/26/2023 FINAL  Final   Organism ID, Bacteria ESCHERICHIA COLI  Final   Organism ID, Bacteria MORGANELLA MORGANII  Final   Organism ID, Bacteria PROTEUS MIRABILIS  Final      Susceptibility   Escherichia coli - MIC*    AMPICILLIN >=32 RESISTANT Resistant     CEFEPIME <=0.12 SENSITIVE Sensitive     CEFTAZIDIME <=1 SENSITIVE Sensitive     CEFTRIAXONE <=0.25 SENSITIVE Sensitive     CIPROFLOXACIN <=0.25 SENSITIVE Sensitive     GENTAMICIN <=1 SENSITIVE Sensitive     IMIPENEM <=0.25 SENSITIVE Sensitive     TRIMETH/SULFA <=20 SENSITIVE Sensitive     AMPICILLIN/SULBACTAM 16 INTERMEDIATE Intermediate     PIP/TAZO <=4 SENSITIVE Sensitive ug/mL    * FEW ESCHERICHIA COLI   Morganella morganii - MIC*    AMPICILLIN >=32 RESISTANT Resistant     CEFTAZIDIME <=1 SENSITIVE Sensitive     CIPROFLOXACIN <=0.25 SENSITIVE Sensitive     GENTAMICIN <=1 SENSITIVE Sensitive     IMIPENEM 4 SENSITIVE Sensitive     TRIMETH/SULFA <=20 SENSITIVE Sensitive     AMPICILLIN/SULBACTAM 16 INTERMEDIATE Intermediate     PIP/TAZO <=4 SENSITIVE Sensitive ug/mL    * MODERATE MORGANELLA MORGANII   Proteus mirabilis - MIC*    AMPICILLIN  <=2 SENSITIVE Sensitive     CEFEPIME <=0.12 SENSITIVE Sensitive     CEFTAZIDIME <=1 SENSITIVE Sensitive     CEFTRIAXONE <=0.25 SENSITIVE Sensitive     CIPROFLOXACIN <=0.25 SENSITIVE Sensitive     GENTAMICIN <=1 SENSITIVE Sensitive     IMIPENEM 2 SENSITIVE Sensitive     TRIMETH/SULFA <=20 SENSITIVE Sensitive     AMPICILLIN/SULBACTAM <=2 SENSITIVE Sensitive     PIP/TAZO <=4 SENSITIVE Sensitive ug/mL    * FEW PROTEUS MIRABILIS  Aerobic/Anaerobic Culture w Gram Stain (surgical/deep wound)     Status: None   Collection Time: 04/25/23  2:41 PM   Specimen: Wound; Body Fluid  Result Value Ref Range Status   Specimen Description   Final    WOUND Performed at [facility], [address] [place], [place] [address]    Special Requests RETROPERITONEAL HEMATOMA SPEC A  Final   Gram Stain   Final    FEW WBC PRESENT,BOTH PMN AND MONONUCLEAR NO ORGANISMS SEEN    Culture   Final    RARE ENTEROCOCCUS FAECALIS NO ANAEROBES ISOLATED Performed at [facility], [address], [place], [place] [address]    Report Status 05/02/2023 FINAL  Final   Organism ID, Bacteria ENTEROCOCCUS FAECALIS  Final      Susceptibility   Enterococcus faecalis - MIC*    AMPICILLIN <=2 SENSITIVE Sensitive     VANCOMYCIN 2 SENSITIVE Sensitive     GENTAMICIN SYNERGY SENSITIVE Sensitive     * RARE ENTEROCOCCUS FAECALIS    Coagulation Studies: No results for input(s): "LABPROT", "INR" in the last 72 hours.  Urinalysis: No results for input(s): "COLORURINE", "LABSPEC", "PHURINE", "GLUCOSEU", "HGBUR", "BILIRUBINUR", "KETONESUR", "PROTEINUR", "UROBILINOGEN", "NITRITE", "LEUKOCYTESUR" in the last 72 hours.  Invalid input(s): "APPERANCEUR"    Imaging: DG Chest Port 1 View Result Date: 05/08/2023 CLINICAL DATA:  Status post right thoracentesis. EXAM: PORTABLE CHEST 1 VIEW COMPARISON:  05/07/2023 FINDINGS: Improved aeration at the  right lung base compatible with recent thoracentesis. Negative for a  pneumothorax. Markedly increased densities at the left lung base compared to 05/07/2023 raises concern for re-accumulation of left pleural fluid although cannot exclude airspace disease or volume loss in this area. Heart size is within normal limits and stable. Right arm PICC line tip is in the SVC region. IMPRESSION: 1. Improved aeration at the right lung base compatible with recent thoracentesis. Negative for a pneumothorax. 2. Increased densities at the left lung base. Findings likely represent re-accumulation of pleural fluid and atelectasis at the left lung base. Electronically Signed   By: Richarda Overlie M.D.   On: 05/08/2023 13:21   US THORACENTESIS ASP PLEURAL SPACE W/IMG GUIDE Result Date: 05/08/2023 INDICATION: Patient with congestive heart failure, with a right-sided pleural effusion. Request for therapeutic thoracentesis. EXAM: ULTRASOUND GUIDED THERAPEUTIC THORACENTESIS MEDICATIONS: 8 ml of 1% lidocaine COMPLICATIONS: None immediate. PROCEDURE: An ultrasound guided thoracentesis was thoroughly discussed with the patient and questions answered. The benefits, risks, alternatives and complications were also discussed. The patient understands and wishes to proceed with the procedure. Written consent was obtained. Ultrasound was performed to localize and mark an adequate pocket of fluid in the right chest. The area was then prepped and draped in the normal sterile fashion. 1% Lidocaine was used for local anesthesia. Under ultrasound guidance a 6 Fr Safe-T-Centesis catheter was introduced. Thoracentesis was performed. The catheter was removed and a dressing applied. FINDINGS: A total of approximately 550 cc of dark amber pleural fluid was removed. IMPRESSION: Successful ultrasound guided right thoracentesis yielding 550 cc of pleural fluid. Performed by Buzzy Han, PA-C Electronically Signed   By: Richarda Overlie M.D.   On: 05/08/2023 13:15   DG Chest Port 1 View Result Date: 05/07/2023 CLINICAL DATA:  Post  thoracentesis EXAM: PORTABLE CHEST 1 VIEW COMPARISON:  05/06/2023 FINDINGS: Decreased left pleural effusion post thoracentesis. No definitive pneumothorax. Small residual left effusion. Increased now moderate appearing right pleural effusion. Stable cardiomediastinal silhouette with aortic atherosclerosis and vascular congestion. Right upper extremity central venous catheter tip at the SVC. IMPRESSION: 1. Decreased left pleural effusion post thoracentesis. No definitive pneumothorax. 2. Increased now moderate appearing right pleural effusion. Small residual left effusion Electronically Signed   By: Jasmine Pang M.D.   On: 05/07/2023 18:01   US THORACENTESIS ASP PLEURAL SPACE W/IMG GUIDE Result Date: 05/07/2023 INDICATION: Patient with congestive heart failure, with left pleural effusion. Request for therapeutic thoracentesis. EXAM: ULTRASOUND GUIDED THERAPEUTIC THORACENTESIS MEDICATIONS: 9 cc of 1% lidocaine COMPLICATIONS: None immediate. PROCEDURE: An ultrasound guided thoracentesis was thoroughly discussed with the patient and questions answered. The benefits, risks, alternatives and complications were also discussed. The patient understands and wishes to proceed with the procedure. Written consent was obtained. Ultrasound was performed to localize and mark an adequate pocket of fluid in the left chest. The area was then prepped and draped in the normal sterile fashion. 1% Lidocaine was used for local anesthesia. Under ultrasound guidance a 6 Fr Safe-T-Centesis catheter was introduced. Thoracentesis was performed. The catheter was removed and a dressing applied. FINDINGS: A total of approximately 1.5 L of straw-colored pleural fluid was removed. IMPRESSION: Successful ultrasound guided left thoracentesis yielding 1.5 L of pleural fluid. Performed by Buzzy Han, PA-C Electronically Signed   By: Judie Petit.  Shick M.D.   On: 05/07/2023 16:28     Medications:    DAPTOmycin 600 mg (05/07/23 1815)    alum & mag  hydroxide-simeth  15 mL Oral Once   apixaban  2.5 mg Oral BID   aspirin EC  81 mg Oral Daily   atorvastatin  40 mg Oral Daily   carvedilol  12.5 mg Oral BID WC   Chlorhexidine Gluconate Cloth  6 each Topical Daily   ciprofloxacin  500 mg Oral Q breakfast   clopidogrel  75 mg Oral Daily   ezetimibe  10 mg Oral Daily   fentaNYL  1 patch Transdermal Q72H   pregabalin  75 mg Oral Daily   sodium chloride flush  10-40 mL Intracatheter Q12H   traZODone  50 mg Oral QHS   acetaminophen, albuterol, benzonatate, oxyCODONE, phenylephrine, sodium chloride flush, witch hazel-glycerin  Assessment/ Plan:  Ms. Paolina Kukulski is a 77 y.o.  female with medical problems of hypertension, heart failure with preserved ejection fraction, history of pleural effusions, history of diffuse vascular disease including coronary artery disease-history of PCI in LAD stent October 2023, chronic kidney disease, prediabetes, atrial fibrillation status post cardioversion in 2022 followed by Dr. Alyson Locket at [facility] nephrology. Patient presents to the ED with hypotension and suspected wound infection. She has been admitted for Cellulitis of right groin [L03.314] Cellulitis of right thigh [L03.115]   Acute Kidney Injury on chronic kidney disease stage IIIb with baseline creatinine 1.3 and GFR of 42 on 03/22/23.  Acute kidney injury appears multifactorial, hypotension, IV contrast exposure, and diuretics. Creatinine on admission, 1.17.  Patient experienced hypertension with subsequent hypotension.  IV contrast exposure on 04/22/2023.  Received approximately 2 days of diuresis.    Renal function improving. Oral intake slowly improving. Adequate urine output noted, 1L. Will continue to monitor. No acute indication for dialysis.   Lab Results  Component Value Date   CREATININE 4.08 (H) 05/08/2023   CREATININE 4.46 (H) 05/07/2023   CREATININE 4.67 (H) 05/06/2023    Intake/Output Summary (Last 24 hours) at 05/08/2023 1448 Last data  filed at 05/08/2023 1434 Gross per 24 hour  Intake 550 ml  Output 1450 ml  Net -900 ml   2. Anemia of chronic kidney disease Lab Results  Component Value Date   HGB 8.3 (L) 05/08/2023    Hgb decreased today. Will monitor  3.  Hypertension, home regimen includes carvedilol, isosorbide, and valsartan.  Currently receiving carvedilol only. Blood pressure 147/80 today   LOS: 16 Tabby Beaston 12/17/20242:48 PM

## 2023-05-08 NOTE — Plan of Care (Signed)
  Problem: Clinical Measurements: Goal: Diagnostic test results will improve Outcome: Progressing Goal: Respiratory complications will improve Outcome: Progressing   

## 2023-05-08 NOTE — Plan of Care (Signed)

## 2023-05-08 NOTE — Progress Notes (Signed)
Mobility Specialist - Progress Note   05/08/23 1635  Mobility  Activity Transferred to/from [facility];Ambulated with assistance in hallway  Level of Assistance Standby assist, set-up cues, supervision of patient - no hands on  Assistive Device Front wheel walker  Distance Ambulated (ft) 160 ft  Activity Response Tolerated well  Mobility visit 1 Mobility  Mobility Specialist Start Time (ACUTE ONLY) 1536  Mobility Specialist Stop Time (ACUTE ONLY) 1540  Mobility Specialist Time Calculation (min) (ACUTE ONLY) 4 min   Pt supine upon entry, utilizing RA. Pt motivated and agreeable to OOB amb this date. Pt completed bed mob ModI, STS and transferred to the [facility] before amb one lap around the NS MinG-SBA. Pt reported fatigue upon returning to the room, denies SOB. Pt left supine with alarm set and needs within reach.   Betsy Flakes Mobility Specialist 05/08/23 4:39 PM

## 2023-05-08 NOTE — Progress Notes (Signed)
PULMONOLOGY         Date: 05/08/2023,   MRN# [id number] Dawn Bruce 1945-10-07     AdmissionWeight: 54.4 kg                 CurrentWeight: 63.3 kg  Referring provider: Dr Gilda Crease   CHIEF COMPLAINT:   Complete atelectasis of left lung with bilateral pleural effusions.    HISTORY OF PRESENT ILLNESS   77 year old female with a history of chronic GERD, basal cell carcinoma squamous cell carcinoma history of NSTEMI diastolic CHF, CAD status post recent PCI with DES, chronic A-fib on Eliquis, essential hypertension, dyslipidemia CKD 3A chronic pain syndrome remote perforated appendicitis, hospitalized November 2024 with non-STEMI.  Post left heart cath had developed retroperitoneal hematoma and hemorrhagic shock with complications of iliac artery laceration.  Status post surgical repair via vascular surgery service.  Had outpatient therapy with antibiotics with failure came in with transient hypotension and circulatory shock.  Found to have elevated cardiac biomarkers and acute on chronic CHF.  Vascular surgery was reconsulted today and PCCM consultation was placed to incidental finding of complete atelectasis of the left lung with surrounding bilateral pleural effusions worse on the left.  04/25/23- patient evaluated in PACU post op , appears to be mildy sedated still. Mildy hypotensive at this moment.  Lung sounds much improved post 1.6L left pleural space aspiration.  04/26/23- patient seen and examined , Dawn Bruce (daughter) at bedside.  Patient receiving blood transfusion.  Lung sounds are clear , vitals are stable.  Hypoalbulminemia continues. Will order urine protein and pre albumin levels,  because patient shares she's eating well.  CXR in am to check interval effusion.  04/27/23- patient seen at bedside, husband and daughter present.  For surgery wound washout and vac exchange today.   Vitals stable , room air spO2 99%     04/30/23- patient seen at bedside.  Has  additional surgery planned with debridement.  CXR with recurring effusion and associated compressive atelelctasis.   Ideally would like to increase diuresis and recruitment maunevers.  E. Faecallis noted on wound culture.  I discussed case with Dr Avon Gully infectious disease specialist today.  Dr Dareen Piano is also considering pleurX catheter which patient may need if this does not resolve with medical management. She already had multiple thoracentesis. No noew labs are available for review but are in process. Will order purewick for urination due to physical weakness.      05/01/23-  patient reports improvement post diuresis.  Will obtain interval CXR today.  We discussed PleurX permanent tunneled catheter and she declined to have this done at the moment.  She is on room air with normoxia.  She developed mild contraction and we will take diuretic holiday.   05/02/23- patient with severe hypotension.  I have dcd her beta blocker today and imdur. She is now getting IV fluids and is able to speak.  Will start midodrine 5mg  po tid.  05/03/23- patient is now no longer hypotensive.  She had AKI and this is very likely related to circulatory shock over past 24h.  She had fluid via IV and I'm concerned regarding reacculumation of pleural fluid so I have dcd this now since she is hypertensive.  There is nephrology consult in progress.  05/04/23- patient is improved clinically, she has improved vital signs,  she does have AKI but she is making light yellow urine.  She has nephrology evaluation.  There is consideration for HD.  05/06/23- patient clinically  reporting mildy worsening SOB.  Repeat CXR reviewed by me with improved right hemithorax but worse on left with reaccumulated effusion moderate.  She has progressive thrombocytopenia with today platelets <51, does not appear toxic at all and is lucid alert and appropriate.  She has PCI x2 in the past.  Patient is uremic and am concerned regarding potential for  bleeding but same time concern for stent thrombosis/clotting with AF/CAD.  Have discussed with team and will ask for additional evaluation with cardiology.  Dr Chipper Herb has also notified oncology to help with blood abnormalities.   05/07/23- patient continues to produce adequate urine light yellow in color renal function is improved, respiratory status however seems  more labored today.  Her pleural effusion has re-accumulated and we will order US thoracentesis today. She had oncology and cardiology evaluation.  It seems thrombocytopenia may be due to Zosyn.   She had vascular surgery evaluation today.   05/08/23- patient seen at bedside.  She reports sudden onset of cough after eating yesterday, suspect due to aspiration.  She had some nuts while laying in bed. We discussed this and asked to perform aspiration precautions with meals. She had Left thoracentesis with improved CXR post procedure.  Today we plan to drain right pleural space. She has improved renal function and is urinating adequately.  She has multiple specialists following her care, appreciate everyone involved.  Her vitals appear stable.  Platelet counts have improved.  She is ambulatory with PT/OT.  Overall she is clinically improved.    PAST MEDICAL HISTORY   Past Medical History:  Diagnosis Date   Actinic keratosis    Chronic pain    GERD (gastroesophageal reflux disease) ?   Taking Pepcid   History of basal cell carcinoma (BCC) 05/02/2021   right upper back paraspinal   History of SCC (squamous cell carcinoma) of skin 09/10/2019   left dorsum wrist ED&C done 10/28/19   History of SCC (squamous cell carcinoma) of skin 03/04/2020   right dorsum hand   History of squamous cell carcinoma in situ (SCCIS) 03/04/2020   left dorsum wrist  ED&C   History of squamous cell carcinoma in situ (SCCIS) 03/04/2020   right medial infraorbital  ED&C 04/28/2020   History of squamous cell carcinoma in situ (SCCIS) 10/28/2019   right dorsum hand  proximal lateral   History of squamous cell carcinoma in situ (SCCIS) 10/28/2019   right dorsum hand proximal medial   Hyperlipidemia    Hypertension    NSTEMI (non-ST elevated myocardial infarction) (HCC) 04/12/2023   Renal disorder    Squamous cell carcinoma in situ 10/31/2021   left distal  tricep, EDC   Squamous cell carcinoma of skin 08/28/2022   Right volar forearm - [facility]     SURGICAL HISTORY   Past Surgical History:  Procedure Laterality Date   ABDOMINAL HYSTERECTOMY     APPENDECTOMY  April 2023   Burst appendix   APPLICATION OF WOUND VAC Right 04/25/2023   Procedure: APPLICATION OF WOUND VAC;  Surgeon: Renford Dills, MD;  Location: [facility];  Service: Vascular;  Laterality: Right;   APPLICATION OF WOUND VAC Right 04/27/2023   Procedure: WOUND VAC EXCHANGE;  Surgeon: Renford Dills, MD;  Location: [facility];  Service: Vascular;  Laterality: Right;   APPLICATION OF WOUND VAC Right 05/01/2023   Procedure: APPLICATION OF WOUND VAC;  Surgeon: Renford Dills, MD;  Location: [facility];  Service: Vascular;  Laterality: Right;   AUGMENTATION MAMMAPLASTY Bilateral  25-30 years ago   CARDIOVERSION N/A 12/01/2020   Procedure: CARDIOVERSION;  Surgeon: Debbe Odea, MD;  Location: [facility];  Service: Cardiovascular;  Laterality: N/A;   CAROTID ARTERY ANGIOPLASTY Right 1995(approximate)   CAROTID ENDARTERECTOMY  2001   CORONARY STENT INTERVENTION N/A 04/13/2023   Procedure: CORONARY STENT INTERVENTION;  Surgeon: Marcina Millard, MD;  Location: [facility];  Service: Cardiovascular;  Laterality: N/A;   COSMETIC SURGERY     ELBOW SURGERY Left    ENDARTERECTOMY FEMORAL Right 04/13/2023   Procedure: Right groin exploration with repair of right external iliac artery and right circumflex artery;  Surgeon: Bertram Denver, MD;  Location: [facility];  Service: Vascular;  Laterality: Right;   FRACTURE SURGERY  2016   Arm   INCISION AND DRAINAGE OF WOUND Right  04/25/2023   Procedure: IRRIGATION AND DEBRIDEMENT WOUND;  Surgeon: Renford Dills, MD;  Location: [facility];  Service: Vascular;  Laterality: Right;   INCISION AND DRAINAGE OF WOUND Right 04/27/2023   Procedure: IRRIGATION AND DEBRIDEMENT WOUND;  Surgeon: Renford Dills, MD;  Location: [facility];  Service: Vascular;  Laterality: Right;   INCISION AND DRAINAGE OF WOUND Right 05/01/2023   Procedure: IRRIGATION AND DEBRIDEMENT WOUND;  Surgeon: Renford Dills, MD;  Location: [facility];  Service: Vascular;  Laterality: Right;   LEFT HEART CATH AND CORONARY ANGIOGRAPHY N/A 04/13/2023   Procedure: LEFT HEART CATH AND CORONARY ANGIOGRAPHY;  Surgeon: Marcina Millard, MD;  Location: [facility];  Service: Cardiovascular;  Laterality: N/A;   THORACENTESIS N/A 12/27/2020   Procedure: Alanson Puls;  Surgeon: Josephine Igo, DO;  Location: [facility];  Service: Pulmonary;  Laterality: N/A;   TONSILLECTOMY     TUBAL LIGATION  ?     FAMILY HISTORY   Family History  Problem Relation Age of Onset   Cancer Mother    Cancer Father    Cancer Sister    Breast cancer Neg Hx      SOCIAL HISTORY   Social History   Tobacco Use   Smoking status: Former    Current packs/day: 0.00    Average packs/day: 0.3 packs/day for 15.0 years (3.8 ttl pk-yrs)    Types: Cigarettes    Start date: 85    Quit date: 15    Years since quitting: 34.9    Passive exposure: Past   Smokeless tobacco: Never   Tobacco comments:    quit around 1990-1995 (?)  Vaping Use   Vaping status: Never Used  Substance Use Topics   Alcohol use: Yes    Alcohol/week: 14.0 standard drinks of alcohol    Types: 14 Standard drinks or equivalent per week    Comment: 2 scotch shots   Drug use: Never     MEDICATIONS    Home Medication:     Current Medication:  Current Facility-Administered Medications:    acetaminophen (TYLENOL) tablet 650 mg, 650 mg, Oral, Q6H PRN, Schnier, Latina Craver, MD, 650 mg at  05/05/23 1809   albuterol (PROVENTIL) (2.5 MG/3ML) 0.083% nebulizer solution 2.5 mg, 2.5 mg, Nebulization, Q4H PRN, Schnier, Latina Craver, MD, 2.5 mg at 05/08/23 0716   alum & mag hydroxide-simeth (MAALOX/MYLANTA) 200-200-20 MG/5ML suspension 15 mL, 15 mL, Oral, Once, Schnier, Latina Craver, MD   apixaban Everlene Balls) tablet 2.5 mg, 2.5 mg, Oral, BID, Schnier, Latina Craver, MD, 2.5 mg at 05/07/23 2148   aspirin EC tablet 81 mg, 81 mg, Oral, Daily, Schnier, Latina Craver, MD, 81 mg at 05/07/23 [phone]  atorvastatin (LIPITOR) tablet 40 mg, 40 mg, Oral, Daily, Karna Christmas, Julianah Marciel, MD, 40 mg at 05/07/23 0831   carvedilol (COREG) tablet 6.25 mg, 6.25 mg, Oral, BID WC, Marrion Coy, MD, 6.25 mg at 05/07/23 1801   Chlorhexidine Gluconate Cloth 2 % PADS 6 each, 6 each, Topical, Daily, Schnier, Latina Craver, MD, 6 each at 05/07/23 [phone]   ciprofloxacin (CIPRO) tablet 500 mg, 500 mg, Oral, Q breakfast, Marrion Coy, MD, 500 mg at 05/07/23 0831   clopidogrel (PLAVIX) tablet 75 mg, 75 mg, Oral, Daily, Schnier, Latina Craver, MD, 75 mg at 05/07/23 0831   DAPTOmycin (CUBICIN) 600 mg in sodium chloride 0.9 % IVPB, 8 mg/kg, Intravenous, Q48H, Aleda Grana, RPH, Last Rate: 124 mL/hr at 05/07/23 1815, 600 mg at 05/07/23 1815   ezetimibe (ZETIA) tablet 10 mg, 10 mg, Oral, Daily, Schnier, Latina Craver, MD, 10 mg at 05/07/23 0831   fentaNYL (DURAGESIC) 75 MCG/HR 1 patch, 1 patch, Transdermal, Q72H, Schnier, Latina Craver, MD, 1 patch at 05/05/23 1341   oxyCODONE (Oxy IR/ROXICODONE) immediate release tablet 5 mg, 5 mg, Oral, Q6H PRN, Schnier, Latina Craver, MD, 5 mg at 05/04/23 0008   phenylephrine ((USE for PREPARATION-H)) 0.25 % suppository 1 suppository, 1 suppository, Rectal, PRN, Schnier, Latina Craver, MD   pregabalin (LYRICA) capsule 75 mg, 75 mg, Oral, Daily, Marrion Coy, MD, 75 mg at 05/07/23 0831   sodium chloride flush (NS) 0.9 % injection 10-40 mL, 10-40 mL, Intracatheter, Q12H, Schnier, Latina Craver, MD, 10 mL at 05/07/23 2148   sodium chloride flush  (NS) 0.9 % injection 10-40 mL, 10-40 mL, Intracatheter, PRN, Schnier, Latina Craver, MD   traZODone (DESYREL) tablet 50 mg, 50 mg, Oral, QHS, Schnier, Latina Craver, MD, 50 mg at 05/07/23 2148   witch hazel-glycerin (TUCKS) pad, , Topical, PRN, Marrion Coy, MD    ALLERGIES   Patient has no known allergies.     REVIEW OF SYSTEMS    Review of Systems:  Gen:  Denies  fever, sweats, chills weigh loss  HEENT: Denies blurred vision, double vision, ear pain, eye pain, hearing loss, nose bleeds, sore throat Cardiac:  No dizziness, chest pain or heaviness, chest tightness,edema Resp:   reports dyspnea chronically , REPORTS ASPIRATION OVERNIGHT WHILE EATING NUTS IN SUPINE POSITION Gi: Denies swallowing difficulty, stomach pain, nausea or vomiting, diarrhea, constipation, bowel incontinence Gu:  Denies bladder incontinence, burning urine Ext:   Denies Joint pain, stiffness or swelling Skin: Denies  skin rash, easy bruising or bleeding or hives Endoc:  Denies polyuria, polydipsia , polyphagia or weight change Psych:   Denies depression, insomnia or hallucinations   Other:  All other systems negative   VS: BP (!) 158/61 (BP Location: Right Arm)   Pulse (!) 103   Temp 98.6 F (37 C) (Oral)   Resp 18   Ht 5' 2.99" (1.6 m)   Wt 63.3 kg   SpO2 95%   BMI 24.73 kg/m      PHYSICAL EXAM    GENERAL:NAD, no fevers, chills, no weakness no fatigue HEAD: Normocephalic, atraumatic.  EYES: Pupils equal, round, reactive to light. Extraocular muscles intact. No scleral icterus.  MOUTH: Moist mucosal membrane. Dentition intact. No abscess noted.  EAR, NOSE, THROAT: Clear without exudates. No external lesions.  NECK: Supple. No thyromegaly. No nodules. No JVD.  PULMONARY: decreased breath sounds with mild rhonchi worse at bases bilaterally.  CARDIOVASCULAR: S1 and S2. Regular rate and rhythm. No murmurs, rubs, or gallops. No edema. Pedal pulses 2+ bilaterally.  GASTROINTESTINAL:  Soft, nontender,  nondistended. No masses. Positive bowel sounds. No hepatosplenomegaly.  MUSCULOSKELETAL: No swelling, clubbing, or edema. Range of motion full in all extremities.  NEUROLOGIC: Cranial nerves II through XII are intact. No gross focal neurological deficits. Sensation intact. Reflexes intact.  SKIN: No ulceration, lesions, rashes, or cyanosis. Skin warm and dry. Turgor intact.  PSYCHIATRIC: Mood, affect within normal limits. The patient is awake, alert and oriented x 3. Insight, judgment intact.       IMAGING     ASSESSMENT/PLAN    Complete atelectasis of left lung- IMPROVED    - patient s/p diuresis with partial improvement however still has minimal lung sounds on left.   - she does have CKD 3 and may not produce adequate fluid removal via medication alone   - we discussed options for thoracentesis and patient would like to proceed with this procedure.  -incentive spirometry at bedside to be used multiple times daily  - PT/OT - etiology is likely poor nutrition post ICU hospitalization with CHF and CKD all contributing to third spaced interstitial edema , peripheral edema and effusion -repeat thoracentesis 12/15,  have ordered again thoracentesis on right    Bilateral pleural effusions    - s/p throacentesis- multiple times   - consultation for IR with US guided throacentesis - therapeutic    -patient on eliquis -prealbumin -urine protein -holding diuresis today due to [facility] and will continue to monitor renal function.  I anticipate this should improve with normal BP now. -appreciate nephrology consultation  -05/06/23- CXR with reaccumuation of left effusion - treated with thoracentesis 12/17- ordered right thoracentesis  Severe AKI stage 5    Nephrology on case -appreciate input     - non oliguric at this moment , no HD today per specialist    AF with CAD s/p PCI x 2   - patient on plavix and eliquis     - s/p complicated surgery with multiple transfusions.     -Thrombocytopenia improved    Severe thrombocytopenia   - oncology on case - appreciate input - Dr B   - workup in progress - platelet count trending up   Sepsis due to right groin cellulitis   S/p vascular surgery eval S/p vanco /rocephin  - patient appears to be clinically improved.  - s/p ID revaluation - appreciate input  -E faecalis noted on wound care   Moderate protein calorie malnutrition   - nutritional consultation    - bitemporal wasting and peripheral muscle weakness noted   - contributing to edema/effusion   - complicating tissue healing  -high protein diet --patient aspirated on evening of 05/07/23  Thank you for allowing me to participate in the care of this patient.  Patient/Family are satisfied with care plan and all questions have been answered.    Provider disclosure: Patient with at least one acute or chronic illness or injury that poses a threat to life or bodily function and is being managed actively during this encounter.  All of the below services have been performed independently by signing provider:  review of prior documentation from internal and or external health records.  Review of previous and current lab results.  Interview and comprehensive assessment during patient visit today. Review of current and previous chest radiographs/CT scans. Discussion of management and test interpretation with health care team and patient/family.   This document was prepared using Dragon voice recognition software and may include unintentional dictation errors.     Vida Rigger, M.D.  Division  of Pulmonary & Critical Care Medicine

## 2023-05-08 NOTE — Procedures (Signed)
PROCEDURE SUMMARY:  Successful image-guided right thoracentesis. Yielded 550 cc of dark amber pleural fluid. Patient tolerated procedure well. EBL: Zero No immediate complications.  Specimen was not sent for labs. Post procedure CXR shows no pneumothorax.  Please see imaging section of Epic for full dictation.  Chrstopher Malenfant A Calven Gilkes PA-C 05/08/2023 1:12 PM

## 2023-05-08 NOTE — Progress Notes (Signed)
Progress Note   Patient: Dawn Bruce:[id number] DOB: [birth year] DOA: 04/22/2023     16 DOS: the patient was seen and examined on 05/08/2023   Brief hospital course: Dawn Bruce is a 77 y.o. female with medical history significant of dCHF, CAD (s/p of recent DES), A fib on Eliquis, HTN, HLD, CDK-3a, chronic pain syndrome, multiple skin cancer, perforated appendicitis, former smoker, who presents with right groin pain.  Patient was recently hospitalized from 11/21 - 11/29 due to non-STEMI. Patient is s/p of LHC with DES placement. Pt developed retroperitoneal hematoma & hemorrhagic shock due to complication of Iliac artery laceration during left heart cath of right groin. Pt underwent surgical repair of external iliac artery by VVS on 11/23.  Presented back to ED 12/1 due to increased redness, swelling, and pain at incision site of the cath. Failed outpatient Abx x1 dose.  Hypotensive to 80/50 on presentation. 12/2: pulmonology, and IR consulted to address pleural effusion. ID consulted to address abx course. Transitioned to linezolid. Vascular evaluated and recommended debridement. 12/3: Thoracentesis removed 1.6L left side 12/4:stable ORA, afebrile. Wound clinically greatly improved. Debridement today. Plan to stop heparin gtt and return to eliquis + DAPT s/p procedure 12/6: additional debridement.  Debridement again 12/10. Now on zosyn. Doing well. Respiratory status stable ORA.  12/12.  Patient had a worsening renal function, nephrology consult obtained. 12/14.  Worsening thrombocytopenia, consult hematology oncology to rule out TTS. 12/15.  Thoracentesis removed 1.5 Lon left   Principal Problem:   Cellulitis of right groin Active Problems:   Sepsis (HCC)   CAD (coronary artery disease)   Peritoneal hematoma   Essential hypertension   Chronic diastolic heart failure (HCC)   Hypokalemia   Chronic kidney disease, stage 3a (HCC)   Pleural effusion   Abnormal LFTs   Atrial  fibrillation, chronic (HCC)   Diarrhea   Chronic pain syndrome   HLD (hyperlipidemia)   HFrEF (heart failure with reduced ejection fraction) (HCC)   Severe sepsis (HCC)   Infected wound   Cellulitis of right thigh   Acute kidney injury superimposed on stage 3a chronic kidney disease (HCC)   Metabolic acidosis   Overweight (BMI 25.0-29.9)   Thrombocytopenia (HCC)   S/P thoracentesis   Assessment and Plan: Acute renal failure on chronic kidney disease stage IIIa. Hypokalemia. Mild metabolic acidosis. Transient hypotension. Patient has much worsening renal function with creatinine increased to 2.78 from 1.08 on 12/5.  This could be secondary to diuretics.  Patient also had an episode of hypotension yesterday, received a 500 mL normal saline bolus.  Blood pressure medicine was discontinued. Worsening renal function with creatinine went up to 3.9 11/12.  Reviewed prior renal ultrasound performed 11/24, no hydronephrosis.  Consulted nephrology.   Renal function finally improving, creatinine today is 4.08.     Sepsis due to cellulitis of right groin Right groin wound infection secondary to surgical repair of pseudoaneurysm. Sepsis criteria met on admission with WBC 21.3, heart rate 104, RR 29.  Lactic acid normal 1.7.   Sepsis appears to be secondary to right groin surgical wound infection.  Patient had debridement x 2, wound-vac in place Wound culture grow E. coli, Morganella morganii, Proteus Dawn Bruce, Enterococcus faecalis.  Blood culture negative.  Initially treated with linezolid + cefepime, switched to Zosyn on 12/11.  Dressing change performed by vascular surgery 12/12, wound is getting better with significant granulation. Discussed with vascular surgery and ID, will continue IV antibiotics while in the hospital.  Due to severe thrombocytopenia,  most likely caused by Zosyn.   Antibiotic switched to Cipro and daptomycin per ID. Patient had a dressing changes performed 12/16, wound  healing well.   Hypervolemia  Bilateral Pleural effusions Acute on chronic systolic congestive heart failure.  Hx of CAD: s/p of recent DES. Patient initially received IV Lasix, also had a thoracentesis. Volume status has improved. Discussed with cardiology, will continue Plavix, not able to discontinue due to recent drug-eluting stent. Repeated chest x-ray on 12/15 showed moderate pleural effusion.  Pulmonology is following. Thoracentesis removed 1.5 L of fluids on 12/16 on left, paracentesis repeated for right today.   Peritoneal hematoma Anemia acute on chronic. Thrombocytopenia. Hemoglobin dropped down to 7.6 12/11, received another unit of PRBC transfusion.  Normal iron, B12 level borderline, check homocystine level normal.  Hemoglobin much better. Patient developed mild thrombocytopenia of 117 on 12/12, not currently on heparin.   Patient relates has continued to drop, to 39 today.  Has been seen by hematology, thought this is due to Zosyn.  No evidence of TTTS.  No active bleeding.  Zosyn discontinued. Dawn Bruce started improving.  Choking episode. Patient had a choking episode the night of 12/16, appear has improved today. Patient does not have a baseline dysphagia.  Continue to follow.  Essential hypertension: Blood pressure medicine has been on hold, however blood pressure still running high again.  Restarted lower dose Coreg, titrate up dose to 12.5mg .  Hold ARB due to acute renal failure.   Atrial fibrillation, chronic (HCC): rate controlled On eliquis Increased Coreg to 12.5 mg which is home dose.   Diarrhea: C. difficile negative. Diarrhea improved after giving Imodium.   Chronic pain syndrome -Continue home fentanyl patch and Lyrica   HLD: -lipitor and zetia       Subjective:  Patient choked the last night, was placed on oxygen.  Still has some short of breath today, nonproductive cough.  Physical Exam: Vitals:   05/07/23 2125 05/08/23 0340 05/08/23  0718 05/08/23 0753  BP:  (!) 158/61  (!) 147/60  Pulse:  (!) 103  (!) 105  Resp:  18  16  Temp:  98.6 F (37 C)  98.5 F (36.9 C)  TempSrc:  Oral  Oral  SpO2: 99% 98% 95% 93%  Weight:  63.3 kg    Height:       General exam: Appears calm and comfortable  Respiratory system: Decreased breath sounds on right, no crackles. Respiratory effort normal. Cardiovascular system: Irregularly irregular. No JVD, murmurs, rubs, gallops or clicks. No pedal edema. Gastrointestinal system: Abdomen is nondistended, soft and nontender. No organomegaly or masses felt. Normal bowel sounds heard. Central nervous system: Alert and oriented. No focal neurological deficits. Extremities: Symmetric 5 x 5 power. Skin: No rashes, lesions or ulcers Psychiatry: Judgement and insight appear normal. Mood & affect appropriate.    Data Reviewed:  Lab results reviewed.  Family Communication: Husband updated at bedside.  Disposition: Status is: Inpatient Remains inpatient appropriate because: Severity of disease.     Time spent: 35 minutes  Author: Marrion Coy, MD 05/08/2023 11:55 AM  For on call review [email or web address].

## 2023-05-09 DIAGNOSIS — L03314 Cellulitis of groin: Secondary | ICD-10-CM | POA: Diagnosis not present

## 2023-05-09 LAB — BASIC METABOLIC PANEL
Anion gap: 10 (ref 5–15)
BUN: 69 mg/dL — ABNORMAL HIGH (ref 8–23)
CO2: 25 mmol/L (ref 22–32)
Calcium: 8.4 mg/dL — ABNORMAL LOW (ref 8.9–10.3)
Chloride: 106 mmol/L (ref 98–111)
Creatinine, Ser: 3.24 mg/dL — ABNORMAL HIGH (ref 0.44–1.00)
GFR, Estimated: 14 mL/min — ABNORMAL LOW (ref >60)
Glucose, Bld: 104 mg/dL — ABNORMAL HIGH (ref 70–99)
Potassium: 4.4 mmol/L (ref 3.5–5.1)
Sodium: 141 mmol/L (ref 135–145)

## 2023-05-09 LAB — CBC
HCT: 24.1 % — ABNORMAL LOW (ref 36.0–46.0)
Hemoglobin: 7.8 g/dL — ABNORMAL LOW (ref 12.0–15.0)
MCH: 28.8 pg (ref 26.0–34.0)
MCHC: 32.4 g/dL (ref 30.0–36.0)
MCV: 88.9 fL (ref 80.0–100.0)
Platelets: 69 10*3/uL — ABNORMAL LOW (ref 150–400)
RBC: 2.71 MIL/uL — ABNORMAL LOW (ref 3.87–5.11)
RDW: 16.1 % — ABNORMAL HIGH (ref 11.5–15.5)
WBC: 7 10*3/uL (ref 4.0–10.5)
nRBC: 0 % (ref 0.0–0.2)

## 2023-05-09 NOTE — Care Management Important Message (Signed)
Important Message  Patient Details  Name: Dawn Bruce MRN: [id number] Date of Birth: 1946-04-08   Important Message Given:  Yes - Medicare IM     Bernadette Hoit 05/09/2023, 2:41 PM

## 2023-05-09 NOTE — Progress Notes (Signed)
[facility]  ROUNDING NOTE   Subjective:   Dawn Bruce  is a 77 y.o. female with medical problems of hypertension, heart failure with preserved ejection fraction, history of pleural effusions, history of diffuse vascular disease including coronary artery disease-history of PCI in LAD stent October 2023, chronic kidney disease, prediabetes, atrial fibrillation status post cardioversion in 2022 followed by Dr. Alyson Locket at [facility] nephrology. Patient presents to the ED with hypotension and suspected wound infection. She has been admitted for Cellulitis of right groin [L03.314] Cellulitis of right thigh [L03.115]  Patient is known to our practice from previous admission.   Patient seen sitting at side of bed Preparing to walk with therapy States she rested well overnight Family at bedside states that she ate a good dinner and breakfast  Creatinine 3.24   Objective:  Vital signs in last 24 hours:  Temp:  [98 F (36.7 C)-99 F (37.2 C)] 98 F (36.7 C) (12/18 0753) Pulse Rate:  [87-108] 87 (12/18 0753) Resp:  [16-18] 18 (12/18 0753) BP: (136-158)/(48-80) 136/48 (12/18 0753) SpO2:  [94 %-100 %] 100 % (12/18 0753) Weight:  [67.2 kg] 67.2 kg (12/18 0500)  Weight change: 3.9 kg Filed Weights   05/06/23 0500 05/08/23 0340 05/09/23 0500  Weight: 75.8 kg 63.3 kg 67.2 kg    Intake/Output: I/O last 3 completed shifts: In: 1206 [P.O.:1144; IV Piggyback:62] Out: 1500 [Urine:1450; Drains:50]   Intake/Output this shift:  No intake/output data recorded.  Physical Exam: General: NAD  Head: Normocephalic, atraumatic. Moist oral mucosal membranes  Eyes: Anicteric  Lungs:  Clear to auscultation.normal effort  Heart: Regular rate and rhythm  Abdomen:  Soft, nontender  Extremities:  No peripheral edema.  Neurologic: Alert and oriented, moving all four extremities  Skin: No lesions       Basic Metabolic Panel: Recent Labs  Lab 05/03/23 0659 05/04/23 0530 05/05/23 0525  05/06/23 0509 05/07/23 0540 05/08/23 0338 05/09/23 0530  NA 137 137 137 137 138 140 141  K 4.6 4.5 4.6 4.6 4.4 4.3 4.4  CL 106 106 106 105 105 108 106  CO2 21* 22 21* 22 25 24 25   GLUCOSE 105* 105* 106* 105* 107* 118* 104*  BUN 79* 88* 91* 92* 87* 83* 69*  CREATININE 3.95* 4.56* 4.67* 4.67* 4.46* 4.08* 3.24*  CALCIUM 8.2* 8.1* 8.3* 8.3* 8.5* 8.4* 8.4*  MG 2.3 2.2  --   --   --   --   --   PHOS  --  6.8*  --   --   --   --   --     Liver Function Tests: No results for input(s): "AST", "ALT", "ALKPHOS", "BILITOT", "PROT", "ALBUMIN" in the last 168 hours.  No results for input(s): "LIPASE", "AMYLASE" in the last 168 hours. No results for input(s): "AMMONIA" in the last 168 hours.  CBC: Recent Labs  Lab 05/06/23 0509 05/06/23 1338 05/07/23 0540 05/08/23 0338 05/09/23 0530  WBC 6.3 7.2 6.3 7.1 7.0  NEUTROABS  --  5.4  --   --   --   HGB 9.5* 9.8* 9.0* 8.3* 7.8*  HCT 28.9* 29.8* 27.6* 25.2* 24.1*  MCV 86.5 87.9 87.1 87.2 88.9  PLT 51* 50* 39* 43* 69*    Cardiac Enzymes: Recent Labs  Lab 05/04/23 0530 05/07/23 1822  CKTOTAL 38 39    BNP: Invalid input(s): "POCBNP"  CBG: No results for input(s): "GLUCAP" in the last 168 hours.   Microbiology: Results for orders placed or performed during the hospital  encounter of 04/22/23  Blood Culture (routine x 2)     Status: None   Collection Time: 04/22/23  3:28 PM   Specimen: BLOOD  Result Value Ref Range Status   Specimen Description BLOOD RIGHT ANTECUBITAL  Final   Special Requests   Final    BOTTLES DRAWN AEROBIC AND ANAEROBIC Blood Culture results may not be optimal due to an excessive volume of blood received in culture bottles   Culture   Final    NO GROWTH 5 DAYS Performed at [facility], [address], [place], [place] [address]    Report Status 04/27/2023 FINAL  Final  C Difficile Quick Screen w PCR reflex     Status: None   Collection Time: 04/22/23  4:19 PM   Specimen: STOOL  Result Value Ref  Range Status   C Diff antigen NEGATIVE NEGATIVE Final   C Diff toxin NEGATIVE NEGATIVE Final   C Diff interpretation No C. difficile detected.  Final    Comment: Performed at [facility], [address] [place], [place] [address]  Gastrointestinal Panel by PCR , Stool     Status: None   Collection Time: 04/22/23  4:25 PM   Specimen: Stool  Result Value Ref Range Status   Campylobacter species NOT DETECTED NOT DETECTED Final   Plesimonas shigelloides NOT DETECTED NOT DETECTED Final   Salmonella species NOT DETECTED NOT DETECTED Final   Yersinia enterocolitica NOT DETECTED NOT DETECTED Final   Vibrio species NOT DETECTED NOT DETECTED Final   Vibrio cholerae NOT DETECTED NOT DETECTED Final   Enteroaggregative E coli (EAEC) NOT DETECTED NOT DETECTED Final   Enteropathogenic E coli (EPEC) NOT DETECTED NOT DETECTED Final   Enterotoxigenic E coli (ETEC) NOT DETECTED NOT DETECTED Final   Shiga like toxin producing E coli (STEC) NOT DETECTED NOT DETECTED Final   Shigella/Enteroinvasive E coli (EIEC) NOT DETECTED NOT DETECTED Final   Cryptosporidium NOT DETECTED NOT DETECTED Final   Cyclospora cayetanensis NOT DETECTED NOT DETECTED Final   Entamoeba histolytica NOT DETECTED NOT DETECTED Final   Giardia lamblia NOT DETECTED NOT DETECTED Final   Adenovirus F40/41 NOT DETECTED NOT DETECTED Final   Astrovirus NOT DETECTED NOT DETECTED Final   Norovirus GI/GII NOT DETECTED NOT DETECTED Final   Rotavirus A NOT DETECTED NOT DETECTED Final   Sapovirus (I, II, IV, and V) NOT DETECTED NOT DETECTED Final    Comment: Performed at [facility], [address] [place], [place] [address]  Blood Culture (routine x 2)     Status: None   Collection Time: 04/23/23  6:31 AM   Specimen: BLOOD  Result Value Ref Range Status   Specimen Description BLOOD BLOOD RIGHT HAND  Final   Special Requests   Final    BOTTLES DRAWN AEROBIC AND ANAEROBIC Blood Culture results may not be optimal due to  an inadequate volume of blood received in culture bottles   Culture   Final    NO GROWTH 5 DAYS Performed at [facility], [address] [place], [place] [address]    Report Status 04/28/2023 FINAL  Final  Aerobic Culture w Gram Stain (superficial specimen)     Status: None   Collection Time: 04/23/23 10:42 AM   Specimen: Groin  Result Value Ref Range Status   Specimen Description   Final    GROIN Performed at [facility], [address] [place], [place] [address]    Special Requests   Final  NONE Performed at [facility], [address] [place], [place] [address]    Gram Stain   Final    RARE WBC PRESENT, PREDOMINANTLY PMN RARE GRAM NEGATIVE RODS RARE GRAM NEGATIVE COCCOBACILLI Performed at [facility], [address], [place], [place] [address]    Culture   Final    MODERATE MORGANELLA MORGANII FEW ESCHERICHIA COLI FEW PROTEUS MIRABILIS    Report Status 04/26/2023 FINAL  Final   Organism ID, Bacteria ESCHERICHIA COLI  Final   Organism ID, Bacteria MORGANELLA MORGANII  Final   Organism ID, Bacteria PROTEUS MIRABILIS  Final      Susceptibility   Escherichia coli - MIC*    AMPICILLIN >=32 RESISTANT Resistant     CEFEPIME <=0.12 SENSITIVE Sensitive     CEFTAZIDIME <=1 SENSITIVE Sensitive     CEFTRIAXONE <=0.25 SENSITIVE Sensitive     CIPROFLOXACIN <=0.25 SENSITIVE Sensitive     GENTAMICIN <=1 SENSITIVE Sensitive     IMIPENEM <=0.25 SENSITIVE Sensitive     TRIMETH/SULFA <=20 SENSITIVE Sensitive     AMPICILLIN/SULBACTAM 16 INTERMEDIATE Intermediate     PIP/TAZO <=4 SENSITIVE Sensitive ug/mL    * FEW ESCHERICHIA COLI   Morganella morganii - MIC*    AMPICILLIN >=32 RESISTANT Resistant     CEFTAZIDIME <=1 SENSITIVE Sensitive     CIPROFLOXACIN <=0.25 SENSITIVE Sensitive     GENTAMICIN <=1 SENSITIVE Sensitive     IMIPENEM 4 SENSITIVE Sensitive     TRIMETH/SULFA <=20 SENSITIVE Sensitive     AMPICILLIN/SULBACTAM 16 INTERMEDIATE  Intermediate     PIP/TAZO <=4 SENSITIVE Sensitive ug/mL    * MODERATE MORGANELLA MORGANII   Proteus mirabilis - MIC*    AMPICILLIN <=2 SENSITIVE Sensitive     CEFEPIME <=0.12 SENSITIVE Sensitive     CEFTAZIDIME <=1 SENSITIVE Sensitive     CEFTRIAXONE <=0.25 SENSITIVE Sensitive     CIPROFLOXACIN <=0.25 SENSITIVE Sensitive     GENTAMICIN <=1 SENSITIVE Sensitive     IMIPENEM 2 SENSITIVE Sensitive     TRIMETH/SULFA <=20 SENSITIVE Sensitive     AMPICILLIN/SULBACTAM <=2 SENSITIVE Sensitive     PIP/TAZO <=4 SENSITIVE Sensitive ug/mL    * FEW PROTEUS MIRABILIS  Aerobic/Anaerobic Culture w Gram Stain (surgical/deep wound)     Status: None   Collection Time: 04/25/23  2:41 PM   Specimen: Wound; Body Fluid  Result Value Ref Range Status   Specimen Description   Final    WOUND Performed at [facility] [place], [address] [place], [place] [address]    Special Requests RETROPERITONEAL HEMATOMA SPEC A  Final   Gram Stain   Final    FEW WBC PRESENT,BOTH PMN AND MONONUCLEAR NO ORGANISMS SEEN    Culture   Final    RARE ENTEROCOCCUS FAECALIS NO ANAEROBES ISOLATED Performed at [facility], [address], [place], [place] [address]    Report Status 05/02/2023 FINAL  Final   Organism ID, Bacteria ENTEROCOCCUS FAECALIS  Final      Susceptibility   Enterococcus faecalis - MIC*    AMPICILLIN <=2 SENSITIVE Sensitive     VANCOMYCIN 2 SENSITIVE Sensitive     GENTAMICIN SYNERGY SENSITIVE Sensitive     * RARE ENTEROCOCCUS FAECALIS    Coagulation Studies: No results for input(s): "LABPROT", "INR" in the last 72 hours.  Urinalysis: No results for input(s): "COLORURINE", "LABSPEC", "PHURINE", "GLUCOSEU", "HGBUR", "BILIRUBINUR", "KETONESUR", "PROTEINUR", "UROBILINOGEN", "NITRITE", "LEUKOCYTESUR" in the last 72 hours.  Invalid input(s): "APPERANCEUR"    Imaging: DG Chest  Port 1 View Result Date: 05/08/2023 CLINICAL DATA:  Status post right thoracentesis. EXAM: PORTABLE CHEST 1  VIEW COMPARISON:  05/07/2023 FINDINGS: Improved aeration at the right lung base compatible with recent thoracentesis. Negative for a pneumothorax. Markedly increased densities at the left lung base compared to 05/07/2023 raises concern for re-accumulation of left pleural fluid although cannot exclude airspace disease or volume loss in this area. Heart size is within normal limits and stable. Right arm PICC line tip is in the SVC region. IMPRESSION: 1. Improved aeration at the right lung base compatible with recent thoracentesis. Negative for a pneumothorax. 2. Increased densities at the left lung base. Findings likely represent re-accumulation of pleural fluid and atelectasis at the left lung base. Electronically Signed   By: Richarda Overlie M.D.   On: 05/08/2023 13:21   US THORACENTESIS ASP PLEURAL SPACE W/IMG GUIDE Result Date: 05/08/2023 INDICATION: Patient with congestive heart failure, with a right-sided pleural effusion. Request for therapeutic thoracentesis. EXAM: ULTRASOUND GUIDED THERAPEUTIC THORACENTESIS MEDICATIONS: 8 ml of 1% lidocaine COMPLICATIONS: None immediate. PROCEDURE: An ultrasound guided thoracentesis was thoroughly discussed with the patient and questions answered. The benefits, risks, alternatives and complications were also discussed. The patient understands and wishes to proceed with the procedure. Written consent was obtained. Ultrasound was performed to localize and mark an adequate pocket of fluid in the right chest. The area was then prepped and draped in the normal sterile fashion. 1% Lidocaine was used for local anesthesia. Under ultrasound guidance a 6 Fr Safe-T-Centesis catheter was introduced. Thoracentesis was performed. The catheter was removed and a dressing applied. FINDINGS: A total of approximately 550 cc of dark amber pleural fluid was removed. IMPRESSION: Successful ultrasound guided right thoracentesis yielding 550 cc of pleural fluid. Performed by Buzzy Han, PA-C  Electronically Signed   By: Richarda Overlie M.D.   On: 05/08/2023 13:15   DG Chest Port 1 View Result Date: 05/07/2023 CLINICAL DATA:  Post thoracentesis EXAM: PORTABLE CHEST 1 VIEW COMPARISON:  05/06/2023 FINDINGS: Decreased left pleural effusion post thoracentesis. No definitive pneumothorax. Small residual left effusion. Increased now moderate appearing right pleural effusion. Stable cardiomediastinal silhouette with aortic atherosclerosis and vascular congestion. Right upper extremity central venous catheter tip at the SVC. IMPRESSION: 1. Decreased left pleural effusion post thoracentesis. No definitive pneumothorax. 2. Increased now moderate appearing right pleural effusion. Small residual left effusion Electronically Signed   By: Jasmine Pang M.D.   On: 05/07/2023 18:01   US THORACENTESIS ASP PLEURAL SPACE W/IMG GUIDE Result Date: 05/07/2023 INDICATION: Patient with congestive heart failure, with left pleural effusion. Request for therapeutic thoracentesis. EXAM: ULTRASOUND GUIDED THERAPEUTIC THORACENTESIS MEDICATIONS: 9 cc of 1% lidocaine COMPLICATIONS: None immediate. PROCEDURE: An ultrasound guided thoracentesis was thoroughly discussed with the patient and questions answered. The benefits, risks, alternatives and complications were also discussed. The patient understands and wishes to proceed with the procedure. Written consent was obtained. Ultrasound was performed to localize and mark an adequate pocket of fluid in the left chest. The area was then prepped and draped in the normal sterile fashion. 1% Lidocaine was used for local anesthesia. Under ultrasound guidance a 6 Fr Safe-T-Centesis catheter was introduced. Thoracentesis was performed. The catheter was removed and a dressing applied. FINDINGS: A total of approximately 1.5 L of straw-colored pleural fluid was removed. IMPRESSION: Successful ultrasound guided left thoracentesis yielding 1.5 L of pleural fluid. Performed by Buzzy Han, PA-C  Electronically Signed   By: Judie Petit.  Shick M.D.   On: 05/07/2023 16:28  Medications:    DAPTOmycin Stopped (05/07/23 1845)    alum & mag hydroxide-simeth  15 mL Oral Once   apixaban  2.5 mg Oral BID   aspirin EC  81 mg Oral Daily   atorvastatin  40 mg Oral Daily   carvedilol  12.5 mg Oral BID WC   Chlorhexidine Gluconate Cloth  6 each Topical Daily   ciprofloxacin  500 mg Oral Q breakfast   clopidogrel  75 mg Oral Daily   ezetimibe  10 mg Oral Daily   feeding supplement  237 mL Oral BID BM   fentaNYL  1 patch Transdermal Q72H   pregabalin  75 mg Oral Daily   sodium chloride flush  10-40 mL Intracatheter Q12H   traZODone  50 mg Oral QHS   acetaminophen, albuterol, benzonatate, oxyCODONE, phenylephrine, sodium chloride flush, witch hazel-glycerin  Assessment/ Plan:  Ms. Dawn Bruce is a 77 y.o.  female with medical problems of hypertension, heart failure with preserved ejection fraction, history of pleural effusions, history of diffuse vascular disease including coronary artery disease-history of PCI in LAD stent October 2023, chronic kidney disease, prediabetes, atrial fibrillation status post cardioversion in 2022 followed by Dr. Alyson Locket at [facility] nephrology. Patient presents to the ED with hypotension and suspected wound infection. She has been admitted for Cellulitis of right groin [L03.314] Cellulitis of right thigh [L03.115]   Acute Kidney Injury on chronic kidney disease stage IIIb with baseline creatinine 1.3 and GFR of 42 on 03/22/23.  Acute kidney injury appears multifactorial, hypotension, IV contrast exposure, and diuretics. Creatinine on admission, 1.17.  Patient experienced hypertension with subsequent hypotension.  IV contrast exposure on 04/22/2023.  Received approximately 2 days of diuresis.    Creatinine continues to improve along with oral intake.  Will continue to monitor.  No acute indication for dialysis.  Lab Results  Component Value Date   CREATININE 3.24 (H)  05/09/2023   CREATININE 4.08 (H) 05/08/2023   CREATININE 4.46 (H) 05/07/2023    Intake/Output Summary (Last 24 hours) at 05/09/2023 1203 Last data filed at 05/08/2023 1844 Gross per 24 hour  Intake 419 ml  Output 50 ml  Net 369 ml   2. Anemia of chronic kidney disease Lab Results  Component Value Date   HGB 7.8 (L) 05/09/2023    Hemoglobin slowly decreasing.  Patient denies any signs of bleeding.  Will monitor  3.  Hypertension, home regimen includes carvedilol, isosorbide, and valsartan.  Currently receiving carvedilol only. Blood pressure remains stable for this patient   LOS: 17 Dawn Bruce 12/18/202412:03 PM

## 2023-05-09 NOTE — Progress Notes (Signed)
Progress Note   Patient: Dawn Bruce ZOX:[id number] DOB: 1946-03-13 DOA: 04/22/2023     17 DOS: the patient was seen and examined on 05/09/2023   Brief hospital course: Shateria Niebauer is a 77 y.o. female with medical history significant of dCHF, CAD (s/p of recent DES), A fib on Eliquis, HTN, HLD, CDK-3a, chronic pain syndrome, multiple skin cancer, perforated appendicitis, former smoker, who presents with right groin pain.  Patient was recently hospitalized from 11/21 - 11/29 due to non-STEMI. Patient is s/p LHC with DES placement. Pt developed retroperitoneal hematoma & hemorrhagic shock due to complication of Iliac artery laceration during left heart cath of right groin. Pt underwent surgical repair of external iliac artery by [facility] on 11/23.  Presented back to ED 12/1 due to increased redness, swelling, and pain at incision site of the cath. Failed outpatient Abx x1 dose.  Hypotensive to 80/50 on presentation. 12/2: pulmonology, and IR consulted to address pleural effusion. ID consulted to address abx course. Transitioned to linezolid. Vascular evaluated and recommended debridement. 12/3: Thoracentesis removed 1.6L left side 12/4:stable ORA, afebrile. Wound clinically greatly improved. Debridement today. Plan to stop heparin gtt and return to eliquis + DAPT s/p procedure 12/6: additional debridement.  Debridement again 12/10. Now on zosyn. Doing well. Respiratory status stable ORA.  12/12.  Patient had a worsening renal function, nephrology consult obtained. 12/14.  Worsening thrombocytopenia, consult hematology oncology to rule out TTS. 12/15.  Thoracentesis removed 1.5 Lon left 12/18 . Improved throbocytopenia       Assessment and Plan:  Acute renal failure on chronic kidney disease stage IIIa. Hypokalemia. Mild metabolic acidosis. Transient hypotension. Renal function shows some improvement. Serum creatinine peaked at 4.67 which shows a downward trend Blood pressure is  stable Appreciate nephrology input.      Sepsis due to cellulitis of right groin Right groin wound infection secondary to surgical repair of pseudoaneurysm. Sepsis criteria met on admission with WBC 21.3, heart rate 104, RR 29.  Lactic acid normal 1.7.   Sepsis appears to be secondary to right groin surgical wound infection.  Patient had debridement x 2, wound-vac in place Wound culture grow E. coli, Morganella morganii, Proteus Morales, Enterococcus faecalis.  Blood culture negative.  Initially treated with linezolid + cefepime, switched to Zosyn on 12/11.  Dressing change performed by vascular surgery 12/12, wound is getting better with significant granulation. Discussed with vascular surgery and ID, will continue IV antibiotics while in the hospital.  Due to severe thrombocytopenia, most likely caused by Zosyn.   Antibiotic switched to Cipro and daptomycin per ID.(Continue antibiotic therapy through 05/20/2023) Patient had a dressing changes performed 12/16, wound healing well.    Hypervolemia  Bilateral Pleural effusions Acute on chronic systolic congestive heart failure.  Hx of CAD: s/p recent DES. Last known LVEF of 25 to 30% from a 2D echocardiogram which was done 11/24 Patient initially received IV Lasix, also had a thoracentesis. Volume status has improved. Discussed with cardiology, will continue Plavix, not able to discontinue due to recent drug-eluting stent. Repeated chest x-ray on 12/15 showed moderate pleural effusion.  Pulmonology is following. Thoracentesis removed 1.5 L of fluids on 12/16 on left, paracentesis repeated for right today.    Peritoneal hematoma Anemia acute on chronic. Thrombocytopenia. Hemoglobin dropped down to 7.6 12/11, received another unit of PRBC transfusion.  Normal iron, B12 level borderline, check homocystine level normal.  Hemoglobin much better. Patient developed mild thrombocytopenia of 117 on 12/12, not currently on heparin.   Noted to  have worsening thrombocytopenia with the lowest platelet count of 39. Patient was seen by hematology interval cytopenia attributed to Zosyn.  No evidence of TTS.  No active bleeding.  Zosyn discontinued. Improved platelet count    Choking episode. Patient had a choking episode the night of 12/16, appears to have improved  Patient does not have a baseline dysphagia.  Continue to follow.    Essential hypertension: Continue carvedilol  Hold ARB due to acute renal failure.    Atrial fibrillation, chronic (HCC): rate controlled On eliquis Increased Coreg to 12.5 mg which is home dose.   Diarrhea: C. difficile negative. Diarrhea improved after giving Imodium.   Chronic pain syndrome -Continue home fentanyl patch and Lyrica   HLD: -lipitor and zetia            Subjective: Patient is seen and examined at the bedside.  Husband is at the bedside.  Physical Exam: Vitals:   05/09/23 0453 05/09/23 0500 05/09/23 0737 05/09/23 0753  BP: (!) 141/68   (!) 136/48  Pulse: 100   87  Resp: 17   18  Temp: 98.9 F (37.2 C)   98 F (36.7 C)  TempSrc: Oral   Oral  SpO2: 95%  96% 100%  Weight:  67.2 kg    Height:       General exam: Appears calm and comfortable  Respiratory system: Decreased breath sounds on right, no crackles. Respiratory effort normal. Cardiovascular system: Irregularly irregular. No JVD, murmurs, rubs, gallops or clicks. No pedal edema. Gastrointestinal system: Abdomen is nondistended, soft and nontender. No organomegaly or masses felt. Normal bowel sounds heard. Central nervous system: Alert and oriented. No focal neurological deficits. Extremities: Symmetric 5 x 5 power. Skin: No rashes, lesions or ulcers, wound VAC in place in right groin Psychiatry: Judgement and insight appear normal. Mood & affect appropriate.        Data Reviewed: Labs reviewed.  Creatinine 3.24, hemoglobin 7.8, platelet count 69 There are no new results to review at this  time.  Family Communication: Plan of care discussed with patient and her husband at the bedside.  Disposition: Status is: Inpatient Remains inpatient appropriate because: On IV antibiotics  Planned Discharge Destination:  TBD    Time spent: 35  minutes  Author: Lucile Shutters, MD 05/09/2023 1:04 PM  For on call review [email or web address].

## 2023-05-09 NOTE — Progress Notes (Signed)
PULMONOLOGY         Date: 05/09/2023,   MRN# [id number] Dawn Bruce 12-18-1945     AdmissionWeight: 54.4 kg                 CurrentWeight: 67.2 kg  Referring provider: Dr Gilda Crease   CHIEF COMPLAINT:   Complete atelectasis of left lung with bilateral pleural effusions.    HISTORY OF PRESENT ILLNESS   77 year old female with a history of chronic GERD, basal cell carcinoma squamous cell carcinoma history of NSTEMI diastolic CHF, CAD status post recent PCI with DES, chronic A-fib on Eliquis, essential hypertension, dyslipidemia CKD 3A chronic pain syndrome remote perforated appendicitis, hospitalized November 2024 with non-STEMI.  Post left heart cath had developed retroperitoneal hematoma and hemorrhagic shock with complications of iliac artery laceration.  Status post surgical repair via vascular surgery service.  Had outpatient therapy with antibiotics with failure came in with transient hypotension and circulatory shock.  Found to have elevated cardiac biomarkers and acute on chronic CHF.  Vascular surgery was reconsulted today and PCCM consultation was placed to incidental finding of complete atelectasis of the left lung with surrounding bilateral pleural effusions worse on the left.  04/25/23- patient evaluated in PACU post op , appears to be mildy sedated still. Mildy hypotensive at this moment.  Lung sounds much improved post 1.6L left pleural space aspiration.  04/26/23- patient seen and examined , Dawn Bruce (daughter) at bedside.  Patient receiving blood transfusion.  Lung sounds are clear , vitals are stable.  Hypoalbulminemia continues. Will order urine protein and pre albumin levels,  because patient shares she's eating well.  CXR in am to check interval effusion.  04/27/23- patient seen at bedside, husband and daughter present.  For surgery wound washout and vac exchange today.   Vitals stable , room air spO2 99%     04/30/23- patient seen at bedside.  Has  additional surgery planned with debridement.  CXR with recurring effusion and associated compressive atelelctasis.   Ideally would like to increase diuresis and recruitment maunevers.  E. Faecallis noted on wound culture.  I discussed case with Dr Avon Gully infectious disease specialist today.  Dr Dareen Piano is also considering pleurX catheter which patient may need if this does not resolve with medical management. She already had multiple thoracentesis. No noew labs are available for review but are in process. Will order purewick for urination due to physical weakness.      05/01/23-  patient reports improvement post diuresis.  Will obtain interval CXR today.  We discussed PleurX permanent tunneled catheter and she declined to have this done at the moment.  She is on room air with normoxia.  She developed mild contraction and we will take diuretic holiday.   05/02/23- patient with severe hypotension.  I have dcd her beta blocker today and imdur. She is now getting IV fluids and is able to speak.  Will start midodrine 5mg  po tid.  05/03/23- patient is now no longer hypotensive.  She had AKI and this is very likely related to circulatory shock over past 24h.  She had fluid via IV and I'm concerned regarding reacculumation of pleural fluid so I have dcd this now since she is hypertensive.  There is nephrology consult in progress.  05/04/23- patient is improved clinically, she has improved vital signs,  she does have AKI but she is making light yellow urine.  She has nephrology evaluation.  There is consideration for HD.  05/06/23- patient clinically  reporting mildy worsening SOB.  Repeat CXR reviewed by me with improved right hemithorax but worse on left with reaccumulated effusion moderate.  She has progressive thrombocytopenia with today platelets <51, does not appear toxic at all and is lucid alert and appropriate.  She has PCI x2 in the past.  Patient is uremic and am concerned regarding potential for  bleeding but same time concern for stent thrombosis/clotting with AF/CAD.  Have discussed with team and will ask for additional evaluation with cardiology.  Dr Chipper Herb has also notified oncology to help with blood abnormalities.   05/07/23- patient continues to produce adequate urine light yellow in color renal function is improved, respiratory status however seems  more labored today.  Her pleural effusion has re-accumulated and we will order US thoracentesis today. She had oncology and cardiology evaluation.  It seems thrombocytopenia may be due to Zosyn.   She had vascular surgery evaluation today.   05/08/23- patient seen at bedside.  She reports sudden onset of cough after eating yesterday, suspect due to aspiration.  She had some nuts while laying in bed. We discussed this and asked to perform aspiration precautions with meals. She had Left thoracentesis with improved CXR post procedure.  Today we plan to drain right pleural space. She has improved renal function and is urinating adequately.  She has multiple specialists following her care, appreciate everyone involved.  Her vitals appear stable.  Platelet counts have improved.  She is ambulatory with PT/OT.  Overall she is clinically improved.  05/09/23- patient is further improved.  She is ambulatory without dyspnea.  AKI is improved. CXR reviwed and improved. Plan for OR in am.    PAST MEDICAL HISTORY   Past Medical History:  Diagnosis Date   Actinic keratosis    Chronic pain    GERD (gastroesophageal reflux disease) ?   Taking Pepcid   History of basal cell carcinoma (BCC) 05/02/2021   right upper back paraspinal   History of SCC (squamous cell carcinoma) of skin 09/10/2019   left dorsum wrist ED&C done 10/28/19   History of SCC (squamous cell carcinoma) of skin 03/04/2020   right dorsum hand   History of squamous cell carcinoma in situ (SCCIS) 03/04/2020   left dorsum wrist  ED&C   History of squamous cell carcinoma in situ (SCCIS)  03/04/2020   right medial infraorbital  ED&C 04/28/2020   History of squamous cell carcinoma in situ (SCCIS) 10/28/2019   right dorsum hand proximal lateral   History of squamous cell carcinoma in situ (SCCIS) 10/28/2019   right dorsum hand proximal medial   Hyperlipidemia    Hypertension    NSTEMI (non-ST elevated myocardial infarction) (HCC) 04/12/2023   Renal disorder    Squamous cell carcinoma in situ 10/31/2021   left distal  tricep, EDC   Squamous cell carcinoma of skin 08/28/2022   Right volar forearm - [facility]     SURGICAL HISTORY   Past Surgical History:  Procedure Laterality Date   ABDOMINAL HYSTERECTOMY     APPENDECTOMY  April 2023   Burst appendix   APPLICATION OF WOUND VAC Right 04/25/2023   Procedure: APPLICATION OF WOUND VAC;  Surgeon: Renford Dills, MD;  Location: [facility] ORS;  Service: Vascular;  Laterality: Right;   APPLICATION OF WOUND VAC Right 04/27/2023   Procedure: WOUND VAC EXCHANGE;  Surgeon: Renford Dills, MD;  Location: [facility] ORS;  Service: Vascular;  Laterality: Right;   APPLICATION OF WOUND VAC Right 05/01/2023   Procedure: APPLICATION OF  WOUND VAC;  Surgeon: Gilda Crease Latina Craver, MD;  Location: [facility] ORS;  Service: Vascular;  Laterality: Right;   AUGMENTATION MAMMAPLASTY Bilateral    25-30 years ago   CARDIOVERSION N/A 12/01/2020   Procedure: CARDIOVERSION;  Surgeon: Debbe Odea, MD;  Location: [facility] ORS;  Service: Cardiovascular;  Laterality: N/A;   CAROTID ARTERY ANGIOPLASTY Right 1995(approximate)   CAROTID ENDARTERECTOMY  2001   CORONARY STENT INTERVENTION N/A 04/13/2023   Procedure: CORONARY STENT INTERVENTION;  Surgeon: Marcina Millard, MD;  Location: [facility] IN[facility];  Service: Cardiovascular;  Laterality: N/A;   COSMETIC SURGERY     ELBOW SURGERY Left    ENDARTERECTOMY FEMORAL Right 04/13/2023   Procedure: Right groin exploration with repair of right external iliac artery and right circumflex artery;  Surgeon: Bertram Denver,  MD;  Location: [facility] OR[facility];  Service: Vascular;  Laterality: Right;   FRACTURE SURGERY  2016   Arm   INCISION AND DRAINAGE OF WOUND Right 04/25/2023   Procedure: IRRIGATION AND DEBRIDEMENT WOUND;  Surgeon: Renford Dills, MD;  Location: [facility] OR[facility];  Service: Vascular;  Laterality: Right;   INCISION AND DRAINAGE OF WOUND Right 04/27/2023   Procedure: IRRIGATION AND DEBRIDEMENT WOUND;  Surgeon: Renford Dills, MD;  Location: [facility] OR[facility];  Service: Vascular;  Laterality: Right;   INCISION AND DRAINAGE OF WOUND Right 05/01/2023   Procedure: IRRIGATION AND DEBRIDEMENT WOUND;  Surgeon: Renford Dills, MD;  Location: [facility];  Service: Vascular;  Laterality: Right;   LEFT HEART CATH AND CORONARY ANGIOGRAPHY N/A 04/13/2023   Procedure: LEFT HEART CATH AND CORONARY ANGIOGRAPHY;  Surgeon: Marcina Millard, MD;  Location: [facility] IN[facility];  Service: Cardiovascular;  Laterality: N/A;   THORACENTESIS N/A 12/27/2020   Procedure: Alanson Puls;  Surgeon: Josephine Igo, DO;  Location: [facility];  Service: Pulmonary;  Laterality: N/A;   TONSILLECTOMY     TUBAL LIGATION  ?     FAMILY HISTORY   Family History  Problem Relation Age of Onset   Cancer Mother    Cancer Father    Cancer Sister    Breast cancer Neg Hx      SOCIAL HISTORY   Social History   Tobacco Use   Smoking status: Former    Current packs/day: 0.00    Average packs/day: 0.3 packs/day for 15.0 years (3.8 ttl pk-yrs)    Types: Cigarettes    Start date: 70    Quit date: 36    Years since quitting: 34.9    Passive exposure: Past   Smokeless tobacco: Never   Tobacco comments:    quit around 1990-1995 (?)  Vaping Use   Vaping status: Never Used  Substance Use Topics   Alcohol use: Yes    Alcohol/week: 14.0 standard drinks of alcohol    Types: 14 Standard drinks or equivalent per week    Comment: 2 scotch shots   Drug use: Never     MEDICATIONS    Home Medication:     Current  Medication:  Current Facility-Administered Medications:    acetaminophen (TYLENOL) tablet 650 mg, 650 mg, Oral, Q6H PRN, Schnier, Latina Craver, MD, 650 mg at 05/05/23 1809   albuterol (PROVENTIL) (2.5 MG/3ML) 0.083% nebulizer solution 2.5 mg, 2.5 mg, Nebulization, Q4H PRN, Schnier, Latina Craver, MD, 2.5 mg at 05/08/23 2100   alum & mag hydroxide-simeth (MAALOX/MYLANTA) 200-200-20 MG/5ML suspension 15 mL, 15 mL, Oral, Once, Schnier, Latina Craver, MD   apixaban Everlene Balls) tablet 2.5 mg, 2.5 mg, Oral, BID, Schnier, Latina Craver, MD,  2.5 mg at 05/08/23 2200   aspirin EC tablet 81 mg, 81 mg, Oral, Daily, Schnier, Latina Craver, MD, 81 mg at 05/08/23 0849   atorvastatin (LIPITOR) tablet 40 mg, 40 mg, Oral, Daily, Karna Christmas, Abeni Finchum, MD, 40 mg at 05/08/23 0849   benzonatate (TESSALON) capsule 200 mg, 200 mg, Oral, TID PRN, Vida Rigger, MD, 200 mg at 05/08/23 1334   carvedilol (COREG) tablet 12.5 mg, 12.5 mg, Oral, BID WC, Marrion Coy, MD, 12.5 mg at 05/08/23 1814   Chlorhexidine Gluconate Cloth 2 % PADS 6 each, 6 each, Topical, Daily, Schnier, Latina Craver, MD, 6 each at 05/08/23 0900   ciprofloxacin (CIPRO) tablet 500 mg, 500 mg, Oral, Q breakfast, Marrion Coy, MD, 500 mg at 05/08/23 4132   clopidogrel (PLAVIX) tablet 75 mg, 75 mg, Oral, Daily, Schnier, Latina Craver, MD, 75 mg at 05/08/23 0849   DAPTOmycin (CUBICIN) 600 mg in sodium chloride 0.9 % IVPB, 8 mg/kg, Intravenous, Q48H, Aleda Grana, RPH, Stopped at 05/07/23 1845   ezetimibe (ZETIA) tablet 10 mg, 10 mg, Oral, Daily, Schnier, Latina Craver, MD, 10 mg at 05/08/23 0849   feeding supplement (ENSURE ENLIVE / ENSURE PLUS) liquid 237 mL, 237 mL, Oral, BID BM, Marrion Coy, MD   fentaNYL (DURAGESIC) 75 MCG/HR 1 patch, 1 patch, Transdermal, Q72H, Schnier, Latina Craver, MD, 1 patch at 05/08/23 1045   oxyCODONE (Oxy IR/ROXICODONE) immediate release tablet 5 mg, 5 mg, Oral, Q6H PRN, Schnier, Latina Craver, MD, 5 mg at 05/04/23 0008   phenylephrine ((USE for PREPARATION-H)) 0.25 %  suppository 1 suppository, 1 suppository, Rectal, PRN, Schnier, Latina Craver, MD   pregabalin (LYRICA) capsule 75 mg, 75 mg, Oral, Daily, Marrion Coy, MD, 75 mg at 05/08/23 0849   sodium chloride flush (NS) 0.9 % injection 10-40 mL, 10-40 mL, Intracatheter, Q12H, Schnier, Latina Craver, MD, 10 mL at 05/08/23 2209   sodium chloride flush (NS) 0.9 % injection 10-40 mL, 10-40 mL, Intracatheter, PRN, Schnier, Latina Craver, MD   traZODone (DESYREL) tablet 50 mg, 50 mg, Oral, QHS, Schnier, Latina Craver, MD, 50 mg at 05/08/23 2200   witch hazel-glycerin (TUCKS) pad, , Topical, PRN, Marrion Coy, MD    ALLERGIES   Patient has no known allergies.     REVIEW OF SYSTEMS    Review of Systems:  Gen:  Denies  fever, sweats, chills weigh loss  HEENT: Denies blurred vision, double vision, ear pain, eye pain, hearing loss, nose bleeds, sore throat Cardiac:  No dizziness, chest pain or heaviness, chest tightness,edema Resp:   reports dyspnea chronically , REPORTS ASPIRATION OVERNIGHT WHILE EATING NUTS IN SUPINE POSITION Gi: Denies swallowing difficulty, stomach pain, nausea or vomiting, diarrhea, constipation, bowel incontinence Gu:  Denies bladder incontinence, burning urine Ext:   Denies Joint pain, stiffness or swelling Skin: Denies  skin rash, easy bruising or bleeding or hives Endoc:  Denies polyuria, polydipsia , polyphagia or weight change Psych:   Denies depression, insomnia or hallucinations   Other:  All other systems negative   VS: BP (!) 141/68 (BP Location: Right Arm)   Pulse 100   Temp 98.9 F (37.2 C) (Oral)   Resp 17   Ht 5' 2.99" (1.6 m)   Wt 67.2 kg   SpO2 95%   BMI 26.25 kg/m      PHYSICAL EXAM    GENERAL:NAD, no fevers, chills, no weakness no fatigue HEAD: Normocephalic, atraumatic.  EYES: Pupils equal, round, reactive to light. Extraocular muscles intact. No scleral icterus.  MOUTH: Moist mucosal membrane.  Dentition intact. No abscess noted.  EAR, NOSE, THROAT: Clear  without exudates. No external lesions.  NECK: Supple. No thyromegaly. No nodules. No JVD.  PULMONARY: decreased breath sounds with mild rhonchi worse at bases bilaterally.  CARDIOVASCULAR: S1 and S2. Regular rate and rhythm. No murmurs, rubs, or gallops. No edema. Pedal pulses 2+ bilaterally.  GASTROINTESTINAL: Soft, nontender, nondistended. No masses. Positive bowel sounds. No hepatosplenomegaly.  MUSCULOSKELETAL: No swelling, clubbing, or edema. Range of motion full in all extremities.  NEUROLOGIC: Cranial nerves II through XII are intact. No gross focal neurological deficits. Sensation intact. Reflexes intact.  SKIN: No ulceration, lesions, rashes, or cyanosis. Skin warm and dry. Turgor intact.  PSYCHIATRIC: Mood, affect within normal limits. The patient is awake, alert and oriented x 3. Insight, judgment intact.       IMAGING     ASSESSMENT/PLAN    Complete atelectasis of left lung- IMPROVED    - patient s/p diuresis with partial improvement however still has minimal lung sounds on left.   - she does have CKD 3 and may not produce adequate fluid removal via medication alone   - we discussed options for thoracentesis and patient would like to proceed with this procedure.  -incentive spirometry at bedside to be used multiple times daily  - PT/OT - etiology is likely poor nutrition post ICU hospitalization with CHF and CKD all contributing to third spaced interstitial edema , peripheral edema and effusion -repeat thoracentesis 12/15,  have ordered again thoracentesis on right    Bilateral pleural effusions    - s/p throacentesis- multiple times   - consultation for IR with US guided throacentesis - therapeutic    -patient on eliquis -prealbumin -urine protein -holding diuresis today due to [facility] and will continue to monitor renal function.  I anticipate this should improve with normal BP now. -appreciate nephrology consultation  -05/06/23- CXR with reaccumuation of left effusion -  treated with thoracentesis 12/17- ordered right thoracentesis  Severe AKI stage 5    Nephrology on case -appreciate input     - non oliguric at this moment , no HD today per specialist    AF with CAD s/p PCI x 2   - patient on plavix and eliquis     - s/p complicated surgery with multiple transfusions.    -Thrombocytopenia improved    Severe thrombocytopenia   - oncology on case - appreciate input - Dr B   - workup in progress - platelet count trending up   Sepsis due to right groin cellulitis   S/p vascular surgery eval S/p vanco /rocephin  - patient appears to be clinically improved.  - s/p ID revaluation - appreciate input  -E faecalis noted on wound care   Moderate protein calorie malnutrition   - nutritional consultation    - bitemporal wasting and peripheral muscle weakness noted   - contributing to edema/effusion   - complicating tissue healing  -high protein diet --patient aspirated on evening of 05/07/23  Thank you for allowing me to participate in the care of this patient.  Patient/Family are satisfied with care plan and all questions have been answered.    Provider disclosure: Patient with at least one acute or chronic illness or injury that poses a threat to life or bodily function and is being managed actively during this encounter.  All of the below services have been performed independently by signing provider:  review of prior documentation from internal and or external health records.  Review of previous and  current lab results.  Interview and comprehensive assessment during patient visit today. Review of current and previous chest radiographs/CT scans. Discussion of management and test interpretation with health care team and patient/family.   This document was prepared using Dragon voice recognition software and may include unintentional dictation errors.     Vida Rigger, M.D.  Division of Pulmonary & Critical Care Medicine

## 2023-05-09 NOTE — Plan of Care (Signed)

## 2023-05-09 NOTE — Progress Notes (Signed)
Progress Note    05/09/2023 11:18 AM 8 Days Post-Op  Subjective:   Dawn Bruce is a 77 yo female who presents to [facility] emergency room with post operative right groin infection.  Patient is now postop day 12 from right groin incision and drainage with wound washout and wound VAC placement for the fourth time. Wound vac change at the bedside today with IV Dilaudid 1 mg. Patient tolerated well. Patient is resting comfortably in bed this afternoon.  Wound VAC is in place and working well.  Noted serosanguineous drainage to canister and the about 55mL before changing it.  Patient does endorse some pain and soreness to her right groin but states it is manageable.  Patient remains on her 2.5 mg of Eliquis twice daily for her atrial fibrillation and continues on ASA 81 mg daily and Plavix 75 mg daily post surgical repair of her right femoral artery.    Vitals:   05/09/23 0737 05/09/23 0753  BP:  (!) 136/48  Pulse:  87  Resp:  18  Temp:  98 F (36.7 C)  SpO2: 96% 100%   Physical Exam: Cardiac:  Hx of Atrial Fibrillation. Regular Rate  Lungs: Bilateral lungs with diminished breath sounds bases. Mild Dyspnea with cough. No wheezing noted. Non Labored breathing this morning. No oxygen required.  Incisions:  Right groin with wound VAC in place and working well.  Maintain suction and noted 60 mL serosanguineous drainage now. Extremities:  Bilateral lower extremities warm to touch. Palpable pulses. Noted +1 edema to right lower extremity.  Abdomen:  Positive bowel sounds throughout, soft and non tender and non distended.  Neurologic: AAOX4, Follows commands.  CBC    Component Value Date/Time   WBC 7.0 05/09/2023 0530   RBC 2.71 (L) 05/09/2023 0530   HGB 7.8 (L) 05/09/2023 0530   HGB 13.6 06/01/2022 0000   HCT 24.1 (L) 05/09/2023 0530   HCT 40.5 06/01/2022 0000   PLT 69 (L) 05/09/2023 0530   PLT 132 (L) 06/01/2022 0000   MCV 88.9 05/09/2023 0530   MCV 90 06/01/2022 0000   MCH 28.8  05/09/2023 0530   MCHC 32.4 05/09/2023 0530   RDW 16.1 (H) 05/09/2023 0530   RDW 12.8 06/01/2022 0000   LYMPHSABS 0.5 (L) 05/06/2023 1338   LYMPHSABS 0.7 08/30/2021 0917   MONOABS 0.9 05/06/2023 1338   EOSABS 0.3 05/06/2023 1338   EOSABS 0.0 08/30/2021 0917   BASOSABS 0.0 05/06/2023 1338   BASOSABS 0.0 08/30/2021 0917    BMET    Component Value Date/Time   NA 141 05/09/2023 0530   NA 147 (H) 06/01/2022 0000   K 4.4 05/09/2023 0530   CL 106 05/09/2023 0530   CO2 25 05/09/2023 0530   GLUCOSE 104 (H) 05/09/2023 0530   BUN 69 (H) 05/09/2023 0530   BUN 23 06/01/2022 0000   CREATININE 3.24 (H) 05/09/2023 0530   CALCIUM 8.4 (L) 05/09/2023 0530   GFRNONAA 14 (L) 05/09/2023 0530   GFRAA 54 (L) 10/08/2019 1419    INR    Component Value Date/Time   INR 1.5 (H) 04/22/2023 1540     Intake/Output Summary (Last 24 hours) at 05/09/2023 1118 Last data filed at 05/08/2023 1844 Gross per 24 hour  Intake 419 ml  Output 450 ml  Net -31 ml     Assessment/Plan:  77 y.o. female is s/p right groin postop infection with incision and drainage of wound with wound VAC exchange x 4  8 Days Post-Op   PLAN:  Dr Vilinda Flake and myself changed the wound vac at the bedside today. Patient tolerated well. Wound is reducing in size.  Plan is to change wound vac at the bedside again on Thursday for possible discharge home on Friday if medically stable.   DVT prophylaxis:  Eliquis 2.5 mg twice daily, aspirin 81 mg daily, Plavix 75 mg daily.    [name] [facility] 05/09/2023 11:18 AM

## 2023-05-09 NOTE — Progress Notes (Signed)
Physical Therapy Treatment Patient Details Name: Dawn Bruce MRN: [id number] DOB: [birth year] Today's Date: 05/09/2023   History of Present Illness Pt is a 77 years old admitted 04/22/23 for cellulitis of right groin. Procedure dated 04/25/23 for (1) excisional debridement of right groin and (2) application of VAC wound dressing; repeat irrigation/debridement of R groin 12/10    PT Comments  Pt alert and oriented x4, reported 4/10 pain in R groin. Pt was able to perform log rolling and return to supine with supervision, extra time and use of bed rails. Able to don L sock at EOB with set up only, assisted with R sock due to pain. She ambulated ~252ft with RW and supervision, did endorse some fatigue still compared to baseline. Also able to stand pivot to [facility] supervision and void, performed pericare modI. Returned to bed with all needs in reach and instructed in 3 exercises for HEP (ankle pumps, heel slides, hip abd/add) pt verbalized and demonstrated understanding. The patient would benefit from further skilled PT intervention to continue to progress towards goals.     If plan is discharge home, recommend the following: A little help with walking and/or transfers;A little help with bathing/dressing/bathroom;Help with stairs or ramp for entrance;Assist for transportation   Can travel by private vehicle        Equipment Recommendations  None recommended by PT    Recommendations for Other Services       Precautions / Restrictions Precautions Precautions: Fall     Mobility  Bed Mobility Overal bed mobility: Needs Assistance Bed Mobility: Rolling, Sit to Sidelying, Sit to Supine Rolling: Supervision, Used rails     Sit to supine: Supervision Sit to sidelying: Supervision, HOB elevated, Used rails General bed mobility comments: pt able to perform log roll without cues    Transfers Overall transfer level: Needs assistance Equipment used: Rolling walker (2 wheels) Transfers: Sit  to/from Stand, Bed to chair/wheelchair/BSC Sit to Stand: Supervision Stand pivot transfers: Supervision Step pivot transfers: Supervision            Ambulation/Gait Ambulation/Gait assistance: Supervision Gait Distance (Feet): 270 Feet Assistive device: Rolling walker (2 wheels) Gait Pattern/deviations: Step-through pattern           Stairs             Wheelchair Mobility     Tilt Bed    Modified Rankin (Stroke Patients Only)       Balance Overall balance assessment: Needs assistance Sitting-balance support: No upper extremity supported, Feet supported Sitting balance-Leahy Scale: Good     Standing balance support: Bilateral upper extremity supported Standing balance-Leahy Scale: Good                              Cognition Arousal: Alert Behavior During Therapy: WFL for tasks assessed/performed Overall Cognitive Status: Within Functional Limits for tasks assessed                                          Exercises      General Comments        Pertinent Vitals/Pain Pain Assessment Pain Assessment: 0-10 Pain Score: 4  Pain Location: R groin region Pain Descriptors / Indicators: Aching, Constant Pain Intervention(s): Limited activity within patient's tolerance, Monitored during session, Repositioned    Home Living  Prior Function            PT Goals (current goals can now be found in the care plan section) Progress towards PT goals: Progressing toward goals    Frequency    Min 1X/week      PT Plan      Co-evaluation              AM-PAC PT "6 Clicks" Mobility   Outcome Measure  Help needed turning from your back to your side while in a flat bed without using bedrails?: A Little Help needed moving from lying on your back to sitting on the side of a flat bed without using bedrails?: A Little Help needed moving to and from a bed to a chair (including a  wheelchair)?: None Help needed standing up from a chair using your arms (e.g., wheelchair or bedside chair)?: None Help needed to walk in hospital room?: None Help needed climbing 3-5 steps with a railing? : A Little 6 Click Score: 21    End of Session Equipment Utilized During Treatment: Gait belt Activity Tolerance: Patient tolerated treatment well Patient left: in bed;with call bell/phone within reach;with bed alarm set;with family/visitor present Nurse Communication: Mobility status PT Visit Diagnosis: Unsteadiness on feet (R26.81);Other abnormalities of gait and mobility (R26.89);Muscle weakness (generalized) (M62.81);Difficulty in walking, not elsewhere classified (R26.2);Pain Pain - part of body: Leg     Time: 4098-1191 PT Time Calculation (min) (ACUTE ONLY): 19 min  Charges:    $Therapeutic Activity: 8-22 mins PT General Charges $$ ACUTE PT VISIT: 1 Visit                     Olga Coaster PT, DPT 12:14 PM,05/09/23

## 2023-05-10 DIAGNOSIS — L03314 Cellulitis of groin: Secondary | ICD-10-CM | POA: Diagnosis not present

## 2023-05-10 DIAGNOSIS — L089 Local infection of the skin and subcutaneous tissue, unspecified: Secondary | ICD-10-CM | POA: Diagnosis not present

## 2023-05-10 DIAGNOSIS — B9622 Other specified Shiga toxin-producing Escherichia coli [E. coli] (STEC) as the cause of diseases classified elsewhere: Secondary | ICD-10-CM

## 2023-05-10 DIAGNOSIS — T8131XA Disruption of external operation (surgical) wound, not elsewhere classified, initial encounter: Secondary | ICD-10-CM | POA: Diagnosis not present

## 2023-05-10 LAB — CBC
HCT: 25.9 % — ABNORMAL LOW (ref 36.0–46.0)
Hemoglobin: 8.2 g/dL — ABNORMAL LOW (ref 12.0–15.0)
MCH: 29.1 pg (ref 26.0–34.0)
MCHC: 31.7 g/dL (ref 30.0–36.0)
MCV: 91.8 fL (ref 80.0–100.0)
Platelets: 135 10*3/uL — ABNORMAL LOW (ref 150–400)
RBC: 2.82 MIL/uL — ABNORMAL LOW (ref 3.87–5.11)
RDW: 16.6 % — ABNORMAL HIGH (ref 11.5–15.5)
WBC: 6.8 10*3/uL (ref 4.0–10.5)
nRBC: 0 % (ref 0.0–0.2)

## 2023-05-10 LAB — ADAMTS13 ACTIVITY REFLEX

## 2023-05-10 LAB — CREATININE, SERUM
Creatinine, Ser: 2.72 mg/dL — ABNORMAL HIGH (ref 0.44–1.00)
GFR, Estimated: 17 mL/min — ABNORMAL LOW (ref >60)

## 2023-05-10 LAB — ADAMTS13 ACTIVITY: Adamts 13 Activity: 47.1 % — ABNORMAL LOW (ref >66.8)

## 2023-05-10 MED ORDER — HYDROMORPHONE HCL 1 MG/ML IJ SOLN
1.0000 mg | Freq: Once | INTRAMUSCULAR | Status: AC
  Administered 2023-05-10: 1 mg via INTRAVENOUS
  Filled 2023-05-10: qty 1

## 2023-05-10 MED ORDER — AMOXICILLIN-POT CLAVULANATE 500-125 MG PO TABS
1.0000 | ORAL_TABLET | Freq: Two times a day (BID) | ORAL | Status: DC
  Administered 2023-05-10 – 2023-05-13 (×6): 1 via ORAL
  Filled 2023-05-10 (×7): qty 1

## 2023-05-10 NOTE — Progress Notes (Signed)
Date of Admission:  04/22/2023     ID: Kenasia Brackens is a 77 y.o. female  Principal Problem:   Cellulitis of right groin Active Problems:   Chronic pain syndrome   Essential hypertension   HLD (hyperlipidemia)   Chronic diastolic heart failure (HCC)   Sepsis (HCC)   CAD (coronary artery disease)   Chronic kidney disease, stage 3a (HCC)   Peritoneal hematoma   Pleural effusion   Abnormal LFTs   Hypokalemia   Atrial fibrillation, chronic (HCC)   Diarrhea   HFrEF (heart failure with reduced ejection fraction) (HCC)   Severe sepsis (HCC)   Infected wound   Cellulitis of right thigh   Acute kidney injury superimposed on stage 3a chronic kidney disease (HCC)   Metabolic acidosis   Overweight (BMI 25.0-29.9)   Thrombocytopenia (HCC)   S/P thoracentesis   Antibiotic 12/2-12/9- cefepime + linezolid 12/9= 12/16 zosyn 12/16 cipro  Daptomycin  Drena Rozek is a 77 y.o. female with a history of CAD, HTN, LD was recently in [facility] between 11/21-11/29 for chest pain and found to have STEMI, underwent angio on 11/22 and ostial LADstent placement- complicated by retroperitoneal hematoma, hemorrhagic shock needing  repair of rt external iliac and circumflex artery by Dr.Esco on 11/222/4. She also received PRBC and was in ICU- She was discharge don 04/20/23 and called vascular who prescribed keflex on 11/30. She came back to the ED on 12/1 with infected wound rt groin. Culture sent and was started on Iv vanco and ceftriaxone. On 12/15 IV zosyn changed to PO cipro and augmentin  Subjective: She is feeling better Had wound vac change  Medications:   alum & mag hydroxide-simeth  15 mL Oral Once   amoxicillin-clavulanate  1 tablet Oral BID   apixaban  2.5 mg Oral BID   aspirin EC  81 mg Oral Daily   atorvastatin  40 mg Oral Daily   carvedilol  12.5 mg Oral BID WC   Chlorhexidine Gluconate Cloth  6 each Topical Daily   ciprofloxacin  500 mg Oral Q breakfast   clopidogrel  75 mg Oral Daily    ezetimibe  10 mg Oral Daily   feeding supplement  237 mL Oral BID BM   fentaNYL  1 patch Transdermal Q72H   pregabalin  75 mg Oral Daily   sodium chloride flush  10-40 mL Intracatheter Q12H   traZODone  50 mg Oral QHS    Objective: Vital signs in last 24 hours: Patient Vitals for the past 24 hrs:  BP Temp Temp src Pulse Resp SpO2 Weight  05/10/23 0920 (!) 157/63 98.3 F (36.8 C) Oral 80 18 96 % --  05/10/23 0725 -- -- -- -- -- 96 % --  05/10/23 0500 -- -- -- -- -- -- 69.6 kg  05/10/23 0337 (!) 146/59 98.7 F (37.1 C) Oral 97 18 96 % --  05/09/23 2005 (!) 164/52 98.6 F (37 C) Oral 96 18 99 % --  05/09/23 2000 -- -- -- -- -- 98 % --      PHYSICAL EXAM:  General: awake and alert Chest b/l air entry Decreased let base [id number] Rt groin wound [place], healthy 05/10/23     12/12     Extremities: bruising thighs Skin: as above Lymph: Cervical, supraclavicular normal. Neurologic: Grossly non-focal  Lab Results    Latest Ref Rng & Units 05/10/2023    1:47 PM 05/09/2023    5:30 AM 05/08/2023    3:38 AM  CBC  WBC 4.0 - 10.5 K/uL 6.8  7.0  7.1   Hemoglobin 12.0 - 15.0 g/dL 8.2  7.8  8.3   Hematocrit 36.0 - 46.0 % 25.9  24.1  25.2   Platelets 150 - 400 K/uL 135  69  43        Latest Ref Rng & Units 05/10/2023    6:45 AM 05/09/2023    5:30 AM 05/08/2023    3:38 AM  CMP  Glucose 70 - 99 mg/dL  295  621   BUN 8 - 23 mg/dL  69  83   Creatinine 3.08 - 1.00 mg/dL 6.57  8.46  9.62   Sodium 135 - 145 mmol/L  141  140   Potassium 3.5 - 5.1 mmol/L  4.4  4.3   Chloride 98 - 111 mmol/L  106  108   CO2 22 - 32 mmol/L  25  24   Calcium 8.9 - 10.3 mg/dL  8.4  8.4       Microbiology: Wound culture E.coli, proteus morganella Surgical culture enterococcus fecalis     Assessment/Plan: Infected groin wound at the site of surgery with dehiscence of wound and surrounding cellulitis Culture polymicrobial infection ( 3 gram neg rods and enteococcus debridement done  .has wound vac On  zosyn day 12 of antibiotic which was DC on 12/15 due to thrombocytopenia Currently  CIPRO+ augmentin, but heme onc wants to avoid any betalactam due to thrombocytopenia  augmentin changed to  dapto- but augmentin less likely to cause thrombocytopenia when compared to zosyn Platelet has normalized- will restart augmentin and observe for 2 days  If platelet declines will have to switch back to daptomycin Antibiotic until 05/20/23   AKI on CKD  - seen by nephrologist- multifactorial etiology- IV contrast, diuretics - improving  Left pleural effusion- s/p thoracentesis   Hematoma both retroperitoneal and superficial at the site of cardiac cath    Recent hemorrhagic shock needing PRBc   Injury to external iliac artery on the right which was repaired   CAD s/p stent ( restenosis of ostial LAD stent)? Afib   Discussed the management with patient and hospitalist

## 2023-05-10 NOTE — Progress Notes (Signed)
Progress Note    05/10/2023 9:12 AM 9 Days Post-Op  Subjective:  Dawn Bruce is a 77 yo female who presents to [facility] emergency room with post operative right groin infection.  Patient is now postop day 13 from right groin incision and drainage with wound washout and wound VAC placement for the fourth time. Wound vac change at the bedside today with IV Dilaudid 1 mg. Patient tolerated well. Patient is resting comfortably in bed this afternoon.  Wound VAC is in place and working well.  Noted serosanguineous drainage to canister and the about 35 mL before changing it.  Patient does endorse some pain and soreness to her right groin but states it is manageable.  Patient remains on her 2.5 mg of Eliquis twice daily for her atrial fibrillation and continues on ASA 81 mg daily and Plavix 75 mg daily post surgical repair of her right femoral artery.    Vitals:   05/10/23 0337 05/10/23 0725  BP: (!) 146/59   Pulse: 97   Resp: 18   Temp: 98.7 F (37.1 C)   SpO2: 96% 96%   Physical Exam: Cardiac:  Hx of Atrial Fibrillation. Regular Rate  Lungs: Bilateral lungs with diminished breath sounds bases. Mild Dyspnea with cough. No wheezing noted. Non Labored breathing this morning. No oxygen required.  Incisions:  Right groin with wound VAC in place and working well.  Maintain suction and noted 60 mL serosanguineous drainage now. Extremities:  Bilateral lower extremities warm to touch. Palpable pulses. Noted +1 edema to right lower extremity.  Abdomen:  Positive bowel sounds throughout, soft and non tender and non distended.  Neurologic: AAOX4, Follows commands  CBC    Component Value Date/Time   WBC 7.0 05/09/2023 0530   RBC 2.71 (L) 05/09/2023 0530   HGB 7.8 (L) 05/09/2023 0530   HGB 13.6 06/01/2022 0000   HCT 24.1 (L) 05/09/2023 0530   HCT 40.5 06/01/2022 0000   PLT 69 (L) 05/09/2023 0530   PLT 132 (L) 06/01/2022 0000   MCV 88.9 05/09/2023 0530   MCV 90 06/01/2022 0000   MCH 28.8  05/09/2023 0530   MCHC 32.4 05/09/2023 0530   RDW 16.1 (H) 05/09/2023 0530   RDW 12.8 06/01/2022 0000   LYMPHSABS 0.5 (L) 05/06/2023 1338   LYMPHSABS 0.7 08/30/2021 0917   MONOABS 0.9 05/06/2023 1338   EOSABS 0.3 05/06/2023 1338   EOSABS 0.0 08/30/2021 0917   BASOSABS 0.0 05/06/2023 1338   BASOSABS 0.0 08/30/2021 0917    BMET    Component Value Date/Time   NA 141 05/09/2023 0530   NA 147 (H) 06/01/2022 0000   K 4.4 05/09/2023 0530   CL 106 05/09/2023 0530   CO2 25 05/09/2023 0530   GLUCOSE 104 (H) 05/09/2023 0530   BUN 69 (H) 05/09/2023 0530   BUN 23 06/01/2022 0000   CREATININE 2.72 (H) 05/10/2023 0645   CALCIUM 8.4 (L) 05/09/2023 0530   GFRNONAA 17 (L) 05/10/2023 0645   GFRAA 54 (L) 10/08/2019 1419    INR    Component Value Date/Time   INR 1.5 (H) 04/22/2023 1540     Intake/Output Summary (Last 24 hours) at 05/10/2023 0912 Last data filed at 05/09/2023 2000 Gross per 24 hour  Intake --  Output 600 ml  Net -600 ml     Assessment/Plan:  77 y.o. female is s/p right groin postop infection with incision and drainage of wound with wound VAC exchange x 5   9 Days Post-Op   PLAN:  Wound VAC change at the bedside today.  Significant healing is noted.  No signs or symptoms of infection or bleeding noted. Wound VAC to be changed twice a week.  We recommend Monday and Thursdays. Patient's daughter at the bedside and informs that the patient's creatinine is now 2.7 this morning and she will be going home later this afternoon. Patient's home VAC is in a box at the nurses station for her to be discharged home with. Patient has 2-week follow-up in the system for return to vein and vascular for wound check. Patient needs to be discharged on her Eliquis 2.5 mg twice daily, aspirin 81 mg daily and Plavix 75 mg daily.  DVT prophylaxis:  Eliquis 2.5 mg twice daily, aspirin 81 mg daily, Plavix 75 mg daily.    [name] [facility] 05/10/2023 9:12 AM

## 2023-05-10 NOTE — Plan of Care (Signed)
  Problem: Clinical Measurements: Goal: Diagnostic test results will improve Outcome: Progressing   Problem: Clinical Measurements: Goal: Respiratory complications will improve Outcome: Progressing   Problem: Clinical Measurements: Goal: Cardiovascular complication will be avoided Outcome: Progressing   Problem: Activity: Goal: Risk for activity intolerance will decrease Outcome: Progressing   

## 2023-05-10 NOTE — Progress Notes (Signed)
PULMONOLOGY         Date: 05/10/2023,   MRN# [id number] Dawn Bruce [birth year]     AdmissionWeight: 54.4 kg                 CurrentWeight: 69.6 kg  Referring provider: Dr Gilda Crease   CHIEF COMPLAINT:   Complete atelectasis of left lung with bilateral pleural effusions.    HISTORY OF PRESENT ILLNESS   77 year old female with a history of chronic GERD, basal cell carcinoma squamous cell carcinoma history of NSTEMI diastolic CHF, CAD status post recent PCI with DES, chronic A-fib on Eliquis, essential hypertension, dyslipidemia CKD 3A chronic pain syndrome remote perforated appendicitis, hospitalized November 2024 with non-STEMI.  Post left heart cath had developed retroperitoneal hematoma and hemorrhagic shock with complications of iliac artery laceration.  Status post surgical repair via vascular surgery service.  Had outpatient therapy with antibiotics with failure came in with transient hypotension and circulatory shock.  Found to have elevated cardiac biomarkers and acute on chronic CHF.  Vascular surgery was reconsulted today and PCCM consultation was placed to incidental finding of complete atelectasis of the left lung with surrounding bilateral pleural effusions worse on the left.  04/25/23- patient evaluated in PACU post op , appears to be mildy sedated still. Mildy hypotensive at this moment.  Lung sounds much improved post 1.6L left pleural space aspiration.  04/26/23- patient seen and examined , [name] (daughter) at bedside.  Patient receiving blood transfusion.  Lung sounds are clear , vitals are stable.  Hypoalbulminemia continues. Will order urine protein and pre albumin levels,  because patient shares she's eating well.  CXR in am to check interval effusion.  04/27/23- patient seen at bedside, husband and daughter present.  For surgery wound washout and vac exchange today.   Vitals stable , room air spO2 99%     04/30/23- patient seen at bedside.  Has  additional surgery planned with debridement.  CXR with recurring effusion and associated compressive atelelctasis.   Ideally would like to increase diuresis and recruitment maunevers.  E. Faecallis noted on wound culture.  I discussed case with Dr Avon Gully infectious disease specialist today.  Dr Dareen Piano is also considering pleurX catheter which patient may need if this does not resolve with medical management. She already had multiple thoracentesis. No noew labs are available for review but are in process. Will order purewick for urination due to physical weakness.      05/01/23-  patient reports improvement post diuresis.  Will obtain interval CXR today.  We discussed PleurX permanent tunneled catheter and she declined to have this done at the moment.  She is on room air with normoxia.  She developed mild contraction and we will take diuretic holiday.   05/02/23- patient with severe hypotension.  I have dcd her beta blocker today and imdur. She is now getting IV fluids and is able to speak.  Will start midodrine 5mg  po tid.  05/03/23- patient is now no longer hypotensive.  She had AKI and this is very likely related to circulatory shock over past 24h.  She had fluid via IV and I'm concerned regarding reacculumation of pleural fluid so I have dcd this now since she is hypertensive.  There is nephrology consult in progress.  05/04/23- patient is improved clinically, she has improved vital signs,  she does have AKI but she is making light yellow urine.  She has nephrology evaluation.  There is consideration for HD.  05/06/23- patient clinically  reporting mildy worsening SOB.  Repeat CXR reviewed by me with improved right hemithorax but worse on left with reaccumulated effusion moderate.  She has progressive thrombocytopenia with today platelets <51, does not appear toxic at all and is lucid alert and appropriate.  She has PCI x2 in the past.  Patient is uremic and am concerned regarding potential for  bleeding but same time concern for stent thrombosis/clotting with AF/CAD.  Have discussed with team and will ask for additional evaluation with cardiology.  Dr Chipper Herb has also notified oncology to help with blood abnormalities.   05/07/23- patient continues to produce adequate urine light yellow in color renal function is improved, respiratory status however seems  more labored today.  Her pleural effusion has re-accumulated and we will order US thoracentesis today. She had oncology and cardiology evaluation.  It seems thrombocytopenia may be due to Zosyn.   She had vascular surgery evaluation today.   05/08/23- patient seen at bedside.  She reports sudden onset of cough after eating yesterday, suspect due to aspiration.  She had some nuts while laying in bed. We discussed this and asked to perform aspiration precautions with meals. She had Left thoracentesis with improved CXR post procedure.  Today we plan to drain right pleural space. She has improved renal function and is urinating adequately.  She has multiple specialists following her care, appreciate everyone involved.  Her vitals appear stable.  Platelet counts have improved.  She is ambulatory with PT/OT.  Overall she is clinically improved.  05/09/23- patient is further improved.  She is ambulatory without dyspnea.  AKI is improved. CXR reviwed and improved. Plan for OR in am.  05/10/23- patient further improved.  Bloodwork is better with improved renal function and platelet count. Left hemithorax with reduced lung sounds.  She reports dressing from groin is loose. Plan for cxr in am. She on cipro and augmentin. Using IS and working with PT.    PAST MEDICAL HISTORY   Past Medical History:  Diagnosis Date   Actinic keratosis    Chronic pain    GERD (gastroesophageal reflux disease) ?   Taking Pepcid   History of basal cell carcinoma (BCC) 05/02/2021   right upper back paraspinal   History of SCC (squamous cell carcinoma) of skin 09/10/2019    left dorsum wrist ED&C done 10/28/19   History of SCC (squamous cell carcinoma) of skin 03/04/2020   right dorsum hand   History of squamous cell carcinoma in situ (SCCIS) 03/04/2020   left dorsum wrist  ED&C   History of squamous cell carcinoma in situ (SCCIS) 03/04/2020   right medial infraorbital  ED&C 04/28/2020   History of squamous cell carcinoma in situ (SCCIS) 10/28/2019   right dorsum hand proximal lateral   History of squamous cell carcinoma in situ (SCCIS) 10/28/2019   right dorsum hand proximal medial   Hyperlipidemia    Hypertension    NSTEMI (non-ST elevated myocardial infarction) (HCC) 04/12/2023   Renal disorder    Squamous cell carcinoma in situ 10/31/2021   left distal  tricep, EDC   Squamous cell carcinoma of skin 08/28/2022   Right volar forearm - [facility]     SURGICAL HISTORY   Past Surgical History:  Procedure Laterality Date   ABDOMINAL HYSTERECTOMY     APPENDECTOMY  April 2023   Burst appendix   APPLICATION OF WOUND VAC Right 04/25/2023   Procedure: APPLICATION OF WOUND VAC;  Surgeon: Renford Dills, MD;  Location: [facility] OR[facility];  Service: Vascular;  Laterality: Right;   APPLICATION OF WOUND VAC Right 04/27/2023   Procedure: WOUND VAC EXCHANGE;  Surgeon: Renford Dills, MD;  Location: [facility];  Service: Vascular;  Laterality: Right;   APPLICATION OF WOUND VAC Right 05/01/2023   Procedure: APPLICATION OF WOUND VAC;  Surgeon: Renford Dills, MD;  Location: [facility];  Service: Vascular;  Laterality: Right;   AUGMENTATION MAMMAPLASTY Bilateral    25-30 years ago   CARDIOVERSION N/A 12/01/2020   Procedure: CARDIOVERSION;  Surgeon: Debbe Odea, MD;  Location: [facility];  Service: Cardiovascular;  Laterality: N/A;   CAROTID ARTERY ANGIOPLASTY Right 1995(approximate)   CAROTID ENDARTERECTOMY  2001   CORONARY STENT INTERVENTION N/A 04/13/2023   Procedure: CORONARY STENT INTERVENTION;  Surgeon: Marcina Millard, MD;  Location: [facility];   Service: Cardiovascular;  Laterality: N/A;   COSMETIC SURGERY     ELBOW SURGERY Left    ENDARTERECTOMY FEMORAL Right 04/13/2023   Procedure: Right groin exploration with repair of right external iliac artery and right circumflex artery;  Surgeon: Bertram Denver, MD;  Location: [facility];  Service: Vascular;  Laterality: Right;   FRACTURE SURGERY  2016   Arm   INCISION AND DRAINAGE OF WOUND Right 04/25/2023   Procedure: IRRIGATION AND DEBRIDEMENT WOUND;  Surgeon: Renford Dills, MD;  Location: [facility];  Service: Vascular;  Laterality: Right;   INCISION AND DRAINAGE OF WOUND Right 04/27/2023   Procedure: IRRIGATION AND DEBRIDEMENT WOUND;  Surgeon: Renford Dills, MD;  Location: [facility];  Service: Vascular;  Laterality: Right;   INCISION AND DRAINAGE OF WOUND Right 05/01/2023   Procedure: IRRIGATION AND DEBRIDEMENT WOUND;  Surgeon: Renford Dills, MD;  Location: [facility];  Service: Vascular;  Laterality: Right;   LEFT HEART CATH AND CORONARY ANGIOGRAPHY N/A 04/13/2023   Procedure: LEFT HEART CATH AND CORONARY ANGIOGRAPHY;  Surgeon: Marcina Millard, MD;  Location: [facility];  Service: Cardiovascular;  Laterality: N/A;   THORACENTESIS N/A 12/27/2020   Procedure: Alanson Puls;  Surgeon: Josephine Igo, DO;  Location: [facility];  Service: Pulmonary;  Laterality: N/A;   TONSILLECTOMY     TUBAL LIGATION  ?     FAMILY HISTORY   Family History  Problem Relation Age of Onset   Cancer Mother    Cancer Father    Cancer Sister    Breast cancer Neg Hx      SOCIAL HISTORY   Social History   Tobacco Use   Smoking status: Former    Current packs/day: 0.00    Average packs/day: 0.3 packs/day for 15.0 years (3.8 ttl pk-yrs)    Types: Cigarettes    Start date: 9    Quit date: 35    Years since quitting: 34.9    Passive exposure: Past   Smokeless tobacco: Never   Tobacco comments:    quit around 1990-1995 (?)  Vaping Use   Vaping status: Never Used   Substance Use Topics   Alcohol use: Yes    Alcohol/week: 14.0 standard drinks of alcohol    Types: 14 Standard drinks or equivalent per week    Comment: 2 scotch shots   Drug use: Never     MEDICATIONS    Home Medication:     Current Medication:  Current Facility-Administered Medications:    acetaminophen (TYLENOL) tablet 650 mg, 650 mg, Oral, Q6H PRN, Schnier, Latina Craver, MD, 650 mg at 05/05/23 1809   albuterol (PROVENTIL) (2.5 MG/3ML) 0.083% nebulizer solution 2.5 mg, 2.5 mg, Nebulization, Q4H  PRN, Gilda Crease, Latina Craver, MD, 2.5 mg at 05/10/23 0724   alum & mag hydroxide-simeth (MAALOX/MYLANTA) 200-200-20 MG/5ML suspension 15 mL, 15 mL, Oral, Once, Schnier, Latina Craver, MD   apixaban Everlene Balls) tablet 2.5 mg, 2.5 mg, Oral, BID, Schnier, Latina Craver, MD, 2.5 mg at 05/10/23 2952   aspirin EC tablet 81 mg, 81 mg, Oral, Daily, Schnier, Latina Craver, MD, 81 mg at 05/10/23 0854   atorvastatin (LIPITOR) tablet 40 mg, 40 mg, Oral, Daily, Karna Christmas, Bobbie Valletta, MD, 40 mg at 05/10/23 0854   benzonatate (TESSALON) capsule 200 mg, 200 mg, Oral, TID PRN, Vida Rigger, MD, 200 mg at 05/09/23 1703   carvedilol (COREG) tablet 12.5 mg, 12.5 mg, Oral, BID WC, Marrion Coy, MD, 12.5 mg at 05/10/23 8413   Chlorhexidine Gluconate Cloth 2 % PADS 6 each, 6 each, Topical, Daily, Schnier, Latina Craver, MD, 6 each at 05/10/23 0855   ciprofloxacin (CIPRO) tablet 500 mg, 500 mg, Oral, Q breakfast, Marrion Coy, MD, 500 mg at 05/10/23 2440   clopidogrel (PLAVIX) tablet 75 mg, 75 mg, Oral, Daily, Schnier, Latina Craver, MD, 75 mg at 05/10/23 0853   DAPTOmycin (CUBICIN) 600 mg in sodium chloride 0.9 % IVPB, 8 mg/kg, Intravenous, Q48H, Aleda Grana, RPH, Last Rate: 124 mL/hr at 05/09/23 1737, 600 mg at 05/09/23 1737   ezetimibe (ZETIA) tablet 10 mg, 10 mg, Oral, Daily, Schnier, Latina Craver, MD, 10 mg at 05/10/23 0854   feeding supplement (ENSURE ENLIVE / ENSURE PLUS) liquid 237 mL, 237 mL, Oral, BID BM, Marrion Coy, MD, 237 mL at  05/10/23 0855   fentaNYL (DURAGESIC) 75 MCG/HR 1 patch, 1 patch, Transdermal, Q72H, Schnier, Latina Craver, MD, 1 patch at 05/08/23 1045   oxyCODONE (Oxy IR/ROXICODONE) immediate release tablet 5 mg, 5 mg, Oral, Q6H PRN, Schnier, Latina Craver, MD, 5 mg at 05/04/23 0008   phenylephrine ((USE for PREPARATION-H)) 0.25 % suppository 1 suppository, 1 suppository, Rectal, PRN, Schnier, Latina Craver, MD   pregabalin (LYRICA) capsule 75 mg, 75 mg, Oral, Daily, Marrion Coy, MD, 75 mg at 05/10/23 0854   sodium chloride flush (NS) 0.9 % injection 10-40 mL, 10-40 mL, Intracatheter, Q12H, Schnier, Latina Craver, MD, 10 mL at 05/10/23 0855   sodium chloride flush (NS) 0.9 % injection 10-40 mL, 10-40 mL, Intracatheter, PRN, Schnier, Latina Craver, MD   traZODone (DESYREL) tablet 50 mg, 50 mg, Oral, QHS, Schnier, Latina Craver, MD, 50 mg at 05/09/23 2100   witch hazel-glycerin (TUCKS) pad, , Topical, PRN, Marrion Coy, MD    ALLERGIES   Patient has no known allergies.     REVIEW OF SYSTEMS    Review of Systems:  Gen:  Denies  fever, sweats, chills weigh loss  HEENT: Denies blurred vision, double vision, ear pain, eye pain, hearing loss, nose bleeds, sore throat Cardiac:  No dizziness, chest pain or heaviness, chest tightness,edema Resp:   reports dyspnea chronically , REPORTS ASPIRATION OVERNIGHT WHILE EATING NUTS IN SUPINE POSITION Gi: Denies swallowing difficulty, stomach pain, nausea or vomiting, diarrhea, constipation, bowel incontinence Gu:  Denies bladder incontinence, burning urine Ext:   Denies Joint pain, stiffness or swelling Skin: Denies  skin rash, easy bruising or bleeding or hives Endoc:  Denies polyuria, polydipsia , polyphagia or weight change Psych:   Denies depression, insomnia or hallucinations   Other:  All other systems negative   VS: BP (!) 157/63 (BP Location: Right Wrist)   Pulse 80   Temp 98.3 F (36.8 C) (Oral)   Resp 18   Ht  5' 2.99" (1.6 m)   Wt 69.6 kg   SpO2 96%   BMI 27.19  kg/m      PHYSICAL EXAM    GENERAL:NAD, no fevers, chills, no weakness no fatigue HEAD: Normocephalic, atraumatic.  EYES: Pupils equal, round, reactive to light. Extraocular muscles intact. No scleral icterus.  MOUTH: Moist mucosal membrane. Dentition intact. No abscess noted.  EAR, NOSE, THROAT: Clear without exudates. No external lesions.  NECK: Supple. No thyromegaly. No nodules. No JVD.  PULMONARY: decreased breath sounds with mild rhonchi worse at bases bilaterally.  CARDIOVASCULAR: S1 and S2. Regular rate and rhythm. No murmurs, rubs, or gallops. No edema. Pedal pulses 2+ bilaterally.  GASTROINTESTINAL: Soft, nontender, nondistended. No masses. Positive bowel sounds. No hepatosplenomegaly.  MUSCULOSKELETAL: No swelling, clubbing, or edema. Range of motion full in all extremities.  NEUROLOGIC: Cranial nerves II through XII are intact. No gross focal neurological deficits. Sensation intact. Reflexes intact.  SKIN: No ulceration, lesions, rashes, or cyanosis. Skin warm and dry. Turgor intact.  PSYCHIATRIC: Mood, affect within normal limits. The patient is awake, alert and oriented x 3. Insight, judgment intact.       IMAGING     ASSESSMENT/PLAN    Complete atelectasis of left lung- IMPROVED    - patient s/p diuresis with partial improvement however still has minimal lung sounds on left.   - she does have CKD 3 and may not produce adequate fluid removal via medication alone   - we discussed options for thoracentesis and patient would like to proceed with this procedure.  -incentive spirometry at bedside to be used multiple times daily  - PT/OT - etiology is likely poor nutrition post ICU hospitalization with CHF and CKD all contributing to third spaced interstitial edema , peripheral edema and effusion -repeat thoracentesis 12/15,  have ordered again thoracentesis on right    Bilateral pleural effusions    - s/p throacentesis- multiple times   - consultation for IR with  US guided throacentesis - therapeutic    -patient on eliquis -prealbumin -urine protein -holding diuresis today due to [facility] and will continue to monitor renal function.  I anticipate this should improve with normal BP now. -appreciate nephrology consultation  -05/06/23- CXR with reaccumuation of left effusion - treated with thoracentesis 12/17- ordered right thoracentesis  Severe AKI stage 5    Nephrology on case -appreciate input     - non oliguric at this moment , no HD today per specialist    AF with CAD s/p PCI x 2   - patient on plavix and eliquis     - s/p complicated surgery with multiple transfusions.    -Thrombocytopenia improved    Severe thrombocytopenia   - oncology on case - appreciate input - Dr B   - workup in progress - platelet count trending up   Sepsis due to right groin cellulitis   S/p vascular surgery eval S/p vanco /rocephin  - patient appears to be clinically improved.  - s/p ID revaluation - appreciate input  -E faecalis noted on wound care   Moderate protein calorie malnutrition   - nutritional consultation    - bitemporal wasting and peripheral muscle weakness noted   - contributing to edema/effusion   - complicating tissue healing  -high protein diet --patient aspirated on evening of 05/07/23  Thank you for allowing me to participate in the care of this patient.  Patient/Family are satisfied with care plan and all questions have been answered.    Provider disclosure:  Patient with at least one acute or chronic illness or injury that poses a threat to life or bodily function and is being managed actively during this encounter.  All of the below services have been performed independently by signing provider:  review of prior documentation from internal and or external health records.  Review of previous and current lab results.  Interview and comprehensive assessment during patient visit today. Review of current and previous chest radiographs/CT  scans. Discussion of management and test interpretation with health care team and patient/family.   This document was prepared using Dragon voice recognition software and may include unintentional dictation errors.     Vida Rigger, M.D.  Division of Pulmonary & Critical Care Medicine

## 2023-05-10 NOTE — Plan of Care (Signed)

## 2023-05-10 NOTE — TOC Progression Note (Signed)
Transition of Care [facility]) - Progression Note    Patient Details  Name: Dawn Bruce MRN: [id number] Date of Birth: 1945/07/01  Transition of [facility]) CM/SW Contact  Chapman Fitch, RN Phone Number: 05/10/2023, 11:51 AM  Clinical Narrative:     Per MD patient will need IV antibiotics at discharge.  Awaiting ID Patient is already set up [facility] home health.  Referral for infusion made to [facility] with [employer]. Patients vac was changed today.  Her home vac supplies are being stored in the dictation area.  Per Huel Cote with vascular home vac to be changed on Mondays and Thursdays        Expected Discharge Plan and Services                                               Social Determinants of Health (SDOH) Interventions SDOH Screenings   Food Insecurity: No Food Insecurity (04/23/2023)  Housing: Low Risk  (05/03/2023)  Transportation Needs: No Transportation Needs (04/23/2023)  Utilities: Not At Risk (04/23/2023)  Alcohol Screen: Low Risk  (12/08/2022)  Depression (PHQ2-9): Low Risk  (12/08/2022)  Financial Resource Strain: Low Risk  (10/13/2022)  Physical Activity: Insufficiently Active (10/13/2022)  Social Connections: Unknown (10/13/2022)  Stress: No Stress Concern Present (10/13/2022)  Tobacco Use: Medium Risk (04/27/2023)    Readmission Risk Interventions     No data to display

## 2023-05-10 NOTE — Progress Notes (Signed)
Pharmacy Antibiotic Note  Dawn Bruce is a 77 y.o. female admitted on 04/22/2023 with groin wound.  Pharmacy has been consulted for daptomycin dosing. Patient on several antibiotics.  Recently she has developed AKI and thromboyctopenia  vancomycin 12/1 >> 12/2 ceftriaxone 12/1 >> 12/2 cefepime 12/2 >>12/9  linezolid 12/2 >>12/9 Pip/tazo 12/9 >>12/15 Augmentin 12/16 1 dose Cipro 12/16>> Daptomycin 12/16 >>  Today, 05/10/2023 Day #18 antibiotics, currently on daptomycin and ciprofloxacin. status post 7 days of piperacillin/tazobactam Renal: SCr 2.72 - improving WBC WNL Afebrile 12/16 CK 39 12/4 culture from OR: E faecalis  12/2 wound cx: E coli, M morganii, P mirabilis Thrombocytopenia of unknown etiology s/p recent linezolid (and suspect drug levels lingered with AKI) and oncology like to avoid piperacillin/tazobactam and other penicillins for time being.    Platelet count 69 (improving)  Plan: Continue ciprofloxacin to cover gram negatives and add daptomycin for time being to cover enterococcus (avoid vanco and linezolid).  Daptomycin 600mg  IV q48h (8mg /kg) On atorvastatin 40mg  Weekly CK, due 12/23 Follow labs - renal function, platelets, CK  Height: 5' 2.99" (160 cm) Weight: 69.6 kg (153 lb 7 oz) IBW/kg (Calculated) : 52.38  Temp (24hrs), Avg:98.5 F (36.9 C), Min:98.3 F (36.8 C), Max:98.7 F (37.1 C)  Recent Labs  Lab 05/06/23 0509 05/06/23 1338 05/07/23 0540 05/08/23 0338 05/09/23 0530 05/10/23 0645  WBC 6.3 7.2 6.3 7.1 7.0  --   CREATININE 4.67*  --  4.46* 4.08* 3.24* 2.72*    Estimated Creatinine Clearance: 15.7 mL/min (A) (by C-G formula based on SCr of 2.72 mg/dL (H)).    No Known Allergies   Thank you for allowing pharmacy to be a part of this patient's care.  Juliette Alcide, PharmD, BCPS, BCIDP Work Cell: [phone] 05/10/2023 1:16 PM

## 2023-05-10 NOTE — Progress Notes (Signed)
Progress Note   Patient: Dawn Bruce ZOX:[id number] DOB: [birth year] DOA: 04/22/2023     18 DOS: the patient was seen and examined on 05/10/2023   Brief hospital course:  Dawn Bruce is a 77 y.o. female with medical history significant of dCHF, CAD (s/p of recent DES), A fib on Eliquis, HTN, HLD, CDK-3a, chronic pain syndrome, multiple skin cancer, perforated appendicitis, former smoker, who presents with right groin pain.  Patient was recently hospitalized from 11/21 - 11/29 due to non-STEMI. Patient is s/p LHC with DES placement. Pt developed retroperitoneal hematoma & hemorrhagic shock due to complication of Iliac artery laceration during left heart cath of right groin. Pt underwent surgical repair of external iliac artery by VVS on 11/23.  Presented back to ED 12/1 due to increased redness, swelling, and pain at incision site of the cath. Failed outpatient Abx x1 dose.  Hypotensive to 80/50 on presentation. 12/2: pulmonology, and IR consulted to address pleural effusion. ID consulted to address abx course. Transitioned to linezolid. Vascular evaluated and recommended debridement. 12/3: Thoracentesis removed 1.6L left side 12/4:stable ORA, afebrile. Wound clinically greatly improved. Debridement today. Plan to stop heparin gtt and return to eliquis + DAPT s/p procedure 12/6: additional debridement.  Debridement again 12/10. Now on zosyn. Doing well. Respiratory status stable ORA.  12/12.  Patient had a worsening renal function, nephrology consult obtained. 12/14.  Worsening thrombocytopenia, consult hematology oncology to rule out TTS. 12/15.  Thoracentesis removed 1.5 Lon left 12/18 . Improved throbocytopenia     Assessment and Plan:  Acute renal failure on chronic kidney disease stage IIIa. Hypokalemia. Mild metabolic acidosis. Transient hypotension. Renal function continues to show some improvement Serum creatinine peaked at 4.67 which shows a downward trend Blood pressure is  stable Appreciate nephrology input.       Sepsis due to cellulitis of right groin Right groin wound infection secondary to surgical repair of pseudoaneurysm. Sepsis criteria met on admission with WBC 21.3, heart rate 104, RR 29.  Lactic acid normal 1.7.   Sepsis appears to be secondary to right groin surgical wound infection.  Patient had debridement x 2, wound-vac in place Wound culture grow E. coli, Morganella morganii, Proteus Morales, Enterococcus faecalis.  Blood culture negative.  Initially treated with linezolid + cefepime, switched to Zosyn on 12/11.  Dressing change performed by vascular surgery 12/12, wound is getting better with significant granulation. Discussed with vascular surgery and ID, will continue IV antibiotics while in the hospital.   Due to severe thrombocytopenia, most likely caused by Zosyn, antibiotic switched to Cipro and daptomycin per ID.(Continue antibiotic therapy through 05/20/2023) Patient had a dressing changes performed 12/19, wound healing well.     Hypervolemia  Bilateral Pleural effusions Acute on chronic systolic congestive heart failure.  Hx of CAD: s/p recent DES. Last known LVEF of 25 to 30% from a 2D echocardiogram which was done 11/24 Patient initially received IV Lasix, also had a thoracentesis. Volume status has improved. Discussed with cardiology, will continue Plavix, not able to discontinue due to recent drug-eluting stent. Repeated chest x-ray on 12/15 showed moderate pleural effusion.  Pulmonology is following. Thoracentesis removed 1.5 L of fluids on 12/16 on left,     Peritoneal hematoma Anemia acute on chronic. Thrombocytopenia. Hemoglobin dropped down to 7.6 12/11, received another unit of PRBC transfusion.   Normal iron, B12 level borderline, check homocystine level normal.  Hemoglobin much better. Patient developed mild thrombocytopenia of 117 on 12/12, not currently on heparin.   Noted to have  worsening thrombocytopenia with  the lowest platelet count of 39. Patient was seen by hematology interval cytopenia attributed to Zosyn.  No evidence of TTS.  No active bleeding.  Zosyn discontinued. Improved platelet count     Choking episode. Patient had a choking episode the night of 12/16, appears to have improved  Patient does not have a baseline dysphagia.  Continue to follow.     Essential hypertension: Continue carvedilol  Hold ARB due to acute renal failure.     Atrial fibrillation, chronic (HCC): rate controlled On eliquis Increased Coreg to 12.5 mg which is home dose.   Diarrhea: C. difficile negative. Diarrhea improved after giving Imodium.   Chronic pain syndrome -Continue home fentanyl patch and Lyrica   HLD: -lipitor and zetia              Subjective: No new complaints.  Does has at the bedside.  Concerned about discharge plans  Physical Exam: Vitals:   05/10/23 0337 05/10/23 0500 05/10/23 0725 05/10/23 0920  BP: (!) 146/59   (!) 157/63  Pulse: 97   80  Resp: 18   18  Temp: 98.7 F (37.1 C)   98.3 F (36.8 C)  TempSrc: Oral   Oral  SpO2: 96%  96% 96%  Weight:  69.6 kg    Height:       General exam: Appears calm and comfortable  Respiratory system: Decreased breath sounds on right, no crackles. Respiratory effort normal. Cardiovascular system: Irregularly irregular. No JVD, murmurs, rubs, gallops or clicks. No pedal edema. Gastrointestinal system: Abdomen is nondistended, soft and nontender. No organomegaly or masses felt. Normal bowel sounds heard. Central nervous system: Alert and oriented. No focal neurological deficits. Extremities: Symmetric 5 x 5 power. Skin: No rashes, lesions or ulcers, wound VAC in place in right groin Psychiatry: Judgement and insight appear normal. Mood & affect appropriate.    Data Reviewed: Labs reviewed.  Serum creatinine 2.72, Adams 13 activity 47 There are no new results to review at this time.  Family Communication: Plan of care  discussed with patient and her daughter at the bedside.  Disposition: Status is: Inpatient Remains inpatient appropriate because: On IV antibiotics  Planned Discharge Destination: Home with Home Health    Time spent: 33 minutes  Author: Lucile Shutters, MD 05/10/2023 1:44 PM  For on call review [email or web address].

## 2023-05-10 NOTE — Progress Notes (Signed)
[facility]  ROUNDING NOTE   Subjective:   Dawn Bruce  is a 77 y.o. female with medical problems of hypertension, heart failure with preserved ejection fraction, history of pleural effusions, history of diffuse vascular disease including coronary artery disease-history of PCI in LAD stent October 2023, chronic kidney disease, prediabetes, atrial fibrillation status post cardioversion in 2022 followed by Dr. Alyson Locket at [facility] nephrology. Patient presents to the ED with hypotension and suspected wound infection. She has been admitted for Cellulitis of right groin [L03.314] Cellulitis of right thigh [L03.115]  Patient is known to our practice from previous admission.   Patient sitting up in bed Eating greek yogurt, daughter at bedside Able to transfer to [facility] in place, vascular performing dressing changes.   Creatinine 2.72   Objective:  Vital signs in last 24 hours:  Temp:  [98.3 F (36.8 C)-98.7 F (37.1 C)] 98.3 F (36.8 C) (12/19 0920) Pulse Rate:  [80-97] 80 (12/19 0920) Resp:  [18] 18 (12/19 0920) BP: (146-164)/(52-63) 157/63 (12/19 0920) SpO2:  [96 %-99 %] 96 % (12/19 0920) Weight:  [69.6 kg] 69.6 kg (12/19 0500)  Weight change: 2.4 kg Filed Weights   05/08/23 0340 05/09/23 0500 05/10/23 0500  Weight: 63.3 kg 67.2 kg 69.6 kg    Intake/Output: I/O last 3 completed shifts: In: -  Out: 600 [Urine:250; Drains:350]   Intake/Output this shift:  No intake/output data recorded.  Physical Exam: General: NAD  Head: Normocephalic, atraumatic. Moist oral mucosal membranes  Eyes: Anicteric  Lungs:  Clear to auscultation.normal effort  Heart: Regular rate and rhythm  Abdomen:  Soft, nontender  Extremities:  No peripheral edema.  Neurologic: Alert and oriented, moving all four extremities  Skin: No lesions       Basic Metabolic Panel: Recent Labs  Lab 05/04/23 0530 05/05/23 0525 05/06/23 0509 05/07/23 0540 05/08/23 0338 05/09/23 0530  05/10/23 0645  NA 137 137 137 138 140 141  --   K 4.5 4.6 4.6 4.4 4.3 4.4  --   CL 106 106 105 105 108 106  --   CO2 22 21* 22 25 24 25   --   GLUCOSE 105* 106* 105* 107* 118* 104*  --   BUN 88* 91* 92* 87* 83* 69*  --   CREATININE 4.56* 4.67* 4.67* 4.46* 4.08* 3.24* 2.72*  CALCIUM 8.1* 8.3* 8.3* 8.5* 8.4* 8.4*  --   MG 2.2  --   --   --   --   --   --   PHOS 6.8*  --   --   --   --   --   --     Liver Function Tests: No results for input(s): "AST", "ALT", "ALKPHOS", "BILITOT", "PROT", "ALBUMIN" in the last 168 hours.  No results for input(s): "LIPASE", "AMYLASE" in the last 168 hours. No results for input(s): "AMMONIA" in the last 168 hours.  CBC: Recent Labs  Lab 05/06/23 0509 05/06/23 1338 05/07/23 0540 05/08/23 0338 05/09/23 0530  WBC 6.3 7.2 6.3 7.1 7.0  NEUTROABS  --  5.4  --   --   --   HGB 9.5* 9.8* 9.0* 8.3* 7.8*  HCT 28.9* 29.8* 27.6* 25.2* 24.1*  MCV 86.5 87.9 87.1 87.2 88.9  PLT 51* 50* 39* 43* 69*    Cardiac Enzymes: Recent Labs  Lab 05/04/23 0530 05/07/23 1822  CKTOTAL 38 39    BNP: Invalid input(s): "POCBNP"  CBG: No results for input(s): "GLUCAP" in the last 168 hours.   Microbiology:  Results for orders placed or performed during the hospital encounter of 04/22/23  Blood Culture (routine x 2)     Status: None   Collection Time: 04/22/23  3:28 PM   Specimen: BLOOD  Result Value Ref Range Status   Specimen Description BLOOD RIGHT ANTECUBITAL  Final   Special Requests   Final    BOTTLES DRAWN AEROBIC AND ANAEROBIC Blood Culture results may not be optimal due to an excessive volume of blood received in culture bottles   Culture   Final    NO GROWTH 5 DAYS Performed at [facility], [address], [place], [place] [address]    Report Status 04/27/2023 FINAL  Final  C Difficile Quick Screen w PCR reflex     Status: None   Collection Time: 04/22/23  4:19 PM   Specimen: STOOL  Result Value Ref Range Status   C Diff antigen  NEGATIVE NEGATIVE Final   C Diff toxin NEGATIVE NEGATIVE Final   C Diff interpretation No C. difficile detected.  Final    Comment: Performed at [facility], [address] [place], [place] [address]  Gastrointestinal Panel by PCR , Stool     Status: None   Collection Time: 04/22/23  4:25 PM   Specimen: Stool  Result Value Ref Range Status   Campylobacter species NOT DETECTED NOT DETECTED Final   Plesimonas shigelloides NOT DETECTED NOT DETECTED Final   Salmonella species NOT DETECTED NOT DETECTED Final   Yersinia enterocolitica NOT DETECTED NOT DETECTED Final   Vibrio species NOT DETECTED NOT DETECTED Final   Vibrio cholerae NOT DETECTED NOT DETECTED Final   Enteroaggregative E coli (EAEC) NOT DETECTED NOT DETECTED Final   Enteropathogenic E coli (EPEC) NOT DETECTED NOT DETECTED Final   Enterotoxigenic E coli (ETEC) NOT DETECTED NOT DETECTED Final   Shiga like toxin producing E coli (STEC) NOT DETECTED NOT DETECTED Final   Shigella/Enteroinvasive E coli (EIEC) NOT DETECTED NOT DETECTED Final   Cryptosporidium NOT DETECTED NOT DETECTED Final   Cyclospora cayetanensis NOT DETECTED NOT DETECTED Final   Entamoeba histolytica NOT DETECTED NOT DETECTED Final   Giardia lamblia NOT DETECTED NOT DETECTED Final   Adenovirus F40/41 NOT DETECTED NOT DETECTED Final   Astrovirus NOT DETECTED NOT DETECTED Final   Norovirus GI/GII NOT DETECTED NOT DETECTED Final   Rotavirus A NOT DETECTED NOT DETECTED Final   Sapovirus (I, II, IV, and V) NOT DETECTED NOT DETECTED Final    Comment: Performed at [facility], [address] [place], [place] [address]  Blood Culture (routine x 2)     Status: None   Collection Time: 04/23/23  6:31 AM   Specimen: BLOOD  Result Value Ref Range Status   Specimen Description BLOOD BLOOD RIGHT HAND  Final   Special Requests   Final    BOTTLES DRAWN AEROBIC AND ANAEROBIC Blood Culture results may not be optimal due to an inadequate volume of blood  received in culture bottles   Culture   Final    NO GROWTH 5 DAYS Performed at [facility], [address] [place], [place] [address]    Report Status 04/28/2023 FINAL  Final  Aerobic Culture w Gram Stain (superficial specimen)     Status: None   Collection Time: 04/23/23 10:42 AM   Specimen: Groin  Result Value Ref Range Status   Specimen Description   Final    GROIN Performed at [facility], [address] [place], [place] [address]  Special Requests   Final    NONE Performed at [facility], [address] [place], [place] [address]    Gram Stain   Final    RARE WBC PRESENT, PREDOMINANTLY PMN RARE GRAM NEGATIVE RODS RARE GRAM NEGATIVE COCCOBACILLI Performed at [facility], [address], [place], [place] [address]    Culture   Final    MODERATE MORGANELLA MORGANII FEW ESCHERICHIA COLI FEW PROTEUS MIRABILIS    Report Status 04/26/2023 FINAL  Final   Organism ID, Bacteria ESCHERICHIA COLI  Final   Organism ID, Bacteria MORGANELLA MORGANII  Final   Organism ID, Bacteria PROTEUS MIRABILIS  Final      Susceptibility   Escherichia coli - MIC*    AMPICILLIN >=32 RESISTANT Resistant     CEFEPIME <=0.12 SENSITIVE Sensitive     CEFTAZIDIME <=1 SENSITIVE Sensitive     CEFTRIAXONE <=0.25 SENSITIVE Sensitive     CIPROFLOXACIN <=0.25 SENSITIVE Sensitive     GENTAMICIN <=1 SENSITIVE Sensitive     IMIPENEM <=0.25 SENSITIVE Sensitive     TRIMETH/SULFA <=20 SENSITIVE Sensitive     AMPICILLIN/SULBACTAM 16 INTERMEDIATE Intermediate     PIP/TAZO <=4 SENSITIVE Sensitive ug/mL    * FEW ESCHERICHIA COLI   Morganella morganii - MIC*    AMPICILLIN >=32 RESISTANT Resistant     CEFTAZIDIME <=1 SENSITIVE Sensitive     CIPROFLOXACIN <=0.25 SENSITIVE Sensitive     GENTAMICIN <=1 SENSITIVE Sensitive     IMIPENEM 4 SENSITIVE Sensitive     TRIMETH/SULFA <=20 SENSITIVE Sensitive     AMPICILLIN/SULBACTAM 16 INTERMEDIATE Intermediate     PIP/TAZO <=4  SENSITIVE Sensitive ug/mL    * MODERATE MORGANELLA MORGANII   Proteus mirabilis - MIC*    AMPICILLIN <=2 SENSITIVE Sensitive     CEFEPIME <=0.12 SENSITIVE Sensitive     CEFTAZIDIME <=1 SENSITIVE Sensitive     CEFTRIAXONE <=0.25 SENSITIVE Sensitive     CIPROFLOXACIN <=0.25 SENSITIVE Sensitive     GENTAMICIN <=1 SENSITIVE Sensitive     IMIPENEM 2 SENSITIVE Sensitive     TRIMETH/SULFA <=20 SENSITIVE Sensitive     AMPICILLIN/SULBACTAM <=2 SENSITIVE Sensitive     PIP/TAZO <=4 SENSITIVE Sensitive ug/mL    * FEW PROTEUS MIRABILIS  Aerobic/Anaerobic Culture w Gram Stain (surgical/deep wound)     Status: None   Collection Time: 04/25/23  2:41 PM   Specimen: Wound; Body Fluid  Result Value Ref Range Status   Specimen Description   Final    WOUND Performed at [facility], [address] [place], [place] [address]    Special Requests RETROPERITONEAL HEMATOMA SPEC A  Final   Gram Stain   Final    FEW WBC PRESENT,BOTH PMN AND MONONUCLEAR NO ORGANISMS SEEN    Culture   Final    RARE ENTEROCOCCUS FAECALIS NO ANAEROBES ISOLATED Performed at [facility], [address], [place], [place] [address]    Report Status 05/02/2023 FINAL  Final   Organism ID, Bacteria ENTEROCOCCUS FAECALIS  Final      Susceptibility   Enterococcus faecalis - MIC*    AMPICILLIN <=2 SENSITIVE Sensitive     VANCOMYCIN 2 SENSITIVE Sensitive     GENTAMICIN SYNERGY SENSITIVE Sensitive     * RARE ENTEROCOCCUS FAECALIS    Coagulation Studies: No results for input(s): "LABPROT", "INR" in the last 72 hours.  Urinalysis: No results for input(s): "COLORURINE", "LABSPEC", "PHURINE", "GLUCOSEU", "HGBUR", "BILIRUBINUR", "KETONESUR", "PROTEINUR", "UROBILINOGEN", "NITRITE", "LEUKOCYTESUR" in the last 72 hours.  Invalid  input(s): "APPERANCEUR"    Imaging: DG Chest Port 1 View Result Date: 05/08/2023 CLINICAL DATA:  Status post right thoracentesis. EXAM: PORTABLE CHEST 1 VIEW COMPARISON:  05/07/2023  FINDINGS: Improved aeration at the right lung base compatible with recent thoracentesis. Negative for a pneumothorax. Markedly increased densities at the left lung base compared to 05/07/2023 raises concern for re-accumulation of left pleural fluid although cannot exclude airspace disease or volume loss in this area. Heart size is within normal limits and stable. Right arm PICC line tip is in the SVC region. IMPRESSION: 1. Improved aeration at the right lung base compatible with recent thoracentesis. Negative for a pneumothorax. 2. Increased densities at the left lung base. Findings likely represent re-accumulation of pleural fluid and atelectasis at the left lung base. Electronically Signed   By: Richarda Overlie M.D.   On: 05/08/2023 13:21   US THORACENTESIS ASP PLEURAL SPACE W/IMG GUIDE Result Date: 05/08/2023 INDICATION: Patient with congestive heart failure, with a right-sided pleural effusion. Request for therapeutic thoracentesis. EXAM: ULTRASOUND GUIDED THERAPEUTIC THORACENTESIS MEDICATIONS: 8 ml of 1% lidocaine COMPLICATIONS: None immediate. PROCEDURE: An ultrasound guided thoracentesis was thoroughly discussed with the patient and questions answered. The benefits, risks, alternatives and complications were also discussed. The patient understands and wishes to proceed with the procedure. Written consent was obtained. Ultrasound was performed to localize and mark an adequate pocket of fluid in the right chest. The area was then prepped and draped in the normal sterile fashion. 1% Lidocaine was used for local anesthesia. Under ultrasound guidance a 6 Fr Safe-T-Centesis catheter was introduced. Thoracentesis was performed. The catheter was removed and a dressing applied. FINDINGS: A total of approximately 550 cc of dark amber pleural fluid was removed. IMPRESSION: Successful ultrasound guided right thoracentesis yielding 550 cc of pleural fluid. Performed by Buzzy Han, PA-C Electronically Signed   By: Richarda Overlie M.D.   On: 05/08/2023 13:15     Medications:    DAPTOmycin 600 mg (05/09/23 1737)    alum & mag hydroxide-simeth  15 mL Oral Once   apixaban  2.5 mg Oral BID   aspirin EC  81 mg Oral Daily   atorvastatin  40 mg Oral Daily   carvedilol  12.5 mg Oral BID WC   Chlorhexidine Gluconate Cloth  6 each Topical Daily   ciprofloxacin  500 mg Oral Q breakfast   clopidogrel  75 mg Oral Daily   ezetimibe  10 mg Oral Daily   feeding supplement  237 mL Oral BID BM   fentaNYL  1 patch Transdermal Q72H   pregabalin  75 mg Oral Daily   sodium chloride flush  10-40 mL Intracatheter Q12H   traZODone  50 mg Oral QHS   acetaminophen, albuterol, benzonatate, oxyCODONE, phenylephrine, sodium chloride flush, witch hazel-glycerin  Assessment/ Plan:  Ms. Katrisha Amuso is a 77 y.o.  female with medical problems of hypertension, heart failure with preserved ejection fraction, history of pleural effusions, history of diffuse vascular disease including coronary artery disease-history of PCI in LAD stent October 2023, chronic kidney disease, prediabetes, atrial fibrillation status post cardioversion in 2022 followed by Dr. Alyson Locket at [facility] nephrology. Patient presents to the ED with hypotension and suspected wound infection. She has been admitted for Cellulitis of right groin [L03.314] Cellulitis of right thigh [L03.115]   Acute Kidney Injury on chronic kidney disease stage IIIb with baseline creatinine 1.3 and GFR of 42 on 03/22/23.  Acute kidney injury appears multifactorial, hypotension, IV contrast exposure, and diuretics. Creatinine on  admission, 1.17.  Patient experienced hypertension with subsequent hypotension.  IV contrast exposure on 04/22/2023.  Received approximately 2 days of diuresis.    Creatinine continues to improve, patient states she is having appropriate urine output. Will need to follow up in our office at discharge.   Lab Results  Component Value Date   CREATININE 2.72 (H) 05/10/2023    CREATININE 3.24 (H) 05/09/2023   CREATININE 4.08 (H) 05/08/2023    Intake/Output Summary (Last 24 hours) at 05/10/2023 1151 Last data filed at 05/09/2023 2000 Gross per 24 hour  Intake --  Output 600 ml  Net -600 ml   2. Anemia of chronic kidney disease Lab Results  Component Value Date   HGB 7.8 (L) 05/09/2023    No updated labs drawn today.   3.  Hypertension, home regimen includes carvedilol, isosorbide, and valsartan.  Currently receiving carvedilol only. Blood pressure 157/63   LOS: 18 Osmar Howton 12/19/202411:51 AM

## 2023-05-11 ENCOUNTER — Inpatient Hospital Stay: Payer: Medicare Other

## 2023-05-11 ENCOUNTER — Other Ambulatory Visit: Payer: Self-pay | Admitting: *Deleted

## 2023-05-11 DIAGNOSIS — L03314 Cellulitis of groin: Secondary | ICD-10-CM | POA: Diagnosis not present

## 2023-05-11 DIAGNOSIS — B9622 Other specified Shiga toxin-producing Escherichia coli [E. coli] (STEC) as the cause of diseases classified elsewhere: Secondary | ICD-10-CM | POA: Diagnosis not present

## 2023-05-11 DIAGNOSIS — L03115 Cellulitis of right lower limb: Secondary | ICD-10-CM

## 2023-05-11 DIAGNOSIS — L089 Local infection of the skin and subcutaneous tissue, unspecified: Secondary | ICD-10-CM | POA: Diagnosis not present

## 2023-05-11 DIAGNOSIS — T8131XA Disruption of external operation (surgical) wound, not elsewhere classified, initial encounter: Secondary | ICD-10-CM | POA: Diagnosis not present

## 2023-05-11 LAB — CREATININE, SERUM
Creatinine, Ser: 2.45 mg/dL — ABNORMAL HIGH (ref 0.44–1.00)
GFR, Estimated: 20 mL/min — ABNORMAL LOW (ref >60)

## 2023-05-11 MED ORDER — LIDOCAINE HCL (PF) 1 % IJ SOLN
10.0000 mL | Freq: Once | INTRAMUSCULAR | Status: AC
  Administered 2023-05-11: 10 mL via INTRADERMAL
  Filled 2023-05-11: qty 10

## 2023-05-11 NOTE — Procedures (Signed)
PROCEDURE SUMMARY:  Successful image-guided left thoracentesis. Yielded 1.4 liters of dark red pleural fluid. Patient tolerated procedure well. EBL: Zero No immediate complications.  Specimen was not sent for labs. Post procedure CXR pending.  Please see imaging section of Epic for full dictation.  Sable Feil PA-C 05/11/2023 2:47 PM

## 2023-05-11 NOTE — Plan of Care (Signed)
  Problem: Education: Goal: Knowledge of General Education information will improve Description: Including pain rating scale, medication(s)/side effects and non-pharmacologic comfort measures Outcome: Progressing   Problem: Clinical Measurements: Goal: Ability to maintain clinical measurements within normal limits will improve Outcome: Progressing Goal: Will remain free from infection Outcome: Progressing Goal: Diagnostic test results will improve Outcome: Progressing Goal: Respiratory complications will improve Outcome: Progressing Goal: Cardiovascular complication will be avoided Outcome: Progressing   Problem: Activity: Goal: Risk for activity intolerance will decrease Outcome: Progressing   Problem: Nutrition: Goal: Adequate nutrition will be maintained Outcome: Progressing   Problem: Coping: Goal: Level of anxiety will decrease Outcome: Progressing   Problem: Elimination: Goal: Will not experience complications related to bowel motility Outcome: Progressing Goal: Will not experience complications related to urinary retention Outcome: Progressing   Problem: Pain Management: Goal: General experience of comfort will improve Outcome: Progressing   Problem: Safety: Goal: Ability to remain free from injury will improve Outcome: Progressing   Problem: Skin Integrity: Goal: Risk for impaired skin integrity will decrease Outcome: Progressing

## 2023-05-11 NOTE — Progress Notes (Signed)
Progress Note   Patient: Dawn Bruce ZOX:[id number] DOB: 04/30/1946 DOA: 04/22/2023     19 DOS: the patient was seen and examined on 05/11/2023   Brief hospital course:  Kloei Schwander is a 77 y.o. female with medical history significant of dCHF, CAD (s/p of recent DES), A fib on Eliquis, HTN, HLD, CDK-3a, chronic pain syndrome, multiple skin cancer, perforated appendicitis, former smoker, who presents with right groin pain.  Patient was recently hospitalized from 11/21 - 11/29 due to non-STEMI. Patient is s/p LHC with DES placement. Pt developed retroperitoneal hematoma & hemorrhagic shock due to complication of Iliac artery laceration during left heart cath of right groin. Pt underwent surgical repair of external iliac artery by VVS on 11/23.  Presented back to ED 12/1 due to increased redness, swelling, and pain at incision site of the cath. Failed outpatient Abx x1 dose.  Hypotensive to 80/50 on presentation. 12/2: pulmonology, and IR consulted to address pleural effusion. ID consulted to address abx course. Transitioned to linezolid. Vascular evaluated and recommended debridement. 12/3: Thoracentesis removed 1.6L left side 12/4:stable ORA, afebrile. Wound clinically greatly improved. Debridement today. Plan to stop heparin gtt and return to eliquis + DAPT s/p procedure 12/6: additional debridement.  Debridement again 12/10. Now on zosyn. Doing well. Respiratory status stable ORA.  12/12.  Patient had a worsening renal function, nephrology consult obtained. 12/14.  Worsening thrombocytopenia, consult hematology oncology to rule out TTS. 12/15.  Thoracentesis removed 1.5 Lon left 12/18 . Improved throbocytopenia 12/20  For thoracentesis today     Assessment and Plan:   Acute renal failure on chronic kidney disease stage IIIa. Hypokalemia. Mild metabolic acidosis. Transient hypotension. Renal function continues to show some improvement Serum creatinine peaked at 4.67 and continues to  show a downward trend Blood pressure is stable Appreciate nephrology input.       Sepsis due to cellulitis of right groin Right groin wound infection secondary to surgical repair of pseudoaneurysm. Sepsis criteria met on admission with WBC 21.3, heart rate 104, RR 29.  Lactic acid normal 1.7.   Sepsis appears to be secondary to right groin surgical wound infection.  Patient had debridement x 2, wound-vac in place Wound culture grow E. coli, Morganella morganii, Proteus Morales, Enterococcus faecalis.  Blood culture negative.  Initially treated with linezolid + cefepime, switched to Zosyn on 12/11.  Dressing change performed by vascular surgery 12/12, wound is getting better with significant granulation. Discussed with vascular surgery and ID, will continue IV antibiotics while in the hospital.   Due to severe thrombocytopenia, most likely caused by Zosyn, antibiotic switched to Cipro and daptomycin per ID.(Continue antibiotic therapy through 05/20/2023) Patient had a dressing changes performed 12/19, wound healing well. 12/19.  Discussed with ID about discharge plans and patient switched to Augmentin and ciprofloxacin to complete on 12/29.  Will need to monitor platelet count over 48 hours and if stable will discharge home on oral antibiotics. Will need to start back daptomycin if thrombocytopenia reoccurs. Will need home health PT/OT and RN on discharge Home health RN for wound VAC dressing changes on Mondays and Thursdays     Hypervolemia  Bilateral Pleural effusions Acute on chronic systolic congestive heart failure.  Hx of CAD: s/p recent DES. Last known LVEF of 25 to 30% from a 2D echocardiogram which was done 11/24 Patient initially received IV Lasix, also had a thoracentesis. Volume status has improved. Discussed with cardiology, will continue Plavix, not able to discontinue due to recent drug-eluting stent. Repeated chest  x-ray on 12/15 showed moderate pleural effusion.   Pulmonology is following. Thoracentesis removed 1.5 L of fluids on 12/16 on left,   For repeat thoracentesis on 12/20    Peritoneal hematoma Anemia acute on chronic. Thrombocytopenia. Hemoglobin dropped down to 7.6 12/11, received another unit of PRBC transfusion.   Normal iron, B12 level borderline, check homocystine level normal.  Hemoglobin much better. Patient developed mild thrombocytopenia of 117 on 12/12, not currently on heparin.   Noted to have worsening thrombocytopenia with the lowest platelet count of 39. Patient was seen by hematology interval cytopenia attributed to Zosyn.  No evidence of TTS.  No active bleeding.  Zosyn discontinued. Platelet count has normalized     Choking episode. Patient had a choking episode the night of 12/16, appears to have improved  Patient does not have a baseline dysphagia.  Continue to follow.     Essential hypertension: Continue carvedilol  Hold ARB due to acute renal failure.     Atrial fibrillation, chronic (HCC): rate controlled On eliquis Increased Coreg to 12.5 mg which is home dose.   Diarrhea: C. difficile negative. Diarrhea improved after giving Imodium.   Chronic pain syndrome -Continue home fentanyl patch and Lyrica   HLD: -lipitor and zetia          Subjective: Had a rough night per patient.  Sleeping but arousable.  Husband at the bedside  Physical Exam: Vitals:   05/11/23 0331 05/11/23 0400 05/11/23 0427 05/11/23 0911  BP: (!) 165/94 (!) 149/88  (!) 146/86  Pulse: 99   98  Resp: 16   16  Temp: 98.1 F (36.7 C)   97.7 F (36.5 C)  TempSrc: Oral   Oral  SpO2: 95%   100%  Weight:   66.3 kg   Height:       General exam: Appears calm and comfortable  Respiratory system: Decreased breath sounds on left, scattered crackles. Respiratory effort normal. Cardiovascular system: Irregularly irregular. No JVD, murmurs, rubs, gallops or clicks. No pedal edema. Gastrointestinal system: Abdomen is nondistended,  soft and nontender. No organomegaly or masses felt. Normal bowel sounds heard. Central nervous system: Alert and oriented. No focal neurological deficits. Extremities: Symmetric 5 x 5 power. Skin: No rashes, lesions or ulcers, wound VAC in place in right groin Psychiatry: Judgement and insight appear normal. Mood & affect appropriate.    Data Reviewed: Labs reviewed.  Creatinine 2.45, hemoglobin 8.2, platelet count 135 There are no new results to review at this time.  Family Communication: Plan of care discussed with patient and her husband at the bedside.  Disposition: Status is: Inpatient Remains inpatient appropriate because: Discharge planning  Planned Discharge Destination: Home with Home Health    Time spent: 35 minutes  Author: Lucile Shutters, MD 05/11/2023 1:32 PM  For on call review [email or web address].

## 2023-05-11 NOTE — Care Management Important Message (Signed)
Important Message  Patient Details  Name: Dawn Bruce MRN: [id number] Date of Birth: 08-Mar-1946   Important Message Given:  Yes - Medicare IM     Bernadette Hoit 05/11/2023, 10:11 AM

## 2023-05-11 NOTE — Progress Notes (Signed)
[facility]  ROUNDING NOTE   Subjective:   Dawn Bruce  is a 77 y.o. female with medical problems of hypertension, heart failure with preserved ejection fraction, history of pleural effusions, history of diffuse vascular disease including coronary artery disease-history of PCI in LAD stent October 2023, chronic kidney disease, prediabetes, atrial fibrillation status post cardioversion in 2022 followed by Dr. Alyson Locket at [facility] nephrology. Patient presents to the ED with hypotension and suspected wound infection. She has been admitted for Cellulitis of right groin [L03.314] Cellulitis of right thigh [L03.115]  Patient is known to our practice from previous admission.   Patient seen resting in bed, husband at bedside Appetite has improved greatly Remains on room air Ambulating in room with assistance  Creatinine 2.45   Objective:  Vital signs in last 24 hours:  Temp:  [97.7 F (36.5 C)-98.6 F (37 C)] 97.7 F (36.5 C) (12/20 0911) Pulse Rate:  [92-99] 98 (12/20 0911) Resp:  [12-16] 16 (12/20 0911) BP: (146-176)/(76-94) 146/86 (12/20 0911) SpO2:  [95 %-100 %] 100 % (12/20 0911) Weight:  [66.3 kg] 66.3 kg (12/20 0427)  Weight change: -3.3 kg Filed Weights   05/09/23 0500 05/10/23 0500 05/11/23 0427  Weight: 67.2 kg 69.6 kg 66.3 kg    Intake/Output: I/O last 3 completed shifts: In: -  Out: 1250 [Urine:800; Drains:450]   Intake/Output this shift:  No intake/output data recorded.  Physical Exam: General: NAD  Head: Normocephalic, atraumatic. Moist oral mucosal membranes  Eyes: Anicteric  Lungs:  Clear to auscultation.normal effort  Heart: Regular rate and rhythm  Abdomen:  Soft, nontender  Extremities:  No peripheral edema.  Neurologic: Alert and oriented, moving all four extremities  Skin: No lesions       Basic Metabolic Panel: Recent Labs  Lab 05/05/23 0525 05/06/23 0509 05/07/23 0540 05/08/23 0338 05/09/23 0530 05/10/23 0645 05/11/23 0350   NA 137 137 138 140 141  --   --   K 4.6 4.6 4.4 4.3 4.4  --   --   CL 106 105 105 108 106  --   --   CO2 21* 22 25 24 25   --   --   GLUCOSE 106* 105* 107* 118* 104*  --   --   BUN 91* 92* 87* 83* 69*  --   --   CREATININE 4.67* 4.67* 4.46* 4.08* 3.24* 2.72* 2.45*  CALCIUM 8.3* 8.3* 8.5* 8.4* 8.4*  --   --     Liver Function Tests: No results for input(s): "AST", "ALT", "ALKPHOS", "BILITOT", "PROT", "ALBUMIN" in the last 168 hours.  No results for input(s): "LIPASE", "AMYLASE" in the last 168 hours. No results for input(s): "AMMONIA" in the last 168 hours.  CBC: Recent Labs  Lab 05/06/23 1338 05/07/23 0540 05/08/23 0338 05/09/23 0530 05/10/23 1347  WBC 7.2 6.3 7.1 7.0 6.8  NEUTROABS 5.4  --   --   --   --   HGB 9.8* 9.0* 8.3* 7.8* 8.2*  HCT 29.8* 27.6* 25.2* 24.1* 25.9*  MCV 87.9 87.1 87.2 88.9 91.8  PLT 50* 39* 43* 69* 135*    Cardiac Enzymes: Recent Labs  Lab 05/07/23 1822  CKTOTAL 39    BNP: Invalid input(s): "POCBNP"  CBG: No results for input(s): "GLUCAP" in the last 168 hours.   Microbiology: Results for orders placed or performed during the hospital encounter of 04/22/23  Blood Culture (routine x 2)     Status: None   Collection Time: 04/22/23  3:28 PM  Specimen: BLOOD  Result Value Ref Range Status   Specimen Description BLOOD RIGHT ANTECUBITAL  Final   Special Requests   Final    BOTTLES DRAWN AEROBIC AND ANAEROBIC Blood Culture results may not be optimal due to an excessive volume of blood received in culture bottles   Culture   Final    NO GROWTH 5 DAYS Performed at [facility], [address], [place], [place] [address]    Report Status 04/27/2023 FINAL  Final  C Difficile Quick Screen w PCR reflex     Status: None   Collection Time: 04/22/23  4:19 PM   Specimen: STOOL  Result Value Ref Range Status   C Diff antigen NEGATIVE NEGATIVE Final   C Diff toxin NEGATIVE NEGATIVE Final   C Diff interpretation No C. difficile detected.   Final    Comment: Performed at [facility], [address] [place], [place] [address]  Gastrointestinal Panel by PCR , Stool     Status: None   Collection Time: 04/22/23  4:25 PM   Specimen: Stool  Result Value Ref Range Status   Campylobacter species NOT DETECTED NOT DETECTED Final   Plesimonas shigelloides NOT DETECTED NOT DETECTED Final   Salmonella species NOT DETECTED NOT DETECTED Final   Yersinia enterocolitica NOT DETECTED NOT DETECTED Final   Vibrio species NOT DETECTED NOT DETECTED Final   Vibrio cholerae NOT DETECTED NOT DETECTED Final   Enteroaggregative E coli (EAEC) NOT DETECTED NOT DETECTED Final   Enteropathogenic E coli (EPEC) NOT DETECTED NOT DETECTED Final   Enterotoxigenic E coli (ETEC) NOT DETECTED NOT DETECTED Final   Shiga like toxin producing E coli (STEC) NOT DETECTED NOT DETECTED Final   Shigella/Enteroinvasive E coli (EIEC) NOT DETECTED NOT DETECTED Final   Cryptosporidium NOT DETECTED NOT DETECTED Final   Cyclospora cayetanensis NOT DETECTED NOT DETECTED Final   Entamoeba histolytica NOT DETECTED NOT DETECTED Final   Giardia lamblia NOT DETECTED NOT DETECTED Final   Adenovirus F40/41 NOT DETECTED NOT DETECTED Final   Astrovirus NOT DETECTED NOT DETECTED Final   Norovirus GI/GII NOT DETECTED NOT DETECTED Final   Rotavirus A NOT DETECTED NOT DETECTED Final   Sapovirus (I, II, IV, and V) NOT DETECTED NOT DETECTED Final    Comment: Performed at [facility], [address] [place], [place] [address]  Blood Culture (routine x 2)     Status: None   Collection Time: 04/23/23  6:31 AM   Specimen: BLOOD  Result Value Ref Range Status   Specimen Description BLOOD BLOOD RIGHT HAND  Final   Special Requests   Final    BOTTLES DRAWN AEROBIC AND ANAEROBIC Blood Culture results may not be optimal due to an inadequate volume of blood received in culture bottles   Culture   Final    NO GROWTH 5 DAYS Performed at [facility], [address] [place], [place] [address]    Report Status 04/28/2023 FINAL  Final  Aerobic Culture w Gram Stain (superficial specimen)     Status: None   Collection Time: 04/23/23 10:42 AM   Specimen: Groin  Result Value Ref Range Status   Specimen Description   Final    GROIN Performed at [facility], [address] [place], [place] [address]    Special Requests   Final    NONE Performed at [facility], [address], [place], [place] [address]    Gram Stain   Final    RARE  WBC PRESENT, PREDOMINANTLY PMN RARE GRAM NEGATIVE RODS RARE GRAM NEGATIVE COCCOBACILLI Performed at [facility], [address] [place], [place] [address]    Culture   Final    MODERATE MORGANELLA MORGANII FEW ESCHERICHIA COLI FEW PROTEUS MIRABILIS    Report Status 04/26/2023 FINAL  Final   Organism ID, Bacteria ESCHERICHIA COLI  Final   Organism ID, Bacteria MORGANELLA MORGANII  Final   Organism ID, Bacteria PROTEUS MIRABILIS  Final      Susceptibility   Escherichia coli - MIC*    AMPICILLIN >=32 RESISTANT Resistant     CEFEPIME <=0.12 SENSITIVE Sensitive     CEFTAZIDIME <=1 SENSITIVE Sensitive     CEFTRIAXONE <=0.25 SENSITIVE Sensitive     CIPROFLOXACIN <=0.25 SENSITIVE Sensitive     GENTAMICIN <=1 SENSITIVE Sensitive     IMIPENEM <=0.25 SENSITIVE Sensitive     TRIMETH/SULFA <=20 SENSITIVE Sensitive     AMPICILLIN/SULBACTAM 16 INTERMEDIATE Intermediate     PIP/TAZO <=4 SENSITIVE Sensitive ug/mL    * FEW ESCHERICHIA COLI   Morganella morganii - MIC*    AMPICILLIN >=32 RESISTANT Resistant     CEFTAZIDIME <=1 SENSITIVE Sensitive     CIPROFLOXACIN <=0.25 SENSITIVE Sensitive     GENTAMICIN <=1 SENSITIVE Sensitive     IMIPENEM 4 SENSITIVE Sensitive     TRIMETH/SULFA <=20 SENSITIVE Sensitive     AMPICILLIN/SULBACTAM 16 INTERMEDIATE Intermediate     PIP/TAZO <=4 SENSITIVE Sensitive ug/mL    * MODERATE MORGANELLA MORGANII   Proteus mirabilis - MIC*    AMPICILLIN <=2 SENSITIVE  Sensitive     CEFEPIME <=0.12 SENSITIVE Sensitive     CEFTAZIDIME <=1 SENSITIVE Sensitive     CEFTRIAXONE <=0.25 SENSITIVE Sensitive     CIPROFLOXACIN <=0.25 SENSITIVE Sensitive     GENTAMICIN <=1 SENSITIVE Sensitive     IMIPENEM 2 SENSITIVE Sensitive     TRIMETH/SULFA <=20 SENSITIVE Sensitive     AMPICILLIN/SULBACTAM <=2 SENSITIVE Sensitive     PIP/TAZO <=4 SENSITIVE Sensitive ug/mL    * FEW PROTEUS MIRABILIS  Aerobic/Anaerobic Culture w Gram Stain (surgical/deep wound)     Status: None   Collection Time: 04/25/23  2:41 PM   Specimen: Wound; Body Fluid  Result Value Ref Range Status   Specimen Description   Final    WOUND Performed at [facility], [address] [place], [place] [address]    Special Requests RETROPERITONEAL HEMATOMA SPEC A  Final   Gram Stain   Final    FEW WBC PRESENT,BOTH PMN AND MONONUCLEAR NO ORGANISMS SEEN    Culture   Final    RARE ENTEROCOCCUS FAECALIS NO ANAEROBES ISOLATED Performed at [facility], [address] [place], [place] [address]    Report Status 05/02/2023 FINAL  Final   Organism ID, Bacteria ENTEROCOCCUS FAECALIS  Final      Susceptibility   Enterococcus faecalis - MIC*    AMPICILLIN <=2 SENSITIVE Sensitive     VANCOMYCIN 2 SENSITIVE Sensitive     GENTAMICIN SYNERGY SENSITIVE Sensitive     * RARE ENTEROCOCCUS FAECALIS    Coagulation Studies: No results for input(s): "LABPROT", "INR" in the last 72 hours.  Urinalysis: No results for input(s): "COLORURINE", "LABSPEC", "PHURINE", "GLUCOSEU", "HGBUR", "BILIRUBINUR", "KETONESUR", "PROTEINUR", "UROBILINOGEN", "NITRITE", "LEUKOCYTESUR" in the last 72 hours.  Invalid input(s): "APPERANCEUR"    Imaging: DG Chest Port 1 View Result Date: 05/11/2023 CLINICAL DATA:  Follow-up left pleural effusion. EXAM: PORTABLE CHEST 1 VIEW COMPARISON:  05/08/2023 FINDINGS: There is a  right arm PICC line with tip in the projection of the superior cavoatrial junction. Cardiomediastinal  contours are unchanged. New small right pleural effusion with overlying atelectasis. Interval increase opacification within the left mid and left lower lung which may reflect progressive pleural effusion, atelectasis, and or airspace disease. IMPRESSION: 1. New small right pleural effusion with overlying atelectasis. 2. Interval increase opacification within the left mid and left lower lung which may reflect progressive pleural effusion, atelectasis, and/or airspace disease. Electronically Signed   By: Signa Kell M.D.   On: 05/11/2023 11:25     Medications:      alum & mag hydroxide-simeth  15 mL Oral Once   amoxicillin-clavulanate  1 tablet Oral BID   apixaban  2.5 mg Oral BID   aspirin EC  81 mg Oral Daily   atorvastatin  40 mg Oral Daily   carvedilol  12.5 mg Oral BID WC   Chlorhexidine Gluconate Cloth  6 each Topical Daily   ciprofloxacin  500 mg Oral Q breakfast   clopidogrel  75 mg Oral Daily   ezetimibe  10 mg Oral Daily   feeding supplement  237 mL Oral BID BM   fentaNYL  1 patch Transdermal Q72H   pregabalin  75 mg Oral Daily   sodium chloride flush  10-40 mL Intracatheter Q12H   traZODone  50 mg Oral QHS   acetaminophen, albuterol, benzonatate, oxyCODONE, phenylephrine, sodium chloride flush, witch hazel-glycerin  Assessment/ Plan:  Ms. Dawn Bruce is a 77 y.o.  female with medical problems of hypertension, heart failure with preserved ejection fraction, history of pleural effusions, history of diffuse vascular disease including coronary artery disease-history of PCI in LAD stent October 2023, chronic kidney disease, prediabetes, atrial fibrillation status post cardioversion in 2022 followed by Dr. Alyson Locket at [facility] nephrology. Patient presents to the ED with hypotension and suspected wound infection. She has been admitted for Cellulitis of right groin [L03.314] Cellulitis of right thigh [L03.115]   Acute Kidney Injury on chronic kidney disease stage IIIb with baseline  creatinine 1.3 and GFR of 42 on 03/22/23.  Acute kidney injury appears multifactorial, hypotension, IV contrast exposure, and diuretics. Creatinine on admission, 1.17.  Patient experienced hypertension with subsequent hypotension.  IV contrast exposure on 04/22/2023.  Received approximately 2 days of diuresis.    Renal function improving.  We feel patient is stable for discharge today.  Will schedule outpatient follow-up in our office in 1 to 2 weeks.  Lab Results  Component Value Date   CREATININE 2.45 (H) 05/11/2023   CREATININE 2.72 (H) 05/10/2023   CREATININE 3.24 (H) 05/09/2023    Intake/Output Summary (Last 24 hours) at 05/11/2023 1332 Last data filed at 05/10/2023 2145 Gross per 24 hour  Intake --  Output 650 ml  Net -650 ml   2. Anemia of chronic kidney disease Lab Results  Component Value Date   HGB 8.2 (L) 05/10/2023    Hemoglobin decreased but acceptable.  3.  Hypertension, home regimen includes carvedilol, isosorbide, and valsartan.  Currently receiving carvedilol only. Blood pressure stable.   LOS: 19 Etter Royall 12/20/20241:32 PM

## 2023-05-11 NOTE — Progress Notes (Signed)
PT Cancellation Note  Patient Details Name: Dawn Bruce MRN: [id number] DOB: 02/22/1946   Cancelled Treatment:    Reason Eval/Treat Not Completed: Patient at procedure or test/unavailable. Pt's chart reviewed prior to entry. Pt found ambulating unit with mobiltiy specialist. PT and mobility planning for hand off however transport arriving for pt's thoracentesis. PT to re-attempt another time/date.   Delphia Grates. Fairly IV, PT, DPT Physical Therapist- [place]  [facility]  05/11/2023, 3:22 PM

## 2023-05-11 NOTE — TOC Progression Note (Addendum)
Transition of Care [facility]) - Progression Note    Patient Details  Name: Dawn Bruce MRN: [id number] Date of Birth: 1945-11-26  Transition of [facility] CM/SW Contact  Chapman Fitch, RN Phone Number: 05/11/2023, 9:52 AM  Clinical Narrative:        Per MD anticipated dc on Sunday.  Cory with [facility] notified, and updated that the plan would be for the wound vac to be changed on Monday and Thursday.   Her Home Vac supplies are being stored in the dictation area on 2c, MD and weekend [facility] aware    Updated husband at bedside. Patient was asleep   Expected Discharge Plan and Services                                               Social Determinants of Health (SDOH) Interventions SDOH Screenings   Food Insecurity: No Food Insecurity (04/23/2023)  Housing: Low Risk  (05/03/2023)  Transportation Needs: No Transportation Needs (04/23/2023)  Utilities: Not At Risk (04/23/2023)  Alcohol Screen: Low Risk  (12/08/2022)  Depression (PHQ2-9): Low Risk  (12/08/2022)  Financial Resource Strain: Low Risk  (10/13/2022)  Physical Activity: Insufficiently Active (10/13/2022)  Social Connections: Unknown (10/13/2022)  Stress: No Stress Concern Present (10/13/2022)  Tobacco Use: Medium Risk (04/27/2023)    Readmission Risk Interventions     No data to display

## 2023-05-11 NOTE — Consult Note (Signed)
[facility] Liaison Note  05/11/2023  Dawn Bruce 08-24-1945 [id number]  Location: RN Hospital Liaison screened the patient remotely at [facility].  Insurance: [facility] Medicare Advantage   [name] is a 77 y.o. female who is a Primary Care Patient of Bacigalupo, Marzella Schlein, MD- [facility]. with [facility]. The patient was screened for 7 and 30 day readmission hospitalization with noted high risk score for unplanned readmission risk with 2 IP in 6 months.  The patient was assessed for potential Care Management service needs for post hospital transition for care coordination. Review of patient's electronic medical record reveals patient was admitted for Cellulitis right groin. Pt will be discharged home with [facility] with [facility] and [facility] for ongoing fusion therapy. Liaison attempted outreach call to introduce VBCI services however unsuccessful. Based upon pt's co-morbidities, risk status and current diagnosis liaison will make a referral for post hospital prevention readmission.   Plan: [facility] Liaison will continue to follow progress and disposition to asess for post hospital community care coordination/management needs.  Referral request for community care coordination: Will make a referral for nurse care coordinator to intervene with care management services to prevent readmissions.   VBCI Care Management/Population Health does not replace or interfere with any arrangements made by the Inpatient Transition of Care team.   For questions contact:   Elliot Cousin, RN, [facility] Liaison [place]   [facility], Population Health Office Hours MTWF  8:00 am-6:00 pm Direct Dial: [phone] mobile [phone] [Office toll free line] Office Hours are M-F 8:30 - 5 pm [email or web address]

## 2023-05-11 NOTE — Progress Notes (Signed)
Date of Admission:  04/22/2023     ID: Dawn Bruce is a 77 y.o. female  Principal Problem:   Cellulitis of right groin Active Problems:   Chronic pain syndrome   Essential hypertension   HLD (hyperlipidemia)   Chronic diastolic heart failure (HCC)   Sepsis (HCC)   CAD (coronary artery disease)   Chronic kidney disease, stage 3a (HCC)   Peritoneal hematoma   Pleural effusion   Abnormal LFTs   Hypokalemia   Atrial fibrillation, chronic (HCC)   Diarrhea   HFrEF (heart failure with reduced ejection fraction) (HCC)   Severe sepsis (HCC)   Infected wound   Cellulitis of right thigh   Acute kidney injury superimposed on stage 3a chronic kidney disease (HCC)   Metabolic acidosis   Overweight (BMI 25.0-29.9)   Thrombocytopenia (HCC)   S/P thoracentesis   Antibiotic 12/2-12/9- cefepime + linezolid 12/9= 12/16 zosyn 12/16 cipro  Daptomycin  Dawn Bruce is a 77 y.o. female with a history of CAD, HTN, LD was recently in [facility] between 11/21-11/29 for chest pain and found to have STEMI, underwent angio on 11/22 and ostial LADstent placement- complicated by retroperitoneal hematoma, hemorrhagic shock needing  repair of rt external iliac and circumflex artery by Dr.Esco on 11/222/4. She also received PRBC and was in ICU- She was discharge don 04/20/23 and called vascular who prescribed keflex on 11/30. She came back to the ED on 12/1 with infected wound rt groin. Culture sent and was started on Iv vanco and ceftriaxone. On 12/15 IV zosyn changed to PO cipro and augmentin  Subjective: She is feeling better   Medications:   alum & mag hydroxide-simeth  15 mL Oral Once   amoxicillin-clavulanate  1 tablet Oral BID   apixaban  2.5 mg Oral BID   aspirin EC  81 mg Oral Daily   atorvastatin  40 mg Oral Daily   carvedilol  12.5 mg Oral BID WC   Chlorhexidine Gluconate Cloth  6 each Topical Daily   ciprofloxacin  500 mg Oral Q breakfast   clopidogrel  75 mg Oral Daily   ezetimibe  10 mg  Oral Daily   feeding supplement  237 mL Oral BID BM   fentaNYL  1 patch Transdermal Q72H   pregabalin  75 mg Oral Daily   sodium chloride flush  10-40 mL Intracatheter Q12H   traZODone  50 mg Oral QHS    Objective: Vital signs in last 24 hours: Patient Vitals for the past 24 hrs:  BP Temp Temp src Pulse Resp SpO2 Weight  05/11/23 1704 (!) 153/61 98.6 F (37 C) Oral 94 16 99 % --  05/11/23 1530 (!) 144/77 -- -- -- -- 95 % --  05/11/23 0911 (!) 146/86 97.7 F (36.5 C) Oral 98 16 100 % --  05/11/23 0427 -- -- -- -- -- -- 66.3 kg  05/11/23 0400 (!) 149/88 -- -- -- -- -- --  05/11/23 0331 (!) 165/94 98.1 F (36.7 C) Oral 99 16 95 % --  05/10/23 1949 (!) 166/76 98.6 F (37 C) Oral 94 12 97 % --  05/10/23 1858 (!) 162/76 -- -- 92 -- -- --      PHYSICAL EXAM:  General: awake and alert Chest b/l air entry Decreased let base [id number] Rt groin wound [place], healthy 05/10/23     12/12    Neurologic: Grossly non-focal  Lab Results    Latest Ref Rng & Units 05/10/2023    1:47 PM 05/09/2023  5:30 AM 05/08/2023    3:38 AM  CBC  WBC 4.0 - 10.5 K/uL 6.8  7.0  7.1   Hemoglobin 12.0 - 15.0 g/dL 8.2  7.8  8.3   Hematocrit 36.0 - 46.0 % 25.9  24.1  25.2   Platelets 150 - 400 K/uL 135  69  43        Latest Ref Rng & Units 05/11/2023    3:50 AM 05/10/2023    6:45 AM 05/09/2023    5:30 AM  CMP  Glucose 70 - 99 mg/dL   161   BUN 8 - 23 mg/dL   69   Creatinine 0.96 - 1.00 mg/dL 0.45  4.09  8.11   Sodium 135 - 145 mmol/L   141   Potassium 3.5 - 5.1 mmol/L   4.4   Chloride 98 - 111 mmol/L   106   CO2 22 - 32 mmol/L   25   Calcium 8.9 - 10.3 mg/dL   8.4       Microbiology: Wound culture E.coli, proteus morganella Surgical culture enterococcus fecalis     Assessment/Plan: Infected groin wound at the site of surgery with dehiscence of wound and surrounding cellulitis Culture polymicrobial infection ( 3 gram neg rods and enteococcus debridement done .has wound  vac On  zosyn day 12 of antibiotic which was DC on 12/15 due to thrombocytopenia Currently  CIPRO+ augmentin, but heme onc wants to avoid any betalactam due to thrombocytopenia  augmentin changed to  dapto- but augmentin less likely to cause thrombocytopenia when compared to zosyn Platelet has normalized- restarted augmentin and observe for 2 days  If platelet declines will have to switch back to daptomycin Antibiotics until 05/24/23 ( cipro and augmentin)   AKI on CKD  - seen by nephrologist- multifactorial etiology- IV contrast, diuretics - improving  Left pleural effusion- s/p multiple  thoracentesis Anemia    Hematoma both retroperitoneal and superficial at the site of cardiac cath    Recent hemorrhagic shock needing PRBc   Injury to external iliac artery on the right which was repaired in NOV 2024   CAD s/p stent ( restenosis of ostial LAD stent)? Afib   Discussed the management with patient and hospitalist ID will follow her peripherally this weekend- if she is discharged will see her as OP on 05/24/23

## 2023-05-12 DIAGNOSIS — L03314 Cellulitis of groin: Secondary | ICD-10-CM | POA: Diagnosis not present

## 2023-05-12 LAB — CBC WITH DIFFERENTIAL/PLATELET
Abs Immature Granulocytes: 0.03 10*3/uL (ref 0.00–0.07)
Basophils Absolute: 0 10*3/uL (ref 0.0–0.1)
Basophils Relative: 0 %
Eosinophils Absolute: 0.4 10*3/uL (ref 0.0–0.5)
Eosinophils Relative: 7 %
HCT: 23.7 % — ABNORMAL LOW (ref 36.0–46.0)
Hemoglobin: 7.6 g/dL — ABNORMAL LOW (ref 12.0–15.0)
Immature Granulocytes: 1 %
Lymphocytes Relative: 12 %
Lymphs Abs: 0.6 10*3/uL — ABNORMAL LOW (ref 0.7–4.0)
MCH: 28.9 pg (ref 26.0–34.0)
MCHC: 32.1 g/dL (ref 30.0–36.0)
MCV: 90.1 fL (ref 80.0–100.0)
Monocytes Absolute: 0.5 10*3/uL (ref 0.1–1.0)
Monocytes Relative: 10 %
Neutro Abs: 3.5 10*3/uL (ref 1.7–7.7)
Neutrophils Relative %: 70 %
Platelets: 175 10*3/uL (ref 150–400)
RBC: 2.63 MIL/uL — ABNORMAL LOW (ref 3.87–5.11)
RDW: 16.5 % — ABNORMAL HIGH (ref 11.5–15.5)
WBC: 5 10*3/uL (ref 4.0–10.5)
nRBC: 0 % (ref 0.0–0.2)

## 2023-05-12 LAB — COMPREHENSIVE METABOLIC PANEL
ALT: 9 U/L (ref 0–44)
AST: 17 U/L (ref 15–41)
Albumin: 2.2 g/dL — ABNORMAL LOW (ref 3.5–5.0)
Alkaline Phosphatase: 44 U/L (ref 38–126)
Anion gap: 7 (ref 5–15)
BUN: 48 mg/dL — ABNORMAL HIGH (ref 8–23)
CO2: 25 mmol/L (ref 22–32)
Calcium: 8.3 mg/dL — ABNORMAL LOW (ref 8.9–10.3)
Chloride: 112 mmol/L — ABNORMAL HIGH (ref 98–111)
Creatinine, Ser: 1.94 mg/dL — ABNORMAL HIGH (ref 0.44–1.00)
GFR, Estimated: 26 mL/min — ABNORMAL LOW (ref >60)
Glucose, Bld: 96 mg/dL (ref 70–99)
Potassium: 4.1 mmol/L (ref 3.5–5.1)
Sodium: 144 mmol/L (ref 135–145)
Total Bilirubin: 0.6 mg/dL (ref <1.2)
Total Protein: 4.8 g/dL — ABNORMAL LOW (ref 6.5–8.1)

## 2023-05-12 LAB — PREPARE RBC (CROSSMATCH)

## 2023-05-12 MED ORDER — SODIUM CHLORIDE 0.9% IV SOLUTION: Freq: Once | INTRAVENOUS | Status: AC

## 2023-05-12 NOTE — Plan of Care (Signed)

## 2023-05-12 NOTE — Progress Notes (Signed)
Progress Note   Patient: Dawn Bruce ZOX:[id number] DOB: [birth year] DOA: 04/22/2023     20 DOS: the patient was seen and examined on 05/12/2023   Brief hospital course:  Lierin Liska is a 77 y.o. female with medical history significant of dCHF, CAD (s/p of recent DES), A fib on Eliquis, HTN, HLD, CDK-3a, chronic pain syndrome, multiple skin cancer, perforated appendicitis, former smoker, who presents with right groin pain.  Patient was recently hospitalized from 11/21 - 11/29 due to non-STEMI. Patient is s/p LHC with DES placement. Pt developed retroperitoneal hematoma & hemorrhagic shock due to complication of Iliac artery laceration during left heart cath of right groin. Pt underwent surgical repair of external iliac artery by [facility] on 11/23.  Presented back to ED 12/1 due to increased redness, swelling, and pain at incision site of the cath. Failed outpatient Abx x1 dose.  Hypotensive to 80/50 on presentation. 12/2: pulmonology, and IR consulted to address pleural effusion. ID consulted to address abx course. Transitioned to linezolid. Vascular evaluated and recommended debridement. 12/3: Thoracentesis removed 1.6L left side 12/4:stable ORA, afebrile. Wound clinically greatly improved. Debridement today. Plan to stop heparin gtt and return to eliquis + DAPT s/p procedure 12/6: additional debridement.  Debridement again 12/10. Now on zosyn. Doing well. Respiratory status stable ORA.  12/12.  Patient had a worsening renal function, nephrology consult obtained. 12/14.  Worsening thrombocytopenia, consult hematology oncology to rule out TTS. 12/15.  Thoracentesis removed 1.5 Lon left 12/18 . Improved throbocytopenia 12/20  For thoracentesis today 12/21.  Status post thoracentesis    Assessment and Plan:    Acute renal failure on chronic kidney disease stage IIIa. Hypokalemia. Mild metabolic acidosis. Transient hypotension. Renal function continues to improve Serum creatinine peaked at  4.67 and continues to show a downward trend Blood pressure is stable Appreciate nephrology input.       Sepsis due to cellulitis of right groin Right groin wound infection secondary to surgical repair of pseudoaneurysm. Sepsis criteria met on admission with WBC 21.3, heart rate 104, RR 29.  Lactic acid normal 1.7.   Sepsis appears to be secondary to right groin surgical wound infection.  Patient had debridement x 2, wound-vac in place Wound culture grow E. coli, Morganella morganii, Proteus Morales, Enterococcus faecalis.  Blood culture negative.  Initially treated with linezolid + cefepime, switched to Zosyn on 12/11.  Dressing change performed by vascular surgery 12/12, wound is getting better with significant granulation. Discussed with vascular surgery and ID, will continue IV antibiotics while in the hospital.   Due to severe thrombocytopenia, most likely caused by Zosyn, antibiotic switched to Cipro and daptomycin per ID.(Continue antibiotic therapy through 05/20/2023) Patient had a dressing changes performed 12/19, wound healing well. 12/19.   Discussed with ID about discharge plans and patient switched to Augmentin and ciprofloxacin to complete on 05/24/23.  Will need to monitor platelet count over 48 hours and if stable will discharge home on oral antibiotics. Will need to start back daptomycin if thrombocytopenia reoccurs. Will need home health PT/OT and RN on discharge Home health RN for wound VAC dressing changes on Mondays and Thursdays Will DC in a.m.     Hypervolemia  Bilateral Pleural effusions Acute on chronic systolic congestive heart failure.  Hx of CAD: s/p recent DES. Last known LVEF of 25 to 30% from a 2D echocardiogram which was done 11/24 Patient initially received IV Lasix, also had a thoracentesis x 2 Volume status has improved. Discussed with cardiology, will continue Plavix, not  able to discontinue due to recent drug-eluting stent. Repeated chest x-ray on  12/15 showed moderate pleural effusion.  Pulmonology is following. Thoracentesis removed 1.5 L of fluids on 12/16 on left,        Peritoneal hematoma Anemia acute on chronic. Thrombocytopenia. Hemoglobin dropped down to 7.6 12/11, received another unit of PRBC transfusion.   Normal iron, B12 level borderline, check homocystine level normal.  Hemoglobin much better. Patient developed mild thrombocytopenia of 117 on 12/12, not currently on heparin.   Noted to have worsening thrombocytopenia with the lowest platelet count of 39. Patient was seen by hematology interval cytopenia attributed to Zosyn.  No evidence of TTS.  No active bleeding.  Zosyn discontinued. Platelet count has normalized     Choking episode. Patient had a choking episode the night of 12/16, appears to have improved  Patient does not have a baseline dysphagia.  Continue to follow.     Essential hypertension: Continue carvedilol  Hold ARB due to acute renal failure.     Atrial fibrillation, chronic (HCC): rate controlled On eliquis Increased Coreg to 12.5 mg which is home dose.   Diarrhea: C. difficile negative. Diarrhea improved after giving Imodium.   Chronic pain syndrome -Continue home fentanyl patch and Lyrica   HLD: -lipitor and zetia            Subjective: No new complaints.  Ready for discharge  Physical Exam: Vitals:   05/11/23 2126 05/12/23 0343 05/12/23 0416 05/12/23 0814  BP: (!) 145/51 (!) 132/57  133/72  Pulse: 91 83  92  Resp: 20     Temp: 99.1 F (37.3 C) 98.1 F (36.7 C)  98.6 F (37 C)  TempSrc: Oral Oral  Oral  SpO2: 97% 97%  97%  Weight:   67.2 kg   Height:       General exam: Appears calm and comfortable  Respiratory system: Decreased breath sounds on left, scattered crackles. Respiratory effort normal. Cardiovascular system: Irregularly irregular. No JVD, murmurs, rubs, gallops or clicks. No pedal edema. Gastrointestinal system: Abdomen is nondistended, soft and  nontender. No organomegaly or masses felt. Normal bowel sounds heard. Central nervous system: Alert and oriented. No focal neurological deficits. Extremities: Symmetric 5 x 5 power. Skin: No rashes, lesions or ulcers, wound VAC in place in right groin Psychiatry: Judgement and insight appear normal. Mood & affect appropriate.     Data Reviewed: Labs reviewed and are as vasovagal.  Creatinine 1.94 There are no new results to review at this time.  Family Communication: Plan of care discussed with patient and her husband at the bedside  Disposition: Status is: Inpatient Remains inpatient appropriate because:   Planned Discharge Destination: Home with Home Health    Time spent: 33 minutes  Author: Lucile Shutters, MD 05/12/2023 1:40 PM  For on call review [email or web address].

## 2023-05-12 NOTE — Progress Notes (Signed)
PULMONOLOGY         Date: 05/12/2023,   MRN# [id number] Dawn Bruce 1945-08-11     AdmissionWeight: 54.4 kg                 CurrentWeight: 67.2 kg  Referring provider: Dr Gilda Crease   CHIEF COMPLAINT:   Complete atelectasis of left lung with bilateral pleural effusions.    HISTORY OF PRESENT ILLNESS   77 year old female with a history of chronic GERD, basal cell carcinoma squamous cell carcinoma history of NSTEMI diastolic CHF, CAD status post recent PCI with DES, chronic A-fib on Eliquis, essential hypertension, dyslipidemia CKD 3A chronic pain syndrome remote perforated appendicitis, hospitalized November 2024 with non-STEMI.  Post left heart cath had developed retroperitoneal hematoma and hemorrhagic shock with complications of iliac artery laceration.  Status post surgical repair via vascular surgery service.  Had outpatient therapy with antibiotics with failure came in with transient hypotension and circulatory shock.  Found to have elevated cardiac biomarkers and acute on chronic CHF.  Vascular surgery was reconsulted today and PCCM consultation was placed to incidental finding of complete atelectasis of the left lung with surrounding bilateral pleural effusions worse on the left.  04/25/23- patient evaluated in PACU post op , appears to be mildy sedated still. Mildy hypotensive at this moment.  Lung sounds much improved post 1.6L left pleural space aspiration.  04/26/23- patient seen and examined , Dawn Bruce (daughter) at bedside.  Patient receiving blood transfusion.  Lung sounds are clear , vitals are stable.  Hypoalbulminemia continues. Will order urine protein and pre albumin levels,  because patient shares she's eating well.  CXR in am to check interval effusion.  04/27/23- patient seen at bedside, husband and daughter present.  For surgery wound washout and vac exchange today.   Vitals stable , room air spO2 99%     04/30/23- patient seen at bedside.  Has  additional surgery planned with debridement.  CXR with recurring effusion and associated compressive atelelctasis.   Ideally would like to increase diuresis and recruitment maunevers.  E. Faecallis noted on wound culture.  I discussed case with Dr Avon Gully infectious disease specialist today.  Dr Dareen Piano is also considering pleurX catheter which patient may need if this does not resolve with medical management. She already had multiple thoracentesis. No noew labs are available for review but are in process. Will order purewick for urination due to physical weakness.      05/01/23-  patient reports improvement post diuresis.  Will obtain interval CXR today.  We discussed PleurX permanent tunneled catheter and she declined to have this done at the moment.  She is on room air with normoxia.  She developed mild contraction and we will take diuretic holiday.   05/02/23- patient with severe hypotension.  I have dcd her beta blocker today and imdur. She is now getting IV fluids and is able to speak.  Will start midodrine 5mg  po tid.  05/03/23- patient is now no longer hypotensive.  She had AKI and this is very likely related to circulatory shock over past 24h.  She had fluid via IV and I'm concerned regarding reacculumation of pleural fluid so I have dcd this now since she is hypertensive.  There is nephrology consult in progress.  05/04/23- patient is improved clinically, she has improved vital signs,  she does have AKI but she is making light yellow urine.  She has nephrology evaluation.  There is consideration for HD.  05/06/23- patient clinically  reporting mildy worsening SOB.  Repeat CXR reviewed by me with improved right hemithorax but worse on left with reaccumulated effusion moderate.  She has progressive thrombocytopenia with today platelets <51, does not appear toxic at all and is lucid alert and appropriate.  She has PCI x2 in the past.  Patient is uremic and am concerned regarding potential for  bleeding but same time concern for stent thrombosis/clotting with AF/CAD.  Have discussed with team and will ask for additional evaluation with cardiology.  Dr Chipper Herb has also notified oncology to help with blood abnormalities.   05/07/23- patient continues to produce adequate urine light yellow in color renal function is improved, respiratory status however seems  more labored today.  Her pleural effusion has re-accumulated and we will order US thoracentesis today. She had oncology and cardiology evaluation.  It seems thrombocytopenia may be due to Zosyn.   She had vascular surgery evaluation today.   05/08/23- patient seen at bedside.  She reports sudden onset of cough after eating yesterday, suspect due to aspiration.  She had some nuts while laying in bed. We discussed this and asked to perform aspiration precautions with meals. She had Left thoracentesis with improved CXR post procedure.  Today we plan to drain right pleural space. She has improved renal function and is urinating adequately.  She has multiple specialists following her care, appreciate everyone involved.  Her vitals appear stable.  Platelet counts have improved.  She is ambulatory with PT/OT.  Overall she is clinically improved.  05/09/23- patient is further improved.  She is ambulatory without dyspnea.  AKI is improved. CXR reviwed and improved. Plan for OR in am.  05/10/23- patient further improved.  Bloodwork is better with improved renal function and platelet count. Left hemithorax with reduced lung sounds.  She reports dressing from groin is loose. Plan for cxr in am. She on cipro and augmentin. Using IS and working with PT.  05/11/23- patient s/p thoracentesis  today, she is breathing better and is off O2.  Renal fucntion is better. I met with husband he was asking for O2 for home use.  She may infact need this and we will order today home O2.  She still requires therapy for infection plan for dc on Sunday.   05/12/23- patient seen at  bedside. H/h slighly lower today.  Mild tenderness at right groin but clinically denies that its worse.  She had pRBC transfusion x 5 already and does not seem to be allergic to blood products.  Ive discussed improving her blood counts prior to dc home to help with severe fatigue, dyspnea, and due to blood loss she had while hospitalized. Her kidneys are improved. CXR repeat today.   PAST MEDICAL HISTORY   Past Medical History:  Diagnosis Date   Actinic keratosis    Chronic pain    GERD (gastroesophageal reflux disease) ?   Taking Pepcid   History of basal cell carcinoma (BCC) 05/02/2021   right upper back paraspinal   History of SCC (squamous cell carcinoma) of skin 09/10/2019   left dorsum wrist ED&C done 10/28/19   History of SCC (squamous cell carcinoma) of skin 03/04/2020   right dorsum hand   History of squamous cell carcinoma in situ (SCCIS) 03/04/2020   left dorsum wrist  ED&C   History of squamous cell carcinoma in situ (SCCIS) 03/04/2020   right medial infraorbital  ED&C 04/28/2020   History of squamous cell carcinoma in situ (SCCIS) 10/28/2019   right dorsum hand proximal  lateral   History of squamous cell carcinoma in situ (SCCIS) 10/28/2019   right dorsum hand proximal medial   Hyperlipidemia    Hypertension    NSTEMI (non-ST elevated myocardial infarction) (HCC) 04/12/2023   Renal disorder    Squamous cell carcinoma in situ 10/31/2021   left distal  tricep, EDC   Squamous cell carcinoma of skin 08/28/2022   Right volar forearm - [facility]     SURGICAL HISTORY   Past Surgical History:  Procedure Laterality Date   ABDOMINAL HYSTERECTOMY     APPENDECTOMY  April 2023   Burst appendix   APPLICATION OF WOUND VAC Right 04/25/2023   Procedure: APPLICATION OF WOUND VAC;  Surgeon: Renford Dills, MD;  Location: [facility] ORS;  Service: Vascular;  Laterality: Right;   APPLICATION OF WOUND VAC Right 04/27/2023   Procedure: WOUND VAC EXCHANGE;  Surgeon: Renford Dills, MD;   Location: [facility];  Service: Vascular;  Laterality: Right;   APPLICATION OF WOUND VAC Right 05/01/2023   Procedure: APPLICATION OF WOUND VAC;  Surgeon: Renford Dills, MD;  Location: [facility];  Service: Vascular;  Laterality: Right;   AUGMENTATION MAMMAPLASTY Bilateral    25-30 years ago   CARDIOVERSION N/A 12/01/2020   Procedure: CARDIOVERSION;  Surgeon: Debbe Odea, MD;  Location: [facility];  Service: Cardiovascular;  Laterality: N/A;   CAROTID ARTERY ANGIOPLASTY Right 1995(approximate)   CAROTID ENDARTERECTOMY  2001   CORONARY STENT INTERVENTION N/A 04/13/2023   Procedure: CORONARY STENT INTERVENTION;  Surgeon: Marcina Millard, MD;  Location: [facility];  Service: Cardiovascular;  Laterality: N/A;   COSMETIC SURGERY     ELBOW SURGERY Left    ENDARTERECTOMY FEMORAL Right 04/13/2023   Procedure: Right groin exploration with repair of right external iliac artery and right circumflex artery;  Surgeon: Bertram Denver, MD;  Location: [facility];  Service: Vascular;  Laterality: Right;   FRACTURE SURGERY  2016   Arm   INCISION AND DRAINAGE OF WOUND Right 04/25/2023   Procedure: IRRIGATION AND DEBRIDEMENT WOUND;  Surgeon: Renford Dills, MD;  Location: [facility];  Service: Vascular;  Laterality: Right;   INCISION AND DRAINAGE OF WOUND Right 04/27/2023   Procedure: IRRIGATION AND DEBRIDEMENT WOUND;  Surgeon: Renford Dills, MD;  Location: [facility];  Service: Vascular;  Laterality: Right;   INCISION AND DRAINAGE OF WOUND Right 05/01/2023   Procedure: IRRIGATION AND DEBRIDEMENT WOUND;  Surgeon: Renford Dills, MD;  Location: [facility];  Service: Vascular;  Laterality: Right;   LEFT HEART CATH AND CORONARY ANGIOGRAPHY N/A 04/13/2023   Procedure: LEFT HEART CATH AND CORONARY ANGIOGRAPHY;  Surgeon: Marcina Millard, MD;  Location: [facility];  Service: Cardiovascular;  Laterality: N/A;   THORACENTESIS N/A 12/27/2020   Procedure: Alanson Puls;  Surgeon:  Josephine Igo, DO;  Location: [facility];  Service: Pulmonary;  Laterality: N/A;   TONSILLECTOMY     TUBAL LIGATION  ?     FAMILY HISTORY   Family History  Problem Relation Age of Onset   Cancer Mother    Cancer Father    Cancer Sister    Breast cancer Neg Hx      SOCIAL HISTORY   Social History   Tobacco Use   Smoking status: Former    Current packs/day: 0.00    Average packs/day: 0.3 packs/day for 15.0 years (3.8 ttl pk-yrs)    Types: Cigarettes    Start date: 35    Quit date: 1990    Years since  quitting: 34.9    Passive exposure: Past   Smokeless tobacco: Never   Tobacco comments:    quit around 1990-1995 (?)  Vaping Use   Vaping status: Never Used  Substance Use Topics   Alcohol use: Yes    Alcohol/week: 14.0 standard drinks of alcohol    Types: 14 Standard drinks or equivalent per week    Comment: 2 scotch shots   Drug use: Never     MEDICATIONS    Home Medication:     Current Medication:  Current Facility-Administered Medications:    acetaminophen (TYLENOL) tablet 650 mg, 650 mg, Oral, Q6H PRN, Schnier, Latina Craver, MD, 650 mg at 05/12/23 0825   albuterol (PROVENTIL) (2.5 MG/3ML) 0.083% nebulizer solution 2.5 mg, 2.5 mg, Nebulization, Q4H PRN, Schnier, Latina Craver, MD, 2.5 mg at 05/12/23 0811   alum & mag hydroxide-simeth (MAALOX/MYLANTA) 200-200-20 MG/5ML suspension 15 mL, 15 mL, Oral, Once, Schnier, Latina Craver, MD   amoxicillin-clavulanate (AUGMENTIN) 500-125 MG per tablet 1 tablet, 1 tablet, Oral, BID, Aleda Grana, RPH, 1 tablet at 05/12/23 1610   apixaban (ELIQUIS) tablet 2.5 mg, 2.5 mg, Oral, BID, Schnier, Latina Craver, MD, 2.5 mg at 05/12/23 9604   aspirin EC tablet 81 mg, 81 mg, Oral, Daily, Schnier, Latina Craver, MD, 81 mg at 05/12/23 5409   atorvastatin (LIPITOR) tablet 40 mg, 40 mg, Oral, Daily, Karna Christmas, Sherron Mummert, MD, 40 mg at 05/12/23 8119   benzonatate (TESSALON) capsule 200 mg, 200 mg, Oral, TID PRN, Vida Rigger, MD, 200 mg at  05/09/23 1703   carvedilol (COREG) tablet 12.5 mg, 12.5 mg, Oral, BID WC, Marrion Coy, MD, 12.5 mg at 05/12/23 1478   Chlorhexidine Gluconate Cloth 2 % PADS 6 each, 6 each, Topical, Daily, Schnier, Latina Craver, MD, 6 each at 05/12/23 0816   ciprofloxacin (CIPRO) tablet 500 mg, 500 mg, Oral, Q breakfast, Ravishankar, Jayashree, MD, 500 mg at 05/12/23 2956   clopidogrel (PLAVIX) tablet 75 mg, 75 mg, Oral, Daily, Schnier, Latina Craver, MD, 75 mg at 05/12/23 2130   ezetimibe (ZETIA) tablet 10 mg, 10 mg, Oral, Daily, Schnier, Latina Craver, MD, 10 mg at 05/12/23 8657   feeding supplement (ENSURE ENLIVE / ENSURE PLUS) liquid 237 mL, 237 mL, Oral, BID BM, Marrion Coy, MD, 237 mL at 05/12/23 0816   fentaNYL (DURAGESIC) 75 MCG/HR 1 patch, 1 patch, Transdermal, Q72H, Schnier, Latina Craver, MD, 1 patch at 05/11/23 8469   oxyCODONE (Oxy IR/ROXICODONE) immediate release tablet 5 mg, 5 mg, Oral, Q6H PRN, Schnier, Latina Craver, MD, 5 mg at 05/04/23 0008   phenylephrine ((USE for PREPARATION-H)) 0.25 % suppository 1 suppository, 1 suppository, Rectal, PRN, Schnier, Latina Craver, MD   pregabalin (LYRICA) capsule 75 mg, 75 mg, Oral, Daily, Marrion Coy, MD, 75 mg at 05/12/23 6295   sodium chloride flush (NS) 0.9 % injection 10-40 mL, 10-40 mL, Intracatheter, Q12H, Schnier, Latina Craver, MD, 10 mL at 05/12/23 0816   sodium chloride flush (NS) 0.9 % injection 10-40 mL, 10-40 mL, Intracatheter, PRN, Schnier, Latina Craver, MD, 10 mL at 05/11/23 2101   traZODone (DESYREL) tablet 50 mg, 50 mg, Oral, QHS, Schnier, Latina Craver, MD, 50 mg at 05/11/23 2057   witch hazel-glycerin (TUCKS) pad, , Topical, PRN, Marrion Coy, MD    ALLERGIES   Patient has no known allergies.     REVIEW OF SYSTEMS    Review of Systems:  Gen:  Denies  fever, sweats, chills weigh loss  HEENT: Denies blurred vision, double vision, ear pain, eye pain,  hearing loss, nose bleeds, sore throat Cardiac:  No dizziness, chest pain or heaviness, chest  tightness,edema Resp:   reports dyspnea chronically  Gi: Denies swallowing difficulty, stomach pain, nausea or vomiting, diarrhea, constipation, bowel incontinence Gu:  Denies bladder incontinence, burning urine Ext:   Denies Joint pain, stiffness or swelling Skin: Denies  skin rash, easy bruising or bleeding or hives Endoc:  Denies polyuria, polydipsia , polyphagia or weight change Psych:   Denies depression, insomnia or hallucinations   Other:  All other systems negative   VS: BP 133/72   Pulse 92   Temp 98.6 F (37 C) (Oral)   Resp 20   Ht 5' 2.99" (1.6 m)   Wt 67.2 kg   SpO2 97%   BMI 26.25 kg/m      PHYSICAL EXAM    GENERAL:NAD, no fevers, chills, no weakness no fatigue HEAD: Normocephalic, atraumatic.  EYES: Pupils equal, round, reactive to light. Extraocular muscles intact. No scleral icterus.  MOUTH: Moist mucosal membrane. Dentition intact. No abscess noted.  EAR, NOSE, THROAT: Clear without exudates. No external lesions.  NECK: Supple. No thyromegaly. No nodules. No JVD.  PULMONARY: decreased breath sounds with mild rhonchi worse at bases bilaterally.  CARDIOVASCULAR: S1 and S2. Regular rate and rhythm. No murmurs, rubs, or gallops. No edema. Pedal pulses 2+ bilaterally.  GASTROINTESTINAL: Soft, nontender, nondistended. No masses. Positive bowel sounds. No hepatosplenomegaly.  MUSCULOSKELETAL: No swelling, clubbing, or edema. Range of motion full in all extremities.  NEUROLOGIC: Cranial nerves II through XII are intact. No gross focal neurological deficits. Sensation intact. Reflexes intact.  SKIN: No ulceration, lesions, rashes, or cyanosis. Skin warm and dry. Turgor intact.  PSYCHIATRIC: Mood, affect within normal limits. The patient is awake, alert and oriented x 3. Insight, judgment intact.       IMAGING     ASSESSMENT/PLAN    Complete atelectasis of left lung- IMPROVED    - patient s/p diuresis with partial improvement however still has minimal lung  sounds on left.   - she does have CKD 3 and may not produce adequate fluid removal via medication alone   - we discussed options for thoracentesis and patient would like to proceed with this procedure.  -incentive spirometry at bedside to be used multiple times daily  - PT/OT - etiology is likely poor nutrition post ICU hospitalization with CHF and CKD all contributing to third spaced interstitial edema , peripheral edema and effusion -patient improved clinically.  H/h slighly lower with some blood in effusion noted.  Discussed pRBC.     Bilateral pleural effusions    - s/p throacentesis- multiple times   - consultation for IR with US guided throacentesis - therapeutic    -patient on eliquis -prealbumin -urine protein -holding diuresis today due to [facility] and will continue to monitor renal function.  I anticipate this should improve with normal BP now. -appreciate nephrology consultation  -05/06/23- CXR with reaccumuation of left effusion - treated with thoracentesis 12/17- ordered right thoracentesis  Severe AKI stage 5- RESOLVED     Nephrology on case -appreciate input     - non oliguric at this moment , no HD today per specialist    AF with CAD s/p PCI x 2   - patient on plavix and eliquis     - s/p complicated surgery with multiple transfusions.    -Thrombocytopenia improved    Severe thrombocytopenia   - oncology on case - appreciate input - Dr B   - workup in  progress - platelet count trending up   Sepsis due to right groin cellulitis   S/p vascular surgery eval S/p vanco /rocephin  - patient appears to be clinically improved.  - s/p ID revaluation - appreciate input  -E faecalis noted on wound care   Moderate protein calorie malnutrition   - nutritional consultation    - bitemporal wasting and peripheral muscle weakness noted   - contributing to edema/effusion   - complicating tissue healing  -high protein diet --patient aspirated on evening of 05/07/23  Thank  you for allowing me to participate in the care of this patient.  Patient/Family are satisfied with care plan and all questions have been answered.    Provider disclosure: Patient with at least one acute or chronic illness or injury that poses a threat to life or bodily function and is being managed actively during this encounter.  All of the below services have been performed independently by signing provider:  review of prior documentation from internal and or external health records.  Review of previous and current lab results.  Interview and comprehensive assessment during patient visit today. Review of current and previous chest radiographs/CT scans. Discussion of management and test interpretation with health care team and patient/family.   This document was prepared using Dragon voice recognition software and may include unintentional dictation errors.     Vida Rigger, M.D.  Division of Pulmonary & Critical Care Medicine

## 2023-05-12 NOTE — Progress Notes (Signed)
PT Cancellation Note  Patient Details Name: Dawn Bruce MRN: [id number] DOB: [birth year]   Cancelled Treatment:     PT attempt. Pt sleeping upon arrival. She easily awakes but politely requested to let her rest at this time. Supportive spouse at bedside states she hasn't been resting very well. Acute PT will continue to follow and progress per current POC. Will return at a later time/date as requested.    Rushie Chestnut 05/12/2023, 11:40 AM

## 2023-05-12 NOTE — Progress Notes (Signed)
[facility]  ROUNDING NOTE   Subjective:   Dawn Bruce  is a 77 y.o. female with medical problems of hypertension, heart failure with preserved ejection fraction, history of pleural effusions, history of diffuse vascular disease including coronary artery disease-history of PCI in LAD stent October 2023, chronic kidney disease, prediabetes, atrial fibrillation status post cardioversion in 2022 followed by Dr. Alyson Locket at [facility] nephrology. Patient presents to the ED with hypotension and suspected wound infection. She has been admitted for Cellulitis of right groin [L03.314] Cellulitis of right thigh [L03.115]  Patient is known to our practice from previous admission.   Patient seen resting in bed Husband at bedside Alert and oriented Tolerating small meals Denies nausea vomiting   Objective:  Vital signs in last 24 hours:  Temp:  [98.1 F (36.7 C)-99.1 F (37.3 C)] 98.6 F (37 C) (12/21 0814) Pulse Rate:  [83-94] 92 (12/21 0814) Resp:  [16-20] 20 (12/20 2126) BP: (132-153)/(51-77) 133/72 (12/21 0814) SpO2:  [94 %-99 %] 97 % (12/21 0814) Weight:  [67.2 kg] 67.2 kg (12/21 0416)  Weight change: 0.9 kg Filed Weights   05/10/23 0500 05/11/23 0427 05/12/23 0416  Weight: 69.6 kg 66.3 kg 67.2 kg    Intake/Output: I/O last 3 completed shifts: In: 400 [P.O.:400] Out: 1450 [Urine:1350; Drains:100]   Intake/Output this shift:  No intake/output data recorded.  Physical Exam: General: NAD  Head: Normocephalic, atraumatic. Moist oral mucosal membranes  Eyes: Anicteric  Lungs:  Clear to auscultation.normal effort  Heart: Regular rate and rhythm  Abdomen:  Soft, nontender  Extremities:  No peripheral edema.  Neurologic: Alert and oriented, moving all four extremities  Skin: No lesions       Basic Metabolic Panel: Recent Labs  Lab 05/06/23 0509 05/07/23 0540 05/08/23 0338 05/09/23 0530 05/10/23 0645 05/11/23 0350 05/12/23 0618  NA 137 138 140 141  --   --   144  K 4.6 4.4 4.3 4.4  --   --  4.1  CL 105 105 108 106  --   --  112*  CO2 22 25 24 25   --   --  25  GLUCOSE 105* 107* 118* 104*  --   --  96  BUN 92* 87* 83* 69*  --   --  48*  CREATININE 4.67* 4.46* 4.08* 3.24* 2.72* 2.45* 1.94*  CALCIUM 8.3* 8.5* 8.4* 8.4*  --   --  8.3*    Liver Function Tests: Recent Labs  Lab 05/12/23 0618  AST 17  ALT 9  ALKPHOS 44  BILITOT 0.6  PROT 4.8*  ALBUMIN 2.2*    No results for input(s): "LIPASE", "AMYLASE" in the last 168 hours. No results for input(s): "AMMONIA" in the last 168 hours.  CBC: Recent Labs  Lab 05/06/23 1338 05/07/23 0540 05/08/23 0338 05/09/23 0530 05/10/23 1347 05/12/23 0618  WBC 7.2 6.3 7.1 7.0 6.8 5.0  NEUTROABS 5.4  --   --   --   --  3.5  HGB 9.8* 9.0* 8.3* 7.8* 8.2* 7.6*  HCT 29.8* 27.6* 25.2* 24.1* 25.9* 23.7*  MCV 87.9 87.1 87.2 88.9 91.8 90.1  PLT 50* 39* 43* 69* 135* 175    Cardiac Enzymes: Recent Labs  Lab 05/07/23 1822  CKTOTAL 39    BNP: Invalid input(s): "POCBNP"  CBG: No results for input(s): "GLUCAP" in the last 168 hours.   Microbiology: Results for orders placed or performed during the hospital encounter of 04/22/23  Blood Culture (routine x 2)  Status: None   Collection Time: 04/22/23  3:28 PM   Specimen: BLOOD  Result Value Ref Range Status   Specimen Description BLOOD RIGHT ANTECUBITAL  Final   Special Requests   Final    BOTTLES DRAWN AEROBIC AND ANAEROBIC Blood Culture results may not be optimal due to an excessive volume of blood received in culture bottles   Culture   Final    NO GROWTH 5 DAYS Performed at [facility], [address] [place], [place] [address]    Report Status 04/27/2023 FINAL  Final  C Difficile Quick Screen w PCR reflex     Status: None   Collection Time: 04/22/23  4:19 PM   Specimen: STOOL  Result Value Ref Range Status   C Diff antigen NEGATIVE NEGATIVE Final   C Diff toxin NEGATIVE NEGATIVE Final   C Diff interpretation No C.  difficile detected.  Final    Comment: Performed at [facility], [address] [place], [place] [address]  Gastrointestinal Panel by PCR , Stool     Status: None   Collection Time: 04/22/23  4:25 PM   Specimen: Stool  Result Value Ref Range Status   Campylobacter species NOT DETECTED NOT DETECTED Final   Plesimonas shigelloides NOT DETECTED NOT DETECTED Final   Salmonella species NOT DETECTED NOT DETECTED Final   Yersinia enterocolitica NOT DETECTED NOT DETECTED Final   Vibrio species NOT DETECTED NOT DETECTED Final   Vibrio cholerae NOT DETECTED NOT DETECTED Final   Enteroaggregative E coli (EAEC) NOT DETECTED NOT DETECTED Final   Enteropathogenic E coli (EPEC) NOT DETECTED NOT DETECTED Final   Enterotoxigenic E coli (ETEC) NOT DETECTED NOT DETECTED Final   Shiga like toxin producing E coli (STEC) NOT DETECTED NOT DETECTED Final   Shigella/Enteroinvasive E coli (EIEC) NOT DETECTED NOT DETECTED Final   Cryptosporidium NOT DETECTED NOT DETECTED Final   Cyclospora cayetanensis NOT DETECTED NOT DETECTED Final   Entamoeba histolytica NOT DETECTED NOT DETECTED Final   Giardia lamblia NOT DETECTED NOT DETECTED Final   Adenovirus F40/41 NOT DETECTED NOT DETECTED Final   Astrovirus NOT DETECTED NOT DETECTED Final   Norovirus GI/GII NOT DETECTED NOT DETECTED Final   Rotavirus A NOT DETECTED NOT DETECTED Final   Sapovirus (I, II, IV, and V) NOT DETECTED NOT DETECTED Final    Comment: Performed at [facility], [address] [place], [place] [address]  Blood Culture (routine x 2)     Status: None   Collection Time: 04/23/23  6:31 AM   Specimen: BLOOD  Result Value Ref Range Status   Specimen Description BLOOD BLOOD RIGHT HAND  Final   Special Requests   Final    BOTTLES DRAWN AEROBIC AND ANAEROBIC Blood Culture results may not be optimal due to an inadequate volume of blood received in culture bottles   Culture   Final    NO GROWTH 5 DAYS Performed at [facility], [address] [place], [place] [address]    Report Status 04/28/2023 FINAL  Final  Aerobic Culture w Gram Stain (superficial specimen)     Status: None   Collection Time: 04/23/23 10:42 AM   Specimen: Groin  Result Value Ref Range Status   Specimen Description   Final    GROIN Performed at [facility], [address] [place], [place] [address]    Special Requests   Final    NONE Performed at [facility], [address], [place], [place] [address]  Gram Stain   Final    RARE WBC PRESENT, PREDOMINANTLY PMN RARE GRAM NEGATIVE RODS RARE GRAM NEGATIVE COCCOBACILLI Performed at [facility], [address], [place], [place] [address]    Culture   Final    MODERATE MORGANELLA MORGANII FEW ESCHERICHIA COLI FEW PROTEUS MIRABILIS    Report Status 04/26/2023 FINAL  Final   Organism ID, Bacteria ESCHERICHIA COLI  Final   Organism ID, Bacteria MORGANELLA MORGANII  Final   Organism ID, Bacteria PROTEUS MIRABILIS  Final      Susceptibility   Escherichia coli - MIC*    AMPICILLIN >=32 RESISTANT Resistant     CEFEPIME <=0.12 SENSITIVE Sensitive     CEFTAZIDIME <=1 SENSITIVE Sensitive     CEFTRIAXONE <=0.25 SENSITIVE Sensitive     CIPROFLOXACIN <=0.25 SENSITIVE Sensitive     GENTAMICIN <=1 SENSITIVE Sensitive     IMIPENEM <=0.25 SENSITIVE Sensitive     TRIMETH/SULFA <=20 SENSITIVE Sensitive     AMPICILLIN/SULBACTAM 16 INTERMEDIATE Intermediate     PIP/TAZO <=4 SENSITIVE Sensitive ug/mL    * FEW ESCHERICHIA COLI   Morganella morganii - MIC*    AMPICILLIN >=32 RESISTANT Resistant     CEFTAZIDIME <=1 SENSITIVE Sensitive     CIPROFLOXACIN <=0.25 SENSITIVE Sensitive     GENTAMICIN <=1 SENSITIVE Sensitive     IMIPENEM 4 SENSITIVE Sensitive     TRIMETH/SULFA <=20 SENSITIVE Sensitive     AMPICILLIN/SULBACTAM 16 INTERMEDIATE Intermediate     PIP/TAZO <=4 SENSITIVE Sensitive ug/mL    * MODERATE MORGANELLA MORGANII   Proteus mirabilis - MIC*     AMPICILLIN <=2 SENSITIVE Sensitive     CEFEPIME <=0.12 SENSITIVE Sensitive     CEFTAZIDIME <=1 SENSITIVE Sensitive     CEFTRIAXONE <=0.25 SENSITIVE Sensitive     CIPROFLOXACIN <=0.25 SENSITIVE Sensitive     GENTAMICIN <=1 SENSITIVE Sensitive     IMIPENEM 2 SENSITIVE Sensitive     TRIMETH/SULFA <=20 SENSITIVE Sensitive     AMPICILLIN/SULBACTAM <=2 SENSITIVE Sensitive     PIP/TAZO <=4 SENSITIVE Sensitive ug/mL    * FEW PROTEUS MIRABILIS  Aerobic/Anaerobic Culture w Gram Stain (surgical/deep wound)     Status: None   Collection Time: 04/25/23  2:41 PM   Specimen: Wound; Body Fluid  Result Value Ref Range Status   Specimen Description   Final    WOUND Performed at [facility], [address] [place], [place] [address]    Special Requests RETROPERITONEAL HEMATOMA SPEC A  Final   Gram Stain   Final    FEW WBC PRESENT,BOTH PMN AND MONONUCLEAR NO ORGANISMS SEEN    Culture   Final    RARE ENTEROCOCCUS FAECALIS NO ANAEROBES ISOLATED Performed at [facility], [address] [place], [place] [address]    Report Status 05/02/2023 FINAL  Final   Organism ID, Bacteria ENTEROCOCCUS FAECALIS  Final      Susceptibility   Enterococcus faecalis - MIC*    AMPICILLIN <=2 SENSITIVE Sensitive     VANCOMYCIN 2 SENSITIVE Sensitive     GENTAMICIN SYNERGY SENSITIVE Sensitive     * RARE ENTEROCOCCUS FAECALIS    Coagulation Studies: No results for input(s): "LABPROT", "INR" in the last 72 hours.  Urinalysis: No results for input(s): "COLORURINE", "LABSPEC", "PHURINE", "GLUCOSEU", "HGBUR", "BILIRUBINUR", "KETONESUR", "PROTEINUR", "UROBILINOGEN", "NITRITE", "LEUKOCYTESUR" in the last 72 hours.  Invalid input(s): "APPERANCEUR"    Imaging: DG Chest Port 1 View Result Date: 05/11/2023 CLINICAL DATA:  Pleural effusion status post thoracentesis. EXAM: PORTABLE  CHEST 1 VIEW COMPARISON:  Chest radiograph dated 05/11/2023. FINDINGS: Small bilateral pleural effusions. Interval decrease  in the size of left pleural effusion since the prior radiograph. No obvious pneumothorax. There are bibasilar atelectasis or infiltrate. Right-sided PICC in similar position. Stable cardiac silhouette. No acute osseous pathology. IMPRESSION: Small bilateral pleural effusions. Interval decrease in the size of left pleural effusion. No obvious pneumothorax. Electronically Signed   By: Elgie Collard M.D.   On: 05/11/2023 18:02   US THORACENTESIS ASP PLEURAL SPACE W/IMG GUIDE Result Date: 05/11/2023 INDICATION: Patient with congestive heart failure, bilateral pleural effusions, left greater than right. Request for left-sided therapeutic thoracentesis. EXAM: ULTRASOUND GUIDED THERAPEUTIC THORACENTESIS MEDICATIONS: 8 cc of 1% lidocaine COMPLICATIONS: None immediate. PROCEDURE: An ultrasound guided thoracentesis was thoroughly discussed with the patient and questions answered. The benefits, risks, alternatives and complications were also discussed. The patient understands and wishes to proceed with the procedure. Written consent was obtained. Ultrasound was performed to localize and mark an adequate pocket of fluid in the left chest. The area was then prepped and draped in the normal sterile fashion. 1% Lidocaine was used for local anesthesia. Under ultrasound guidance a 6 Fr Safe-T-Centesis catheter was introduced. Thoracentesis was performed. The catheter was removed and a dressing applied. FINDINGS: A total of approximately 1.4 L of dark red pleural fluid was removed. IMPRESSION: Successful ultrasound guided left thoracentesis yielding 1.4 L of pleural fluid. Performed by Buzzy Han, PA-C Electronically Signed   By: Acquanetta Belling M.D.   On: 05/11/2023 16:41   DG Chest Port 1 View Result Date: 05/11/2023 CLINICAL DATA:  Follow-up left pleural effusion. EXAM: PORTABLE CHEST 1 VIEW COMPARISON:  05/08/2023 FINDINGS: There is a right arm PICC line with tip in the projection of the superior cavoatrial junction.  Cardiomediastinal contours are unchanged. New small right pleural effusion with overlying atelectasis. Interval increase opacification within the left mid and left lower lung which may reflect progressive pleural effusion, atelectasis, and or airspace disease. IMPRESSION: 1. New small right pleural effusion with overlying atelectasis. 2. Interval increase opacification within the left mid and left lower lung which may reflect progressive pleural effusion, atelectasis, and/or airspace disease. Electronically Signed   By: Signa Kell M.D.   On: 05/11/2023 11:25     Medications:      alum & mag hydroxide-simeth  15 mL Oral Once   amoxicillin-clavulanate  1 tablet Oral BID   apixaban  2.5 mg Oral BID   aspirin EC  81 mg Oral Daily   atorvastatin  40 mg Oral Daily   carvedilol  12.5 mg Oral BID WC   Chlorhexidine Gluconate Cloth  6 each Topical Daily   ciprofloxacin  500 mg Oral Q breakfast   clopidogrel  75 mg Oral Daily   ezetimibe  10 mg Oral Daily   feeding supplement  237 mL Oral BID BM   fentaNYL  1 patch Transdermal Q72H   pregabalin  75 mg Oral Daily   sodium chloride flush  10-40 mL Intracatheter Q12H   traZODone  50 mg Oral QHS   acetaminophen, albuterol, benzonatate, oxyCODONE, phenylephrine, sodium chloride flush, witch hazel-glycerin  Assessment/ Plan:  Dawn Bruce is a 77 y.o.  female with medical problems of hypertension, heart failure with preserved ejection fraction, history of pleural effusions, history of diffuse vascular disease including coronary artery disease-history of PCI in LAD stent October 2023, chronic kidney disease, prediabetes, atrial fibrillation status post cardioversion in 2022 followed by Dr. Alyson Locket at [facility] nephrology.  Patient presents to the ED with hypotension and suspected wound infection. She has been admitted for Cellulitis of right groin [L03.314] Cellulitis of right thigh [L03.115]   Acute Kidney Injury on chronic kidney disease stage IIIb  with baseline creatinine 1.3 and GFR of 42 on 03/22/23.  Acute kidney injury appears multifactorial, hypotension, IV contrast exposure, and diuretics. Creatinine on admission, 1.17.  Patient experienced hypertension with subsequent hypotension.  IV contrast exposure on 04/22/2023.  Received approximately 2 days of diuresis.    Renal function continues to improve.  Adequate oral intake.  Urine output adequate overnight.  Will continue to monitor during this admission.  Patient will need outpatient follow-up in our office at discharge.  Lab Results  Component Value Date   CREATININE 1.94 (H) 05/12/2023   CREATININE 2.45 (H) 05/11/2023   CREATININE 2.72 (H) 05/10/2023    Intake/Output Summary (Last 24 hours) at 05/12/2023 1109 Last data filed at 05/12/2023 0400 Gross per 24 hour  Intake 400 ml  Output 900 ml  Net -500 ml   2. Anemia of chronic kidney disease Lab Results  Component Value Date   HGB 7.6 (L) 05/12/2023    Hemoglobin 7.6 from 8.2.  Will monitor.  3.  Hypertension, home regimen includes carvedilol, isosorbide, and valsartan.  Currently receiving carvedilol only. Blood pressure acceptable for this patient.   LOS: 20 Elexa Kivi 12/21/202411:09 AM

## 2023-05-12 NOTE — Progress Notes (Signed)
PULMONOLOGY         Date: 05/12/2023,   MRN# [id number] Dawn Bruce 1945/12/17     AdmissionWeight: 54.4 kg                 CurrentWeight: 67.2 kg  Referring provider: Dr Gilda Crease   CHIEF COMPLAINT:   Complete atelectasis of left lung with bilateral pleural effusions.    HISTORY OF PRESENT ILLNESS   77 year old female with a history of chronic GERD, basal cell carcinoma squamous cell carcinoma history of NSTEMI diastolic CHF, CAD status post recent PCI with DES, chronic A-fib on Eliquis, essential hypertension, dyslipidemia CKD 3A chronic pain syndrome remote perforated appendicitis, hospitalized November 2024 with non-STEMI.  Post left heart cath had developed retroperitoneal hematoma and hemorrhagic shock with complications of iliac artery laceration.  Status post surgical repair via vascular surgery service.  Had outpatient therapy with antibiotics with failure came in with transient hypotension and circulatory shock.  Found to have elevated cardiac biomarkers and acute on chronic CHF.  Vascular surgery was reconsulted today and PCCM consultation was placed to incidental finding of complete atelectasis of the left lung with surrounding bilateral pleural effusions worse on the left.  04/25/23- patient evaluated in PACU post op , appears to be mildy sedated still. Mildy hypotensive at this moment.  Lung sounds much improved post 1.6L left pleural space aspiration.  04/26/23- patient seen and examined , [name] (daughter) at bedside.  Patient receiving blood transfusion.  Lung sounds are clear , vitals are stable.  Hypoalbulminemia continues. Will order urine protein and pre albumin levels,  because patient shares she's eating well.  CXR in am to check interval effusion.  04/27/23- patient seen at bedside, husband and daughter present.  For surgery wound washout and vac exchange today.   Vitals stable , room air spO2 99%     04/30/23- patient seen at bedside.  Has  additional surgery planned with debridement.  CXR with recurring effusion and associated compressive atelelctasis.   Ideally would like to increase diuresis and recruitment maunevers.  E. Faecallis noted on wound culture.  I discussed case with Dr Avon Gully infectious disease specialist today.  Dr Dareen Piano is also considering pleurX catheter which patient may need if this does not resolve with medical management. She already had multiple thoracentesis. No noew labs are available for review but are in process. Will order purewick for urination due to physical weakness.      05/01/23-  patient reports improvement post diuresis.  Will obtain interval CXR today.  We discussed PleurX permanent tunneled catheter and she declined to have this done at the moment.  She is on room air with normoxia.  She developed mild contraction and we will take diuretic holiday.   05/02/23- patient with severe hypotension.  I have dcd her beta blocker today and imdur. She is now getting IV fluids and is able to speak.  Will start midodrine 5mg  po tid.  05/03/23- patient is now no longer hypotensive.  She had AKI and this is very likely related to circulatory shock over past 24h.  She had fluid via IV and I'm concerned regarding reacculumation of pleural fluid so I have dcd this now since she is hypertensive.  There is nephrology consult in progress.  05/04/23- patient is improved clinically, she has improved vital signs,  she does have AKI but she is making light yellow urine.  She has nephrology evaluation.  There is consideration for HD.  05/06/23- patient clinically  reporting mildy worsening SOB.  Repeat CXR reviewed by me with improved right hemithorax but worse on left with reaccumulated effusion moderate.  She has progressive thrombocytopenia with today platelets <51, does not appear toxic at all and is lucid alert and appropriate.  She has PCI x2 in the past.  Patient is uremic and am concerned regarding potential for  bleeding but same time concern for stent thrombosis/clotting with AF/CAD.  Have discussed with team and will ask for additional evaluation with cardiology.  Dr Chipper Herb has also notified oncology to help with blood abnormalities.   05/07/23- patient continues to produce adequate urine light yellow in color renal function is improved, respiratory status however seems  more labored today.  Her pleural effusion has re-accumulated and we will order US thoracentesis today. She had oncology and cardiology evaluation.  It seems thrombocytopenia may be due to Zosyn.   She had vascular surgery evaluation today.   05/08/23- patient seen at bedside.  She reports sudden onset of cough after eating yesterday, suspect due to aspiration.  She had some nuts while laying in bed. We discussed this and asked to perform aspiration precautions with meals. She had Left thoracentesis with improved CXR post procedure.  Today we plan to drain right pleural space. She has improved renal function and is urinating adequately.  She has multiple specialists following her care, appreciate everyone involved.  Her vitals appear stable.  Platelet counts have improved.  She is ambulatory with PT/OT.  Overall she is clinically improved.  05/09/23- patient is further improved.  She is ambulatory without dyspnea.  AKI is improved. CXR reviwed and improved. Plan for OR in am.  05/10/23- patient further improved.  Bloodwork is better with improved renal function and platelet count. Left hemithorax with reduced lung sounds.  She reports dressing from groin is loose. Plan for cxr in am. She on cipro and augmentin. Using IS and working with PT.  05/11/23- patient s/p thoracentesis  today, she is breathing better and is off O2.  Renal fucntion is better. I met with husband he was asking for O2 for home use.  She may infact need this and we will order today home O2.  She still requires therapy for infection plan for dc on Sunday.     PAST MEDICAL HISTORY    Past Medical History:  Diagnosis Date   Actinic keratosis    Chronic pain    GERD (gastroesophageal reflux disease) ?   Taking Pepcid   History of basal cell carcinoma (BCC) 05/02/2021   right upper back paraspinal   History of SCC (squamous cell carcinoma) of skin 09/10/2019   left dorsum wrist ED&C done 10/28/19   History of SCC (squamous cell carcinoma) of skin 03/04/2020   right dorsum hand   History of squamous cell carcinoma in situ (SCCIS) 03/04/2020   left dorsum wrist  ED&C   History of squamous cell carcinoma in situ (SCCIS) 03/04/2020   right medial infraorbital  ED&C 04/28/2020   History of squamous cell carcinoma in situ (SCCIS) 10/28/2019   right dorsum hand proximal lateral   History of squamous cell carcinoma in situ (SCCIS) 10/28/2019   right dorsum hand proximal medial   Hyperlipidemia    Hypertension    NSTEMI (non-ST elevated myocardial infarction) (HCC) 04/12/2023   Renal disorder    Squamous cell carcinoma in situ 10/31/2021   left distal  tricep, EDC   Squamous cell carcinoma of skin 08/28/2022   Right volar forearm - EDC  SURGICAL HISTORY   Past Surgical History:  Procedure Laterality Date   ABDOMINAL HYSTERECTOMY     APPENDECTOMY  April 2023   Burst appendix   APPLICATION OF WOUND VAC Right 04/25/2023   Procedure: APPLICATION OF WOUND VAC;  Surgeon: Renford Dills, MD;  Location: [facility];  Service: Vascular;  Laterality: Right;   APPLICATION OF WOUND VAC Right 04/27/2023   Procedure: WOUND VAC EXCHANGE;  Surgeon: Renford Dills, MD;  Location: [facility];  Service: Vascular;  Laterality: Right;   APPLICATION OF WOUND VAC Right 05/01/2023   Procedure: APPLICATION OF WOUND VAC;  Surgeon: Renford Dills, MD;  Location: [facility];  Service: Vascular;  Laterality: Right;   AUGMENTATION MAMMAPLASTY Bilateral    25-30 years ago   CARDIOVERSION N/A 12/01/2020   Procedure: CARDIOVERSION;  Surgeon: Debbe Odea, MD;  Location: [facility];   Service: Cardiovascular;  Laterality: N/A;   CAROTID ARTERY ANGIOPLASTY Right 1995(approximate)   CAROTID ENDARTERECTOMY  2001   CORONARY STENT INTERVENTION N/A 04/13/2023   Procedure: CORONARY STENT INTERVENTION;  Surgeon: Marcina Millard, MD;  Location: [facility];  Service: Cardiovascular;  Laterality: N/A;   COSMETIC SURGERY     ELBOW SURGERY Left    ENDARTERECTOMY FEMORAL Right 04/13/2023   Procedure: Right groin exploration with repair of right external iliac artery and right circumflex artery;  Surgeon: Bertram Denver, MD;  Location: [facility];  Service: Vascular;  Laterality: Right;   FRACTURE SURGERY  2016   Arm   INCISION AND DRAINAGE OF WOUND Right 04/25/2023   Procedure: IRRIGATION AND DEBRIDEMENT WOUND;  Surgeon: Renford Dills, MD;  Location: [facility];  Service: Vascular;  Laterality: Right;   INCISION AND DRAINAGE OF WOUND Right 04/27/2023   Procedure: IRRIGATION AND DEBRIDEMENT WOUND;  Surgeon: Renford Dills, MD;  Location: [facility];  Service: Vascular;  Laterality: Right;   INCISION AND DRAINAGE OF WOUND Right 05/01/2023   Procedure: IRRIGATION AND DEBRIDEMENT WOUND;  Surgeon: Renford Dills, MD;  Location: [facility];  Service: Vascular;  Laterality: Right;   LEFT HEART CATH AND CORONARY ANGIOGRAPHY N/A 04/13/2023   Procedure: LEFT HEART CATH AND CORONARY ANGIOGRAPHY;  Surgeon: Marcina Millard, MD;  Location: [facility];  Service: Cardiovascular;  Laterality: N/A;   THORACENTESIS N/A 12/27/2020   Procedure: Alanson Puls;  Surgeon: Josephine Igo, DO;  Location: [facility];  Service: Pulmonary;  Laterality: N/A;   TONSILLECTOMY     TUBAL LIGATION  ?     FAMILY HISTORY   Family History  Problem Relation Age of Onset   Cancer Mother    Cancer Father    Cancer Sister    Breast cancer Neg Hx      SOCIAL HISTORY   Social History   Tobacco Use   Smoking status: Former    Current packs/day: 0.00    Average packs/day:  0.3 packs/day for 15.0 years (3.8 ttl pk-yrs)    Types: Cigarettes    Start date: 13    Quit date: 55    Years since quitting: 34.9    Passive exposure: Past   Smokeless tobacco: Never   Tobacco comments:    quit around 1990-1995 (?)  Vaping Use   Vaping status: Never Used  Substance Use Topics   Alcohol use: Yes    Alcohol/week: 14.0 standard drinks of alcohol    Types: 14 Standard drinks or equivalent per week    Comment: 2 scotch shots   Drug use: Never  MEDICATIONS    Home Medication:     Current Medication:  Current Facility-Administered Medications:    acetaminophen (TYLENOL) tablet 650 mg, 650 mg, Oral, Q6H PRN, Schnier, Latina Craver, MD, 650 mg at 05/11/23 2059   albuterol (PROVENTIL) (2.5 MG/3ML) 0.083% nebulizer solution 2.5 mg, 2.5 mg, Nebulization, Q4H PRN, Schnier, Latina Craver, MD, 2.5 mg at 05/12/23 0811   alum & mag hydroxide-simeth (MAALOX/MYLANTA) 200-200-20 MG/5ML suspension 15 mL, 15 mL, Oral, Once, Schnier, Latina Craver, MD   amoxicillin-clavulanate (AUGMENTIN) 500-125 MG per tablet 1 tablet, 1 tablet, Oral, BID, Aleda Grana, RPH, 1 tablet at 05/12/23 8469   apixaban (ELIQUIS) tablet 2.5 mg, 2.5 mg, Oral, BID, Schnier, Latina Craver, MD, 2.5 mg at 05/12/23 6295   aspirin EC tablet 81 mg, 81 mg, Oral, Daily, Schnier, Latina Craver, MD, 81 mg at 05/12/23 2841   atorvastatin (LIPITOR) tablet 40 mg, 40 mg, Oral, Daily, Karna Christmas, Brynna Dobos, MD, 40 mg at 05/12/23 3244   benzonatate (TESSALON) capsule 200 mg, 200 mg, Oral, TID PRN, Vida Rigger, MD, 200 mg at 05/09/23 1703   carvedilol (COREG) tablet 12.5 mg, 12.5 mg, Oral, BID WC, Marrion Coy, MD, 12.5 mg at 05/12/23 0102   Chlorhexidine Gluconate Cloth 2 % PADS 6 each, 6 each, Topical, Daily, Schnier, Latina Craver, MD, 6 each at 05/12/23 0816   ciprofloxacin (CIPRO) tablet 500 mg, 500 mg, Oral, Q breakfast, Ravishankar, Jayashree, MD, 500 mg at 05/12/23 7253   clopidogrel (PLAVIX) tablet 75 mg, 75 mg, Oral, Daily,  Schnier, Latina Craver, MD, 75 mg at 05/12/23 6644   ezetimibe (ZETIA) tablet 10 mg, 10 mg, Oral, Daily, Schnier, Latina Craver, MD, 10 mg at 05/12/23 0347   feeding supplement (ENSURE ENLIVE / ENSURE PLUS) liquid 237 mL, 237 mL, Oral, BID BM, Marrion Coy, MD, 237 mL at 05/12/23 0816   fentaNYL (DURAGESIC) 75 MCG/HR 1 patch, 1 patch, Transdermal, Q72H, Schnier, Latina Craver, MD, 1 patch at 05/11/23 [phone]   oxyCODONE (Oxy IR/ROXICODONE) immediate release tablet 5 mg, 5 mg, Oral, Q6H PRN, Schnier, Latina Craver, MD, 5 mg at 05/04/23 0008   phenylephrine ((USE for PREPARATION-H)) 0.25 % suppository 1 suppository, 1 suppository, Rectal, PRN, Schnier, Latina Craver, MD   pregabalin (LYRICA) capsule 75 mg, 75 mg, Oral, Daily, Marrion Coy, MD, 75 mg at 05/12/23 5638   sodium chloride flush (NS) 0.9 % injection 10-40 mL, 10-40 mL, Intracatheter, Q12H, Schnier, Latina Craver, MD, 10 mL at 05/12/23 0816   sodium chloride flush (NS) 0.9 % injection 10-40 mL, 10-40 mL, Intracatheter, PRN, Schnier, Latina Craver, MD, 10 mL at 05/11/23 2101   traZODone (DESYREL) tablet 50 mg, 50 mg, Oral, QHS, Schnier, Latina Craver, MD, 50 mg at 05/11/23 2057   witch hazel-glycerin (TUCKS) pad, , Topical, PRN, Marrion Coy, MD    ALLERGIES   Patient has no known allergies.     REVIEW OF SYSTEMS    Review of Systems:  Gen:  Denies  fever, sweats, chills weigh loss  HEENT: Denies blurred vision, double vision, ear pain, eye pain, hearing loss, nose bleeds, sore throat Cardiac:  No dizziness, chest pain or heaviness, chest tightness,edema Resp:   reports dyspnea chronically  Gi: Denies swallowing difficulty, stomach pain, nausea or vomiting, diarrhea, constipation, bowel incontinence Gu:  Denies bladder incontinence, burning urine Ext:   Denies Joint pain, stiffness or swelling Skin: Denies  skin rash, easy bruising or bleeding or hives Endoc:  Denies polyuria, polydipsia , polyphagia or weight change Psych:   Denies depression,  insomnia or  hallucinations   Other:  All other systems negative   VS: BP 133/72   Pulse 92   Temp 98.6 F (37 C) (Oral)   Resp 20   Ht 5' 2.99" (1.6 m)   Wt 67.2 kg   SpO2 97%   BMI 26.25 kg/m      PHYSICAL EXAM    GENERAL:NAD, no fevers, chills, no weakness no fatigue HEAD: Normocephalic, atraumatic.  EYES: Pupils equal, round, reactive to light. Extraocular muscles intact. No scleral icterus.  MOUTH: Moist mucosal membrane. Dentition intact. No abscess noted.  EAR, NOSE, THROAT: Clear without exudates. No external lesions.  NECK: Supple. No thyromegaly. No nodules. No JVD.  PULMONARY: decreased breath sounds with mild rhonchi worse at bases bilaterally.  CARDIOVASCULAR: S1 and S2. Regular rate and rhythm. No murmurs, rubs, or gallops. No edema. Pedal pulses 2+ bilaterally.  GASTROINTESTINAL: Soft, nontender, nondistended. No masses. Positive bowel sounds. No hepatosplenomegaly.  MUSCULOSKELETAL: No swelling, clubbing, or edema. Range of motion full in all extremities.  NEUROLOGIC: Cranial nerves II through XII are intact. No gross focal neurological deficits. Sensation intact. Reflexes intact.  SKIN: No ulceration, lesions, rashes, or cyanosis. Skin warm and dry. Turgor intact.  PSYCHIATRIC: Mood, affect within normal limits. The patient is awake, alert and oriented x 3. Insight, judgment intact.       IMAGING     ASSESSMENT/PLAN    Complete atelectasis of left lung- IMPROVED    - patient s/p diuresis with partial improvement however still has minimal lung sounds on left.   - she does have CKD 3 and may not produce adequate fluid removal via medication alone   - we discussed options for thoracentesis and patient would like to proceed with this procedure.  -incentive spirometry at bedside to be used multiple times daily  - PT/OT - etiology is likely poor nutrition post ICU hospitalization with CHF and CKD all contributing to third spaced interstitial edema , peripheral edema  and effusion -repeat thoracentesis 12/15,  have ordered again thoracentesis on right    Bilateral pleural effusions    - s/p throacentesis- multiple times   - consultation for IR with US guided throacentesis - therapeutic    -patient on eliquis -prealbumin -urine protein -holding diuresis today due to [facility] and will continue to monitor renal function.  I anticipate this should improve with normal BP now. -appreciate nephrology consultation  -05/06/23- CXR with reaccumuation of left effusion - treated with thoracentesis 12/17- ordered right thoracentesis  Severe AKI stage 5- RESOLVED     Nephrology on case -appreciate input     - non oliguric at this moment , no HD today per specialist    AF with CAD s/p PCI x 2   - patient on plavix and eliquis     - s/p complicated surgery with multiple transfusions.    -Thrombocytopenia improved    Severe thrombocytopenia   - oncology on case - appreciate input - Dr B   - workup in progress - platelet count trending up   Sepsis due to right groin cellulitis   S/p vascular surgery eval S/p vanco /rocephin  - patient appears to be clinically improved.  - s/p ID revaluation - appreciate input  -E faecalis noted on wound care   Moderate protein calorie malnutrition   - nutritional consultation    - bitemporal wasting and peripheral muscle weakness noted   - contributing to edema/effusion   - complicating tissue healing  -high protein diet --patient  aspirated on evening of 05/07/23  Thank you for allowing me to participate in the care of this patient.  Patient/Family are satisfied with care plan and all questions have been answered.    Provider disclosure: Patient with at least one acute or chronic illness or injury that poses a threat to life or bodily function and is being managed actively during this encounter.  All of the below services have been performed independently by signing provider:  review of prior documentation from  internal and or external health records.  Review of previous and current lab results.  Interview and comprehensive assessment during patient visit today. Review of current and previous chest radiographs/CT scans. Discussion of management and test interpretation with health care team and patient/family.   This document was prepared using Dragon voice recognition software and may include unintentional dictation errors.     Vida Rigger, M.D.  Division of Pulmonary & Critical Care Medicine

## 2023-05-13 ENCOUNTER — Inpatient Hospital Stay: Payer: Medicare Other

## 2023-05-13 DIAGNOSIS — L03314 Cellulitis of groin: Secondary | ICD-10-CM | POA: Diagnosis not present

## 2023-05-13 LAB — BASIC METABOLIC PANEL
Anion gap: 8 (ref 5–15)
BUN: 40 mg/dL — ABNORMAL HIGH (ref 8–23)
CO2: 25 mmol/L (ref 22–32)
Calcium: 8.3 mg/dL — ABNORMAL LOW (ref 8.9–10.3)
Chloride: 109 mmol/L (ref 98–111)
Creatinine, Ser: 1.65 mg/dL — ABNORMAL HIGH (ref 0.44–1.00)
GFR, Estimated: 31 mL/min — ABNORMAL LOW (ref >60)
Glucose, Bld: 95 mg/dL (ref 70–99)
Potassium: 3.9 mmol/L (ref 3.5–5.1)
Sodium: 142 mmol/L (ref 135–145)

## 2023-05-13 LAB — TYPE AND SCREEN
ABO/RH(D): O POS
Antibody Screen: NEGATIVE
Unit division: 0

## 2023-05-13 LAB — CBC
HCT: 30.6 % — ABNORMAL LOW (ref 36.0–46.0)
Hemoglobin: 10 g/dL — ABNORMAL LOW (ref 12.0–15.0)
MCH: 29.2 pg (ref 26.0–34.0)
MCHC: 32.7 g/dL (ref 30.0–36.0)
MCV: 89.5 fL (ref 80.0–100.0)
Platelets: 214 10*3/uL (ref 150–400)
RBC: 3.42 MIL/uL — ABNORMAL LOW (ref 3.87–5.11)
RDW: 16 % — ABNORMAL HIGH (ref 11.5–15.5)
WBC: 6 10*3/uL (ref 4.0–10.5)
nRBC: 0 % (ref 0.0–0.2)

## 2023-05-13 LAB — BPAM RBC
Blood Product Expiration Date: 202501082359
ISSUE DATE / TIME: 202412211740
Unit Type and Rh: 5100

## 2023-05-13 MED ORDER — OXYCODONE HCL 5 MG PO TABS: 5.0000 mg | ORAL_TABLET | Freq: Four times a day (QID) | ORAL | 0 refills | Status: AC | PRN

## 2023-05-13 MED ORDER — ATORVASTATIN CALCIUM 40 MG PO TABS: 40.0000 mg | ORAL_TABLET | Freq: Every day | ORAL | 0 refills | Status: DC

## 2023-05-13 MED ORDER — CIPROFLOXACIN HCL 500 MG PO TABS: 500.0000 mg | ORAL_TABLET | Freq: Every day | ORAL | 0 refills | Status: AC

## 2023-05-13 MED ORDER — AMOXICILLIN-POT CLAVULANATE 500-125 MG PO TABS: 1.0000 | ORAL_TABLET | Freq: Two times a day (BID) | ORAL | 0 refills | Status: AC

## 2023-05-13 NOTE — Plan of Care (Signed)

## 2023-05-13 NOTE — Progress Notes (Signed)
Picc Line removed without difficulties, with 37 cm measured, patient tolerated well. Wound vac switched to portable Prevena wound vac, supplies received by patient and family.

## 2023-05-13 NOTE — Discharge Summary (Signed)
Physician Discharge Summary   Patient: Dawn Bruce MRN: [id number] DOB: 02-15-1946  Admit date:     04/22/2023  Discharge date: 05/13/23  Discharge Physician: Ayvion Kavanagh   PCP: Erasmo Downer, MD   Recommendations at discharge:   Complete antibiotic therapy as recommended Keep scheduled follow-up appointment with infectious disease, pulmonary, nephrology and cardiology   Discharge Diagnoses: Principal Problem:   Cellulitis of right groin Active Problems:   Sepsis (HCC)   CAD (coronary artery disease)   Peritoneal hematoma   Essential hypertension   Chronic diastolic heart failure (HCC)   Hypokalemia   Chronic kidney disease, stage 3a (HCC)   Pleural effusion   Abnormal LFTs   Atrial fibrillation, chronic (HCC)   Chronic pain syndrome   HLD (hyperlipidemia)   Infected wound   Acute kidney injury superimposed on stage 3a chronic kidney disease (HCC)   Metabolic acidosis   Thrombocytopenia (HCC)   S/P thoracentesis  Resolved Problems:   * No resolved hospital problems. [facility] Course:  Dawn Bruce is a 77 y.o. female with medical history significant of dCHF, CAD (s/p of recent DES), A fib on Eliquis, HTN, HLD, CDK-3a, chronic pain syndrome, multiple skin cancer, perforated appendicitis, former smoker, who presents with right groin pain.  Patient was recently hospitalized from 11/21 - 11/29 due to non-STEMI. Patient is s/p LHC with DES placement. Pt developed retroperitoneal hematoma & hemorrhagic shock due to complication of Iliac artery laceration during left heart cath of right groin. Pt underwent surgical repair of external iliac artery by VVS on 11/23.  Presented back to ED 12/1 due to increased redness, swelling, and pain at incision site of the cath. Failed outpatient Abx x1 dose.  Hypotensive to 80/50 on presentation. 12/2: pulmonology, and IR consulted to address pleural effusion. ID consulted to address abx course. Transitioned to linezolid. Vascular  evaluated and recommended debridement. 12/3: Thoracentesis removed 1.6L left side 12/4:stable ORA, afebrile. Wound clinically greatly improved. Debridement today. Plan to stop heparin gtt and return to eliquis + DAPT s/p procedure 12/6: additional debridement.  Debridement again 12/10. Now on zosyn. Doing well. Respiratory status stable ORA.  12/12.  Patient had a worsening renal function, nephrology consult obtained. 12/14.  Worsening thrombocytopenia, consult hematology oncology to rule out TTS. 12/15.  Thoracentesis removed 1.5 Lon left 12/18 . Improved throbocytopenia 12/20  For thoracentesis today 12/21.  Status post thoracentesis and transfusion of 1 unit of packed RBC.       Assessment and Plan:  Acute renal failure on chronic kidney disease stage IIIa. Hypokalemia. Mild metabolic acidosis. Transient hypotension. Renal function continues to improve and at discharge serum creatinine is 1.65 Serum creatinine had peaked at 4.67 and continues to show a downward trend Blood pressure is stable Appreciate nephrology input. Continue to hold losartan Follow-up with nephrology as an outpatient     Sepsis due to cellulitis of right groin Right groin wound infection secondary to surgical repair of pseudoaneurysm. Sepsis criteria met on admission with WBC 21.3, heart rate 104, RR 29.  Lactic acid normal 1.7.   Sepsis appears to be secondary to right groin surgical wound infection.  Patient had debridement x 2, wound-vac in place Wound culture grow E. coli, Morganella morganii, Proteus Morales, Enterococcus faecalis.  Blood culture negative.  Initially treated with linezolid + cefepime, switched to Zosyn on 12/11.  Dressing change performed by vascular surgery 12/12, wound is getting better with significant granulation. Discussed with vascular surgery and ID, will continue IV antibiotics while  in the hospital.   Due to severe thrombocytopenia, most likely caused by Zosyn, antibiotic  switched to Cipro and daptomycin per ID.(Continue antibiotic therapy through 05/20/2023) Patient had a dressing changes performed 12/19, wound healing well. 12/19.   Discussed with ID about discharge plans and patient switched to Augmentin and ciprofloxacin to complete on 05/24/23.  Will need to monitor platelet count over 48 hours and if stable will discharge home on oral antibiotics. Will need to start back daptomycin if thrombocytopenia reoccurs. Will need home health PT/OT and RN on discharge Home health RN for wound VAC dressing changes on Mondays and Thursdays      Hypervolemia  Bilateral Pleural effusions Acute on chronic systolic congestive heart failure.  Hx of CAD: s/p recent DES. Last known LVEF of 25 to 30% from a 2D echocardiogram which was done 11/24 Patient initially received IV Lasix, also had a thoracentesis x 2 Volume status has improved. Discussed with cardiology, will continue Plavix, not able to discontinue due to recent drug-eluting stent. Had several thoracentesis done during this hospitalization.   Follow-up with pulmonary as an outpatient,         Peritoneal hematoma Anemia acute on chronic. Thrombocytopenia. Received a total of 3 units of packed RBC during this hospitalization Normal iron, B12 level borderline, check homocystine level normal.  Hemoglobin much better. Patient developed mild thrombocytopenia of 117 on 12/12, not currently on heparin.   Noted to have worsening thrombocytopenia with the lowest platelet count of 39. Patient was seen by hematology interval cytopenia attributed to Zosyn.  No evidence of TTS.  No active bleeding.  Zosyn discontinued. Platelet count has normalized     Choking episode. Patient had a choking episode the night of 12/16, appears to have improved  Patient does not have a baseline dysphagia.  Continue to follow.     Essential hypertension: Continue carvedilol  Hold ARB due to acute renal failure.     Atrial  fibrillation, chronic (HCC): rate controlled On eliquis Continue carvedilol   Diarrhea: C. difficile negative. Diarrhea improved after giving Imodium.   Chronic pain syndrome -Continue home fentanyl patch and Lyrica   HLD: -lipitor and zetia             Consultants: Nephrology, cardiology, pulmonary, infectious disease, vascular surgery Procedures performed: Thoracentesis, wound VAC placement Disposition: Home health Diet recommendation:  Discharge Diet Orders (From admission, onward)     Start     Ordered   05/13/23 0000  Diet - low sodium heart healthy        05/13/23 1113           Cardiac diet DISCHARGE MEDICATION: Allergies as of 05/13/2023   No Known Allergies      Medication List     STOP taking these medications    aspirin 81 MG chewable tablet   cephALEXin 500 MG capsule Commonly known as: KEFLEX   famotidine 40 MG tablet Commonly known as: PEPCID   valsartan 160 MG tablet Commonly known as: DIOVAN       TAKE these medications    amoxicillin-clavulanate 500-125 MG tablet Commonly known as: AUGMENTIN Take 1 tablet by mouth 2 (two) times daily for 12 days.   atorvastatin 40 MG tablet Commonly known as: LIPITOR Take 1 tablet (40 mg total) by mouth daily. Start taking on: May 14, 2023 What changed:  medication strength how much to take   carvedilol 12.5 MG tablet Commonly known as: COREG Take 12.5 mg by mouth 2 (two) times  daily with a meal.   ciprofloxacin 500 MG tablet Commonly known as: CIPRO Take 1 tablet (500 mg total) by mouth daily with breakfast for 12 days. Start taking on: May 14, 2023   clopidogrel 75 MG tablet Commonly known as: PLAVIX Take 1 tablet (75 mg total) by mouth daily.   Eliquis 2.5 MG Tabs tablet Generic drug: apixaban Take 2.5 mg by mouth 2 (two) times daily.   ezetimibe 10 MG tablet Commonly known as: ZETIA TAKE 1 TABLET(10 MG) BY MOUTH DAILY   fentaNYL 25 MCG/HR Commonly known  as: DURAGESIC Place 2 patches onto the skin every 3 (three) days.   isosorbide mononitrate 60 MG 24 hr tablet Commonly known as: IMDUR Take 1 tablet (60 mg total) by mouth daily.   oxyCODONE 5 MG immediate release tablet Commonly known as: Oxy IR/ROXICODONE Take 1 tablet (5 mg total) by mouth every 6 (six) hours as needed for up to 5 days for moderate pain (pain score 4-6).   pantoprazole 40 MG tablet Commonly known as: PROTONIX Take 1 tablet (40 mg total) by mouth daily.   pregabalin 150 MG capsule Commonly known as: LYRICA Take 1 capsule (150 mg total) by mouth 2 (two) times daily.   shark liver oil-cocoa butter 0.25-3-85.5 % suppository Commonly known as: PREPARATION H Place 1 suppository rectally as needed for hemorrhoids.   traZODone 50 MG tablet Commonly known as: DESYREL Take 0.5-1 tablets (25-50 mg total) by mouth at bedtime as needed for sleep.               Durable Medical Equipment  (From admission, onward)           Start     Ordered   05/12/23 1148  For home use only DME oxygen  Once       Question Answer Comment  Length of Need 6 Months   Mode or (Route) Nasal cannula   Oxygen delivery system Gas      05/12/23 1148              Discharge Care Instructions  (From admission, onward)           Start     Ordered   05/13/23 0000  Discharge wound care:       Comments: Wound VAC dressing changes on Monday and Thursday per home health RN   05/13/23 1113            Follow-up Information     Georgiana Spinner, NP Follow up in 2 week(s).   Specialty: Vascular Surgery Why: Wound vac check and change Contact information: [address] [place] [address] [phone]         Lynn Ito, MD Follow up on 05/24/2023.   Specialty: Infectious Diseases Contact information: [address] [place] [address] [phone]                Discharge Exam: Ceasar Mons Weights   05/11/23 0427  05/12/23 0416 05/13/23 0400  Weight: 66.3 kg 67.2 kg 66 kg   General exam: Appears calm and comfortable  Respiratory system: Bilateral air entry, respiratory effort normal. Cardiovascular system: Irregularly irregular. No JVD, murmurs, rubs, gallops or clicks. No pedal edema. Gastrointestinal system: Abdomen is nondistended, soft and nontender. No organomegaly or masses felt. Normal bowel sounds heard. Central nervous system: Alert and oriented. No focal neurological deficits. Extremities: Symmetric 5 x 5 power. Skin: No rashes, lesions or ulcers, wound VAC in place in right groin  Psychiatry: Judgement and insight appear normal. Mood & affect appropriate.    Condition at discharge: stable  The results of significant diagnostics from this hospitalization (including imaging, microbiology, ancillary and laboratory) are listed below for reference.   Imaging Studies: DG Chest Port 1 View Result Date: 05/13/2023 CLINICAL DATA:  Insert x-ray EXAM: PORTABLE CHEST 1 VIEW COMPARISON:  Chest x-ray 05/11/2023, CT chest 04/12/2023 FINDINGS: Right PICC with tip overlying the superior cavoatrial junction. The heart and mediastinal contours are within normal limits. No focal consolidation. No pulmonary edema. Interval increase in moderate left pleural effusion. Trace right pleural effusion. No pneumothorax. No acute osseous abnormality. IMPRESSION: 1.  Interval increase in moderate left pleural effusion. 2. Trace right pleural effusion. Electronically Signed   By: Tish Frederickson M.D.   On: 05/13/2023 10:18   DG Chest Port 1 View Result Date: 05/11/2023 CLINICAL DATA:  Pleural effusion status post thoracentesis. EXAM: PORTABLE CHEST 1 VIEW COMPARISON:  Chest radiograph dated 05/11/2023. FINDINGS: Small bilateral pleural effusions. Interval decrease in the size of left pleural effusion since the prior radiograph. No obvious pneumothorax. There are bibasilar atelectasis or infiltrate. Right-sided PICC in  similar position. Stable cardiac silhouette. No acute osseous pathology. IMPRESSION: Small bilateral pleural effusions. Interval decrease in the size of left pleural effusion. No obvious pneumothorax. Electronically Signed   By: Elgie Collard M.D.   On: 05/11/2023 18:02   US THORACENTESIS ASP PLEURAL SPACE W/IMG GUIDE Result Date: 05/11/2023 INDICATION: Patient with congestive heart failure, bilateral pleural effusions, left greater than right. Request for left-sided therapeutic thoracentesis. EXAM: ULTRASOUND GUIDED THERAPEUTIC THORACENTESIS MEDICATIONS: 8 cc of 1% lidocaine COMPLICATIONS: None immediate. PROCEDURE: An ultrasound guided thoracentesis was thoroughly discussed with the patient and questions answered. The benefits, risks, alternatives and complications were also discussed. The patient understands and wishes to proceed with the procedure. Written consent was obtained. Ultrasound was performed to localize and mark an adequate pocket of fluid in the left chest. The area was then prepped and draped in the normal sterile fashion. 1% Lidocaine was used for local anesthesia. Under ultrasound guidance a 6 Fr Safe-T-Centesis catheter was introduced. Thoracentesis was performed. The catheter was removed and a dressing applied. FINDINGS: A total of approximately 1.4 L of dark red pleural fluid was removed. IMPRESSION: Successful ultrasound guided left thoracentesis yielding 1.4 L of pleural fluid. Performed by Buzzy Han, PA-C Electronically Signed   By: Acquanetta Belling M.D.   On: 05/11/2023 16:41   DG Chest Port 1 View Result Date: 05/11/2023 CLINICAL DATA:  Follow-up left pleural effusion. EXAM: PORTABLE CHEST 1 VIEW COMPARISON:  05/08/2023 FINDINGS: There is a right arm PICC line with tip in the projection of the superior cavoatrial junction. Cardiomediastinal contours are unchanged. New small right pleural effusion with overlying atelectasis. Interval increase opacification within the left mid and  left lower lung which may reflect progressive pleural effusion, atelectasis, and or airspace disease. IMPRESSION: 1. New small right pleural effusion with overlying atelectasis. 2. Interval increase opacification within the left mid and left lower lung which may reflect progressive pleural effusion, atelectasis, and/or airspace disease. Electronically Signed   By: Signa Kell M.D.   On: 05/11/2023 11:25   DG Chest Port 1 View Result Date: 05/08/2023 CLINICAL DATA:  Status post right thoracentesis. EXAM: PORTABLE CHEST 1 VIEW COMPARISON:  05/07/2023 FINDINGS: Improved aeration at the right lung base compatible with recent thoracentesis. Negative for a pneumothorax. Markedly increased densities at the left lung base compared to 05/07/2023 raises concern for re-accumulation  of left pleural fluid although cannot exclude airspace disease or volume loss in this area. Heart size is within normal limits and stable. Right arm PICC line tip is in the SVC region. IMPRESSION: 1. Improved aeration at the right lung base compatible with recent thoracentesis. Negative for a pneumothorax. 2. Increased densities at the left lung base. Findings likely represent re-accumulation of pleural fluid and atelectasis at the left lung base. Electronically Signed   By: Richarda Overlie M.D.   On: 05/08/2023 13:21   US THORACENTESIS ASP PLEURAL SPACE W/IMG GUIDE Result Date: 05/08/2023 INDICATION: Patient with congestive heart failure, with a right-sided pleural effusion. Request for therapeutic thoracentesis. EXAM: ULTRASOUND GUIDED THERAPEUTIC THORACENTESIS MEDICATIONS: 8 ml of 1% lidocaine COMPLICATIONS: None immediate. PROCEDURE: An ultrasound guided thoracentesis was thoroughly discussed with the patient and questions answered. The benefits, risks, alternatives and complications were also discussed. The patient understands and wishes to proceed with the procedure. Written consent was obtained. Ultrasound was performed to localize and  mark an adequate pocket of fluid in the right chest. The area was then prepped and draped in the normal sterile fashion. 1% Lidocaine was used for local anesthesia. Under ultrasound guidance a 6 Fr Safe-T-Centesis catheter was introduced. Thoracentesis was performed. The catheter was removed and a dressing applied. FINDINGS: A total of approximately 550 cc of dark amber pleural fluid was removed. IMPRESSION: Successful ultrasound guided right thoracentesis yielding 550 cc of pleural fluid. Performed by Buzzy Han, PA-C Electronically Signed   By: Richarda Overlie M.D.   On: 05/08/2023 13:15   DG Chest Port 1 View Result Date: 05/07/2023 CLINICAL DATA:  Post thoracentesis EXAM: PORTABLE CHEST 1 VIEW COMPARISON:  05/06/2023 FINDINGS: Decreased left pleural effusion post thoracentesis. No definitive pneumothorax. Small residual left effusion. Increased now moderate appearing right pleural effusion. Stable cardiomediastinal silhouette with aortic atherosclerosis and vascular congestion. Right upper extremity central venous catheter tip at the SVC. IMPRESSION: 1. Decreased left pleural effusion post thoracentesis. No definitive pneumothorax. 2. Increased now moderate appearing right pleural effusion. Small residual left effusion Electronically Signed   By: Jasmine Pang M.D.   On: 05/07/2023 18:01   US THORACENTESIS ASP PLEURAL SPACE W/IMG GUIDE Result Date: 05/07/2023 INDICATION: Patient with congestive heart failure, with left pleural effusion. Request for therapeutic thoracentesis. EXAM: ULTRASOUND GUIDED THERAPEUTIC THORACENTESIS MEDICATIONS: 9 cc of 1% lidocaine COMPLICATIONS: None immediate. PROCEDURE: An ultrasound guided thoracentesis was thoroughly discussed with the patient and questions answered. The benefits, risks, alternatives and complications were also discussed. The patient understands and wishes to proceed with the procedure. Written consent was obtained. Ultrasound was performed to localize and  mark an adequate pocket of fluid in the left chest. The area was then prepped and draped in the normal sterile fashion. 1% Lidocaine was used for local anesthesia. Under ultrasound guidance a 6 Fr Safe-T-Centesis catheter was introduced. Thoracentesis was performed. The catheter was removed and a dressing applied. FINDINGS: A total of approximately 1.5 L of straw-colored pleural fluid was removed. IMPRESSION: Successful ultrasound guided left thoracentesis yielding 1.5 L of pleural fluid. Performed by Buzzy Han, PA-C Electronically Signed   By: Judie Petit.  Shick M.D.   On: 05/07/2023 16:28   DG Chest Port 1 View Result Date: 05/06/2023 CLINICAL DATA:  [id number] Interstitial edema [id number]. EXAM: PORTABLE CHEST 1 VIEW COMPARISON:  Chest radiograph 05/03/2023. FINDINGS: Unchanged right upper extremity PICC with tip projecting over the low SVC. Similar moderate left pleural effusion and trace right pleural effusion with adjacent atelectasis in the lung  bases. No pulmonary edema. Stable cardiac and mediastinal contours. No pneumothorax. IMPRESSION: Similar moderate left pleural effusion and trace right pleural effusion with adjacent atelectasis in the lung bases. Electronically Signed   By: Orvan Falconer M.D.   On: 05/06/2023 16:16   DG Chest Port 1 View Result Date: 05/03/2023 CLINICAL DATA:  Interstitial edema, hypotension EXAM: PORTABLE CHEST 1 VIEW COMPARISON:  05/01/2023 FINDINGS: Patchy left lower lobe opacity, favoring atelectasis. Small bilateral pleural effusions, left greater than right. This appearance is unchanged from recent prior. No frank interstitial edema.  No pneumothorax. Heart is normal in size Right arm PICC terminates in the lower SVC. IMPRESSION: Patchy left lower lobe opacity, favoring atelectasis. Small bilateral pleural effusions, left greater than right. No frank interstitial edema. Electronically Signed   By: Charline Bills M.D.   On: 05/03/2023 20:15   DG Chest Port 1 View Result  Date: 05/01/2023 CLINICAL DATA:  Pleural effusion EXAM: PORTABLE CHEST 1 VIEW COMPARISON:  04/29/2023 FINDINGS: Small bilateral pleural effusions, left greater than right. Associated lower lobe opacities, likely atelectasis. No pneumothorax. The heart is normal in size. Right arm PICC terminating at the cavoatrial junction. IMPRESSION: Small bilateral pleural effusions, left greater than right. Associated lower lobe opacities, likely atelectasis. Electronically Signed   By: Charline Bills M.D.   On: 05/01/2023 17:53   DG Chest Port 1 View Result Date: 04/29/2023 CLINICAL DATA:  Short of breath EXAM: PORTABLE CHEST 1 VIEW COMPARISON:  04/24/2023 FINDINGS: Single frontal view of the chest demonstrates right-sided PICC tip overlying superior vena cava. There are increased bibasilar veiling opacities, which obscure the cardiac silhouette. No pneumothorax. No acute bony abnormalities. IMPRESSION: 1. Worsening bibasilar veiling opacities, consistent with enlarging pleural effusions and progressive basilar consolidation. Electronically Signed   By: Sharlet Salina M.D.   On: 04/29/2023 17:32   DG Chest Port 1 View Result Date: 04/24/2023 CLINICAL DATA:  Status post left thoracentesis. EXAM: PORTABLE CHEST 1 VIEW COMPARISON:  April 22, 2023. FINDINGS: The heart size and mediastinal contours are within normal limits. Minimal bibasilar subsegmental atelectasis is noted with small pleural effusions. Right-sided PICC line is noted with distal tip in expected position of the SVC. The visualized skeletal structures are unremarkable. IMPRESSION: Minimal bibasilar subsegmental atelectasis with small pleural effusions. Electronically Signed   By: Lupita Raider M.D.   On: 04/24/2023 16:59   US THORACENTESIS ASP PLEURAL SPACE W/IMG GUIDE Result Date: 04/24/2023 INDICATION: Congestive heart failure with left pleural effusion. Request for therapeutic thoracentesis. EXAM: ULTRASOUND GUIDED LEFT THORACENTESIS MEDICATIONS:  1% lidocaine 10 mL COMPLICATIONS: None immediate. PROCEDURE: An ultrasound guided thoracentesis was thoroughly discussed with the patient and questions answered. The benefits, risks, alternatives and complications were also discussed. The patient understands and wishes to proceed with the procedure. Written consent was obtained. Ultrasound was performed to localize and mark an adequate pocket of fluid in the left chest. The area was then prepped and draped in the normal sterile fashion. 1% Lidocaine was used for local anesthesia. Under ultrasound guidance a 6 Fr Safe-T-Centesis catheter was introduced. Thoracentesis was performed. The catheter was removed and a dressing applied. FINDINGS: A total of approximately 1.6 L of dark amber fluid was removed. IMPRESSION: Successful ultrasound guided left thoracentesis yielding 1.6 L of pleural fluid. No pneumothorax on post-procedure chest x-ray. Procedure performed by: Corrin Parker, PA-C Electronically Signed   By: Gilmer Mor D.O.   On: 04/24/2023 16:45   [place] EKG SITE [facility] Result Date: 04/23/2023 If Site [facility]  not attached, placement could not be confirmed due to current cardiac rhythm.  CT ABDOMEN PELVIS W CONTRAST Result Date: 04/22/2023 CLINICAL DATA:  Right lower quadrant abdominal pain. EXAM: CT ABDOMEN AND PELVIS WITH CONTRAST TECHNIQUE: Multidetector CT imaging of the abdomen and pelvis was performed using the standard protocol following bolus administration of intravenous contrast. RADIATION DOSE REDUCTION: This exam was performed according to the departmental dose-optimization program which includes automated exposure control, adjustment of the mA and/or kV according to patient size and/or use of iterative reconstruction technique. CONTRAST:  75mL OMNIPAQUE IOHEXOL 300 MG/ML  SOLN COMPARISON:  April 13, 2023. FINDINGS: Lower chest: Large left pleural effusion is noted with small right pleural effusion, with adjacent atelectasis of the lower lobes.  Hepatobiliary: No focal liver abnormality is seen. No gallstones, gallbladder wall thickening, or biliary dilatation. Pancreas: Unremarkable. No pancreatic ductal dilatation or surrounding inflammatory changes. Spleen: Normal in size without focal abnormality. Adrenals/Urinary Tract: Adrenal glands and kidneys appear normal. No hydronephrosis or renal obstruction is noted. Urinary bladder is displaced to the left by moderate size right-sided pelvic hematoma which appears to be slightly enlarged compared to prior exam. Stomach/Bowel: The stomach is unremarkable. There is no evidence of bowel obstruction or inflammation. Status post appendectomy. Vascular/Lymphatic: Aortic atherosclerosis. No enlarged abdominal or pelvic lymph nodes. Reproductive: Status post hysterectomy. No adnexal masses. Other: As noted above, moderate size right-sided retroperitoneal hematoma is noted which extends into right pelvic region, where it is mildly enlarged. Subcutaneous gas is noted in the right lower quadrant suggesting postsurgical change. Musculoskeletal: No acute or significant osseous findings. IMPRESSION: Moderate sized right retroperitoneal hematoma is noted which extends into right pelvic region and displaces urinary bladder to the left. The pelvic component appears to be mildly enlarged compared to prior exam. Bilateral pleural effusions are noted, left greater than right, with associated atelectasis of the lower lobes. Electronically Signed   By: Lupita Raider M.D.   On: 04/22/2023 17:45   DG Chest Port 1 View Result Date: 04/22/2023 CLINICAL DATA:  Possible wound infection.  Hypotension. EXAM: PORTABLE CHEST 1 VIEW COMPARISON:  April 16, 2023. FINDINGS: Interval development of moderate size left pleural effusion with probable underlying atelectasis or pneumonia. Right lung is clear. IMPRESSION: Interval development of moderate size left pleural effusion with probable underlying atelectasis or infiltrate.  Electronically Signed   By: Lupita Raider M.D.   On: 04/22/2023 16:49   US Venous Img Lower Unilateral Right (DVT) Result Date: 04/18/2023 CLINICAL DATA:  Lower extremity edema EXAM: RIGHT LOWER EXTREMITY VENOUS DOPPLER ULTRASOUND TECHNIQUE: Gray-scale sonography with compression, as well as color and duplex ultrasound, were performed to evaluate the deep venous system(s) from the level of the common femoral vein through the popliteal and proximal calf veins. COMPARISON:  None Available. FINDINGS: VENOUS The common femoral vein and central profundus femoral vein is obscured by overlying medical device. Normal compressibility of the visualized femoral and popliteal veins, as well as the visualized calf veins. No filling defects to suggest DVT on grayscale or color Doppler imaging. Doppler waveforms show normal direction of venous flow, normal respiratory plasticity and response to augmentation. Limited views of the contralateral common femoral vein are unremarkable. OTHER 4.4 cm fluid collection within the popliteal fossa noted in keeping with a Baker cyst. Limitations: none IMPRESSION: 1. Limited evaluation of the right common femoral and central profundus femoral veins. No evidence of femoropopliteal DVT or calf DVT within the visualized right lower extremity. 2. 4.4 cm Baker cyst. Electronically  Signed   By: Helyn Numbers M.D.   On: 04/18/2023 20:55   US Venous Img Upper Uni Right(DVT) Result Date: 04/16/2023 CLINICAL DATA:  Right upper extremity swelling for 3 days EXAM: RIGHT UPPER EXTREMITY VENOUS DOPPLER ULTRASOUND TECHNIQUE: Gray-scale sonography with graded compression, as well as color Doppler and duplex ultrasound were performed to evaluate the upper extremity deep venous system from the level of the subclavian vein and including the jugular, axillary, basilic, radial, ulnar and upper cephalic vein. Spectral Doppler was utilized to evaluate flow at rest and with distal augmentation maneuvers.  COMPARISON:  None Available. FINDINGS: Contralateral Subclavian Vein: Respiratory phasicity is normal and symmetric with the symptomatic side. No evidence of thrombus. Normal compressibility. Internal Jugular Vein: No evidence of thrombus. Normal compressibility, respiratory phasicity and response to augmentation. Subclavian Vein: No evidence of thrombus. Normal compressibility, respiratory phasicity and response to augmentation. Axillary Vein: No evidence of thrombus. Normal compressibility, respiratory phasicity and response to augmentation. Cephalic Vein: Occlusive superficial thrombophlebitis of the mid to distal right cephalic vein. Proximal right cephalic vein is patent and compressible and without thrombus. Basilic Vein: No evidence of thrombus. Normal compressibility, respiratory phasicity and response to augmentation. Brachial Veins: No evidence of thrombus. Normal compressibility, respiratory phasicity and response to augmentation. Radial Veins: No evidence of thrombus. Normal compressibility, respiratory phasicity and response to augmentation. Ulnar Veins: No evidence of thrombus. Normal compressibility, respiratory phasicity and response to augmentation. Venous Reflux:  None visualized. Other Findings: Right internal jugular central venous catheter seen within the right internal jugular vein without surrounding thrombus. IMPRESSION: 1. Occlusive superficial thrombophlebitis of the mid to distal right cephalic vein. 2. No evidence of deep venous thrombosis in the right upper extremity. Electronically Signed   By: Delbert Phenix M.D.   On: 04/16/2023 13:47   DG Chest Port 1 View Result Date: 04/16/2023 CLINICAL DATA:  77 year old female with history of pleural effusions. EXAM: PORTABLE CHEST 1 VIEW COMPARISON:  Chest x-ray 04/15/2023. FINDINGS: Patient has been extubated. Nasogastric tube seen on the prior study has been removed. Right internal jugular central venous catheter with tip terminating at the  superior cavoatrial junction. Opacity at the left base which may reflect atelectasis and/or consolidation. Small left pleural effusion. Right lung appears clear. No definite right pleural effusion. No pneumothorax. No evidence of pulmonary edema. Heart size is normal. Upper mediastinal contours are within normal limits. IMPRESSION: 1. Support apparatus, as above. 2. Atelectasis and/or consolidation in the left lung base with small left pleural effusion. Electronically Signed   By: Trudie Reed M.D.   On: 04/16/2023 06:56   US RENAL Result Date: 04/15/2023 CLINICAL DATA:  Acute renal failure. EXAM: RENAL / URINARY TRACT ULTRASOUND COMPLETE COMPARISON:  CT abdomen pelvis dated April 13, 2023. FINDINGS: Right Kidney: Renal measurements: 7.5 x 4.3 x 4.1 cm = volume: 69 mL. Echogenicity within normal limits. No mass or hydronephrosis visualized. Left Kidney: Renal measurements: 7.7 x 4.2 x 3.6 cm = volume: 61 mL. Echogenicity within normal limits. No mass or hydronephrosis visualized. Bladder: Decompressed by Foley catheter. Other: Bilateral pleural effusions. Small amount of fluid in the upper abdomen. IMPRESSION: 1. No acute abnormality.  No hydronephrosis. Electronically Signed   By: Obie Dredge M.D.   On: 04/15/2023 18:02   DG Abd 1 View Result Date: 04/15/2023 CLINICAL DATA:  Right retroperitoneal hematoma. EXAM: ABDOMEN - 1 VIEW COMPARISON:  04/14/2023 FINDINGS: No gaseous bowel dilatation. Foley catheter noted in the urinary bladder. Persistent contrast retention in the kidneys.  Skin staples overlie the right groin region. IMPRESSION: 1. Nonobstructive bowel gas pattern. 2. Persistent contrast retention in the kidneys. Electronically Signed   By: Kennith Center M.D.   On: 04/15/2023 14:11   DG Chest Port 1 View Result Date: 04/15/2023 CLINICAL DATA:  Endotracheal tube positioning/placement. EXAM: PORTABLE CHEST 1 VIEW COMPARISON:  Chest radiograph dated 04/14/2023. FINDINGS: The heart size  and mediastinal contours are within normal limits. There is mild bibasilar atelectasis/airspace disease. No pneumothorax. An endotracheal tube terminates in the midthoracic trachea. An enteric tube enters the stomach and terminates below the field of view. IMPRESSION: Mild bibasilar atelectasis/airspace disease. Electronically Signed   By: Romona Curls M.D.   On: 04/15/2023 13:56   ECHOCARDIOGRAM COMPLETE BUBBLE STUDY Result Date: 04/14/2023    ECHOCARDIOGRAM REPORT   Patient Name:   AMISHA MERRYMAN Date of Exam: 04/14/2023 Medical Rec #:  [id number]   Height:       63.0 in Accession #:    [id number]  Weight:       128.0 lb Date of Birth:  Aug 05, 1945  BSA:          1.600 m Patient Age:    30 years    BP:           92/63 mmHg Patient Gender: F           HR:           86 bpm. Exam Location:  [facility] Procedure: 2D Echo, Cardiac Doppler, Color Doppler and Saline Contrast Bubble            Study Indications:     CHF (congestive heart failure) (HCC) [[id number]]  History:         Patient has prior history of Echocardiogram examinations. Risk                  Factors:Hypertension.  Sonographer:     Neysa Bonito Roar Referring Phys:  [id number] Dorian Pod HUDSON Diagnosing Phys: Marcina Millard MD IMPRESSIONS  1. Left ventricular ejection fraction, by estimation, is 25 to 30%. The left ventricle has severely decreased function. The left ventricle demonstrates regional wall motion abnormalities (see scoring diagram/findings for description). Left ventricular diastolic parameters were normal.  2. Right ventricular systolic function is normal. The right ventricular size is normal.  3. The mitral valve is normal in structure. Mild mitral valve regurgitation. No evidence of mitral stenosis.  4. The aortic valve is normal in structure. Aortic valve regurgitation is not visualized. No aortic stenosis is present.  5. The inferior vena cava is normal in size with greater than 50% respiratory variability, suggesting right atrial pressure of 3  mmHg. FINDINGS  Left Ventricle: Left ventricular ejection fraction, by estimation, is 25 to 30%. The left ventricle has severely decreased function. The left ventricle demonstrates regional wall motion abnormalities. The left ventricular internal cavity size was normal  in size. There is no left ventricular hypertrophy. Left ventricular diastolic parameters were normal.  LV Wall Scoring: The apical lateral segment, apical septal segment, and apex are akinetic. The apical anterior segment and apical inferior segment are hypokinetic. Right Ventricle: The right ventricular size is normal. No increase in right ventricular wall thickness. Right ventricular systolic function is normal. Left Atrium: Left atrial size was normal in size. Right Atrium: Right atrial size was normal in size. Pericardium: Trivial pericardial effusion is present. Mitral Valve: The mitral valve is normal in structure. Mild mitral valve regurgitation. No evidence of mitral valve stenosis. MV peak gradient, 5.5 mmHg.  The mean mitral valve gradient is 3.0 mmHg. Tricuspid Valve: The tricuspid valve is normal in structure. Tricuspid valve regurgitation is not demonstrated. No evidence of tricuspid stenosis. Aortic Valve: The aortic valve is normal in structure. Aortic valve regurgitation is not visualized. No aortic stenosis is present. Aortic valve mean gradient measures 3.0 mmHg. Aortic valve peak gradient measures 4.6 mmHg. Aortic valve area, by VTI measures 1.14 cm. Pulmonic Valve: The pulmonic valve was normal in structure. Pulmonic valve regurgitation is not visualized. No evidence of pulmonic stenosis. Aorta: The aortic root is normal in size and structure. Venous: The inferior vena cava is normal in size with greater than 50% respiratory variability, suggesting right atrial pressure of 3 mmHg. IAS/Shunts: No atrial level shunt detected by color flow Doppler. Agitated saline contrast was given intravenously to evaluate for intracardiac  shunting. Additional Comments: There is a small pleural effusion in the left lateral region.  LEFT VENTRICLE PLAX 2D LVIDd:         3.90 cm     Diastology LVIDs:         3.40 cm     LV e' medial:    2.84 cm/s LV PW:         1.00 cm     LV E/e' medial:  28.2 LV IVS:        1.10 cm     LV e' lateral:   4.95 cm/s LVOT diam:     1.40 cm     LV E/e' lateral: 16.2 LV SV:         23 LV SV Index:   14 LVOT Area:     1.54 cm  LV Volumes (MOD) LV vol d, MOD A2C: 48.3 ml LV vol d, MOD A4C: 39.9 ml LV vol s, MOD A2C: 35.0 ml LV vol s, MOD A4C: 28.3 ml LV SV MOD A2C:     13.3 ml LV SV MOD A4C:     39.9 ml LV SV MOD BP:      12.4 ml RIGHT VENTRICLE RV Basal diam:  2.40 cm RV Mid diam:    2.20 cm RV S prime:     6.97 cm/s TAPSE (M-mode): 0.9 cm LEFT ATRIUM             Index        RIGHT ATRIUM          Index LA diam:        3.10 cm 1.94 cm/m   RA Area:     7.55 cm LA Vol (A2C):   39.3 ml 24.57 ml/m  RA Volume:   12.20 ml 7.63 ml/m LA Vol (A4C):   31.6 ml 19.76 ml/m LA Biplane Vol: 35.1 ml 21.94 ml/m  AORTIC VALVE                    PULMONIC VALVE AV Area (Vmax):    1.34 cm     PV Vmax:        0.85 m/s AV Area (Vmean):   1.26 cm     PV Peak grad:   2.9 mmHg AV Area (VTI):     1.14 cm     RVOT Peak grad: 2 mmHg AV Vmax:           107.00 cm/s AV Vmean:          74.200 cm/s AV VTI:            0.203 m AV Peak Grad:  4.6 mmHg AV Mean Grad:      3.0 mmHg LVOT Vmax:         93.40 cm/s LVOT Vmean:        60.600 cm/s LVOT VTI:          0.150 m LVOT/AV VTI ratio: 0.74  AORTA Ao Root diam: 1.90 cm Ao Asc diam:  2.30 cm MITRAL VALVE                TRICUSPID VALVE MV Area (PHT): 5.84 cm     TR Peak grad:   23.6 mmHg MV Area VTI:   0.95 cm     TR Vmax:        243.00 cm/s MV Peak grad:  5.5 mmHg MV Mean grad:  3.0 mmHg     SHUNTS MV Vmax:       1.17 m/s     Systemic VTI:  0.15 m MV Vmean:      80.4 cm/s    Systemic Diam: 1.40 cm MV Decel Time: 130 msec MV E velocity: 80.20 cm/s MV A velocity: 105.00 cm/s MV E/A ratio:  0.76 MV A  Prime:    7.1 cm/s Marcina Millard MD Electronically signed by Marcina Millard MD Signature Date/Time: 04/14/2023/2:42:41 PM    Final    DG Abd 1 View Result Date: 04/14/2023 CLINICAL DATA:  Orogastric tube placement EXAM: ABDOMEN - 1 VIEW COMPARISON:  None Available. FINDINGS: Orogastric tube tip seen within the proximal body of the stomach with the proximal side hole in the region of the gastroesophageal junction. Nonobstructive bowel gas pattern. No gross free intraperitoneal gas. No organomegaly. Retained contrast opacifies the renal cortices bilaterally related to CT arteriogram performed 04/13/2023. Degenerative changes noted within the lumbar spine. IMPRESSION: 1. Orogastric tube tip within the proximal body of the stomach. Advancement of the catheter by 5-10 cm may more optimally position the device. Electronically Signed   By: Helyn Numbers M.D.   On: 04/14/2023 02:28   DG Chest Port 1 View Result Date: 04/14/2023 CLINICAL DATA:  Chest tube placement EXAM: PORTABLE CHEST 1 VIEW COMPARISON:  04/13/2023 FINDINGS: Endotracheal tube is seen 2.2 cm above the carina. Nasogastric tube extends into the upper abdomen beyond margin of the examination. Right internal jugular central venous catheter is in place with its tip at the superior cavoatrial junction. Lung volumes are small with mild elevation of the right hemidiaphragm. Retrocardiac opacity persists in keeping with atelectasis or infiltrate within this region. Lungs are otherwise clear. No pneumothorax or pleural effusion. Cardiac size within normal limits. Pulmonary vascularity is normal. No acute bone abnormality. No chest tube identified within the visualized thorax. IMPRESSION: 1. Support tubes in appropriate position. 2. No chest tube identified within the visualized thorax. No pneumothorax. 3. Persistent retrocardiac atelectasis or infiltrate. Electronically Signed   By: Helyn Numbers M.D.   On: 04/14/2023 02:26   CARDIAC  CATHETERIZATION Result Date: 04/13/2023   Mid LAD lesion is 30% stenosed.   Ost LAD to Prox LAD lesion is 99% stenosed.   A drug-eluting stent was successfully placed using a STENT ONYX FRONTIER 3.5X12.   Post intervention, there is a 0% residual stenosis.   There is mild left ventricular systolic dysfunction.   The left ventricular ejection fraction is 45-50% by visual estimate. 1.  High-grade in-stent restenosis ostial LAD 2.  Successful PCI with 3.5 x 12 mm Onyx frontier DES 3.  Right groin hematoma Recommendations 1.  Dual antiplatelet therapy uninterrupted 1 year 2.  Abdominal CT to rule out retroperitoneal bleed 3.  Further recommendations pending abdominal CT results   CT Angio Abd/Pel w/ and/or w/o Result Date: 04/13/2023 CLINICAL DATA:  Suspected retroperitoneal hemorrhage from cath lab EXAM: CTA ABDOMEN AND PELVIS WITHOUT AND WITH CONTRAST TECHNIQUE: Multidetector CT imaging of the abdomen and pelvis was performed using the standard protocol during bolus administration of intravenous contrast. Multiplanar reconstructed images and MIPs were obtained and reviewed to evaluate the vascular anatomy. RADIATION DOSE REDUCTION: This exam was performed according to the departmental dose-optimization program which includes automated exposure control, adjustment of the mA and/or kV according to patient size and/or use of iterative reconstruction technique. CONTRAST:  80mL OMNIPAQUE IOHEXOL 350 MG/ML SOLN COMPARISON:  CT abdomen pelvis, 12/13/2021 FINDINGS: VASCULAR Large right-sided retroperitoneal hematoma arising in the vicinity of the right external iliac artery and extending into the right paracolic gutter, dimensions at least 18.6 x 7.7 x 6.2 cm (series 15, image 44, series 10, image 139). Brisk arterial extravasation arising in the vicinity of the right inferior epigastric artery origin and distal portion of the right external iliac artery (series 10, image 161, series 15, image 44). Normal contour and  caliber of the abdominal aorta. No evidence of aortic aneurysm, dissection, or other acute aortic pathology. 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 Moderate mixed calcific atherosclerosis. Review of the MIP images confirms the above findings. NON-VASCULAR Lower Chest: Moderate left, small right pleural effusions and associated atelectasis or consolidation. Bilateral breast implants small hiatal hernia. Hepatobiliary: No solid liver abnormality is seen. No gallstones, gallbladder wall thickening, or biliary dilatation. Pancreas: Unremarkable. No pancreatic ductal dilatation or surrounding inflammatory changes. Spleen: Normal in size without significant abnormality. Adrenals/Urinary Tract: Adrenal glands are unremarkable. Extensive, lobulated bilateral renal cortical scarring. No calculi or hydronephrosis. Excreted contrast within the collecting systems and bladder following catheterization. Foley catheter in the bladder. Stomach/Bowel: Stomach is within normal limits. Appendix appears normal. No evidence of bowel wall thickening, distention, or inflammatory changes. Sigmoid diverticula. Lymphatic: No enlarged abdominal or pelvic lymph nodes. Reproductive: No mass or other significant abnormality. Other: No abdominal wall hernia or abnormality. Small volume hemoperitoneum about the liver and in the pelvis (series 10, image 156, series 5, image 17). Musculoskeletal: No acute osseous findings. IMPRESSION: 1. Large right-sided retroperitoneal hematoma arising in the vicinity of the right external iliac artery and extending into the right paracolic gutter, dimensions at least 18.6 x 7.7 x 6.2 cm. 2. Brisk arterial extravasation arising in the vicinity of the right inferior epigastric artery origin and distal portion of the right external iliac artery. 3. Small volume hemoperitoneum about the liver and in the pelvis. 4. Moderate left,  small right pleural effusions and associated atelectasis or consolidation. These results were called by telephone at the time of interpretation on 04/13/2023 at 9:25 pm to [facility] PAR[facility]CH[facility] , who verbally acknowledged these results. Aortic Atherosclerosis (ICD10-I70.0). Electronically Signed   By: Jearld Lesch M.D.   On: 04/13/2023 21:38   US THORACENTESIS ASP PLEURAL SPACE W/IMG GUIDE Result Date: 04/13/2023 INDICATION: Patient with history of CAD s/p stenting, admitted with complaints of new chest pain and found to have large left pleural effusion. Request for diagnostic and therapeutic left thoracentesis. EXAM: ULTRASOUND GUIDED LEFT THORACENTESIS MEDICATIONS: 15 mL 1% lidocaine COMPLICATIONS: None immediate. PROCEDURE: An ultrasound guided thoracentesis was thoroughly discussed with the patient and questions answered. The benefits, risks, alternatives and complications were also discussed. The patient  understands and wishes to proceed with the procedure. Written consent was obtained. Ultrasound was performed to localize and mark an adequate pocket of fluid in the left chest. The area was then prepped and draped in the normal sterile fashion. 1% Lidocaine was used for local anesthesia. Under ultrasound guidance a 6 Fr Safe-T-Centesis catheter was introduced. Thoracentesis was performed. The catheter was removed and a dressing applied. FINDINGS: A total of approximately 650 mL of clear yellow fluid was removed. Samples were sent to the laboratory as requested by the clinical team. IMPRESSION: Successful ultrasound guided left thoracentesis yielding 650 mL of pleural fluid. Performed by Lynnette Caffey, PA-C Electronically Signed   By: Olive Bass M.D.   On: 04/13/2023 14:13   DG Chest Port 1 View Result Date: 04/13/2023 CLINICAL DATA:  [id number] Pleural effusion on left [id number]. Status post thoracentesis. EXAM: PORTABLE CHEST 1 VIEW COMPARISON:  04/12/2023. FINDINGS: There is small left pleural  effusion with probable associated compressive atelectatic changes in the left lung. There is interval improvement when compared to the prior exam, status post left thoracentesis. No left pneumothorax. Biapical pleural thickening/calcifications again seen. Bilateral lung fields are otherwise clear. There is subtle blunting of right lateral costophrenic angle, which may represent trace right pleural effusion. Normal cardio-mediastinal silhouette. No acute osseous abnormalities. The soft tissues are within normal limits. IMPRESSION: *Small left pleural effusion, improved since the prior study, status post left thoracentesis. No pneumothorax. Electronically Signed   By: Jules Schick M.D.   On: 04/13/2023 12:58    Microbiology: Results for orders placed or performed during the hospital encounter of 04/22/23  Blood Culture (routine x 2)     Status: None   Collection Time: 04/22/23  3:28 PM   Specimen: BLOOD  Result Value Ref Range Status   Specimen Description BLOOD RIGHT ANTECUBITAL  Final   Special Requests   Final    BOTTLES DRAWN AEROBIC AND ANAEROBIC Blood Culture results may not be optimal due to an excessive volume of blood received in culture bottles   Culture   Final    NO GROWTH 5 DAYS Performed at [facility], [address] [place], [place] [address]    Report Status 04/27/2023 FINAL  Final  C Difficile Quick Screen w PCR reflex     Status: None   Collection Time: 04/22/23  4:19 PM   Specimen: STOOL  Result Value Ref Range Status   C Diff antigen NEGATIVE NEGATIVE Final   C Diff toxin NEGATIVE NEGATIVE Final   C Diff interpretation No C. difficile detected.  Final    Comment: Performed at [facility], [address] [place], [place] [address]  Gastrointestinal Panel by PCR , Stool     Status: None   Collection Time: 04/22/23  4:25 PM   Specimen: Stool  Result Value Ref Range Status   Campylobacter species NOT DETECTED NOT DETECTED Final   Plesimonas  shigelloides NOT DETECTED NOT DETECTED Final   Salmonella species NOT DETECTED NOT DETECTED Final   Yersinia enterocolitica NOT DETECTED NOT DETECTED Final   Vibrio species NOT DETECTED NOT DETECTED Final   Vibrio cholerae NOT DETECTED NOT DETECTED Final   Enteroaggregative E coli (EAEC) NOT DETECTED NOT DETECTED Final   Enteropathogenic E coli (EPEC) NOT DETECTED NOT DETECTED Final   Enterotoxigenic E coli (ETEC) NOT DETECTED NOT DETECTED Final   Shiga like toxin producing E coli (STEC) NOT DETECTED NOT DETECTED Final   Shigella/Enteroinvasive E coli (EIEC) NOT DETECTED NOT DETECTED  Final   Cryptosporidium NOT DETECTED NOT DETECTED Final   Cyclospora cayetanensis NOT DETECTED NOT DETECTED Final   Entamoeba histolytica NOT DETECTED NOT DETECTED Final   Giardia lamblia NOT DETECTED NOT DETECTED Final   Adenovirus F40/41 NOT DETECTED NOT DETECTED Final   Astrovirus NOT DETECTED NOT DETECTED Final   Norovirus GI/GII NOT DETECTED NOT DETECTED Final   Rotavirus A NOT DETECTED NOT DETECTED Final   Sapovirus (I, II, IV, and V) NOT DETECTED NOT DETECTED Final    Comment: Performed at [facility], [address] [place], [place] [address]  Blood Culture (routine x 2)     Status: None   Collection Time: 04/23/23  6:31 AM   Specimen: BLOOD  Result Value Ref Range Status   Specimen Description BLOOD BLOOD RIGHT HAND  Final   Special Requests   Final    BOTTLES DRAWN AEROBIC AND ANAEROBIC Blood Culture results may not be optimal due to an inadequate volume of blood received in culture bottles   Culture   Final    NO GROWTH 5 DAYS Performed at [facility], [address], [place], [place] [address]    Report Status 04/28/2023 FINAL  Final  Aerobic Culture w Gram Stain (superficial specimen)     Status: None   Collection Time: 04/23/23 10:42 AM   Specimen: Groin  Result Value Ref Range Status   Specimen Description   Final    GROIN Performed at [facility],  [address] [place], [place] [address]    Special Requests   Final    NONE Performed at [facility], [address] [place], [place] [address]    Gram Stain   Final    RARE WBC PRESENT, PREDOMINANTLY PMN RARE GRAM NEGATIVE RODS RARE GRAM NEGATIVE COCCOBACILLI Performed at [facility], [address] [place], [place] [address]    Culture   Final    MODERATE MORGANELLA MORGANII FEW ESCHERICHIA COLI FEW PROTEUS MIRABILIS    Report Status 04/26/2023 FINAL  Final   Organism ID, Bacteria ESCHERICHIA COLI  Final   Organism ID, Bacteria MORGANELLA MORGANII  Final   Organism ID, Bacteria PROTEUS MIRABILIS  Final      Susceptibility   Escherichia coli - MIC*    AMPICILLIN >=32 RESISTANT Resistant     CEFEPIME <=0.12 SENSITIVE Sensitive     CEFTAZIDIME <=1 SENSITIVE Sensitive     CEFTRIAXONE <=0.25 SENSITIVE Sensitive     CIPROFLOXACIN <=0.25 SENSITIVE Sensitive     GENTAMICIN <=1 SENSITIVE Sensitive     IMIPENEM <=0.25 SENSITIVE Sensitive     TRIMETH/SULFA <=20 SENSITIVE Sensitive     AMPICILLIN/SULBACTAM 16 INTERMEDIATE Intermediate     PIP/TAZO <=4 SENSITIVE Sensitive ug/mL    * FEW ESCHERICHIA COLI   Morganella morganii - MIC*    AMPICILLIN >=32 RESISTANT Resistant     CEFTAZIDIME <=1 SENSITIVE Sensitive     CIPROFLOXACIN <=0.25 SENSITIVE Sensitive     GENTAMICIN <=1 SENSITIVE Sensitive     IMIPENEM 4 SENSITIVE Sensitive     TRIMETH/SULFA <=20 SENSITIVE Sensitive     AMPICILLIN/SULBACTAM 16 INTERMEDIATE Intermediate     PIP/TAZO <=4 SENSITIVE Sensitive ug/mL    * MODERATE MORGANELLA MORGANII   Proteus mirabilis - MIC*    AMPICILLIN <=2 SENSITIVE Sensitive     CEFEPIME <=0.12 SENSITIVE Sensitive     CEFTAZIDIME <=1 SENSITIVE Sensitive     CEFTRIAXONE <=0.25 SENSITIVE Sensitive     CIPROFLOXACIN <=0.25 SENSITIVE Sensitive  GENTAMICIN <=1 SENSITIVE Sensitive     IMIPENEM 2 SENSITIVE Sensitive     TRIMETH/SULFA <=20 SENSITIVE Sensitive      AMPICILLIN/SULBACTAM <=2 SENSITIVE Sensitive     PIP/TAZO <=4 SENSITIVE Sensitive ug/mL    * FEW PROTEUS MIRABILIS  Aerobic/Anaerobic Culture w Gram Stain (surgical/deep wound)     Status: None   Collection Time: 04/25/23  2:41 PM   Specimen: Wound; Body Fluid  Result Value Ref Range Status   Specimen Description   Final    WOUND Performed at [facility], [address] [place], [place] [address]    Special Requests RETROPERITONEAL HEMATOMA SPEC A  Final   Gram Stain   Final    FEW WBC PRESENT,BOTH PMN AND MONONUCLEAR NO ORGANISMS SEEN    Culture   Final    RARE ENTEROCOCCUS FAECALIS NO ANAEROBES ISOLATED Performed at [facility], [address] [place], [place] [address]    Report Status 05/02/2023 FINAL  Final   Organism ID, Bacteria ENTEROCOCCUS FAECALIS  Final      Susceptibility   Enterococcus faecalis - MIC*    AMPICILLIN <=2 SENSITIVE Sensitive     VANCOMYCIN 2 SENSITIVE Sensitive     GENTAMICIN SYNERGY SENSITIVE Sensitive     * RARE ENTEROCOCCUS FAECALIS    Labs: CBC: Recent Labs  Lab 05/06/23 1338 05/07/23 0540 05/08/23 0338 05/09/23 0530 05/10/23 1347 05/12/23 0618 05/13/23 0417  WBC 7.2   < > 7.1 7.0 6.8 5.0 6.0  NEUTROABS 5.4  --   --   --   --  3.5  --   HGB 9.8*   < > 8.3* 7.8* 8.2* 7.6* 10.0*  HCT 29.8*   < > 25.2* 24.1* 25.9* 23.7* 30.6*  MCV 87.9   < > 87.2 88.9 91.8 90.1 89.5  PLT 50*   < > 43* 69* 135* 175 214   < > = values in this interval not displayed.   Basic Metabolic Panel: Recent Labs  Lab 05/07/23 0540 05/08/23 0338 05/09/23 0530 05/10/23 0645 05/11/23 0350 05/12/23 0618 05/13/23 0417  NA 138 140 141  --   --  144 142  K 4.4 4.3 4.4  --   --  4.1 3.9  CL 105 108 106  --   --  112* 109  CO2 25 24 25   --   --  25 25  GLUCOSE 107* 118* 104*  --   --  96 95  BUN 87* 83* 69*  --   --  48* 40*  CREATININE 4.46* 4.08* 3.24* 2.72* 2.45* 1.94* 1.65*  CALCIUM 8.5* 8.4* 8.4*  --   --  8.3* 8.3*   Liver Function  Tests: Recent Labs  Lab 05/12/23 0618  AST 17  ALT 9  ALKPHOS 44  BILITOT 0.6  PROT 4.8*  ALBUMIN 2.2*   CBG: No results for input(s): "GLUCAP" in the last 168 hours.  Discharge time spent: greater than 30 minutes.  Signed: Lucile Shutters, MD [facility] 05/13/2023

## 2023-05-13 NOTE — Progress Notes (Signed)
PULMONOLOGY         Date: 05/13/2023,   MRN# [id number] Dawn Bruce [birth year]     AdmissionWeight: 54.4 kg                 CurrentWeight: 66 kg  Referring provider: Dr Gilda Crease   CHIEF COMPLAINT:   Complete atelectasis of left lung with bilateral pleural effusions.    HISTORY OF PRESENT ILLNESS   77 year old female with a history of chronic GERD, basal cell carcinoma squamous cell carcinoma history of NSTEMI diastolic CHF, CAD status post recent PCI with DES, chronic A-fib on Eliquis, essential hypertension, dyslipidemia CKD 3A chronic pain syndrome remote perforated appendicitis, hospitalized November 2024 with non-STEMI.  Post left heart cath had developed retroperitoneal hematoma and hemorrhagic shock with complications of iliac artery laceration.  Status post surgical repair via vascular surgery service.  Had outpatient therapy with antibiotics with failure came in with transient hypotension and circulatory shock.  Found to have elevated cardiac biomarkers and acute on chronic CHF.  Vascular surgery was reconsulted today and PCCM consultation was placed to incidental finding of complete atelectasis of the left lung with surrounding bilateral pleural effusions worse on the left.  04/25/23- patient evaluated in PACU post op , appears to be mildy sedated still. Mildy hypotensive at this moment.  Lung sounds much improved post 1.6L left pleural space aspiration.  04/26/23- patient seen and examined , [name] (daughter) at bedside.  Patient receiving blood transfusion.  Lung sounds are clear , vitals are stable.  Hypoalbulminemia continues. Will order urine protein and pre albumin levels,  because patient shares she's eating well.  CXR in am to check interval effusion.  04/27/23- patient seen at bedside, husband and daughter present.  For surgery wound washout and vac exchange today.   Vitals stable , room air spO2 99%     04/30/23- patient seen at bedside.  Has additional  surgery planned with debridement.  CXR with recurring effusion and associated compressive atelelctasis.   Ideally would like to increase diuresis and recruitment maunevers.  E. Faecallis noted on wound culture.  I discussed case with Dr Avon Gully infectious disease specialist today.  Dr Dareen Piano is also considering pleurX catheter which patient may need if this does not resolve with medical management. She already had multiple thoracentesis. No noew labs are available for review but are in process. Will order purewick for urination due to physical weakness.      05/01/23-  patient reports improvement post diuresis.  Will obtain interval CXR today.  We discussed PleurX permanent tunneled catheter and she declined to have this done at the moment.  She is on room air with normoxia.  She developed mild contraction and we will take diuretic holiday.   05/02/23- patient with severe hypotension.  I have dcd her beta blocker today and imdur. She is now getting IV fluids and is able to speak.  Will start midodrine 5mg  po tid.  05/03/23- patient is now no longer hypotensive.  She had AKI and this is very likely related to circulatory shock over past 24h.  She had fluid via IV and I'm concerned regarding reacculumation of pleural fluid so I have dcd this now since she is hypertensive.  There is nephrology consult in progress.  05/04/23- patient is improved clinically, she has improved vital signs,  she does have AKI but she is making light yellow urine.  She has nephrology evaluation.  There is consideration for HD.  05/06/23- patient clinically  reporting mildy worsening SOB.  Repeat CXR reviewed by me with improved right hemithorax but worse on left with reaccumulated effusion moderate.  She has progressive thrombocytopenia with today platelets <51, does not appear toxic at all and is lucid alert and appropriate.  She has PCI x2 in the past.  Patient is uremic and am concerned regarding potential for bleeding but  same time concern for stent thrombosis/clotting with AF/CAD.  Have discussed with team and will ask for additional evaluation with cardiology.  Dr Chipper Herb has also notified oncology to help with blood abnormalities.   05/07/23- patient continues to produce adequate urine light yellow in color renal function is improved, respiratory status however seems  more labored today.  Her pleural effusion has re-accumulated and we will order US thoracentesis today. She had oncology and cardiology evaluation.  It seems thrombocytopenia may be due to Zosyn.   She had vascular surgery evaluation today.   05/08/23- patient seen at bedside.  She reports sudden onset of cough after eating yesterday, suspect due to aspiration.  She had some nuts while laying in bed. We discussed this and asked to perform aspiration precautions with meals. She had Left thoracentesis with improved CXR post procedure.  Today we plan to drain right pleural space. She has improved renal function and is urinating adequately.  She has multiple specialists following her care, appreciate everyone involved.  Her vitals appear stable.  Platelet counts have improved.  She is ambulatory with PT/OT.  Overall she is clinically improved.  05/09/23- patient is further improved.  She is ambulatory without dyspnea.  AKI is improved. CXR reviwed and improved. Plan for OR in am.  05/10/23- patient further improved.  Bloodwork is better with improved renal function and platelet count. Left hemithorax with reduced lung sounds.  She reports dressing from groin is loose. Plan for cxr in am. She on cipro and augmentin. Using IS and working with PT.  05/11/23- patient s/p thoracentesis  today, she is breathing better and is off O2.  Renal fucntion is better. I met with husband he was asking for O2 for home use.  She may infact need this and we will order today home O2.  She still requires therapy for infection plan for dc on Sunday.   05/12/23- patient seen at bedside. H/h  slighly lower today.  Mild tenderness at right groin but clinically denies that its worse.  She had pRBC transfusion x 5 already and does not seem to be allergic to blood products.  Ive discussed improving her blood counts prior to dc home to help with severe fatigue, dyspnea, and due to blood loss she had while hospitalized. Her kidneys are improved. CXR repeat today.  05/13/23- patient is improved she had prbc transfusion, feels no dyspnea at all.  She is on room air.  Nephrologist has seen her while I was in room and provided follow up instructions.  Reviewed follow up plan with patient.   PAST MEDICAL HISTORY   Past Medical History:  Diagnosis Date   Actinic keratosis    Chronic pain    GERD (gastroesophageal reflux disease) ?   Taking Pepcid   History of basal cell carcinoma (BCC) 05/02/2021   right upper back paraspinal   History of SCC (squamous cell carcinoma) of skin 09/10/2019   left dorsum wrist ED&C done 10/28/19   History of SCC (squamous cell carcinoma) of skin 03/04/2020   right dorsum hand   History of squamous cell carcinoma in situ (SCCIS) 03/04/2020  left dorsum wrist  ED&C   History of squamous cell carcinoma in situ (SCCIS) 03/04/2020   right medial infraorbital  ED&C 04/28/2020   History of squamous cell carcinoma in situ (SCCIS) 10/28/2019   right dorsum hand proximal lateral   History of squamous cell carcinoma in situ (SCCIS) 10/28/2019   right dorsum hand proximal medial   Hyperlipidemia    Hypertension    NSTEMI (non-ST elevated myocardial infarction) (HCC) 04/12/2023   Renal disorder    Squamous cell carcinoma in situ 10/31/2021   left distal  tricep, EDC   Squamous cell carcinoma of skin 08/28/2022   Right volar forearm - [facility]     SURGICAL HISTORY   Past Surgical History:  Procedure Laterality Date   ABDOMINAL HYSTERECTOMY     APPENDECTOMY  April 2023   Burst appendix   APPLICATION OF WOUND VAC Right 04/25/2023   Procedure: APPLICATION OF WOUND  VAC;  Surgeon: Renford Dills, MD;  Location: [facility] ORS;  Service: Vascular;  Laterality: Right;   APPLICATION OF WOUND VAC Right 04/27/2023   Procedure: WOUND VAC EXCHANGE;  Surgeon: Renford Dills, MD;  Location: [facility] ORS;  Service: Vascular;  Laterality: Right;   APPLICATION OF WOUND VAC Right 05/01/2023   Procedure: APPLICATION OF WOUND VAC;  Surgeon: Renford Dills, MD;  Location: [facility] ORS;  Service: Vascular;  Laterality: Right;   AUGMENTATION MAMMAPLASTY Bilateral    25-30 years ago   CARDIOVERSION N/A 12/01/2020   Procedure: CARDIOVERSION;  Surgeon: Debbe Odea, MD;  Location: [facility] ORS;  Service: Cardiovascular;  Laterality: N/A;   CAROTID ARTERY ANGIOPLASTY Right 1995(approximate)   CAROTID ENDARTERECTOMY  2001   CORONARY STENT INTERVENTION N/A 04/13/2023   Procedure: CORONARY STENT INTERVENTION;  Surgeon: Marcina Millard, MD;  Location: [facility] INVASIVE [facility] LAB;  Service: Cardiovascular;  Laterality: N/A;   COSMETIC SURGERY     ELBOW SURGERY Left    ENDARTERECTOMY FEMORAL Right 04/13/2023   Procedure: Right groin exploration with repair of right external iliac artery and right circumflex artery;  Surgeon: Bertram Denver, MD;  Location: [facility] ORS;  Service: Vascular;  Laterality: Right;   FRACTURE SURGERY  2016   Arm   INCISION AND DRAINAGE OF WOUND Right 04/25/2023   Procedure: IRRIGATION AND DEBRIDEMENT WOUND;  Surgeon: Renford Dills, MD;  Location: [facility] OR[facility];  Service: Vascular;  Laterality: Right;   INCISION AND DRAINAGE OF WOUND Right 04/27/2023   Procedure: IRRIGATION AND DEBRIDEMENT WOUND;  Surgeon: Renford Dills, MD;  Location: [facility] OR[facility];  Service: Vascular;  Laterality: Right;   INCISION AND DRAINAGE OF WOUND Right 05/01/2023   Procedure: IRRIGATION AND DEBRIDEMENT WOUND;  Surgeon: Renford Dills, MD;  Location: [facility];  Service: Vascular;  Laterality: Right;   LEFT HEART CATH AND CORONARY ANGIOGRAPHY N/A 04/13/2023   Procedure: LEFT HEART  CATH AND CORONARY ANGIOGRAPHY;  Surgeon: Marcina Millard, MD;  Location: [facility];  Service: Cardiovascular;  Laterality: N/A;   THORACENTESIS N/A 12/27/2020   Procedure: Alanson Puls;  Surgeon: Josephine Igo, DO;  Location: [facility];  Service: Pulmonary;  Laterality: N/A;   TONSILLECTOMY     TUBAL LIGATION  ?     FAMILY HISTORY   Family History  Problem Relation Age of Onset   Cancer Mother    Cancer Father    Cancer Sister    Breast cancer Neg Hx      SOCIAL HISTORY   Social History   Tobacco Use   Smoking status: Former  Current packs/day: 0.00    Average packs/day: 0.3 packs/day for 15.0 years (3.8 ttl pk-yrs)    Types: Cigarettes    Start date: 30    Quit date: 55    Years since quitting: 34.9    Passive exposure: Past   Smokeless tobacco: Never   Tobacco comments:    quit around 1990-1995 (?)  Vaping Use   Vaping status: Never Used  Substance Use Topics   Alcohol use: Yes    Alcohol/week: 14.0 standard drinks of alcohol    Types: 14 Standard drinks or equivalent per week    Comment: 2 scotch shots   Drug use: Never     MEDICATIONS    Home Medication:     Current Medication:  Current Facility-Administered Medications:    acetaminophen (TYLENOL) tablet 650 mg, 650 mg, Oral, Q6H PRN, Schnier, Latina Craver, MD, 650 mg at 05/12/23 2106   albuterol (PROVENTIL) (2.5 MG/3ML) 0.083% nebulizer solution 2.5 mg, 2.5 mg, Nebulization, Q4H PRN, Schnier, Latina Craver, MD, 2.5 mg at 05/12/23 0811   alum & mag hydroxide-simeth (MAALOX/MYLANTA) 200-200-20 MG/5ML suspension 15 mL, 15 mL, Oral, Once, Schnier, Latina Craver, MD   amoxicillin-clavulanate (AUGMENTIN) 500-125 MG per tablet 1 tablet, 1 tablet, Oral, BID, Aleda Grana, RPH, 1 tablet at 05/13/23 0950   apixaban (ELIQUIS) tablet 2.5 mg, 2.5 mg, Oral, BID, Schnier, Latina Craver, MD, 2.5 mg at 05/13/23 1308   aspirin EC tablet 81 mg, 81 mg, Oral, Daily, Schnier, Latina Craver, MD, 81 mg at  05/13/23 0951   atorvastatin (LIPITOR) tablet 40 mg, 40 mg, Oral, Daily, Karna Christmas, Ege Muckey, MD, 40 mg at 05/13/23 0950   benzonatate (TESSALON) capsule 200 mg, 200 mg, Oral, TID PRN, Vida Rigger, MD, 200 mg at 05/09/23 1703   carvedilol (COREG) tablet 12.5 mg, 12.5 mg, Oral, BID WC, Marrion Coy, MD, 12.5 mg at 05/13/23 6578   Chlorhexidine Gluconate Cloth 2 % PADS 6 each, 6 each, Topical, Daily, Schnier, Latina Craver, MD, 6 each at 05/13/23 0954   ciprofloxacin (CIPRO) tablet 500 mg, 500 mg, Oral, Q breakfast, Ravishankar, Jayashree, MD, 500 mg at 05/13/23 0950   clopidogrel (PLAVIX) tablet 75 mg, 75 mg, Oral, Daily, Schnier, Latina Craver, MD, 75 mg at 05/13/23 0949   ezetimibe (ZETIA) tablet 10 mg, 10 mg, Oral, Daily, Schnier, Latina Craver, MD, 10 mg at 05/13/23 0950   feeding supplement (ENSURE ENLIVE / ENSURE PLUS) liquid 237 mL, 237 mL, Oral, BID BM, Marrion Coy, MD, 237 mL at 05/13/23 0953   fentaNYL (DURAGESIC) 75 MCG/HR 1 patch, 1 patch, Transdermal, Q72H, Schnier, Latina Craver, MD, 1 patch at 05/11/23 4696   oxyCODONE (Oxy IR/ROXICODONE) immediate release tablet 5 mg, 5 mg, Oral, Q6H PRN, Schnier, Latina Craver, MD, 5 mg at 05/04/23 0008   phenylephrine ((USE for PREPARATION-H)) 0.25 % suppository 1 suppository, 1 suppository, Rectal, PRN, Schnier, Latina Craver, MD   pregabalin (LYRICA) capsule 75 mg, 75 mg, Oral, Daily, Marrion Coy, MD, 75 mg at 05/13/23 0949   sodium chloride flush (NS) 0.9 % injection 10-40 mL, 10-40 mL, Intracatheter, Q12H, Schnier, Latina Craver, MD, 10 mL at 05/13/23 0953   sodium chloride flush (NS) 0.9 % injection 10-40 mL, 10-40 mL, Intracatheter, PRN, Schnier, Latina Craver, MD, 10 mL at 05/11/23 2101   traZODone (DESYREL) tablet 50 mg, 50 mg, Oral, QHS, Schnier, Latina Craver, MD, 50 mg at 05/12/23 2107   witch hazel-glycerin (TUCKS) pad, , Topical, PRN, Marrion Coy, MD    ALLERGIES   Patient  has no known allergies.     REVIEW OF SYSTEMS    Review of Systems:  Gen:  Denies   fever, sweats, chills weigh loss  HEENT: Denies blurred vision, double vision, ear pain, eye pain, hearing loss, nose bleeds, sore throat Cardiac:  No dizziness, chest pain or heaviness, chest tightness,edema Resp:   reports dyspnea chronically  Gi: Denies swallowing difficulty, stomach pain, nausea or vomiting, diarrhea, constipation, bowel incontinence Gu:  Denies bladder incontinence, burning urine Ext:   Denies Joint pain, stiffness or swelling Skin: Denies  skin rash, easy bruising or bleeding or hives Endoc:  Denies polyuria, polydipsia , polyphagia or weight change Psych:   Denies depression, insomnia or hallucinations   Other:  All other systems negative   VS: BP (!) 178/72 (BP Location: Right Wrist) Comment: 164/81 2nd time, RN notified  Pulse 93   Temp 98.8 F (37.1 C) (Oral)   Resp 16   Ht 5' 2.99" (1.6 m)   Wt 66 kg   SpO2 99%   BMI 25.78 kg/m      PHYSICAL EXAM    GENERAL:NAD, no fevers, chills, no weakness no fatigue HEAD: Normocephalic, atraumatic.  EYES: Pupils equal, round, reactive to light. Extraocular muscles intact. No scleral icterus.  MOUTH: Moist mucosal membrane. Dentition intact. No abscess noted.  EAR, NOSE, THROAT: Clear without exudates. No external lesions.  NECK: Supple. No thyromegaly. No nodules. No JVD.  PULMONARY: decreased breath sounds with mild rhonchi worse at bases bilaterally.  CARDIOVASCULAR: S1 and S2. Regular rate and rhythm. No murmurs, rubs, or gallops. No edema. Pedal pulses 2+ bilaterally.  GASTROINTESTINAL: Soft, nontender, nondistended. No masses. Positive bowel sounds. No hepatosplenomegaly.  MUSCULOSKELETAL: No swelling, clubbing, or edema. Range of motion full in all extremities.  NEUROLOGIC: Cranial nerves II through XII are intact. No gross focal neurological deficits. Sensation intact. Reflexes intact.  SKIN: No ulceration, lesions, rashes, or cyanosis. Skin warm and dry. Turgor intact.  PSYCHIATRIC: Mood, affect within  normal limits. The patient is awake, alert and oriented x 3. Insight, judgment intact.       IMAGING     ASSESSMENT/PLAN    Complete atelectasis of left lung- IMPROVED    - patient s/p diuresis with partial improvement however still has minimal lung sounds on left.   - she does have CKD 3 and may not produce adequate fluid removal via medication alone   - we discussed options for thoracentesis and patient would like to proceed with this procedure.  -incentive spirometry at bedside to be used multiple times daily  - PT/OT - etiology is likely poor nutrition post ICU hospitalization with CHF and CKD all contributing to third spaced interstitial edema , peripheral edema and effusion -patient improved clinically.  H/h slighly lower with some blood in effusion noted.  Discussed pRBC.     Bilateral pleural effusions    - s/p throacentesis- multiple times   - consultation for IR with US guided throacentesis - therapeutic    -patient on eliquis -prealbumin -urine protein -holding diuresis today due to [facility] and will continue to monitor renal function.  I anticipate this should improve with normal BP now. -appreciate nephrology consultation  -05/06/23- CXR with reaccumuation of left effusion - treated with thoracentesis 12/17- s/p right thoracentesis  Severe AKI stage 5- RESOLVED     Nephrology on case -appreciate input     - non oliguric at this moment , no HD today per specialist    AF with CAD s/p PCI  x 2   - patient on plavix and eliquis     - s/p complicated surgery with multiple transfusions.    -Thrombocytopenia improved    Severe thrombocytopenia   - oncology on case - appreciate input - Dr B   - workup in progress - platelet count trending up   Sepsis due to right groin cellulitis   S/p vascular surgery eval S/p vanco /rocephin  - patient appears to be clinically improved.  - s/p ID revaluation - appreciate input  -E faecalis noted on wound care   Moderate  protein calorie malnutrition   - nutritional consultation    - bitemporal wasting and peripheral muscle weakness noted   - contributing to edema/effusion   - complicating tissue healing  -high protein diet --patient aspirated on evening of 05/07/23  Thank you for allowing me to participate in the care of this patient.  Patient/Family are satisfied with care plan and all questions have been answered.    Provider disclosure: Patient with at least one acute or chronic illness or injury that poses a threat to life or bodily function and is being managed actively during this encounter.  All of the below services have been performed independently by signing provider:  review of prior documentation from internal and or external health records.  Review of previous and current lab results.  Interview and comprehensive assessment during patient visit today. Review of current and previous chest radiographs/CT scans. Discussion of management and test interpretation with health care team and patient/family.   This document was prepared using Dragon voice recognition software and may include unintentional dictation errors.     Vida Rigger, M.D.  Division of Pulmonary & Critical Care Medicine

## 2023-05-13 NOTE — Progress Notes (Signed)
[facility]  ROUNDING NOTE   Subjective:   Dawn Bruce  is a 77 y.o. female with medical problems of hypertension, heart failure with preserved ejection fraction, history of pleural effusions, history of diffuse vascular disease including coronary artery disease-history of PCI in LAD stent October 2023, chronic kidney disease, prediabetes, atrial fibrillation status post cardioversion in 2022 followed by Dr. Alyson Locket at [facility] nephrology. Patient presents to the ED with hypotension and suspected wound infection. She has been admitted for Cellulitis of right groin [L03.314] Cellulitis of right thigh [L03.115]  Patient is known to our practice from previous admission.   Patient sitting up in bed Husband and daughter at bedside Denies pain Room air Appetite slowly improving.    Objective:  Vital signs in last 24 hours:  Temp:  [97.5 F (36.4 C)-98.8 F (37.1 C)] 98.8 F (37.1 C) (12/22 0844) Pulse Rate:  [88-94] 93 (12/22 0844) Resp:  [16-20] 16 (12/22 0844) BP: (131-178)/(57-84) 178/72 (12/22 0844) SpO2:  [95 %-100 %] 99 % (12/22 0844) Weight:  [66 kg] 66 kg (12/22 0400)  Weight change: -1.2 kg Filed Weights   05/11/23 0427 05/12/23 0416 05/13/23 0400  Weight: 66.3 kg 67.2 kg 66 kg    Intake/Output: I/O last 3 completed shifts: In: 1402.3 [P.O.:600; I.V.:7.3; Blood:795] Out: 1405 [Urine:1300; Drains:105]   Intake/Output this shift:  No intake/output data recorded.  Physical Exam: General: NAD  Head: Normocephalic, atraumatic. Moist oral mucosal membranes  Eyes: Anicteric  Lungs:  Clear to auscultation.normal effort  Heart: Regular rate and rhythm  Abdomen:  Soft, nontender  Extremities:  No peripheral edema.  Neurologic: Alert and oriented, moving all four extremities  Skin: No lesions       Basic Metabolic Panel: Recent Labs  Lab 05/07/23 0540 05/08/23 0338 05/09/23 0530 05/10/23 0645 05/11/23 0350 05/12/23 0618 05/13/23 0417  NA 138 140  141  --   --  144 142  K 4.4 4.3 4.4  --   --  4.1 3.9  CL 105 108 106  --   --  112* 109  CO2 25 24 25   --   --  25 25  GLUCOSE 107* 118* 104*  --   --  96 95  BUN 87* 83* 69*  --   --  48* 40*  CREATININE 4.46* 4.08* 3.24* 2.72* 2.45* 1.94* 1.65*  CALCIUM 8.5* 8.4* 8.4*  --   --  8.3* 8.3*    Liver Function Tests: Recent Labs  Lab 05/12/23 0618  AST 17  ALT 9  ALKPHOS 44  BILITOT 0.6  PROT 4.8*  ALBUMIN 2.2*    No results for input(s): "LIPASE", "AMYLASE" in the last 168 hours. No results for input(s): "AMMONIA" in the last 168 hours.  CBC: Recent Labs  Lab 05/06/23 1338 05/07/23 0540 05/08/23 0338 05/09/23 0530 05/10/23 1347 05/12/23 0618 05/13/23 0417  WBC 7.2   < > 7.1 7.0 6.8 5.0 6.0  NEUTROABS 5.4  --   --   --   --  3.5  --   HGB 9.8*   < > 8.3* 7.8* 8.2* 7.6* 10.0*  HCT 29.8*   < > 25.2* 24.1* 25.9* 23.7* 30.6*  MCV 87.9   < > 87.2 88.9 91.8 90.1 89.5  PLT 50*   < > 43* 69* 135* 175 214   < > = values in this interval not displayed.    Cardiac Enzymes: Recent Labs  Lab 05/07/23 1822  CKTOTAL 39    BNP: Invalid  input(s): "POCBNP"  CBG: No results for input(s): "GLUCAP" in the last 168 hours.   Microbiology: Results for orders placed or performed during the hospital encounter of 04/22/23  Blood Culture (routine x 2)     Status: None   Collection Time: 04/22/23  3:28 PM   Specimen: BLOOD  Result Value Ref Range Status   Specimen Description BLOOD RIGHT ANTECUBITAL  Final   Special Requests   Final    BOTTLES DRAWN AEROBIC AND ANAEROBIC Blood Culture results may not be optimal due to an excessive volume of blood received in culture bottles   Culture   Final    NO GROWTH 5 DAYS Performed at [facility], [address], [place], [place] [address]    Report Status 04/27/2023 FINAL  Final  C Difficile Quick Screen w PCR reflex     Status: None   Collection Time: 04/22/23  4:19 PM   Specimen: STOOL  Result Value Ref Range Status    C Diff antigen NEGATIVE NEGATIVE Final   C Diff toxin NEGATIVE NEGATIVE Final   C Diff interpretation No C. difficile detected.  Final    Comment: Performed at [facility], [address] [place], [place] [address]  Gastrointestinal Panel by PCR , Stool     Status: None   Collection Time: 04/22/23  4:25 PM   Specimen: Stool  Result Value Ref Range Status   Campylobacter species NOT DETECTED NOT DETECTED Final   Plesimonas shigelloides NOT DETECTED NOT DETECTED Final   Salmonella species NOT DETECTED NOT DETECTED Final   Yersinia enterocolitica NOT DETECTED NOT DETECTED Final   Vibrio species NOT DETECTED NOT DETECTED Final   Vibrio cholerae NOT DETECTED NOT DETECTED Final   Enteroaggregative E coli (EAEC) NOT DETECTED NOT DETECTED Final   Enteropathogenic E coli (EPEC) NOT DETECTED NOT DETECTED Final   Enterotoxigenic E coli (ETEC) NOT DETECTED NOT DETECTED Final   Shiga like toxin producing E coli (STEC) NOT DETECTED NOT DETECTED Final   Shigella/Enteroinvasive E coli (EIEC) NOT DETECTED NOT DETECTED Final   Cryptosporidium NOT DETECTED NOT DETECTED Final   Cyclospora cayetanensis NOT DETECTED NOT DETECTED Final   Entamoeba histolytica NOT DETECTED NOT DETECTED Final   Giardia lamblia NOT DETECTED NOT DETECTED Final   Adenovirus F40/41 NOT DETECTED NOT DETECTED Final   Astrovirus NOT DETECTED NOT DETECTED Final   Norovirus GI/GII NOT DETECTED NOT DETECTED Final   Rotavirus A NOT DETECTED NOT DETECTED Final   Sapovirus (I, II, IV, and V) NOT DETECTED NOT DETECTED Final    Comment: Performed at [facility], [address] [place], [place] [address]  Blood Culture (routine x 2)     Status: None   Collection Time: 04/23/23  6:31 AM   Specimen: BLOOD  Result Value Ref Range Status   Specimen Description BLOOD BLOOD RIGHT HAND  Final   Special Requests   Final    BOTTLES DRAWN AEROBIC AND ANAEROBIC Blood Culture results may not be optimal due to an inadequate  volume of blood received in culture bottles   Culture   Final    NO GROWTH 5 DAYS Performed at [facility], [address] [place], [place] [address]    Report Status 04/28/2023 FINAL  Final  Aerobic Culture w Gram Stain (superficial specimen)     Status: None   Collection Time: 04/23/23 10:42 AM   Specimen: Groin  Result Value Ref Range Status   Specimen Description   Final  GROIN Performed at [facility], [address], [place], [place] [address]    Special Requests   Final    NONE Performed at [facility], [address] [place], [place] [address]    Gram Stain   Final    RARE WBC PRESENT, PREDOMINANTLY PMN RARE GRAM NEGATIVE RODS RARE GRAM NEGATIVE COCCOBACILLI Performed at [facility], [address] [place], [place] [address]    Culture   Final    MODERATE MORGANELLA MORGANII FEW ESCHERICHIA COLI FEW PROTEUS MIRABILIS    Report Status 04/26/2023 FINAL  Final   Organism ID, Bacteria ESCHERICHIA COLI  Final   Organism ID, Bacteria MORGANELLA MORGANII  Final   Organism ID, Bacteria PROTEUS MIRABILIS  Final      Susceptibility   Escherichia coli - MIC*    AMPICILLIN >=32 RESISTANT Resistant     CEFEPIME <=0.12 SENSITIVE Sensitive     CEFTAZIDIME <=1 SENSITIVE Sensitive     CEFTRIAXONE <=0.25 SENSITIVE Sensitive     CIPROFLOXACIN <=0.25 SENSITIVE Sensitive     GENTAMICIN <=1 SENSITIVE Sensitive     IMIPENEM <=0.25 SENSITIVE Sensitive     TRIMETH/SULFA <=20 SENSITIVE Sensitive     AMPICILLIN/SULBACTAM 16 INTERMEDIATE Intermediate     PIP/TAZO <=4 SENSITIVE Sensitive ug/mL    * FEW ESCHERICHIA COLI   Morganella morganii - MIC*    AMPICILLIN >=32 RESISTANT Resistant     CEFTAZIDIME <=1 SENSITIVE Sensitive     CIPROFLOXACIN <=0.25 SENSITIVE Sensitive     GENTAMICIN <=1 SENSITIVE Sensitive     IMIPENEM 4 SENSITIVE Sensitive     TRIMETH/SULFA <=20 SENSITIVE Sensitive     AMPICILLIN/SULBACTAM 16 INTERMEDIATE Intermediate      PIP/TAZO <=4 SENSITIVE Sensitive ug/mL    * MODERATE MORGANELLA MORGANII   Proteus mirabilis - MIC*    AMPICILLIN <=2 SENSITIVE Sensitive     CEFEPIME <=0.12 SENSITIVE Sensitive     CEFTAZIDIME <=1 SENSITIVE Sensitive     CEFTRIAXONE <=0.25 SENSITIVE Sensitive     CIPROFLOXACIN <=0.25 SENSITIVE Sensitive     GENTAMICIN <=1 SENSITIVE Sensitive     IMIPENEM 2 SENSITIVE Sensitive     TRIMETH/SULFA <=20 SENSITIVE Sensitive     AMPICILLIN/SULBACTAM <=2 SENSITIVE Sensitive     PIP/TAZO <=4 SENSITIVE Sensitive ug/mL    * FEW PROTEUS MIRABILIS  Aerobic/Anaerobic Culture w Gram Stain (surgical/deep wound)     Status: None   Collection Time: 04/25/23  2:41 PM   Specimen: Wound; Body Fluid  Result Value Ref Range Status   Specimen Description   Final    WOUND Performed at [facility], [address] [place], [place] [address]    Special Requests RETROPERITONEAL HEMATOMA SPEC A  Final   Gram Stain   Final    FEW WBC PRESENT,BOTH PMN AND MONONUCLEAR NO ORGANISMS SEEN    Culture   Final    RARE ENTEROCOCCUS FAECALIS NO ANAEROBES ISOLATED Performed at [facility], [address] [place], [place] [address]    Report Status 05/02/2023 FINAL  Final   Organism ID, Bacteria ENTEROCOCCUS FAECALIS  Final      Susceptibility   Enterococcus faecalis - MIC*    AMPICILLIN <=2 SENSITIVE Sensitive     VANCOMYCIN 2 SENSITIVE Sensitive     GENTAMICIN SYNERGY SENSITIVE Sensitive     * RARE ENTEROCOCCUS FAECALIS    Coagulation Studies: No results for input(s): "LABPROT", "INR" in the last 72 hours.  Urinalysis: No results for input(s): "COLORURINE", "LABSPEC", "  PHURINE", "GLUCOSEU", "HGBUR", "BILIRUBINUR", "KETONESUR", "PROTEINUR", "UROBILINOGEN", "NITRITE", "LEUKOCYTESUR" in the last 72 hours.  Invalid input(s): "APPERANCEUR"    Imaging: DG Chest Port 1 View Result Date: 05/13/2023 CLINICAL DATA:  Insert x-ray EXAM: PORTABLE CHEST 1 VIEW COMPARISON:  Chest x-ray  05/11/2023, CT chest 04/12/2023 FINDINGS: Right PICC with tip overlying the superior cavoatrial junction. The heart and mediastinal contours are within normal limits. No focal consolidation. No pulmonary edema. Interval increase in moderate left pleural effusion. Trace right pleural effusion. No pneumothorax. No acute osseous abnormality. IMPRESSION: 1.  Interval increase in moderate left pleural effusion. 2. Trace right pleural effusion. Electronically Signed   By: Tish Frederickson M.D.   On: 05/13/2023 10:18   DG Chest Port 1 View Result Date: 05/11/2023 CLINICAL DATA:  Pleural effusion status post thoracentesis. EXAM: PORTABLE CHEST 1 VIEW COMPARISON:  Chest radiograph dated 05/11/2023. FINDINGS: Small bilateral pleural effusions. Interval decrease in the size of left pleural effusion since the prior radiograph. No obvious pneumothorax. There are bibasilar atelectasis or infiltrate. Right-sided PICC in similar position. Stable cardiac silhouette. No acute osseous pathology. IMPRESSION: Small bilateral pleural effusions. Interval decrease in the size of left pleural effusion. No obvious pneumothorax. Electronically Signed   By: Elgie Collard M.D.   On: 05/11/2023 18:02   US THORACENTESIS ASP PLEURAL SPACE W/IMG GUIDE Result Date: 05/11/2023 INDICATION: Patient with congestive heart failure, bilateral pleural effusions, left greater than right. Request for left-sided therapeutic thoracentesis. EXAM: ULTRASOUND GUIDED THERAPEUTIC THORACENTESIS MEDICATIONS: 8 cc of 1% lidocaine COMPLICATIONS: None immediate. PROCEDURE: An ultrasound guided thoracentesis was thoroughly discussed with the patient and questions answered. The benefits, risks, alternatives and complications were also discussed. The patient understands and wishes to proceed with the procedure. Written consent was obtained. Ultrasound was performed to localize and mark an adequate pocket of fluid in the left chest. The area was then prepped and  draped in the normal sterile fashion. 1% Lidocaine was used for local anesthesia. Under ultrasound guidance a 6 Fr Safe-T-Centesis catheter was introduced. Thoracentesis was performed. The catheter was removed and a dressing applied. FINDINGS: A total of approximately 1.4 L of dark red pleural fluid was removed. IMPRESSION: Successful ultrasound guided left thoracentesis yielding 1.4 L of pleural fluid. Performed by Buzzy Han, PA-C Electronically Signed   By: Acquanetta Belling M.D.   On: 05/11/2023 16:41     Medications:      alum & mag hydroxide-simeth  15 mL Oral Once   amoxicillin-clavulanate  1 tablet Oral BID   apixaban  2.5 mg Oral BID   aspirin EC  81 mg Oral Daily   atorvastatin  40 mg Oral Daily   carvedilol  12.5 mg Oral BID WC   Chlorhexidine Gluconate Cloth  6 each Topical Daily   ciprofloxacin  500 mg Oral Q breakfast   clopidogrel  75 mg Oral Daily   ezetimibe  10 mg Oral Daily   feeding supplement  237 mL Oral BID BM   fentaNYL  1 patch Transdermal Q72H   pregabalin  75 mg Oral Daily   sodium chloride flush  10-40 mL Intracatheter Q12H   traZODone  50 mg Oral QHS   acetaminophen, albuterol, benzonatate, oxyCODONE, phenylephrine, sodium chloride flush, witch hazel-glycerin  Assessment/ Plan:  Dawn Bruce is a 77 y.o.  female with medical problems of hypertension, heart failure with preserved ejection fraction, history of pleural effusions, history of diffuse vascular disease including coronary artery disease-history of PCI in LAD stent October 2023, chronic kidney  disease, prediabetes, atrial fibrillation status post cardioversion in 2022 followed by Dr. Alyson Locket at [facility] nephrology. Patient presents to the ED with hypotension and suspected wound infection. She has been admitted for Cellulitis of right groin [L03.314] Cellulitis of right thigh [L03.115]   Acute Kidney Injury on chronic kidney disease stage IIIb with baseline creatinine 1.3 and GFR of 42 on 03/22/23.   Acute kidney injury appears multifactorial, hypotension, IV contrast exposure, and diuretics. Creatinine on admission, 1.17.  Patient experienced hypertension with subsequent hypotension.  IV contrast exposure on 04/22/2023.  Received approximately 2 days of diuresis.    Creatinine improving. Patient cleared to discharge from renal stance. Will need outpatient follow-up in our office at discharge.  Lab Results  Component Value Date   CREATININE 1.65 (H) 05/13/2023   CREATININE 1.94 (H) 05/12/2023   CREATININE 2.45 (H) 05/11/2023    Intake/Output Summary (Last 24 hours) at 05/13/2023 1156 Last data filed at 05/13/2023 0400 Gross per 24 hour  Intake 1002.33 ml  Output 580 ml  Net 422.33 ml   2. Anemia of chronic kidney disease Lab Results  Component Value Date   HGB 10.0 (L) 05/13/2023    Hemoglobin up to 10.0    3.  Hypertension, home regimen includes carvedilol, isosorbide, and valsartan.  Currently receiving carvedilol only. Blood pressure 178/72.   LOS: 21 Dawn Bruce 12/22/202411:56 AM

## 2023-05-13 NOTE — TOC Transition Note (Signed)
Transition of Care [facility]) - Discharge Note   Patient Details  Name: Dawn Bruce MRN: [id number] Date of Birth: [birth year]  [facility] CM/SW Contact:  Liliana Cline, LCSW Phone Number: 05/13/2023, 12:07 PM   Clinical Narrative:    Patient has orders to discharge home today. CSW notified Kandee Keen with [facility] of discharge. CSW asked RN to ensure patient's supplies are sent home with her (from the [facility] dictation area).   Final next level of care: Home w Home Health Services Barriers to Discharge: Barriers Resolved   Patient Goals and CMS Choice            Discharge Placement                       Discharge Plan and Services Additional resources added to the After Visit Summary for                            HH Arranged: PT, OT, RN [facility] Agency: [facility] Date [facility] Agency Contacted: 05/13/23   Representative spoke with at [facility] Agency: Kandee Keen  Social Drivers of Health (SDOH) Interventions SDOH Screenings   Food Insecurity: No Food Insecurity (04/23/2023)  Housing: Low Risk  (05/03/2023)  Transportation Needs: No Transportation Needs (04/23/2023)  Utilities: Not At Risk (04/23/2023)  Alcohol Screen: Low Risk  (12/08/2022)  Depression (PHQ2-9): Low Risk  (12/08/2022)  Financial Resource Strain: Low Risk  (10/13/2022)  Physical Activity: Insufficiently Active (10/13/2022)  Social Connections: Unknown (10/13/2022)  Stress: No Stress Concern Present (10/13/2022)  Tobacco Use: Medium Risk (04/27/2023)     Readmission Risk Interventions     No data to display

## 2023-05-14 ENCOUNTER — Telehealth (INDEPENDENT_AMBULATORY_CARE_PROVIDER_SITE_OTHER): Payer: Self-pay

## 2023-05-14 ENCOUNTER — Telehealth: Payer: Self-pay

## 2023-05-14 DIAGNOSIS — I340 Nonrheumatic mitral (valve) insufficiency: Secondary | ICD-10-CM | POA: Diagnosis not present

## 2023-05-14 DIAGNOSIS — J9811 Atelectasis: Secondary | ICD-10-CM | POA: Diagnosis not present

## 2023-05-14 DIAGNOSIS — I130 Hypertensive heart and chronic kidney disease with heart failure and stage 1 through stage 4 chronic kidney disease, or unspecified chronic kidney disease: Secondary | ICD-10-CM | POA: Diagnosis not present

## 2023-05-14 DIAGNOSIS — I480 Paroxysmal atrial fibrillation: Secondary | ICD-10-CM | POA: Diagnosis not present

## 2023-05-14 DIAGNOSIS — I252 Old myocardial infarction: Secondary | ICD-10-CM | POA: Diagnosis not present

## 2023-05-14 DIAGNOSIS — I959 Hypotension, unspecified: Secondary | ICD-10-CM | POA: Diagnosis not present

## 2023-05-14 DIAGNOSIS — B9620 Unspecified Escherichia coli [E. coli] as the cause of diseases classified elsewhere: Secondary | ICD-10-CM | POA: Diagnosis not present

## 2023-05-14 DIAGNOSIS — R6520 Severe sepsis without septic shock: Secondary | ICD-10-CM | POA: Diagnosis not present

## 2023-05-14 DIAGNOSIS — I5023 Acute on chronic systolic (congestive) heart failure: Secondary | ICD-10-CM | POA: Diagnosis not present

## 2023-05-14 DIAGNOSIS — T8141XA Infection following a procedure, superficial incisional surgical site, initial encounter: Secondary | ICD-10-CM | POA: Diagnosis not present

## 2023-05-14 DIAGNOSIS — A419 Sepsis, unspecified organism: Secondary | ICD-10-CM | POA: Diagnosis not present

## 2023-05-14 DIAGNOSIS — D696 Thrombocytopenia, unspecified: Secondary | ICD-10-CM | POA: Diagnosis not present

## 2023-05-14 DIAGNOSIS — B964 Proteus (mirabilis) (morganii) as the cause of diseases classified elsewhere: Secondary | ICD-10-CM | POA: Diagnosis not present

## 2023-05-14 DIAGNOSIS — I5032 Chronic diastolic (congestive) heart failure: Secondary | ICD-10-CM | POA: Diagnosis not present

## 2023-05-14 DIAGNOSIS — N179 Acute kidney failure, unspecified: Secondary | ICD-10-CM | POA: Diagnosis not present

## 2023-05-14 DIAGNOSIS — D631 Anemia in chronic kidney disease: Secondary | ICD-10-CM | POA: Diagnosis not present

## 2023-05-14 DIAGNOSIS — T8131XA Disruption of external operation (surgical) wound, not elsewhere classified, initial encounter: Secondary | ICD-10-CM | POA: Diagnosis not present

## 2023-05-14 DIAGNOSIS — T8144XA Sepsis following a procedure, initial encounter: Secondary | ICD-10-CM | POA: Diagnosis not present

## 2023-05-14 DIAGNOSIS — N1831 Chronic kidney disease, stage 3a: Secondary | ICD-10-CM | POA: Diagnosis not present

## 2023-05-14 DIAGNOSIS — E785 Hyperlipidemia, unspecified: Secondary | ICD-10-CM | POA: Diagnosis not present

## 2023-05-14 DIAGNOSIS — I2510 Atherosclerotic heart disease of native coronary artery without angina pectoris: Secondary | ICD-10-CM | POA: Diagnosis not present

## 2023-05-14 DIAGNOSIS — G2581 Restless legs syndrome: Secondary | ICD-10-CM | POA: Diagnosis not present

## 2023-05-14 DIAGNOSIS — L03314 Cellulitis of groin: Secondary | ICD-10-CM | POA: Diagnosis not present

## 2023-05-14 DIAGNOSIS — B952 Enterococcus as the cause of diseases classified elsewhere: Secondary | ICD-10-CM | POA: Diagnosis not present

## 2023-05-14 DIAGNOSIS — G894 Chronic pain syndrome: Secondary | ICD-10-CM | POA: Diagnosis not present

## 2023-05-14 NOTE — Transitions of Care (Post Inpatient/ED Visit) (Signed)
05/14/2023  Name: Dawn Bruce MRN: [id number] DOB: 30-Mar-1946  Today's TOC FU Call Status: Today's TOC FU Call Status:: Successful TOC FU Call Completed TOC FU Call Complete Date: 05/14/23 Patient's Name and Date of Birth confirmed.  Transition Care Management Follow-up Telephone Call Date of Discharge: 05/13/23 Discharge Facility: [facility]) Type of Discharge: Inpatient Admission Primary Inpatient Discharge Diagnosis:: Cellulitis of the groin How have you been since you were released from the hospital?: Same Any questions or concerns?: No  Items Reviewed: Did you receive and understand the discharge instructions provided?: Yes Medications obtained,verified, and reconciled?: Yes (Medications Reviewed) Any new allergies since your discharge?: No Dietary orders reviewed?: Yes Type of Diet Ordered:: low sodium heart healthy Do you have support at home?: Yes People in Home: spouse Name of Support/Comfort Primary Source: Brett Canales, spouse  Medications Reviewed Today: Medications Reviewed Today     Reviewed by Redge Gainer, RN (Case Manager) on 05/14/23 at 1430  Med List Status: <None>   Medication Order Taking? Sig Documenting Provider Last Dose Status Informant  amoxicillin-clavulanate (AUGMENTIN) 500-125 MG tablet [id number]  Take 1 tablet by mouth 2 (two) times daily for 12 days. Lucile Shutters, MD  Active   apixaban (ELIQUIS) 2.5 MG TABS tablet [id number] No Take 2.5 mg by mouth 2 (two) times daily. [provider] 04/22/2023 0900 Active Self  atorvastatin (LIPITOR) 40 MG tablet [id number]  Take 1 tablet (40 mg total) by mouth daily. Lucile Shutters, MD  Active   carvedilol (COREG) 12.5 MG tablet [id number] No Take 12.5 mg by mouth 2 (two) times daily with a meal. [provider] 04/22/2023 Active Self  ciprofloxacin (CIPRO) 500 MG tablet [id number]  Take 1 tablet (500 mg total) by mouth daily with breakfast for 12 days. Lucile Shutters, MD  Active   clopidogrel (PLAVIX) 75 MG tablet [id number] No Take 1 tablet (75 mg total) by mouth daily. Erasmo Downer, MD 04/22/2023 0900 Active Self  ezetimibe (ZETIA) 10 MG tablet [id number] No TAKE 1 TABLET(10 MG) BY MOUTH DAILY Erasmo Downer, MD 04/22/2023 0900 Active Self  fentaNYL (DURAGESIC) 25 MCG/HR [id number] No Place 2 patches onto the skin every 3 (three) days. Debera Lat, PA-C 04/20/2023 Unknown Active Self           Med Note Shara Blazing Apr 22, 2023 10:42 PM) Pt uses 3 patches  isosorbide mononitrate (IMDUR) 60 MG 24 hr tablet [id number] No Take 1 tablet (60 mg total) by mouth daily. Charise Killian, MD 04/22/2023 0900 Active Self  oxyCODONE (OXY IR/ROXICODONE) 5 MG immediate release tablet [id number]  Take 1 tablet (5 mg total) by mouth every 6 (six) hours as needed for up to 5 days for moderate pain (pain score 4-6). Lucile Shutters, MD  Active   pantoprazole (PROTONIX) 40 MG tablet [id number] No Take 1 tablet (40 mg total) by mouth daily. Charise Killian, MD 04/22/2023 0900 Active Self  pregabalin (LYRICA) 150 MG capsule [id number] No Take 1 capsule (150 mg total) by mouth 2 (two) times daily. Erasmo Downer, MD 04/22/2023 0900 Active Self  shark liver oil-cocoa butter (PREPARATION H) 0.25-3-85.5 % suppository [id number] No Place 1 suppository rectally as needed for hemorrhoids. [provider] prn prn Active Self  traZODone (DESYREL) 50 MG tablet [id number] No Take 0.5-1 tablets (25-50 mg total) by mouth at bedtime as needed for sleep. Erasmo Downer, MD prn prn Active Self  Home Care and Equipment/Supplies: Were Home Health Services Ordered?: Yes Name of Home Health Agency:: [facility] Has Agency set up a time to come to your home?: Yes First Home Health Visit Date: 05/14/23 Any new equipment or medical supplies ordered?: No  Functional Questionnaire: Do you need assistance with bathing/showering or dressing?:  No Do you need assistance with meal preparation?: No Do you need assistance with eating?: No Do you have difficulty maintaining continence: No Do you need assistance with getting out of bed/getting out of a chair/moving?: No Do you have difficulty managing or taking your medications?: No  Follow up appointments reviewed: PCP Follow-up appointment confirmed?: Yes Date of PCP follow-up appointment?: 05/08/23 Follow-up Provider: Shirlee Latch Specialist [facility] Follow-up appointment confirmed?: Yes Date of Specialist follow-up appointment?: 05/24/23 Follow-Up Specialty Provider:: J. Nadyne Coombes Do you need transportation to your follow-up appointment?: No Do you understand care options if your condition(s) worsen?: Yes-patient verbalized understanding  SDOH Interventions Today    Flowsheet Row Most Recent Value  SDOH Interventions   Food Insecurity Interventions Intervention Not Indicated  Housing Interventions Intervention Not Indicated  Transportation Interventions Intervention Not Indicated  Utilities Interventions Intervention Not Indicated      Interventions Today    Flowsheet Row Most Recent Value  General Interventions   General Interventions Discussed/Reviewed General Interventions Discussed  Pharmacy Interventions   Pharmacy Dicussed/Reviewed Medications and their functions       Outreach completed to the patient today. She was in the hospital late November for a NSTEMI and had a heart cath completed. The incision site developed infection and she was re-admitted the first of December. She now has a wound vac to her groin area. She continues on home ABX and she has home health care to come and change her wound vac. She reports minimal pain and is taking her pain medication when the nurse comes to change the vac. She reports no other needs at this time.   The patient has been provided with contact information for the care management team and has been advised to call  with any health-related questions or concerns. The patient verbalized understanding with current POC. The patient is directed to their insurance card regarding availability of benefits coverage.  Deidre Ala, RN Medical illustrator [facility] [phone]

## 2023-05-14 NOTE — Telephone Encounter (Signed)
Archie Patten a home health nurse with [facility] home health left a message requesting verbal orders for nursing visit two times a week for 9 weeks for wound vac changes. Message was left on Tonya voicemail that orders fine.

## 2023-05-15 DIAGNOSIS — L03314 Cellulitis of groin: Secondary | ICD-10-CM | POA: Diagnosis not present

## 2023-05-15 DIAGNOSIS — N1831 Chronic kidney disease, stage 3a: Secondary | ICD-10-CM | POA: Diagnosis not present

## 2023-05-15 DIAGNOSIS — I130 Hypertensive heart and chronic kidney disease with heart failure and stage 1 through stage 4 chronic kidney disease, or unspecified chronic kidney disease: Secondary | ICD-10-CM | POA: Diagnosis not present

## 2023-05-15 DIAGNOSIS — T8131XA Disruption of external operation (surgical) wound, not elsewhere classified, initial encounter: Secondary | ICD-10-CM | POA: Diagnosis not present

## 2023-05-15 DIAGNOSIS — T8144XA Sepsis following a procedure, initial encounter: Secondary | ICD-10-CM | POA: Diagnosis not present

## 2023-05-15 DIAGNOSIS — R6520 Severe sepsis without septic shock: Secondary | ICD-10-CM | POA: Diagnosis not present

## 2023-05-15 DIAGNOSIS — D631 Anemia in chronic kidney disease: Secondary | ICD-10-CM | POA: Diagnosis not present

## 2023-05-15 DIAGNOSIS — I2510 Atherosclerotic heart disease of native coronary artery without angina pectoris: Secondary | ICD-10-CM | POA: Diagnosis not present

## 2023-05-15 DIAGNOSIS — B952 Enterococcus as the cause of diseases classified elsewhere: Secondary | ICD-10-CM | POA: Diagnosis not present

## 2023-05-15 DIAGNOSIS — A419 Sepsis, unspecified organism: Secondary | ICD-10-CM | POA: Diagnosis not present

## 2023-05-15 DIAGNOSIS — G894 Chronic pain syndrome: Secondary | ICD-10-CM | POA: Diagnosis not present

## 2023-05-15 DIAGNOSIS — T8141XA Infection following a procedure, superficial incisional surgical site, initial encounter: Secondary | ICD-10-CM | POA: Diagnosis not present

## 2023-05-15 DIAGNOSIS — B964 Proteus (mirabilis) (morganii) as the cause of diseases classified elsewhere: Secondary | ICD-10-CM | POA: Diagnosis not present

## 2023-05-15 DIAGNOSIS — I5032 Chronic diastolic (congestive) heart failure: Secondary | ICD-10-CM | POA: Diagnosis not present

## 2023-05-15 DIAGNOSIS — B9620 Unspecified Escherichia coli [E. coli] as the cause of diseases classified elsewhere: Secondary | ICD-10-CM | POA: Diagnosis not present

## 2023-05-15 DIAGNOSIS — I5023 Acute on chronic systolic (congestive) heart failure: Secondary | ICD-10-CM | POA: Diagnosis not present

## 2023-05-17 ENCOUNTER — Telehealth: Payer: Self-pay | Admitting: Family Medicine

## 2023-05-17 DIAGNOSIS — R6520 Severe sepsis without septic shock: Secondary | ICD-10-CM | POA: Diagnosis not present

## 2023-05-17 DIAGNOSIS — T8141XA Infection following a procedure, superficial incisional surgical site, initial encounter: Secondary | ICD-10-CM | POA: Diagnosis not present

## 2023-05-17 DIAGNOSIS — B952 Enterococcus as the cause of diseases classified elsewhere: Secondary | ICD-10-CM | POA: Diagnosis not present

## 2023-05-17 DIAGNOSIS — T8131XA Disruption of external operation (surgical) wound, not elsewhere classified, initial encounter: Secondary | ICD-10-CM | POA: Diagnosis not present

## 2023-05-17 DIAGNOSIS — I130 Hypertensive heart and chronic kidney disease with heart failure and stage 1 through stage 4 chronic kidney disease, or unspecified chronic kidney disease: Secondary | ICD-10-CM | POA: Diagnosis not present

## 2023-05-17 DIAGNOSIS — N1831 Chronic kidney disease, stage 3a: Secondary | ICD-10-CM | POA: Diagnosis not present

## 2023-05-17 DIAGNOSIS — T8189XA Other complications of procedures, not elsewhere classified, initial encounter: Secondary | ICD-10-CM | POA: Diagnosis not present

## 2023-05-17 DIAGNOSIS — I2510 Atherosclerotic heart disease of native coronary artery without angina pectoris: Secondary | ICD-10-CM | POA: Diagnosis not present

## 2023-05-17 DIAGNOSIS — I5023 Acute on chronic systolic (congestive) heart failure: Secondary | ICD-10-CM | POA: Diagnosis not present

## 2023-05-17 DIAGNOSIS — T8144XA Sepsis following a procedure, initial encounter: Secondary | ICD-10-CM | POA: Diagnosis not present

## 2023-05-17 DIAGNOSIS — D631 Anemia in chronic kidney disease: Secondary | ICD-10-CM | POA: Diagnosis not present

## 2023-05-17 DIAGNOSIS — A419 Sepsis, unspecified organism: Secondary | ICD-10-CM | POA: Diagnosis not present

## 2023-05-17 DIAGNOSIS — G894 Chronic pain syndrome: Secondary | ICD-10-CM | POA: Diagnosis not present

## 2023-05-17 DIAGNOSIS — B964 Proteus (mirabilis) (morganii) as the cause of diseases classified elsewhere: Secondary | ICD-10-CM | POA: Diagnosis not present

## 2023-05-17 DIAGNOSIS — B9620 Unspecified Escherichia coli [E. coli] as the cause of diseases classified elsewhere: Secondary | ICD-10-CM | POA: Diagnosis not present

## 2023-05-17 DIAGNOSIS — I5032 Chronic diastolic (congestive) heart failure: Secondary | ICD-10-CM | POA: Diagnosis not present

## 2023-05-17 DIAGNOSIS — L03314 Cellulitis of groin: Secondary | ICD-10-CM | POA: Diagnosis not present

## 2023-05-17 DIAGNOSIS — S31103A Unspecified open wound of abdominal wall, right lower quadrant without penetration into peritoneal cavity, initial encounter: Secondary | ICD-10-CM | POA: Diagnosis not present

## 2023-05-17 NOTE — Telephone Encounter (Signed)
Lele with [facility] called requesting an order for a change of wound care from the regular wound vac dressing to the peel and stick dressing. This is for the wound she has on her right hip. Please assist patient further.

## 2023-05-18 ENCOUNTER — Other Ambulatory Visit: Payer: Self-pay | Admitting: Emergency Medicine

## 2023-05-18 ENCOUNTER — Other Ambulatory Visit: Payer: Self-pay | Admitting: Pulmonary Disease

## 2023-05-18 ENCOUNTER — Ambulatory Visit
Admission: RE | Admit: 2023-05-18 | Discharge: 2023-05-18 | Disposition: A | Discharge status: Home / Self Care | Payer: Medicare Other | Source: Ambulatory Visit | Attending: Pulmonary Disease | Admitting: Pulmonary Disease

## 2023-05-18 ENCOUNTER — Telehealth: Payer: Self-pay | Admitting: Family Medicine

## 2023-05-18 ENCOUNTER — Ambulatory Visit
Admission: RE | Admit: 2023-05-18 | Discharge: 2023-05-18 | Disposition: A | Discharge status: Home / Self Care | Payer: Medicare Other | Source: Ambulatory Visit | Attending: Emergency Medicine

## 2023-05-18 DIAGNOSIS — I509 Heart failure, unspecified: Secondary | ICD-10-CM | POA: Diagnosis not present

## 2023-05-18 DIAGNOSIS — J90 Pleural effusion, not elsewhere classified: Secondary | ICD-10-CM

## 2023-05-18 DIAGNOSIS — R846 Abnormal cytological findings in specimens from respiratory organs and thorax: Secondary | ICD-10-CM | POA: Diagnosis not present

## 2023-05-18 DIAGNOSIS — Z9889 Other specified postprocedural states: Secondary | ICD-10-CM

## 2023-05-18 DIAGNOSIS — R918 Other nonspecific abnormal finding of lung field: Secondary | ICD-10-CM | POA: Diagnosis not present

## 2023-05-18 DIAGNOSIS — Z48813 Encounter for surgical aftercare following surgery on the respiratory system: Secondary | ICD-10-CM | POA: Diagnosis not present

## 2023-05-18 LAB — BODY FLUID CELL COUNT WITH DIFFERENTIAL
Eos, Fluid: 1 %
Lymphs, Fluid: 30 %
Monocyte-Macrophage-Serous Fluid: 25 %
Neutrophil Count, Fluid: 44 %
Total Nucleated Cell Count, Fluid: 234 uL

## 2023-05-18 LAB — GLUCOSE, PLEURAL OR PERITONEAL FLUID: Glucose, Fluid: 115 mg/dL

## 2023-05-18 MED ORDER — LIDOCAINE HCL (PF) 1 % IJ SOLN
10.0000 mL | Freq: Once | INTRAMUSCULAR | Status: AC
  Administered 2023-05-18: 10 mL via INTRADERMAL
  Filled 2023-05-18: qty 10

## 2023-05-18 NOTE — Telephone Encounter (Signed)
Verbals given  

## 2023-05-18 NOTE — Telephone Encounter (Signed)
Dawn Bruce with [facility] stated she needs to get another order for the home health assessment as patient has another appt at [facility] today.

## 2023-05-18 NOTE — Procedures (Signed)
PROCEDURE SUMMARY:  Successful image-guided right thoracentesis. Yielded 1.4 L liters of clear yellow fluid. Patient tolerated procedure well. EBL: Zero No immediate complications.  Specimen was not sent for labs. Post procedure CXR shows no pneumothorax.  Please see imaging section of Epic for full dictation.  Villa Herb PA-C 05/18/2023 3:32 PM

## 2023-05-19 DIAGNOSIS — T8131XA Disruption of external operation (surgical) wound, not elsewhere classified, initial encounter: Secondary | ICD-10-CM | POA: Diagnosis not present

## 2023-05-19 DIAGNOSIS — I5023 Acute on chronic systolic (congestive) heart failure: Secondary | ICD-10-CM | POA: Diagnosis not present

## 2023-05-19 DIAGNOSIS — L03314 Cellulitis of groin: Secondary | ICD-10-CM | POA: Diagnosis not present

## 2023-05-19 DIAGNOSIS — T8144XA Sepsis following a procedure, initial encounter: Secondary | ICD-10-CM | POA: Diagnosis not present

## 2023-05-19 DIAGNOSIS — G894 Chronic pain syndrome: Secondary | ICD-10-CM | POA: Diagnosis not present

## 2023-05-19 DIAGNOSIS — N1831 Chronic kidney disease, stage 3a: Secondary | ICD-10-CM | POA: Diagnosis not present

## 2023-05-19 DIAGNOSIS — B952 Enterococcus as the cause of diseases classified elsewhere: Secondary | ICD-10-CM | POA: Diagnosis not present

## 2023-05-19 DIAGNOSIS — I130 Hypertensive heart and chronic kidney disease with heart failure and stage 1 through stage 4 chronic kidney disease, or unspecified chronic kidney disease: Secondary | ICD-10-CM | POA: Diagnosis not present

## 2023-05-19 DIAGNOSIS — A419 Sepsis, unspecified organism: Secondary | ICD-10-CM | POA: Diagnosis not present

## 2023-05-19 DIAGNOSIS — I5032 Chronic diastolic (congestive) heart failure: Secondary | ICD-10-CM | POA: Diagnosis not present

## 2023-05-19 DIAGNOSIS — T8141XA Infection following a procedure, superficial incisional surgical site, initial encounter: Secondary | ICD-10-CM | POA: Diagnosis not present

## 2023-05-19 DIAGNOSIS — D631 Anemia in chronic kidney disease: Secondary | ICD-10-CM | POA: Diagnosis not present

## 2023-05-19 DIAGNOSIS — B964 Proteus (mirabilis) (morganii) as the cause of diseases classified elsewhere: Secondary | ICD-10-CM | POA: Diagnosis not present

## 2023-05-19 DIAGNOSIS — R6520 Severe sepsis without septic shock: Secondary | ICD-10-CM | POA: Diagnosis not present

## 2023-05-19 DIAGNOSIS — I2510 Atherosclerotic heart disease of native coronary artery without angina pectoris: Secondary | ICD-10-CM | POA: Diagnosis not present

## 2023-05-19 DIAGNOSIS — B9620 Unspecified Escherichia coli [E. coli] as the cause of diseases classified elsewhere: Secondary | ICD-10-CM | POA: Diagnosis not present

## 2023-05-20 DIAGNOSIS — I130 Hypertensive heart and chronic kidney disease with heart failure and stage 1 through stage 4 chronic kidney disease, or unspecified chronic kidney disease: Secondary | ICD-10-CM | POA: Diagnosis not present

## 2023-05-20 DIAGNOSIS — B952 Enterococcus as the cause of diseases classified elsewhere: Secondary | ICD-10-CM | POA: Diagnosis not present

## 2023-05-20 DIAGNOSIS — A419 Sepsis, unspecified organism: Secondary | ICD-10-CM | POA: Diagnosis not present

## 2023-05-20 DIAGNOSIS — G894 Chronic pain syndrome: Secondary | ICD-10-CM | POA: Diagnosis not present

## 2023-05-20 DIAGNOSIS — T8144XA Sepsis following a procedure, initial encounter: Secondary | ICD-10-CM | POA: Diagnosis not present

## 2023-05-20 DIAGNOSIS — T8141XA Infection following a procedure, superficial incisional surgical site, initial encounter: Secondary | ICD-10-CM | POA: Diagnosis not present

## 2023-05-20 DIAGNOSIS — L03314 Cellulitis of groin: Secondary | ICD-10-CM | POA: Diagnosis not present

## 2023-05-20 DIAGNOSIS — I2510 Atherosclerotic heart disease of native coronary artery without angina pectoris: Secondary | ICD-10-CM | POA: Diagnosis not present

## 2023-05-20 DIAGNOSIS — B9620 Unspecified Escherichia coli [E. coli] as the cause of diseases classified elsewhere: Secondary | ICD-10-CM | POA: Diagnosis not present

## 2023-05-20 DIAGNOSIS — B964 Proteus (mirabilis) (morganii) as the cause of diseases classified elsewhere: Secondary | ICD-10-CM | POA: Diagnosis not present

## 2023-05-20 DIAGNOSIS — D631 Anemia in chronic kidney disease: Secondary | ICD-10-CM | POA: Diagnosis not present

## 2023-05-20 DIAGNOSIS — R6520 Severe sepsis without septic shock: Secondary | ICD-10-CM | POA: Diagnosis not present

## 2023-05-20 DIAGNOSIS — I5023 Acute on chronic systolic (congestive) heart failure: Secondary | ICD-10-CM | POA: Diagnosis not present

## 2023-05-20 DIAGNOSIS — T8131XA Disruption of external operation (surgical) wound, not elsewhere classified, initial encounter: Secondary | ICD-10-CM | POA: Diagnosis not present

## 2023-05-20 DIAGNOSIS — N1831 Chronic kidney disease, stage 3a: Secondary | ICD-10-CM | POA: Diagnosis not present

## 2023-05-20 DIAGNOSIS — I5032 Chronic diastolic (congestive) heart failure: Secondary | ICD-10-CM | POA: Diagnosis not present

## 2023-05-21 DIAGNOSIS — G894 Chronic pain syndrome: Secondary | ICD-10-CM | POA: Diagnosis not present

## 2023-05-21 DIAGNOSIS — B964 Proteus (mirabilis) (morganii) as the cause of diseases classified elsewhere: Secondary | ICD-10-CM

## 2023-05-21 DIAGNOSIS — B9620 Unspecified Escherichia coli [E. coli] as the cause of diseases classified elsewhere: Secondary | ICD-10-CM

## 2023-05-21 DIAGNOSIS — I517 Cardiomegaly: Secondary | ICD-10-CM | POA: Diagnosis not present

## 2023-05-21 DIAGNOSIS — F411 Generalized anxiety disorder: Secondary | ICD-10-CM | POA: Diagnosis not present

## 2023-05-21 DIAGNOSIS — Z79899 Other long term (current) drug therapy: Secondary | ICD-10-CM | POA: Diagnosis not present

## 2023-05-21 DIAGNOSIS — Z91119 Patient's noncompliance with dietary regimen due to unspecified reason: Secondary | ICD-10-CM | POA: Diagnosis not present

## 2023-05-21 DIAGNOSIS — E785 Hyperlipidemia, unspecified: Secondary | ICD-10-CM | POA: Diagnosis not present

## 2023-05-21 DIAGNOSIS — I255 Ischemic cardiomyopathy: Secondary | ICD-10-CM | POA: Diagnosis not present

## 2023-05-21 DIAGNOSIS — L03314 Cellulitis of groin: Secondary | ICD-10-CM

## 2023-05-21 DIAGNOSIS — I5023 Acute on chronic systolic (congestive) heart failure: Secondary | ICD-10-CM

## 2023-05-21 DIAGNOSIS — T8131XA Disruption of external operation (surgical) wound, not elsewhere classified, initial encounter: Secondary | ICD-10-CM | POA: Diagnosis not present

## 2023-05-21 DIAGNOSIS — A419 Sepsis, unspecified organism: Secondary | ICD-10-CM | POA: Diagnosis not present

## 2023-05-21 DIAGNOSIS — N183 Chronic kidney disease, stage 3 unspecified: Secondary | ICD-10-CM | POA: Diagnosis not present

## 2023-05-21 DIAGNOSIS — J9811 Atelectasis: Secondary | ICD-10-CM | POA: Diagnosis not present

## 2023-05-21 DIAGNOSIS — Z79891 Long term (current) use of opiate analgesic: Secondary | ICD-10-CM | POA: Diagnosis not present

## 2023-05-21 DIAGNOSIS — I5032 Chronic diastolic (congestive) heart failure: Secondary | ICD-10-CM

## 2023-05-21 DIAGNOSIS — I2510 Atherosclerotic heart disease of native coronary artery without angina pectoris: Secondary | ICD-10-CM | POA: Diagnosis not present

## 2023-05-21 DIAGNOSIS — R6520 Severe sepsis without septic shock: Secondary | ICD-10-CM

## 2023-05-21 DIAGNOSIS — J918 Pleural effusion in other conditions classified elsewhere: Secondary | ICD-10-CM | POA: Diagnosis not present

## 2023-05-21 DIAGNOSIS — B952 Enterococcus as the cause of diseases classified elsewhere: Secondary | ICD-10-CM

## 2023-05-21 DIAGNOSIS — Z792 Long term (current) use of antibiotics: Secondary | ICD-10-CM | POA: Diagnosis not present

## 2023-05-21 DIAGNOSIS — G4700 Insomnia, unspecified: Secondary | ICD-10-CM | POA: Diagnosis not present

## 2023-05-21 DIAGNOSIS — Z955 Presence of coronary angioplasty implant and graft: Secondary | ICD-10-CM | POA: Diagnosis not present

## 2023-05-21 DIAGNOSIS — M4800 Spinal stenosis, site unspecified: Secondary | ICD-10-CM | POA: Diagnosis not present

## 2023-05-21 DIAGNOSIS — Z7902 Long term (current) use of antithrombotics/antiplatelets: Secondary | ICD-10-CM | POA: Diagnosis not present

## 2023-05-21 DIAGNOSIS — I4819 Other persistent atrial fibrillation: Secondary | ICD-10-CM | POA: Diagnosis not present

## 2023-05-21 DIAGNOSIS — I130 Hypertensive heart and chronic kidney disease with heart failure and stage 1 through stage 4 chronic kidney disease, or unspecified chronic kidney disease: Secondary | ICD-10-CM | POA: Diagnosis not present

## 2023-05-21 DIAGNOSIS — I5043 Acute on chronic combined systolic (congestive) and diastolic (congestive) heart failure: Secondary | ICD-10-CM | POA: Diagnosis not present

## 2023-05-21 DIAGNOSIS — K219 Gastro-esophageal reflux disease without esophagitis: Secondary | ICD-10-CM | POA: Diagnosis not present

## 2023-05-21 DIAGNOSIS — E8809 Other disorders of plasma-protein metabolism, not elsewhere classified: Secondary | ICD-10-CM | POA: Diagnosis not present

## 2023-05-21 DIAGNOSIS — Z87891 Personal history of nicotine dependence: Secondary | ICD-10-CM | POA: Diagnosis not present

## 2023-05-21 DIAGNOSIS — R0600 Dyspnea, unspecified: Secondary | ICD-10-CM | POA: Diagnosis not present

## 2023-05-21 DIAGNOSIS — T8141XA Infection following a procedure, superficial incisional surgical site, initial encounter: Secondary | ICD-10-CM | POA: Diagnosis not present

## 2023-05-21 DIAGNOSIS — J90 Pleural effusion, not elsewhere classified: Secondary | ICD-10-CM | POA: Diagnosis not present

## 2023-05-21 DIAGNOSIS — T8144XA Sepsis following a procedure, initial encounter: Secondary | ICD-10-CM | POA: Diagnosis not present

## 2023-05-21 DIAGNOSIS — I428 Other cardiomyopathies: Secondary | ICD-10-CM | POA: Diagnosis not present

## 2023-05-21 DIAGNOSIS — Z7901 Long term (current) use of anticoagulants: Secondary | ICD-10-CM | POA: Diagnosis not present

## 2023-05-21 DIAGNOSIS — I672 Cerebral atherosclerosis: Secondary | ICD-10-CM | POA: Diagnosis not present

## 2023-05-21 DIAGNOSIS — Z66 Do not resuscitate: Secondary | ICD-10-CM | POA: Diagnosis not present

## 2023-05-22 DIAGNOSIS — R0600 Dyspnea, unspecified: Secondary | ICD-10-CM | POA: Diagnosis not present

## 2023-05-22 DIAGNOSIS — I2510 Atherosclerotic heart disease of native coronary artery without angina pectoris: Secondary | ICD-10-CM | POA: Diagnosis not present

## 2023-05-22 DIAGNOSIS — J90 Pleural effusion, not elsewhere classified: Secondary | ICD-10-CM | POA: Diagnosis not present

## 2023-05-22 LAB — BODY FLUID CULTURE W GRAM STAIN: Gram Stain: NONE SEEN

## 2023-05-22 LAB — CYTOLOGY - NON PAP

## 2023-05-24 ENCOUNTER — Inpatient Hospital Stay: Payer: Medicare Other | Admitting: Infectious Diseases

## 2023-05-28 ENCOUNTER — Telehealth: Payer: Self-pay

## 2023-05-28 DIAGNOSIS — I130 Hypertensive heart and chronic kidney disease with heart failure and stage 1 through stage 4 chronic kidney disease, or unspecified chronic kidney disease: Secondary | ICD-10-CM | POA: Diagnosis not present

## 2023-05-28 DIAGNOSIS — R6520 Severe sepsis without septic shock: Secondary | ICD-10-CM | POA: Diagnosis not present

## 2023-05-28 DIAGNOSIS — B964 Proteus (mirabilis) (morganii) as the cause of diseases classified elsewhere: Secondary | ICD-10-CM | POA: Diagnosis not present

## 2023-05-28 DIAGNOSIS — T8144XA Sepsis following a procedure, initial encounter: Secondary | ICD-10-CM | POA: Diagnosis not present

## 2023-05-28 DIAGNOSIS — A419 Sepsis, unspecified organism: Secondary | ICD-10-CM | POA: Diagnosis not present

## 2023-05-28 DIAGNOSIS — N1831 Chronic kidney disease, stage 3a: Secondary | ICD-10-CM | POA: Diagnosis not present

## 2023-05-28 DIAGNOSIS — T8141XA Infection following a procedure, superficial incisional surgical site, initial encounter: Secondary | ICD-10-CM | POA: Diagnosis not present

## 2023-05-28 DIAGNOSIS — B952 Enterococcus as the cause of diseases classified elsewhere: Secondary | ICD-10-CM | POA: Diagnosis not present

## 2023-05-28 DIAGNOSIS — I5032 Chronic diastolic (congestive) heart failure: Secondary | ICD-10-CM | POA: Diagnosis not present

## 2023-05-28 DIAGNOSIS — I5023 Acute on chronic systolic (congestive) heart failure: Secondary | ICD-10-CM | POA: Diagnosis not present

## 2023-05-28 DIAGNOSIS — L03314 Cellulitis of groin: Secondary | ICD-10-CM | POA: Diagnosis not present

## 2023-05-28 DIAGNOSIS — G894 Chronic pain syndrome: Secondary | ICD-10-CM | POA: Diagnosis not present

## 2023-05-28 DIAGNOSIS — B9620 Unspecified Escherichia coli [E. coli] as the cause of diseases classified elsewhere: Secondary | ICD-10-CM | POA: Diagnosis not present

## 2023-05-28 DIAGNOSIS — D631 Anemia in chronic kidney disease: Secondary | ICD-10-CM | POA: Diagnosis not present

## 2023-05-28 DIAGNOSIS — T8131XA Disruption of external operation (surgical) wound, not elsewhere classified, initial encounter: Secondary | ICD-10-CM | POA: Diagnosis not present

## 2023-05-28 DIAGNOSIS — I2510 Atherosclerotic heart disease of native coronary artery without angina pectoris: Secondary | ICD-10-CM | POA: Diagnosis not present

## 2023-05-28 NOTE — Patient Outreach (Signed)
 Care Management  Transitions of Care Program Transitions of Care Post-discharge Initial   05/28/2023 Name: Dawn Bruce MRN: [id number] DOB: 11-Jul-1945  Subjective: Dawn Bruce is a 78 y.o. year old female who is a primary care patient of Bacigalupo, Jon HERO, MD. The Care Management team Engaged with patient Engaged with patient by telephone to assess and address transitions of care needs.   Consent to Services:  Patient was given information about care management services, agreed to services, and gave verbal consent to participate.   Assessment:  Date of Discharge: 05/26/23 Discharge Facility: Other ([facility]) Type of Discharge: Inpatient Admission Primary Inpatient Discharge Diagnosis:: Pleural Effusion  SDOH Interventions    Flowsheet Row Telephone from 05/28/2023 in [place] POPULATION HEALTH DEPARTMENT Telephone from 05/14/2023 in [place] POPULATION HEALTH DEPARTMENT Care Coordination from 06/20/2022 in [facility] Cardiac Rehab from 04/24/2022 in [facility] Cardiac and Pulmonary Rehab Clinical Support from 04/20/2020 in [facility]  SDOH Interventions       Food Insecurity Interventions Intervention Not Indicated Intervention Not Indicated -- -- --  Housing Interventions Intervention Not Indicated Intervention Not Indicated -- -- --  Transportation Interventions Intervention Not Indicated Intervention Not Indicated Intervention Not Indicated -- --  Utilities Interventions Intervention Not Indicated Intervention Not Indicated -- -- --  Depression Interventions/Treatment  -- -- -- PHQ2-9 Score <4 Follow-up Not Indicated --  Financial Strain Interventions -- -- Intervention Not Indicated -- --  Physical Activity Interventions -- -- -- -- --  FLORIAN.FRENCH minutes]        Goals Addressed             This Visit's Progress    TOC Care Plan       Current Barriers:  Chronic Disease Management support and education needs related to CHF and  COPD   RNCM Clinical Goal(s):  Patient will work with the Care Management team over the next 30 days to address Transition of Care Barriers: Medication access Medication Management Diet/Nutrition/Food Resources Support at home Provider appointments Home Health services Functional/Safety verbalize understanding of plan for management of CHF and COPD as evidenced by No re-admissions to the hospital in the next 30 days take all medications exactly as prescribed and will call provider for medication related questions as evidenced by verbal feedback each visit  through collaboration with RN Care manager, provider, and care team.   Interventions: Evaluation of current treatment plan related to  self management and patient's adherence to plan as established by provider   Heart Failure Interventions:  (Status:  New goal.) Short Term Goal Provided education on low sodium diet Assessed need for readable accurate scales in home Advised patient to weigh each morning after emptying bladder Discussed importance of daily weight and advised patient to weigh and record daily Reviewed role of diuretics in prevention of fluid overload and management of heart failure; Discussed the importance of keeping all appointments with provider Screening for signs and symptoms of depression related to chronic disease state  Assessed social determinant of health barriers   COPD Interventions:  (Status:  New goal.) Short Term Goal Advised patient to track and manage COPD triggers Provided instruction about proper use of medications used for management of COPD including inhalers Discussed the importance of adequate rest and management of fatigue with COPD Screening for signs and symptoms of depression related to chronic disease state  Assessed social determinant of health barriers  Patient Goals/Self-Care Activities: Participate in Transition of Care Program/Attend TOC scheduled calls Notify  RN Care Manager of TOC  call rescheduling needs Take all medications as prescribed Attend all scheduled provider appointments Call pharmacy for medication refills 3-7 days in advance of running out of medications Call provider office for new concerns or questions   Follow Up Plan:  Telephone follow up appointment with care management team member scheduled for:  Tuesday January 7 at 12noon         Interventions Today    Flowsheet Row Most Recent Value  Chronic Disease   Chronic disease during today's visit Chronic Obstructive Pulmonary Disease (COPD)  General Interventions   General Interventions Discussed/Reviewed Referral to Nurse, Doctor Visits  Doctor Visits Discussed/Reviewed Doctor Visits Reviewed, PCP, Specialist  PCP/Specialist Visits Compliance with follow-up visit  Education Interventions   Education Provided Provided Education  Provided Verbal Education On Medication, When to see the doctor  Pharmacy Interventions   Pharmacy Dicussed/Reviewed Medications and their functions       Initial TOC Outreach to the patient. She was discharged from the hosptial 05/26/23 due to Pleural Effusion with Thoracentesis. She was found to have CHF and COPD exacerbation. New medications started for the patient. Reviewed current list. She continues with the wound vac from previous hospitalization from an infected groin wound. The [facility] nurse is scheduled to visit today. She complains of pain at the chest site where the Thoracentesis was performed. She states she is unable to move. She has follow up appointment with Pulmonology for 05/31/23. Encouraged the patient to call the providers office and describe current complaints for advice. Also encouraged the patient to call the [facility] Skilled Nurse as inquire if her visit could be a priority.  RNCM to call the patient on Tuesday January 7th for an update.  Patient is at high risk for readmission and/or has history of high utilization of ED.  Discussed [facility] TOC program and weekly calls  to patient to assess condition/status, medication management and provide support/education as indicated. Patient/ Caregiver voiced understanding and is agreeable to 30 day program    Patient educated on red flags signs and symptoms to watch for and was encouraged to report any of these identified, any new symptoms, changes in baseline or medication regimen, change in health status / well-being, or safety concerns to PCP and / or the [facility] Case Management team  Medford Balboa, RN RN Care Manager [facility]-Population [facility] [phone]

## 2023-05-29 ENCOUNTER — Ambulatory Visit (INDEPENDENT_AMBULATORY_CARE_PROVIDER_SITE_OTHER): Payer: Medicare Other | Admitting: Nurse Practitioner

## 2023-05-29 ENCOUNTER — Other Ambulatory Visit: Payer: Self-pay

## 2023-05-29 NOTE — Telephone Encounter (Signed)
 Copied from CRM [id number]. Topic: Quick Communication - Home Health Verbal Orders >> May 28, 2023  2:38 PM Everette C wrote: Caller/Agency: [facility] Number: [phone] Service Requested: Skilled Nursing Frequency: 2w7  Any new concerns about the patient? No

## 2023-05-29 NOTE — Patient Instructions (Signed)
 Visit Information  Thank you for taking time to visit with me today. Please don't hesitate to contact me if I can be of assistance to you before our next scheduled telephone appointment.  Following is a copy of your care plan:   Goals Addressed             This Visit's Progress    TOC Care Plan       Current Barriers:  Chronic Disease Management support and education needs related to CHF and COPD   RNCM Clinical Goal(s):  Patient will work with the Care Management team over the next 30 days to address Transition of Care Barriers: Medication access Medication Management Diet/Nutrition/Food Resources Support at home Provider appointments Home Health services Functional/Safety verbalize understanding of plan for management of CHF and COPD as evidenced by No re-admissions to the hospital in the next 30 days take all medications exactly as prescribed and will call provider for medication related questions as evidenced by verbal feedback each visit  through collaboration with RN Care manager, provider, and care team.   Interventions: Evaluation of current treatment plan related to  self management and patient's adherence to plan as established by provider   Heart Failure Interventions:  (Status:  New goal.) Short Term Goal Provided education on low sodium diet Assessed need for readable accurate scales in home Advised patient to weigh each morning after emptying bladder Discussed importance of daily weight and advised patient to weigh and record daily Reviewed role of diuretics in prevention of fluid overload and management of heart failure; Discussed the importance of keeping all appointments with provider Screening for signs and symptoms of depression related to chronic disease state  Assessed social determinant of health barriers   COPD Interventions:  (Status:  New goal.) Short Term Goal Advised patient to track and manage COPD triggers Provided instruction about proper use  of medications used for management of COPD including inhalers Discussed the importance of adequate rest and management of fatigue with COPD Screening for signs and symptoms of depression related to chronic disease state  Assessed social determinant of health barriers  Patient Goals/Self-Care Activities: Participate in Transition of Care Program/Attend [facility] scheduled calls Notify RN Care Manager of TOC call rescheduling needs Take all medications as prescribed Attend all scheduled provider appointments Call pharmacy for medication refills 3-7 days in advance of running out of medications Call provider office for new concerns or questions   Follow Up Plan:  Telephone follow up appointment with care management team member scheduled for:  Friday January 10th at 11AM          Patient verbalizes understanding of instructions and care plan provided today and agrees to view in MyChart. Active MyChart status and patient understanding of how to access instructions and care plan via MyChart confirmed with patient.     Please call the care guide team at [phone] if you need to cancel or reschedule your appointment.   Please call the Suicide and Crisis Lifeline: 988 call the [place]  National Suicide Prevention Lifeline: 330-753-4192 or TTY: 515-697-9739 TTY 4015798402) to talk to a trained counselor if you are experiencing a Mental Health or Behavioral Health Crisis or need someone to talk to.  Medford Balboa, RN Medical Illustrator [facility]-Population Health [phone]

## 2023-05-29 NOTE — Patient Outreach (Signed)
 Care Management  Transitions of Care Program Transitions of Care Post-discharge Visit #2   05/29/2023 Name: Dawn Bruce MRN: [id number] DOB: 06-09-1945  Subjective: Dawn Bruce is a 78 y.o. year old female who is a primary care patient of Bacigalupo, Jon HERO, MD. The Care Management team Engaged with patient Engaged with patient by telephone to assess and address transitions of care needs.   Consent to Services:  Patient was given information about care management services, agreed to services, and gave verbal consent to participate.   Assessment: TOC Outreach today. RNCM talked to the patient and family on 05/28/23. The patient states she is not that much better and still has significant pain in the chest area. The [facility] came yesterday and changed the wound vac and assessed patient. Vital Signs were normal. The patient states this is different pain from the chest pain when she had her NSTEMI. The nurse feels it is more muscular in presentation. The patient is going to see her Pulmonologist tomorrow 05/30/23 for exam. She has had 12 Thoracentesis performed over the last several years and will be evaluated for a Pleurx catheter if it is needed again. The family is requesting a lightweight wheelchair because the patient is having too much pain to walk or move. Provider notified. RNCM to follow up on Friday for an update to her pain and DME equipment.   SDOH Interventions    Flowsheet Row Telephone from 05/28/2023 in [place] POPULATION HEALTH DEPARTMENT Telephone from 05/14/2023 in [place] POPULATION HEALTH DEPARTMENT Care Coordination from 06/20/2022 in [facility] Cardiac Rehab from 04/24/2022 in [facility] Cardiac and Pulmonary Rehab Clinical Support from 04/20/2020 in [facility]  SDOH Interventions       Food Insecurity Interventions Intervention Not Indicated Intervention Not Indicated -- -- --  Housing Interventions Intervention Not Indicated Intervention Not  Indicated -- -- --  Transportation Interventions Intervention Not Indicated Intervention Not Indicated Intervention Not Indicated -- --  Utilities Interventions Intervention Not Indicated Intervention Not Indicated -- -- --  Depression Interventions/Treatment  -- -- -- PHQ2-9 Score <4 Follow-up Not Indicated --  Financial Strain Interventions -- -- Intervention Not Indicated -- --  Physical Activity Interventions -- -- -- -- --  FLORIAN.FRENCH minutes]        Goals Addressed             This Visit's Progress    TOC Care Plan       Current Barriers:  Chronic Disease Management support and education needs related to CHF and COPD   RNCM Clinical Goal(s):  Patient will work with the Care Management team over the next 30 days to address Transition of Care Barriers: Medication access Medication Management Diet/Nutrition/Food Resources Support at home Provider appointments Home Health services Functional/Safety verbalize understanding of plan for management of CHF and COPD as evidenced by No re-admissions to the hospital in the next 30 days take all medications exactly as prescribed and will call provider for medication related questions as evidenced by verbal feedback each visit  through collaboration with RN Care manager, provider, and care team.   Interventions: Evaluation of current treatment plan related to  self management and patient's adherence to plan as established by provider   Heart Failure Interventions:  (Status:  New goal.) Short Term Goal Provided education on low sodium diet Assessed need for readable accurate scales in home Advised patient to weigh each morning after emptying bladder Discussed importance of daily weight and advised patient to weigh  and record daily Reviewed role of diuretics in prevention of fluid overload and management of heart failure; Discussed the importance of keeping all appointments with provider Screening for signs and symptoms of depression  related to chronic disease state  Assessed social determinant of health barriers   COPD Interventions:  (Status:  New goal.) Short Term Goal Advised patient to track and manage COPD triggers Provided instruction about proper use of medications used for management of COPD including inhalers Discussed the importance of adequate rest and management of fatigue with COPD Screening for signs and symptoms of depression related to chronic disease state  Assessed social determinant of health barriers  Patient Goals/Self-Care Activities: Participate in Transition of Care Program/Attend [facility] scheduled calls Notify RN Care Manager of TOC call rescheduling needs Take all medications as prescribed Attend all scheduled provider appointments Call pharmacy for medication refills 3-7 days in advance of running out of medications Call provider office for new concerns or questions   Follow Up Plan:  Telephone follow up appointment with care management team member scheduled for:  Friday January 10th at 11AM        Please refer to Care Plan for goals and interventions.  Patient educated on red flags signs/symptoms to watch for and was encouraged to report any of these identified, any new symptoms, changes in baseline or medication regimen, change in health status / well-being, or safety concerns to PCP and / or the [facility] Case Management team.   Medford Balboa, RN RN Care Manager [facility]Population Health [phone]

## 2023-05-29 NOTE — Telephone Encounter (Signed)
 OK for verbals

## 2023-05-29 NOTE — Telephone Encounter (Signed)
 I called and gave verbal orders to [facility].

## 2023-05-30 DIAGNOSIS — R918 Other nonspecific abnormal finding of lung field: Secondary | ICD-10-CM | POA: Diagnosis not present

## 2023-05-30 DIAGNOSIS — Z9889 Other specified postprocedural states: Secondary | ICD-10-CM | POA: Diagnosis not present

## 2023-05-30 DIAGNOSIS — E440 Moderate protein-calorie malnutrition: Secondary | ICD-10-CM | POA: Diagnosis not present

## 2023-05-30 DIAGNOSIS — D631 Anemia in chronic kidney disease: Secondary | ICD-10-CM | POA: Diagnosis not present

## 2023-05-30 DIAGNOSIS — J918 Pleural effusion in other conditions classified elsewhere: Secondary | ICD-10-CM | POA: Diagnosis not present

## 2023-05-30 DIAGNOSIS — I252 Old myocardial infarction: Secondary | ICD-10-CM | POA: Diagnosis not present

## 2023-05-30 DIAGNOSIS — N1832 Chronic kidney disease, stage 3b: Secondary | ICD-10-CM | POA: Diagnosis not present

## 2023-05-30 DIAGNOSIS — N179 Acute kidney failure, unspecified: Secondary | ICD-10-CM | POA: Diagnosis not present

## 2023-05-30 DIAGNOSIS — I959 Hypotension, unspecified: Secondary | ICD-10-CM | POA: Diagnosis not present

## 2023-05-30 DIAGNOSIS — M549 Dorsalgia, unspecified: Secondary | ICD-10-CM | POA: Diagnosis not present

## 2023-05-30 DIAGNOSIS — R0782 Intercostal pain: Secondary | ICD-10-CM | POA: Diagnosis not present

## 2023-05-30 DIAGNOSIS — S31103A Unspecified open wound of abdominal wall, right lower quadrant without penetration into peritoneal cavity, initial encounter: Secondary | ICD-10-CM | POA: Diagnosis not present

## 2023-05-30 DIAGNOSIS — I440 Atrioventricular block, first degree: Secondary | ICD-10-CM | POA: Diagnosis not present

## 2023-05-30 DIAGNOSIS — E785 Hyperlipidemia, unspecified: Secondary | ICD-10-CM | POA: Diagnosis not present

## 2023-05-30 DIAGNOSIS — Z7901 Long term (current) use of anticoagulants: Secondary | ICD-10-CM | POA: Diagnosis not present

## 2023-05-30 DIAGNOSIS — I4891 Unspecified atrial fibrillation: Secondary | ICD-10-CM | POA: Diagnosis not present

## 2023-05-30 DIAGNOSIS — G894 Chronic pain syndrome: Secondary | ICD-10-CM | POA: Diagnosis not present

## 2023-05-30 DIAGNOSIS — I130 Hypertensive heart and chronic kidney disease with heart failure and stage 1 through stage 4 chronic kidney disease, or unspecified chronic kidney disease: Secondary | ICD-10-CM | POA: Diagnosis not present

## 2023-05-30 DIAGNOSIS — J984 Other disorders of lung: Secondary | ICD-10-CM | POA: Diagnosis not present

## 2023-05-30 DIAGNOSIS — E871 Hypo-osmolality and hyponatremia: Secondary | ICD-10-CM | POA: Diagnosis not present

## 2023-05-30 DIAGNOSIS — I5032 Chronic diastolic (congestive) heart failure: Secondary | ICD-10-CM | POA: Diagnosis not present

## 2023-05-30 DIAGNOSIS — I2510 Atherosclerotic heart disease of native coronary artery without angina pectoris: Secondary | ICD-10-CM | POA: Diagnosis not present

## 2023-05-30 DIAGNOSIS — J90 Pleural effusion, not elsewhere classified: Secondary | ICD-10-CM | POA: Diagnosis not present

## 2023-05-30 DIAGNOSIS — T8189XA Other complications of procedures, not elsewhere classified, initial encounter: Secondary | ICD-10-CM | POA: Diagnosis not present

## 2023-05-30 DIAGNOSIS — E876 Hypokalemia: Secondary | ICD-10-CM | POA: Diagnosis not present

## 2023-06-01 ENCOUNTER — Other Ambulatory Visit: Payer: Self-pay

## 2023-06-01 ENCOUNTER — Ambulatory Visit: Payer: Medicare Other | Admitting: Family Medicine

## 2023-06-04 ENCOUNTER — Telehealth: Payer: Self-pay

## 2023-06-04 NOTE — Patient Outreach (Signed)
 Care Management  Transitions of Care Program Transitions of Care Post-discharge Initial Outreach   06/04/2023 Name: Dawn Bruce MRN: [id number] DOB: 1945/07/17  Subjective: Dawn Bruce is a 78 y.o. year old female who is a primary care patient of Bacigalupo, Jon HERO, MD. The Care Management team Engaged with patient Engaged with patient by telephone to assess and address transitions of care needs.   Consent to Services:  Patient was given information about care management services, agreed to services, and gave verbal consent to participate.   Assessment:    Discharge Facility: Other [facility])      SDOH Interventions    Flowsheet Row Telephone from 06/04/2023 in [place] POPULATION HEALTH DEPARTMENT Telephone from 05/28/2023 in [place] POPULATION HEALTH DEPARTMENT Telephone from 05/14/2023 in [place] POPULATION HEALTH DEPARTMENT Care Coordination from 06/20/2022 in [facility] Cardiac Rehab from 04/24/2022 in [facility] Cardiac and Pulmonary Rehab Clinical Support from 04/20/2020 in [facility]  SDOH Interventions        Food Insecurity Interventions Intervention Not Indicated Intervention Not Indicated Intervention Not Indicated -- -- --  Housing Interventions Intervention Not Indicated Intervention Not Indicated Intervention Not Indicated -- -- --  Transportation Interventions Intervention Not Indicated Intervention Not Indicated Intervention Not Indicated Intervention Not Indicated -- --  Utilities Interventions Intervention Not Indicated Intervention Not Indicated Intervention Not Indicated -- -- --  Depression Interventions/Treatment  -- -- -- -- PHQ2-9 Score <4 Follow-up Not Indicated --  Financial Strain Interventions -- -- -- Intervention Not Indicated -- --  Physical Activity Interventions -- -- -- -- -- --  FLORIAN.FRENCH minutes]  Social Connections Interventions Intervention Not Indicated -- -- -- -- --        Goals Addressed              This Visit's Progress    TOC Care Plan       Current Barriers:  Chronic Disease Management support and education needs related to CHF and COPD   RNCM Clinical Goal(s):  Patient will work with the Care Management team over the next 30 days to address Transition of Care Barriers: Medication access Medication Management Diet/Nutrition/Food Resources Support at home Provider appointments Home Health services Functional/Safety verbalize understanding of plan for management of CHF and COPD as evidenced by No re-admissions to the hospital in the next 30 days take all medications exactly as prescribed and will call provider for medication related questions as evidenced by verbal feedback each visit  through collaboration with RN Care manager, provider, and care team.   Interventions: Evaluation of current treatment plan related to  self management and patient's adherence to plan as established by provider   Heart Failure Interventions:  (Status:  New goal.) Short Term Goal Provided education on low sodium diet Assessed need for readable accurate scales in home Advised patient to weigh each morning after emptying bladder Discussed importance of daily weight and advised patient to weigh and record daily Reviewed role of diuretics in prevention of fluid overload and management of heart failure; Discussed the importance of keeping all appointments with provider Screening for signs and symptoms of depression related to chronic disease state  Assessed social determinant of health barriers   COPD Interventions:  (Status:  New goal.) Short Term Goal Advised patient to track and manage COPD triggers Provided instruction about proper use of medications used for management of COPD including inhalers Discussed the importance of adequate rest and management of fatigue with COPD Screening for signs and  symptoms of depression related to chronic disease state  Assessed social determinant  of health barriers  Patient Goals/Self-Care Activities: Participate in Transition of Care Program/Attend [facility] scheduled calls Notify RN Care Manager of TOC call rescheduling needs Take all medications as prescribed Attend all scheduled provider appointments Call pharmacy for medication refills 3-7 days in advance of running out of medications Call provider office for new concerns or questions   Follow Up Plan:  Telephone follow up appointment with care management team member scheduled for:  Monday January 20th at 11AM         Interventions Today    Flowsheet Row Most Recent Value  Chronic Disease   Chronic disease during today's visit Other  [Chest Pain,]  General Interventions   General Interventions Discussed/Reviewed General Interventions Discussed, Doctor Visits  Doctor Visits Discussed/Reviewed Doctor Visits Discussed  Exercise Interventions   Exercise Discussed/Reviewed Exercise Reviewed  Education Interventions   Education Provided Provided Education  Provided Verbal Education On When to see the doctor, Exercise, Medication, Insurance Plans  Nutrition Interventions   Nutrition Discussed/Reviewed Nutrition Reviewed, Supplemental nutrition  Pharmacy Interventions   Pharmacy Dicussed/Reviewed Medications and their functions  Safety Interventions   Safety Discussed/Reviewed Fall Risk      TOC Outreach completed to the patient today. She was admitted back to the hospital due to left back pain. The provider discontinued her Lasix  and Midodrine  and her pain improved. She was discharged home. The wound vac was discontinued and she has a dressing changes that she is to do at home. The patient states she is having some pain today but not like before. She is not liking the nutritional supplements recommended and she is eating more protein instead. She is enrolled in the [facility] 30 day Outreach program.  Medford Balboa, RN RN Care Manager [facility]Population [facility] [phone]

## 2023-06-05 DIAGNOSIS — R0902 Hypoxemia: Secondary | ICD-10-CM | POA: Diagnosis not present

## 2023-06-06 ENCOUNTER — Telehealth: Payer: Self-pay | Admitting: Family Medicine

## 2023-06-06 ENCOUNTER — Encounter: Payer: Self-pay | Admitting: Family Medicine

## 2023-06-06 DIAGNOSIS — T8141XA Infection following a procedure, superficial incisional surgical site, initial encounter: Secondary | ICD-10-CM | POA: Diagnosis not present

## 2023-06-06 DIAGNOSIS — T8144XA Sepsis following a procedure, initial encounter: Secondary | ICD-10-CM | POA: Diagnosis not present

## 2023-06-06 DIAGNOSIS — L03314 Cellulitis of groin: Secondary | ICD-10-CM | POA: Diagnosis not present

## 2023-06-06 DIAGNOSIS — B964 Proteus (mirabilis) (morganii) as the cause of diseases classified elsewhere: Secondary | ICD-10-CM | POA: Diagnosis not present

## 2023-06-06 DIAGNOSIS — B952 Enterococcus as the cause of diseases classified elsewhere: Secondary | ICD-10-CM | POA: Diagnosis not present

## 2023-06-06 DIAGNOSIS — I5032 Chronic diastolic (congestive) heart failure: Secondary | ICD-10-CM | POA: Diagnosis not present

## 2023-06-06 DIAGNOSIS — I2510 Atherosclerotic heart disease of native coronary artery without angina pectoris: Secondary | ICD-10-CM | POA: Diagnosis not present

## 2023-06-06 DIAGNOSIS — R6520 Severe sepsis without septic shock: Secondary | ICD-10-CM | POA: Diagnosis not present

## 2023-06-06 DIAGNOSIS — A419 Sepsis, unspecified organism: Secondary | ICD-10-CM | POA: Diagnosis not present

## 2023-06-06 DIAGNOSIS — B9620 Unspecified Escherichia coli [E. coli] as the cause of diseases classified elsewhere: Secondary | ICD-10-CM | POA: Diagnosis not present

## 2023-06-06 DIAGNOSIS — G894 Chronic pain syndrome: Secondary | ICD-10-CM | POA: Diagnosis not present

## 2023-06-06 DIAGNOSIS — D631 Anemia in chronic kidney disease: Secondary | ICD-10-CM | POA: Diagnosis not present

## 2023-06-06 DIAGNOSIS — I130 Hypertensive heart and chronic kidney disease with heart failure and stage 1 through stage 4 chronic kidney disease, or unspecified chronic kidney disease: Secondary | ICD-10-CM | POA: Diagnosis not present

## 2023-06-06 DIAGNOSIS — I5023 Acute on chronic systolic (congestive) heart failure: Secondary | ICD-10-CM | POA: Diagnosis not present

## 2023-06-06 DIAGNOSIS — T8131XA Disruption of external operation (surgical) wound, not elsewhere classified, initial encounter: Secondary | ICD-10-CM | POA: Diagnosis not present

## 2023-06-06 DIAGNOSIS — N1831 Chronic kidney disease, stage 3a: Secondary | ICD-10-CM | POA: Diagnosis not present

## 2023-06-06 NOTE — Telephone Encounter (Signed)
 Home Health Verbal Orders - Caller/Agency:[facility] Callback Number: [phone]    LELE Service Requested: Skilled Nursing Frequency: 1 week x 5 weeks  Any new concerns about the patient? No

## 2023-06-07 DIAGNOSIS — J90 Pleural effusion, not elsewhere classified: Secondary | ICD-10-CM | POA: Diagnosis not present

## 2023-06-07 DIAGNOSIS — I2510 Atherosclerotic heart disease of native coronary artery without angina pectoris: Secondary | ICD-10-CM | POA: Diagnosis not present

## 2023-06-07 DIAGNOSIS — I5032 Chronic diastolic (congestive) heart failure: Secondary | ICD-10-CM | POA: Diagnosis not present

## 2023-06-07 DIAGNOSIS — I480 Paroxysmal atrial fibrillation: Secondary | ICD-10-CM | POA: Diagnosis not present

## 2023-06-07 NOTE — Telephone Encounter (Signed)
Verbals given  

## 2023-06-07 NOTE — Telephone Encounter (Signed)
OK for verbals

## 2023-06-08 ENCOUNTER — Encounter: Payer: Self-pay | Admitting: Family Medicine

## 2023-06-08 ENCOUNTER — Ambulatory Visit
Admission: RE | Admit: 2023-06-08 | Discharge: 2023-06-08 | Disposition: A | Discharge status: Home / Self Care | Payer: Medicare Other | Attending: Family Medicine | Admitting: Family Medicine

## 2023-06-08 ENCOUNTER — Ambulatory Visit: Payer: Medicare Other | Admitting: Family Medicine

## 2023-06-08 ENCOUNTER — Ambulatory Visit
Admission: RE | Admit: 2023-06-08 | Discharge: 2023-06-08 | Disposition: A | Discharge status: Home / Self Care | Payer: Medicare Other | Source: Ambulatory Visit | Attending: Family Medicine | Admitting: Family Medicine

## 2023-06-08 VITALS — BP 132/65 | HR 85

## 2023-06-08 DIAGNOSIS — I5020 Unspecified systolic (congestive) heart failure: Secondary | ICD-10-CM

## 2023-06-08 DIAGNOSIS — J90 Pleural effusion, not elsewhere classified: Secondary | ICD-10-CM | POA: Diagnosis not present

## 2023-06-08 DIAGNOSIS — I10 Essential (primary) hypertension: Secondary | ICD-10-CM | POA: Diagnosis not present

## 2023-06-08 DIAGNOSIS — Z9889 Other specified postprocedural states: Secondary | ICD-10-CM | POA: Diagnosis not present

## 2023-06-08 MED ORDER — FENTANYL 75 MCG/HR TD PT72: 1.0000 | MEDICATED_PATCH | TRANSDERMAL | 0 refills | Status: DC

## 2023-06-08 MED ORDER — ATORVASTATIN CALCIUM 40 MG PO TABS: 40.0000 mg | ORAL_TABLET | Freq: Every day | ORAL | 1 refills | Status: DC

## 2023-06-08 NOTE — Progress Notes (Signed)
Established patient visit   Patient: Dawn Bruce   DOB: 09/27/1945   78 y.o. Female  MRN: [id number] Visit Date: 06/08/2023  Today's healthcare provider: Shirlee Latch, MD   Chief Complaint  Patient presents with   Hospitalization Follow-up    Patient reports she feels fair. States things are up and down like a roller coaster. She reports some SOB. Patient husband reports that she has mention pain in her back on the left side predominately. Worried about possible fluid on lungs again    Subjective    HPI HPI     Hospitalization Follow-up    Additional comments: Patient reports she feels fair. States things are up and down like a roller coaster. She reports some SOB. Patient husband reports that she has mention pain in her back on the left side predominately. Worried about possible fluid on lungs again       Last edited by Acey Lav, CMA on 06/08/2023 11:20 AM.       Discussed the use of AI scribe software for clinical note transcription with the patient, who gave verbal consent to proceed.  History of Present Illness   Dawn Bruce, a patient with a complex medical history including a recent heart attack, laceration of the iliac artery, hematoma, and infection, presents for a follow-up visit after multiple hospital admissions. The patient reports experiencing back pain and shortness of breath, suspecting a recurrence of a previous pleural effusion. The back pain is described as severe and localized to the lower left side, a new symptom that has not been associated with previous pleural effusions. The patient also reports a history of a heart attack in November, which was treated with a stent placement. Following the procedure, the patient developed a laceration of the iliac artery, leading to a hematoma and subsequent infection. The patient reports that the infection and hematoma have improved, no longer requiring a wound vac, and the bandage is changed daily. The patient also  mentions a history of prediabetes, but no further details are provided.         Medications: Outpatient Medications Prior to Visit  Medication Sig   apixaban (ELIQUIS) 2.5 MG TABS tablet Take 2.5 mg by mouth 2 (two) times daily.   carvedilol (COREG) 12.5 MG tablet Take 12.5 mg by mouth 2 (two) times daily with a meal.   clopidogrel (PLAVIX) 75 MG tablet Take 1 tablet (75 mg total) by mouth daily.   empagliflozin (JARDIANCE) 10 MG TABS tablet Take 10 mg by mouth daily.   ezetimibe (ZETIA) 10 MG tablet TAKE 1 TABLET(10 MG) BY MOUTH DAILY   oxycodone (OXY-IR) 5 MG capsule Take 5 mg by mouth every 6 (six) hours as needed.   pregabalin (LYRICA) 150 MG capsule Take 1 capsule (150 mg total) by mouth 2 (two) times daily.   traZODone (DESYREL) 50 MG tablet Take 0.5-1 tablets (25-50 mg total) by mouth at bedtime as needed for sleep.   [DISCONTINUED] atorvastatin (LIPITOR) 40 MG tablet Take 1 tablet (40 mg total) by mouth daily.   [DISCONTINUED] fentaNYL (DURAGESIC) 75 MCG/HR Place 1 patch onto the skin every 3 (three) days.   [DISCONTINUED] fentaNYL (DURAGESIC) 25 MCG/HR Place 2 patches onto the skin every 3 (three) days. (Patient not taking: Reported on 05/28/2023)   [DISCONTINUED] furosemide (LASIX) 40 MG tablet Take 40 mg by mouth daily. (Patient not taking: Reported on 06/04/2023)   [DISCONTINUED] isosorbide mononitrate (IMDUR) 60 MG 24 hr tablet Take 1 tablet (60 mg total)  by mouth daily.   [DISCONTINUED] midodrine (PROAMATINE) 5 MG tablet Take 5 mg by mouth 3 (three) times daily with meals. (Patient not taking: Reported on 06/04/2023)   [DISCONTINUED] pantoprazole (PROTONIX) 40 MG tablet Take 1 tablet (40 mg total) by mouth daily.   [DISCONTINUED] shark liver oil-cocoa butter (PREPARATION H) 0.25-3-85.5 % suppository Place 1 suppository rectally as needed for hemorrhoids.   No facility-administered medications prior to visit.    Review of Systems     Objective    BP 132/65 (BP Location:  Right Arm, Patient Position: Sitting, Cuff Size: Normal)   Pulse 85    Physical Exam Vitals reviewed.  Constitutional:      General: She is not in acute distress.    Appearance: Normal appearance. She is well-developed. She is not diaphoretic.  HENT:     Head: Normocephalic and atraumatic.  Eyes:     General: No scleral icterus.    Conjunctiva/sclera: Conjunctivae normal.  Neck:     Thyroid: No thyromegaly.  Cardiovascular:     Rate and Rhythm: Normal rate and regular rhythm.     Heart sounds: Normal heart sounds. No murmur heard. Pulmonary:     Effort: Pulmonary effort is normal. No respiratory distress.     Breath sounds: Normal breath sounds. No wheezing, rhonchi or rales.  Musculoskeletal:     Cervical back: Neck supple.     Right lower leg: No edema.     Left lower leg: No edema.  Lymphadenopathy:     Cervical: No cervical adenopathy.  Skin:    General: Skin is warm and dry.     Findings: No rash.  Neurological:     Mental Status: She is alert and oriented to person, place, and time. Mental status is at baseline.  Psychiatric:        Mood and Affect: Mood normal.        Behavior: Behavior normal.      No results found for any visits on 06/08/23.  Assessment & Plan     Problem List Items Addressed This Visit       Cardiovascular and Mediastinum   Essential hypertension - Primary   Relevant Medications   atorvastatin (LIPITOR) 40 MG tablet   HFrEF (heart failure with reduced ejection fraction) (HCC)   Relevant Medications   atorvastatin (LIPITOR) 40 MG tablet     Respiratory   Pleural effusion   Relevant Orders   DG Chest 2 View     Other   S/P thoracentesis        Pleural Effusion Recurrent pleural effusion with increased shortness of breath and back pain, suggesting recurrence. Differential includes musculoskeletal pain due to tenderness on exam. Discussed potential interventions including pleurodesis and drain placement, with pleurodesis  considered aggressive. - Order chest x-ray stat to evaluate the size of the pleural effusion and check for rib fractures given tenderness on exam. - Continue Lyrica and fentanyl patches for pain management.  Post-Myocardial Infarction Status Status post-myocardial infarction with stent placement in November. On multiple medications for heart protection and blood thinning. Plans to switch to a closer cardiologist for convenience. - Continue atorvastatin and Zetia for cholesterol management. - Continue Plavix and Eliquis for blood thinning. - Continue Jardiance and carvedilol for heart protection.  Iliac Artery Laceration and Hematoma Status post-iliac artery laceration and hematoma with subsequent infection. Currently managing with daily bandage changes. Infection appears to be resolving with no signs of active infection. - Continue daily bandage changes.  Chronic Pain  Chronic pain managed with oxycodone as needed, fentanyl patches every three days, and Lyrica for an old elbow injury. - Refill fentanyl patches (75 mcg) and atorvastatin. - Provide extra strength Tylenol for additional pain relief today.          Return in about 3 months (around 09/06/2023) for chronic disease f/u.       Shirlee Latch, MD  [facility] [phone] (phone) [phone] (fax)  [facility]

## 2023-06-11 ENCOUNTER — Other Ambulatory Visit: Payer: Self-pay

## 2023-06-11 ENCOUNTER — Telehealth: Payer: Self-pay

## 2023-06-11 ENCOUNTER — Encounter: Payer: Self-pay | Admitting: Family Medicine

## 2023-06-11 NOTE — Patient Instructions (Signed)
Visit Information  Thank you for taking time to visit with me today. Please don't hesitate to contact me if I can be of assistance to you before our next scheduled telephone appointment.   Following is a copy of your care plan:   Goals Addressed             This Visit's Progress    TOC Care Plan   On track    Current Barriers:  Chronic Disease Management support and education needs related to CHF and COPD   RNCM Clinical Goal(s): (Reviewed 06/11/23) Patient will work with the Care Management team over the next 30 days to address Transition of Care Barriers: Medication access Medication Management Diet/Nutrition/Food Resources Support at home Provider appointments Home Health services Functional/Safety verbalize understanding of plan for management of CHF and COPD as evidenced by No re-admissions to the hospital in the next 30 days take all medications exactly as prescribed and will call provider for medication related questions as evidenced by verbal feedback each visit  through collaboration with RN Care manager, provider, and care team.   Interventions: (Reviewed 06/11/23) Evaluation of current treatment plan related to  self management and patient's adherence to plan as established by provider   Heart Failure Interventions:  (Status:  New goal.) Short Term Goal (Reviewed 06/11/23) Provided education on low sodium diet Assessed need for readable accurate scales in home Advised patient to weigh each morning after emptying bladder Discussed importance of daily weight and advised patient to weigh and record daily Reviewed role of diuretics in prevention of fluid overload and management of heart failure; Discussed the importance of keeping all appointments with provider Screening for signs and symptoms of depression related to chronic disease state  Assessed social determinant of health barriers   COPD Interventions:  (Status:  New goal.) Short Term Goal (Reviewed  06/11/23) Advised patient to track and manage COPD triggers Provided instruction about proper use of medications used for management of COPD including inhalers Discussed the importance of adequate rest and management of fatigue with COPD Screening for signs and symptoms of depression related to chronic disease state  Assessed social determinant of health barriers  Patient Goals/Self-Care Activities:  (Reviewed 06/11/23) Participate in Transition of Care Program/Attend [facility] scheduled calls Notify RN Care Manager of [facility] call rescheduling needs Take all medications as prescribed Attend all scheduled provider appointments Call pharmacy for medication refills 3-7 days in advance of running out of medications Call provider office for new concerns or questions   Follow Up Plan:  Telephone follow up appointment with care management team member scheduled for:  Monday January 27th at 11AM          Patient verbalizes understanding of instructions and care plan provided today and agrees to view in MyChart. Active MyChart status and patient understanding of how to access instructions and care plan via MyChart confirmed with patient.     The patient has been provided with contact information for the care management team and has been advised to call with any health related questions or concerns.   Please call the care guide team at [phone] if you need to cancel or reschedule your appointment.   Please call the Suicide and Crisis Lifeline: 988 call the [place] National Suicide Prevention Lifeline: 867-823-9105 or TTY: 9140103888 TTY (734)877-6596) to talk to a trained counselor if you are experiencing a Mental Health or Behavioral Health Crisis or need someone to talk to.  Deidre Ala, RN Medical illustrator [facility]Population [facility] [phone]

## 2023-06-11 NOTE — Patient Outreach (Signed)
Care Management  Transitions of Care Program Transitions of Care Post-discharge week 2   06/11/2023 Name: Dawn Bruce MRN: [id number] DOB: 12-24-1945  Subjective: Dawn Bruce is a 78 y.o. year old female who is a primary care patient of Bacigalupo, Marzella Schlein, MD. The Care Management team Engaged with patient Engaged with patient by telephone to assess and address transitions of care needs.   Consent to Services:  Patient was given information about care management services, agreed to services, and gave verbal consent to participate.   Assessment: TOC Outreach today. The patient states she is feeling about the same but is not in as much pain as she was in the past. She has her Duragesic patch but states that is use for nerve pain. She takes Oxycodone if she can't stand the pain. She had an Chest X-ray and there is still a moderated pleural effusion on the left and a right mild pleural effusion. She is trying to increase her weight. Her wound to her right groin is healing with a dressing and HHRN is coming by weekly as well as physical therapy.   SDOH Interventions    Flowsheet Row Patient Outreach from 06/11/2023 in [place] [facility] DEPARTMENT Telephone from 06/04/2023 in [place] POP[facility]ATION HEALTH DEPARTMENT Telephone from 05/28/2023 in [place] POPULATION HEALTH DEPARTMENT Telephone from 05/14/2023 in [place] POPULATION HEALTH DEPARTMENT Care Coordination from 06/20/2022 in [facility] Re[facility] from 04/24/2022 in [facility]  SDOH Interventions        Food Insecurity Interventions Intervention Not Indicated Intervention Not Indicated Intervention Not Indicated Intervention Not Indicated -- --  Housing Interventions Intervention Not Indicated Intervention Not Indicated Intervention Not Indicated Intervention Not Indicated -- --  Transportation Interventions Intervention Not Indicated Intervention Not Indicated Intervention Not Indicated  Intervention Not Indicated Intervention Not Indicated --  Utilities Interventions Intervention Not Indicated Intervention Not Indicated Intervention Not Indicated Intervention Not Indicated -- --  Alcohol Usage Interventions Intervention Not Indicated (Score <7) -- -- -- -- --  Depression Interventions/Treatment  -- -- -- -- -- PHQ2-9 Score <4 Follow-up Not Indicated  Financial Strain Interventions Intervention Not Indicated -- -- -- Intervention Not Indicated --  Physical Activity Interventions Intervention Not Indicated -- -- -- -- --  Stress Interventions Intervention Not Indicated -- -- -- -- --  Social Connections Interventions Intervention Not Indicated Intervention Not Indicated -- -- -- --  Health Literacy Interventions Intervention Not Indicated -- -- -- -- --        Goals Addressed             This Visit's Progress    TOC Care Plan   On track    Current Barriers:  Chronic Disease Management support and education needs related to CHF and COPD   RNCM Clinical Goal(s): (Reviewed 06/11/23) Patient will work with the Care Management team over the next 30 days to address Transition of Care Barriers: Medication access Medication Management Diet/Nutrition/Food Resources Support at home Provider appointments Home Health services Functional/Safety verbalize understanding of plan for management of CHF and COPD as evidenced by No re-admissions to the hospital in the next 30 days take all medications exactly as prescribed and will call provider for medication related questions as evidenced by verbal feedback each visit  through collaboration with RN Care manager, provider, and care team.   Interventions: (Reviewed 06/11/23) Evaluation of current treatment plan related to  self management and patient's adherence to plan as established by provider  Heart Failure Interventions:  (Status:  New goal.) Short Term Goal (Reviewed 06/11/23) Provided education on low sodium diet Assessed  need for readable accurate scales in home Advised patient to weigh each morning after emptying bladder Discussed importance of daily weight and advised patient to weigh and record daily Reviewed role of diuretics in prevention of fluid overload and management of heart failure; Discussed the importance of keeping all appointments with provider Screening for signs and symptoms of depression related to chronic disease state  Assessed social determinant of health barriers   COPD Interventions:  (Status:  New goal.) Short Term Goal (Reviewed 06/11/23) Advised patient to track and manage COPD triggers Provided instruction about proper use of medications used for management of COPD including inhalers Discussed the importance of adequate rest and management of fatigue with COPD Screening for signs and symptoms of depression related to chronic disease state  Assessed social determinant of health barriers  Patient Goals/Self-Care Activities:  (Reviewed 06/11/23) Participate in Transition of Care Program/Attend [facility] scheduled calls Notify RN Care Manager of [facility] call rescheduling needs Take all medications as prescribed Attend all scheduled provider appointments Call pharmacy for medication refills 3-7 days in advance of running out of medications Call provider office for new concerns or questions   Follow Up Plan:  Telephone follow up appointment with care management team member scheduled for:  Monday January 27th at 11AM          Routine follow-up and on-going assessment evaluation and education of disease processes, and recommended interventions for both chronic and acute medical conditions, will occur during each weekly visit during [facility] 30-day Program Outreach calls along with ongoing review of symptoms, medication reviews and reconciliation. Any updates, inconsistencies, discrepancies or acute care concerns will be addressed on the Care Plan and routed to the correct Practitioner if indicated.     The patient has been provided with contact information for the care management team and has been advised to call with any health-related questions or concerns. The patient verbalized understanding with current POC. The patient is directed to their insurance card regarding availability of benefits coverage.  Deidre Ala, RN Medical illustrator [facility]Population Health [phone]

## 2023-06-13 DIAGNOSIS — E440 Moderate protein-calorie malnutrition: Secondary | ICD-10-CM | POA: Diagnosis not present

## 2023-06-13 DIAGNOSIS — I5023 Acute on chronic systolic (congestive) heart failure: Secondary | ICD-10-CM | POA: Diagnosis not present

## 2023-06-13 DIAGNOSIS — J918 Pleural effusion in other conditions classified elsewhere: Secondary | ICD-10-CM | POA: Diagnosis not present

## 2023-06-13 DIAGNOSIS — E785 Hyperlipidemia, unspecified: Secondary | ICD-10-CM | POA: Diagnosis not present

## 2023-06-13 DIAGNOSIS — I4819 Other persistent atrial fibrillation: Secondary | ICD-10-CM | POA: Diagnosis not present

## 2023-06-13 DIAGNOSIS — G894 Chronic pain syndrome: Secondary | ICD-10-CM | POA: Diagnosis not present

## 2023-06-13 DIAGNOSIS — T8141XD Infection following a procedure, superficial incisional surgical site, subsequent encounter: Secondary | ICD-10-CM | POA: Diagnosis not present

## 2023-06-13 DIAGNOSIS — I252 Old myocardial infarction: Secondary | ICD-10-CM | POA: Diagnosis not present

## 2023-06-13 DIAGNOSIS — N179 Acute kidney failure, unspecified: Secondary | ICD-10-CM | POA: Diagnosis not present

## 2023-06-13 DIAGNOSIS — K219 Gastro-esophageal reflux disease without esophagitis: Secondary | ICD-10-CM | POA: Diagnosis not present

## 2023-06-13 DIAGNOSIS — I130 Hypertensive heart and chronic kidney disease with heart failure and stage 1 through stage 4 chronic kidney disease, or unspecified chronic kidney disease: Secondary | ICD-10-CM | POA: Diagnosis not present

## 2023-06-13 DIAGNOSIS — F411 Generalized anxiety disorder: Secondary | ICD-10-CM | POA: Diagnosis not present

## 2023-06-13 DIAGNOSIS — D696 Thrombocytopenia, unspecified: Secondary | ICD-10-CM | POA: Diagnosis not present

## 2023-06-13 DIAGNOSIS — I2510 Atherosclerotic heart disease of native coronary artery without angina pectoris: Secondary | ICD-10-CM | POA: Diagnosis not present

## 2023-06-13 DIAGNOSIS — T8131XD Disruption of external operation (surgical) wound, not elsewhere classified, subsequent encounter: Secondary | ICD-10-CM | POA: Diagnosis not present

## 2023-06-13 DIAGNOSIS — N1832 Chronic kidney disease, stage 3b: Secondary | ICD-10-CM | POA: Diagnosis not present

## 2023-06-13 DIAGNOSIS — I5032 Chronic diastolic (congestive) heart failure: Secondary | ICD-10-CM | POA: Diagnosis not present

## 2023-06-13 DIAGNOSIS — J9811 Atelectasis: Secondary | ICD-10-CM | POA: Diagnosis not present

## 2023-06-13 DIAGNOSIS — G4700 Insomnia, unspecified: Secondary | ICD-10-CM | POA: Diagnosis not present

## 2023-06-13 DIAGNOSIS — D631 Anemia in chronic kidney disease: Secondary | ICD-10-CM | POA: Diagnosis not present

## 2023-06-13 DIAGNOSIS — M4800 Spinal stenosis, site unspecified: Secondary | ICD-10-CM | POA: Diagnosis not present

## 2023-06-13 DIAGNOSIS — E559 Vitamin D deficiency, unspecified: Secondary | ICD-10-CM | POA: Diagnosis not present

## 2023-06-13 DIAGNOSIS — I6523 Occlusion and stenosis of bilateral carotid arteries: Secondary | ICD-10-CM | POA: Diagnosis not present

## 2023-06-13 DIAGNOSIS — G2581 Restless legs syndrome: Secondary | ICD-10-CM | POA: Diagnosis not present

## 2023-06-13 DIAGNOSIS — E663 Overweight: Secondary | ICD-10-CM | POA: Diagnosis not present

## 2023-06-14 DIAGNOSIS — J90 Pleural effusion, not elsewhere classified: Secondary | ICD-10-CM | POA: Diagnosis not present

## 2023-06-18 ENCOUNTER — Other Ambulatory Visit: Payer: Self-pay

## 2023-06-18 NOTE — Patient Instructions (Signed)
Visit Information  Thank you for taking time to visit with me today. Please don't hesitate to contact me if I can be of assistance to you before our next scheduled telephone appointment.  Following is a copy of your care plan:   Goals Addressed             This Visit's Progress    TOC Care Plan       Current Barriers:  Chronic Disease Management support and education needs related to CHF and COPD   RNCM Clinical Goal(s): (Reviewed 06/18/23) Patient will work with the Care Management team over the next 30 days to address Transition of Care Barriers: Medication access Medication Management Diet/Nutrition/Food Resources Support at home Provider appointments Home Health services Functional/Safety verbalize understanding of plan for management of CHF and COPD as evidenced by No re-admissions to the hospital in the next 30 days take all medications exactly as prescribed and will call provider for medication related questions as evidenced by verbal feedback each visit  through collaboration with RN Care manager, provider, and care team.   Interventions: (Reviewed 06/18/23) Evaluation of current treatment plan related to  self management and patient's adherence to plan as established by provider   Heart Failure Interventions:  (Status:  New goal.) Short Term Goal (Reviewed 06/18/23) Provided education on low sodium diet Assessed need for readable accurate scales in home Advised patient to weigh each morning after emptying bladder Discussed importance of daily weight and advised patient to weigh and record daily Reviewed role of diuretics in prevention of fluid overload and management of heart failure; Discussed the importance of keeping all appointments with provider Screening for signs and symptoms of depression related to chronic disease state  Assessed social determinant of health barriers   COPD Interventions:  (Status:  New goal.) Short Term Goal (Reviewed 06/18/23) Advised patient  to track and manage COPD triggers Provided instruction about proper use of medications used for management of COPD including inhalers Discussed the importance of adequate rest and management of fatigue with COPD Screening for signs and symptoms of depression related to chronic disease state  Assessed social determinant of health barriers  Patient Goals/Self-Care Activities:  (Reviewed 06/18/23) Participate in Transition of Care Program/Attend [facility] scheduled calls Notify RN Care Manager of [facility] call rescheduling needs Take all medications as prescribed Attend all scheduled provider appointments Call pharmacy for medication refills 3-7 days in advance of running out of medications Call provider office for new concerns or questions   Follow Up Plan:  Telephone follow up appointment with care management team member scheduled for:  Monday February 3 at 11AM          Patient verbalizes understanding of instructions and care plan provided today and agrees to view in MyChart. Active MyChart status and patient understanding of how to access instructions and care plan via MyChart confirmed with patient.     The patient has been provided with contact information for the care management team and has been advised to call with any health related questions or concerns.   Please call the care guide team at [phone] if you need to cancel or reschedule your appointment.   Please call the Suicide and Crisis Lifeline: 988 call the [place] National Suicide Prevention Lifeline: 303-767-1085 or TTY: 234-395-6222 TTY 661 509 3343) to talk to a trained counselor if you are experiencing a Mental Health or Behavioral Health Crisis or need someone to talk to.  Deidre Ala, BSN, Dance movement psychotherapist Health  VBCI - [employer]  RN Care Manager [phone]

## 2023-06-18 NOTE — Patient Outreach (Signed)
Care Management  Transitions of Care Program Transitions of Care Post-discharge week 3   06/18/2023 Name: Dawn Bruce MRN: [id number] DOB: 1945-11-18  Subjective: Dawn Bruce is a 78 y.o. year old female who is a primary care patient of Bacigalupo, Marzella Schlein, MD. The Care Management team Engaged with patient Engaged with patient by telephone to assess and address transitions of care needs.   Consent to Services:  Patient was given information about care management services, agreed to services, and gave verbal consent to participate.   Assessment: TOC Outreach completed today. The patient follows up with [facility] regarding her chronic Pleural Effusion. She states this week is the best she has felt in awhile. She hasn't needed to take her breakthrough pain medications. She is weak but doesn't feel that physical therapy would benefit her in anyway. Encouraged the patient to increase her activity as it would help her effusions. She is trying to put on some weight. Feels stable at this time. Outreach next week and then transfer to [facility] if the patient is in agreement.   SDOH Interventions    Flowsheet Row Patient Outreach from 06/18/2023 in [place] POPULATION HEALTH DEPARTMENT Patient Outreach from 06/11/2023 in [place] POPULATION HEALTH DEPARTMENT Telephone from 06/04/2023 in [place] POPULATION HEALTH DEPARTMENT Telephone from 05/28/2023 in [place] POPULATION HEALTH DEPARTMENT Telephone from 05/14/2023 in  POPULATION HEALTH DEPARTMENT Care Coordination from 06/20/2022 in [facility]  SDOH Interventions        Food Insecurity Interventions Intervention Not Indicated Intervention Not Indicated Intervention Not Indicated Intervention Not Indicated Intervention Not Indicated --  Housing Interventions Intervention Not Indicated Intervention Not Indicated Intervention Not Indicated Intervention Not Indicated Intervention Not Indicated --  Transportation  Interventions Intervention Not Indicated Intervention Not Indicated Intervention Not Indicated Intervention Not Indicated Intervention Not Indicated Intervention Not Indicated  Utilities Interventions Intervention Not Indicated Intervention Not Indicated Intervention Not Indicated Intervention Not Indicated Intervention Not Indicated --  Alcohol Usage Interventions Intervention Not Indicated (Score <7) Intervention Not Indicated (Score <7) -- -- -- --  Financial Strain Interventions -- Intervention Not Indicated -- -- -- Intervention Not Indicated  Physical Activity Interventions -- Intervention Not Indicated -- -- -- --  Stress Interventions -- Intervention Not Indicated -- -- -- --  Social Connections Interventions Intervention Not Indicated Intervention Not Indicated Intervention Not Indicated -- -- --  Health Literacy Interventions -- Intervention Not Indicated -- -- -- --        Goals Addressed             This Visit's Progress    TOC Care Plan       Current Barriers:  Chronic Disease Management support and education needs related to CHF and COPD   RNCM Clinical Goal(s): (Reviewed 06/18/23) Patient will work with the Care Management team over the next 30 days to address Transition of Care Barriers: Medication access Medication Management Diet/Nutrition/Food Resources Support at home Provider appointments Home Health services Functional/Safety verbalize understanding of plan for management of CHF and COPD as evidenced by No re-admissions to the hospital in the next 30 days take all medications exactly as prescribed and will call provider for medication related questions as evidenced by verbal feedback each visit  through collaboration with RN Care manager, provider, and care team.   Interventions: (Reviewed 06/18/23) Evaluation of current treatment plan related to  self management and patient's adherence to plan as established by provider   Heart Failure Interventions:   (Status:  New  goal.) Short Term Goal (Reviewed 06/18/23) Provided education on low sodium diet Assessed need for readable accurate scales in home Advised patient to weigh each morning after emptying bladder Discussed importance of daily weight and advised patient to weigh and record daily Reviewed role of diuretics in prevention of fluid overload and management of heart failure; Discussed the importance of keeping all appointments with provider Screening for signs and symptoms of depression related to chronic disease state  Assessed social determinant of health barriers   COPD Interventions:  (Status:  New goal.) Short Term Goal (Reviewed 06/18/23) Advised patient to track and manage COPD triggers Provided instruction about proper use of medications used for management of COPD including inhalers Discussed the importance of adequate rest and management of fatigue with COPD Screening for signs and symptoms of depression related to chronic disease state  Assessed social determinant of health barriers  Patient Goals/Self-Care Activities:  (Reviewed 06/18/23) Participate in Transition of Care Program/Attend [facility] scheduled calls Notify RN Care Manager of [facility] call rescheduling needs Take all medications as prescribed Attend all scheduled provider appointments Call pharmacy for medication refills 3-7 days in advance of running out of medications Call provider office for new concerns or questions   Follow Up Plan:  Telephone follow up appointment with care management team member scheduled for:  Monday February 3 at 11AM         Please refer to Care Plan for goals and interventions.  Patient educated on red flags signs/symptoms to watch for and was encouraged to report any of these identified, any new symptoms, changes in baseline or medication regimen, change in health status / well-being, or safety concerns to PCP and / or the [facility] Case Management team.   Deidre Ala, BSN, RN [place]   [facility] - [facility] RN Care Manager [phone]

## 2023-06-19 NOTE — Patient Outreach (Signed)
The patient returned call. Please review detailed January encounter note for [facility] call

## 2023-06-21 DIAGNOSIS — D631 Anemia in chronic kidney disease: Secondary | ICD-10-CM | POA: Diagnosis not present

## 2023-06-21 DIAGNOSIS — I130 Hypertensive heart and chronic kidney disease with heart failure and stage 1 through stage 4 chronic kidney disease, or unspecified chronic kidney disease: Secondary | ICD-10-CM | POA: Diagnosis not present

## 2023-06-21 DIAGNOSIS — N1832 Chronic kidney disease, stage 3b: Secondary | ICD-10-CM | POA: Diagnosis not present

## 2023-06-21 DIAGNOSIS — D696 Thrombocytopenia, unspecified: Secondary | ICD-10-CM | POA: Diagnosis not present

## 2023-06-21 DIAGNOSIS — T8131XD Disruption of external operation (surgical) wound, not elsewhere classified, subsequent encounter: Secondary | ICD-10-CM | POA: Diagnosis not present

## 2023-06-21 DIAGNOSIS — I5023 Acute on chronic systolic (congestive) heart failure: Secondary | ICD-10-CM | POA: Diagnosis not present

## 2023-06-21 DIAGNOSIS — I252 Old myocardial infarction: Secondary | ICD-10-CM | POA: Diagnosis not present

## 2023-06-21 DIAGNOSIS — G894 Chronic pain syndrome: Secondary | ICD-10-CM | POA: Diagnosis not present

## 2023-06-21 DIAGNOSIS — J918 Pleural effusion in other conditions classified elsewhere: Secondary | ICD-10-CM | POA: Diagnosis not present

## 2023-06-21 DIAGNOSIS — I2510 Atherosclerotic heart disease of native coronary artery without angina pectoris: Secondary | ICD-10-CM | POA: Diagnosis not present

## 2023-06-21 DIAGNOSIS — I4819 Other persistent atrial fibrillation: Secondary | ICD-10-CM | POA: Diagnosis not present

## 2023-06-21 DIAGNOSIS — N179 Acute kidney failure, unspecified: Secondary | ICD-10-CM | POA: Diagnosis not present

## 2023-06-21 DIAGNOSIS — E785 Hyperlipidemia, unspecified: Secondary | ICD-10-CM | POA: Diagnosis not present

## 2023-06-21 DIAGNOSIS — T8141XD Infection following a procedure, superficial incisional surgical site, subsequent encounter: Secondary | ICD-10-CM | POA: Diagnosis not present

## 2023-06-21 DIAGNOSIS — I5032 Chronic diastolic (congestive) heart failure: Secondary | ICD-10-CM | POA: Diagnosis not present

## 2023-06-21 DIAGNOSIS — M4800 Spinal stenosis, site unspecified: Secondary | ICD-10-CM | POA: Diagnosis not present

## 2023-06-22 ENCOUNTER — Telehealth: Payer: Self-pay | Admitting: Family Medicine

## 2023-06-22 NOTE — Telephone Encounter (Signed)
[facility] faxed refill request for the following medications:   carvedilol (COREG) 12.5 MG tablet     Please advise.

## 2023-06-25 ENCOUNTER — Other Ambulatory Visit: Payer: Self-pay

## 2023-06-25 MED ORDER — CARVEDILOL 12.5 MG PO TABS: 12.5000 mg | ORAL_TABLET | Freq: Two times a day (BID) | ORAL | 1 refills | Status: DC

## 2023-06-25 NOTE — Patient Instructions (Signed)
Visit Information  Thank you for taking time to visit with me today. Please don't hesitate to contact me if I can be of assistance to you before our next scheduled telephone appointment.  Our next appointment is by telephone on Monday February 10th at 11:00am  Following is a copy of your care plan:   Goals Addressed             This Visit's Progress    TOC Care Plan       Current Barriers:  Chronic Disease Management support and education needs related to CHF and COPD   RNCM Clinical Goal(s): (Reviewed 06/25/23) Patient will work with the Care Management team over the next 30 days to address Transition of Care Barriers: Medication access Medication Management Diet/Nutrition/Food Resources Support at home Provider appointments Home Health services Functional/Safety verbalize understanding of plan for management of CHF and COPD as evidenced by No re-admissions to the hospital in the next 30 days take all medications exactly as prescribed and will call provider for medication related questions as evidenced by verbal feedback each visit  through collaboration with RN Care manager, provider, and care team.   Interventions:  (Reviewed 06/25/23) Evaluation of current treatment plan related to  self management and patient's adherence to plan as established by provider   Heart Failure Interventions:  (Status:  New goal.) Short Term Goal (Reviewed 06/25/23) Provided education on low sodium diet Assessed need for readable accurate scales in home Advised patient to weigh each morning after emptying bladder Discussed importance of daily weight and advised patient to weigh and record daily Reviewed role of diuretics in prevention of fluid overload and management of heart failure; Discussed the importance of keeping all appointments with provider Screening for signs and symptoms of depression related to chronic disease state  Assessed social determinant of health barriers   COPD Interventions:   (Status:  New goal.) Short Term Goal (Reviewed 06/25/23) Advised patient to track and manage COPD triggers Provided instruction about proper use of medications used for management of COPD including inhalers Discussed the importance of adequate rest and management of fatigue with COPD Screening for signs and symptoms of depression related to chronic disease state  Assessed social determinant of health barriers  Patient Goals/Self-Care Activities:  (Reviewed 06/25/23) Participate in Transition of Care Program/Attend [facility] scheduled calls Notify RN Care Manager of [place] call rescheduling needs Take all medications as prescribed Attend all scheduled provider appointments Call pharmacy for medication refills 3-7 days in advance of running out of medications Call provider office for new concerns or questions   Follow Up Plan:  Telephone follow up appointment with care management team member scheduled for:  Monday February 10 at 11AM          Patient verbalizes understanding of instructions and care plan provided today and agrees to view in MyChart. Active MyChart status and patient understanding of how to access instructions and care plan via MyChart confirmed with patient.     The patient has been provided with contact information for the care management team and has been advised to call with any health related questions or concerns.   Please call the care guide team at [phone] if you need to cancel or reschedule your appointment.   Please call the Suicide and Crisis Lifeline: 988 call the [place] National Suicide Prevention Lifeline: 7375601735 or TTY: 443-049-5713 TTY [phone]) to talk to a trained counselor if you are experiencing a Mental Health or Behavioral Health Crisis or need someone to  talk to.  Deidre Ala, BSN, RN [place]  [facility] - [facility] RN Care Manager [phone]

## 2023-06-25 NOTE — Patient Outreach (Signed)
Care Management  Transitions of Care Program Transitions of Care Post-discharge week 4   06/25/2023 Name: Dawn Bruce MRN: [id number] DOB: 12/24/1945  Subjective: Dawn Bruce is a 78 y.o. year old female who is a primary care patient of Bacigalupo, Marzella Schlein, MD. The Care Management team Engaged with patient Engaged with patient by telephone to assess and address transitions of care needs.   Consent to Services:  Patient was given information about care management services, agreed to services, and gave verbal consent to participate.   Assessment: TOC Outreach completed to the patient. She states she is feeling good and stable but she is losing weight without meaning to. She does states that her appetite isn't good and she drinks one Boost drink a night. Discussed increasing calories with and extra boost and eating proteins to keep her strength up. Notified her provider. Will continue to follow. Stable in regards to Pleural effusions.    SDOH Interventions    Flowsheet Row Patient Outreach from 06/18/2023 in [place] POPULATION HEALTH DEPARTMENT Patient Outreach from 06/11/2023 in [place] POPULATION HEALTH DEPARTMENT Telephone from 06/04/2023 in [place] POPULATION HEALTH DEPARTMENT Telephone from 05/28/2023 in [place] POPULATION HEALTH DEPARTMENT Telephone from 05/14/2023 in  POPULATION HEALTH DEPARTMENT Care Coordination from 06/20/2022 in [facility]  SDOH Interventions        Food Insecurity Interventions Intervention Not Indicated Intervention Not Indicated Intervention Not Indicated Intervention Not Indicated Intervention Not Indicated --  Housing Interventions Intervention Not Indicated Intervention Not Indicated Intervention Not Indicated Intervention Not Indicated Intervention Not Indicated --  Transportation Interventions Intervention Not Indicated Intervention Not Indicated Intervention Not Indicated Intervention Not Indicated Intervention Not Indicated  Intervention Not Indicated  Utilities Interventions Intervention Not Indicated Intervention Not Indicated Intervention Not Indicated Intervention Not Indicated Intervention Not Indicated --  Alcohol Usage Interventions Intervention Not Indicated (Score <7) Intervention Not Indicated (Score <7) -- -- -- --  Financial Strain Interventions -- Intervention Not Indicated -- -- -- Intervention Not Indicated  Physical Activity Interventions -- Intervention Not Indicated -- -- -- --  Stress Interventions -- Intervention Not Indicated -- -- -- --  Social Connections Interventions Intervention Not Indicated Intervention Not Indicated Intervention Not Indicated -- -- --  Health Literacy Interventions -- Intervention Not Indicated -- -- -- --        Goals Addressed             This Visit's Progress    TOC Care Plan       Current Barriers:  Chronic Disease Management support and education needs related to CHF and COPD   RNCM Clinical Goal(s): (Reviewed 06/25/23) Patient will work with the Care Management team over the next 30 days to address Transition of Care Barriers: Medication access Medication Management Diet/Nutrition/Food Resources Support at home Provider appointments Home Health services Functional/Safety verbalize understanding of plan for management of CHF and COPD as evidenced by No re-admissions to the hospital in the next 30 days take all medications exactly as prescribed and will call provider for medication related questions as evidenced by verbal feedback each visit  through collaboration with RN Care manager, provider, and care team.   Interventions:  (Reviewed 06/25/23) Evaluation of current treatment plan related to  self management and patient's adherence to plan as established by provider   Heart Failure Interventions:  (Status:  New goal.) Short Term Goal (Reviewed 06/25/23) Provided education on low sodium diet Assessed need for readable accurate scales in home Advised  patient to weigh  each morning after emptying bladder Discussed importance of daily weight and advised patient to weigh and record daily Reviewed role of diuretics in prevention of fluid overload and management of heart failure; Discussed the importance of keeping all appointments with provider Screening for signs and symptoms of depression related to chronic disease state  Assessed social determinant of health barriers   COPD Interventions:  (Status:  New goal.) Short Term Goal (Reviewed 06/25/23) Advised patient to track and manage COPD triggers Provided instruction about proper use of medications used for management of COPD including inhalers Discussed the importance of adequate rest and management of fatigue with COPD Screening for signs and symptoms of depression related to chronic disease state  Assessed social determinant of health barriers  Patient Goals/Self-Care Activities:  (Reviewed 06/25/23) Participate in Transition of Care Program/Attend [facility] scheduled calls Notify RN Care Manager of [facility] call rescheduling needs Take all medications as prescribed Attend all scheduled provider appointments Call pharmacy for medication refills 3-7 days in advance of running out of medications Call provider office for new concerns or questions   Follow Up Plan:  Telephone follow up appointment with care management team member scheduled for:  Monday February 10 at 11AM         Patient educated on red flags signs/symptoms to watch for and was encouraged to report any of these identified, any new symptoms, changes in baseline or medication regimen, change in health status / well-being, or safety concerns to PCP and / or the [facility] Case Management team.   Deidre Ala, BSN, RN [place]  [facility] - [facility] RN Care Manager [phone]

## 2023-06-27 DIAGNOSIS — I2510 Atherosclerotic heart disease of native coronary artery without angina pectoris: Secondary | ICD-10-CM | POA: Diagnosis not present

## 2023-06-27 DIAGNOSIS — D631 Anemia in chronic kidney disease: Secondary | ICD-10-CM | POA: Diagnosis not present

## 2023-06-27 DIAGNOSIS — I4819 Other persistent atrial fibrillation: Secondary | ICD-10-CM | POA: Diagnosis not present

## 2023-06-27 DIAGNOSIS — T8141XD Infection following a procedure, superficial incisional surgical site, subsequent encounter: Secondary | ICD-10-CM | POA: Diagnosis not present

## 2023-06-27 DIAGNOSIS — I130 Hypertensive heart and chronic kidney disease with heart failure and stage 1 through stage 4 chronic kidney disease, or unspecified chronic kidney disease: Secondary | ICD-10-CM | POA: Diagnosis not present

## 2023-06-27 DIAGNOSIS — I5023 Acute on chronic systolic (congestive) heart failure: Secondary | ICD-10-CM | POA: Diagnosis not present

## 2023-06-27 DIAGNOSIS — D696 Thrombocytopenia, unspecified: Secondary | ICD-10-CM | POA: Diagnosis not present

## 2023-06-27 DIAGNOSIS — T8131XD Disruption of external operation (surgical) wound, not elsewhere classified, subsequent encounter: Secondary | ICD-10-CM | POA: Diagnosis not present

## 2023-06-27 DIAGNOSIS — G894 Chronic pain syndrome: Secondary | ICD-10-CM | POA: Diagnosis not present

## 2023-06-27 DIAGNOSIS — I5032 Chronic diastolic (congestive) heart failure: Secondary | ICD-10-CM | POA: Diagnosis not present

## 2023-06-27 DIAGNOSIS — N1832 Chronic kidney disease, stage 3b: Secondary | ICD-10-CM | POA: Diagnosis not present

## 2023-06-27 DIAGNOSIS — N179 Acute kidney failure, unspecified: Secondary | ICD-10-CM | POA: Diagnosis not present

## 2023-06-27 DIAGNOSIS — M4800 Spinal stenosis, site unspecified: Secondary | ICD-10-CM | POA: Diagnosis not present

## 2023-06-27 DIAGNOSIS — I252 Old myocardial infarction: Secondary | ICD-10-CM | POA: Diagnosis not present

## 2023-06-27 DIAGNOSIS — E785 Hyperlipidemia, unspecified: Secondary | ICD-10-CM | POA: Diagnosis not present

## 2023-06-27 DIAGNOSIS — J918 Pleural effusion in other conditions classified elsewhere: Secondary | ICD-10-CM | POA: Diagnosis not present

## 2023-07-02 ENCOUNTER — Other Ambulatory Visit: Payer: Self-pay

## 2023-07-02 ENCOUNTER — Encounter: Payer: Self-pay | Admitting: Family Medicine

## 2023-07-02 MED ORDER — TRAZODONE HCL 50 MG PO TABS
25.0000 mg | ORAL_TABLET | Freq: Every evening | ORAL | 3 refills | Status: DC | PRN
Start: 2023-07-02 — End: 2023-09-25

## 2023-07-02 NOTE — Telephone Encounter (Signed)
 [facility] faxed refill request for the following medications:   traZODone  (DESYREL ) 50 MG tablet    Please advise.

## 2023-07-02 NOTE — Patient Outreach (Signed)
 Care Management  Transitions of Care Program Transitions of Care Post-discharge week 5   07/02/2023 Name: Dawn Bruce MRN: [id number] DOB: 04-Sep-1945  Subjective: Dawn Bruce is a 78 y.o. year old female who is a primary care patient of Bacigalupo, Stan Eans, MD. The Care Management team Engaged with patient Engaged with patient by telephone to assess and address transitions of care needs.   Consent to Services:  Patient was given information about care management services, agreed to services, and gave verbal consent to participate.   Assessment: The patient's health has improved over the last two weeks. She states she was able to gain a pound this last week. She had been losing weight but she is now drinking Boost. Her wound to the groin is almost healed and her husband does her dressing changes. Her pleural effusions seem to be holding steady as well. The patient has progressed through the 30 Day Outreach program and does not wish to continue to [facility].    SDOH Interventions    Flowsheet Row Patient Outreach from 07/02/2023 in [place] POPULATION HEALTH DEPARTMENT Patient Outreach from 06/18/2023 in [place] POPULATION HEALTH DEPARTMENT Patient Outreach from 06/11/2023 in [place] POPULATION HEALTH DEPARTMENT Telephone from 06/04/2023 in [place] POPULATION HEALTH DEPARTMENT Telephone from 05/28/2023 in [place] POPULATION HEALTH DEPARTMENT Telephone from 05/14/2023 in [place] POPULATION HEALTH DEPARTMENT  SDOH Interventions        Food Insecurity Interventions Intervention Not Indicated Intervention Not Indicated Intervention Not Indicated Intervention Not Indicated Intervention Not Indicated Intervention Not Indicated  Housing Interventions Intervention Not Indicated Intervention Not Indicated Intervention Not Indicated Intervention Not Indicated Intervention Not Indicated Intervention Not Indicated  Transportation Interventions Intervention Not Indicated Intervention Not  Indicated Intervention Not Indicated Intervention Not Indicated Intervention Not Indicated Intervention Not Indicated  Utilities Interventions Intervention Not Indicated Intervention Not Indicated Intervention Not Indicated Intervention Not Indicated Intervention Not Indicated Intervention Not Indicated  Alcohol  Usage Interventions -- Intervention Not Indicated (Score <7) Intervention Not Indicated (Score <7) -- -- --  Financial Strain Interventions -- -- Intervention Not Indicated -- -- --  Physical Activity Interventions -- -- Intervention Not Indicated -- -- --  Stress Interventions -- -- Intervention Not Indicated -- -- --  Social Connections Interventions Intervention Not Indicated Intervention Not Indicated Intervention Not Indicated Intervention Not Indicated -- --  Health Literacy Interventions -- -- Intervention Not Indicated -- -- --        Goals Addressed             This Visit's Progress    COMPLETED: TOC Care Plan       Current Barriers:  Chronic Disease Management support and education needs related to CHF and COPD   RNCM Clinical Goal(s): (Reviewed 07/02/23) Patient will work with the Care Management team over the next 30 days to address Transition of Care Barriers: Medication access Medication Management Diet/Nutrition/Food Resources Support at home Provider appointments Home Health services Functional/Safety verbalize understanding of plan for management of CHF and COPD as evidenced by No re-admissions to the hospital in the next 30 days take all medications exactly as prescribed and will call provider for medication related questions as evidenced by verbal feedback each visit  through collaboration with RN Care manager, provider, and care team.   Interventions:  (Reviewed 07/02/23) Evaluation of current treatment plan related to  self management and patient's adherence to plan as established by provider   Heart Failure Interventions:  (Status:  New goal.) Short  Term Goal (Reviewed  07/02/23) Provided education on low sodium diet Assessed need for readable accurate scales in home Advised patient to weigh each morning after emptying bladder Discussed importance of daily weight and advised patient to weigh and record daily Reviewed role of diuretics in prevention of fluid overload and management of heart failure; Discussed the importance of keeping all appointments with provider Screening for signs and symptoms of depression related to chronic disease state  Assessed social determinant of health barriers   COPD Interventions:  (Status:  New goal.) Short Term Goal (Reviewed 07/02/23) Advised patient to track and manage COPD triggers Provided instruction about proper use of medications used for management of COPD including inhalers Discussed the importance of adequate rest and management of fatigue with COPD Screening for signs and symptoms of depression related to chronic disease state  Assessed social determinant of health barriers  Patient Goals/Self-Care Activities:  (Reviewed 07/02/23) Participate in Transition of Care Program/Attend [facility] scheduled calls Notify RN Care Manager of TOC call rescheduling needs Take all medications as prescribed Attend all scheduled provider appointments Call pharmacy for medication refills 3-7 days in advance of running out of medications Call provider office for new concerns or questions   Follow Up Plan:  The patient has completed the [facility] program and declines transfer to the [facility] department        Plan: The patient has been provided with contact information for the care management team and has been advised to call with any health related questions or concerns.   Gareld June, BSN, RN [place]  [facility] - [facility] RN Care Manager [phone]

## 2023-07-02 NOTE — Patient Instructions (Signed)
 Visit Information  Thank you for taking time to visit with me today. Please don't hesitate to contact me if I can be of assistance to you before our next scheduled telephone appointment.   Following is a copy of your care plan:   Goals Addressed             This Visit's Progress    COMPLETED: TOC Care Plan       Current Barriers:  Chronic Disease Management support and education needs related to CHF and COPD   RNCM Clinical Goal(s): (Reviewed 07/02/23) Patient will work with the Care Management team over the next 30 days to address Transition of Care Barriers: Medication access Medication Management Diet/Nutrition/Food Resources Support at home Provider appointments Home Health services Functional/Safety verbalize understanding of plan for management of CHF and COPD as evidenced by No re-admissions to the hospital in the next 30 days take all medications exactly as prescribed and will call provider for medication related questions as evidenced by verbal feedback each visit  through collaboration with RN Care manager, provider, and care team.   Interventions:  (Reviewed 07/02/23) Evaluation of current treatment plan related to  self management and patient's adherence to plan as established by provider   Heart Failure Interventions:  (Status:  New goal.) Short Term Goal (Reviewed 07/02/23) Provided education on low sodium diet Assessed need for readable accurate scales in home Advised patient to weigh each morning after emptying bladder Discussed importance of daily weight and advised patient to weigh and record daily Reviewed role of diuretics in prevention of fluid overload and management of heart failure; Discussed the importance of keeping all appointments with provider Screening for signs and symptoms of depression related to chronic disease state  Assessed social determinant of health barriers   COPD Interventions:  (Status:  New goal.) Short Term Goal (Reviewed  07/02/23) Advised patient to track and manage COPD triggers Provided instruction about proper use of medications used for management of COPD including inhalers Discussed the importance of adequate rest and management of fatigue with COPD Screening for signs and symptoms of depression related to chronic disease state  Assessed social determinant of health barriers  Patient Goals/Self-Care Activities:  (Reviewed 07/02/23) Participate in Transition of Care Program/Attend [facility] scheduled calls Notify RN Care Manager of TOC call rescheduling needs Take all medications as prescribed Attend all scheduled provider appointments Call pharmacy for medication refills 3-7 days in advance of running out of medications Call provider office for new concerns or questions   Follow Up Plan:  The patient has completed the [facility] program and declines transfer to the Longitudinal CCM department        Patient verbalizes understanding of instructions and care plan provided today and agrees to view in MyChart. Active MyChart status and patient understanding of how to access instructions and care plan via MyChart confirmed with patient.     The patient has been provided with contact information for the care management team and has been advised to call with any health related questions or concerns.   Please call the care guide team at [phone] if you need to cancel or reschedule your appointment.   Please call the Suicide and Crisis Lifeline: 988 call the [place]  National Suicide Prevention Lifeline: 217-058-1692 or TTY: (970)774-1225 TTY 249-720-3212) to talk to a trained counselor if you are experiencing a Mental Health or Behavioral Health Crisis or need someone to talk to.  Gareld June, BSN, Dance movement psychotherapist Health  VBCI - [employer] RN  Care Manager [phone]

## 2023-07-04 DIAGNOSIS — I4819 Other persistent atrial fibrillation: Secondary | ICD-10-CM | POA: Diagnosis not present

## 2023-07-04 DIAGNOSIS — I252 Old myocardial infarction: Secondary | ICD-10-CM | POA: Diagnosis not present

## 2023-07-04 DIAGNOSIS — I5023 Acute on chronic systolic (congestive) heart failure: Secondary | ICD-10-CM | POA: Diagnosis not present

## 2023-07-04 DIAGNOSIS — N1832 Chronic kidney disease, stage 3b: Secondary | ICD-10-CM | POA: Diagnosis not present

## 2023-07-04 DIAGNOSIS — E785 Hyperlipidemia, unspecified: Secondary | ICD-10-CM | POA: Diagnosis not present

## 2023-07-04 DIAGNOSIS — J918 Pleural effusion in other conditions classified elsewhere: Secondary | ICD-10-CM | POA: Diagnosis not present

## 2023-07-04 DIAGNOSIS — I130 Hypertensive heart and chronic kidney disease with heart failure and stage 1 through stage 4 chronic kidney disease, or unspecified chronic kidney disease: Secondary | ICD-10-CM | POA: Diagnosis not present

## 2023-07-04 DIAGNOSIS — N179 Acute kidney failure, unspecified: Secondary | ICD-10-CM | POA: Diagnosis not present

## 2023-07-04 DIAGNOSIS — D696 Thrombocytopenia, unspecified: Secondary | ICD-10-CM | POA: Diagnosis not present

## 2023-07-04 DIAGNOSIS — D631 Anemia in chronic kidney disease: Secondary | ICD-10-CM | POA: Diagnosis not present

## 2023-07-04 DIAGNOSIS — G894 Chronic pain syndrome: Secondary | ICD-10-CM | POA: Diagnosis not present

## 2023-07-04 DIAGNOSIS — T8131XD Disruption of external operation (surgical) wound, not elsewhere classified, subsequent encounter: Secondary | ICD-10-CM | POA: Diagnosis not present

## 2023-07-04 DIAGNOSIS — I2510 Atherosclerotic heart disease of native coronary artery without angina pectoris: Secondary | ICD-10-CM | POA: Diagnosis not present

## 2023-07-04 DIAGNOSIS — I5032 Chronic diastolic (congestive) heart failure: Secondary | ICD-10-CM | POA: Diagnosis not present

## 2023-07-04 DIAGNOSIS — M4800 Spinal stenosis, site unspecified: Secondary | ICD-10-CM | POA: Diagnosis not present

## 2023-07-04 DIAGNOSIS — T8141XD Infection following a procedure, superficial incisional surgical site, subsequent encounter: Secondary | ICD-10-CM | POA: Diagnosis not present

## 2023-07-06 DIAGNOSIS — R0902 Hypoxemia: Secondary | ICD-10-CM | POA: Diagnosis not present

## 2023-07-11 DIAGNOSIS — N1832 Chronic kidney disease, stage 3b: Secondary | ICD-10-CM | POA: Diagnosis not present

## 2023-07-11 DIAGNOSIS — I2510 Atherosclerotic heart disease of native coronary artery without angina pectoris: Secondary | ICD-10-CM | POA: Diagnosis not present

## 2023-07-11 DIAGNOSIS — D696 Thrombocytopenia, unspecified: Secondary | ICD-10-CM | POA: Diagnosis not present

## 2023-07-11 DIAGNOSIS — G894 Chronic pain syndrome: Secondary | ICD-10-CM | POA: Diagnosis not present

## 2023-07-11 DIAGNOSIS — I5032 Chronic diastolic (congestive) heart failure: Secondary | ICD-10-CM | POA: Diagnosis not present

## 2023-07-11 DIAGNOSIS — D631 Anemia in chronic kidney disease: Secondary | ICD-10-CM | POA: Diagnosis not present

## 2023-07-11 DIAGNOSIS — E785 Hyperlipidemia, unspecified: Secondary | ICD-10-CM | POA: Diagnosis not present

## 2023-07-11 DIAGNOSIS — I5023 Acute on chronic systolic (congestive) heart failure: Secondary | ICD-10-CM | POA: Diagnosis not present

## 2023-07-11 DIAGNOSIS — I4819 Other persistent atrial fibrillation: Secondary | ICD-10-CM | POA: Diagnosis not present

## 2023-07-11 DIAGNOSIS — I252 Old myocardial infarction: Secondary | ICD-10-CM | POA: Diagnosis not present

## 2023-07-11 DIAGNOSIS — I130 Hypertensive heart and chronic kidney disease with heart failure and stage 1 through stage 4 chronic kidney disease, or unspecified chronic kidney disease: Secondary | ICD-10-CM | POA: Diagnosis not present

## 2023-07-11 DIAGNOSIS — N179 Acute kidney failure, unspecified: Secondary | ICD-10-CM | POA: Diagnosis not present

## 2023-07-11 DIAGNOSIS — M4800 Spinal stenosis, site unspecified: Secondary | ICD-10-CM | POA: Diagnosis not present

## 2023-07-11 DIAGNOSIS — T8141XD Infection following a procedure, superficial incisional surgical site, subsequent encounter: Secondary | ICD-10-CM | POA: Diagnosis not present

## 2023-07-11 DIAGNOSIS — J918 Pleural effusion in other conditions classified elsewhere: Secondary | ICD-10-CM | POA: Diagnosis not present

## 2023-07-11 DIAGNOSIS — T8131XD Disruption of external operation (surgical) wound, not elsewhere classified, subsequent encounter: Secondary | ICD-10-CM | POA: Diagnosis not present

## 2023-07-24 ENCOUNTER — Encounter: Payer: Self-pay | Admitting: Family Medicine

## 2023-07-24 DIAGNOSIS — J90 Pleural effusion, not elsewhere classified: Secondary | ICD-10-CM | POA: Diagnosis not present

## 2023-07-25 ENCOUNTER — Ambulatory Visit: Payer: Medicare Other | Admitting: Dermatology

## 2023-07-31 DIAGNOSIS — R0609 Other forms of dyspnea: Secondary | ICD-10-CM | POA: Diagnosis not present

## 2023-07-31 DIAGNOSIS — R5381 Other malaise: Secondary | ICD-10-CM | POA: Diagnosis not present

## 2023-07-31 DIAGNOSIS — E440 Moderate protein-calorie malnutrition: Secondary | ICD-10-CM | POA: Diagnosis not present

## 2023-07-31 DIAGNOSIS — J90 Pleural effusion, not elsewhere classified: Secondary | ICD-10-CM | POA: Diagnosis not present

## 2023-08-03 DIAGNOSIS — R0902 Hypoxemia: Secondary | ICD-10-CM | POA: Diagnosis not present

## 2023-08-10 ENCOUNTER — Encounter: Payer: Self-pay | Admitting: Family Medicine

## 2023-08-14 MED ORDER — FENTANYL 75 MCG/HR TD PT72: 1.0000 | MEDICATED_PATCH | TRANSDERMAL | 0 refills | Status: DC

## 2023-08-20 ENCOUNTER — Other Ambulatory Visit: Payer: Self-pay | Admitting: Family Medicine

## 2023-08-20 MED ORDER — FENTANYL 75 MCG/HR TD PT72: 1.0000 | MEDICATED_PATCH | TRANSDERMAL | 0 refills | Status: DC

## 2023-08-20 NOTE — Telephone Encounter (Signed)
 Copied from CRM [id number]. Topic: Clinical - Prescription Issue >> Aug 20, 2023  9:37 AM Higinio Roger wrote: Reason for CRM: fentaNYL (DURAGESIC) 75 MCG/HR  was sent to wrong location [facility]) and needs to be refilled and sent to the [facility] location.  Callback #: [phone]  Preferred Pharmacy:  [id number] - [place], [place] - [address] [facility] [address] [place] [address] Phone: [phone] Fax: [phone] Hours: Not open 24 hours

## 2023-08-20 NOTE — Telephone Encounter (Signed)
 Requested medication (s) are due for refill today: Yes  Requested medication (s) are on the active medication list: Yes  Last refill:  08/14/23 to the wrong pharmacy  Future visit scheduled: Yes  Notes to clinic:  Unable to refill per protocol, cannot delegate. Resend to correct pharmacy     Requested Prescriptions  Pending Prescriptions Disp Refills   fentaNYL (DURAGESIC) 75 MCG/HR 10 patch 0    Sig: Place 1 patch onto the skin every 3 (three) days.     Not Delegated - Analgesics:  Opioid Agonists Failed - 08/20/2023  9:49 AM      Failed - This refill cannot be delegated      Failed - Urine Drug Screen completed in last 360 days      Failed - Valid encounter within last 3 months    Recent Outpatient Visits   None     Future Appointments             In 2 weeks Bacigalupo, Marzella Schlein, MD [facility]   In 1 month Deirdre Evener, MD [facility]

## 2023-08-22 DIAGNOSIS — G4730 Sleep apnea, unspecified: Secondary | ICD-10-CM | POA: Diagnosis not present

## 2023-08-22 DIAGNOSIS — R0902 Hypoxemia: Secondary | ICD-10-CM | POA: Diagnosis not present

## 2023-08-31 ENCOUNTER — Other Ambulatory Visit: Payer: Self-pay | Admitting: Family Medicine

## 2023-08-31 DIAGNOSIS — M792 Neuralgia and neuritis, unspecified: Secondary | ICD-10-CM

## 2023-09-03 ENCOUNTER — Encounter: Payer: Self-pay | Admitting: Family Medicine

## 2023-09-03 ENCOUNTER — Telehealth (INDEPENDENT_AMBULATORY_CARE_PROVIDER_SITE_OTHER): Admitting: Family Medicine

## 2023-09-03 DIAGNOSIS — M792 Neuralgia and neuritis, unspecified: Secondary | ICD-10-CM

## 2023-09-03 DIAGNOSIS — R0902 Hypoxemia: Secondary | ICD-10-CM | POA: Diagnosis not present

## 2023-09-03 MED ORDER — PREGABALIN 150 MG PO CAPS: 150.0000 mg | ORAL_CAPSULE | Freq: Three times a day (TID) | ORAL | 1 refills | Status: AC

## 2023-09-03 NOTE — Telephone Encounter (Signed)
 Requested medications are due for refill today.  yes  Requested medications are on the active medications list.  yes  Last refill. 01/19/2023 #180 1 rf  Future visit scheduled.   yes  Notes to clinic.  Refill not delegated.    Requested Prescriptions  Pending Prescriptions Disp Refills   pregabalin (LYRICA) 150 MG capsule [Pharmacy Med Name: PREGABALIN 150MG  CAPSULES] 180 capsule     Sig: TAKE 1 CAPSULE(150 MG) BY MOUTH TWICE DAILY     Not Delegated - Neurology:  Anticonvulsants - Controlled - pregabalin Failed - 09/03/2023  9:06 AM      Failed - This refill cannot be delegated      Failed - Cr in normal range and within 360 days    Creatinine, Ser  Date Value Ref Range Status  05/13/2023 1.65 (H) 0.44 - 1.00 mg/dL Final         Failed - Valid encounter within last 12 months    Recent Outpatient Visits   None     Future Appointments             Today Bacigalupo, Stan Eans, MD [facility], [facility]   In 4 weeks Elta Halter, MD [facility]            Passed - Completed PHQ-2 or PHQ-9 in the last 360 days

## 2023-09-03 NOTE — Progress Notes (Signed)
 MyChart Video Visit    Virtual Visit via Video Note   This format is felt to be most appropriate for this patient at this time. Physical exam was limited by quality of the video and audio technology used for the visit.    Patient location: home Provider location: [facility] Persons involved in the visit: patient, provider  I discussed the limitations of evaluation and management by telemedicine and the availability of in person appointments. The patient expressed understanding and agreed to proceed.  Patient: Dawn Bruce   DOB: 1945-06-07   78 y.o. Female  MRN: [id number] Visit Date: 09/03/2023  Today's healthcare provider: Shirlee Latch, MD   No chief complaint on file.  Subjective    HPI   Discussed the use of AI scribe software for clinical note transcription with the patient, who gave verbal consent to proceed.  History of Present Illness   The patient, with a history of nerve pain following an accident, presents with worsening left foot pain over the past couple of years. The pain originates from the big toe and extends across the top of the foot to the ankle, with the patient expressing concern that it may progress up to the knee. The pain is severe and random, with no identifiable triggers. Current pain management includes a fentanyl patch and oxycodone, but these provide minimal relief. The patient also takes Lyrica, which effectively manages nerve pain in the hand and arm but does not alleviate the foot pain. The patient denies any back pain.         Review of Systems      Objective    There were no vitals taken for this visit.      Physical Exam Constitutional:      General: She is not in acute distress.    Appearance: Normal appearance.  HENT:     Head: Normocephalic.  Pulmonary:     Effort: Pulmonary effort is normal. No respiratory distress.  Neurological:     Mental Status: She is alert and oriented to person, place, and  time. Mental status is at baseline.        Assessment & Plan     Problem List Items Addressed This Visit       Other   Neurogenic pain - Primary (Chronic)   Relevant Medications   pregabalin (LYRICA) 150 MG capsule   Other Relevant Orders   Ambulatory referral to Neurology   Other Visit Diagnoses       Intractable neuropathic pain of left foot       Relevant Orders   Ambulatory referral to Neurology       Assessment and Plan    Neuropathic pain in left foot Chronic neuropathic pain in the left foot, originating from the big toe and extending across the top of the foot to the ankle, with potential progression towards the knee. The pain is severe and has been worsening over the past couple of years. Current medications include fentanyl patches and oxycodone, which provide limited relief. Lyrica (pregabalin) is being used for nerve pain but is not effectively managing the foot pain. Differential diagnosis includes potential pinched nerve in the back, given the unilateral nature of the pain. Increasing Lyrica dosage to 150 mg three times a day is considered, although the risk of side effects like drowsiness increases significantly at higher doses. Referral to a neurologist for possible EMG and nerve conduction study is planned to determine the origin of the neuropathic pain. -  Increase Lyrica dosage to 150 mg three times a day. - Refer to neurologist at [facility] for possible EMG and nerve conduction study.  Follow-up Follow-up for scheduling of neurology appointment and annual physical exam. - Contact to schedule annual physical exam after January 04, 2024. - Ensure neurologist contacts to schedule consultation. If no contact within two weeks, inform the office.         Meds ordered this encounter  Medications   pregabalin (LYRICA) 150 MG capsule    Sig: Take 1 capsule (150 mg total) by mouth 3 (three) times daily.    Dispense:  270 capsule    Refill:  1      Return in about 3 months (around 12/03/2023) for CPE (after 7/19).     I discussed the assessment and treatment plan with the patient. The patient was provided an opportunity to ask questions and all were answered. The patient agreed with the plan and demonstrated an understanding of the instructions.   The patient was advised to call back or seek an in-person evaluation if the symptoms worsen or if the condition fails to improve as anticipated.   Aden Agreste, MD [facility] [phone] (phone) [phone] (fax)  [facility]

## 2023-09-10 DIAGNOSIS — K080 Exfoliation of teeth due to systemic causes: Secondary | ICD-10-CM | POA: Diagnosis not present

## 2023-09-24 DIAGNOSIS — J90 Pleural effusion, not elsewhere classified: Secondary | ICD-10-CM | POA: Diagnosis not present

## 2023-09-24 DIAGNOSIS — R0609 Other forms of dyspnea: Secondary | ICD-10-CM | POA: Diagnosis not present

## 2023-09-24 DIAGNOSIS — R5381 Other malaise: Secondary | ICD-10-CM | POA: Diagnosis not present

## 2023-09-24 DIAGNOSIS — E440 Moderate protein-calorie malnutrition: Secondary | ICD-10-CM | POA: Diagnosis not present

## 2023-09-25 ENCOUNTER — Encounter: Payer: Self-pay | Admitting: Surgery

## 2023-09-25 ENCOUNTER — Ambulatory Visit: Admitting: Surgery

## 2023-09-25 VITALS — BP 158/77 | HR 70 | Ht 63.0 in | Wt 118.2 lb

## 2023-09-25 DIAGNOSIS — K4090 Unilateral inguinal hernia, without obstruction or gangrene, not specified as recurrent: Secondary | ICD-10-CM | POA: Insufficient documentation

## 2023-09-25 NOTE — Progress Notes (Signed)
 Patient ID: Dawn Bruce, female   DOB: [birth year], 78 y.o.   MRN: [id number]  Chief Complaint: Right lower quadrant bulge  History of Present Illness Dawn Bruce is a 78 y.o. female with prior history of conservatively managed appendiceal abscess with percutaneous drainage and antibiotics.  She has done remarkably well from this, and any interval has gone through a significant amount of vascular intervention.  Even time in ICU and near loss of life perceived.  She now presents with a rather asymptomatic bulge in the right lower quadrant/high inguinal area.  It goes away when she lays down, presents when standing.  She denies pain or tenderness.  Had no bowel habit changes.  Was concerned about the appendix in that same area.  She also has a wound healing in that right groin from her vascular intervention.  She has also been on Eliquis  and Plavix  since November.  Past Medical History Past Medical History:  Diagnosis Date   Actinic keratosis    Chronic pain    GERD (gastroesophageal reflux disease) ?   Taking Pepcid    History of basal cell carcinoma (BCC) 05/02/2021   right upper back paraspinal   History of SCC (squamous cell carcinoma) of skin 09/10/2019   left dorsum wrist ED&C done 10/28/19   History of SCC (squamous cell carcinoma) of skin 03/04/2020   right dorsum hand   History of squamous cell carcinoma in situ (SCCIS) 03/04/2020   left dorsum wrist  ED&C   History of squamous cell carcinoma in situ (SCCIS) 03/04/2020   right medial infraorbital  ED&C 04/28/2020   History of squamous cell carcinoma in situ (SCCIS) 10/28/2019   right dorsum hand proximal lateral   History of squamous cell carcinoma in situ (SCCIS) 10/28/2019   right dorsum hand proximal medial   Hyperlipidemia    Hypertension    NSTEMI (non-ST elevated myocardial infarction) (HCC) 04/12/2023   Renal disorder    Squamous cell carcinoma in situ 10/31/2021   left distal  tricep, EDC   Squamous cell carcinoma of skin  08/28/2022   Right volar forearm - EDC      Past Surgical History:  Procedure Laterality Date   ABDOMINAL HYSTERECTOMY     APPENDECTOMY  April 2023   Burst appendix   APPLICATION OF WOUND VAC Right 04/25/2023   Procedure: APPLICATION OF WOUND VAC;  Surgeon: Jackquelyn Mass, MD;  Location: [facility];  Service: Vascular;  Laterality: Right;   APPLICATION OF WOUND VAC Right 04/27/2023   Procedure: WOUND VAC EXCHANGE;  Surgeon: Jackquelyn Mass, MD;  Location: [facility];  Service: Vascular;  Laterality: Right;   APPLICATION OF WOUND VAC Right 05/01/2023   Procedure: APPLICATION OF WOUND VAC;  Surgeon: Jackquelyn Mass, MD;  Location: [facility];  Service: Vascular;  Laterality: Right;   AUGMENTATION MAMMAPLASTY Bilateral    25-30 years ago   CARDIOVERSION N/A 12/01/2020   Procedure: CARDIOVERSION;  Surgeon: Constancia Delton, MD;  Location: [facility];  Service: Cardiovascular;  Laterality: N/A;   CAROTID ARTERY ANGIOPLASTY Right 1995(approximate)   CAROTID ENDARTERECTOMY  2001   CORONARY STENT INTERVENTION N/A 04/13/2023   Procedure: CORONARY STENT INTERVENTION;  Surgeon: Percival Brace, MD;  Location: [facility];  Service: Cardiovascular;  Laterality: N/A;   COSMETIC SURGERY     ELBOW SURGERY Left    ENDARTERECTOMY FEMORAL Right 04/13/2023   Procedure: Right groin exploration with repair of right external iliac artery and right circumflex artery;  Surgeon: Lesta Rater, MD;  Location: [facility];  Service: Vascular;  Laterality: Right;   FRACTURE SURGERY  2016   Arm   INCISION AND DRAINAGE OF WOUND Right 04/25/2023   Procedure: IRRIGATION AND DEBRIDEMENT WOUND;  Surgeon: Jackquelyn Mass, MD;  Location: [facility];  Service: Vascular;  Laterality: Right;   INCISION AND DRAINAGE OF WOUND Right 04/27/2023   Procedure: IRRIGATION AND DEBRIDEMENT WOUND;  Surgeon: Jackquelyn Mass, MD;  Location: [facility];  Service: Vascular;  Laterality: Right;   INCISION AND DRAINAGE OF  WOUND Right 05/01/2023   Procedure: IRRIGATION AND DEBRIDEMENT WOUND;  Surgeon: Jackquelyn Mass, MD;  Location: [facility];  Service: Vascular;  Laterality: Right;   LEFT HEART CATH AND CORONARY ANGIOGRAPHY N/A 04/13/2023   Procedure: LEFT HEART CATH AND CORONARY ANGIOGRAPHY;  Surgeon: Percival Brace, MD;  Location: [facility];  Service: Cardiovascular;  Laterality: N/A;   THORACENTESIS N/A 12/27/2020   Procedure: Antoine Kirsch;  Surgeon: Prudy Brownie, DO;  Location: [facility];  Service: Pulmonary;  Laterality: N/A;   TONSILLECTOMY     TUBAL LIGATION  ?    No Known Allergies  Current Outpatient Medications  Medication Sig Dispense Refill   apixaban  (ELIQUIS ) 2.5 MG TABS tablet Take 2.5 mg by mouth 2 (two) times daily.     carvedilol  (COREG ) 12.5 MG tablet Take 1 tablet (12.5 mg total) by mouth 2 (two) times daily with a meal. (Patient taking differently: Take 6.25 mg by mouth 2 (two) times daily with a meal.) 180 tablet 1   clopidogrel  (PLAVIX ) 75 MG tablet Take 1 tablet (75 mg total) by mouth daily. 90 tablet 1   empagliflozin (JARDIANCE) 10 MG TABS tablet Take 10 mg by mouth daily.     ezetimibe  (ZETIA ) 10 MG tablet TAKE 1 TABLET(10 MG) BY MOUTH DAILY 90 tablet 0   fentaNYL  (DURAGESIC ) 75 MCG/HR Place 1 patch onto the skin every 3 (three) days. 10 patch 0   pregabalin  (LYRICA ) 150 MG capsule Take 1 capsule (150 mg total) by mouth 3 (three) times daily. 270 capsule 1   atorvastatin  (LIPITOR ) 40 MG tablet Take 1 tablet (40 mg total) by mouth daily. 90 tablet 1   No current facility-administered medications for this visit.    Family History Family History  Problem Relation Age of Onset   Cancer Mother    Cancer Father    Cancer Sister    Breast cancer Neg Hx       Social History Social History   Tobacco Use   Smoking status: Former    Current packs/day: 0.00    Average packs/day: 0.3 packs/day for 15.0 years (3.8 ttl pk-yrs)    Types: Cigarettes     Start date: 57    Quit date: 14    Years since quitting: 35.3    Passive exposure: Past   Smokeless tobacco: Never   Tobacco comments:    quit around 1990-1995 (?)  Vaping Use   Vaping status: Never Used  Substance Use Topics   Alcohol  use: Yes    Alcohol /week: 14.0 standard drinks of alcohol     Types: 14 Standard drinks or equivalent per week    Comment: 2 scotch shots   Drug use: Never        Review of Systems  All other systems reviewed and are negative.    Physical Exam Blood pressure (!) 158/77, pulse 70, height 5\' 3"  (1.6 m), weight 118 lb 3.2 oz (53.6 kg), SpO2 98%. Last Weight  Most recent update:  09/25/2023 10:01 AM    Weight  53.6 kg (118 lb 3.2 oz)             CONSTITUTIONAL: Well developed, and nourished, appropriately responsive and aware without distress.   EYES: Sclera non-icteric.   EARS, NOSE, MOUTH AND THROAT:  The oropharynx is clear. Oral mucosa is pink and moist.     Hearing is intact to voice.  NECK: Trachea is midline, and there is no jugular venous distension.  LYMPH NODES:  Lymph nodes in the neck are not appreciated. RESPIRATORY:  Lungs are clear, and breath sounds are equal bilaterally.  Normal respiratory effort without pathologic use of accessory muscles. CARDIOVASCULAR:   Well perfused.  GI: The abdomen is  soft, nontender, and nondistended. There were no palpable masses.  I did not appreciate hepatosplenomegaly.  GU: Caryl Lyn present as chaperone, there is a rather widemouth bulge hide in the right groin, consistent with direct inguinal hernia.  Inferior to this in the right groin there is a dressing intact covering a progressively healing/superficial described groin wound. MUSCULOSKELETAL:  Symmetrical muscle tone appreciated in all four extremities.    SKIN: Skin turgor is normal. No pathologic skin lesions appreciated.  NEUROLOGIC:  Motor and sensation appear grossly normal.  Cranial nerves are grossly without defect. PSYCH:  Alert  and oriented to person, place and time. Affect is appropriate for situation.  Data Reviewed I have personally reviewed what is currently available of the patient's imaging, recent labs and medical records.   Labs:     Latest Ref Rng & Units 05/13/2023    4:17 AM 05/12/2023    6:18 AM 05/10/2023    1:47 PM  CBC  WBC 4.0 - 10.5 K/uL 6.0  5.0  6.8   Hemoglobin 12.0 - 15.0 g/dL 81.1  7.6  8.2   Hematocrit 36.0 - 46.0 % 30.6  23.7  25.9   Platelets 150 - 400 K/uL 214  175  135       Latest Ref Rng & Units 05/13/2023    4:17 AM 05/12/2023    6:18 AM 05/11/2023    3:50 AM  CMP  Glucose 70 - 99 mg/dL 95  96    BUN 8 - 23 mg/dL 40  48    Creatinine 9.14 - 1.00 mg/dL 7.82  9.56  2.13   Sodium 135 - 145 mmol/L 142  144    Potassium 3.5 - 5.1 mmol/L 3.9  4.1    Chloride 98 - 111 mmol/L 109  112    CO2 22 - 32 mmol/L 25  25    Calcium  8.9 - 10.3 mg/dL 8.3  8.3    Total Protein 6.5 - 8.1 g/dL  4.8    Total Bilirubin <1.2 mg/dL  0.6    Alkaline Phos 38 - 126 U/L  44    AST 15 - 41 U/L  17    ALT 0 - 44 U/L  9        Imaging: Radiological images reviewed:  CLINICAL DATA:  Right lower quadrant abdominal pain.   EXAM: CT ABDOMEN AND PELVIS WITH CONTRAST   TECHNIQUE: Multidetector CT imaging of the abdomen and pelvis was performed using the standard protocol following bolus administration of intravenous contrast.   RADIATION DOSE REDUCTION: This exam was performed according to the departmental dose-optimization program which includes automated exposure control, adjustment of the mA and/or kV according to patient size and/or use of iterative reconstruction technique.   CONTRAST:  75mL OMNIPAQUE   IOHEXOL  300 MG/ML  SOLN   COMPARISON:  April 13, 2023.   FINDINGS: Lower chest: Large left pleural effusion is noted with small right pleural effusion, with adjacent atelectasis of the lower lobes.   Hepatobiliary: No focal liver abnormality is seen. No gallstones, gallbladder  wall thickening, or biliary dilatation.   Pancreas: Unremarkable. No pancreatic ductal dilatation or surrounding inflammatory changes.   Spleen: Normal in size without focal abnormality.   Adrenals/Urinary Tract: Adrenal glands and kidneys appear normal. No hydronephrosis or renal obstruction is noted. Urinary bladder is displaced to the left by moderate size right-sided pelvic hematoma which appears to be slightly enlarged compared to prior exam.   Stomach/Bowel: The stomach is unremarkable. There is no evidence of bowel obstruction or inflammation. Status post appendectomy.   Vascular/Lymphatic: Aortic atherosclerosis. No enlarged abdominal or pelvic lymph nodes.   Reproductive: Status post hysterectomy. No adnexal masses.   Other: As noted above, moderate size right-sided retroperitoneal hematoma is noted which extends into right pelvic region, where it is mildly enlarged. Subcutaneous gas is noted in the right lower quadrant suggesting postsurgical change.   Musculoskeletal: No acute or significant osseous findings.   IMPRESSION: Moderate sized right retroperitoneal hematoma is noted which extends into right pelvic region and displaces urinary bladder to the left. The pelvic component appears to be mildly enlarged compared to prior exam.   Bilateral pleural effusions are noted, left greater than right, with associated atelectasis of the lower lobes.     Electronically Signed   By: Rosalene Colon M.D.   On: 04/22/2023 17:45 Within last 24 hrs: No results found.  Assessment    Soft readily reducible right lower quadrant/inguinal hernia.   Patient Active Problem List   Diagnosis Date Noted   S/P thoracentesis 05/07/2023   Thrombocytopenia (HCC) 05/06/2023   Acute kidney injury superimposed on stage 3a chronic kidney disease (HCC) 05/02/2023   Metabolic acidosis 05/02/2023   Overweight (BMI 25.0-29.9) 05/02/2023   HFrEF (heart failure with reduced ejection  fraction) (HCC) 04/23/2023   Infected wound 04/23/2023   CAD (coronary artery disease) 04/22/2023   Chronic kidney disease, stage 3a (HCC) 04/22/2023   Peritoneal hematoma 04/22/2023   Pleural effusion 04/22/2023   Abnormal LFTs 04/22/2023   Hypokalemia 04/22/2023   Atrial fibrillation, chronic (HCC) 04/22/2023   Diarrhea 04/22/2023   Tobacco abuse 04/13/2023   NSTEMI (non-ST elevated myocardial infarction) (HCC) 04/12/2023   Chest pain 04/12/2023   OAB (overactive bladder) 04/27/2022   Chronic diastolic heart failure (HCC) 09/27/2021   Insomnia 09/06/2021   Perforated appendicitis 08/31/2021   Carotid artery occlusion 02/07/2021   Cerebral atherosclerosis 02/07/2021   Hypertensive heart disease without congestive heart failure 02/07/2021   Occlusion and stenosis of bilateral carotid arteries 02/07/2021   Vitamin D  deficiency, unspecified 02/07/2021   Chronic anticoagulation 01/27/2021   Senile purpura (HCC) 12/03/2020   Prediabetes 10/08/2019   Stenosis of right carotid artery 10/03/2018   HLD (hyperlipidemia) 10/03/2018   Essential hypertension 10/02/2018   CKD (chronic kidney disease) stage 3, GFR 30-59 ml/min (HCC) 06/17/2018   Chronic forearm pain (Primary Area of Pain) (Left) 06/17/2018   Neurogenic pain 06/17/2018   Chronic pain syndrome 06/06/2018   Long term current use of opiate analgesic 06/06/2018    Plan    At present, minimally symptomatic, explained the role of observation and optimal timing to consider elective repair.  Will have them follow-up in 2 months.  I discussed possibility of incarceration, strangulation, enlargement in size over time,  and the need for emergency surgery in the face of these.  Also reviewed the techniques of reduction should incarceration occur, and when unsuccessful to present to the ED.  Also discussed that surgery risks include recurrence which can be up to 30% in the case of complex hernias, use of prosthetic materials (mesh) and the  increased risk of infection and the possible need for re-operation and removal of mesh, possibility of post-op SBO or ileus, and the risks of general anesthetic including heart attack, stroke, sudden death or some reaction to anesthetic medications. The patient, and those present, appear to understand the risks, any and all questions were answered to the patient's satisfaction.  No guarantees were ever expressed or implied.    Face-to-face time spent with the patient and accompanying care providers(if present) was 30 minutes, spent counseling, educating, and coordinating care of the patient.    These notes generated with voice recognition software. I apologize for typographical errors.  Flynn Hylan M.D., FACS 09/25/2023, 10:24 AM

## 2023-09-25 NOTE — Patient Instructions (Addendum)
 We will have you follow up here in 2 months to talk about surgical repair of your hernia.   Groin Hernia (Inguinal Hernia) in Adults: What to Know  A hernia happens when an organ or tissue in your body pushes out through a weak spot in the muscles of your belly. This makes a bulge. A groin hernia is also called an inguinal hernia. It's found in your groin, which is the area where your leg meets your lower belly. This kind of hernia could also be: In your scrotum, if you're female. In the folds of skin around your vagina, if you're female. You may be able to push the bulge back into your belly. If you can't push it in and blood flow is cut off to the hernia, you'll need surgery right away. What are the causes? A groin hernia may happen when you strain your belly muscles, such as when you: Lift a heavy object. Strain to poop. Cough. What increases the risk? You may be more likely to get a groin hernia if: You're female. You're 50 years or older. You're pregnant. You've had a groin hernia or belly surgery before. You smoke. You're overweight. You work at a job where you need to stand a lot or lift heavy things. What are the signs or symptoms? Symptoms may depend on how big the hernia is. If it's small, you may not have symptoms. If it's bigger, you may have: A bulge near your groin or genitals. Pain or burning in your groin. A dull ache or feeling of pressure in your groin. If blood flow is cut off to the tissues inside the hernia, you may also: Feel pain and tenderness when you touch the bulge. The skin over it may turn red or purple. Have a fever. Throw up or feel like you may throw up. Have trouble pooping or passing gas. How is this treated? Treatment depends on how big the hernia is and what symptoms you have. You may need: To be watched to see if the bulge grows bigger. Surgery. This may be done if the hernia is big or if you have symptoms. Follow these instructions at  home: Lifestyle Ask if it's OK for you to lift. Try not to stand for long periods of time. Do not smoke, vape, or use nicotine or tobacco. Stay at a healthy weight. Try not to do things that put pressure on your hernia. Preventing trouble pooping You may need to take these steps to help prevent or treat trouble pooping (constipation): Take medicines to help you poop. Eat foods high in fiber, like beans, whole grains, and fresh fruits and vegetables. Drink more fluids as told. General instructions Try to push the hernia back in place by very gently pressing on it while lying down. Do not try to force it back in if it won't push in easily. Watch your hernia for any changes in: Shape. Size. Color. Take your medicines only as told. Contact a doctor if: You have a fever. You have new symptoms. Your symptoms get worse. You can't poop or pass gas. Get help right away if: Your bulge: Starts to hurt a lot. Changes color. You have sudden pain in your scrotum, or your scrotum changes size. You can't gently push the hernia back in place. You feel like you may vomit, and that feeling does not go away. You keep throwing up or feeling like you need to throw up. These symptoms may be an emergency. Call 911 right away. Do  not wait to see if the symptoms will go away. Do not drive yourself to the hospital. This information is not intended to replace advice given to you by your health care provider. Make sure you discuss any questions you have with your health care provider. Document Revised: 01/04/2023 Document Reviewed: 01/04/2023 Elsevier Patient Education  2024 [employer].

## 2023-09-26 ENCOUNTER — Encounter: Payer: Self-pay | Admitting: Family Medicine

## 2023-09-27 ENCOUNTER — Ambulatory Visit: Admitting: Surgery

## 2023-09-27 MED ORDER — FENTANYL 75 MCG/HR TD PT72: 1.0000 | MEDICATED_PATCH | TRANSDERMAL | 0 refills | Status: AC

## 2023-10-02 ENCOUNTER — Telehealth: Payer: Self-pay

## 2023-10-02 ENCOUNTER — Ambulatory Visit: Payer: Medicare Other | Admitting: Dermatology

## 2023-10-02 DIAGNOSIS — L820 Inflamed seborrheic keratosis: Secondary | ICD-10-CM

## 2023-10-02 DIAGNOSIS — L578 Other skin changes due to chronic exposure to nonionizing radiation: Secondary | ICD-10-CM

## 2023-10-02 DIAGNOSIS — I872 Venous insufficiency (chronic) (peripheral): Secondary | ICD-10-CM

## 2023-10-02 DIAGNOSIS — L570 Actinic keratosis: Secondary | ICD-10-CM

## 2023-10-02 DIAGNOSIS — Z1283 Encounter for screening for malignant neoplasm of skin: Secondary | ICD-10-CM

## 2023-10-02 DIAGNOSIS — D229 Melanocytic nevi, unspecified: Secondary | ICD-10-CM

## 2023-10-02 DIAGNOSIS — Z8589 Personal history of malignant neoplasm of other organs and systems: Secondary | ICD-10-CM

## 2023-10-02 DIAGNOSIS — Z85828 Personal history of other malignant neoplasm of skin: Secondary | ICD-10-CM

## 2023-10-02 DIAGNOSIS — L814 Other melanin hyperpigmentation: Secondary | ICD-10-CM

## 2023-10-02 DIAGNOSIS — D692 Other nonthrombocytopenic purpura: Secondary | ICD-10-CM

## 2023-10-02 DIAGNOSIS — L817 Pigmented purpuric dermatosis: Secondary | ICD-10-CM

## 2023-10-02 NOTE — Telephone Encounter (Signed)
 Patient advised that Rx was sent and confirmation received by pharmacy on 09/27/23 at 5:08 pm   Copied from CRM #[id number]. Topic: Clinical - Prescription Issue >> Oct 01, 2023  1:59 PM Emylou G wrote: Reason for CRM: Patient called checking status of patches?  Number on file good

## 2023-10-02 NOTE — Progress Notes (Unsigned)
 Follow-Up Visit   Subjective  Dawn Bruce is a 78 y.o. female who presents for the following: Skin Cancer Screening and Full Body Skin Exam Hx of bcc, hx of scc, hx sccis  Some spots at face and around neck  Ak face, arms and hands Isk left cheek , arms and hands, right anterior neck  The patient presents for Total-Body Skin Exam (TBSE) for skin cancer screening and mole check. The patient has spots, moles and lesions to be evaluated, some may be new or changing and the patient may have concern these could be cancer.  The following portions of the chart were reviewed this encounter and updated as appropriate: medications, allergies, medical history  Review of Systems:  No other skin or systemic complaints except as noted in HPI or Assessment and Plan.  Objective  Well appearing patient in no apparent distress; mood and affect are within normal limits.  A full examination was performed including scalp, head, eyes, ears, nose, lips, neck, chest, axillae, abdomen, back, buttocks, bilateral upper extremities, bilateral lower extremities, hands, feet, fingers, toes, fingernails, and toenails. All findings within normal limits unless otherwise noted below.   Relevant physical exam findings are noted in the Assessment and Plan.  Head - Anterior (Face) Erythematous thin papules/macules with gritty scale.  Head - Anterior (Face) Erythematous stuck-on, waxy papule or plaque  Assessment & Plan   SKIN CANCER SCREENING PERFORMED TODAY.  ACTINIC DAMAGE - Chronic condition, secondary to cumulative UV/sun exposure - diffuse scaly erythematous macules with underlying dyspigmentation - Recommend daily broad spectrum sunscreen SPF 30+ to sun-exposed areas, reapply every 2 hours as needed.  - Staying in the shade or wearing long sleeves, sun glasses (UVA+UVB protection) and wide brim hats (4-inch brim around the entire circumference of the hat) are also recommended for sun protection.  - Call for  new or changing lesions.  LENTIGINES, SEBORRHEIC KERATOSES, HEMANGIOMAS - Benign normal skin lesions - Benign-appearing - Call for any changes  MELANOCYTIC NEVI - Tan-brown and/or pink-flesh-colored symmetric macules and papules - Benign appearing on exam today - Observation - Call clinic for new or changing moles - Recommend daily use of broad spectrum spf 30+ sunscreen to sun-exposed areas.  Purpura - Chronic; persistent and recurrent.  Treatable, but not curable. - Violaceous macules and patches - Benign - Related to trauma, age, sun damage and/or use of blood thinners, chronic use of topical and/or oral steroids - Observe - Can use OTC arnica containing moisturizer such as Dermend Bruise Formula if desired - Call for worsening or other concerns  STASIS DERMATITIS with [place] Purpura  Exam: Erythematous, scaly patches involving the ankle and distal lower leg with associated lower leg edema. Chronic and persistent condition with duration or expected duration over one year. Condition is symptomatic/ bothersome to patient. Not currently at goal. Stasis in the legs causes chronic leg swelling, which may result in itchy or painful rashes, skin discoloration, skin texture changes, and sometimes ulceration.  Recommend daily graduated compression hose/stockings- easiest to put on first thing in morning, remove at bedtime.  Elevate legs as much as possible. Avoid salt/sodium rich foods. Treatment Plan: No recommended treatment    HISTORY OF BASAL CELL CARCINOMA OF THE SKIN right upper back paraspinal 05/02/21  - No evidence of recurrence today - Recommend regular full body skin exams - Recommend daily broad spectrum sunscreen SPF 30+ to sun-exposed areas, reapply every 2 hours as needed.  - Call if any new or changing lesions are noted  between office visits   HISTORY OF SQUAMOUS CELL CARCINOMA IN SITU OF THE SKIN Multiple sites see history  08/28/2022 - right volar forearm treated  with ED&c - No evidence of recurrence today - Recommend regular full body skin exams - Recommend daily broad spectrum sunscreen SPF 30+ to sun-exposed areas, reapply every 2 hours as needed.  - Call if any new or changing lesions are noted between office visits   HISTORY OF SQUAMOUS CELL CARCINOMA OF THE SKIN  09/10/19 left dorsum wrist ED&C 10/28/19, right dorsum hand 03/04/20  - No evidence of recurrence today - No lymphadenopathy - Recommend regular full body skin exams - Recommend daily broad spectrum sunscreen SPF 30+ to sun-exposed areas, reapply every 2 hours as needed.  - Call if any new or changing lesions are noted between office visits  ACTINIC KERATOSIS Head - Anterior (Face) Patient would like to come back and have areas at face, arms and hands treated   Actinic keratoses are precancerous spots that appear secondary to cumulative UV radiation exposure/sun exposure over time. They are chronic with expected duration over 1 year. A portion of actinic keratoses will progress to squamous cell carcinoma of the skin. It is not possible to reliably predict which spots will progress to skin cancer and so treatment is recommended to prevent development of skin cancer.  Recommend daily broad spectrum sunscreen SPF 30+ to sun-exposed areas, reapply every 2 hours as needed.  Recommend staying in the shade or wearing long sleeves, sun glasses (UVA+UVB protection) and wide brim hats (4-inch brim around the entire circumference of the hat). Call for new or changing lesions. INFLAMED SEBORRHEIC KERATOSIS Head - Anterior (Face) Symptomatic, irritating, patient would like treated.  Patient would like to come back and have areas at left cheek, arms, hands, and right anterior treated then with Ln2 Return for end of june to treat isk and aks , 1 year tbse .  IRandee Busing, CMA, am acting as scribe for Celine Collard, MD.   Documentation: I have reviewed the above documentation for accuracy  and completeness, and I agree with the above.  Celine Collard, MD

## 2023-10-02 NOTE — Patient Instructions (Addendum)
 Can use OTC arnica containing moisturizer such as Dermend Bruise Formula if desired can use once or twice daily  - Call for worsening or other concerns   Seborrheic Keratosis  What causes seborrheic keratoses? Seborrheic keratoses are harmless, common skin growths that first appear during adult life.  As time goes by, more growths appear.  Some people may develop a large number of them.  Seborrheic keratoses appear on both covered and uncovered body parts.  They are not caused by sunlight.  The tendency to develop seborrheic keratoses can be inherited.  They vary in color from skin-colored to gray, brown, or even black.  They can be either smooth or have a rough, warty surface.   Seborrheic keratoses are superficial and look as if they were stuck on the skin.  Under the microscope this type of keratosis looks like layers upon layers of skin.  That is why at times the top layer may seem to fall off, but the rest of the growth remains and re-grows.    Treatment Seborrheic keratoses do not need to be treated, but can easily be removed in the office.  Seborrheic keratoses often cause symptoms when they rub on clothing or jewelry.  Lesions can be in the way of shaving.  If they become inflamed, they can cause itching, soreness, or burning.  Removal of a seborrheic keratosis can be accomplished by freezing, burning, or surgery. If any spot bleeds, scabs, or grows rapidly, please return to have it checked, as these can be an indication of a skin cancer.   Actinic keratoses are precancerous spots that appear secondary to cumulative UV radiation exposure/sun exposure over time. They are chronic with expected duration over 1 year. A portion of actinic keratoses will progress to squamous cell carcinoma of the skin. It is not possible to reliably predict which spots will progress to skin cancer and so treatment is recommended to prevent development of skin cancer.  Recommend daily broad spectrum sunscreen SPF  30+ to sun-exposed areas, reapply every 2 hours as needed.  Recommend staying in the shade or wearing long sleeves, sun glasses (UVA+UVB protection) and wide brim hats (4-inch brim around the entire circumference of the hat). Call for new or changing lesions.     Melanoma ABCDEs  Melanoma is the most dangerous type of skin cancer, and is the leading cause of death from skin disease.  You are more likely to develop melanoma if you: Have light-colored skin, light-colored eyes, or red or blond hair Spend a lot of time in the sun Tan regularly, either outdoors or in a tanning bed Have had blistering sunburns, especially during childhood Have a close family member who has had a melanoma Have atypical moles or large birthmarks  Early detection of melanoma is key since treatment is typically straightforward and cure rates are extremely high if we catch it early.   The first sign of melanoma is often a change in a mole or a new dark spot.  The ABCDE system is a way of remembering the signs of melanoma.  A for asymmetry:  The two halves do not match. B for border:  The edges of the growth are irregular. C for color:  A mixture of colors are present instead of an even brown color. D for diameter:  Melanomas are usually (but not always) greater than 6mm - the size of a pencil eraser. E for evolution:  The spot keeps changing in size, shape, and color.  Please check your skin  once per month between visits. You can use a small mirror in front and a large mirror behind you to keep an eye on the back side or your body.   If you see any new or changing lesions before your next follow-up, please call to schedule a visit.  Please continue daily skin protection including broad spectrum sunscreen SPF 30+ to sun-exposed areas, reapplying every 2 hours as needed when you're outdoors.   Staying in the shade or wearing long sleeves, sun glasses (UVA+UVB protection) and wide brim hats (4-inch brim around the  entire circumference of the hat) are also recommended for sun protection.    Due to recent changes in healthcare laws, you may see results of your pathology and/or laboratory studies on MyChart before the doctors have had a chance to review them. We understand that in some cases there may be results that are confusing or concerning to you. Please understand that not all results are received at the same time and often the doctors may need to interpret multiple results in order to provide you with the best plan of care or course of treatment. Therefore, we ask that you please give us  2 business days to thoroughly review all your results before contacting the office for clarification. Should we see a critical lab result, you will be contacted sooner.   If You Need Anything After Your Visit  If you have any questions or concerns for your doctor, please call our main line at [phone] and press option 4 to reach your doctor's medical assistant. If no one answers, please leave a voicemail as directed and we will return your call as soon as possible. Messages left after 4 pm will be answered the following business day.   You may also send us  a message via MyChart. We typically respond to MyChart messages within 1-2 business days.  For prescription refills, please ask your pharmacy to contact our office. Our fax number is [phone].  If you have an urgent issue when the clinic is closed that cannot wait until the next business day, you can page your doctor at the number below.    Please note that while we do our best to be available for urgent issues outside of office hours, we are not available 24/7.   If you have an urgent issue and are unable to reach us , you may choose to seek medical care at your doctor's office, retail clinic, urgent care center, or emergency room.  If you have a medical emergency, please immediately call 911 or go to the emergency department.  Pager Numbers  - Dr.  Bary Likes: [phone]  - Dr. Annette Barters: [phone]  - Dr. Felipe Horton: [phone]   In the event of inclement weather, please call our main line at [phone] for an update on the status of any delays or closures.  Dermatology Medication Tips: Please keep the boxes that topical medications come in in order to help keep track of the instructions about where and how to use these. Pharmacies typically print the medication instructions only on the boxes and not directly on the medication tubes.   If your medication is too expensive, please contact our office at [phone] option 4 or send us  a message through MyChart.   We are unable to tell what your co-pay for medications will be in advance as this is different depending on your insurance coverage. However, we may be able to find a substitute medication at lower cost or fill out paperwork to get insurance  to cover a needed medication.   If a prior authorization is required to get your medication covered by your insurance company, please allow us  1-2 business days to complete this process.  Drug prices often vary depending on where the prescription is filled and some pharmacies may offer cheaper prices.  The website www.goodrx.com contains coupons for medications through different pharmacies. The prices here do not account for what the cost may be with help from insurance (it may be cheaper with your insurance), but the website can give you the price if you did not use any insurance.  - You can print the associated coupon and take it with your prescription to the pharmacy.  - You may also stop by our office during regular business hours and pick up a GoodRx coupon card.  - If you need your prescription sent electronically to a different pharmacy, notify our office through [facility] or by phone at [phone] option 4.     Si Usted Necesita Algo Despus de Su Visita  Tambin puede enviarnos un mensaje a travs de Clinical cytogeneticist. Por lo  general respondemos a los mensajes de MyChart en el transcurso de 1 a 2 das hbiles.  Para renovar recetas, por favor pida a su farmacia que se ponga en contacto con nuestra oficina. Franz Jacks de fax es [place] [phone].  Si tiene un asunto urgente cuando la clnica est cerrada y que no puede esperar hasta el siguiente da hbil, puede llamar/localizar a su doctor(a) al nmero que aparece a continuacin.   Por favor, tenga en cuenta que aunque hacemos todo lo posible para estar disponibles para asuntos urgentes fuera del horario de [place], no estamos disponibles las 24 horas del da, los 7 [address]  [address] de la [place].   Si tiene un problema urgente y no puede comunicarse con nosotros, puede optar por buscar atencin mdica  en el consultorio de su doctor(a), en una clnica privada, en un centro de atencin urgente o en una sala de emergencias.  Si tiene Engineer, drilling, por favor llame inmediatamente al 911 o vaya a la sala de emergencias.  Nmeros de bper  - Dr. Bary Likes: [phone]  - Dra. Annette Barters: [phone]  - Dr. Felipe Horton: [phone]   En caso de inclemencias del tiempo, por favor llame a Lajuan Pila principal al [phone] para una actualizacin sobre el [place] de cualquier retraso o cierre.  Consejos para la medicacin en dermatologa: Por favor, guarde las cajas en las que vienen los medicamentos de uso tpico para ayudarle a seguir las instrucciones sobre dnde y cmo usarlos. Las farmacias generalmente imprimen las instrucciones del medicamento slo en las cajas y no directamente en los tubos del [place].   Si su medicamento es muy caro, por favor, pngase en contacto con Bettyjane Brunet llamando al [phone] y presione la opcin 4 o envenos un mensaje a travs de Clinical cytogeneticist.   No podemos decirle cul ser su copago por los medicamentos por adelantado ya que esto es diferente dependiendo de la cobertura de su seguro. Sin embargo, es posible que podamos encontrar  un medicamento sustituto a Audiological scientist un formulario para que el seguro cubra el medicamento que se considera necesario.   Si se requiere una autorizacin previa para que su compaa de seguros [place] su medicamento, por favor permtanos de 1 a 2 das hbiles para completar este proceso.  Los precios de los medicamentos varan con frecuencia dependiendo del Environmental consultant de dnde se surte la receta y Careers adviser pueden ofrecer  precios ms baratos.  El sitio web www.goodrx.com tiene cupones para medicamentos de Health and safety inspector. Los precios aqu no tienen en cuenta lo que podra costar con la ayuda del seguro (puede ser ms barato con su seguro), pero el sitio web puede darle el precio si no utiliz Tourist information centre manager.  - Puede imprimir el cupn correspondiente y llevarlo con su receta a la farmacia.  - Tambin puede pasar por nuestra oficina durante el horario de atencin regular y Education officer, museum una tarjeta de cupones de GoodRx.  - Si necesita que su receta se enve electrnicamente a una farmacia diferente, informe a nuestra oficina a travs de MyChart de [place] o por telfono llamando al [phone] y presione la opcin 4.

## 2023-10-03 ENCOUNTER — Encounter: Payer: Self-pay | Admitting: Dermatology

## 2023-10-03 DIAGNOSIS — R0902 Hypoxemia: Secondary | ICD-10-CM | POA: Diagnosis not present

## 2023-10-09 ENCOUNTER — Encounter (INDEPENDENT_AMBULATORY_CARE_PROVIDER_SITE_OTHER): Payer: Self-pay

## 2023-10-11 DIAGNOSIS — G5792 Unspecified mononeuropathy of left lower limb: Secondary | ICD-10-CM | POA: Diagnosis not present

## 2023-10-11 DIAGNOSIS — Z1331 Encounter for screening for depression: Secondary | ICD-10-CM | POA: Diagnosis not present

## 2023-10-11 DIAGNOSIS — R7309 Other abnormal glucose: Secondary | ICD-10-CM | POA: Diagnosis not present

## 2023-10-16 ENCOUNTER — Encounter (HOSPITAL_BASED_OUTPATIENT_CLINIC_OR_DEPARTMENT_OTHER): Payer: Self-pay

## 2023-10-16 ENCOUNTER — Other Ambulatory Visit: Payer: Self-pay

## 2023-10-16 ENCOUNTER — Encounter: Payer: Self-pay | Admitting: Family Medicine

## 2023-10-16 ENCOUNTER — Ambulatory Visit: Payer: Self-pay

## 2023-10-16 ENCOUNTER — Inpatient Hospital Stay (HOSPITAL_BASED_OUTPATIENT_CLINIC_OR_DEPARTMENT_OTHER)
Admission: EM | Admit: 2023-10-16 | Discharge: 2023-10-19 | DRG: 863 | Disposition: A | Discharge status: Home / Self Care | Attending: Internal Medicine | Admitting: Internal Medicine

## 2023-10-16 ENCOUNTER — Emergency Department (HOSPITAL_BASED_OUTPATIENT_CLINIC_OR_DEPARTMENT_OTHER)

## 2023-10-16 DIAGNOSIS — D631 Anemia in chronic kidney disease: Secondary | ICD-10-CM | POA: Diagnosis present

## 2023-10-16 DIAGNOSIS — Z7901 Long term (current) use of anticoagulants: Secondary | ICD-10-CM | POA: Diagnosis not present

## 2023-10-16 DIAGNOSIS — I5020 Unspecified systolic (congestive) heart failure: Secondary | ICD-10-CM | POA: Diagnosis present

## 2023-10-16 DIAGNOSIS — G894 Chronic pain syndrome: Secondary | ICD-10-CM | POA: Diagnosis not present

## 2023-10-16 DIAGNOSIS — Z7902 Long term (current) use of antithrombotics/antiplatelets: Secondary | ICD-10-CM

## 2023-10-16 DIAGNOSIS — K4090 Unilateral inguinal hernia, without obstruction or gangrene, not specified as recurrent: Secondary | ICD-10-CM | POA: Diagnosis present

## 2023-10-16 DIAGNOSIS — I252 Old myocardial infarction: Secondary | ICD-10-CM | POA: Diagnosis not present

## 2023-10-16 DIAGNOSIS — I130 Hypertensive heart and chronic kidney disease with heart failure and stage 1 through stage 4 chronic kidney disease, or unspecified chronic kidney disease: Secondary | ICD-10-CM | POA: Diagnosis not present

## 2023-10-16 DIAGNOSIS — R933 Abnormal findings on diagnostic imaging of other parts of digestive tract: Secondary | ICD-10-CM | POA: Diagnosis not present

## 2023-10-16 DIAGNOSIS — E785 Hyperlipidemia, unspecified: Secondary | ICD-10-CM | POA: Diagnosis present

## 2023-10-16 DIAGNOSIS — Z85828 Personal history of other malignant neoplasm of skin: Secondary | ICD-10-CM

## 2023-10-16 DIAGNOSIS — Z955 Presence of coronary angioplasty implant and graft: Secondary | ICD-10-CM

## 2023-10-16 DIAGNOSIS — N1832 Chronic kidney disease, stage 3b: Secondary | ICD-10-CM | POA: Diagnosis present

## 2023-10-16 DIAGNOSIS — T8149XA Infection following a procedure, other surgical site, initial encounter: Principal | ICD-10-CM | POA: Diagnosis present

## 2023-10-16 DIAGNOSIS — N183 Chronic kidney disease, stage 3 unspecified: Secondary | ICD-10-CM | POA: Diagnosis present

## 2023-10-16 DIAGNOSIS — D696 Thrombocytopenia, unspecified: Secondary | ICD-10-CM | POA: Diagnosis present

## 2023-10-16 DIAGNOSIS — I2583 Coronary atherosclerosis due to lipid rich plaque: Secondary | ICD-10-CM

## 2023-10-16 DIAGNOSIS — I480 Paroxysmal atrial fibrillation: Secondary | ICD-10-CM | POA: Diagnosis present

## 2023-10-16 DIAGNOSIS — R935 Abnormal findings on diagnostic imaging of other abdominal regions, including retroperitoneum: Secondary | ICD-10-CM | POA: Diagnosis not present

## 2023-10-16 DIAGNOSIS — Z7984 Long term (current) use of oral hypoglycemic drugs: Secondary | ICD-10-CM

## 2023-10-16 DIAGNOSIS — I2510 Atherosclerotic heart disease of native coronary artery without angina pectoris: Secondary | ICD-10-CM | POA: Diagnosis not present

## 2023-10-16 DIAGNOSIS — Z79891 Long term (current) use of opiate analgesic: Secondary | ICD-10-CM | POA: Diagnosis not present

## 2023-10-16 DIAGNOSIS — I5022 Chronic systolic (congestive) heart failure: Secondary | ICD-10-CM | POA: Diagnosis not present

## 2023-10-16 DIAGNOSIS — Z87891 Personal history of nicotine dependence: Secondary | ICD-10-CM | POA: Diagnosis not present

## 2023-10-16 DIAGNOSIS — G629 Polyneuropathy, unspecified: Secondary | ICD-10-CM | POA: Diagnosis present

## 2023-10-16 DIAGNOSIS — Z79899 Other long term (current) drug therapy: Secondary | ICD-10-CM

## 2023-10-16 DIAGNOSIS — E782 Mixed hyperlipidemia: Secondary | ICD-10-CM

## 2023-10-16 DIAGNOSIS — L03115 Cellulitis of right lower limb: Principal | ICD-10-CM | POA: Diagnosis present

## 2023-10-16 DIAGNOSIS — I160 Hypertensive urgency: Secondary | ICD-10-CM | POA: Diagnosis present

## 2023-10-16 DIAGNOSIS — L03314 Cellulitis of groin: Secondary | ICD-10-CM | POA: Diagnosis not present

## 2023-10-16 DIAGNOSIS — Y838 Other surgical procedures as the cause of abnormal reaction of the patient, or of later complication, without mention of misadventure at the time of the procedure: Secondary | ICD-10-CM | POA: Diagnosis present

## 2023-10-16 DIAGNOSIS — Z86008 Personal history of in-situ neoplasm of other site: Secondary | ICD-10-CM

## 2023-10-16 DIAGNOSIS — Z90710 Acquired absence of both cervix and uterus: Secondary | ICD-10-CM

## 2023-10-16 LAB — BASIC METABOLIC PANEL WITH GFR
Anion gap: 13 (ref 5–15)
BUN: 22 mg/dL (ref 8–23)
CO2: 22 mmol/L (ref 22–32)
Calcium: 9.5 mg/dL (ref 8.9–10.3)
Chloride: 103 mmol/L (ref 98–111)
Creatinine, Ser: 1.25 mg/dL — ABNORMAL HIGH (ref 0.44–1.00)
GFR, Estimated: 44 mL/min — ABNORMAL LOW (ref >60)
Glucose, Bld: 93 mg/dL (ref 70–99)
Potassium: 4.7 mmol/L (ref 3.5–5.1)
Sodium: 138 mmol/L (ref 135–145)

## 2023-10-16 LAB — CBC WITH DIFFERENTIAL/PLATELET
Abs Immature Granulocytes: 0.03 10*3/uL (ref 0.00–0.07)
Basophils Absolute: 0 10*3/uL (ref 0.0–0.1)
Basophils Relative: 0 %
Eosinophils Absolute: 0.3 10*3/uL (ref 0.0–0.5)
Eosinophils Relative: 3 %
HCT: 39.3 % (ref 36.0–46.0)
Hemoglobin: 12 g/dL (ref 12.0–15.0)
Immature Granulocytes: 0 %
Lymphocytes Relative: 6 %
Lymphs Abs: 0.5 10*3/uL — ABNORMAL LOW (ref 0.7–4.0)
MCH: 24.6 pg — ABNORMAL LOW (ref 26.0–34.0)
MCHC: 30.5 g/dL (ref 30.0–36.0)
MCV: 80.5 fL (ref 80.0–100.0)
Monocytes Absolute: 0.9 10*3/uL (ref 0.1–1.0)
Monocytes Relative: 10 %
Neutro Abs: 7.2 10*3/uL (ref 1.7–7.7)
Neutrophils Relative %: 81 %
Platelets: 149 10*3/uL — ABNORMAL LOW (ref 150–400)
RBC: 4.88 MIL/uL (ref 3.87–5.11)
RDW: 19.2 % — ABNORMAL HIGH (ref 11.5–15.5)
WBC: 8.9 10*3/uL (ref 4.0–10.5)
nRBC: 0 % (ref 0.0–0.2)

## 2023-10-16 LAB — LACTIC ACID, PLASMA: Lactic Acid, Venous: 1 mmol/L (ref 0.5–1.9)

## 2023-10-16 MED ORDER — PIPERACILLIN-TAZOBACTAM 3.375 G IVPB 30 MIN: 3.3750 g | Freq: Once | INTRAVENOUS | Status: DC

## 2023-10-16 MED ORDER — CARVEDILOL 6.25 MG PO TABS
6.2500 mg | ORAL_TABLET | Freq: Two times a day (BID) | ORAL | Status: DC
  Administered 2023-10-17 – 2023-10-19 (×6): 6.25 mg via ORAL
  Filled 2023-10-16 (×6): qty 1

## 2023-10-16 MED ORDER — ACETAMINOPHEN 325 MG PO TABS: 650.0000 mg | ORAL_TABLET | Freq: Four times a day (QID) | ORAL | Status: DC | PRN

## 2023-10-16 MED ORDER — IOHEXOL 300 MG/ML  SOLN
100.0000 mL | Freq: Once | INTRAMUSCULAR | Status: AC | PRN
  Administered 2023-10-16: 100 mL via INTRAVENOUS

## 2023-10-16 MED ORDER — ONDANSETRON HCL 4 MG PO TABS: 4.0000 mg | ORAL_TABLET | Freq: Four times a day (QID) | ORAL | Status: DC | PRN

## 2023-10-16 MED ORDER — ALBUTEROL SULFATE (2.5 MG/3ML) 0.083% IN NEBU: 2.5000 mg | INHALATION_SOLUTION | Freq: Four times a day (QID) | RESPIRATORY_TRACT | Status: DC | PRN

## 2023-10-16 MED ORDER — SODIUM CHLORIDE 0.9 % IV SOLN
2.0000 g | Freq: Once | INTRAVENOUS | Status: AC
  Administered 2023-10-16: 2 g via INTRAVENOUS
  Filled 2023-10-16: qty 12.5

## 2023-10-16 MED ORDER — PREGABALIN 25 MG PO CAPS
150.0000 mg | ORAL_CAPSULE | Freq: Three times a day (TID) | ORAL | Status: DC
  Administered 2023-10-17 – 2023-10-19 (×8): 150 mg via ORAL
  Filled 2023-10-16 (×8): qty 2

## 2023-10-16 MED ORDER — ONDANSETRON HCL 4 MG/2ML IJ SOLN: 4.0000 mg | Freq: Four times a day (QID) | INTRAMUSCULAR | Status: DC | PRN

## 2023-10-16 MED ORDER — HYDRALAZINE HCL 20 MG/ML IJ SOLN
2.0000 mg | Freq: Once | INTRAMUSCULAR | Status: AC
  Administered 2023-10-16: 2 mg via INTRAVENOUS
  Filled 2023-10-16: qty 1

## 2023-10-16 MED ORDER — HYDRALAZINE HCL 20 MG/ML IJ SOLN
10.0000 mg | INTRAMUSCULAR | Status: DC | PRN
  Administered 2023-10-17: 10 mg via INTRAVENOUS
  Filled 2023-10-16: qty 1

## 2023-10-16 MED ORDER — APIXABAN 2.5 MG PO TABS
2.5000 mg | ORAL_TABLET | Freq: Two times a day (BID) | ORAL | Status: DC
  Administered 2023-10-17 – 2023-10-19 (×6): 2.5 mg via ORAL
  Filled 2023-10-16 (×6): qty 1

## 2023-10-16 MED ORDER — METRONIDAZOLE 500 MG/100ML IV SOLN
500.0000 mg | Freq: Once | INTRAVENOUS | Status: AC
  Administered 2023-10-16: 500 mg via INTRAVENOUS
  Filled 2023-10-16: qty 100

## 2023-10-16 MED ORDER — VANCOMYCIN HCL IN DEXTROSE 1-5 GM/200ML-% IV SOLN: 1000.0000 mg | Freq: Once | INTRAVENOUS | Status: DC

## 2023-10-16 MED ORDER — VANCOMYCIN HCL IN DEXTROSE 1-5 GM/200ML-% IV SOLN
1000.0000 mg | Freq: Once | INTRAVENOUS | Status: AC
  Administered 2023-10-16: 1000 mg via INTRAVENOUS
  Filled 2023-10-16: qty 200

## 2023-10-16 MED ORDER — SODIUM CHLORIDE 0.9 % IV BOLUS
500.0000 mL | Freq: Once | INTRAVENOUS | Status: AC
  Administered 2023-10-16: 500 mL via INTRAVENOUS

## 2023-10-16 MED ORDER — ACETAMINOPHEN 650 MG RE SUPP: 650.0000 mg | Freq: Four times a day (QID) | RECTAL | Status: DC | PRN

## 2023-10-16 MED ORDER — TRAZODONE HCL 100 MG PO TABS
100.0000 mg | ORAL_TABLET | Freq: Every evening | ORAL | Status: DC | PRN
  Administered 2023-10-17 – 2023-10-18 (×3): 100 mg via ORAL
  Filled 2023-10-16 (×3): qty 1

## 2023-10-16 MED ORDER — SODIUM CHLORIDE 0.9% FLUSH
3.0000 mL | Freq: Two times a day (BID) | INTRAVENOUS | Status: DC
  Administered 2023-10-17 – 2023-10-19 (×5): 3 mL via INTRAVENOUS

## 2023-10-16 NOTE — H&P (Incomplete)
 History and Physical    Patient: Dawn Bruce WNU:[id number] DOB: [birth year] DOA: 10/16/2023 DOS: the patient was seen and examined on 10/16/2023 PCP: Mazie Speed, MD  Patient coming from: Transfer from   Chief Complaint:  Chief Complaint  Patient presents with  . Wound Check   HPI: Dawn Bruce is a 78 y.o. 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  Redness on the thigh began 2 days ago on Sunday.  She was prescribed dicloxacillin at that time.  Despite starting antibiotics on Sunday, the redness continued to spread beyond the area that was marked by her granddaughter.  Patient reports having a chronic wound since she underwent left heart cath back in 03/2023.  Following the procedure patient developed retroperitoneal hematoma and hemorrhagic shock due to complication of iliac artery laceration during left heart cath requiring surgical repair of external iliac artery.  Patient reports that the wound has been in healing and the size of a nickel.  Denies any drainage from the wound.  No fevers, chills, nausea, vomiting, diarrhea, or increased drainage from the wound.  In the emergency department patient was noted to be afebrile with blood pressures initially elevated up to 202/97, but all other vital signs maintained.  Labs noted WBC 8.9, hemoglobin 12, platelets 149, BUN 22, creatinine 1.25, and lactic acid 1.    CT  abdomen pelvis showed: 1. Skin thickening and soft tissue irregularity in the right inguinal region, overall measuring 3.9 x 3.3 x 5 (APxTRxCC). This could represent combination of postsurgical change with cellulitis. No well-formed abscess or drainable fluid collection. 2. Along the posterior aspect of the cecum, there is a fluid-filled structure with mucosal hyperenhancement, measuring 2.2 cm (axial 46), with adjacent stranding. While the adjacent stranding could be chronic changes related to the remote retroperitoneal hematoma, this is more worrisome for an acute process, such as acute diverticulitis. Alternatively, in the setting of a prior appendectomy, fluid trapped within the appendiceal stump, due to stump appendicitis could also have this appearance. Nonemergent colonoscopy recommended to exclude underlying neoplasm. 3. Small left pleural effusion.    In the ED patient has been treated with cefepime , vancomycin  and metronidazole ., 500 mL of NS bolus.   As there is nonspecific finding regarding the posterior aspect of the cecum General Surgery Dr. Aniceto Barley has been consulted.   Review of Systems: As mentioned in the history of present illness. All other systems reviewed and are negative. Past Medical History:  Diagnosis Date  . Actinic keratosis   . Chronic pain   . GERD (gastroesophageal reflux disease) ?   Taking Pepcid   . History of basal cell carcinoma (BCC) 05/02/2021   right upper back paraspinal  . History of SCC (squamous cell carcinoma) of skin 09/10/2019   left dorsum wrist ED&C done 10/28/19  . History of SCC (squamous cell carcinoma) of skin 03/04/2020   right dorsum hand  . History of squamous cell carcinoma in situ (SCCIS) 03/04/2020   left dorsum wrist  ED&C  . History of squamous cell carcinoma in situ (  SCCIS) 03/04/2020   right medial infraorbital  ED&C 04/28/2020  . History of squamous cell carcinoma in situ (SCCIS) 10/28/2019   right dorsum hand proximal  lateral  . History of squamous cell carcinoma in situ (SCCIS) 10/28/2019   right dorsum hand proximal medial  . Hyperlipidemia   . Hypertension   . NSTEMI (non-ST elevated myocardial infarction) (HCC) 04/12/2023  . Renal disorder   . Squamous cell carcinoma in situ 10/31/2021   left distal  tricep, EDC  . Squamous cell carcinoma of skin 08/28/2022   Right volar forearm - EDC   Past Surgical History:  Procedure Laterality Date  . ABDOMINAL HYSTERECTOMY    . APPENDECTOMY  April 2023   Burst appendix  . APPLICATION OF WOUND VAC Right 04/25/2023   Procedure: APPLICATION OF WOUND VAC;  Surgeon: Jackquelyn Mass, MD;  Location: [facility] ORS;  Service: Vascular;  Laterality: Right;  . APPLICATION OF WOUND VAC Right 04/27/2023   Procedure: WOUND VAC EXCHANGE;  Surgeon: Jackquelyn Mass, MD;  Location: [facility] OR[facility];  Service: Vascular;  Laterality: Right;  . APPLICATION OF WOUND VAC Right 05/01/2023   Procedure: APPLICATION OF WOUND VAC;  Surgeon: Jackquelyn Mass, MD;  Location: [facility] ORS;  Service: Vascular;  Laterality: Right;  . AUGMENTATION MAMMAPLASTY Bilateral    25-30 years ago  . CARDIOVERSION N/A 12/01/2020   Procedure: CARDIOVERSION;  Surgeon: Constancia Delton, MD;  Location: [facility] ORS;  Service: Cardiovascular;  Laterality: N/A;  . CAROTID ARTERY ANGIOPLASTY Right 1995(approximate)  . CAROTID ENDARTERECTOMY  2001  . CORONARY STENT INTERVENTION N/A 04/13/2023   Procedure: CORONARY STENT INTERVENTION;  Surgeon: Percival Brace, MD;  Location: [facility] INVASIVE [facility] LAB;  Service: Cardiovascular;  Laterality: N/A;  . COSMETIC SURGERY    . ELBOW SURGERY Left   . ENDARTERECTOMY FEMORAL Right 04/13/2023   Procedure: Right groin exploration with repair of right external iliac artery and right circumflex artery;  Surgeon: Lesta Rater, MD;  Location: [facility] OR[facility];  Service: Vascular;  Laterality: Right;  . FRACTURE SURGERY  2016   Arm  . INCISION AND DRAINAGE OF WOUND Right 04/25/2023    Procedure: IRRIGATION AND DEBRIDEMENT WOUND;  Surgeon: Jackquelyn Mass, MD;  Location: [facility];  Service: Vascular;  Laterality: Right;  . INCISION AND DRAINAGE OF WOUND Right 04/27/2023   Procedure: IRRIGATION AND DEBRIDEMENT WOUND;  Surgeon: Jackquelyn Mass, MD;  Location: [facility];  Service: Vascular;  Laterality: Right;  . INCISION AND DRAINAGE OF WOUND Right 05/01/2023   Procedure: IRRIGATION AND DEBRIDEMENT WOUND;  Surgeon: Jackquelyn Mass, MD;  Location: [facility];  Service: Vascular;  Laterality: Right;  . LEFT HEART CATH AND CORONARY ANGIOGRAPHY N/A 04/13/2023   Procedure: LEFT HEART CATH AND CORONARY ANGIOGRAPHY;  Surgeon: Percival Brace, MD;  Location: [facility];  Service: Cardiovascular;  Laterality: N/A;  . THORACENTESIS N/A 12/27/2020   Procedure: THORACENTESIS;  Surgeon: Prudy Brownie, DO;  Location: [facility];  Service: Pulmonary;  Laterality: N/A;  . TONSILLECTOMY    . TUBAL LIGATION  ?   Social History:  reports that she quit smoking about 35 years ago. Her smoking use included cigarettes. She started smoking about 50 years ago. She has a 3.8 pack-year smoking history. She has been exposed to tobacco smoke. She has never used smokeless tobacco. She reports current alcohol  use of about 14.0 standard drinks of alcohol  per week. She reports that she does not use drugs.  No Known Allergies  Family History  Problem  Relation Age of Onset  . Cancer Mother   . Cancer Father   . Cancer Sister   . Breast cancer Neg Hx     Prior to Admission medications   Medication Sig Start Date End Date Taking? Authorizing Provider  dicloxacillin (DYNAPEN) 500 MG capsule Take 500 mg by mouth 4 (four) times daily.   Yes [provider]  apixaban  (ELIQUIS ) 2.5 MG TABS tablet Take 2.5 mg by mouth 2 (two) times daily.    [provider]  atorvastatin  (LIPITOR ) 40 MG tablet Take 1 tablet (40 mg total) by mouth daily. 06/08/23 07/08/23  Bacigalupo, Angela  M, MD  carvedilol  (COREG ) 12.5 MG tablet Take 1 tablet (12.5 mg total) by mouth 2 (two) times daily with a meal. Patient taking differently: Take 6.25 mg by mouth 2 (two) times daily with a meal. 06/25/23   Bacigalupo, Stan Eans, MD  clopidogrel  (PLAVIX ) 75 MG tablet Take 1 tablet (75 mg total) by mouth daily. 04/02/23   Bacigalupo, Angela M, MD  empagliflozin (JARDIANCE) 10 MG TABS tablet Take 10 mg by mouth daily.    [provider]  ezetimibe  (ZETIA ) 10 MG tablet TAKE 1 TABLET(10 MG) BY MOUTH DAILY 04/05/23   Bacigalupo, Stan Eans, MD  fentaNYL  (DURAGESIC ) 75 MCG/HR Place 1 patch onto the skin every 3 (three) days. 09/27/23   Mazie Speed, MD  pregabalin  (LYRICA ) 150 MG capsule Take 1 capsule (150 mg total) by mouth 3 (three) times daily. 09/03/23   Mazie Speed, MD    Physical Exam: Vitals:   10/16/23 2100 10/16/23 2130 10/16/23 2221 10/16/23 2227  BP: (!) 151/54 (!) 147/58 (!) 202/97 (!) 151/121  Pulse: 76 76  76  Resp:    18  Temp:    (!) 97.4 F (36.3 C)  TempSrc:      SpO2: 97% 97%  100%  Weight:      Height:       Exam Constitutional: NAD, calm, comfortable Eyes: PERRL, lids and conjunctivae normal ENMT: Mucous membranes are moist. Posterior pharynx clear of any exudate or lesions.Normal dentition.  Neck: normal, supple, no masses, no thyromegaly Respiratory: clear to auscultation bilaterally, no wheezing, no crackles. Normal respiratory effort. No accessory muscle use.  Cardiovascular: Regular rate and rhythm, no murmurs / rubs / gallops. No extremity edema. 2+ pedal pulses. No carotid bruits.  Abdomen: no tenderness, no masses palpated. No hepatosplenomegaly. Bowel sounds positive.  Musculoskeletal: no clubbing / cyanosis. No joint deformity upper and lower extremities. Good ROM, no contractures. Normal muscle tone.  Skin: no rashes, lesions, ulcers. No induration Neurologic: CN 2-12 grossly intact. Sensation intact, DTR normal. Strength 5/5 in all 4.   Psychiatric: Normal judgment and insight. Alert and oriented x 3. Normal mood.   Data Reviewed: {Tip this will not be part of the note when signed- Document your independent interpretation of telemetry tracing, EKG, lab, Radiology test or any other diagnostic tests. Add any new diagnostic test ordered today. (Optional):26781} Reviewed labs, imaging, and pertinent records as documented.  Assessment and Plan:  Cellulitis of the right thigh Patient presents with 2-day history of progressive redness of her right thigh despite being on dicloxacillin.  Denies having any fever or chills.  White blood cell count within normal limits.  CT imaging revealed skin thickening and soft tissue irregularity in the right inguinal region, overall measuring 3.9 x 3.3 x 5 cm.  No blood cultures were obtained prior to patient being started on empiric antibiotics of vancomycin ,  metronidazole , and cefepime . - Admit to a medical telemetry bed - Cellulitis order set utilized - Continue vancomycin  and cefepime .  De-escalate when medically appropriate   Hypertensive urgency On admission blood pressures noted to be elevated up to 202/97. - Continue home blood pressure regimen - Hydralazine  IV as needed  Paroxysmal atrial fibrillation  Appears to be in sinus rhythm at this time. - Continue Eliquis   Chronic kidney disease stage IIIb Creatinine noted to be 1.25 which appears near patient's baseline. - Continue to monitor  DVT prophylaxis: Eliquis   Advance Care Planning:   Code Status: Prior ***  Consults: ***  Family Communication: ***  Severity of Illness: {Observation/Inpatient:21159}  Author: Lena Qualia, MD 10/16/2023 10:54 PM  For on call review [email or web address].

## 2023-10-16 NOTE — ED Notes (Signed)
 Call to daughter to update her that pt has an assigned bed.

## 2023-10-16 NOTE — Telephone Encounter (Signed)
FYI please see the message below.

## 2023-10-16 NOTE — ED Notes (Signed)
 Daughter Julieta Ogles request a call when patient has a room

## 2023-10-16 NOTE — Consult Note (Signed)
 Reason for Consult/Chief Complaint: ?appendicitis vs diverticulitis Consultant: Colonel Dears, MD  Dawn Bruce is an 78 y.o. female.   HPI: 68F with a history of perforated appendicitis managed nonoperatively and with drain placement in [place] 3y ago and with a recent history of a vascular procedure with resultant R groin wound that underwent I&D by general surgery in [place] with healing by secondary intention. She presents today with pain and redness of the right upper thigh. She was started on dicloxacillin 5/25. I was consulted for a CT with a read concerning for appendicitis or cecal diverticulitis. The patient denies any nausea, vomiting, or abdominal pain. Her WBC is normal. She is unsure when her last colonoscopy was and reports not having had one after her episode of appendicitis managed nonoperatively. She has a known inguinal hernia being followed by a Development worker, international aid in [place] with a plan for elective repair after her groin wound is healed. She was admitted by the internal medicine service for cellulitis.    Past Medical History:  Diagnosis Date   Actinic keratosis    Chronic pain    GERD (gastroesophageal reflux disease) ?   Taking Pepcid    History of basal cell carcinoma (BCC) 05/02/2021   right upper back paraspinal   History of SCC (squamous cell carcinoma) of skin 09/10/2019   left dorsum wrist ED&C done 10/28/19   History of SCC (squamous cell carcinoma) of skin 03/04/2020   right dorsum hand   History of squamous cell carcinoma in situ (SCCIS) 03/04/2020   left dorsum wrist  ED&C   History of squamous cell carcinoma in situ (SCCIS) 03/04/2020   right medial infraorbital  ED&C 04/28/2020   History of squamous cell carcinoma in situ (SCCIS) 10/28/2019   right dorsum hand proximal lateral   History of squamous cell carcinoma in situ (SCCIS) 10/28/2019   right dorsum hand proximal medial   Hyperlipidemia    Hypertension    NSTEMI (non-ST elevated myocardial  infarction) (HCC) 04/12/2023   Renal disorder    Squamous cell carcinoma in situ 10/31/2021   left distal  tricep, EDC   Squamous cell carcinoma of skin 08/28/2022   Right volar forearm - EDC    Past Surgical History:  Procedure Laterality Date   ABDOMINAL HYSTERECTOMY     APPENDECTOMY  April 2023   Burst appendix   APPLICATION OF WOUND VAC Right 04/25/2023   Procedure: APPLICATION OF WOUND VAC;  Surgeon: Jackquelyn Mass, MD;  Location: [facility];  Service: Vascular;  Laterality: Right;   APPLICATION OF WOUND VAC Right 04/27/2023   Procedure: WOUND VAC EXCHANGE;  Surgeon: Jackquelyn Mass, MD;  Location: [facility];  Service: Vascular;  Laterality: Right;   APPLICATION OF WOUND VAC Right 05/01/2023   Procedure: APPLICATION OF WOUND VAC;  Surgeon: Jackquelyn Mass, MD;  Location: [facility];  Service: Vascular;  Laterality: Right;   AUGMENTATION MAMMAPLASTY Bilateral    25-30 years ago   CARDIOVERSION N/A 12/01/2020   Procedure: CARDIOVERSION;  Surgeon: Constancia Delton, MD;  Location: [facility];  Service: Cardiovascular;  Laterality: N/A;   CAROTID ARTERY ANGIOPLASTY Right 1995(approximate)   CAROTID ENDARTERECTOMY  2001   CORONARY STENT INTERVENTION N/A 04/13/2023   Procedure: CORONARY STENT INTERVENTION;  Surgeon: Percival Brace, MD;  Location: [facility];  Service: Cardiovascular;  Laterality: N/A;   COSMETIC SURGERY     ELBOW SURGERY Left    ENDARTERECTOMY FEMORAL Right 04/13/2023   Procedure: Right groin exploration with repair of right  external iliac artery and right circumflex artery;  Surgeon: Lesta Rater, MD;  Location: [facility];  Service: Vascular;  Laterality: Right;   FRACTURE SURGERY  2016   Arm   INCISION AND DRAINAGE OF WOUND Right 04/25/2023   Procedure: IRRIGATION AND DEBRIDEMENT WOUND;  Surgeon: Jackquelyn Mass, MD;  Location: [facility];  Service: Vascular;  Laterality: Right;   INCISION AND DRAINAGE OF WOUND Right 04/27/2023   Procedure:  IRRIGATION AND DEBRIDEMENT WOUND;  Surgeon: Jackquelyn Mass, MD;  Location: [facility];  Service: Vascular;  Laterality: Right;   INCISION AND DRAINAGE OF WOUND Right 05/01/2023   Procedure: IRRIGATION AND DEBRIDEMENT WOUND;  Surgeon: Jackquelyn Mass, MD;  Location: [facility];  Service: Vascular;  Laterality: Right;   LEFT HEART CATH AND CORONARY ANGIOGRAPHY N/A 04/13/2023   Procedure: LEFT HEART CATH AND CORONARY ANGIOGRAPHY;  Surgeon: Percival Brace, MD;  Location: [facility];  Service: Cardiovascular;  Laterality: N/A;   THORACENTESIS N/A 12/27/2020   Procedure: Antoine Kirsch;  Surgeon: Prudy Brownie, DO;  Location: [facility];  Service: Pulmonary;  Laterality: N/A;   TONSILLECTOMY     TUBAL LIGATION  ?    Family History  Problem Relation Age of Onset   Cancer Mother    Cancer Father    Cancer Sister    Breast cancer Neg Hx     Social History:  reports that she quit smoking about 35 years ago. Her smoking use included cigarettes. She started smoking about 50 years ago. She has a 3.8 pack-year smoking history. She has been exposed to tobacco smoke. She has never used smokeless tobacco. She reports current alcohol  use of about 14.0 standard drinks of alcohol  per week. She reports that she does not use drugs.  Allergies: No Known Allergies  Medications: I have reviewed the patient's current medications.  Results for orders placed or performed during the hospital encounter of 10/16/23 (from the past 48 hours)  Basic metabolic panel     Status: Abnormal   Collection Time: 10/16/23  1:33 PM  Result Value Ref Range   Sodium 138 135 - 145 mmol/L   Potassium 4.7 3.5 - 5.1 mmol/L   Chloride 103 98 - 111 mmol/L   CO2 22 22 - 32 mmol/L   Glucose, Bld 93 70 - 99 mg/dL    Comment: Glucose reference range applies only to samples taken after fasting for at least 8 hours.   BUN 22 8 - 23 mg/dL   Creatinine, Ser 1.61 (H) 0.44 - 1.00 mg/dL   Calcium  9.5 8.9 - 10.3 mg/dL    GFR, Estimated 44 (L) >60 mL/min    Comment: (NOTE) Calculated using the CKD-EPI Creatinine Equation (2021)    Anion gap 13 5 - 15    Comment: Performed at [facility], [address] [place], [place] [address]  CBC with Differential     Status: Abnormal   Collection Time: 10/16/23  1:33 PM  Result Value Ref Range   WBC 8.9 4.0 - 10.5 K/uL   RBC 4.88 3.87 - 5.11 MIL/uL   Hemoglobin 12.0 12.0 - 15.0 g/dL   HCT 54.0 98.1 - 19.1 %   MCV 80.5 80.0 - 100.0 fL   MCH 24.6 (L) 26.0 - 34.0 pg   MCHC 30.5 30.0 - 36.0 g/dL   RDW 47.8 (H) 29.5 - 62.1 %   Platelets 149 (L) 150 - 400 K/uL    Comment: SPECIMEN CHECKED FOR CLOTS REPEATED TO VERIFY  nRBC 0.0 0.0 - 0.2 %   Neutrophils Relative % 81 %   Neutro Abs 7.2 1.7 - 7.7 K/uL   Lymphocytes Relative 6 %   Lymphs Abs 0.5 (L) 0.7 - 4.0 K/uL   Monocytes Relative 10 %   Monocytes Absolute 0.9 0.1 - 1.0 K/uL   Eosinophils Relative 3 %   Eosinophils Absolute 0.3 0.0 - 0.5 K/uL   Basophils Relative 0 %   Basophils Absolute 0.0 0.0 - 0.1 K/uL   Immature Granulocytes 0 %   Abs Immature Granulocytes 0.03 0.00 - 0.07 K/uL    Comment: Performed at [facility], [address] [place], [place] [address]  Lactic acid, plasma     Status: None   Collection Time: 10/16/23  1:33 PM  Result Value Ref Range   Lactic Acid, Venous 1.0 0.5 - 1.9 mmol/L    Comment: Performed at [facility], [address], [place], [place] [address]    CT ABDOMEN PELVIS W CONTRAST Result Date: 10/16/2023 CLINICAL DATA:  R inguinal pain, right upper thigh redness EXAM: CT ABDOMEN AND PELVIS WITH CONTRAST TECHNIQUE: Multidetector CT imaging of the abdomen and pelvis was performed using the standard protocol following bolus administration of intravenous contrast. RADIATION DOSE REDUCTION: This exam was performed according to the departmental dose-optimization program which includes automated exposure control,  adjustment of the mA and/or kV according to patient size and/or use of iterative reconstruction technique. CONTRAST:  OMNIPAQUE  IOHEXOL  300 MG/ML  SOLN COMPARISON:  April 22, 2023 FINDINGS: Lower chest: Small left pleural effusion with left basilar atelectasis.No cardiomegaly. Hepatobiliary: No mass.No radiopaque stones or wall thickening of the gallbladder. No intrahepatic or extrahepatic biliary ductal dilation. The portal veins are patent. Pancreas: No mass or main ductal dilation. No peripancreatic inflammation or fluid collection. Spleen: Normal size. No mass. Adrenals/Urinary Tract: No adrenal masses. Multifocal cortical scarring in both kidneys. No mass. No nephrolithiasis or hydronephrosis. Partially distended urinary bladder without visualized abnormality. Stomach/Bowel: The stomach contains ingested material without focal abnormality. no small bowel wall thickening or inflammation. No small bowel obstruction.The appendix is not visualized, likely surgically absent. Along the posterior aspect of the cecum, there is a fluid-filled structure with mucosal hyperenhancement, measuring 2.2 cm (axial 46), with adjacent stranding. Total colonic diverticulosis is also noted scattered throughout the colon. Vascular/Lymphatic: No aortic aneurysm. Diffuse aortoiliac atherosclerosis. No intraabdominal or pelvic lymphadenopathy. Reproductive: Hysterectomy. No concerning adnexal mass.No free pelvic fluid. Other: No pneumoperitoneum or ascites Musculoskeletal: No acute fracture or destructive lesion. Partially visualized breast implants bilaterally. Multilevel degenerative disc disease of the spine. Skin thickening and soft tissue irregularity in the right inguinal region, overall measuring 3.9 x 3.3 x 5 (APxTRxCC). No well-formed abscess or drainable fluid collection. IMPRESSION: 1. Skin thickening and soft tissue irregularity in the right inguinal region, overall measuring 3.9 x 3.3 x 5 (APxTRxCC). This could  represent combination of postsurgical change with cellulitis. No well-formed abscess or drainable fluid collection. 2. Along the posterior aspect of the cecum, there is a fluid-filled structure with mucosal hyperenhancement, measuring 2.2 cm (axial 46), with adjacent stranding. While the adjacent stranding could be chronic changes related to the remote retroperitoneal hematoma, this is more worrisome for an acute process, such as acute diverticulitis. Alternatively, in the setting of a prior appendectomy, fluid trapped within the appendiceal stump, due to stump appendicitis could also have this appearance. Nonemergent colonoscopy recommended to exclude underlying neoplasm. 3. Small left pleural effusion. Electronically Signed   By: Zeb Heys  Molly Angers M.D.   On: 10/16/2023 17:58    ROS 10 point review of systems is negative except as listed above in HPI.   Physical Exam Blood pressure (!) 157/92, pulse 69, temperature 98 F (36.7 C), temperature source Oral, resp. rate 18, height 5\' 3"  (1.6 m), weight 54.4 kg, SpO2 100%. Constitutional: well-developed, well-nourished HEENT: pupils equal, round, reactive to light, 2mm b/l, moist conjunctiva, external inspection of ears and nose normal, hearing intact Oropharynx: normal oropharyngeal mucosa, normal dentition Neck: no thyromegaly, trachea midline, no midline cervical tenderness to palpation Chest: breath sounds equal bilaterally, normal respiratory effort, no midline or lateral chest wall tenderness to palpation/deformity Abdomen: soft, NT, no bruising, no hepatosplenomegaly GU: normal female genitalia, right inguinal hernia-soft, nontender, reducible; right groin wound nearly completely healed, medial right thigh erythema Skin: warm, dry, no rashes Psych: normal memory, normal mood/affect     Assessment/Plan: Cellulitis - being managed by the primary service, started on abx by primary team (cefepime , flagyl , vanc) Abnormal CT findings - unlikely to  be acute appendicitis/diverticulitis without abdominal pain, nausea, vomiting, fever, or leukocytosis. Unlikely, but possible that this could be early stage of either of these processes. No appendicolith visualized, so would be in favor of abx course per primary for management of cellulitis which current regimen would cover for an intra-abdominal source. Duration per primary team also. Would recommend f/u with GI for colonoscopy, patient would prefer to be seen by a GI in [place]. I have entered followup for Dr. Olivia Bevel in [place]. Right inguinal hernia - soft, nontender, reducible. Patient already has a Careers adviser managing this, recommend f/u with primary surgeon.  FEN - regular diet okay from my standpoint DVT - SCDs, DOAC per primary Dispo - per primary. No acute surgical needs.     Dawn Bamberg, MD General and Trauma Surgery [facility]

## 2023-10-16 NOTE — Telephone Encounter (Signed)
 FYI please see triage note below

## 2023-10-16 NOTE — ED Triage Notes (Addendum)
 In for eval of pain and redness to right upper thigh onset Sunday. Send by PCP/urgent care to r/o cellulitis. Has existing would to right anterior hip that is healing surgery.  Started antibiotic Dicloxacillin 500 mg po QID on Sunday.

## 2023-10-16 NOTE — Progress Notes (Signed)
 ED Pharmacy Antibiotic Sign Off An antibiotic consult was received from an ED provider for cefepime  and vancomycin  per pharmacy dosing for cellulitis. A chart review was completed to assess appropriateness.  The following one time order(s) were placed per pharmacy consult:  cefepime  2000 mg x 1 dose vancomycin  1000 mg x 1 dose  Further antibiotic and/or antibiotic pharmacy consults should be ordered by the admitting provider if indicated.   Thank you for allowing pharmacy to be a part of this patient's care.   Dionicio Fray, PharmD, BCPS 10/16/2023 6:35 PM ED Clinical Pharmacist -  [phone]

## 2023-10-16 NOTE — ED Provider Notes (Signed)
 Received at shift change to discussed with hospitalist for admission. Physical Exam  BP (!) 157/92   Pulse 69   Temp 98 F (36.7 C) (Oral)   Resp 18   Ht 5\' 3"  (1.6 m)   Wt 54.4 kg   SpO2 100%   BMI 21.26 kg/m     Procedures  Procedures  ED Course / MDM   Clinical Course as of 10/16/23 1952  Tue Oct 16, 2023  1610 Consulted general surgery Dr. Aniceto Barley who agreed with admission to either facility.  Will consult once admitted. [CR]  1940 Abs Immature Granulocytes: 0.03 [AA]    Clinical Course User Index [AA] Lucina Sabal, PA-C [CR] Avon Butter, [place]   Medical Decision Making Amount and/or Complexity of Data Reviewed Labs: ordered. Decision-making details documented in ED Course. Radiology: ordered.  Risk Prescription drug management. Decision regarding hospitalization.   Discussed with hospitalist will accept patient for admission.       Lucina Sabal, PA-C 10/16/23 1953    Lowery Rue, DO 10/16/23 [id number]

## 2023-10-16 NOTE — Plan of Care (Addendum)
 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  Patient is stated to ED physician  the redness is extended beyond the markings that her granddaughter placed on the right leg. Patient does report history of having a left heart cath performed on 04/13/2023; area subsequently hemorrhaged and developed retroperitoneal hematoma and then subsequently got infected and patient became septic. This occurred at the end of last year. Patient has had chronic wound since then which has been healing well. States that the area of wound that remains is only about the size of a nickel. Denies any fevers, chills, night sweats.   At presentation to ED patient found hypertensive otherwise hemodynamically stable. BMP unremarkable creatinine and GFR at baseline. CBC normal WBC and platelet count.  Stable H&H. Normal lactic acid level.  CT abdomen pelvis showed: 1. Skin thickening and soft tissue irregularity in the right inguinal region, overall measuring 3.9 x 3.3 x 5 (APxTRxCC). This could represent combination of postsurgical change with cellulitis. No well-formed abscess or drainable fluid collection. 2. Along the posterior aspect of the cecum, there is a fluid-filled structure with mucosal hyperenhancement, measuring 2.2 cm (axial 46), with adjacent stranding. While the adjacent  stranding could be chronic changes related to the remote retroperitoneal hematoma, this is more worrisome for an acute process, such as acute diverticulitis. Alternatively, in the setting of a prior appendectomy, fluid trapped within the appendiceal stump, due to stump appendicitis could also have this appearance. Nonemergent colonoscopy recommended to exclude underlying neoplasm. 3. Small left pleural effusion.    As CT abdomen pelvis showing concern for cellulitis  In the ED patient has been treated with cefepime , vancomycin  and metronidazole ., 500 mL of NS bolus.  As there is nonspecific finding regarding the posterior aspect of the cecum General Surgery Dr. Aniceto Barley has been consulted.  Per ED provider General Surgery stated admit , continue IV antibiotic and general surgery will see patient in consult once patient will arrive.  Unable to clarify with ED provider report to specifically general surgery stated about CT abdomen pelvis finding.    Hospitalist has been consulted management of right groin cellulitis and nonspecific CT abdomen finding. Please reach out to on-call general surgery upon arrival to hospital.

## 2023-10-16 NOTE — Telephone Encounter (Signed)
  Chief Complaint: Redness on right groin/leg/incision site Symptoms: redness, pain Frequency: since Sunday Pertinent Negatives: Patient denies fever, chills Disposition: []ED /[x]Urgent Care (no appt availability in office) / []Appointment(In office/virtual)/ [] [facility]/ []Home Care/ []Refused Recommended Disposition /[][facility]/ [] Follow-up with PCP Additional Notes: Patient called in stating she had a stent placed through her right groin in November, and ending up hemorrhaging and having a major incision. Patient reports on Sunday she noticed she has a large area of redness around incision site and pain. Patient denies fever but states her BP is running slightly low, and HR is running slightly high. Patient advised going to UC immediately. Patient\'s daughter is also uploading picture onto mychart of area.   Copied from CRM #[id number]. Topic: Clinical - Red Word Triage >> Oct 16, 2023  9:32 AM Everette C wrote: Red Word that prompted transfer to Nurse Triage: The patient had a procedure in 04/13/23. The patient has noticed discoloration and irritation near their incision site in the right side of their groin. The irritation first began 10/14/23 Reason for Disposition  [1] Small red area or streak AND [2] no fever  Answer Assessment - Initial Assessment Questions 1. SYMPTOM: "What\'s the main symptom you\'re concerned about?" (e.g., drainage, incision opening up, pain, redness)     Redness spreading 2. ONSET: "When did reaction  start?"     Sunday 3. SURGERY: "What surgery did you have?"     Patient had stent put in through groin entry site 4. DATE of SURGERY: "When was the surgery?"      11 /22/24 5. INCISION SITE: "Where is the incision located?"      Right groin 6. REDNESS: "Is there any redness at the incision site?" If Yes, ask: "How wide across is the redness?" (Inches, centimeters)      Yes 7. PAIN: "Is there any pain?" If Yes, ask: "How bad is it?"  (Scale  1-10; or mild, moderate, severe)   - NONE (0): no pain   - MILD (1-3): doesn't interfere with normal activities    - MODERATE (4-7): interferes with normal activities or awakens from sleep    - SEVERE (8-10): excruciating pain, unable to do any normal activities     Cannot determine 8. BLEEDING: "Is there any bleeding?" If Yes, ask: "How much?" and "Where?"     No 9. DRAINAGE: "Is there any drainage from the incision site?" If Yes, ask: "What color and how much?" (e.g., red, cloudy, pus; drops, teaspoon)     No 10. FEVER: "Do you have a fever?" If Yes, ask: "What is your temperature, how was it measured, and when did it start?"       No 11. OTHER SYMPTOMS: "Do you have any other symptoms?" (e.g., dizziness, rash elsewhere on body, shaking chills, weakness)       Redness  Protocols used: Post-Op Incision Symptoms and Questions-A-AH

## 2023-10-16 NOTE — H&P (Incomplete)
 History and Physical    Patient: Dawn Bruce ZOX:[id number] DOB: 02/02/1946 DOA: 10/16/2023 DOS: the patient was seen and examined on 10/16/2023 PCP: Dawn Speed, MD  Patient coming from: {Point_of_Origin:[address]}  Chief Complaint:  Chief Complaint  Patient presents with   Wound Check   HPI: Dawn Bruce is a 78 y.o. female with medical history significant of diastolic heart failure, CAD ss/p DES, atrial fibrillation on Eliquis , essential hypertension, hyperlipidemia, CKD stage III, chronic pain syndrome, multiple skin cancer, pleural effusion status post thoracentesis in 04/2023,  hospital admission for sepsis due to right groin wound infection secondary to surgical repair of the pseudoaneurysm status post debridement x 2 and wound VAC  follows vascular surgery, anemia of chronic disease, peritoneal hematoma, chronic thrombocytopenia and hyperlipidemia presented to emergency department complaining of increased redness of the right thigh.    In the emergency department patient was noted to be afebrile with blood pressures initially elevated up to 202/97, but all other vital signs maintained.  Labs noted WBC 8.9, hemoglobin 12, platelets 149, BUN 22, creatinine 1.25 (appears improved from prior), and lactic acid 1.    CT abdomen pelvis showed: 1. Skin thickening and soft tissue irregularity in the right inguinal region, overall measuring 3.9 x 3.3 x 5 (APxTRxCC). This could represent combination of postsurgical change with cellulitis. No well-formed abscess or drainable fluid collection. 2. Along the posterior aspect of the cecum, there is a fluid-filled structure with mucosal hyperenhancement, measuring 2.2 cm (axial 46), with adjacent stranding. While the adjacent stranding could be chronic changes related to the remote retroperitoneal hematoma, this is more worrisome for an acute process, such as acute diverticulitis. Alternatively, in the setting of a prior appendectomy, fluid  trapped within the appendiceal stump, due to stump appendicitis could also have this appearance. Nonemergent colonoscopy recommended to exclude underlying neoplasm. 3. Small left pleural effusion.    In the ED patient has been treated with cefepime , vancomycin  and metronidazole ., 500 mL of NS bolus.   As there is nonspecific finding regarding the posterior aspect of the cecum General Surgery Dr. Aniceto Barley has been consulted.   Review of Systems: As mentioned in the history of present illness. All other systems reviewed and are negative. Past Medical History:  Diagnosis Date   Actinic keratosis    Chronic pain    GERD (gastroesophageal reflux disease) ?   Taking Pepcid    History of basal cell carcinoma (BCC) 05/02/2021   right upper back paraspinal   History of SCC (squamous cell carcinoma) of skin 09/10/2019   left dorsum wrist ED&C done 10/28/19   History of SCC (squamous cell carcinoma) of skin 03/04/2020   right dorsum hand   History of squamous cell carcinoma in situ (SCCIS) 03/04/2020   left dorsum wrist  ED&C   History of squamous cell carcinoma in situ (SCCIS) 03/04/2020   right medial infraorbital  ED&C 04/28/2020   History of squamous cell carcinoma in situ (SCCIS) 10/28/2019   right dorsum hand proximal lateral   History of squamous cell carcinoma in situ (SCCIS) 10/28/2019   right dorsum hand proximal medial   Hyperlipidemia    Hypertension    NSTEMI (non-ST elevated myocardial infarction) (HCC) 04/12/2023   Renal disorder    Squamous cell carcinoma in situ 10/31/2021   left distal  tricep, EDC   Squamous cell carcinoma of skin 08/28/2022   Right volar forearm - EDC   Past Surgical History:  Procedure Laterality Date   ABDOMINAL HYSTERECTOMY  APPENDECTOMY  April 2023   Burst appendix   APPLICATION OF WOUND VAC Right 04/25/2023   Procedure: APPLICATION OF WOUND VAC;  Surgeon: Jackquelyn Mass, MD;  Location: [facility];  Service: Vascular;  Laterality: Right;    APPLICATION OF WOUND VAC Right 04/27/2023   Procedure: WOUND VAC EXCHANGE;  Surgeon: Jackquelyn Mass, MD;  Location: [facility];  Service: Vascular;  Laterality: Right;   APPLICATION OF WOUND VAC Right 05/01/2023   Procedure: APPLICATION OF WOUND VAC;  Surgeon: Jackquelyn Mass, MD;  Location: [facility];  Service: Vascular;  Laterality: Right;   AUGMENTATION MAMMAPLASTY Bilateral    25-30 years ago   CARDIOVERSION N/A 12/01/2020   Procedure: CARDIOVERSION;  Surgeon: Constancia Delton, MD;  Location: [facility];  Service: Cardiovascular;  Laterality: N/A;   CAROTID ARTERY ANGIOPLASTY Right 1995(approximate)   CAROTID ENDARTERECTOMY  2001   CORONARY STENT INTERVENTION N/A 04/13/2023   Procedure: CORONARY STENT INTERVENTION;  Surgeon: Percival Brace, MD;  Location: [facility];  Service: Cardiovascular;  Laterality: N/A;   COSMETIC SURGERY     ELBOW SURGERY Left    ENDARTERECTOMY FEMORAL Right 04/13/2023   Procedure: Right groin exploration with repair of right external iliac artery and right circumflex artery;  Surgeon: Lesta Rater, MD;  Location: [facility];  Service: Vascular;  Laterality: Right;   FRACTURE SURGERY  2016   Arm   INCISION AND DRAINAGE OF WOUND Right 04/25/2023   Procedure: IRRIGATION AND DEBRIDEMENT WOUND;  Surgeon: Jackquelyn Mass, MD;  Location: [facility];  Service: Vascular;  Laterality: Right;   INCISION AND DRAINAGE OF WOUND Right 04/27/2023   Procedure: IRRIGATION AND DEBRIDEMENT WOUND;  Surgeon: Jackquelyn Mass, MD;  Location: [facility];  Service: Vascular;  Laterality: Right;   INCISION AND DRAINAGE OF WOUND Right 05/01/2023   Procedure: IRRIGATION AND DEBRIDEMENT WOUND;  Surgeon: Jackquelyn Mass, MD;  Location: [facility];  Service: Vascular;  Laterality: Right;   LEFT HEART CATH AND CORONARY ANGIOGRAPHY N/A 04/13/2023   Procedure: LEFT HEART CATH AND CORONARY ANGIOGRAPHY;  Surgeon: Percival Brace, MD;  Location: [facility];   Service: Cardiovascular;  Laterality: N/A;   THORACENTESIS N/A 12/27/2020   Procedure: Antoine Kirsch;  Surgeon: Prudy Brownie, DO;  Location: [facility];  Service: Pulmonary;  Laterality: N/A;   TONSILLECTOMY     TUBAL LIGATION  ?   Social History:  reports that she quit smoking about 35 years ago. Her smoking use included cigarettes. She started smoking about 50 years ago. She has a 3.8 pack-year smoking history. She has been exposed to tobacco smoke. She has never used smokeless tobacco. She reports current alcohol  use of about 14.0 standard drinks of alcohol  per week. She reports that she does not use drugs.  No Known Allergies  Family History  Problem Relation Age of Onset   Cancer Mother    Cancer Father    Cancer Sister    Breast cancer Neg Hx     Prior to Admission medications   Medication Sig Start Date End Date Taking? Authorizing Provider  dicloxacillin (DYNAPEN) 500 MG capsule Take 500 mg by mouth 4 (four) times daily.   Yes [provider]  apixaban  (ELIQUIS ) 2.5 MG TABS tablet Take 2.5 mg by mouth 2 (two) times daily.    [provider]  atorvastatin  (LIPITOR ) 40 MG tablet Take 1 tablet (40 mg total) by mouth daily. 06/08/23 07/08/23  Dawn Speed, MD  carvedilol  (COREG ) 12.5 MG tablet  Take 1 tablet (12.5 mg total) by mouth 2 (two) times daily with a meal. Patient taking differently: Take 6.25 mg by mouth 2 (two) times daily with a meal. 06/25/23   Bacigalupo, Stan Eans, MD  clopidogrel  (PLAVIX ) 75 MG tablet Take 1 tablet (75 mg total) by mouth daily. 04/02/23   Bacigalupo, Angela M, MD  empagliflozin (JARDIANCE) 10 MG TABS tablet Take 10 mg by mouth daily.    [provider]  ezetimibe  (ZETIA ) 10 MG tablet TAKE 1 TABLET(10 MG) BY MOUTH DAILY 04/05/23   Bacigalupo, Stan Eans, MD  fentaNYL  (DURAGESIC ) 75 MCG/HR Place 1 patch onto the skin every 3 (three) days. 09/27/23   Bacigalupo, Angela M, MD  pregabalin  (LYRICA ) 150 MG capsule Take 1  capsule (150 mg total) by mouth 3 (three) times daily. 09/03/23   Dawn Speed, MD    Physical Exam: Vitals:   10/16/23 2100 10/16/23 2130 10/16/23 2221 10/16/23 2227  BP: (!) 151/54 (!) 147/58 (!) 202/97 (!) 151/121  Pulse: 76 76  76  Resp:    18  Temp:    (!) 97.4 F (36.3 C)  TempSrc:      SpO2: 97% 97%  100%  Weight:      Height:       *** Data Reviewed: {Tip this will not be part of the note when signed- Document your independent interpretation of telemetry tracing, EKG, lab, Radiology test or any other diagnostic tests. Add any new diagnostic test ordered today. (Optional):26781} reviewed labs, imaging, and pertinent records as documented.  Assessment and Plan:  Cellulitis of the right thigh  -Admit to a medical telemetry bed   Hypertensive urgency On admission blood pressures noted to be elevated up to 202/97. - Continue home blood pressure regimen - Hydralazine  IV as needed  Paroxysmal atrial fibrillation   - Continue Eliquis     Advance Care Planning:   Code Status: Prior ***  Consults: ***  Family Communication: ***  Severity of Illness: {Observation/Inpatient:21159}  Author: Lena Qualia, MD 10/16/2023 10:54 PM  For on call review [email or web address].

## 2023-10-16 NOTE — Telephone Encounter (Signed)
 Noted

## 2023-10-16 NOTE — ED Provider Notes (Signed)
 [place] EMERGENCY DEPARTMENT AT [facility] Provider Note   CSN: [id number] Arrival date & time: 10/16/23  1148     History {Add pertinent medical, surgical, social history, OB history to HPI:1} Chief Complaint  Patient presents with   Wound Check    Dawn Bruce is a 78 y.o. female.   Wound Check   78 year old female presents emergency department with complaints of redness on right thigh.  States she has noticed increased redness since Sunday.  Was given dicloxacillin at that time without improvement.  States that the redness is extended beyond the markings that her granddaughter placed on the right leg.  Patient does report history of having a left heart cath performed on 04/13/2023; area subsequently hemorrhaged and developed retroperitoneal hematoma and then subsequently got infected and patient became septic.  This occurred at the end of last year.  Patient has had chronic wound since then which has been healing well.  States that the area of wound that remains is only about the size of a nickel.  Denies any fevers, chills, night sweats.  Home Medications Prior to Admission medications   Medication Sig Start Date End Date Taking? Authorizing Provider  dicloxacillin (DYNAPEN) 500 MG capsule Take 500 mg by mouth 4 (four) times daily.   Yes [provider]  apixaban  (ELIQUIS ) 2.5 MG TABS tablet Take 2.5 mg by mouth 2 (two) times daily.    [provider]  atorvastatin  (LIPITOR ) 40 MG tablet Take 1 tablet (40 mg total) by mouth daily. 06/08/23 07/08/23  Bacigalupo, Angela M, MD  carvedilol  (COREG ) 12.5 MG tablet Take 1 tablet (12.5 mg total) by mouth 2 (two) times daily with a meal. Patient taking differently: Take 6.25 mg by mouth 2 (two) times daily with a meal. 06/25/23   Bacigalupo, Stan Eans, MD  clopidogrel  (PLAVIX ) 75 MG tablet Take 1 tablet (75 mg total) by mouth daily. 04/02/23   Bacigalupo, Angela M, MD  empagliflozin (JARDIANCE) 10 MG TABS tablet  Take 10 mg by mouth daily.    [provider]  ezetimibe  (ZETIA ) 10 MG tablet TAKE 1 TABLET(10 MG) BY MOUTH DAILY 04/05/23   Bacigalupo, Stan Eans, MD  fentaNYL  (DURAGESIC ) 75 MCG/HR Place 1 patch onto the skin every 3 (three) days. 09/27/23   Bacigalupo, Angela M, MD  pregabalin  (LYRICA ) 150 MG capsule Take 1 capsule (150 mg total) by mouth 3 (three) times daily. 09/03/23   Bacigalupo, Angela M, MD      Allergies    Patient has no known allergies.    Review of Systems   Review of Systems  Physical Exam Updated Vital Signs BP (!) 156/63   Pulse 63   Temp 97.9 F (36.6 C)   Resp 16   Ht 5\' 3"  (1.6 m)   Wt 54.4 kg   SpO2 100%   BMI 21.26 kg/m  Physical Exam     ED Results / Procedures / Treatments   Labs (all labs ordered are listed, but only abnormal results are displayed) Labs Reviewed  BASIC METABOLIC PANEL WITH GFR  CBC WITH DIFFERENTIAL/PLATELET  LACTIC ACID, PLASMA  LACTIC ACID, PLASMA    EKG None  Radiology No results found.  Procedures Procedures  {Document cardiac monitor, telemetry assessment procedure when appropriate:1}  Medications Ordered in ED Medications - No data to display  ED Course/ Medical Decision Making/ A&P   {   Click here for ABCD2, HEART and other calculatorsREFRESH Note before signing :1}  Medical Decision Making Amount and/or Complexity of Data Reviewed Labs: ordered.   ***  {Document critical care time when appropriate:1} {Document review of labs and clinical decision tools ie heart score, Chads2Vasc2 etc:1}  {Document your independent review of radiology images, and any outside records:1} {Document your discussion with family members, caretakers, and with consultants:1} {Document social determinants of health affecting pt's care:1} {Document your decision making why or why not admission, treatments were needed:1} Final Clinical Impression(s) / ED Diagnoses Final diagnoses:  None     Rx / DC Orders ED Discharge Orders     None

## 2023-10-17 DIAGNOSIS — R935 Abnormal findings on diagnostic imaging of other abdominal regions, including retroperitoneum: Secondary | ICD-10-CM | POA: Diagnosis present

## 2023-10-17 DIAGNOSIS — I480 Paroxysmal atrial fibrillation: Secondary | ICD-10-CM | POA: Diagnosis present

## 2023-10-17 DIAGNOSIS — I160 Hypertensive urgency: Secondary | ICD-10-CM | POA: Diagnosis present

## 2023-10-17 DIAGNOSIS — L03314 Cellulitis of groin: Secondary | ICD-10-CM | POA: Diagnosis not present

## 2023-10-17 LAB — CBC
HCT: 36.9 % (ref 36.0–46.0)
Hemoglobin: 11.9 g/dL — ABNORMAL LOW (ref 12.0–15.0)
MCH: 25.2 pg — ABNORMAL LOW (ref 26.0–34.0)
MCHC: 32.2 g/dL (ref 30.0–36.0)
MCV: 78 fL — ABNORMAL LOW (ref 80.0–100.0)
Platelets: 138 10*3/uL — ABNORMAL LOW (ref 150–400)
RBC: 4.73 MIL/uL (ref 3.87–5.11)
RDW: 19 % — ABNORMAL HIGH (ref 11.5–15.5)
WBC: 7.4 10*3/uL (ref 4.0–10.5)
nRBC: 0 % (ref 0.0–0.2)

## 2023-10-17 LAB — COMPREHENSIVE METABOLIC PANEL WITH GFR
ALT: 16 U/L (ref 0–44)
AST: 18 U/L (ref 15–41)
Albumin: 2.6 g/dL — ABNORMAL LOW (ref 3.5–5.0)
Alkaline Phosphatase: 63 U/L (ref 38–126)
Anion gap: 8 (ref 5–15)
BUN: 21 mg/dL (ref 8–23)
CO2: 22 mmol/L (ref 22–32)
Calcium: 8.7 mg/dL — ABNORMAL LOW (ref 8.9–10.3)
Chloride: 112 mmol/L — ABNORMAL HIGH (ref 98–111)
Creatinine, Ser: 1.23 mg/dL — ABNORMAL HIGH (ref 0.44–1.00)
GFR, Estimated: 45 mL/min — ABNORMAL LOW (ref >60)
Glucose, Bld: 98 mg/dL (ref 70–99)
Potassium: 3.8 mmol/L (ref 3.5–5.1)
Sodium: 142 mmol/L (ref 135–145)
Total Bilirubin: 0.6 mg/dL (ref 0.0–1.2)
Total Protein: 5.6 g/dL — ABNORMAL LOW (ref 6.5–8.1)

## 2023-10-17 LAB — MAGNESIUM: Magnesium: 2.1 mg/dL (ref 1.7–2.4)

## 2023-10-17 LAB — SEDIMENTATION RATE: Sed Rate: 39 mm/h — ABNORMAL HIGH (ref 0–22)

## 2023-10-17 LAB — C-REACTIVE PROTEIN: CRP: 7.1 mg/dL — ABNORMAL HIGH (ref <1.0)

## 2023-10-17 MED ORDER — ATORVASTATIN CALCIUM 40 MG PO TABS
40.0000 mg | ORAL_TABLET | Freq: Every day | ORAL | Status: DC
  Administered 2023-10-17 – 2023-10-19 (×3): 40 mg via ORAL
  Filled 2023-10-17 (×3): qty 1

## 2023-10-17 MED ORDER — EZETIMIBE 10 MG PO TABS
10.0000 mg | ORAL_TABLET | Freq: Every day | ORAL | Status: DC
  Administered 2023-10-17 – 2023-10-19 (×3): 10 mg via ORAL
  Filled 2023-10-17 (×3): qty 1

## 2023-10-17 MED ORDER — VANCOMYCIN HCL 500 MG/100ML IV SOLN
500.0000 mg | INTRAVENOUS | Status: DC
  Administered 2023-10-17 – 2023-10-18 (×2): 500 mg via INTRAVENOUS
  Filled 2023-10-17 (×2): qty 100

## 2023-10-17 MED ORDER — HYDROCODONE-ACETAMINOPHEN 5-325 MG PO TABS
1.0000 | ORAL_TABLET | Freq: Four times a day (QID) | ORAL | Status: DC | PRN
  Administered 2023-10-17 (×3): 1 via ORAL
  Filled 2023-10-17 (×3): qty 1

## 2023-10-17 MED ORDER — SODIUM CHLORIDE 0.9 % IV SOLN
2.0000 g | Freq: Two times a day (BID) | INTRAVENOUS | Status: DC
  Administered 2023-10-17 – 2023-10-19 (×5): 2 g via INTRAVENOUS
  Filled 2023-10-17 (×5): qty 12.5

## 2023-10-17 MED ORDER — CLOPIDOGREL BISULFATE 75 MG PO TABS
75.0000 mg | ORAL_TABLET | Freq: Every day | ORAL | Status: DC
  Administered 2023-10-17 – 2023-10-19 (×3): 75 mg via ORAL
  Filled 2023-10-17 (×3): qty 1

## 2023-10-17 MED ORDER — EMPAGLIFLOZIN 10 MG PO TABS
10.0000 mg | ORAL_TABLET | Freq: Every day | ORAL | Status: DC
  Administered 2023-10-17 – 2023-10-19 (×3): 10 mg via ORAL
  Filled 2023-10-17 (×3): qty 1

## 2023-10-17 MED ORDER — FENTANYL 75 MCG/HR TD PT72
1.0000 | MEDICATED_PATCH | TRANSDERMAL | Status: DC
  Administered 2023-10-19: 1 via TRANSDERMAL
  Filled 2023-10-17: qty 1

## 2023-10-17 NOTE — Progress Notes (Signed)
 PROGRESS NOTE    Dawn Bruce  YNW:[id number] DOB: [birth year] DOA: 10/16/2023 PCP: Mazie Speed, MD   Brief Narrative:  78 y.o. female with medical history significant of diastolic heart failure, CAD s/p DES in 03/2023, atrial fibrillation on Eliquis , essential hypertension, hyperlipidemia, CKD stage III, chronic pain syndrome, multiple skin cancer, pleural effusion s/p thoracentesis in 04/2023,  hospital admission for sepsis due to right groin wound infection secondary to surgical repair of the pseudoaneurysm status post debridement x 2 and wound VAC for which she follows with vascular surgery, anemia of chronic disease, chronic thrombocytopenia, and hyperlipidemia presented to emergency department complaining of increased redness of the right thigh despite being on oral antibiotics as an outpatient.  On presentation, she was afebrile with WBC of 8.9.  CT of abdomen and pelvis showed findings suggestive of right inguinal region cellulitis with no abscess or drainable fluid collection along with findings suggestive of possible acute diverticulitis versus acute appendicitis.  She was started on broad-spectrum antibiotics.  General surgery was consulted.  Assessment & Plan:   Right thigh cellulitis -Patient had progressively worsening right thigh redness despite being on dicloxacillin as an outpatient.  Imaging as above.  Continue broad-spectrum antibiotics.  Abnormal CT of abdomen and pelvis -Findings concerning for acute diverticulitis versus acute appendicitis.  General surgery has been consulted: Follow recommendations. -Will need outpatient follow-up with GI  Hypertensive urgency Hyperlipidemia -blood pressure improving but still on the higher side.  Continue Coreg .  Continue hydralazine  IV as needed - Continue statin and Zetia   Paroxysmal A-fib - Currently rate controlled.  Continue Eliquis  and Coreg   CAD with history of stenting Chronic systolic heart failure -Stable.  No  chest pain.  Continue Plavix , statin, beta-blocker - Currently compensated.  Strict input and output.  Daily weights.  Patient is not on any diuretics at home.  Last EF was 25 to 30% in 03/2023 echo.  Outpatient follow-up with cardiology  CKD stage IIIb 1 creatinine currently stable.  Monitor intermittently  Thrombocytopenia Questionable cause.  No signs of bleeding.  Monitor intermittently  Chronic pain with opiate dependence Neuropathy -Continue Lyrica  and fentanyl  patch   DVT prophylaxis: Eliquis  Code Status: Full Family Communication: None at bedside Disposition Plan: Status is: Inpatient Remains inpatient appropriate because: Of severity of illness    Consultants: General Surgery  Procedures: None  Antimicrobials:  Anti-infectives (From admission, onward)    Start     Dose/Rate Route Frequency Ordered Stop   10/17/23 1800  vancomycin  (VANCOREADY) IVPB 500 mg/100 mL        500 mg 100 mL/hr over 60 Minutes Intravenous Every 24 hours 10/17/23 0043     10/17/23 0800  ceFEPIme  (MAXIPIME ) 2 g in sodium chloride  0.9 % 100 mL IVPB        2 g 200 mL/hr over 30 Minutes Intravenous 2 times daily 10/17/23 0045     10/16/23 1845  piperacillin -tazobactam (ZOSYN ) IVPB 3.375 g  Status:  Discontinued        3.375 g 100 mL/hr over 30 Minutes Intravenous  Once 10/16/23 1835 10/16/23 1836   10/16/23 1845  vancomycin  (VANCOCIN ) IVPB 1000 mg/200 mL premix  Status:  Discontinued        1,000 mg 200 mL/hr over 60 Minutes Intravenous  Once 10/16/23 1835 10/16/23 1840   10/16/23 1845  ceFEPIme  (MAXIPIME ) 2 g in sodium chloride  0.9 % 100 mL IVPB        2 g 200 mL/hr over 30 Minutes Intravenous  Once 10/16/23  1840 10/16/23 1956   10/16/23 1845  metroNIDAZOLE  (FLAGYL ) IVPB 500 mg        500 mg 100 mL/hr over 60 Minutes Intravenous  Once 10/16/23 1840 10/16/23 1956   10/16/23 1845  vancomycin  (VANCOCIN ) IVPB 1000 mg/200 mL premix        1,000 mg 200 mL/hr over 60 Minutes Intravenous  Once  10/16/23 1841 10/16/23 2029        Subjective: Patient seen and examined at bedside.  No fever, vomiting, chest pain or shortness of breath reported.  Objective: Vitals:   10/16/23 2302 10/16/23 2308 10/17/23 0326 10/17/23 0745  BP:  (!) 169/68 (!) 189/76 (!) 153/63  Pulse: 82 77 76 73  Resp:  20 20 16   Temp:  98 F (36.7 C) 97.8 F (36.6 C) 97.8 F (36.6 C)  TempSrc:  Oral Oral Oral  SpO2: 99% 100% 99% 98%  Weight:      Height:        Intake/Output Summary (Last 24 hours) at 10/17/2023 1049 Last data filed at 10/16/2023 1956 Gross per 24 hour  Intake 701.25 ml  Output --  Net 701.25 ml   Filed Weights   10/16/23 1210  Weight: 54.4 kg    Examination:  General exam: Appears calm and comfortable.  Elderly female lying in bed. Respiratory system: Bilateral decreased breath sounds at bases, no wheezing Cardiovascular system: S1 & S2 heard, Rate controlled Gastrointestinal system: Abdomen is nondistended, soft and nontender. Normal bowel sounds heard. Extremities: No cyanosis, clubbing, edema  Central nervous system: Alert and oriented. No focal neurological deficits. Moving extremities Skin: Inner right thigh erythema with no fluctuance or drainage Psychiatry: Judgement and insight appear normal. Mood & affect appropriate.     Data Reviewed: I have personally reviewed following labs and imaging studies  CBC: Recent Labs  Lab 10/16/23 1333 10/17/23 0308  WBC 8.9 7.4  NEUTROABS 7.2  --   HGB 12.0 11.9*  HCT 39.3 36.9  MCV 80.5 78.0*  PLT 149* 138*   Basic Metabolic Panel: Recent Labs  Lab 10/16/23 1333 10/17/23 0308  NA 138 142  K 4.7 3.8  CL 103 112*  CO2 22 22  GLUCOSE 93 98  BUN 22 21  CREATININE 1.25* 1.23*  CALCIUM  9.5 8.7*  MG  --  2.1   GFR: Estimated Creatinine Clearance: 30.7 mL/min (A) (by C-G formula based on SCr of 1.23 mg/dL (H)). Liver Function Tests: Recent Labs  Lab 10/17/23 0308  AST 18  ALT 16  ALKPHOS 63  BILITOT 0.6   PROT 5.6*  ALBUMIN  2.6*   No results for input(s): "LIPASE", "AMYLASE" in the last 168 hours. No results for input(s): "AMMONIA" in the last 168 hours. Coagulation Profile: No results for input(s): "INR", "PROTIME" in the last 168 hours. Cardiac Enzymes: No results for input(s): "CKTOTAL", "CKMB", "CKMBINDEX", "TROPONINI" in the last 168 hours. BNP (last 3 results) No results for input(s): "PROBNP" in the last 8760 hours. HbA1C: No results for input(s): "HGBA1C" in the last 72 hours. CBG: No results for input(s): "GLUCAP" in the last 168 hours. Lipid Profile: No results for input(s): "CHOL", "HDL", "LDLCALC", "TRIG", "CHOLHDL", "LDLDIRECT" in the last 72 hours. Thyroid Function Tests: No results for input(s): "TSH", "T4TOTAL", "FREET4", "T3FREE", "THYROIDAB" in the last 72 hours. Anemia Panel: No results for input(s): "VITAMINB12", "FOLATE", "FERRITIN", "TIBC", "IRON", "RETICCTPCT" in the last 72 hours. Sepsis Labs: Recent Labs  Lab 10/16/23 1333  LATICACIDVEN 1.0    No results found for  this or any previous visit (from the past 240 hours).       Radiology Studies: CT ABDOMEN PELVIS W CONTRAST Result Date: 10/16/2023 CLINICAL DATA:  R inguinal pain, right upper thigh redness EXAM: CT ABDOMEN AND PELVIS WITH CONTRAST TECHNIQUE: Multidetector CT imaging of the abdomen and pelvis was performed using the standard protocol following bolus administration of intravenous contrast. RADIATION DOSE REDUCTION: This exam was performed according to the departmental dose-optimization program which includes automated exposure control, adjustment of the mA and/or kV according to patient size and/or use of iterative reconstruction technique. CONTRAST:  OMNIPAQUE  IOHEXOL  300 MG/ML  SOLN COMPARISON:  April 22, 2023 FINDINGS: Lower chest: Small left pleural effusion with left basilar atelectasis.No cardiomegaly. Hepatobiliary: No mass.No radiopaque stones or wall thickening of the  gallbladder. No intrahepatic or extrahepatic biliary ductal dilation. The portal veins are patent. Pancreas: No mass or main ductal dilation. No peripancreatic inflammation or fluid collection. Spleen: Normal size. No mass. Adrenals/Urinary Tract: No adrenal masses. Multifocal cortical scarring in both kidneys. No mass. No nephrolithiasis or hydronephrosis. Partially distended urinary bladder without visualized abnormality. Stomach/Bowel: The stomach contains ingested material without focal abnormality. no small bowel wall thickening or inflammation. No small bowel obstruction.The appendix is not visualized, likely surgically absent. Along the posterior aspect of the cecum, there is a fluid-filled structure with mucosal hyperenhancement, measuring 2.2 cm (axial 46), with adjacent stranding. Total colonic diverticulosis is also noted scattered throughout the colon. Vascular/Lymphatic: No aortic aneurysm. Diffuse aortoiliac atherosclerosis. No intraabdominal or pelvic lymphadenopathy. Reproductive: Hysterectomy. No concerning adnexal mass.No free pelvic fluid. Other: No pneumoperitoneum or ascites Musculoskeletal: No acute fracture or destructive lesion. Partially visualized breast implants bilaterally. Multilevel degenerative disc disease of the spine. Skin thickening and soft tissue irregularity in the right inguinal region, overall measuring 3.9 x 3.3 x 5 (APxTRxCC). No well-formed abscess or drainable fluid collection. IMPRESSION: 1. Skin thickening and soft tissue irregularity in the right inguinal region, overall measuring 3.9 x 3.3 x 5 (APxTRxCC). This could represent combination of postsurgical change with cellulitis. No well-formed abscess or drainable fluid collection. 2. Along the posterior aspect of the cecum, there is a fluid-filled structure with mucosal hyperenhancement, measuring 2.2 cm (axial 46), with adjacent stranding. While the adjacent stranding could be chronic changes related to the remote  retroperitoneal hematoma, this is more worrisome for an acute process, such as acute diverticulitis. Alternatively, in the setting of a prior appendectomy, fluid trapped within the appendiceal stump, due to stump appendicitis could also have this appearance. Nonemergent colonoscopy recommended to exclude underlying neoplasm. 3. Small left pleural effusion. Electronically Signed   By: Rance Burrows M.D.   On: 10/16/2023 17:58        Scheduled Meds:  apixaban   2.5 mg Oral BID   atorvastatin   40 mg Oral Daily   carvedilol   6.25 mg Oral BID WC   clopidogrel   75 mg Oral Daily   empagliflozin  10 mg Oral Daily   ezetimibe   10 mg Oral Daily   [START ON 10/19/2023] fentaNYL   1 patch Transdermal Q72H   pregabalin   150 mg Oral TID   sodium chloride  flush  3 mL Intravenous Q12H   Continuous Infusions:  ceFEPime  (MAXIPIME ) IV 2 g (10/17/23 0857)   vancomycin             Audria Leather, MD [facility] 10/17/2023, 10:49 AM

## 2023-10-17 NOTE — Progress Notes (Addendum)
 Pharmacy Antibiotic Note  Dawn Bruce is a 78 y.o. female admitted on 10/16/2023 with RLE cellulitis.  Pharmacy has been consulted for Vancomycin  and Cefepime  dosing.  Plan: Vancomycin  500 mg IV q24h Cefepime  2 g IV q12h   Height: 5\' 3"  (160 cm) Weight: 54.4 kg (120 lb) IBW/kg (Calculated) : 52.4  Temp (24hrs), Avg:97.9 F (36.6 C), Min:97.4 F (36.3 C), Max:98.1 F (36.7 C)  Recent Labs  Lab 10/16/23 1333  WBC 8.9  CREATININE 1.25*  LATICACIDVEN 1.0    Estimated Creatinine Clearance: 30.2 mL/min (A) (by C-G formula based on SCr of 1.25 mg/dL (H)).    No Known Allergies   Carlota Chestnut 10/17/2023 12:17 AM

## 2023-10-17 NOTE — Plan of Care (Signed)

## 2023-10-18 DIAGNOSIS — L03314 Cellulitis of groin: Secondary | ICD-10-CM | POA: Diagnosis not present

## 2023-10-18 LAB — CBC WITH DIFFERENTIAL/PLATELET
Abs Immature Granulocytes: 0.02 10*3/uL (ref 0.00–0.07)
Basophils Absolute: 0 10*3/uL (ref 0.0–0.1)
Basophils Relative: 0 %
Eosinophils Absolute: 0.4 10*3/uL (ref 0.0–0.5)
Eosinophils Relative: 9 %
HCT: 37 % (ref 36.0–46.0)
Hemoglobin: 11.7 g/dL — ABNORMAL LOW (ref 12.0–15.0)
Immature Granulocytes: 0 %
Lymphocytes Relative: 14 %
Lymphs Abs: 0.7 10*3/uL (ref 0.7–4.0)
MCH: 24.9 pg — ABNORMAL LOW (ref 26.0–34.0)
MCHC: 31.6 g/dL (ref 30.0–36.0)
MCV: 78.7 fL — ABNORMAL LOW (ref 80.0–100.0)
Monocytes Absolute: 0.6 10*3/uL (ref 0.1–1.0)
Monocytes Relative: 12 %
Neutro Abs: 3.2 10*3/uL (ref 1.7–7.7)
Neutrophils Relative %: 65 %
Platelets: 157 10*3/uL (ref 150–400)
RBC: 4.7 MIL/uL (ref 3.87–5.11)
RDW: 19.2 % — ABNORMAL HIGH (ref 11.5–15.5)
WBC: 4.9 10*3/uL (ref 4.0–10.5)
nRBC: 0 % (ref 0.0–0.2)

## 2023-10-18 LAB — C-REACTIVE PROTEIN: CRP: 4.3 mg/dL — ABNORMAL HIGH (ref <1.0)

## 2023-10-18 LAB — MAGNESIUM: Magnesium: 2.2 mg/dL (ref 1.7–2.4)

## 2023-10-18 LAB — BASIC METABOLIC PANEL WITH GFR
Anion gap: 8 (ref 5–15)
BUN: 23 mg/dL (ref 8–23)
CO2: 25 mmol/L (ref 22–32)
Calcium: 9 mg/dL (ref 8.9–10.3)
Chloride: 111 mmol/L (ref 98–111)
Creatinine, Ser: 1.23 mg/dL — ABNORMAL HIGH (ref 0.44–1.00)
GFR, Estimated: 45 mL/min — ABNORMAL LOW (ref >60)
Glucose, Bld: 101 mg/dL — ABNORMAL HIGH (ref 70–99)
Potassium: 3.9 mmol/L (ref 3.5–5.1)
Sodium: 144 mmol/L (ref 135–145)

## 2023-10-18 NOTE — Progress Notes (Signed)
 PROGRESS NOTE    Dawn Bruce  EAV:[id number] DOB: April 18, 1946 DOA: 10/16/2023 PCP: Mazie Speed, MD   Brief Narrative:  78 y.o. 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  On presentation, she was afebrile with WBC of 8.9.  CT of abdomen and pelvis showed findings suggestive of right inguinal region cellulitis with no abscess or drainable fluid collection along with findings suggestive of possible acute diverticulitis versus acute appendicitis.  She was started on broad-spectrum antibiotics.  General surgery was consulted.  Assessment & Plan:   Right thigh cellulitis -Patient had progressively worsening right thigh redness despite being on dicloxacillin as an outpatient.  Imaging as above.  Continue broad-spectrum antibiotics.  Cellulitis improving.  Abnormal CT of abdomen and pelvis -Findings concerning for acute diverticulitis versus acute appendicitis.  General surgery thinks that patient does not have acute appendicitis or diverticulitis because of lack of abdominal pain/nausea/vomiting/fever. -Will need outpatient follow-up with GI for need for colonoscopy.  Hypertensive urgency Hyperlipidemia -blood pressure improving.  Continue Coreg .  Continue hydralazine  IV as needed - Continue statin and Zetia   Paroxysmal A-fib - Currently rate  controlled.  Continue Eliquis  and Coreg   CAD with history of stenting Chronic systolic heart failure -Stable.  No chest pain.  Continue Plavix , statin, beta-blocker - Currently compensated.  Strict input and output.  Daily weights.  Patient is not on any diuretics at home.  Last EF was 25 to 30% in 03/2023 echo.  Outpatient follow-up with cardiology  CKD stage IIIb -creatinine currently stable.  Monitor intermittently  Thrombocytopenia -Resolved  Chronic pain with opiate dependence Neuropathy -Continue Lyrica  and fentanyl  patch  Physical deconditioning - PT eval   DVT prophylaxis: Eliquis  Code Status: Full Family Communication: None at bedside Disposition Plan: Status is: Inpatient Remains inpatient appropriate because: Of severity of illness.  Possible discharge home in 1 to 2 days if clinically improves    Consultants: General Surgery  Procedures: None  Antimicrobials:  Anti-infectives (From admission, onward)    Start     Dose/Rate Route Frequency Ordered Stop   10/17/23 1800  vancomycin  (VANCOREADY) IVPB 500 mg/100 mL        500 mg 100 mL/hr over 60 Minutes Intravenous Every 24 hours 10/17/23 0043     10/17/23 0800  ceFEPIme  (MAXIPIME ) 2 g in sodium chloride  0.9 % 100 mL IVPB        2 g 200 mL/hr over 30 Minutes Intravenous 2 times daily 10/17/23 0045     10/16/23 1845  piperacillin -tazobactam (ZOSYN ) IVPB 3.375 g  Status:  Discontinued        3.375 g 100 mL/hr over 30 Minutes Intravenous  Once 10/16/23 1835 10/16/23 1836   10/16/23 1845  vancomycin  (VANCOCIN ) IVPB 1000 mg/200 mL premix  Status:  Discontinued        1,000 mg 200 mL/hr over 60 Minutes Intravenous  Once 10/16/23 1835 10/16/23 1840   10/16/23 1845  ceFEPIme  (MAXIPIME ) 2 g in sodium chloride  0.9 %  100 mL IVPB        2 g 200 mL/hr over 30 Minutes Intravenous  Once 10/16/23 1840 10/16/23 1956   10/16/23 1845  metroNIDAZOLE  (FLAGYL ) IVPB 500 mg        500 mg 100 mL/hr over 60 Minutes Intravenous   Once 10/16/23 1840 10/16/23 1956   10/16/23 1845  vancomycin  (VANCOCIN ) IVPB 1000 mg/200 mL premix        1,000 mg 200 mL/hr over 60 Minutes Intravenous  Once 10/16/23 1841 10/16/23 2029        Subjective: Patient seen and examined at bedside.  Right thigh redness is improving.  No fever, chest pain, shortness of breath reported. Objective: Vitals:   10/17/23 0745 10/17/23 1746 10/17/23 2035 10/17/23 2332  BP: (!) 153/63 (!) 166/62 (!) 149/60 (!) 140/48  Pulse: 73 65 72 76  Resp: 16 12 19 12   Temp: 97.8 F (36.6 C)  98 F (36.7 C) 97.6 F (36.4 C)  TempSrc: Oral  Oral Oral  SpO2: 98% 100% 97% 96%  Weight:      Height:        Intake/Output Summary (Last 24 hours) at 10/18/2023 0813 Last data filed at 10/17/2023 2356 Gross per 24 hour  Intake 200.39 ml  Output --  Net 200.39 ml   Filed Weights   10/16/23 1210  Weight: 54.4 kg    Examination:  General: On room air.  No distress ENT/neck: No thyromegaly.  JVD is not elevated  respiratory: Decreased breath sounds at bases bilaterally with some crackles; no wheezing  CVS: S1-S2 heard, rate controlled currently Abdominal: Soft, nontender, slightly distended; no organomegaly, bowel sounds are heard Extremities: Trace lower extremity edema; no cyanosis  CNS: Awake and alert.  No focal neurologic deficit.  Moves extremities Lymph: No obvious lymphadenopathy Skin: Right inner thigh erythema improving.  No fluctuance or drainage.   Psych: Mostly flat affect.  Not agitated.   Musculoskeletal: No obvious joint swelling/deformity     Data Reviewed: I have personally reviewed following labs and imaging studies  CBC: Recent Labs  Lab 10/16/23 1333 10/17/23 0308 10/18/23 0356  WBC 8.9 7.4 4.9  NEUTROABS 7.2  --  3.2  HGB 12.0 11.9* 11.7*  HCT 39.3 36.9 37.0  MCV 80.5 78.0* 78.7*  PLT 149* 138* 157   Basic Metabolic Panel: Recent Labs  Lab 10/16/23 1333 10/17/23 0308 10/18/23 0356  NA 138 142 144  K 4.7 3.8  3.9  CL 103 112* 111  CO2 22 22 25   GLUCOSE 93 98 101*  BUN 22 21 23   CREATININE 1.25* 1.23* 1.23*  CALCIUM  9.5 8.7* 9.0  MG  --  2.1 2.2   GFR: Estimated Creatinine Clearance: 30.7 mL/min (A) (by C-G formula based on SCr of 1.23 mg/dL (H)). Liver Function Tests: Recent Labs  Lab 10/17/23 0308  AST 18  ALT 16  ALKPHOS 63  BILITOT 0.6  PROT 5.6*  ALBUMIN  2.6*   No results for input(s): "LIPASE", "AMYLASE" in the last 168 hours. No results for input(s): "AMMONIA" in the last 168 hours. Coagulation Profile: No results for input(s): "INR", "PROTIME" in the last 168 hours. Cardiac Enzymes: No results for input(s): "CKTOTAL", "CKMB", "CKMBINDEX", "TROPONINI" in the last 168 hours. BNP (last 3 results) No results for input(s): "PROBNP" in the last 8760 hours. HbA1C: No results for input(s): "HGBA1C" in the last 72 hours. CBG: No results for input(s): "GLUCAP" in the last 168 hours. Lipid Profile: No results for input(s): "CHOL", "HDL", "  LDLCALC", "TRIG", "CHOLHDL", "LDLDIRECT" in the last 72 hours. Thyroid Function Tests: No results for input(s): "TSH", "T4TOTAL", "FREET4", "T3FREE", "THYROIDAB" in the last 72 hours. Anemia Panel: No results for input(s): "VITAMINB12", "FOLATE", "FERRITIN", "TIBC", "IRON", "RETICCTPCT" in the last 72 hours. Sepsis Labs: Recent Labs  Lab 10/16/23 1333  LATICACIDVEN 1.0    No results found for this or any previous visit (from the past 240 hours).       Radiology Studies: CT ABDOMEN PELVIS W CONTRAST Result Date: 10/16/2023 CLINICAL DATA:  R inguinal pain, right upper thigh redness EXAM: CT ABDOMEN AND PELVIS WITH CONTRAST TECHNIQUE: Multidetector CT imaging of the abdomen and pelvis was performed using the standard protocol following bolus administration of intravenous contrast. RADIATION DOSE REDUCTION: This exam was performed according to the departmental dose-optimization program which includes automated exposure control, adjustment  of the mA and/or kV according to patient size and/or use of iterative reconstruction technique. CONTRAST:  OMNIPAQUE  IOHEXOL  300 MG/ML  SOLN COMPARISON:  April 22, 2023 FINDINGS: Lower chest: Small left pleural effusion with left basilar atelectasis.No cardiomegaly. Hepatobiliary: No mass.No radiopaque stones or wall thickening of the gallbladder. No intrahepatic or extrahepatic biliary ductal dilation. The portal veins are patent. Pancreas: No mass or main ductal dilation. No peripancreatic inflammation or fluid collection. Spleen: Normal size. No mass. Adrenals/Urinary Tract: No adrenal masses. Multifocal cortical scarring in both kidneys. No mass. No nephrolithiasis or hydronephrosis. Partially distended urinary bladder without visualized abnormality. Stomach/Bowel: The stomach contains ingested material without focal abnormality. no small bowel wall thickening or inflammation. No small bowel obstruction.The appendix is not visualized, likely surgically absent. Along the posterior aspect of the cecum, there is a fluid-filled structure with mucosal hyperenhancement, measuring 2.2 cm (axial 46), with adjacent stranding. Total colonic diverticulosis is also noted scattered throughout the colon. Vascular/Lymphatic: No aortic aneurysm. Diffuse aortoiliac atherosclerosis. No intraabdominal or pelvic lymphadenopathy. Reproductive: Hysterectomy. No concerning adnexal mass.No free pelvic fluid. Other: No pneumoperitoneum or ascites Musculoskeletal: No acute fracture or destructive lesion. Partially visualized breast implants bilaterally. Multilevel degenerative disc disease of the spine. Skin thickening and soft tissue irregularity in the right inguinal region, overall measuring 3.9 x 3.3 x 5 (APxTRxCC). No well-formed abscess or drainable fluid collection. IMPRESSION: 1. Skin thickening and soft tissue irregularity in the right inguinal region, overall measuring 3.9 x 3.3 x 5 (APxTRxCC). This could represent  combination of postsurgical change with cellulitis. No well-formed abscess or drainable fluid collection. 2. Along the posterior aspect of the cecum, there is a fluid-filled structure with mucosal hyperenhancement, measuring 2.2 cm (axial 46), with adjacent stranding. While the adjacent stranding could be chronic changes related to the remote retroperitoneal hematoma, this is more worrisome for an acute process, such as acute diverticulitis. Alternatively, in the setting of a prior appendectomy, fluid trapped within the appendiceal stump, due to stump appendicitis could also have this appearance. Nonemergent colonoscopy recommended to exclude underlying neoplasm. 3. Small left pleural effusion. Electronically Signed   By: Rance Burrows M.D.   On: 10/16/2023 17:58        Scheduled Meds:  apixaban   2.5 mg Oral BID   atorvastatin   40 mg Oral Daily   carvedilol   6.25 mg Oral BID WC   clopidogrel   75 mg Oral Daily   empagliflozin  10 mg Oral Daily   ezetimibe   10 mg Oral Daily   [START ON 10/19/2023] fentaNYL   1 patch Transdermal Q72H   pregabalin   150 mg Oral TID   sodium chloride  flush  3 mL Intravenous Q12H   Continuous Infusions:  ceFEPime  (MAXIPIME ) IV 2 g (10/17/23 2141)   vancomycin  500 mg (10/17/23 1748)          Audria Leather, MD [facility] 10/18/2023, 8:13 AM

## 2023-10-18 NOTE — Progress Notes (Signed)
 Mobility Specialist Progress Note:   10/18/23 0906  Mobility  Activity Ambulated with assistance in hallway;Ambulated with assistance in room  Level of Assistance Standby assist, set-up cues, supervision of patient - no hands on  Assistive Device None  Distance Ambulated (ft) 300 ft  Activity Response Tolerated well  Mobility Referral Yes  Mobility visit 1 Mobility  Mobility Specialist Start Time (ACUTE ONLY) [id number]  Mobility Specialist Stop Time (ACUTE ONLY) 0913  Mobility Specialist Time Calculation (min) (ACUTE ONLY) 7 min   Pt received in bed, agreeable to mobility session. Ambulated in hallway with SV and no AD. Tolerated well, asx throughout. HR in 80's. Returned pt to room, sitting in bed with all needs met, husband at bedside.   Merilee Wible Mobility Specialist Please contact via Special educational needs teacher or  Rehab office at [phone]

## 2023-10-18 NOTE — Plan of Care (Signed)
   Problem: Clinical Measurements: Goal: Respiratory complications will improve Outcome: Progressing Goal: Cardiovascular complication will be avoided Outcome: Progressing   Problem: Activity: Goal: Risk for activity intolerance will decrease Outcome: Progressing

## 2023-10-19 ENCOUNTER — Telehealth: Payer: Self-pay

## 2023-10-19 MED ORDER — DOXYCYCLINE HYCLATE 100 MG PO TABS: 100.0000 mg | ORAL_TABLET | Freq: Two times a day (BID) | ORAL | 0 refills | Status: AC

## 2023-10-19 MED ORDER — CEFUROXIME AXETIL 500 MG PO TABS: 500.0000 mg | ORAL_TABLET | Freq: Two times a day (BID) | ORAL | 0 refills | Status: AC

## 2023-10-19 MED ORDER — CARVEDILOL 12.5 MG PO TABS: 6.2500 mg | ORAL_TABLET | Freq: Two times a day (BID) | ORAL | Status: DC

## 2023-10-19 NOTE — Plan of Care (Signed)
?  Problem: Clinical Measurements: ?Goal: Will remain free from infection ?Outcome: Progressing ?Goal: Diagnostic test results will improve ?Outcome: Progressing ?Goal: Respiratory complications will improve ?Outcome: Progressing ?  ?

## 2023-10-19 NOTE — Discharge Summary (Signed)
 Physician Discharge Summary  Dawn Bruce NWG:[id number] DOB: Aug 16, 1945 DOA: 10/16/2023  PCP: Mazie Speed, MD  Admit date: 10/16/2023 Discharge date: 10/19/2023  Admitted From: Home Disposition: Home  Recommendations for Outpatient Follow-up:  Follow up with PCP in 1 week with repeat CBC/BMP Outpatient follow-up with GI Follow up in ED if symptoms worsen or new appear   Home Health: No Equipment/Devices: None  Discharge Condition: Stable CODE STATUS: Full Diet recommendation: Heart healthy  Brief/Interim Summary: 78 y.o. 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  On presentation, she was afebrile with WBC of 8.9.  CT of abdomen and pelvis showed findings suggestive of right inguinal region cellulitis with no abscess or drainable fluid collection along with findings suggestive of possible acute diverticulitis versus acute appendicitis.  She was started on broad-spectrum antibiotics.  General surgery was consulted.  General surgery recommended conservative management and outpatient follow-up with GI.  During the hospitalization, cellulitis is improving.  She is afebrile and hemodynamically stable and wants to go home today.  She will be discharged home today on oral Ceftin and doxycycline.  Discharge Diagnoses:   Right thigh cellulitis -Patient  had progressively worsening right thigh redness despite being on dicloxacillin as an outpatient.  Imaging as above.  Currently on broad-spectrum antibiotics.  Cellulitis is significantly improved. -  She is afebrile and hemodynamically stable and wants to go home today.  She will be discharged home today on oral Ceftin and doxycycline.  Abnormal CT of abdomen and pelvis -Findings concerning for acute diverticulitis versus acute appendicitis.  General surgery thinks that patient does not have acute appendicitis or diverticulitis because of lack of abdominal pain/nausea/vomiting/fever. -Will need outpatient follow-up with GI for need for colonoscopy.   Hypertensive urgency Hyperlipidemia -blood pressure improved.  Continue Coreg .   - Continue statin and Zetia    Paroxysmal A-fib - Currently rate controlled.  Continue Eliquis  and Coreg    CAD with history of stenting Chronic systolic heart failure -Stable.  No chest pain.  Continue Plavix , statin, beta-blocker - Currently compensated.  Continue diet and fluid restriction.  Patient is not on any diuretics at home.  Last EF was 25 to 30% in 03/2023 echo.  Outpatient follow-up with cardiology   CKD stage IIIb -creatinine currently stable.  Monitor intermittently as an outpatient   Thrombocytopenia -Resolved   Chronic pain with opiate dependence Neuropathy -Continue Lyrica  and fentanyl  patch.  Outpatient follow-up with PCP and/oral pain management    Discharge Instructions   Allergies as of 10/19/2023   No Known Allergies      Medication List     STOP taking these medications    dicloxacillin 500 MG capsule Commonly known as: DYNAPEN       TAKE these medications    acetaminophen  500 MG tablet Commonly known as: TYLENOL  Take 500-1,000 mg by mouth every 6 (six) hours as needed for moderate pain (pain score 4-6).   apixaban  5 MG Tabs tablet Commonly known as: ELIQUIS  Take  5 mg by mouth 2 (two) times daily.    atorvastatin  40 MG tablet Commonly known as: LIPITOR  Take 1 tablet (40 mg total) by mouth daily.   carvedilol  12.5 MG tablet Commonly known as: COREG  Take 0.5 tablets (6.25 mg total) by mouth 2 (two) times daily with a meal.   cefUROXime 500 MG tablet Commonly known as: CEFTIN Take 1 tablet (500 mg total) by mouth 2 (two) times daily for 5 days.   clopidogrel  75 MG tablet Commonly known as: PLAVIX  Take 1 tablet (75 mg total) by mouth daily.   doxycycline 100 MG tablet Commonly known as: VIBRA-TABS Take 1 tablet (100 mg total) by mouth 2 (two) times daily for 5 days.   ezetimibe  10 MG tablet Commonly known as: ZETIA  TAKE 1 TABLET(10 MG) BY MOUTH DAILY   fentaNYL  75 MCG/HR Commonly known as: DURAGESIC  Place 1 patch onto the skin every 3 (three) days.   Jardiance 10 MG Tabs tablet Generic drug: empagliflozin Take 10 mg by mouth daily.   pregabalin  150 MG capsule Commonly known as: LYRICA  Take 1 capsule (150 mg total) by mouth 3 (three) times daily.   traZODone  50 MG tablet Commonly known as: DESYREL  Take 50 mg by mouth at bedtime as needed for sleep.        Follow-up Information     Shane Darling, MD. Call.   Specialty: Gastroenterology Why: Schedule an appointment for colonoscopy Contact information: [address] [place] [address] [phone]         Mazie Speed, MD. Schedule an appointment as soon as possible for a visit in 1 week(s).   Specialty: Family Medicine Contact information: [address] [place] [address] [place] [address] [phone]                No Known Allergies  Consultations: General surgery   Procedures/Studies: CT ABDOMEN PELVIS W CONTRAST Result Date: 10/16/2023 CLINICAL DATA:  R inguinal pain, right upper thigh redness EXAM: CT ABDOMEN AND PELVIS WITH CONTRAST TECHNIQUE: Multidetector CT imaging of the abdomen and pelvis was performed using the standard protocol following bolus  administration of intravenous contrast. RADIATION DOSE REDUCTION: This exam was performed according to the departmental dose-optimization program which includes automated exposure control, adjustment of the mA and/or kV according to patient size and/or use of iterative reconstruction technique. CONTRAST:  OMNIPAQUE  IOHEXOL  300 MG/ML  SOLN COMPARISON:  April 22, 2023 FINDINGS: Lower chest: Small left pleural effusion with left basilar atelectasis.No cardiomegaly. Hepatobiliary: No mass.No radiopaque stones or wall thickening of the gallbladder. No intrahepatic or extrahepatic biliary ductal dilation. The portal veins are patent. Pancreas: No mass or main ductal dilation. No peripancreatic inflammation or fluid collection. Spleen: Normal size. No mass. Adrenals/Urinary Tract: No adrenal masses. Multifocal cortical scarring in both kidneys. No mass. No nephrolithiasis or hydronephrosis. Partially distended urinary bladder without visualized abnormality. Stomach/Bowel: The stomach contains ingested material without focal abnormality. no small bowel wall thickening or inflammation. No small bowel obstruction.The appendix is not visualized, likely surgically absent. Along the posterior aspect of the cecum, there is a fluid-filled structure with mucosal hyperenhancement, measuring 2.2 cm (axial 46), with adjacent stranding. Total colonic diverticulosis is also noted scattered throughout the colon. Vascular/Lymphatic: No aortic aneurysm. Diffuse aortoiliac atherosclerosis. No intraabdominal or pelvic lymphadenopathy. Reproductive: Hysterectomy. No concerning adnexal mass.No free pelvic fluid. Other: No pneumoperitoneum or ascites Musculoskeletal: No acute fracture or destructive lesion. Partially visualized breast implants bilaterally. Multilevel degenerative disc disease of the spine. Skin thickening  and soft tissue irregularity in the right inguinal region, overall measuring 3.9 x 3.3 x 5 (APxTRxCC). No  well-formed abscess or drainable fluid collection. IMPRESSION: 1. Skin thickening and soft tissue irregularity in the right inguinal region, overall measuring 3.9 x 3.3 x 5 (APxTRxCC). This could represent combination of postsurgical change with cellulitis. No well-formed abscess or drainable fluid collection. 2. Along the posterior aspect of the cecum, there is a fluid-filled structure with mucosal hyperenhancement, measuring 2.2 cm (axial 46), with adjacent stranding. While the adjacent stranding could be chronic changes related to the remote retroperitoneal hematoma, this is more worrisome for an acute process, such as acute diverticulitis. Alternatively, in the setting of a prior appendectomy, fluid trapped within the appendiceal stump, due to stump appendicitis could also have this appearance. Nonemergent colonoscopy recommended to exclude underlying neoplasm. 3. Small left pleural effusion. Electronically Signed   By: Rance Burrows M.D.   On: 10/16/2023 17:58      Subjective: Patient seen and examined at bedside.  Feels much better and wants to go home today.  No fever, vomiting, chest pain reported.  Discharge Exam: Vitals:   10/18/23 2051 10/19/23 0514  BP: (!) 126/58 (!) 130/57  Pulse: 65 67  Resp: 16 16  Temp: 98.2 F (36.8 C) 97.6 F (36.4 C)  SpO2: 98% 95%    General: Pt is alert, awake, not in acute distress.  On room air. Cardiovascular: rate controlled, S1/S2 + Respiratory: bilateral decreased breath sounds at bases Abdominal: Soft, NT, ND, bowel sounds + Extremities: Trace lower extremity edema; no cyanosis.  Right inner thigh erythema has significantly improved.    The results of significant diagnostics from this hospitalization (including imaging, microbiology, ancillary and laboratory) are listed below for reference.     Microbiology: No results found for this or any previous visit (from the past 240 hours).   Labs: BNP (last 3 results) Recent Labs     04/12/23 1522 04/22/23 1540  BNP 270.4* 626.3*   Basic Metabolic Panel: Recent Labs  Lab 10/16/23 1333 10/17/23 0308 10/18/23 0356  NA 138 142 144  K 4.7 3.8 3.9  CL 103 112* 111  CO2 22 22 25   GLUCOSE 93 98 101*  BUN 22 21 23   CREATININE 1.25* 1.23* 1.23*  CALCIUM  9.5 8.7* 9.0  MG  --  2.1 2.2   Liver Function Tests: Recent Labs  Lab 10/17/23 0308  AST 18  ALT 16  ALKPHOS 63  BILITOT 0.6  PROT 5.6*  ALBUMIN  2.6*   No results for input(s): "LIPASE", "AMYLASE" in the last 168 hours. No results for input(s): "AMMONIA" in the last 168 hours. CBC: Recent Labs  Lab 10/16/23 1333 10/17/23 0308 10/18/23 0356  WBC 8.9 7.4 4.9  NEUTROABS 7.2  --  3.2  HGB 12.0 11.9* 11.7*  HCT 39.3 36.9 37.0  MCV 80.5 78.0* 78.7*  PLT 149* 138* 157   Cardiac Enzymes: No results for input(s): "CKTOTAL", "CKMB", "CKMBINDEX", "TROPONINI" in the last 168 hours. BNP: Invalid input(s): "POCBNP" CBG: No results for input(s): "GLUCAP" in the last 168 hours. D-Dimer No results for input(s): "DDIMER" in the last 72 hours. Hgb A1c No results for input(s): "HGBA1C" in the last 72 hours. Lipid Profile No results for input(s): "CHOL", "HDL", "LDLCALC", "TRIG", "CHOLHDL", "LDLDIRECT" in the last 72 hours. Thyroid function studies No results for input(s): "TSH", "T4TOTAL", "T3FREE", "THYROIDAB" in the last 72 hours.  Invalid input(s): "FREET3" Anemia work up No results for input(s): "VITAMINB12", "FOLATE", "FERRITIN", "TIBC", "  IRON", "RETICCTPCT" in the last 72 hours. Urinalysis    Component Value Date/Time   COLORURINE YELLOW (A) 04/22/2023 2041   APPEARANCEUR CLEAR (A) 04/22/2023 2041   LABSPEC 1.043 (H) 04/22/2023 2041   PHURINE 6.0 04/22/2023 2041   GLUCOSEU NEGATIVE 04/22/2023 2041   HGBUR SMALL (A) 04/22/2023 2041   BILIRUBINUR NEGATIVE 04/22/2023 2041   KETONESUR NEGATIVE 04/22/2023 2041   PROTEINUR NEGATIVE 04/22/2023 2041   NITRITE NEGATIVE 04/22/2023 2041   LEUKOCYTESUR  NEGATIVE 04/22/2023 2041   Sepsis Labs Recent Labs  Lab 10/16/23 1333 10/17/23 0308 10/18/23 0356  WBC 8.9 7.4 4.9   Microbiology No results found for this or any previous visit (from the past 240 hours).   Time coordinating discharge: 35 minutes  SIGNED:   Audria Leather, MD  [facility] 10/19/2023, 8:03 AM

## 2023-10-19 NOTE — Telephone Encounter (Signed)
 Patient actually needs hosp f/u visit and colonoscopy can be discussed during that visit.  Can you please get her scheduled.  Thank you  Copied from CRM [id number]. Topic: Appointments - Appointment Scheduling >> Oct 19, 2023 12:23 PM Emylou G wrote: Was discharged from hospital .. Was told need to get a colonoscopy.. but she needed a referral. It was suggested to see if she can use [facility] for the referral Dr.Locklear their number [phone]

## 2023-10-19 NOTE — Care Management Important Message (Signed)
 Important Message  Patient Details  Name: Dawn Bruce MRN: [id number] Date of Birth: June 04, 1945   Important Message Given:  Yes - Medicare IM     Janith Melnick 10/19/2023, 10:18 AM

## 2023-10-19 NOTE — Evaluation (Signed)
 Physical Therapy Brief Evaluation and Discharge Note Patient Details Name: Dawn Bruce MRN: [id number] DOB: Aug 31, 1945 Today's Date: 10/19/2023   History of Present Illness  Pt presented to emergency department 5/27 complaining of increased redness of the right thigh despite being on oral antibiotics as an outpatient.  On presentation, she was afebrile with WBC of 8.9.  CT of abdomen and pelvis showed findings suggestive of right inguinal region cellulitis with no abscess or drainable fluid collection along with findings suggestive of possible acute diverticulitis versus acute appendicitis.  Pt with right LE cellulitis.  Will f/u with gastro MD regarding diverticulitis.  PMH:  HTN, PAF, CHF, CAD, recent right groin I& D  Clinical Impression  Pt admitted with above diagnosis.  Pt currently without significant functional limitations.  Pt did have more difficulty with mod challenges to balance however was able to self correct. At this time do not feel that pt needs any f/u therapy and she has all needed equipment.  Will sign off.    PT Assessment Patient does not need any further PT services  Assistance Needed at Discharge  PRN    Equipment Recommendations None recommended by PT  Recommendations for Other Services       Precautions/Restrictions Precautions Precautions: None Restrictions Weight Bearing Restrictions Per Provider Order: No        Mobility  Bed Mobility Rolling: Independent Supine/Sidelying to sit: Independent Sit to supine/sidelying: Independent    Transfers Overall transfer level: Independent                      Ambulation/Gait Ambulation/Gait assistance: Supervision Gait Distance (Feet): 400 Feet Assistive device: None Gait Pattern/deviations: Step-through pattern, Decreased stride length, Drifts right/left Gait Speed: Pace WFL General Gait Details: Pt with occasional LOB with mod challenges which pt self corrects.  No LOB without  challenges.  Home Activity Instructions    Stairs Stairs: Yes Stairs assistance: Contact guard assist Stair Management: One rail Right, Forwards, Step to pattern Number of Stairs: 2    Modified Rankin (Stroke Patients Only)        Balance Overall balance assessment: Needs assistance Sitting-balance support: No upper extremity supported, Feet supported Sitting balance-Leahy Scale: Good     Standing balance support: No upper extremity supported, During functional activity Standing balance-Leahy Scale: Good   Standardized Balance Assessment Standardized Balance Assessment : Dynamic Gait Index Dynamic Gait Index Level Surface: Normal Change in Gait Speed: Normal Gait with Horizontal Head Turns: Mild Impairment Gait with Vertical Head Turns: Mild Impairment Gait and Pivot Turn: Mild Impairment Step Over Obstacle: Normal Step Around Obstacles: Normal Steps: Moderate Impairment Total Score: 19      Pertinent Vitals/Pain PT - Brief Vital Signs All Vital Signs Stable: Yes Pain Assessment Pain Assessment: No/denies pain     Home Living Family/patient expects to be discharged to:: Private residence Living Arrangements: Spouse/significant other;Children Available Help at Discharge: Family;Available 24 hours/day Home Environment: Level entry   Home Equipment: Shower seat;Grab bars - tub/shower;Rolling Walker (2 wheels);Grab bars - toilet;BSC/3in1        Prior Function Level of Independence: Independent Comments: No falls per pt    UE/LE Assessment   UE ROM/Strength/Tone/Coordination: WFL    LE ROM/Strength/Tone/Coordination: [facility]      Communication   Communication Communication: No apparent difficulties     Cognition Overall Cognitive Status: Appears within functional limits for tasks assessed/performed       General Comments      Exercises  Assessment/Plan    PT Problem List         PT Visit Diagnosis Muscle weakness (generalized)  (M62.81)    No Skilled PT All education completed;Patient at baseline level of functioning;Patient is independent with all acitivity/mobility   Co-evaluation                AMPAC 6 Clicks Help needed turning from your back to your side while in a flat bed without using bedrails?: None Help needed moving from lying on your back to sitting on the side of a flat bed without using bedrails?: None Help needed moving to and from a bed to a chair (including a wheelchair)?: None Help needed standing up from a chair using your arms (e.g., wheelchair or bedside chair)?: None Help needed to walk in hospital room?: A Little Help needed climbing 3-5 steps with a railing? : A Little 6 Click Score: 22      End of Session Equipment Utilized During Treatment: Gait belt Activity Tolerance: Patient tolerated treatment well Patient left: in bed;with call bell/phone within reach (nurse in room) Nurse Communication: Mobility status PT Visit Diagnosis: Muscle weakness (generalized) (M62.81)     Time: 1610-9604 PT Time Calculation (min) (ACUTE ONLY): 10 min  Charges:   PT Evaluation $PT Eval Low Complexity: 1 Low      Cylas Falzone M,PT Acute Rehab Services [phone]   Florencia Hunter  10/19/2023, 10:18 AM

## 2023-10-19 NOTE — Telephone Encounter (Signed)
 Spoke with patient and she choose to wait to see Dr. B for the hospital f/u & coloscopy on 6/16 instead of being seen earlier on 6/4

## 2023-10-22 ENCOUNTER — Ambulatory Visit: Payer: Medicare Other

## 2023-10-22 VITALS — Wt 120.0 lb

## 2023-10-22 DIAGNOSIS — Z0000 Encounter for general adult medical examination without abnormal findings: Secondary | ICD-10-CM | POA: Diagnosis not present

## 2023-10-22 NOTE — Progress Notes (Signed)
 Because this visit was a virtual/telehealth visit,  certain criteria was not obtained, such a blood pressure, CBG if applicable, and timed get up and go. Any medications not marked as "taking" were not mentioned during the medication reconciliation part of the visit. Any vitals not documented were not able to be obtained due to this being a telehealth visit or patient was unable to self-report a recent blood pressure reading due to a lack of equipment at home via telehealth. Vitals that have been documented are verbally provided by the patient.   Subjective:   Dawn Bruce is a 78 y.o. who presents for a Medicare Wellness preventive visit.  As a reminder, Annual Wellness Visits don't include a physical exam, and some assessments may be limited, especially if this visit is performed virtually. We may recommend an in-person follow-up visit with your provider if needed.  Visit Complete: Virtual I connected with  Cloria Danger on 10/22/23 by a audio enabled telemedicine application and verified that I am speaking with the correct person using two identifiers.  Patient Location: Home  Provider Location: Office/Clinic  I discussed the limitations of evaluation and management by telemedicine. The patient expressed understanding and agreed to proceed.  Vital Signs: Because this visit was a virtual/telehealth visit, some criteria may be missing or patient reported. Any vitals not documented were not able to be obtained and vitals that have been documented are patient reported.  VideoDeclined- This patient declined Librarian, academic. Therefore the visit was completed with audio only.  Persons Participating in Visit: Patient.  AWV Questionnaire: Yes: Patient Medicare AWV questionnaire was completed by the patient on 10/18/2023; I have confirmed that all information answered by patient is correct and no changes since this date.        Objective:     Today's Vitals    10/22/23 1543  Weight: 120 lb (54.4 kg)  PainSc: 0-No pain   Body mass index is 21.26 kg/m.     10/22/2023    3:45 PM 10/16/2023   10:30 PM 04/27/2023    1:35 PM 04/25/2023   12:19 PM 04/23/2023    5:33 PM 04/13/2023    4:48 PM 04/13/2023    8:00 AM  Advanced Directives  Does Patient Have a Medical Advance Directive? Yes Yes Yes Yes No No Yes  Type of Estate agent of [place];Living will Healthcare Power of Attorney Living will Living will   Living will  Does patient want to make changes to medical advance directive?  No - Patient declined No - Patient declined No - Guardian declined No - Patient declined  No - Patient declined  Copy of Healthcare Power of Attorney in Chart? No - copy requested No - copy requested       Would patient like information on creating a medical advance directive?   No - Patient declined No - Patient declined No - Patient declined No - Patient declined No - Patient declined    Current Medications (verified) Outpatient Encounter Medications as of 10/22/2023  Medication Sig   acetaminophen  (TYLENOL ) 500 MG tablet Take 500-1,000 mg by mouth every 6 (six) hours as needed for moderate pain (pain score 4-6).   apixaban  (ELIQUIS ) 5 MG TABS tablet Take 5 mg by mouth 2 (two) times daily.   atorvastatin  (LIPITOR ) 40 MG tablet Take 1 tablet (40 mg total) by mouth daily.   carvedilol  (COREG ) 12.5 MG tablet Take 0.5 tablets (6.25 mg total) by mouth 2 (two) times daily  with a meal.   cefUROXime  (CEFTIN ) 500 MG tablet Take 1 tablet (500 mg total) by mouth 2 (two) times daily for 5 days.   clopidogrel  (PLAVIX ) 75 MG tablet Take 1 tablet (75 mg total) by mouth daily.   doxycycline  (VIBRA -TABS) 100 MG tablet Take 1 tablet (100 mg total) by mouth 2 (two) times daily for 5 days.   empagliflozin  (JARDIANCE ) 10 MG TABS tablet Take 10 mg by mouth daily.   ezetimibe  (ZETIA ) 10 MG tablet TAKE 1 TABLET(10 MG) BY MOUTH DAILY   fentaNYL  (DURAGESIC ) 75 MCG/HR Place 1  patch onto the skin every 3 (three) days.   pregabalin  (LYRICA ) 150 MG capsule Take 1 capsule (150 mg total) by mouth 3 (three) times daily.   traZODone  (DESYREL ) 50 MG tablet Take 50 mg by mouth at bedtime as needed for sleep.   No facility-administered encounter medications on file as of 10/22/2023.    Allergies (verified) Patient has no known allergies.   History: Past Medical History:  Diagnosis Date   Actinic keratosis    Chronic pain    GERD (gastroesophageal reflux disease) ?   Taking Pepcid    History of basal cell carcinoma (BCC) 05/02/2021   right upper back paraspinal   History of SCC (squamous cell carcinoma) of skin 09/10/2019   left dorsum wrist ED&C done 10/28/19   History of SCC (squamous cell carcinoma) of skin 03/04/2020   right dorsum hand   History of squamous cell carcinoma in situ (SCCIS) 03/04/2020   left dorsum wrist  ED&C   History of squamous cell carcinoma in situ (SCCIS) 03/04/2020   right medial infraorbital  ED&C 04/28/2020   History of squamous cell carcinoma in situ (SCCIS) 10/28/2019   right dorsum hand proximal lateral   History of squamous cell carcinoma in situ (SCCIS) 10/28/2019   right dorsum hand proximal medial   Hyperlipidemia    Hypertension    NSTEMI (non-ST elevated myocardial infarction) (HCC) 04/12/2023   Renal disorder    Squamous cell carcinoma in situ 10/31/2021   left distal  tricep, EDC   Squamous cell carcinoma of skin 08/28/2022   Right volar forearm - EDC   Past Surgical History:  Procedure Laterality Date   ABDOMINAL HYSTERECTOMY     APPENDECTOMY  April 2023   Burst appendix   APPLICATION OF WOUND VAC Right 04/25/2023   Procedure: APPLICATION OF WOUND VAC;  Surgeon: Jackquelyn Mass, MD;  Location: [facility];  Service: Vascular;  Laterality: Right;   APPLICATION OF WOUND VAC Right 04/27/2023   Procedure: WOUND VAC EXCHANGE;  Surgeon: Jackquelyn Mass, MD;  Location: [facility];  Service: Vascular;  Laterality: Right;    APPLICATION OF WOUND VAC Right 05/01/2023   Procedure: APPLICATION OF WOUND VAC;  Surgeon: Jackquelyn Mass, MD;  Location: [facility];  Service: Vascular;  Laterality: Right;   AUGMENTATION MAMMAPLASTY Bilateral    25-30 years ago   CARDIOVERSION N/A 12/01/2020   Procedure: CARDIOVERSION;  Surgeon: Constancia Delton, MD;  Location: [facility];  Service: Cardiovascular;  Laterality: N/A;   CAROTID ARTERY ANGIOPLASTY Right 1995(approximate)   CAROTID ENDARTERECTOMY  2001   CORONARY STENT INTERVENTION N/A 04/13/2023   Procedure: CORONARY STENT INTERVENTION;  Surgeon: Percival Brace, MD;  Location: [facility];  Service: Cardiovascular;  Laterality: N/A;   COSMETIC SURGERY     ELBOW SURGERY Left    ENDARTERECTOMY FEMORAL Right 04/13/2023   Procedure: Right groin exploration with repair of right external iliac artery and right  circumflex artery;  Surgeon: Lesta Rater, MD;  Location: [facility];  Service: Vascular;  Laterality: Right;   FRACTURE SURGERY  2016   Arm   INCISION AND DRAINAGE OF WOUND Right 04/25/2023   Procedure: IRRIGATION AND DEBRIDEMENT WOUND;  Surgeon: Jackquelyn Mass, MD;  Location: [facility];  Service: Vascular;  Laterality: Right;   INCISION AND DRAINAGE OF WOUND Right 04/27/2023   Procedure: IRRIGATION AND DEBRIDEMENT WOUND;  Surgeon: Jackquelyn Mass, MD;  Location: [facility];  Service: Vascular;  Laterality: Right;   INCISION AND DRAINAGE OF WOUND Right 05/01/2023   Procedure: IRRIGATION AND DEBRIDEMENT WOUND;  Surgeon: Jackquelyn Mass, MD;  Location: [facility];  Service: Vascular;  Laterality: Right;   LEFT HEART CATH AND CORONARY ANGIOGRAPHY N/A 04/13/2023   Procedure: LEFT HEART CATH AND CORONARY ANGIOGRAPHY;  Surgeon: Percival Brace, MD;  Location: [facility];  Service: Cardiovascular;  Laterality: N/A;   THORACENTESIS N/A 12/27/2020   Procedure: Antoine Kirsch;  Surgeon: Prudy Brownie, DO;  Location: [facility];  Service:  Pulmonary;  Laterality: N/A;   TONSILLECTOMY     TUBAL LIGATION  ?   Family History  Problem Relation Age of Onset   Cancer Mother    Cancer Father    Cancer Sister    Breast cancer Neg Hx    Social History   Socioeconomic History   Marital status: Married    Spouse name: Not on file   Number of children: 3   Years of education: Not on file   Highest education level: Some college, no degree  Occupational History   Occupation: retired  Tobacco Use   Smoking status: Former    Current packs/day: 0.00    Average packs/day: 0.3 packs/day for 15.0 years (3.8 ttl pk-yrs)    Types: Cigarettes    Start date: 64    Quit date: 1990    Years since quitting: 35.4    Passive exposure: Past   Smokeless tobacco: Never   Tobacco comments:    quit around 1990-1995 (?)  Vaping Use   Vaping status: Never Used  Substance and Sexual Activity   Alcohol  use: Yes    Alcohol /week: 14.0 standard drinks of alcohol     Types: 14 Standard drinks or equivalent per week    Comment: 2 scotch shots   Drug use: Never   Sexual activity: Not Currently    Birth control/protection: None  Other Topics Concern   Not on file  Social History Narrative   Not on file   Social Drivers of Health   Financial Resource Strain: Low Risk  (10/22/2023)   Overall Financial Resource Strain (CARDIA)    Difficulty of Paying Living Expenses: Not hard at all  Food Insecurity: No Food Insecurity (10/22/2023)   Hunger Vital Sign    Worried About Running Out of Food in the Last Year: Never true    Ran Out of Food in the Last Year: Never true  Transportation Needs: No Transportation Needs (10/22/2023)   PRAPARE - Administrator, Civil Service (Medical): No    Lack of Transportation (Non-Medical): No  Physical Activity: Inactive (10/22/2023)   Exercise Vital Sign    Days of Exercise per Week: 0 days    Minutes of Exercise per Session: 0 min  Stress: No Stress Concern Present (10/22/2023)   [employer] of  Occupational Health - Occupational Stress Questionnaire    Feeling of Stress : Only a little  Social Connections: Moderately Isolated (10/22/2023)  Social Advertising account executive [NHANES]    Frequency of Communication with Friends and Family: More than three times a week    Frequency of Social Gatherings with Friends and Family: Once a week    Attends Religious Services: Never    Database administrator or Organizations: No    Attends Engineer, structural: Never    Marital Status: Married    Tobacco Counseling Counseling given: Not Answered Tobacco comments: quit around [phone] (?)    Clinical Intake:  Pre-visit preparation completed: Yes  Pain : No/denies pain Pain Score: 0-No pain     BMI - recorded: 21.26 Nutritional Status: BMI of 19-24  Normal Nutritional Risks: None Diabetes: No  Lab Results  Component Value Date   HGBA1C 5.5 06/01/2022   HGBA1C 5.5 12/12/2021   HGBA1C 6.5 (H) 08/31/2021     How often do you need to have someone help you when you read instructions, pamphlets, or other written materials from your doctor or pharmacy?: 1 - Never  Interpreter Needed?: No  Information entered by :: Dashea Mcmullan N. Keanen Dohse, LPN.   Activities of Daily Living     10/22/2023    3:47 PM 10/18/2023    9:54 AM  In your present state of health, do you have any difficulty performing the following activities:  Hearing? 0 0  Vision? 0 0  Difficulty concentrating or making decisions? 0 0  Walking or climbing stairs? 0   Dressing or bathing? 0 0  Doing errands, shopping? 0 0  Preparing Food and eating ? N N  Using the Toilet? N N  In the past six months, have you accidently leaked urine? Y Y  Do you have problems with loss of bowel control? N N  Managing your Medications? N N  Managing your Finances? N N  Housekeeping or managing your Housekeeping? N N    Patient Care Team: Mazie Speed, MD as PCP - General (Family Medicine) Constancia Delton, MD as PCP - Cardiology (Cardiology) Elta Halter, MD (Dermatology) Nicolas Barren, MD as Consulting Physician (Nephrology) Bary Boss., MD (Ophthalmology)  I have updated your Care Teams any recent Medical Services you may have received from other providers in the past year.     Assessment:    This is a routine wellness examination for [facility].  Hearing/Vision screen Hearing Screening - Comments:: Patient has adequate hearing. Vision Screening - Comments:: Patient has adequate vision, wears reading glasses.  Patient sees Dr. Parke Boll   Goals Addressed             This Visit's Progress    10/22/2023: To get better         Depression Screen     10/22/2023    3:47 PM 06/11/2023   11:52 AM 12/08/2022   11:31 AM 10/17/2022    2:42 PM 06/05/2022    1:46 PM 06/01/2022    1:29 PM 04/24/2022    4:58 PM  PHQ 2/9 Scores  PHQ - 2 Score 0 1 0 0 0 0 0  PHQ- 9 Score 0  1  0 0 1    Fall Risk     10/22/2023    3:45 PM 10/18/2023    9:54 AM 12/08/2022   11:31 AM 10/13/2022    6:43 PM 06/01/2022    1:29 PM  Fall Risk   Falls in the past year? 0 0 0 0 0  Number falls in past yr: 0 0 0 0 0  Injury with Fall? 0 0 0 0 0  Risk for fall due to : No Fall Risks    No Fall Risks  Follow up Falls evaluation completed    Falls evaluation completed    MEDICARE RISK AT HOME:  Medicare Risk at Home Any stairs in or around the home?: No If so, are there any without handrails?: No Home free of loose throw rugs in walkways, pet beds, electrical cords, etc?: Yes Adequate lighting in your home to reduce risk of falls?: Yes Life alert?: No Use of a cane, walker or w/c?: No Grab bars in the bathroom?: Yes Shower chair or bench in shower?: Yes Elevated toilet seat or a handicapped toilet?: No  TIMED UP AND GO:  Was the test performed?  No  Cognitive Function: 6CIT completed    10/22/2023    4:09 PM  MMSE - Mini Mental State Exam  Not completed: Unable to complete         10/22/2023    4:06 PM 10/17/2022    2:45 PM 10/10/2021    1:56 PM 04/20/2020   10:11 AM 04/08/2019    1:47 PM  6CIT Screen  What Year? 0 points 0 points 0 points 0 points 0 points  What month? 0 points 0 points 0 points 0 points 0 points  What time? 0 points 0 points 0 points 0 points 0 points  Count back from 20 0 points 0 points 0 points 0 points 0 points  Months in reverse 0 points 0 points 0 points 0 points 0 points  Repeat phrase 0 points 0 points 0 points 0 points 0 points  Total Score 0 points 0 points 0 points 0 points 0 points    Immunizations Immunization History  Administered Date(s) Administered   Fluad Quad(high Dose 65+) 04/09/2019, 03/09/2022   Fluzone Influenza virus vaccine,trivalent (IIV3), split virus 03/06/2013   Hepatitis A 02/07/2011, 08/07/2011   Influenza Split 04/16/2012   Influenza, High Dose Seasonal PF 02/27/2014, 03/11/2015, 04/18/2016, 05/28/2017   Influenza-Unspecified 03/20/2020, 02/24/2021, 03/19/2023   Moderna Covid-19 Fall Seasonal Vaccine 7yrs & older 03/19/2023   PFIZER(Purple Top)SARS-COV-2 Vaccination 06/02/2019, 06/23/2019   Pneumococcal Conjugate-13 02/05/2014   Pneumococcal Polysaccharide-23 12/16/2009, 04/18/2016   Tdap 09/28/2007   Varicella 09/27/2009, 11/01/2017   Zoster Recombinant(Shingrix) 11/01/2017    Screening Tests Health Maintenance  Topic Date Due   DTaP/Tdap/Td (2 - Td or Tdap) 09/27/2017   Zoster Vaccines- Shingrix (2 of 2) 12/27/2017   COVID-19 Vaccine (4 - Mixed Product risk 2024-25 season) 09/17/2023   INFLUENZA VACCINE  12/21/2023   Medicare Annual Wellness (AWV)  10/21/2024   Pneumonia Vaccine 44+ Years old  Completed   DEXA SCAN  Completed   Hepatitis C Screening  Completed   HPV VACCINES  Aged Out   Meningococcal B Vaccine  Aged Out   Colonoscopy  Discontinued    Health Maintenance  Health Maintenance Due  Topic Date Due   DTaP/Tdap/Td (2 - Td or Tdap) 09/27/2017   Zoster Vaccines- Shingrix (2 of 2)  12/27/2017   COVID-19 Vaccine (4 - Mixed Product risk 2024-25 season) 09/17/2023   Health Maintenance Items Addressed: Yes Patient aware of current care gaps.  Immunization record was verified by [facility] and updated in patient's chart. Patient is due for Dtap and Covid vaccine.  Additional Screening:  Vision Screening: Recommended annual ophthalmology exams for early detection of glaucoma and other disorders of the eye. Would you like a referral to an eye doctor? No  Dental Screening: Recommended annual dental exams for proper oral hygiene  Community Resource Referral / Chronic Care Management: CRR required this visit?  No   CCM required this visit?  No   Plan:    I have personally reviewed and noted the following in the patient's chart:   Medical and social history Use of alcohol , tobacco or illicit drugs  Current medications and supplements including opioid prescriptions. Patient is currently taking opioid prescriptions. Information provided to patient regarding non-opioid alternatives. Patient advised to discuss non-opioid treatment plan with their provider. Functional ability and status Nutritional status Physical activity Advanced directives List of other physicians Hospitalizations, surgeries, and ER visits in previous 12 months Vitals Screenings to include cognitive, depression, and falls Referrals and appointments  In addition, I have reviewed and discussed with patient certain preventive protocols, quality metrics, and best practice recommendations. A written personalized care plan for preventive services as well as general preventive health recommendations were provided to patient.   Margette Sheldon, LPN   12/20/1914   After Visit Summary: (MyChart) Due to this being a telephonic visit, the after visit summary with patients personalized plan was offered to patient via MyChart   Notes: Patient aware of current care gaps.  Immunization record was verified by [facility]  and updated in patient's chart. Patient is due for Dtap and Covid vaccine. This nurse contacted [facility] to verify second vaccine for Shingrix.  The pharmacy has no record on file after 5 years.

## 2023-10-22 NOTE — Patient Instructions (Signed)
 Dawn Bruce , Thank you for taking time out of your busy schedule to complete your Annual Wellness Visit with me. I enjoyed our conversation and look forward to speaking with you again next year. I, as well as your care team,  appreciate your ongoing commitment to your health goals. Please review the following plan we discussed and let me know if I can assist you in the future. Your Game plan/ To Do List    Referrals: If you haven't heard from the office you've been referred to, please reach out to them at the phone provided.   Follow up Visits: Next Medicare AWV with our clinical staff: 10/29/2024 at 3:50 pm Phone Visit with Nurse Health Advisor   Have you seen your provider in the last 6 months (3 months if uncontrolled diabetes)? Yes Next Office Visit with your provider: 11/05/2023 at 2:00 pm hospital follow up with Dr. B  Clinician Recommendations:  Aim for 30 minutes of exercise or brisk walking, 6-8 glasses of water , and 5 servings of fruits and vegetables each day.       This is a list of the screening recommended for you and due dates:  Health Maintenance  Topic Date Due   DTaP/Tdap/Td vaccine (2 - Td or Tdap) 09/27/2017   Zoster (Shingles) Vaccine (2 of 2) 12/27/2017   COVID-19 Vaccine (4 - Mixed Product risk 2024-25 season) 09/17/2023   Flu Shot  12/21/2023   Medicare Annual Wellness Visit  10/21/2024   Pneumonia Vaccine  Completed   DEXA scan (bone density measurement)  Completed   Hepatitis C Screening  Completed   HPV Vaccine  Aged Out   Meningitis B Vaccine  Aged Out   Colon Cancer Screening  Discontinued    Advanced directives: (In Chart) A copy of your advanced directives are scanned into your chart should your provider ever need it. Advance Care Planning is important because it:  [x]  Makes sure you receive the medical care that is consistent with your values, goals, and preferences  [x]  It provides guidance to your family and loved ones and reduces their decisional  burden about whether or not they are making the right decisions based on your wishes.  Follow the link provided in your after visit summary or read over the paperwork we have mailed to you to help you started getting your Advance Directives in place. If you need assistance in completing these, please reach out to us  so that we can help you!  See attachments for Preventive Care and Fall Prevention Tips.

## 2023-10-31 DIAGNOSIS — J90 Pleural effusion, not elsewhere classified: Secondary | ICD-10-CM | POA: Diagnosis not present

## 2023-10-31 DIAGNOSIS — K4090 Unilateral inguinal hernia, without obstruction or gangrene, not specified as recurrent: Secondary | ICD-10-CM | POA: Diagnosis not present

## 2023-10-31 DIAGNOSIS — R5381 Other malaise: Secondary | ICD-10-CM | POA: Diagnosis not present

## 2023-11-05 ENCOUNTER — Telehealth (INDEPENDENT_AMBULATORY_CARE_PROVIDER_SITE_OTHER): Admitting: Family Medicine

## 2023-11-05 ENCOUNTER — Inpatient Hospital Stay: Admitting: Family Medicine

## 2023-11-05 DIAGNOSIS — R935 Abnormal findings on diagnostic imaging of other abdominal regions, including retroperitoneum: Secondary | ICD-10-CM | POA: Diagnosis not present

## 2023-11-05 DIAGNOSIS — L03314 Cellulitis of groin: Secondary | ICD-10-CM | POA: Diagnosis not present

## 2023-11-05 NOTE — Assessment & Plan Note (Addendum)
 It seems as though patient's cellulitis has resolved. We were unfortunately not able to visualize this on the virtual visit today but the patient feels that it has resolved and looks much better. Hospitalist team wanted her to have repeat labs at her follow up but patient is feeling better and amenable to deferring additional labs at this time. -continue to monitor

## 2023-11-05 NOTE — Progress Notes (Unsigned)
 MyChart Video Visit    Virtual Visit via Video Note   This format is felt to be most appropriate for this patient at this time. Physical exam was limited by quality of the video and audio technology used for the visit.   Patient location: Home Provider location: Office  I discussed the limitations of evaluation and management by telemedicine and the availability of in person appointments. The patient expressed understanding and agreed to proceed.  Patient: Dawn Bruce   DOB: 03/05/1946   78 y.o. Female  MRN: [id number] Visit Date: 11/05/2023  Today's healthcare provider: Aden Agreste, MD   Chief Complaint  Patient presents with   Hospitalization Follow-up   Subjective     Patient is presenting today to discuss details following her hospital admission for her cellulitis. She initially presented to the ED on 5/27 and was subsequently admitted for IV antibiotic tratment. She was discharged on 5/30. Since then, she has finished her course of oral antibiotics and feels that her cellulitis has resolved. She has not had any new fevers or chills and is not tender or hurting on her right thigh.   She states she mainly made this appointment today because she needs a GI referral for a colonoscopy. While admitted, she had an abdominal CT scan which revealed inflammatory changes in the colon consistent with either appendicitis or diverticulitis. The surgeons taking care of her did not think this to be as likely a diagnosis as she was not in any pain or having any nausea or vomiting. They did recommend she follow up with GI for colonoscopy.  Medications: Outpatient Medications Prior to Visit  Medication Sig   acetaminophen  (TYLENOL ) 500 MG tablet Take 500-1,000 mg by mouth every 6 (six) hours as needed for moderate pain (pain score 4-6).   apixaban  (ELIQUIS ) 5 MG TABS tablet Take 5 mg by mouth 2 (two) times daily.   atorvastatin  (LIPITOR ) 40 MG tablet Take 1 tablet (40 mg total) by  mouth daily.   carvedilol  (COREG ) 12.5 MG tablet Take 0.5 tablets (6.25 mg total) by mouth 2 (two) times daily with a meal.   clopidogrel  (PLAVIX ) 75 MG tablet Take 1 tablet (75 mg total) by mouth daily.   empagliflozin  (JARDIANCE ) 10 MG TABS tablet Take 10 mg by mouth daily.   ezetimibe  (ZETIA ) 10 MG tablet TAKE 1 TABLET(10 MG) BY MOUTH DAILY   fentaNYL  (DURAGESIC ) 75 MCG/HR Place 1 patch onto the skin every 3 (three) days.   pregabalin  (LYRICA ) 150 MG capsule Take 1 capsule (150 mg total) by mouth 3 (three) times daily.   traZODone  (DESYREL ) 50 MG tablet Take 50 mg by mouth at bedtime as needed for sleep.   No facility-administered medications prior to visit.    Last CBC Lab Results  Component Value Date   WBC 4.9 10/18/2023   HGB 11.7 (L) 10/18/2023   HCT 37.0 10/18/2023   MCV 78.7 (L) 10/18/2023   MCH 24.9 (L) 10/18/2023   RDW 19.2 (H) 10/18/2023   PLT 157 10/18/2023   Last metabolic panel Lab Results  Component Value Date   GLUCOSE 101 (H) 10/18/2023   NA 144 10/18/2023   K 3.9 10/18/2023   CL 111 10/18/2023   CO2 25 10/18/2023   BUN 23 10/18/2023   CREATININE 1.23 (H) 10/18/2023   GFRNONAA 45 (L) 10/18/2023   CALCIUM  9.0 10/18/2023   PHOS 6.8 (H) 05/04/2023   PROT 5.6 (L) 10/17/2023   ALBUMIN  2.6 (L) 10/17/2023   LABGLOB 2.5  06/01/2022   AGRATIO 1.5 06/01/2022   BILITOT 0.6 10/17/2023   ALKPHOS 63 10/17/2023   AST 18 10/17/2023   ALT 16 10/17/2023   ANIONGAP 8 10/18/2023     {See past labs  Heme  Chem  Endocrine  Serology  Results Review (optional):1}   Objective    There were no vitals taken for this visit.  BP Readings from Last 3 Encounters:  10/19/23 (!) 120/56  09/25/23 (!) 158/77  06/08/23 132/65   Wt Readings from Last 3 Encounters:  10/22/23 120 lb (54.4 kg)  10/19/23 119 lb 7.8 oz (54.2 kg)  09/25/23 118 lb 3.2 oz (53.6 kg)    {See vitals history (optional):1}    Physical Exam Constitutional:      Appearance: Normal appearance.    Neurological:     Mental Status: She is alert and oriented to person, place, and time. Mental status is at baseline.   Psychiatric:        Mood and Affect: Mood normal.        Behavior: Behavior normal.      Assessment & Plan     Problem List Items Addressed This Visit       Other   Cellulitis of groin, right   It seems as though patient's cellulitis has resolved. We were unfortunately not able to visualize this on the virtual visit today but the patient feels that it has resolved and looks much better. Hospitalist team wanted her to have repeat labs at her follow up but patient is feeling better and amenable to deferring additional labs at this time. -continue to monitor       Abnormal CT of the abdomen - Primary   Patient was found to have inflammatory changes on abdominal CT that were not consistent symptomatically with appendicitis or diverticulitis despite appearing so on imaging. Surgeons wanted GI referral for colonoscopy. Agree it is best to investigate this further. -Referral sent to [facility]      Relevant Orders   Ambulatory referral to Gastroenterology     No follow-ups on file.     I discussed the assessment and treatment plan with the patient. The patient was provided an opportunity to ask questions and all were answered. The patient agreed with the plan and demonstrated an understanding of the instructions.   The patient was advised to call back or seek an in-person evaluation if the symptoms worsen or if the condition fails to improve as anticipated.  I provided 15 minutes of non-face-to-face time during this encounter.

## 2023-11-05 NOTE — Assessment & Plan Note (Signed)
 Patient was found to have inflammatory changes on abdominal CT that were not consistent symptomatically with appendicitis or diverticulitis despite appearing so on imaging. Surgeons wanted GI referral for colonoscopy. Agree it is best to investigate this further. -Referral sent to [facility]

## 2023-11-05 NOTE — Assessment & Plan Note (Deleted)
 Patient was found to have inflammatory changes on abdominal CT that were not consistent symptomatically with appendicitis or diverticulitis despite appearing so on imaging. Surgeons wanted GI referral for colonoscopy. Agree it is best to investigate this further. -Referral sent to [facility]

## 2023-11-06 ENCOUNTER — Encounter: Payer: Self-pay | Admitting: Dermatology

## 2023-11-06 ENCOUNTER — Ambulatory Visit: Admitting: Dermatology

## 2023-11-06 ENCOUNTER — Encounter: Payer: Self-pay | Admitting: Family Medicine

## 2023-11-06 DIAGNOSIS — L820 Inflamed seborrheic keratosis: Secondary | ICD-10-CM

## 2023-11-06 DIAGNOSIS — L821 Other seborrheic keratosis: Secondary | ICD-10-CM

## 2023-11-06 DIAGNOSIS — Z5111 Encounter for antineoplastic chemotherapy: Secondary | ICD-10-CM | POA: Diagnosis not present

## 2023-11-06 DIAGNOSIS — Z79899 Other long term (current) drug therapy: Secondary | ICD-10-CM

## 2023-11-06 DIAGNOSIS — L570 Actinic keratosis: Secondary | ICD-10-CM

## 2023-11-06 DIAGNOSIS — Z872 Personal history of diseases of the skin and subcutaneous tissue: Secondary | ICD-10-CM

## 2023-11-06 DIAGNOSIS — W908XXA Exposure to other nonionizing radiation, initial encounter: Secondary | ICD-10-CM

## 2023-11-06 DIAGNOSIS — L578 Other skin changes due to chronic exposure to nonionizing radiation: Secondary | ICD-10-CM | POA: Diagnosis not present

## 2023-11-06 DIAGNOSIS — Z7189 Other specified counseling: Secondary | ICD-10-CM

## 2023-11-06 NOTE — Progress Notes (Signed)
 Follow-Up Visit   Subjective  Dawn Dawn is a 78 y.o. female who presents for the following: patient here today for treatment of spots at face, hands arms  Hx of isks and aks  The patient has spots, moles and lesions to be evaluated, some may be new or changing and the patient may have concern these could be cancer.  The following portions of the chart were reviewed this encounter and updated as appropriate: medications, allergies, medical history  Review of Systems:  No other skin or systemic complaints except as noted in HPI or Assessment and Plan.  Objective  Well appearing patient in no apparent distress; mood and affect are within normal limits.   A focused examination was performed of the following areas: Face, arms, neck  Relevant exam findings are noted in the Assessment and Plan.       forehead x 3, left lateral brow x 1, left forehead x 2, left cheek x 3, right medial cheek x 1, right cheek sideburn area x 2, right upper lip x 1 b/l arms and hands x 12 (25) Erythematous thin papules/macules with gritty scale.  right temple x 1, left temple x 1, right mandible x 2, right anterior neck x 1 (5) Erythematous stuck-on, waxy papule or plaque  Assessment & Plan   SEBORRHEIC KERATOSIS At articulated surface at left lower eyelid see photos Benign. Observe. Observed under dermatoscope  Dr Felipe Horton agrees with diagnosis Will recheck at next follow up - Stuck-on, waxy, tan-brown papules and/or plaques  - Benign-appearing - Discussed benign etiology and prognosis. - Observe - Call for any changes - Benign-appearing.  Observation.  Call clinic for new or changing lesions.  Recommend daily use of broad spectrum spf 30+ sunscreen to sun-exposed areas.    ACTINIC DAMAGE - chronic, secondary to cumulative UV radiation exposure/sun exposure over time - diffuse scaly erythematous macules with underlying dyspigmentation - Recommend daily broad spectrum sunscreen SPF 30+ to  sun-exposed areas, reapply every 2 hours as needed.  - Recommend staying in the shade or wearing long sleeves, sun glasses (UVA+UVB protection) and wide brim hats (4-inch brim around the entire circumference of the hat). - Call for new or changing lesions.  ACTINIC KERATOSIS (25) forehead x 3, left lateral brow x 1, left forehead x 2, left cheek x 3, right medial cheek x 1, right cheek sideburn area x 2, right upper lip x 1 b/l arms and hands x 12 (25) Mid August  Spot treat any rough areas twice a day for 7 days  - Start 5-fluorouracil /calcipotriene cream twice a day for 7 days to affected areas including face . Prescription sent to [facility]. Patient advised they will receive an email to purchase the medication online and have it sent to their home. Patient provided with handout reviewing treatment course and side effects and advised to call or message us  on MyChart with any concerns.  Reviewed course of treatment and expected reaction.  Patient advised to expect inflammation and crusting and advised that erosions are possible.  Patient advised to be diligent with sun protection during and after treatment. Counseled to keep medication out of reach of children and pets.  ACTINIC DAMAGE WITH PRECANCEROUS ACTINIC KERATOSES Counseling for Topical Chemotherapy Management: Patient exhibits: - Severe, confluent actinic changes with pre-cancerous actinic keratoses that is secondary to cumulative UV radiation exposure over time - Condition that is severe; chronic, not at goal. - diffuse scaly erythematous macules and papules with underlying dyspigmentation -  Discussed Prescription Field Treatment topical Chemotherapy for Severe, Chronic Confluent Actinic Changes with Pre-Cancerous Actinic Keratoses Field treatment involves treatment of an entire area of skin that has confluent Actinic Changes (Sun/ Ultraviolet light damage) and PreCancerous Actinic Keratoses by method of  PhotoDynamic Therapy (PDT) and/or prescription Topical Chemotherapy agents such as 5-fluorouracil , 5-fluorouracil /calcipotriene, and/or imiquimod.  The purpose is to decrease the number of clinically evident and subclinical PreCancerous lesions to prevent progression to development of skin cancer by chemically destroying early precancer changes that may or may not be visible.  It has been shown to reduce the risk of developing skin cancer in the treated area. As a result of treatment, redness, scaling, crusting, and open sores may occur during treatment course. One or more than one of these methods may be used and may have to be used several times to control, suppress and eliminate the PreCancerous changes. Discussed treatment course, expected reaction, and possible side effects. - Recommend daily broad spectrum sunscreen SPF 30+ to sun-exposed areas, reapply every 2 hours as needed.  - Staying in the shade or wearing long sleeves, sun glasses (UVA+UVB protection) and wide brim hats (4-inch brim around the entire circumference of the hat) are also recommended. - Call for new or changing lesions.   Destruction of lesion - forehead x 3, left lateral brow x 1, left forehead x 2, left cheek x 3, right medial cheek x 1, right cheek sideburn area x 2, right upper lip x 1 b/l arms and hands x 12 (25) Complexity: simple   Destruction method: cryotherapy   Informed consent: discussed and consent obtained   Timeout:  patient name, date of birth, surgical site, and procedure verified Lesion destroyed using liquid nitrogen: Yes   Region frozen until ice ball extended beyond lesion: Yes   Outcome: patient tolerated procedure well with no complications   Post-procedure details: wound care instructions given   INFLAMED SEBORRHEIC KERATOSIS (5) right temple x 1, left temple x 1, right mandible x 2, right anterior neck x 1 (5) Symptomatic, irritating, patient would like treated. Destruction of lesion - right temple  x 1, left temple x 1, right mandible x 2, right anterior neck x 1 (5) Complexity: simple   Destruction method: cryotherapy   Informed consent: discussed and consent obtained   Timeout:  patient name, date of birth, surgical site, and procedure verified Lesion destroyed using liquid nitrogen: Yes   Region frozen until ice ball extended beyond lesion: Yes   Outcome: patient tolerated procedure well with no complications   Post-procedure details: wound care instructions given   SEBORRHEIC KERATOSIS   ACTINIC SKIN DAMAGE   CHEMOTHERAPY MANAGEMENT, ENCOUNTER FOR   COUNSELING AND COORDINATION OF CARE   MEDICATION MANAGEMENT    Return in about 7 months (around 06/07/2024) for tbse .  IRandee Busing, CMA, am acting as scribe for Celine Collard, MD.   Documentation: I have reviewed the above documentation for accuracy and completeness, and I agree with the above.  Celine Collard, MD

## 2023-11-06 NOTE — Patient Instructions (Addendum)
 In [facility] August  Use as a spot treatment to any rough areas at face twice daily for 7 days   - Start 5-fluorouracil /calcipotriene cream twice a day for 7 days to affected areas including face. Prescription sent to [facility]. Patient advised they will receive an email to purchase the medication online and have it sent to their home. Patient provided with handout reviewing treatment course and side effects and advised to call or message us  on MyChart with any concerns.  Reviewed course of treatment and expected reaction.  Patient advised to expect inflammation and crusting and advised that erosions are possible.  Patient advised to be diligent with sun protection during and after treatment. Counseled to keep medication out of reach of children and pets.  Instructions for [facility]  One or more of your medications was sent to the [facility] mail order [facility] pharmacy. You will receive an email from them and can purchase the medicine through that link. It will then be mailed to your home at the address you confirmed. If for any reason you do not receive an email from them, please check your spam folder. If you still do not find the email, please let us  know. [facility] phone number is [phone].     5-Fluorouracil /Calcipotriene Patient Education   Actinic keratoses are the dry, red scaly spots on the skin caused by sun damage. A portion of these spots can turn into skin cancer with time, and treating them can help prevent development of skin cancer.   Treatment of these spots requires removal of the defective skin cells. There are various ways to remove actinic keratoses, including freezing with liquid nitrogen, treatment with creams, or treatment with a blue light procedure in the office.   5-fluorouracil  cream is a topical cream used to treat actinic keratoses. It works by interfering with the growth of abnormal fast-growing skin  cells, such as actinic keratoses. These cells peel off and are replaced by healthy ones.   5-fluorouracil /calcipotriene is a combination of the 5-fluorouracil  cream with a vitamin D  analog cream called calcipotriene. The calcipotriene alone does not treat actinic keratoses. However, when it is combined with 5-fluorouracil , it helps the 5-fluorouracil  treat the actinic keratoses much faster so that the same results can be achieved with a much shorter treatment time.  INSTRUCTIONS FOR 5-FLUOROURACIL /CALCIPOTRIENE CREAM:   5-fluorouracil /calcipotriene cream typically only needs to be used for 4-7 days. A thin layer should be applied twice a day to the treatment areas recommended by your physician.   If your physician prescribed you separate tubes of 5-fluourouracil and calcipotriene, apply a thin layer of 5-fluorouracil  followed by a thin layer of calcipotriene.   Avoid contact with your eyes, nostrils, and mouth. Do not use 5-fluorouracil /calcipotriene cream on infected or open wounds.   You will develop redness, irritation and some crusting at areas where you have pre-cancer damage/actinic keratoses. IF YOU DEVELOP PAIN, BLEEDING, OR SIGNIFICANT CRUSTING, STOP THE TREATMENT EARLY - you have already gotten a good response and the actinic keratoses should clear up well.  Wash your hands after applying 5-fluorouracil  5% cream on your skin.   A moisturizer or sunscreen with a minimum SPF 30 should be applied each morning.   Once you have finished the treatment, you can apply a thin layer of Vaseline twice a day to irritated areas to soothe and calm the areas more quickly. If you experience significant discomfort, contact your physician.  For some patients it is necessary  to repeat the treatment for best results.  SIDE EFFECTS: When using 5-fluorouracil /calcipotriene cream, you may have mild irritation, such as redness, dryness, swelling, or a mild burning sensation. This usually resolves within 2  weeks. The more actinic keratoses you have, the more redness and inflammation you can expect during treatment. Eye irritation has been reported rarely. If this occurs, please let us  know.  If you have any trouble using this cream, please call the office. If you have any other questions about this information, please do not hesitate to ask me before you leave the office.    Actinic keratoses are precancerous spots that appear secondary to cumulative UV radiation exposure/sun exposure over time. They are chronic with expected duration over 1 year. A portion of actinic keratoses will progress to squamous cell carcinoma of the skin. It is not possible to reliably predict which spots will progress to skin cancer and so treatment is recommended to prevent development of skin cancer.  Recommend daily broad spectrum sunscreen SPF 30+ to sun-exposed areas, reapply every 2 hours as needed.  Recommend staying in the shade or wearing long sleeves, sun glasses (UVA+UVB protection) and wide brim hats (4-inch brim around the entire circumference of the hat). Call for new or changing lesions.   Cryotherapy Aftercare  Wash gently with soap and water  everyday.   Apply Vaseline and Band-Aid daily until healed.   Seborrheic Keratosis  What causes seborrheic keratoses? Seborrheic keratoses are harmless, common skin growths that first appear during adult life.  As time goes by, more growths appear.  Some people may develop a large number of them.  Seborrheic keratoses appear on both covered and uncovered body parts.  They are not caused by sunlight.  The tendency to develop seborrheic keratoses can be inherited.  They vary in color from skin-colored to gray, brown, or even black.  They can be either smooth or have a rough, warty surface.   Seborrheic keratoses are superficial and look as if they were stuck on the skin.  Under the microscope this type of keratosis looks like layers upon layers of skin.  That is why at  times the top layer may seem to fall off, but the rest of the growth remains and re-grows.    Treatment Seborrheic keratoses do not need to be treated, but can easily be removed in the office.  Seborrheic keratoses often cause symptoms when they rub on clothing or jewelry.  Lesions can be in the way of shaving.  If they become inflamed, they can cause itching, soreness, or burning.  Removal of a seborrheic keratosis can be accomplished by freezing, burning, or surgery. If any spot bleeds, scabs, or grows rapidly, please return to have it checked, as these can be an indication of a skin cancer.    Due to recent changes in healthcare laws, you may see results of your pathology and/or laboratory studies on MyChart before the doctors have had a chance to review them. We understand that in some cases there may be results that are confusing or concerning to you. Please understand that not all results are received at the same time and often the doctors may need to interpret multiple results in order to provide you with the best plan of care or course of treatment. Therefore, we ask that you please give us  2 business days to thoroughly review all your results before contacting the office for clarification. Should we see a critical lab result, you will be contacted sooner.  If You Need Anything After Your Visit  If you have any questions or concerns for your doctor, please call our main line at [phone] and press option 4 to reach your doctor's medical assistant. If no one answers, please leave a voicemail as directed and we will return your call as soon as possible. Messages left after 4 pm will be answered the following business day.   You may also send us  a message via MyChart. We typically respond to MyChart messages within 1-2 business days.  For prescription refills, please ask your pharmacy to contact our office. Our fax number is [phone].  If you have an urgent issue when the clinic is  closed that cannot wait until the next business day, you can page your doctor at the number below.    Please note that while we do our best to be available for urgent issues outside of office hours, we are not available 24/7.   If you have an urgent issue and are unable to reach us , you may choose to seek medical care at your doctor's office, retail clinic, urgent care center, or emergency room.  If you have a medical emergency, please immediately call 911 or go to the emergency department.  Pager Numbers  - Dr. Bary Likes: [phone]  - Dr. Annette Barters: [phone]  - Dr. Felipe Horton: [phone]   In the event of inclement weather, please call our main line at [phone] for an update on the status of any delays or closures.  Dermatology Medication Tips: Please keep the boxes that topical medications come in in order to help keep track of the instructions about where and how to use these. Pharmacies typically print the medication instructions only on the boxes and not directly on the medication tubes.   If your medication is too expensive, please contact our office at [phone] option 4 or send us  a message through MyChart.   We are unable to tell what your co-pay for medications will be in advance as this is different depending on your insurance coverage. However, we may be able to find a substitute medication at lower cost or fill out paperwork to get insurance to cover a needed medication.   If a prior authorization is required to get your medication covered by your insurance company, please allow us  1-2 business days to complete this process.  Drug prices often vary depending on where the prescription is filled and some pharmacies may offer cheaper prices.  The website www.goodrx.com contains coupons for medications through different pharmacies. The prices here do not account for what the cost may be with help from insurance (it may be cheaper with your insurance), but the website can  give you the price if you did not use any insurance.  - You can print the associated coupon and take it with your prescription to the pharmacy.  - You may also stop by our office during regular business hours and pick up a GoodRx coupon card.  - If you need your prescription sent electronically to a different pharmacy, notify our office through [facility] or by phone at [phone] option 4.     Si Usted Necesita Algo Despus de Su Visita  Tambin puede enviarnos un mensaje a travs de Clinical cytogeneticist. Por lo general respondemos a los mensajes de MyChart en el transcurso de 1 a 2 das hbiles.  Para renovar recetas, por favor pida a su farmacia que se ponga en contacto con nuestra oficina. Franz Jacks de fax es [place] [phone].  Si tiene un asunto urgente cuando la clnica est cerrada y que no puede esperar hasta el siguiente da hbil, puede llamar/localizar a su doctor(a) al nmero que aparece a continuacin.   Por favor, tenga en cuenta que aunque hacemos todo lo posible para estar disponibles para asuntos urgentes fuera del horario de [place], no estamos disponibles las 24 horas del da, los 7 [address]  [address] de la [place].   Si tiene un problema urgente y no puede comunicarse con nosotros, puede optar por buscar atencin mdica  en el consultorio de su doctor(a), en una clnica privada, en un centro de atencin urgente o en una sala de emergencias.  Si tiene Engineer, drilling, por favor llame inmediatamente al 911 o vaya a la sala de emergencias.  Nmeros de bper  - Dr. Bary Likes: [phone]  - Dra. Annette Barters: [phone]  - Dr. Felipe Horton: [phone]   En caso de inclemencias del tiempo, por favor llame a Lajuan Pila principal al [phone] para una actualizacin sobre el [place] de cualquier retraso o cierre.  Consejos para la medicacin en dermatologa: Por favor, guarde las cajas en las que vienen los medicamentos de uso tpico para ayudarle a seguir las instrucciones sobre  dnde y cmo usarlos. Las farmacias generalmente imprimen las instrucciones del medicamento slo en las cajas y no directamente en los tubos del [place].   Si su medicamento es muy caro, por favor, pngase en contacto con Bettyjane Brunet llamando al [phone] y presione la opcin 4 o envenos un mensaje a travs de Clinical cytogeneticist.   No podemos decirle cul ser su copago por los medicamentos por adelantado ya que esto es diferente dependiendo de la cobertura de su seguro. Sin embargo, es posible que podamos encontrar un medicamento sustituto a Audiological scientist un formulario para que el seguro cubra el medicamento que se considera necesario.   Si se requiere una autorizacin previa para que su compaa de seguros [place] su medicamento, por favor permtanos de 1 a 2 das hbiles para completar este proceso.  Los precios de los medicamentos varan con frecuencia dependiendo del Environmental consultant de dnde se surte la receta y alguna farmacias pueden ofrecer precios ms baratos.  El sitio web www.goodrx.com tiene cupones para medicamentos de Health and safety inspector. Los precios aqu no tienen en cuenta lo que podra costar con la ayuda del seguro (puede ser ms barato con su seguro), pero el sitio web puede darle el precio si no utiliz Tourist information centre manager.  - Puede imprimir el cupn correspondiente y llevarlo con su receta a la farmacia.  - Tambin puede pasar por nuestra oficina durante el horario de atencin regular y Education officer, museum una tarjeta de cupones de GoodRx.  - Si necesita que su receta se enve electrnicamente a una farmacia diferente, informe a nuestra oficina a travs de MyChart de [place] o por telfono llamando al [phone] y presione la opcin 4.

## 2023-11-08 DIAGNOSIS — Z955 Presence of coronary angioplasty implant and graft: Secondary | ICD-10-CM | POA: Diagnosis not present

## 2023-11-08 DIAGNOSIS — Z1331 Encounter for screening for depression: Secondary | ICD-10-CM | POA: Diagnosis not present

## 2023-11-08 DIAGNOSIS — I4819 Other persistent atrial fibrillation: Secondary | ICD-10-CM | POA: Diagnosis not present

## 2023-11-08 DIAGNOSIS — Z0000 Encounter for general adult medical examination without abnormal findings: Secondary | ICD-10-CM | POA: Diagnosis not present

## 2023-11-15 ENCOUNTER — Other Ambulatory Visit: Payer: Self-pay | Admitting: Family Medicine

## 2023-11-15 DIAGNOSIS — M810 Age-related osteoporosis without current pathological fracture: Secondary | ICD-10-CM | POA: Diagnosis not present

## 2023-11-15 DIAGNOSIS — E782 Mixed hyperlipidemia: Secondary | ICD-10-CM

## 2023-11-29 ENCOUNTER — Encounter: Payer: Self-pay | Admitting: Surgery

## 2023-11-29 ENCOUNTER — Ambulatory Visit: Admitting: Surgery

## 2023-11-29 VITALS — BP 167/74 | HR 78 | Ht 63.0 in | Wt 121.2 lb

## 2023-11-29 DIAGNOSIS — K4090 Unilateral inguinal hernia, without obstruction or gangrene, not specified as recurrent: Secondary | ICD-10-CM

## 2023-11-29 DIAGNOSIS — Z8719 Personal history of other diseases of the digestive system: Secondary | ICD-10-CM

## 2023-11-29 DIAGNOSIS — R935 Abnormal findings on diagnostic imaging of other abdominal regions, including retroperitoneum: Secondary | ICD-10-CM

## 2023-11-29 NOTE — Progress Notes (Signed)
 Patient ID: Dawn Bruce, female   DOB: 1945-06-01, 78 y.o.   MRN: [id number]  Chief Complaint: Right lower quadrant bulge  History of Present Illness Dawn Bruce is a 78 y.o. female with prior history of conservatively managed appendiceal abscess with percutaneous drainage and antibiotics.  She has done remarkably well from this, and in the interval has gone through a significant amount of vascular intervention.  Even time in ICU and near loss of life perceived.  She now presents with a rather asymptomatic bulge in the right lower quadrant/high inguinal area.  It goes away when she lays down, presents when standing.  She denies pain or tenderness.  Had no bowel habit changes.  She also has a wound healing in that right groin from her vascular intervention, along with the retroperitoneal hematoma.  She has also been on Eliquis  and Plavix  since November.  She currently has need to establish care with cardiology.  She currently has a consultation with GI in mid August for colonoscopy.  She underwent recent CT scan, and the report is noted below.  We personally reviewed these images together and found the bulge is most likely eventration and not a true hernia.  However concerns around the appendix area, warrant further evaluation, and I concur that colonoscopy would be indicated to identify the degree of surgery necessary.  Past Medical History Past Medical History:  Diagnosis Date   Actinic keratosis    Chronic pain    GERD (gastroesophageal reflux disease) ?   Taking Pepcid    History of basal cell carcinoma (BCC) 05/02/2021   right upper back paraspinal   History of SCC (squamous cell carcinoma) of skin 09/10/2019   left dorsum wrist ED&C done 10/28/19   History of SCC (squamous cell carcinoma) of skin 03/04/2020   right dorsum hand   History of squamous cell carcinoma in situ (SCCIS) 03/04/2020   left dorsum wrist  ED&C   History of squamous cell carcinoma in situ (SCCIS) 03/04/2020   right  medial infraorbital  ED&C 04/28/2020   History of squamous cell carcinoma in situ (SCCIS) 10/28/2019   right dorsum hand proximal lateral   History of squamous cell carcinoma in situ (SCCIS) 10/28/2019   right dorsum hand proximal medial   Hyperlipidemia    Hypertension    NSTEMI (non-ST elevated myocardial infarction) (HCC) 04/12/2023   Renal disorder    Squamous cell carcinoma in situ 10/31/2021   left distal  tricep, EDC   Squamous cell carcinoma of skin 08/28/2022   Right volar forearm - EDC      Past Surgical History:  Procedure Laterality Date   ABDOMINAL HYSTERECTOMY     APPENDECTOMY  April 2023   Burst appendix   APPLICATION OF WOUND VAC Right 04/25/2023   Procedure: APPLICATION OF WOUND VAC;  Surgeon: Jama Cordella MATSU, MD;  Location: [facility];  Service: Vascular;  Laterality: Right;   APPLICATION OF WOUND VAC Right 04/27/2023   Procedure: WOUND VAC EXCHANGE;  Surgeon: Jama Cordella MATSU, MD;  Location: [facility];  Service: Vascular;  Laterality: Right;   APPLICATION OF WOUND VAC Right 05/01/2023   Procedure: APPLICATION OF WOUND VAC;  Surgeon: Jama Cordella MATSU, MD;  Location: [facility];  Service: Vascular;  Laterality: Right;   AUGMENTATION MAMMAPLASTY Bilateral    25-30 years ago   CARDIOVERSION N/A 12/01/2020   Procedure: CARDIOVERSION;  Surgeon: Darliss Rogue, MD;  Location: [facility];  Service: Cardiovascular;  Laterality: N/A;   CAROTID ARTERY ANGIOPLASTY Right 1995(approximate)  CAROTID ENDARTERECTOMY  2001   CORONARY STENT INTERVENTION N/A 04/13/2023   Procedure: CORONARY STENT INTERVENTION;  Surgeon: Ammon Blunt, MD;  Location: [facility];  Service: Cardiovascular;  Laterality: N/A;   COSMETIC SURGERY     ELBOW SURGERY Left    ENDARTERECTOMY FEMORAL Right 04/13/2023   Procedure: Right groin exploration with repair of right external iliac artery and right circumflex artery;  Surgeon: Tisa Curry LABOR, MD;  Location: [facility];  Service:  Vascular;  Laterality: Right;   FRACTURE SURGERY  2016   Arm   INCISION AND DRAINAGE OF WOUND Right 04/25/2023   Procedure: IRRIGATION AND DEBRIDEMENT WOUND;  Surgeon: Jama Cordella MATSU, MD;  Location: [facility];  Service: Vascular;  Laterality: Right;   INCISION AND DRAINAGE OF WOUND Right 04/27/2023   Procedure: IRRIGATION AND DEBRIDEMENT WOUND;  Surgeon: Jama Cordella MATSU, MD;  Location: [facility];  Service: Vascular;  Laterality: Right;   INCISION AND DRAINAGE OF WOUND Right 05/01/2023   Procedure: IRRIGATION AND DEBRIDEMENT WOUND;  Surgeon: Jama Cordella MATSU, MD;  Location: [facility];  Service: Vascular;  Laterality: Right;   LEFT HEART CATH AND CORONARY ANGIOGRAPHY N/A 04/13/2023   Procedure: LEFT HEART CATH AND CORONARY ANGIOGRAPHY;  Surgeon: Ammon Blunt, MD;  Location: [facility];  Service: Cardiovascular;  Laterality: N/A;   THORACENTESIS N/A 12/27/2020   Procedure: MILANA;  Surgeon: Brenna Adine CROME, DO;  Location: [facility];  Service: Pulmonary;  Laterality: N/A;   TONSILLECTOMY     TUBAL LIGATION  ?    No Known Allergies  Current Outpatient Medications  Medication Sig Dispense Refill   apixaban  (ELIQUIS ) 5 MG TABS tablet Take 5 mg by mouth 2 (two) times daily.     atorvastatin  (LIPITOR ) 40 MG tablet Take 1 tablet (40 mg total) by mouth daily. 90 tablet 1   carvedilol  (COREG ) 12.5 MG tablet Take 0.5 tablets (6.25 mg total) by mouth 2 (two) times daily with a meal.     clopidogrel  (PLAVIX ) 75 MG tablet Take 1 tablet (75 mg total) by mouth daily. 90 tablet 1   empagliflozin  (JARDIANCE ) 10 MG TABS tablet Take 10 mg by mouth daily.     ezetimibe  (ZETIA ) 10 MG tablet TAKE 1 TABLET(10 MG) BY MOUTH DAILY 90 tablet 0   fentaNYL  (DURAGESIC ) 75 MCG/HR Place 1 patch onto the skin every 3 (three) days. 10 patch 0   pregabalin  (LYRICA ) 150 MG capsule Take 1 capsule (150 mg total) by mouth 3 (three) times daily. 270 capsule 1   traZODone  (DESYREL ) 50 MG tablet  Take 50 mg by mouth at bedtime as needed for sleep.     No current facility-administered medications for this visit.    Family History Family History  Problem Relation Age of Onset   Cancer Mother    Cancer Father    Cancer Sister    Breast cancer Neg Hx       Social History Social History   Tobacco Use   Smoking status: Former    Current packs/day: 0.00    Average packs/day: 0.3 packs/day for 15.0 years (3.8 ttl pk-yrs)    Types: Cigarettes    Start date: 36    Quit date: 1990    Years since quitting: 35.5    Passive exposure: Past   Smokeless tobacco: Never   Tobacco comments:    quit around 1990-1995 (?)  Vaping Use   Vaping status: Never Used  Substance Use Topics   Alcohol  use: Yes  Alcohol /week: 14.0 standard drinks of alcohol     Types: 14 Standard drinks or equivalent per week    Comment: 2 scotch shots   Drug use: Never        Review of Systems  All other systems reviewed and are negative.    Physical Exam Blood pressure (!) 167/74, pulse 78, height 5' 3 (1.6 m), weight 121 lb 3.2 oz (55 kg), SpO2 97%. Last Weight  Most recent update: 11/29/2023 10:12 AM    Weight  55 kg (121 lb 3.2 oz)              CONSTITUTIONAL: Well developed, and nourished, appropriately responsive and aware without distress.   EYES: Sclera non-icteric.   EARS, NOSE, MOUTH AND THROAT:  The oropharynx is clear. Oral mucosa is pink and moist.     Hearing is intact to voice.  NECK: Trachea is midline, and there is no jugular venous distension.  LYMPH NODES:  Lymph nodes in the neck are not appreciated. RESPIRATORY:  Lungs are clear, and breath sounds are equal bilaterally.  Normal respiratory effort without pathologic use of accessory muscles. CARDIOVASCULAR:   Well perfused.  GI: The abdomen is  soft, nontender, and nondistended. There were no palpable masses.  I did not appreciate hepatosplenomegaly.  GU: Caryl Lyn present as chaperone, there is a rather widemouth  bulge hide in the right groin, consistent with an abdominal wall eventration or direct inguinal hernia.  There is a vertical scar in the right groin just inferior and medial to this bulge.   MUSCULOSKELETAL:  Symmetrical muscle tone appreciated in all four extremities.    SKIN: Skin turgor is normal. No pathologic skin lesions appreciated.  NEUROLOGIC:  Motor and sensation appear grossly normal.  Cranial nerves are grossly without defect. PSYCH:  Alert and oriented to person, place and time. Affect is appropriate for situation.  Data Reviewed I have personally reviewed what is currently available of the patient's imaging, recent labs and medical records.   Labs:     Latest Ref Rng & Units 10/18/2023    3:56 AM 10/17/2023    3:08 AM 10/16/2023    1:33 PM  CBC  WBC 4.0 - 10.5 K/uL 4.9  7.4  8.9   Hemoglobin 12.0 - 15.0 g/dL 88.2  88.0  87.9   Hematocrit 36.0 - 46.0 % 37.0  36.9  39.3   Platelets 150 - 400 K/uL 157  138  149       Latest Ref Rng & Units 10/18/2023    3:56 AM 10/17/2023    3:08 AM 10/16/2023    1:33 PM  CMP  Glucose 70 - 99 mg/dL 898  98  93   BUN 8 - 23 mg/dL 23  21  22    Creatinine 0.44 - 1.00 mg/dL 8.76  8.76  8.74   Sodium 135 - 145 mmol/L 144  142  138   Potassium 3.5 - 5.1 mmol/L 3.9  3.8  4.7   Chloride 98 - 111 mmol/L 111  112  103   CO2 22 - 32 mmol/L 25  22  22    Calcium  8.9 - 10.3 mg/dL 9.0  8.7  9.5   Total Protein 6.5 - 8.1 g/dL  5.6    Total Bilirubin 0.0 - 1.2 mg/dL  0.6    Alkaline Phos 38 - 126 U/L  63    AST 15 - 41 U/L  18    ALT 0 - 44 U/L  16  Imaging: Radiological images reviewed:  CLINICAL DATA:  Right lower quadrant abdominal pain.   EXAM: CT ABDOMEN AND PELVIS WITH CONTRAST   TECHNIQUE: Multidetector CT imaging of the abdomen and pelvis was performed using the standard protocol following bolus administration of intravenous contrast.   RADIATION DOSE REDUCTION: This exam was performed according to the departmental  dose-optimization program which includes automated exposure control, adjustment of the mA and/or kV according to patient size and/or use of iterative reconstruction technique.   CONTRAST:  75mL OMNIPAQUE  IOHEXOL  300 MG/ML  SOLN   COMPARISON:  April 13, 2023.   FINDINGS: Lower chest: Large left pleural effusion is noted with small right pleural effusion, with adjacent atelectasis of the lower lobes.   Hepatobiliary: No focal liver abnormality is seen. No gallstones, gallbladder wall thickening, or biliary dilatation.   Pancreas: Unremarkable. No pancreatic ductal dilatation or surrounding inflammatory changes.   Spleen: Normal in size without focal abnormality.   Adrenals/Urinary Tract: Adrenal glands and kidneys appear normal. No hydronephrosis or renal obstruction is noted. Urinary bladder is displaced to the left by moderate size right-sided pelvic hematoma which appears to be slightly enlarged compared to prior exam.   Stomach/Bowel: The stomach is unremarkable. There is no evidence of bowel obstruction or inflammation. Status post appendectomy.   Vascular/Lymphatic: Aortic atherosclerosis. No enlarged abdominal or pelvic lymph nodes.   Reproductive: Status post hysterectomy. No adnexal masses.   Other: As noted above, moderate size right-sided retroperitoneal hematoma is noted which extends into right pelvic region, where it is mildly enlarged. Subcutaneous gas is noted in the right lower quadrant suggesting postsurgical change.   Musculoskeletal: No acute or significant osseous findings.   IMPRESSION: Moderate sized right retroperitoneal hematoma is noted which extends into right pelvic region and displaces urinary bladder to the left. The pelvic component appears to be mildly enlarged compared to prior exam.   Bilateral pleural effusions are noted, left greater than right, with associated atelectasis of the lower lobes.     Electronically Signed   By: Lynwood Landy Raddle M.D.   On: 04/22/2023 17:45  CLINICAL DATA:  R inguinal pain, right upper thigh redness   EXAM: CT ABDOMEN AND PELVIS WITH CONTRAST   TECHNIQUE: Multidetector CT imaging of the abdomen and pelvis was performed using the standard protocol following bolus administration of intravenous contrast.   RADIATION DOSE REDUCTION: This exam was performed according to the departmental dose-optimization program which includes automated exposure control, adjustment of the mA and/or kV according to patient size and/or use of iterative reconstruction technique.   CONTRAST:  OMNIPAQUE  IOHEXOL  300 MG/ML  SOLN   COMPARISON:  April 22, 2023   FINDINGS: Lower chest: Small left pleural effusion with left basilar atelectasis.No cardiomegaly.   Hepatobiliary: No mass.No radiopaque stones or wall thickening of the gallbladder. No intrahepatic or extrahepatic biliary ductal dilation. The portal veins are patent.   Pancreas: No mass or main ductal dilation. No peripancreatic inflammation or fluid collection.   Spleen: Normal size. No mass.   Adrenals/Urinary Tract: No adrenal masses. Multifocal cortical scarring in both kidneys. No mass. No nephrolithiasis or hydronephrosis. Partially distended urinary bladder without visualized abnormality.   Stomach/Bowel: The stomach contains ingested material without focal abnormality. no small bowel wall thickening or inflammation. No small bowel obstruction.The appendix is not visualized, likely surgically absent. Along the posterior aspect of the cecum, there is a fluid-filled structure with mucosal hyperenhancement, measuring 2.2 cm (axial 46), with adjacent stranding. Total colonic diverticulosis is also  noted scattered throughout the colon.   Vascular/Lymphatic: No aortic aneurysm. Diffuse aortoiliac atherosclerosis. No intraabdominal or pelvic lymphadenopathy.   Reproductive: Hysterectomy. No concerning adnexal mass.No  free pelvic fluid.   Other: No pneumoperitoneum or ascites   Musculoskeletal: No acute fracture or destructive lesion. Partially visualized breast implants bilaterally. Multilevel degenerative disc disease of the spine. Skin thickening and soft tissue irregularity in the right inguinal region, overall measuring 3.9 x 3.3 x 5 (APxTRxCC). No well-formed abscess or drainable fluid collection.   IMPRESSION: 1. Skin thickening and soft tissue irregularity in the right inguinal region, overall measuring 3.9 x 3.3 x 5 (APxTRxCC). This could represent combination of postsurgical change with cellulitis. No well-formed abscess or drainable fluid collection. 2. Along the posterior aspect of the cecum, there is a fluid-filled structure with mucosal hyperenhancement, measuring 2.2 cm (axial 46), with adjacent stranding. While the adjacent stranding could be chronic changes related to the remote retroperitoneal hematoma, this is more worrisome for an acute process, such as acute diverticulitis. Alternatively, in the setting of a prior appendectomy, fluid trapped within the appendiceal stump, due to stump appendicitis could also have this appearance. Nonemergent colonoscopy recommended to exclude underlying neoplasm. 3. Small left pleural effusion.     Electronically Signed   By: Rogelia Myers M.D.   On: 10/16/2023 17:58 Within last 24 hrs: No results found.  Assessment    Soft readily reducible right lower quadrant/inguinal hernia.   Persisting presence of appendiceal/cecal process following conservative management of complicated appendicitis. Patient Active Problem List   Diagnosis Date Noted   Hypertensive urgency 10/17/2023   Paroxysmal atrial fibrillation (HCC) 10/17/2023   Abnormal CT of the abdomen 10/17/2023   Cellulitis of groin, right 10/16/2023   Right inguinal hernia 09/25/2023   S/P thoracentesis 05/07/2023   Thrombocytopenia (HCC) 05/06/2023   Acute kidney injury  superimposed on stage 3a chronic kidney disease (HCC) 05/02/2023   Metabolic acidosis 05/02/2023   Overweight (BMI 25.0-29.9) 05/02/2023   HFrEF (heart failure with reduced ejection fraction) (HCC) 04/23/2023   Infected wound 04/23/2023   CAD (coronary artery disease) 04/22/2023   Chronic kidney disease, stage 3a (HCC) 04/22/2023   Peritoneal hematoma 04/22/2023   Pleural effusion 04/22/2023   Abnormal LFTs 04/22/2023   Hypokalemia 04/22/2023   Atrial fibrillation, chronic (HCC) 04/22/2023   Diarrhea 04/22/2023   Tobacco abuse 04/13/2023   NSTEMI (non-ST elevated myocardial infarction) (HCC) 04/12/2023   Chest pain 04/12/2023   OAB (overactive bladder) 04/27/2022   Chronic diastolic heart failure (HCC) 09/27/2021   Insomnia 09/06/2021   Perforated appendicitis 08/31/2021   Carotid artery occlusion 02/07/2021   Cerebral atherosclerosis 02/07/2021   Hypertensive heart disease without congestive heart failure 02/07/2021   Occlusion and stenosis of bilateral carotid arteries 02/07/2021   Vitamin D  deficiency, unspecified 02/07/2021   Chronic anticoagulation 01/27/2021   Senile purpura (HCC) 12/03/2020   Prediabetes 10/08/2019   Stenosis of right carotid artery 10/03/2018   HLD (hyperlipidemia) 10/03/2018   Essential hypertension 10/02/2018   CKD (chronic kidney disease) stage 3, GFR 30-59 ml/min (HCC) 06/17/2018   Chronic forearm pain (Primary Area of Pain) (Left) 06/17/2018   Neurogenic pain 06/17/2018   Chronic pain syndrome 06/06/2018   Long term current use of opiate analgesic 06/06/2018    Plan    Attenuation versus hernia was discussed.  Prioritization of this patient's medical concerns,  #1 follow-up with cardiology.  #2 colonoscopy to ensure no other processes involving the right colon/cecum. Patient and her husband would like to  expedite this as possible.  #3 would then follow in evaluating appendiceal/cecal process to ensure benignity. Last of all would be  pursuing repair/plication of this eventrated area of her right lower quadrant abdominal wall.  And this may be evaluated during any appendectomy or right colonic/cecal excision.  At present, minimally symptomatic, explained the role of observation and optimal timing to consider elective repair.     I personally spent a total of 60 minutes in the care of the patient today including preparing to see the patient, performing a medically appropriate exam/evaluation, counseling and educating, placing orders, referring and communicating with other health care professionals, documenting clinical information in the EHR, independently interpreting results, communicating results, and coordinating care.   These notes generated with voice recognition software. I apologize for typographical errors.  Honor Leghorn M.D., FACS 11/29/2023, 12:06 PM

## 2023-11-29 NOTE — Patient Instructions (Addendum)
 Do call [facility] Cardiology, Dr Ammon office to get set up to see him. [phone]    We will have you follow up here after you see Gastroenterology.   We will see if we can get you seen sooner.

## 2023-12-12 ENCOUNTER — Other Ambulatory Visit: Payer: Self-pay | Admitting: Family Medicine

## 2023-12-12 NOTE — Telephone Encounter (Unsigned)
 Copied from CRM [id number]. Topic: Clinical - Medication Refill >> Dec 12, 2023  3:47 PM Essie A wrote: Medication: atorvastatin  (LIPITOR ) 40 MG tablet  Has the patient contacted their pharmacy? Yes (Agent: If no, request that the patient contact the pharmacy for the refill. If patient does not wish to contact the pharmacy document the reason why and proceed with request.) (Agent: If yes, when and what did the pharmacy advise?)  This is the patient's preferred pharmacy:  [facility] [phone]  [address] [place], [place] [address] Phone: [phone] Fax: [phone]  Is this the correct pharmacy for this prescription? Yes If no, delete pharmacy and type the correct one.   Has the prescription been filled recently? Yes  Is the patient out of the medication? No  Has the patient been seen for an appointment in the last year OR does the patient have an upcoming appointment? Yes  Can we respond through MyChart? No  Agent: Please be advised that Rx refills may take up to 3 business days. We ask that you follow-up with your pharmacy.

## 2023-12-14 MED ORDER — ATORVASTATIN CALCIUM 40 MG PO TABS: 40.0000 mg | ORAL_TABLET | Freq: Every day | ORAL | 1 refills | Status: AC

## 2023-12-14 NOTE — Telephone Encounter (Signed)
 Requested medication (s) are due for refill today: Yes  Requested medication (s) are on the active medication list: Yes  Last refill:  06/08/23  Future visit scheduled:   Notes to clinic:  Prescription expired.    Requested Prescriptions  Pending Prescriptions Disp Refills   atorvastatin  (LIPITOR ) 40 MG tablet 90 tablet 1    Sig: Take 1 tablet (40 mg total) by mouth daily.     Cardiovascular:  Antilipid - Statins Failed - 12/14/2023 10:34 AM      Failed - Lipid Panel in normal range within the last 12 months    Cholesterol, Total  Date Value Ref Range Status  06/01/2022 156 100 - 199 mg/dL Final   Cholesterol  Date Value Ref Range Status  04/12/2023 151 0 - 200 mg/dL Final   LDL Chol Calc (NIH)  Date Value Ref Range Status  06/01/2022 73 0 - 99 mg/dL Final   LDL Cholesterol  Date Value Ref Range Status  04/12/2023 67 0 - 99 mg/dL Final    Comment:           Total Cholesterol/HDL:CHD Risk Coronary Heart Disease Risk Table                     Men   Women  1/2 Average Risk   3.4   3.3  Average Risk       5.0   4.4  2 X Average Risk   9.6   7.1  3 X Average Risk  23.4   11.0        Use the calculated Patient Ratio above and the CHD Risk Table to determine the patient's CHD Risk.        ATP III CLASSIFICATION (LDL):  <100     mg/dL   Optimal  899-870  mg/dL   Near or Above                    Optimal  130-159  mg/dL   Borderline  839-810  mg/dL   High  >809     mg/dL   Very High Performed at [facility], [address] [place], [place] [address]    HDL  Date Value Ref Range Status  04/12/2023 61 >40 mg/dL Final  98/88/7975 73 >60 mg/dL Final   Triglycerides  Date Value Ref Range Status  04/15/2023 75 <150 mg/dL Final    Comment:    Performed at [facility], [address] [place], [place] [address]         Passed - Patient is not pregnant      Passed - Valid encounter within last 12 months    Recent Outpatient Visits            1 month ago Abnormal CT of the abdomen   [facility] [place], Jon HERO, MD   3 months ago Neurogenic pain   [facility] [place], Jon HERO, MD       Future Appointments             In 5 months Hester Alm BROCKS, MD [facility]

## 2024-01-08 ENCOUNTER — Encounter: Admitting: Family Medicine

## 2024-01-15 ENCOUNTER — Ambulatory Visit (INDEPENDENT_AMBULATORY_CARE_PROVIDER_SITE_OTHER): Admitting: Surgery

## 2024-01-15 ENCOUNTER — Encounter: Payer: Self-pay | Admitting: Surgery

## 2024-01-15 VITALS — BP 195/83 | HR 73 | Temp 97.6°F | Ht 63.0 in | Wt 119.2 lb

## 2024-01-15 DIAGNOSIS — K432 Incisional hernia without obstruction or gangrene: Secondary | ICD-10-CM | POA: Diagnosis not present

## 2024-01-15 DIAGNOSIS — R1031 Right lower quadrant pain: Secondary | ICD-10-CM | POA: Insufficient documentation

## 2024-01-15 DIAGNOSIS — K3532 Acute appendicitis with perforation and localized peritonitis, without abscess: Secondary | ICD-10-CM

## 2024-01-15 NOTE — Patient Instructions (Signed)
 We have scheduled you for a CT Scan of your Abdomen and Pelvis with contrast. [facility] at 3pm. Please arrive at 1pm to start drinking the contrast. If you need to reschedule your Scan, you may do so by calling [phone]. Please let us  know if you reschedule your scan as we have to get authorization from your insurance for this.

## 2024-01-15 NOTE — Progress Notes (Signed)
 Patient ID: Dawn Bruce, female   DOB: [birth year], 78 y.o.   MRN: [id number]  Chief Complaint: Right lower quadrant bulge  History of Present Illness Dawn Bruce is a 78 y.o. female with prior history of conservatively managed appendiceal abscess with percutaneous drainage and antibiotics.  She has done remarkably well from this, and in the interval has gone through a significant amount of vascular intervention.  Even time in ICU and near loss of life perceived.  She now presents with a significant, but somewhat asymptomatic bulge in the right lower quadrant/high inguinal area.  It goes away when she lays down, presents when standing.  She denies pain or tenderness.  Had no bowel habit changes.  She also has a wound healing in that right groin from her vascular intervention, along with the retroperitoneal hematoma.  She has also been on Eliquis  and Plavix  since November.  She has since seen her cardiologist in late July.  She currently has a colonoscopy currently scheduled for March 18, 2024.  It was delayed due to some wedding plans that were critical for her not to miss.   However concerns around the appendix area, warrant further evaluation, and I concur that colonoscopy would be indicated to identify the degree of surgery necessary.  Since some much time is passed up believe would be worthwhile to repeat a CT scan as well.  Past Medical History Past Medical History:  Diagnosis Date   Actinic keratosis    Chronic pain    GERD (gastroesophageal reflux disease) ?   Taking Pepcid    History of basal cell carcinoma (BCC) 05/02/2021   right upper back paraspinal   History of SCC (squamous cell carcinoma) of skin 09/10/2019   left dorsum wrist ED&C done 10/28/19   History of SCC (squamous cell carcinoma) of skin 03/04/2020   right dorsum hand   History of squamous cell carcinoma in situ (SCCIS) 03/04/2020   left dorsum wrist  ED&C   History of squamous cell carcinoma in situ (SCCIS) 03/04/2020    right medial infraorbital  ED&C 04/28/2020   History of squamous cell carcinoma in situ (SCCIS) 10/28/2019   right dorsum hand proximal lateral   History of squamous cell carcinoma in situ (SCCIS) 10/28/2019   right dorsum hand proximal medial   Hyperlipidemia    Hypertension    NSTEMI (non-ST elevated myocardial infarction) (HCC) 04/12/2023   Renal disorder    Squamous cell carcinoma in situ 10/31/2021   left distal  tricep, EDC   Squamous cell carcinoma of skin 08/28/2022   Right volar forearm - EDC      Past Surgical History:  Procedure Laterality Date   ABDOMINAL HYSTERECTOMY     APPENDECTOMY  April 2023   Burst appendix   APPLICATION OF WOUND VAC Right 04/25/2023   Procedure: APPLICATION OF WOUND VAC;  Surgeon: Jama Cordella MATSU, MD;  Location: [facility];  Service: Vascular;  Laterality: Right;   APPLICATION OF WOUND VAC Right 04/27/2023   Procedure: WOUND VAC EXCHANGE;  Surgeon: Jama Cordella MATSU, MD;  Location: [facility];  Service: Vascular;  Laterality: Right;   APPLICATION OF WOUND VAC Right 05/01/2023   Procedure: APPLICATION OF WOUND VAC;  Surgeon: Jama Cordella MATSU, MD;  Location: [facility];  Service: Vascular;  Laterality: Right;   AUGMENTATION MAMMAPLASTY Bilateral    25-30 years ago   CARDIOVERSION N/A 12/01/2020   Procedure: CARDIOVERSION;  Surgeon: Darliss Rogue, MD;  Location: [facility];  Service: Cardiovascular;  Laterality: N/A;   CAROTID  ARTERY ANGIOPLASTY Right 1995(approximate)   CAROTID ENDARTERECTOMY  2001   CORONARY STENT INTERVENTION N/A 04/13/2023   Procedure: CORONARY STENT INTERVENTION;  Surgeon: Ammon Blunt, MD;  Location: [facility];  Service: Cardiovascular;  Laterality: N/A;   COSMETIC SURGERY     ELBOW SURGERY Left    ENDARTERECTOMY FEMORAL Right 04/13/2023   Procedure: Right groin exploration with repair of right external iliac artery and right circumflex artery;  Surgeon: Tisa Curry LABOR, MD;  Location: [facility];  Service:  Vascular;  Laterality: Right;   FRACTURE SURGERY  2016   Arm   INCISION AND DRAINAGE OF WOUND Right 04/25/2023   Procedure: IRRIGATION AND DEBRIDEMENT WOUND;  Surgeon: Jama Cordella MATSU, MD;  Location: [facility];  Service: Vascular;  Laterality: Right;   INCISION AND DRAINAGE OF WOUND Right 04/27/2023   Procedure: IRRIGATION AND DEBRIDEMENT WOUND;  Surgeon: Jama Cordella MATSU, MD;  Location: [facility];  Service: Vascular;  Laterality: Right;   INCISION AND DRAINAGE OF WOUND Right 05/01/2023   Procedure: IRRIGATION AND DEBRIDEMENT WOUND;  Surgeon: Jama Cordella MATSU, MD;  Location: [facility];  Service: Vascular;  Laterality: Right;   LEFT HEART CATH AND CORONARY ANGIOGRAPHY N/A 04/13/2023   Procedure: LEFT HEART CATH AND CORONARY ANGIOGRAPHY;  Surgeon: Ammon Blunt, MD;  Location: [facility];  Service: Cardiovascular;  Laterality: N/A;   THORACENTESIS N/A 12/27/2020   Procedure: MILANA;  Surgeon: Brenna Adine CROME, DO;  Location: [facility];  Service: Pulmonary;  Laterality: N/A;   TONSILLECTOMY     TUBAL LIGATION  ?    No Known Allergies  Current Outpatient Medications  Medication Sig Dispense Refill   carvedilol  (COREG ) 12.5 MG tablet Take 0.5 tablets (6.25 mg total) by mouth 2 (two) times daily with a meal. (Patient taking differently: Take 12.5 mg by mouth 2 (two) times daily with a meal.)     apixaban  (ELIQUIS ) 5 MG TABS tablet Take 5 mg by mouth 2 (two) times daily.     atorvastatin  (LIPITOR ) 40 MG tablet Take 1 tablet (40 mg total) by mouth daily. 90 tablet 1   clopidogrel  (PLAVIX ) 75 MG tablet Take 1 tablet (75 mg total) by mouth daily. 90 tablet 1   empagliflozin  (JARDIANCE ) 10 MG TABS tablet Take 10 mg by mouth daily.     ezetimibe  (ZETIA ) 10 MG tablet TAKE 1 TABLET(10 MG) BY MOUTH DAILY 90 tablet 0   fentaNYL  (DURAGESIC ) 75 MCG/HR Place 1 patch onto the skin every 3 (three) days. 10 patch 0   pregabalin  (LYRICA ) 150 MG capsule Take 1 capsule (150 mg total)  by mouth 3 (three) times daily. 270 capsule 1   traZODone  (DESYREL ) 50 MG tablet Take 50 mg by mouth at bedtime as needed for sleep.     No current facility-administered medications for this visit.    Family History Family History  Problem Relation Age of Onset   Cancer Mother    Cancer Father    Cancer Sister    Breast cancer Neg Hx       Social History Social History   Tobacco Use   Smoking status: Former    Current packs/day: 0.00    Average packs/day: 0.3 packs/day for 15.0 years (3.8 ttl pk-yrs)    Types: Cigarettes    Start date: 1    Quit date: 1990    Years since quitting: 35.6    Passive exposure: Past   Smokeless tobacco: Never   Tobacco comments:    quit around  8009-8004 (?)  Vaping Use   Vaping status: Never Used  Substance Use Topics   Alcohol  use: Yes    Alcohol /week: 14.0 standard drinks of alcohol     Types: 14 Standard drinks or equivalent per week    Comment: 2 scotch shots   Drug use: Never        Review of Systems  All other systems reviewed and are negative.    Physical Exam Blood pressure (!) 195/83, pulse 73, temperature 97.6 F (36.4 C), temperature source Oral, height 5' 3 (1.6 m), weight 119 lb 3.2 oz (54.1 kg), SpO2 98%. Last Weight  Most recent update: 01/15/2024 11:07 AM    Weight  54.1 kg (119 lb 3.2 oz)              CONSTITUTIONAL: Well developed, and nourished, appropriately responsive and aware without distress.   EYES: Sclera non-icteric.   EARS, NOSE, MOUTH AND THROAT:  The oropharynx is clear. Oral mucosa is pink and moist.     Hearing is intact to voice.  NECK: Trachea is midline, and there is no jugular venous distension.  LYMPH NODES:  Lymph nodes in the neck are not appreciated. RESPIRATORY:  Lungs are clear, and breath sounds are equal bilaterally.  Normal respiratory effort without pathologic use of accessory muscles. CARDIOVASCULAR:   Well perfused.  GI: The abdomen is  soft, nontender, and nondistended.  There were no palpable masses.  I did not appreciate hepatosplenomegaly.  GU: Caryl Lyn present as chaperone, there is a rather widemouth bulge high in the right groin, consistent with a RLQ abdominal wall eventration or incisional hernia.  There is a complex vertical scar in the right groin just inferior to this bulge.  It remains widemouth and readily reducible, consistent with a loss of muscle tone/eventration rather than a true fascial defect.  Regardless I could understand her concern. MUSCULOSKELETAL:  Symmetrical muscle tone appreciated in all four extremities.    SKIN: Skin turgor is normal. No pathologic skin lesions appreciated.  NEUROLOGIC:  Motor and sensation appear grossly normal.  Cranial nerves are grossly without defect. PSYCH:  Alert and oriented to person, place and time. Affect is appropriate for situation.  Data Reviewed I have personally reviewed what is currently available of the patient's imaging, recent labs and medical records.   Labs:     Latest Ref Rng & Units 10/18/2023    3:56 AM 10/17/2023    3:08 AM 10/16/2023    1:33 PM  CBC  WBC 4.0 - 10.5 K/uL 4.9  7.4  8.9   Hemoglobin 12.0 - 15.0 g/dL 88.2  88.0  87.9   Hematocrit 36.0 - 46.0 % 37.0  36.9  39.3   Platelets 150 - 400 K/uL 157  138  149       Latest Ref Rng & Units 10/18/2023    3:56 AM 10/17/2023    3:08 AM 10/16/2023    1:33 PM  CMP  Glucose 70 - 99 mg/dL 898  98  93   BUN 8 - 23 mg/dL 23  21  22    Creatinine 0.44 - 1.00 mg/dL 8.76  8.76  8.74   Sodium 135 - 145 mmol/L 144  142  138   Potassium 3.5 - 5.1 mmol/L 3.9  3.8  4.7   Chloride 98 - 111 mmol/L 111  112  103   CO2 22 - 32 mmol/L 25  22  22    Calcium  8.9 - 10.3 mg/dL 9.0  8.7  9.5   Total Protein 6.5 - 8.1 g/dL  5.6    Total Bilirubin 0.0 - 1.2 mg/dL  0.6    Alkaline Phos 38 - 126 U/L  63    AST 15 - 41 U/L  18    ALT 0 - 44 U/L  16        Imaging: Radiological images reviewed:  CLINICAL DATA:  Right lower quadrant abdominal pain.    EXAM: CT ABDOMEN AND PELVIS WITH CONTRAST   TECHNIQUE: Multidetector CT imaging of the abdomen and pelvis was performed using the standard protocol following bolus administration of intravenous contrast.   RADIATION DOSE REDUCTION: This exam was performed according to the departmental dose-optimization program which includes automated exposure control, adjustment of the mA and/or kV according to patient size and/or use of iterative reconstruction technique.   CONTRAST:  75mL OMNIPAQUE  IOHEXOL  300 MG/ML  SOLN   COMPARISON:  April 13, 2023.   FINDINGS: Lower chest: Large left pleural effusion is noted with small right pleural effusion, with adjacent atelectasis of the lower lobes.   Hepatobiliary: No focal liver abnormality is seen. No gallstones, gallbladder wall thickening, or biliary dilatation.   Pancreas: Unremarkable. No pancreatic ductal dilatation or surrounding inflammatory changes.   Spleen: Normal in size without focal abnormality.   Adrenals/Urinary Tract: Adrenal glands and kidneys appear normal. No hydronephrosis or renal obstruction is noted. Urinary bladder is displaced to the left by moderate size right-sided pelvic hematoma which appears to be slightly enlarged compared to prior exam.   Stomach/Bowel: The stomach is unremarkable. There is no evidence of bowel obstruction or inflammation. Status post appendectomy.   Vascular/Lymphatic: Aortic atherosclerosis. No enlarged abdominal or pelvic lymph nodes.   Reproductive: Status post hysterectomy. No adnexal masses.   Other: As noted above, moderate size right-sided retroperitoneal hematoma is noted which extends into right pelvic region, where it is mildly enlarged. Subcutaneous gas is noted in the right lower quadrant suggesting postsurgical change.   Musculoskeletal: No acute or significant osseous findings.   IMPRESSION: Moderate sized right retroperitoneal hematoma is noted which extends into  right pelvic region and displaces urinary bladder to the left. The pelvic component appears to be mildly enlarged compared to prior exam.   Bilateral pleural effusions are noted, left greater than right, with associated atelectasis of the lower lobes.     Electronically Signed   By: Lynwood Landy Raddle M.D.   On: 04/22/2023 17:45  CLINICAL DATA:  R inguinal pain, right upper thigh redness   EXAM: CT ABDOMEN AND PELVIS WITH CONTRAST   TECHNIQUE: Multidetector CT imaging of the abdomen and pelvis was performed using the standard protocol following bolus administration of intravenous contrast.   RADIATION DOSE REDUCTION: This exam was performed according to the departmental dose-optimization program which includes automated exposure control, adjustment of the mA and/or kV according to patient size and/or use of iterative reconstruction technique.   CONTRAST:  OMNIPAQUE  IOHEXOL  300 MG/ML  SOLN   COMPARISON:  April 22, 2023   FINDINGS: Lower chest: Small left pleural effusion with left basilar atelectasis.No cardiomegaly.   Hepatobiliary: No mass.No radiopaque stones or wall thickening of the gallbladder. No intrahepatic or extrahepatic biliary ductal dilation. The portal veins are patent.   Pancreas: No mass or main ductal dilation. No peripancreatic inflammation or fluid collection.   Spleen: Normal size. No mass.   Adrenals/Urinary Tract: No adrenal masses. Multifocal cortical scarring in both kidneys. No mass. No nephrolithiasis or hydronephrosis. Partially distended urinary bladder  without visualized abnormality.   Stomach/Bowel: The stomach contains ingested material without focal abnormality. no small bowel wall thickening or inflammation. No small bowel obstruction.The appendix is not visualized, likely surgically absent. Along the posterior aspect of the cecum, there is a fluid-filled structure with mucosal hyperenhancement, measuring 2.2 cm (axial 46),  with adjacent stranding. Total colonic diverticulosis is also noted scattered throughout the colon.   Vascular/Lymphatic: No aortic aneurysm. Diffuse aortoiliac atherosclerosis. No intraabdominal or pelvic lymphadenopathy.   Reproductive: Hysterectomy. No concerning adnexal mass.No free pelvic fluid.   Other: No pneumoperitoneum or ascites   Musculoskeletal: No acute fracture or destructive lesion. Partially visualized breast implants bilaterally. Multilevel degenerative disc disease of the spine. Skin thickening and soft tissue irregularity in the right inguinal region, overall measuring 3.9 x 3.3 x 5 (APxTRxCC). No well-formed abscess or drainable fluid collection.   IMPRESSION: 1. Skin thickening and soft tissue irregularity in the right inguinal region, overall measuring 3.9 x 3.3 x 5 (APxTRxCC). This could represent combination of postsurgical change with cellulitis. No well-formed abscess or drainable fluid collection. 2. Along the posterior aspect of the cecum, there is a fluid-filled structure with mucosal hyperenhancement, measuring 2.2 cm (axial 46), with adjacent stranding. While the adjacent stranding could be chronic changes related to the remote retroperitoneal hematoma, this is more worrisome for an acute process, such as acute diverticulitis. Alternatively, in the setting of a prior appendectomy, fluid trapped within the appendiceal stump, due to stump appendicitis could also have this appearance. Nonemergent colonoscopy recommended to exclude underlying neoplasm. 3. Small left pleural effusion.     Electronically Signed   By: Rogelia Myers M.D.   On: 10/16/2023 17:58 Within last 24 hrs: No results found.  Assessment    Soft readily reducible right lower quadrant/incisional hernia.   Persisting presence of appendiceal/cecal process following conservative management of complicated appendicitis. Patient Active Problem List   Diagnosis Date Noted   History  of appendicitis 11/29/2023   Hypertensive urgency 10/17/2023   Paroxysmal atrial fibrillation (HCC) 10/17/2023   Abnormal abdominal CT scan 10/17/2023   Right inguinal hernia 09/25/2023   S/P thoracentesis 05/07/2023   Thrombocytopenia (HCC) 05/06/2023   Acute kidney injury superimposed on stage 3a chronic kidney disease (HCC) 05/02/2023   Metabolic acidosis 05/02/2023   Overweight (BMI 25.0-29.9) 05/02/2023   HFrEF (heart failure with reduced ejection fraction) (HCC) 04/23/2023   CAD (coronary artery disease) 04/22/2023   Chronic kidney disease, stage 3a (HCC) 04/22/2023   Peritoneal hematoma 04/22/2023   Pleural effusion 04/22/2023   Abnormal LFTs 04/22/2023   Hypokalemia 04/22/2023   Atrial fibrillation, chronic (HCC) 04/22/2023   Diarrhea 04/22/2023   Tobacco abuse 04/13/2023   NSTEMI (non-ST elevated myocardial infarction) (HCC) 04/12/2023   Chest pain 04/12/2023   OAB (overactive bladder) 04/27/2022   Chronic diastolic heart failure (HCC) 09/27/2021   Insomnia 09/06/2021   Perforated appendicitis 08/31/2021   Carotid artery occlusion 02/07/2021   Cerebral atherosclerosis 02/07/2021   Hypertensive heart disease without congestive heart failure 02/07/2021   Occlusion and stenosis of bilateral carotid arteries 02/07/2021   Vitamin D  deficiency, unspecified 02/07/2021   Chronic anticoagulation 01/27/2021   Senile purpura (HCC) 12/03/2020   Prediabetes 10/08/2019   Stenosis of right carotid artery 10/03/2018   HLD (hyperlipidemia) 10/03/2018   Essential hypertension 10/02/2018   CKD (chronic kidney disease) stage 3, GFR 30-59 ml/min (HCC) 06/17/2018   Chronic forearm pain (Primary Area of Pain) (Left) 06/17/2018   Neurogenic pain 06/17/2018   Chronic pain syndrome 06/06/2018  Long term current use of opiate analgesic 06/06/2018    Plan    Attenuation versus hernia was discussed.  Prioritization of this patient's medical concerns,  #1 colonoscopy to ensure no other  processes involving the right colon/cecum.  #2 would then follow in evaluating appendiceal/cecal process to ensure benignity.  #3. Last of all would be pursuing repair/plication of this eventrated area of her right lower quadrant abdominal wall.  This may be combined with any appendiceal intervention, but would likely preclude simultaneous use of mesh if even warranted.  This was discussed with the patient and her husband At present, minimally symptomatic, explained the role of observation and optimal timing to consider elective repair.     I personally spent a total of 30 minutes in the care of the patient today including preparing to see the patient, performing a medically appropriate exam/evaluation, counseling and educating, placing orders, referring and communicating with other health care professionals, documenting clinical information in the EHR, independently interpreting results, communicating results, and coordinating care.   These notes generated with voice recognition software. I apologize for typographical errors.  Honor Leghorn M.D., FACS 01/15/2024, 11:12 AM

## 2024-01-23 ENCOUNTER — Ambulatory Visit
Admission: RE | Admit: 2024-01-23 | Discharge: 2024-01-23 | Disposition: A | Discharge status: Home / Self Care | Source: Ambulatory Visit | Attending: Surgery | Admitting: Surgery

## 2024-01-23 DIAGNOSIS — K3532 Acute appendicitis with perforation and localized peritonitis, without abscess: Secondary | ICD-10-CM | POA: Diagnosis present

## 2024-01-23 DIAGNOSIS — R1031 Right lower quadrant pain: Secondary | ICD-10-CM | POA: Insufficient documentation

## 2024-01-23 LAB — POCT I-STAT CREATININE: Creatinine, Ser: 1.2 mg/dL — ABNORMAL HIGH (ref 0.44–1.00)

## 2024-01-23 MED ORDER — IOHEXOL 9 MG/ML PO SOLN
500.0000 mL | ORAL | Status: AC
  Administered 2024-01-23 (×2): 500 mL via ORAL

## 2024-01-23 MED ORDER — IOHEXOL 300 MG/ML  SOLN
80.0000 mL | Freq: Once | INTRAMUSCULAR | Status: AC | PRN
  Administered 2024-01-23: 75 mL via INTRAVENOUS

## 2024-01-25 ENCOUNTER — Encounter: Payer: Self-pay | Admitting: Surgery

## 2024-01-30 ENCOUNTER — Ambulatory Visit (INDEPENDENT_AMBULATORY_CARE_PROVIDER_SITE_OTHER)

## 2024-01-30 DIAGNOSIS — W908XXA Exposure to other nonionizing radiation, initial encounter: Secondary | ICD-10-CM

## 2024-01-30 DIAGNOSIS — L570 Actinic keratosis: Secondary | ICD-10-CM

## 2024-01-30 DIAGNOSIS — L821 Other seborrheic keratosis: Secondary | ICD-10-CM

## 2024-01-30 DIAGNOSIS — D489 Neoplasm of uncertain behavior, unspecified: Secondary | ICD-10-CM

## 2024-01-30 DIAGNOSIS — C44722 Squamous cell carcinoma of skin of right lower limb, including hip: Secondary | ICD-10-CM

## 2024-01-30 DIAGNOSIS — D1801 Hemangioma of skin and subcutaneous tissue: Secondary | ICD-10-CM

## 2024-01-30 DIAGNOSIS — C4492 Squamous cell carcinoma of skin, unspecified: Secondary | ICD-10-CM

## 2024-01-30 DIAGNOSIS — L814 Other melanin hyperpigmentation: Secondary | ICD-10-CM

## 2024-01-30 DIAGNOSIS — Z85828 Personal history of other malignant neoplasm of skin: Secondary | ICD-10-CM

## 2024-01-30 DIAGNOSIS — Z1283 Encounter for screening for malignant neoplasm of skin: Secondary | ICD-10-CM

## 2024-01-30 DIAGNOSIS — D485 Neoplasm of uncertain behavior of skin: Secondary | ICD-10-CM | POA: Diagnosis not present

## 2024-01-30 DIAGNOSIS — L578 Other skin changes due to chronic exposure to nonionizing radiation: Secondary | ICD-10-CM

## 2024-01-30 HISTORY — DX: Squamous cell carcinoma of skin, unspecified: C44.92

## 2024-01-30 NOTE — Progress Notes (Signed)
 Subjective   Dawn Bruce is a 77 y.o. female who presents for the following: Lesion(s) of concern . Patient is established patient   Today patient reports: Area of concern on right thigh for the past several months.  Review of Systems:    No other skin or systemic complaints except as noted in HPI or Assessment and Plan.  The following portions of the chart were reviewed this encounter and updated as appropriate: medications, allergies, medical history  Relevant Medical History:  Personal history of non melanoma skin cancer - see medical history for full details   Objective  Well appearing patient in no apparent distress; mood and affect are within normal limits. Examination was performed of the: Focused Exam of: Bilateral lower extremities and upper extremities, face   Examination notable for: Lentigo/lentigines: Scattered pigmented macules that are tan to brown in color and are somewhat non-uniform in shape and concentrated in the sun-exposed areas, Seborrheic Keratosis(es): Stuck-on appearing keratotic papule(s) on the trunk, none  irritated with redness, crusting, edema, and/or partial avulsion, Actinic Damage/Elastosis: chronic sun damage: dyspigmentation, telangiectasia, and wrinkling - Multiple scaly erythematous macules consistent with actinic keratoses located on face and arms  Examination limited by: Undergarments and Shoes or socks    Right Thigh - Anterior 1.2cm pink scaly plaque    Assessment & Plan   SKIN CANCER SCREENING PERFORMED TODAY.  BENIGN SKIN FINDINGS  - Lentigines  - Seborrheic keratoses  - Hemangiomas   - Nevus/Multiple Benign Nevi - Reassurance provided regarding the benign appearance of lesions noted on exam today; no treatment is indicated in the absence of symptoms/changes. - Reinforced importance of photoprotective strategies including liberal and frequent sunscreen use of a broad-spectrum SPF 30 or greater, use of protective clothing, and sun  avoidance for prevention of cutaneous malignancy and photoaging.  Counseled patient on the importance of regular self-skin monitoring as well as routine clinical skin examinations as scheduled.   ACTINIC DAMAGE with actinic keratosis  - Chronic condition, secondary to cumulative UV/sun exposure - Recommend daily broad spectrum sunscreen SPF 30+ to sun-exposed areas, reapply every 2 hours as needed.  - Staying in the shade or wearing long sleeves, sun glasses (UVA+UVB protection) and wide brim hats (4-inch brim around the entire circumference of the hat) are also recommended for sun protection.  - Call for new or changing lesions. - Defers tx of actinic keratosis to next visit   Personal history of non melanoma skin cancer  - Reviewed medical history for full details  - Reviewed sun protective measures as above - Encouraged full body skin exams     Level of service outlined above   Procedures, orders, diagnosis for this visit:  NEOPLASM OF UNCERTAIN BEHAVIOR Right Thigh - Anterior Skin / nail biopsy Type of biopsy: tangential   Informed consent: discussed and consent obtained   Patient was prepped and draped in usual sterile fashion: Area prepped with alcohol . Anesthesia: the lesion was anesthetized in a standard fashion   Anesthetic:  1% lidocaine  w/ epinephrine  1-100,000 buffered w/ 8.4% NaHCO3 Instrument used: flexible razor blade   Hemostasis achieved with: pressure, aluminum chloride and electrodesiccation   Outcome: patient tolerated procedure well   Post-procedure details: wound care instructions given   Post-procedure details comment:  Ointment and small bandage applied  Specimen 1 - Surgical pathology Differential Diagnosis: SCC vs ISK  Check Margins: No  Neoplasm of uncertain behavior -     Skin / nail biopsy -  Surgical pathology; Standing    Return to clinic: Return if symptoms worsen or fail to improve.  Documentation:  I, Emerick Ege, CMA am acting as  scribe for Lauraine JAYSON Kanaris, MD I have reviewed the above documentation for accuracy and completeness, and I agree with the above.  Lauraine JAYSON Kanaris, MD

## 2024-01-30 NOTE — Patient Instructions (Addendum)

## 2024-01-31 ENCOUNTER — Encounter: Payer: Self-pay | Admitting: *Deleted

## 2024-01-31 LAB — SURGICAL PATHOLOGY

## 2024-02-04 ENCOUNTER — Ambulatory Visit: Payer: Self-pay

## 2024-02-05 ENCOUNTER — Encounter: Payer: Self-pay | Admitting: Surgery

## 2024-02-05 ENCOUNTER — Ambulatory Visit (INDEPENDENT_AMBULATORY_CARE_PROVIDER_SITE_OTHER): Admitting: Surgery

## 2024-02-05 VITALS — BP 160/90 | HR 60 | Ht 63.0 in | Wt 120.0 lb

## 2024-02-05 DIAGNOSIS — R935 Abnormal findings on diagnostic imaging of other abdominal regions, including retroperitoneum: Secondary | ICD-10-CM

## 2024-02-05 DIAGNOSIS — K432 Incisional hernia without obstruction or gangrene: Secondary | ICD-10-CM

## 2024-02-05 DIAGNOSIS — Z8719 Personal history of other diseases of the digestive system: Secondary | ICD-10-CM

## 2024-02-05 NOTE — Progress Notes (Signed)
 Patient ID: Dawn Bruce, female   DOB: 01-16-1946, 78 y.o.   MRN: [id number]  Chief Complaint: Right lower quadrant bulge  History of Present Illness Here to follow-up recent CT scan exam, in the interim she has seen Dr. Tye. Still awaiting her colonoscopy.  CT scan provided some excellent imaging especially of the right lower quadrant with contrast throughout her distal ileum and cecum.  I do not appreciate any obvious pericecal pathology, or even the appendiceal stump as anything concerning.  She has a very widemouth eventration/defect in the right lower quadrant.  As further discussion goes considered the possibility of just external compression through girdle, that may alleviate any concerns about needing to proceed on with an operation.  She seemed very relieved at that potential, as the risks of this hernia for incarceration or strangulation are minimal.  They feel it has enlarged some, but are also desiring to avoid another operative intervention.  Dawn Bruce is a 78 y.o. female with prior history of conservatively managed appendiceal abscess with percutaneous drainage and antibiotics.  She has done remarkably well from this, and in the interval has gone through a significant amount of vascular intervention.  Even time in ICU and near loss of life perceived.  She now presents with a significant, but somewhat asymptomatic bulge in the right lower quadrant/high inguinal area.  It goes away when she lays down, presents when standing.  She denies pain or tenderness.  Had no bowel habit changes.  She also has a wound healing in that right groin from her vascular intervention, along with the retroperitoneal hematoma.  She has also been on Eliquis  and Plavix  since November.  She has since seen her cardiologist in late July.  She currently has a colonoscopy currently scheduled for March 18, 2024.  It was delayed due to some wedding plans that were critical for her not to miss.   However concerns around  the appendix area, warrant further evaluation, and I concur that colonoscopy would be indicated to identify the degree of surgery necessary.  Since some much time is passed up believe would be worthwhile to repeat a CT scan as well.  Past Medical History Past Medical History:  Diagnosis Date   Actinic keratosis    Chronic pain    GERD (gastroesophageal reflux disease) ?   Taking Pepcid    History of basal cell carcinoma (BCC) 05/02/2021   right upper back paraspinal   History of SCC (squamous cell carcinoma) of skin 09/10/2019   left dorsum wrist ED&C done 10/28/19   History of SCC (squamous cell carcinoma) of skin 03/04/2020   right dorsum hand   History of squamous cell carcinoma in situ (SCCIS) 03/04/2020   left dorsum wrist  ED&C   History of squamous cell carcinoma in situ (SCCIS) 03/04/2020   right medial infraorbital  ED&C 04/28/2020   History of squamous cell carcinoma in situ (SCCIS) 10/28/2019   right dorsum hand proximal lateral   History of squamous cell carcinoma in situ (SCCIS) 10/28/2019   right dorsum hand proximal medial   Hyperlipidemia    Hypertension    NSTEMI (non-ST elevated myocardial infarction) (HCC) 04/12/2023   Renal disorder    Squamous cell carcinoma in situ 10/31/2021   left distal  tricep, EDC   Squamous cell carcinoma of skin 08/28/2022   Right volar forearm - EDC      Past Surgical History:  Procedure Laterality Date   ABDOMINAL HYSTERECTOMY     APPENDECTOMY  April 2023  Burst appendix   APPLICATION OF WOUND VAC Right 04/25/2023   Procedure: APPLICATION OF WOUND VAC;  Surgeon: Jama Cordella MATSU, MD;  Location: [facility];  Service: Vascular;  Laterality: Right;   APPLICATION OF WOUND VAC Right 04/27/2023   Procedure: WOUND VAC EXCHANGE;  Surgeon: Jama Cordella MATSU, MD;  Location: [facility];  Service: Vascular;  Laterality: Right;   APPLICATION OF WOUND VAC Right 05/01/2023   Procedure: APPLICATION OF WOUND VAC;  Surgeon: Jama Cordella MATSU, MD;   Location: [facility];  Service: Vascular;  Laterality: Right;   AUGMENTATION MAMMAPLASTY Bilateral    25-30 years ago   CARDIOVERSION N/A 12/01/2020   Procedure: CARDIOVERSION;  Surgeon: Darliss Rogue, MD;  Location: [facility];  Service: Cardiovascular;  Laterality: N/A;   CAROTID ARTERY ANGIOPLASTY Right 1995(approximate)   CAROTID ENDARTERECTOMY  2001   CORONARY STENT INTERVENTION N/A 04/13/2023   Procedure: CORONARY STENT INTERVENTION;  Surgeon: Ammon Blunt, MD;  Location: [facility];  Service: Cardiovascular;  Laterality: N/A;   COSMETIC SURGERY     ELBOW SURGERY Left    ENDARTERECTOMY FEMORAL Right 04/13/2023   Procedure: Right groin exploration with repair of right external iliac artery and right circumflex artery;  Surgeon: Tisa Curry LABOR, MD;  Location: [facility];  Service: Vascular;  Laterality: Right;   FRACTURE SURGERY  2016   Arm   INCISION AND DRAINAGE OF WOUND Right 04/25/2023   Procedure: IRRIGATION AND DEBRIDEMENT WOUND;  Surgeon: Jama Cordella MATSU, MD;  Location: [facility];  Service: Vascular;  Laterality: Right;   INCISION AND DRAINAGE OF WOUND Right 04/27/2023   Procedure: IRRIGATION AND DEBRIDEMENT WOUND;  Surgeon: Jama Cordella MATSU, MD;  Location: [facility];  Service: Vascular;  Laterality: Right;   INCISION AND DRAINAGE OF WOUND Right 05/01/2023   Procedure: IRRIGATION AND DEBRIDEMENT WOUND;  Surgeon: Jama Cordella MATSU, MD;  Location: [facility];  Service: Vascular;  Laterality: Right;   LEFT HEART CATH AND CORONARY ANGIOGRAPHY N/A 04/13/2023   Procedure: LEFT HEART CATH AND CORONARY ANGIOGRAPHY;  Surgeon: Ammon Blunt, MD;  Location: [facility];  Service: Cardiovascular;  Laterality: N/A;   THORACENTESIS N/A 12/27/2020   Procedure: MILANA;  Surgeon: Brenna Adine CROME, DO;  Location: [facility];  Service: Pulmonary;  Laterality: N/A;   TONSILLECTOMY     TUBAL LIGATION  ?    No Known Allergies  Current Outpatient  Medications  Medication Sig Dispense Refill   apixaban  (ELIQUIS ) 5 MG TABS tablet Take 5 mg by mouth 2 (two) times daily.     atorvastatin  (LIPITOR ) 40 MG tablet Take 1 tablet (40 mg total) by mouth daily. 90 tablet 1   carvedilol  (COREG ) 12.5 MG tablet Take 12.5 mg by mouth 2 (two) times daily with a meal.     clopidogrel  (PLAVIX ) 75 MG tablet Take 1 tablet (75 mg total) by mouth daily. 90 tablet 1   empagliflozin  (JARDIANCE ) 10 MG TABS tablet Take 10 mg by mouth daily.     ezetimibe  (ZETIA ) 10 MG tablet TAKE 1 TABLET(10 MG) BY MOUTH DAILY 90 tablet 0   fentaNYL  (DURAGESIC ) 75 MCG/HR Place 1 patch onto the skin every 3 (three) days. 10 patch 0   pregabalin  (LYRICA ) 150 MG capsule Take 1 capsule (150 mg total) by mouth 3 (three) times daily. 270 capsule 1   traZODone  (DESYREL ) 50 MG tablet Take 50 mg by mouth at bedtime as needed for sleep.     No current facility-administered medications for this visit.  Family History Family History  Problem Relation Age of Onset   Cancer Mother    Cancer Father    Cancer Sister    Breast cancer Neg Hx       Social History Social History   Tobacco Use   Smoking status: Former    Current packs/day: 0.00    Average packs/day: 0.3 packs/day for 15.0 years (3.8 ttl pk-yrs)    Types: Cigarettes    Start date: 51    Quit date: 77    Years since quitting: 35.7    Passive exposure: Past   Smokeless tobacco: Never   Tobacco comments:    quit around 1990-1995 (?)  Vaping Use   Vaping status: Never Used  Substance Use Topics   Alcohol  use: Yes    Alcohol /week: 14.0 standard drinks of alcohol     Types: 14 Standard drinks or equivalent per week    Comment: 2 scotch shots   Drug use: Never        Review of Systems  All other systems reviewed and are negative.    Physical Exam Blood pressure (!) 160/90, pulse 60, height 5' 3 (1.6 m), weight 120 lb (54.4 kg), SpO2 98%. Last Weight  Most recent update: 02/05/2024 10:12 AM    Weight   54.4 kg (120 lb)               CONSTITUTIONAL: Well developed, and nourished, appropriately responsive and aware without distress.   EYES: Sclera non-icteric.   EARS, NOSE, MOUTH AND THROAT:  The oropharynx is clear. Oral mucosa is pink and moist.     Hearing is intact to voice.  NECK: Trachea is midline, and there is no jugular venous distension.  LYMPH NODES:  Lymph nodes in the neck are not appreciated. RESPIRATORY:  Lungs are clear, and breath sounds are equal bilaterally.  Normal respiratory effort without pathologic use of accessory muscles. CARDIOVASCULAR:   Well perfused.  GI: The abdomen is  soft, nontender, and nondistended. There were no palpable masses.  I did not appreciate hepatosplenomegaly.  GU: Caryl Lyn present as chaperone, exam is essentially unchanged, there is a widemouthed bulge high in the right groin, consistent with a RLQ abdominal wall eventration or incisional hernia.  There is a complex vertical scar in the right groin just inferior to this bulge.  It remains readily reducible, consistent with a loss of muscle tone/eventration rather than a true fascial defect.  MUSCULOSKELETAL:  Symmetrical muscle tone appreciated in all four extremities.    SKIN: Skin turgor is normal. No pathologic skin lesions appreciated.  NEUROLOGIC:  Motor and sensation appear grossly normal.  Cranial nerves are grossly without defect. PSYCH:  Alert and oriented to person, place and time. Affect is appropriate for situation.  Data Reviewed I have personally reviewed what is currently available of the patient's imaging, recent labs and medical records.   Labs:     Latest Ref Rng & Units 10/18/2023    3:56 AM 10/17/2023    3:08 AM 10/16/2023    1:33 PM  CBC  WBC 4.0 - 10.5 K/uL 4.9  7.4  8.9   Hemoglobin 12.0 - 15.0 g/dL 88.2  88.0  87.9   Hematocrit 36.0 - 46.0 % 37.0  36.9  39.3   Platelets 150 - 400 K/uL 157  138  149       Latest Ref Rng & Units 01/23/2024    3:24 PM 10/18/2023     3:56 AM 10/17/2023  3:08 AM  CMP  Glucose 70 - 99 mg/dL  898  98   BUN 8 - 23 mg/dL  23  21   Creatinine 9.55 - 1.00 mg/dL 8.79  8.76  8.76   Sodium 135 - 145 mmol/L  144  142   Potassium 3.5 - 5.1 mmol/L  3.9  3.8   Chloride 98 - 111 mmol/L  111  112   CO2 22 - 32 mmol/L  25  22   Calcium  8.9 - 10.3 mg/dL  9.0  8.7   Total Protein 6.5 - 8.1 g/dL   5.6   Total Bilirubin 0.0 - 1.2 mg/dL   0.6   Alkaline Phos 38 - 126 U/L   63   AST 15 - 41 U/L   18   ALT 0 - 44 U/L   16       Imaging: Radiological images reviewed:  CLINICAL DATA:  Right lower quadrant abdominal pain.   EXAM: CT ABDOMEN AND PELVIS WITH CONTRAST   TECHNIQUE: Multidetector CT imaging of the abdomen and pelvis was performed using the standard protocol following bolus administration of intravenous contrast.   RADIATION DOSE REDUCTION: This exam was performed according to the departmental dose-optimization program which includes automated exposure control, adjustment of the mA and/or kV according to patient size and/or use of iterative reconstruction technique.   CONTRAST:  75mL OMNIPAQUE  IOHEXOL  300 MG/ML  SOLN   COMPARISON:  April 13, 2023.   FINDINGS: Lower chest: Large left pleural effusion is noted with small right pleural effusion, with adjacent atelectasis of the lower lobes.   Hepatobiliary: No focal liver abnormality is seen. No gallstones, gallbladder wall thickening, or biliary dilatation.   Pancreas: Unremarkable. No pancreatic ductal dilatation or surrounding inflammatory changes.   Spleen: Normal in size without focal abnormality.   Adrenals/Urinary Tract: Adrenal glands and kidneys appear normal. No hydronephrosis or renal obstruction is noted. Urinary bladder is displaced to the left by moderate size right-sided pelvic hematoma which appears to be slightly enlarged compared to prior exam.   Stomach/Bowel: The stomach is unremarkable. There is no evidence of bowel obstruction  or inflammation. Status post appendectomy.   Vascular/Lymphatic: Aortic atherosclerosis. No enlarged abdominal or pelvic lymph nodes.   Reproductive: Status post hysterectomy. No adnexal masses.   Other: As noted above, moderate size right-sided retroperitoneal hematoma is noted which extends into right pelvic region, where it is mildly enlarged. Subcutaneous gas is noted in the right lower quadrant suggesting postsurgical change.   Musculoskeletal: No acute or significant osseous findings.   IMPRESSION: Moderate sized right retroperitoneal hematoma is noted which extends into right pelvic region and displaces urinary bladder to the left. The pelvic component appears to be mildly enlarged compared to prior exam.   Bilateral pleural effusions are noted, left greater than right, with associated atelectasis of the lower lobes.     Electronically Signed   By: Lynwood Landy Raddle M.D.   On: 04/22/2023 17:45 ________________________________________________________________________________ CLINICAL DATA:  R inguinal pain, right upper thigh redness   EXAM: CT ABDOMEN AND PELVIS WITH CONTRAST   TECHNIQUE: Multidetector CT imaging of the abdomen and pelvis was performed using the standard protocol following bolus administration of intravenous contrast.   RADIATION DOSE REDUCTION: This exam was performed according to the departmental dose-optimization program which includes automated exposure control, adjustment of the mA and/or kV according to patient size and/or use of iterative reconstruction technique.   CONTRAST:  OMNIPAQUE  IOHEXOL  300 MG/ML  SOLN  COMPARISON:  April 22, 2023   FINDINGS: Lower chest: Small left pleural effusion with left basilar atelectasis.No cardiomegaly.   Hepatobiliary: No mass.No radiopaque stones or wall thickening of the gallbladder. No intrahepatic or extrahepatic biliary ductal dilation. The portal veins are patent.   Pancreas: No mass or  main ductal dilation. No peripancreatic inflammation or fluid collection.   Spleen: Normal size. No mass.   Adrenals/Urinary Tract: No adrenal masses. Multifocal cortical scarring in both kidneys. No mass. No nephrolithiasis or hydronephrosis. Partially distended urinary bladder without visualized abnormality.   Stomach/Bowel: The stomach contains ingested material without focal abnormality. no small bowel wall thickening or inflammation. No small bowel obstruction.The appendix is not visualized, likely surgically absent. Along the posterior aspect of the cecum, there is a fluid-filled structure with mucosal hyperenhancement, measuring 2.2 cm (axial 46), with adjacent stranding. Total colonic diverticulosis is also noted scattered throughout the colon.   Vascular/Lymphatic: No aortic aneurysm. Diffuse aortoiliac atherosclerosis. No intraabdominal or pelvic lymphadenopathy.   Reproductive: Hysterectomy. No concerning adnexal mass.No free pelvic fluid.   Other: No pneumoperitoneum or ascites   Musculoskeletal: No acute fracture or destructive lesion. Partially visualized breast implants bilaterally. Multilevel degenerative disc disease of the spine. Skin thickening and soft tissue irregularity in the right inguinal region, overall measuring 3.9 x 3.3 x 5 (APxTRxCC). No well-formed abscess or drainable fluid collection.   IMPRESSION: 1. Skin thickening and soft tissue irregularity in the right inguinal region, overall measuring 3.9 x 3.3 x 5 (APxTRxCC). This could represent combination of postsurgical change with cellulitis. No well-formed abscess or drainable fluid collection. 2. Along the posterior aspect of the cecum, there is a fluid-filled structure with mucosal hyperenhancement, measuring 2.2 cm (axial 46), with adjacent stranding. While the adjacent stranding could be chronic changes related to the remote retroperitoneal hematoma, this is more worrisome for an acute  process, such as acute diverticulitis. Alternatively, in the setting of a prior appendectomy, fluid trapped within the appendiceal stump, due to stump appendicitis could also have this appearance. Nonemergent colonoscopy recommended to exclude underlying neoplasm. 3. Small left pleural effusion.     Electronically Signed   By: Rogelia Myers M.D.   On: 10/16/2023 17:58 _______________________________________________________________________________ CLINICAL DATA:  Right inguinal pain. Right lower quadrant palpable abnormality.   EXAM: CT ABDOMEN AND PELVIS WITH CONTRAST   TECHNIQUE: Multidetector CT imaging of the abdomen and pelvis was performed using the standard protocol following bolus administration of intravenous contrast.   RADIATION DOSE REDUCTION: This exam was performed according to the departmental dose-optimization program which includes automated exposure control, adjustment of the mA and/or kV according to patient size and/or use of iterative reconstruction technique.   CONTRAST:  75mL OMNIPAQUE  IOHEXOL  300 MG/ML  SOLN   COMPARISON:  10/16/2023   FINDINGS: Lower Chest: No acute findings. Stable left basilar pleural-parenchymal scarring.   Hepatobiliary: No suspicious hepatic masses identified. Gallbladder is unremarkable. No evidence of biliary ductal dilatation.   Pancreas:  No mass or inflammatory changes.   Spleen: Within normal limits in size and appearance.   Adrenals/Urinary Tract: No suspicious masses identified. Stable bilateral renal parenchymal scarring. No evidence of ureteral calculi or hydronephrosis.   Stomach/Bowel: No evidence of obstruction, inflammatory process or abnormal fluid collections. Diverticulosis is seen mainly involving the sigmoid colon, however there is no evidence of diverticulitis. A small right lower quadrant ventral abdominal wall hernia is seen which contains several small bowel loops and is increased in size since  previous study. No evidence of bowel  obstruction or strangulation.   Vascular/Lymphatic: No pathologically enlarged lymph nodes. No acute vascular findings.   Reproductive: Prior hysterectomy noted. Adnexal regions are unremarkable in appearance.   Other:  None.   Musculoskeletal:  No suspicious bone lesions identified.   IMPRESSION: Increased size of right lower quadrant ventral abdominal wall hernia containing several small bowel loops. No evidence of obstruction or strangulation.   Colonic diverticulosis, without radiographic evidence of diverticulitis.     Electronically Signed   By: Norleen DELENA Kil M.D.   On: 01/27/2024 13:32    Assessment    Soft readily reducible right lower quadrant/incisional hernia.   No lingering presence of appendiceal/cecal process following conservative management of complicated appendicitis.  Colonoscopy pending. Patient Active Problem List   Diagnosis Date Noted   Right inguinal pain 01/15/2024   History of appendicitis 11/29/2023   Hypertensive urgency 10/17/2023   Paroxysmal atrial fibrillation (HCC) 10/17/2023   Abnormal abdominal CT scan 10/17/2023   Right inguinal hernia 09/25/2023   S/P thoracentesis 05/07/2023   Thrombocytopenia (HCC) 05/06/2023   Acute kidney injury superimposed on stage 3a chronic kidney disease (HCC) 05/02/2023   Metabolic acidosis 05/02/2023   Overweight (BMI 25.0-29.9) 05/02/2023   HFrEF (heart failure with reduced ejection fraction) (HCC) 04/23/2023   CAD (coronary artery disease) 04/22/2023   Chronic kidney disease, stage 3a (HCC) 04/22/2023   Peritoneal hematoma 04/22/2023   Pleural effusion 04/22/2023   Abnormal LFTs 04/22/2023   Hypokalemia 04/22/2023   Atrial fibrillation, chronic (HCC) 04/22/2023   Diarrhea 04/22/2023   Tobacco abuse 04/13/2023   NSTEMI (non-ST elevated myocardial infarction) (HCC) 04/12/2023   Chest pain 04/12/2023   OAB (overactive bladder) 04/27/2022   Chronic diastolic heart  failure (HCC) 94/90/7976   Insomnia 09/06/2021   Perforated appendicitis 08/31/2021   Carotid artery occlusion 02/07/2021   Cerebral atherosclerosis 02/07/2021   Hypertensive heart disease without congestive heart failure 02/07/2021   Occlusion and stenosis of bilateral carotid arteries 02/07/2021   Vitamin D  deficiency, unspecified 02/07/2021   Chronic anticoagulation 01/27/2021   Senile purpura (HCC) 12/03/2020   Prediabetes 10/08/2019   Stenosis of right carotid artery 10/03/2018   HLD (hyperlipidemia) 10/03/2018   Essential hypertension 10/02/2018   CKD (chronic kidney disease) stage 3, GFR 30-59 ml/min (HCC) 06/17/2018   Chronic forearm pain (Primary Area of Pain) (Left) 06/17/2018   Neurogenic pain 06/17/2018   Chronic pain syndrome 06/06/2018   Long term current use of opiate analgesic 06/06/2018    Plan    Attenuation versus hernia again discussed.  Prioritization of this patient's medical concerns,  #1 colonoscopy to ensure no other processes involving the right colon/cecum.  #2 would then follow in evaluating appendiceal/cecal process, as needed, to ensure benignity.  #3. Last of all would be pursuing repair/plication of this eventrated area of her right lower quadrant abdominal wall.  An excellent idea of just providing external support, I believe is well warranted for this mildly fragile lady, and this was discussed with the patient and her husband.  I believe they will seek out some form of supportive undergarment like a girdle or bicycle style shorts etc. At present, minimally symptomatic, explained the role of observation and optimal timing to consider elective repair.     I personally spent a total of 20 minutes in the care of the patient today including preparing to see the patient, performing a medically appropriate exam/evaluation, counseling and educating, placing orders, referring and communicating with other health care professionals, documenting clinical  information in the EHR,  independently interpreting results, communicating results, and coordinating care.   These notes generated with voice recognition software. I apologize for typographical errors.  Honor Leghorn M.D., FACS 02/05/2024, 10:35 AM

## 2024-02-05 NOTE — Telephone Encounter (Signed)
-----   Message from Dawn Bruce sent at 02/04/2024  9:21 AM EDT -----  1. Skin, right thigh - anterior :       WELL DIFFERENTIATED SQUAMOUS CELL CARCINOMA, ACANTHOLYTIC (ADENOID) VARIANT   Please notify patient with below plan: Excision  ----- Message ----- From: Interface, Lab In Three Zero One Sent: 01/31/2024   9:42 PM EDT To: Dawn JAYSON Kanaris, MD

## 2024-02-05 NOTE — Patient Instructions (Addendum)
 You may want to consider wearing a supportive belt or girdle to provide counter pressure and support to the area.     Follow-up with our office as needed.  Please call and ask to speak with a nurse if you develop questions or concerns.

## 2024-02-05 NOTE — Telephone Encounter (Signed)
 Discussed pathology results with patient and surgery appointment scheduled.

## 2024-02-18 ENCOUNTER — Other Ambulatory Visit: Payer: Self-pay | Admitting: Family Medicine

## 2024-02-18 DIAGNOSIS — E782 Mixed hyperlipidemia: Secondary | ICD-10-CM

## 2024-03-10 ENCOUNTER — Ambulatory Visit
Admission: RE | Admit: 2024-03-10 | Discharge: 2024-03-10 | Disposition: A | Discharge status: Home / Self Care | Attending: Gastroenterology | Admitting: Gastroenterology

## 2024-03-10 ENCOUNTER — Encounter
Admission: RE | Disposition: A | Discharge status: Home / Self Care | Payer: Self-pay | Source: Home / Self Care | Attending: Gastroenterology

## 2024-03-10 ENCOUNTER — Ambulatory Visit: Admitting: Anesthesiology

## 2024-03-10 ENCOUNTER — Other Ambulatory Visit: Payer: Self-pay

## 2024-03-10 DIAGNOSIS — N183 Chronic kidney disease, stage 3 unspecified: Secondary | ICD-10-CM | POA: Insufficient documentation

## 2024-03-10 DIAGNOSIS — Z7984 Long term (current) use of oral hypoglycemic drugs: Secondary | ICD-10-CM | POA: Insufficient documentation

## 2024-03-10 DIAGNOSIS — K219 Gastro-esophageal reflux disease without esophagitis: Secondary | ICD-10-CM | POA: Insufficient documentation

## 2024-03-10 DIAGNOSIS — Z79899 Other long term (current) drug therapy: Secondary | ICD-10-CM | POA: Diagnosis not present

## 2024-03-10 DIAGNOSIS — I130 Hypertensive heart and chronic kidney disease with heart failure and stage 1 through stage 4 chronic kidney disease, or unspecified chronic kidney disease: Secondary | ICD-10-CM | POA: Insufficient documentation

## 2024-03-10 DIAGNOSIS — Z7902 Long term (current) use of antithrombotics/antiplatelets: Secondary | ICD-10-CM | POA: Insufficient documentation

## 2024-03-10 DIAGNOSIS — Z87891 Personal history of nicotine dependence: Secondary | ICD-10-CM | POA: Insufficient documentation

## 2024-03-10 DIAGNOSIS — K640 First degree hemorrhoids: Secondary | ICD-10-CM | POA: Diagnosis not present

## 2024-03-10 DIAGNOSIS — I509 Heart failure, unspecified: Secondary | ICD-10-CM | POA: Insufficient documentation

## 2024-03-10 DIAGNOSIS — I4891 Unspecified atrial fibrillation: Secondary | ICD-10-CM | POA: Diagnosis not present

## 2024-03-10 DIAGNOSIS — R933 Abnormal findings on diagnostic imaging of other parts of digestive tract: Secondary | ICD-10-CM | POA: Insufficient documentation

## 2024-03-10 DIAGNOSIS — I252 Old myocardial infarction: Secondary | ICD-10-CM | POA: Diagnosis not present

## 2024-03-10 DIAGNOSIS — Z7901 Long term (current) use of anticoagulants: Secondary | ICD-10-CM | POA: Diagnosis not present

## 2024-03-10 DIAGNOSIS — K5730 Diverticulosis of large intestine without perforation or abscess without bleeding: Secondary | ICD-10-CM | POA: Insufficient documentation

## 2024-03-10 HISTORY — PX: COLONOSCOPY: SHX5424

## 2024-03-10 SURGERY — COLONOSCOPY
Anesthesia: General

## 2024-03-10 MED ORDER — LIDOCAINE HCL (PF) 2 % IJ SOLN
INTRAMUSCULAR | Status: AC
  Filled 2024-03-10: qty 5

## 2024-03-10 MED ORDER — PHENYLEPHRINE 80 MCG/ML (10ML) SYRINGE FOR IV PUSH (FOR BLOOD PRESSURE SUPPORT)
PREFILLED_SYRINGE | INTRAVENOUS | Status: AC
  Filled 2024-03-10: qty 10

## 2024-03-10 MED ORDER — LIDOCAINE HCL (CARDIAC) PF 100 MG/5ML IV SOSY
PREFILLED_SYRINGE | INTRAVENOUS | Status: DC | PRN
  Administered 2024-03-10: 60 mg via INTRAVENOUS

## 2024-03-10 MED ORDER — PROPOFOL 500 MG/50ML IV EMUL
INTRAVENOUS | Status: DC | PRN
  Administered 2024-03-10: 100 ug/kg/min via INTRAVENOUS

## 2024-03-10 MED ORDER — SODIUM CHLORIDE 0.9 % IV SOLN
INTRAVENOUS | Status: DC
  Administered 2024-03-10: 500 mL via INTRAVENOUS

## 2024-03-10 MED ORDER — PROPOFOL 1000 MG/100ML IV EMUL
INTRAVENOUS | Status: AC
  Filled 2024-03-10: qty 100

## 2024-03-10 MED ORDER — PHENYLEPHRINE 80 MCG/ML (10ML) SYRINGE FOR IV PUSH (FOR BLOOD PRESSURE SUPPORT)
PREFILLED_SYRINGE | INTRAVENOUS | Status: DC | PRN
  Administered 2024-03-10 (×2): 80 ug via INTRAVENOUS

## 2024-03-10 MED ORDER — PROPOFOL 10 MG/ML IV BOLUS
INTRAVENOUS | Status: DC | PRN
Start: 2024-03-10 — End: 2024-03-10
  Administered 2024-03-10: 50 mg via INTRAVENOUS

## 2024-03-10 NOTE — Op Note (Signed)
 [facility] Patient Name: Courtland Coppa Procedure Date: 03/10/2024 12:25 PM MRN: [id number] Account #: [id number] Date of Birth: 1945/09/01 Admit Type: Outpatient Age: 78 Room: [facility] ENDO ROOM 3 Gender: Female Note Status: Finalized Instrument Name: Peds Colonoscope [id number] Procedure:             Colonoscopy Indications:           Abnormal CT of the GI tract Providers:             Ole Schick MD, MD Referring MD:          Ophelia Sage, MD (Referring MD) Medicines:             Monitored Anesthesia Care Complications:         No immediate complications. Procedure:             Pre-Anesthesia Assessment:                        - Prior to the procedure, a History and Physical was                         performed, and patient medications and allergies were                         reviewed. The patient is competent. The risks and                         benefits of the procedure and the sedation options and                         risks were discussed with the patient. All questions                         were answered and informed consent was obtained.                         Patient identification and proposed procedure were                         verified by the physician, the nurse, the                         anesthesiologist, the anesthetist and the technician                         in the endoscopy suite. Mental Status Examination:                         alert and oriented. Airway Examination: normal                         oropharyngeal airway and neck mobility. Respiratory                         Examination: clear to auscultation. CV Examination:                         normal. Prophylactic Antibiotics: The patient does not  require prophylactic antibiotics. Prior                         Anticoagulants: The patient has taken Plavix                          (clopidogrel ), last dose was 5 days prior to                          procedure. ASA Grade Assessment: III - A patient with                         severe systemic disease. After reviewing the risks and                         benefits, the patient was deemed in satisfactory                         condition to undergo the procedure. The anesthesia                         plan was to use monitored anesthesia care (MAC).                         Immediately prior to administration of medications,                         the patient was re-assessed for adequacy to receive                         sedatives. The heart rate, respiratory rate, oxygen                          saturations, blood pressure, adequacy of pulmonary                         ventilation, and response to care were monitored                         throughout the procedure. The physical status of the                         patient was re-assessed after the procedure.                        After obtaining informed consent, the colonoscope was                         passed under direct vision. Throughout the procedure,                         the patient's blood pressure, pulse, and oxygen                          saturations were monitored continuously. The                         Colonoscope was introduced through the anus and  advanced to the the cecum, identified by appendiceal                         orifice and ileocecal valve. The colonoscopy was                         performed without difficulty. The patient tolerated                         the procedure well. The quality of the bowel                         preparation was good. The ileocecal valve, appendiceal                         orifice, and rectum were photographed. Findings:      The perianal and digital rectal examinations were normal.      Multiple small-mouthed diverticula were found in the sigmoid colon.      Internal hemorrhoids were found during retroflexion. The hemorrhoids       were  Grade I (internal hemorrhoids that do not prolapse).      The exam was otherwise without abnormality on direct and retroflexion       views. Impression:            - Diverticulosis in the sigmoid colon.                        - Internal hemorrhoids.                        - The examination was otherwise normal on direct and                         retroflexion views.                        - No specimens collected. Recommendation:        - Discharge patient to home.                        - Resume previous diet.                        - Resume Eliquis  (apixaban ) today and Plavix                          (clopidogrel ) today at prior doses.                        - Repeat colonoscopy is not recommended due to current                         age (65 years or older) for screening purposes.                        - Return to referring physician as previously                         scheduled. Procedure Code(s):     --- Professional ---  54621, Colonoscopy, flexible; diagnostic, including                         collection of specimen(s) by brushing or washing, when                         performed (separate procedure) Diagnosis Code(s):     --- Professional ---                        K64.0, First degree hemorrhoids                        K57.30, Diverticulosis of large intestine without                         perforation or abscess without bleeding                        R93.3, Abnormal findings on diagnostic imaging of                         other parts of digestive tract CPT copyright 2022 American [employer] Association. All rights reserved. The codes documented in this report are preliminary and upon coder review may  be revised to meet current compliance requirements. Ole Schick MD, MD 03/10/2024 12:50:12 PM Number of Addenda: 0 Note Initiated On: 03/10/2024 12:25 PM Scope Withdrawal Time: 0 hours 8 minutes 40 seconds  Total Procedure Duration: 0 hours 14  minutes 6 seconds  Estimated Blood Loss:  Estimated blood loss: none.      [facility]

## 2024-03-10 NOTE — Interval H&P Note (Signed)
 History and Physical Interval Note:  03/10/2024 12:23 PM  Dawn Bruce  has presented today for surgery, with the diagnosis of Abnormal Findings of GI Tract.  The various methods of treatment have been discussed with the patient and family. After consideration of risks, benefits and other options for treatment, the patient has consented to  Procedure(s): COLONOSCOPY (N/A) as a surgical intervention.  The patient's history has been reviewed, patient examined, no change in status, stable for surgery.  I have reviewed the patient's chart and labs.  Questions were answered to the patient's satisfaction.     Ole ONEIDA Schick  Ok to proceed with colonoscopy

## 2024-03-10 NOTE — Transfer of Care (Signed)
 Immediate Anesthesia Transfer of Care Note  Patient: Dawn Bruce  Procedure(s) Performed: COLONOSCOPY  Patient Location: PACU  Anesthesia Type:General  Level of Consciousness: awake and sedated  Airway & Oxygen  Therapy: Patient Spontanous Breathing and Patient connected to nasal cannula oxygen   Post-op Assessment: Report given to RN and Post -op Vital signs reviewed and stable  Post vital signs: Reviewed and stable  Last Vitals:  Vitals Value Taken Time  BP 89/78 03/10/24 12:54  Temp 35.7 C 03/10/24 12:51  Pulse 71 03/10/24 12:55  Resp 18 03/10/24 12:55  SpO2 98 % 03/10/24 12:55  Vitals shown include unfiled device data.  Last Pain:  Vitals:   03/10/24 1251  TempSrc: Temporal         Complications: There were no known notable events for this encounter.

## 2024-03-10 NOTE — Anesthesia Preprocedure Evaluation (Addendum)
 Anesthesia Evaluation  Patient identified by MRN, date of birth, ID band Patient awake    Reviewed: Allergy & Precautions, NPO status , Patient's Chart, lab work & pertinent test results  History of Anesthesia Complications Negative for: history of anesthetic complications  Airway Mallampati: I   Neck ROM: Full    Dental no notable dental hx.    Pulmonary former smoker Pleural effusion s/p thoracentesis yielding 1.6 L fluid 04/24/23   Pulmonary exam normal breath sounds clear to auscultation       Cardiovascular hypertension, + CAD (s/p MI 04/12/23 and stents on Plavix ), + Peripheral Vascular Disease (s/p CEA) and +CHF  + dysrhythmias (a fib on Eliquis ) Atrial Fibrillation  Rhythm:Irregular Rate:Normal  ECG 04/22/23: a flutter; T abnormality; prolonged QT; no STEMI  ECHO 2024: CONCLUSION -------------------------------------------------------------------------------  NORMAL LEFT VENTRICULAR SYSTOLIC FUNCTION WITH MILD LVH  ESTIMATED EF: >55%, CALC EF(3D): 60%  ELEVATED LA PRESSURES WITH DIASTOLIC DYSFUNCTION (GRADE 2)  NORMAL RIGHT VENTRICULAR SYSTOLIC FUNCTION  VALVULAR REGURGITATION: No AR, TRIVIAL MR, No PR, TRIVIAL TR  VALVULAR STENOSIS: No AS, MILD MS, No PS, No TS     Neuro/Psych Chronic pain    GI/Hepatic ,GERD  ,,  Endo/Other  negative endocrine ROS    Renal/GU Renal InsufficiencyRenal disease (stage III CKD)     Musculoskeletal   Abdominal Normal abdominal exam  (+)   Peds  Hematology negative hematology ROS (+)   Anesthesia Other Findings   Reproductive/Obstetrics                              Anesthesia Physical Anesthesia Plan  ASA: 3  Anesthesia Plan: General   Post-op Pain Management: Minimal or no pain anticipated   Induction: Intravenous  PONV Risk Score and Plan: 3 and Propofol  infusion and TIVA  Airway Management Planned: Natural Airway  Additional  Equipment:   Intra-op Plan:   Post-operative Plan:   Informed Consent: I have reviewed the patients History and Physical, chart, labs and discussed the procedure including the risks, benefits and alternatives for the proposed anesthesia with the patient or authorized representative who has indicated his/her understanding and acceptance.     Dental advisory given  Plan Discussed with: CRNA  Anesthesia Plan Comments:          Anesthesia Quick Evaluation

## 2024-03-10 NOTE — H&P (Signed)
 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 Patient with known right hernia/abdominal wall laxity containing small bowel. Discussed with her surgeon who recommended colonoscopy before consideration of any surgical intervention. Last took plavix  5 days ago and eliquis  over 3 days ago. Father with colon cancer in his 50's/60's.    Current Facility-Administered Medications:    0.9 %  sodium chloride  infusion, , Intravenous, Continuous, Jaydyn Menon, Dawn ONEIDA, MD, Last Rate: 20 mL/hr at 03/10/24 1157, 500 mL at 03/10/24 1157  Medications Prior to Admission  Medication Sig Dispense Refill Last Dose/Taking   empagliflozin  (JARDIANCE ) 10 MG TABS tablet Take 10 mg by mouth daily.   03/09/2024   ezetimibe  (ZETIA ) 10 MG tablet TAKE 1 TABLET(10 MG) BY MOUTH DAILY 90 tablet 0 03/09/2024   pregabalin  (LYRICA ) 150 MG capsule Take 1 capsule (150 mg total) by mouth 3 (three) times daily. 270 capsule 1 03/09/2024   traZODone  (DESYREL ) 50 MG tablet Take 50 mg by mouth at bedtime as needed for sleep.   03/09/2024   apixaban  (ELIQUIS ) 5 MG TABS tablet Take 5 mg by mouth 2 (two) times daily.   03/07/2024   atorvastatin  (LIPITOR ) 40 MG tablet Take 1 tablet (40 mg total) by mouth daily. 90 tablet 1    carvedilol  (COREG ) 12.5 MG tablet Take 12.5 mg by mouth 2 (two) times daily with a meal.      clopidogrel  (PLAVIX ) 75 MG tablet Take 1 tablet (75 mg total) by mouth daily. 90 tablet 1 03/04/2024   fentaNYL  (DURAGESIC ) 75 MCG/HR Place 1 patch onto the skin every 3 (three) days. 10 patch 0 03/07/2024     No Known Allergies   Past Medical History:  Diagnosis Date   Actinic keratosis    Chronic pain    GERD (gastroesophageal reflux disease) ?   Taking  Pepcid    History of basal cell carcinoma (BCC) 05/02/2021   right upper back paraspinal   History of SCC (squamous cell carcinoma) of skin 09/10/2019   left dorsum wrist ED&C done 10/28/19   History of SCC (squamous cell carcinoma) of skin 03/04/2020   right dorsum hand   History of squamous cell carcinoma in situ (SCCIS) 03/04/2020   left dorsum wrist  ED&C   History of squamous cell carcinoma in situ (SCCIS) 03/04/2020   right medial infraorbital  ED&C 04/28/2020   History of squamous cell carcinoma in situ (SCCIS) 10/28/2019   right dorsum hand proximal lateral   History of squamous cell carcinoma in situ (SCCIS) 10/28/2019   right dorsum hand proximal medial   Hyperlipidemia    Hypertension    NSTEMI (non-ST elevated myocardial infarction) (HCC) 04/12/2023   Renal disorder    SCC (squamous cell carcinoma) 01/30/2024   R ant thigh, scheduled for excision   Squamous cell carcinoma in situ 10/31/2021   left distal  tricep, EDC   Squamous cell carcinoma of skin 08/28/2022   Right volar forearm - EDC    Review of systems:  Otherwise negative.    Physical Exam  Gen: Alert, oriented. Appears stated age.  HEENT: PERRLA. Lungs: No respiratory distress CV: RRR Abd: soft, benign, no masses Ext: No edema    Planned procedures: Proceed with colonoscopy. The patient understands the  nature of the planned procedure, indications, risks, alternatives and potential complications including but not limited to bleeding, infection, perforation, damage to internal organs and possible oversedation/side effects from anesthesia. The patient agrees and gives consent to proceed.  Please refer to procedure notes for findings, recommendations and patient disposition/instructions.     Dawn ONEIDA Schick, MD [facility]

## 2024-03-11 ENCOUNTER — Ambulatory Visit (INDEPENDENT_AMBULATORY_CARE_PROVIDER_SITE_OTHER)

## 2024-03-11 ENCOUNTER — Telehealth: Payer: Self-pay

## 2024-03-11 DIAGNOSIS — C44321 Squamous cell carcinoma of skin of nose: Secondary | ICD-10-CM | POA: Diagnosis not present

## 2024-03-11 DIAGNOSIS — C44722 Squamous cell carcinoma of skin of right lower limb, including hip: Secondary | ICD-10-CM

## 2024-03-11 DIAGNOSIS — C4492 Squamous cell carcinoma of skin, unspecified: Secondary | ICD-10-CM

## 2024-03-11 DIAGNOSIS — D485 Neoplasm of uncertain behavior of skin: Secondary | ICD-10-CM

## 2024-03-11 NOTE — Patient Instructions (Addendum)

## 2024-03-11 NOTE — Progress Notes (Signed)
 Subjective   Dawn Bruce is a 78 y.o. female who presents for the following: EDC of SCC on right anterior thigh and biopsy on nasal tip. Patient is established patient   Today patient reports: Area of concern on her nose.   Review of Systems:    No other skin or systemic complaints except as noted in HPI or Assessment and Plan.  The following portions of the chart were reviewed this encounter and updated as appropriate: medications, allergies, medical history  Relevant Medical History:  Personal history of non melanoma skin cancer - see medical history for full details   Objective  Well appearing patient in no apparent distress; mood and affect are within normal limits. Examination was performed of the: Right anterior thigh  Examination notable for: below Right Thigh - Anterior Keratotic pink papule/nodule or plaque.  Nasal tip 7mm pink pearly ulcerated papule   Assessment & Plan   Well differentiated SCC of R anterior thigh - Discussed risk / benefits of excision vs ED&C. Advised higher risk of recurrence and unable to check margins w/ ED&C - Agrees to [facility]   LOC R nasal tip - BCC vs other - procedure note below   Procedures, orders, diagnosis for this visit:  SQUAMOUS CELL CARCINOMA OF SKIN Right Thigh - Anterior Destruction of lesion Complexity: simple   Destruction method: electrodesiccation and curettage   Informed consent: discussed and consent obtained   Timeout:  patient name, date of birth, surgical site, and procedure verified Procedure prep:  Patient was prepped and draped in usual sterile fashion Prep type:  Isopropyl alcohol  Anesthesia: the lesion was anesthetized in a standard fashion   Anesthetic:  1% lidocaine  w/ epinephrine  1-100,000 buffered w/ 8.4% NaHCO3 Curettage performed in three different directions: Yes   Electrodesiccation performed over the curetted area: Yes   Curettage cycles:  3 Lesion length (cm):  0.9 Lesion width (cm):  0.5 Margin  per side (cm):  0.4 Final wound size (cm):  1.7 Hemostasis achieved with:  aluminum chloride and electrodesiccation Outcome: patient tolerated procedure well with no complications   Post-procedure details: sterile dressing applied and wound care instructions given   Dressing type: petrolatum    NEOPLASM OF UNCERTAIN BEHAVIOR OF SKIN Nasal tip Skin / nail biopsy Type of biopsy: tangential   Informed consent: discussed and consent obtained   Timeout: patient name, date of birth, surgical site, and procedure verified   Procedure prep:  Patient was prepped and draped in usual sterile fashion Prep type:  Isopropyl alcohol  Anesthesia: the lesion was anesthetized in a standard fashion   Anesthetic:  1% lidocaine  w/ epinephrine  1-100,000 buffered w/ 8.4% NaHCO3 Instrument used: DermaBlade   Hemostasis achieved with: pressure and aluminum chloride   Outcome: patient tolerated procedure well   Post-procedure details: sterile dressing applied and wound care instructions given   Dressing type: bandage and petrolatum    Specimen 2 - Surgical pathology Differential Diagnosis: BCC vs other   Check Margins: No  Squamous cell carcinoma of skin -     Destruction of lesion  Neoplasm of uncertain behavior of skin -     Skin / nail biopsy -     Surgical pathology; Standing    Return to clinic: Return 7-10 days, for Suture removal.  I, Emerick Ege, CMA am acting as scribe for Lauraine JAYSON Kanaris, MD.   Documentation: I have reviewed the above documentation for accuracy and completeness, and I agree with the above.  Lauraine JAYSON Kanaris, MD

## 2024-03-11 NOTE — Telephone Encounter (Signed)
 Called patient to follow up on her after biopsy and ED&C today. Patient states she is doing well and neither location bled after she left today. Patient denied complaints or questions.

## 2024-03-13 ENCOUNTER — Ambulatory Visit: Payer: Self-pay

## 2024-03-13 DIAGNOSIS — C4492 Squamous cell carcinoma of skin, unspecified: Secondary | ICD-10-CM

## 2024-03-13 LAB — SURGICAL PATHOLOGY

## 2024-03-13 NOTE — Telephone Encounter (Signed)
-----   Message from Lauraine JAYSON Kanaris sent at 03/13/2024  3:11 PM EDT -----  1. Skin, nasal tip :       WELL DIFFERENTIATED SQUAMOUS CELL CARCINOMA   Please notify patient with below plan: Mohs  ----- Message ----- From: Interface, Lab In Three Zero One Sent: 03/13/2024   2:48 PM EDT To: Lauraine JAYSON Kanaris, MD

## 2024-03-13 NOTE — Telephone Encounter (Signed)
 Advised pt of bx results.  Discussed mohs.  Patient prefers referral to be sent to Dr. Corey.  Referral placed./sh

## 2024-03-19 NOTE — Anesthesia Postprocedure Evaluation (Signed)
 Anesthesia Post Note  Patient: Ronal Silvan  Procedure(s) Performed: COLONOSCOPY  Patient location during evaluation: Endoscopy Anesthesia Type: General Level of consciousness: awake and alert Pain management: pain level controlled Vital Signs Assessment: post-procedure vital signs reviewed and stable Respiratory status: spontaneous breathing, nonlabored ventilation, respiratory function stable and patient connected to nasal cannula oxygen  Cardiovascular status: blood pressure returned to baseline and stable Postop Assessment: no apparent nausea or vomiting Anesthetic complications: no   There were no known notable events for this encounter.   Last Vitals:  Vitals:   03/10/24 1303 03/10/24 1313  BP: (!) 110/53 124/61  Pulse: 67 61  Resp: 17 16  Temp:    SpO2: 99% 98%    Last Pain:  Vitals:   03/10/24 1313  TempSrc:   PainSc: 0-No pain                 Prentice Murphy

## 2024-03-20 ENCOUNTER — Ambulatory Visit

## 2024-04-01 ENCOUNTER — Encounter: Admitting: Dermatology

## 2024-04-07 ENCOUNTER — Ambulatory Visit: Admitting: Dermatology

## 2024-04-07 ENCOUNTER — Encounter: Payer: Self-pay | Admitting: Dermatology

## 2024-04-07 VITALS — BP 118/76 | HR 71 | Temp 97.9°F

## 2024-04-07 DIAGNOSIS — C44321 Squamous cell carcinoma of skin of nose: Secondary | ICD-10-CM | POA: Diagnosis not present

## 2024-04-07 DIAGNOSIS — L578 Other skin changes due to chronic exposure to nonionizing radiation: Secondary | ICD-10-CM | POA: Diagnosis not present

## 2024-04-07 DIAGNOSIS — C4492 Squamous cell carcinoma of skin, unspecified: Secondary | ICD-10-CM

## 2024-04-07 DIAGNOSIS — L814 Other melanin hyperpigmentation: Secondary | ICD-10-CM

## 2024-04-07 MED ORDER — MUPIROCIN 2 % EX OINT: 1.0000 | TOPICAL_OINTMENT | Freq: Two times a day (BID) | CUTANEOUS | 1 refills | Status: AC

## 2024-04-07 MED ORDER — OXYCODONE HCL 5 MG PO TABS: 5.0000 mg | ORAL_TABLET | Freq: Four times a day (QID) | ORAL | 0 refills | Status: AC | PRN

## 2024-04-07 NOTE — Progress Notes (Signed)
 Follow-Up Visit   Subjective  Dawn Bruce is a 78 y.o. 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 Raymund.   The following portions of the chart were reviewed this encounter and updated as appropriate: medications, allergies, medical history  Review of Systems:  No other skin or systemic complaints except as noted in HPI or Assessment and Plan.  Objective  Well appearing patient in no apparent distress; mood and affect are within normal limits.  A focused examination was performed of the following areas: Nasal tip Relevant physical exam findings are noted in the Assessment and Plan.   nasal tip Hyperkeratotic papule   Assessment & Plan   SQUAMOUS CELL CARCINOMA OF SKIN nasal tip Mohs surgery  Consent obtained: written  Anticoagulation: Was the anticoagulation regimen changed prior to Mohs? No    Anesthesia: Anesthesia method: local infiltration Local anesthetic: lidocaine  1% WITH epi  Procedure Details: Timeout: pre-procedure verification complete Procedure Prep: patient was prepped and draped in usual sterile fashion Prep type: chlorhexidine  Biopsy accession number: [id number] Pre-Op diagnosis: squamous cell carcinoma SCC subtype: well differentiated MohsAIQ Surgical site (if tumor spans multiple areas, please select predominant area): nose Surgery side: midline Surgical site (from skin exam): nasal tip Pre-operative length (cm): 0.5 Pre-operative width (cm): 0.5 Indications for Mohs surgery: anatomic location where tissue conservation is critical  Micrographic Surgery Details: Post-operative length (cm): 1 Post-operative width (cm): 0.9 Number of Mohs stages: 1 Post surgery depth of defect: skeletal muscle  Stage 1    Tumor features identified on Mohs section: no tumor identified  Reconstruction: Was the defect reconstructed? Yes   Was reconstruction performed by the  same Mohs surgeon? Yes   Setting of reconstruction: outpatient office When was reconstruction performed? same day Type of reconstruction: flap Type of flap: advancement   Other advancement flap type: east to west  Opioids: Did the patient receive a prescription for opioid/narcotic related to Mohs surgery? Yes    Related Medications oxyCODONE  (OXY IR/ROXICODONE ) 5 MG immediate release tablet Take 1 tablet (5 mg total) by mouth every 6 (six) hours as needed for up to 8 doses.   Return in about 4 weeks (around 05/05/2024) for wound check, 1 wk wound check/suture removal.  I, [name], CMA, am acting as scribe for [name], MD.    04/07/2024  HISTORY OF PRESENT ILLNESS  Dawn Bruce is seen in consultation at the request of Dr. 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 They note that the area has been present for about 3 months increasing in size with time.  There is no history of previous treatment.  Reports no other new or changing lesions and has no other complaints today.  Medications and allergies: see patient chart.  Review of systems: Reviewed 8 systems and notable for the above skin cancer.  All other systems reviewed are unremarkable/negative, unless noted in the HPI. Past medical history, surgical history, family history, social history were also reviewed and are noted in the chart/questionnaire.    PHYSICAL EXAMINATION  General: Well-appearing, in no acute distress, alert and oriented x 4. Vitals reviewed in chart (if available).   Skin: Exam reveals a 0.5 x 0.5 cm erythematous papule and biopsy scar on the nasal tip. There are rhytids, telangiectasias, and lentigines, consistent with photodamage.  Biopsy report(s) reviewed, confirming the diagnosis.   ASSESSMENT  1) Well Differentiated Squamous Cell Carcinoma of the  nasal tip 2) photodamage 3) solar lentigines   PLAN   1. Due to location, size, histology, or  recurrence and the likelihood of subclinical extension as well as the need to conserve normal surrounding tissue, the patient was deemed acceptable for Mohs micrographic surgery (MMS).  The nature and purpose of the procedure, associated benefits and risks including recurrence and scarring, possible complications such as pain, infection, and bleeding, and alternative methods of treatment if appropriate were discussed with the patient during consent. The lesion location was verified by the patient, by reviewing previous notes, pathology reports, and by photographs as well as angulation measurements if available.  Informed consent was reviewed and signed by the patient, and timeout was performed at 9:00 AM. See op note below.  2. For the photodamage and solar lentigines, sun protection discussed/information given on OTC sunscreens, and we recommend continued regular follow-up with primary dermatologist every 6 months or sooner for any growing, bleeding, or changing lesions. 3. Prognosis and future surveillance discussed. 4. Letter with treatment outcome sent to referring provider. 5. Pain acetaminophen /ibuprofen /oxycodone  5 mg   MOHS MICROGRAPHIC SURGERY AND RECONSTRUCTION  Initial size:   0.5 x 0.5 cm Surgical defect/wound size: 1.0 x 0.9 cm Anesthesia:    0.33% lidocaine  with 1:200,000 epinephrine  EBL:    <5 mL Complications:  None Repair type:   Adjacent Tissue Transfer (East to [facility] Flap) SQ suture:   5-0 Vicryl Cutaneous suture:  5-0 Polyprolene Final size of the repair: 2.2 x 1.0 = 2.2 cm^2  Stages: 1  STAGE I: Anesthesia achieved with 0.5% lidocaine  with 1:200,000 epinephrine . ChloraPrep applied. 1 section(s) excised using Mohs technique (this includes total peripheral and deep tissue margin excision and evaluation with frozen sections, excised and interpreted by the same physician). The tumor was first debulked and then excised with an approx. 2mm margin.  Hemostasis was  achieved with electrocautery as needed.  The specimen was then oriented, subdivided/relaxed, inked, and processed using Mohs technique.    Frozen section analysis revealed a clear deep and peripheral margin.  Reconstruction  PROCEDURE: Advancement Flap The nature of the procedure was discussed with the patient in detail, including alternatives.  The risks discussed included but not limited to potential for infection, bleeding, scar formation, and damage to underlying structures.  The patient understood the risks and signed the consent form (scanned into chart).  This wound was reconstructed with an advancement flap.  Local anesthesia was achieved with the anesthetic indicated above.  The operative site was prepped with a surgical antiseptic solution, and then draped with sterile towels to insure a sterile field.  The beveled edges of the wound were excised to 90 degrees relative to the surface skin plane.  The wound was undermined in all directions, and meticulous hemostasis was achieved with an electrosurgical device.  Relaxing incisions were made, if necessary, for lateral tension release.  The tissue was advanced and closed centrally, and redundant tissue was trimmed as necessary.  The wound was sutured in a layered fashion to close potential dead space and to precisely and securely approximate the wound edges.  The dimensions of the flap were:  2.2  cm x 1.0 cm for a total flap surface area of 2.2 centimeters squared (cm2).    The wound was covered with petrolatum and a dressing.  The patient understands the need to return immediately for any signs of infection to include swelling, pain, purulent discharge, localized warmth, or fever.  Contact information was provided to the patient (  including after-hours pager numbers)     Documentation: I have reviewed the above documentation for accuracy and completeness, and I agree with the above.  [name], MD

## 2024-04-07 NOTE — Patient Instructions (Signed)

## 2024-04-15 ENCOUNTER — Encounter: Payer: Self-pay | Admitting: Dermatology

## 2024-04-15 ENCOUNTER — Ambulatory Visit: Admitting: Dermatology

## 2024-04-15 VITALS — BP 122/65 | HR 67

## 2024-04-15 DIAGNOSIS — C4492 Squamous cell carcinoma of skin, unspecified: Secondary | ICD-10-CM

## 2024-04-15 DIAGNOSIS — L539 Erythematous condition, unspecified: Secondary | ICD-10-CM

## 2024-04-15 DIAGNOSIS — T1490XD Injury, unspecified, subsequent encounter: Secondary | ICD-10-CM

## 2024-04-15 DIAGNOSIS — Z85828 Personal history of other malignant neoplasm of skin: Secondary | ICD-10-CM

## 2024-04-15 NOTE — Patient Instructions (Signed)

## 2024-04-15 NOTE — Progress Notes (Signed)
   Follow Up Visit   Subjective  Dawn Bruce is a 78 y.o. female who presents for the following: follow up from Mohs surgery   The patient presents for follow up from Mohs surgery for a SCC on the nasal tip, treated on 04/07/24, repaired with advancement flap. The patient has been bandaging the wound as directed. The endorse the following concerns: none  The following portions of the chart were reviewed this encounter and updated as appropriate: medications, allergies, medical history  Review of Systems:  No other skin or systemic complaints except as noted in HPI or Assessment and Plan.  Objective  Well appearing patient in no apparent distress; mood and affect are within normal limits.  A focal examination was performed including scalp, head, face. All findings within normal limits unless otherwise noted below.  Healing wound with mild erythema     Relevant physical exam findings are noted in the Assessment and Plan.    Assessment & Plan   Healing Wound s/p Mohs for SCC on the nasal tip, treated on 04/07/24, repaired with advancement flap - Reassured that wound is healing well - No evidence of infection - No swelling, induration, purulence, dehiscence, or tenderness out of proportion to the clinical exam, see photo above - Discussed that scars take up to 12 months to mature from the date of surgery - Recommend SPF 30+ to scar daily to prevent purple color from UV exposure during scar maturation process - Discussed that erythema and raised appearance of scar will fade over the next 4-6 months - OK to start scar massage at 4-6 weeks post-op - Can consider silicone based products for scar healing starting at 6 weeks post-op - Ok to continue ointment daily to wound under a bandage for another week  HISTORY OF SQUAMOUS CELL CARCINOMA OF THE SKIN - No evidence of recurrence today - Recommend regular full body skin exams - Recommend daily broad spectrum sunscreen SPF 30+ to  sun-exposed areas, reapply every 2 hours as needed.  - Call if any new or changing lesions are noted between office visits  Return in about 3 weeks (around 05/06/2024) for wound check.  I, [name], CMA, am acting as scribe for [name], MD.   Documentation: I have reviewed the above documentation for accuracy and completeness, and I agree with the above.  [name], MD

## 2024-04-30 ENCOUNTER — Encounter: Payer: Self-pay | Admitting: Dermatology

## 2024-05-06 ENCOUNTER — Encounter: Payer: Self-pay | Admitting: Dermatology

## 2024-05-06 ENCOUNTER — Ambulatory Visit: Admitting: Dermatology

## 2024-05-06 DIAGNOSIS — C4492 Squamous cell carcinoma of skin, unspecified: Secondary | ICD-10-CM

## 2024-05-06 DIAGNOSIS — T1490XD Injury, unspecified, subsequent encounter: Secondary | ICD-10-CM

## 2024-05-06 NOTE — Patient Instructions (Addendum)

## 2024-05-06 NOTE — Progress Notes (Signed)
° °  Follow Up Visit   Subjective  Rianne Degraaf is a 78 y.o. female who presents for the following: follow up from Mohs surgery   The patient presents for follow up from Mohs surgery for a SCC on the nasal tip, treated on 04/07/24, repaired with advancement flap. The patient has been bandaging the wound as directed. The endorse the following concerns: none  The following portions of the chart were reviewed this encounter and updated as appropriate: medications, allergies, medical history  Review of Systems:  No other skin or systemic complaints except as noted in HPI or Assessment and Plan.  Objective  Well appearing patient in no apparent distress; mood and affect are within normal limits.  A focal examination was performed including scalp, head, face. All findings within normal limits unless otherwise noted below.  Healing wound with mild erythema  Relevant physical exam findings are noted in the Assessment and Plan.    Assessment & Plan   Healing Wound s/p Mohs for SCC, treated on 04/07/24, repaired with advancement flap - Reassured that wound is healing well - Spitting sutures removed - No evidence of infection - No swelling, induration, purulence, dehiscence, or tenderness out of proportion to the clinical exam, see photo above - Discussed that scars take up to 12 months to mature from the date of surgery - Recommend SPF 30+ to scar daily to prevent purple color from UV exposure during scar maturation process - Discussed that erythema and raised appearance of scar will fade over the next 4-6 months - OK to start scar massage at 4-6 weeks post-op - Can consider silicone based products for scar healing starting at 6 weeks post-op - Ok to continue ointment daily to wound under a bandage for another week  HISTORY OF SQUAMOUS CELL CARCINOMA OF THE SKIN - No evidence of recurrence today - Recommend regular full body skin exams - Recommend daily broad spectrum sunscreen SPF 30+ to  sun-exposed areas, reapply every 2 hours as needed.  - Call if any new or changing lesions are noted between office visits  Return if symptoms worsen or fail to improve.  I, [name], CMA, am acting as scribe for [name], MD.   Documentation: I have reviewed the above documentation for accuracy and completeness, and I agree with the above.  [name], MD

## 2024-06-10 ENCOUNTER — Ambulatory Visit: Admitting: Dermatology

## 2024-10-29 ENCOUNTER — Encounter
